# Patient Record
Sex: Female | Born: 1970 | Race: White | Hispanic: No | Marital: Married | Smoking: Former smoker
Health system: Southern US, Community
[De-identification: ages and names within clinical notes are randomized; demographics above are authoritative.]

## PROBLEM LIST (undated history)

## (undated) DIAGNOSIS — I471 Supraventricular tachycardia, unspecified: Secondary | ICD-10-CM

## (undated) DIAGNOSIS — M79641 Pain in right hand: Secondary | ICD-10-CM

## (undated) DIAGNOSIS — F419 Anxiety disorder, unspecified: Secondary | ICD-10-CM

## (undated) DIAGNOSIS — M4800 Spinal stenosis, site unspecified: Secondary | ICD-10-CM

## (undated) DIAGNOSIS — M1990 Unspecified osteoarthritis, unspecified site: Secondary | ICD-10-CM

## (undated) DIAGNOSIS — M25562 Pain in left knee: Secondary | ICD-10-CM

## (undated) DIAGNOSIS — G43119 Migraine with aura, intractable, without status migrainosus: Secondary | ICD-10-CM

## (undated) DIAGNOSIS — R5382 Chronic fatigue, unspecified: Secondary | ICD-10-CM

## (undated) DIAGNOSIS — M25561 Pain in right knee: Secondary | ICD-10-CM

## (undated) DIAGNOSIS — E119 Type 2 diabetes mellitus without complications: Secondary | ICD-10-CM

## (undated) DIAGNOSIS — M4646 Discitis, unspecified, lumbar region: Secondary | ICD-10-CM

## (undated) DIAGNOSIS — G8918 Other acute postprocedural pain: Secondary | ICD-10-CM

## (undated) DIAGNOSIS — R42 Dizziness and giddiness: Secondary | ICD-10-CM

## (undated) DIAGNOSIS — G4730 Sleep apnea, unspecified: Secondary | ICD-10-CM

## (undated) DIAGNOSIS — M797 Fibromyalgia: Secondary | ICD-10-CM

## (undated) DIAGNOSIS — R600 Localized edema: Secondary | ICD-10-CM

## (undated) DIAGNOSIS — E559 Vitamin D deficiency, unspecified: Secondary | ICD-10-CM

## (undated) DIAGNOSIS — M719 Bursopathy, unspecified: Secondary | ICD-10-CM

## (undated) DIAGNOSIS — G43909 Migraine, unspecified, not intractable, without status migrainosus: Secondary | ICD-10-CM

## (undated) DIAGNOSIS — G9332 Myalgic encephalomyelitis/chronic fatigue syndrome: Secondary | ICD-10-CM

## (undated) DIAGNOSIS — K219 Gastro-esophageal reflux disease without esophagitis: Secondary | ICD-10-CM

## (undated) DIAGNOSIS — K589 Irritable bowel syndrome without diarrhea: Secondary | ICD-10-CM

## (undated) HISTORY — DX: Pain in left knee: M25.562

## (undated) HISTORY — DX: Supraventricular tachycardia, unspecified: I47.10

## (undated) HISTORY — DX: Supraventricular tachycardia: I47.1

## (undated) HISTORY — DX: Irritable bowel syndrome, unspecified: K58.9

## (undated) HISTORY — PX: KNEE ARTHROSCOPY: SUR90

## (undated) HISTORY — DX: Anxiety disorder, unspecified: F41.9

## (undated) HISTORY — DX: Pain in right hand: M79.641

## (undated) HISTORY — DX: Bursopathy, unspecified: M71.9

## (undated) HISTORY — DX: Discitis, unspecified, lumbar region: M46.46

## (undated) HISTORY — DX: Fibromyalgia: M79.7

## (undated) HISTORY — DX: Spinal stenosis, site unspecified: M48.00

## (undated) HISTORY — PX: CARDIAC CATHETERIZATION: SHX172

## (undated) HISTORY — DX: Localized edema: R60.0

## (undated) HISTORY — PX: ABLATION: SHX5711

## (undated) HISTORY — DX: Dizziness and giddiness: R42

## (undated) HISTORY — DX: Vitamin D deficiency, unspecified: E55.9

## (undated) HISTORY — DX: Migraine, unspecified, not intractable, without status migrainosus: G43.909

## (undated) HISTORY — DX: Gastro-esophageal reflux disease without esophagitis: K21.9

## (undated) HISTORY — DX: Sleep apnea, unspecified: G47.30

## (undated) HISTORY — DX: Pain in right knee: M25.561

## (undated) HISTORY — DX: Unspecified osteoarthritis, unspecified site: M19.90

## (undated) HISTORY — PX: OTHER SURGICAL HISTORY: SHX169

## (undated) HISTORY — DX: Migraine with aura, intractable, without status migrainosus: G43.119

## (undated) HISTORY — DX: Chronic fatigue, unspecified: R53.82

## (undated) HISTORY — PX: BREAST BIOPSY: SHX20

## (undated) HISTORY — DX: Myalgic encephalomyelitis/chronic fatigue syndrome: G93.32

## (undated) HISTORY — DX: Other acute postprocedural pain: G89.18

---

## 1996-09-30 HISTORY — PX: OTHER SURGICAL HISTORY: SHX169

## 1999-02-06 ENCOUNTER — Emergency Department (HOSPITAL_COMMUNITY): Admission: EM | Admit: 1999-02-06 | Discharge: 1999-02-06 | Payer: Self-pay | Admitting: Emergency Medicine

## 1999-10-01 HISTORY — PX: TUBAL LIGATION: SHX77

## 2002-08-16 ENCOUNTER — Encounter: Payer: Self-pay | Admitting: Emergency Medicine

## 2002-08-16 ENCOUNTER — Emergency Department (HOSPITAL_COMMUNITY): Admission: EM | Admit: 2002-08-16 | Discharge: 2002-08-16 | Payer: Self-pay | Admitting: Emergency Medicine

## 2004-09-04 ENCOUNTER — Emergency Department (HOSPITAL_COMMUNITY): Admission: EM | Admit: 2004-09-04 | Discharge: 2004-09-04 | Payer: Self-pay | Admitting: Family Medicine

## 2004-09-10 ENCOUNTER — Ambulatory Visit (HOSPITAL_COMMUNITY): Admission: RE | Admit: 2004-09-10 | Discharge: 2004-09-10 | Payer: Self-pay

## 2004-11-25 ENCOUNTER — Emergency Department (HOSPITAL_COMMUNITY)
Admission: EM | Admit: 2004-11-25 | Discharge: 2004-11-25 | Payer: No Typology Code available for payment source | Admitting: Family Medicine

## 2005-02-07 ENCOUNTER — Ambulatory Visit: Payer: Self-pay | Admitting: Orthopedic Surgery

## 2005-02-21 ENCOUNTER — Encounter: Admission: RE | Admit: 2005-02-21 | Discharge: 2005-02-21 | Payer: Self-pay | Admitting: Orthopedic Surgery

## 2005-03-13 ENCOUNTER — Encounter: Admission: RE | Admit: 2005-03-13 | Discharge: 2005-03-13 | Payer: Self-pay | Admitting: Orthopedic Surgery

## 2005-03-21 ENCOUNTER — Ambulatory Visit: Payer: Self-pay | Admitting: Orthopedic Surgery

## 2005-03-28 ENCOUNTER — Encounter: Admission: RE | Admit: 2005-03-28 | Discharge: 2005-03-28 | Payer: Self-pay | Admitting: Orthopedic Surgery

## 2005-04-11 ENCOUNTER — Ambulatory Visit: Payer: Self-pay | Admitting: Orthopedic Surgery

## 2006-07-04 ENCOUNTER — Encounter: Admission: RE | Admit: 2006-07-04 | Discharge: 2006-07-04 | Payer: Self-pay | Admitting: Internal Medicine

## 2006-08-07 ENCOUNTER — Encounter: Admission: RE | Admit: 2006-08-07 | Discharge: 2006-08-07 | Payer: Self-pay | Admitting: Internal Medicine

## 2006-09-02 ENCOUNTER — Encounter: Admission: RE | Admit: 2006-09-02 | Discharge: 2006-09-02 | Payer: Self-pay | Admitting: Internal Medicine

## 2007-05-06 ENCOUNTER — Emergency Department (HOSPITAL_COMMUNITY): Admission: EM | Admit: 2007-05-06 | Discharge: 2007-05-06 | Payer: Self-pay | Admitting: Emergency Medicine

## 2007-05-15 DIAGNOSIS — R51 Headache: Secondary | ICD-10-CM

## 2007-05-15 DIAGNOSIS — R519 Headache, unspecified: Secondary | ICD-10-CM | POA: Insufficient documentation

## 2008-06-13 ENCOUNTER — Ambulatory Visit: Payer: Self-pay | Admitting: Nurse Practitioner

## 2008-06-13 DIAGNOSIS — K219 Gastro-esophageal reflux disease without esophagitis: Secondary | ICD-10-CM

## 2008-06-17 DIAGNOSIS — G4733 Obstructive sleep apnea (adult) (pediatric): Secondary | ICD-10-CM | POA: Insufficient documentation

## 2008-06-17 DIAGNOSIS — Z8679 Personal history of other diseases of the circulatory system: Secondary | ICD-10-CM | POA: Insufficient documentation

## 2008-06-17 DIAGNOSIS — R42 Dizziness and giddiness: Secondary | ICD-10-CM | POA: Insufficient documentation

## 2008-06-20 ENCOUNTER — Encounter (INDEPENDENT_AMBULATORY_CARE_PROVIDER_SITE_OTHER): Payer: Self-pay | Admitting: Nurse Practitioner

## 2008-06-23 ENCOUNTER — Encounter (INDEPENDENT_AMBULATORY_CARE_PROVIDER_SITE_OTHER): Payer: Self-pay | Admitting: Nurse Practitioner

## 2008-06-23 DIAGNOSIS — I10 Essential (primary) hypertension: Secondary | ICD-10-CM | POA: Insufficient documentation

## 2008-06-23 DIAGNOSIS — F410 Panic disorder [episodic paroxysmal anxiety] without agoraphobia: Secondary | ICD-10-CM

## 2008-12-21 ENCOUNTER — Ambulatory Visit: Payer: Self-pay | Admitting: Nurse Practitioner

## 2008-12-21 ENCOUNTER — Ambulatory Visit (HOSPITAL_COMMUNITY): Admission: RE | Admit: 2008-12-21 | Discharge: 2008-12-21 | Payer: Self-pay | Admitting: Nurse Practitioner

## 2008-12-21 DIAGNOSIS — M25561 Pain in right knee: Secondary | ICD-10-CM

## 2008-12-21 DIAGNOSIS — M25562 Pain in left knee: Secondary | ICD-10-CM

## 2008-12-21 HISTORY — DX: Pain in right knee: M25.561

## 2008-12-21 HISTORY — DX: Pain in right knee: M25.562

## 2008-12-23 ENCOUNTER — Encounter (INDEPENDENT_AMBULATORY_CARE_PROVIDER_SITE_OTHER): Payer: Self-pay | Admitting: Nurse Practitioner

## 2009-01-12 ENCOUNTER — Ambulatory Visit: Payer: Self-pay | Admitting: Nurse Practitioner

## 2009-01-12 ENCOUNTER — Other Ambulatory Visit: Admission: RE | Admit: 2009-01-12 | Discharge: 2009-01-12 | Payer: Self-pay | Admitting: Internal Medicine

## 2009-01-12 ENCOUNTER — Encounter (INDEPENDENT_AMBULATORY_CARE_PROVIDER_SITE_OTHER): Payer: Self-pay | Admitting: Nurse Practitioner

## 2009-01-12 DIAGNOSIS — R079 Chest pain, unspecified: Secondary | ICD-10-CM

## 2009-01-12 DIAGNOSIS — F3340 Major depressive disorder, recurrent, in remission, unspecified: Secondary | ICD-10-CM

## 2009-01-12 LAB — CONVERTED CEMR LAB

## 2009-01-13 ENCOUNTER — Encounter (INDEPENDENT_AMBULATORY_CARE_PROVIDER_SITE_OTHER): Payer: Self-pay | Admitting: Nurse Practitioner

## 2009-01-20 ENCOUNTER — Encounter: Admission: RE | Admit: 2009-01-20 | Discharge: 2009-01-20 | Payer: Self-pay | Admitting: Internal Medicine

## 2009-01-24 ENCOUNTER — Telehealth (INDEPENDENT_AMBULATORY_CARE_PROVIDER_SITE_OTHER): Payer: Self-pay | Admitting: Nurse Practitioner

## 2009-01-30 ENCOUNTER — Ambulatory Visit: Payer: Self-pay | Admitting: Nurse Practitioner

## 2009-01-30 DIAGNOSIS — N943 Premenstrual tension syndrome: Secondary | ICD-10-CM | POA: Insufficient documentation

## 2009-01-31 ENCOUNTER — Encounter: Admission: RE | Admit: 2009-01-31 | Discharge: 2009-01-31 | Payer: Self-pay | Admitting: Internal Medicine

## 2009-02-02 ENCOUNTER — Encounter (INDEPENDENT_AMBULATORY_CARE_PROVIDER_SITE_OTHER): Payer: Self-pay | Admitting: Nurse Practitioner

## 2009-06-09 ENCOUNTER — Telehealth (INDEPENDENT_AMBULATORY_CARE_PROVIDER_SITE_OTHER): Payer: Self-pay | Admitting: *Deleted

## 2009-06-09 ENCOUNTER — Ambulatory Visit: Payer: Self-pay | Admitting: Internal Medicine

## 2009-06-09 DIAGNOSIS — L259 Unspecified contact dermatitis, unspecified cause: Secondary | ICD-10-CM | POA: Insufficient documentation

## 2009-06-15 ENCOUNTER — Ambulatory Visit: Payer: Self-pay | Admitting: Nurse Practitioner

## 2009-06-15 DIAGNOSIS — N760 Acute vaginitis: Secondary | ICD-10-CM | POA: Insufficient documentation

## 2009-06-15 DIAGNOSIS — R3129 Other microscopic hematuria: Secondary | ICD-10-CM | POA: Insufficient documentation

## 2009-06-15 LAB — CONVERTED CEMR LAB
Bilirubin Urine: NEGATIVE
Blood Glucose, Fingerstick: 104
Glucose, Urine, Semiquant: NEGATIVE
Ketones, urine, test strip: NEGATIVE
Specific Gravity, Urine: 1.01

## 2009-06-16 ENCOUNTER — Encounter (INDEPENDENT_AMBULATORY_CARE_PROVIDER_SITE_OTHER): Payer: Self-pay | Admitting: Nurse Practitioner

## 2009-06-22 ENCOUNTER — Ambulatory Visit: Payer: Self-pay | Admitting: Nurse Practitioner

## 2009-06-30 ENCOUNTER — Encounter (INDEPENDENT_AMBULATORY_CARE_PROVIDER_SITE_OTHER): Payer: Self-pay | Admitting: Nurse Practitioner

## 2009-06-30 ENCOUNTER — Ambulatory Visit: Payer: Self-pay | Admitting: Physician Assistant

## 2009-06-30 DIAGNOSIS — J069 Acute upper respiratory infection, unspecified: Secondary | ICD-10-CM | POA: Insufficient documentation

## 2009-06-30 LAB — CONVERTED CEMR LAB: Rapid Strep: NEGATIVE

## 2009-08-18 ENCOUNTER — Ambulatory Visit: Payer: Self-pay | Admitting: Internal Medicine

## 2009-08-18 ENCOUNTER — Telehealth (INDEPENDENT_AMBULATORY_CARE_PROVIDER_SITE_OTHER): Payer: Self-pay | Admitting: Internal Medicine

## 2009-08-18 DIAGNOSIS — J019 Acute sinusitis, unspecified: Secondary | ICD-10-CM | POA: Insufficient documentation

## 2009-09-01 ENCOUNTER — Encounter: Admission: RE | Admit: 2009-09-01 | Discharge: 2009-09-01 | Payer: Self-pay | Admitting: Internal Medicine

## 2009-12-12 ENCOUNTER — Encounter (INDEPENDENT_AMBULATORY_CARE_PROVIDER_SITE_OTHER): Payer: Self-pay | Admitting: Internal Medicine

## 2010-03-28 ENCOUNTER — Encounter: Admission: RE | Admit: 2010-03-28 | Discharge: 2010-04-13 | Payer: Self-pay | Admitting: Family Medicine

## 2010-04-23 ENCOUNTER — Observation Stay (HOSPITAL_COMMUNITY): Admission: EM | Admit: 2010-04-23 | Discharge: 2010-04-23 | Payer: Self-pay | Admitting: Emergency Medicine

## 2010-06-21 ENCOUNTER — Encounter (INDEPENDENT_AMBULATORY_CARE_PROVIDER_SITE_OTHER): Payer: Self-pay | Admitting: Internal Medicine

## 2010-10-28 LAB — CONVERTED CEMR LAB
Albumin: 4.4 g/dL (ref 3.5–5.2)
Basophils Relative: 0 % (ref 0–1)
CO2: 24 meq/L (ref 19–32)
Chlamydia, DNA Probe: NEGATIVE
Cholesterol: 200 mg/dL (ref 0–200)
Glucose, Bld: 88 mg/dL (ref 70–99)
HCT: 44.9 % (ref 36.0–46.0)
Hemoglobin: 14.9 g/dL (ref 12.0–15.0)
LDL Cholesterol: 101 mg/dL — ABNORMAL HIGH (ref 0–99)
Lymphocytes Relative: 10 % — ABNORMAL LOW (ref 12–46)
MCHC: 33.2 g/dL (ref 30.0–36.0)
MCV: 93 fL (ref 78.0–100.0)
Monocytes Absolute: 1.1 10*3/uL — ABNORMAL HIGH (ref 0.1–1.0)
Monocytes Relative: 8 % (ref 3–12)
Neutro Abs: 10.9 10*3/uL — ABNORMAL HIGH (ref 1.7–7.7)
Neutrophils Relative %: 80 % — ABNORMAL HIGH (ref 43–77)
Nitrite: NEGATIVE
RBC: 4.83 M/uL (ref 3.87–5.11)
Sodium: 141 meq/L (ref 135–145)
Specific Gravity, Urine: 1.015
Total Bilirubin: 0.6 mg/dL (ref 0.3–1.2)
Total Protein: 7.5 g/dL (ref 6.0–8.3)
Triglycerides: 249 mg/dL — ABNORMAL HIGH (ref <150)
VLDL: 50 mg/dL — ABNORMAL HIGH (ref 0–40)
WBC Urine, dipstick: NEGATIVE
WBC: 13.6 10*3/uL — ABNORMAL HIGH (ref 4.0–10.5)

## 2010-10-30 NOTE — Letter (Signed)
Summary: [facility]//DURABLE MEDICAL EQUIPMENT  [facility]//DURABLE MEDICAL EQUIPMENT   Imported By: Arta Bruce 06/21/2010 09:56:34  _____________________________________________________________________  External Attachment:    Type:   Image     Comment:   External Document

## 2010-10-30 NOTE — Letter (Signed)
Summary: DURABLE MEDICAL EQUIPMENT  DURABLE MEDICAL EQUIPMENT   Imported By: Arta Bruce 02/12/2010 15:54:54  _____________________________________________________________________  External Attachment:    Type:   Image     Comment:   External Document

## 2010-12-15 LAB — URINALYSIS, ROUTINE W REFLEX MICROSCOPIC
Bilirubin Urine: NEGATIVE
Ketones, ur: NEGATIVE mg/dL
Nitrite: NEGATIVE
Urobilinogen, UA: 0.2 mg/dL (ref 0.0–1.0)

## 2010-12-15 LAB — PROTIME-INR: Prothrombin Time: 13.2 seconds (ref 11.6–15.2)

## 2010-12-15 LAB — DIFFERENTIAL
Eosinophils Relative: 1 % (ref 0–5)
Lymphocytes Relative: 18 % (ref 12–46)
Lymphs Abs: 1.4 10*3/uL (ref 0.7–4.0)
Neutro Abs: 6 10*3/uL (ref 1.7–7.7)

## 2010-12-15 LAB — POCT I-STAT, CHEM 8
Chloride: 105 mEq/L (ref 96–112)
Glucose, Bld: 105 mg/dL — ABNORMAL HIGH (ref 70–99)
HCT: 45 % (ref 36.0–46.0)
Hemoglobin: 15.3 g/dL — ABNORMAL HIGH (ref 12.0–15.0)
Potassium: 3.5 mEq/L (ref 3.5–5.1)
Sodium: 141 mEq/L (ref 135–145)

## 2010-12-15 LAB — CBC
HCT: 42.3 % (ref 36.0–46.0)
MCV: 92.6 fL (ref 78.0–100.0)
Platelets: 246 10*3/uL (ref 150–400)
RBC: 4.57 MIL/uL (ref 3.87–5.11)
WBC: 8 10*3/uL (ref 4.0–10.5)

## 2010-12-15 LAB — URINE MICROSCOPIC-ADD ON

## 2010-12-15 LAB — POCT PREGNANCY, URINE: Preg Test, Ur: NEGATIVE

## 2011-07-15 LAB — RAPID URINE DRUG SCREEN, HOSP PERFORMED
Amphetamines: NOT DETECTED
Barbiturates: NOT DETECTED
Benzodiazepines: NOT DETECTED
Cocaine: NOT DETECTED
Opiates: NOT DETECTED
Tetrahydrocannabinol: NOT DETECTED

## 2011-07-15 LAB — DIFFERENTIAL
Basophils Absolute: 0
Basophils Relative: 0
Eosinophils Absolute: 0.1
Eosinophils Relative: 1
Lymphocytes Relative: 25
Lymphs Abs: 2.4
Monocytes Absolute: 0.9 — ABNORMAL HIGH
Monocytes Relative: 9
Neutro Abs: 6.2
Neutrophils Relative %: 65

## 2011-07-15 LAB — URINALYSIS, ROUTINE W REFLEX MICROSCOPIC
Bilirubin Urine: NEGATIVE
Glucose, UA: NEGATIVE
Hgb urine dipstick: NEGATIVE
Ketones, ur: NEGATIVE
Leukocytes, UA: NEGATIVE
Nitrite: NEGATIVE
Protein, ur: 30 — AB
Specific Gravity, Urine: 1.018
Urobilinogen, UA: 1
pH: 8.5 — ABNORMAL HIGH

## 2011-07-15 LAB — URINE MICROSCOPIC-ADD ON

## 2011-07-15 LAB — COMPREHENSIVE METABOLIC PANEL
ALT: 24
AST: 22
Albumin: 4.3
Alkaline Phosphatase: 80
BUN: 8
CO2: 25
Calcium: 10
Chloride: 109
Creatinine, Ser: 0.71
GFR calc Af Amer: >60
GFR calc non Af Amer: >60
Glucose, Bld: 94
Potassium: 3.2 — ABNORMAL LOW
Sodium: 141
Total Bilirubin: 0.7
Total Protein: 7.6

## 2011-07-15 LAB — CBC
HCT: 42.3
Hemoglobin: 14.8
MCHC: 35.1
MCV: 87.3
Platelets: 293
RBC: 4.84
RDW: 12.4
WBC: 9.6

## 2011-07-15 LAB — ETHANOL: Alcohol, Ethyl (B): <5

## 2011-07-15 LAB — POCT CARDIAC MARKERS
CKMB, poc: <1 — ABNORMAL LOW
Myoglobin, poc: 48.2
Operator id: 4295
Troponin i, poc: <0.05

## 2012-12-10 ENCOUNTER — Encounter: Payer: Self-pay | Admitting: Family Medicine

## 2012-12-10 ENCOUNTER — Ambulatory Visit (INDEPENDENT_AMBULATORY_CARE_PROVIDER_SITE_OTHER): Payer: Commercial Indemnity | Admitting: Family Medicine

## 2012-12-10 VITALS — BP 149/94 | HR 95 | Resp 16 | Ht 63.0 in | Wt 202.0 lb

## 2012-12-10 DIAGNOSIS — Z124 Encounter for screening for malignant neoplasm of cervix: Secondary | ICD-10-CM

## 2012-12-10 MED ORDER — OMEPRAZOLE 40 MG PO CPDR: 40.0000 mg | DELAYED_RELEASE_CAPSULE | Freq: Every day | ORAL | Status: DC

## 2012-12-10 MED ORDER — METOPROLOL SUCCINATE ER 50 MG PO TB24: 50.0000 mg | ORAL_TABLET | Freq: Every day | ORAL | Status: DC

## 2012-12-10 MED ORDER — TIZANIDINE HCL 2 MG PO TABS: 2.0000 mg | ORAL_TABLET | Freq: Four times a day (QID) | ORAL | Status: DC | PRN

## 2012-12-10 MED ORDER — AMITRIPTYLINE HCL 25 MG PO TABS: 25.0000 mg | ORAL_TABLET | Freq: Every day | ORAL | Status: DC

## 2012-12-10 MED ORDER — HYDROCODONE-ACETAMINOPHEN 5-325 MG PO TABS: 1.0000 | ORAL_TABLET | Freq: Four times a day (QID) | ORAL | Status: DC | PRN

## 2012-12-10 NOTE — Assessment & Plan Note (Signed)
U/S, check CBC, TSH--needs [facility] on return.

## 2012-12-10 NOTE — Patient Instructions (Addendum)
Preventive Care for Adults, Female A healthy lifestyle and preventive care can promote health and wellness. Preventive health guidelines for women include the following key practices.  A routine yearly physical is a good way to check with your caregiver about your health and preventive screening. It is a chance to share any concerns and updates on your health, and to receive a thorough exam.  Visit your dentist for a routine exam and preventive care every 6 months. Brush your teeth twice a day and floss once a day. Good oral hygiene prevents tooth decay and gum disease.  The frequency of eye exams is based on your age, health, family medical history, use of contact lenses, and other factors. Follow your caregiver's recommendations for frequency of eye exams.  Eat a healthy diet. Foods like vegetables, fruits, whole grains, low-fat dairy products, and lean protein foods contain the nutrients you need without too many calories. Decrease your intake of foods high in solid fats, added sugars, and salt. Eat the right amount of calories for you.Get information about a proper diet from your caregiver, if necessary.  Regular physical exercise is one of the most important things you can do for your health. Most adults should get at least 150 minutes of moderate-intensity exercise (any activity that increases your heart rate and causes you to sweat) each week. In addition, most adults need muscle-strengthening exercises on 2 or more days a week.  Maintain a healthy weight. The body mass index (BMI) is a screening tool to identify possible weight problems. It provides an estimate of body fat based on height and weight. Your caregiver can help determine your BMI, and can help you achieve or maintain a healthy weight.For adults 20 years and older:  A BMI below 18.5 is considered underweight.  A BMI of 18.5 to 24.9 is normal.  A BMI of 25 to 29.9 is considered overweight.  A BMI of 30 and above is  considered obese.  Maintain normal blood lipids and cholesterol levels by exercising and minimizing your intake of saturated fat. Eat a balanced diet with plenty of fruit and vegetables. Blood tests for lipids and cholesterol should begin at age 20 and be repeated every 5 years. If your lipid or cholesterol levels are high, you are over 50, or you are at high risk for heart disease, you may need your cholesterol levels checked more frequently.Ongoing high lipid and cholesterol levels should be treated with medicines if diet and exercise are not effective.  If you smoke, find out from your caregiver how to quit. If you do not use tobacco, do not start.  If you are pregnant, do not drink alcohol. If you are breastfeeding, be very cautious about drinking alcohol. If you are not pregnant and choose to drink alcohol, do not exceed 1 drink per day. One drink is considered to be 12 ounces (355 mL) of beer, 5 ounces (148 mL) of wine, or 1.5 ounces (44 mL) of liquor.  Avoid use of street drugs. Do not share needles with anyone. Ask for help if you need support or instructions about stopping the use of drugs.  High blood pressure causes heart disease and increases the risk of stroke. Your blood pressure should be checked at least every 1 to 2 years. Ongoing high blood pressure should be treated with medicines if weight loss and exercise are not effective.  If you are 55 to 42 years old, ask your caregiver if you should take aspirin to prevent strokes.  Diabetes   screening involves taking a blood sample to check your fasting blood sugar level. This should be done once every 3 years, after age 45, if you are within normal weight and without risk factors for diabetes. Testing should be considered at a younger age or be carried out more frequently if you are overweight and have at least 1 risk factor for diabetes.  Breast cancer screening is essential preventive care for women. You should practice "breast  self-awareness." This means understanding the normal appearance and feel of your breasts and may include breast self-examination. Any changes detected, no matter how small, should be reported to a caregiver. Women in their 20s and 30s should have a clinical breast exam (CBE) by a caregiver as part of a regular health exam every 1 to 3 years. After age 40, women should have a CBE every year. Starting at age 40, women should consider having a mammography (breast X-ray test) every year. Women who have a family history of breast cancer should talk to their caregiver about genetic screening. Women at a high risk of breast cancer should talk to their caregivers about having magnetic resonance imaging (MRI) and a mammography every year.  The Pap test is a screening test for cervical cancer. A Pap test can show cell changes on the cervix that might become cervical cancer if left untreated. A Pap test is a procedure in which cells are obtained and examined from the lower end of the uterus (cervix).  Women should have a Pap test starting at age 21.  Between ages 21 and 29, Pap tests should be repeated every 2 years.  Beginning at age 30, you should have a Pap test every 3 years as long as the past 3 Pap tests have been normal.  Some women have medical problems that increase the chance of getting cervical cancer. Talk to your caregiver about these problems. It is especially important to talk to your caregiver if a new problem develops soon after your last Pap test. In these cases, your caregiver may recommend more frequent screening and Pap tests.  The above recommendations are the same for women who have or have not gotten the vaccine for human papillomavirus (HPV).  If you had a hysterectomy for a problem that was not cancer or a condition that could lead to cancer, then you no longer need Pap tests. Even if you no longer need a Pap test, a regular exam is a good idea to make sure no other problems are  starting.  If you are between ages 65 and 70, and you have had normal Pap tests going back 10 years, you no longer need Pap tests. Even if you no longer need a Pap test, a regular exam is a good idea to make sure no other problems are starting.  If you have had past treatment for cervical cancer or a condition that could lead to cancer, you need Pap tests and screening for cancer for at least 20 years after your treatment.  If Pap tests have been discontinued, risk factors (such as a new sexual partner) need to be reassessed to determine if screening should be resumed.  The HPV test is an additional test that may be used for cervical cancer screening. The HPV test looks for the virus that can cause the cell changes on the cervix. The cells collected during the Pap test can be tested for HPV. The HPV test could be used to screen women aged 30 years and older, and should   be used in women of any age who have unclear Pap test results. After the age of 30, women should have HPV testing at the same frequency as a Pap test.  Colorectal cancer can be detected and often prevented. Most routine colorectal cancer screening begins at the age of 50 and continues through age 75. However, your caregiver may recommend screening at an earlier age if you have risk factors for colon cancer. On a yearly basis, your caregiver may provide home test kits to check for hidden blood in the stool. Use of a small camera at the end of a tube, to directly examine the colon (sigmoidoscopy or colonoscopy), can detect the earliest forms of colorectal cancer. Talk to your caregiver about this at age 50, when routine screening begins. Direct examination of the colon should be repeated every 5 to 10 years through age 75, unless early forms of pre-cancerous polyps or small growths are found.  Hepatitis C blood testing is recommended for all people born from 1945 through 1965 and any individual with known risks for hepatitis C.  Practice  safe sex. Use condoms and avoid high-risk sexual practices to reduce the spread of sexually transmitted infections (STIs). STIs include gonorrhea, chlamydia, syphilis, trichomonas, herpes, HPV, and human immunodeficiency virus (HIV). Herpes, HIV, and HPV are viral illnesses that have no cure. They can result in disability, cancer, and death. Sexually active women aged 25 and younger should be checked for chlamydia. Older women with new or multiple partners should also be tested for chlamydia. Testing for other STIs is recommended if you are sexually active and at increased risk.  Osteoporosis is a disease in which the bones lose minerals and strength with aging. This can result in serious bone fractures. The risk of osteoporosis can be identified using a bone density scan. Women ages 65 and over and women at risk for fractures or osteoporosis should discuss screening with their caregivers. Ask your caregiver whether you should take a calcium supplement or vitamin D to reduce the rate of osteoporosis.  Menopause can be associated with physical symptoms and risks. Hormone replacement therapy is available to decrease symptoms and risks. You should talk to your caregiver about whether hormone replacement therapy is right for you.  Use sunscreen with sun protection factor (SPF) of 30 or more. Apply sunscreen liberally and repeatedly throughout the day. You should seek shade when your shadow is shorter than you. Protect yourself by wearing long sleeves, pants, a wide-brimmed hat, and sunglasses year round, whenever you are outdoors.  Once a month, do a whole body skin exam, using a mirror to look at the skin on your back. Notify your caregiver of new moles, moles that have irregular borders, moles that are larger than a pencil eraser, or moles that have changed in shape or color.  Stay current with required immunizations.  Influenza. You need a dose every fall (or winter). The composition of the flu vaccine  changes each year, so being vaccinated once is not enough.  Pneumococcal polysaccharide. You need 1 to 2 doses if you smoke cigarettes or if you have certain chronic medical conditions. You need 1 dose at age 65 (or older) if you have never been vaccinated.  Tetanus, diphtheria, pertussis (Tdap, Td). Get 1 dose of Tdap vaccine if you are younger than age 65, are over 65 and have contact with an infant, are a healthcare worker, are pregnant, or simply want to be protected from whooping cough. After that, you need a Td   booster dose every 10 years. Consult your caregiver if you have not had at least 3 tetanus and diphtheria-containing shots sometime in your life or have a deep or dirty wound.  HPV. You need this vaccine if you are a woman age 26 or younger. The vaccine is given in 3 doses over 6 months.  Measles, mumps, rubella (MMR). You need at least 1 dose of MMR if you were born in 1957 or later. You may also need a second dose.  Meningococcal. If you are age 19 to 21 and a first-year college student living in a residence hall, or have one of several medical conditions, you need to get vaccinated against meningococcal disease. You may also need additional booster doses.  Zoster (shingles). If you are age 60 or older, you should get this vaccine.  Varicella (chickenpox). If you have never had chickenpox or you were vaccinated but received only 1 dose, talk to your caregiver to find out if you need this vaccine.  Hepatitis A. You need this vaccine if you have a specific risk factor for hepatitis A virus infection or you simply wish to be protected from this disease. The vaccine is usually given as 2 doses, 6 to 18 months apart.  Hepatitis B. You need this vaccine if you have a specific risk factor for hepatitis B virus infection or you simply wish to be protected from this disease. The vaccine is given in 3 doses, usually over 6 months. Preventive Services / Frequency Ages 19 to 39  Blood  pressure check.** / Every 1 to 2 years.  Lipid and cholesterol check.** / Every 5 years beginning at age 20.  Clinical breast exam.** / Every 3 years for women in their 20s and 30s.  Pap test.** / Every 2 years from ages 21 through 29. Every 3 years starting at age 30 through age 65 or 70 with a history of 3 consecutive normal Pap tests.  HPV screening.** / Every 3 years from ages 30 through ages 65 to 70 with a history of 3 consecutive normal Pap tests.  Hepatitis C blood test.** / For any individual with known risks for hepatitis C.  Skin self-exam. / Monthly.  Influenza immunization.** / Every year.  Pneumococcal polysaccharide immunization.** / 1 to 2 doses if you smoke cigarettes or if you have certain chronic medical conditions.  Tetanus, diphtheria, pertussis (Tdap, Td) immunization. / A one-time dose of Tdap vaccine. After that, you need a Td booster dose every 10 years.  HPV immunization. / 3 doses over 6 months, if you are 26 and younger.  Measles, mumps, rubella (MMR) immunization. / You need at least 1 dose of MMR if you were born in 1957 or later. You may also need a second dose.  Meningococcal immunization. / 1 dose if you are age 19 to 21 and a first-year college student living in a residence hall, or have one of several medical conditions, you need to get vaccinated against meningococcal disease. You may also need additional booster doses.  Varicella immunization.** / Consult your caregiver.  Hepatitis A immunization.** / Consult your caregiver. 2 doses, 6 to 18 months apart.  Hepatitis B immunization.** / Consult your caregiver. 3 doses usually over 6 months. Ages 40 to 64  Blood pressure check.** / Every 1 to 2 years.  Lipid and cholesterol check.** / Every 5 years beginning at age 20.  Clinical breast exam.** / Every year after age 40.  Mammogram.** / Every year beginning at age 40   and continuing for as long as you are in good health. Consult with your  caregiver.  Pap test.** / Every 3 years starting at age 30 through age 65 or 70 with a history of 3 consecutive normal Pap tests.  HPV screening.** / Every 3 years from ages 30 through ages 65 to 70 with a history of 3 consecutive normal Pap tests.  Fecal occult blood test (FOBT) of stool. / Every year beginning at age 50 and continuing until age 75. You may not need to do this test if you get a colonoscopy every 10 years.  Flexible sigmoidoscopy or colonoscopy.** / Every 5 years for a flexible sigmoidoscopy or every 10 years for a colonoscopy beginning at age 50 and continuing until age 75.  Hepatitis C blood test.** / For all people born from 1945 through 1965 and any individual with known risks for hepatitis C.  Skin self-exam. / Monthly.  Influenza immunization.** / Every year.  Pneumococcal polysaccharide immunization.** / 1 to 2 doses if you smoke cigarettes or if you have certain chronic medical conditions.  Tetanus, diphtheria, pertussis (Tdap, Td) immunization.** / A one-time dose of Tdap vaccine. After that, you need a Td booster dose every 10 years.  Measles, mumps, rubella (MMR) immunization. / You need at least 1 dose of MMR if you were born in 1957 or later. You may also need a second dose.  Varicella immunization.** / Consult your caregiver.  Meningococcal immunization.** / Consult your caregiver.  Hepatitis A immunization.** / Consult your caregiver. 2 doses, 6 to 18 months apart.  Hepatitis B immunization.** / Consult your caregiver. 3 doses, usually over 6 months. Ages 65 and over  Blood pressure check.** / Every 1 to 2 years.  Lipid and cholesterol check.** / Every 5 years beginning at age 20.  Clinical breast exam.** / Every year after age 40.  Mammogram.** / Every year beginning at age 40 and continuing for as long as you are in good health. Consult with your caregiver.  Pap test.** / Every 3 years starting at age 30 through age 65 or 70 with a 3  consecutive normal Pap tests. Testing can be stopped between 65 and 70 with 3 consecutive normal Pap tests and no abnormal Pap or HPV tests in the past 10 years.  HPV screening.** / Every 3 years from ages 30 through ages 65 or 70 with a history of 3 consecutive normal Pap tests. Testing can be stopped between 65 and 70 with 3 consecutive normal Pap tests and no abnormal Pap or HPV tests in the past 10 years.  Fecal occult blood test (FOBT) of stool. / Every year beginning at age 50 and continuing until age 75. You may not need to do this test if you get a colonoscopy every 10 years.  Flexible sigmoidoscopy or colonoscopy.** / Every 5 years for a flexible sigmoidoscopy or every 10 years for a colonoscopy beginning at age 50 and continuing until age 75.  Hepatitis C blood test.** / For all people born from 1945 through 1965 and any individual with known risks for hepatitis C.  Osteoporosis screening.** / A one-time screening for women ages 65 and over and women at risk for fractures or osteoporosis.  Skin self-exam. / Monthly.  Influenza immunization.** / Every year.  Pneumococcal polysaccharide immunization.** / 1 dose at age 65 (or older) if you have never been vaccinated.  Tetanus, diphtheria, pertussis (Tdap, Td) immunization. / A one-time dose of Tdap vaccine if you are over   65 and have contact with an infant, are a Research scientist (physical sciences), or simply want to be protected from whooping cough. After that, you need a Td booster dose every 10 years.  Varicella immunization.** / Consult your caregiver.  Meningococcal immunization.** / Consult your caregiver.  Hepatitis A immunization.** / Consult your caregiver. 2 doses, 6 to 18 months apart.  Hepatitis B immunization.** / Check with your caregiver. 3 doses, usually over 6 months. ** Family history and personal history of risk and conditions may change your caregiver's recommendations. Document Released: 11/12/2001 Document Revised: 12/09/2011  Document Reviewed: 02/11/2011 [facility] Patient Information 2013 [place], [place]. Menorrhagia Dysfunctional uterine bleeding is different from a normal menstrual period. When periods are heavy or there is more bleeding than is usual for you, it is called menorrhagia. It may be caused by hormonal imbalance, or physical, metabolic, or other problems. Examination is necessary in order that your caregiver may treat treatable causes. If this is a continuing problem, a D&C may be needed. That means that the cervix (the opening of the uterus or womb) is dilated (stretched larger) and the lining of the uterus is scraped out. The tissue scraped out is then examined under a microscope by a specialist (pathologist) to make sure there is nothing of concern that needs further or more extensive treatment. HOME CARE INSTRUCTIONS   If medications were prescribed, take exactly as directed. Do not change or switch medications without consulting your caregiver.  Long term heavy bleeding may result in iron deficiency. Your caregiver may have prescribed iron pills. They help replace the iron your body lost from heavy bleeding. Take exactly as directed. Iron may cause constipation. If this becomes a problem, increase the bran, fruits, and roughage in your diet.  Do not take aspirin or medicines that contain aspirin one week before or during your menstrual period. Aspirin may make the bleeding worse.  If you need to change your sanitary pad or tampon more than once every 2 hours, stay in bed and rest as much as possible until the bleeding stops.  Eat well-balanced meals. Eat foods high in iron. Examples are leafy green vegetables, meat, liver, eggs, and whole grain breads and cereals. Do not try to lose weight until the abnormal bleeding has stopped and your blood iron level is back to normal. SEEK MEDICAL CARE IF:   You need to change your sanitary pad or tampon more than once an hour.  You develop nausea (feeling sick  to your stomach) and vomiting, dizziness, or diarrhea while you are taking your medicine.  You have any problems that may be related to the medicine you are taking. SEEK IMMEDIATE MEDICAL CARE IF:   You have a fever.  You develop chills.  You develop severe bleeding or start to pass blood clots.  You feel dizzy or faint. MAKE SURE YOU:   Understand these instructions.  Will watch your condition.  Will get help right away if you are not doing well or get worse. Document Released: 09/16/2005 Document Revised: 12/09/2011 Document Reviewed: 05/06/2008 [facility] Patient Information 2013 [place], [place].

## 2012-12-10 NOTE — Assessment & Plan Note (Signed)
Refilled meds

## 2012-12-10 NOTE — Assessment & Plan Note (Signed)
Med refill

## 2012-12-10 NOTE — Progress Notes (Signed)
  Subjective:     Dana Bradley is a 42 y.o. female and is here for a comprehensive physical exam. The patient reports problems - menorrhagia, despite HTA about one year ago.  Reports cycles are monthly and heavy, lasting 8 days.  Also, has significant pain.  She reports lump in left breast..  History   Social History  . Marital Status: Married    Spouse Name: N/A    Number of Children: N/A  . Years of Education: N/A   Occupational History  . Not on file.   Social History Main Topics  . Smoking status: Former Smoker -- 4.00 packs/day for 3 years    Types: Cigarettes    Quit date: 12/10/1992  . Smokeless tobacco: Not on file  . Alcohol Use: No  . Drug Use: Not on file  . Sexually Active: Not on file   Other Topics Concern  . Not on file   Social History Narrative  . No narrative on file   Health Maintenance  Topic Date Due  . Influenza Vaccine  1971/09/07  . Pap Smear  06/15/1989  . Tetanus/tdap  10/01/2011    The following portions of the patient's history were reviewed and updated as appropriate: allergies, current medications, past family history, past medical history, past social history, past surgical history and problem list.  Review of Systems Pertinent items are noted in HPI.   Objective:    BP 149/94  Pulse 95  Resp 16  Ht 5\' 3"  (1.6 m)  Wt 202 lb (91.627 kg)  BMI 35.79 kg/m2  LMP 11/18/2012 General appearance: alert, cooperative, appears stated age and mildly obese Head: Normocephalic, without obvious abnormality, atraumatic Neck: no adenopathy, supple, symmetrical, trachea midline and thyroid not enlarged, symmetric, no tenderness/mass/nodules Lungs: clear to auscultation bilaterally Breasts: normal appearance, no masses or tenderness, positive findings: fibrocystic changes and prominent spot at < 0.5 cm in left breast at 5 O'clock Heart: regular rate and rhythm, S1, S2 normal, no murmur, click, rub or gallop Abdomen: soft, non-tender; bowel sounds  normal; no masses,  no organomegaly Pelvic: cervix normal in appearance, external genitalia normal, no adnexal masses or tenderness, no cervical motion tenderness, uterus normal size, shape, and consistency and vagina normal without discharge Extremities: extremities normal, atraumatic, no cyanosis or edema Pulses: 2+ and symmetric Skin: Skin color, texture, turgor normal. No rashes or lesions Lymph nodes: Cervical, supraclavicular, and axillary nodes normal. Neurologic: Grossly normal    Assessment:    Healthy female exam. Menorrhagia-failed OC's, depo, IUD, and HTA in the past.  Breast mass-likely fibrocystic change.     Plan:    Mammogram Pap smear CBC TSH Pelvic U/S Med refill.  See After Visit Summary for Counseling Recommendations

## 2012-12-10 NOTE — Assessment & Plan Note (Signed)
Medication refill

## 2012-12-11 LAB — CBC
HCT: 39.2 % (ref 36.0–46.0)
Hemoglobin: 13.6 g/dL (ref 12.0–15.0)
MCH: 30 pg (ref 26.0–34.0)
MCHC: 34.7 g/dL (ref 30.0–36.0)

## 2012-12-18 ENCOUNTER — Ambulatory Visit (HOSPITAL_COMMUNITY)
Admission: RE | Admit: 2012-12-18 | Discharge: 2012-12-18 | Disposition: A | Discharge status: Home / Self Care | Payer: Commercial Indemnity | Source: Ambulatory Visit | Attending: Family Medicine | Admitting: Family Medicine

## 2012-12-18 DIAGNOSIS — Z1239 Encounter for other screening for malignant neoplasm of breast: Secondary | ICD-10-CM

## 2012-12-18 DIAGNOSIS — N831 Corpus luteum cyst of ovary, unspecified side: Secondary | ICD-10-CM | POA: Insufficient documentation

## 2012-12-18 DIAGNOSIS — Z1231 Encounter for screening mammogram for malignant neoplasm of breast: Secondary | ICD-10-CM | POA: Insufficient documentation

## 2012-12-18 DIAGNOSIS — N920 Excessive and frequent menstruation with regular cycle: Secondary | ICD-10-CM | POA: Insufficient documentation

## 2012-12-23 ENCOUNTER — Other Ambulatory Visit: Payer: Self-pay | Admitting: Family Medicine

## 2012-12-23 DIAGNOSIS — R928 Other abnormal and inconclusive findings on diagnostic imaging of breast: Secondary | ICD-10-CM

## 2013-01-04 ENCOUNTER — Ambulatory Visit
Admission: RE | Admit: 2013-01-04 | Discharge: 2013-01-04 | Disposition: A | Discharge status: Home / Self Care | Payer: Commercial Indemnity | Source: Ambulatory Visit | Attending: Family Medicine | Admitting: Family Medicine

## 2013-01-04 DIAGNOSIS — R928 Other abnormal and inconclusive findings on diagnostic imaging of breast: Secondary | ICD-10-CM

## 2013-01-05 ENCOUNTER — Ambulatory Visit (INDEPENDENT_AMBULATORY_CARE_PROVIDER_SITE_OTHER): Payer: Managed Care, Other (non HMO) | Admitting: Family Medicine

## 2013-01-05 ENCOUNTER — Encounter: Payer: Self-pay | Admitting: Family Medicine

## 2013-01-05 VITALS — BP 135/88 | HR 98 | Ht 63.0 in | Wt 201.0 lb

## 2013-01-05 DIAGNOSIS — N920 Excessive and frequent menstruation with regular cycle: Secondary | ICD-10-CM

## 2013-01-05 DIAGNOSIS — IMO0001 Reserved for inherently not codable concepts without codable children: Secondary | ICD-10-CM

## 2013-01-05 NOTE — Progress Notes (Signed)
  Subjective:    Patient ID: Dana Bradley, female    DOB: 09/02/1971, 42 y.o.   MRN: [id number]  HPI  The patient returns for f/u of menorrhagia.  She has a h/o failing IUD, Depo, OC's, and Thermachoice ablation.  D and C prior to Thermachoice revealed benign polyps.  This was in May of last year.  Reporting cycles that are 8 days long.  Last one in march was 3 days only and she would like to see if this remains her norm. Also, interested in primary care MD.  Has sleep apnea, need titration, and fibromyalgia.  Has tried and failed Lyrica and Cymbalta. Reports fatigue and pain are 2 biggest issues.  Previous PCP has moved away.  Review of Systems  Constitutional: Negative for fever and chills.  HENT: Negative for congestion.   Eyes: Negative for visual disturbance.  Respiratory: Negative for shortness of breath and wheezing.   Gastrointestinal: Negative for abdominal pain.       Objective:   Physical Exam  Constitutional: She appears well-developed and well-nourished. No distress.  HENT:  Head: Normocephalic and atraumatic.  Eyes: No scleral icterus.  Neck: Neck supple.  Cardiovascular: Normal rate.   Pulmonary/Chest: Effort normal.  Abdominal: Soft.          Assessment & Plan:

## 2013-01-05 NOTE — Assessment & Plan Note (Signed)
Declines hysterectomy today, but has failed all other options.  She will watch and see how she is over next several months and hope that she can avoid major surgery.

## 2013-01-05 NOTE — Patient Instructions (Addendum)
Hysterectomy Information  A hysterectomy is a procedure where your uterus is surgically removed. It will no longer be possible to have menstrual periods or to become pregnant. The tubes and ovaries can be removed (bilateral salpingo-oopherectomy) during this surgery as well.  REASONS FOR A HYSTERECTOMY  Persistent, abnormal bleeding.  Lasting (chronic) pelvic pain or infection.  The lining of the uterus (endometrium) starts growing outside the uterus (endometriosis).  The endometrium starts growing in the muscle of the uterus (adenomyosis).  The uterus falls down into the vagina (pelvic organ prolapse).  Symptomatic uterine fibroids.  Precancerous cells.  Cervical cancer or uterine cancer. TYPES OF HYSTERECTOMIES  Supracervical hysterectomy. This type removes the top part of the uterus, but not the cervix.  Total hysterectomy. This type removes the uterus and cervix.  Radical hysterectomy. This type removes the uterus, cervix, and the fibrous tissue that holds the uterus in place in the pelvis (parametrium). WAYS A HYSTERECTOMY CAN BE PERFORMED  Abdominal hysterectomy. A large surgical cut (incision) is made in the abdomen. The uterus is removed through this incision.  Vaginal hysterectomy. An incision is made in the vagina. The uterus is removed through this incision. There are no abdominal incisions.  Conventional laparoscopic hysterectomy. A thin, lighted tube with a camera (laparoscope) is inserted into 3 or 4 small incisions in the abdomen. The uterus is cut into small pieces. The small pieces are removed through the incisions, or they are removed through the vagina.  Laparoscopic assisted vaginal hysterectomy (LAVH). Three or four small incisions are made in the abdomen. Part of the surgery is performed laparoscopically and part vaginally. The uterus is removed through the vagina.  Robot-assisted laparoscopic hysterectomy. A laparoscope is inserted into 3 or 4 small  incisions in the abdomen. A computer-controlled device is used to give the surgeon a 3D image. This allows for more precise movements of surgical instruments. The uterus is cut into small pieces and removed through the incisions or removed through the vagina. RISKS OF HYSTERECTOMY   Bleeding and risk of blood transfusion. Tell your caregiver if you do not want to receive any blood products.  Blood clots in the legs or lung.  Infection.  Injury to surrounding organs.  Anesthesia problems or side effects.  Conversion to an abdominal hysterectomy. WHAT TO EXPECT AFTER A HYSTERECTOMY  You will be given pain medicine.  You will need to have someone with you for the first 3 to 5 days after you go home.  You will need to follow up with your surgeon in 2 to 4 weeks after surgery to evaluate your progress.  You may have early menopause symptoms like hot flashes, night sweats, and insomnia.  If you had a hysterectomy for a problem that was not a cancer or a condition that could lead to cancer, then you no longer need Pap tests. However, even if you no longer need a Pap test, a regular exam is a good idea to make sure no other problems are starting. Document Released: 03/12/2001 Document Revised: 12/09/2011 Document Reviewed: 04/27/2011 [facility] Patient Information 2013 [place], [place]. Fibromyalgia Fibromyalgia is a disorder that is often misunderstood. It is associated with muscular pains and tenderness that comes and goes. It is often associated with fatigue and sleep disturbances. Though it tends to be long-lasting, fibromyalgia is not life-threatening. CAUSES  The exact cause of fibromyalgia is unknown. People with certain gene types are predisposed to developing fibromyalgia and other conditions. Certain factors can play a role as triggers, such  as:  Spine disorders.  Arthritis.  Severe injury (trauma) and other physical stressors.  Emotional stressors. SYMPTOMS   The main symptom  is pain and stiffness in the muscles and joints, which can vary over time.  Sleep and fatigue problems. Other related symptoms may include:  Bowel and bladder problems.  Headaches.  Visual problems.  Problems with odors and noises.  Depression or mood changes.  Painful periods (dysmenorrhea).  Dryness of the skin or eyes. DIAGNOSIS  There are no specific tests for diagnosing fibromyalgia. Patients can be diagnosed accurately from the specific symptoms they have. The diagnosis is made by determining that nothing else is causing the problems. TREATMENT  There is no cure. Management includes medicines and an active, healthy lifestyle. The goal is to enhance physical fitness, decrease pain, and improve sleep. HOME CARE INSTRUCTIONS   Only take over-the-counter or prescription medicines as directed by your caregiver. Sleeping pills, tranquilizers, and pain medicines may make your problems worse.  Low-impact aerobic exercise is very important and advised for treatment. At first, it may seem to make pain worse. Gradually increasing your tolerance will overcome this feeling.  Learning relaxation techniques and how to control stress will help you. Biofeedback, visual imagery, hypnosis, muscle relaxation, yoga, and meditation are all options.  Anti-inflammatory medicines and physical therapy may provide short-term help.  Acupuncture or massage treatments may help.  Take muscle relaxant medicines as suggested by your caregiver.  Avoid stressful situations.  Plan a healthy lifestyle. This includes your diet, sleep, rest, exercise, and friends.  Find and practice a hobby you enjoy.  Join a fibromyalgia support group for interaction, ideas, and sharing advice. This may be helpful. SEEK MEDICAL CARE IF:  You are not having good results or improvement from your treatment. FOR MORE INFORMATION  National Fibromyalgia Association: www.fmaware.org Arthritis Foundation:  www.arthritis.org Document Released: 09/16/2005 Document Revised: 12/09/2011 Document Reviewed: 12/27/2009 [facility] Patient Information 2013 [place], [place].

## 2013-01-05 NOTE — Assessment & Plan Note (Signed)
PCP referral--advised homeopathy and exercise!!!!

## 2013-01-05 NOTE — Progress Notes (Signed)
Here today for endometrial biopsy due to heavy bleeding.  This past period for March was lighter.  Had ultrasound in March 2014.  Had biopsy at [facility] May 2013.  Patient was unsure if she still needed biopsy since she had one less than one year ago.

## 2013-01-06 ENCOUNTER — Other Ambulatory Visit: Payer: Self-pay | Admitting: Family Medicine

## 2013-01-06 DIAGNOSIS — N6320 Unspecified lump in the left breast, unspecified quadrant: Secondary | ICD-10-CM

## 2013-01-12 ENCOUNTER — Ambulatory Visit
Admission: RE | Admit: 2013-01-12 | Discharge: 2013-01-12 | Disposition: A | Discharge status: Home / Self Care | Payer: Managed Care, Other (non HMO) | Source: Ambulatory Visit | Attending: Family Medicine | Admitting: Family Medicine

## 2013-01-12 ENCOUNTER — Other Ambulatory Visit: Payer: Self-pay | Admitting: Family Medicine

## 2013-01-12 DIAGNOSIS — N6320 Unspecified lump in the left breast, unspecified quadrant: Secondary | ICD-10-CM

## 2013-10-07 ENCOUNTER — Ambulatory Visit (HOSPITAL_COMMUNITY)
Admission: RE | Admit: 2013-10-07 | Discharge: 2013-10-07 | Disposition: A | Discharge status: Home / Self Care | Payer: Medicaid Other | Source: Ambulatory Visit | Attending: Pediatrics | Admitting: Pediatrics

## 2013-10-07 ENCOUNTER — Other Ambulatory Visit (HOSPITAL_COMMUNITY): Payer: Self-pay | Admitting: Pediatrics

## 2013-10-07 DIAGNOSIS — R0602 Shortness of breath: Secondary | ICD-10-CM

## 2013-12-29 DIAGNOSIS — K589 Irritable bowel syndrome without diarrhea: Secondary | ICD-10-CM | POA: Insufficient documentation

## 2013-12-29 DIAGNOSIS — M797 Fibromyalgia: Secondary | ICD-10-CM | POA: Insufficient documentation

## 2013-12-29 DIAGNOSIS — M5481 Occipital neuralgia: Secondary | ICD-10-CM | POA: Insufficient documentation

## 2013-12-29 DIAGNOSIS — G43909 Migraine, unspecified, not intractable, without status migrainosus: Secondary | ICD-10-CM | POA: Insufficient documentation

## 2014-01-24 DIAGNOSIS — M51379 Other intervertebral disc degeneration, lumbosacral region without mention of lumbar back pain or lower extremity pain: Secondary | ICD-10-CM | POA: Insufficient documentation

## 2014-01-24 DIAGNOSIS — M5137 Other intervertebral disc degeneration, lumbosacral region: Secondary | ICD-10-CM | POA: Insufficient documentation

## 2014-01-24 DIAGNOSIS — M5136 Other intervertebral disc degeneration, lumbar region: Secondary | ICD-10-CM | POA: Insufficient documentation

## 2014-08-01 ENCOUNTER — Encounter: Payer: Self-pay | Admitting: Family Medicine

## 2014-11-30 ENCOUNTER — Ambulatory Visit: Payer: Self-pay | Admitting: Family Medicine

## 2015-01-19 ENCOUNTER — Emergency Department
Admit: 2015-01-19 | Disposition: A | Discharge status: Home / Self Care | Payer: Self-pay | Admitting: Emergency Medicine

## 2015-01-19 LAB — CBC WITH DIFFERENTIAL/PLATELET
BASOS PCT: 0.7 %
Basophil #: 0.1 10*3/uL (ref 0.0–0.1)
Eosinophil #: 0 10*3/uL (ref 0.0–0.7)
Eosinophil %: 0.4 %
HCT: 43.6 % (ref 35.0–47.0)
HGB: 14.9 g/dL (ref 12.0–16.0)
LYMPHS PCT: 11.7 %
Lymphocyte #: 1.2 10*3/uL (ref 1.0–3.6)
MCH: 31.7 pg (ref 26.0–34.0)
MCHC: 34.2 g/dL (ref 32.0–36.0)
MCV: 93 fL (ref 80–100)
MONOS PCT: 3.1 %
Monocyte #: 0.3 x10 3/mm (ref 0.2–0.9)
NEUTROS PCT: 84.1 %
Neutrophil #: 8.3 10*3/uL — ABNORMAL HIGH (ref 1.4–6.5)
Platelet: 260 10*3/uL (ref 150–440)
RBC: 4.7 10*6/uL (ref 3.80–5.20)
RDW: 13.1 % (ref 11.5–14.5)
WBC: 9.9 10*3/uL (ref 3.6–11.0)

## 2015-01-19 LAB — COMPREHENSIVE METABOLIC PANEL
ALK PHOS: 102 U/L
ANION GAP: 10 (ref 7–16)
Albumin: 4.6 g/dL
BUN: 10 mg/dL
Bilirubin,Total: 0.8 mg/dL
CALCIUM: 9.3 mg/dL
Chloride: 103 mmol/L
Co2: 25 mmol/L
Creatinine: 0.71 mg/dL
EGFR (African American): >60
GLUCOSE: 135 mg/dL — AB
POTASSIUM: 3.9 mmol/L
SGOT(AST): 40 U/L
SGPT (ALT): 50 U/L
Sodium: 138 mmol/L
Total Protein: 8.5 g/dL — ABNORMAL HIGH

## 2015-01-27 ENCOUNTER — Other Ambulatory Visit: Payer: Self-pay | Admitting: Neurology

## 2015-01-27 DIAGNOSIS — M5481 Occipital neuralgia: Secondary | ICD-10-CM

## 2015-01-27 DIAGNOSIS — R441 Visual hallucinations: Secondary | ICD-10-CM

## 2015-01-27 DIAGNOSIS — R442 Other hallucinations: Secondary | ICD-10-CM

## 2015-02-02 ENCOUNTER — Other Ambulatory Visit: Payer: Self-pay | Admitting: Neurology

## 2015-02-02 DIAGNOSIS — R441 Visual hallucinations: Secondary | ICD-10-CM

## 2015-02-02 DIAGNOSIS — M5481 Occipital neuralgia: Secondary | ICD-10-CM

## 2015-02-02 DIAGNOSIS — G44209 Tension-type headache, unspecified, not intractable: Secondary | ICD-10-CM

## 2015-02-02 DIAGNOSIS — R442 Other hallucinations: Secondary | ICD-10-CM

## 2015-02-13 ENCOUNTER — Ambulatory Visit
Admission: RE | Admit: 2015-02-13 | Discharge: 2015-02-13 | Disposition: A | Discharge status: Home / Self Care | Payer: Medicaid Other | Source: Ambulatory Visit | Attending: Neurology | Admitting: Neurology

## 2015-02-13 DIAGNOSIS — R202 Paresthesia of skin: Secondary | ICD-10-CM | POA: Insufficient documentation

## 2015-02-13 DIAGNOSIS — R442 Other hallucinations: Secondary | ICD-10-CM | POA: Diagnosis not present

## 2015-02-13 DIAGNOSIS — R441 Visual hallucinations: Secondary | ICD-10-CM | POA: Insufficient documentation

## 2015-02-13 DIAGNOSIS — G44209 Tension-type headache, unspecified, not intractable: Secondary | ICD-10-CM

## 2015-02-13 DIAGNOSIS — H539 Unspecified visual disturbance: Secondary | ICD-10-CM | POA: Diagnosis present

## 2015-02-13 DIAGNOSIS — M5481 Occipital neuralgia: Secondary | ICD-10-CM

## 2015-04-06 DIAGNOSIS — K625 Hemorrhage of anus and rectum: Secondary | ICD-10-CM | POA: Insufficient documentation

## 2015-04-10 ENCOUNTER — Other Ambulatory Visit: Payer: Self-pay | Admitting: Family Medicine

## 2015-04-10 DIAGNOSIS — M79641 Pain in right hand: Secondary | ICD-10-CM | POA: Insufficient documentation

## 2015-04-10 DIAGNOSIS — G5603 Carpal tunnel syndrome, bilateral upper limbs: Secondary | ICD-10-CM

## 2015-04-10 HISTORY — DX: Pain in right hand: M79.641

## 2015-04-17 DIAGNOSIS — G4733 Obstructive sleep apnea (adult) (pediatric): Secondary | ICD-10-CM | POA: Insufficient documentation

## 2015-04-17 DIAGNOSIS — R0602 Shortness of breath: Secondary | ICD-10-CM | POA: Insufficient documentation

## 2015-04-17 DIAGNOSIS — I471 Supraventricular tachycardia: Secondary | ICD-10-CM | POA: Insufficient documentation

## 2015-05-02 DIAGNOSIS — R600 Localized edema: Secondary | ICD-10-CM

## 2015-05-02 HISTORY — DX: Localized edema: R60.0

## 2015-05-15 ENCOUNTER — Encounter: Payer: Self-pay | Admitting: Family Medicine

## 2015-05-15 ENCOUNTER — Ambulatory Visit
Admission: RE | Admit: 2015-05-15 | Discharge: 2015-05-15 | Disposition: A | Discharge status: Home / Self Care | Payer: Medicaid Other | Source: Ambulatory Visit | Attending: Family Medicine | Admitting: Family Medicine

## 2015-05-15 ENCOUNTER — Ambulatory Visit (INDEPENDENT_AMBULATORY_CARE_PROVIDER_SITE_OTHER): Payer: Medicaid Other | Admitting: Family Medicine

## 2015-05-15 VITALS — BP 102/70 | HR 124 | Temp 98.5°F | Resp 16 | Ht 64.0 in | Wt 211.2 lb

## 2015-05-15 DIAGNOSIS — M5481 Occipital neuralgia: Secondary | ICD-10-CM

## 2015-05-15 DIAGNOSIS — M2550 Pain in unspecified joint: Secondary | ICD-10-CM | POA: Diagnosis not present

## 2015-05-15 DIAGNOSIS — G8929 Other chronic pain: Secondary | ICD-10-CM

## 2015-05-15 DIAGNOSIS — M79641 Pain in right hand: Secondary | ICD-10-CM

## 2015-05-15 DIAGNOSIS — M797 Fibromyalgia: Secondary | ICD-10-CM

## 2015-05-15 MED ORDER — PREDNISONE 10 MG (21) PO TBPK
ORAL_TABLET | ORAL | Status: DC
Start: 2015-05-15 — End: 2015-08-14

## 2015-05-15 NOTE — Patient Instructions (Signed)
Osteoarthritis °Osteoarthritis is a disease that causes soreness and inflammation of a joint. It occurs when the cartilage at the affected joint wears down. Cartilage acts as a cushion, covering the ends of bones where they meet to form a joint. Osteoarthritis is the most common form of arthritis. It often occurs in older people. The joints affected most often by this condition include those in the: °· Ends of the fingers. °· Thumbs. °· Neck. °· Lower back. °· Knees. °· Hips. °CAUSES  °Over time, the cartilage that covers the ends of bones begins to wear away. This causes bone to rub on bone, producing pain and stiffness in the affected joints.  °RISK FACTORS °Certain factors can increase your chances of having osteoarthritis, including: °· Older age. °· Excessive body weight. °· Overuse of joints. °· Previous joint injury. °SIGNS AND SYMPTOMS  °· Pain, swelling, and stiffness in the joint. °· Over time, the joint may lose its normal shape. °· Small deposits of bone (osteophytes) may grow on the edges of the joint. °· Bits of bone or cartilage can break off and float inside the joint space. This may cause more pain and damage. °DIAGNOSIS  °Your health care provider will do a physical exam and ask about your symptoms. Various tests may be ordered, such as: °· X-rays of the affected joint. °· An MRI scan. °· Blood tests to rule out other types of arthritis. °· Joint fluid tests. This involves using a needle to draw fluid from the joint and examining the fluid under a microscope. °TREATMENT  °Goals of treatment are to control pain and improve joint function. Treatment plans may include: °· A prescribed exercise program that allows for rest and joint relief. °· A weight control plan. °· Pain relief techniques, such as: °¨ Properly applied heat and cold. °¨ Electric pulses delivered to nerve endings under the skin (transcutaneous electrical nerve stimulation [TENS]). °¨ Massage. °¨ Certain nutritional  supplements. °· Medicines to control pain, such as: °¨ Acetaminophen. °¨ Nonsteroidal anti-inflammatory drugs (NSAIDs), such as naproxen. °¨ Narcotic or central-acting agents, such as tramadol. °¨ Corticosteroids. These can be given orally or as an injection. °· Surgery to reposition the bones and relieve pain (osteotomy) or to remove loose pieces of bone and cartilage. Joint replacement may be needed in advanced states of osteoarthritis. °HOME CARE INSTRUCTIONS  °· Take medicines only as directed by your health care provider. °· Maintain a healthy weight. Follow your health care provider's instructions for weight control. This may include dietary instructions. °· Exercise as directed. Your health care provider can recommend specific types of exercise. These may include: °¨ Strengthening exercises. These are done to strengthen the muscles that support joints affected by arthritis. They can be performed with weights or with exercise bands to add resistance. °¨ Aerobic activities. These are exercises, such as brisk walking or low-impact aerobics, that get your heart pumping. °¨ Range-of-motion activities. These keep your joints limber. °¨ Balance and agility exercises. These help you maintain daily living skills. °· Rest your affected joints as directed by your health care provider. °· Keep all follow-up visits as directed by your health care provider. °SEEK MEDICAL CARE IF:  °· Your skin turns red. °· You develop a rash in addition to your joint pain. °· You have worsening joint pain. °· You have a fever along with joint or muscle aches. °SEEK IMMEDIATE MEDICAL CARE IF: °· You have a significant loss of weight or appetite. °· You have night sweats. °FOR MORE   INFORMATION  °· National Institute of Arthritis and Musculoskeletal and Skin Diseases: www.niams.nih.gov °· National Institute on Aging: www.nia.nih.gov °· American College of Rheumatology: www.rheumatology.org °Document Released: 09/16/2005 Document Revised:  01/31/2014 Document Reviewed: 05/24/2013 °[employer]® Patient Information ©2015 [employer]. This information is not intended to replace advice given to you by your health care provider. Make sure you discuss any questions you have with your health care provider. ° °

## 2015-05-15 NOTE — Progress Notes (Signed)
Name: Dana Bradley   MRN: [id number]    DOB: 12-22-1970   Date:05/15/2015       Progress Note  Subjective  Chief Complaint  Chief Complaint  Patient presents with  . Edema    Patient presents with edema & pain in right digits, especially the 3rd and 4th. Patient states that she has a strong family history of RA.     HPI  [name] is a right hand dominant 44 year old female who is here today to discuss her concerns regarding several months of progressively worsening swelling, pain and weakness in her right hand fingers. The digits involved are mainly the 2nd, 3rd and 4th digits at the DIP and PIP joints but can involve the base of the thumb as well.  Elliot works with her hands a lot, both to maintain her home cooking and cleaning as well as hobbies such as Education officer, environmental. She has been unable to participate in her hobbies recently due to her hand limitations. She reports being unable to open jars, wring clothes, stir for long durations. Related issues in the past have been shoulder bursitis, bilaterally. Medications she has tried include gabapentin, lyrica, topical agents, muscle relaxants, Tramadol, Cymbalta, elavil, tylenol, opioid medications, tizanidine. She has had ineffective response to some of those medications or intolerable side effects especially NSAIDs. She would prefer to avoid narcotic medication. Tramadol has caused palpitations. Recently she has been consulting with Dr. Manuella Ghazi at [facility] regarding occipital headaches as well as numbness, tingling, pain in her hands that wake her up at night. She reports that nerve conduction studies have ruled out carpal tunnel syndrome. Regarding her headaches occipital nerve blocks have alleviated her symptoms in the past but the more recent injections are not helping. Dr. Manuella Ghazi has requested that she be referred to Dr. Primus Bravo for occipital nerve radiofrequency ablation due to her insurance restriction PCP must place the referral.    Patient Active Problem List   Diagnosis Date Noted  . Carpal tunnel syndrome, bilateral 04/10/2015  . DDD (degenerative disc disease), lumbosacral 01/24/2014  . Cervico-occipital neuralgia 12/29/2013  . 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 Menorrhagia 12/10/2012  . MICROSCOPIC HEMATURIA 06/15/2009  . PREMENSTRUAL TENSION SYNDROMES 01/30/2009  . DEPRESSION, CHRONIC 01/12/2009  . BREAST TENDERNESS 01/12/2009  . KNEE PAIN, BILATERAL 12/21/2008  . PANIC ATTACK 06/23/2008  . HYPERTENSION, BENIGN ESSENTIAL 06/23/2008  . SUPRAVENTRICULAR TACHYCARDIA 06/17/2008  . VERTIGO 06/17/2008  . SLEEP APNEA 06/17/2008  . GERD 06/13/2008  . HEADACHE 05/15/2007  . FIBROMYALGIA 09/30/2002    Social History  Substance Use Topics  . Smoking status: Former Smoker -- 4.00 packs/day for 3 years    Types: Cigarettes    Quit date: 12/10/1992  . Smokeless tobacco: Not on file  . Alcohol Use: No     Current outpatient prescriptions:  .  amitriptyline (ELAVIL) 25 MG tablet, Take 1 tablet (25 mg total) by mouth at bedtime., Disp: 30 tablet, Rfl: 3 .  HYDROcodone-acetaminophen (NORCO/VICODIN) 5-325 MG per tablet, Take 1 tablet by mouth every 6 (six) hours as needed for pain., Disp: 30 tablet, Rfl: 1 .  metoprolol succinate (TOPROL-XL) 50 MG 24 hr tablet, Take 1 tablet (50 mg total) by mouth daily. Take with or immediately following a meal., Disp: 30 tablet, Rfl: 3 .  omeprazole (PRILOSEC) 40 MG capsule, Take 1 capsule (40 mg total) by mouth daily., Disp: 30 capsule, Rfl: 3 .  tiZANidine (ZANAFLEX) 2 MG tablet, Take 1 tablet (2 mg total)  by mouth every 6 (six) hours as needed., Disp: 30 tablet, Rfl: 2  Past Surgical History  Procedure Laterality Date  . Ablation      Uterine  . Tubal ligation  10/01/99  . Cardiac catheterization    . Knee arthroscopy      Family History  Problem Relation Age of Onset  . Depression Mother   . Hypertension Mother   . Cancer Mother     Skin  . Hyperlipidemia Mother    . Alcohol abuse Father   . Depression Father   . Stroke Father   . Heart disease Father   . Hypertension Father   . Depression Sister   . Hyperlipidemia Sister   . Diabetes Sister   . Cancer Maternal Grandmother 57    Breast  . Polycystic ovary syndrome Sister   . Alzheimer's disease      Allergies  Allergen Reactions  . Depakote [Divalproex Sodium] Shortness Of Breath    W/ n/v  . Haloperidol Shortness Of Breath    W/ n/v  . Trazodone Shortness Of Breath  . Trazodone And Nefazodone Shortness Of Breath  . Aspirin Swelling  . Demerol [Meperidine] Nausea And Vomiting  . Divalproex Sodium   . Haloperidol Lactate   . Iodinated Diagnostic Agents Other (See Comments)  . Latex Itching  . Meloxicam Other (See Comments)    mout sores, tingling  . Meperidine Hcl   . Nsaids Other (See Comments)  . Penicillins   . Sulfonamide Derivatives   . Bacitracin-Neomycin-Polymyxin Rash  . Cephalosporins Rash    rash  . Ibuprofen Other (See Comments) and Rash    Blisters in mouth. Blisters in mouth.  Doreatha Massed [Cephalexin] Rash  . Neosporin [Neomycin-Bacitracin Zn-Polymyx] Rash  . Sulfa Antibiotics Rash    rash     Review of Systems  CONSTITUTIONAL: No significant weight changes, fever, chills, weakness or fatigue.  HEENT:  - Eyes: No visual changes.  - Ears: No auditory changes. No pain.  - Nose: No sneezing, congestion, runny nose. - Throat: No sore throat. No changes in swallowing. SKIN: No rash or itching.  CARDIOVASCULAR: No chest pain, chest pressure or chest discomfort. No palpitations or edema.  RESPIRATORY: No shortness of breath, cough or sputum.  GASTROINTESTINAL: No anorexia, nausea, vomiting. No changes in bowel habits. No abdominal pain or blood.  GENITOURINARY: No dysuria. No frequency. No discharge.  NEUROLOGICAL: Yes headache on and off. No dizziness, syncope, paralysis, ataxia. Yes numbness or tingling in the extremities. No memory changes. No change in bowel  or bladder control.  MUSCULOSKELETAL: Yes joint pain. Yes muscle pain. HEMATOLOGIC: No anemia, bleeding or bruising.  LYMPHATICS: No enlarged lymph nodes.  PSYCHIATRIC: No change in mood. No change in sleep pattern.  ENDOCRINOLOGIC: No reports of sweating, cold or heat intolerance. No polyuria or polydipsia.     Objective  BP 102/70 mmHg  Pulse 124  Temp(Src) 98.5 F (36.9 C) (Oral)  Resp 16  Ht 5\' 4"  (1.626 m)  Wt 211 lb 3.2 oz (95.8 kg)  BMI 36.23 kg/m2  SpO2 96%  LMP 05/06/2015 (Approximate) Body mass index is 36.23 kg/(m^2).  Physical Exam  Constitutional: Patient obese and well-nourished. In no distress.  HEENT:  - Head: Normocephalic and atraumatic.  - Ears: Bilateral TMs gray, no erythema or effusion - Nose: Nasal mucosa moist - Mouth/Throat: Oropharynx is clear and moist. No tonsillar hypertrophy or erythema. No post nasal drainage.  - Eyes: Conjunctivae clear, EOM movements normal. PERRLA. No  scleral icterus.  Neck: Normal range of motion. Neck supple. No JVD present. No thyromegaly present.  Cardiovascular: Normal rate, regular rhythm and normal heart sounds.  No murmur heard.  Pulmonary/Chest: Effort normal and breath sounds normal. No respiratory distress. Musculoskeletal: Normal range of motion bilateral UE and LE, no joint effusions.  Right and left hand with no erythema or obvious swelling. Tenderness most prominent at right hand 2nd and 3rd digits PIP joint with crepitus. Grip strength intact 5/5. Peripheral vascular: Bilateral LE no edema. Neurological: CN II-XII grossly intact with no focal deficits. Alert and oriented to person, place, and time. Coordination, balance, strength, speech and gait are normal.  Skin: Skin is warm and dry. No rash noted. No erythema.  Psychiatric: Patient has a jovial mood and affect. Behavior is normal in office today. Judgment and thought content normal in office today.   Assessment & Pl 1. Fibromyalgia affecting multiple  sites Clinically stable findings based on clinical exam and on review of any pertinent results. Recommended to patient that they continue their current regimen with regular follow ups.   2. Right hand pain Notable crepitus in affected joints, prednisone taper pack prescribed. Suspect OA but will rule out RA. May benefit from intraarticular steroid injection.  - DG Hand Complete Right; Future - Cyclic citrul peptide antibody, IgG - Rheumatoid factor - Sedimentation rate - predniSONE (STERAPRED UNI-PAK 21 TAB) 10 MG (21) TBPK tablet; Use as directed in a 6 day taper pack.  Dispense: 21 tablet; Refill: 0  3. Cervico-occipital neuralgia Reviewed Dr. Trena Platt notes regarding Earnestene's care for her cervico-occipital neuralgia and the recommended referral has been placed to Dr. Primus Bravo for occipital nerve radio frequency ablation.  - Ambulatory referral to Pain Clinic  4. Chronic pain of multiple joints Clinically stable findings based on clinical exam and on review of any pertinent results. Recommended to patient that they continue their current regimen with regular follow ups. We discussed trial of Celebrex near future.

## 2015-05-16 ENCOUNTER — Encounter: Payer: Self-pay | Admitting: *Deleted

## 2015-05-16 LAB — RHEUMATOID FACTOR: Rhuematoid fact SerPl-aCnc: 10.5 IU/mL (ref 0.0–13.9)

## 2015-05-16 LAB — SEDIMENTATION RATE: SED RATE: 5 mm/h (ref 0–32)

## 2015-05-17 ENCOUNTER — Ambulatory Visit: Payer: Medicaid Other | Admitting: Anesthesiology

## 2015-05-17 ENCOUNTER — Ambulatory Visit
Admission: RE | Admit: 2015-05-17 | Discharge: 2015-05-17 | Disposition: A | Discharge status: Home / Self Care | Payer: Medicaid Other | Source: Ambulatory Visit | Attending: Unknown Physician Specialty | Admitting: Unknown Physician Specialty

## 2015-05-17 ENCOUNTER — Encounter
Admission: RE | Disposition: A | Discharge status: Home / Self Care | Payer: Self-pay | Source: Ambulatory Visit | Attending: Unknown Physician Specialty

## 2015-05-17 ENCOUNTER — Encounter: Payer: Self-pay | Admitting: Anesthesiology

## 2015-05-17 DIAGNOSIS — Z880 Allergy status to penicillin: Secondary | ICD-10-CM | POA: Diagnosis not present

## 2015-05-17 DIAGNOSIS — Z885 Allergy status to narcotic agent status: Secondary | ICD-10-CM | POA: Diagnosis not present

## 2015-05-17 DIAGNOSIS — R197 Diarrhea, unspecified: Secondary | ICD-10-CM | POA: Insufficient documentation

## 2015-05-17 DIAGNOSIS — M1990 Unspecified osteoarthritis, unspecified site: Secondary | ICD-10-CM | POA: Insufficient documentation

## 2015-05-17 DIAGNOSIS — E669 Obesity, unspecified: Secondary | ICD-10-CM | POA: Diagnosis not present

## 2015-05-17 DIAGNOSIS — I471 Supraventricular tachycardia: Secondary | ICD-10-CM | POA: Diagnosis not present

## 2015-05-17 DIAGNOSIS — K640 First degree hemorrhoids: Secondary | ICD-10-CM | POA: Insufficient documentation

## 2015-05-17 DIAGNOSIS — Z808 Family history of malignant neoplasm of other organs or systems: Secondary | ICD-10-CM | POA: Diagnosis not present

## 2015-05-17 DIAGNOSIS — Z886 Allergy status to analgesic agent status: Secondary | ICD-10-CM | POA: Diagnosis not present

## 2015-05-17 DIAGNOSIS — Z87891 Personal history of nicotine dependence: Secondary | ICD-10-CM | POA: Insufficient documentation

## 2015-05-17 DIAGNOSIS — K625 Hemorrhage of anus and rectum: Secondary | ICD-10-CM | POA: Diagnosis not present

## 2015-05-17 DIAGNOSIS — Z803 Family history of malignant neoplasm of breast: Secondary | ICD-10-CM | POA: Insufficient documentation

## 2015-05-17 DIAGNOSIS — Z79899 Other long term (current) drug therapy: Secondary | ICD-10-CM | POA: Insufficient documentation

## 2015-05-17 DIAGNOSIS — M797 Fibromyalgia: Secondary | ICD-10-CM | POA: Insufficient documentation

## 2015-05-17 DIAGNOSIS — Z833 Family history of diabetes mellitus: Secondary | ICD-10-CM | POA: Insufficient documentation

## 2015-05-17 DIAGNOSIS — Z8489 Family history of other specified conditions: Secondary | ICD-10-CM | POA: Diagnosis not present

## 2015-05-17 DIAGNOSIS — K580 Irritable bowel syndrome with diarrhea: Secondary | ICD-10-CM | POA: Diagnosis not present

## 2015-05-17 DIAGNOSIS — K210 Gastro-esophageal reflux disease with esophagitis: Secondary | ICD-10-CM | POA: Insufficient documentation

## 2015-05-17 DIAGNOSIS — G4730 Sleep apnea, unspecified: Secondary | ICD-10-CM | POA: Insufficient documentation

## 2015-05-17 DIAGNOSIS — Z882 Allergy status to sulfonamides status: Secondary | ICD-10-CM | POA: Insufficient documentation

## 2015-05-17 DIAGNOSIS — R42 Dizziness and giddiness: Secondary | ICD-10-CM | POA: Insufficient documentation

## 2015-05-17 DIAGNOSIS — Z823 Family history of stroke: Secondary | ICD-10-CM | POA: Insufficient documentation

## 2015-05-17 DIAGNOSIS — Z811 Family history of alcohol abuse and dependence: Secondary | ICD-10-CM | POA: Insufficient documentation

## 2015-05-17 DIAGNOSIS — M719 Bursopathy, unspecified: Secondary | ICD-10-CM | POA: Diagnosis not present

## 2015-05-17 DIAGNOSIS — K2960 Other gastritis without bleeding: Secondary | ICD-10-CM | POA: Diagnosis present

## 2015-05-17 DIAGNOSIS — K621 Rectal polyp: Secondary | ICD-10-CM | POA: Diagnosis not present

## 2015-05-17 DIAGNOSIS — Z818 Family history of other mental and behavioral disorders: Secondary | ICD-10-CM | POA: Diagnosis not present

## 2015-05-17 DIAGNOSIS — Z8249 Family history of ischemic heart disease and other diseases of the circulatory system: Secondary | ICD-10-CM | POA: Insufficient documentation

## 2015-05-17 HISTORY — PX: ESOPHAGOGASTRODUODENOSCOPY: SHX5428

## 2015-05-17 HISTORY — PX: COLONOSCOPY WITH PROPOFOL: SHX5780

## 2015-05-17 LAB — CYCLIC CITRUL PEPTIDE ANTIBODY, IGG/IGA: Cyclic Citrullin Peptide Ab: 6 units (ref 0–19)

## 2015-05-17 SURGERY — COLONOSCOPY WITH PROPOFOL
Anesthesia: General

## 2015-05-17 MED ORDER — PROPOFOL INFUSION 10 MG/ML OPTIME
INTRAVENOUS | Status: DC | PRN
  Administered 2015-05-17: 140 ug/kg/min via INTRAVENOUS

## 2015-05-17 MED ORDER — PHENYLEPHRINE HCL 10 MG/ML IJ SOLN
INTRAMUSCULAR | Status: DC | PRN
  Administered 2015-05-17: 100 ug via INTRAVENOUS

## 2015-05-17 MED ORDER — SODIUM CHLORIDE 0.9 % IV SOLN
INTRAVENOUS | Status: DC
  Administered 2015-05-17: 14:00:00 via INTRAVENOUS

## 2015-05-17 MED ORDER — PROPOFOL 10 MG/ML IV BOLUS
INTRAVENOUS | Status: DC | PRN
  Administered 2015-05-17: 70 mg via INTRAVENOUS
  Administered 2015-05-17: 30 mg via INTRAVENOUS

## 2015-05-17 MED ORDER — ONDANSETRON HCL 4 MG/2ML IJ SOLN: 4.0000 mg | Freq: Once | INTRAMUSCULAR | Status: DC | PRN

## 2015-05-17 MED ORDER — METOPROLOL TARTRATE 50 MG PO TABS
ORAL_TABLET | ORAL | Status: DC
Start: 2015-05-17 — End: 2015-05-17
  Filled 2015-05-17: qty 1

## 2015-05-17 MED ORDER — MIDAZOLAM HCL 2 MG/2ML IJ SOLN
INTRAMUSCULAR | Status: DC | PRN
  Administered 2015-05-17: 2 mg via INTRAVENOUS

## 2015-05-17 MED ORDER — METOPROLOL TARTRATE 1 MG/ML IV SOLN
INTRAVENOUS | Status: DC | PRN
  Administered 2015-05-17: 3 mg via INTRAVENOUS

## 2015-05-17 MED ORDER — FENTANYL CITRATE (PF) 100 MCG/2ML IJ SOLN: 25.0000 ug | INTRAMUSCULAR | Status: DC | PRN

## 2015-05-17 MED ORDER — LIDOCAINE HCL (CARDIAC) 20 MG/ML IV SOLN
INTRAVENOUS | Status: DC | PRN
  Administered 2015-05-17: 60 mg via INTRAVENOUS

## 2015-05-17 MED ORDER — SODIUM CHLORIDE 0.9 % IV SOLN
INTRAVENOUS | Status: DC
  Administered 2015-05-17: 1000 mL via INTRAVENOUS

## 2015-05-17 NOTE — H&P (Signed)
Primary Care Physician:  Bobetta Lime, MD Primary Gastroenterologist:  Dr. Vira Agar  Pre-Procedure History & Physical: HPI:  Dana Bradley is a 44 y.o. female is here for an endoscopy and colonoscopy.   Past Medical History  Diagnosis Date  . Fibromyalgia   . Sleep apnea   . SVT (supraventricular tachycardia)   . Bursitis   . IBS (irritable bowel syndrome)   . Osteoarthritis   . Lumbar discitis   . Vertigo   . GERD (gastroesophageal reflux disease)     Past Surgical History  Procedure Laterality Date  . Ablation      Uterine  . Tubal ligation  10/01/99  . Cardiac catheterization    . Knee arthroscopy      Prior to Admission medications   Medication Sig Start Date End Date Taking? Authorizing Provider  ALPRAZolam Duanne Moron) 0.25 MG tablet Take by mouth. 11/07/14  Yes Historical Provider, MD  amitriptyline (ELAVIL) 25 MG tablet Take 1 tablet (25 mg total) by mouth at bedtime. 12/10/12  Yes Donnamae Jude, MD  HYDROcodone-acetaminophen (NORCO/VICODIN) 5-325 MG per tablet Take 1 tablet by mouth every 6 (six) hours as needed for pain. 12/10/12  Yes Donnamae Jude, MD  lamoTRIgine (LAMICTAL) 25 MG tablet TAKE 1 TABLET BY MOUTH TWICE DAILY FOR 2 WEEK,THEN TAKE 2 TABLETS BY MOUTH TWICE DAILY 03/08/15  Yes Historical Provider, MD  LYRICA 150 MG capsule TAKE 1 CAPSULE BY MOUTH IN THE MORNING AND TAKE 2 CAPSULES AT BEDTIME 03/01/15  Yes Historical Provider, MD  metoprolol succinate (TOPROL-XL) 50 MG 24 hr tablet Take 1 tablet (50 mg total) by mouth daily. Take with or immediately following a meal. 12/10/12  Yes Donnamae Jude, MD  omeprazole (PRILOSEC) 40 MG capsule Take 1 capsule (40 mg total) by mouth daily. 12/10/12  Yes Donnamae Jude, MD  SAVELLA 100 MG TABS tablet Take 100 mg by mouth 2 (two) times daily. 03/22/15  Yes Historical Provider, MD  SUMAtriptan (IMITREX) 100 MG tablet Take 0.5 pill at headache onset.  May take a second dose after 2 hours if needed. 02/02/15  Yes Historical Provider, MD   tiZANidine (ZANAFLEX) 2 MG tablet Take 1 tablet (2 mg total) by mouth every 6 (six) hours as needed. 12/10/12  Yes Donnamae Jude, MD  diclofenac sodium (VOLTAREN) 1 % GEL 4 g 4 (four) times a day. 12/15/13   Historical Provider, MD  predniSONE (STERAPRED UNI-PAK 21 TAB) 10 MG (21) TBPK tablet Use as directed in a 6 day taper pack. Patient not taking: Reported on 05/17/2015 05/15/15   Bobetta Lime, MD    Allergies as of 04/10/2015 - Review Complete 01/05/2013  Allergen Reaction Noted  . Depakote [divalproex sodium] Shortness Of Breath 12/10/2012  . Trazodone and nefazodone Shortness Of Breath 12/10/2012  . Aspirin  06/13/2008  . Demerol [meperidine] Nausea And Vomiting 12/10/2012  . Divalproex sodium  12/21/2008  . Haloperidol lactate  12/21/2008  . Ibuprofen Other (See Comments) 01/05/2013  . Meperidine hcl  06/13/2008  . Penicillins  06/13/2008  . Sulfonamide derivatives  06/13/2008  . Keflet [cephalexin] Rash 12/10/2012  . Neosporin [neomycin-bacitracin zn-polymyx] Rash 12/10/2012    Family History  Problem Relation Age of Onset  . Depression Mother   . Hypertension Mother   . Cancer Mother     Skin  . Hyperlipidemia Mother   . Alcohol abuse Father   . Depression Father   . Stroke Father   . Heart disease Father   .  Hypertension Father   . Depression Sister   . Hyperlipidemia Sister   . Diabetes Sister   . Cancer Maternal Grandmother 61    Breast  . Polycystic ovary syndrome Sister   . Alzheimer's disease      Social History   Social History  . Marital Status: Single    Spouse Name: N/A  . Number of Children: N/A  . Years of Education: N/A   Occupational History  . Not on file.   Social History Main Topics  . Smoking status: Former Smoker -- 4.00 packs/day for 3 years    Types: Cigarettes    Quit date: 12/10/1992  . Smokeless tobacco: Not on file  . Alcohol Use: No  . Drug Use: Not on file  . Sexual Activity:    Partners: Male   Other Topics Concern   . Not on file   Social History Narrative    Review of Systems: See HPI, otherwise negative ROS  Physical Exam: BP 146/88 mmHg  Pulse 125  Temp(Src) 98 F (36.7 C)  Resp 16  SpO2 100%  LMP 04/30/2015 (Exact Date) General:   Alert,  pleasant and cooperative in NAD Head:  Normocephalic and atraumatic. Neck:  Supple; no masses or thyromegaly. Lungs:  Clear throughout to auscultation.    Heart:  Regular rate and rhythm. Abdomen:  Soft, nontender and nondistended. Normal bowel sounds, without guarding, and without rebound.   Neurologic:  Alert and  oriented x4;  grossly normal neurologically.  Impression/Plan: Dana Bradley is here for an endoscopy and colonoscopy to be performed for Rectal bleeding, abdominal pain left mid abdomen, chronic nausea,   Risks, benefits, limitations, and alternatives regarding  endoscopy and colonoscopy have been reviewed with the patient.  Questions have been answered.  All parties agreeable.   Gaylyn Cheers, MD  05/17/2015, 2:23 PM

## 2015-05-17 NOTE — Transfer of Care (Signed)
Immediate Anesthesia Transfer of Care Note  Patient: Dana Bradley  Procedure(s) Performed: Procedure(s): COLONOSCOPY WITH PROPOFOL (N/A) ESOPHAGOGASTRODUODENOSCOPY (EGD) (N/A)  Patient Location: Endoscopy Unit  Anesthesia Type:General  Level of Consciousness: awake, alert , oriented and patient cooperative  Airway & Oxygen Therapy: Patient Spontanous Breathing and Patient connected to nasal cannula oxygen  Post-op Assessment: Report given to RN, Post -op Vital signs reviewed and stable and Patient moving all extremities X 4  Post vital signs: Reviewed and stable  Last Vitals:  Filed Vitals:   05/17/15 1525  BP: 105/61  Pulse: 95  Temp: 36.1 C  Resp: 21    Complications: No apparent anesthesia complications

## 2015-05-17 NOTE — Anesthesia Preprocedure Evaluation (Addendum)
Anesthesia Evaluation  Patient identified by MRN, date of birth, ID band Patient awake    Reviewed: Allergy & Precautions, NPO status , Patient's Chart, lab work & pertinent test results, reviewed documented beta blocker date and time   Airway Mallampati: III  TM Distance: <3 FB Neck ROM: Full    Dental no notable dental hx.    Pulmonary sleep apnea and Continuous Positive Airway Pressure Ventilation , former smoker,  breath sounds clear to auscultation  Pulmonary exam normal       Cardiovascular hypertension, Pt. on medications Normal cardiovascular exam    Neuro/Psych Depression fibromyalgia    GI/Hepatic Neg liver ROS, GERD-  Controlled and Medicated,  Endo/Other  negative endocrine ROS  Renal/GU negative Renal ROS  negative genitourinary   Musculoskeletal  (+) Arthritis -, Osteoarthritis,  Fibromyalgia -, narcotic dependent  Abdominal (+) + obese,   Peds negative pediatric ROS (+)  Hematology negative hematology ROS (+)   Anesthesia Other Findings   Reproductive/Obstetrics                          Anesthesia Physical Anesthesia Plan  ASA: II  Anesthesia Plan: General   Post-op Pain Management:    Induction: Intravenous  Airway Management Planned: Nasal Cannula  Additional Equipment:   Intra-op Plan:   Post-operative Plan:   Informed Consent: I have reviewed the patients History and Physical, chart, labs and discussed the procedure including the risks, benefits and alternatives for the proposed anesthesia with the patient or authorized representative who has indicated his/her understanding and acceptance.   Dental advisory given  Plan Discussed with: CRNA and Surgeon  Anesthesia Plan Comments:         Anesthesia Quick Evaluation

## 2015-05-17 NOTE — Op Note (Signed)
[facility] Gastroenterology Patient Name: Dana Bradley Procedure Date: 05/17/2015 2:34 PM MRN: [id number] Account #: [id number] Date of Birth: [birth year] Admit Type: Outpatient Age: 44 Room: [facility] ENDO ROOM 3 Gender: Female Note Status: Finalized Procedure:         Upper GI endoscopy Indications:       Abdominal pain in the left upper quadrant Providers:         Manya Silvas, MD Referring MD:      No Local Md, MD (Referring MD) Medicines:         Propofol per Anesthesia Complications:     No immediate complications. Procedure:         Pre-Anesthesia Assessment:                    - After reviewing the risks and benefits, the patient was                     deemed in satisfactory condition to undergo the procedure.                    After obtaining informed consent, the endoscope was passed                     under direct vision. Throughout the procedure, the                     patient's blood pressure, pulse, and oxygen saturations                     were monitored continuously. The Endoscope was introduced                     through the mouth, and advanced to the second part of                     duodenum. The upper GI endoscopy was accomplished without                     difficulty. The patient tolerated the procedure well. Findings:      LA Grade A (one or more mucosal breaks less than 5 mm, not extending       between tops of 2 mucosal folds) esophagitis with no bleeding was found       39 cm from the incisors. Biopsies were taken with a cold forceps for       histology.      Localized mildly erythematous mucosa without bleeding was found on the       greater curvature of the stomach. Biopsies were taken with a cold       forceps for histology. Biopsies were taken with a cold forceps for       Helicobacter pylori testing.      The gastric body and gastric antrum were normal. Biopsies were taken       with a cold forceps for histology. Biopsies were  taken with a cold       forceps for Helicobacter pylori testing.      The duodenal bulb was normal.      The 2nd part of the duodenum was normal. Biopsies were taken with a cold       forceps for histology. Done to look for celiac disease. Impression:        - LA Grade A reflux esophagitis. Biopsied.                    -  Erythematous mucosa in the greater curvature. Biopsied.                    - Normal gastric body and antrum. Biopsied.                    - Normal duodenal bulb.                    - Normal 2nd part of the duodenum. Biopsied. Recommendation:    - Await pathology results. Manya Silvas, MD 05/17/2015 2:55:39 PM This report has been signed electronically. Number of Addenda: 0 Note Initiated On: 05/17/2015 2:34 PM      [facility]

## 2015-05-17 NOTE — Op Note (Signed)
[facility] Gastroenterology Patient Name: Dana Bradley Procedure Date: 05/17/2015 2:33 PM MRN: [id number] Account #: [id number] Date of Birth: 1971-02-19 Admit Type: Outpatient Age: 44 Room: [facility] ENDO ROOM 3 Gender: Female Note Status: Finalized Procedure:         Colonoscopy Indications:       Rectal bleeding Providers:         Manya Silvas, MD Referring MD:      No Local Md, MD (Referring MD) Medicines:         Propofol per Anesthesia Complications:     No immediate complications. Procedure:         Pre-Anesthesia Assessment:                    - After reviewing the risks and benefits, the patient was                     deemed in satisfactory condition to undergo the procedure.                    After obtaining informed consent, the colonoscope was                     passed under direct vision. Throughout the procedure, the                     patient's blood pressure, pulse, and oxygen saturations                     were monitored continuously. The Colonoscope was                     introduced through the anus and advanced to the the cecum,                     identified by appendiceal orifice and ileocecal valve. The                     colonoscopy was performed without difficulty. The patient                     tolerated the procedure well. The quality of the bowel                     preparation was excellent. Findings:      A diminutive polyp was found in the distal rectum. The polyp was       sessile. The polyp was removed with a cold biopsy forceps. Resection and       retrieval were complete.      Internal hemorrhoids were found during endoscopy. The hemorrhoids were       medium-sized and Grade I (internal hemorrhoids that do not prolapse).      The sigmoid colon appeared normal. Biopsies were taken with a cold       forceps for histology. Done due to diarrhea.      The exam was otherwise without abnormality. Impression:        - One  diminutive polyp in the rectum. Resected and                     retrieved.                    - Internal hemorrhoids.                    -  The sigmoid colon is normal. Biopsied.                    - The examination was otherwise normal. Recommendation:    - Await pathology results. Bleeding came from internal                     hemorhoids and patient likely has irritable bowel. Procedure Code(s): --- Professional ---                    [phone], Colonoscopy, flexible; with biopsy, single or                     multiple CPT copyright 2014 American Medical Association. All rights reserved. The codes documented in this report are preliminary and upon coder review may  be revised to meet current compliance requirements. Manya Silvas, MD 05/17/2015 3:23:06 PM This report has been signed electronically. Number of Addenda: 0 Note Initiated On: 05/17/2015 2:33 PM Scope Withdrawal Time: 0 hours 14 minutes 0 seconds  Total Procedure Duration: 0 hours 21 minutes 11 seconds       [facility]

## 2015-05-17 NOTE — Anesthesia Postprocedure Evaluation (Signed)
  Anesthesia Post-op Note  Patient: Dana Bradley  Procedure(s) Performed: Procedure(s): COLONOSCOPY WITH PROPOFOL (N/A) ESOPHAGOGASTRODUODENOSCOPY (EGD) (N/A)  Anesthesia type:General  Patient location: PACU  Post pain: Pain level controlled  Post assessment: Post-op Vital signs reviewed, Patient's Cardiovascular Status Stable, Respiratory Function Stable, Patent Airway and No signs of Nausea or vomiting  Post vital signs: Reviewed and stable  Last Vitals:  Filed Vitals:   05/17/15 1550  BP: 114/70  Pulse: 86  Temp:   Resp: 16    Level of consciousness: awake, alert  and patient cooperative  Complications: No apparent anesthesia complications

## 2015-05-19 ENCOUNTER — Encounter: Payer: Self-pay | Admitting: Unknown Physician Specialty

## 2015-05-19 LAB — SURGICAL PATHOLOGY

## 2015-06-01 ENCOUNTER — Other Ambulatory Visit: Payer: Self-pay | Admitting: Nurse Practitioner

## 2015-06-01 DIAGNOSIS — K2930 Chronic superficial gastritis without bleeding: Secondary | ICD-10-CM

## 2015-06-02 DIAGNOSIS — K2930 Chronic superficial gastritis without bleeding: Secondary | ICD-10-CM | POA: Insufficient documentation

## 2015-06-06 ENCOUNTER — Ambulatory Visit: Payer: Self-pay

## 2015-06-14 ENCOUNTER — Ambulatory Visit: Payer: Medicaid Other | Admitting: Anesthesiology

## 2015-06-21 ENCOUNTER — Telehealth: Payer: Self-pay | Admitting: Family Medicine

## 2015-06-21 NOTE — Telephone Encounter (Signed)
Pt said that 3 family members have lost insurnace and she needs a call from the dr or nurse to discuss some issue. Just wants to explain what is going on and why

## 2015-06-21 NOTE — Telephone Encounter (Signed)
[name] and Dana Bradley has all lost insurance. Patient wants to know if she will be interested in taking the orange card that [facility] offers and that some of the [facility] facility accepts. The Medical Assistance Program will probably be contacting her in regards to their medications.  I informed her that I would relay this message to Dr. Nadine Counts and that she would get with the office manager to see what we could do.  Network Specialist for the [employer] card (for people who does not have insurance and are put on a sliding scale with a reasonable co-pay) [place]ta [place] [phone].

## 2015-06-21 NOTE — Telephone Encounter (Signed)
I spoke with Dana Bradley and she will relay information to [place] regarding [employer] accepted facilities which we unfortunately do not accept at our offices.

## 2015-06-26 NOTE — Telephone Encounter (Signed)
Informed patient that [facility] orange card is for coverage in [facility]. I also informed patient that I have reached out to the [employer] billing group just verify the information.

## 2015-07-06 ENCOUNTER — Other Ambulatory Visit: Payer: Self-pay | Admitting: Family Medicine

## 2015-07-12 ENCOUNTER — Ambulatory Visit (INDEPENDENT_AMBULATORY_CARE_PROVIDER_SITE_OTHER): Payer: Medicaid Other | Admitting: Family Medicine

## 2015-07-12 ENCOUNTER — Encounter: Payer: Self-pay | Admitting: Family Medicine

## 2015-07-12 VITALS — BP 118/82 | HR 109 | Temp 98.6°F | Resp 18 | Wt 210.5 lb

## 2015-07-12 DIAGNOSIS — G4700 Insomnia, unspecified: Secondary | ICD-10-CM | POA: Insufficient documentation

## 2015-07-12 DIAGNOSIS — M797 Fibromyalgia: Secondary | ICD-10-CM | POA: Diagnosis not present

## 2015-07-12 DIAGNOSIS — R110 Nausea: Secondary | ICD-10-CM | POA: Diagnosis not present

## 2015-07-12 DIAGNOSIS — G43001 Migraine without aura, not intractable, with status migrainosus: Secondary | ICD-10-CM | POA: Diagnosis not present

## 2015-07-12 DIAGNOSIS — F3340 Major depressive disorder, recurrent, in remission, unspecified: Secondary | ICD-10-CM

## 2015-07-12 DIAGNOSIS — I10 Essential (primary) hypertension: Secondary | ICD-10-CM | POA: Diagnosis not present

## 2015-07-12 MED ORDER — LAMOTRIGINE 25 MG PO TABS: 25.0000 mg | ORAL_TABLET | Freq: Two times a day (BID) | ORAL | Status: DC

## 2015-07-12 MED ORDER — TOPIRAMATE 50 MG PO TABS: 50.0000 mg | ORAL_TABLET | Freq: Two times a day (BID) | ORAL | Status: DC

## 2015-07-12 MED ORDER — AMITRIPTYLINE HCL 50 MG PO TABS: 100.0000 mg | ORAL_TABLET | Freq: Every day | ORAL | Status: DC

## 2015-07-12 MED ORDER — SAVELLA 100 MG PO TABS: 100.0000 mg | ORAL_TABLET | Freq: Two times a day (BID) | ORAL | Status: DC

## 2015-07-12 MED ORDER — ALPRAZOLAM 1 MG PO TABS: 1.0000 mg | ORAL_TABLET | Freq: Every evening | ORAL | Status: DC | PRN

## 2015-07-12 MED ORDER — METOPROLOL TARTRATE 50 MG PO TABS: 50.0000 mg | ORAL_TABLET | Freq: Two times a day (BID) | ORAL | Status: DC

## 2015-07-12 MED ORDER — PREGABALIN 200 MG PO CAPS: 200.0000 mg | ORAL_CAPSULE | Freq: Three times a day (TID) | ORAL | Status: DC

## 2015-07-12 MED ORDER — ONDANSETRON 4 MG PO TBDP: 4.0000 mg | ORAL_TABLET | Freq: Three times a day (TID) | ORAL | Status: DC | PRN

## 2015-07-12 NOTE — Progress Notes (Signed)
Name: Dana Bradley   MRN: [id number]    DOB: June 21, 1971   Date:07/12/2015       Progress Note  Subjective  Chief Complaint  Chief Complaint  Patient presents with  . Medication Refill  . Back Pain    muscles spasms  . Numbness    patient states she has been having this in her hands and feet. she also states "it feels like something is crawling on her" and that her feet falls asleep.     HPI  Dana Bradley is a right hand dominant 44 year old female who is here today to discuss her concerns regarding several months of progressive waxing and waning numbness tingling bugs crawling sensation in both hands and legs.  She is under a lot of stress, medicaid insurance will be revoked by end of November due to making more income than cut off however all of her family members are on several medications. The digits involved are mainly the 2nd, 3rd and 4th digits at the DIP and PIP joints but can involve the base of the thumb as well. Aevah works with her hands a lot, both to maintain her home cooking and cleaning as well as hobbies such as Education officer, environmental. She has been unable to participate in her hobbies recently due to her hand limitations. She reports being unable to open jars, wring clothes, stir for long durations. Related issues in the past have been shoulder bursitis, bilaterally. Medications she has tried include gabapentin, lyrica, topical agents, muscle relaxants, Tramadol, Cymbalta, elavil, tylenol, opioid medications, tizanidine. She has had ineffective response to some of those medications or intolerable side effects especially NSAIDs. She would prefer to avoid narcotic medication. Tramadol has caused palpitations. Recently she has been consulting with Dr. Manuella Ghazi at [facility] regarding occipital headaches as well as numbness, tingling, pain in her hands that wake her up at night. She reports that nerve conduction studies have ruled out carpal tunnel syndrome. Regarding her headaches  occipital nerve blocks have alleviated her symptoms in the past but the more recent injections are not helping. Dr. Manuella Ghazi has requested that she be referred to Dr. Primus Bravo for occipital nerve radiofrequency ablation due to her insurance restriction PCP must place the referral however this will have to be deferred due to upcoming loss of insurance. She is requesting several refills today and adjustment in doses.   Past Medical History  Diagnosis Date  . Fibromyalgia   . Sleep apnea   . SVT (supraventricular tachycardia) ([place])   . Bursitis   . IBS (irritable bowel syndrome)   . Osteoarthritis   . Lumbar discitis   . Vertigo   . GERD (gastroesophageal reflux disease)     Patient Active Problem List   Diagnosis Date Noted  . Chronic superficial gastritis 06/02/2015  . Abdominal pain, right upper quadrant 06/02/2015  . Chronic pain of multiple joints 05/15/2015  . Edema leg 05/02/2015  . Paroxysmal supraventricular tachycardia ([place]) 04/17/2015  . Right hand pain 04/10/2015  . DDD (degenerative disc disease), lumbosacral 01/24/2014  . Degeneration of intervertebral disc of lumbosacral region 01/24/2014  . Cervico-occipital neuralgia 12/29/2013  . Fibromyalgia 12/29/2013  . Menorrhagia 12/10/2012  . Depression, major, recurrent, in remission ([place]) 01/12/2009  . Knee pain, bilateral 12/21/2008  . Hypertension, benign essential, goal below 140/90 06/23/2008  . History of PSVT (paroxysmal supraventricular tachycardia) 06/17/2008  . Obstructive sleep apnea, adult 06/17/2008  . GERD 06/13/2008  . Fibromyalgia affecting multiple sites 09/30/2002  Social History  Substance Use Topics  . Smoking status: Former Smoker -- 4.00 packs/day for 3 years    Types: Cigarettes    Quit date: 12/10/1992  . Smokeless tobacco: Not on file  . Alcohol Use: No     Current outpatient prescriptions:  .  ranitidine (ZANTAC) 150 MG capsule, Take by mouth., Disp: , Rfl:  .  ALPRAZolam (XANAX) 0.25 MG  tablet, Take by mouth., Disp: , Rfl:  .  amitriptyline (ELAVIL) 50 MG tablet, Take 50 mg by mouth at bedtime., Disp: , Rfl: 5 .  diclofenac sodium (VOLTAREN) 1 % GEL, 4 g 4 (four) times a day., Disp: , Rfl:  .  HYDROcodone-acetaminophen (NORCO/VICODIN) 5-325 MG per tablet, Take 1 tablet by mouth every 6 (six) hours as needed for pain., Disp: 30 tablet, Rfl: 1 .  lamoTRIgine (LAMICTAL) 25 MG tablet, TAKE 1 TABLET BY MOUTH TWICE DAILY FOR 2 WEEK,THEN TAKE 2 TABLETS BY MOUTH TWICE DAILY, Disp: , Rfl: 8 .  LYRICA 150 MG capsule, TAKE 1 CAPSULE BY MOUTH IN THE MORNING AND TAKE 2 CAPSULES AT BEDTIME, Disp: , Rfl: 5 .  metoprolol succinate (TOPROL-XL) 50 MG 24 hr tablet, Take 1 tablet (50 mg total) by mouth daily. Take with or immediately following a meal., Disp: 30 tablet, Rfl: 3 .  omeprazole (PRILOSEC) 40 MG capsule, Take 1 capsule (40 mg total) by mouth daily., Disp: 30 capsule, Rfl: 3 .  predniSONE (STERAPRED UNI-PAK 21 TAB) 10 MG (21) TBPK tablet, Use as directed in a 6 day taper pack. (Patient not taking: Reported on 05/17/2015), Disp: 21 tablet, Rfl: 0 .  SAVELLA 100 MG TABS tablet, Take 100 mg by mouth 2 (two) times daily., Disp: , Rfl: 5 .  SUMAtriptan (IMITREX) 100 MG tablet, Take 0.5 pill at headache onset.  May take a second dose after 2 hours if needed., Disp: , Rfl:  .  tiZANidine (ZANAFLEX) 2 MG tablet, Take 1 tablet (2 mg total) by mouth every 6 (six) hours as needed., Disp: 30 tablet, Rfl: 2 .  topiramate (TOPAMAX) 25 MG tablet, Take by mouth., Disp: , Rfl:   Past Surgical History  Procedure Laterality Date  . Ablation      Uterine  . Tubal ligation  10/01/99  . Cardiac catheterization    . Knee arthroscopy    . Colonoscopy with propofol N/A 05/17/2015    Procedure: COLONOSCOPY WITH PROPOFOL;  Surgeon: Manya Silvas, MD;  Location: [facility] ENDOSCOPY;  Service: Endoscopy;  Laterality: N/A;  . Esophagogastroduodenoscopy N/A 05/17/2015    Procedure: ESOPHAGOGASTRODUODENOSCOPY (EGD);   Surgeon: Manya Silvas, MD;  Location[facility] ENDOSCOPY;  Service: Endoscopy;  Laterality: N/A;    Family History  Problem Relation Age of Onset  . Depression Mother   . Hypertension Mother   . Cancer Mother     Skin  . Hyperlipidemia Mother   . Alcohol abuse Father   . Depression Father   . Stroke Father   . Heart disease Father   . Hypertension Father   . Depression Sister   . Hyperlipidemia Sister   . Diabetes Sister   . Cancer Maternal Grandmother 20    Breast  . Polycystic ovary syndrome Sister   . Alzheimer's disease      Allergies  Allergen Reactions  . Demerol [Meperidine] Nausea And Vomiting    Patient projectile vomits and usually result in ER  . Depakote [Divalproex Sodium] Shortness Of Breath    W/ n/v  . Haloperidol Shortness  Of Breath    W/ n/v  . Trazodone Shortness Of Breath  . Trazodone And Nefazodone Shortness Of Breath  . Aspirin Swelling  . Divalproex Sodium   . Haloperidol Lactate   . Iodinated Diagnostic Agents Other (See Comments)  . Latex Itching  . Meloxicam Other (See Comments)    mout sores, tingling  . Meperidine Hcl   . Nsaids Other (See Comments)  . Penicillins   . Sulfonamide Derivatives   . Bacitracin-Neomycin-Polymyxin Rash  . Cephalosporins Rash    rash  . Ibuprofen Other (See Comments) and Rash    Blisters in mouth. Blisters in mouth.  Doreatha Massed [Cephalexin] Rash  . Neosporin [Neomycin-Bacitracin Zn-Polymyx] Rash  . Sulfa Antibiotics Rash    rash     Review of Systems  CONSTITUTIONAL: No significant weight changes, fever, chills, weakness or fatigue.  HEENT:  - Eyes: No visual changes.  - Ears: No auditory changes. No pain.  - Nose: No sneezing, congestion, runny nose. - Throat: No sore throat. No changes in swallowing. SKIN: No rash or itching.  CARDIOVASCULAR: No chest pain, chest pressure or chest discomfort. No palpitations or edema.  RESPIRATORY: No shortness of breath, cough or sputum.  NEUROLOGICAL: No  headache, dizziness, syncope, paralysis, ataxia. Yes numbness or tingling in the extremities. No memory changes. No change in bowel or bladder control.  MUSCULOSKELETAL: No joint pain. No muscle pain. HEMATOLOGIC: No anemia, bleeding or bruising.  LYMPHATICS: No enlarged lymph nodes.  PSYCHIATRIC: No change in mood. No change in sleep pattern.  ENDOCRINOLOGIC: No reports of sweating, cold or heat intolerance. No polyuria or polydipsia.     Objective  BP 118/82 mmHg  Pulse 109  Temp(Src) 98.6 F (37 C) (Oral)  Resp 18  Wt 210 lb 8 oz (95.482 kg)  SpO2 96% Body mass index is 36.11 kg/(m^2).  Physical Exam  Constitutional: Patient obese and well-nourished. In no distress.  HEENT:  - Head: Normocephalic and atraumatic.  - Ears: Bilateral TMs gray, no erythema or effusion - Nose: Nasal mucosa moist - Mouth/Throat: Oropharynx is clear and moist. No tonsillar hypertrophy or erythema. No post nasal drainage.  - Eyes: Conjunctivae clear, EOM movements normal. PERRLA. No scleral icterus.  Neck: Normal range of motion. Neck supple. No JVD present. No thyromegaly present.  Cardiovascular: Normal rate, regular rhythm and normal heart sounds. No murmur heard.  Pulmonary/Chest: Effort normal and breath sounds normal. No respiratory distress. Musculoskeletal: Normal range of motion bilateral UE and LE, no joint effusions. Right and left hand with no erythema or obvious swelling. Tenderness most prominent at right hand 2nd and 3rd digits PIP joint with crepitus. Grip strength intact 5/5. Peripheral vascular: Bilateral LE no edema. Neurological: CN II-XII grossly intact with no focal deficits. Alert and oriented to person, place, and time. Coordination, balance, strength, speech and gait are normal.  Skin: Skin is warm and dry. No rash noted. No erythema.  Psychiatric: Patient has a jovial mood and affect. Behavior is normal in office today. Judgment and thought content normal in office  today.     Assessment & Plan  1. Hypertension, benign essential, goal below 140/90 Well controled, refill.  - metoprolol (LOPRESSOR) 50 MG tablet; Take 1 tablet (50 mg total) by mouth 2 (two) times daily.  Dispense: 180 tablet; Refill: 3  2. Fibromyalgia affecting multiple sites Likely her symptoms are partly due to fibromyalgia, possibly undiagnosed CRPS.   - amitriptyline (ELAVIL) 50 MG tablet; Take 2 tablets (100 mg total) by  mouth at bedtime.  Dispense: 180 tablet; Refill: 3 - lamoTRIgine (LAMICTAL) 25 MG tablet; Take 1 tablet (25 mg total) by mouth 2 (two) times daily.  Dispense: 180 tablet; Refill: 3 - pregabalin (LYRICA) 200 MG capsule; Take 1 capsule (200 mg total) by mouth 3 (three) times daily.  Dispense: 90 capsule; Refill: 5 - SAVELLA 100 MG TABS tablet; Take 1 tablet (100 mg total) by mouth 2 (two) times daily.  Dispense: 180 tablet; Refill: 3  3. Depression, major, recurrent, in remission ([place]) Stable.   - ALPRAZolam (XANAX) 1 MG tablet; Take 1 tablet (1 mg total) by mouth at bedtime as needed for anxiety.  Dispense: 90 tablet; Refill: 1 - amitriptyline (ELAVIL) 50 MG tablet; Take 2 tablets (100 mg total) by mouth at bedtime.  Dispense: 180 tablet; Refill: 3 - lamoTRIgine (LAMICTAL) 25 MG tablet; Take 1 tablet (25 mg total) by mouth 2 (two) times daily.  Dispense: 180 tablet; Refill: 3 - SAVELLA 100 MG TABS tablet; Take 1 tablet (100 mg total) by mouth 2 (two) times daily.  Dispense: 180 tablet; Refill: 3  4. Insomnia Increased Xanax dose to 1 mg to help with sleep hopefully this will reduce day time sleepiness.   - ALPRAZolam (XANAX) 1 MG tablet; Take 1 tablet (1 mg total) by mouth at bedtime as needed for anxiety.  Dispense: 90 tablet; Refill: 1 - amitriptyline (ELAVIL) 50 MG tablet; Take 2 tablets (100 mg total) by mouth at bedtime.  Dispense: 180 tablet; Refill: 3  5. Migraine without aura and with status migrainosus, not intractable Increased topiramate  dose.  - topiramate (TOPAMAX) 50 MG tablet; Take 1 tablet (50 mg total) by mouth 2 (two) times daily.  Dispense: 180 tablet; Refill: 3  6. Nausea in adult patient Occasional nausea if having bad headache.  - ondansetron (ZOFRAN-ODT) 4 MG disintegrating tablet; Take 1 tablet (4 mg total) by mouth every 8 (eight) hours as needed for nausea or vomiting.  Dispense: 90 tablet; Refill: 1

## 2015-07-25 ENCOUNTER — Ambulatory Visit (INDEPENDENT_AMBULATORY_CARE_PROVIDER_SITE_OTHER): Payer: Medicaid Other

## 2015-07-25 DIAGNOSIS — Z23 Encounter for immunization: Secondary | ICD-10-CM

## 2015-08-06 ENCOUNTER — Other Ambulatory Visit: Payer: Self-pay | Admitting: Family Medicine

## 2015-08-14 ENCOUNTER — Telehealth: Payer: Self-pay | Admitting: Family Medicine

## 2015-08-14 ENCOUNTER — Other Ambulatory Visit: Payer: Self-pay | Admitting: Family Medicine

## 2015-08-14 DIAGNOSIS — F3340 Major depressive disorder, recurrent, in remission, unspecified: Secondary | ICD-10-CM

## 2015-08-14 DIAGNOSIS — M797 Fibromyalgia: Secondary | ICD-10-CM

## 2015-08-14 MED ORDER — DICLOFENAC SODIUM 1 % TD GEL: 4.0000 g | Freq: Four times a day (QID) | TRANSDERMAL | Status: DC

## 2015-08-14 MED ORDER — LAMOTRIGINE 25 MG PO TABS: 50.0000 mg | ORAL_TABLET | Freq: Two times a day (BID) | ORAL | Status: DC

## 2015-08-14 MED ORDER — ACETAMINOPHEN-CODEINE 300-60 MG PO TABS: 1.0000 | ORAL_TABLET | Freq: Four times a day (QID) | ORAL | Status: DC | PRN

## 2015-08-21 NOTE — Telephone Encounter (Signed)
ERRENOUS °

## 2015-08-28 ENCOUNTER — Ambulatory Visit: Payer: Medicaid Other | Admitting: Family Medicine

## 2015-08-30 ENCOUNTER — Encounter: Payer: Self-pay | Admitting: Family Medicine

## 2015-08-30 ENCOUNTER — Ambulatory Visit (INDEPENDENT_AMBULATORY_CARE_PROVIDER_SITE_OTHER): Payer: Medicaid Other | Admitting: Family Medicine

## 2015-08-30 VITALS — BP 110/74 | HR 115 | Temp 98.8°F | Resp 16 | Wt 208.2 lb

## 2015-08-30 DIAGNOSIS — R8299 Other abnormal findings in urine: Secondary | ICD-10-CM | POA: Diagnosis not present

## 2015-08-30 DIAGNOSIS — M6249 Contracture of muscle, multiple sites: Secondary | ICD-10-CM

## 2015-08-30 DIAGNOSIS — M62838 Other muscle spasm: Secondary | ICD-10-CM

## 2015-08-30 DIAGNOSIS — J0100 Acute maxillary sinusitis, unspecified: Secondary | ICD-10-CM | POA: Insufficient documentation

## 2015-08-30 DIAGNOSIS — R8290 Unspecified abnormal findings in urine: Secondary | ICD-10-CM

## 2015-08-30 LAB — POCT URINALYSIS DIPSTICK
Bilirubin, UA: NEGATIVE
Blood, UA: NEGATIVE
Glucose, UA: NEGATIVE
Ketones, UA: NEGATIVE
Leukocytes, UA: NEGATIVE
NITRITE UA: NEGATIVE
PH UA: 7
PROTEIN UA: NEGATIVE
Spec Grav, UA: 1.01
UROBILINOGEN UA: 0.2

## 2015-08-30 MED ORDER — AZITHROMYCIN 500 MG PO TABS: 500.0000 mg | ORAL_TABLET | Freq: Every day | ORAL | Status: DC

## 2015-08-30 MED ORDER — TIZANIDINE HCL 2 MG PO TABS: 2.0000 mg | ORAL_TABLET | Freq: Four times a day (QID) | ORAL | Status: DC | PRN

## 2015-08-30 NOTE — Progress Notes (Signed)
Name: Dana Bradley   MRN: [id number]    DOB: 08-04-1971   Date:08/30/2015       Progress Note  Subjective  Chief Complaint  Chief Complaint  Patient presents with  . Sinusitis    symptoms started before Thanksgiving (around the 17th). Has tried Vick's salve.   . Medication Refill    zanaflex    HPI  Patient is here today with concerns regarding the following symptoms congestion, post nasal drip, ear pressure, sinus pressure and achiness that started 3 weeks ago.  Associated with fatigue and malaise. Has tried the following home remedies: Saline nasal rinse, Vick's vapor rub. Also reports cloudy urine. No dysuria or increased frequency or pelvic pain.    Past Medical History  Diagnosis Date  . Fibromyalgia   . Sleep apnea   . SVT (supraventricular tachycardia) ([place])   . Bursitis   . IBS (irritable bowel syndrome)   . Osteoarthritis   . Lumbar discitis   . Vertigo   . GERD (gastroesophageal reflux disease)     Social History  Substance Use Topics  . Smoking status: Former Smoker -- 4.00 packs/day for 3 years    Types: Cigarettes    Quit date: 12/10/1992  . Smokeless tobacco: Not on file  . Alcohol Use: No     Current outpatient prescriptions:  .  acetaminophen-codeine (TYLENOL/CODEINE #4) 300-60 MG tablet, Take 1 tablet by mouth every 6 (six) hours as needed for pain., Disp: 90 tablet, Rfl: 0 .  ALPRAZolam (XANAX) 1 MG tablet, Take 1 tablet (1 mg total) by mouth at bedtime as needed for anxiety., Disp: 90 tablet, Rfl: 1 .  amitriptyline (ELAVIL) 50 MG tablet, Take 2 tablets (100 mg total) by mouth at bedtime., Disp: 180 tablet, Rfl: 3 .  diclofenac sodium (VOLTAREN) 1 % GEL, Apply 4 g topically 4 (four) times daily., Disp: 300 g, Rfl: 3 .  HYDROcodone-acetaminophen (NORCO/VICODIN) 5-325 MG per tablet, Take 1 tablet by mouth every 6 (six) hours as needed for pain., Disp: 30 tablet, Rfl: 1 .  lamoTRIgine (LAMICTAL) 25 MG tablet, Take 2 tablets (50 mg total) by mouth 2 (two)  times daily., Disp: 360 tablet, Rfl: 3 .  metoprolol (LOPRESSOR) 50 MG tablet, Take 1 tablet (50 mg total) by mouth 2 (two) times daily., Disp: 180 tablet, Rfl: 3 .  omeprazole (PRILOSEC) 40 MG capsule, Take 1 capsule (40 mg total) by mouth daily., Disp: 30 capsule, Rfl: 3 .  ondansetron (ZOFRAN-ODT) 4 MG disintegrating tablet, Take 1 tablet (4 mg total) by mouth every 8 (eight) hours as needed for nausea or vomiting., Disp: 90 tablet, Rfl: 1 .  pregabalin (LYRICA) 200 MG capsule, Take 1 capsule (200 mg total) by mouth 3 (three) times daily., Disp: 90 capsule, Rfl: 5 .  SAVELLA 100 MG TABS tablet, TAKE 1 TABLET BY MOUTH TWICE A DAY, Disp: 60 tablet, Rfl: 5 .  SUMAtriptan (IMITREX) 100 MG tablet, Take 0.5 pill at headache onset.  May take a second dose after 2 hours if needed., Disp: , Rfl:  .  tiZANidine (ZANAFLEX) 2 MG tablet, Take 1 tablet (2 mg total) by mouth every 6 (six) hours as needed., Disp: 30 tablet, Rfl: 2 .  topiramate (TOPAMAX) 50 MG tablet, Take 1 tablet (50 mg total) by mouth 2 (two) times daily., Disp: 180 tablet, Rfl: 3  Allergies  Allergen Reactions  . Demerol [Meperidine] Nausea And Vomiting    Patient projectile vomits and usually result in ER  . Depakote [Divalproex  Sodium] Shortness Of Breath    W/ n/v  . Haloperidol Shortness Of Breath    W/ n/v  . Trazodone Shortness Of Breath  . Trazodone And Nefazodone Shortness Of Breath  . Aspirin Swelling  . Divalproex Sodium   . Haloperidol Lactate   . Iodinated Diagnostic Agents Other (See Comments)  . Latex Itching  . Meloxicam Other (See Comments)    mout sores, tingling  . Meperidine Hcl   . Nsaids Other (See Comments)  . Penicillins   . Sulfonamide Derivatives   . Bacitracin-Neomycin-Polymyxin Rash  . Cephalosporins Rash    rash  . Ibuprofen Other (See Comments) and Rash    Blisters in mouth. Blisters in mouth.  Doreatha Massed [Cephalexin] Rash  . Neosporin [Neomycin-Bacitracin Zn-Polymyx] Rash  . Sulfa Antibiotics  Rash    rash    ROS  Positive for fatigue, nasal congestion, sinus pressure, ear fullness, urinary color change mentioned in HPI, otherwise all systems reviewed and are negative.  Objective  Filed Vitals:   08/30/15 1023  BP: 110/74  Pulse: 115  Temp: 98.8 F (37.1 C)  TempSrc: Oral  Resp: 16  Weight: 208 lb 3.2 oz (94.439 kg)  SpO2: 97%   Body mass index is 35.72 kg/(m^2).   Physical Exam  Constitutional: Patient is obese and well-nourished. In no acute distress but does appear to be fatigued from acute illness. HEENT:  - Head: Normocephalic and atraumatic. Tenderness over bilateral maxillary sinuses.  - Ears: RIGHT TM bulging with minimal clear exudate, LEFT TM bulging with minimal clear exudate.  - Nose: Nasal mucosa boggy and congested.  - Mouth/Throat: Oropharynx is moist with slight erythema of bilateral tonsils without hypertrophy or exudates. Post nasal drainage present.  - Eyes: Conjunctivae clear, EOM movements normal. PERRLA. No scleral icterus.  Neck: Normal range of motion. Neck supple. No JVD present. No thyromegaly present. No local lymphadenopathy. Cardiovascular: Regular rate, regular rhythm with no murmurs heard.  Pulmonary/Chest: Effort normal and breath sounds clear in all lung fields.  Musculoskeletal: Normal range of motion bilateral UE and LE, no joint effusions. Skin: Skin is warm and dry. No rash noted. Psychiatric: Patient has a normal mood and affect. Behavior is normal in office today. Judgment and thought content normal in office today.  Results for orders placed or performed in visit on 08/30/15 (from the past 24 hour(s))  POCT urinalysis dipstick     Status: Normal   Collection Time: 08/30/15 10:40 AM  Result Value Ref Range   Color, UA AMBER    Clarity, UA CLOUDY    Glucose, UA NEGATIVE    Bilirubin, UA NEGATIVE    Ketones, UA NEGATIVE    Spec Grav, UA 1.010    Blood, UA NEGATIVE    pH, UA 7.0    Protein, UA NEGATIVE     Urobilinogen, UA 0.2    Nitrite, UA NEGATIVE    Leukocytes, UA Negative Negative     Assessment & Plan  1. Cloudy urine Udip unremarkable. Will culture. Otherwise instructed patient on increased hydration.  - POCT urinalysis dipstick - Urine Culture  2. Acute maxillary sinusitis, recurrence not specified Etiologies include initial allergic rhinitis or viral infection progressing to superimposed bacterial infection. Instructed patient on increasing hydration, nasal saline spray, steam inhalation, NSAID if tolerated and not contraindicated. If not already doing so start taking daily anti-histamine and use a steroid nasal spray. If symptoms persist/worsen may consider antibiotic therapy.  - azithromycin (ZITHROMAX) 500 MG tablet; Take 1 tablet (  500 mg total) by mouth daily.  Dispense: 7 tablet; Refill: 0  3. Muscle spasticity Refilled.  - tiZANidine (ZANAFLEX) 2 MG tablet; Take 1 tablet (2 mg total) by mouth every 6 (six) hours as needed.  Dispense: 60 tablet; Refill: 5

## 2015-08-31 LAB — URINE CULTURE: Organism ID, Bacteria: NO GROWTH

## 2015-12-13 ENCOUNTER — Telehealth: Payer: Self-pay | Admitting: Family Medicine

## 2015-12-13 DIAGNOSIS — M5481 Occipital neuralgia: Secondary | ICD-10-CM

## 2015-12-13 DIAGNOSIS — G8929 Other chronic pain: Secondary | ICD-10-CM

## 2015-12-13 DIAGNOSIS — M542 Cervicalgia: Secondary | ICD-10-CM

## 2015-12-13 NOTE — Telephone Encounter (Signed)
PT SAID THAT HER AND THE DR TALKED A FEW VISITS AGO ABOUT GETTING HER A REFERRAL FOR DR CHRISP ABOUT THE NERVE IN HER NECK AREA. NO AVAIL WAS HAD SO SHE SAID THAT YOU ALREADY KNOW ABOUT THIS ISSUE AND YOU SAID THAT YOU WOULD DO HER A REFERRAL.

## 2015-12-13 NOTE — Telephone Encounter (Signed)
Referral placed to Dr. Mohammed Kindle at pain management for chronic neck pain.

## 2016-04-29 ENCOUNTER — Encounter: Payer: Self-pay | Admitting: Family Medicine

## 2016-04-29 ENCOUNTER — Ambulatory Visit (INDEPENDENT_AMBULATORY_CARE_PROVIDER_SITE_OTHER): Payer: Medicaid Other | Admitting: Family Medicine

## 2016-04-29 VITALS — BP 126/82 | HR 99 | Temp 98.4°F | Resp 14 | Wt 210.0 lb

## 2016-04-29 DIAGNOSIS — F411 Generalized anxiety disorder: Secondary | ICD-10-CM | POA: Diagnosis not present

## 2016-04-29 DIAGNOSIS — M797 Fibromyalgia: Secondary | ICD-10-CM | POA: Diagnosis not present

## 2016-04-29 DIAGNOSIS — I471 Supraventricular tachycardia: Secondary | ICD-10-CM | POA: Diagnosis not present

## 2016-04-29 DIAGNOSIS — F3340 Major depressive disorder, recurrent, in remission, unspecified: Secondary | ICD-10-CM | POA: Diagnosis not present

## 2016-04-29 DIAGNOSIS — I10 Essential (primary) hypertension: Secondary | ICD-10-CM

## 2016-04-29 DIAGNOSIS — R5383 Other fatigue: Secondary | ICD-10-CM | POA: Diagnosis not present

## 2016-04-29 DIAGNOSIS — M5481 Occipital neuralgia: Secondary | ICD-10-CM

## 2016-04-29 DIAGNOSIS — R252 Cramp and spasm: Secondary | ICD-10-CM | POA: Diagnosis not present

## 2016-04-29 DIAGNOSIS — E669 Obesity, unspecified: Secondary | ICD-10-CM | POA: Insufficient documentation

## 2016-04-29 MED ORDER — PREGABALIN 200 MG PO CAPS: 200.0000 mg | ORAL_CAPSULE | Freq: Two times a day (BID) | ORAL | 2 refills | Status: DC

## 2016-04-29 MED ORDER — METOPROLOL TARTRATE 50 MG PO TABS: 50.0000 mg | ORAL_TABLET | Freq: Two times a day (BID) | ORAL | 3 refills | Status: DC

## 2016-04-29 NOTE — Assessment & Plan Note (Signed)
Not sure if bipolar or unipolar, refer to psychiatrist

## 2016-04-29 NOTE — Progress Notes (Signed)
BP 126/82   Pulse 99   Temp 98.4 F (36.9 C) (Oral)   Resp 14   Wt 210 lb (95.3 kg)   LMP 04/24/2016   SpO2 97%   BMI 36.05 kg/m    Subjective:    Patient ID: Dana Bradley, female    DOB: 05-Jan-1971, 45 y.o.   MRN: [id number]  HPI: Dana Bradley is a 45 y.o. female  Chief Complaint  Patient presents with  . Medication Refill   This is my first time seeing the patient here for a visit, though I have met her with other family members  She has SVT and needs metoprolol; has had ablation; she sees a [employer] cardiologist, Dr. Nehemiah Massed; everything was fine  She has chronic pain; uses Lyrica but has been taking one a day most days, sometimes skipping days; she is currently taking Lyrica 200 mg daily; has tingling in the hands; has had occipital nerve blocks; has been to pain clinic; has been on topamax for headaches, but not taking now; has pain on the left side of the neck worse than right; she has tylenol #4, has 61 pills left from the fall; uses 1/4 of a pill PRN; using elavil for pain and sleep and depression  She is having periods every 6 weeks, BTL and ablation for contraception so she is sure she isn't pregnant  She is out of Savella completely, ran out a few months ago, was stretching it out  She would like magnesium checked; leg cramps; slipped disc, lateral left thigh pain  She is out of Topamax; for headaches  She has been told she has borderline personality d/o, bipolar d/o, addictive behavior, depression, anxiety  She has hallucinations and uses the lamictal; she sees Dr. Manuella Ghazi, neurologist, and he does the medicine; seizure-like discharges, sensory or wrong color  She has Xanax 1 mg on her med list; she knows to not take that with the pain medicine; she cannot remember the last time she took it; she will go into a blind rage and she says everyone is safe right now; she does not want to damage any property; she will seek immediate help if needed   Depression screen [facility]  2/9 04/29/2016 08/30/2015 05/15/2015  Decreased Interest 2 0 0  Down, Depressed, Hopeless 3 0 0  PHQ - 2 Score 5 0 0  Altered sleeping 3 - -  Tired, decreased energy 3 - -  Change in appetite 3 - -  Feeling bad or failure about yourself  3 - -  Trouble concentrating 2 - -  Moving slowly or fidgety/restless 0 - -  Suicidal thoughts 1 - -  PHQ-9 Score 20 - -  Difficult doing work/chores Somewhat difficult - -    Relevant past medical, surgical, family and social history reviewed Past Medical History:  Diagnosis Date  . Bursitis   . Fibromyalgia   . GERD (gastroesophageal reflux disease)   . IBS (irritable bowel syndrome)   . Lumbar discitis   . Osteoarthritis   . Sleep apnea   . SVT (supraventricular tachycardia) ([place])   . Vertigo   . Vitamin D deficiency 05/01/2016   Past Surgical History:  Procedure Laterality Date  . ABLATION     Uterine  . CARDIAC CATHETERIZATION    . COLONOSCOPY WITH PROPOFOL N/A 05/17/2015   Procedure: COLONOSCOPY WITH PROPOFOL;  Surgeon: Manya Silvas, MD;  Location: [facility] ENDOSCOPY;  Service: Endoscopy;  Laterality: N/A;  . ESOPHAGOGASTRODUODENOSCOPY N/A 05/17/2015   Procedure: ESOPHAGOGASTRODUODENOSCOPY (  EGD);  Surgeon: Manya Silvas, MD;  Location: [facility] ENDOSCOPY;  Service: Endoscopy;  Laterality: N/A;  . KNEE ARTHROSCOPY    . TUBAL LIGATION  10/01/99   Family History  Problem Relation Age of Onset  . Depression Mother   . Hypertension Mother   . Cancer Mother     Skin  . Hyperlipidemia Mother   . Alcohol abuse Father   . Depression Father   . Stroke Father   . Heart disease Father   . Hypertension Father   . Depression Sister   . Hyperlipidemia Sister   . Diabetes Sister   . Cancer Maternal Grandmother 27    Breast  . Polycystic ovary syndrome Sister   . Alzheimer's disease     Social History  Substance Use Topics  . Smoking status: Former Smoker    Packs/day: 4.00    Years: 3.00    Types: Cigarettes    Quit date: 12/10/1992    . Smokeless tobacco: Not on file  . Alcohol use No   Interim medical history since last visit reviewed. Allergies and medications reviewed  Review of Systems Per HPI unless specifically indicated above     Objective:    BP 126/82   Pulse 99   Temp 98.4 F (36.9 C) (Oral)   Resp 14   Wt 210 lb (95.3 kg)   LMP 04/24/2016   SpO2 97%   BMI 36.05 kg/m   Wt Readings from Last 3 Encounters:  04/29/16 210 lb (95.3 kg)  08/30/15 208 lb 3.2 oz (94.4 kg)  07/12/15 210 lb 8 oz (95.5 kg)    Physical Exam  Constitutional: She appears well-developed and well-nourished. No distress.  HENT:  Head: Normocephalic and atraumatic.  Eyes: EOM are normal. No scleral icterus.  Neck: No thyromegaly present.  Cardiovascular: Normal rate, regular rhythm and normal heart sounds.   No murmur heard. Pulmonary/Chest: Effort normal and breath sounds normal. She has no wheezes.  Abdominal: Soft. Bowel sounds are normal. She exhibits no distension.  Musculoskeletal: Normal range of motion. She exhibits no edema.  Neurological: She is alert. She exhibits normal muscle tone.  Skin: Skin is warm and dry. She is not diaphoretic. No pallor.  Psychiatric: She has a normal mood and affect. Her behavior is normal. Judgment and thought content normal. Her mood appears not anxious. Her affect is not blunt and not inappropriate. Her speech is not rapid and/or pressured. Cognition and memory are not impaired. She does not express inappropriate judgment. She does not exhibit a depressed mood.  Talkative; good eye contact with examiner; distractible, jumps from topic to topic, but is redirectable; engaging and interactive but not manic; no HI or SI      Assessment & Plan:   Problem List Items Addressed This Visit      Cardiovascular and Mediastinum   Paroxysmal supraventricular tachycardia ([place]) - Primary    Has seen cardiologist; needs refills of beta-blocker      Relevant Medications   metoprolol  (LOPRESSOR) 50 MG tablet   Hypertension, benign essential, goal below 140/90   Relevant Medications   metoprolol (LOPRESSOR) 50 MG tablet     Musculoskeletal and Integument   Fibromyalgia affecting multiple sites   Relevant Medications   pregabalin (LYRICA) 200 MG capsule   Other Relevant Orders   Ambulatory referral to Pain Clinic   Fibromyalgia    Check vit D, start back on savella      Relevant Medications   pregabalin (LYRICA) 200  MG capsule   Other Relevant Orders   Ambulatory referral to Pain Clinic     Other   Obesity    Check lipids and TSH and glucose      Relevant Orders   Lipid panel (Completed)   GAD (generalized anxiety disorder)    Refer to psychiatrist      Relevant Orders   Ambulatory referral to Psychiatry   Fatigue    Check labs      Relevant Orders   Comprehensive metabolic panel (Completed)   VITAMIN D 25 Hydroxy (Vit-D Deficiency, Fractures) (Completed)   Vitamin B12 (Completed)   TSH (Completed)   CBC with Differential/Platelet (Completed)   Depression, major, recurrent, in remission ([place])    Not sure if bipolar or unipolar, refer to psychiatrist      Relevant Orders   Ambulatory referral to Psychiatry   Cervico-occipital neuralgia    Refer to pain clinic; continue the Lyrica, increase from 200 mg daily to 200 mg BID      Relevant Orders   Ambulatory referral to Pain Clinic   Bilateral leg cramps    Check Mg2+ and K+      Relevant Orders   Magnesium (Completed)    Other Visit Diagnoses   None.     Follow up plan: Return 1-2 weeks, for follow-up.  An after-visit summary was printed and given to the patient at [place].  Please see the patient instructions which may contain other information and recommendations beyond what is mentioned above in the assessment and plan.  Meds ordered this encounter  Medications  . metoprolol (LOPRESSOR) 50 MG tablet    Sig: Take 1 tablet (50 mg total) by mouth 2 (two) times daily.     Dispense:  180 tablet    Refill:  3  . pregabalin (LYRICA) 200 MG capsule    Sig: Take 1 capsule (200 mg total) by mouth 2 (two) times daily.    Dispense:  60 capsule    Refill:  2    Orders Placed This Encounter  Procedures  . Comprehensive metabolic panel  . Magnesium  . Lipid panel  . VITAMIN D 25 Hydroxy (Vit-D Deficiency, Fractures)  . Vitamin B12  . TSH  . CBC with Differential/Platelet  . Ambulatory referral to Pain Clinic  . Ambulatory referral to Psychiatry   Sleep, elavil, bleeding at next visit

## 2016-04-29 NOTE — Patient Instructions (Addendum)
I will defer the topamax and lamictal to Dr. Manuella Ghazi, neurologist We'll get labs done today Let's see you back in 1-2 weeks to finish today's discussion and go over other issues We'll have you see the pain clinic and the psychiatrist If you have any thoughts of harming yourself or others, STOP and call 911  Check out the information at familydoctor.org entitled "Nutrition for Weight Loss: What You Need to Know about Fad Diets" Try to lose between 1-2 pounds per week by taking in fewer calories and burning off more calories You can succeed by limiting portions, limiting foods dense in calories and fat, becoming more active, and drinking 8 glasses of water a day (64 ounces) Don't skip meals, especially breakfast, as skipping meals may alter your metabolism Do not use over-the-counter weight loss pills or gimmicks that claim rapid weight loss A healthy BMI (or body mass index) is between 18.5 and 24.9 You can calculate your ideal BMI at the [place] website [employer]fr

## 2016-04-29 NOTE — Assessment & Plan Note (Addendum)
Check Mg2+ and K+ 

## 2016-04-29 NOTE — Assessment & Plan Note (Signed)
Check lipids and TSH and glucose

## 2016-04-29 NOTE — Assessment & Plan Note (Signed)
Check vit D, start back on savella

## 2016-04-29 NOTE — Assessment & Plan Note (Signed)
Check labs 

## 2016-04-29 NOTE — Assessment & Plan Note (Signed)
Refer to psychiatrist 

## 2016-04-29 NOTE — Assessment & Plan Note (Signed)
Refer to pain clinic; continue the Lyrica, increase from 200 mg daily to 200 mg BID

## 2016-04-29 NOTE — Assessment & Plan Note (Signed)
Has seen cardiologist; needs refills of beta-blocker

## 2016-04-30 LAB — COMPREHENSIVE METABOLIC PANEL
ALT: 31 U/L — ABNORMAL HIGH (ref 6–29)
AST: 23 U/L (ref 10–30)
Albumin: 4.1 g/dL (ref 3.6–5.1)
Alkaline Phosphatase: 89 U/L (ref 33–115)
BUN: 8 mg/dL (ref 7–25)
CALCIUM: 9.1 mg/dL (ref 8.6–10.2)
CO2: 24 mmol/L (ref 20–31)
Chloride: 107 mmol/L (ref 98–110)
Creat: 0.85 mg/dL (ref 0.50–1.10)
GLUCOSE: 104 mg/dL — AB (ref 65–99)
POTASSIUM: 4 mmol/L (ref 3.5–5.3)
Sodium: 141 mmol/L (ref 135–146)
Total Bilirubin: 0.4 mg/dL (ref 0.2–1.2)
Total Protein: 6.4 g/dL (ref 6.1–8.1)

## 2016-04-30 LAB — CBC WITH DIFFERENTIAL/PLATELET
Basophils Absolute: 68 cells/uL (ref 0–200)
Basophils Relative: 1 %
Eosinophils Absolute: 136 cells/uL (ref 15–500)
Eosinophils Relative: 2 %
HEMATOCRIT: 40 % (ref 35.0–45.0)
Hemoglobin: 13.8 g/dL (ref 11.7–15.5)
LYMPHS PCT: 30 %
Lymphs Abs: 2040 cells/uL (ref 850–3900)
MCH: 31.7 pg (ref 27.0–33.0)
MCHC: 34.5 g/dL (ref 32.0–36.0)
MCV: 91.7 fL (ref 80.0–100.0)
MONO ABS: 612 {cells}/uL (ref 200–950)
MPV: 9.4 fL (ref 7.5–12.5)
Monocytes Relative: 9 %
NEUTROS PCT: 58 %
Neutro Abs: 3944 cells/uL (ref 1500–7800)
Platelets: 267 10*3/uL (ref 140–400)
RBC: 4.36 MIL/uL (ref 3.80–5.10)
RDW: 13.3 % (ref 11.0–15.0)
WBC: 6.8 10*3/uL (ref 3.8–10.8)

## 2016-04-30 LAB — LIPID PANEL
CHOL/HDL RATIO: 6 ratio — AB (ref <=5.0)
Cholesterol: 192 mg/dL (ref 125–200)
HDL: 32 mg/dL — AB (ref >=46)
LDL CALC: 102 mg/dL (ref <130)
TRIGLYCERIDES: 289 mg/dL — AB (ref <150)
VLDL: 58 mg/dL — AB (ref <30)

## 2016-04-30 LAB — TSH: TSH: 1.95 mIU/L

## 2016-04-30 LAB — MAGNESIUM: Magnesium: 2.1 mg/dL (ref 1.5–2.5)

## 2016-04-30 LAB — VITAMIN B12: Vitamin B-12: 348 pg/mL (ref 200–1100)

## 2016-05-01 ENCOUNTER — Other Ambulatory Visit: Payer: Self-pay | Admitting: Family Medicine

## 2016-05-01 ENCOUNTER — Encounter: Payer: Self-pay | Admitting: Family Medicine

## 2016-05-01 ENCOUNTER — Telehealth: Payer: Self-pay | Admitting: Family Medicine

## 2016-05-01 ENCOUNTER — Other Ambulatory Visit: Payer: Self-pay

## 2016-05-01 DIAGNOSIS — M797 Fibromyalgia: Secondary | ICD-10-CM

## 2016-05-01 DIAGNOSIS — F3340 Major depressive disorder, recurrent, in remission, unspecified: Secondary | ICD-10-CM

## 2016-05-01 DIAGNOSIS — E559 Vitamin D deficiency, unspecified: Secondary | ICD-10-CM

## 2016-05-01 DIAGNOSIS — D72820 Lymphocytosis (symptomatic): Secondary | ICD-10-CM

## 2016-05-01 HISTORY — DX: Vitamin D deficiency, unspecified: E55.9

## 2016-05-01 LAB — VITAMIN D 25 HYDROXY (VIT D DEFICIENCY, FRACTURES): Vit D, 25-Hydroxy: 17 ng/mL — ABNORMAL LOW (ref 30–100)

## 2016-05-01 MED ORDER — LAMOTRIGINE 100 MG PO TABS: 100.0000 mg | ORAL_TABLET | Freq: Every day | ORAL | 1 refills | Status: DC

## 2016-05-01 MED ORDER — VITAMIN D (ERGOCALCIFEROL) 1.25 MG (50000 UNIT) PO CAPS: 50000.0000 [IU] | ORAL_CAPSULE | ORAL | 1 refills | Status: AC

## 2016-05-01 NOTE — Assessment & Plan Note (Signed)
Check cbc and peripheral smear.  °

## 2016-05-01 NOTE — Telephone Encounter (Signed)
States cannot get in with DR shah fora couple of months?

## 2016-05-01 NOTE — Assessment & Plan Note (Signed)
Start vit D Rx weekly x 8 weeks, then OTC

## 2016-05-01 NOTE — Telephone Encounter (Signed)
Taking 100 mg daily; sending new Rx to simplify dosing

## 2016-05-06 ENCOUNTER — Telehealth: Payer: Self-pay | Admitting: Family Medicine

## 2016-05-06 DIAGNOSIS — G43001 Migraine without aura, not intractable, with status migrainosus: Secondary | ICD-10-CM

## 2016-05-06 NOTE — Assessment & Plan Note (Signed)
Refer to neurologist 

## 2016-05-06 NOTE — Telephone Encounter (Signed)
Certainly; referral entered

## 2016-05-07 ENCOUNTER — Ambulatory Visit: Payer: Medicaid Other | Admitting: Family Medicine

## 2016-05-14 ENCOUNTER — Encounter: Payer: Self-pay | Admitting: Family Medicine

## 2016-05-14 NOTE — Telephone Encounter (Signed)
COMPLETED

## 2016-05-22 ENCOUNTER — Ambulatory Visit: Payer: Self-pay | Admitting: Licensed Clinical Social Worker

## 2016-05-29 ENCOUNTER — Ambulatory Visit (INDEPENDENT_AMBULATORY_CARE_PROVIDER_SITE_OTHER): Payer: Medicaid Other | Admitting: Licensed Clinical Social Worker

## 2016-05-29 DIAGNOSIS — F411 Generalized anxiety disorder: Secondary | ICD-10-CM | POA: Diagnosis not present

## 2016-05-29 DIAGNOSIS — F329 Major depressive disorder, single episode, unspecified: Secondary | ICD-10-CM | POA: Diagnosis not present

## 2016-05-29 DIAGNOSIS — F4001 Agoraphobia with panic disorder: Secondary | ICD-10-CM | POA: Diagnosis not present

## 2016-05-29 DIAGNOSIS — F32A Depression, unspecified: Secondary | ICD-10-CM | POA: Insufficient documentation
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
## 2016-06-07 ENCOUNTER — Ambulatory Visit: Payer: Medicaid Other | Admitting: Family Medicine

## 2016-06-24 ENCOUNTER — Ambulatory Visit: Payer: Medicaid Other | Admitting: Psychiatry

## 2016-07-04 ENCOUNTER — Ambulatory Visit (INDEPENDENT_AMBULATORY_CARE_PROVIDER_SITE_OTHER): Payer: Medicaid Other | Admitting: Licensed Clinical Social Worker

## 2016-07-04 DIAGNOSIS — F411 Generalized anxiety disorder: Secondary | ICD-10-CM | POA: Diagnosis not present

## 2016-07-04 DIAGNOSIS — F4001 Agoraphobia with panic disorder: Secondary | ICD-10-CM | POA: Diagnosis not present

## 2016-07-04 DIAGNOSIS — F329 Major depressive disorder, single episode, unspecified: Secondary | ICD-10-CM | POA: Diagnosis not present

## 2016-07-04 DIAGNOSIS — F32A Depression, unspecified: Secondary | ICD-10-CM

## 2016-07-04 NOTE — Progress Notes (Signed)
THERAPIST PROGRESS NOTE  Session Time: 3 PM to 3:55 PM  Participation Level: Active  Behavioral Response: CasualAlertEuthymic  Type of Therapy: Individual Therapy  Treatment Goals addressed:  stabilize with mood so she does not have mood swings, learn coping strategies to manage stressors, help with mood and decrease panic.    Interventions: Solution Focused, Supportive, Anger Management Training, Family Systems and Other: Coping skills for anxiety and panic, healthy relationship skills  Summary: Dana Bradley is a 45 y.o. female who presents with "going out some". She got out for her 5 time since last session. She went [place] with husband for a few hours but she got a severe panic attack when she got back. She was nervous and shaky when out. She tries not to think about it but then catches up to her afterwards. Sometimes she gets sick too when she gets nervous. She related her husband wanted her to go shopping after coming back from farmers market and that is when she had panic. She describes symptoms that include difficulty breathing, she was unable to get out of the chair and felt something bad was going to happen if she did. She couldn't identify specifically what would happen but just thoughts that she "can't" move from the chair. Described another symptom of sleep paralysis, parasomnia, and he describes this as panic attack in sleep but not asleep. She describes the feeling with this of overwhelming dread. There is nothing there but can't breath, and can't move but hasn't had one in a long time. Skills trauma feels she is work through it but reports that it still will impact interactions. Discussed treatment plan and patient describes mood swings, panic attacks with getting out of the house and stressors with family relationships. She has tried to deal with family stressors but suppresses her feelings to a certain extent how she deals with family. She wants to deal with emotional  frustration and anger so that she is okay and they are okay. She explains that everything comes to her, she makes all the decisions and makes sure everything gets done for five people. She has asked family to do family chores but they still are not helping out. She would like more balance in her life, not have mood swings to anger and not give up in dealing with stressors. Discussed another problem in her relationship that husband is on the phone all the time and also related he deals with ADD issues. Related that the problems around conflict between son and husband have improved when she implemented ultimatum and expectation that they manage conflict without escalation.     Suicidal/Homicidal: No  Therapist Response: Provided positive feedback that patient has been making efforts to get out to encourage her to continue make progress. Discussed that stepping out of once comfort zone helps and grow and building skills and even if they are setbacks one can still learn from the experience. Explained an effective strategy for dealing with panic is exposure where one gains more confidence in one's ability to handle situations, builds motivation to want to engage in positive activities and patient's experience helps to build skills in managing situations around her anxiety. Discussed unhealthy functioning and family systems and steps patient could take to start to make changes. Discussed issues in her relationship and the application of healthy relationship skills to help make some positive changes. Explained that one has to nurture and put effort into relationships and that patient may be able to provide education to  the family and her husband to help make improvements.  Plan: Return again in 2 weeks.2. Patient came insight and implement healthy relationship skills and skills to help help in healthier family functioning.3. Patient learn and apply strategies to help with emotional regulation  Diagnosis: Axis I:   panic disorder with agoraphobia, depression, unspecified depressive type, generalized anxiety disorder    Axis II: No diagnosis    Bowman,Mary A, LCSW 07/04/2016

## 2016-07-11 ENCOUNTER — Ambulatory Visit: Payer: Self-pay | Admitting: Psychiatry

## 2016-07-18 ENCOUNTER — Ambulatory Visit: Payer: Medicaid Other | Admitting: Licensed Clinical Social Worker

## 2016-07-22 ENCOUNTER — Ambulatory Visit: Payer: Medicaid Other | Admitting: Psychiatry

## 2016-07-23 ENCOUNTER — Other Ambulatory Visit: Payer: Self-pay | Admitting: Family Medicine

## 2016-07-25 NOTE — Telephone Encounter (Signed)
Pt stated it was the Pharmacy mistake. It was an automatic refill and she couldn't hear who they were asking for and she hit yes. She is aware that she is no longer taking the 50,000 units of Vit  D3. She will start taking OVC Vit D3.

## 2016-07-25 NOTE — Telephone Encounter (Signed)
Request for Rx vitamin D denied Please have patient take 1,000 iu of vitamin D3 over-the-counter once a day Thank you

## 2016-08-07 ENCOUNTER — Encounter: Payer: Self-pay | Admitting: Pain Medicine

## 2016-08-07 ENCOUNTER — Ambulatory Visit: Payer: Self-pay | Admitting: Pain Medicine

## 2016-08-07 ENCOUNTER — Encounter (INDEPENDENT_AMBULATORY_CARE_PROVIDER_SITE_OTHER): Payer: Self-pay

## 2016-08-07 ENCOUNTER — Ambulatory Visit: Payer: Medicaid Other | Attending: Pain Medicine | Admitting: Pain Medicine

## 2016-08-07 VITALS — BP 139/77 | HR 96 | Temp 97.8°F | Resp 18 | Ht 63.0 in | Wt 215.0 lb

## 2016-08-07 DIAGNOSIS — M79603 Pain in arm, unspecified: Secondary | ICD-10-CM

## 2016-08-07 DIAGNOSIS — M25511 Pain in right shoulder: Secondary | ICD-10-CM | POA: Diagnosis not present

## 2016-08-07 DIAGNOSIS — Z79899 Other long term (current) drug therapy: Secondary | ICD-10-CM | POA: Diagnosis not present

## 2016-08-07 DIAGNOSIS — M4712 Other spondylosis with myelopathy, cervical region: Secondary | ICD-10-CM | POA: Diagnosis not present

## 2016-08-07 DIAGNOSIS — M4722 Other spondylosis with radiculopathy, cervical region: Secondary | ICD-10-CM | POA: Insufficient documentation

## 2016-08-07 DIAGNOSIS — F339 Major depressive disorder, recurrent, unspecified: Secondary | ICD-10-CM | POA: Diagnosis not present

## 2016-08-07 DIAGNOSIS — M5442 Lumbago with sciatica, left side: Secondary | ICD-10-CM

## 2016-08-07 DIAGNOSIS — M25571 Pain in right ankle and joints of right foot: Secondary | ICD-10-CM | POA: Diagnosis not present

## 2016-08-07 DIAGNOSIS — R1031 Right lower quadrant pain: Secondary | ICD-10-CM | POA: Insufficient documentation

## 2016-08-07 DIAGNOSIS — M25512 Pain in left shoulder: Secondary | ICD-10-CM | POA: Insufficient documentation

## 2016-08-07 DIAGNOSIS — M542 Cervicalgia: Secondary | ICD-10-CM

## 2016-08-07 DIAGNOSIS — G894 Chronic pain syndrome: Secondary | ICD-10-CM | POA: Diagnosis not present

## 2016-08-07 DIAGNOSIS — E669 Obesity, unspecified: Secondary | ICD-10-CM | POA: Insufficient documentation

## 2016-08-07 DIAGNOSIS — Z79891 Long term (current) use of opiate analgesic: Secondary | ICD-10-CM | POA: Diagnosis not present

## 2016-08-07 DIAGNOSIS — M5481 Occipital neuralgia: Secondary | ICD-10-CM

## 2016-08-07 DIAGNOSIS — G8929 Other chronic pain: Secondary | ICD-10-CM | POA: Insufficient documentation

## 2016-08-07 DIAGNOSIS — M792 Neuralgia and neuritis, unspecified: Secondary | ICD-10-CM

## 2016-08-07 DIAGNOSIS — M5414 Radiculopathy, thoracic region: Secondary | ICD-10-CM

## 2016-08-07 DIAGNOSIS — M25572 Pain in left ankle and joints of left foot: Secondary | ICD-10-CM

## 2016-08-07 DIAGNOSIS — Z6838 Body mass index (BMI) 38.0-38.9, adult: Secondary | ICD-10-CM | POA: Insufficient documentation

## 2016-08-07 DIAGNOSIS — M797 Fibromyalgia: Secondary | ICD-10-CM | POA: Diagnosis present

## 2016-08-07 DIAGNOSIS — Z87891 Personal history of nicotine dependence: Secondary | ICD-10-CM | POA: Diagnosis not present

## 2016-08-07 DIAGNOSIS — M5412 Radiculopathy, cervical region: Secondary | ICD-10-CM

## 2016-08-07 DIAGNOSIS — I10 Essential (primary) hypertension: Secondary | ICD-10-CM | POA: Diagnosis not present

## 2016-08-07 DIAGNOSIS — E559 Vitamin D deficiency, unspecified: Secondary | ICD-10-CM | POA: Insufficient documentation

## 2016-08-07 DIAGNOSIS — M791 Myalgia: Secondary | ICD-10-CM

## 2016-08-07 DIAGNOSIS — M25562 Pain in left knee: Secondary | ICD-10-CM

## 2016-08-07 DIAGNOSIS — M79601 Pain in right arm: Secondary | ICD-10-CM

## 2016-08-07 DIAGNOSIS — M7918 Myalgia, other site: Secondary | ICD-10-CM

## 2016-08-07 DIAGNOSIS — M549 Dorsalgia, unspecified: Secondary | ICD-10-CM

## 2016-08-07 DIAGNOSIS — M5441 Lumbago with sciatica, right side: Secondary | ICD-10-CM

## 2016-08-07 DIAGNOSIS — G4700 Insomnia, unspecified: Secondary | ICD-10-CM | POA: Insufficient documentation

## 2016-08-07 DIAGNOSIS — K589 Irritable bowel syndrome without diarrhea: Secondary | ICD-10-CM | POA: Insufficient documentation

## 2016-08-07 DIAGNOSIS — F411 Generalized anxiety disorder: Secondary | ICD-10-CM | POA: Insufficient documentation

## 2016-08-07 DIAGNOSIS — G4733 Obstructive sleep apnea (adult) (pediatric): Secondary | ICD-10-CM | POA: Diagnosis not present

## 2016-08-07 DIAGNOSIS — M25561 Pain in right knee: Secondary | ICD-10-CM

## 2016-08-07 DIAGNOSIS — F1190 Opioid use, unspecified, uncomplicated: Secondary | ICD-10-CM

## 2016-08-07 DIAGNOSIS — K219 Gastro-esophageal reflux disease without esophagitis: Secondary | ICD-10-CM | POA: Insufficient documentation

## 2016-08-07 DIAGNOSIS — M79602 Pain in left arm: Secondary | ICD-10-CM

## 2016-08-07 NOTE — Progress Notes (Signed)
Safety precautions to be maintained throughout the outpatient stay will include: orient to surroundings, keep bed in low position, maintain call bell within reach at all times, provide assistance with transfer out of bed and ambulation.  

## 2016-08-07 NOTE — Progress Notes (Signed)
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 MD;  Location: [facility] ENDOSCOPY;  Service: Endoscopy;  Laterality: N/A;  . ESOPHAGOGASTRODUODENOSCOPY N/A 05/17/2015   Procedure: ESOPHAGOGASTRODUODENOSCOPY (EGD);  Surgeon: Manya Silvas, MD;  Location: [facility] ENDOSCOPY;  Service: Endoscopy;  Laterality: N/A;  . KNEE ARTHROSCOPY    . TUBAL LIGATION  10/01/99   Active Ambulatory Problems    Diagnosis Date Noted  . Depression, major, recurrent, in remission ([place]) 01/12/2009  . Hypertension, benign essential, goal below 140/90 06/23/2008  . History of PSVT (paroxysmal supraventricular tachycardia) 06/17/2008  . GERD 06/13/2008  . Obstructive sleep apnea, adult 06/17/2008  . Menorrhagia 12/10/2012  . Cervico-occipital neuralgia 12/29/2013  . DDD (degenerative disc disease), lumbosacral 01/24/2014  . Paroxysmal supraventricular tachycardia ([place]) 04/17/2015  . Chronic pain of multiple joints 05/15/2015  . Chronic superficial gastritis 06/02/2015  . Degeneration of intervertebral disc of lumbosacral region 01/24/2014  . Fibromyalgia 12/29/2013  . Insomnia 07/12/2015  . Migraine without aura and with  status migrainosus, not intractable 07/12/2015  . Bilateral leg cramps 04/29/2016  . Obesity 04/29/2016  . GAD (generalized anxiety disorder) 04/29/2016  . Fatigue 04/29/2016  . Atypical lymphocytosis 05/01/2016  . Vitamin D deficiency 05/01/2016  . Panic disorder with agoraphobia 05/29/2016  . Depression, unspecified depression type 05/29/2016  . Chronic pain syndrome 08/07/2016  . Long term current use of opiate analgesic 08/07/2016  . Long term prescription opiate use 08/07/2016  . Opiate use 08/07/2016  . Long term prescription benzodiazepine use 08/07/2016  . Neurogenic pain  08/07/2016  . Musculoskeletal pain 08/07/2016  . Chronic low back pain (Location of Primary Source of Pain) (Bilateral) (R>L) (midline) 08/07/2016  . Chronic upper back pain (Location of Secondary source of pain) (Bilateral) (L>R) 08/07/2016  . Chronic right lower quadrant pain 08/07/2016  . Thoracic radiculitis (Right) (T11 dermatome) 08/07/2016  . Chronic occipital neuralgia (Location of Tertiary source of pain) (Bilateral) (L>R) 08/07/2016  . Chronic neck pain 08/07/2016  . Chronic cervical radicular pain (Bilateral) (L>R) 08/07/2016  . Chronic shoulder blade pain (Bilateral) (L>R) 08/07/2016  . Chronic upper extremity pain (Bilateral) (R>L) 08/07/2016  . Chronic knee pain (Bilateral) (R>L) 08/07/2016  . Chronic ankle pain (Bilateral) (R>L) 08/07/2016  . Cervical spondylosis with myelopathy and radiculopathy 08/07/2016   Resolved Ambulatory Problems    Diagnosis Date Noted  . PANIC ATTACK 06/23/2008  . SINUSITIS, ACUTE 08/18/2009  . UPPER RESPIRATORY INFECTION, ACUTE 06/30/2009  . Microscopic hematuria 06/15/2009  . BREAST TENDERNESS 01/12/2009  . BACTERIAL VAGINITIS 06/15/2009  . Premenstrual tension syndromes 01/30/2009  . CONTACT DERMATITIS 06/09/2009  . Knee pain, bilateral 12/21/2008  . VERTIGO 06/17/2008  . Headache(784.0) 05/15/2007  . 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 Right hand pain  04/10/2015  . Edema leg 05/02/2015  . Bleeding per rectum 04/06/2015  . Obstructive apnea 04/17/2015  . Breathlessness on exertion 04/17/2015  . Cloudy urine 08/30/2015  . Acute maxillary sinusitis 08/30/2015   Past Medical History:  Diagnosis Date  . Bursitis   . Edema leg 05/02/2015  . Fibromyalgia   . GERD (gastroesophageal reflux disease)   . IBS (irritable bowel syndrome)   . Knee pain, bilateral 12/21/2008  . Lumbar discitis   . Osteoarthritis   . Right hand pain 04/10/2015  . Sleep apnea   . SVT (supraventricular tachycardia) ([place])   . Vertigo   . Vitamin D deficiency 05/01/2016   Constitutional Exam  General appearance: Well nourished, well developed, and well hydrated. In no apparent acute distress Vitals:   08/07/16 1134  BP: 139/77  Pulse: 96  Resp: 18  Temp: 97.8 F (36.6 C)  TempSrc: Oral  SpO2: 100%  Weight: 215 lb (97.5 kg)  Height: '5\' 3"'$  (1.6 m)   BMI Assessment: Estimated body mass index is 38.09 kg/m as calculated from the following:   Height as of this encounter: '5\' 3"'$  (1.6 m).   Weight as of this encounter: 215 lb (97.5 kg).  BMI interpretation table: BMI level Category Range association with higher incidence of chronic pain  <18 kg/m2 Underweight   18.5-24.9 kg/m2 Ideal body weight   25-29.9 kg/m2 Overweight Increased incidence by 20%  30-34.9 kg/m2 Obese (Class I) Increased incidence by 68%  35-39.9 kg/m2 Severe obesity (Class II) Increased incidence by 136%  >40 kg/m2 Extreme obesity (Class III) Increased incidence by 254%   BMI Readings from Last 4 Encounters:  08/07/16 38.09 kg/m  04/29/16 36.05 kg/m  08/30/15 35.74 kg/m  07/12/15 36.13 kg/m   Wt Readings from Last 4 Encounters:  08/07/16 215 lb (97.5 kg)  04/29/16 210 lb (95.3 kg)  08/30/15 208 lb 3.2 oz (94.4 kg)  07/12/15 210 lb 8 oz (95.5 kg)  Psych/Mental status: Alert, oriented x 3 (person, place, & time) Eyes: PERLA Respiratory: No evidence of acute respiratory  distress  Cervical Spine Exam  Inspection: No masses, redness, or swelling Alignment: Symmetrical Functional ROM: Unrestricted ROM Stability: No instability detected Muscle strength & Tone: Functionally intact Sensory: Unimpaired Palpation: Non-contributory  Upper Extremity (UE) Exam    Side: Right  upper extremity  Side: Left upper extremity  Inspection: No masses, redness, swelling, or asymmetry  Inspection: No masses, redness, swelling, or asymmetry  Functional ROM: Unrestricted ROM         Functional ROM: Unrestricted ROM          Muscle strength & Tone: Functionally intact  Muscle strength & Tone: Functionally intact  Sensory: Unimpaired  Sensory: Unimpaired  Palpation: Non-contributory  Palpation: Non-contributory   Thoracic Spine Exam  Inspection: No masses, redness, or swelling Alignment: Symmetrical Functional ROM: Unrestricted ROM Stability: No instability detected Sensory: Unimpaired Muscle strength & Tone: Functionally intact Palpation: Non-contributory  Lumbar Spine Exam  Inspection: No masses, redness, or swelling Alignment: Symmetrical Functional ROM: Unrestricted ROM Stability: No instability detected Muscle strength & Tone: Functionally intact Sensory: Unimpaired Palpation: Non-contributory Provocative Tests: Lumbar Hyperextension and rotation test: evaluation deferred today       Patrick's Maneuver: evaluation deferred today              Gait & Posture Assessment  Ambulation: Unassisted Gait: Relatively normal for age and body habitus Posture: WNL   Lower Extremity Exam    Side: Right lower extremity  Side: Left lower extremity  Inspection: No masses, redness, swelling, or asymmetry  Inspection: No masses, redness, swelling, or asymmetry  Functional ROM: Unrestricted ROM          Functional ROM: Unrestricted ROM          Muscle strength & Tone: Functionally intact  Muscle strength & Tone: Functionally intact  Sensory: Unimpaired  Sensory: Unimpaired   Palpation: Non-contributory  Palpation: Non-contributory   Assessment  Primary Diagnosis & Pertinent Problem List: The primary encounter diagnosis was Chronic pain syndrome. Diagnoses of Long term current use of opiate analgesic, Long term prescription opiate use, Opiate use, Long term prescription benzodiazepine use, Neurogenic pain, Musculoskeletal pain, Chronic low back pain (Location of Primary Source of Pain) (Bilateral) (R>L) (midline), Chronic upper back pain (Location of Secondary source of pain) (Bilateral) (L>R), Chronic right lower quadrant pain, Thoracic radiculitis (Right) (T11 dermatome), Chronic occipital neuralgia (Location of Tertiary source of pain) (Bilateral) (L>R), Chronic neck pain, Chronic cervical radicular pain (Bilateral) (L>R), Chronic shoulder blade pain (Bilateral) (L>R), Chronic pain of both upper extremities, Chronic knee pain (Bilateral) (R>L), Chronic ankle pain (Bilateral) (R>L), and Cervical spondylosis with myelopathy and radiculopathy were also pertinent to this visit.  Visit Diagnosis: 1. Chronic pain syndrome   2. Long term current use of opiate analgesic   3. Long term prescription opiate use   4. Opiate use   5. Long term prescription benzodiazepine use   6. Neurogenic pain   7. Musculoskeletal pain   8. Chronic low back pain (Location of Primary Source of Pain) (Bilateral) (R>L) (midline)   9. Chronic upper back pain (Location of Secondary source of pain) (Bilateral) (L>R)   10. Chronic right lower quadrant pain   11. Thoracic radiculitis (Right) (T11 dermatome)   12. Chronic occipital neuralgia (Location of Tertiary source of pain) (Bilateral) (L>R)   13. Chronic neck pain   14. Chronic cervical radicular pain (Bilateral) (L>R)   15. Chronic shoulder blade pain (Bilateral) (L>R)   16. Chronic pain of both upper extremities   17. Chronic knee pain (Bilateral) (R>L)   18. Chronic ankle pain (Bilateral) (R>L)   19. Cervical spondylosis with  myelopathy and radiculopathy    Plan of Care  Initial treatment plan:  Please be advised that as per protocol, today's visit has been an evaluation only.  We have not taken over the patient's controlled substance management.  Problem-specific plan: No problem-specific Assessment & Plan notes found for this encounter.  Ordered Lab-work, Procedure(s), Referral(s), & Consult(s): Orders Placed This Encounter  Procedures  . DG Cervical Spine Complete  . DG Thoracic Spine 2 View  . DG Lumbar Spine Complete W/Bend  . DG Knee 1-2 Views Left  . DG Knee 1-2 Views Right  . DG Shoulder Left  . DG Shoulder Right  . Compliance Drug Analysis, Ur  . Comprehensive metabolic panel  . C-reactive protein  . Magnesium  . Sedimentation rate  . Vitamin B12  . 25-Hydroxyvitamin D Lcms D2+D3  . Ambulatory referral to Psychology   Pharmacotherapy: Medications ordered:  No orders of the defined types were placed in this encounter.  Medications administered during this visit: Ms. Pellegrin had no medications administered during this visit.   Pharmacotherapy under consideration:  Opioid Analgesics: The patient was informed that there is no guarantee that she would be a candidate for opioid analgesics. The decision will be made following CDC guidelines. This decision will be based on the results of diagnostic studies, as well as Ms. Farone's risk profile.  Membrane stabilizer: Continue on Lyrica and consider the possibility of going to 450 mg per day Muscle relaxant: To be determined at a later time NSAID: Not indicated",Contraindicated due to medical causes Non-opioid analgesic: To be determined at a later time   Interventional therapies under consideration: Ms. Gaccione was informed that there is no guarantee that she would be a candidate for interventional therapies. The decision will be based on the results of diagnostic studies, as well as Ms. Needles's risk profile.  Possible procedure(s): Palliative left  L4-5 lumbar epidural steroid injections under fluoroscopic guidance. Diagnostic bilateral lumbar facet block under fluoroscopic guidance and IV sedation.  Possible bilateral lumbar facet radiofrequency ablation. Diagnostic thoracic facet block under fluoroscopic guidance and IV sedation.  Possible bilateral thoracic facet radiofrequency ablation. Left cervical epidural steroid injections under fluoroscopic guidance and IV sedation.  Diagnostic bilateral cervical facet blocks under fluoroscopic guidance and IV sedation.  Possible bilateral cervical facet radiofrequency ablation. Diagnostic bilateral lesser occipital nerve blocks under fluoroscopic guidance and IV sedation.  Diagnostic bilateral C2 and TON nerve block under fluoroscopic guidance and IV sedation. Possible bilateral occipital nerve radiofrequency ablation. Diagnostic right T10-11  thoracic epidural steroid injection under fluoroscopic guidance, with or without sedation.  Diagnostic bilateral suprascapular nerve block under fluoroscopic guidance, with or without sedation.  Diagnostic bilateral intra-articular knee joint injection, without fluoroscopy or IV sedation. Possible bilateral intra-articular Hyalgan knee injections.  Diagnostic bilateral Genicular nerve block under fluoroscopic guidance and IV sedation.  Possible bilateral Genicular nerve radiofrequency ablation.    Provider-requested follow-up: Return for 2nd Visit, after [employer] Eval.  Future Appointments Date Time Provider [place]  08/12/2016 2:20 PM Arnetha Courser, MD [place] None    Primary Care Physician: Enid Derry, MD Location: [facility] Note by: Kathlen Brunswick. Dossie Arbour, M.D, DABA, DABAPM, DABPM, DABIPP, FIPP  Pain Score Disclaimer: We use the NRS-11 scale. This is a self-reported, subjective measurement of pain severity with only modest accuracy. It is used primarily to identify changes within a particular patient.  It must be understood that outpatient pain scales are significantly less accurate that those used for research, where they can be applied under ideal controlled circumstances with minimal exposure to variables. In reality, the score is likely to be a combination of pain intensity and pain affect, where  pain affect describes the degree of emotional arousal or changes in action readiness caused by the sensory experience of pain. Factors such as social and work situation, setting, emotional state, anxiety levels, expectation, and prior pain experience may influence pain perception and show large inter-individual differences that may also be affected by time variables.  Patient instructions provided during this appointment: There are no Patient Instructions on file for this visit.

## 2016-08-08 ENCOUNTER — Ambulatory Visit
Admission: RE | Admit: 2016-08-08 | Discharge: 2016-08-08 | Disposition: A | Discharge status: Home / Self Care | Payer: Medicaid Other | Source: Ambulatory Visit | Attending: Pain Medicine | Admitting: Pain Medicine

## 2016-08-08 ENCOUNTER — Other Ambulatory Visit
Admission: RE | Admit: 2016-08-08 | Discharge: 2016-08-08 | Disposition: A | Discharge status: Home / Self Care | Payer: Medicaid Other | Source: Ambulatory Visit | Attending: Pain Medicine | Admitting: Pain Medicine

## 2016-08-08 DIAGNOSIS — M5414 Radiculopathy, thoracic region: Secondary | ICD-10-CM

## 2016-08-08 DIAGNOSIS — M542 Cervicalgia: Secondary | ICD-10-CM | POA: Insufficient documentation

## 2016-08-08 DIAGNOSIS — M79601 Pain in right arm: Secondary | ICD-10-CM | POA: Insufficient documentation

## 2016-08-08 DIAGNOSIS — R1031 Right lower quadrant pain: Secondary | ICD-10-CM

## 2016-08-08 DIAGNOSIS — M79602 Pain in left arm: Secondary | ICD-10-CM | POA: Diagnosis not present

## 2016-08-08 DIAGNOSIS — M25562 Pain in left knee: Secondary | ICD-10-CM | POA: Diagnosis not present

## 2016-08-08 DIAGNOSIS — M47896 Other spondylosis, lumbar region: Secondary | ICD-10-CM | POA: Insufficient documentation

## 2016-08-08 DIAGNOSIS — M5481 Occipital neuralgia: Secondary | ICD-10-CM | POA: Insufficient documentation

## 2016-08-08 DIAGNOSIS — M25511 Pain in right shoulder: Secondary | ICD-10-CM | POA: Insufficient documentation

## 2016-08-08 DIAGNOSIS — M25512 Pain in left shoulder: Secondary | ICD-10-CM | POA: Insufficient documentation

## 2016-08-08 DIAGNOSIS — M25561 Pain in right knee: Secondary | ICD-10-CM | POA: Insufficient documentation

## 2016-08-08 DIAGNOSIS — M12811 Other specific arthropathies, not elsewhere classified, right shoulder: Secondary | ICD-10-CM | POA: Insufficient documentation

## 2016-08-08 DIAGNOSIS — G894 Chronic pain syndrome: Secondary | ICD-10-CM | POA: Insufficient documentation

## 2016-08-08 DIAGNOSIS — M5442 Lumbago with sciatica, left side: Secondary | ICD-10-CM

## 2016-08-08 DIAGNOSIS — G8929 Other chronic pain: Secondary | ICD-10-CM

## 2016-08-08 DIAGNOSIS — M47894 Other spondylosis, thoracic region: Secondary | ICD-10-CM | POA: Diagnosis not present

## 2016-08-08 DIAGNOSIS — M549 Dorsalgia, unspecified: Secondary | ICD-10-CM

## 2016-08-08 DIAGNOSIS — M5441 Lumbago with sciatica, right side: Secondary | ICD-10-CM

## 2016-08-08 DIAGNOSIS — M5412 Radiculopathy, cervical region: Secondary | ICD-10-CM

## 2016-08-08 LAB — COMPREHENSIVE METABOLIC PANEL
ALK PHOS: 82 U/L (ref 38–126)
ALT: 28 U/L (ref 14–54)
ANION GAP: 7 (ref 5–15)
AST: 25 U/L (ref 15–41)
Albumin: 4.1 g/dL (ref 3.5–5.0)
BUN: 10 mg/dL (ref 6–20)
CALCIUM: 9.4 mg/dL (ref 8.9–10.3)
CHLORIDE: 107 mmol/L (ref 101–111)
CO2: 27 mmol/L (ref 22–32)
CREATININE: 0.77 mg/dL (ref 0.44–1.00)
Glucose, Bld: 100 mg/dL — ABNORMAL HIGH (ref 65–99)
Potassium: 4.1 mmol/L (ref 3.5–5.1)
SODIUM: 141 mmol/L (ref 135–145)
Total Bilirubin: 0.1 mg/dL — ABNORMAL LOW (ref 0.3–1.2)
Total Protein: 6.9 g/dL (ref 6.5–8.1)

## 2016-08-08 LAB — SEDIMENTATION RATE: SED RATE: 14 mm/h (ref 0–20)

## 2016-08-08 LAB — MAGNESIUM: MAGNESIUM: 2.2 mg/dL (ref 1.7–2.4)

## 2016-08-08 LAB — VITAMIN B12: Vitamin B-12: 183 pg/mL (ref 180–914)

## 2016-08-08 LAB — C-REACTIVE PROTEIN: CRP: 1.4 mg/dL — AB (ref <1.0)

## 2016-08-12 ENCOUNTER — Telehealth: Payer: Self-pay

## 2016-08-12 ENCOUNTER — Other Ambulatory Visit: Payer: Self-pay

## 2016-08-12 ENCOUNTER — Other Ambulatory Visit: Payer: Self-pay | Admitting: Pain Medicine

## 2016-08-12 ENCOUNTER — Ambulatory Visit: Payer: Self-pay | Admitting: Family Medicine

## 2016-08-12 DIAGNOSIS — M5442 Lumbago with sciatica, left side: Secondary | ICD-10-CM

## 2016-08-12 DIAGNOSIS — G8929 Other chronic pain: Secondary | ICD-10-CM

## 2016-08-12 DIAGNOSIS — M5441 Lumbago with sciatica, right side: Secondary | ICD-10-CM

## 2016-08-12 DIAGNOSIS — N200 Calculus of kidney: Secondary | ICD-10-CM

## 2016-08-12 LAB — 25-HYDROXY VITAMIN D LCMS D2+D3: 25-Hydroxy, Vitamin D-3: 10 ng/mL

## 2016-08-12 LAB — 25-HYDROXYVITAMIN D LCMS D2+D3
25-HYDROXY, VITAMIN D-2: 15 ng/mL
25-HYDROXY, VITAMIN D: 25 ng/mL — AB

## 2016-08-12 NOTE — Telephone Encounter (Signed)
Patient notified that we would be putting in an Urology consult per Dr Dossie Arbour.  Patient states that she would like it to be at [place].  Could you please take care of this referral.  Thank you.

## 2016-08-14 NOTE — Telephone Encounter (Signed)
Dana Bradley is sending this referral today

## 2016-08-17 LAB — COMPLIANCE DRUG ANALYSIS, UR

## 2016-08-19 ENCOUNTER — Encounter: Payer: Self-pay | Admitting: Urology

## 2016-08-19 ENCOUNTER — Ambulatory Visit (INDEPENDENT_AMBULATORY_CARE_PROVIDER_SITE_OTHER): Payer: Medicaid Other | Admitting: Urology

## 2016-08-19 VITALS — BP 107/71 | HR 71 | Ht 63.0 in | Wt 208.7 lb

## 2016-08-19 DIAGNOSIS — N200 Calculus of kidney: Secondary | ICD-10-CM | POA: Diagnosis not present

## 2016-08-19 DIAGNOSIS — N951 Menopausal and female climacteric states: Secondary | ICD-10-CM

## 2016-08-19 DIAGNOSIS — N3946 Mixed incontinence: Secondary | ICD-10-CM | POA: Diagnosis not present

## 2016-08-19 LAB — URINALYSIS, COMPLETE
BILIRUBIN UA: NEGATIVE
Glucose, UA: NEGATIVE
Ketones, UA: NEGATIVE
LEUKOCYTES UA: NEGATIVE
Nitrite, UA: NEGATIVE
PH UA: 5 (ref 5.0–7.5)
PROTEIN UA: NEGATIVE
Specific Gravity, UA: <1.005 — ABNORMAL LOW (ref 1.005–1.030)
Urobilinogen, Ur: 0.2 mg/dL (ref 0.2–1.0)

## 2016-08-19 LAB — MICROSCOPIC EXAMINATION
BACTERIA UA: NONE SEEN
WBC UA: NONE SEEN /HPF (ref 0–?)
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
## 2016-08-19 NOTE — Progress Notes (Signed)
In and Out Catheterization  Patient is present today for a I & O catheterization due to [facility] wanting to make sure blood in urine is directly coming from the bladder or not. Patient was cleaned and prepped in a sterile fashion with betadine and Lidocaine 2% jelly was instilled into the urethra.  A 14FR cath was inserted no complications were noted , 275ml of urine return was noted, urine was light yellow  in color. A clean urine sample was collected for Urinalysis. Bladder was drained  and catheter was removed with out difficulty.    Preformed by: Lyndee Hensen CMA

## 2016-08-21 LAB — CULTURE, URINE COMPREHENSIVE

## 2016-08-24 ENCOUNTER — Other Ambulatory Visit: Payer: Self-pay | Admitting: Family Medicine

## 2016-08-24 DIAGNOSIS — M797 Fibromyalgia: Secondary | ICD-10-CM

## 2016-08-24 NOTE — Telephone Encounter (Signed)
[place] web site reviewed Please contact patient I saw pt in July She needs to be seen every 3 months for controlled substances She canceled two appts since July and no showed one appt I'll allow 7 days of medicine, but she must schedule and keep an appt every 3 months for this drug, or we'll ask Dr. Dossie Arbour to take this prescription over Back to me after she schedules please

## 2016-08-26 NOTE — Telephone Encounter (Signed)
Pt is scheduled for Wednesday 08/28/2016

## 2016-08-27 ENCOUNTER — Other Ambulatory Visit: Payer: Self-pay | Admitting: Pain Medicine

## 2016-08-27 ENCOUNTER — Other Ambulatory Visit: Payer: Self-pay

## 2016-08-27 DIAGNOSIS — E559 Vitamin D deficiency, unspecified: Secondary | ICD-10-CM

## 2016-08-27 MED ORDER — VITAMIN D3 50 MCG (2000 UT) PO CAPS: ORAL_CAPSULE | ORAL | 99 refills | Status: DC

## 2016-08-27 MED ORDER — VITAMIN D (ERGOCALCIFEROL) 1.25 MG (50000 UNIT) PO CAPS: ORAL_CAPSULE | ORAL | 0 refills | Status: DC

## 2016-08-27 NOTE — Progress Notes (Signed)
Reason for ordering the test: To determine the cause of the diffuse arthralgias & myalgias. Finding(s): Low Vitamin D levels Explanation of findings:  Low Vitamin D Results: Normal levels: between 30 and 100 ng/mL. Vitamin D Insufficiency: Levels between 20-30 ng/ml are defined as a "Vitamin D insufficiency". Vitamin D Deficiency: Levels below 20 ng/ml, is diagnosed as a "Vitamin D Deficiency". Common causes include: dietary insufficiency; inadequate sun exposure; inability to absorb vitamin D from the intestines; or inability to process it due to kidney or liver disease. Low 25-hydroxyvitamin D: A low blood level of 25-hydroxyvitamin D may mean that a person is not getting enough exposure to sunlight or enough dietary vitamin D to meet his or her body's demand or that there is a problem with its absorption from the intestines. Occasionally, drugs used to treat seizures, particularly phenytoin (Dilantin), can interfere with the production of 25-hydroxyvitamin D in the liver. There is some evidence that vitamin D deficiency may increase the risk of some cancers, immune diseases, and cardiovascular disease. Low 1,25-dihydroxyvitamin D: A low level of 1,25-dihydroxyvitamin D can be seen in kidney disease and is one of the earliest changes to occur in persons with early kidney failure. Associated complications may include: hypocalcemia, hypophosphatemia, and reduced bone density. Associated symptoms: Vitamin D deficiencies and insufficiencies may be associated with fatigue, weakness, bone pain, joint pain, and muscle pain. Recommendation(s): Patient may benefit from taking over-the-counter Vitamin D3 supplements. I recommend a vitamin D + Calcium supplements. "Natures Bounty", a brand easily found in most pharmacies, has a formulation containing Calcium 1200 mg plus Vitamin D3 1000 IU, in Softgels capsules that are easy to swallow. This should be taken once a day, preferably in the morning as vitamin D will  increase energy levels and make it difficult to fall asleep, if taken at night. Patients with levels lower than 20 ng/ml should contact their primary care physicians to receive replacement therapy. Vitamin D3 can be obtained over-the-counter, without a prescription. Vitamin D2 requires a prescription and it is used for replacement therapy. 

## 2016-08-27 NOTE — Progress Notes (Signed)
Normal CRP (C-reactive protein) Level(s): Less than <1.0 mg/dL. Elevated level(s): Levels above 1.0 mg/dL. Clinical significance: C-reactive protein (CRP) is produced by the liver. The level of CRP rises when there is inflammation throughout the body. CRP goes up in response to inflammation. High levels suggests the presence of chronic inflammation but do not identify its location or cause. Drops of previously elevated levels suggest that the inflammation or infection is subsiding and/or responding to treatment. Possible causes: High levels have been observed in obese patients, individuals with bacterial infections, chronic inflammation, or flare-ups of inflammatory conditions. Recommendations: - Consider the use of anti-inflammatory diet and medications. - The combined elevation of the ESR & CRP, may be suggestive of an autoimmune disease. Patients with elevated levels of both should consider an evaluation by a Rheumatologist. 

## 2016-08-27 NOTE — Progress Notes (Signed)
-   Normal fasting (NPO x 8 hours) glucose levels are between 65-99 mg/dl, with 2 hour fasting, levels are usually less than 140 mg/dl. Any random blood glucose level greater than 200 mg/dl is considered to be Diabetes. Reason for ordering the test: To assess Liver function. It is used to find out how well your liver is working. It is often given as part of a panel of tests that measure liver function. Finding(s): Low Total Bilirubin Explanation of findings:  Normal levels of Total Bilirubin: between 0.3 - 1.2 mg/dL Normal levels of Direct Bilirubin: between 0.1-0.4 mg/dL This test measures the amount of a substance called bilirubin. A small amount of bilirubin in your blood is normal, but a high level may be a sign of liver disease. The liver makes bile to help you digest food, and bile contains bilirubin. Most bilirubin comes from the body's normal process of breaking down old red blood cells. A normal healthy liver can get rid of bilirubin. When you have liver problems, it can build up in your body to unhealthy levels. Low levels of bilirubin: levels below 0.3 mg/dL are generally not concerning and are not monitored. Patient Recommendation(s): No further action required.

## 2016-08-27 NOTE — Progress Notes (Signed)
Results were reviewed and found to be: abnormal  Surgical consultation may be of benefit  Review would suggest the patient to be a possible candidate for interventional pain management options

## 2016-08-28 ENCOUNTER — Telehealth: Payer: Self-pay | Admitting: Family Medicine

## 2016-08-28 ENCOUNTER — Ambulatory Visit (INDEPENDENT_AMBULATORY_CARE_PROVIDER_SITE_OTHER): Payer: Medicaid Other | Admitting: Family Medicine

## 2016-08-28 ENCOUNTER — Encounter: Payer: Self-pay | Admitting: Family Medicine

## 2016-08-28 VITALS — BP 122/72 | HR 81 | Temp 98.5°F | Resp 14 | Wt 203.0 lb

## 2016-08-28 DIAGNOSIS — Z79899 Other long term (current) drug therapy: Secondary | ICD-10-CM | POA: Diagnosis not present

## 2016-08-28 DIAGNOSIS — J0101 Acute recurrent maxillary sinusitis: Secondary | ICD-10-CM

## 2016-08-28 DIAGNOSIS — M797 Fibromyalgia: Secondary | ICD-10-CM | POA: Diagnosis not present

## 2016-08-28 DIAGNOSIS — E538 Deficiency of other specified B group vitamins: Secondary | ICD-10-CM | POA: Diagnosis not present

## 2016-08-28 DIAGNOSIS — M722 Plantar fascial fibromatosis: Secondary | ICD-10-CM | POA: Diagnosis not present

## 2016-08-28 DIAGNOSIS — E782 Mixed hyperlipidemia: Secondary | ICD-10-CM | POA: Diagnosis not present

## 2016-08-28 DIAGNOSIS — E785 Hyperlipidemia, unspecified: Secondary | ICD-10-CM | POA: Insufficient documentation

## 2016-08-28 DIAGNOSIS — Z23 Encounter for immunization: Secondary | ICD-10-CM

## 2016-08-28 DIAGNOSIS — N200 Calculus of kidney: Secondary | ICD-10-CM

## 2016-08-28 LAB — LIPID PANEL
CHOLESTEROL: 219 mg/dL — AB (ref <200)
HDL: 37 mg/dL — ABNORMAL LOW (ref >50)
LDL Cholesterol: 132 mg/dL — ABNORMAL HIGH (ref <100)
TRIGLYCERIDES: 252 mg/dL — AB (ref <150)
Total CHOL/HDL Ratio: 5.9 Ratio — ABNORMAL HIGH (ref <5.0)
VLDL: 50 mg/dL — ABNORMAL HIGH (ref <30)

## 2016-08-28 MED ORDER — PREGABALIN 200 MG PO CAPS: 200.0000 mg | ORAL_CAPSULE | Freq: Two times a day (BID) | ORAL | 2 refills | Status: DC

## 2016-08-28 MED ORDER — DOXYCYCLINE HYCLATE 100 MG PO TABS: 100.0000 mg | ORAL_TABLET | Freq: Two times a day (BID) | ORAL | 0 refills | Status: DC

## 2016-08-28 NOTE — Assessment & Plan Note (Signed)
Allergic to amoxicillin; will treat with doxycycline; nasal spray rinses

## 2016-08-28 NOTE — Assessment & Plan Note (Signed)
[place] web site reviewed; no other prescribers; no early fills; consistent UDS on the chart

## 2016-08-28 NOTE — Progress Notes (Signed)
BP 122/72   Pulse 81   Temp 98.5 F (36.9 C) (Oral)   Resp 14   Wt 203 lb (92.1 kg)   LMP 07/27/2016   SpO2 93%   BMI 35.96 kg/m    Subjective:    Patient ID: Dana Bradley, female    DOB: 03/12/1971, 45 y.o.   MRN: [id number]  HPI: Dana Bradley is a 45 y.o. female  Chief Complaint  Patient presents with  . Follow-up    nueurologist Manuella Ghazi) memory,vision and balance issues  . URI    sinus infection  . Plantar Fasciitis    referral [facility]  . Labs Only   Lyrica refills are needed; signed contract from July on the chart; last UDS consistent; she takes the Lyrica for fibromyalgia, is in constant pain; deep-seated pain, feels like she is on fire, like in a car accident, fallen down a set of stairs; if she doesn't take the lyrica, she will sleep 22 hours a day and hurts so bad she will vomit from pain; the medicine does not make her feel goofy or loopy or drunk; it decreases the pain and tingling; doesn't cure it, but does make it bearable  Sinus infection for 4 weeks; greenish blood-tinged sputum; pressure on the left side mostly; feels it when blowing nose, feels it coming out of the sinus; left ear a little muffled sometimes; tries to avoid antibiotics; cannot take amoxicillin  She has plantar fasciitis, would like to see Dr. Ammie Ferrier PA, Carlynn Spry, would like injection; doing exercises; just the left  She is having memory issues, wonders if she has a low vitamin level; trouble with memory, finding words; already talked to neurologist; would like labs done; last B12 was only 183  High cholesterol; needs labs  Depression screen [facility] 2/9 08/28/2016 08/07/2016 04/29/2016 08/30/2015 05/15/2015  Decreased Interest 0 0 2 0 0  Down, Depressed, Hopeless 1 0 3 0 0  PHQ - 2 Score 1 0 5 0 0  Altered sleeping - - 3 - -  Tired, decreased energy - - 3 - -  Change in appetite - - 3 - -  Feeling bad or failure about yourself  - - 3 - -  Trouble concentrating - - 2 - -  Moving slowly  or fidgety/restless - - 0 - -  Suicidal thoughts - - 1 - -  PHQ-9 Score - - 20 - -  Difficult doing work/chores - - Somewhat difficult - -   Relevant past medical, surgical, family and social history reviewed Past Medical History:  Diagnosis Date  . Bursitis   . Edema leg 05/02/2015  . Fibromyalgia   . GERD (gastroesophageal reflux disease)   . IBS (irritable bowel syndrome)   . Knee pain, bilateral 12/21/2008   Qualifier: Diagnosis of  By: Hassell Done FNP, Tori Milks    . Lumbar discitis   . Osteoarthritis   . Right hand pain 04/10/2015   [facility] has done nerve conduction studies and ruled out carpal tunnel.   . Sleep apnea   . SVT (supraventricular tachycardia) ([place])   . Vertigo   . Vitamin D deficiency 05/01/2016   Past Surgical History:  Procedure Laterality Date  . ABLATION     Uterine  . CARDIAC CATHETERIZATION    . COLONOSCOPY WITH PROPOFOL N/A 05/17/2015   Procedure: COLONOSCOPY WITH PROPOFOL;  Surgeon: Manya Silvas, MD;  Location: [facility] ENDOSCOPY;  Service: Endoscopy;  Laterality: N/A;  . ESOPHAGOGASTRODUODENOSCOPY N/A 05/17/2015   Procedure:  ESOPHAGOGASTRODUODENOSCOPY (EGD);  Surgeon: Manya Silvas, MD;  Location[facility] ENDOSCOPY;  Service: Endoscopy;  Laterality: N/A;  . KNEE ARTHROSCOPY    . TUBAL LIGATION  10/01/99   Family History  Problem Relation Age of Onset  . Depression Mother   . Hypertension Mother   . Cancer Mother     Skin  . Hyperlipidemia Mother   . Alcohol abuse Father   . Depression Father   . Stroke Father   . Heart disease Father   . Hypertension Father   . Depression Sister   . Hyperlipidemia Sister   . Diabetes Sister   . Cancer Maternal Grandmother 46    Breast  . Polycystic ovary syndrome Sister   . Alzheimer's disease    . Bladder Cancer Neg Hx   . Kidney cancer Neg Hx    Social History  Substance Use Topics  . Smoking status: Former Smoker    Packs/day: 4.00    Years: 3.00    Types: Cigarettes    Quit date: 12/10/1992    . Smokeless tobacco: Never Used  . Alcohol use No    Interim medical history since last visit reviewed. Allergies and medications reviewed  Review of Systems Per HPI unless specifically indicated above     Objective:    BP 122/72   Pulse 81   Temp 98.5 F (36.9 C) (Oral)   Resp 14   Wt 203 lb (92.1 kg)   LMP 07/27/2016   SpO2 93%   BMI 35.96 kg/m   Wt Readings from Last 3 Encounters:  08/28/16 203 lb (92.1 kg)  08/19/16 208 lb 11.2 oz (94.7 kg)  08/07/16 215 lb (97.5 kg)    Physical Exam  Constitutional: She appears well-developed and well-nourished. No distress.  HENT:  Head: Normocephalic and atraumatic.  Right Ear: Hearing, tympanic membrane, external ear and ear canal normal.  Left Ear: Hearing, tympanic membrane, external ear and ear canal normal.  Nose: Mucosal edema and rhinorrhea present. Right sinus exhibits maxillary sinus tenderness. Left sinus exhibits maxillary sinus tenderness.  Mouth/Throat: Oropharynx is clear and moist and mucous membranes are normal.  Eyes: EOM are normal. No scleral icterus.  Neck: No thyromegaly present.  Cardiovascular: Normal rate, regular rhythm and normal heart sounds.   No murmur heard. Pulmonary/Chest: Effort normal and breath sounds normal. No respiratory distress. She has no wheezes.  Abdominal: Soft. Bowel sounds are normal. She exhibits no distension.  Musculoskeletal: Normal range of motion. She exhibits no edema.  Neurological: She is alert. She exhibits normal muscle tone.  Skin: Skin is warm and dry. She is not diaphoretic. No pallor.  Psychiatric: She has a normal mood and affect. Her behavior is normal. Judgment and thought content normal. Her mood appears not anxious. She does not exhibit a depressed mood.      Assessment & Plan:   Problem List Items Addressed This Visit      Respiratory   Acute recurrent maxillary sinusitis    Allergic to amoxicillin; will treat with doxycycline; nasal spray rinses       Relevant Medications   doxycycline (VIBRA-TABS) 100 MG tablet     Musculoskeletal and Integument   Plantar fasciitis of left foot    Refer to PA Carlynn Spry, she would like consideration for injection since going on 4+ weeks      Relevant Orders   Ambulatory referral to Orthopedic Surgery     Other   Vitamin B12 deficiency    Start sublingual vitamin  B12, 500 or 1000 mcg daily      Hyperlipidemia    Check fasting labs      Relevant Orders   Lipid panel (Completed)   Fibromyalgia (Chronic)   Relevant Medications   pregabalin (LYRICA) 200 MG capsule   Controlled substance agreement signed    [place] web site reviewed; no other prescribers; no early fills; consistent UDS on the chart       Other Visit Diagnoses    Fibromyalgia affecting multiple sites    -  Primary   Relevant Medications   pregabalin (LYRICA) 200 MG capsule   Needs flu shot           Follow up plan: Return in about 3 months (around 11/27/2016) for follow-up.  An after-visit summary was printed and given to the patient at [facility].  Please see the patient instructions which may contain other information and recommendations beyond what is mentioned above in the assessment and plan.  Meds ordered this encounter  Medications  . pregabalin (LYRICA) 200 MG capsule    Sig: Take 1 capsule (200 mg total) by mouth 2 (two) times daily.    Dispense:  60 capsule    Refill:  2    [facility]  . doxycycline (VIBRA-TABS) 100 MG tablet    Sig: Take 1 tablet (100 mg total) by mouth 2 (two) times daily.    Dispense:  20 tablet    Refill:  0    Orders Placed This Encounter  Procedures  . Lipid panel  . Ambulatory referral to Orthopedic Surgery

## 2016-08-28 NOTE — Patient Instructions (Addendum)
Please do eat yogurt daily or take a probiotic daily for the next month or two We want to replace the healthy germs in the gut If you notice foul, watery diarrhea in the next two months, schedule an appointment RIGHT AWAY I've put in a referral to Carlynn Spry, PA for your plantar fasciitis Do please get back in with the pain clinic I've refilled the lyrica for 3 months; if you need a refill from me, that will require an in-person visit We'll get labs today

## 2016-08-28 NOTE — Assessment & Plan Note (Signed)
Check fasting labs 

## 2016-08-28 NOTE — Assessment & Plan Note (Signed)
Refer to PA Carlynn Spry, she would like consideration for injection since going on 4+ weeks

## 2016-08-28 NOTE — Telephone Encounter (Signed)
Note received from pain clinic doctor Patient has large kidney stone, noted on xray I've put in referral to urologist Have her hydrate, avoid dark colas, iced tea

## 2016-08-28 NOTE — Assessment & Plan Note (Signed)
Start sublingual vitamin B12, 500 or 1000 mcg daily

## 2016-08-28 NOTE — Assessment & Plan Note (Signed)
Refer to urologist; noted on xrays from pain clinic

## 2016-08-29 ENCOUNTER — Telehealth: Payer: Self-pay

## 2016-08-29 DIAGNOSIS — Z5181 Encounter for therapeutic drug level monitoring: Secondary | ICD-10-CM

## 2016-08-29 DIAGNOSIS — E782 Mixed hyperlipidemia: Secondary | ICD-10-CM

## 2016-08-29 MED ORDER — ATORVASTATIN CALCIUM 10 MG PO TABS: 10.0000 mg | ORAL_TABLET | Freq: Every day | ORAL | 1 refills | Status: DC

## 2016-08-29 NOTE — Assessment & Plan Note (Signed)
Check sgpt in 6-8 weeks 

## 2016-08-29 NOTE — Telephone Encounter (Signed)
Thank you Let's start low dose atorvastatin every OTHER night and have her recheck labs in 8 weeks (around January 25th) I entered orders Call with any issues; thanks

## 2016-08-29 NOTE — Assessment & Plan Note (Signed)
Start low dose statin; recheck lipids and sgpt in 6-8 weeks

## 2016-08-29 NOTE — Telephone Encounter (Signed)
Called patient and she mention that the pain clinic talk to her about a urologist therefore she has already seen an urologist;Dr. Larene Beach. They stated to her since its not causing her any pain or no blockage its not a big concern but they did give her instructions on what to and not to drink.

## 2016-08-29 NOTE — Telephone Encounter (Signed)
Notified patient about lab results. Patient was asking question about her results therefore went over the numbers with the patient and explain what those numbers mean to her and her diet. Patient states she has cut out a lot of the fast food eating, barely to no meat and she has incorporate more leafy veggies in her diet. Ask patient was she truly fasting when her blood was drawn, pt said yes. Also mention Pt about being on medication, Pt stated that she was on a statin before she don't remember which one but it made her feel weird and fatigue. She is willing to get on medication but she does not want to be on  Anything that will make her feel wired.

## 2016-08-30 NOTE — Telephone Encounter (Signed)
Called patient and explain to her the medication you Prescribed her; dosage and your instructions on how to take the med. Mention you would like for her to come in for labs in 8 weeks. Patient agreed.

## 2016-09-09 ENCOUNTER — Ambulatory Visit: Payer: Medicaid Other | Attending: Pain Medicine | Admitting: Pain Medicine

## 2016-09-09 ENCOUNTER — Encounter: Payer: Self-pay | Admitting: Pain Medicine

## 2016-09-09 VITALS — BP 125/67 | HR 94 | Temp 98.2°F | Resp 16 | Ht 63.0 in | Wt 203.0 lb

## 2016-09-09 DIAGNOSIS — Z818 Family history of other mental and behavioral disorders: Secondary | ICD-10-CM | POA: Diagnosis not present

## 2016-09-09 DIAGNOSIS — Z87891 Personal history of nicotine dependence: Secondary | ICD-10-CM | POA: Insufficient documentation

## 2016-09-09 DIAGNOSIS — Z79891 Long term (current) use of opiate analgesic: Secondary | ICD-10-CM | POA: Diagnosis not present

## 2016-09-09 DIAGNOSIS — Z881 Allergy status to other antibiotic agents status: Secondary | ICD-10-CM | POA: Insufficient documentation

## 2016-09-09 DIAGNOSIS — M25561 Pain in right knee: Secondary | ICD-10-CM | POA: Insufficient documentation

## 2016-09-09 DIAGNOSIS — F3340 Major depressive disorder, recurrent, in remission, unspecified: Secondary | ICD-10-CM | POA: Insufficient documentation

## 2016-09-09 DIAGNOSIS — M25562 Pain in left knee: Secondary | ICD-10-CM | POA: Insufficient documentation

## 2016-09-09 DIAGNOSIS — M4722 Other spondylosis with radiculopathy, cervical region: Secondary | ICD-10-CM | POA: Diagnosis not present

## 2016-09-09 DIAGNOSIS — M5137 Other intervertebral disc degeneration, lumbosacral region: Secondary | ICD-10-CM | POA: Insufficient documentation

## 2016-09-09 DIAGNOSIS — M5481 Occipital neuralgia: Secondary | ICD-10-CM | POA: Insufficient documentation

## 2016-09-09 DIAGNOSIS — Z6835 Body mass index (BMI) 35.0-35.9, adult: Secondary | ICD-10-CM | POA: Insufficient documentation

## 2016-09-09 DIAGNOSIS — Z885 Allergy status to narcotic agent status: Secondary | ICD-10-CM | POA: Insufficient documentation

## 2016-09-09 DIAGNOSIS — Z823 Family history of stroke: Secondary | ICD-10-CM | POA: Insufficient documentation

## 2016-09-09 DIAGNOSIS — Z882 Allergy status to sulfonamides status: Secondary | ICD-10-CM | POA: Diagnosis not present

## 2016-09-09 DIAGNOSIS — E559 Vitamin D deficiency, unspecified: Secondary | ICD-10-CM | POA: Diagnosis not present

## 2016-09-09 DIAGNOSIS — Z886 Allergy status to analgesic agent status: Secondary | ICD-10-CM | POA: Diagnosis not present

## 2016-09-09 DIAGNOSIS — M5442 Lumbago with sciatica, left side: Secondary | ICD-10-CM

## 2016-09-09 DIAGNOSIS — R1031 Right lower quadrant pain: Secondary | ICD-10-CM | POA: Insufficient documentation

## 2016-09-09 DIAGNOSIS — K219 Gastro-esophageal reflux disease without esophagitis: Secondary | ICD-10-CM | POA: Insufficient documentation

## 2016-09-09 DIAGNOSIS — K589 Irritable bowel syndrome without diarrhea: Secondary | ICD-10-CM | POA: Insufficient documentation

## 2016-09-09 DIAGNOSIS — G4486 Cervicogenic headache: Secondary | ICD-10-CM | POA: Insufficient documentation

## 2016-09-09 DIAGNOSIS — M5414 Radiculopathy, thoracic region: Secondary | ICD-10-CM | POA: Insufficient documentation

## 2016-09-09 DIAGNOSIS — M25571 Pain in right ankle and joints of right foot: Secondary | ICD-10-CM | POA: Insufficient documentation

## 2016-09-09 DIAGNOSIS — M62838 Other muscle spasm: Secondary | ICD-10-CM | POA: Insufficient documentation

## 2016-09-09 DIAGNOSIS — E669 Obesity, unspecified: Secondary | ICD-10-CM | POA: Insufficient documentation

## 2016-09-09 DIAGNOSIS — M722 Plantar fascial fibromatosis: Secondary | ICD-10-CM | POA: Insufficient documentation

## 2016-09-09 DIAGNOSIS — Z79899 Other long term (current) drug therapy: Secondary | ICD-10-CM | POA: Diagnosis not present

## 2016-09-09 DIAGNOSIS — M4712 Other spondylosis with myelopathy, cervical region: Secondary | ICD-10-CM | POA: Diagnosis not present

## 2016-09-09 DIAGNOSIS — G894 Chronic pain syndrome: Secondary | ICD-10-CM

## 2016-09-09 DIAGNOSIS — Z5181 Encounter for therapeutic drug level monitoring: Secondary | ICD-10-CM | POA: Insufficient documentation

## 2016-09-09 DIAGNOSIS — M797 Fibromyalgia: Secondary | ICD-10-CM | POA: Insufficient documentation

## 2016-09-09 DIAGNOSIS — G8929 Other chronic pain: Secondary | ICD-10-CM | POA: Diagnosis present

## 2016-09-09 DIAGNOSIS — G4733 Obstructive sleep apnea (adult) (pediatric): Secondary | ICD-10-CM | POA: Insufficient documentation

## 2016-09-09 DIAGNOSIS — I10 Essential (primary) hypertension: Secondary | ICD-10-CM | POA: Insufficient documentation

## 2016-09-09 DIAGNOSIS — G44211 Episodic tension-type headache, intractable: Secondary | ICD-10-CM

## 2016-09-09 DIAGNOSIS — M5412 Radiculopathy, cervical region: Secondary | ICD-10-CM

## 2016-09-09 DIAGNOSIS — Z833 Family history of diabetes mellitus: Secondary | ICD-10-CM | POA: Insufficient documentation

## 2016-09-09 DIAGNOSIS — M25572 Pain in left ankle and joints of left foot: Secondary | ICD-10-CM | POA: Insufficient documentation

## 2016-09-09 DIAGNOSIS — Z880 Allergy status to penicillin: Secondary | ICD-10-CM | POA: Insufficient documentation

## 2016-09-09 DIAGNOSIS — Z811 Family history of alcohol abuse and dependence: Secondary | ICD-10-CM | POA: Diagnosis not present

## 2016-09-09 DIAGNOSIS — M542 Cervicalgia: Secondary | ICD-10-CM

## 2016-09-09 DIAGNOSIS — E785 Hyperlipidemia, unspecified: Secondary | ICD-10-CM | POA: Insufficient documentation

## 2016-09-09 DIAGNOSIS — E538 Deficiency of other specified B group vitamins: Secondary | ICD-10-CM | POA: Insufficient documentation

## 2016-09-09 DIAGNOSIS — R51 Headache: Secondary | ICD-10-CM

## 2016-09-09 DIAGNOSIS — Z8249 Family history of ischemic heart disease and other diseases of the circulatory system: Secondary | ICD-10-CM | POA: Diagnosis not present

## 2016-09-09 DIAGNOSIS — M549 Dorsalgia, unspecified: Secondary | ICD-10-CM

## 2016-09-09 DIAGNOSIS — M5441 Lumbago with sciatica, right side: Secondary | ICD-10-CM

## 2016-09-09 DIAGNOSIS — Z888 Allergy status to other drugs, medicaments and biological substances status: Secondary | ICD-10-CM | POA: Diagnosis not present

## 2016-09-09 DIAGNOSIS — F411 Generalized anxiety disorder: Secondary | ICD-10-CM | POA: Insufficient documentation

## 2016-09-09 MED ORDER — NONFORMULARY OR COMPOUNDED ITEM: 1.0000 | 0 refills | Status: DC | PRN

## 2016-09-09 MED ORDER — TIZANIDINE HCL 2 MG PO TABS: 2.0000 mg | ORAL_TABLET | Freq: Four times a day (QID) | ORAL | 2 refills | Status: DC | PRN

## 2016-09-09 MED ORDER — PREGABALIN 150 MG PO CAPS: 150.0000 mg | ORAL_CAPSULE | Freq: Three times a day (TID) | ORAL | 2 refills | Status: DC

## 2016-09-09 MED ORDER — HYDROCODONE-ACETAMINOPHEN 7.5-325 MG PO TABS: 1.0000 | ORAL_TABLET | Freq: Four times a day (QID) | ORAL | 0 refills | Status: DC | PRN

## 2016-09-09 NOTE — Patient Instructions (Signed)
GENERAL RISKS AND COMPLICATIONS  What are the risk, side effects and possible complications? Generally speaking, most procedures are safe.  However, with any procedure there are risks, side effects, and the possibility of complications.  The risks and complications are dependent upon the sites that are lesioned, or the type of nerve block to be performed.  The closer the procedure is to the spine, the more serious the risks are.  Great care is taken when placing the radio frequency needles, block needles or lesioning probes, but sometimes complications can occur. 1. Infection: Any time there is an injection through the skin, there is a risk of infection.  This is why sterile conditions are used for these blocks.  There are four possible types of infection. 1. Localized skin infection. 2. Central Nervous System Infection-This can be in the form of Meningitis, which can be deadly. 3. Epidural Infections-This can be in the form of an epidural abscess, which can cause pressure inside of the spine, causing compression of the spinal cord with subsequent paralysis. This would require an emergency surgery to decompress, and there are no guarantees that the patient would recover from the paralysis. 4. Discitis-This is an infection of the intervertebral discs.  It occurs in about 1% of discography procedures.  It is difficult to treat and it may lead to surgery.        2. Pain: the needles have to go through skin and soft tissues, will cause soreness.       3. Damage to internal structures:  The nerves to be lesioned may be near blood vessels or    other nerves which can be potentially damaged.       4. Bleeding: Bleeding is more common if the patient is taking blood thinners such as  aspirin, Coumadin, Ticiid, Plavix, etc., or if he/she have some genetic predisposition  such as hemophilia. Bleeding into the spinal canal can cause compression of the spinal  cord with subsequent paralysis.  This would require an  emergency surgery to  decompress and there are no guarantees that the patient would recover from the  paralysis.       5. Pneumothorax:  Puncturing of a lung is a possibility, every time a needle is introduced in  the area of the chest or upper back.  Pneumothorax refers to free air around the  collapsed lung(s), inside of the thoracic cavity (chest cavity).  Another two possible  complications related to a similar event would include: Hemothorax and Chylothorax.   These are variations of the Pneumothorax, where instead of air around the collapsed  lung(s), you may have blood or chyle, respectively.       6. Spinal headaches: They may occur with any procedures in the area of the spine.       7. Persistent CSF (Cerebro-Spinal Fluid) leakage: This is a rare problem, but may occur  with prolonged intrathecal or epidural catheters either due to the formation of a fistulous  track or a dural tear.       8. Nerve damage: By working so close to the spinal cord, there is always a possibility of  nerve damage, which could be as serious as a permanent spinal cord injury with  paralysis.       9. Death:  Although rare, severe deadly allergic reactions known as "Anaphylactic  reaction" can occur to any of the medications used.      10. Worsening of the symptoms:  We can always make thing worse.    What are the chances of something like this happening? Chances of any of this occuring are extremely low.  By statistics, you have more of a chance of getting killed in a motor vehicle accident: while driving to the hospital than any of the above occurring .  Nevertheless, you should be aware that they are possibilities.  In general, it is similar to taking a shower.  Everybody knows that you can slip, hit your head and get killed.  Does that mean that you should not shower again?  Nevertheless always keep in mind that statistics do not mean anything if you happen to be on the wrong side of them.  Even if a procedure has a 1  (one) in a 1,000,000 (million) chance of going wrong, it you happen to be that one..Also, keep in mind that by statistics, you have more of a chance of having something go wrong when taking medications.  Who should not have this procedure? If you are on a blood thinning medication (e.g. Coumadin, Plavix, see list of "Blood Thinners"), or if you have an active infection going on, you should not have the procedure.  If you are taking any blood thinners, please inform your physician.  How should I prepare for this procedure?  Do not eat or drink anything at least six hours prior to the procedure.  Bring a driver with you .  It cannot be a taxi.  Come accompanied by an adult that can drive you back, and that is strong enough to help you if your legs get weak or numb from the local anesthetic.  Take all of your medicines the morning of the procedure with just enough water to swallow them.  If you have diabetes, make sure that you are scheduled to have your procedure done first thing in the morning, whenever possible.  If you have diabetes, take only half of your insulin dose and notify our nurse that you have done so as soon as you arrive at the clinic.  If you are diabetic, but only take blood sugar pills (oral hypoglycemic), then do not take them on the morning of your procedure.  You may take them after you have had the procedure.  Do not take aspirin or any aspirin-containing medications, at least eleven (11) days prior to the procedure.  They may prolong bleeding.  Wear loose fitting clothing that may be easy to take off and that you would not mind if it got stained with Betadine or blood.  Do not wear any jewelry or perfume  Remove any nail coloring.  It will interfere with some of our monitoring equipment.  NOTE: Remember that this is not meant to be interpreted as a complete list of all possible complications.  Unforeseen problems may occur.  BLOOD THINNERS The following drugs  contain aspirin or other products, which can cause increased bleeding during surgery and should not be taken for 2 weeks prior to and 1 week after surgery.  If you should need take something for relief of minor pain, you may take acetaminophen which is found in Tylenol,m Datril, Anacin-3 and Panadol. It is not blood thinner. The products listed below are.  Do not take any of the products listed below in addition to any listed on your instruction sheet.  A.P.C or A.P.C with Codeine Codeine Phosphate Capsules #3 Ibuprofen Ridaura  ABC compound Congesprin Imuran rimadil  Advil Cope Indocin Robaxisal  Alka-Seltzer Effervescent Pain Reliever and Antacid Coricidin or Coricidin-D  Indomethacin Rufen    Alka-Seltzer plus Cold Medicine Cosprin Ketoprofen S-A-C Tablets  Anacin Analgesic Tablets or Capsules Coumadin Korlgesic Salflex  Anacin Extra Strength Analgesic tablets or capsules CP-2 Tablets Lanoril Salicylate  Anaprox Cuprimine Capsules Levenox Salocol  Anexsia-D Dalteparin Magan Salsalate  Anodynos Darvon compound Magnesium Salicylate Sine-off  Ansaid Dasin Capsules Magsal Sodium Salicylate  Anturane Depen Capsules Marnal Soma  APF Arthritis pain formula Dewitt's Pills Measurin Stanback  Argesic Dia-Gesic Meclofenamic Sulfinpyrazone  Arthritis Bayer Timed Release Aspirin Diclofenac Meclomen Sulindac  Arthritis pain formula Anacin Dicumarol Medipren Supac  Analgesic (Safety coated) Arthralgen Diffunasal Mefanamic Suprofen  Arthritis Strength Bufferin Dihydrocodeine Mepro Compound Suprol  Arthropan liquid Dopirydamole Methcarbomol with Aspirin Synalgos  ASA tablets/Enseals Disalcid Micrainin Tagament  Ascriptin Doan's Midol Talwin  Ascriptin A/D Dolene Mobidin Tanderil  Ascriptin Extra Strength Dolobid Moblgesic Ticlid  Ascriptin with Codeine Doloprin or Doloprin with Codeine Momentum Tolectin  Asperbuf Duoprin Mono-gesic Trendar  Aspergum Duradyne Motrin or Motrin IB Triminicin  Aspirin  plain, buffered or enteric coated Durasal Myochrisine Trigesic  Aspirin Suppositories Easprin Nalfon Trillsate  Aspirin with Codeine Ecotrin Regular or Extra Strength Naprosyn Uracel  Atromid-S Efficin Naproxen Ursinus  Auranofin Capsules Elmiron Neocylate Vanquish  Axotal Emagrin Norgesic Verin  Azathioprine Empirin or Empirin with Codeine Normiflo Vitamin E  Azolid Emprazil Nuprin Voltaren  Bayer Aspirin plain, buffered or children's or timed BC Tablets or powders Encaprin Orgaran Warfarin Sodium  Buff-a-Comp Enoxaparin Orudis Zorpin  Buff-a-Comp with Codeine Equegesic Os-Cal-Gesic   Buffaprin Excedrin plain, buffered or Extra Strength Oxalid   Bufferin Arthritis Strength Feldene Oxphenbutazone   Bufferin plain or Extra Strength Feldene Capsules Oxycodone with Aspirin   Bufferin with Codeine Fenoprofen Fenoprofen Pabalate or Pabalate-SF   Buffets II Flogesic Panagesic   Buffinol plain or Extra Strength Florinal or Florinal with Codeine Panwarfarin   Buf-Tabs Flurbiprofen Penicillamine   Butalbital Compound Four-way cold tablets Penicillin   Butazolidin Fragmin Pepto-Bismol   Carbenicillin Geminisyn Percodan   Carna Arthritis Reliever Geopen Persantine   Carprofen Gold's salt Persistin   Chloramphenicol Goody's Phenylbutazone   Chloromycetin Haltrain Piroxlcam   Clmetidine heparin Plaquenil   Cllnoril Hyco-pap Ponstel   Clofibrate Hydroxy chloroquine Propoxyphen         Before stopping any of these medications, be sure to consult the physician who ordered them.  Some, such as Coumadin (Warfarin) are ordered to prevent or treat serious conditions such as "deep thrombosis", "pumonary embolisms", and other heart problems.  The amount of time that you may need off of the medication may also vary with the medication and the reason for which you were taking it.  If you are taking any of these medications, please make sure you notify your pain physician before you undergo any  procedures.         Epidural Steroid Injection An epidural steroid injection is a shot of steroid medicine and numbing medicine that is given into the space between the spinal cord and the bones in your back (epidural space). The shot helps relieve pain caused by an irritated or swollen nerve root. The amount of pain relief you get from the injection depends on what is causing the nerve to be swollen and irritated, and how long your pain lasts. You are more likely to benefit from this injection if your pain is strong and comes on suddenly rather than if you have had pain for a long time. Tell a health care provider about:  Any allergies you have.  All medicines you are taking, including vitamins, herbs,   eye drops, creams, and over-the-counter medicines.  Any problems you or family members have had with anesthetic medicines.  Any blood disorders you have.  Any surgeries you have had.  Any medical conditions you have.  Whether you are pregnant or may be pregnant. What are the risks? Generally, this is a safe procedure. However, problems may occur, including:  Headache.  Bleeding.  Infection.  Allergic reaction to medicines.  Damage to your nerves.  What happens before the procedure? Staying hydrated Follow instructions from your health care provider about hydration, which may include:  Up to 2 hours before the procedure - you may continue to drink clear liquids, such as water, clear fruit juice, black coffee, and plain tea.  Eating and drinking restrictions Follow instructions from your health care provider about eating and drinking, which may include:  8 hours before the procedure - stop eating heavy meals or foods such as meat, fried foods, or fatty foods.  6 hours before the procedure - stop eating light meals or foods, such as toast or cereal.  6 hours before the procedure - stop drinking milk or drinks that contain milk.  2 hours before the procedure - stop  drinking clear liquids.  Medicine  You may be given medicines to lower anxiety.  Ask your health care provider about: ? Changing or stopping your regular medicines. This is especially important if you are taking diabetes medicines or blood thinners. ? Taking medicines such as aspirin and ibuprofen. These medicines can thin your blood. Do not take these medicines before your procedure if your health care provider instructs you not to. General instructions  Plan to have someone take you home from the hospital or clinic. What happens during the procedure?  You may receive a medicine to help you relax (sedative).  You will be asked to lie on your abdomen.  The injection site will be cleaned.  A numbing medicine (local anesthetic) will be used to numb the injection site.  A needle will be inserted through your skin into the epidural space. You may feel some discomfort when this happens. An X-ray machine will be used to make sure the needle is put as close as possible to the affected nerve.  A steroid medicine and a local anesthetic will be injected into the epidural space.  The needle will be removed.  A bandage (dressing) will be put over the injection site. What happens after the procedure?  Your blood pressure, heart rate, breathing rate, and blood oxygen level will be monitored until the medicines you were given have worn off.  Your arm or leg may feel weak or numb for a few hours.  The injection site may feel sore.  Do not drive for 24 hours if you received a sedative. This information is not intended to replace advice given to you by your health care provider. Make sure you discuss any questions you have with your health care provider. Document Released: 12/24/2007 Document Revised: 02/28/2016 Document Reviewed: 01/02/2016 [employer] Interactive Patient Education  2017 [employer]  

## 2016-09-09 NOTE — Progress Notes (Signed)
When Doing discharge pt states "I have to go and pick up my husband". I told patient that I needed to review her procedure for next appointment and pt states she could read. Unable to verbally give instructions. I found medication on counter for Acet with codeine #4 and took to patient. "Those need to be wasted". Instructed her and RN to watch me waste in commode and patient states "You cant waste those in water" "Do you understand that medications should not go in the water". You can hurt someone". Informed pt that this was our policy. Patient wanted meds back so she could have them wasted at her pharm. I went to ask Dr Consuela Mimes and at this time the patient yelled" Just wasted in commode, I have to go" and patient left. Medication Acet-cod #4 5 1/2 tabs wasted in  Commode by myself, DWheatley, LPatterson.

## 2016-09-09 NOTE — Progress Notes (Addendum)
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 you should be aware that they are possibilities.  In general, it is similar to taking a shower.  Everybody knows that you can slip, hit your head and get killed.  Does that mean that you should not shower again?  Nevertheless always keep in mind that statistics do not mean anything if you happen to be on the wrong side of them.  Even if a procedure has a 1 (one) in a 1,000,000 (million) chance of going wrong, it you happen to be that one..Also, keep in mind that by statistics, you have more of a chance of having something go wrong when taking medications.  Who should not have this procedure? If you are on a blood thinning medication (e.g. Coumadin, Plavix, see list of "Blood Thinners"), or if you  have an active infection going on, you should not have the procedure.  If you are taking any blood thinners, please inform your physician.  How should I prepare for this procedure?  Do not eat or drink anything at least six hours prior to the procedure.  Bring a driver with you .  It cannot be a taxi.  Come accompanied by an adult that can drive you back, and that is strong enough to help you if your legs get weak or numb from the local anesthetic.  Take all of your medicines the morning of the procedure with just enough water to swallow them.  If you have diabetes, make sure that you are scheduled to have your procedure done first thing in the morning, whenever possible.  If you have diabetes, take only half of your insulin dose and notify our nurse that you have done so as soon as you arrive at the clinic.  If you are diabetic, but only take blood sugar pills (oral hypoglycemic), then do not take them on the morning of your procedure.  You may take them after you have had the procedure.  Do not take aspirin or any aspirin-containing medications, at least  eleven (11) days prior to the procedure.  They may prolong bleeding.  Wear loose fitting clothing that may be easy to take off and that you would not mind if it got stained with Betadine or blood.  Do not wear any jewelry or perfume  Remove any nail coloring.  It will interfere with some of our monitoring equipment.  NOTE: Remember that this is not meant to be interpreted as a complete list of all possible complications.  Unforeseen problems may occur.  BLOOD THINNERS The following drugs contain aspirin or other products, which can cause increased bleeding during surgery and should not be taken for 2 weeks prior to and 1 week after surgery.  If you should need take something for relief of minor pain, you may take acetaminophen which is found in Tylenol,m Datril, Anacin-3 and Panadol. It is not blood thinner. The products listed below are.  Do not take any of the products listed below in addition to any listed on your instruction sheet.  A.P.C or A.P.C with Codeine Codeine Phosphate Capsules #3 Ibuprofen Ridaura  ABC compound Congesprin Imuran rimadil  Advil Cope Indocin Robaxisal  Alka-Seltzer Effervescent Pain Reliever and Antacid Coricidin or Coricidin-D  Indomethacin Rufen  Alka-Seltzer plus Cold Medicine Cosprin Ketoprofen S-A-C Tablets  Anacin Analgesic Tablets or Capsules Coumadin Korlgesic Salflex  Anacin Extra Strength Analgesic tablets or capsules CP-2 Tablets Lanoril Salicylate  Anaprox Cuprimine Capsules Levenox Salocol  Anexsia-D Dalteparin Magan Salsalate  Anodynos Darvon compound Magnesium Salicylate Sine-off  Ansaid Dasin Capsules Magsal Sodium Salicylate  Anturane Depen Capsules Marnal Soma  APF Arthritis pain formula Dewitt's Pills Measurin Stanback  Argesic Dia-Gesic Meclofenamic Sulfinpyrazone  Arthritis Bayer Timed Release Aspirin Diclofenac Meclomen Sulindac  Arthritis pain formula Anacin Dicumarol Medipren Supac  Analgesic (Safety coated) Arthralgen Diffunasal  Mefanamic Suprofen  Arthritis Strength Bufferin Dihydrocodeine Mepro Compound Suprol  Arthropan liquid Dopirydamole Methcarbomol with Aspirin Synalgos  ASA tablets/Enseals Disalcid Micrainin Tagament  Ascriptin Doan's Midol Talwin  Ascriptin A/D Dolene Mobidin Tanderil  Ascriptin Extra Strength Dolobid Moblgesic Ticlid  Ascriptin with Codeine Doloprin or Doloprin with Codeine Momentum Tolectin  Asperbuf Duoprin Mono-gesic Trendar  Aspergum Duradyne Motrin or Motrin IB Triminicin  Aspirin plain, buffered or enteric coated Durasal Myochrisine Trigesic  Aspirin Suppositories Easprin Nalfon Trillsate  Aspirin with Codeine  Ecotrin Regular or Extra Strength Naprosyn Uracel  Atromid-S Efficin Naproxen Ursinus  Auranofin Capsules Elmiron Neocylate Vanquish  Axotal Emagrin Norgesic Verin  Azathioprine Empirin or Empirin with Codeine Normiflo Vitamin E  Azolid Emprazil Nuprin Voltaren  Bayer Aspirin plain, buffered or children's or timed BC Tablets or powders Encaprin Orgaran Warfarin Sodium  Buff-a-Comp Enoxaparin Orudis Zorpin  Buff-a-Comp with Codeine Equegesic Os-Cal-Gesic   Buffaprin Excedrin plain, buffered or Extra Strength Oxalid   Bufferin Arthritis Strength Feldene Oxphenbutazone   Bufferin plain or Extra Strength Feldene Capsules Oxycodone with Aspirin   Bufferin with Codeine Fenoprofen Fenoprofen Pabalate or Pabalate-SF   Buffets II Flogesic Panagesic   Buffinol plain or Extra Strength Florinal or Florinal with Codeine Panwarfarin   Buf-Tabs Flurbiprofen Penicillamine   Butalbital Compound Four-way cold tablets Penicillin   Butazolidin Fragmin Pepto-Bismol   Carbenicillin Geminisyn Percodan   Carna Arthritis Reliever Geopen Persantine   Carprofen Gold's salt Persistin   Chloramphenicol Goody's Phenylbutazone   Chloromycetin Haltrain Piroxlcam   Clmetidine heparin Plaquenil   Cllnoril Hyco-pap Ponstel   Clofibrate Hydroxy chloroquine Propoxyphen         Before stopping any of  these medications, be sure to consult the physician who ordered them.  Some, such as Coumadin (Warfarin) are ordered to prevent or treat serious conditions such as "deep thrombosis", "pumonary embolisms", and other heart problems.  The amount of time that you may need off of the medication may also vary with the medication and the reason for which you were taking it.  If you are taking any of these medications, please make sure you notify your pain physician before you undergo any procedures.         Epidural Steroid Injection An epidural steroid injection is a shot of steroid medicine and numbing medicine that is given into the space between the spinal cord and the bones in your back (epidural space). The shot helps relieve pain caused by an irritated or swollen nerve root. The amount of pain relief you get from the injection depends on what is causing the nerve to be swollen and irritated, and how long your pain lasts. You are more likely to benefit from this injection if your pain is strong and comes on suddenly rather than if you have had pain for a long time. Tell a health care provider about:  Any allergies you have.  All medicines you are taking, including vitamins, herbs, eye drops, creams, and over-the-counter medicines.  Any problems you or family members have had with anesthetic medicines.  Any blood disorders you have.  Any surgeries you have had.  Any medical conditions you have.  Whether you are pregnant or may be pregnant. What are the risks? Generally, this is a safe procedure. However, problems may occur, including:  Headache.  Bleeding.  Infection.  Allergic reaction to medicines.  Damage to your nerves. What happens before the procedure? Staying hydrated  Follow instructions from your health care provider about hydration, which may include:  Up to 2 hours before the procedure - you may continue to drink clear liquids, such as water, clear fruit juice,  black coffee, and plain tea. Eating and drinking restrictions  Follow instructions from your health care provider about eating and drinking, which may include:  8 hours before the procedure - stop eating heavy meals or foods such as meat, fried foods, or fatty foods.  6 hours before the procedure - stop eating light meals or foods, such as toast or cereal.  6  hours before the procedure - stop drinking milk or drinks that contain milk.  2 hours before the procedure - stop drinking clear liquids. Medicine  You may be given medicines to lower anxiety.  Ask your health care provider about:  Changing or stopping your regular medicines. This is especially important if you are taking diabetes medicines or blood thinners.  Taking medicines such as aspirin and ibuprofen. These medicines can thin your blood. Do not take these medicines before your procedure if your health care provider instructs you not to. General instructions  Plan to have someone take you home from the hospital or clinic. What happens during the procedure?  You may receive a medicine to help you relax (sedative).  You will be asked to lie on your abdomen.  The injection site will be cleaned.  A numbing medicine (local anesthetic) will be used to numb the injection site.  A needle will be inserted through your skin into the epidural space. You may feel some discomfort when this happens. An X-ray machine will be used to make sure the needle is put as close as possible to the affected nerve.  A steroid medicine and a local anesthetic will be injected into the epidural space.  The needle will be removed.  A bandage (dressing) will be put over the injection site. What happens after the procedure?  Your blood pressure, heart rate, breathing rate, and blood oxygen level will be monitored until the medicines you were given have worn off.  Your arm or leg may feel weak or numb for a few hours.  The injection site may  feel sore.  Do not drive for 24 hours if you received a sedative. This information is not intended to replace advice given to you by your health care provider. Make sure you discuss any questions you have with your health care provider. Document Released: 12/24/2007 Document Revised: 02/28/2016 Document Reviewed: 01/02/2016 Elsevier Interactive Patient Education  2017 [employer].

## 2016-09-09 NOTE — Progress Notes (Signed)
Safety precautions to be maintained throughout the outpatient stay will include: orient to surroundings, keep bed in low position, maintain call bell within reach at all times, provide assistance with transfer out of bed and ambulation.  

## 2016-09-09 NOTE — Progress Notes (Signed)
Observed 5 1/2 tabs of Tylenol with Codeine #4 in toilet.  Witness # 2 [facility]

## 2016-09-10 ENCOUNTER — Telehealth: Payer: Self-pay

## 2016-09-10 NOTE — Telephone Encounter (Signed)
Patient states she is missing a prescription from yesterday. Please call her

## 2016-09-10 NOTE — Telephone Encounter (Signed)
Attempted to call patient, message left with son.

## 2016-09-11 ENCOUNTER — Encounter: Payer: Self-pay | Admitting: Pain Medicine

## 2016-09-11 ENCOUNTER — Ambulatory Visit: Payer: Medicaid Other | Attending: Pain Medicine | Admitting: Pain Medicine

## 2016-09-11 ENCOUNTER — Ambulatory Visit
Admission: RE | Admit: 2016-09-11 | Discharge: 2016-09-11 | Disposition: A | Discharge status: Home / Self Care | Payer: Medicaid Other | Source: Ambulatory Visit | Attending: Pain Medicine | Admitting: Pain Medicine

## 2016-09-11 VITALS — BP 109/63 | HR 71 | Temp 98.5°F | Resp 14 | Ht 63.0 in | Wt 203.0 lb

## 2016-09-11 DIAGNOSIS — M4722 Other spondylosis with radiculopathy, cervical region: Secondary | ICD-10-CM | POA: Diagnosis not present

## 2016-09-11 DIAGNOSIS — Z880 Allergy status to penicillin: Secondary | ICD-10-CM | POA: Insufficient documentation

## 2016-09-11 DIAGNOSIS — Z885 Allergy status to narcotic agent status: Secondary | ICD-10-CM | POA: Insufficient documentation

## 2016-09-11 DIAGNOSIS — M5481 Occipital neuralgia: Secondary | ICD-10-CM

## 2016-09-11 DIAGNOSIS — G8929 Other chronic pain: Secondary | ICD-10-CM

## 2016-09-11 DIAGNOSIS — M5412 Radiculopathy, cervical region: Secondary | ICD-10-CM

## 2016-09-11 DIAGNOSIS — M542 Cervicalgia: Secondary | ICD-10-CM

## 2016-09-11 DIAGNOSIS — Z882 Allergy status to sulfonamides status: Secondary | ICD-10-CM | POA: Insufficient documentation

## 2016-09-11 DIAGNOSIS — Z881 Allergy status to other antibiotic agents status: Secondary | ICD-10-CM | POA: Diagnosis not present

## 2016-09-11 DIAGNOSIS — Z91040 Latex allergy status: Secondary | ICD-10-CM | POA: Diagnosis not present

## 2016-09-11 DIAGNOSIS — Z886 Allergy status to analgesic agent status: Secondary | ICD-10-CM | POA: Insufficient documentation

## 2016-09-11 DIAGNOSIS — M4712 Other spondylosis with myelopathy, cervical region: Secondary | ICD-10-CM | POA: Diagnosis not present

## 2016-09-11 DIAGNOSIS — Z888 Allergy status to other drugs, medicaments and biological substances status: Secondary | ICD-10-CM | POA: Diagnosis not present

## 2016-09-11 MED ORDER — DEXAMETHASONE SODIUM PHOSPHATE 4 MG/ML IJ SOLN
10.0000 mg | Freq: Once | INTRAMUSCULAR | Status: AC
  Administered 2016-09-11: 10 mg
  Filled 2016-09-11: qty 3

## 2016-09-11 MED ORDER — LIDOCAINE HCL (PF) 1 % IJ SOLN
10.0000 mL | Freq: Once | INTRAMUSCULAR | Status: AC
  Administered 2016-09-11: 10 mL
  Filled 2016-09-11: qty 10

## 2016-09-11 MED ORDER — MIDAZOLAM HCL 5 MG/5ML IJ SOLN
1.0000 mg | INTRAMUSCULAR | Status: DC | PRN
  Administered 2016-09-11: 3 mg via INTRAVENOUS
  Filled 2016-09-11: qty 5

## 2016-09-11 MED ORDER — LACTATED RINGERS IV SOLN: 1000.0000 mL | Freq: Once | INTRAVENOUS | Status: DC

## 2016-09-11 MED ORDER — SODIUM CHLORIDE 0.9 % IJ SOLN
INTRAMUSCULAR | Status: AC
  Filled 2016-09-11: qty 10

## 2016-09-11 MED ORDER — SODIUM CHLORIDE 0.9% FLUSH
1.0000 mL | Freq: Once | INTRAVENOUS | Status: AC
  Administered 2016-09-11: 1 mL

## 2016-09-11 MED ORDER — ROPIVACAINE HCL 2 MG/ML IJ SOLN
1.0000 mL | Freq: Once | INTRAMUSCULAR | Status: AC
  Administered 2016-09-11: 1 mL via EPIDURAL
  Filled 2016-09-11: qty 10
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
## 2016-09-11 NOTE — Patient Instructions (Signed)

## 2016-09-12 ENCOUNTER — Telehealth: Payer: Self-pay | Admitting: *Deleted

## 2016-09-12 NOTE — Telephone Encounter (Signed)
Patient denies problems post procedure . Does have a headache since yesterday afternoon, but that is the symptom we are treating. Instructed to call for any concerns or worsening /new symptoms of spinal HA.

## 2016-09-16 ENCOUNTER — Ambulatory Visit (INDEPENDENT_AMBULATORY_CARE_PROVIDER_SITE_OTHER): Payer: Medicaid Other | Admitting: Family Medicine

## 2016-09-16 ENCOUNTER — Encounter: Payer: Self-pay | Admitting: Family Medicine

## 2016-09-16 DIAGNOSIS — M722 Plantar fascial fibromatosis: Secondary | ICD-10-CM

## 2016-09-16 DIAGNOSIS — M25571 Pain in right ankle and joints of right foot: Secondary | ICD-10-CM | POA: Diagnosis not present

## 2016-09-16 DIAGNOSIS — G8929 Other chronic pain: Secondary | ICD-10-CM

## 2016-09-16 DIAGNOSIS — M25572 Pain in left ankle and joints of left foot: Secondary | ICD-10-CM

## 2016-09-16 MED ORDER — DOXYCYCLINE HYCLATE 100 MG PO TABS: 100.0000 mg | ORAL_TABLET | Freq: Two times a day (BID) | ORAL | 0 refills | Status: AC

## 2016-09-16 NOTE — Assessment & Plan Note (Signed)
Refer to podiatry
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
## 2016-09-16 NOTE — Patient Instructions (Signed)
We'll have you see the podiatrist Take another seven days of antibiotics Please do eat yogurt daily or take a probiotic daily for the next month or two We want to replace the healthy germs in the gut If you notice foul, watery diarrhea in the next two months, schedule an appointment RIGHT AWAY

## 2016-09-16 NOTE — Assessment & Plan Note (Signed)
Currently, left is significant, affecting activities, refer to podiatry

## 2016-09-17 ENCOUNTER — Telehealth: Payer: Self-pay

## 2016-09-17 NOTE — Telephone Encounter (Signed)
Patient called and informed that the prior auth was sent on 09-12-2016.

## 2016-09-17 NOTE — Telephone Encounter (Signed)
Pharmacy would like prior auth request to be completed so that patient can get her meds. I did let them know that you all are probably working on this already.

## 2016-09-19 ENCOUNTER — Telehealth: Payer: Self-pay

## 2016-09-19 NOTE — Telephone Encounter (Signed)
Called about her husband brain Joaquin. Notes are in his chart.

## 2016-09-25 ENCOUNTER — Telehealth: Payer: Self-pay | Admitting: Pain Medicine

## 2016-09-25 NOTE — Telephone Encounter (Signed)
Talked to pharmacy and PA has been approved for Lyrica and Hydrocodone APAP.  Patient called to let her know that PA has been approved.

## 2016-09-25 NOTE — Telephone Encounter (Signed)
Patient called about meds stating pharmacy told her the prior auth was not completed, please let patient know status and she also wants to know if she can take some Tylenol 4 that she found ?

## 2016-10-03 ENCOUNTER — Ambulatory Visit: Payer: Self-pay | Admitting: Family Medicine

## 2016-10-03 ENCOUNTER — Ambulatory Visit (INDEPENDENT_AMBULATORY_CARE_PROVIDER_SITE_OTHER): Payer: BLUE CROSS/BLUE SHIELD

## 2016-10-03 ENCOUNTER — Ambulatory Visit (INDEPENDENT_AMBULATORY_CARE_PROVIDER_SITE_OTHER): Payer: BLUE CROSS/BLUE SHIELD | Admitting: Podiatry

## 2016-10-03 ENCOUNTER — Ambulatory Visit: Payer: BLUE CROSS/BLUE SHIELD

## 2016-10-03 ENCOUNTER — Encounter: Payer: Self-pay | Admitting: Podiatry

## 2016-10-03 VITALS — BP 117/80 | HR 84 | Resp 16 | Ht 63.0 in | Wt 205.0 lb

## 2016-10-03 DIAGNOSIS — M25571 Pain in right ankle and joints of right foot: Secondary | ICD-10-CM

## 2016-10-03 DIAGNOSIS — M25572 Pain in left ankle and joints of left foot: Secondary | ICD-10-CM | POA: Diagnosis not present

## 2016-10-03 DIAGNOSIS — M722 Plantar fascial fibromatosis: Secondary | ICD-10-CM

## 2016-10-03 MED ORDER — TRIAMCINOLONE ACETONIDE 10 MG/ML IJ SUSP
10.0000 mg | Freq: Once | INTRAMUSCULAR | Status: AC
  Administered 2016-10-03: 10 mg

## 2016-10-03 NOTE — Patient Instructions (Signed)

## 2016-10-03 NOTE — Progress Notes (Signed)
Subjective:     Patient ID: Dana Bradley, female   DOB: December 05, 1970, 46 y.o.   MRN: [id number]  HPI patient presents stating that the left heel has really started to bother her and that long-term she'll probably need some kind of inserts due to her foot structure states it's been going on for several months   Review of Systems  All other systems reviewed and are negative.      Objective:   Physical Exam  Constitutional: She is oriented to person, place, and time.  Cardiovascular: Intact distal pulses.   Musculoskeletal: Normal range of motion.  Neurological: She is oriented to person, place, and time.  Skin: Skin is warm.  Nursing note and vitals reviewed.  neurovascular status intact muscle strength adequate range of motion within normal limits with patient found to have discomfort plantar aspect left heel at the insertional point tendon into the calcaneus with inflammation fluid buildup noted. Patient's found have good digital perfusion and is well oriented 3     Assessment:     Fasciitis plantar heel left with inflammation fluid around the medial band    Plan:     H&P condition reviewed and careful injection Mr. left 3 mg, problems also Marcaine mixture and applied fascial brace with instructions on usage. Instructed on reduced activity and discussed long-term orthotics which we will do it next visit  X-ray report indicate spur formation no indication stress fracture arthritis

## 2016-10-09 ENCOUNTER — Ambulatory Visit: Payer: Self-pay | Admitting: Podiatry

## 2016-10-14 ENCOUNTER — Ambulatory Visit: Payer: Medicaid Other | Admitting: Pain Medicine

## 2016-10-17 ENCOUNTER — Ambulatory Visit: Payer: BLUE CROSS/BLUE SHIELD | Admitting: Podiatry

## 2016-10-21 ENCOUNTER — Ambulatory Visit (INDEPENDENT_AMBULATORY_CARE_PROVIDER_SITE_OTHER): Payer: BLUE CROSS/BLUE SHIELD | Admitting: Family Medicine

## 2016-10-21 ENCOUNTER — Encounter: Payer: Self-pay | Admitting: Family Medicine

## 2016-10-21 VITALS — BP 116/82 | HR 100 | Temp 98.8°F | Resp 14 | Wt 210.0 lb

## 2016-10-21 DIAGNOSIS — R2689 Other abnormalities of gait and mobility: Secondary | ICD-10-CM

## 2016-10-21 DIAGNOSIS — M533 Sacrococcygeal disorders, not elsewhere classified: Secondary | ICD-10-CM

## 2016-10-21 DIAGNOSIS — F3340 Major depressive disorder, recurrent, in remission, unspecified: Secondary | ICD-10-CM

## 2016-10-21 DIAGNOSIS — M797 Fibromyalgia: Secondary | ICD-10-CM | POA: Diagnosis not present

## 2016-10-21 DIAGNOSIS — G894 Chronic pain syndrome: Secondary | ICD-10-CM

## 2016-10-21 NOTE — Progress Notes (Signed)
BP 116/82   Pulse 100   Temp 98.8 F (37.1 C) (Oral)   Resp 14   Wt 210 lb (95.3 kg)   LMP 10/03/2016   SpO2 98%   BMI 37.20 kg/m    Subjective:    Patient ID: Dana Bradley, female    DOB: 1971-07-25, 46 y.o.   MRN: [id number]  HPI: Dana Bradley is a 46 y.o. female  Chief Complaint  Patient presents with  . Nutrition Counseling  . Tailbone Pain   Pain in her tailbone area; hurts with sitting for any length of time; going on for 6 weeks, not getting better; she doesn't know what caused it; thought it was muscles at top of clefty; anything that pushes up hurts; two weeks after it started, slid getting into the car, hit the side of the seat where it's sore; made it worse, but not excruciating; fell down set of stairs a long time ago; back is a real problem, but this is separate; having to use cane and sometimes walker because of her back; worse with sitting this pain, then when standing up, that is terribly painful Sitz baths not doing much; donut pillow, neck pillow helps some; trying to sit on cushions No pain with BMs; no pain with urination; no change in periods; had uterine ablation, no heavy bleeding; endometriosis runs in the family; pain does not change through period Numbness going down the legs, 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 Leonides Schanz is her gynecologist; saw Dr. Leonides Schanz on Jan 4th and did pelvic exam; going to see her soon for pessary Dr. Dossie Arbour did full body scan (not really, but xrayed a lot of body parts) She has kidney stones, Zara Council says kidney stones are not the cause of the pain kegels make the pain worse  She says that her pain clinic doctor gave her a website for inflammation diet; foods including apples are bad, she just isn't sure about it; we reviewed it together  IBS worse with meat or cheese; kombucha and  yogurt  Filing for disability; fibro, back pain, balance is off, can't find words; has talked to neurologist about this; sees Dr. Manuella Ghazi for migraines; white matter disease on brain imaging; getting worse last six months; she'll go back to him  Depression screen [facility] 2/9 10/30/2016 10/23/2016 09/16/2016 08/28/2016 08/07/2016  Decreased Interest 0 0 0 0 0  Down, Depressed, Hopeless 0 0 0 1 0  PHQ - 2 Score 0 0 0 1 0  Altered sleeping - - - - -  Tired, decreased energy - - - - -  Change in appetite - - - - -  Feeling bad or failure about yourself  - - - - -  Trouble concentrating - - - - -  Moving slowly or fidgety/restless - - - - -  Suicidal thoughts - - - - -  PHQ-9 Score - - - - -  Difficult doing work/chores - - - - -   Relevant past medical, surgical, family and social history reviewed Past Medical History:  Diagnosis Date  . Bursitis   . Edema leg 05/02/2015  . Fibromyalgia   . GERD (gastroesophageal reflux disease)   . IBS (irritable bowel syndrome)   . Knee pain, bilateral 12/21/2008   Qualifier: Diagnosis of  By: Hassell Done FNP, Tori Milks    .  Lumbar discitis   . Osteoarthritis   . Right hand pain 04/10/2015   [facility] has done nerve conduction studies and ruled out carpal tunnel.   . Sleep apnea   . SVT (supraventricular tachycardia) ([place])   . Vertigo   . Vitamin D deficiency 05/01/2016   Past Surgical History:  Procedure Laterality Date  . ABLATION     Uterine  . CARDIAC CATHETERIZATION    . COLONOSCOPY WITH PROPOFOL N/A 05/17/2015   Procedure: COLONOSCOPY WITH PROPOFOL;  Surgeon: Manya Silvas, MD;  Location: [facility] ENDOSCOPY;  Service: Endoscopy;  Laterality: N/A;  . ESOPHAGOGASTRODUODENOSCOPY N/A 05/17/2015   Procedure: ESOPHAGOGASTRODUODENOSCOPY (EGD);  Surgeon: Manya Silvas, MD;  Location[facility] ENDOSCOPY;  Service: Endoscopy;  Laterality: N/A;  . KNEE ARTHROSCOPY    . TUBAL LIGATION  10/01/99   Family History  Problem Relation Age of Onset  . Depression  Mother   . Hypertension Mother   . Cancer Mother     Skin  . Hyperlipidemia Mother   . Alcohol abuse Father   . Depression Father   . Stroke Father   . Heart disease Father   . Hypertension Father   . Depression Sister   . Hyperlipidemia Sister   . Diabetes Sister   . Cancer Maternal Grandmother 34    Breast  . Polycystic ovary syndrome Sister   . Alzheimer's disease    . Bladder Cancer Neg Hx   . Kidney cancer Neg Hx    Social History  Substance Use Topics  . Smoking status: Former Smoker    Packs/day: 4.00    Years: 3.00    Types: Cigarettes    Quit date: 12/10/1992  . Smokeless tobacco: Never Used  . Alcohol use No   Interim medical history since last visit reviewed. Allergies and medications reviewed  Review of Systems Per HPI unless specifically indicated above     Objective:    BP 116/82   Pulse 100   Temp 98.8 F (37.1 C) (Oral)   Resp 14   Wt 210 lb (95.3 kg)   LMP 10/03/2016   SpO2 98%   BMI 37.20 kg/m   Wt Readings from Last 3 Encounters:  10/30/16 208 lb (94.3 kg)  10/23/16 210 lb (95.3 kg)  10/21/16 210 lb (95.3 kg)    Physical Exam  Constitutional: She appears well-developed and well-nourished.  HENT:  Mouth/Throat: Mucous membranes are normal.  Eyes: EOM are normal. No scleral icterus.  Cardiovascular: Normal rate and regular rhythm.   Pulmonary/Chest: Effort normal and breath sounds normal.  Neurological: Gait normal.  LE strength 5/5  Psychiatric: She has a normal mood and affect. Her behavior is normal. Her mood appears not anxious. She does not exhibit a depressed mood.      Assessment & Plan:   Problem List Items Addressed This Visit      Other   Fibromyalgia (Chronic)    Patient filing for disability; fibro "fog" versus other neurologic issue; white matter disease on brain imaging per patient; she'll follow back up with Dr. Manuella Ghazi about her progressive symptoms to see if other process going on      Depression, major,  recurrent, in remission ([place])    Controlled, PHQ-2 score of zero today      Chronic pain syndrome (Chronic)    We reviewed the anti-inflammatory diet suggested by pain clinic doctor       Other Visit Diagnoses    Coccygeal pain    -  Primary  discussed ddx, work-up thus far; I can order imaging; she'll let me know if anything desired   Balance problem       I did not witness any worrisome cerebellar signs today; she'll follow up with her neurologist      Follow up plan: No Follow-up on file.  An after-visit summary was printed and given to the patient at [place].  Please see the patient instructions which may contain other information and recommendations beyond what is mentioned above in the assessment and plan.  Meds ordered this encounter  Medications  . DISCONTD: pregabalin (LYRICA) 150 MG capsule    Sig: Take 150 mg by mouth 3 (three) times daily.    No orders of the defined types were placed in this encounter.

## 2016-10-21 NOTE — Patient Instructions (Addendum)
Talk with Dr. Dossie Arbour about your pain Let me know if you'd like to see the pelvic floor physical therapist Let me know how I can help

## 2016-10-23 ENCOUNTER — Ambulatory Visit: Payer: BLUE CROSS/BLUE SHIELD | Attending: Pain Medicine | Admitting: Pain Medicine

## 2016-10-23 ENCOUNTER — Encounter: Payer: Self-pay | Admitting: Pain Medicine

## 2016-10-23 VITALS — BP 120/74 | HR 86 | Temp 98.3°F | Resp 16 | Ht 63.0 in | Wt 210.0 lb

## 2016-10-23 DIAGNOSIS — Z823 Family history of stroke: Secondary | ICD-10-CM | POA: Insufficient documentation

## 2016-10-23 DIAGNOSIS — M79601 Pain in right arm: Secondary | ICD-10-CM | POA: Insufficient documentation

## 2016-10-23 DIAGNOSIS — R51 Headache: Secondary | ICD-10-CM | POA: Diagnosis present

## 2016-10-23 DIAGNOSIS — K219 Gastro-esophageal reflux disease without esophagitis: Secondary | ICD-10-CM | POA: Insufficient documentation

## 2016-10-23 DIAGNOSIS — G4700 Insomnia, unspecified: Secondary | ICD-10-CM | POA: Insufficient documentation

## 2016-10-23 DIAGNOSIS — Z811 Family history of alcohol abuse and dependence: Secondary | ICD-10-CM | POA: Insufficient documentation

## 2016-10-23 DIAGNOSIS — M542 Cervicalgia: Secondary | ICD-10-CM

## 2016-10-23 DIAGNOSIS — Z8249 Family history of ischemic heart disease and other diseases of the circulatory system: Secondary | ICD-10-CM | POA: Insufficient documentation

## 2016-10-23 DIAGNOSIS — M4712 Other spondylosis with myelopathy, cervical region: Secondary | ICD-10-CM | POA: Diagnosis not present

## 2016-10-23 DIAGNOSIS — Z6837 Body mass index (BMI) 37.0-37.9, adult: Secondary | ICD-10-CM | POA: Diagnosis not present

## 2016-10-23 DIAGNOSIS — M5481 Occipital neuralgia: Secondary | ICD-10-CM

## 2016-10-23 DIAGNOSIS — Z87442 Personal history of urinary calculi: Secondary | ICD-10-CM | POA: Diagnosis not present

## 2016-10-23 DIAGNOSIS — K589 Irritable bowel syndrome without diarrhea: Secondary | ICD-10-CM | POA: Insufficient documentation

## 2016-10-23 DIAGNOSIS — I10 Essential (primary) hypertension: Secondary | ICD-10-CM | POA: Diagnosis not present

## 2016-10-23 DIAGNOSIS — G8929 Other chronic pain: Secondary | ICD-10-CM

## 2016-10-23 DIAGNOSIS — Z79891 Long term (current) use of opiate analgesic: Secondary | ICD-10-CM | POA: Insufficient documentation

## 2016-10-23 DIAGNOSIS — Z79899 Other long term (current) drug therapy: Secondary | ICD-10-CM | POA: Diagnosis not present

## 2016-10-23 DIAGNOSIS — M5412 Radiculopathy, cervical region: Secondary | ICD-10-CM | POA: Diagnosis not present

## 2016-10-23 DIAGNOSIS — E559 Vitamin D deficiency, unspecified: Secondary | ICD-10-CM | POA: Diagnosis not present

## 2016-10-23 DIAGNOSIS — Z833 Family history of diabetes mellitus: Secondary | ICD-10-CM | POA: Insufficient documentation

## 2016-10-23 DIAGNOSIS — Z886 Allergy status to analgesic agent status: Secondary | ICD-10-CM | POA: Insufficient documentation

## 2016-10-23 DIAGNOSIS — G4733 Obstructive sleep apnea (adult) (pediatric): Secondary | ICD-10-CM | POA: Diagnosis not present

## 2016-10-23 DIAGNOSIS — Z888 Allergy status to other drugs, medicaments and biological substances status: Secondary | ICD-10-CM | POA: Insufficient documentation

## 2016-10-23 DIAGNOSIS — Z87891 Personal history of nicotine dependence: Secondary | ICD-10-CM | POA: Diagnosis not present

## 2016-10-23 DIAGNOSIS — E785 Hyperlipidemia, unspecified: Secondary | ICD-10-CM | POA: Insufficient documentation

## 2016-10-23 DIAGNOSIS — Z885 Allergy status to narcotic agent status: Secondary | ICD-10-CM | POA: Insufficient documentation

## 2016-10-23 DIAGNOSIS — Z818 Family history of other mental and behavioral disorders: Secondary | ICD-10-CM | POA: Insufficient documentation

## 2016-10-23 DIAGNOSIS — E538 Deficiency of other specified B group vitamins: Secondary | ICD-10-CM | POA: Diagnosis not present

## 2016-10-23 DIAGNOSIS — E669 Obesity, unspecified: Secondary | ICD-10-CM | POA: Diagnosis not present

## 2016-10-23 DIAGNOSIS — M549 Dorsalgia, unspecified: Secondary | ICD-10-CM

## 2016-10-23 DIAGNOSIS — M4722 Other spondylosis with radiculopathy, cervical region: Secondary | ICD-10-CM | POA: Insufficient documentation

## 2016-10-23 DIAGNOSIS — R252 Cramp and spasm: Secondary | ICD-10-CM | POA: Insufficient documentation

## 2016-10-23 DIAGNOSIS — M79602 Pain in left arm: Secondary | ICD-10-CM

## 2016-10-23 DIAGNOSIS — Z880 Allergy status to penicillin: Secondary | ICD-10-CM | POA: Insufficient documentation

## 2016-10-23 DIAGNOSIS — G894 Chronic pain syndrome: Secondary | ICD-10-CM

## 2016-10-23 DIAGNOSIS — M797 Fibromyalgia: Secondary | ICD-10-CM | POA: Diagnosis not present

## 2016-10-23 DIAGNOSIS — M5137 Other intervertebral disc degeneration, lumbosacral region: Secondary | ICD-10-CM | POA: Insufficient documentation

## 2016-10-23 DIAGNOSIS — Z5181 Encounter for therapeutic drug level monitoring: Secondary | ICD-10-CM | POA: Diagnosis not present

## 2016-10-23 DIAGNOSIS — F329 Major depressive disorder, single episode, unspecified: Secondary | ICD-10-CM | POA: Insufficient documentation

## 2016-10-23 DIAGNOSIS — K6289 Other specified diseases of anus and rectum: Secondary | ICD-10-CM | POA: Insufficient documentation

## 2016-10-23 DIAGNOSIS — Z882 Allergy status to sulfonamides status: Secondary | ICD-10-CM | POA: Insufficient documentation

## 2016-10-23 DIAGNOSIS — Z809 Family history of malignant neoplasm, unspecified: Secondary | ICD-10-CM | POA: Insufficient documentation

## 2016-10-23 DIAGNOSIS — R262 Difficulty in walking, not elsewhere classified: Secondary | ICD-10-CM | POA: Insufficient documentation

## 2016-10-23 NOTE — Progress Notes (Signed)
Safety precautions to be maintained throughout the outpatient stay will include: orient to surroundings, keep bed in low position, maintain call bell within reach at all times, provide assistance with transfer out of bed and ambulation.  

## 2016-10-23 NOTE — Patient Instructions (Signed)
Epidural Steroid Injection Patient Information  Description: The epidural space surrounds the nerves as they exit the spinal cord.  In some patients, the nerves can be compressed and inflamed by a bulging disc or a tight spinal canal (spinal stenosis).  By injecting steroids into the epidural space, we can bring irritated nerves into direct contact with a potentially helpful medication.  These steroids act directly on the irritated nerves and can reduce swelling and inflammation which often leads to decreased pain.  Epidural steroids may be injected anywhere along the spine and from the neck to the low back depending upon the location of your pain.   After numbing the skin with local anesthetic (like Novocaine), a small needle is passed into the epidural space slowly.  You may experience a sensation of pressure while this is being done.  The entire block usually last less than 10 minutes.  Conditions which may be treated by epidural steroids:   Low back and leg pain  Neck and arm pain  Spinal stenosis  Post-laminectomy syndrome  Herpes zoster (shingles) pain  Pain from compression fractures  Preparation for the injection:  1. Do not eat any solid food or dairy products within 8 hours of your appointment.  2. You may drink clear liquids up to 3 hours before appointment.  Clear liquids include water, black coffee, juice or soda.  No milk or cream please. 3. You may take your regular medication, including pain medications, with a sip of water before your appointment  Diabetics should hold regular insulin (if taken separately) and take 1/2 normal NPH dos the morning of the procedure.  Carry some sugar containing items with you to your appointment. 4. A driver must accompany you and be prepared to drive you home after your procedure.  5. Bring all your current medications with your. 6. An IV may be inserted and sedation may be given at the discretion of the physician.   7. A blood pressure  cuff, EKG and other monitors will often be applied during the procedure.  Some patients may need to have extra oxygen administered for a short period. 8. You will be asked to provide medical information, including your allergies, prior to the procedure.  We must know immediately if you are taking blood thinners (like Coumadin/Warfarin)  Or if you are allergic to IV iodine contrast (dye). We must know if you could possible be pregnant.  Possible side-effects:  Bleeding from needle site  Infection (rare, may require surgery)  Nerve injury (rare)  Numbness & tingling (temporary)  Difficulty urinating (rare, temporary)  Spinal headache ( a headache worse with upright posture)  Light -headedness (temporary)  Pain at injection site (several days)  Decreased blood pressure (temporary)  Weakness in arm/leg (temporary)  Pressure sensation in back/neck (temporary)  Call if you experience:  Fever/chills associated with headache or increased back/neck pain.  Headache worsened by an upright position.  New onset weakness or numbness of an extremity below the injection site  Hives or difficulty breathing (go to the emergency room)  Inflammation or drainage at the infection site  Severe back/neck pain  Any new symptoms which are concerning to you  Please note:  Although the local anesthetic injected can often make your back or neck feel good for several hours after the injection, the pain will likely return.  It takes 3-7 days for steroids to work in the epidural space.  You may not notice any pain relief for at least that one week.    If effective, we will often do a series of three injections spaced 3-6 weeks apart to maximally decrease your pain.  After the initial series, we generally will wait several months before considering a repeat injection of the same type.  If you have any questions, please call [phone] [facility] R[facility] AND  COMPLICATIONS  What are the risk, side effects and possible complications? Generally speaking, most procedures are safe.  However, with any procedure there are risks, side effects, and the possibility of complications.  The risks and complications are dependent upon the sites that are lesioned, or the type of nerve block to be performed.  The closer the procedure is to the spine, the more serious the risks are.  Great care is taken when placing the radio frequency needles, block needles or lesioning probes, but sometimes complications can occur. 1. Infection: Any time there is an injection through the skin, there is a risk of infection.  This is why sterile conditions are used for these blocks.  There are four possible types of infection. 1. Localized skin infection. 2. Central Nervous System Infection-This can be in the form of Meningitis, which can be deadly. 3. Epidural Infections-This can be in the form of an epidural abscess, which can cause pressure inside of the spine, causing compression of the spinal cord with subsequent paralysis. This would require an emergency surgery to decompress, and there are no guarantees that the patient would recover from the paralysis. 4. Discitis-This is an infection of the intervertebral discs.  It occurs in about 1% of discography procedures.  It is difficult to treat and it may lead to surgery.        2. Pain: the needles have to go through skin and soft tissues, will cause soreness.       3. Damage to internal structures:  The nerves to be lesioned may be near blood vessels or    other nerves which can be potentially damaged.       4. Bleeding: Bleeding is more common if the patient is taking blood thinners such as  aspirin, Coumadin, Ticiid, Plavix, etc., or if he/she have some genetic predisposition  such as hemophilia. Bleeding into the spinal canal can cause compression of the spinal  cord with subsequent paralysis.  This would require an emergency surgery  to  decompress and there are no guarantees that the patient would recover from the  paralysis.       5. Pneumothorax:  Puncturing of a lung is a possibility, every time a needle is introduced in  the area of the chest or upper back.  Pneumothorax refers to free air around the  collapsed lung(s), inside of the thoracic cavity (chest cavity).  Another two possible  complications related to a similar event would include: Hemothorax and Chylothorax.   These are variations of the Pneumothorax, where instead of air around the collapsed  lung(s), you may have blood or chyle, respectively.       6. Spinal headaches: They may occur with any procedures in the area of the spine.       7. Persistent CSF (Cerebro-Spinal Fluid) leakage: This is a rare problem, but may occur  with prolonged intrathecal or epidural catheters either due to the formation of a fistulous  track or a dural tear.       8. Nerve damage: By working so close to the spinal cord, there is always a possibility of  nerve damage, which could be   as serious as a permanent spinal cord injury with  paralysis.       9. Death:  Although rare, severe deadly allergic reactions known as "Anaphylactic  reaction" can occur to any of the medications used.      10. Worsening of the symptoms:  We can always make thing worse.  What are the chances of something like this happening? Chances of any of this occuring are extremely low.  By statistics, you have more of a chance of getting killed in a motor vehicle accident: while driving to the hospital than any of the above occurring .  Nevertheless, you should be aware that they are possibilities.  In general, it is similar to taking a shower.  Everybody knows that you can slip, hit your head and get killed.  Does that mean that you should not shower again?  Nevertheless always keep in mind that statistics do not mean anything if you happen to be on the wrong side of them.  Even if a procedure has a 1 (one) in a 1,000,000  (million) chance of going wrong, it you happen to be that one..Also, keep in mind that by statistics, you have more of a chance of having something go wrong when taking medications.  Who should not have this procedure? If you are on a blood thinning medication (e.g. Coumadin, Plavix, see list of "Blood Thinners"), or if you have an active infection going on, you should not have the procedure.  If you are taking any blood thinners, please inform your physician.  How should I prepare for this procedure?  Do not eat or drink anything at least six hours prior to the procedure.  Bring a driver with you .  It cannot be a taxi.  Come accompanied by an adult that can drive you back, and that is strong enough to help you if your legs get weak or numb from the local anesthetic.  Take all of your medicines the morning of the procedure with just enough water to swallow them.  If you have diabetes, make sure that you are scheduled to have your procedure done first thing in the morning, whenever possible.  If you have diabetes, take only half of your insulin dose and notify our nurse that you have done so as soon as you arrive at the clinic.  If you are diabetic, but only take blood sugar pills (oral hypoglycemic), then do not take them on the morning of your procedure.  You may take them after you have had the procedure.  Do not take aspirin or any aspirin-containing medications, at least eleven (11) days prior to the procedure.  They may prolong bleeding.  Wear loose fitting clothing that may be easy to take off and that you would not mind if it got stained with Betadine or blood.  Do not wear any jewelry or perfume  Remove any nail coloring.  It will interfere with some of our monitoring equipment.  NOTE: Remember that this is not meant to be interpreted as a complete list of all possible complications.  Unforeseen problems may occur.  BLOOD THINNERS The following drugs contain aspirin or other  products, which can cause increased bleeding during surgery and should not be taken for 2 weeks prior to and 1 week after surgery.  If you should need take something for relief of minor pain, you may take acetaminophen which is found in Tylenol,m Datril, Anacin-3 and Panadol. It is not blood thinner. The products listed below   are.  Do not take any of the products listed below in addition to any listed on your instruction sheet.  A.P.C or A.P.C with Codeine Codeine Phosphate Capsules #3 Ibuprofen Ridaura  ABC compound Congesprin Imuran rimadil  Advil Cope Indocin Robaxisal  Alka-Seltzer Effervescent Pain Reliever and Antacid Coricidin or Coricidin-D  Indomethacin Rufen  Alka-Seltzer plus Cold Medicine Cosprin Ketoprofen S-A-C Tablets  Anacin Analgesic Tablets or Capsules Coumadin Korlgesic Salflex  Anacin Extra Strength Analgesic tablets or capsules CP-2 Tablets Lanoril Salicylate  Anaprox Cuprimine Capsules Levenox Salocol  Anexsia-D Dalteparin Magan Salsalate  Anodynos Darvon compound Magnesium Salicylate Sine-off  Ansaid Dasin Capsules Magsal Sodium Salicylate  Anturane Depen Capsules Marnal Soma  APF Arthritis pain formula Dewitt's Pills Measurin Stanback  Argesic Dia-Gesic Meclofenamic Sulfinpyrazone  Arthritis Bayer Timed Release Aspirin Diclofenac Meclomen Sulindac  Arthritis pain formula Anacin Dicumarol Medipren Supac  Analgesic (Safety coated) Arthralgen Diffunasal Mefanamic Suprofen  Arthritis Strength Bufferin Dihydrocodeine Mepro Compound Suprol  Arthropan liquid Dopirydamole Methcarbomol with Aspirin Synalgos  ASA tablets/Enseals Disalcid Micrainin Tagament  Ascriptin Doan's Midol Talwin  Ascriptin A/D Dolene Mobidin Tanderil  Ascriptin Extra Strength Dolobid Moblgesic Ticlid  Ascriptin with Codeine Doloprin or Doloprin with Codeine Momentum Tolectin  Asperbuf Duoprin Mono-gesic Trendar  Aspergum Duradyne Motrin or Motrin IB Triminicin  Aspirin plain, buffered or enteric  coated Durasal Myochrisine Trigesic  Aspirin Suppositories Easprin Nalfon Trillsate  Aspirin with Codeine Ecotrin Regular or Extra Strength Naprosyn Uracel  Atromid-S Efficin Naproxen Ursinus  Auranofin Capsules Elmiron Neocylate Vanquish  Axotal Emagrin Norgesic Verin  Azathioprine Empirin or Empirin with Codeine Normiflo Vitamin E  Azolid Emprazil Nuprin Voltaren  Bayer Aspirin plain, buffered or children's or timed BC Tablets or powders Encaprin Orgaran Warfarin Sodium  Buff-a-Comp Enoxaparin Orudis Zorpin  Buff-a-Comp with Codeine Equegesic Os-Cal-Gesic   Buffaprin Excedrin plain, buffered or Extra Strength Oxalid   Bufferin Arthritis Strength Feldene Oxphenbutazone   Bufferin plain or Extra Strength Feldene Capsules Oxycodone with Aspirin   Bufferin with Codeine Fenoprofen Fenoprofen Pabalate or Pabalate-SF   Buffets II Flogesic Panagesic   Buffinol plain or Extra Strength Florinal or Florinal with Codeine Panwarfarin   Buf-Tabs Flurbiprofen Penicillamine   Butalbital Compound Four-way cold tablets Penicillin   Butazolidin Fragmin Pepto-Bismol   Carbenicillin Geminisyn Percodan   Carna Arthritis Reliever Geopen Persantine   Carprofen Gold's salt Persistin   Chloramphenicol Goody's Phenylbutazone   Chloromycetin Haltrain Piroxlcam   Clmetidine heparin Plaquenil   Cllnoril Hyco-pap Ponstel   Clofibrate Hydroxy chloroquine Propoxyphen         Before stopping any of these medications, be sure to consult the physician who ordered them.  Some, such as Coumadin (Warfarin) are ordered to prevent or treat serious conditions such as "deep thrombosis", "pumonary embolisms", and other heart problems.  The amount of time that you may need off of the medication may also vary with the medication and the reason for which you were taking it.  If you are taking any of these medications, please make sure you notify your pain physician before you undergo any procedures.          

## 2016-10-23 NOTE — Progress Notes (Signed)
Patient's Name: Dana Bradley  MRN: [id number]  Referring Provider: Arnetha Courser, MD  DOB: Sep 14, 1971  PCP: Arnetha Courser, MD  DOS: 10/23/2016  Note by: Kathlen Brunswick. Dossie Arbour, MD  Service setting: Ambulatory outpatient  Specialty: Interventional Pain Management  Location: [facility] (AMB) Pain Management Facility    Patient type: Established   Primary Reason(s) for Visit: Encounter for prescription drug management & post-procedure evaluation of chronic illness with mild to moderate exacerbation(Level of risk: moderate) CC: Headache (base of the skull on the left side more intense but also happens on the right); Back Pain (lower back pain and into tailbone); and Rectal Pain (lower outer aspect that causes tailbone to ache and difficult walking. )  HPI  Dana Bradley is a 46 y.o. year old, female patient, who comes today for a post-procedure evaluation and medication management. She has Depression, major, recurrent, in remission ([place]); Hypertension, benign essential, goal below 140/90; History of PSVT (paroxysmal supraventricular tachycardia); GERD; Obstructive sleep apnea, adult; Menorrhagia; Cervico-occipital neuralgia; DDD (degenerative disc disease), lumbosacral; Paroxysmal supraventricular tachycardia ([place]); Chronic pain of multiple joints; Chronic superficial gastritis; Degeneration of intervertebral disc of lumbosacral region; Fibromyalgia; Insomnia; Migraine without aura and with status migrainosus, not intractable; Bilateral leg cramps; Obesity; GAD (generalized anxiety disorder); Fatigue; Atypical lymphocytosis; Vitamin D insufficiency; Panic disorder with agoraphobia; Depression, unspecified depression type; Chronic pain syndrome; Long term current use of opiate analgesic; Long term prescription opiate use; Opiate use; Long term prescription benzodiazepine use; Neurogenic pain; Musculoskeletal pain; Chronic low back pain (Location of Primary Source of Pain) (Bilateral) (R>L) (midline); Chronic upper back pain  (Location of Secondary source of pain) (Bilateral) (L>R); Chronic right lower quadrant pain; Thoracic radiculitis (Right) (T11 dermatome); Chronic occipital neuralgia (Location of Tertiary source of pain) (Bilateral) (L>R); Chronic neck pain; Chronic cervical radicular pain (Bilateral) (L>R); Chronic shoulder blade pain (Bilateral) (L>R); Chronic upper extremity pain (Bilateral) (R>L); Chronic knee pain (Bilateral) (R>L); Chronic ankle pain, bilateral; Cervical spondylosis with myelopathy and radiculopathy; Nephrolithiasis; Controlled substance agreement signed; Acute recurrent maxillary sinusitis; Plantar fasciitis of left foot; Vitamin B12 deficiency; Hyperlipidemia; Medication monitoring encounter; Intractable headache; Bilateral leg edema; and Bright red rectal bleeding on her problem list. Her primarily concern today is the Headache (base of the skull on the left side more intense but also happens on the right); Back Pain (lower back pain and into tailbone); and Rectal Pain (lower outer aspect that causes tailbone to ache and difficult walking. )  Pain Assessment: Self-Reported Pain Score: 3 /10             Reported level is compatible with observation.       Pain Type: Chronic pain Pain Location: Head (lower back and buttocks/tailbone) Pain Orientation: Right, Left (bilateral) Pain Descriptors / Indicators: Radiating, Burning, Stabbing, Penetrating (shoulder pain feels heavy and painful causing increased pain down arms  and palm of hand causing numbness and tingling. ) Pain Frequency: Intermittent  Ms. Zamorano was last seen on 09/25/2016 for a procedure. During today's appointment we reviewed Dana Bradley's post-procedure results, as well as her outpatient medication regimen. She returns today indicating that the frequency of her headaches have decreased significantly. However the intensity remains the same and now she has been experiencing pain in both upper extremities from the elbow down. This pain is  affecting all fingers and it also involves numbness. 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 For some reason the patient seems to be having a flareup  of a carpal tunnel syndrome. She denies typing or playing the piano but does admit to knitting for hours at a time.  Further details on both, my assessment(s), as well as the proposed treatment plan, please see below.  Controlled Substance Pharmacotherapy Assessment REMS (Risk Evaluation and Mitigation Strategy)  Analgesic: Otovent/APAP 7.5/325 one daily MME/day: 7.5 mg/day.  Janett Billow, RN  10/23/2016  1:52 PM  Sign at close encounter Safety precautions to be maintained throughout the outpatient stay will include: orient to surroundings, keep bed in low position, maintain call bell within reach at all times, provide assistance with transfer out of bed and ambulation.    Pharmacokinetics: Liberation and absorption (onset of action): WNL Distribution (time to peak effect): WNL Metabolism and excretion (duration of action): WNL         Pharmacodynamics: Desired effects: Analgesia: Dana Bradley reports >50% benefit. Functional ability: Patient reports that medication allows her to accomplish basic ADLs Clinically meaningful improvement in function (CMIF): Sustained CMIF goals met Perceived effectiveness: Described as relatively effective, allowing for increase in activities of daily living (ADL) Undesirable effects: Side-effects or Adverse reactions: None reported Monitoring:  PMP: Online review of the past 65-monthperiod conducted. Compliant with practice rules and regulations List of all UDS test(s) done:  Lab Results  Component Value Date   SUMMARY FINAL 08/07/2016   Last UDS on record: No results found for: TOXASSSELUR UDS interpretation: Compliant          Medication Assessment Form: Reviewed. Patient indicates being compliant with therapy Treatment  compliance: Compliant Risk Assessment Profile: Aberrant behavior: See prior evaluations. None observed or detected today Comorbid factors increasing risk of overdose: See prior notes. No additional risks detected today Risk of substance use disorder (SUD): Low Opioid Risk Tool (ORT) Total Score: 6  Interpretation Table:  Score <3 = Low Risk for SUD  Score between 4-7 = Moderate Risk for SUD  Score >8 = High Risk for Opioid Abuse   Risk Mitigation Strategies:  Patient Counseling: Covered Patient-Prescriber Agreement (PPA): Present and active  Notification to other healthcare providers: Done  Pharmacologic Plan: No change in therapy, at this time  Post-Procedure Assessment  09/25/2016 Procedure: Diagnostic left cervical epidural steroid injection #1 under fluoroscopic guidance and IV sedation Post-procedure pain score: 2/10 The patient initially came in indicating that her pain was at 1/10 and then left indicating that he was a 2/10 suggesting of the procedure actually worsened her condition. Influential Factors: BMI: 37.20 kg/m Intra-procedural challenges: None observed Assessment challenges: None detected         Post-procedural side-effects, adverse reactions, or complications: None reported Reported issues: None  Sedation: Sedation provided. When no sedatives are used, the analgesic levels obtained are directly associated to the effectiveness of the local anesthetics. However, when sedation is provided, the level of analgesia obtained during the initial 1 hour following the intervention, is believed to be the result of a combination of factors. These factors may include, but are not limited to: 1. The effectiveness of the local anesthetics used. 2. The effects of the analgesic(s) and/or anxiolytic(s) used. 3. The degree of discomfort experienced by the patient at the time of the procedure. 4. The patients ability and reliability in recalling and recording the events. 5. The  presence and influence of possible secondary gains and/or psychosocial factors. Reported result: Relief experienced during the 1st hour after the procedure: 80 % (headaches following procedure that were unrelieved for many days. patient felt good when she left and  once she returned home the pain began to increase or "ramp up".) (Ultra-Short Term Relief) Interpretative annotation: Analgesia during this period is likely to be Local Anesthetic and/or IV Sedative (Analgesic/Anxiolitic) related.          Effects of local anesthetic: The analgesic effects attained during this period are directly associated to the localized infiltration of local anesthetics and therefore cary significant diagnostic value as to the etiological location, or anatomical origin, of the pain. Expected duration of relief is directly dependent on the pharmacodynamics of the local anesthetic used. Long-acting (4-6 hours) anesthetics used.  Reported result: Relief during the next 4 to 6 hour after the procedure: 60 % (Short-Term Relief) Interpretative annotation: Complete relief would suggest area to be the source of the pain.          Long-term benefit: Defined as the period of time past the expected duration of local anesthetics. With the possible exception of prolonged sympathetic blockade from the local anesthetics, benefits during this period are typically attributed to, or associated with, other factors such as analgesic sensory neuropraxia, antiinflammatory effects, or beneficial biochemical changes provided by agents other than the local anesthetics Reported result: Extended relief following procedure: 70 % (headaches are less frequent, intensity is less when they occur.) (Long-Term Relief) Interpretative annotation: Good relief. This could suggest inflammation to be a significant component in the etiology to the pain.          Current benefits: Defined as persistent relief that continues at this point in time.   Reported  results: Treated area: >50 % [name] reports improvement in function Interpretative annotation: Ongoing benefits would suggest effective therapeutic approach  Interpretation: Results would suggest that repeating the procedure may be necessary, for therapeutic reasons  Laboratory Chemistry  Inflammation Markers Lab Results  Component Value Date   ESRSEDRATE 14 08/08/2016   CRP 1.4 (H) 08/08/2016   Renal Function Lab Results  Component Value Date   BUN 10 08/08/2016   CREATININE 0.77 08/08/2016   GFRAA >60 08/08/2016   GFRNONAA >60 08/08/2016   Hepatic Function Lab Results  Component Value Date   AST 25 08/08/2016   ALT 28 08/08/2016   ALBUMIN 4.1 08/08/2016   Electrolytes Lab Results  Component Value Date   NA 141 08/08/2016   K 4.1 08/08/2016   CL 107 08/08/2016   CALCIUM 9.4 08/08/2016   MG 2.2 08/08/2016   Pain Modulating Vitamins Lab Results  Component Value Date   VD25OH 17 (L) 04/29/2016   25OHVITD1 25 (L) 08/08/2016   25OHVITD2 15 08/08/2016   25OHVITD3 10 08/08/2016   VITAMINB12 183 08/08/2016   Coagulation Parameters Lab Results  Component Value Date   INR 1.01 04/23/2010   LABPROT 13.2 04/23/2010   PLT 267 04/29/2016   Cardiovascular Lab Results  Component Value Date   HGB 13.8 04/29/2016   HCT 40.0 04/29/2016   Note: Lab results reviewed.  Recent Diagnostic Imaging Review  Dg C-arm 1-60 Min-no Report  Result Date: 09/11/2016 There is no Radiologist interpretation  for this exam.  Note: Imaging results reviewed.          Meds  The patient has a current medication list which includes the following prescription(s): alprazolam, atorvastatin, calcium-vitamin d-vitamin k, vitamin d3, cyanocobalamin, lamotrigine, magnesium, metoprolol, NONFORMULARY OR COMPOUNDED ITEM, pregabalin, tizanidine, vitamin d (ergocalciferol), and hydrocodone-acetaminophen.  Current Outpatient Prescriptions on File Prior to Visit  Medication Sig  . ALPRAZolam  (XANAX) 1 MG tablet Take 1 tablet (1 mg total) by  mouth at bedtime as needed for anxiety.  Marland Kitchen atorvastatin (LIPITOR) 10 MG tablet Take 1 tablet (10 mg total) by mouth at bedtime. Every other day  . Cholecalciferol (VITAMIN D3) 2000 units capsule Take 1 capsule (2,000 Units total) by mouth daily.  . Cyanocobalamin (VITAMIN B-12 PO) Take 1,500 mcg by mouth daily.  Marland Kitchen lamoTRIgine (LAMICTAL) 100 MG tablet Take 1 tablet (100 mg total) by mouth daily. (note new instructions and strength) (Patient taking differently: Take 100 mg by mouth 2 (two) times daily. (note new instructions and strength))  . magnesium 30 MG tablet Take 30 mg by mouth daily.  . metoprolol (LOPRESSOR) 50 MG tablet Take 1 tablet (50 mg total) by mouth 2 (two) times daily.  . NONFORMULARY OR COMPOUNDED ITEM Place 1 spray into the nose every 5 (five) hours as needed (for severe headache).  . pregabalin (LYRICA) 150 MG capsule Take 1 capsule (150 mg total) by mouth every 8 (eight) hours.  Marland Kitchen tiZANidine (ZANAFLEX) 2 MG tablet Take 1 tablet (2 mg total) by mouth every 6 (six) hours as needed for muscle spasms.  . Vitamin D, Ergocalciferol, (DRISDOL) 50000 units CAPS capsule TAKE ONE CAPSULE BY MOUTH TWICE WEEKLY FOR 6 WEEKS  . HYDROcodone-acetaminophen (NORCO) 7.5-325 MG tablet Take 1 tablet by mouth every 6 (six) hours as needed for moderate pain.   No current facility-administered medications on file prior to visit.    ROS  Constitutional: Denies any fever or chills Gastrointestinal: No reported hemesis, hematochezia, vomiting, or acute GI distress Musculoskeletal: Denies any acute onset joint swelling, redness, loss of ROM, or weakness Neurological: No reported episodes of acute onset apraxia, aphasia, dysarthria, agnosia, amnesia, paralysis, loss of coordination, or loss of consciousness  Allergies  Ms. Geddis is allergic to aspirin; cymbalta [duloxetine hcl]; demerol [meperidine]; depakote [divalproex sodium]; haloperidol; iodinated  diagnostic agents; reglan [metoclopramide]; tramadol hcl; trazodone; compazine [prochlorperazine]; meloxicam; penicillins; tomato; bacitracin-neomycin-polymyxin; cephalosporins; ibuprofen; latex; neosporin [neomycin-bacitracin zn-polymyx]; nsaids; and sulfonamide derivatives.  PFSH  Drug: Ms. Troost  has no drug history on file. Alcohol:  reports that she does not drink alcohol. Tobacco:  reports that she quit smoking about 23 years ago. Her smoking use included Cigarettes. She has a 12.00 pack-year smoking history. She has never used smokeless tobacco. Medical:  has a past medical history of Bursitis; Edema leg (05/02/2015); Fibromyalgia; GERD (gastroesophageal reflux disease); IBS (irritable bowel syndrome); Knee pain, bilateral (12/21/2008); Lumbar discitis; Osteoarthritis; Right hand pain (04/10/2015); Sleep apnea; SVT (supraventricular tachycardia) ([place]); Vertigo; and Vitamin D deficiency (05/01/2016). Family: family history includes Alcohol abuse in her father; Cancer in her mother; Cancer (age of onset: 72) in her maternal grandmother; Depression in her father, mother, and sister; Diabetes in her sister; Heart disease in her father; Hyperlipidemia in her mother and sister; Hypertension in her father and mother; Polycystic ovary syndrome in her sister; Stroke in her father.  Past Surgical History:  Procedure Laterality Date  . ABLATION     Uterine  . CARDIAC CATHETERIZATION    . COLONOSCOPY WITH PROPOFOL N/A 05/17/2015   Procedure: COLONOSCOPY WITH PROPOFOL;  Surgeon: Manya Silvas, MD;  Location: [facility] ENDOSCOPY;  Service: Endoscopy;  Laterality: N/A;  . ESOPHAGOGASTRODUODENOSCOPY N/A 05/17/2015   Procedure: ESOPHAGOGASTRODUODENOSCOPY (EGD);  Surgeon: Manya Silvas, MD;  Location: [facility] ENDOSCOPY;  Service: Endoscopy;  Laterality: N/A;  . KNEE ARTHROSCOPY    . TUBAL LIGATION  10/01/99   Constitutional Exam  General appearance: Well nourished, well developed, and well hydrated. In no apparent  acute distress Vitals:   10/23/16 1334  BP: 120/74  Pulse: 86  Resp: 16  Temp: 98.3 F (36.8 C)  TempSrc: Oral  SpO2: 100%  Weight: 210 lb (95.3 kg)  Height: _0  (1.6 m)   BMI Assessment: Estimated body mass index is 37.2 kg/m as calculated from the following:   Height as of this encounter: _1  (1.6 m).   Weight as of this encounter: 210 lb (95.3 kg).  BMI interpretation table: BMI level Category Range association with higher incidence of chronic pain  <18 kg/m2 Underweight   18.5-24.9 kg/m2 Ideal body weight   25-29.9 kg/m2 Overweight Increased incidence by 20%  30-34.9 kg/m2 Obese (Class I) Increased incidence by 68%  35-39.9 kg/m2 Severe obesity (Class II) Increased incidence by 136%  >40 kg/m2 Extreme obesity (Class III) Increased incidence by 254%   BMI Readings from Last 4 Encounters:  10/23/16 37.20 kg/m  10/21/16 37.20 kg/m  10/03/16 36.31 kg/m  09/16/16 36.85 kg/m   Wt Readings from Last 4 Encounters:  10/23/16 210 lb (95.3 kg)  10/21/16 210 lb (95.3 kg)  10/03/16 205 lb (93 kg)  09/16/16 208 lb (94.3 kg)  Psych/Mental status: Alert, oriented x 3 (person, place, & time)       Eyes: PERLA Respiratory: No evidence of acute respiratory distress  Cervical Spine Exam  Inspection: No masses, redness, or swelling Alignment: Symmetrical Functional ROM: Unrestricted ROM Stability: No instability detected Muscle strength & Tone: Functionally intact Sensory: Unimpaired Palpation: Non-contributory  Upper Extremity (UE) Exam    Side: Right upper extremity  Side: Left upper extremity  Inspection: No masses, redness, swelling, or asymmetry  Inspection: No masses, redness, swelling, or asymmetry  Functional ROM: Unrestricted ROM          Functional ROM: Unrestricted ROM          Muscle strength & Tone: Functionally intact  Muscle strength & Tone: Functionally intact  Sensory: Unimpaired  Sensory: Unimpaired  Palpation: Non-contributory  Palpation:  Non-contributory   Thoracic Spine Exam  Inspection: No masses, redness, or swelling Alignment: Symmetrical Functional ROM: Unrestricted ROM Stability: No instability detected Sensory: Unimpaired Muscle strength & Tone: Functionally intact Palpation: Non-contributory  Lumbar Spine Exam  Inspection: No masses, redness, or swelling Alignment: Symmetrical Functional ROM: Unrestricted ROM Stability: No instability detected Muscle strength & Tone: Functionally intact Sensory: Unimpaired Palpation: Non-contributory Provocative Tests: Lumbar Hyperextension and rotation test: evaluation deferred today       Patrick's Maneuver: evaluation deferred today              Gait & Posture Assessment  Ambulation: Unassisted Gait: Relatively normal for age and body habitus Posture: WNL   Lower Extremity Exam    Side: Right lower extremity  Side: Left lower extremity  Inspection: No masses, redness, swelling, or asymmetry  Inspection: No masses, redness, swelling, or asymmetry  Functional ROM: Unrestricted ROM          Functional ROM: Unrestricted ROM          Muscle strength & Tone: Functionally intact  Muscle strength & Tone: Functionally intact  Sensory: Unimpaired  Sensory: Unimpaired  Palpation: Non-contributory  Palpation: Non-contributory   Assessment  Primary Diagnosis & Pertinent Problem List: The primary encounter diagnosis was Chronic cervical radicular pain (Bilateral) (L>R). Diagnoses of Chronic pain syndrome, Cervical spondylosis with myelopathy and radiculopathy, Chronic neck pain, Chronic occipital neuralgia (Location of Tertiary source of pain) (Bilateral) (L>R), Cervico-occipital neuralgia, Chronic upper back pain (Location of Secondary source of  pain) (Bilateral) (L>R), and Chronic pain of both upper extremities were also pertinent to this visit.  Status Diagnosis  Controlled Controlled Controlled 1. Chronic cervical radicular pain (Bilateral) (L>R)   2. Chronic pain  syndrome   3. Cervical spondylosis with myelopathy and radiculopathy   4. Chronic neck pain   5. Chronic occipital neuralgia (Location of Tertiary source of pain) (Bilateral) (L>R)   6. Cervico-occipital neuralgia   7. Chronic upper back pain (Location of Secondary source of pain) (Bilateral) (L>R)   8. Chronic pain of both upper extremities      Plan of Care  Pharmacotherapy (Medications Ordered): No orders of the defined types were placed in this encounter.  New Prescriptions   No medications on file   Medications administered today: Ms. Reimann had no medications administered during this visit. Lab-work, procedure(s), and/or referral(s): Orders Placed This Encounter  Procedures  . Cervical Epidural Injection   Imaging and/or referral(s): None  Interventional therapies: Planned, scheduled, and/or pending:   Therapeutic left cervical epidural steroid injection #2 under fluoroscopic guidance and IV sedation    Considering:   Palliative left L4-5 lumbar epidural steroid injections under fluoroscopic guidance. Diagnostic bilateral lumbar facet block under fluoroscopic guidance and IV sedation.  Possible bilateral lumbar facet radiofrequency ablation. Diagnostic thoracic facet block under fluoroscopic guidance and IV sedation.  Possible bilateral thoracic facet radiofrequency ablation. Left cervical epidural steroid injections under fluoroscopic guidance and IV sedation.  Diagnostic bilateral cervical facet blocks under fluoroscopic guidance and IV sedation.  Possible bilateral cervical facet radiofrequency ablation. Diagnostic bilateral lesser occipital nerve blocks under fluoroscopic guidance and IV sedation.  Diagnostic bilateral C2 and TON nerve block under fluoroscopic guidance and IV sedation. Possible bilateral occipital nerve radiofrequency ablation. Diagnostic right T10-11 thoracic epidural steroid injection under fluoroscopic guidance, with or without sedation.   Diagnostic bilateral suprascapular nerve block under fluoroscopic guidance, with or without sedation.  Diagnostic bilateral intra-articular knee joint injection, without fluoroscopy or IV sedation. Possible bilateral intra-articular Hyalgan knee injections.  Diagnostic bilateral Genicular nerve block under fluoroscopic guidance and IV sedation.  Possible bilateral Genicular nerve radiofrequency ablation.    Palliative PRN treatment(s):   Palliative left L4-5 lumbar epidural steroid injections under fluoroscopic guidance. Diagnostic bilateral lumbar facet block under fluoroscopic guidance and IV sedation.  Diagnostic thoracic facet block under fluoroscopic guidance and IV sedation.  Left cervical epidural steroid injections under fluoroscopic guidance and IV sedation.  Diagnostic bilateral cervical facet blocks under fluoroscopic guidance and IV sedation.  Diagnostic bilateral lesser occipital nerve blocks under fluoroscopic guidance and IV sedation.  Diagnostic bilateral C2 and TON nerve block under fluoroscopic guidance and IV sedation. Diagnostic right T10-11 thoracic epidural steroid injection under fluoroscopic guidance, with or without sedation.  Diagnostic bilateral suprascapular nerve block under fluoroscopic guidance, with or without sedation.  Diagnostic bilateral intra-articular knee joint injection, without fluoroscopy or IV sedation.  Diagnostic bilateral Genicular nerve block under fluoroscopic guidance and IV sedation.    Provider-requested follow-up: Return for procedure (ASAA).  Future Appointments Date Time Provider [place]  10/24/2016 8:15 AM Trula Slade, DPM [facility]  11/27/2016 9:40 AM Arnetha Courser, MD [place] None   Primary Care Physician: Arnetha Courser, MD Location: [facility] Out[facility] Management [facility] Note by: Kathlen Brunswick Dossie Arbour, M.D, DABA, DABAPM, DABPM, DABIPP, FIPP Date: 10/23/2016; Time: 2:23 PM  Pain Score  Disclaimer: We use the NRS-11 scale. This is a self-reported, subjective measurement of pain severity with only modest accuracy. It is used primarily to  identify changes within a particular patient. It must be understood that outpatient pain scales are significantly less accurate that those used for research, where they can be applied under ideal controlled circumstances with minimal exposure to variables. In reality, the score is likely to be a combination of pain intensity and pain affect, where pain affect describes the degree of emotional arousal or changes in action readiness caused by the sensory experience of pain. Factors such as social and work situation, setting, emotional state, anxiety levels, expectation, and prior pain experience may influence pain perception and show large inter-individual differences that may also be affected by time variables.  Patient instructions provided during this appointment: Patient Instructions   Epidural Steroid Injection Patient Information  Description: The epidural space surrounds the nerves as they exit the spinal cord.  In some patients, the nerves can be compressed and inflamed by a bulging disc or a tight spinal canal (spinal stenosis).  By injecting steroids into the epidural space, we can bring irritated nerves into direct contact with a potentially helpful medication.  These steroids act directly on the irritated nerves and can reduce swelling and inflammation which often leads to decreased pain.  Epidural steroids may be injected anywhere along the spine and from the neck to the low back depending upon the location of your pain.   After numbing the skin with local anesthetic (like Novocaine), a small needle is passed into the epidural space slowly.  You may experience a sensation of pressure while this is being done.  The entire block usually last less than 10 minutes.  Conditions which may be treated by epidural steroids:   Low back and leg  pain  Neck and arm pain  Spinal stenosis  Post-laminectomy syndrome  Herpes zoster (shingles) pain  Pain from compression fractures  Preparation for the injection:  1. Do not eat any solid food or dairy products within 8 hours of your appointment.  2. You may drink clear liquids up to 3 hours before appointment.  Clear liquids include water, black coffee, juice or soda.  No milk or cream please. 3. You may take your regular medication, including pain medications, with a sip of water before your appointment  Diabetics should hold regular insulin (if taken separately) and take 1/2 normal NPH dos the morning of the procedure.  Carry some sugar containing items with you to your appointment. 4. A driver must accompany you and be prepared to drive you home after your procedure.  5. Bring all your current medications with your. 6. An IV may be inserted and sedation may be given at the discretion of the physician.   7. A blood pressure cuff, EKG and other monitors will often be applied during the procedure.  Some patients may need to have extra oxygen administered for a short period. 8. You will be asked to provide medical information, including your allergies, prior to the procedure.  We must know immediately if you are taking blood thinners (like Coumadin/Warfarin)  Or if you are allergic to IV iodine contrast (dye). We must know if you could possible be pregnant.  Possible side-effects:  Bleeding from needle site  Infection (rare, may require surgery)  Nerve injury (rare)  Numbness & tingling (temporary)  Difficulty urinating (rare, temporary)  Spinal headache ( a headache worse with upright posture)  Light -headedness (temporary)  Pain at injection site (several days)  Decreased blood pressure (temporary)  Weakness in arm/leg (temporary)  Pressure sensation in back/neck (temporary)  Call if  you experience:  Fever/chills associated with headache or increased back/neck  pain.  Headache worsened by an upright position.  New onset weakness or numbness of an extremity below the injection site  Hives or difficulty breathing (go to the emergency room)  Inflammation or drainage at the infection site  Severe back/neck pain  Any new symptoms which are concerning to you  Please note:  Although the local anesthetic injected can often make your back or neck feel good for several hours after the injection, the pain will likely return.  It takes 3-7 days for steroids to work in the epidural space.  You may not notice any pain relief for at least that one week.  If effective, we will often do a series of three injections spaced 3-6 weeks apart to maximally decrease your pain.  After the initial series, we generally will wait several months before considering a repeat injection of the same type.  If you have any questions, please call [phone] [place]  What are the risk, side effects and possible complications? Generally speaking, most procedures are safe.  However, with any procedure there are risks, side effects, and the possibility of complications.  The risks and complications are dependent upon the sites that are lesioned, or the type of nerve block to be performed.  The closer the procedure is to the spine, the more serious the risks are.  Great care is taken when placing the radio frequency needles, block needles or lesioning probes, but sometimes complications can occur. 1. Infection: Any time there is an injection through the skin, there is a risk of infection.  This is why sterile conditions are used for these blocks.  There are four possible types of infection. 1. Localized skin infection. 2. Central Nervous System Infection-This can be in the form of Meningitis, which can be deadly. 3. Epidural Infections-This can be in the form of an epidural abscess, which can cause pressure inside  of the spine, causing compression of the spinal cord with subsequent paralysis. This would require an emergency surgery to decompress, and there are no guarantees that the patient would recover from the paralysis. 4. Discitis-This is an infection of the intervertebral discs.  It occurs in about 1% of discography procedures.  It is difficult to treat and it may lead to surgery.        2. Pain: the needles have to go through skin and soft tissues, will cause soreness.       3. Damage to internal structures:  The nerves to be lesioned may be near blood vessels or    other nerves which can be potentially damaged.       4. Bleeding: Bleeding is more common if the patient is taking blood thinners such as  aspirin, Coumadin, Ticiid, Plavix, etc., or if he/she have some genetic predisposition  such as hemophilia. Bleeding into the spinal canal can cause compression of the spinal  cord with subsequent paralysis.  This would require an emergency surgery to  decompress and there are no guarantees that the patient would recover from the  paralysis.       5. Pneumothorax:  Puncturing of a lung is a possibility, every time a needle is introduced in  the area of the chest or upper back.  Pneumothorax refers to free air around the  collapsed lung(s), inside of the thoracic cavity (chest cavity).  Another two possible  complications related to a similar event would  include: Hemothorax and Chylothorax.   These are variations of the Pneumothorax, where instead of air around the collapsed  lung(s), you may have blood or chyle, respectively.       6. Spinal headaches: They may occur with any procedures in the area of the spine.       7. Persistent CSF (Cerebro-Spinal Fluid) leakage: This is a rare problem, but may occur  with prolonged intrathecal or epidural catheters either due to the formation of a fistulous  track or a dural tear.       8. Nerve damage: By working so close to the spinal cord, there is always a possibility  of  nerve damage, which could be as serious as a permanent spinal cord injury with  paralysis.       9. Death:  Although rare, severe deadly allergic reactions known as "Anaphylactic  reaction" can occur to any of the medications used.      10. Worsening of the symptoms:  We can always make thing worse.  What are the chances of something like this happening? Chances of any of this occuring are extremely low.  By statistics, you have more of a chance of getting killed in a motor vehicle accident: while driving to the hospital than any of the above occurring .  Nevertheless, you should be aware that they are possibilities.  In general, it is similar to taking a shower.  Everybody knows that you can slip, hit your head and get killed.  Does that mean that you should not shower again?  Nevertheless always keep in mind that statistics do not mean anything if you happen to be on the wrong side of them.  Even if a procedure has a 1 (one) in a 1,000,000 (million) chance of going wrong, it you happen to be that one..Also, keep in mind that by statistics, you have more of a chance of having something go wrong when taking medications.  Who should not have this procedure? If you are on a blood thinning medication (e.g. Coumadin, Plavix, see list of "Blood Thinners"), or if you have an active infection going on, you should not have the procedure.  If you are taking any blood thinners, please inform your physician.  How should I prepare for this procedure?  Do not eat or drink anything at least six hours prior to the procedure.  Bring a driver with you .  It cannot be a taxi.  Come accompanied by an adult that can drive you back, and that is strong enough to help you if your legs get weak or numb from the local anesthetic.  Take all of your medicines the morning of the procedure with just enough water to swallow them.  If you have diabetes, make sure that you are scheduled to have your procedure done first  thing in the morning, whenever possible.  If you have diabetes, take only half of your insulin dose and notify our nurse that you have done so as soon as you arrive at the clinic.  If you are diabetic, but only take blood sugar pills (oral hypoglycemic), then do not take them on the morning of your procedure.  You may take them after you have had the procedure.  Do not take aspirin or any aspirin-containing medications, at least eleven (11) days prior to the procedure.  They may prolong bleeding.  Wear loose fitting clothing that may be easy to take off and that you would not mind if it got  stained with Betadine or blood.  Do not wear any jewelry or perfume  Remove any nail coloring.  It will interfere with some of our monitoring equipment.  NOTE: Remember that this is not meant to be interpreted as a complete list of all possible complications.  Unforeseen problems may occur.  BLOOD THINNERS The following drugs contain aspirin or other products, which can cause increased bleeding during surgery and should not be taken for 2 weeks prior to and 1 week after surgery.  If you should need take something for relief of minor pain, you may take acetaminophen which is found in Tylenol,m Datril, Anacin-3 and Panadol. It is not blood thinner. The products listed below are.  Do not take any of the products listed below in addition to any listed on your instruction sheet.  A.P.C or A.P.C with Codeine Codeine Phosphate Capsules #3 Ibuprofen Ridaura  ABC compound Congesprin Imuran rimadil  Advil Cope Indocin Robaxisal  Alka-Seltzer Effervescent Pain Reliever and Antacid Coricidin or Coricidin-D  Indomethacin Rufen  Alka-Seltzer plus Cold Medicine Cosprin Ketoprofen S-A-C Tablets  Anacin Analgesic Tablets or Capsules Coumadin Korlgesic Salflex  Anacin Extra Strength Analgesic tablets or capsules CP-2 Tablets Lanoril Salicylate  Anaprox Cuprimine Capsules Levenox Salocol  Anexsia-D Dalteparin Magan  Salsalate  Anodynos Darvon compound Magnesium Salicylate Sine-off  Ansaid Dasin Capsules Magsal Sodium Salicylate  Anturane Depen Capsules Marnal Soma  APF Arthritis pain formula Dewitt's Pills Measurin Stanback  Argesic Dia-Gesic Meclofenamic Sulfinpyrazone  Arthritis Bayer Timed Release Aspirin Diclofenac Meclomen Sulindac  Arthritis pain formula Anacin Dicumarol Medipren Supac  Analgesic (Safety coated) Arthralgen Diffunasal Mefanamic Suprofen  Arthritis Strength Bufferin Dihydrocodeine Mepro Compound Suprol  Arthropan liquid Dopirydamole Methcarbomol with Aspirin Synalgos  ASA tablets/Enseals Disalcid Micrainin Tagament  Ascriptin Doan's Midol Talwin  Ascriptin A/D Dolene Mobidin Tanderil  Ascriptin Extra Strength Dolobid Moblgesic Ticlid  Ascriptin with Codeine Doloprin or Doloprin with Codeine Momentum Tolectin  Asperbuf Duoprin Mono-gesic Trendar  Aspergum Duradyne Motrin or Motrin IB Triminicin  Aspirin plain, buffered or enteric coated Durasal Myochrisine Trigesic  Aspirin Suppositories Easprin Nalfon Trillsate  Aspirin with Codeine Ecotrin Regular or Extra Strength Naprosyn Uracel  Atromid-S Efficin Naproxen Ursinus  Auranofin Capsules Elmiron Neocylate Vanquish  Axotal Emagrin Norgesic Verin  Azathioprine Empirin or Empirin with Codeine Normiflo Vitamin E  Azolid Emprazil Nuprin Voltaren  Bayer Aspirin plain, buffered or children's or timed BC Tablets or powders Encaprin Orgaran Warfarin Sodium  Buff-a-Comp Enoxaparin Orudis Zorpin  Buff-a-Comp with Codeine Equegesic Os-Cal-Gesic   Buffaprin Excedrin plain, buffered or Extra Strength Oxalid   Bufferin Arthritis Strength Feldene Oxphenbutazone   Bufferin plain or Extra Strength Feldene Capsules Oxycodone with Aspirin   Bufferin with Codeine Fenoprofen Fenoprofen Pabalate or Pabalate-SF   Buffets II Flogesic Panagesic   Buffinol plain or Extra Strength Florinal or Florinal with Codeine Panwarfarin   Buf-Tabs Flurbiprofen  Penicillamine   Butalbital Compound Four-way cold tablets Penicillin   Butazolidin Fragmin Pepto-Bismol   Carbenicillin Geminisyn Percodan   Carna Arthritis Reliever Geopen Persantine   Carprofen Gold's salt Persistin   Chloramphenicol Goody's Phenylbutazone   Chloromycetin Haltrain Piroxlcam   Clmetidine heparin Plaquenil   Cllnoril Hyco-pap Ponstel   Clofibrate Hydroxy chloroquine Propoxyphen         Before stopping any of these medications, be sure to consult the physician who ordered them.  Some, such as Coumadin (Warfarin) are ordered to prevent or treat serious conditions such as "deep thrombosis", "pumonary embolisms", and other heart problems.  The amount of time that  you may need off of the medication may also vary with the medication and the reason for which you were taking it.  If you are taking any of these medications, please make sure you notify your pain physician before you undergo any procedures.

## 2016-10-24 ENCOUNTER — Ambulatory Visit (INDEPENDENT_AMBULATORY_CARE_PROVIDER_SITE_OTHER): Payer: BLUE CROSS/BLUE SHIELD | Admitting: Podiatry

## 2016-10-24 DIAGNOSIS — M779 Enthesopathy, unspecified: Secondary | ICD-10-CM

## 2016-10-24 DIAGNOSIS — M722 Plantar fascial fibromatosis: Secondary | ICD-10-CM

## 2016-10-29 NOTE — Progress Notes (Signed)
Subjective: 46 year old female presents the office today for two-week follow-up evaluation of left plantar fasciitis. She states that the pain is doing better although she still gets some pain. She states that she is getting more ankle pain in front of her ankle. This been ongoing for quite some time she has difficulty wearing shoes as it rubs causing swelling to the front of her ankle. She denies any recent injury or trauma. Denies any swelling or redness. Denies any systemic complaints such as fevers, chills, nausea, vomiting. No acute changes since last appointment, and no other complaints at this time.   Objective: AAO x3, NAD DP/PT pulses palpable bilaterally, CRT less than 3 seconds There is improved yet mild continued tenderness to palpation along the plantar medial tubercle of the calcaneus at the insertion of plantar fascia on the left foot. There is no pain along the course of the plantar fascia within the arch of the foot. Plantar fascia appears to be intact. There is no pain with lateral compression of the calcaneus or pain with vibratory sensation. There is no pain along the course or insertion of the achilles tendon. There appears to be mild discomfort along the tibialis anterior and extensor tendons and the anterior aspect of the ankle on the left side. There is a decrease in medial arch height upon weightbearing.No other areas of tenderness to bilateral lower extremities. No open lesions or pre-ulcerative lesions.  No pain with calf compression, swelling, warmth, erythema  Assessment: Resolving left heel pain, plantar fasciitis with tendinitis likely due to biomechanical changes  Plan: -All treatment options discussed with the patient including all alternatives, risks, complications.  -I discussed repeat steroid injection however she states the heels doing better and she was told off on this today. -I would like she is getting the plantar fasciitis as well as tendinitis of the  anterior ankle to to her foot structure. I discussed their custom orthotics as well as shoe gear changes. She was to hold off on inserts today. I did make her a binder for her arch. She states that she cannot wear any kind of bracing is on the anterior aspect of the ankle to the for an arch binder may be more beneficial for her. Also discussed that she's not into custom answered to start an over-the-counter insert. Continue stretching and icing daily as well.  -Patient encouraged to call the office with any questions, concerns, change in symptoms.   Celesta Gentile, DPM

## 2016-10-30 ENCOUNTER — Encounter: Payer: Self-pay | Admitting: Pain Medicine

## 2016-10-30 ENCOUNTER — Ambulatory Visit
Admission: RE | Admit: 2016-10-30 | Discharge: 2016-10-30 | Disposition: A | Discharge status: Home / Self Care | Payer: BLUE CROSS/BLUE SHIELD | Source: Ambulatory Visit | Attending: Pain Medicine | Admitting: Pain Medicine

## 2016-10-30 ENCOUNTER — Ambulatory Visit (HOSPITAL_BASED_OUTPATIENT_CLINIC_OR_DEPARTMENT_OTHER): Payer: BLUE CROSS/BLUE SHIELD | Admitting: Pain Medicine

## 2016-10-30 VITALS — BP 109/68 | HR 66 | Temp 98.8°F | Resp 16 | Ht 63.0 in | Wt 208.0 lb

## 2016-10-30 DIAGNOSIS — Z882 Allergy status to sulfonamides status: Secondary | ICD-10-CM | POA: Insufficient documentation

## 2016-10-30 DIAGNOSIS — M4712 Other spondylosis with myelopathy, cervical region: Secondary | ICD-10-CM | POA: Diagnosis not present

## 2016-10-30 DIAGNOSIS — R55 Syncope and collapse: Secondary | ICD-10-CM | POA: Insufficient documentation

## 2016-10-30 DIAGNOSIS — Z91040 Latex allergy status: Secondary | ICD-10-CM | POA: Insufficient documentation

## 2016-10-30 DIAGNOSIS — Z91041 Radiographic dye allergy status: Secondary | ICD-10-CM | POA: Insufficient documentation

## 2016-10-30 DIAGNOSIS — M4722 Other spondylosis with radiculopathy, cervical region: Secondary | ICD-10-CM | POA: Diagnosis not present

## 2016-10-30 DIAGNOSIS — M5412 Radiculopathy, cervical region: Secondary | ICD-10-CM

## 2016-10-30 DIAGNOSIS — G8929 Other chronic pain: Secondary | ICD-10-CM

## 2016-10-30 DIAGNOSIS — M542 Cervicalgia: Secondary | ICD-10-CM

## 2016-10-30 DIAGNOSIS — Z880 Allergy status to penicillin: Secondary | ICD-10-CM | POA: Diagnosis not present

## 2016-10-30 DIAGNOSIS — Z885 Allergy status to narcotic agent status: Secondary | ICD-10-CM | POA: Diagnosis not present

## 2016-10-30 DIAGNOSIS — Z888 Allergy status to other drugs, medicaments and biological substances status: Secondary | ICD-10-CM | POA: Diagnosis not present

## 2016-10-30 MED ORDER — LIDOCAINE HCL (PF) 1 % IJ SOLN: 10.0000 mL | Freq: Once | INTRAMUSCULAR | Status: DC

## 2016-10-30 MED ORDER — GLYCOPYRROLATE 0.2 MG/ML IJ SOLN
INTRAMUSCULAR | Status: AC
  Administered 2016-10-30: 0.2 mg via INTRAMUSCULAR
  Filled 2016-10-30: qty 1

## 2016-10-30 MED ORDER — DEXAMETHASONE SODIUM PHOSPHATE 10 MG/ML IJ SOLN
10.0000 mg | Freq: Once | INTRAMUSCULAR | Status: AC
  Administered 2016-10-30: 10 mg
  Filled 2016-10-30: qty 1

## 2016-10-30 MED ORDER — SODIUM CHLORIDE 0.9% FLUSH
1.0000 mL | Freq: Once | INTRAVENOUS | Status: AC
  Administered 2016-10-30: 1 mL

## 2016-10-30 MED ORDER — SODIUM CHLORIDE 0.9 % IJ SOLN
INTRAMUSCULAR | Status: AC
  Filled 2016-10-30: qty 10

## 2016-10-30 MED ORDER — DEXAMETHASONE SODIUM PHOSPHATE 4 MG/ML IJ SOLN
INTRAMUSCULAR | Status: AC
  Filled 2016-10-30: qty 3

## 2016-10-30 MED ORDER — ROPIVACAINE HCL 2 MG/ML IJ SOLN
INTRAMUSCULAR | Status: AC
  Filled 2016-10-30: qty 10

## 2016-10-30 MED ORDER — ROPIVACAINE HCL 2 MG/ML IJ SOLN
1.0000 mL | Freq: Once | INTRAMUSCULAR | Status: AC
  Administered 2016-10-30: 1 mL via EPIDURAL

## 2016-10-30 MED ORDER — GLYCOPYRROLATE 0.2 MG/ML IJ SOLN: 0.2000 mg | Freq: Once | INTRAMUSCULAR | Status: DC

## 2016-10-30 MED ORDER — LIDOCAINE HCL (PF) 1 % IJ SOLN
INTRAMUSCULAR | Status: AC
  Administered 2016-10-30: 10:00:00
  Filled 2016-10-30: qty 10

## 2016-10-30 NOTE — Progress Notes (Signed)
Safety precautions to be maintained throughout the outpatient stay will include: orient to surroundings, keep bed in low position, maintain call bell within reach at all times, provide assistance with transfer out of bed and ambulation.  

## 2016-10-30 NOTE — Progress Notes (Signed)
Patient's Name: Dana Bradley  MRN: [id number]  Referring Provider: Arnetha Courser, MD  DOB: 1971/01/27  PCP: Arnetha Courser, MD  DOS: 10/30/2016  Note by: Kathlen Brunswick. Dossie Arbour, MD  Service setting: Ambulatory outpatient  Location: [facility] (AMB) Pain Management Facility  Visit type: Procedure  Specialty: Interventional Pain Management  Patient type: Established   Primary Reason for Visit: Interventional Pain Management Treatment. CC: No chief complaint on file.  Procedure:  Anesthesia, Analgesia, Anxiolysis:  Type: Therapeutic, Inter-Laminar, Epidural Steroid Injection Region: Posterior Cervico-thoracic Region Level: C7-T1 Laterality: Left-Sided Paramedial  Type: Local Anesthesia Local Anesthetic: Lidocaine 1% Route: Infiltration ([place]/IM) IV Access: Declined Sedation: Declined  Indication(s): Analgesia          Indications: 1. Cervical spondylosis with myelopathy and radiculopathy   2. Chronic cervical radicular pain (Bilateral) (L>R)   3. Chronic neck pain   4. Vasovagal episode    Pain Score: Pre-procedure: 2 /10 Post-procedure: 1 /10  Pre-op Assessment:  Previous date of service: 10/23/16 Service provided: Evaluation Dana Bradley is a 46 y.o. (year old), female patient, seen today for interventional treatment. She  has a past surgical history that includes Ablation; Tubal ligation (10/01/99); Cardiac catheterization; Knee arthroscopy; Colonoscopy with propofol (N/A, 05/17/2015); and Esophagogastroduodenoscopy (N/A, 05/17/2015). Her primarily concern today is the No chief complaint on file.  Initial Vital Signs: Blood pressure 115/65, pulse 81, temperature 98.4 F (36.9 C), resp. rate 16, height 5\' 3"  (1.6 m), weight 208 lb (94.3 kg), last menstrual period 10/03/2016, SpO2 98 %. BMI: 36.85 kg/m  Risk Assessment: Allergies: Reviewed. She is allergic to aspirin; cymbalta [duloxetine hcl]; demerol [meperidine]; depakote [divalproex sodium]; haloperidol; iodinated diagnostic agents; reglan  [metoclopramide]; tramadol hcl; trazodone; compazine [prochlorperazine]; meloxicam; penicillins; tomato; bacitracin-neomycin-polymyxin; cephalosporins; ibuprofen; latex; neosporin [neomycin-bacitracin zn-polymyx]; nsaids; and sulfonamide derivatives. Allergy Precautions: No radiological contrast used Coagulopathies: "Reviewed. None identified.  Blood-thinner therapy: None at this time Active Infection(s): Reviewed. None identified. Dana Bradley is afebrile  Site Confirmation: Dana Bradley was asked to confirm the procedure and laterality before marking the site Procedure checklist: Completed Consent: Before the procedure and under the influence of no sedative(s), amnesic(s), or anxiolytics, the patient was informed of the treatment options, risks and possible complications. To fulfill our ethical and legal obligations, as recommended by the American Medical Association's Code of Ethics, I have informed the patient of my clinical impression; the nature and purpose of the treatment or procedure; the risks, benefits, and possible complications of the intervention; the alternatives, including doing nothing; the risk(s) and benefit(s) of the alternative treatment(s) or procedure(s); and the risk(s) and benefit(s) of doing nothing. The patient was provided information about the general risks and possible complications associated with the procedure. These may include, but are not limited to: failure to achieve desired goals, infection, bleeding, organ or nerve damage, allergic reactions, paralysis, and death. In addition, the patient was informed of those risks and complications associated to Spine-related procedures, such as failure to decrease pain; infection (i.e.: Meningitis, epidural or intraspinal abscess); bleeding (i.e.: epidural hematoma, subarachnoid hemorrhage, or any other type of intraspinal or peri-dural bleeding); organ or nerve damage (i.e.: Any type of peripheral nerve, nerve root, or spinal cord  injury) with subsequent damage to sensory, motor, and/or autonomic systems, resulting in permanent pain, numbness, and/or weakness of one or several areas of the body; allergic reactions; (i.e.: anaphylactic reaction); and/or death. Furthermore, the patient was informed of those risks and complications associated with the medications. These include, but are not limited to: allergic  reactions (i.e.: anaphylactic or anaphylactoid reaction(s)); adrenal axis suppression; blood sugar elevation that in diabetics may result in ketoacidosis or comma; water retention that in patients with history of congestive heart failure may result in shortness of breath, pulmonary edema, and decompensation with resultant heart failure; weight gain; swelling or edema; medication-induced neural toxicity; particulate matter embolism and blood vessel occlusion with resultant organ, and/or nervous system infarction; and/or aseptic necrosis of one or more joints. Finally, the patient was informed that Medicine is not an exact science; therefore, there is also the possibility of unforeseen or unpredictable risks and/or possible complications that may result in a catastrophic outcome. The patient indicated having understood very clearly. We have given the patient no guarantees and we have made no promises. Enough time was given to the patient to ask questions, all of which were answered to the patient's satisfaction. Dana Bradley has indicated that she wanted to continue with the procedure. Attestation: I, the ordering provider, attest that I have discussed with the patient the benefits, risks, side-effects, alternatives, likelihood of achieving goals, and potential problems during recovery for the procedure that I have provided informed consent. Date: 10/30/2016; Time: 9:05 AM  Pre-Procedure Preparation:  Monitoring: As per clinic protocol. Respiration, ETCO2, SpO2, BP, heart rate and rhythm monitor placed and checked for adequate  function Safety Precautions: Patient was assessed for positional comfort and pressure points before starting the procedure. Time-out: I initiated and conducted the "Time-out" before starting the procedure, as per protocol. The patient was asked to participate by confirming the accuracy of the "Time Out" information. Verification of the correct person, site, and procedure were performed and confirmed by me, the nursing staff, and the patient. "Time-out" conducted as per Joint Commission's Universal Protocol (UP.01.01.01). "Time-out" Date & Time: 10/30/2016; 0941 hrs.  Description of Procedure Process:   Position: Prone with head of the table was raised to facilitate breathing. Target Area: For Epidural Steroid injections the target is the interlaminar space, initially targeting the lower border of the superior vertebral body lamina. Approach: Paramedial approach. Area Prepped: Entire PosteriorCervical Region Prepping solution: ChloraPrep (2% chlorhexidine gluconate and 70% isopropyl alcohol) Safety Precautions: Aspiration looking for blood return was conducted prior to all injections. At no point did we inject any substances, as a needle was being advanced. No attempts were made at seeking any paresthesias. Safe injection practices and needle disposal techniques used. Medications properly checked for expiration dates. SDV (single dose vial) medications used. Description of the Procedure: Protocol guidelines were followed. The procedure needle was introduced through the skin, ipsilateral to the reported pain, and advanced to the target area. Bone was contacted and the needle walked caudad, until the lamina was cleared. The epidural space was identified using "loss-of-resistance technique" with 2-3 ml of PF-NaCl (0.9% NSS), in a 5cc LOR glass syringe. Start Time: 0941 hrs. End Time: 0959 hrs. Materials:  Needle(s) Type: Epidural needle Gauge: 17G Length: 3.5-in Medication(s): We administered  dexamethasone, sodium chloride flush, ropivacaine (PF) 2 mg/mL (0.2%), lidocaine (PF), and glycopyrrolate. Please see chart orders for dosing details.  Imaging Guidance (Spinal):  Type of Imaging Technique: Fluoroscopy Guidance (Spinal) Indication(s): Assistance in needle guidance and placement for procedures requiring needle placement in or near specific anatomical locations not easily accessible without such assistance. Exposure Time: Please see nurses notes. Contrast: None used. Fluoroscopic Guidance: I was personally present during the use of fluoroscopy. "Tunnel Vision Technique" used to obtain the best possible view of the target area. Parallax error corrected before commencing the  procedure. "Direction-depth-direction" technique used to introduce the needle under continuous pulsed fluoroscopy. Once target was reached, antero-posterior, oblique, and lateral fluoroscopic projection used confirm needle placement in all planes. Images permanently stored in EMR. Interpretation: No contrast injected. I personally interpreted the imaging intraoperatively. Adequate needle placement confirmed in multiple planes. Permanent images saved into the patient's record.  Antibiotic Prophylaxis:  Indication(s): None identified Antibiotic given: None  Post-operative Assessment:  EBL: None Complications: Intraoperatively, the patient did experience a vasovagal episode which was treated with repositioning to attend in the lumbar position and administering 0.2 mg of glycopyrrolate IM. The episode corrected without any problems and the patient was informed about it. I took the time to also explained to the patient what a vasovagal reaction was. Note: The patient tolerated the entire procedure well. A repeat set of vitals were taken after the procedure and the patient was kept under observation following institutional policy, for this type of procedure. Post-procedural neurological assessment was performed, showing  return to baseline, prior to discharge. The patient was provided with post-procedure discharge instructions, including a section on how to identify potential problems. Should any problems arise concerning this procedure, the patient was given instructions to immediately contact us, at any time, without hesitation. In any case, we plan to contact the patient by telephone for a follow-up status report regarding this interventional procedure. Comments:  No additional relevant information.  Plan of Care  Disposition: Discharge home  Discharge Date & Time: 10/30/2016; 1026 hrs.  Physician-requested Follow-up:  Return in about 2 weeks (around 11/13/2016) for Post-Procedure evaluation.  Future Appointments Date Time Provider [place]  11/20/2016 9:30 AM Max T [place], [place] [facility]  11/27/2016 9:40 AM Arnetha Courser, MD [place] None  12/04/2016 1:30 PM Milinda Pointer, MD [facility] None   Medications ordered for procedure: Meds ordered this encounter  Medications  . dexamethasone (DECADRON) injection 10 mg  . lidocaine (PF) (XYLOCAINE) 1 % injection 10 mL  . sodium chloride flush (NS) 0.9 % injection 1 mL  . ropivacaine (PF) 2 mg/mL (0.2%) (NAROPIN) injection 1 mL  . lidocaine (PF) (XYLOCAINE) 1 % injection    Florene Glen, Patti: cabinet override  . sodium chloride 0.9 % injection    Fl[place]ne [place], Patti: cabinet override  . ropivacaine (PF) 2 mg/mL (0.2%) (NAROPIN) 2 MG/ML injection    [place]ne [place], Patti: cabinet override  . dexamethasone (DECADRON) 4 MG/ML injection    [place]ne [place], Patti: cabinet override  . glycopyrrolate (ROBINUL) 0.2 MG/ML injection    Fl[place]ne [place], Patti: cabinet override  . glycopyrrolate (ROBINUL) injection 0.2 mg   Medications administered: We administered dexamethasone, sodium chloride flush, ropivacaine (PF) 2 mg/mL (0.2%), lidocaine (PF), and glycopyrrolate.  See the medical record for exact dosing, route, and time of administration.  Lab-work, Procedure(s), &  Referral(s) Ordered: Orders Placed This Encounter  Procedures  . Cervical Epidural Injection  . DG C-Arm 1-60 Min-No Report  . Discharge instructions  . Follow-up  . Informed Consent Details: Transcribe to consent form and obtain patient signature  . Provider attestation of informed consent for procedure/surgical case  . Verify informed consent   Imaging Ordered: Results for orders placed in visit on 09/11/16  DG C-Arm 1-60 Min-No Report   Narrative There is no Radiologist interpretation  for this exam.   New Prescriptions   No medications on file   Primary Care Physician: Arnetha Courser, MD Location: [facility] Note by: Kathlen Brunswick. Dossie Arbour, M.D, DABA, DABAPM, DABPM, DABIPP, FIPP Date: 10/30/2016; Time:  1:06 PM  Disclaimer:  Medicine is not an Chief Strategy Officer. The only guarantee in medicine is that nothing is guaranteed. It is important to note that the decision to proceed with this intervention was based on the information collected from the patient. The Data and conclusions were drawn from the patient's questionnaire, the interview, and the physical examination. Because the information was provided in large part by the patient, it cannot be guaranteed that it has not been purposely or unconsciously manipulated. Every effort has been made to obtain as much relevant data as possible for this evaluation. It is important to note that the conclusions that lead to this procedure are derived in large part from the available data. Always take into account that the treatment will also be dependent on availability of resources and existing treatment guidelines, considered by other Pain Management Practitioners as being common knowledge and practice, at the time of the intervention. For Medico-Legal purposes, it is also important to point out that variation in procedural techniques and pharmacological choices are the acceptable norm. The indications, contraindications,  technique, and results of the above procedure should only be interpreted and judged by a Board-Certified Interventional Pain Specialist with extensive familiarity and expertise in the same exact procedure and technique. Attempts at providing opinions without similar or greater experience and expertise than that of the treating physician will be considered as inappropriate and unethical, and shall result in a formal complaint to the state medical board and applicable specialty societies.  Instructions provided at this appointment: Patient Instructions  Pain Management Discharge Instructions  General Discharge Instructions :  If you need to reach your doctor call: Monday-Friday 8:00 am - 4:00 pm at [phone] or toll free [phone].  After clinic hours [phone] to have operator reach doctor.  Bring all of your medication bottles to all your appointments in the pain clinic.  To cancel or reschedule your appointment with Pain Management please remember to call 24 hours in advance to avoid a fee.  Refer to the educational materials which you have been given on: General Risks, I had my Procedure. Discharge Instructions, Post Sedation.  Post Procedure Instructions:  The drugs you were given will stay in your system until tomorrow, so for the next 24 hours you should not drive, make any legal decisions or drink any alcoholic beverages.  You may eat anything you prefer, but it is better to start with liquids then soups and crackers, and gradually work up to solid foods.  Please notify your doctor immediately if you have any unusual bleeding, trouble breathing or pain that is not related to your normal pain.  Depending on the type of procedure that was done, some parts of your body may feel week and/or numb.  This usually clears up by tonight or the next day.  Walk with the use of an assistive device or accompanied by an adult for the 24 hours.  You may use ice on the affected area for  the first 24 hours.  Put ice in a Ziploc bag and cover with a towel and place against area 15 minutes on 15 minutes off.  You may switch to heat after 24 hours.Epidural Steroid Injection Patient Information  Description: The epidural space surrounds the nerves as they exit the spinal cord.  In some patients, the nerves can be compressed and inflamed by a bulging disc or a tight spinal canal (spinal stenosis).  By injecting steroids into the epidural space, we can bring irritated nerves into direct contact  with a potentially helpful medication.  These steroids act directly on the irritated nerves and can reduce swelling and inflammation which often leads to decreased pain.  Epidural steroids may be injected anywhere along the spine and from the neck to the low back depending upon the location of your pain.   After numbing the skin with local anesthetic (like Novocaine), a small needle is passed into the epidural space slowly.  You may experience a sensation of pressure while this is being done.  The entire block usually last less than 10 minutes.  Conditions which may be treated by epidural steroids:   Low back and leg pain  Neck and arm pain  Spinal stenosis  Post-laminectomy syndrome  Herpes zoster (shingles) pain  Pain from compression fractures  Preparation for the injection:  1. Do not eat any solid food or dairy products within 8 hours of your appointment.  2. You may drink clear liquids up to 3 hours before appointment.  Clear liquids include water, black coffee, juice or soda.  No milk or cream please. 3. You may take your regular medication, including pain medications, with a sip of water before your appointment  Diabetics should hold regular insulin (if taken separately) and take 1/2 normal NPH dos the morning of the procedure.  Carry some sugar containing items with you to your appointment. 4. A driver must accompany you and be prepared to drive you home after your procedure.   5. Bring all your current medications with your. 6. An IV may be inserted and sedation may be given at the discretion of the physician.   7. A blood pressure cuff, EKG and other monitors will often be applied during the procedure.  Some patients may need to have extra oxygen administered for a short period. 8. You will be asked to provide medical information, including your allergies, prior to the procedure.  We must know immediately if you are taking blood thinners (like Coumadin/Warfarin)  Or if you are allergic to IV iodine contrast (dye). We must know if you could possible be pregnant.  Possible side-effects:  Bleeding from needle site  Infection (rare, may require surgery)  Nerve injury (rare)  Numbness & tingling (temporary)  Difficulty urinating (rare, temporary)  Spinal headache ( a headache worse with upright posture)  Light -headedness (temporary)  Pain at injection site (several days)  Decreased blood pressure (temporary)  Weakness in arm/leg (temporary)  Pressure sensation in back/neck (temporary)  Call if you experience:  Fever/chills associated with headache or increased back/neck pain.  Headache worsened by an upright position.  New onset weakness or numbness of an extremity below the injection site  Hives or difficulty breathing (go to the emergency room)  Inflammation or drainage at the infection site  Severe back/neck pain  Any new symptoms which are concerning to you  Please note:  Although the local anesthetic injected can often make your back or neck feel good for several hours after the injection, the pain will likely return.  It takes 3-7 days for steroids to work in the epidural space.  You may not notice any pain relief for at least that one week.  If effective, we will often do a series of three injections spaced 3-6 weeks apart to maximally decrease your pain.  After the initial series, we generally will wait several months before  considering a repeat injection of the same type.  If you have any questions, please call [phone] [facility]

## 2016-10-30 NOTE — Patient Instructions (Signed)
Pain Management Discharge Instructions  General Discharge Instructions :  If you need to reach your doctor call: Monday-Friday 8:00 am - 4:00 pm at [phone] or toll free [phone].  After clinic hours [phone] to have operator reach doctor.  Bring all of your medication bottles to all your appointments in the pain clinic.  To cancel or reschedule your appointment with Pain Management please remember to call 24 hours in advance to avoid a fee.  Refer to the educational materials which you have been given on: General Risks, I had my Procedure. Discharge Instructions, Post Sedation.  Post Procedure Instructions:  The drugs you were given will stay in your system until tomorrow, so for the next 24 hours you should not drive, make any legal decisions or drink any alcoholic beverages.  You may eat anything you prefer, but it is better to start with liquids then soups and crackers, and gradually work up to solid foods.  Please notify your doctor immediately if you have any unusual bleeding, trouble breathing or pain that is not related to your normal pain.  Depending on the type of procedure that was done, some parts of your body may feel week and/or numb.  This usually clears up by tonight or the next day.  Walk with the use of an assistive device or accompanied by an adult for the 24 hours.  You may use ice on the affected area for the first 24 hours.  Put ice in a Ziploc bag and cover with a towel and place against area 15 minutes on 15 minutes off.  You may switch to heat after 24 hours.Epidural Steroid Injection Patient Information  Description: The epidural space surrounds the nerves as they exit the spinal cord.  In some patients, the nerves can be compressed and inflamed by a bulging disc or a tight spinal canal (spinal stenosis).  By injecting steroids into the epidural space, we can bring irritated nerves into direct contact with a potentially helpful medication.  These  steroids act directly on the irritated nerves and can reduce swelling and inflammation which often leads to decreased pain.  Epidural steroids may be injected anywhere along the spine and from the neck to the low back depending upon the location of your pain.   After numbing the skin with local anesthetic (like Novocaine), a small needle is passed into the epidural space slowly.  You may experience a sensation of pressure while this is being done.  The entire block usually last less than 10 minutes.  Conditions which may be treated by epidural steroids:   Low back and leg pain  Neck and arm pain  Spinal stenosis  Post-laminectomy syndrome  Herpes zoster (shingles) pain  Pain from compression fractures  Preparation for the injection:  1. Do not eat any solid food or dairy products within 8 hours of your appointment.  2. You may drink clear liquids up to 3 hours before appointment.  Clear liquids include water, black coffee, juice or soda.  No milk or cream please. 3. You may take your regular medication, including pain medications, with a sip of water before your appointment  Diabetics should hold regular insulin (if taken separately) and take 1/2 normal NPH dos the morning of the procedure.  Carry some sugar containing items with you to your appointment. 4. A driver must accompany you and be prepared to drive you home after your procedure.  5. Bring all your current medications with your. 6. An IV may be inserted and   sedation may be given at the discretion of the physician.   7. A blood pressure cuff, EKG and other monitors will often be applied during the procedure.  Some patients may need to have extra oxygen administered for a short period. 8. You will be asked to provide medical information, including your allergies, prior to the procedure.  We must know immediately if you are taking blood thinners (like Coumadin/Warfarin)  Or if you are allergic to IV iodine contrast (dye). We must  know if you could possible be pregnant.  Possible side-effects:  Bleeding from needle site  Infection (rare, may require surgery)  Nerve injury (rare)  Numbness & tingling (temporary)  Difficulty urinating (rare, temporary)  Spinal headache ( a headache worse with upright posture)  Light -headedness (temporary)  Pain at injection site (several days)  Decreased blood pressure (temporary)  Weakness in arm/leg (temporary)  Pressure sensation in back/neck (temporary)  Call if you experience:  Fever/chills associated with headache or increased back/neck pain.  Headache worsened by an upright position.  New onset weakness or numbness of an extremity below the injection site  Hives or difficulty breathing (go to the emergency room)  Inflammation or drainage at the infection site  Severe back/neck pain  Any new symptoms which are concerning to you  Please note:  Although the local anesthetic injected can often make your back or neck feel good for several hours after the injection, the pain will likely return.  It takes 3-7 days for steroids to work in the epidural space.  You may not notice any pain relief for at least that one week.  If effective, we will often do a series of three injections spaced 3-6 weeks apart to maximally decrease your pain.  After the initial series, we generally will wait several months before considering a repeat injection of the same type.  If you have any questions, please call [phone] [facility] 

## 2016-10-31 ENCOUNTER — Telehealth: Payer: Self-pay

## 2016-10-31 NOTE — Telephone Encounter (Signed)
Patient states headache is better but states she is still sore- Instructed to call if needed.

## 2016-11-02 NOTE — Assessment & Plan Note (Signed)
Controlled, PHQ-2 score of zero today

## 2016-11-02 NOTE — Assessment & Plan Note (Addendum)
Patient filing for disability; fibro "fog" versus other neurologic issue; white matter disease on brain imaging per patient; she'll follow back up with Dr. Manuella Ghazi about her progressive symptoms to see if other process going on

## 2016-11-02 NOTE — Assessment & Plan Note (Signed)
We reviewed the anti-inflammatory diet suggested by pain clinic doctor

## 2016-11-20 ENCOUNTER — Ambulatory Visit (INDEPENDENT_AMBULATORY_CARE_PROVIDER_SITE_OTHER): Payer: BLUE CROSS/BLUE SHIELD | Admitting: Podiatry

## 2016-11-20 DIAGNOSIS — M722 Plantar fascial fibromatosis: Secondary | ICD-10-CM

## 2016-11-20 NOTE — Progress Notes (Signed)
She presents today for follow-up of her plantar fasciitis to her left heel. She presents ambulating a walker because of her bad back. She states that her foot is approximately 90% resolved and appears to be getting better all the time.  Objective: Vital signs are stable alert and oriented 3. Pulses are palpable. Neurologic sensorium is intact. She has pain on palpation medial calcaneal tubercle of the left heel but much decreased from previous evaluations according to previous notes.  Assessment: Well-healing plan a fascitis left.  Plan: Continue conservative therapies including plantar fascia brace night splint and oral therapy. We did discuss the need for neurosurgery to evaluate her spine and I gave her names of [place] neurosurgery physicians today. Follow up with her as needed

## 2016-11-27 ENCOUNTER — Other Ambulatory Visit: Payer: Self-pay | Admitting: Family Medicine

## 2016-11-27 ENCOUNTER — Ambulatory Visit (INDEPENDENT_AMBULATORY_CARE_PROVIDER_SITE_OTHER): Payer: BLUE CROSS/BLUE SHIELD | Admitting: Family Medicine

## 2016-11-27 ENCOUNTER — Encounter: Payer: Self-pay | Admitting: Family Medicine

## 2016-11-27 DIAGNOSIS — E782 Mixed hyperlipidemia: Secondary | ICD-10-CM | POA: Diagnosis not present

## 2016-11-27 DIAGNOSIS — M797 Fibromyalgia: Secondary | ICD-10-CM | POA: Diagnosis not present

## 2016-11-27 DIAGNOSIS — E781 Pure hyperglyceridemia: Secondary | ICD-10-CM

## 2016-11-27 DIAGNOSIS — Z5181 Encounter for therapeutic drug level monitoring: Secondary | ICD-10-CM | POA: Diagnosis not present

## 2016-11-27 LAB — LIPID PANEL
CHOLESTEROL: 181 mg/dL (ref <200)
HDL: 36 mg/dL — ABNORMAL LOW (ref >50)
Total CHOL/HDL Ratio: 5 Ratio — ABNORMAL HIGH (ref <5.0)
Triglycerides: 425 mg/dL — ABNORMAL HIGH (ref <150)

## 2016-11-27 LAB — ALT: ALT: 23 U/L (ref 6–29)

## 2016-11-27 MED ORDER — ATORVASTATIN CALCIUM 10 MG PO TABS: 10.0000 mg | ORAL_TABLET | Freq: Every day | ORAL | 3 refills | Status: DC

## 2016-11-27 MED ORDER — OMEGA-3-ACID ETHYL ESTERS 1 G PO CAPS: 2.0000 g | ORAL_CAPSULE | Freq: Two times a day (BID) | ORAL | 11 refills | Status: DC

## 2016-11-27 NOTE — Progress Notes (Signed)
BP 108/62   Pulse 86   Temp 98.2 F (36.8 C) (Oral)   Resp 14   Wt 210 lb 6.4 oz (95.4 kg)   LMP 10/31/2016   SpO2 97%   BMI 37.27 kg/m    Subjective:    Patient ID: Dana Bradley, female    DOB: 12/07/1970, 46 y.o.   MRN: [id number]  HPI: Dana Bradley is a 46 y.o. female  Chief Complaint  Patient presents with  . Follow-up   Patient is here for "follow-up"; she has multiple issues, so we opted to discuss fibro and cholesterol today She is fairly okay other than not able to do ADLs She would like to discuss fibromyalgia today; she is hoping Dr. Dossie Arbour will help with her back Difficulty with brushing her hair and washing her hair Dizziness when laying down, not when walking; more often, a few times a day; like something not getting through; just stops, foot won't find the floor; falling; feels like she'll curve off to the right; she'll see neurologist; doubts PT would help; nerves in her back might not be firing right Has been taking lyrica for her fibro; cymbalta was not a good medicine, made her depression worse Taking amitriptyline but gets a hangover if taking too much; trying camomile and valerian root Can't do everything she needs to do She describes her pain as a combination of having deep bruise pain like after falling downstairs, combined with pain like having exercised too much, plus a sunburn Tries to get regular walking daily; [place]; "I have coyotes" Last visit was Oct 21, 2016  High cholesterol; she is due for fasting labs Last total was 219; HDL 37, LDL 132, TG 252 Trying to work on it; not eating fried foods very often; baking more More rice and lentils and quinoa Took statin in the past and it made aching worse Tries to take statin just three days a week and that's okay now High cholesterol runs in the family  Depression screen [facility] 2/9 11/27/2016 10/30/2016 10/23/2016 09/16/2016 08/28/2016  Decreased Interest 0  0 0 0 0  Down, Depressed, Hopeless 0 0 0 0 1  PHQ - 2 Score 0 0 0 0 1  Altered sleeping - - - - -  Tired, decreased energy - - - - -  Change in appetite - - - - -  Feeling bad or failure about yourself  - - - - -  Trouble concentrating - - - - -  Moving slowly or fidgety/restless - - - - -  Suicidal thoughts - - - - -  PHQ-9 Score - - - - -  Difficult doing work/chores - - - - -   Relevant past medical, surgical, family and social history reviewed Past Medical History:  Diagnosis Date  . Bursitis   . Edema leg 05/02/2015  . Fibromyalgia   . GERD (gastroesophageal reflux disease)   . IBS (irritable bowel syndrome)   . Knee pain, bilateral 12/21/2008   Qualifier: Diagnosis of  By: Hassell Done FNP, Tori Milks    . Lumbar discitis   . Osteoarthritis   . Right hand pain 04/10/2015   [facility] Neu[facility] has done nerve conduction studies and ruled out carpal tunnel.   . Sleep apnea   . SVT (supraventricular tachycardia) ([place])   . Vertigo   . Vitamin D deficiency 05/01/2016   Past Surgical History:  Procedure Laterality Date  . ABLATION     Uterine  .  CARDIAC CATHETERIZATION    . COLONOSCOPY WITH PROPOFOL N/A 05/17/2015   Procedure: COLONOSCOPY WITH PROPOFOL;  Surgeon: Manya Silvas, MD;  Location: [facility] ENDOSCOPY;  Service: Endoscopy;  Laterality: N/A;  . ESOPHAGOGASTRODUODENOSCOPY N/A 05/17/2015   Procedure: ESOPHAGOGASTRODUODENOSCOPY (EGD);  Surgeon: Manya Silvas, MD;  Location: [facility] ENDOSCOPY;  Service: Endoscopy;  Laterality: N/A;  . KNEE ARTHROSCOPY    . TUBAL LIGATION  10/01/99   Family History  Problem Relation Age of Onset  . Depression Mother   . Hypertension Mother   . Cancer Mother     Skin  . Hyperlipidemia Mother   . Alcohol abuse Father   . Depression Father   . Stroke Father   . Heart disease Father   . Hypertension Father   . Depression Sister   . Hyperlipidemia Sister   . Diabetes Sister   . Hypertension Sister   . Polycystic ovary syndrome Sister   .  Bipolar disorder Sister   . Cancer Maternal Grandmother 21    Breast  . Thyroid disease Maternal Grandmother   . Arthritis Maternal Grandmother   . Hyperlipidemia Maternal Grandmother   . Depression Sister   . Hypertension Sister   . Alzheimer's disease    . Aneurysm Maternal Grandfather   . Hypertension Maternal Grandfather   . Heart disease Maternal Grandfather   . Alzheimer's disease Paternal Grandmother   . Heart attack Paternal Grandfather   . Hypertension Paternal Grandfather   . COPD Paternal Grandfather   . Heart disease Paternal Grandfather   . Bladder Cancer Neg Hx   . Kidney cancer Neg Hx    Social History  Substance Use Topics  . Smoking status: Former Smoker    Packs/day: 4.00    Years: 3.00    Types: Cigarettes    Quit date: 12/10/1992  . Smokeless tobacco: Never Used  . Alcohol use No   Interim medical history since last visit reviewed. Allergies and medications reviewed  Review of Systems Per HPI unless specifically indicated above     Objective:    BP 108/62   Pulse 86   Temp 98.2 F (36.8 C) (Oral)   Resp 14   Wt 210 lb 6.4 oz (95.4 kg)   LMP 10/31/2016   SpO2 97%   BMI 37.27 kg/m   Wt Readings from Last 3 Encounters:  11/27/16 210 lb 6.4 oz (95.4 kg)  10/30/16 208 lb (94.3 kg)  10/23/16 210 lb (95.3 kg)    Physical Exam  Constitutional: She appears well-developed and well-nourished.  HENT:  Mouth/Throat: Mucous membranes are normal.  Eyes: EOM are normal. No scleral icterus.  Cardiovascular: Normal rate and regular rhythm.   Pulmonary/Chest: Effort normal and breath sounds normal.  Musculoskeletal:  Pain over pressure point left elbow  Psychiatric: She has a normal mood and affect. Her behavior is normal. Her mood appears not anxious. She does not exhibit a depressed mood.    Results for orders placed or performed in visit on 08/28/16  Lipid panel  Result Value Ref Range   Cholesterol 219 (H) <200 mg/dL   Triglycerides 252 (H)  <150 mg/dL   HDL 37 (L) >50 mg/dL   Total CHOL/HDL Ratio 5.9 (H) <5.0 Ratio   VLDL 50 (H) <30 mg/dL   LDL Cholesterol 132 (H) <100 mg/dL      Assessment & Plan:   Problem List Items Addressed This Visit      Other   Medication monitoring encounter    Check sgpt on  statin      Hyperlipidemia (Chronic)    Encouragement given for healthy eating; will check fasting lipids today (on statin three times a week)      Fibromyalgia (Chronic)    Impacting quality of life and her ability to do IADLs and ADLs; on lyrica; continue to try to get regular walking, good sleep; she did not tolerate cymbalta         Follow up plan: Return in about 6 months (around 05/27/2017) for fasting labs and visit.  An after-visit summary was printed and given to the patient at [place].  Please see the patient instructions which may contain other information and recommendations beyond what is mentioned above in the assessment and plan.  Meds ordered this encounter  Medications  . amitriptyline (ELAVIL) 10 MG tablet    Sig: Take 10 mg by mouth as needed.

## 2016-11-27 NOTE — Assessment & Plan Note (Signed)
Check sgpt on statin 

## 2016-11-27 NOTE — Assessment & Plan Note (Signed)
Add Lovaza, recheck labs in 3 months

## 2016-11-27 NOTE — Assessment & Plan Note (Signed)
Encouragement given for healthy eating; will check fasting lipids today (on statin three times a week)

## 2016-11-27 NOTE — Progress Notes (Signed)
Add lovaza; recheck lipids around May 2018

## 2016-11-27 NOTE — Assessment & Plan Note (Addendum)
Impacting quality of life and her ability to do IADLs and ADLs; on lyrica; continue to try to get regular walking, good sleep; she did not tolerate cymbalta

## 2016-11-27 NOTE — Patient Instructions (Signed)
Try to limit saturated fats in your diet (bologna, hot dogs, barbeque, cheeseburgers, hamburgers, steak, bacon, sausage, cheese, etc.) and get more fresh fruits, vegetables, and whole grains Let's get labs today Talk to your neurologist about your symptoms

## 2016-12-03 ENCOUNTER — Telehealth: Payer: Self-pay | Admitting: *Deleted

## 2016-12-04 ENCOUNTER — Ambulatory Visit: Payer: BLUE CROSS/BLUE SHIELD | Admitting: Pain Medicine

## 2016-12-24 ENCOUNTER — Encounter: Payer: Self-pay | Admitting: Pain Medicine

## 2016-12-24 ENCOUNTER — Ambulatory Visit
Admission: RE | Admit: 2016-12-24 | Discharge: 2016-12-24 | Disposition: A | Discharge status: Home / Self Care | Payer: BLUE CROSS/BLUE SHIELD | Source: Ambulatory Visit | Attending: Pain Medicine | Admitting: Pain Medicine

## 2016-12-24 ENCOUNTER — Ambulatory Visit (HOSPITAL_BASED_OUTPATIENT_CLINIC_OR_DEPARTMENT_OTHER): Payer: BLUE CROSS/BLUE SHIELD | Admitting: Pain Medicine

## 2016-12-24 VITALS — BP 108/74 | HR 76 | Temp 98.2°F | Resp 16 | Ht 63.0 in | Wt 216.0 lb

## 2016-12-24 DIAGNOSIS — K589 Irritable bowel syndrome without diarrhea: Secondary | ICD-10-CM | POA: Insufficient documentation

## 2016-12-24 DIAGNOSIS — M5481 Occipital neuralgia: Secondary | ICD-10-CM | POA: Diagnosis not present

## 2016-12-24 DIAGNOSIS — M488X6 Other specified spondylopathies, lumbar region: Secondary | ICD-10-CM | POA: Insufficient documentation

## 2016-12-24 DIAGNOSIS — Z818 Family history of other mental and behavioral disorders: Secondary | ICD-10-CM | POA: Insufficient documentation

## 2016-12-24 DIAGNOSIS — M79641 Pain in right hand: Secondary | ICD-10-CM | POA: Insufficient documentation

## 2016-12-24 DIAGNOSIS — E669 Obesity, unspecified: Secondary | ICD-10-CM | POA: Diagnosis not present

## 2016-12-24 DIAGNOSIS — Z79899 Other long term (current) drug therapy: Secondary | ICD-10-CM | POA: Diagnosis not present

## 2016-12-24 DIAGNOSIS — I10 Essential (primary) hypertension: Secondary | ICD-10-CM | POA: Diagnosis not present

## 2016-12-24 DIAGNOSIS — Z87891 Personal history of nicotine dependence: Secondary | ICD-10-CM | POA: Diagnosis not present

## 2016-12-24 DIAGNOSIS — M4712 Other spondylosis with myelopathy, cervical region: Secondary | ICD-10-CM | POA: Insufficient documentation

## 2016-12-24 DIAGNOSIS — M1288 Other specific arthropathies, not elsewhere classified, other specified site: Secondary | ICD-10-CM | POA: Diagnosis not present

## 2016-12-24 DIAGNOSIS — M549 Dorsalgia, unspecified: Secondary | ICD-10-CM | POA: Diagnosis not present

## 2016-12-24 DIAGNOSIS — M62838 Other muscle spasm: Secondary | ICD-10-CM | POA: Diagnosis not present

## 2016-12-24 DIAGNOSIS — Z886 Allergy status to analgesic agent status: Secondary | ICD-10-CM | POA: Insufficient documentation

## 2016-12-24 DIAGNOSIS — M5137 Other intervertebral disc degeneration, lumbosacral region: Secondary | ICD-10-CM | POA: Insufficient documentation

## 2016-12-24 DIAGNOSIS — G894 Chronic pain syndrome: Secondary | ICD-10-CM | POA: Insufficient documentation

## 2016-12-24 DIAGNOSIS — E559 Vitamin D deficiency, unspecified: Secondary | ICD-10-CM | POA: Diagnosis not present

## 2016-12-24 DIAGNOSIS — E538 Deficiency of other specified B group vitamins: Secondary | ICD-10-CM | POA: Diagnosis not present

## 2016-12-24 DIAGNOSIS — M4722 Other spondylosis with radiculopathy, cervical region: Secondary | ICD-10-CM | POA: Diagnosis not present

## 2016-12-24 DIAGNOSIS — Z79891 Long term (current) use of opiate analgesic: Secondary | ICD-10-CM | POA: Diagnosis not present

## 2016-12-24 DIAGNOSIS — M5442 Lumbago with sciatica, left side: Secondary | ICD-10-CM | POA: Diagnosis not present

## 2016-12-24 DIAGNOSIS — Z8261 Family history of arthritis: Secondary | ICD-10-CM | POA: Insufficient documentation

## 2016-12-24 DIAGNOSIS — M5412 Radiculopathy, cervical region: Secondary | ICD-10-CM

## 2016-12-24 DIAGNOSIS — Z825 Family history of asthma and other chronic lower respiratory diseases: Secondary | ICD-10-CM | POA: Insufficient documentation

## 2016-12-24 DIAGNOSIS — Z833 Family history of diabetes mellitus: Secondary | ICD-10-CM | POA: Insufficient documentation

## 2016-12-24 DIAGNOSIS — M533 Sacrococcygeal disorders, not elsewhere classified: Secondary | ICD-10-CM

## 2016-12-24 DIAGNOSIS — G4733 Obstructive sleep apnea (adult) (pediatric): Secondary | ICD-10-CM | POA: Diagnosis not present

## 2016-12-24 DIAGNOSIS — M797 Fibromyalgia: Secondary | ICD-10-CM | POA: Insufficient documentation

## 2016-12-24 DIAGNOSIS — M5441 Lumbago with sciatica, right side: Secondary | ICD-10-CM

## 2016-12-24 DIAGNOSIS — F4001 Agoraphobia with panic disorder: Secondary | ICD-10-CM | POA: Insufficient documentation

## 2016-12-24 DIAGNOSIS — F1190 Opioid use, unspecified, uncomplicated: Secondary | ICD-10-CM

## 2016-12-24 DIAGNOSIS — F329 Major depressive disorder, single episode, unspecified: Secondary | ICD-10-CM | POA: Insufficient documentation

## 2016-12-24 DIAGNOSIS — Z888 Allergy status to other drugs, medicaments and biological substances status: Secondary | ICD-10-CM | POA: Insufficient documentation

## 2016-12-24 DIAGNOSIS — M47816 Spondylosis without myelopathy or radiculopathy, lumbar region: Secondary | ICD-10-CM

## 2016-12-24 DIAGNOSIS — Z811 Family history of alcohol abuse and dependence: Secondary | ICD-10-CM | POA: Insufficient documentation

## 2016-12-24 DIAGNOSIS — G8929 Other chronic pain: Secondary | ICD-10-CM

## 2016-12-24 DIAGNOSIS — Z882 Allergy status to sulfonamides status: Secondary | ICD-10-CM | POA: Insufficient documentation

## 2016-12-24 DIAGNOSIS — F411 Generalized anxiety disorder: Secondary | ICD-10-CM | POA: Insufficient documentation

## 2016-12-24 DIAGNOSIS — M25552 Pain in left hip: Secondary | ICD-10-CM | POA: Insufficient documentation

## 2016-12-24 DIAGNOSIS — M5416 Radiculopathy, lumbar region: Secondary | ICD-10-CM | POA: Insufficient documentation

## 2016-12-24 DIAGNOSIS — Z8249 Family history of ischemic heart disease and other diseases of the circulatory system: Secondary | ICD-10-CM | POA: Insufficient documentation

## 2016-12-24 DIAGNOSIS — E785 Hyperlipidemia, unspecified: Secondary | ICD-10-CM | POA: Insufficient documentation

## 2016-12-24 DIAGNOSIS — K219 Gastro-esophageal reflux disease without esophagitis: Secondary | ICD-10-CM | POA: Diagnosis not present

## 2016-12-24 DIAGNOSIS — Z91040 Latex allergy status: Secondary | ICD-10-CM | POA: Insufficient documentation

## 2016-12-24 DIAGNOSIS — Z8349 Family history of other endocrine, nutritional and metabolic diseases: Secondary | ICD-10-CM | POA: Insufficient documentation

## 2016-12-24 DIAGNOSIS — Z885 Allergy status to narcotic agent status: Secondary | ICD-10-CM | POA: Insufficient documentation

## 2016-12-24 DIAGNOSIS — Z880 Allergy status to penicillin: Secondary | ICD-10-CM | POA: Insufficient documentation

## 2016-12-24 DIAGNOSIS — Z823 Family history of stroke: Secondary | ICD-10-CM | POA: Insufficient documentation

## 2016-12-24 DIAGNOSIS — M545 Low back pain: Secondary | ICD-10-CM | POA: Diagnosis present

## 2016-12-24 DIAGNOSIS — Z6838 Body mass index (BMI) 38.0-38.9, adult: Secondary | ICD-10-CM | POA: Insufficient documentation

## 2016-12-24 MED ORDER — TIZANIDINE HCL 2 MG PO TABS: 2.0000 mg | ORAL_TABLET | Freq: Four times a day (QID) | ORAL | 2 refills | Status: DC | PRN

## 2016-12-24 MED ORDER — HYDROCODONE-ACETAMINOPHEN 7.5-325 MG PO TABS: 1.0000 | ORAL_TABLET | Freq: Four times a day (QID) | ORAL | 0 refills | Status: DC | PRN

## 2016-12-24 MED ORDER — PREGABALIN 150 MG PO CAPS: 150.0000 mg | ORAL_CAPSULE | Freq: Three times a day (TID) | ORAL | 2 refills | Status: DC

## 2016-12-24 NOTE — Patient Instructions (Addendum)
Preparing for Procedure with Sedation Instructions: . Oral Intake: Do not eat or drink anything for at least 8 hours prior to your procedure. . Transportation: Public transportation is not allowed. Bring an adult driver. The driver must be physically present in our waiting room before any procedure can be started. Marland Kitchen Physical Assistance: Bring an adult physically capable of assisting you, in the event you need help. This adult should keep you company at home for at least 6 hours after the procedure. . Blood Pressure Medicine: Take your blood pressure medicine with a sip of water the morning of the procedure. . Blood thinners:  . Diabetics on insulin: Notify the staff so that you can be scheduled 1st case in the morning. If your diabetes requires high dose insulin, take only  of your normal insulin dose the morning of the procedure and notify the staff that you have done so. . Preventing infections: Shower with an antibacterial soap the morning of your procedure. . Build-up your immune system: Take 1000 mg of Vitamin C with every meal (3 times a day) the day prior to your procedure. Marland Kitchen Antibiotics: Inform the staff if you have a condition or reason that requires you to take antibiotics before dental procedures. . Pregnancy: If you are pregnant, call and cancel the procedure. . Sickness: If you have a cold, fever, or any active infections, call and cancel the procedure. . Arrival: You must be in the facility at least 30 minutes prior to your scheduled procedure. . Children: Do not bring children with you. . Dress appropriately: Bring dark clothing that you would not mind if they get stained. . Valuables: Do not bring any jewelry or valuables. Procedure appointments are reserved for interventional treatments only. Marland Kitchen No Prescription Refills. . No medication changes will be discussed during procedure appointments. . No disability issues will be  discussed.  ____________________________________________________________________________________________  Sacroiliac (SI) Joint Injection Patient Information  Description: The sacroiliac joint connects the scrum (very low back and tailbone) to the ilium (a pelvic bone which also forms half of the hip joint).  Normally this joint experiences very little motion.  When this joint becomes inflamed or unstable low back and or hip and pelvis pain may result.  Injection of this joint with local anesthetics (numbing medicines) and steroids can provide diagnostic information and reduce pain.  This injection is performed with the aid of x-ray guidance into the tailbone area while you are lying on your stomach.   You may experience an electrical sensation down the leg while this is being done.  You may also experience numbness.  We also may ask if we are reproducing your normal pain during the injection.  Conditions which may be treated SI injection:   Low back, buttock, hip or leg pain  Preparation for the Injection:  1. Do not eat any solid food or dairy products within 8 hours of your appointment.  2. You may drink clear liquids up to 3 hours before appointment.  Clear liquids include water, black coffee, juice or soda.  No milk or cream please. 3. You may take your regular medications, including pain medications with a sip of water before your appointment.  Diabetics should hold regular insulin (if take separately) and take 1/2 normal NPH dose the morning of the procedure.  Carry some sugar containing items with you to your appointment. 4. A driver must accompany you and be prepared to drive you home after your procedure. 5. Bring all of your current medications with  you. 6. An IV may be inserted and sedation may be given at the discretion of the physician. 7. A blood pressure cuff, EKG and other monitors will often be applied during the procedure.  Some patients may need to have extra oxygen  administered for a short period.  8. You will be asked to provide medical information, including your allergies, prior to the procedure.  We must know immediately if you are taking blood thinners (like Coumadin/Warfarin) or if you are allergic to IV iodine contrast (dye).  We must know if you could possible be pregnant.  Possible side effects:   Bleeding from needle site  Infection (rare, may require surgery)  Nerve injury (rare)  Numbness & tingling (temporary)  A brief convulsion or seizure  Light-headedness (temporary)  Pain at injection site (several days)  Decreased blood pressure (temporary)  Weakness in the leg (temporary)   Call if you experience:   New onset weakness or numbness of an extremity below the injection site that last more than 8 hours.  Hives or difficulty breathing ( go to the emergency room)  Inflammation or drainage at the injection site  Any new symptoms which are concerning to you  Please note:  Although the local anesthetic injected can often make your back/ hip/ buttock/ leg feel good for several hours after the injections, the pain will likely return.  It takes 3-7 days for steroids to work in the sacroiliac area.  You may not notice any pain relief for at least that one week.  If effective, we will often do a series of three injections spaced 3-6 weeks apart to maximally decrease your pain.  After the initial series, we generally will wait some months before a repeat injection of the same type.  If you have any questions, please call [phone] [place]  What are the risk, side effects and possible complications? Generally speaking, most procedures are safe.  However, with any procedure there are risks, side effects, and the possibility of complications.  The risks and complications are dependent upon the sites that are lesioned, or the type of nerve block to be  performed.  The closer the procedure is to the spine, the more serious the risks are.  Great care is taken when placing the radio frequency needles, block needles or lesioning probes, but sometimes complications can occur. 1. Infection: Any time there is an injection through the skin, there is a risk of infection.  This is why sterile conditions are used for these blocks.  There are four possible types of infection. 1. Localized skin infection. 2. Central Nervous System Infection-This can be in the form of Meningitis, which can be deadly. 3. Epidural Infections-This can be in the form of an epidural abscess, which can cause pressure inside of the spine, causing compression of the spinal cord with subsequent paralysis. This would require an emergency surgery to decompress, and there are no guarantees that the patient would recover from the paralysis. 4. Discitis-This is an infection of the intervertebral discs.  It occurs in about 1% of discography procedures.  It is difficult to treat and it may lead to surgery.        2. Pain: the needles have to go through skin and soft tissues, will cause soreness.       3. Damage to internal structures:  The nerves to be lesioned may be near blood vessels or    other nerves which  can be potentially damaged.       4. Bleeding: Bleeding is more common if the patient is taking blood thinners such as  aspirin, Coumadin, Ticiid, Plavix, etc., or if he/she have some genetic predisposition  such as hemophilia. Bleeding into the spinal canal can cause compression of the spinal  cord with subsequent paralysis.  This would require an emergency surgery to  decompress and there are no guarantees that the patient would recover from the  paralysis.       5. Pneumothorax:  Puncturing of a lung is a possibility, every time a needle is introduced in  the area of the chest or upper back.  Pneumothorax refers to free air around the  collapsed lung(s), inside of the thoracic cavity  (chest cavity).  Another two possible  complications related to a similar event would include: Hemothorax and Chylothorax.   These are variations of the Pneumothorax, where instead of air around the collapsed  lung(s), you may have blood or chyle, respectively.       6. Spinal headaches: They may occur with any procedures in the area of the spine.       7. Persistent CSF (Cerebro-Spinal Fluid) leakage: This is a rare problem, but may occur  with prolonged intrathecal or epidural catheters either due to the formation of a fistulous  track or a dural tear.       8. Nerve damage: By working so close to the spinal cord, there is always a possibility of  nerve damage, which could be as serious as a permanent spinal cord injury with  paralysis.       9. Death:  Although rare, severe deadly allergic reactions known as "Anaphylactic  reaction" can occur to any of the medications used.      10. Worsening of the symptoms:  We can always make thing worse.  What are the chances of something like this happening? Chances of any of this occuring are extremely low.  By statistics, you have more of a chance of getting killed in a motor vehicle accident: while driving to the hospital than any of the above occurring .  Nevertheless, you should be aware that they are possibilities.  In general, it is similar to taking a shower.  Everybody knows that you can slip, hit your head and get killed.  Does that mean that you should not shower again?  Nevertheless always keep in mind that statistics do not mean anything if you happen to be on the wrong side of them.  Even if a procedure has a 1 (one) in a 1,000,000 (million) chance of going wrong, it you happen to be that one..Also, keep in mind that by statistics, you have more of a chance of having something go wrong when taking medications.  Who should not have this procedure? If you are on a blood thinning medication (e.g. Coumadin, Plavix, see list of "Blood Thinners"), or if  you have an active infection going on, you should not have the procedure.  If you are taking any blood thinners, please inform your physician.  How should I prepare for this procedure?  Do not eat or drink anything at least six hours prior to the procedure.  Bring a driver with you .  It cannot be a taxi.  Come accompanied by an adult that can drive you back, and that is strong enough to help you if your legs get weak or numb from the local anesthetic.  Take all of your  medicines the morning of the procedure with just enough water to swallow them.  If you have diabetes, make sure that you are scheduled to have your procedure done first thing in the morning, whenever possible.  If you have diabetes, take only half of your insulin dose and notify our nurse that you have done so as soon as you arrive at the clinic.  If you are diabetic, but only take blood sugar pills (oral hypoglycemic), then do not take them on the morning of your procedure.  You may take them after you have had the procedure.  Do not take aspirin or any aspirin-containing medications, at least eleven (11) days prior to the procedure.  They may prolong bleeding.  Wear loose fitting clothing that may be easy to take off and that you would not mind if it got stained with Betadine or blood.  Do not wear any jewelry or perfume  Remove any nail coloring.  It will interfere with some of our monitoring equipment.  NOTE: Remember that this is not meant to be interpreted as a complete list of all possible complications.  Unforeseen problems may occur.  BLOOD THINNERS The following drugs contain aspirin or other products, which can cause increased bleeding during surgery and should not be taken for 2 weeks prior to and 1 week after surgery.  If you should need take something for relief of minor pain, you may take acetaminophen which is found in Tylenol,m Datril, Anacin-3 and Panadol. It is not blood thinner. The products listed  below are.  Do not take any of the products listed below in addition to any listed on your instruction sheet.  A.P.C or A.P.C with Codeine Codeine Phosphate Capsules #3 Ibuprofen Ridaura  ABC compound Congesprin Imuran rimadil  Advil Cope Indocin Robaxisal  Alka-Seltzer Effervescent Pain Reliever and Antacid Coricidin or Coricidin-D  Indomethacin Rufen  Alka-Seltzer plus Cold Medicine Cosprin Ketoprofen S-A-C Tablets  Anacin Analgesic Tablets or Capsules Coumadin Korlgesic Salflex  Anacin Extra Strength Analgesic tablets or capsules CP-2 Tablets Lanoril Salicylate  Anaprox Cuprimine Capsules Levenox Salocol  Anexsia-D Dalteparin Magan Salsalate  Anodynos Darvon compound Magnesium Salicylate Sine-off  Ansaid Dasin Capsules Magsal Sodium Salicylate  Anturane Depen Capsules Marnal Soma  APF Arthritis pain formula Dewitt's Pills Measurin Stanback  Argesic Dia-Gesic Meclofenamic Sulfinpyrazone  Arthritis Bayer Timed Release Aspirin Diclofenac Meclomen Sulindac  Arthritis pain formula Anacin Dicumarol Medipren Supac  Analgesic (Safety coated) Arthralgen Diffunasal Mefanamic Suprofen  Arthritis Strength Bufferin Dihydrocodeine Mepro Compound Suprol  Arthropan liquid Dopirydamole Methcarbomol with Aspirin Synalgos  ASA tablets/Enseals Disalcid Micrainin Tagament  Ascriptin Doan's Midol Talwin  Ascriptin A/D Dolene Mobidin Tanderil  Ascriptin Extra Strength Dolobid Moblgesic Ticlid  Ascriptin with Codeine Doloprin or Doloprin with Codeine Momentum Tolectin  Asperbuf Duoprin Mono-gesic Trendar  Aspergum Duradyne Motrin or Motrin IB Triminicin  Aspirin plain, buffered or enteric coated Durasal Myochrisine Trigesic  Aspirin Suppositories Easprin Nalfon Trillsate  Aspirin with Codeine Ecotrin Regular or Extra Strength Naprosyn Uracel  Atromid-S Efficin Naproxen Ursinus  Auranofin Capsules Elmiron Neocylate Vanquish  Axotal Emagrin Norgesic Verin  Azathioprine Empirin or Empirin with Codeine  Normiflo Vitamin E  Azolid Emprazil Nuprin Voltaren  Bayer Aspirin plain, buffered or children's or timed BC Tablets or powders Encaprin Orgaran Warfarin Sodium  Buff-a-Comp Enoxaparin Orudis Zorpin  Buff-a-Comp with Codeine Equegesic Os-Cal-Gesic   Buffaprin Excedrin plain, buffered or Extra Strength Oxalid   Bufferin Arthritis Strength Feldene Oxphenbutazone   Bufferin plain or Extra Strength Feldene Capsules Oxycodone with Aspirin  Bufferin with Codeine Fenoprofen Fenoprofen Pabalate or Pabalate-SF   Buffets II Flogesic Panagesic   Buffinol plain or Extra Strength Florinal or Florinal with Codeine Panwarfarin   Buf-Tabs Flurbiprofen Penicillamine   Butalbital Compound Four-way cold tablets Penicillin   Butazolidin Fragmin Pepto-Bismol   Carbenicillin Geminisyn Percodan   Carna Arthritis Reliever Geopen Persantine   Carprofen Gold's salt Persistin   Chloramphenicol Goody's Phenylbutazone   Chloromycetin Haltrain Piroxlcam   Clmetidine heparin Plaquenil   Cllnoril Hyco-pap Ponstel   Clofibrate Hydroxy chloroquine Propoxyphen         Before stopping any of these medications, be sure to consult the physician who ordered them.  Some, such as Coumadin (Warfarin) are ordered to prevent or treat serious conditions such as "deep thrombosis", "pumonary embolisms", and other heart problems.  The amount of time that you may need off of the medication may also vary with the medication and the reason for which you were taking it.  If you are taking any of these medications, please make sure you notify your pain physician before you undergo any procedures.          Facet Joint Block The facet joints connect the bones of the spine (vertebrae). They make it possible for you to bend, twist, and make other movements with your spine. They also keep you from bending too far, twisting too far, and making other excessive movements. A facet joint block is a procedure where a numbing medicine  (anesthetic) is injected into a facet joint. Often, a type of anti-inflammatory medicine called a steroid is also injected. A facet joint block may be done to diagnose neck or back pain. If the pain gets better after a facet joint block, it means the pain is probably coming from the facet joint. If the pain does not get better, it means the pain is probably not coming from the facet joint. A facet joint block may also be done to relieve neck or back pain caused by an inflamed facet joint. A facet joint block is only done to relieve pain if the pain does not improve with other methods, such as medicine, exercise programs, and physical therapy. Tell a health care provider about: Any allergies you have. All medicines you are taking, including vitamins, herbs, eye drops, creams, and over-the-counter medicines. Any problems you or family members have had with anesthetic medicines. Any blood disorders you have. Any surgeries you have had. Any medical conditions you have. Whether you are pregnant or may be pregnant. What are the risks? Generally, this is a safe procedure. However, problems may occur, including: Bleeding. Injury to a nerve near the injection site. Pain at the injection site. Weakness or numbness in areas controlled by nerves near the injection site. Infection. Temporary fluid retention. Allergic reactions to medicines or dyes. Injury to other structures or organs near the injection site. What happens before the procedure? Follow instructions from your health care provider about eating or drinking restrictions. Ask your health care provider about: Changing or stopping your regular medicines. This is especially important if you are taking diabetes medicines or blood thinners. Taking medicines such as aspirin and ibuprofen. These medicines can thin your blood. Do not take these medicines before your procedure if your health care provider instructs you not to. Do not take any new dietary  supplements or medicines without asking your health care provider first. Plan to have someone take you home after the procedure. What happens during the procedure? You may need to remove your  clothing and dress in an open-back gown. The procedure will be done while you are lying on an X-ray table. You will most likely be asked to lie on your stomach, but you may be asked to lie in a different position if an injection will be made in your neck. Machines will be used to monitor your oxygen levels, heart rate, and blood pressure. If an injection will be made in your neck, an IV tube will be inserted into one of your veins. Fluids and medicine will flow directly into your body through the IV tube. The area over the facet joint where the injection will be made will be cleaned with soap. The surrounding skin will be covered with clean drapes. A numbing medicine (local anesthetic) will be applied to your skin. Your skin may sting or burn for a moment. A video X-ray machine (fluoroscopy) will be used to locate the joint. In some cases, a CT scan may be used. A contrast dye may be injected into the facet joint area to help locate the joint. When the joint is located, an anesthetic will be injected into the joint through the needle. Your health care provider will ask you whether you feel pain relief. If you do feel relief, a steroid may be injected to provide pain relief for a longer period of time. If you do not feel relief or feel only partial relief, additional injections of an anesthetic may be made in other facet joints. The needle will be removed. Your skin will be cleaned. A bandage (dressing) will be applied over each injection site. The procedure may vary among health care providers and hospitals. What happens after the procedure? You will be observed for 15-30 minutes before being allowed to go home. This information is not intended to replace advice given to you by your health care provider. Make  sure you discuss any questions you have with your health care provider. Document Released: 02/05/2007 Document Revised: 10/18/2015 Document Reviewed: 06/12/2015 [employer] Interactive Patient Education  2017 [employer].

## 2016-12-24 NOTE — Progress Notes (Signed)
Patient's Name: Dana Bradley  MRN: [id number]  Referring Provider: Arnetha Courser, MD  DOB: March 30, 1971  PCP: Arnetha Courser, MD  DOS: 12/24/2016  Note by: Kathlen Brunswick. Dossie Arbour, MD  Service setting: Ambulatory outpatient  Specialty: Interventional Pain Management  Location: [facility] (AMB) Pain Management Facility    Patient type: Established   Primary Reason(s) for Visit: Encounter for prescription drug management & post-procedure evaluation of chronic illness with mild to moderate exacerbation(Level of risk: moderate) CC: Back Pain (lower bilateral )  HPI  Dana Bradley is a 46 y.o. year old, female patient, who comes today for a post-procedure evaluation and medication management. She has Depression, major, recurrent, in remission ([place]); Hypertension, benign essential, goal below 140/90; History of PSVT (paroxysmal supraventricular tachycardia); GERD; Obstructive sleep apnea, adult; Menorrhagia; Cervico-occipital neuralgia; DDD (degenerative disc disease), lumbosacral; Paroxysmal supraventricular tachycardia ([place]); Chronic pain of multiple joints; Chronic superficial gastritis; Degeneration of intervertebral disc of lumbosacral region; Fibromyalgia; Insomnia; Migraine without aura and with status migrainosus, not intractable; Bilateral leg cramps; Obesity; GAD (generalized anxiety disorder); Fatigue; Atypical lymphocytosis; Vitamin D insufficiency; Panic disorder with agoraphobia; Depression, unspecified depression type; Chronic pain syndrome; Long term prescription opiate use; Opiate use; Long term prescription benzodiazepine use; Neurogenic pain; Chronic low back pain (Location of Primary Source of Pain) (Bilateral) (R>L) (midline); Chronic upper back pain (Location of Secondary source of pain) (Bilateral) (L>R); Chronic right lower quadrant pain; Thoracic radiculitis (Right) (T11 dermatome); Chronic occipital neuralgia (Location of Tertiary source of pain) (Bilateral) (L>R); Chronic neck pain; Chronic cervical  radicular pain (Bilateral) (L>R); Chronic shoulder blade pain (Bilateral) (L>R); Chronic upper extremity pain (Bilateral) (R>L); Chronic knee pain (Bilateral) (R>L); Chronic ankle pain, bilateral; Cervical spondylosis with myelopathy and radiculopathy; Nephrolithiasis; Controlled substance agreement signed; Plantar fasciitis of left foot; Vitamin B12 deficiency; Hyperlipidemia; Medication monitoring encounter; Intractable headache; Bilateral leg edema; Bright red rectal bleeding; Hypertriglyceridemia; Chronic left hip pain; Chronic left sacroiliac joint pain; Lumbar facet joint syndrome (B) (L>R); and Left lumbar radiculitis on her problem list. Her primarily concern today is the Back Pain (lower bilateral )  Pain Assessment: Self-Reported Pain Score: 2 /10             Reported level is compatible with observation.       Pain Type: Chronic pain Pain Location: Back (tailbone) Pain Orientation: Lower, Left, Right Pain Descriptors / Indicators: Radiating, Tightness, Aching, Pressure, Constant Pain Frequency: Constant  Ms. Schwall was last seen on 10/30/2016 for a procedure. During today's appointment we reviewed Dana Bradley's post-procedure results, as well as her outpatient medication regimen.  Further details on both, my assessment(s), as well as the proposed treatment plan, please see below.  Controlled Substance Pharmacotherapy Assessment REMS (Risk Evaluation and Mitigation Strategy)  Analgesic: Otovent/APAP 7.5/325 one daily MME/day: 7.5 mg/day.  Janett Billow, RN  12/24/2016 11:34 AM  Sign at close encounter Nursing Pain Medication Assessment:  Safety precautions to be maintained throughout the outpatient stay will include: orient to surroundings, keep bed in low position, maintain call bell within reach at all times, provide assistance with transfer out of bed and ambulation.  Medication Inspection Compliance: Pill count conducted under aseptic conditions, in front of the patient. Neither  the pills nor the bottle was removed from the patient's sight at any time. Once count was completed pills were immediately returned to the patient in their original bottle.  Medication: See above Pill/Patch Count: 2 of 30 pills remain Bottle Appearance: Standard pharmacy container. Clearly labeled. Filled Date: 67 / 20 /  2017 Last Medication intake:  Today   Pharmacokinetics: Liberation and absorption (onset of action): WNL Distribution (time to peak effect): WNL Metabolism and excretion (duration of action): WNL         Pharmacodynamics: Desired effects: Analgesia: Dana Bradley reports >50% benefit. Functional ability: Patient reports that medication allows her to accomplish basic ADLs Clinically meaningful improvement in function (CMIF): Sustained CMIF goals met Perceived effectiveness: Described as relatively effective, allowing for increase in activities of daily living (ADL) Undesirable effects: Side-effects or Adverse reactions: None reported Monitoring: [place] PMP: Online review of the past 80-monthperiod conducted. Compliant with practice rules and regulations List of all UDS test(s) done:  Lab Results  Component Value Date   SUMMARY FINAL 08/07/2016   Last UDS on record: No results found for: TOXASSSELUR UDS interpretation: Compliant          Medication Assessment Form: Reviewed. Patient indicates being compliant with therapy Treatment compliance: Compliant Risk Assessment Profile: Aberrant behavior: See prior evaluations. None observed or detected today Comorbid factors increasing risk of overdose: See prior notes. No additional risks detected today Risk of substance use disorder (SUD): Low Opioid Risk Tool (ORT) Total Score: 4  Interpretation Table:  Score <3 = Low Risk for SUD  Score between 4-7 = Moderate Risk for SUD  Score >8 = High Risk for Opioid Abuse   Risk Mitigation Strategies:  Patient Counseling: Covered Patient-Prescriber Agreement (PPA): Present and  active  Notification to other healthcare providers: Done  Pharmacologic Plan: No change in therapy, at this time  Post-Procedure Assessment  10/30/2016 Procedure: Left cervical epidural steroid injection #2 under fluoroscopic guidance, no sedation Pre-procedure pain score:  2/10 Post-procedure pain score: 1/10 50% Influential Factors: BMI: 38.26 kg/m Intra-procedural challenges: None observed Assessment challenges: None detected         Post-procedural side-effects, adverse reactions, or complications: None reported Reported issues: None  Sedation: Sedation provided. When no sedatives are used, the analgesic levels obtained are directly associated to the effectiveness of the local anesthetics. However, when sedation is provided, the level of analgesia obtained during the initial 1 hour following the intervention, is believed to be the result of a combination of factors. These factors may include, but are not limited to: 1. The effectiveness of the local anesthetics used. 2. The effects of the analgesic(s) and/or anxiolytic(s) used. 3. The degree of discomfort experienced by the patient at the time of the procedure. 4. The patients ability and reliability in recalling and recording the events. 5. The presence and influence of possible secondary gains and/or psychosocial factors. Reported result: Relief experienced during the 1st hour after the procedure: 80 % (Ultra-Short Term Relief) Interpretative annotation: No Analgesic or Anxiolytic given, therefore benefits are completely due to Local Anesthetics.          Effects of local anesthetic: The analgesic effects attained during this period are directly associated to the localized infiltration of local anesthetics and therefore cary significant diagnostic value as to the etiological location, or anatomical origin, of the pain. Expected duration of relief is directly dependent on the pharmacodynamics of the local anesthetic used. Long-acting  (4-6 hours) anesthetics used.  Reported result: Relief during the next 4 to 6 hour after the procedure: 90 % (Short-Term Relief) Interpretative annotation: Complete relief would suggest area to be the source of the pain.          Long-term benefit: Defined as the period of time past the expected duration of local anesthetics. With the possible exception  of prolonged sympathetic blockade from the local anesthetics, benefits during this period are typically attributed to, or associated with, other factors such as analgesic sensory neuropraxia, antiinflammatory effects, or beneficial biochemical changes provided by agents other than the local anesthetics Reported result: Extended relief following procedure: 80 % (patient states headaches are better and not as frequent as they uised to be.  shorer duration and less intense. ) (Long-Term Relief) Interpretative annotation: Good relief. This could suggest inflammation to be a significant component in the etiology to the pain.          Current benefits: Defined as persistent relief that continues at this point in time.   Reported results: Treated area: >50 % Ms. Yearsley reports improvement in function Interpretative annotation: Ongoing benefits would suggest effective therapeutic approach  Interpretation: Results would suggest a successful diagnostic intervention.          Laboratory Chemistry  Inflammation Markers Lab Results  Component Value Date   CRP 1.4 (H) 08/08/2016   ESRSEDRATE 14 08/08/2016   (CRP: Acute Phase) (ESR: Chronic Phase) Renal Function Markers Lab Results  Component Value Date   BUN 10 08/08/2016   CREATININE 0.77 08/08/2016   GFRAA >60 08/08/2016   GFRNONAA >60 08/08/2016   Hepatic Function Markers Lab Results  Component Value Date   AST 25 08/08/2016   ALT 23 11/27/2016   ALBUMIN 4.1 08/08/2016   ALKPHOS 82 08/08/2016   Electrolytes Lab Results  Component Value Date   NA 141 08/08/2016   K 4.1 08/08/2016   CL  107 08/08/2016   CALCIUM 9.4 08/08/2016   MG 2.2 08/08/2016   Neuropathy Markers Lab Results  Component Value Date   VITAMINB12 183 08/08/2016   Bone Pathology Markers Lab Results  Component Value Date   ALKPHOS 82 08/08/2016   VD25OH 17 (L) 04/29/2016   25OHVITD1 25 (L) 08/08/2016   25OHVITD2 15 08/08/2016   25OHVITD3 10 08/08/2016   CALCIUM 9.4 08/08/2016   Coagulation Parameters Lab Results  Component Value Date   INR 1.01 04/23/2010   LABPROT 13.2 04/23/2010   PLT 267 04/29/2016   Cardiovascular Markers Lab Results  Component Value Date   HGB 13.8 04/29/2016   HCT 40.0 04/29/2016   Note: Lab results reviewed.  Recent Diagnostic Imaging Review  Dg C-arm 1-60 Min-no Report  Result Date: 10/30/2016 Fluoroscopy was utilized by the requesting physician.  No radiographic interpretation.   Note: Imaging results reviewed.          Meds  The patient has a current medication list which includes the following prescription(s): alprazolam, amitriptyline, atorvastatin, calcium-vitamin d-vitamin k, cyanocobalamin, hydrocodone-acetaminophen, lamotrigine, magnesium, metoprolol, omega-3 acid ethyl esters, pregabalin, and tizanidine.  Current Outpatient Prescriptions on File Prior to Visit  Medication Sig  . ALPRAZolam (XANAX) 1 MG tablet Take 1 tablet (1 mg total) by mouth at bedtime as needed for anxiety.  Marland Kitchen amitriptyline (ELAVIL) 10 MG tablet Take 10 mg by mouth as needed.  Marland Kitchen atorvastatin (LIPITOR) 10 MG tablet Take 1 tablet (10 mg total) by mouth at bedtime. Every other day  . Calcium-Vitamin D-Vitamin K 595-638-75 MG-UNT-MCG TABS Take by mouth.  . Cyanocobalamin (VITAMIN B-12 PO) Take 1,500 mcg by mouth daily.  Marland Kitchen lamoTRIgine (LAMICTAL) 100 MG tablet Take 1 tablet (100 mg total) by mouth daily. (note new instructions and strength) (Patient taking differently: Take 100 mg by mouth 2 (two) times daily. (note new instructions and strength))  . magnesium 30 MG tablet Take 30 mg  by mouth  daily.   . metoprolol (LOPRESSOR) 50 MG tablet Take 1 tablet (50 mg total) by mouth 2 (two) times daily.  Marland Kitchen omega-3 acid ethyl esters (LOVAZA) 1 g capsule Take 2 capsules (2 g total) by mouth 2 (two) times daily.   No current facility-administered medications on file prior to visit.    ROS  Constitutional: Denies any fever or chills Gastrointestinal: No reported hemesis, hematochezia, vomiting, or acute GI distress Musculoskeletal: Denies any acute onset joint swelling, redness, loss of ROM, or weakness Neurological: No reported episodes of acute onset apraxia, aphasia, dysarthria, agnosia, amnesia, paralysis, loss of coordination, or loss of consciousness  Allergies  Ms. Muriel is allergic to aspirin; cymbalta [duloxetine hcl]; demerol [meperidine]; depakote [divalproex sodium]; haloperidol; iodinated diagnostic agents; reglan [metoclopramide]; tramadol hcl; trazodone; compazine [prochlorperazine]; meloxicam; penicillins; tomato; bacitracin-neomycin-polymyxin; cephalosporins; ibuprofen; latex; neosporin [neomycin-bacitracin zn-polymyx]; nsaids; and sulfonamide derivatives.  PFSH  Drug: Ms. Bellisario  has no drug history on file. Alcohol:  reports that she does not drink alcohol. Tobacco:  reports that she quit smoking about 24 years ago. Her smoking use included Cigarettes. She has a 12.00 pack-year smoking history. She has never used smokeless tobacco. Medical:  has a past medical history of Bursitis; Edema leg (05/02/2015); Fibromyalgia; GERD (gastroesophageal reflux disease); IBS (irritable bowel syndrome); Knee pain, bilateral (12/21/2008); Lumbar discitis; Osteoarthritis; Right hand pain (04/10/2015); Sleep apnea; SVT (supraventricular tachycardia) ([place]); Vertigo; and Vitamin D deficiency (05/01/2016). Family: family history includes Alcohol abuse in her father; Alzheimer's disease in her paternal grandmother; Aneurysm in her maternal grandfather; Arthritis in her maternal grandmother;  Bipolar disorder in her sister; COPD in her paternal grandfather; Cancer in her mother; Cancer (age of onset: 21) in her maternal grandmother; Depression in her father, mother, sister, and sister; Diabetes in her sister; Heart attack in her paternal grandfather; Heart disease in her father, maternal grandfather, and paternal grandfather; Hyperlipidemia in her maternal grandmother, mother, and sister; Hypertension in her father, maternal grandfather, mother, paternal grandfather, sister, and sister; Polycystic ovary syndrome in her sister; Stroke in her father; Thyroid disease in her maternal grandmother.  Past Surgical History:  Procedure Laterality Date  . ABLATION     Uterine  . CARDIAC CATHETERIZATION    . COLONOSCOPY WITH PROPOFOL N/A 05/17/2015   Procedure: COLONOSCOPY WITH PROPOFOL;  Surgeon: Manya Silvas, MD;  Location: [facility] ENDOSCOPY;  Service: Endoscopy;  Laterality: N/A;  . ESOPHAGOGASTRODUODENOSCOPY N/A 05/17/2015   Procedure: ESOPHAGOGASTRODUODENOSCOPY (EGD);  Surgeon: Manya Silvas, MD;  Location: [facility] ENDOSCOPY;  Service: Endoscopy;  Laterality: N/A;  . KNEE ARTHROSCOPY    . TUBAL LIGATION  10/01/99   Constitutional Exam  General appearance: Well nourished, well developed, and well hydrated. In no apparent acute distress Vitals:   12/24/16 1121  BP: 108/74  Pulse: 76  Resp: 16  Temp: 98.2 F (36.8 C)  TempSrc: Oral  SpO2: 97%  Weight: 216 lb (98 kg)  Height: _0  (1.6 m)   BMI Assessment: Estimated body mass index is 38.26 kg/m as calculated from the following:   Height as of this encounter: _1  (1.6 m).   Weight as of this encounter: 216 lb (98 kg).  BMI interpretation table: BMI level Category Range association with higher incidence of chronic pain  <18 kg/m2 Underweight   18.5-24.9 kg/m2 Ideal body weight   25-29.9 kg/m2 Overweight Increased incidence by 20%  30-34.9 kg/m2 Obese (Class I) Increased incidence by 68%  35-39.9 kg/m2 Severe obesity (Class  II) Increased incidence by 136%  >  40 kg/m2 Extreme obesity (Class III) Increased incidence by 254%   BMI Readings from Last 4 Encounters:  12/24/16 38.26 kg/m  11/27/16 37.27 kg/m  10/30/16 36.85 kg/m  10/23/16 37.20 kg/m   Wt Readings from Last 4 Encounters:  12/24/16 216 lb (98 kg)  11/27/16 210 lb 6.4 oz (95.4 kg)  10/30/16 208 lb (94.3 kg)  10/23/16 210 lb (95.3 kg)  Psych/Mental status: Alert, oriented x 3 (person, place, & time)       Eyes: PERLA Respiratory: No evidence of acute respiratory distress  Cervical Spine Exam  Inspection: No masses, redness, or swelling Alignment: Symmetrical Functional ROM: Unrestricted ROM Stability: No instability detected Muscle strength & Tone: Functionally intact Sensory: Unimpaired Palpation: No palpable anomalies  Upper Extremity (UE) Exam    Side: Right upper extremity  Side: Left upper extremity  Inspection: No masses, redness, swelling, or asymmetry. No contractures  Inspection: No masses, redness, swelling, or asymmetry. No contractures  Functional ROM: Unrestricted ROM          Functional ROM: Unrestricted ROM          Muscle strength & Tone: Functionally intact  Muscle strength & Tone: Functionally intact  Sensory: Unimpaired  Sensory: Unimpaired  Palpation: No palpable anomalies  Palpation: No palpable anomalies  Specialized Test(s): Deferred         Specialized Test(s): Deferred          Thoracic Spine Exam  Inspection: No masses, redness, or swelling Alignment: Symmetrical Functional ROM: Unrestricted ROM Stability: No instability detected Sensory: Unimpaired Muscle strength & Tone: No palpable anomalies  Lumbar Spine Exam  Inspection: No masses, redness, or swelling Alignment: Symmetrical Functional ROM: Unrestricted ROM Stability: No instability detected Muscle strength & Tone: Functionally intact Sensory: Unimpaired Palpation: No palpable anomalies Provocative Tests: Lumbar Hyperextension and rotation  test: evaluation deferred today       Patrick's Maneuver: evaluation deferred today              Gait & Posture Assessment  Ambulation: Unassisted Gait: Relatively normal for age and body habitus Posture: WNL   Lower Extremity Exam    Side: Right lower extremity  Side: Left lower extremity  Inspection: No masses, redness, swelling, or asymmetry. No contractures  Inspection: No masses, redness, swelling, or asymmetry. No contractures  Functional ROM: Unrestricted ROM          Functional ROM: Unrestricted ROM          Muscle strength & Tone: Functionally intact  Muscle strength & Tone: Functionally intact  Sensory: Unimpaired  Sensory: Unimpaired  Palpation: No palpable anomalies  Palpation: No palpable anomalies   Assessment  Primary Diagnosis & Pertinent Problem List: The primary encounter diagnosis was Chronic low back pain (Location of Primary Source of Pain) (Bilateral) (R>L) (midline). Diagnoses of Chronic upper back pain (Location of Secondary source of pain) (Bilateral) (L>R), Chronic occipital neuralgia (Location of Tertiary source of pain) (Bilateral) (L>R), Chronic pain syndrome, Long term prescription opiate use, Opiate use, Fibromyalgia affecting multiple sites, Fibromyalgia, Muscle spasticity, Chronic cervical radicular pain (Bilateral) (L>R), Chronic left hip pain, Chronic left sacroiliac joint pain, Lumbar facet joint syndrome, and Left lumbar radiculitis were also pertinent to this visit.  Status Diagnosis  Controlled Controlled Controlled 1. Chronic low back pain (Location of Primary Source of Pain) (Bilateral) (R>L) (midline)   2. Chronic upper back pain (Location of Secondary source of pain) (Bilateral) (L>R)   3. Chronic occipital neuralgia (Location of Tertiary source of pain) (Bilateral) (L>R)  4. Chronic pain syndrome   5. Long term prescription opiate use   6. Opiate use   7. Fibromyalgia affecting multiple sites   8. Fibromyalgia   9. Muscle spasticity   10.  Chronic cervical radicular pain (Bilateral) (L>R)   11. Chronic left hip pain   12. Chronic left sacroiliac joint pain   13. Lumbar facet joint syndrome   14. Left lumbar radiculitis      Plan of Care  Pharmacotherapy (Medications Ordered): Meds ordered this encounter  Medications  . HYDROcodone-acetaminophen (NORCO) 7.5-325 MG tablet    Sig: Take 1 tablet by mouth every 6 (six) hours as needed for moderate pain.    Dispense:  30 tablet    Refill:  0    Do not place this medication, or any other prescription from our practice, on "Automatic Refill". Patient may have prescription filled one day early if pharmacy is closed on scheduled refill date. Do not fill until: 12/24/16 To last until: 03/24/17  . pregabalin (LYRICA) 150 MG capsule    Sig: Take 1 capsule (150 mg total) by mouth every 8 (eight) hours.    Dispense:  90 capsule    Refill:  2    Do not add this medication to the electronic "Automatic Refill" notification system. Patient may have prescription filled one day early if pharmacy is closed on scheduled refill date.  Marland Kitchen tiZANidine (ZANAFLEX) 2 MG tablet    Sig: Take 1 tablet (2 mg total) by mouth every 6 (six) hours as needed for muscle spasms.    Dispense:  120 tablet    Refill:  2    Do not add this medication to the electronic "Automatic Refill" notification system. Patient may have prescription filled one day early if pharmacy is closed on scheduled refill date.   New Prescriptions   No medications on file   Medications administered today: Ms. Bischoff had no medications administered during this visit. Lab-work, procedure(s), and/or referral(s): Orders Placed This Encounter  Procedures  . LUMBAR FACET(MEDIAL BRANCH NERVE BLOCK) MBNB  . SACROILIAC JOINT INJECTINS  . MR LUMBAR SPINE WO CONTRAST  . DG HIP UNILAT W OR W/O PELVIS 2-3 VIEWS LEFT  . DG Si Joints   Imaging and/or referral(s): MR LUMBAR SPINE WO CONTRAST  Interventional therapies: Planned, scheduled,  and/or pending:   Diagnostic bilateral lumbar facet + left SI joint block under fluoro and IV sedation.   Considering:   Therapeutic left cervical epidural steroid injection #3  Palliative left L4-5 lumbar epidural steroid injections  Diagnostic bilateral lumbar facet block  Possible bilateral lumbar facet radiofrequency ablation. Diagnostic thoracic facet block  Possible bilateral thoracic facet radiofrequency ablation. Left cervical epidural steroid injections  Diagnostic bilateral cervical facet blocks  Possible bilateral cervical facet radiofrequency ablation. Diagnostic bilateral lesser occipital nerve blocks  Diagnostic bilateral C2 and TON nerve block  Possible bilateral occipital nerve radiofrequency ablation. Diagnostic right T10-11 thoracic epidural steroid injection  Diagnostic bilateral suprascapular nerve block  Diagnostic bilateral intra-articular knee joint injection  Possible bilateral intra-articular Hyalgan knee injections.  Diagnostic bilateral Genicular nerve block  Possible bilateral Genicular nerve radiofrequency ablation.    Palliative PRN treatment(s):   Palliative left L4-5 lumbar epidural steroid injections  Diagnostic bilateral lumbar facet block  Diagnostic thoracic facet block  Left cervical epidural steroid injections  Diagnostic bilateral cervical facet blocks  Diagnostic bilateral lesser occipital nerve blocks  Diagnostic bilateral C2 and TON nerve block  Diagnostic right T10-11 thoracic epidural steroid injection  Diagnostic  bilateral suprascapular nerve block  Diagnostic bilateral intra-articular knee joint injection  Diagnostic bilateral Genicular nerve block    Provider-requested follow-up: Return in about 3 months (around 03/26/2017) for (MD) Med-Mgmt, in addition, Procedure in 2 weeks..  Future Appointments Date Time Provider Lupus  01/06/2017 9:00 AM Milinda Pointer, MD [facility] None  03/27/2017 10:00 AM Milinda Pointer,  MD [facility] None   Primary Care Physician: Arnetha Courser, MD Location: [facility] Out[facility] Management Facility Note by: Kathlen Brunswick. Dossie Arbour, M.D, DABA, DABAPM, DABPM, DABIPP, FIPP Date: 12/24/2016; Time: 3:58 PM  Pain Score Disclaimer: We use the NRS-11 scale. This is a self-reported, subjective measurement of pain severity with only modest accuracy. It is used primarily to identify changes within a particular patient. It must be understood that outpatient pain scales are significantly less accurate that those used for research, where they can be applied under ideal controlled circumstances with minimal exposure to variables. In reality, the score is likely to be a combination of pain intensity and pain affect, where pain affect describes the degree of emotional arousal or changes in action readiness caused by the sensory experience of pain. Factors such as social and work situation, setting, emotional state, anxiety levels, expectation, and prior pain experience may influence pain perception and show large inter-individual differences that may also be affected by time variables.  Patient instructions provided during this appointment: Patient Instructions   Preparing for Procedure with Sedation Instructions: . Oral Intake: Do not eat or drink anything for at least 8 hours prior to your procedure. . Transportation: Public transportation is not allowed. Bring an adult driver. The driver must be physically present in our waiting room before any procedure can be started. Marland Kitchen Physical Assistance: Bring an adult physically capable of assisting you, in the event you need help. This adult should keep you company at home for at least 6 hours after the procedure. . Blood Pressure Medicine: Take your blood pressure medicine with a sip of water the morning of the procedure. . Blood thinners:  . Diabetics on insulin: Notify the staff so that you can be scheduled 1st case in the morning. If your diabetes  requires high dose insulin, take only  of your normal insulin dose the morning of the procedure and notify the staff that you have done so. . Preventing infections: Shower with an antibacterial soap the morning of your procedure. . Build-up your immune system: Take 1000 mg of Vitamin C with every meal (3 times a day) the day prior to your procedure. Marland Kitchen Antibiotics: Inform the staff if you have a condition or reason that requires you to take antibiotics before dental procedures. . Pregnancy: If you are pregnant, call and cancel the procedure. . Sickness: If you have a cold, fever, or any active infections, call and cancel the procedure. . Arrival: You must be in the facility at least 30 minutes prior to your scheduled procedure. . Children: Do not bring children with you. . Dress appropriately: Bring dark clothing that you would not mind if they get stained. . Valuables: Do not bring any jewelry or valuables. Procedure appointments are reserved for interventional treatments only. Marland Kitchen No Prescription Refills. . No medication changes will be discussed during procedure appointments. . No disability issues will be discussed.  ____________________________________________________________________________________________  Sacroiliac (SI) Joint Injection Patient Information  Description: The sacroiliac joint connects the scrum (very low back and tailbone) to the ilium (a pelvic bone which also forms half of the hip joint).  Normally this  joint experiences very little motion.  When this joint becomes inflamed or unstable low back and or hip and pelvis pain may result.  Injection of this joint with local anesthetics (numbing medicines) and steroids can provide diagnostic information and reduce pain.  This injection is performed with the aid of x-ray guidance into the tailbone area while you are lying on your stomach.   You may experience an electrical sensation down the leg while this is being done.  You may  also experience numbness.  We also may ask if we are reproducing your normal pain during the injection.  Conditions which may be treated SI injection:   Low back, buttock, hip or leg pain  Preparation for the Injection:  1. Do not eat any solid food or dairy products within 8 hours of your appointment.  2. You may drink clear liquids up to 3 hours before appointment.  Clear liquids include water, black coffee, juice or soda.  No milk or cream please. 3. You may take your regular medications, including pain medications with a sip of water before your appointment.  Diabetics should hold regular insulin (if take separately) and take 1/2 normal NPH dose the morning of the procedure.  Carry some sugar containing items with you to your appointment. 4. A driver must accompany you and be prepared to drive you home after your procedure. 5. Bring all of your current medications with you. 6. An IV may be inserted and sedation may be given at the discretion of the physician. 7. A blood pressure cuff, EKG and other monitors will often be applied during the procedure.  Some patients may need to have extra oxygen administered for a short period.  8. You will be asked to provide medical information, including your allergies, prior to the procedure.  We must know immediately if you are taking blood thinners (like Coumadin/Warfarin) or if you are allergic to IV iodine contrast (dye).  We must know if you could possible be pregnant.  Possible side effects:   Bleeding from needle site  Infection (rare, may require surgery)  Nerve injury (rare)  Numbness & tingling (temporary)  A brief convulsion or seizure  Light-headedness (temporary)  Pain at injection site (several days)  Decreased blood pressure (temporary)  Weakness in the leg (temporary)   Call if you experience:   New onset weakness or numbness of an extremity below the injection site that last more than 8 hours.  Hives or difficulty  breathing ( go to the emergency room)  Inflammation or drainage at the injection site  Any new symptoms which are concerning to you  Please note:  Although the local anesthetic injected can often make your back/ hip/ buttock/ leg feel good for several hours after the injections, the pain will likely return.  It takes 3-7 days for steroids to work in the sacroiliac area.  You may not notice any pain relief for at least that one week.  If effective, we will often do a series of three injections spaced 3-6 weeks apart to maximally decrease your pain.  After the initial series, we generally will wait some months before a repeat injection of the same type.  If you have any questions, please call [phone] [place]  What are the risk, side effects and possible complications? Generally speaking, most procedures are safe.  However, with any procedure there are risks, side effects, and the possibility of complications.  The risks  and complications are dependent upon the sites that are lesioned, or the type of nerve block to be performed.  The closer the procedure is to the spine, the more serious the risks are.  Great care is taken when placing the radio frequency needles, block needles or lesioning probes, but sometimes complications can occur. 1. Infection: Any time there is an injection through the skin, there is a risk of infection.  This is why sterile conditions are used for these blocks.  There are four possible types of infection. 1. Localized skin infection. 2. Central Nervous System Infection-This can be in the form of Meningitis, which can be deadly. 3. Epidural Infections-This can be in the form of an epidural abscess, which can cause pressure inside of the spine, causing compression of the spinal cord with subsequent paralysis. This would require an emergency surgery to decompress, and there are no guarantees that the  patient would recover from the paralysis. 4. Discitis-This is an infection of the intervertebral discs.  It occurs in about 1% of discography procedures.  It is difficult to treat and it may lead to surgery.        2. Pain: the needles have to go through skin and soft tissues, will cause soreness.       3. Damage to internal structures:  The nerves to be lesioned may be near blood vessels or    other nerves which can be potentially damaged.       4. Bleeding: Bleeding is more common if the patient is taking blood thinners such as  aspirin, Coumadin, Ticiid, Plavix, etc., or if he/she have some genetic predisposition  such as hemophilia. Bleeding into the spinal canal can cause compression of the spinal  cord with subsequent paralysis.  This would require an emergency surgery to  decompress and there are no guarantees that the patient would recover from the  paralysis.       5. Pneumothorax:  Puncturing of a lung is a possibility, every time a needle is introduced in  the area of the chest or upper back.  Pneumothorax refers to free air around the  collapsed lung(s), inside of the thoracic cavity (chest cavity).  Another two possible  complications related to a similar event would include: Hemothorax and Chylothorax.   These are variations of the Pneumothorax, where instead of air around the collapsed  lung(s), you may have blood or chyle, respectively.       6. Spinal headaches: They may occur with any procedures in the area of the spine.       7. Persistent CSF (Cerebro-Spinal Fluid) leakage: This is a rare problem, but may occur  with prolonged intrathecal or epidural catheters either due to the formation of a fistulous  track or a dural tear.       8. Nerve damage: By working so close to the spinal cord, there is always a possibility of  nerve damage, which could be as serious as a permanent spinal cord injury with  paralysis.       9. Death:  Although rare, severe deadly allergic reactions known as  "Anaphylactic  reaction" can occur to any of the medications used.      10. Worsening of the symptoms:  We can always make thing worse.  What are the chances of something like this happening? Chances of any of this occuring are extremely low.  By statistics, you have more of a chance of getting killed in a motor vehicle accident: while  driving to the hospital than any of the above occurring .  Nevertheless, you should be aware that they are possibilities.  In general, it is similar to taking a shower.  Everybody knows that you can slip, hit your head and get killed.  Does that mean that you should not shower again?  Nevertheless always keep in mind that statistics do not mean anything if you happen to be on the wrong side of them.  Even if a procedure has a 1 (one) in a 1,000,000 (million) chance of going wrong, it you happen to be that one..Also, keep in mind that by statistics, you have more of a chance of having something go wrong when taking medications.  Who should not have this procedure? If you are on a blood thinning medication (e.g. Coumadin, Plavix, see list of "Blood Thinners"), or if you have an active infection going on, you should not have the procedure.  If you are taking any blood thinners, please inform your physician.  How should I prepare for this procedure?  Do not eat or drink anything at least six hours prior to the procedure.  Bring a driver with you .  It cannot be a taxi.  Come accompanied by an adult that can drive you back, and that is strong enough to help you if your legs get weak or numb from the local anesthetic.  Take all of your medicines the morning of the procedure with just enough water to swallow them.  If you have diabetes, make sure that you are scheduled to have your procedure done first thing in the morning, whenever possible.  If you have diabetes, take only half of your insulin dose and notify our nurse that you have done so as soon as you arrive at the  clinic.  If you are diabetic, but only take blood sugar pills (oral hypoglycemic), then do not take them on the morning of your procedure.  You may take them after you have had the procedure.  Do not take aspirin or any aspirin-containing medications, at least eleven (11) days prior to the procedure.  They may prolong bleeding.  Wear loose fitting clothing that may be easy to take off and that you would not mind if it got stained with Betadine or blood.  Do not wear any jewelry or perfume  Remove any nail coloring.  It will interfere with some of our monitoring equipment.  NOTE: Remember that this is not meant to be interpreted as a complete list of all possible complications.  Unforeseen problems may occur.  BLOOD THINNERS The following drugs contain aspirin or other products, which can cause increased bleeding during surgery and should not be taken for 2 weeks prior to and 1 week after surgery.  If you should need take something for relief of minor pain, you may take acetaminophen which is found in Tylenol,m Datril, Anacin-3 and Panadol. It is not blood thinner. The products listed below are.  Do not take any of the products listed below in addition to any listed on your instruction sheet.  A.P.C or A.P.C with Codeine Codeine Phosphate Capsules #3 Ibuprofen Ridaura  ABC compound Congesprin Imuran rimadil  Advil Cope Indocin Robaxisal  Alka-Seltzer Effervescent Pain Reliever and Antacid Coricidin or Coricidin-D  Indomethacin Rufen  Alka-Seltzer plus Cold Medicine Cosprin Ketoprofen S-A-C Tablets  Anacin Analgesic Tablets or Capsules Coumadin Korlgesic Salflex  Anacin Extra Strength Analgesic tablets or capsules CP-2 Tablets Lanoril Salicylate  Anaprox Cuprimine Capsules Levenox Salocol  Anexsia-D  Dalteparin Magan Salsalate  Anodynos Darvon compound Magnesium Salicylate Sine-off  Ansaid Dasin Capsules Magsal Sodium Salicylate  Anturane Depen Capsules Marnal Soma  APF Arthritis pain  formula Dewitt's Pills Measurin Stanback  Argesic Dia-Gesic Meclofenamic Sulfinpyrazone  Arthritis Bayer Timed Release Aspirin Diclofenac Meclomen Sulindac  Arthritis pain formula Anacin Dicumarol Medipren Supac  Analgesic (Safety coated) Arthralgen Diffunasal Mefanamic Suprofen  Arthritis Strength Bufferin Dihydrocodeine Mepro Compound Suprol  Arthropan liquid Dopirydamole Methcarbomol with Aspirin Synalgos  ASA tablets/Enseals Disalcid Micrainin Tagament  Ascriptin Doan's Midol Talwin  Ascriptin A/D Dolene Mobidin Tanderil  Ascriptin Extra Strength Dolobid Moblgesic Ticlid  Ascriptin with Codeine Doloprin or Doloprin with Codeine Momentum Tolectin  Asperbuf Duoprin Mono-gesic Trendar  Aspergum Duradyne Motrin or Motrin IB Triminicin  Aspirin plain, buffered or enteric coated Durasal Myochrisine Trigesic  Aspirin Suppositories Easprin Nalfon Trillsate  Aspirin with Codeine Ecotrin Regular or Extra Strength Naprosyn Uracel  Atromid-S Efficin Naproxen Ursinus  Auranofin Capsules Elmiron Neocylate Vanquish  Axotal Emagrin Norgesic Verin  Azathioprine Empirin or Empirin with Codeine Normiflo Vitamin E  Azolid Emprazil Nuprin Voltaren  Bayer Aspirin plain, buffered or children's or timed BC Tablets or powders Encaprin Orgaran Warfarin Sodium  Buff-a-Comp Enoxaparin Orudis Zorpin  Buff-a-Comp with Codeine Equegesic Os-Cal-Gesic   Buffaprin Excedrin plain, buffered or Extra Strength Oxalid   Bufferin Arthritis Strength Feldene Oxphenbutazone   Bufferin plain or Extra Strength Feldene Capsules Oxycodone with Aspirin   Bufferin with Codeine Fenoprofen Fenoprofen Pabalate or Pabalate-SF   Buffets II Flogesic Panagesic   Buffinol plain or Extra Strength Florinal or Florinal with Codeine Panwarfarin   Buf-Tabs Flurbiprofen Penicillamine   Butalbital Compound Four-way cold tablets Penicillin   Butazolidin Fragmin Pepto-Bismol   Carbenicillin Geminisyn Percodan   Carna Arthritis Reliever Geopen  Persantine   Carprofen Gold's salt Persistin   Chloramphenicol Goody's Phenylbutazone   Chloromycetin Haltrain Piroxlcam   Clmetidine heparin Plaquenil   Cllnoril Hyco-pap Ponstel   Clofibrate Hydroxy chloroquine Propoxyphen         Before stopping any of these medications, be sure to consult the physician who ordered them.  Some, such as Coumadin (Warfarin) are ordered to prevent or treat serious conditions such as "deep thrombosis", "pumonary embolisms", and other heart problems.  The amount of time that you may need off of the medication may also vary with the medication and the reason for which you were taking it.  If you are taking any of these medications, please make sure you notify your pain physician before you undergo any procedures.          Facet Joint Block The facet joints connect the bones of the spine (vertebrae). They make it possible for you to bend, twist, and make other movements with your spine. They also keep you from bending too far, twisting too far, and making other excessive movements. A facet joint block is a procedure where a numbing medicine (anesthetic) is injected into a facet joint. Often, a type of anti-inflammatory medicine called a steroid is also injected. A facet joint block may be done to diagnose neck or back pain. If the pain gets better after a facet joint block, it means the pain is probably coming from the facet joint. If the pain does not get better, it means the pain is probably not coming from the facet joint. A facet joint block may also be done to relieve neck or back pain caused by an inflamed facet joint. A facet joint block is only done to relieve pain if the  pain does not improve with other methods, such as medicine, exercise programs, and physical therapy. Tell a health care provider about: Any allergies you have. All medicines you are taking, including vitamins, herbs, eye drops, creams, and over-the-counter medicines. Any problems you or  family members have had with anesthetic medicines. Any blood disorders you have. Any surgeries you have had. Any medical conditions you have. Whether you are pregnant or may be pregnant. What are the risks? Generally, this is a safe procedure. However, problems may occur, including: Bleeding. Injury to a nerve near the injection site. Pain at the injection site. Weakness or numbness in areas controlled by nerves near the injection site. Infection. Temporary fluid retention. Allergic reactions to medicines or dyes. Injury to other structures or organs near the injection site. What happens before the procedure? Follow instructions from your health care provider about eating or drinking restrictions. Ask your health care provider about: Changing or stopping your regular medicines. This is especially important if you are taking diabetes medicines or blood thinners. Taking medicines such as aspirin and ibuprofen. These medicines can thin your blood. Do not take these medicines before your procedure if your health care provider instructs you not to. Do not take any new dietary supplements or medicines without asking your health care provider first. Plan to have someone take you home after the procedure. What happens during the procedure? You may need to remove your clothing and dress in an open-back gown. The procedure will be done while you are lying on an X-ray table. You will most likely be asked to lie on your stomach, but you may be asked to lie in a different position if an injection will be made in your neck. Machines will be used to monitor your oxygen levels, heart rate, and blood pressure. If an injection will be made in your neck, an IV tube will be inserted into one of your veins. Fluids and medicine will flow directly into your body through the IV tube. The area over the facet joint where the injection will be made will be cleaned with soap. The surrounding skin will be covered with  clean drapes. A numbing medicine (local anesthetic) will be applied to your skin. Your skin may sting or burn for a moment. A video X-ray machine (fluoroscopy) will be used to locate the joint. In some cases, a CT scan may be used. A contrast dye may be injected into the facet joint area to help locate the joint. When the joint is located, an anesthetic will be injected into the joint through the needle. Your health care provider will ask you whether you feel pain relief. If you do feel relief, a steroid may be injected to provide pain relief for a longer period of time. If you do not feel relief or feel only partial relief, additional injections of an anesthetic may be made in other facet joints. The needle will be removed. Your skin will be cleaned. A bandage (dressing) will be applied over each injection site. The procedure may vary among health care providers and hospitals. What happens after the procedure? You will be observed for 15-30 minutes before being allowed to go home. This information is not intended to replace advice given to you by your health care provider. Make sure you discuss any questions you have with your health care provider. Document Released: 02/05/2007 Document Revised: 10/18/2015 Document Reviewed: 06/12/2015 Elsevier Interactive Patient Education  2017 [employer].

## 2016-12-24 NOTE — Progress Notes (Signed)
Nursing Pain Medication Assessment:  Safety precautions to be maintained throughout the outpatient stay will include: orient to surroundings, keep bed in low position, maintain call bell within reach at all times, provide assistance with transfer out of bed and ambulation.  Medication Inspection Compliance: Pill count conducted under aseptic conditions, in front of the patient. Neither the pills nor the bottle was removed from the patient's sight at any time. Once count was completed pills were immediately returned to the patient in their original bottle.  Medication: See above Pill/Patch Count: 2 of 30 pills remain Bottle Appearance: Standard pharmacy container. Clearly labeled. Filled Date: 76 / 20 / 2017 Last Medication intake:  Today

## 2017-01-06 ENCOUNTER — Ambulatory Visit
Admission: RE | Admit: 2017-01-06 | Discharge: 2017-01-06 | Disposition: A | Discharge status: Home / Self Care | Payer: BLUE CROSS/BLUE SHIELD | Source: Ambulatory Visit | Attending: Pain Medicine | Admitting: Pain Medicine

## 2017-01-06 ENCOUNTER — Encounter: Payer: Self-pay | Admitting: Pain Medicine

## 2017-01-06 ENCOUNTER — Ambulatory Visit (HOSPITAL_BASED_OUTPATIENT_CLINIC_OR_DEPARTMENT_OTHER): Payer: BLUE CROSS/BLUE SHIELD | Admitting: Pain Medicine

## 2017-01-06 VITALS — BP 124/62 | HR 79 | Temp 99.0°F | Resp 19 | Ht 63.0 in | Wt 216.0 lb

## 2017-01-06 DIAGNOSIS — Z885 Allergy status to narcotic agent status: Secondary | ICD-10-CM | POA: Insufficient documentation

## 2017-01-06 DIAGNOSIS — M533 Sacrococcygeal disorders, not elsewhere classified: Secondary | ICD-10-CM | POA: Insufficient documentation

## 2017-01-06 DIAGNOSIS — M545 Low back pain: Secondary | ICD-10-CM | POA: Insufficient documentation

## 2017-01-06 DIAGNOSIS — M47816 Spondylosis without myelopathy or radiculopathy, lumbar region: Secondary | ICD-10-CM | POA: Insufficient documentation

## 2017-01-06 DIAGNOSIS — G8929 Other chronic pain: Secondary | ICD-10-CM | POA: Diagnosis not present

## 2017-01-06 DIAGNOSIS — Z888 Allergy status to other drugs, medicaments and biological substances status: Secondary | ICD-10-CM | POA: Insufficient documentation

## 2017-01-06 DIAGNOSIS — M5442 Lumbago with sciatica, left side: Secondary | ICD-10-CM

## 2017-01-06 DIAGNOSIS — Z882 Allergy status to sulfonamides status: Secondary | ICD-10-CM | POA: Insufficient documentation

## 2017-01-06 DIAGNOSIS — Z880 Allergy status to penicillin: Secondary | ICD-10-CM | POA: Diagnosis not present

## 2017-01-06 DIAGNOSIS — M1288 Other specific arthropathies, not elsewhere classified, other specified site: Secondary | ICD-10-CM | POA: Diagnosis not present

## 2017-01-06 DIAGNOSIS — Z91041 Radiographic dye allergy status: Secondary | ICD-10-CM | POA: Insufficient documentation

## 2017-01-06 DIAGNOSIS — M488X6 Other specified spondylopathies, lumbar region: Secondary | ICD-10-CM | POA: Insufficient documentation

## 2017-01-06 DIAGNOSIS — M5441 Lumbago with sciatica, right side: Secondary | ICD-10-CM

## 2017-01-06 DIAGNOSIS — Z886 Allergy status to analgesic agent status: Secondary | ICD-10-CM | POA: Insufficient documentation

## 2017-01-06 DIAGNOSIS — Z91040 Latex allergy status: Secondary | ICD-10-CM | POA: Diagnosis not present

## 2017-01-06 MED ORDER — LIDOCAINE HCL (PF) 1 % IJ SOLN: 10.0000 mL | Freq: Once | INTRAMUSCULAR | Status: DC

## 2017-01-06 MED ORDER — LACTATED RINGERS IV SOLN
1000.0000 mL | Freq: Once | INTRAVENOUS | Status: AC
  Administered 2017-01-06: 1000 mL via INTRAVENOUS

## 2017-01-06 MED ORDER — ROPIVACAINE HCL 5 MG/ML IJ SOLN
4.0000 mL | Freq: Once | INTRAMUSCULAR | Status: AC
  Administered 2017-01-06: 4 mL via INTRA_ARTICULAR
  Filled 2017-01-06: qty 4

## 2017-01-06 MED ORDER — TRIAMCINOLONE ACETONIDE 40 MG/ML IJ SUSP
40.0000 mg | Freq: Once | INTRAMUSCULAR | Status: AC
  Administered 2017-01-06: 40 mg
  Filled 2017-01-06: qty 1

## 2017-01-06 MED ORDER — ROPIVACAINE HCL 2 MG/ML IJ SOLN
9.0000 mL | Freq: Once | INTRAMUSCULAR | Status: AC
Start: 2017-01-06 — End: 2017-01-06
  Administered 2017-01-06: 9 mL via PERINEURAL
  Filled 2017-01-06: qty 10

## 2017-01-06 MED ORDER — FENTANYL CITRATE (PF) 100 MCG/2ML IJ SOLN
25.0000 ug | INTRAMUSCULAR | Status: DC | PRN
  Administered 2017-01-06: 100 ug via INTRAVENOUS
  Filled 2017-01-06: qty 2

## 2017-01-06 MED ORDER — MIDAZOLAM HCL 5 MG/5ML IJ SOLN
1.0000 mg | INTRAMUSCULAR | Status: DC | PRN
  Administered 2017-01-06: 3 mg via INTRAVENOUS
  Filled 2017-01-06: qty 5

## 2017-01-06 MED ORDER — TRIAMCINOLONE ACETONIDE 40 MG/ML IJ SUSP
40.0000 mg | Freq: Once | INTRAMUSCULAR | Status: AC
  Administered 2017-01-06: 40 mg

## 2017-01-06 MED ORDER — METHYLPREDNISOLONE ACETATE 80 MG/ML IJ SUSP
80.0000 mg | Freq: Once | INTRAMUSCULAR | Status: AC
  Administered 2017-01-06: 80 mg via INTRA_ARTICULAR
  Filled 2017-01-06: qty 1

## 2017-01-06 MED ORDER — ROPIVACAINE HCL 2 MG/ML IJ SOLN
9.0000 mL | Freq: Once | INTRAMUSCULAR | Status: AC
  Administered 2017-01-06: 9 mL via PERINEURAL
  Filled 2017-01-06: qty 10

## 2017-01-06 NOTE — Patient Instructions (Signed)
Post-Procedure instructions Instructions:  Apply ice: Fill a plastic sandwich bag with crushed ice. Cover it with a small towel and apply to injection site. Apply for 15 minutes then remove x 15 minutes. Repeat sequence on day of procedure, until you go to bed. The purpose is to minimize swelling and discomfort after procedure.  Apply heat: Apply heat to procedure site starting the day following the procedure. The purpose is to treat any soreness and discomfort from the procedure.  Food intake: Start with clear liquids (like water) and advance to regular food, as tolerated.   Physical activities: Keep activities to a minimum for the first 8 hours after the procedure.   Driving: If you have received any sedation, you are not allowed to drive for 24 hours after your procedure.  Blood thinner: Restart your blood thinner 6 hours after your procedure. (Only for those taking blood thinners)  Insulin: As soon as you can eat, you may resume your normal dosing schedule. (Only for those taking insulin)  Infection prevention: Keep procedure site clean and dry.  Post-procedure Pain Diary: Extremely important that this be done correctly and accurately. Recorded information will be used to determine the next step in treatment.  Pain evaluated is that of treated area only. Do not include pain from an untreated area.  Complete every hour, on the hour, for the initial 8 hours. Set an alarm to help you do this part accurately.  Do not go to sleep and have it completed later. It will not be accurate.  Follow-up appointment: Keep your follow-up appointment after the procedure. Usually 2 weeks for most procedures. (6 weeks in the case of radiofrequency.) Bring you pain diary.  Expect:  From numbing medicine (AKA: Local Anesthetics): Numbness or decrease in pain.  Onset: Full effect within 15 minutes of injected.  Duration: It will depend on the type of local anesthetic used. On the average, 1 to 8  hours.   From steroids: Decrease in swelling or inflammation. Once inflammation is improved, relief of the pain will follow.  Onset of benefits: Depends on the amount of swelling present. The more swelling, the longer it will take for the benefits to be seen.   Duration: Steroids will stay in the system x 2 weeks. Duration of benefits will depend on multiple posibilities including persistent irritating factors.  From procedure: Some discomfort is to be expected once the numbing medicine wears off. This should be minimal if ice and heat are applied as instructed. Call if:  You experience numbness and weakness that gets worse with time, as opposed to wearing off.  New onset bowel or bladder incontinence. (Spinal procedures only)  Emergency Numbers:  Durning business hours (Monday - Thursday, 8:00 AM - 4:00 PM) (Friday, 9:00 AM - 12:00 Noon): [phone]  After hours: [phone]   __________________________________________________________________________________________    

## 2017-01-06 NOTE — Progress Notes (Signed)
Patient's Name: Neriyah Cercone  MRN: [id number]  Referring Provider: Arnetha Courser, MD  DOB: 04/10/1971  PCP: Arnetha Courser, MD  DOS: 01/06/2017  Note by: Kathlen Brunswick. Dossie Arbour, MD  Service setting: Ambulatory outpatient  Location: [facility] (AMB) Pain Management Facility  Visit type: Procedure  Specialty: Interventional Pain Management  Patient type: Established   Primary Reason for Visit: Interventional Pain Management Treatment. CC: Back Pain (low, bilateral, mostly on left)  Procedure:  Anesthesia, Analgesia, Anxiolysis:  Procedure #1: Type: Diagnostic Medial Branch Facet Block Region: Lumbar Level: L2, L3, L4, L5, & S1 Medial Branch Level(s) Laterality: Bilateral  Procedure #2: Type: Diagnostic Sacroiliac Joint Block Region: Posterior Lumbosacral Level: PSIS (Posterior Superior Iliac Spine) Sacroiliac Joint Laterality: Left  Type: Local Anesthesia with Moderate (Conscious) Sedation Local Anesthetic: Lidocaine 1% Route: Intravenous (IV) IV Access: Secured Sedation: Meaningful verbal contact was maintained at all times during the procedure  Indication(s): Analgesia and Anxiety  Indications: 1. Lumbar facet joint syndrome (B) (L>R)   2. Chronic low back pain (Location of Primary Source of Pain) (Bilateral) (R>L) (midline)   3. Chronic sacroiliac joint pain (Left)   4. Lumbar spondylosis    Pain Score: Pre-procedure: 3 /10 Post-procedure: (P) 0-No pain/10  Pre-op Assessment:  Previous date of service: 12/24/16 Service provided: Med Refill, Evaluation Ms. Szczygiel is a 46 y.o. (year old), female patient, seen today for interventional treatment. She  has a past surgical history that includes Ablation; Tubal ligation (10/01/99); Cardiac catheterization; Knee arthroscopy; Colonoscopy with propofol (N/A, 05/17/2015); and Esophagogastroduodenoscopy (N/A, 05/17/2015). Her primarily concern today is the Back Pain (low, bilateral, mostly on left)  Initial Vital Signs: Blood pressure 124/73, pulse  76, temperature 98 F (36.7 C), resp. rate 18, height 5\' 3"  (1.6 m), weight 216 lb (98 kg), last menstrual period 09/07/2016, SpO2 99 %. BMI: 38.26 kg/m  Risk Assessment: Allergies: Reviewed. She is allergic to aspirin; cymbalta [duloxetine hcl]; demerol [meperidine]; depakote [divalproex sodium]; haloperidol; iodinated diagnostic agents; reglan [metoclopramide]; tramadol hcl; trazodone; compazine [prochlorperazine]; meloxicam; penicillins; tomato; bacitracin-neomycin-polymyxin; cephalosporins; ibuprofen; latex; neosporin [neomycin-bacitracin zn-polymyx]; nsaids; and sulfonamide derivatives.  Allergy Precautions: None required Coagulopathies: "Reviewed. None identified.  Blood-thinner therapy: None at this time Active Infection(s): Reviewed. None identified. Ms. Artiga is afebrile  Site Confirmation: Ms. Nordmeyer was asked to confirm the procedure and laterality before marking the site Procedure checklist: Completed Consent: Before the procedure and under the influence of no sedative(s), amnesic(s), or anxiolytics, the patient was informed of the treatment options, risks and possible complications. To fulfill our ethical and legal obligations, as recommended by the American Medical Association's Code of Ethics, I have informed the patient of my clinical impression; the nature and purpose of the treatment or procedure; the risks, benefits, and possible complications of the intervention; the alternatives, including doing nothing; the risk(s) and benefit(s) of the alternative treatment(s) or procedure(s); and the risk(s) and benefit(s) of doing nothing. The patient was provided information about the general risks and possible complications associated with the procedure. These may include, but are not limited to: failure to achieve desired goals, infection, bleeding, organ or nerve damage, allergic reactions, paralysis, and death. In addition, the patient was informed of those risks and complications  associated to Spine-related procedures, such as failure to decrease pain; infection (i.e.: Meningitis, epidural or intraspinal abscess); bleeding (i.e.: epidural hematoma, subarachnoid hemorrhage, or any other type of intraspinal or peri-dural bleeding); organ or nerve damage (i.e.: Any type of peripheral nerve, nerve root, or spinal cord injury) with subsequent  damage to sensory, motor, and/or autonomic systems, resulting in permanent pain, numbness, and/or weakness of one or several areas of the body; allergic reactions; (i.e.: anaphylactic reaction); and/or death. Furthermore, the patient was informed of those risks and complications associated with the medications. These include, but are not limited to: allergic reactions (i.e.: anaphylactic or anaphylactoid reaction(s)); adrenal axis suppression; blood sugar elevation that in diabetics may result in ketoacidosis or comma; water retention that in patients with history of congestive heart failure may result in shortness of breath, pulmonary edema, and decompensation with resultant heart failure; weight gain; swelling or edema; medication-induced neural toxicity; particulate matter embolism and blood vessel occlusion with resultant organ, and/or nervous system infarction; and/or aseptic necrosis of one or more joints. Finally, the patient was informed that Medicine is not an exact science; therefore, there is also the possibility of unforeseen or unpredictable risks and/or possible complications that may result in a catastrophic outcome. The patient indicated having understood very clearly. We have given the patient no guarantees and we have made no promises. Enough time was given to the patient to ask questions, all of which were answered to the patient's satisfaction. Ms. Fasching has indicated that she wanted to continue with the procedure. Attestation: I, the ordering provider, attest that I have discussed with the patient the benefits, risks, side-effects,  alternatives, likelihood of achieving goals, and potential problems during recovery for the procedure that I have provided informed consent. Date: 01/06/2017; Time: 9:12 AM  Pre-Procedure Preparation:  Monitoring: As per clinic protocol. Respiration, ETCO2, SpO2, BP, heart rate and rhythm monitor placed and checked for adequate function Safety Precautions: Patient was assessed for positional comfort and pressure points before starting the procedure. Time-out: I initiated and conducted the "Time-out" before starting the procedure, as per protocol. The patient was asked to participate by confirming the accuracy of the "Time Out" information. Verification of the correct person, site, and procedure were performed and confirmed by me, the nursing staff, and the patient. "Time-out" conducted as per Joint Commission's Universal Protocol (UP.01.01.01). "Time-out" Date & Time: 01/06/2017; 0950 hrs.  Description of Procedure #1 Process:   Time-out: "Time-out" completed before starting procedure, as per protocol. Position: Prone Target Area: For Lumbar Facet blocks, the target is the groove formed by the junction of the transverse process and superior articular process. For the L5 dorsal ramus, the target is the notch between superior articular process and sacral ala. For the S1 dorsal ramus, the target is the superior and lateral edge of the posterior S1 Sacral foramen. Approach: Paramedial approach. Area Prepped: Entire Posterior Lumbosacral Region Prepping solution: ChloraPrep (2% chlorhexidine gluconate and 70% isopropyl alcohol) Safety Precautions: Aspiration looking for blood return was conducted prior to all injections. At no point did we inject any substances, as a needle was being advanced. No attempts were made at seeking any paresthesias. Safe injection practices and needle disposal techniques used. Medications properly checked for expiration dates. SDV (single dose vial) medications used.  Description  of the Procedure: Protocol guidelines were followed. The patient was placed in position over the fluoroscopy table. The target area was identified and the area prepped in the usual manner. Skin desensitized using vapocoolant spray. Skin & deeper tissues infiltrated with local anesthetic. Appropriate amount of time allowed to pass for local anesthetics to take effect. The procedure needle was introduced through the skin, ipsilateral to the reported pain, and advanced to the target area. Employing the "Medial Branch Technique", the needles were advanced to the angle made  by the superior and medial portion of the transverse process, and the lateral and inferior portion of the superior articulating process of the targeted vertebral bodies. This area is known as "Burton's Eye" or the "Eye of the [place] Dog". A procedure needle was introduced through the skin, and this time advanced to the angle made by the superior and medial border of the sacral ala, and the lateral border of the S1 vertebral body. This last needle was later repositioned at the superior and lateral border of the posterior S1 foramen. Negative aspiration confirmed. Solution injected in intermittent fashion, asking for systemic symptoms every 0.5cc of injectate. The needles were then removed and the area cleansed, making sure to leave some of the prepping solution back to take advantage of its long term bactericidal properties. Start Time: 0950 hrs. Materials:  Needle(s) Type: Regular needle Gauge: 22G Length: 3.5-in Medication(s): We administered fentaNYL, lactated ringers, midazolam, triamcinolone acetonide, triamcinolone acetonide, ropivacaine (PF) 2 mg/mL (0.2%), ropivacaine (PF) 2 mg/mL (0.2%), methylPREDNISolone acetate, and ropivacaine (PF) 5 mg/mL (0.5%). Please see chart orders for dosing details.  Description of Procedure # 2 Process:   Position: Prone Target Area: For upper sacroiliac joint block(s), the target is the superior and  posterior margin of the sacroiliac joint. Approach: Ipsilateral approach. Area Prepped: Entire Posterior Lumbosacral Region Prepping solution: ChloraPrep (2% chlorhexidine gluconate and 70% isopropyl alcohol) Safety Precautions: Aspiration looking for blood return was conducted prior to all injections. At no point did we inject any substances, as a needle was being advanced. No attempts were made at seeking any paresthesias. Safe injection practices and needle disposal techniques used. Medications properly checked for expiration dates. SDV (single dose vial) medications used. Description of the Procedure: Protocol guidelines were followed. The patient was placed in position over the fluoroscopy table. The target area was identified and the area prepped in the usual manner. Skin desensitized using vapocoolant spray. Skin & deeper tissues infiltrated with local anesthetic. Appropriate amount of time allowed to pass for local anesthetics to take effect. The procedure needle was advanced under fluoroscopic guidance into the sacroiliac joint until a firm endpoint was obtained. Proper needle placement secured. Negative aspiration confirmed. Solution injected in intermittent fashion, asking for systemic symptoms every 0.5cc of injectate. The needles were then removed and the area cleansed, making sure to leave some of the prepping solution back to take advantage of its long term bactericidal properties. Vitals:   01/06/17 1009 01/06/17 1016 01/06/17 1026 01/06/17 1036  BP: 114/72 119/61 124/62 (!) (P) 116/56  Pulse: 75 83 79 (P) 77  Resp: 10 15 19  (P) 13  Temp:  99 F (37.2 C)    TempSrc:  Temporal    SpO2: 97% 97% 98% (P) 98%  Weight:      Height:        End Time: 1007 hrs. Materials:  Needle(s) Type: Regular needle Gauge: 22G Length: 3.5-in Medication(s): We administered fentaNYL, lactated ringers, midazolam, triamcinolone acetonide, triamcinolone acetonide, ropivacaine (PF) 2 mg/mL (0.2%),  ropivacaine (PF) 2 mg/mL (0.2%), methylPREDNISolone acetate, and ropivacaine (PF) 5 mg/mL (0.5%). Please see chart orders for dosing details.  Imaging Guidance (Spinal):  Type of Imaging Technique: Fluoroscopy Guidance (Spinal) Indication(s): Assistance in needle guidance and placement for procedures requiring needle placement in or near specific anatomical locations not easily accessible without such assistance. Exposure Time: Please see nurses notes. Contrast: None used. Fluoroscopic Guidance: I was personally present during the use of fluoroscopy. "Tunnel Vision Technique" used to obtain the best possible  view of the target area. Parallax error corrected before commencing the procedure. "Direction-depth-direction" technique used to introduce the needle under continuous pulsed fluoroscopy. Once target was reached, antero-posterior, oblique, and lateral fluoroscopic projection used confirm needle placement in all planes. Images permanently stored in EMR. Interpretation: No contrast injected. I personally interpreted the imaging intraoperatively. Adequate needle placement confirmed in multiple planes. Permanent images saved into the patient's record.  Antibiotic Prophylaxis:  Indication(s): None identified Antibiotic given: None  Post-operative Assessment:  EBL: None Complications: No immediate post-treatment complications observed by team, or reported by patient. Note: The patient tolerated the entire procedure well. A repeat set of vitals were taken after the procedure and the patient was kept under observation following institutional policy, for this type of procedure. Post-procedural neurological assessment was performed, showing return to baseline, prior to discharge. The patient was provided with post-procedure discharge instructions, including a section on how to identify potential problems. Should any problems arise concerning this procedure, the patient was given instructions to immediately  contact us, at any time, without hesitation. In any case, we plan to contact the patient by telephone for a follow-up status report regarding this interventional procedure. Comments:  No additional relevant information.  Plan of Care  Disposition: Discharge home  Discharge Date & Time: 01/06/2017; 1110 hrs.  Physician-requested Follow-up:  Return in about 2 weeks (around 01/20/2017) for Post-Procedure evaluation.  Future Appointments Date Time Provider [place]  01/23/2017 2:00 PM Milinda Pointer, MD [facility] None  03/27/2017 10:00 AM Milinda Pointer, MD [facility] None   Medications ordered for procedure: Meds ordered this encounter  Medications  . fentaNYL (SUBLIMAZE) injection 25-50 mcg    Make sure Narcan is available in the pyxis when using this medication. In the event of respiratory depression (RR< 8/min): Titrate NARCAN (naloxone) in increments of 0.1 to 0.2 mg IV at 2-3 minute intervals, until desired degree of reversal.  . lactated ringers infusion 1,000 mL  . midazolam (VERSED) 5 MG/5ML injection 1-2 mg    Make sure Flumazenil is available in the pyxis when using this medication. If oversedation occurs, administer 0.2 mg IV over 15 sec. If after 45 sec no response, administer 0.2 mg again over 1 min; may repeat at 1 min intervals; not to exceed 4 doses (1 mg)  . triamcinolone acetonide (KENALOG-40) injection 40 mg  . triamcinolone acetonide (KENALOG-40) injection 40 mg  . lidocaine (PF) (XYLOCAINE) 1 % injection 10 mL  . ropivacaine (PF) 2 mg/mL (0.2%) (NAROPIN) injection 9 mL  . ropivacaine (PF) 2 mg/mL (0.2%) (NAROPIN) injection 9 mL  . methylPREDNISolone acetate (DEPO-MEDROL) injection 80 mg  . ropivacaine (PF) 5 mg/mL (0.5%) (NAROPIN) injection 4 mL  . lidocaine (PF) (XYLOCAINE) 1 % injection 10 mL   Medications administered: We administered fentaNYL, lactated ringers, midazolam, triamcinolone acetonide, triamcinolone acetonide, ropivacaine (PF) 2 mg/mL  (0.2%), ropivacaine (PF) 2 mg/mL (0.2%), methylPREDNISolone acetate, and ropivacaine (PF) 5 mg/mL (0.5%).  See the medical record for exact dosing, route, and time of administration.  Lab-work, Procedure(s), & Referral(s) Ordered: Orders Placed This Encounter  Procedures  . LUMBAR FACET(MEDIAL BRANCH NERVE BLOCK) MBNB  . SACROILIAC JOINT INJECTINS  . DG C-Arm 1-60 Min-No Report  . Discharge instructions  . Follow-up  . Informed Consent Details: Transcribe to consent form and obtain patient signature  . Provider attestation of informed consent for procedure/surgical case  . Verify informed consent   Imaging Ordered: Results for orders placed in visit on 10/30/16  DG C-Arm 1-60 Min-No Report  Narrative Fluoroscopy was utilized by the requesting physician.  No radiographic  interpretation.    New Prescriptions   No medications on file   Primary Care Physician: Arnetha Courser, MD Location: [facility] Note by: Kathlen Brunswick. Dossie Arbour, M.D, DABA, DABAPM, DABPM, DABIPP, FIPP Date: 01/06/2017; Time: 3:24 PM  Disclaimer:  Medicine is not an Chief Strategy Officer. The only guarantee in medicine is that nothing is guaranteed. It is important to note that the decision to proceed with this intervention was based on the information collected from the patient. The Data and conclusions were drawn from the patient's questionnaire, the interview, and the physical examination. Because the information was provided in large part by the patient, it cannot be guaranteed that it has not been purposely or unconsciously manipulated. Every effort has been made to obtain as much relevant data as possible for this evaluation. It is important to note that the conclusions that lead to this procedure are derived in large part from the available data. Always take into account that the treatment will also be dependent on availability of resources and existing treatment guidelines, considered by other  Pain Management Practitioners as being common knowledge and practice, at the time of the intervention. For Medico-Legal purposes, it is also important to point out that variation in procedural techniques and pharmacological choices are the acceptable norm. The indications, contraindications, technique, and results of the above procedure should only be interpreted and judged by a Board-Certified Interventional Pain Specialist with extensive familiarity and expertise in the same exact procedure and technique. Attempts at providing opinions without similar or greater experience and expertise than that of the treating physician will be considered as inappropriate and unethical, and shall result in a formal complaint to the state medical board and applicable specialty societies.  Instructions provided at this appointment: Patient Instructions  Post-Procedure instructions Instructions:  Apply ice: Fill a plastic sandwich bag with crushed ice. Cover it with a small towel and apply to injection site. Apply for 15 minutes then remove x 15 minutes. Repeat sequence on day of procedure, until you go to bed. The purpose is to minimize swelling and discomfort after procedure.  Apply heat: Apply heat to procedure site starting the day following the procedure. The purpose is to treat any soreness and discomfort from the procedure.  Food intake: Start with clear liquids (like water) and advance to regular food, as tolerated.   Physical activities: Keep activities to a minimum for the first 8 hours after the procedure.   Driving: If you have received any sedation, you are not allowed to drive for 24 hours after your procedure.  Blood thinner: Restart your blood thinner 6 hours after your procedure. (Only for those taking blood thinners)  Insulin: As soon as you can eat, you may resume your normal dosing schedule. (Only for those taking insulin)  Infection prevention: Keep procedure site clean and  dry.  Post-procedure Pain Diary: Extremely important that this be done correctly and accurately. Recorded information will be used to determine the next step in treatment.  Pain evaluated is that of treated area only. Do not include pain from an untreated area.  Complete every hour, on the hour, for the initial 8 hours. Set an alarm to help you do this part accurately.  Do not go to sleep and have it completed later. It will not be accurate.  Follow-up appointment: Keep your follow-up appointment after the procedure. Usually 2 weeks for most procedures. (6 weeks in the  case of radiofrequency.) Bring you pain diary.  Expect:  From numbing medicine (AKA: Local Anesthetics): Numbness or decrease in pain.  Onset: Full effect within 15 minutes of injected.  Duration: It will depend on the type of local anesthetic used. On the average, 1 to 8 hours.   From steroids: Decrease in swelling or inflammation. Once inflammation is improved, relief of the pain will follow.  Onset of benefits: Depends on the amount of swelling present. The more swelling, the longer it will take for the benefits to be seen.   Duration: Steroids will stay in the system x 2 weeks. Duration of benefits will depend on multiple posibilities including persistent irritating factors.  From procedure: Some discomfort is to be expected once the numbing medicine wears off. This should be minimal if ice and heat are applied as instructed. Call if:  You experience numbness and weakness that gets worse with time, as opposed to wearing off.  New onset bowel or bladder incontinence. (Spinal procedures only)  Emergency Numbers:  [place] business hours (Monday - Thursday, 8:00 AM - 4:00 PM) (Friday, 9:00 AM - 12:00 Noon): [phone]  After hours: [phone]   __________________________________________________________________________________________

## 2017-01-07 ENCOUNTER — Encounter: Payer: Self-pay | Admitting: Pain Medicine

## 2017-01-07 ENCOUNTER — Telehealth: Payer: Self-pay | Admitting: *Deleted

## 2017-01-07 ENCOUNTER — Ambulatory Visit: Payer: BLUE CROSS/BLUE SHIELD

## 2017-01-07 NOTE — Telephone Encounter (Signed)
S/O states patient is better today.

## 2017-01-14 NOTE — Progress Notes (Signed)
Results were reviewed and found to be: abnormal (11 mm kidney stone on the right side)  Surgical consultation may be of benefit. The patient may benefit from a urology consult for possible removal of the stone.  Review would suggest no procedures needed at this time

## 2017-01-23 ENCOUNTER — Ambulatory Visit: Payer: BLUE CROSS/BLUE SHIELD | Admitting: Pain Medicine

## 2017-02-10 ENCOUNTER — Ambulatory Visit: Payer: BLUE CROSS/BLUE SHIELD | Admitting: Pain Medicine

## 2017-02-11 NOTE — Progress Notes (Signed)
Results were reviewed and found to be: mildly abnormal  No acute injury or pathology identified  Review would suggest interventional pain management techniques may be of benefit 

## 2017-03-27 ENCOUNTER — Ambulatory Visit: Payer: BLUE CROSS/BLUE SHIELD | Admitting: Pain Medicine

## 2017-03-31 NOTE — Progress Notes (Signed)
Patient's Name: Dana Bradley  MRN: [id number]  Referring Provider: Arnetha Courser, MD  DOB: 1971/07/29  PCP: Arnetha Courser, MD  DOS: 04/01/2017  Note by: Kathlen Brunswick. Dossie Arbour, MD  Service setting: Ambulatory outpatient  Specialty: Interventional Pain Management  Location: [facility] (AMB) Pain Management Facility    Patient type: Established   Primary Reason(s) for Visit: Encounter for prescription drug management & post-procedure evaluation of chronic illness with mild to moderate exacerbation(Level of risk: moderate) CC: Back Pain (lower); Shoulder Pain (bilateral); and Neck Pain (radiates up back of head to forehead)  HPI  Dana Bradley is a 46 y.o. year old, female patient, who comes today for a post-procedure evaluation and medication management. She has Depression, major, recurrent, in remission ([place]); Hypertension, benign essential, goal below 140/90; History of PSVT (paroxysmal supraventricular tachycardia); GERD; Obstructive sleep apnea, adult; Menorrhagia; Cervico-occipital neuralgia; DDD (degenerative disc disease), lumbosacral; Paroxysmal supraventricular tachycardia ([place]); Chronic pain of multiple joints; Chronic superficial gastritis; Degeneration of intervertebral disc of lumbosacral region; Fibromyalgia; Insomnia; Migraine without aura and with status migrainosus, not intractable; Chronic lower extremity cramps (Bilateral); Obesity; GAD (generalized anxiety disorder); Fatigue; Atypical lymphocytosis; Vitamin D insufficiency; Panic disorder with agoraphobia; Depression, unspecified depression type; Chronic pain syndrome; Long term prescription opiate use; Opiate use; Long term prescription benzodiazepine use; Neurogenic pain; Chronic low back pain (Location of Primary Source of Pain) (Bilateral) (R>L) (midline); Chronic upper back pain (Location of Secondary source of pain) (Bilateral) (L>R); Chronic abdominal pain (Right lower quadrant); Thoracic radiculitis (Right) (T11 dermatome); Chronic occipital  neuralgia (Location of Tertiary source of pain) (Bilateral) (L>R); Chronic neck pain; Chronic cervical radicular pain (Bilateral) (L>R); Chronic shoulder blade pain (Bilateral) (L>R); Chronic upper extremity pain (Bilateral) (R>L); Chronic knee pain (Bilateral) (R>L); Chronic ankle pain (Bilateral); Cervical spondylosis with myelopathy and radiculopathy; Nephrolithiasis; Controlled substance agreement signed; Plantar fasciitis of left foot; Vitamin B12 deficiency; Hyperlipidemia; Medication monitoring encounter; Intractable headache; Bilateral leg edema; Bright red rectal bleeding; Hypertriglyceridemia; Chronic left hip pain; Chronic sacroiliac joint pain (Left); Lumbar facet joint syndrome (B) (L>R); Left lumbar radiculitis; Lumbar spondylosis; Migraine headache; Muscle spasticity; and Osteoarthritis of shoulder (B) on her problem list. Her primarily concern today is the Back Pain (lower); Shoulder Pain (bilateral); and Neck Pain (radiates up back of head to forehead)  Pain Assessment: Location: Lower Back Radiating: radiates to hips/tailbone bilaterally: radiates  down outside of left leg to thigh, radiates down side of right leg and then rotates inward to shin Onset: More than a month ago Duration: Chronic pain Quality: Aching, Burning, Grimacing (electric impulses) Severity: 3 /10 (self-reported pain score)  Note: Reported level is compatible with observation.                   Effect on ADL: cant do what she would like to do- hard to take shower cause she can stand---- headches daily  Timing: Constant Modifying factors: firm chair- pounding on thighs- moving around  Dana Bradley was last seen on 01/06/2017 for a procedure. During today's appointment we reviewed Dana Bradley's post-procedure results, as well as her outpatient medication regimen. The patient experienced incomplete relief of the low back pain suggesting the possibility that there may be something else going on. An MRI would suggest problems  with the upper lumbar region. I will schedule her to return for a lumbar epidural steroid injection at the L1-2 level to see if this is the area responsible for her symptoms.  Further details on both, my assessment(s), as well as the  proposed treatment plan, please see below.  Controlled Substance Pharmacotherapy Assessment REMS (Risk Evaluation and Mitigation Strategy)  Analgesic:Hydrocodone/APAP 7.5/325 one daily MME/day:7.'5mg'$ /day.  Dana Specking, RN  04/01/2017  2:01 PM  Sign at close encounter Nursing Pain Medication Assessment:  Safety precautions to be maintained throughout the outpatient stay will include: orient to surroundings, keep bed in low position, maintain call bell within reach at all times, provide assistance with transfer out of bed and ambulation.  Medication Inspection Compliance: Dana Bradley did not comply with our request to bring her pills to be counted. She was reminded that bringing the medication bottles, even when empty, is a requirement.  Medication: None brought in. Pill/Patch Count: None available to be counted. Bottle Appearance: No container available. Did not bring bottle(s) to appointment. Filled Date: N/A Last Medication intake:  Yesterday   Pharmacokinetics: Liberation and absorption (onset of action): WNL Distribution (time to peak effect): WNL Metabolism and excretion (duration of action): WNL         Pharmacodynamics: Desired effects: Analgesia: Dana Bradley reports >50% benefit. Functional ability: Patient reports that medication allows her to accomplish basic ADLs Clinically meaningful improvement in function (CMIF): Sustained CMIF goals met Perceived effectiveness: Described as relatively effective, allowing for increase in activities of daily living (ADL) Undesirable effects: Side-effects or Adverse reactions: None reported Monitoring: [place] PMP: Online review of the past 89-monthperiod conducted. Compliant with practice rules and  regulations List of all UDS test(s) done:  Lab Results  Component Value Date   SUMMARY FINAL 08/07/2016   Last UDS on record: Summary  Date Value Ref Range Status  08/07/2016 FINAL  Final    Comment:    ==================================================================== TOXASSURE COMP DRUG ANALYSIS,UR ==================================================================== Test                             Result       Flag       Units Drug Present and Declared for Prescription Verification   Pregabalin                     PRESENT      EXPECTED   Lamotrigine                    PRESENT      EXPECTED   Amitriptyline                  PRESENT      EXPECTED Drug Absent but Declared for Prescription Verification   Alprazolam                     Not Detected UNEXPECTED ng/mg creat   Codeine                        Not Detected UNEXPECTED ng/mg creat   Tizanidine                     Not Detected UNEXPECTED    Tizanidine, as indicated in the declared medication list, is not    always detected even when used as directed.   Nortriptyline                  Not Detected UNEXPECTED   Acetaminophen                  Not Detected UNEXPECTED    Acetaminophen, as indicated in  the declared medication list, is    not always detected even when used as directed.   Metoprolol                     Not Detected UNEXPECTED ==================================================================== Test                      Result    Flag   Units      Ref Range   Creatinine              86               mg/dL      >=20 ==================================================================== Declared Medications:  The flagging and interpretation on this report are based on the  following declared medications.  Unexpected results may arise from  inaccuracies in the declared medications.  **Note: The testing scope of this panel includes these medications:  Alprazolam  Amitriptyline  Codeine (T4)  Lamotrigine  Metoprolol   Pregabalin  **Note: The testing scope of this panel does not include small to  moderate amounts of these reported medications:  Acetaminophen (T4)  Tizanidine  **Note: The testing scope of this panel does not include following  reported medications:  Calcium Carbonate (Calcium Carb/Vitamin D)  Magnesium (Mag)  Sumatriptan  Vitamin D (Calcium Carb/Vitamin D) ==================================================================== For clinical consultation, please call [phone]. ====================================================================    UDS interpretation: Compliant          Medication Assessment Form: Reviewed. Patient indicates being compliant with therapy Treatment compliance: Compliant Risk Assessment Profile: Aberrant behavior: See prior evaluations. None observed or detected today Comorbid factors increasing risk of overdose: See prior notes. No additional risks detected today Risk of substance use disorder (SUD): Low Opioid Risk Tool (ORT) Total Score:    Interpretation Table:  Score <3 = Low Risk for SUD  Score between 4-7 = Moderate Risk for SUD  Score >8 = High Risk for Opioid Abuse   Risk Mitigation Strategies:  Patient Counseling: Covered Patient-Prescriber Agreement (PPA): Present and active  Notification to other healthcare providers: Done  Pharmacologic Plan: No change in therapy, at this time  Post-Procedure Assessment  01/06/2017 Procedure: Diagnostic bilateral lumbar facet + left sacroiliac joint block under fluoroscopic guidance and IV sedation Pre-procedure pain score:  3/10 Post-procedure pain score: 0/10 (100% relief) Influential Factors: BMI: 38.26 kg/m Intra-procedural challenges: Vasovagal reflex Assessment challenges: None detected         Post-procedural adverse reactions or complications: None reported Reported side-effects: None  Sedation: Sedation provided. When no sedatives are used, the analgesic levels obtained are directly  associated to the effectiveness of the local anesthetics. However, when sedation is provided, the level of analgesia obtained during the initial 1 hour following the intervention, is believed to be the result of a combination of factors. These factors may include, but are not limited to: 1. The effectiveness of the local anesthetics used. 2. The effects of the analgesic(s) and/or anxiolytic(s) used. 3. The degree of discomfort experienced by the patient at the time of the procedure. 4. The patients ability and reliability in recalling and recording the events. 5. The presence and influence of possible secondary gains and/or psychosocial factors. Reported result: Relief experienced during the 1st hour after the procedure: 90 % (Ultra-Short Term Relief) Interpretative annotation: Analgesia during this period is likely to be Local Anesthetic and/or IV Sedative (Analgesic/Anxiolitic) related.          Effects of local anesthetic: The analgesic  effects attained during this period are directly associated to the localized infiltration of local anesthetics and therefore cary significant diagnostic value as to the etiological location, or anatomical origin, of the pain. Expected duration of relief is directly dependent on the pharmacodynamics of the local anesthetic used. Long-acting (4-6 hours) anesthetics used.  Reported result: Relief during the next 4 to 6 hour after the procedure: 90 % (Short-Term Relief) Interpretative annotation: Complete relief would suggest area to be the source of the pain.          Long-term benefit: Defined as the period of time past the expected duration of local anesthetics. With the possible exception of prolonged sympathetic blockade from the local anesthetics, benefits during this period are typically attributed to, or associated with, other factors such as analgesic sensory neuropraxia, antiinflammatory effects, or beneficial biochemical changes provided by agents other than  the local anesthetics Reported result: Extended relief following procedure: 0 % (lasted about 2 months and had gradually coming day-) (Long-Term Relief) Interpretative annotation: No long-term benefit. This could suggest limited inflammatory component to the pain with possible mechanical aggravating factors.          Current benefits: Defined as persistent relief that continues at this point in time.   Reported results: Treated area: 0 %       Interpretative annotation: Recurrance of symptoms. This would suggest persistent aggravating factors  Interpretation: Results would suggest a successful diagnostic intervention.          Laboratory Chemistry  Inflammation Markers (CRP: Acute Phase) (ESR: Chronic Phase) Lab Results  Component Value Date   CRP 1.4 (H) 08/08/2016   ESRSEDRATE 14 08/08/2016                 Renal Function Markers Lab Results  Component Value Date   BUN 10 08/08/2016   CREATININE 0.77 08/08/2016   GFRAA >60 08/08/2016   GFRNONAA >60 08/08/2016                 Hepatic Function Markers Lab Results  Component Value Date   AST 25 08/08/2016   ALT 23 11/27/2016   ALBUMIN 4.1 08/08/2016   ALKPHOS 82 08/08/2016                 Electrolytes Lab Results  Component Value Date   NA 141 08/08/2016   K 4.1 08/08/2016   CL 107 08/08/2016   CALCIUM 9.4 08/08/2016   MG 2.2 08/08/2016                 Neuropathy Markers Lab Results  Component Value Date   VITAMINB12 183 08/08/2016                 Bone Pathology Markers Lab Results  Component Value Date   ALKPHOS 82 08/08/2016   VD25OH 17 (L) 04/29/2016   25OHVITD1 25 (L) 08/08/2016   25OHVITD2 15 08/08/2016   25OHVITD3 10 08/08/2016   CALCIUM 9.4 08/08/2016                 Coagulation Parameters Lab Results  Component Value Date   INR 1.01 04/23/2010   LABPROT 13.2 04/23/2010   PLT 267 04/29/2016                 Cardiovascular Markers Lab Results  Component Value Date   HGB 13.8 04/29/2016    HCT 40.0 04/29/2016                 Note: Lab  results reviewed.  Recent Diagnostic Imaging Review  Dg C-arm 1-60 Min-no Report  Result Date: 01/06/2017 Fluoroscopy was utilized by the requesting physician.  No radiographic interpretation.   Note: Imaging results reviewed.          Meds   Current Meds  Medication Sig  . ALPRAZolam (XANAX) 1 MG tablet Take 1 tablet (1 mg total) by mouth at bedtime as needed for anxiety.  Marland Kitchen amitriptyline (ELAVIL) 10 MG tablet Take 10 mg by mouth as needed.  Marland Kitchen atorvastatin (LIPITOR) 10 MG tablet Take 1 tablet (10 mg total) by mouth at bedtime. Every other day  . Calcium-Vitamin D-Vitamin K 127-517-00 MG-UNT-MCG TABS Take by mouth.  . Cyanocobalamin (VITAMIN B-12 PO) Take 1,500 mcg by mouth daily.  Marland Kitchen lamoTRIgine (LAMICTAL) 100 MG tablet Take 1 tablet (100 mg total) by mouth daily. (note new instructions and strength) (Patient taking differently: Take 100 mg by mouth 2 (two) times daily. (note new instructions and strength))  . magnesium 30 MG tablet Take 30 mg by mouth daily.   . metoprolol (LOPRESSOR) 50 MG tablet Take 1 tablet (50 mg total) by mouth 2 (two) times daily.  Marland Kitchen omega-3 acid ethyl esters (LOVAZA) 1 g capsule Take 2 capsules (2 g total) by mouth 2 (two) times daily.    ROS  Constitutional: Denies any fever or chills Gastrointestinal: No reported hemesis, hematochezia, vomiting, or acute GI distress Musculoskeletal: Denies any acute onset joint swelling, redness, loss of ROM, or weakness Neurological: No reported episodes of acute onset apraxia, aphasia, dysarthria, agnosia, amnesia, paralysis, loss of coordination, or loss of consciousness  Allergies  Ms. Honsinger is allergic to aspirin; cymbalta [duloxetine hcl]; demerol [meperidine]; depakote [divalproex sodium]; haloperidol; iodinated diagnostic agents; reglan [metoclopramide]; tramadol hcl; trazodone; compazine [prochlorperazine]; meloxicam; penicillins; tomato; bacitracin-neomycin-polymyxin;  cephalosporins; ibuprofen; latex; neosporin [neomycin-bacitracin zn-polymyx]; nsaids; and sulfonamide derivatives.  PFSH  Drug: Ms. Ade  has no drug history on file. Alcohol:  reports that she does not drink alcohol. Tobacco:  reports that she quit smoking about 24 years ago. Her smoking use included Cigarettes. She has a 12.00 pack-year smoking history. She has never used smokeless tobacco. Medical:  has a past medical history of Bursitis; Edema leg (05/02/2015); Fibromyalgia; GERD (gastroesophageal reflux disease); IBS (irritable bowel syndrome); Knee pain, bilateral (12/21/2008); Lumbar discitis; Osteoarthritis; Right hand pain (04/10/2015); Sleep apnea; SVT (supraventricular tachycardia) ([place]); Vertigo; and Vitamin D deficiency (05/01/2016). Surgical: Ms. Lindenberger  has a past surgical history that includes Ablation; Tubal ligation (10/01/99); Cardiac catheterization; Knee arthroscopy; Colonoscopy with propofol (N/A, 05/17/2015); Esophagogastroduodenoscopy (N/A, 05/17/2015); and spg. Family: family history includes Alcohol abuse in her father; Alzheimer's disease in her paternal grandmother; Aneurysm in her maternal grandfather; Arthritis in her maternal grandmother; Bipolar disorder in her sister; COPD in her paternal grandfather; Cancer in her mother; Cancer (age of onset: 42) in her maternal grandmother; Depression in her father, mother, sister, and sister; Diabetes in her sister; Heart attack in her paternal grandfather; Heart disease in her father, maternal grandfather, and paternal grandfather; Hyperlipidemia in her maternal grandmother, mother, and sister; Hypertension in her father, maternal grandfather, mother, paternal grandfather, sister, and sister; Polycystic ovary syndrome in her sister; Stroke in her father; Thyroid disease in her maternal grandmother.  Constitutional Exam  General appearance: Well nourished, well developed, and well hydrated. In no apparent acute distress Vitals:   04/01/17  1341  BP: 118/75  Pulse: 90  Resp: 16  Temp: 98.5 F (36.9 C)  SpO2: 98%  Weight: 216 lb (  98 kg)  Height: '5\' 3"'$  (1.6 m)   BMI Assessment: Estimated body mass index is 38.26 kg/m as calculated from the following:   Height as of this encounter: '5\' 3"'$  (1.6 m).   Weight as of this encounter: 216 lb (98 kg).  BMI interpretation table: BMI level Category Range association with higher incidence of chronic pain  <18 kg/m2 Underweight   18.5-24.9 kg/m2 Ideal body weight   25-29.9 kg/m2 Overweight Increased incidence by 20%  30-34.9 kg/m2 Obese (Class I) Increased incidence by 68%  35-39.9 kg/m2 Severe obesity (Class II) Increased incidence by 136%  >40 kg/m2 Extreme obesity (Class III) Increased incidence by 254%   BMI Readings from Last 4 Encounters:  04/01/17 38.26 kg/m  01/06/17 38.26 kg/m  12/24/16 38.26 kg/m  11/27/16 37.27 kg/m   Wt Readings from Last 4 Encounters:  04/01/17 216 lb (98 kg)  01/06/17 216 lb (98 kg)  12/24/16 216 lb (98 kg)  11/27/16 210 lb 6.4 oz (95.4 kg)  Psych/Mental status: Alert, oriented x 3 (person, place, & time)       Eyes: PERLA Respiratory: No evidence of acute respiratory distress  Cervical Spine Exam  Inspection: No masses, redness, or swelling Alignment: Symmetrical Functional ROM: Unrestricted ROM      Stability: No instability detected Muscle strength & Tone: Functionally intact Sensory: Unimpaired Palpation: No palpable anomalies              Upper Extremity (UE) Exam    Side: Right upper extremity  Side: Left upper extremity  Inspection: No masses, redness, swelling, or asymmetry. No contractures  Inspection: No masses, redness, swelling, or asymmetry. No contractures  Functional ROM: Unrestricted ROM          Functional ROM: Unrestricted ROM          Muscle strength & Tone: Functionally intact  Muscle strength & Tone: Functionally intact  Sensory: Unimpaired  Sensory: Unimpaired  Palpation: No palpable anomalies               Palpation: No palpable anomalies              Specialized Test(s): Deferred         Specialized Test(s): Deferred          Thoracic Spine Exam  Inspection: No masses, redness, or swelling Alignment: Symmetrical Functional ROM: Unrestricted ROM Stability: No instability detected Sensory: Unimpaired Muscle strength & Tone: No palpable anomalies  Lumbar Spine Exam  Inspection: No masses, redness, or swelling Alignment: Symmetrical Functional ROM: Unrestricted ROM      Stability: No instability detected Muscle strength & Tone: Functionally intact Sensory: Unimpaired Palpation: No palpable anomalies       Provocative Tests: Lumbar Hyperextension and rotation test: evaluation deferred today       Patrick's Maneuver: evaluation deferred today                    Gait & Posture Assessment  Ambulation: Unassisted Gait: Relatively normal for age and body habitus Posture: WNL   Lower Extremity Exam    Side: Right lower extremity  Side: Left lower extremity  Inspection: No masses, redness, swelling, or asymmetry. No contractures  Inspection: No masses, redness, swelling, or asymmetry. No contractures  Functional ROM: Unrestricted ROM          Functional ROM: Unrestricted ROM          Muscle strength & Tone: Functionally intact  Muscle strength & Tone: Functionally intact  Sensory: Unimpaired  Sensory: Unimpaired  Palpation: No palpable anomalies  Palpation: No palpable anomalies   Assessment  Primary Diagnosis & Pertinent Problem List: The primary encounter diagnosis was Cervico-occipital neuralgia. Diagnoses of Chronic neck pain, Chronic low back pain (Location of Primary Source of Pain) (Bilateral) (R>L) (midline), Chronic upper back pain (Location of Secondary source of pain) (Bilateral) (L>R), Chronic occipital neuralgia (Location of Tertiary source of pain) (Bilateral) (L>R), Chronic pain syndrome, Fibromyalgia, Long term prescription opiate use, Opiate use, Lumbar facet joint syndrome  (B) (L>R), Lumbar spondylosis, Chronic sacroiliac joint pain (Left), Neurogenic pain, Muscle spasticity, and Osteoarthritis of shoulder (B) were also pertinent to this visit.  Status Diagnosis  Controlled Controlled Controlled 1. Cervico-occipital neuralgia   2. Chronic neck pain   3. Chronic low back pain (Location of Primary Source of Pain) (Bilateral) (R>L) (midline)   4. Chronic upper back pain (Location of Secondary source of pain) (Bilateral) (L>R)   5. Chronic occipital neuralgia (Location of Tertiary source of pain) (Bilateral) (L>R)   6. Chronic pain syndrome   7. Fibromyalgia   8. Long term prescription opiate use   9. Opiate use   10. Lumbar facet joint syndrome (B) (L>R)   11. Lumbar spondylosis   12. Chronic sacroiliac joint pain (Left)   13. Neurogenic pain   14. Muscle spasticity   15. Osteoarthritis of shoulder (B)     Problems updated and reviewed during this visit: Problem  Muscle Spasticity  Osteoarthritis of shoulder (B)   Plan of Care  Pharmacotherapy (Medications Ordered): Meds ordered this encounter  Medications  . HYDROcodone-acetaminophen (NORCO) 7.5-325 MG tablet    Sig: Take 1 tablet by mouth every 6 (six) hours as needed for moderate pain.    Dispense:  30 tablet    Refill:  0    Do not place this medication, or any other prescription from our practice, on "Automatic Refill". Patient may have prescription filled one day early if pharmacy is closed on scheduled refill date. Do not fill until: 04/01/17 To last until: 05/31/17  . pregabalin (LYRICA) 150 MG capsule    Sig: Take 1 capsule (150 mg total) by mouth every 8 (eight) hours.    Dispense:  90 capsule    Refill:  2    Do not add this medication to the electronic "Automatic Refill" notification system. Patient may have prescription filled one day early if pharmacy is closed on scheduled refill date.  Marland Kitchen DISCONTD: tiZANidine (ZANAFLEX) 2 MG tablet    Sig: Take 1 tablet (2 mg total) by mouth every  6 (six) hours as needed for muscle spasms.    Dispense:  120 tablet    Refill:  2    Do not add this medication to the electronic "Automatic Refill" notification system. Patient may have prescription filled one day early if pharmacy is closed on scheduled refill date.  . baclofen (LIORESAL) 10 MG tablet    Sig: Take 1 tablet (10 mg total) by mouth 3 (three) times daily.    Dispense:  90 tablet    Refill:  2    Do not place this medication, or any other prescription from our practice, on "Automatic Refill". Patient may have prescription filled one day early if pharmacy is closed on scheduled refill date.  . diclofenac sodium (VOLTAREN) 1 % GEL    Sig: Apply 2 g topically 4 (four) times daily.    Dispense:  1 Tube    Refill:  2    Do not place medication  on "Automatic Refill". Fill one day early if pharmacy is closed on scheduled refill date.   New Prescriptions   BACLOFEN (LIORESAL) 10 MG TABLET    Take 1 tablet (10 mg total) by mouth 3 (three) times daily.   DICLOFENAC SODIUM (VOLTAREN) 1 % GEL    Apply 2 g topically 4 (four) times daily.   Medications administered today: Ms. Dearman had no medications administered during this visit.  Lab-work, procedure(s), and/or referral(s): Orders Placed This Encounter  Procedures   Left L1-2 LESI #1  . LUMBAR FACET(MEDIAL BRANCH NERVE BLOCK) MBNB  . SACROILIAC JOINT INJECTINS  . GREATER OCCIPITAL NERVE BLOCK  . Comprehensive metabolic panel  . C-reactive protein  . Sedimentation rate  . Magnesium  . 25-Hydroxyvitamin D Lcms D2+D3  . Vitamin B12  . ToxASSURE Select 13 (MW), Urine    Interventional management options: Planned, scheduled, and/or pending:   Diagnostic Left L1-2 LESI #1, under fluoroscopy and IV sedation. (Pre-treat for Vagal reflex)   Considering:   Therapeutic left cervical epidural steroid injection#3  Palliative left L4-5 lumbar epidural steroid injections  Diagnostic bilateral lumbar facet block #2 Possible  bilateral lumbar facet radiofrequency ablation. Diagnostic thoracic facet block  Possible bilateral thoracic facet radiofrequency ablation. Diagnostic bilateral cervical facet blocks  Possible bilateral cervical facet radiofrequency ablation. Diagnostic bilateral lesser occipital nerve blocks  Diagnostic bilateral C2 and TON nerve block  Possible bilateral occipital nerve radiofrequency ablation. Diagnostic right T10-11 thoracic epidural steroid injection  Diagnostic bilateral suprascapular nerve block  Diagnostic bilateral intra-articular knee joint injection  Possible bilateral intra-articular Hyalgan knee injections.  Diagnostic bilateral Genicular nerve block  Possible bilateral Genicular nerve radiofrequency ablation.    Palliative PRN treatment(s):   Palliative left L4-5 lumbar epidural steroid injections  Diagnostic bilateral lumbar facet block  Diagnostic thoracic facet block  Palliative Left cervical epidural steroid injections  Diagnostic bilateral cervical facet blocks  Diagnostic bilateral lesser occipital nerve blocks  Diagnostic bilateral C2 and TON nerve block  Diagnostic right T10-11 thoracic epidural steroid injection  Diagnostic bilateral suprascapular nerve block  Diagnostic bilateral intra-articular knee joint injection  Diagnostic bilateral Genicular nerve block    Provider-requested follow-up: Return for procedure (w/ sedation), by MD, Med-Mgmt, (3 mo), by NP.  Future Appointments Date Time Provider [place]  04/07/2017 8:15 AM Milinda Pointer, MD [facility] None  07/02/2017 8:30 AM Vevelyn Francois, NP [facility] None   Primary Care Physician: Arnetha Courser, MD Location: [facility] Note by: Kathlen Brunswick Dossie Arbour, M.D, DABA, DABAPM, DABPM, DABIPP, FIPP Date: 04/01/2017; Time: 4:23 PM  Patient Instructions   ____________________________________________________________________________________________  Preparing for  Procedure with Sedation Instructions: . Oral Intake: Do not eat or drink anything for at least 8 hours prior to your procedure. . Transportation: Public transportation is not allowed. Bring an adult driver. The driver must be physically present in our waiting room before any procedure can be started. Marland Kitchen Physical Assistance: Bring an adult physically capable of assisting you, in the event you need help. This adult should keep you company at home for at least 6 hours after the procedure. . Blood Pressure Medicine: Take your blood pressure medicine with a sip of water the morning of the procedure. . Blood thinners:  . Diabetics on insulin: Notify the staff so that you can be scheduled 1st case in the morning. If your diabetes requires high dose insulin, take only  of your normal insulin dose the morning of the procedure and notify the staff that  you have done so. . Preventing infections: Shower with an antibacterial soap the morning of your procedure. . Build-up your immune system: Take 1000 mg of Vitamin C with every meal (3 times a day) the day prior to your procedure. Marland Kitchen Antibiotics: Inform the staff if you have a condition or reason that requires you to take antibiotics before dental procedures. . Pregnancy: If you are pregnant, call and cancel the procedure. . Sickness: If you have a cold, fever, or any active infections, call and cancel the procedure. . Arrival: You must be in the facility at least 30 minutes prior to your scheduled procedure. . Children: Do not bring children with you. . Dress appropriately: Bring dark clothing that you would not mind if they get stained. . Valuables: Do not bring any jewelry or valuables. Procedure appointments are reserved for interventional treatments only. Marland Kitchen No Prescription Refills. . No medication changes will be discussed during procedure appointments. . No disability issues will be  discussed. ____________________________________________________________________________________________  GENERAL RISKS AND COMPLICATIONS  What are the risk, side effects and possible complications? Generally speaking, most procedures are safe.  However, with any procedure there are risks, side effects, and the possibility of complications.  The risks and complications are dependent upon the sites that are lesioned, or the type of nerve block to be performed.  The closer the procedure is to the spine, the more serious the risks are.  Great care is taken when placing the radio frequency needles, block needles or lesioning probes, but sometimes complications can occur. 1. Infection: Any time there is an injection through the skin, there is a risk of infection.  This is why sterile conditions are used for these blocks.  There are four possible types of infection. 1. Localized skin infection. 2. Central Nervous System Infection-This can be in the form of Meningitis, which can be deadly. 3. Epidural Infections-This can be in the form of an epidural abscess, which can cause pressure inside of the spine, causing compression of the spinal cord with subsequent paralysis. This would require an emergency surgery to decompress, and there are no guarantees that the patient would recover from the paralysis. 4. Discitis-This is an infection of the intervertebral discs.  It occurs in about 1% of discography procedures.  It is difficult to treat and it may lead to surgery.        2. Pain: the needles have to go through skin and soft tissues, will cause soreness.       3. Damage to internal structures:  The nerves to be lesioned may be near blood vessels or    other nerves which can be potentially damaged.       4. Bleeding: Bleeding is more common if the patient is taking blood thinners such as  aspirin, Coumadin, Ticiid, Plavix, etc., or if he/she have some genetic predisposition  such as hemophilia. Bleeding into  the spinal canal can cause compression of the spinal  cord with subsequent paralysis.  This would require an emergency surgery to  decompress and there are no guarantees that the patient would recover from the  paralysis.       5. Pneumothorax:  Puncturing of a lung is a possibility, every time a needle is introduced in  the area of the chest or upper back.  Pneumothorax refers to free air around the  collapsed lung(s), inside of the thoracic cavity (chest cavity).  Another two possible  complications related to a similar event would include: Hemothorax and Chylothorax.  These are variations of the Pneumothorax, where instead of air around the collapsed  lung(s), you may have blood or chyle, respectively.       6. Spinal headaches: They may occur with any procedures in the area of the spine.       7. Persistent CSF (Cerebro-Spinal Fluid) leakage: This is a rare problem, but may occur  with prolonged intrathecal or epidural catheters either due to the formation of a fistulous  track or a dural tear.       8. Nerve damage: By working so close to the spinal cord, there is always a possibility of  nerve damage, which could be as serious as a permanent spinal cord injury with  paralysis.       9. Death:  Although rare, severe deadly allergic reactions known as "Anaphylactic  reaction" can occur to any of the medications used.      10. Worsening of the symptoms:  We can always make thing worse.  What are the chances of something like this happening? Chances of any of this occuring are extremely low.  By statistics, you have more of a chance of getting killed in a motor vehicle accident: while driving to the hospital than any of the above occurring .  Nevertheless, you should be aware that they are possibilities.  In general, it is similar to taking a shower.  Everybody knows that you can slip, hit your head and get killed.  Does that mean that you should not shower again?  Nevertheless always keep in mind that  statistics do not mean anything if you happen to be on the wrong side of them.  Even if a procedure has a 1 (one) in a 1,000,000 (million) chance of going wrong, it you happen to be that one..Also, keep in mind that by statistics, you have more of a chance of having something go wrong when taking medications.  Who should not have this procedure? If you are on a blood thinning medication (e.g. Coumadin, Plavix, see list of "Blood Thinners"), or if you have an active infection going on, you should not have the procedure.  If you are taking any blood thinners, please inform your physician.  How should I prepare for this procedure?  Do not eat or drink anything at least six hours prior to the procedure.  Bring a driver with you .  It cannot be a taxi.  Come accompanied by an adult that can drive you back, and that is strong enough to help you if your legs get weak or numb from the local anesthetic.  Take all of your medicines the morning of the procedure with just enough water to swallow them.  If you have diabetes, make sure that you are scheduled to have your procedure done first thing in the morning, whenever possible.  If you have diabetes, take only half of your insulin dose and notify our nurse that you have done so as soon as you arrive at the clinic.  If you are diabetic, but only take blood sugar pills (oral hypoglycemic), then do not take them on the morning of your procedure.  You may take them after you have had the procedure.  Do not take aspirin or any aspirin-containing medications, at least eleven (11) days prior to the procedure.  They may prolong bleeding.  Wear loose fitting clothing that may be easy to take off and that you would not mind if it got stained with Betadine or blood.  Do  not wear any jewelry or perfume  Remove any nail coloring.  It will interfere with some of our monitoring equipment.  NOTE: Remember that this is not meant to be interpreted as a complete  list of all possible complications.  Unforeseen problems may occur.  BLOOD THINNERS The following drugs contain aspirin or other products, which can cause increased bleeding during surgery and should not be taken for 2 weeks prior to and 1 week after surgery.  If you should need take something for relief of minor pain, you may take acetaminophen which is found in Tylenol,m Datril, Anacin-3 and Panadol. It is not blood thinner. The products listed below are.  Do not take any of the products listed below in addition to any listed on your instruction sheet.  A.P.C or A.P.C with Codeine Codeine Phosphate Capsules #3 Ibuprofen Ridaura  ABC compound Congesprin Imuran rimadil  Advil Cope Indocin Robaxisal  Alka-Seltzer Effervescent Pain Reliever and Antacid Coricidin or Coricidin-D  Indomethacin Rufen  Alka-Seltzer plus Cold Medicine Cosprin Ketoprofen S-A-C Tablets  Anacin Analgesic Tablets or Capsules Coumadin Korlgesic Salflex  Anacin Extra Strength Analgesic tablets or capsules CP-2 Tablets Lanoril Salicylate  Anaprox Cuprimine Capsules Levenox Salocol  Anexsia-D Dalteparin Magan Salsalate  Anodynos Darvon compound Magnesium Salicylate Sine-off  Ansaid Dasin Capsules Magsal Sodium Salicylate  Anturane Depen Capsules Marnal Soma  APF Arthritis pain formula Dewitt's Pills Measurin Stanback  Argesic Dia-Gesic Meclofenamic Sulfinpyrazone  Arthritis Bayer Timed Release Aspirin Diclofenac Meclomen Sulindac  Arthritis pain formula Anacin Dicumarol Medipren Supac  Analgesic (Safety coated) Arthralgen Diffunasal Mefanamic Suprofen  Arthritis Strength Bufferin Dihydrocodeine Mepro Compound Suprol  Arthropan liquid Dopirydamole Methcarbomol with Aspirin Synalgos  ASA tablets/Enseals Disalcid Micrainin Tagament  Ascriptin Doan's Midol Talwin  Ascriptin A/D Dolene Mobidin Tanderil  Ascriptin Extra Strength Dolobid Moblgesic Ticlid  Ascriptin with Codeine Doloprin or Doloprin with Codeine Momentum  Tolectin  Asperbuf Duoprin Mono-gesic Trendar  Aspergum Duradyne Motrin or Motrin IB Triminicin  Aspirin plain, buffered or enteric coated Durasal Myochrisine Trigesic  Aspirin Suppositories Easprin Nalfon Trillsate  Aspirin with Codeine Ecotrin Regular or Extra Strength Naprosyn Uracel  Atromid-S Efficin Naproxen Ursinus  Auranofin Capsules Elmiron Neocylate Vanquish  Axotal Emagrin Norgesic Verin  Azathioprine Empirin or Empirin with Codeine Normiflo Vitamin E  Azolid Emprazil Nuprin Voltaren  Bayer Aspirin plain, buffered or children's or timed BC Tablets or powders Encaprin Orgaran Warfarin Sodium  Buff-a-Comp Enoxaparin Orudis Zorpin  Buff-a-Comp with Codeine Equegesic Os-Cal-Gesic   Buffaprin Excedrin plain, buffered or Extra Strength Oxalid   Bufferin Arthritis Strength Feldene Oxphenbutazone   Bufferin plain or Extra Strength Feldene Capsules Oxycodone with Aspirin   Bufferin with Codeine Fenoprofen Fenoprofen Pabalate or Pabalate-SF   Buffets II Flogesic Panagesic   Buffinol plain or Extra Strength Florinal or Florinal with Codeine Panwarfarin   Buf-Tabs Flurbiprofen Penicillamine   Butalbital Compound Four-way cold tablets Penicillin   Butazolidin Fragmin Pepto-Bismol   Carbenicillin Geminisyn Percodan   Carna Arthritis Reliever Geopen Persantine   Carprofen Gold's salt Persistin   Chloramphenicol Goody's Phenylbutazone   Chloromycetin Haltrain Piroxlcam   Clmetidine heparin Plaquenil   Cllnoril Hyco-pap Ponstel   Clofibrate Hydroxy chloroquine Propoxyphen         Before stopping any of these medications, be sure to consult the physician who ordered them.  Some, such as Coumadin (Warfarin) are ordered to prevent or treat serious conditions such as "deep thrombosis", "pumonary embolisms", and other heart problems.  The amount of time that you may need off of the medication  may also vary with the medication and the reason for which you were taking it.  If you are taking any  of these medications, please make sure you notify your pain physician before you undergo any procedures.         Epidural Steroid Injection Patient Information  Description: The epidural space surrounds the nerves as they exit the spinal cord.  In some patients, the nerves can be compressed and inflamed by a bulging disc or a tight spinal canal (spinal stenosis).  By injecting steroids into the epidural space, we can bring irritated nerves into direct contact with a potentially helpful medication.  These steroids act directly on the irritated nerves and can reduce swelling and inflammation which often leads to decreased pain.  Epidural steroids may be injected anywhere along the spine and from the neck to the low back depending upon the location of your pain.   After numbing the skin with local anesthetic (like Novocaine), a small needle is passed into the epidural space slowly.  You may experience a sensation of pressure while this is being done.  The entire block usually last less than 10 minutes.  Conditions which may be treated by epidural steroids:   Low back and leg pain  Neck and arm pain  Spinal stenosis  Post-laminectomy syndrome  Herpes zoster (shingles) pain  Pain from compression fractures  Preparation for the injection:  1. Do not eat any solid food or dairy products within 8 hours of your appointment.  2. You may drink clear liquids up to 3 hours before appointment.  Clear liquids include water, black coffee, juice or soda.  No milk or cream please. 3. You may take your regular medication, including pain medications, with a sip of water before your appointment  Diabetics should hold regular insulin (if taken separately) and take 1/2 normal NPH dos the morning of the procedure.  Carry some sugar containing items with you to your appointment. 4. A driver must accompany you and be prepared to drive you home after your procedure.  5. Bring all your current medications  with your. 6. An IV may be inserted and sedation may be given at the discretion of the physician.   7. A blood pressure cuff, EKG and other monitors will often be applied during the procedure.  Some patients may need to have extra oxygen administered for a short period. 8. You will be asked to provide medical information, including your allergies, prior to the procedure.  We must know immediately if you are taking blood thinners (like Coumadin/Warfarin)  Or if you are allergic to IV iodine contrast (dye). We must know if you could possible be pregnant.  Possible side-effects:  Bleeding from needle site  Infection (rare, may require surgery)  Nerve injury (rare)  Numbness & tingling (temporary)  Difficulty urinating (rare, temporary)  Spinal headache ( a headache worse with upright posture)  Light -headedness (temporary)  Pain at injection site (several days)  Decreased blood pressure (temporary)  Weakness in arm/leg (temporary)  Pressure sensation in back/neck (temporary)  Call if you experience:  Fever/chills associated with headache or increased back/neck pain.  Headache worsened by an upright position.  New onset weakness or numbness of an extremity below the injection site  Hives or difficulty breathing (go to the emergency room)  Inflammation or drainage at the infection site  Severe back/neck pain  Any new symptoms which are concerning to you  Please note:  Although the local anesthetic injected can often  make your back or neck feel good for several hours after the injection, the pain will likely return.  It takes 3-7 days for steroids to work in the epidural space.  You may not notice any pain relief for at least that one week.  If effective, we will often do a series of three injections spaced 3-6 weeks apart to maximally decrease your pain.  After the initial series, we generally will wait several months before considering a repeat injection of the same  type.  If you have any questions, please call [phone] [facility]

## 2017-04-01 ENCOUNTER — Encounter: Payer: Self-pay | Admitting: Pain Medicine

## 2017-04-01 ENCOUNTER — Other Ambulatory Visit: Payer: Self-pay | Admitting: Nurse Practitioner

## 2017-04-01 ENCOUNTER — Ambulatory Visit: Payer: Medicaid Other | Attending: Pain Medicine | Admitting: Pain Medicine

## 2017-04-01 VITALS — BP 118/75 | HR 90 | Temp 98.5°F | Resp 16 | Ht 63.0 in | Wt 216.0 lb

## 2017-04-01 DIAGNOSIS — K625 Hemorrhage of anus and rectum: Secondary | ICD-10-CM | POA: Diagnosis not present

## 2017-04-01 DIAGNOSIS — Z79891 Long term (current) use of opiate analgesic: Secondary | ICD-10-CM

## 2017-04-01 DIAGNOSIS — I471 Supraventricular tachycardia: Secondary | ICD-10-CM | POA: Insufficient documentation

## 2017-04-01 DIAGNOSIS — M5481 Occipital neuralgia: Secondary | ICD-10-CM

## 2017-04-01 DIAGNOSIS — M797 Fibromyalgia: Secondary | ICD-10-CM

## 2017-04-01 DIAGNOSIS — M4696 Unspecified inflammatory spondylopathy, lumbar region: Secondary | ICD-10-CM

## 2017-04-01 DIAGNOSIS — N920 Excessive and frequent menstruation with regular cycle: Secondary | ICD-10-CM | POA: Insufficient documentation

## 2017-04-01 DIAGNOSIS — M47816 Spondylosis without myelopathy or radiculopathy, lumbar region: Secondary | ICD-10-CM | POA: Diagnosis not present

## 2017-04-01 DIAGNOSIS — M25512 Pain in left shoulder: Secondary | ICD-10-CM | POA: Insufficient documentation

## 2017-04-01 DIAGNOSIS — F411 Generalized anxiety disorder: Secondary | ICD-10-CM | POA: Insufficient documentation

## 2017-04-01 DIAGNOSIS — M549 Dorsalgia, unspecified: Secondary | ICD-10-CM

## 2017-04-01 DIAGNOSIS — K2930 Chronic superficial gastritis without bleeding: Secondary | ICD-10-CM | POA: Insufficient documentation

## 2017-04-01 DIAGNOSIS — M5416 Radiculopathy, lumbar region: Secondary | ICD-10-CM | POA: Diagnosis not present

## 2017-04-01 DIAGNOSIS — E669 Obesity, unspecified: Secondary | ICD-10-CM | POA: Diagnosis not present

## 2017-04-01 DIAGNOSIS — M5412 Radiculopathy, cervical region: Secondary | ICD-10-CM | POA: Insufficient documentation

## 2017-04-01 DIAGNOSIS — M62838 Other muscle spasm: Secondary | ICD-10-CM | POA: Insufficient documentation

## 2017-04-01 DIAGNOSIS — M25571 Pain in right ankle and joints of right foot: Secondary | ICD-10-CM | POA: Insufficient documentation

## 2017-04-01 DIAGNOSIS — E781 Pure hyperglyceridemia: Secondary | ICD-10-CM | POA: Insufficient documentation

## 2017-04-01 DIAGNOSIS — F1190 Opioid use, unspecified, uncomplicated: Secondary | ICD-10-CM

## 2017-04-01 DIAGNOSIS — M5442 Lumbago with sciatica, left side: Secondary | ICD-10-CM | POA: Diagnosis not present

## 2017-04-01 DIAGNOSIS — R51 Headache: Secondary | ICD-10-CM | POA: Insufficient documentation

## 2017-04-01 DIAGNOSIS — Z888 Allergy status to other drugs, medicaments and biological substances status: Secondary | ICD-10-CM | POA: Insufficient documentation

## 2017-04-01 DIAGNOSIS — R1031 Right lower quadrant pain: Secondary | ICD-10-CM | POA: Diagnosis not present

## 2017-04-01 DIAGNOSIS — M25511 Pain in right shoulder: Secondary | ICD-10-CM | POA: Insufficient documentation

## 2017-04-01 DIAGNOSIS — G894 Chronic pain syndrome: Secondary | ICD-10-CM | POA: Diagnosis not present

## 2017-04-01 DIAGNOSIS — M533 Sacrococcygeal disorders, not elsewhere classified: Secondary | ICD-10-CM

## 2017-04-01 DIAGNOSIS — M542 Cervicalgia: Secondary | ICD-10-CM

## 2017-04-01 DIAGNOSIS — G8929 Other chronic pain: Secondary | ICD-10-CM

## 2017-04-01 DIAGNOSIS — G4700 Insomnia, unspecified: Secondary | ICD-10-CM | POA: Insufficient documentation

## 2017-04-01 DIAGNOSIS — E559 Vitamin D deficiency, unspecified: Secondary | ICD-10-CM | POA: Insufficient documentation

## 2017-04-01 DIAGNOSIS — Z880 Allergy status to penicillin: Secondary | ICD-10-CM | POA: Insufficient documentation

## 2017-04-01 DIAGNOSIS — G4733 Obstructive sleep apnea (adult) (pediatric): Secondary | ICD-10-CM | POA: Insufficient documentation

## 2017-04-01 DIAGNOSIS — M19011 Primary osteoarthritis, right shoulder: Secondary | ICD-10-CM

## 2017-04-01 DIAGNOSIS — M25572 Pain in left ankle and joints of left foot: Secondary | ICD-10-CM | POA: Diagnosis not present

## 2017-04-01 DIAGNOSIS — Z79899 Other long term (current) drug therapy: Secondary | ICD-10-CM | POA: Insufficient documentation

## 2017-04-01 DIAGNOSIS — K219 Gastro-esophageal reflux disease without esophagitis: Secondary | ICD-10-CM | POA: Diagnosis not present

## 2017-04-01 DIAGNOSIS — M5441 Lumbago with sciatica, right side: Secondary | ICD-10-CM

## 2017-04-01 DIAGNOSIS — I10 Essential (primary) hypertension: Secondary | ICD-10-CM | POA: Diagnosis not present

## 2017-04-01 DIAGNOSIS — Z87891 Personal history of nicotine dependence: Secondary | ICD-10-CM | POA: Insufficient documentation

## 2017-04-01 DIAGNOSIS — M792 Neuralgia and neuritis, unspecified: Secondary | ICD-10-CM | POA: Diagnosis not present

## 2017-04-01 DIAGNOSIS — Z886 Allergy status to analgesic agent status: Secondary | ICD-10-CM | POA: Insufficient documentation

## 2017-04-01 DIAGNOSIS — M25552 Pain in left hip: Secondary | ICD-10-CM | POA: Insufficient documentation

## 2017-04-01 DIAGNOSIS — M19012 Primary osteoarthritis, left shoulder: Secondary | ICD-10-CM

## 2017-04-01 MED ORDER — TIZANIDINE HCL 2 MG PO TABS: 2.0000 mg | ORAL_TABLET | Freq: Four times a day (QID) | ORAL | 2 refills | Status: DC | PRN

## 2017-04-01 MED ORDER — PREGABALIN 150 MG PO CAPS: 150.0000 mg | ORAL_CAPSULE | Freq: Three times a day (TID) | ORAL | 2 refills | Status: DC

## 2017-04-01 MED ORDER — DICLOFENAC SODIUM 1 % TD GEL
2.0000 g | Freq: Four times a day (QID) | TRANSDERMAL | 2 refills | Status: DC
Start: 2017-04-01 — End: 2017-08-11

## 2017-04-01 MED ORDER — BACLOFEN 10 MG PO TABS: 10.0000 mg | ORAL_TABLET | Freq: Three times a day (TID) | ORAL | 2 refills | Status: DC

## 2017-04-01 MED ORDER — HYDROCODONE-ACETAMINOPHEN 7.5-325 MG PO TABS: 1.0000 | ORAL_TABLET | Freq: Four times a day (QID) | ORAL | 0 refills | Status: DC | PRN

## 2017-04-01 NOTE — Patient Instructions (Addendum)
____________________________________________________________________________________________  Preparing for Procedure with Sedation Instructions: . Oral Intake: Do not eat or drink anything for at least 8 hours prior to your procedure. . Transportation: Public transportation is not allowed. Bring an adult driver. The driver must be physically present in our waiting room before any procedure can be started. . Physical Assistance: Bring an adult physically capable of assisting you, in the event you need help. This adult should keep you company at home for at least 6 hours after the procedure. . Blood Pressure Medicine: Take your blood pressure medicine with a sip of water the morning of the procedure. . Blood thinners:  . Diabetics on insulin: Notify the staff so that you can be scheduled 1st case in the morning. If your diabetes requires high dose insulin, take only  of your normal insulin dose the morning of the procedure and notify the staff that you have done so. . Preventing infections: Shower with an antibacterial soap the morning of your procedure. . Build-up your immune system: Take 1000 mg of Vitamin C with every meal (3 times a day) the day prior to your procedure. . Antibiotics: Inform the staff if you have a condition or reason that requires you to take antibiotics before dental procedures. . Pregnancy: If you are pregnant, call and cancel the procedure. . Sickness: If you have a cold, fever, or any active infections, call and cancel the procedure. . Arrival: You must be in the facility at least 30 minutes prior to your scheduled procedure. . Children: Do not bring children with you. . Dress appropriately: Bring dark clothing that you would not mind if they get stained. . Valuables: Do not bring any jewelry or valuables. Procedure appointments are reserved for interventional treatments only. . No Prescription Refills. . No medication changes will be discussed during procedure  appointments. . No disability issues will be discussed. ____________________________________________________________________________________________  GENERAL RISKS AND COMPLICATIONS  What are the risk, side effects and possible complications? Generally speaking, most procedures are safe.  However, with any procedure there are risks, side effects, and the possibility of complications.  The risks and complications are dependent upon the sites that are lesioned, or the type of nerve block to be performed.  The closer the procedure is to the spine, the more serious the risks are.  Great care is taken when placing the radio frequency needles, block needles or lesioning probes, but sometimes complications can occur. 1. Infection: Any time there is an injection through the skin, there is a risk of infection.  This is why sterile conditions are used for these blocks.  There are four possible types of infection. 1. Localized skin infection. 2. Central Nervous System Infection-This can be in the form of Meningitis, which can be deadly. 3. Epidural Infections-This can be in the form of an epidural abscess, which can cause pressure inside of the spine, causing compression of the spinal cord with subsequent paralysis. This would require an emergency surgery to decompress, and there are no guarantees that the patient would recover from the paralysis. 4. Discitis-This is an infection of the intervertebral discs.  It occurs in about 1% of discography procedures.  It is difficult to treat and it may lead to surgery.        2. Pain: the needles have to go through skin and soft tissues, will cause soreness.       3. Damage to internal structures:  The nerves to be lesioned may be near blood vessels or      other nerves which can be potentially damaged.       4. Bleeding: Bleeding is more common if the patient is taking blood thinners such as  aspirin, Coumadin, Ticiid, Plavix, etc., or if he/she have some genetic  predisposition  such as hemophilia. Bleeding into the spinal canal can cause compression of the spinal  cord with subsequent paralysis.  This would require an emergency surgery to  decompress and there are no guarantees that the patient would recover from the  paralysis.       5. Pneumothorax:  Puncturing of a lung is a possibility, every time a needle is introduced in  the area of the chest or upper back.  Pneumothorax refers to free air around the  collapsed lung(s), inside of the thoracic cavity (chest cavity).  Another two possible  complications related to a similar event would include: Hemothorax and Chylothorax.   These are variations of the Pneumothorax, where instead of air around the collapsed  lung(s), you may have blood or chyle, respectively.       6. Spinal headaches: They may occur with any procedures in the area of the spine.       7. Persistent CSF (Cerebro-Spinal Fluid) leakage: This is a rare problem, but may occur  with prolonged intrathecal or epidural catheters either due to the formation of a fistulous  track or a dural tear.       8. Nerve damage: By working so close to the spinal cord, there is always a possibility of  nerve damage, which could be as serious as a permanent spinal cord injury with  paralysis.       9. Death:  Although rare, severe deadly allergic reactions known as "Anaphylactic  reaction" can occur to any of the medications used.      10. Worsening of the symptoms:  We can always make thing worse.  What are the chances of something like this happening? Chances of any of this occuring are extremely low.  By statistics, you have more of a chance of getting killed in a motor vehicle accident: while driving to the hospital than any of the above occurring .  Nevertheless, you should be aware that they are possibilities.  In general, it is similar to taking a shower.  Everybody knows that you can slip, hit your head and get killed.  Does that mean that you should not  shower again?  Nevertheless always keep in mind that statistics do not mean anything if you happen to be on the wrong side of them.  Even if a procedure has a 1 (one) in a 1,000,000 (million) chance of going wrong, it you happen to be that one..Also, keep in mind that by statistics, you have more of a chance of having something go wrong when taking medications.  Who should not have this procedure? If you are on a blood thinning medication (e.g. Coumadin, Plavix, see list of "Blood Thinners"), or if you have an active infection going on, you should not have the procedure.  If you are taking any blood thinners, please inform your physician.  How should I prepare for this procedure?  Do not eat or drink anything at least six hours prior to the procedure.  Bring a driver with you .  It cannot be a taxi.  Come accompanied by an adult that can drive you back, and that is strong enough to help you if your legs get weak or numb from the local anesthetic.  Take   all of your medicines the morning of the procedure with just enough water to swallow them.  If you have diabetes, make sure that you are scheduled to have your procedure done first thing in the morning, whenever possible.  If you have diabetes, take only half of your insulin dose and notify our nurse that you have done so as soon as you arrive at the clinic.  If you are diabetic, but only take blood sugar pills (oral hypoglycemic), then do not take them on the morning of your procedure.  You may take them after you have had the procedure.  Do not take aspirin or any aspirin-containing medications, at least eleven (11) days prior to the procedure.  They may prolong bleeding.  Wear loose fitting clothing that may be easy to take off and that you would not mind if it got stained with Betadine or blood.  Do not wear any jewelry or perfume  Remove any nail coloring.  It will interfere with some of our monitoring equipment.  NOTE: Remember that  this is not meant to be interpreted as a complete list of all possible complications.  Unforeseen problems may occur.  BLOOD THINNERS The following drugs contain aspirin or other products, which can cause increased bleeding during surgery and should not be taken for 2 weeks prior to and 1 week after surgery.  If you should need take something for relief of minor pain, you may take acetaminophen which is found in Tylenol,m Datril, Anacin-3 and Panadol. It is not blood thinner. The products listed below are.  Do not take any of the products listed below in addition to any listed on your instruction sheet.  A.P.C or A.P.C with Codeine Codeine Phosphate Capsules #3 Ibuprofen Ridaura  ABC compound Congesprin Imuran rimadil  Advil Cope Indocin Robaxisal  Alka-Seltzer Effervescent Pain Reliever and Antacid Coricidin or Coricidin-D  Indomethacin Rufen  Alka-Seltzer plus Cold Medicine Cosprin Ketoprofen S-A-C Tablets  Anacin Analgesic Tablets or Capsules Coumadin Korlgesic Salflex  Anacin Extra Strength Analgesic tablets or capsules CP-2 Tablets Lanoril Salicylate  Anaprox Cuprimine Capsules Levenox Salocol  Anexsia-D Dalteparin Magan Salsalate  Anodynos Darvon compound Magnesium Salicylate Sine-off  Ansaid Dasin Capsules Magsal Sodium Salicylate  Anturane Depen Capsules Marnal Soma  APF Arthritis pain formula Dewitt's Pills Measurin Stanback  Argesic Dia-Gesic Meclofenamic Sulfinpyrazone  Arthritis Bayer Timed Release Aspirin Diclofenac Meclomen Sulindac  Arthritis pain formula Anacin Dicumarol Medipren Supac  Analgesic (Safety coated) Arthralgen Diffunasal Mefanamic Suprofen  Arthritis Strength Bufferin Dihydrocodeine Mepro Compound Suprol  Arthropan liquid Dopirydamole Methcarbomol with Aspirin Synalgos  ASA tablets/Enseals Disalcid Micrainin Tagament  Ascriptin Doan's Midol Talwin  Ascriptin A/D Dolene Mobidin Tanderil  Ascriptin Extra Strength Dolobid Moblgesic Ticlid  Ascriptin with Codeine  Doloprin or Doloprin with Codeine Momentum Tolectin  Asperbuf Duoprin Mono-gesic Trendar  Aspergum Duradyne Motrin or Motrin IB Triminicin  Aspirin plain, buffered or enteric coated Durasal Myochrisine Trigesic  Aspirin Suppositories Easprin Nalfon Trillsate  Aspirin with Codeine Ecotrin Regular or Extra Strength Naprosyn Uracel  Atromid-S Efficin Naproxen Ursinus  Auranofin Capsules Elmiron Neocylate Vanquish  Axotal Emagrin Norgesic Verin  Azathioprine Empirin or Empirin with Codeine Normiflo Vitamin E  Azolid Emprazil Nuprin Voltaren  Bayer Aspirin plain, buffered or children's or timed BC Tablets or powders Encaprin Orgaran Warfarin Sodium  Buff-a-Comp Enoxaparin Orudis Zorpin  Buff-a-Comp with Codeine Equegesic Os-Cal-Gesic   Buffaprin Excedrin plain, buffered or Extra Strength Oxalid   Bufferin Arthritis Strength Feldene Oxphenbutazone   Bufferin plain or Extra Strength Feldene Capsules   Oxycodone with Aspirin   Bufferin with Codeine Fenoprofen Fenoprofen Pabalate or Pabalate-SF   Buffets II Flogesic Panagesic   Buffinol plain or Extra Strength Florinal or Florinal with Codeine Panwarfarin   Buf-Tabs Flurbiprofen Penicillamine   Butalbital Compound Four-way cold tablets Penicillin   Butazolidin Fragmin Pepto-Bismol   Carbenicillin Geminisyn Percodan   Carna Arthritis Reliever Geopen Persantine   Carprofen Gold's salt Persistin   Chloramphenicol Goody's Phenylbutazone   Chloromycetin Haltrain Piroxlcam   Clmetidine heparin Plaquenil   Cllnoril Hyco-pap Ponstel   Clofibrate Hydroxy chloroquine Propoxyphen         Before stopping any of these medications, be sure to consult the physician who ordered them.  Some, such as Coumadin (Warfarin) are ordered to prevent or treat serious conditions such as "deep thrombosis", "pumonary embolisms", and other heart problems.  The amount of time that you may need off of the medication may also vary with the medication and the reason for which  you were taking it.  If you are taking any of these medications, please make sure you notify your pain physician before you undergo any procedures.         Epidural Steroid Injection Patient Information  Description: The epidural space surrounds the nerves as they exit the spinal cord.  In some patients, the nerves can be compressed and inflamed by a bulging disc or a tight spinal canal (spinal stenosis).  By injecting steroids into the epidural space, we can bring irritated nerves into direct contact with a potentially helpful medication.  These steroids act directly on the irritated nerves and can reduce swelling and inflammation which often leads to decreased pain.  Epidural steroids may be injected anywhere along the spine and from the neck to the low back depending upon the location of your pain.   After numbing the skin with local anesthetic (like Novocaine), a small needle is passed into the epidural space slowly.  You may experience a sensation of pressure while this is being done.  The entire block usually last less than 10 minutes.  Conditions which may be treated by epidural steroids:   Low back and leg pain  Neck and arm pain  Spinal stenosis  Post-laminectomy syndrome  Herpes zoster (shingles) pain  Pain from compression fractures  Preparation for the injection:  1. Do not eat any solid food or dairy products within 8 hours of your appointment.  2. You may drink clear liquids up to 3 hours before appointment.  Clear liquids include water, black coffee, juice or soda.  No milk or cream please. 3. You may take your regular medication, including pain medications, with a sip of water before your appointment  Diabetics should hold regular insulin (if taken separately) and take 1/2 normal NPH dos the morning of the procedure.  Carry some sugar containing items with you to your appointment. 4. A driver must accompany you and be prepared to drive you home after your procedure.   5. Bring all your current medications with your. 6. An IV may be inserted and sedation may be given at the discretion of the physician.   7. A blood pressure cuff, EKG and other monitors will often be applied during the procedure.  Some patients may need to have extra oxygen administered for a short period. 8. You will be asked to provide medical information, including your allergies, prior to the procedure.  We must know immediately if you are taking blood thinners (like Coumadin/Warfarin)  Or if you are allergic to   IV iodine contrast (dye). We must know if you could possible be pregnant.  Possible side-effects:  Bleeding from needle site  Infection (rare, may require surgery)  Nerve injury (rare)  Numbness & tingling (temporary)  Difficulty urinating (rare, temporary)  Spinal headache ( a headache worse with upright posture)  Light -headedness (temporary)  Pain at injection site (several days)  Decreased blood pressure (temporary)  Weakness in arm/leg (temporary)  Pressure sensation in back/neck (temporary)  Call if you experience:  Fever/chills associated with headache or increased back/neck pain.  Headache worsened by an upright position.  New onset weakness or numbness of an extremity below the injection site  Hives or difficulty breathing (go to the emergency room)  Inflammation or drainage at the infection site  Severe back/neck pain  Any new symptoms which are concerning to you  Please note:  Although the local anesthetic injected can often make your back or neck feel good for several hours after the injection, the pain will likely return.  It takes 3-7 days for steroids to work in the epidural space.  You may not notice any pain relief for at least that one week.  If effective, we will often do a series of three injections spaced 3-6 weeks apart to maximally decrease your pain.  After the initial series, we generally will wait several months before  considering a repeat injection of the same type.  If you have any questions, please call [phone] [facility]   

## 2017-04-01 NOTE — Progress Notes (Deleted)
Nursing Pain Medication Assessment:  On Dana Bradley regular appointment, she did not comply with our medication refill policy of bringing her pills and bottle(s) to be examined and counted. As a consequence, her prescriptions were written and held, until she could bring back the medications to be examined. She comes in now to comply with this requirement. Prescriptions: Written prescriptions to last for *** given to patient once pill count and bottle inspection was completed.  Date: 04/01/17; Time: 1:58 PM

## 2017-04-01 NOTE — Progress Notes (Signed)
Nursing Pain Medication Assessment:  Safety precautions to be maintained throughout the outpatient stay will include: orient to surroundings, keep bed in low position, maintain call bell within reach at all times, provide assistance with transfer out of bed and ambulation.  Medication Inspection Compliance: Ms. Wolters did not comply with our request to bring her pills to be counted. She was reminded that bringing the medication bottles, even when empty, is a requirement.  Medication: None brought in. Pill/Patch Count: None available to be counted. Bottle Appearance: No container available. Did not bring bottle(s) to appointment. Filled Date: N/A Last Medication intake:  Yesterday

## 2017-04-01 NOTE — Progress Notes (Deleted)
Nursing Pain Medication Assessment:  Safety precautions to be maintained throughout the outpatient stay will include: orient to surroundings, keep bed in low position, maintain call bell within reach at all times, provide assistance with transfer out of bed and ambulation.  Medication Inspection Compliance: Dana Bradley did not comply with our request to bring her pills to be counted. She was reminded that bringing the medication bottles, even when empty, is a requirement.  Medication: None brought in. Pill/Patch Count: None available to be counted. Bottle Appearance: No container available. Did not bring bottle(s) to appointment. Filled Date: N/A Last Medication intake:  {Blank single:19197::"Ran out of medicine more than 48 hours ago","Day before yesterday","Yesterday","Today"}

## 2017-04-05 LAB — COMPREHENSIVE METABOLIC PANEL
ALT: 37 IU/L — AB (ref 0–32)
AST: 25 IU/L (ref 0–40)
Albumin/Globulin Ratio: 1.6 (ref 1.2–2.2)
Albumin: 4.1 g/dL (ref 3.5–5.5)
Alkaline Phosphatase: 84 IU/L (ref 39–117)
BILIRUBIN TOTAL: 0.5 mg/dL (ref 0.0–1.2)
BUN/Creatinine Ratio: 18 (ref 9–23)
BUN: 12 mg/dL (ref 6–24)
CALCIUM: 9.7 mg/dL (ref 8.7–10.2)
CHLORIDE: 101 mmol/L (ref 96–106)
CO2: 24 mmol/L (ref 20–29)
Creatinine, Ser: 0.67 mg/dL (ref 0.57–1.00)
GFR, EST AFRICAN AMERICAN: 123 mL/min/{1.73_m2} (ref >59)
GFR, EST NON AFRICAN AMERICAN: 106 mL/min/{1.73_m2} (ref >59)
GLUCOSE: 92 mg/dL (ref 65–99)
Globulin, Total: 2.5 g/dL (ref 1.5–4.5)
Potassium: 3.9 mmol/L (ref 3.5–5.2)
Sodium: 141 mmol/L (ref 134–144)
TOTAL PROTEIN: 6.6 g/dL (ref 6.0–8.5)

## 2017-04-05 LAB — VITAMIN B12: Vitamin B-12: 606 pg/mL (ref 232–1245)

## 2017-04-05 LAB — MAGNESIUM: Magnesium: 2.1 mg/dL (ref 1.6–2.3)

## 2017-04-05 LAB — SEDIMENTATION RATE: SED RATE: 11 mm/h (ref 0–32)

## 2017-04-05 LAB — 25-HYDROXYVITAMIN D LCMS D2+D3
25-HYDROXY, VITAMIN D-2: 5.4 ng/mL
25-HYDROXY, VITAMIN D-3: 15 ng/mL

## 2017-04-05 LAB — C-REACTIVE PROTEIN: CRP: 10.5 mg/L — AB (ref 0.0–4.9)

## 2017-04-05 LAB — 25-HYDROXY VITAMIN D LCMS D2+D3: 25-Hydroxy, Vitamin D: 20 ng/mL — ABNORMAL LOW

## 2017-04-07 ENCOUNTER — Ambulatory Visit: Payer: Medicaid Other | Admitting: Pain Medicine

## 2017-04-07 ENCOUNTER — Encounter: Payer: Self-pay | Admitting: Pain Medicine

## 2017-04-07 ENCOUNTER — Ambulatory Visit
Admission: RE | Admit: 2017-04-07 | Discharge: 2017-04-07 | Disposition: A | Discharge status: Home / Self Care | Payer: Medicaid Other | Source: Ambulatory Visit | Attending: Pain Medicine | Admitting: Pain Medicine

## 2017-04-07 VITALS — BP 108/73 | HR 70 | Temp 98.5°F | Resp 16 | Ht 63.0 in | Wt 215.0 lb

## 2017-04-07 DIAGNOSIS — M5126 Other intervertebral disc displacement, lumbar region: Secondary | ICD-10-CM

## 2017-04-07 DIAGNOSIS — M25551 Pain in right hip: Secondary | ICD-10-CM | POA: Diagnosis not present

## 2017-04-07 DIAGNOSIS — Z880 Allergy status to penicillin: Secondary | ICD-10-CM | POA: Diagnosis not present

## 2017-04-07 DIAGNOSIS — Z886 Allergy status to analgesic agent status: Secondary | ICD-10-CM | POA: Diagnosis not present

## 2017-04-07 DIAGNOSIS — G8918 Other acute postprocedural pain: Secondary | ICD-10-CM

## 2017-04-07 DIAGNOSIS — M5441 Lumbago with sciatica, right side: Secondary | ICD-10-CM | POA: Diagnosis present

## 2017-04-07 DIAGNOSIS — M47816 Spondylosis without myelopathy or radiculopathy, lumbar region: Secondary | ICD-10-CM | POA: Diagnosis not present

## 2017-04-07 DIAGNOSIS — M5116 Intervertebral disc disorders with radiculopathy, lumbar region: Secondary | ICD-10-CM | POA: Diagnosis not present

## 2017-04-07 DIAGNOSIS — M25552 Pain in left hip: Secondary | ICD-10-CM | POA: Diagnosis not present

## 2017-04-07 DIAGNOSIS — G8929 Other chronic pain: Secondary | ICD-10-CM | POA: Diagnosis not present

## 2017-04-07 DIAGNOSIS — M5442 Lumbago with sciatica, left side: Principal | ICD-10-CM

## 2017-04-07 DIAGNOSIS — R200 Anesthesia of skin: Secondary | ICD-10-CM | POA: Insufficient documentation

## 2017-04-07 DIAGNOSIS — M5416 Radiculopathy, lumbar region: Secondary | ICD-10-CM

## 2017-04-07 HISTORY — DX: Other acute postprocedural pain: G89.18

## 2017-04-07 LAB — TOXASSURE SELECT 13 (MW), URINE

## 2017-04-07 MED ORDER — TRIAMCINOLONE ACETONIDE 40 MG/ML IJ SUSP
40.0000 mg | Freq: Once | INTRAMUSCULAR | Status: AC
Start: 2017-04-07 — End: 2017-04-07
  Administered 2017-04-07: 40 mg

## 2017-04-07 MED ORDER — LIDOCAINE HCL (PF) 1 % IJ SOLN
5.0000 mL | Freq: Once | INTRAMUSCULAR | Status: AC
  Administered 2017-04-07: 5 mL

## 2017-04-07 MED ORDER — ROPIVACAINE HCL 2 MG/ML IJ SOLN
INTRAMUSCULAR | Status: AC
  Filled 2017-04-07: qty 10

## 2017-04-07 MED ORDER — TRIAMCINOLONE ACETONIDE 40 MG/ML IJ SUSP
INTRAMUSCULAR | Status: AC
  Filled 2017-04-07: qty 1

## 2017-04-07 MED ORDER — ROPIVACAINE HCL 2 MG/ML IJ SOLN
2.0000 mL | Freq: Once | INTRAMUSCULAR | Status: AC
  Administered 2017-04-07: 10 mL via EPIDURAL

## 2017-04-07 MED ORDER — ORPHENADRINE CITRATE 30 MG/ML IJ SOLN
INTRAMUSCULAR | Status: AC
  Filled 2017-04-07: qty 2

## 2017-04-07 MED ORDER — FENTANYL CITRATE (PF) 100 MCG/2ML IJ SOLN
25.0000 ug | INTRAMUSCULAR | Status: DC | PRN
Start: 2017-04-07 — End: 2017-04-07

## 2017-04-07 MED ORDER — MIDAZOLAM HCL 5 MG/5ML IJ SOLN
1.0000 mg | INTRAMUSCULAR | Status: DC | PRN
  Administered 2017-04-07: 3 mg via INTRAVENOUS

## 2017-04-07 MED ORDER — GLYCOPYRROLATE 0.2 MG/ML IJ SOLN
0.2000 mg | Freq: Once | INTRAMUSCULAR | Status: AC
  Administered 2017-04-07: 0.2 mg via INTRAVENOUS

## 2017-04-07 MED ORDER — ORPHENADRINE CITRATE 30 MG/ML IJ SOLN: 60.0000 mg | Freq: Once | INTRAMUSCULAR | Status: DC

## 2017-04-07 MED ORDER — MIDAZOLAM HCL 5 MG/5ML IJ SOLN
INTRAMUSCULAR | Status: AC
  Filled 2017-04-07: qty 5

## 2017-04-07 MED ORDER — FENTANYL CITRATE (PF) 100 MCG/2ML IJ SOLN
INTRAMUSCULAR | Status: AC
  Filled 2017-04-07: qty 2

## 2017-04-07 MED ORDER — LIDOCAINE HCL (PF) 1 % IJ SOLN
INTRAMUSCULAR | Status: AC
Start: 2017-04-07 — End: ?
  Filled 2017-04-07: qty 5

## 2017-04-07 MED ORDER — LACTATED RINGERS IV SOLN
1000.0000 mL | Freq: Once | INTRAVENOUS | Status: AC
  Administered 2017-04-07: 1000 mL via INTRAVENOUS

## 2017-04-07 MED ORDER — SODIUM CHLORIDE 0.9% FLUSH
2.0000 mL | Freq: Once | INTRAVENOUS | Status: AC
  Administered 2017-04-07: 10 mL

## 2017-04-07 MED ORDER — GLYCOPYRROLATE 0.2 MG/ML IJ SOLN
INTRAMUSCULAR | Status: AC
  Filled 2017-04-07: qty 1

## 2017-04-07 NOTE — Progress Notes (Signed)
Patient's Name: Dana Bradley  MRN: [id number]  Referring Provider: Arnetha Courser, MD  DOB: 1971/07/28  PCP: Arnetha Courser, MD  DOS: 04/07/2017  Note by: Kathlen Brunswick. Dossie Arbour, MD  Service setting: Ambulatory outpatient  Specialty: Interventional Pain Management  Patient type: Established  Location: [facility] (AMB) Pain Management Facility  Visit type: Interventional Procedure   Primary Reason for Visit: Interventional Pain Management Treatment. CC: Back Pain (lower and mid); Hip Pain (both hips; more on left); and Knee Pain (both)  Procedure:  Anesthesia, Analgesia, Anxiolysis:  Type: Diagnostic Inter-Laminar Epidural Steroid Injection Region: Lumbar Level: L1-2 Level. Laterality: Left-Sided Paramedial  Type: Local Anesthesia with Moderate (Conscious) Sedation Local Anesthetic: Lidocaine 1% Route: Intravenous (IV) IV Access: Secured Sedation: Meaningful verbal contact was maintained at all times during the procedure  Indication(s): Analgesia and Anxiety  Indications: 1. Chronic low back pain (Location of Primary Source of Pain) (Bilateral) (R>L) (midline)   2. Left lumbar radiculitis   3. Lumbar spondylosis   4. Lumbar L1-2 disc protrusion (Right)   5. Acute postoperative pain    Pain Score: Pre-procedure: 1 /10 Post-procedure: 2 /10, however she indicates that the pain in the lower back remains and she does have some numbness around the area were reported the L1-2 lumbar epidural steroid injection.  Pre-op Assessment:  Previous date of service: 04/01/17 Service provided: Med Refill, Evaluation (post procedure eval) Ms. Stoney is a 46 y.o. (year old), female patient, seen today for interventional treatment. She  has a past surgical history that includes Ablation; Tubal ligation (10/01/99); Cardiac catheterization; Knee arthroscopy; Colonoscopy with propofol (N/A, 05/17/2015); Esophagogastroduodenoscopy (N/A, 05/17/2015); and spg. Ms. Huq has a current medication list which includes the  following prescription(s): alprazolam, amitriptyline, atorvastatin, baclofen, calcium-vitamin d-vitamin k, cyanocobalamin, diclofenac sodium, hydrocodone-acetaminophen, lamotrigine, magnesium, metoprolol tartrate, omega-3 acid ethyl esters, and pregabalin, and the following Facility-Administered Medications: fentanyl, midazolam, and orphenadrine. Her primarily concern today is the Back Pain (lower and mid); Hip Pain (both hips; more on left); and Knee Pain (both)  Initial Vital Signs: There were no vitals taken for this visit. BMI: 38.09 kg/m  Risk Assessment: Allergies: Reviewed. She is allergic to aspirin; cymbalta [duloxetine hcl]; demerol [meperidine]; depakote [divalproex sodium]; haloperidol; iodinated diagnostic agents; reglan [metoclopramide]; tramadol hcl; trazodone; compazine [prochlorperazine]; meloxicam; penicillins; tomato; bacitracin-neomycin-polymyxin; cephalosporins; ibuprofen; latex; neosporin [neomycin-bacitracin zn-polymyx]; nsaids; and sulfonamide derivatives.  Allergy Precautions: No radiological contrast used Coagulopathies: Reviewed. None identified.  Blood-thinner therapy: None at this time Active Infection(s): Reviewed. None identified. Ms. Marlar is afebrile  Site Confirmation: Ms. Gracey was asked to confirm the procedure and laterality before marking the site Procedure checklist: Completed Consent: Before the procedure and under the influence of no sedative(s), amnesic(s), or anxiolytics, the patient was informed of the treatment options, risks and possible complications. To fulfill our ethical and legal obligations, as recommended by the American Medical Association's Code of Ethics, I have informed the patient of my clinical impression; the nature and purpose of the treatment or procedure; the risks, benefits, and possible complications of the intervention; the alternatives, including doing nothing; the risk(s) and benefit(s) of the alternative treatment(s) or procedure(s);  and the risk(s) and benefit(s) of doing nothing. The patient was provided information about the general risks and possible complications associated with the procedure. These may include, but are not limited to: failure to achieve desired goals, infection, bleeding, organ or nerve damage, allergic reactions, paralysis, and death. In addition, the patient was informed of those risks and complications associated to Spine-related procedures,  such as failure to decrease pain; infection (i.e.: Meningitis, epidural or intraspinal abscess); bleeding (i.e.: epidural hematoma, subarachnoid hemorrhage, or any other type of intraspinal or peri-dural bleeding); organ or nerve damage (i.e.: Any type of peripheral nerve, nerve root, or spinal cord injury) with subsequent damage to sensory, motor, and/or autonomic systems, resulting in permanent pain, numbness, and/or weakness of one or several areas of the body; allergic reactions; (i.e.: anaphylactic reaction); and/or death. Furthermore, the patient was informed of those risks and complications associated with the medications. These include, but are not limited to: allergic reactions (i.e.: anaphylactic or anaphylactoid reaction(s)); adrenal axis suppression; blood sugar elevation that in diabetics may result in ketoacidosis or comma; water retention that in patients with history of congestive heart failure may result in shortness of breath, pulmonary edema, and decompensation with resultant heart failure; weight gain; swelling or edema; medication-induced neural toxicity; particulate matter embolism and blood vessel occlusion with resultant organ, and/or nervous system infarction; and/or aseptic necrosis of one or more joints. Finally, the patient was informed that Medicine is not an exact science; therefore, there is also the possibility of unforeseen or unpredictable risks and/or possible complications that may result in a catastrophic outcome. The patient indicated having  understood very clearly. We have given the patient no guarantees and we have made no promises. Enough time was given to the patient to ask questions, all of which were answered to the patient's satisfaction. Ms. Molnar has indicated that she wanted to continue with the procedure. Attestation: I, the ordering provider, attest that I have discussed with the patient the benefits, risks, side-effects, alternatives, likelihood of achieving goals, and potential problems during recovery for the procedure that I have provided informed consent. Date: 04/07/2017; Time: 8:16 AM  Pre-Procedure Preparation:  Monitoring: As per clinic protocol. Respiration, ETCO2, SpO2, BP, heart rate and rhythm monitor placed and checked for adequate function Safety Precautions: Patient was assessed for positional comfort and pressure points before starting the procedure. Time-out: I initiated and conducted the "Time-out" before starting the procedure, as per protocol. The patient was asked to participate by confirming the accuracy of the "Time Out" information. Verification of the correct person, site, and procedure were performed and confirmed by me, the nursing staff, and the patient. "Time-out" conducted as per Joint Commission's Universal Protocol (UP.01.01.01). "Time-out" Date & Time: 04/07/2017; 0928 hrs.  Description of Procedure Process:   Position: Prone with head of the table was raised to facilitate breathing. Target Area: The interlaminar space, initially targeting the lower laminar border of the superior vertebral body. Approach: Paramedial approach. Area Prepped: Entire Posterior Lumbar Region Prepping solution: ChloraPrep (2% chlorhexidine gluconate and 70% isopropyl alcohol) Safety Precautions: Aspiration looking for blood return was conducted prior to all injections. At no point did we inject any substances, as a needle was being advanced. No attempts were made at seeking any paresthesias. Safe injection practices  and needle disposal techniques used. Medications properly checked for expiration dates. SDV (single dose vial) medications used. Description of the Procedure: Protocol guidelines were followed. The procedure needle was introduced through the skin, ipsilateral to the reported pain, and advanced to the target area. Bone was contacted and the needle walked caudad, until the lamina was cleared. The epidural space was identified using "loss-of-resistance technique" with 2-3 ml of PF-NaCl (0.9% NSS), in a 5cc LOR glass syringe. Vitals:   04/07/17 0938 04/07/17 0947 04/07/17 0957 04/07/17 1007  BP: 120/79 107/66 103/63 108/73  Pulse:  75 72 70  Resp: 13  16 16 16   Temp:      SpO2: 100% 99% 99% 99%  Weight:      Height:        Start Time: 0928 hrs. End Time: 0938 hrs. Materials:  Needle(s) Type: Epidural needle Gauge: 17G Length: 3.5-in Medication(s): We administered lactated ringers, midazolam, triamcinolone acetonide, ropivacaine (PF) 2 mg/mL (0.2%), sodium chloride flush, lidocaine (PF), and glycopyrrolate. Please see chart orders for dosing details.  Imaging Guidance (Spinal):  Type of Imaging Technique: Fluoroscopy Guidance (Spinal) Indication(s): Assistance in needle guidance and placement for procedures requiring needle placement in or near specific anatomical locations not easily accessible without such assistance. Exposure Time: Please see nurses notes. Contrast: Before injecting any contrast, we confirmed that the patient did not have an allergy to iodine, shellfish, or radiological contrast. Once satisfactory needle placement was completed at the desired level, radiological contrast was injected. Contrast injected under live fluoroscopy. No contrast complications. See chart for type and volume of contrast used. Fluoroscopic Guidance: I was personally present during the use of fluoroscopy. "Tunnel Vision Technique" used to obtain the best possible view of the target area. Parallax error  corrected before commencing the procedure. "Direction-depth-direction" technique used to introduce the needle under continuous pulsed fluoroscopy. Once target was reached, antero-posterior, oblique, and lateral fluoroscopic projection used confirm needle placement in all planes. Images permanently stored in EMR. Interpretation: I personally interpreted the imaging intraoperatively. Adequate needle placement confirmed in multiple planes. Appropriate spread of contrast into desired area was observed. No evidence of afferent or efferent intravascular uptake. No intrathecal or subarachnoid spread observed. Permanent images saved into the patient's record.  Antibiotic Prophylaxis:  Indication(s): None identified Antibiotic given: None  Post-operative Assessment:  EBL: None Complications: No immediate post-treatment complications observed by team, or reported by patient. Note: The patient tolerated the entire procedure well. A repeat set of vitals were taken after the procedure and the patient was kept under observation following institutional policy, for this type of procedure. Post-procedural neurological assessment was performed, showing return to baseline, prior to discharge. The patient was provided with post-procedure discharge instructions, including a section on how to identify potential problems. Should any problems arise concerning this procedure, the patient was given instructions to immediately contact us, at any time, without hesitation. In any case, we plan to contact the patient by telephone for a follow-up status report regarding this interventional procedure. Comments:  No additional relevant information.  Plan of Care  Disposition: Discharge home  Discharge Date & Time: 04/07/2017; 1026 hrs.  Physician-requested Follow-up:  No Follow-up on file.  Future Appointments Date Time Provider [place]  04/24/2017 8:00 AM Milinda Pointer, MD [facility] None  07/02/2017 8:30 AM Vevelyn Francois, NP [facility] None   Medications ordered for procedure: Meds ordered this encounter  Medications  . lactated ringers infusion 1,000 mL  . midazolam (VERSED) 5 MG/5ML injection 1-2 mg    Make sure Flumazenil is available in the pyxis when using this medication. If oversedation occurs, administer 0.2 mg IV over 15 sec. If after 45 sec no response, administer 0.2 mg again over 1 min; may repeat at 1 min intervals; not to exceed 4 doses (1 mg)  . fentaNYL (SUBLIMAZE) injection 25-50 mcg    Make sure Narcan is available in the pyxis when using this medication. In the event of respiratory depression (RR< 8/min): Titrate NARCAN (naloxone) in increments of 0.1 to 0.2 mg IV at 2-3 minute intervals, until desired degree of reversal.  . triamcinolone  acetonide (KENALOG-40) injection 40 mg  . ropivacaine (PF) 2 mg/mL (0.2%) (NAROPIN) injection 2 mL  . sodium chloride flush (NS) 0.9 % injection 2 mL  . lidocaine (PF) (XYLOCAINE) 1 % injection 5 mL  . glycopyrrolate (ROBINUL) injection 0.2 mg  . orphenadrine (NORFLEX) injection 60 mg   Medications administered: We administered lactated ringers, midazolam, triamcinolone acetonide, ropivacaine (PF) 2 mg/mL (0.2%), sodium chloride flush, lidocaine (PF), and glycopyrrolate.  See the medical record for exact dosing, route, and time of administration.  Lab-work, Procedure(s), & Referral(s) Ordered: Orders Placed This Encounter  Procedures  . Lumbar Epidural Injection  . DG C-Arm 1-60 Min-No Report  . Informed Consent Details: Transcribe to consent form and obtain patient signature  . Provider attestation of informed consent for procedure/surgical case  . Verify informed consent  . Discharge instructions  . Follow-up   Imaging Ordered: Results for orders placed in visit on 01/06/17  DG C-Arm 1-60 Min-No Report   Narrative Fluoroscopy was utilized by the requesting physician.  No radiographic  interpretation.    New Prescriptions   No  medications on file   Primary Care Physician: Arnetha Courser, MD Location: [facility] Note by: Kathlen Brunswick. Dossie Arbour, M.D, DABA, DABAPM, DABPM, DABIPP, FIPP Date: 04/07/2017; Time: 1:58 PM  Disclaimer:  Medicine is not an Chief Strategy Officer. The only guarantee in medicine is that nothing is guaranteed. It is important to note that the decision to proceed with this intervention was based on the information collected from the patient. The Data and conclusions were drawn from the patient's questionnaire, the interview, and the physical examination. Because the information was provided in large part by the patient, it cannot be guaranteed that it has not been purposely or unconsciously manipulated. Every effort has been made to obtain as much relevant data as possible for this evaluation. It is important to note that the conclusions that lead to this procedure are derived in large part from the available data. Always take into account that the treatment will also be dependent on availability of resources and existing treatment guidelines, considered by other Pain Management Practitioners as being common knowledge and practice, at the time of the intervention. For Medico-Legal purposes, it is also important to point out that variation in procedural techniques and pharmacological choices are the acceptable norm. The indications, contraindications, technique, and results of the above procedure should only be interpreted and judged by a Board-Certified Interventional Pain Specialist with extensive familiarity and expertise in the same exact procedure and technique.  Instructions provided at this appointment: There are no Patient Instructions on file for this visit.

## 2017-04-08 ENCOUNTER — Telehealth: Payer: Self-pay

## 2017-04-08 NOTE — Telephone Encounter (Signed)
Post procedure phone call..  States she is doing better today.

## 2017-04-14 ENCOUNTER — Ambulatory Visit: Payer: Self-pay | Admitting: Family Medicine

## 2017-04-15 ENCOUNTER — Emergency Department: Payer: Medicaid Other

## 2017-04-15 ENCOUNTER — Encounter: Payer: Self-pay | Admitting: Emergency Medicine

## 2017-04-15 ENCOUNTER — Encounter: Payer: Self-pay | Admitting: Family Medicine

## 2017-04-15 ENCOUNTER — Emergency Department
Admission: EM | Admit: 2017-04-15 | Discharge: 2017-04-15 | Disposition: A | Discharge status: Home / Self Care | Payer: Medicaid Other | Attending: Emergency Medicine | Admitting: Emergency Medicine

## 2017-04-15 ENCOUNTER — Ambulatory Visit (INDEPENDENT_AMBULATORY_CARE_PROVIDER_SITE_OTHER): Payer: Medicaid Other | Admitting: Family Medicine

## 2017-04-15 VITALS — BP 116/78 | HR 98 | Temp 98.3°F | Resp 14 | Wt 219.8 lb

## 2017-04-15 DIAGNOSIS — Z87891 Personal history of nicotine dependence: Secondary | ICD-10-CM | POA: Insufficient documentation

## 2017-04-15 DIAGNOSIS — R5383 Other fatigue: Secondary | ICD-10-CM

## 2017-04-15 DIAGNOSIS — R002 Palpitations: Secondary | ICD-10-CM

## 2017-04-15 DIAGNOSIS — R0789 Other chest pain: Secondary | ICD-10-CM | POA: Insufficient documentation

## 2017-04-15 DIAGNOSIS — E559 Vitamin D deficiency, unspecified: Secondary | ICD-10-CM

## 2017-04-15 DIAGNOSIS — R079 Chest pain, unspecified: Secondary | ICD-10-CM | POA: Diagnosis present

## 2017-04-15 DIAGNOSIS — Z8679 Personal history of other diseases of the circulatory system: Secondary | ICD-10-CM | POA: Diagnosis not present

## 2017-04-15 DIAGNOSIS — G8929 Other chronic pain: Secondary | ICD-10-CM | POA: Diagnosis not present

## 2017-04-15 DIAGNOSIS — Z7982 Long term (current) use of aspirin: Secondary | ICD-10-CM | POA: Diagnosis not present

## 2017-04-15 DIAGNOSIS — R7982 Elevated C-reactive protein (CRP): Secondary | ICD-10-CM | POA: Diagnosis not present

## 2017-04-15 DIAGNOSIS — R0602 Shortness of breath: Secondary | ICD-10-CM

## 2017-04-15 DIAGNOSIS — R0600 Dyspnea, unspecified: Secondary | ICD-10-CM | POA: Diagnosis not present

## 2017-04-15 DIAGNOSIS — Z91040 Latex allergy status: Secondary | ICD-10-CM | POA: Diagnosis not present

## 2017-04-15 DIAGNOSIS — G4733 Obstructive sleep apnea (adult) (pediatric): Secondary | ICD-10-CM

## 2017-04-15 DIAGNOSIS — Z79899 Other long term (current) drug therapy: Secondary | ICD-10-CM | POA: Diagnosis not present

## 2017-04-15 DIAGNOSIS — I10 Essential (primary) hypertension: Secondary | ICD-10-CM | POA: Diagnosis not present

## 2017-04-15 LAB — BASIC METABOLIC PANEL
Anion gap: 9 (ref 5–15)
BUN: 18 mg/dL (ref 6–20)
CHLORIDE: 100 mmol/L — AB (ref 101–111)
CO2: 29 mmol/L (ref 22–32)
Calcium: 9.9 mg/dL (ref 8.9–10.3)
Creatinine, Ser: 1.13 mg/dL — ABNORMAL HIGH (ref 0.44–1.00)
GFR calc Af Amer: >60 mL/min (ref >60)
GFR calc non Af Amer: 58 mL/min — ABNORMAL LOW (ref >60)
GLUCOSE: 112 mg/dL — AB (ref 65–99)
POTASSIUM: 4.5 mmol/L (ref 3.5–5.1)
Sodium: 138 mmol/L (ref 135–145)

## 2017-04-15 LAB — TROPONIN I
Troponin I: <0.03 ng/mL (ref <0.03)
Troponin I: <0.03 ng/mL (ref <0.03)

## 2017-04-15 LAB — CBC
HEMATOCRIT: 42.8 % (ref 35.0–47.0)
Hemoglobin: 14.5 g/dL (ref 12.0–16.0)
MCH: 32.3 pg (ref 26.0–34.0)
MCHC: 33.8 g/dL (ref 32.0–36.0)
MCV: 95.6 fL (ref 80.0–100.0)
Platelets: 233 10*3/uL (ref 150–440)
RBC: 4.48 MIL/uL (ref 3.80–5.20)
RDW: 14.2 % (ref 11.5–14.5)
WBC: 11.3 10*3/uL — ABNORMAL HIGH (ref 3.6–11.0)

## 2017-04-15 NOTE — Progress Notes (Signed)
BP 116/78   Pulse 98   Temp 98.3 F (36.8 C) (Oral)   Resp 14   Wt 219 lb 12.8 oz (99.7 kg)   LMP 03/23/2017 (Approximate)   SpO2 94%   BMI 38.94 kg/m    Subjective:    Patient ID: Dana Bradley, female    DOB: 1971/05/31, 46 y.o.   MRN: [id number]  HPI: Dana Bradley is a 46 y.o. female  Chief Complaint  Patient presents with  . Leg Swelling    bilateral  . Shortness of Breath    with walking,racing/pouding heart, palpitations andfatigue with headaches  . Referral    cardiologist  . Medication Refill    HPI  Patient is here with several concerns EKG reviewed on entry, no need for 911 Had EKG last year with Dr. Nehemiah Massed, [place] else in 2016; also had echo Sleeping propped up [facility], worse with walking Squeezing and hurting, heart beats slamming and beating hard; can't breathe, like there is not enough air Going on for a year, now daily, sometimes a few times a day; comes and goes Really hard and not like SVT; feels different from that Those episodes cause chest pain, last 5 mintues to an hour  Swelling in the legs; started one month ago Ankles will leave a dent Had procedure last week, glycopyrrolate given  Oxygen was 73% per her check out papers but reviewed procedure notes, and ox note 99 to 100% Tremble or quiver feeling No major changes to environment One child has left the nest; mother having some anxiety over that  Father had strokes; daughter has PFO (small)  Depression screen [facility] 2/9 04/15/2017 04/01/2017 01/06/2017 12/24/2016 11/27/2016  Decreased Interest 0 0 0 0 0  Down, Depressed, Hopeless 1 0 0 0 0  PHQ - 2 Score 1 0 0 0 0  Altered sleeping - - - - -  Tired, decreased energy - - - - -  Change in appetite - - - - -  Feeling bad or failure about yourself  - - - - -  Trouble concentrating - - - - -  Moving slowly or fidgety/restless - - - - -  Suicidal thoughts - - - - -  PHQ-9 Score - - - - -  Difficult doing work/chores - - - - -    Relevant past  medical, surgical, family and social history reviewed Past Medical History:  Diagnosis Date  . Bursitis   . Edema leg 05/02/2015  . Fibromyalgia   . GERD (gastroesophageal reflux disease)   . IBS (irritable bowel syndrome)   . Knee pain, bilateral 12/21/2008   Qualifier: Diagnosis of  By: Hassell Done FNP, Tori Milks    . Lumbar discitis   . Osteoarthritis   . Right hand pain 04/10/2015   [facility] has done nerve conduction studies and ruled out carpal tunnel.   . Sleep apnea   . SVT (supraventricular tachycardia) ([place])   . Vertigo   . Vitamin D deficiency 05/01/2016   Past Surgical History:  Procedure Laterality Date  . ABLATION     Uterine  . CARDIAC CATHETERIZATION    . COLONOSCOPY WITH PROPOFOL N/A 05/17/2015   Procedure: COLONOSCOPY WITH PROPOFOL;  Surgeon: Manya Silvas, MD;  [facility]COPY;  Service: Endoscopy;  Laterality: N/A;  . ESOPHAGOGASTRODUODENOSCOPY N/A 05/17/2015   Procedure: ESOPHAGOGASTRODUODENOSCOPY (EGD);  Surgeon: Manya Silvas, MD;  Location: [facility] ENDOSCOPY;  Service: Endoscopy;  Laterality: N/A;  . KNEE ARTHROSCOPY    . spg  6/18  . TUBAL LIGATION  10/01/99   Family History  Problem Relation Age of Onset  . Depression Mother   . Hypertension Mother   . Cancer Mother        Skin  . Hyperlipidemia Mother   . Alcohol abuse Father   . Depression Father   . Stroke Father   . Heart disease Father   . Hypertension Father   . Depression Sister   . Hyperlipidemia Sister   . Diabetes Sister   . Hypertension Sister   . Polycystic ovary syndrome Sister   . Bipolar disorder Sister   . Cancer Maternal Grandmother 77       Breast  . Thyroid disease Maternal Grandmother   . Arthritis Maternal Grandmother   . Hyperlipidemia Maternal Grandmother   . Depression Sister   . Hypertension Sister   . Alzheimer's disease Unknown   . Aneurysm Maternal Grandfather   . Hypertension Maternal Grandfather   . Heart disease Maternal Grandfather   .  Alzheimer's disease Paternal Grandmother   . Heart attack Paternal Grandfather   . Hypertension Paternal Grandfather   . COPD Paternal Grandfather   . Heart disease Paternal Grandfather   . Bladder Cancer Neg Hx   . Kidney cancer Neg Hx    Social History   Social History  . Marital status: Married    Spouse name: N/A  . Number of children: N/A  . Years of education: N/A   Occupational History  . Not on file.   Social History Main Topics  . Smoking status: Former Smoker    Packs/day: 4.00    Years: 3.00    Types: Cigarettes    Quit date: 12/10/1992  . Smokeless tobacco: Never Used  . Alcohol use No  . Drug use: No  . Sexual activity: Yes    Partners: Male   Other Topics Concern  . Not on file   Social History Narrative  . No narrative on file    Interim medical history since last visit reviewed. Allergies and medications reviewed  Review of Systems Per HPI unless specifically indicated above     Objective:    BP 116/78   Pulse 98   Temp 98.3 F (36.8 C) (Oral)   Resp 14   Wt 219 lb 12.8 oz (99.7 kg)   LMP 03/23/2017 (Approximate)   SpO2 94%   BMI 38.94 kg/m   Wt Readings from Last 3 Encounters:  04/15/17 219 lb 12.8 oz (99.7 kg)  04/07/17 215 lb (97.5 kg)  04/01/17 216 lb (98 kg)   weight was 210 pounds July 2017  Physical Exam  Constitutional: She appears well-developed and well-nourished.  obese  HENT:  Head: Atraumatic.  Mouth/Throat: Mucous membranes are normal.  Eyes: EOM are normal. No scleral icterus.  Neck: No JVD present.  Cardiovascular: Normal rate, regular rhythm and normal heart sounds.  Exam reveals no friction rub.   Pulmonary/Chest: Effort normal and breath sounds normal. She has no rales.  Abdominal: She exhibits no distension.  Musculoskeletal: She exhibits edema.  Skin: No pallor.  Psychiatric: She has a normal mood and affect. Her behavior is normal. Her mood appears not anxious. She does not exhibit a depressed mood.     Results for orders placed or performed in visit on 04/01/17  Comprehensive metabolic panel  Result Value Ref Range   Glucose 92 65 - 99 mg/dL   BUN 12 6 - 24 mg/dL   Creatinine, Ser 0.67 0.57 - 1.00  mg/dL   GFR calc non Af Amer 106 >59 mL/min/1.73   GFR calc Af Amer 123 >59 mL/min/1.73   BUN/Creatinine Ratio 18 9 - 23   Sodium 141 134 - 144 mmol/L   Potassium 3.9 3.5 - 5.2 mmol/L   Chloride 101 96 - 106 mmol/L   CO2 24 20 - 29 mmol/L   Calcium 9.7 8.7 - 10.2 mg/dL   Total Protein 6.6 6.0 - 8.5 g/dL   Albumin 4.1 3.5 - 5.5 g/dL   Globulin, Total 2.5 1.5 - 4.5 g/dL   Albumin/Globulin Ratio 1.6 1.2 - 2.2   Bilirubin Total 0.5 0.0 - 1.2 mg/dL   Alkaline Phosphatase 84 39 - 117 IU/L   AST 25 0 - 40 IU/L   ALT 37 (H) 0 - 32 IU/L  25-Hydroxyvitamin D Lcms D2+D3  Result Value Ref Range   25-Hydroxy, Vitamin D 20 (L) ng/mL   25-Hydroxy, Vitamin D-2 5.4 ng/mL   25-Hydroxy, Vitamin D-3 15 ng/mL  Sedimentation rate  Result Value Ref Range   Sed Rate 11 0 - 32 mm/hr  Vitamin B12  Result Value Ref Range   Vitamin B-12 606 232 - 1,245 pg/mL  Magnesium  Result Value Ref Range   Magnesium 2.1 1.6 - 2.3 mg/dL  C-reactive protein  Result Value Ref Range   CRP 10.5 (H) 0.0 - 4.9 mg/L      Assessment & Plan:   Problem List Items Addressed This Visit      Respiratory   Obstructive sleep apnea, adult    Wears CPAP every night        Other   Fatigue   Relevant Orders   TSH   Vitamin D insufficiency    Check with pain clinic doctor about how much he wants her on      History of PSVT (paroxysmal supraventricular tachycardia)    This feels different from previous episodes; will refer back to cardiologist; will likely need holter or event monitor ([place]) and echo, perhaps stress test, will defer to cardiologist      Relevant Orders   Ambulatory referral to Cardiology    Other Visit Diagnoses    Dyspnea, unspecified type    -  Primary   Relevant Orders   EKG  12-Lead (Completed)   D-Dimer, Quantitative   B Nat Peptide   CBC with Differential/Platelet   Ambulatory referral to Cardiology   Palpitations       Relevant Orders   EKG 12-Lead (Completed)   Ambulatory referral to Cardiology   CRP elevated       Relevant Orders   C-reactive protein   Ambulatory referral to Cardiology   Shortness of breath       Relevant Orders   Ambulatory referral to Cardiology   Chest pain, unspecified type       patient developed chest pain while in the office; declined 911/ambulance transport; husband will take her now to ER   Relevant Orders   Ambulatory referral to Cardiology      Follow up plan: No Follow-up on file.  An after-visit summary was printed and given to the patient at [place].  Please see the patient instructions which may contain other information and recommendations beyond what is mentioned above in the assessment and plan.  Meds ordered this encounter  Medications  . acetaminophen (TYLENOL) 325 MG tablet    Sig: Take 500 mg by mouth daily.  Marland Kitchen lamoTRIgine (LAMICTAL) 100 MG tablet    Sig: Take  100 mg by mouth 2 (two) times daily.  . Valerian 100 MG CAPS    Sig: Take 100 mg by mouth as needed.    Orders Placed This Encounter  Procedures  . TSH  . D-Dimer, Quantitative  . B Nat Peptide  . CBC with Differential/Platelet  . C-reactive protein  . Ambulatory referral to Cardiology  . EKG 12-Lead   Labs ordered, but then patient developed chest pain during visit and was sent straight to ER

## 2017-04-15 NOTE — Assessment & Plan Note (Signed)
This feels different from previous episodes; will refer back to cardiologist; will likely need holter or event monitor ([place]) and echo, perhaps stress test, will defer to cardiologist

## 2017-04-15 NOTE — ED Triage Notes (Signed)
Pt with left  Sided chest pain radiating down into left shoulder and left jaw, started earlier today.

## 2017-04-15 NOTE — ED Provider Notes (Signed)
[facility] Emergency Department Provider Note  ____________________________________________   First MD Initiated Contact with Patient 04/15/17 1932     (approximate)  I have reviewed the triage vital signs and the nursing notes.   HISTORY  Chief Complaint Chest Pain    HPI Dana Bradley is a 46 y.o. female who sent to the emergency department via her primary care physician for chest pain. She has had several days of intermittent episodes of sharp aching discomfort in her left upper chest radiating to her left shoulder. It is nonexertional. It is worse when ranging her left shoulder. It is not associated with shortness of breath. She has no history of cardiac disease. She last saw a cardiologist 2 years ago and had a normal stress test in 2016. She's never had a deep vein thrombus or pulmonary embolus in. She has not had recent surgery or immobilization although she did have an epidural injection done recently. She's had no hemoptysis.   Past Medical History:  Diagnosis Date  . Bursitis   . Edema leg 05/02/2015  . Fibromyalgia   . GERD (gastroesophageal reflux disease)   . IBS (irritable bowel syndrome)   . Knee pain, bilateral 12/21/2008   Qualifier: Diagnosis of  By: Hassell Done FNP, Tori Milks    . Lumbar discitis   . Osteoarthritis   . Right hand pain 04/10/2015   [facility] has done nerve conduction studies and ruled out carpal tunnel.   . Sleep apnea   . SVT (supraventricular tachycardia) ([place])   . Vertigo   . Vitamin D deficiency 05/01/2016    Patient Active Problem List   Diagnosis Date Noted  . Lumbar L1-2 disc protrusion (Right) 04/07/2017  . Acute postoperative pain 04/07/2017  . Muscle spasticity 04/01/2017  . 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 Lumbar spondylosis 01/06/2017  . Chronic left hip pain 12/24/2016  . Chronic sacroiliac joint pain (Left) 12/24/2016  . Lumbar facet joint syndrome (B) (L>R) 12/24/2016  . Left lumbar  radiculitis 12/24/2016  . Hypertriglyceridemia 11/27/2016  . Intractable headache 09/09/2016  . Medication monitoring encounter 08/29/2016  . Controlled substance agreement signed 08/28/2016  . Plantar fasciitis of left foot 08/28/2016  . Vitamin B12 deficiency 08/28/2016  . Hyperlipidemia 08/28/2016  . Nephrolithiasis 08/12/2016  . Chronic pain syndrome 08/07/2016  . Long term prescription opiate use 08/07/2016  . Opiate use 08/07/2016  . Long term prescription benzodiazepine use 08/07/2016  . Neurogenic pain 08/07/2016  . Chronic low back pain (Location of Primary Source of Pain) (Bilateral) (R>L) (midline) 08/07/2016  . Chronic upper back pain (Location of Secondary source of pain) (Bilateral) (L>R) 08/07/2016  . Chronic abdominal pain (Right lower quadrant) 08/07/2016  . Thoracic radiculitis (Right) (T11 dermatome) 08/07/2016  . Chronic occipital neuralgia (Location of Tertiary source of pain) (Bilateral) (L>R) 08/07/2016  . Chronic neck pain 08/07/2016  . Chronic cervical radicular pain (Bilateral) (L>R) 08/07/2016  . Chronic shoulder blade pain (Bilateral) (L>R) 08/07/2016  . Chronic upper extremity pain (Bilateral) (R>L) 08/07/2016  . Chronic knee pain (Bilateral) (R>L) 08/07/2016  . Chronic ankle pain (Bilateral) 08/07/2016  . Cervical spondylosis with myelopathy and radiculopathy 08/07/2016  . Panic disorder with agoraphobia 05/29/2016  . Depression, unspecified depression type 05/29/2016  . Atypical lymphocytosis 05/01/2016  . Vitamin D insufficiency 05/01/2016  . Chronic lower extremity cramps (Bilateral) 04/29/2016  . Obesity 04/29/2016  . GAD (generalized anxiety disorder) 04/29/2016  . Fatigue 04/29/2016  . Insomnia 07/12/2015  . Migraine without aura and with  status migrainosus, not intractable 07/12/2015  . Chronic superficial gastritis 06/02/2015  . Chronic pain of multiple joints 05/15/2015  . Bilateral leg edema 05/02/2015  . Paroxysmal supraventricular  tachycardia ([place]) 04/17/2015  . Bright red rectal bleeding 04/06/2015  . DDD (degenerative disc disease), lumbosacral 01/24/2014  . Degeneration of intervertebral disc of lumbosacral region 01/24/2014  . Cervico-occipital neuralgia 12/29/2013  . Fibromyalgia 12/29/2013  . Migraine headache 12/29/2013  . Menorrhagia 12/10/2012  . Depression, major, recurrent, in remission ([place]) 01/12/2009  . Hypertension, benign essential, goal below 140/90 06/23/2008  . History of PSVT (paroxysmal supraventricular tachycardia) 06/17/2008  . Obstructive sleep apnea, adult 06/17/2008  . GERD 06/13/2008    Past Surgical History:  Procedure Laterality Date  . ABLATION     Uterine  . CARDIAC CATHETERIZATION    . COLONOSCOPY WITH PROPOFOL N/A 05/17/2015   Procedure: COLONOSCOPY WITH PROPOFOL;  Surgeon: Manya Silvas, MD;  Location: [facility] ENDOSCOPY;  Service: Endoscopy;  Laterality: N/A;  . ESOPHAGOGASTRODUODENOSCOPY N/A 05/17/2015   Procedure: ESOPHAGOGASTRODUODENOSCOPY (EGD);  Surgeon: Manya Silvas, MD;  Location[facility] ENDOSCOPY;  Service: Endoscopy;  Laterality: N/A;  . KNEE ARTHROSCOPY    . spg     6/18  . TUBAL LIGATION  10/01/99    Prior to Admission medications   Medication Sig Start Date End Date Taking? Authorizing Provider  acetaminophen (TYLENOL) 325 MG tablet Take 500 mg by mouth daily.    [provider]  ALPRAZolam Duanne Moron) 1 MG tablet Take 1 tablet (1 mg total) by mouth at bedtime as needed for anxiety. 07/12/15   Bobetta Lime, MD  amitriptyline (ELAVIL) 10 MG tablet Take 10 mg by mouth as needed. 11/15/14   [provider]  atorvastatin (LIPITOR) 10 MG tablet Take 1 tablet (10 mg total) by mouth at bedtime. Every other day Patient not taking: Reported on 04/15/2017 11/27/16   Arnetha Courser, MD  baclofen (LIORESAL) 10 MG tablet Take 1 tablet (10 mg total) by mouth 3 (three) times daily. Patient taking differently: Take 10 mg by mouth as needed.  04/01/17 06/30/17   Milinda Pointer, MD  Calcium-Vitamin D-Vitamin K [id number] MG-UNT-MCG TABS Take by mouth daily.     [provider]  Cyanocobalamin (VITAMIN B-12 PO) Take 1,500 mcg by mouth daily.    [provider]  diclofenac sodium (VOLTAREN) 1 % GEL Apply 2 g topically 4 (four) times daily. 04/01/17 06/30/17  Milinda Pointer, MD  HYDROcodone-acetaminophen (NORCO) 7.5-325 MG tablet Take 1 tablet by mouth every 6 (six) hours as needed for moderate pain. 04/01/17 05/01/17  Milinda Pointer, MD  lamoTRIgine (LAMICTAL) 100 MG tablet Take 100 mg by mouth 2 (two) times daily. 04/15/17   [provider]  magnesium 30 MG tablet Take 30 mg by mouth daily.     [provider]  metoprolol (LOPRESSOR) 50 MG tablet Take 1 tablet (50 mg total) by mouth 2 (two) times daily. 04/29/16   Arnetha Courser, MD  omega-3 acid ethyl esters (LOVAZA) 1 g capsule Take 2 capsules (2 g total) by mouth 2 (two) times daily. 11/27/16   Arnetha Courser, MD  pregabalin (LYRICA) 150 MG capsule Take 1 capsule (150 mg total) by mouth every 8 (eight) hours. 04/01/17 06/30/17  Milinda Pointer, MD  Valerian 100 MG CAPS Take 100 mg by mouth as needed.    [provider]    Allergies Aspirin; Cymbalta [duloxetine hcl]; Demerol [meperidine]; Depakote [divalproex sodium]; Haloperidol; Iodinated diagnostic agents; Reglan [metoclopramide]; Tramadol hcl; Trazodone;  Compazine [prochlorperazine]; Meloxicam; Penicillins; Tomato; Shellfish-derived products; Bacitracin-neomycin-polymyxin; Cephalosporins; Ibuprofen; Latex; Neosporin [neomycin-bacitracin zn-polymyx]; Nsaids; and Sulfonamide derivatives  Family History  Problem Relation Age of Onset  . Depression Mother   . Hypertension Mother   . Cancer Mother        Skin  . Hyperlipidemia Mother   . Alcohol abuse Father   . Depression Father   . Stroke Father   . Heart disease Father   . Hypertension Father   . Depression Sister   . Hyperlipidemia Sister   .  Diabetes Sister   . Hypertension Sister   . Polycystic ovary syndrome Sister   . Bipolar disorder Sister   . Cancer Maternal Grandmother 25       Breast  . Thyroid disease Maternal Grandmother   . Arthritis Maternal Grandmother   . Hyperlipidemia Maternal Grandmother   . Depression Sister   . Hypertension Sister   . Alzheimer's disease Unknown   . Aneurysm Maternal Grandfather   . Hypertension Maternal Grandfather   . Heart disease Maternal Grandfather   . Alzheimer's disease Paternal Grandmother   . Heart attack Paternal Grandfather   . Hypertension Paternal Grandfather   . COPD Paternal Grandfather   . Heart disease Paternal Grandfather   . Bladder Cancer Neg Hx   . Kidney cancer Neg Hx     Social History Social History  Substance Use Topics  . Smoking status: Former Smoker    Packs/day: 4.00    Years: 3.00    Types: Cigarettes    Quit date: 12/10/1992  . Smokeless tobacco: Never Used  . Alcohol use No    Review of Systems Constitutional: No fever/chills Eyes: No visual changes. ENT: No sore throat. Cardiovascular: Positive chest pain. Respiratory: Denies shortness of breath. Gastrointestinal: No abdominal pain.  No nausea, no vomiting.  No diarrhea.  No constipation. Genitourinary: Negative for dysuria. Musculoskeletal: Negative for back pain. Skin: Negative for rash. Neurological: Negative for headaches, focal weakness or numbness.   ____________________________________________   PHYSICAL EXAM:  VITAL SIGNS: ED Triage Vitals  Enc Vitals Group     BP 04/15/17 1652 130/75     Pulse Rate 04/15/17 1652 80     Resp 04/15/17 1652 20     Temp 04/15/17 1652 98.9 F (37.2 C)     Temp Source 04/15/17 1652 Oral     SpO2 04/15/17 1652 96 %     Weight --      Height --      Head Circumference --      Peak Flow --      Pain Score 04/15/17 1651 5     Pain Loc --      Pain Edu? --      Excl. in [place]? --     Constitutional: Alert and oriented 4 joking  laughing very well-appearing nontoxic no diaphoresis speaks full clear sentences Eyes: PERRL EOMI. Head: Atraumatic. Nose: No congestion/rhinnorhea. Mouth/Throat: No trismus Neck: No stridor.   Cardiovascular: Normal rate, regular rhythm. Grossly normal heart sounds.  Good peripheral circulation. Respiratory: Normal respiratory effort.  No retractions. Lungs CTAB and moving good air Gastrointestinal: Obese abdomen soft nontender Musculoskeletal: No lower extremity edema   Neurologic:  Normal speech and language. No gross focal neurologic deficits are appreciated. Skin:  Skin is warm, dry and intact. No rash noted. Psychiatric: Mood and affect are normal. Speech and behavior are normal.    ____________________________________________   DIFFERENTIAL includes but not limited to  Acute coronary syndrome,  pulmonary embolus, aortic dissection, Boerhaave syndrome, pneumothorax ____________________________________________   LABS (all labs ordered are listed, but only abnormal results are displayed)  Labs Reviewed  BASIC METABOLIC PANEL - Abnormal; Notable for the following:       Result Value   Chloride 100 (*)    Glucose, Bld 112 (*)    Creatinine, Ser 1.13 (*)    GFR calc non Af Amer 58 (*)    All other components within normal limits  CBC - Abnormal; Notable for the following:    WBC 11.3 (*)    All other components within normal limits  TROPONIN I  TROPONIN I    No signs of acute ischemia 2 __________________________________________  EKG  ED ECG REPORT I, Darel Hong, the attending physician, personally viewed and interpreted this ECG.  Date: 04/15/2017 Rate: 80 Rhythm: normal sinus rhythm QRS Axis: normal Intervals: normal ST/T Wave abnormalities: normal Narrative Interpretation: unremarkable  ____________________________________________  RADIOLOGY  Chest x-ray with no acute  disease ____________________________________________   PROCEDURES  Procedure(s) performed: no  Procedures  Critical Care performed: no  Observation: no ____________________________________________   INITIAL IMPRESSION / ASSESSMENT AND PLAN / ED COURSE  Pertinent labs & imaging results that were available during my care of the patient were reviewed by me and considered in my medical decision making (see chart for details).  The patient is very well-appearing with an unremarkable chest x-ray EKG and first troponin. She has no known cardiac history and had a normal stress test most recently in 2016. To me her pain is quite atypical and I'm comfortable discharging her home. She requests a second troponin to be sent for her own peace of mind which I think is reasonable however she does not want to wait for the results and would like me to call her back. Her heart score is 2 and she is PERC negative.     ----------------------------------------- 8:33 PM on 04/15/2017 -----------------------------------------  I called the patient and notify her of the normal troponin.Her pain remains resolved and she is stable for follow-up as scheduled. ____________________________________________   FINAL CLINICAL IMPRESSION(S) / ED DIAGNOSES  Final diagnoses:  Atypical chest pain      NEW MEDICATIONS STARTED DURING THIS VISIT:  Discharge Medication List as of 04/15/2017  7:47 PM       Note:  This document was prepared using Dragon voice recognition software and may include unintentional dictation errors.     Darel Hong, MD 04/16/17 [phone]

## 2017-04-15 NOTE — Assessment & Plan Note (Signed)
Wears CPAP every night

## 2017-04-15 NOTE — Assessment & Plan Note (Signed)
Check with pain clinic doctor about how much he wants her on

## 2017-04-15 NOTE — ED Notes (Signed)
Pt signed out but signature pad not working

## 2017-04-15 NOTE — Discharge Instructions (Signed)
Fortunately today you blood work is reassuring as was your EKG and chest x-ray. Please keep your follow-up with cardiology as scheduled and return to the emergency department for any concerns.  It was a pleasure to take care of you today, and thank you for coming to our emergency department.  If you have any questions or concerns before leaving please ask the nurse to grab me and I'm more than happy to go through your aftercare instructions again.  If you were prescribed any opioid pain medication today such as Norco, Vicodin, Percocet, morphine, hydrocodone, or oxycodone please make sure you do not drive when you are taking this medication as it can alter your ability to drive safely.  If you have any concerns once you are home that you are not improving or are in fact getting worse before you can make it to your follow-up appointment, please do not hesitate to call 911 and come back for further evaluation.  Darel Hong MD  Results for orders placed or performed during the hospital encounter of 01/74/94  Basic metabolic panel  Result Value Ref Range   Sodium 138 135 - 145 mmol/L   Potassium 4.5 3.5 - 5.1 mmol/L   Chloride 100 (L) 101 - 111 mmol/L   CO2 29 22 - 32 mmol/L   Glucose, Bld 112 (H) 65 - 99 mg/dL   BUN 18 6 - 20 mg/dL   Creatinine, Ser 1.13 (H) 0.44 - 1.00 mg/dL   Calcium 9.9 8.9 - 10.3 mg/dL   GFR calc non Af Amer 58 (L) >60 mL/min   GFR calc Af Amer >60 >60 mL/min   Anion gap 9 5 - 15  CBC  Result Value Ref Range   WBC 11.3 (H) 3.6 - 11.0 K/uL   RBC 4.48 3.80 - 5.20 MIL/uL   Hemoglobin 14.5 12.0 - 16.0 g/dL   HCT 42.8 35.0 - 47.0 %   MCV 95.6 80.0 - 100.0 fL   MCH 32.3 26.0 - 34.0 pg   MCHC 33.8 32.0 - 36.0 g/dL   RDW 14.2 11.5 - 14.5 %   Platelets 233 150 - 440 K/uL  Troponin I  Result Value Ref Range   Troponin I <0.03 <0.03 ng/mL   Dg Chest 2 View  Result Date: 04/15/2017 CLINICAL DATA:  46 y/o  F; chest pain. EXAM: CHEST  2 VIEW COMPARISON:  05/06/2007  chest radiograph. FINDINGS: Stable heart size and mediastinal contours are within normal limits. Both lungs are clear. The visualized skeletal structures are unremarkable. IMPRESSION: No active cardiopulmonary disease. Electronically Signed   By: Kristine Garbe M.D.   On: 04/15/2017 18:00   Dg C-arm 1-60 Min-no Report  Result Date: 04/07/2017 Fluoroscopy was utilized by the requesting physician.  No radiographic interpretation.

## 2017-04-15 NOTE — Patient Instructions (Addendum)
Please contact your pain doctor about your vitamin D and your C-reactive protein I will refer you to Dr. Nehemiah Massed Go now to the ER

## 2017-04-24 ENCOUNTER — Ambulatory Visit: Payer: Medicaid Other | Admitting: Pain Medicine

## 2017-04-29 ENCOUNTER — Other Ambulatory Visit: Payer: Self-pay

## 2017-04-29 DIAGNOSIS — E781 Pure hyperglyceridemia: Secondary | ICD-10-CM

## 2017-05-02 ENCOUNTER — Emergency Department: Payer: Medicaid Other

## 2017-05-02 ENCOUNTER — Encounter: Payer: Self-pay | Admitting: *Deleted

## 2017-05-02 ENCOUNTER — Telehealth: Payer: Self-pay | Admitting: Family Medicine

## 2017-05-02 ENCOUNTER — Other Ambulatory Visit: Payer: Self-pay | Admitting: Family Medicine

## 2017-05-02 ENCOUNTER — Emergency Department
Admission: EM | Admit: 2017-05-02 | Discharge: 2017-05-03 | Disposition: A | Discharge status: Home / Self Care | Payer: Medicaid Other | Attending: Emergency Medicine | Admitting: Emergency Medicine

## 2017-05-02 DIAGNOSIS — Z79899 Other long term (current) drug therapy: Secondary | ICD-10-CM | POA: Insufficient documentation

## 2017-05-02 DIAGNOSIS — R0602 Shortness of breath: Secondary | ICD-10-CM | POA: Diagnosis present

## 2017-05-02 DIAGNOSIS — Z87891 Personal history of nicotine dependence: Secondary | ICD-10-CM | POA: Insufficient documentation

## 2017-05-02 DIAGNOSIS — I471 Supraventricular tachycardia: Secondary | ICD-10-CM | POA: Diagnosis not present

## 2017-05-02 DIAGNOSIS — Z91040 Latex allergy status: Secondary | ICD-10-CM | POA: Diagnosis not present

## 2017-05-02 DIAGNOSIS — R079 Chest pain, unspecified: Secondary | ICD-10-CM | POA: Insufficient documentation

## 2017-05-02 DIAGNOSIS — R7989 Other specified abnormal findings of blood chemistry: Secondary | ICD-10-CM | POA: Insufficient documentation

## 2017-05-02 LAB — CBC WITH DIFFERENTIAL/PLATELET
BASOS PCT: 0 %
Basophils Absolute: 0 cells/uL (ref 0–200)
Eosinophils Absolute: 124 cells/uL (ref 15–500)
Eosinophils Relative: 2 %
HEMATOCRIT: 41.3 % (ref 35.0–45.0)
HEMOGLOBIN: 14.2 g/dL (ref 11.7–15.5)
LYMPHS ABS: 1674 {cells}/uL (ref 850–3900)
Lymphocytes Relative: 27 %
MCH: 33.2 pg — ABNORMAL HIGH (ref 27.0–33.0)
MCHC: 34.4 g/dL (ref 32.0–36.0)
MCV: 96.5 fL (ref 80.0–100.0)
MONO ABS: 372 {cells}/uL (ref 200–950)
MPV: 9.3 fL (ref 7.5–12.5)
Monocytes Relative: 6 %
Neutro Abs: 4030 cells/uL (ref 1500–7800)
Neutrophils Relative %: 65 %
Platelets: 206 10*3/uL (ref 140–400)
RBC: 4.28 MIL/uL (ref 3.80–5.10)
RDW: 13.4 % (ref 11.0–15.0)
WBC: 6.2 10*3/uL (ref 3.8–10.8)

## 2017-05-02 LAB — BASIC METABOLIC PANEL
Anion gap: 9 (ref 5–15)
BUN: 13 mg/dL (ref 6–20)
CO2: 27 mmol/L (ref 22–32)
CREATININE: 0.69 mg/dL (ref 0.44–1.00)
Calcium: 9.6 mg/dL (ref 8.9–10.3)
Chloride: 105 mmol/L (ref 101–111)
GFR calc Af Amer: >60 mL/min (ref >60)
Glucose, Bld: 106 mg/dL — ABNORMAL HIGH (ref 65–99)
Potassium: 3.6 mmol/L (ref 3.5–5.1)
SODIUM: 141 mmol/L (ref 135–145)

## 2017-05-02 LAB — CBC
HCT: 42 % (ref 35.0–47.0)
Hemoglobin: 14.4 g/dL (ref 12.0–16.0)
MCH: 33.2 pg (ref 26.0–34.0)
MCHC: 34.2 g/dL (ref 32.0–36.0)
MCV: 97 fL (ref 80.0–100.0)
Platelets: 214 10*3/uL (ref 150–440)
RBC: 4.33 MIL/uL (ref 3.80–5.20)
RDW: 13.4 % (ref 11.5–14.5)
WBC: 7.3 10*3/uL (ref 3.6–11.0)

## 2017-05-02 LAB — TROPONIN I: Troponin I: <0.03 ng/mL (ref <0.03)

## 2017-05-02 LAB — BRAIN NATRIURETIC PEPTIDE: Brain Natriuretic Peptide: 14.2 pg/mL (ref <100)

## 2017-05-02 LAB — TSH: TSH: 2.63 m[IU]/L

## 2017-05-02 LAB — D-DIMER, QUANTITATIVE: D-Dimer, Quant: 0.57 mcg/mL FEU — ABNORMAL HIGH (ref <0.50)

## 2017-05-02 MED ORDER — TECHNETIUM TO 99M ALBUMIN AGGREGATED
4.0900 | Freq: Once | INTRAVENOUS | Status: AC | PRN
  Administered 2017-05-02: 4.09 via INTRAVENOUS

## 2017-05-02 MED ORDER — METOPROLOL TARTRATE 50 MG PO TABS
50.0000 mg | ORAL_TABLET | Freq: Once | ORAL | Status: AC
Start: 2017-05-02 — End: 2017-05-02
  Administered 2017-05-02: 50 mg via ORAL

## 2017-05-02 MED ORDER — TECHNETIUM TC 99M DIETHYLENETRIAME-PENTAACETIC ACID
37.4800 | Freq: Once | INTRAVENOUS | Status: AC | PRN
  Administered 2017-05-02: 37.48 via INTRAVENOUS

## 2017-05-02 MED ORDER — METOPROLOL TARTRATE 50 MG PO TABS
ORAL_TABLET | ORAL | Status: AC
  Administered 2017-05-02: 50 mg via ORAL
  Filled 2017-05-02: qty 1

## 2017-05-02 NOTE — ED Notes (Signed)
[place]

## 2017-05-02 NOTE — ED Notes (Signed)
Pt. Taken for V/Q scan.

## 2017-05-02 NOTE — ED Triage Notes (Signed)
Pt arrives with complaints of sob, pt is wearing a holter monitor, states chest pain and sob for weeks, states she had a positive d-dimer at her PCP this AM, awake and alert in no acute distress

## 2017-05-02 NOTE — Telephone Encounter (Signed)
Positive D-dimer; patient had been having episodes of chest pain and SHOB; none at the moment; referred to straightaway to the ER; most likely a false positive, but can't make that call with a CT scan; she'll go to [facility]

## 2017-05-02 NOTE — Assessment & Plan Note (Signed)
Go to ER now

## 2017-05-02 NOTE — ED Provider Notes (Signed)
[facility] Emergency Department Provider Note  Time seen: 8:48 PM  I have reviewed the triage vital signs and the nursing notes.   HISTORY  Chief Complaint Shortness of Breath    HPI Dana Bradley is a 46 y.o. female with a past medical history of fibromyalgia, gastric reflux, SVT, presents to the emergency department for intermittent chest pain and shortness breath. According to the patient over the past one month she has been having intermittent central left-sided chest discomfort which she describes as intermittent sharp pains, moderate in severity but brief. Also states intermittent shortness of breath especially with exertion. Patient has been seen by her primary care doctor, they ordered labs today including a d-dimer which resulted as positive and they were told to come to the emergency department for further evaluation. Currently the patient denies any chest pain or shortness breath at this time. Denies any pleuritic chest pain. Patient does have mild bilateral lower extremity edema which she states has been occurring over the past 6 weeks but denies any unilateral swelling or pain.  Past Medical History:  Diagnosis Date  . Bursitis   . Edema leg 05/02/2015  . Fibromyalgia   . GERD (gastroesophageal reflux disease)   . IBS (irritable bowel syndrome)   . Knee pain, bilateral 12/21/2008   Qualifier: Diagnosis of  By: Hassell Done FNP, Tori Milks    . Lumbar discitis   . Osteoarthritis   . Right hand pain 04/10/2015   [facility] has done nerve conduction studies and ruled out carpal tunnel.   . Sleep apnea   . SVT (supraventricular tachycardia) ([place])   . Vertigo   . Vitamin D deficiency 05/01/2016    Patient Active Problem List   Diagnosis Date Noted  . Positive D-dimer 05/02/2017  . Lumbar L1-2 disc protrusion (Right) 04/07/2017  . Acute postoperative pain 04/07/2017  . Muscle spasticity 04/01/2017  . 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 Lumbar  spondylosis 01/06/2017  . Chronic left hip pain 12/24/2016  . Chronic sacroiliac joint pain (Left) 12/24/2016  . Lumbar facet joint syndrome (B) (L>R) 12/24/2016  . Left lumbar radiculitis 12/24/2016  . Hypertriglyceridemia 11/27/2016  . Intractable headache 09/09/2016  . Medication monitoring encounter 08/29/2016  . Controlled substance agreement signed 08/28/2016  . Plantar fasciitis of left foot 08/28/2016  . Vitamin B12 deficiency 08/28/2016  . Hyperlipidemia 08/28/2016  . Nephrolithiasis 08/12/2016  . Chronic pain syndrome 08/07/2016  . Long term prescription opiate use 08/07/2016  . Opiate use 08/07/2016  . Long term prescription benzodiazepine use 08/07/2016  . Neurogenic pain 08/07/2016  . Chronic low back pain (Location of Primary Source of Pain) (Bilateral) (R>L) (midline) 08/07/2016  . Chronic upper back pain (Location of Secondary source of pain) (Bilateral) (L>R) 08/07/2016  . Chronic abdominal pain (Right lower quadrant) 08/07/2016  . Thoracic radiculitis (Right) (T11 dermatome) 08/07/2016  . Chronic occipital neuralgia (Location of Tertiary source of pain) (Bilateral) (L>R) 08/07/2016  . Chronic neck pain 08/07/2016  . Chronic cervical radicular pain (Bilateral) (L>R) 08/07/2016  . Chronic shoulder blade pain (Bilateral) (L>R) 08/07/2016  . Chronic upper extremity pain (Bilateral) (R>L) 08/07/2016  . Chronic knee pain (Bilateral) (R>L) 08/07/2016  . Chronic ankle pain (Bilateral) 08/07/2016  . Cervical spondylosis with myelopathy and radiculopathy 08/07/2016  . Panic disorder with agoraphobia 05/29/2016  . Depression, unspecified depression type 05/29/2016  . Atypical lymphocytosis 05/01/2016  . Vitamin D insufficiency 05/01/2016  . Chronic lower extremity cramps (Bilateral) 04/29/2016  . Obesity 04/29/2016  .  GAD (generalized anxiety disorder) 04/29/2016  . Fatigue 04/29/2016  . Insomnia 07/12/2015  . Migraine without aura and with status migrainosus, not  intractable 07/12/2015  . Chronic superficial gastritis 06/02/2015  . Chronic pain of multiple joints 05/15/2015  . Bilateral leg edema 05/02/2015  . Paroxysmal supraventricular tachycardia ([place]) 04/17/2015  . Bright red rectal bleeding 04/06/2015  . DDD (degenerative disc disease), lumbosacral 01/24/2014  . Degeneration of intervertebral disc of lumbosacral region 01/24/2014  . Cervico-occipital neuralgia 12/29/2013  . Fibromyalgia 12/29/2013  . Migraine headache 12/29/2013  . Menorrhagia 12/10/2012  . Depression, major, recurrent, in remission ([place]) 01/12/2009  . Hypertension, benign essential, goal below 140/90 06/23/2008  . History of PSVT (paroxysmal supraventricular tachycardia) 06/17/2008  . Obstructive sleep apnea, adult 06/17/2008  . GERD 06/13/2008    Past Surgical History:  Procedure Laterality Date  . ABLATION     Uterine  . CARDIAC CATHETERIZATION    . COLONOSCOPY WITH PROPOFOL N/A 05/17/2015   Procedure: COLONOSCOPY WITH PROPOFOL;  Surgeon: Manya Silvas, MD;  Location: [facility] ENDOSCOPY;  Service: Endoscopy;  Laterality: N/A;  . ESOPHAGOGASTRODUODENOSCOPY N/A 05/17/2015   Procedure: ESOPHAGOGASTRODUODENOSCOPY (EGD);  Surgeon: Manya Silvas, MD;  Location: [facility] ENDOSCOPY;  Service: Endoscopy;  Laterality: N/A;  . KNEE ARTHROSCOPY    . spg     6/18  . TUBAL LIGATION  10/01/99    Prior to Admission medications   Medication Sig Start Date End Date Taking? Authorizing Provider  acetaminophen (TYLENOL) 325 MG tablet Take 650 mg by mouth daily.    Yes [provider]  ALPRAZolam Duanne Moron) 1 MG tablet Take 1 tablet (1 mg total) by mouth at bedtime as needed for anxiety. 07/12/15  Yes Bobetta Lime, MD  baclofen (LIORESAL) 10 MG tablet Take 1 tablet (10 mg total) by mouth 3 (three) times daily. Patient taking differently: Take 10 mg by mouth as needed.  04/01/17 06/30/17 Yes Milinda Pointer, MD  Calcium-Vitamin D-Vitamin K 315-176-16 MG-UNT-MCG TABS Take 1  tablet by mouth daily.    Yes [provider]  Cyanocobalamin (VITAMIN B-12 PO) Take 1,500 mcg by mouth daily.   Yes [provider]  diclofenac sodium (VOLTAREN) 1 % GEL Apply 2 g topically 4 (four) times daily. 04/01/17 06/30/17 Yes Milinda Pointer, MD  HYDROcodone-acetaminophen (NORCO) 7.5-325 MG tablet Take 1 tablet by mouth every 6 (six) hours as needed for moderate pain. 04/01/17 05/02/17 Yes Milinda Pointer, MD  lamoTRIgine (LAMICTAL) 100 MG tablet Take 100 mg by mouth 2 (two) times daily. 04/15/17  Yes [provider]  magnesium 30 MG tablet Take 30 mg by mouth daily.    Yes [provider]  metoprolol (LOPRESSOR) 50 MG tablet Take 1 tablet (50 mg total) by mouth 2 (two) times daily. 04/29/16  Yes Lada, Satira Anis, MD  omega-3 acid ethyl esters (LOVAZA) 1 g capsule Take 2 capsules (2 g total) by mouth 2 (two) times daily. 11/27/16  Yes Lada, Satira Anis, MD  pregabalin (LYRICA) 150 MG capsule Take 1 capsule (150 mg total) by mouth every 8 (eight) hours. 04/01/17 06/30/17 Yes Milinda Pointer, MD  Valerian 100 MG CAPS Take 100 mg by mouth as needed.   Yes [provider]  atorvastatin (LIPITOR) 10 MG tablet Take 1 tablet (10 mg total) by mouth at bedtime. Every other day Patient not taking: Reported on 04/15/2017 11/27/16   Arnetha Courser, MD    Allergies  Allergen Reactions  . Aspirin Swelling  . Cymbalta [Duloxetine Hcl] Other (See  Comments)    Suicidal ideations and has homicidal thoughts per patient  . Demerol [Meperidine] Nausea And Vomiting    Patient projectile vomits and usually result in ER  . Depakote [Divalproex Sodium] Shortness Of Breath    W/ n/v  . Haloperidol Shortness Of Breath    W/ n/v  . Iodinated Diagnostic Agents Other (See Comments)  . Reglan [Metoclopramide] Shortness Of Breath    Can't breath, wheezes  . Tramadol Hcl Palpitations    Severely and adversely affects her SVT giving her tachycardias of 150-160 bpm.  .  Trazodone Shortness Of Breath  . Compazine [Prochlorperazine] Other (See Comments)    Panic attack  . Meloxicam Other (See Comments)    mouth sores, tingling, blisters in mouth  . Penicillins Rash  . Tomato Hives    Tongue will blister  . Shellfish-Derived Products Other (See Comments)  . Bacitracin-Neomycin-Polymyxin Rash  . Cephalosporins Rash    rash  . Ibuprofen Other (See Comments) and Rash    Blisters in mouth. Blisters in mouth.  . Latex Itching  . Neosporin [Neomycin-Bacitracin Zn-Polymyx] Rash  . Nsaids Other (See Comments)    Blisters in mouth; can take 1 ibuprofen 2x a month  . Sulfonamide Derivatives Rash    Family History  Problem Relation Age of Onset  . Depression Mother   . Hypertension Mother   . Cancer Mother        Skin  . Hyperlipidemia Mother   . Alcohol abuse Father   . Depression Father   . Stroke Father   . Heart disease Father   . Hypertension Father   . Depression Sister   . Hyperlipidemia Sister   . Diabetes Sister   . Hypertension Sister   . Polycystic ovary syndrome Sister   . Bipolar disorder Sister   . Cancer Maternal Grandmother 56       Breast  . Thyroid disease Maternal Grandmother   . Arthritis Maternal Grandmother   . Hyperlipidemia Maternal Grandmother   . Depression Sister   . Hypertension Sister   . Alzheimer's disease Unknown   . Aneurysm Maternal Grandfather   . Hypertension Maternal Grandfather   . Heart disease Maternal Grandfather   . Alzheimer's disease Paternal Grandmother   . Heart attack Paternal Grandfather   . Hypertension Paternal Grandfather   . COPD Paternal Grandfather   . Heart disease Paternal Grandfather   . Bladder Cancer Neg Hx   . Kidney cancer Neg Hx     Social History Social History  Substance Use Topics  . Smoking status: Former Smoker    Packs/day: 4.00    Years: 3.00    Types: Cigarettes    Quit date: 12/10/1992  . Smokeless tobacco: Never Used  . Alcohol use No    Review of  Systems Constitutional: Negative for fever. ENT: Negative for congestion Cardiovascular: Intermittent central to left-sided sharp chest pains. Denies any currently. Respiratory: Intermittent shortness breath, worse with exertion, denies any currently. Gastrointestinal: Negative for abdominal pain, vomiting  Musculoskeletal: Mild lower extremity edema bilaterally. Neurological: Negative for headache All other ROS negative  ____________________________________________   PHYSICAL EXAM:  VITAL SIGNS: ED Triage Vitals  Enc Vitals Group     BP 05/02/17 1741 126/72     Pulse Rate 05/02/17 1741 78     Resp 05/02/17 1741 18     Temp 05/02/17 1741 98.9 F (37.2 C)     Temp Source 05/02/17 1741 Oral     SpO2 05/02/17 1741 100 %  Weight 05/02/17 1741 219 lb (99.3 kg)     Height 05/02/17 1741 5\' 3"  (1.6 m)     Head Circumference --      Peak Flow --      Pain Score 05/02/17 1740 2     Pain Loc --      Pain Edu? --      Excl. in [place]? --     Constitutional: Alert and oriented. Well appearing and in no distress. Eyes: Normal exam ENT   Head: Normocephalic and atraumatic   Mouth/Throat: Mucous membranes are moist. Cardiovascular: Normal rate, regular rhythm. No murmur Respiratory: Normal respiratory effort without tachypnea nor retractions. Breath sounds are clear. Chest is nontender. Gastrointestinal: Soft and nontender. No distention.   Musculoskeletal: Nontender with normal range of motion in all extremities. Mild lower show edema, equal bilaterally. No calf tenderness. Neurologic:  Normal speech and language. No gross focal neurologic deficits  Skin:  Skin is warm, dry and intact.  Psychiatric: Mood and affect are normal.   ____________________________________________    EKG  EKG reviewed and interpreted by myself shows sinus rhythm at 80 bpm, narrow QRS, normal axis, normal intervals, nonspecific ST changes.  ____________________________________________     RADIOLOGY  Chest x-ray negative  ____________________________________________   INITIAL IMPRESSION / ASSESSMENT AND PLAN / ED COURSE  Pertinent labs & imaging results that were available during my care of the patient were reviewed by me and considered in my medical decision making (see chart for details).  Patient presented to the emergency department for intermittent left-sided chest discomfort, short of breath over the past one month. Patient was called by her doctor today for a positive d-dimer and told to go to the ER. He'll the patient appears well denies any chest pain or shortness breath. She has a negative workup including labs and cardiac enzymes. Negative chest x-ray. Reassuring EKG. However given the patient's outpatient d-dimer which was positive for believe the patient does need imaging to rule out pulmonary embolus. Unfortunately the patient has a anaphylactic IV dye allergy, so she will require a ventilation/perfusion scan. I discussed the patient with nuclear medicine they're attempting to perform the scan was set it'll likely be 11 PM or later. I discussed with the patient who is agreeable to wait and have the test performed. Currently the patient appears well in no distress and no complaints.  Patient currently in VQ scan. Patient care signed out to Dr. Mable Paris  ____________________________________________   FINAL CLINICAL IMPRESSION(S) / ED DIAGNOSES  Chest pain Short of breath    Harvest Dark, MD 05/02/17 2322

## 2017-05-02 NOTE — ED Notes (Signed)
Pt. Husband left phone # [phone]

## 2017-05-03 LAB — LIPID PANEL
Cholesterol: 264 mg/dL — ABNORMAL HIGH (ref <200)
HDL: 47 mg/dL — AB (ref >50)
LDL CALC: 164 mg/dL — AB (ref <100)
Total CHOL/HDL Ratio: 5.6 Ratio — ABNORMAL HIGH (ref <5.0)
Triglycerides: 263 mg/dL — ABNORMAL HIGH (ref <150)
VLDL: 53 mg/dL — ABNORMAL HIGH (ref <30)

## 2017-05-03 NOTE — Discharge Instructions (Signed)
Fortunately today you're VQ scan was normal and he does not suggest that you have a pulmonary embolism. Please follow-up with your primary care physician on Monday for reevaluation and return to the emergency department for any concerns.  It was a pleasure to take care of you today, and thank you for coming to our emergency department.  If you have any questions or concerns before leaving please ask the nurse to grab me and I'm more than happy to go through your aftercare instructions again.  If you were prescribed any opioid pain medication today such as Norco, Vicodin, Percocet, morphine, hydrocodone, or oxycodone please make sure you do not drive when you are taking this medication as it can alter your ability to drive safely.  If you have any concerns once you are home that you are not improving or are in fact getting worse before you can make it to your follow-up appointment, please do not hesitate to call 911 and come back for further evaluation.  Darel Hong, MD  Results for orders placed or performed during the hospital encounter of 95/63/87  Basic metabolic panel  Result Value Ref Range   Sodium 141 135 - 145 mmol/L   Potassium 3.6 3.5 - 5.1 mmol/L   Chloride 105 101 - 111 mmol/L   CO2 27 22 - 32 mmol/L   Glucose, Bld 106 (H) 65 - 99 mg/dL   BUN 13 6 - 20 mg/dL   Creatinine, Ser 0.69 0.44 - 1.00 mg/dL   Calcium 9.6 8.9 - 10.3 mg/dL   GFR calc non Af Amer >60 >60 mL/min   GFR calc Af Amer >60 >60 mL/min   Anion gap 9 5 - 15  CBC  Result Value Ref Range   WBC 7.3 3.6 - 11.0 K/uL   RBC 4.33 3.80 - 5.20 MIL/uL   Hemoglobin 14.4 12.0 - 16.0 g/dL   HCT 42.0 35.0 - 47.0 %   MCV 97.0 80.0 - 100.0 fL   MCH 33.2 26.0 - 34.0 pg   MCHC 34.2 32.0 - 36.0 g/dL   RDW 13.4 11.5 - 14.5 %   Platelets 214 150 - 440 K/uL  Troponin I  Result Value Ref Range   Troponin I <0.03 <0.03 ng/mL   Dg Chest 2 View  Result Date: 05/02/2017 CLINICAL DATA:  46 y/o  F; shortness of breath. EXAM:  CHEST  2 VIEW COMPARISON:  04/15/2017 chest radiograph FINDINGS: Stable heart size and mediastinal contours are within normal limits. Both lungs are clear. The visualized skeletal structures are unremarkable. IMPRESSION: No active cardiopulmonary disease. Electronically Signed   By: Kristine Garbe M.D.   On: 05/02/2017 18:20   Dg Chest 2 View  Result Date: 04/15/2017 CLINICAL DATA:  46 y/o  F; chest pain. EXAM: CHEST  2 VIEW COMPARISON:  05/06/2007 chest radiograph. FINDINGS: Stable heart size and mediastinal contours are within normal limits. Both lungs are clear. The visualized skeletal structures are unremarkable. IMPRESSION: No active cardiopulmonary disease. Electronically Signed   By: Kristine Garbe M.D.   On: 04/15/2017 18:00   Nm Pulmonary Vent And Perf (v/q Scan)  Result Date: 05/03/2017 CLINICAL DATA:  46 year old female with positive D-dimer. EXAM: NUCLEAR MEDICINE VENTILATION - PERFUSION LUNG SCAN TECHNIQUE: Ventilation images were obtained in multiple projections using inhaled aerosol Tc-74m DTPA. Perfusion images were obtained in multiple projections after intravenous injection of Tc-22m MAA. RADIOPHARMACEUTICALS:  37.48 mCi Technetium-39m DTPA aerosol inhalation and 4.09 mCi Technetium-73m MAA IV COMPARISON:  Chest radiograph dated 05/02/2017  FINDINGS: Ventilation: No focal ventilation defect. Perfusion: No wedge shaped peripheral perfusion defects to suggest acute pulmonary embolism. IMPRESSION: Normal study. Electronically Signed   By: Anner Crete M.D.   On: 05/03/2017 00:14   Dg C-arm 1-60 Min-no Report  Result Date: 04/07/2017 Fluoroscopy was utilized by the requesting physician.  No radiographic interpretation.

## 2017-05-03 NOTE — ED Provider Notes (Signed)
VQ scan is normal. The patient feels well and would like to go home. Strict return precautions given. She is discharged home in improved condition.   Darel Hong, MD 05/03/17 Laureen Abrahams

## 2017-05-03 NOTE — ED Notes (Signed)
Pt. Going home with husband. 

## 2017-05-05 LAB — C-REACTIVE PROTEIN: CRP: 4.5 mg/L (ref <8.0)

## 2017-05-06 ENCOUNTER — Ambulatory Visit: Payer: Medicaid Other | Admitting: Family Medicine

## 2017-05-07 ENCOUNTER — Other Ambulatory Visit: Payer: Self-pay | Admitting: Family Medicine

## 2017-05-07 DIAGNOSIS — E782 Mixed hyperlipidemia: Secondary | ICD-10-CM

## 2017-05-07 MED ORDER — PITAVASTATIN CALCIUM 2 MG PO TABS: ORAL_TABLET | ORAL | 1 refills | Status: DC

## 2017-05-07 NOTE — Progress Notes (Signed)
Start livalo; recheck lipids in 6 weeks

## 2017-05-08 ENCOUNTER — Other Ambulatory Visit: Payer: Self-pay | Admitting: Family Medicine

## 2017-05-08 MED ORDER — ATORVASTATIN CALCIUM 10 MG PO TABS: 10.0000 mg | ORAL_TABLET | Freq: Every day | ORAL | 1 refills | Status: DC

## 2017-05-08 NOTE — Progress Notes (Signed)
Pt wishes to use 10 mg lipitor daily, recheck labs in Sept

## 2017-05-09 ENCOUNTER — Ambulatory Visit: Payer: Self-pay | Admitting: Family Medicine

## 2017-05-27 ENCOUNTER — Encounter: Payer: Self-pay | Admitting: Family Medicine

## 2017-05-27 ENCOUNTER — Ambulatory Visit (INDEPENDENT_AMBULATORY_CARE_PROVIDER_SITE_OTHER): Payer: Medicaid Other | Admitting: Family Medicine

## 2017-05-27 ENCOUNTER — Other Ambulatory Visit: Payer: Self-pay | Admitting: Family Medicine

## 2017-05-27 VITALS — BP 126/80 | HR 96 | Temp 98.5°F | Resp 16 | Ht 63.0 in | Wt 222.2 lb

## 2017-05-27 DIAGNOSIS — L2489 Irritant contact dermatitis due to other agents: Secondary | ICD-10-CM

## 2017-05-27 DIAGNOSIS — L03116 Cellulitis of left lower limb: Secondary | ICD-10-CM | POA: Diagnosis not present

## 2017-05-27 MED ORDER — ATORVASTATIN CALCIUM 10 MG PO TABS: 10.0000 mg | ORAL_TABLET | Freq: Every day | ORAL | 1 refills | Status: DC

## 2017-05-27 MED ORDER — PREDNISONE 20 MG PO TABS: 30.0000 mg | ORAL_TABLET | Freq: Every day | ORAL | 0 refills | Status: DC

## 2017-05-27 MED ORDER — DOXYCYCLINE HYCLATE 100 MG PO TABS: 100.0000 mg | ORAL_TABLET | Freq: Two times a day (BID) | ORAL | 0 refills | Status: AC

## 2017-05-27 NOTE — Progress Notes (Signed)
12  

## 2017-05-27 NOTE — Telephone Encounter (Signed)
Pt was prescribed atorvastatin. She is requesting a refill. States that you had changed the directions from every other day to once a day. Please send to [facility]. Also please change her pharmacy in the system. Thank you

## 2017-05-27 NOTE — Patient Instructions (Addendum)
Cellulitis, Adult Cellulitis is a skin infection. The infected area is usually red and sore. This condition occurs most often in the arms and lower legs. It is very important to get treated for this condition. Follow these instructions at home:  Take over-the-counter and prescription medicines only as told by your doctor.  If you were prescribed an antibiotic medicine, take it as told by your doctor. Do not stop taking the antibiotic even if you start to feel better.  Drink enough fluid to keep your pee (urine) clear or pale yellow.  Do not touch or rub the infected area.  Raise (elevate) the infected area above the level of your heart while you are sitting or lying down.  Place warm or cold wet cloths (warm or cold compresses) on the infected area. Do this as told by your doctor.  Keep all follow-up visits as told by your doctor. This is important. These visits let your doctor make sure your infection is not getting worse. Contact a doctor if:  You have a fever.  Your symptoms do not get better after 1-2 days of treatment.  Your bone or joint under the infected area starts to hurt after the skin has healed.  Your infection comes back. This can happen in the same area or another area.  You have a swollen bump in the infected area.  You have new symptoms.  You feel ill and also have muscle aches and pains. Get help right away if:  Your symptoms get worse.  You feel very sleepy.  You throw up (vomit) or have watery poop (diarrhea) for a long time.  There are red streaks coming from the infected area.  Your red area gets larger.  Your red area turns darker. This information is not intended to replace advice given to you by your health care provider. Make sure you discuss any questions you have with your health care provider. Document Released: 03/04/2008 Document Revised: 02/22/2016 Document Reviewed: 07/26/2015 Elsevier Interactive Patient Education  2018 [place] Dermatitis Dermatitis is redness, soreness, and swelling (inflammation) of the skin. Contact dermatitis is a reaction to certain substances that touch the skin. You either touched something that irritated your skin, or you have allergies to something you touched. Follow these instructions at home: [place] your skin as needed.  Apply cool compresses to the affected areas.  Try taking a bath with: ? Epsom salts. Follow the instructions on the package. You can get these at a pharmacy or grocery store. ? Baking soda. Pour a small amount into the bath as told by your doctor. ? Colloidal oatmeal. Follow the instructions on the package. You can get this at a pharmacy or grocery store.  Try applying baking soda paste to your skin. Stir water into baking soda until it looks like paste.  Do not scratch your skin.  Bathe less often.  Bathe in lukewarm water. Avoid using hot water. Medicines  Take or apply over-the-counter and prescription medicines only as told by your doctor.  If you were prescribed an antibiotic medicine, take or apply your antibiotic as told by your doctor. Do not stop taking the antibiotic even if your condition starts to get better. General instructions  Keep all follow-up visits as told by your doctor. This is important.  Avoid the substance that caused your reaction. If you do not know what caused it, keep a journal to try to track what caused it. Write down: ? What you eat. ?  What cosmetic products you use. ? What you drink. ? What you wear in the affected area. This includes jewelry.  If you were given a bandage (dressing), take care of it as told by your doctor. This includes when to change and remove it. Contact a doctor if:  You do not get better with treatment.  Your condition gets worse.  You have signs of infection such as: ? Swelling. ? Tenderness. ? Redness. ? Soreness. ? Warmth.  You have a fever.  You have new  symptoms. Get help right away if:  You have a very bad headache.  You have neck pain.  Your neck is stiff.  You throw up (vomit).  You feel very sleepy.  You see red streaks coming from the affected area.  Your bone or joint underneath the affected area becomes painful after the skin has healed.  The affected area turns darker.  You have trouble breathing. This information is not intended to replace advice given to you by your health care provider. Make sure you discuss any questions you have with your health care provider. Document Released: 07/14/2009 Document Revised: 02/22/2016 Document Reviewed: 02/01/2015 Elsevier Interactive Patient Education  2018 [employer].

## 2017-05-27 NOTE — Progress Notes (Addendum)
Name: Dana Bradley   MRN: [id number]    DOB: [birth year]   Date:05/27/2017       Progress Note  Subjective  Chief Complaint  No chief complaint on file.   HPI  PT presents with LEFT lower leg/ankle erythema, pain, and yellow drainage that began Saturday (4 days ago) after being outside gardening. She thought she got into poison while gardening, but it has continued to spread quickly.  She has never had a blood clot, is not on hormonal birth control, is a former smoker, is obese.  On 05/02/2017 had Nuc Med Pulmonary Scan and was negative for clot - she denies calf tenderness, shortness of breath, or chest pain.  Patient Active Problem List   Diagnosis Date Noted  . Positive D-dimer 05/02/2017  . Lumbar L1-2 disc protrusion (Right) 04/07/2017  . Acute postoperative pain 04/07/2017  . Muscle spasticity 04/01/2017  . 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 Lumbar spondylosis 01/06/2017  . Chronic left hip pain 12/24/2016  . Chronic sacroiliac joint pain (Left) 12/24/2016  . Lumbar facet joint syndrome (B) (L>R) 12/24/2016  . Left lumbar radiculitis 12/24/2016  . Hypertriglyceridemia 11/27/2016  . Intractable headache 09/09/2016  . Medication monitoring encounter 08/29/2016  . Controlled substance agreement signed 08/28/2016  . Plantar fasciitis of left foot 08/28/2016  . Vitamin B12 deficiency 08/28/2016  . Hyperlipidemia 08/28/2016  . Nephrolithiasis 08/12/2016  . Chronic pain syndrome 08/07/2016  . Long term prescription opiate use 08/07/2016  . Opiate use 08/07/2016  . Long term prescription benzodiazepine use 08/07/2016  . Neurogenic pain 08/07/2016  . Chronic low back pain (Location of Primary Source of Pain) (Bilateral) (R>L) (midline) 08/07/2016  . Chronic upper back pain (Location of Secondary source of pain) (Bilateral) (L>R) 08/07/2016  . Chronic abdominal pain (Right lower quadrant) 08/07/2016  . Thoracic radiculitis (Right) (T11 dermatome) 08/07/2016  . Chronic  occipital neuralgia (Location of Tertiary source of pain) (Bilateral) (L>R) 08/07/2016  . Chronic neck pain 08/07/2016  . Chronic cervical radicular pain (Bilateral) (L>R) 08/07/2016  . Chronic shoulder blade pain (Bilateral) (L>R) 08/07/2016  . Chronic upper extremity pain (Bilateral) (R>L) 08/07/2016  . Chronic knee pain (Bilateral) (R>L) 08/07/2016  . Chronic ankle pain (Bilateral) 08/07/2016  . Cervical spondylosis with myelopathy and radiculopathy 08/07/2016  . Panic disorder with agoraphobia 05/29/2016  . Depression, unspecified depression type 05/29/2016  . Atypical lymphocytosis 05/01/2016  . Vitamin D insufficiency 05/01/2016  . Chronic lower extremity cramps (Bilateral) 04/29/2016  . Obesity 04/29/2016  . GAD (generalized anxiety disorder) 04/29/2016  . Fatigue 04/29/2016  . Insomnia 07/12/2015  . Migraine without aura and with status migrainosus, not intractable 07/12/2015  . Chronic superficial gastritis 06/02/2015  . Chronic pain of multiple joints 05/15/2015  . Bilateral leg edema 05/02/2015  . Paroxysmal supraventricular tachycardia ([place]) 04/17/2015  . Bright red rectal bleeding 04/06/2015  . DDD (degenerative disc disease), lumbosacral 01/24/2014  . Degeneration of intervertebral disc of lumbosacral region 01/24/2014  . Cervico-occipital neuralgia 12/29/2013  . Fibromyalgia 12/29/2013  . Migraine headache 12/29/2013  . Menorrhagia 12/10/2012  . Depression, major, recurrent, in remission ([place]) 01/12/2009  . Hypertension, benign essential, goal below 140/90 06/23/2008  . History of PSVT (paroxysmal supraventricular tachycardia) 06/17/2008  . Obstructive sleep apnea, adult 06/17/2008  . GERD 06/13/2008    Social History  Substance Use Topics  . Smoking status: Former Smoker    Packs/day: 4.00    Years: 3.00    Types: Cigarettes    Quit date: 12/10/1992  .  Smokeless tobacco: Never Used  . Alcohol use No     Current Outpatient Prescriptions:  .   acetaminophen (TYLENOL) 325 MG tablet, Take 650 mg by mouth daily. , Disp: , Rfl:  .  ALPRAZolam (XANAX) 1 MG tablet, Take 1 tablet (1 mg total) by mouth at bedtime as needed for anxiety., Disp: 90 tablet, Rfl: 1 .  atorvastatin (LIPITOR) 10 MG tablet, Take 1 tablet (10 mg total) by mouth at bedtime., Disp: 30 tablet, Rfl: 1 .  baclofen (LIORESAL) 10 MG tablet, Take 1 tablet (10 mg total) by mouth 3 (three) times daily. (Patient taking differently: Take 10 mg by mouth as needed. ), Disp: 90 tablet, Rfl: 2 .  Calcium-Vitamin D-Vitamin K 161-096-04 MG-UNT-MCG TABS, Take 1 tablet by mouth daily. , Disp: , Rfl:  .  Cyanocobalamin (VITAMIN B-12 PO), Take 1,500 mcg by mouth daily., Disp: , Rfl:  .  diclofenac sodium (VOLTAREN) 1 % GEL, Apply 2 g topically 4 (four) times daily., Disp: 1 Tube, Rfl: 2 .  HYDROcodone-acetaminophen (NORCO) 7.5-325 MG tablet, Take 1 tablet by mouth every 6 (six) hours as needed for moderate pain., Disp: 30 tablet, Rfl: 0 .  lamoTRIgine (LAMICTAL) 100 MG tablet, Take 100 mg by mouth 2 (two) times daily., Disp: , Rfl:  .  magnesium 30 MG tablet, Take 30 mg by mouth daily. , Disp: , Rfl:  .  metoprolol (LOPRESSOR) 50 MG tablet, Take 1 tablet (50 mg total) by mouth 2 (two) times daily., Disp: 180 tablet, Rfl: 3 .  omega-3 acid ethyl esters (LOVAZA) 1 g capsule, Take 2 capsules (2 g total) by mouth 2 (two) times daily., Disp: 120 capsule, Rfl: 11 .  pregabalin (LYRICA) 150 MG capsule, Take 1 capsule (150 mg total) by mouth every 8 (eight) hours., Disp: 90 capsule, Rfl: 2 .  Valerian 100 MG CAPS, Take 100 mg by mouth as needed., Disp: , Rfl:   Allergies  Allergen Reactions  . Aspirin Swelling  . Cymbalta [Duloxetine Hcl] Other (See Comments)    Suicidal ideations and has homicidal thoughts per patient  . Demerol [Meperidine] Nausea And Vomiting    Patient projectile vomits and usually result in ER  . Depakote [Divalproex Sodium] Shortness Of Breath    W/ n/v  . Haloperidol  Shortness Of Breath    W/ n/v  . Iodinated Diagnostic Agents Other (See Comments)  . Reglan [Metoclopramide] Shortness Of Breath    Can't breath, wheezes  . Tramadol Hcl Palpitations    Severely and adversely affects her SVT giving her tachycardias of 150-160 bpm.  . Trazodone Shortness Of Breath  . Compazine [Prochlorperazine] Other (See Comments)    Panic attack  . Meloxicam Other (See Comments)    mouth sores, tingling, blisters in mouth  . Penicillins Rash  . Tomato Hives    Tongue will blister  . Shellfish-Derived Products Other (See Comments)  . Bacitracin-Neomycin-Polymyxin Rash  . Cephalosporins Rash    rash  . Ibuprofen Other (See Comments) and Rash    Blisters in mouth. Blisters in mouth.  . Latex Itching  . Neosporin [Neomycin-Bacitracin Zn-Polymyx] Rash  . Nsaids Other (See Comments)    Blisters in mouth; can take 1 ibuprofen 2x a month  . Sulfonamide Derivatives Rash    ROS  Constitutional: Negative for fever or weight change. Positive for mild fevers/chills Respiratory: Negative for cough and shortness of breath.   Cardiovascular: Negative for chest pain or palpitations.  Gastrointestinal: Negative for abdominal pain, no bowel  changes.  Musculoskeletal: Negative for gait problem or joint swelling.  Skin: See HPI Neurological: Negative for dizziness or headache.  No other specific complaints in a complete review of systems (except as listed in HPI above).  Objective  Vitals:   05/27/17 0847  BP: 126/80  Pulse: 96  Resp: 16  Temp: 98.5 F (36.9 C)  TempSrc: Oral  SpO2: 97%  Weight: 222 lb 3.2 oz (100.8 kg)  Height: 5\' 3"  (1.6 m)    Body mass index is 39.36 kg/m.   Nursing Note and Vital Signs reviewed.  Physical Exam  Constitutional: Patient appears well-developed and well-nourished. Obese No distress.  HEENT: head atraumatic, normocephalic Cardiovascular: Normal rate, regular rhythm, S1/S2 present.  No murmur or rub heard. Non-pitting edema  to LEFT LE. Pulmonary/Chest: Effort normal and breath sounds clear. No respiratory distress or retractions. Psychiatric: Patient has a normal mood and affect. behavior is normal. Judgment and thought content normal. MSK: No gait issues, no joint swelling, strength is equal bilaterally, no bony tenderness. Skin: Circumferential erythema and edema with yellow weeping to LEFT shin and upper ankle.  Positive for tenderness where erythema is present. Negative holman's.   Recent Results (from the past 2160 hour(s))  ToxASSURE Select 13 (MW), Urine     Status: None   Collection Time: 04/01/17  3:36 PM  Result Value Ref Range   Summary FINAL     Comment: ==================================================================== TOXASSURE SELECT 13 (MW) ==================================================================== Test                             Result       Flag       Units Drug Present and Declared for Prescription Verification   Hydrocodone                    366          EXPECTED   ng/mg creat   Hydromorphone                  121          EXPECTED   ng/mg creat   Dihydrocodeine                 34           EXPECTED   ng/mg creat   Norhydrocodone                 295          EXPECTED   ng/mg creat    Sources of hydrocodone include scheduled prescription    medications. Hydromorphone, dihydrocodeine and norhydrocodone are    expected metabolites of hydrocodone. Hydromorphone and    dihydrocodeine are also available as scheduled prescription    medications. Drug Absent but Declared for Prescription Verification   Alprazolam                     Not Detected UNEXPECTED ng/mg creat ======================================= ============================= Test                      Result    Flag   Units      Ref Range   Creatinine              174              mg/dL      06/02/17 ==================================================================== Declared Medications:  The flagging and  interpretation  on this report are based on the  following declared medications.  Unexpected results may arise from  inaccuracies in the declared medications.  **Note: The testing scope of this panel includes these medications:  Alprazolam (Xanax)  Hydrocodone (Norco)  **Note: The testing scope of this panel does not include following  reported medications:  Acetaminophen (Norco)  Amitriptyline  Atorvastatin  Baclofen (Lioresal)  Calcium  Cyanocobalamin  Diclofenac  Lamotrigine  Magnesium  Metoprolol (Lopressor)  Omega-3 Fatty Acids (Lovaza)  Pregabalin (Lyrica)  Vitamin D  Vitamin K ==================================================================== For clinical consultation, please call [phone]. ====================================================================   Comprehensive metabolic panel     Status: Abnormal   Collection Time: 04/01/17  3:38 PM  Result Value Ref Range   Glucose 92 65 - 99 mg/dL   BUN 12 6 - 24 mg/dL   Creatinine, Ser 0.67 0.57 - 1.00 mg/dL   GFR calc non Af Amer 106 >59 mL/min/1.73   GFR calc Af Amer 123 >59 mL/min/1.73   BUN/Creatinine Ratio 18 9 - 23   Sodium 141 134 - 144 mmol/L   Potassium 3.9 3.5 - 5.2 mmol/L   Chloride 101 96 - 106 mmol/L   CO2 24 20 - 29 mmol/L    Comment:               **Please note reference interval change**   Calcium 9.7 8.7 - 10.2 mg/dL   Total Protein 6.6 6.0 - 8.5 g/dL   Albumin 4.1 3.5 - 5.5 g/dL   Globulin, Total 2.5 1.5 - 4.5 g/dL   Albumin/Globulin Ratio 1.6 1.2 - 2.2   Bilirubin Total 0.5 0.0 - 1.2 mg/dL   Alkaline Phosphatase 84 39 - 117 IU/L   AST 25 0 - 40 IU/L   ALT 37 (H) 0 - 32 IU/L  25-Hydroxyvitamin D Lcms D2+D3     Status: Abnormal   Collection Time: 04/01/17  3:38 PM  Result Value Ref Range   25-Hydroxy, Vitamin D 20 (L) ng/mL    Comment: Reference Range: All Ages: Target levels 30 - 100    25-Hydroxy, Vitamin D-2 5.4 ng/mL   25-Hydroxy, Vitamin D-3 15 ng/mL  Sedimentation rate     Status: None    Collection Time: 04/01/17  3:38 PM  Result Value Ref Range   Sed Rate 11 0 - 32 mm/hr  Vitamin B12     Status: None   Collection Time: 04/01/17  3:38 PM  Result Value Ref Range   Vitamin B-12 606 232 - 1,245 pg/mL  Magnesium     Status: None   Collection Time: 04/01/17  3:38 PM  Result Value Ref Range   Magnesium 2.1 1.6 - 2.3 mg/dL  C-reactive protein     Status: Abnormal   Collection Time: 04/01/17  3:38 PM  Result Value Ref Range   CRP 10.5 (H) 0.0 - 4.9 mg/L  Basic metabolic panel     Status: Abnormal   Collection Time: 04/15/17  4:50 PM  Result Value Ref Range   Sodium 138 135 - 145 mmol/L   Potassium 4.5 3.5 - 5.1 mmol/L   Chloride 100 (L) 101 - 111 mmol/L   CO2 29 22 - 32 mmol/L   Glucose, Bld 112 (H) 65 - 99 mg/dL   BUN 18 6 - 20 mg/dL   Creatinine, Ser 1.13 (H) 0.44 - 1.00 mg/dL   Calcium 9.9 8.9 - 10.3 mg/dL   GFR calc non Af Amer 58 (L) >60 mL/min  GFR calc Af Amer >60 >60 mL/min    Comment: (NOTE) The eGFR has been calculated using the CKD EPI equation. This calculation has not been validated in all clinical situations. eGFR's persistently <60 mL/min signify possible Chronic Kidney Disease.    Anion gap 9 5 - 15  CBC     Status: Abnormal   Collection Time: 04/15/17  4:50 PM  Result Value Ref Range   WBC 11.3 (H) 3.6 - 11.0 K/uL   RBC 4.48 3.80 - 5.20 MIL/uL   Hemoglobin 14.5 12.0 - 16.0 g/dL   HCT 42.8 35.0 - 47.0 %   MCV 95.6 80.0 - 100.0 fL   MCH 32.3 26.0 - 34.0 pg   MCHC 33.8 32.0 - 36.0 g/dL   RDW 14.2 11.5 - 14.5 %   Platelets 233 150 - 440 K/uL  Troponin I     Status: None   Collection Time: 04/15/17  4:50 PM  Result Value Ref Range   Troponin I <0.03 <0.03 ng/mL  Troponin I     Status: None   Collection Time: 04/15/17  7:48 PM  Result Value Ref Range   Troponin I <0.03 <0.03 ng/mL  Lipid panel     Status: Abnormal   Collection Time: 05/02/17  8:29 AM  Result Value Ref Range   Cholesterol 264 (H) <200 mg/dL   Triglycerides 263 (H) <150  mg/dL   HDL 47 (L) >50 mg/dL   Total CHOL/HDL Ratio 5.6 (H) <5.0 Ratio   VLDL 53 (H) <30 mg/dL   LDL Cholesterol 164 (H) <100 mg/dL  CBC with Differential/Platelet     Status: Abnormal   Collection Time: 05/02/17  8:30 AM  Result Value Ref Range   WBC 6.2 3.8 - 10.8 K/uL   RBC 4.28 3.80 - 5.10 MIL/uL   Hemoglobin 14.2 11.7 - 15.5 g/dL   HCT 41.3 35.0 - 45.0 %   MCV 96.5 80.0 - 100.0 fL   MCH 33.2 (H) 27.0 - 33.0 pg   MCHC 34.4 32.0 - 36.0 g/dL   RDW 13.4 11.0 - 15.0 %   Platelets 206 140 - 400 K/uL   MPV 9.3 7.5 - 12.5 fL   Neutro Abs 4,030 1,500 - 7,800 cells/uL   Lymphs Abs 1,674 850 - 3,900 cells/uL   Monocytes Absolute 372 200 - 950 cells/uL   Eosinophils Absolute 124 15 - 500 cells/uL   Basophils Absolute 0 0 - 200 cells/uL   Neutrophils Relative % 65 %   Lymphocytes Relative 27 %   Monocytes Relative 6 %   Eosinophils Relative 2 %   Basophils Relative 0 %   Smear Review Criteria for review not met   D-dimer, quantitative (not at [facility])     Status: Abnormal   Collection Time: 05/02/17  8:30 AM  Result Value Ref Range   D-Dimer, Quant 0.57 (H) <0.50 mcg/mL FEU    Comment:   The D-Dimer test is used frequently to exclude an acute PE or DVT.  In patients with a low to moderate clinical risk assessment and a D-Dimer result <0.50 mcg/mL FEU, the likelihood of a PE or DVT is very low.  However, a thromboembolic event should not be excluded solely on the basis of the D-Dimer level.  Increased levels of D-Dimer are associated with a PE, DVT, DIC, malignancies, inflammation, sepsis, surgery, trauma, pregnancy, and advancing patient age. [[place] 2006 11:295(2): 301-601]   For additional information, please refer to: http://education.quest[employer].com/faq/FAQ149 (This link is being provided for information/  educational purposes only)     TSH     Status: None   Collection Time: 05/02/17  8:30 AM  Result Value Ref Range   TSH 2.63 mIU/L    Comment:   Reference Range    > or = 20 Years  0.40-4.50   Pregnancy Range First trimester  0.26-2.66 Second trimester 0.55-2.73 Third trimester  0.43-2.91     C-reactive protein     Status: None   Collection Time: 05/02/17  8:30 AM  Result Value Ref Range   CRP 4.5 <8.0 mg/L  Brain natriuretic peptide     Status: None   Collection Time: 05/02/17  8:30 AM  Result Value Ref Range   Brain Natriuretic Peptide 14.2 <100 pg/mL    Comment:   BNP levels increase with age in the general population with the highest values seen in individuals greater than 26 years of age. Reference: Joellyn Rued Cardiol 2002; 44:010-27.     Basic metabolic panel     Status: Abnormal   Collection Time: 05/02/17  5:45 PM  Result Value Ref Range   Sodium 141 135 - 145 mmol/L   Potassium 3.6 3.5 - 5.1 mmol/L   Chloride 105 101 - 111 mmol/L   CO2 27 22 - 32 mmol/L   Glucose, Bld 106 (H) 65 - 99 mg/dL   BUN 13 6 - 20 mg/dL   Creatinine, Ser 0.69 0.44 - 1.00 mg/dL   Calcium 9.6 8.9 - 10.3 mg/dL   GFR calc non Af Amer >60 >60 mL/min   GFR calc Af Amer >60 >60 mL/min    Comment: (NOTE) The eGFR has been calculated using the CKD EPI equation. This calculation has not been validated in all clinical situations. eGFR's persistently <60 mL/min signify possible Chronic Kidney Disease.    Anion gap 9 5 - 15  CBC     Status: None   Collection Time: 05/02/17  5:45 PM  Result Value Ref Range   WBC 7.3 3.6 - 11.0 K/uL   RBC 4.33 3.80 - 5.20 MIL/uL   Hemoglobin 14.4 12.0 - 16.0 g/dL   HCT 42.0 35.0 - 47.0 %   MCV 97.0 80.0 - 100.0 fL   MCH 33.2 26.0 - 34.0 pg   MCHC 34.2 32.0 - 36.0 g/dL   RDW 13.4 11.5 - 14.5 %   Platelets 214 150 - 440 K/uL  Troponin I     Status: None   Collection Time: 05/02/17  5:45 PM  Result Value Ref Range   Troponin I <0.03 <0.03 ng/mL     Assessment & Plan  1. Cellulitis of left lower extremity - doxycycline (VIBRA-TABS) 100 MG tablet; Take 1 tablet (100 mg total) by mouth 2 (two) times daily.  Dispense:  14 tablet; Refill: 0 - Patient has several allergies to antibiotics, and these are reviewed prior to prescribing doxycycline - pt has done well on this medication in the past. 2. Irritant contact dermatitis due to other agents - predniSONE (DELTASONE) 20 MG tablet; Take 1.5 tablets (30 mg total) by mouth daily with breakfast.  Dispense: 7.5 tablet; Refill: 0  - Discussed plan of care with Dr. Sanda Klein, PCP. Advised this is likely cellulitis secondary to a contact dermatitis from gardening. We will treat for both today and have close follow up in 4 days (sooner if worsening/unimproving).   -Red flags and when to present for emergency care or RTC including fever >101.37F, chest pain, shortness of breath, new/worsening/un-resolving symptoms, increase in redness/swelling/pain/drainage,  any bleeding,  reviewed with patient at time of visit. Follow up and care instructions discussed and provided in AVS.  I have reviewed this encounter including the documentation in this note and/or discussed this patient with the Johney Maine, FNP, NP-C. I am certifying that I agree with the content of this note as supervising physician.  Steele Sizer, MD [facility] 05/27/2017, 12:08 PM

## 2017-05-30 ENCOUNTER — Ambulatory Visit (INDEPENDENT_AMBULATORY_CARE_PROVIDER_SITE_OTHER): Payer: Medicaid Other | Admitting: Family Medicine

## 2017-05-30 ENCOUNTER — Encounter: Payer: Self-pay | Admitting: Family Medicine

## 2017-05-30 VITALS — BP 128/68 | HR 92 | Temp 98.0°F | Resp 16 | Ht 63.0 in | Wt 219.6 lb

## 2017-05-30 DIAGNOSIS — L2489 Irritant contact dermatitis due to other agents: Secondary | ICD-10-CM | POA: Diagnosis not present

## 2017-05-30 DIAGNOSIS — L03116 Cellulitis of left lower limb: Secondary | ICD-10-CM | POA: Diagnosis not present

## 2017-05-30 MED ORDER — HYDROXYZINE HCL 50 MG PO TABS: 50.0000 mg | ORAL_TABLET | Freq: Three times a day (TID) | ORAL | 0 refills | Status: DC | PRN

## 2017-05-30 MED ORDER — PREDNISONE 20 MG PO TABS: 20.0000 mg | ORAL_TABLET | Freq: Every day | ORAL | 0 refills | Status: AC

## 2017-05-30 NOTE — Patient Instructions (Addendum)
Cellulitis, Adult Cellulitis is a skin infection. The infected area is usually red and sore. This condition occurs most often in the arms and lower legs. It is very important to get treated for this condition. Follow these instructions at home:  Take over-the-counter and prescription medicines only as told by your doctor.  If you were prescribed an antibiotic medicine, take it as told by your doctor. Do not stop taking the antibiotic even if you start to feel better.  Drink enough fluid to keep your pee (urine) clear or pale yellow.  Do not touch or rub the infected area.  Raise (elevate) the infected area above the level of your heart while you are sitting or lying down.  Place warm or cold wet cloths (warm or cold compresses) on the infected area. Do this as told by your doctor.  Keep all follow-up visits as told by your doctor. This is important. These visits let your doctor make sure your infection is not getting worse. Contact a doctor if:  You have a fever.  Your symptoms do not get better after 1-2 days of treatment.  Your bone or joint under the infected area starts to hurt after the skin has healed.  Your infection comes back. This can happen in the same area or another area.  You have a swollen bump in the infected area.  You have new symptoms.  You feel ill and also have muscle aches and pains. Get help right away if:  Your symptoms get worse.  You feel very sleepy.  You throw up (vomit) or have watery poop (diarrhea) for a long time.  There are red streaks coming from the infected area.  Your red area gets larger.  Your red area turns darker. This information is not intended to replace advice given to you by your health care provider. Make sure you discuss any questions you have with your health care provider. Document Released: 03/04/2008 Document Revised: 02/22/2016 Document Reviewed: 07/26/2015 Elsevier Interactive Patient Education  2018 [place] Dermatitis Dermatitis is redness, soreness, and swelling (inflammation) of the skin. Contact dermatitis is a reaction to certain substances that touch the skin. You either touched something that irritated your skin, or you have allergies to something you touched. Follow these instructions at home: [place] your skin as needed.  Apply cool compresses to the affected areas.  Try taking a bath with: ? Epsom salts. Follow the instructions on the package. You can get these at a pharmacy or grocery store. ? Baking soda. Pour a small amount into the bath as told by your doctor. ? Colloidal oatmeal. Follow the instructions on the package. You can get this at a pharmacy or grocery store.  Try applying baking soda paste to your skin. Stir water into baking soda until it looks like paste.  Do not scratch your skin.  Bathe less often.  Bathe in lukewarm water. Avoid using hot water. Medicines  Take or apply over-the-counter and prescription medicines only as told by your doctor.  If you were prescribed an antibiotic medicine, take or apply your antibiotic as told by your doctor. Do not stop taking the antibiotic even if your condition starts to get better. General instructions  Keep all follow-up visits as told by your doctor. This is important.  Avoid the substance that caused your reaction. If you do not know what caused it, keep a journal to try to track what caused it. Write down: ? What you eat. ?  What cosmetic products you use. ? What you drink. ? What you wear in the affected area. This includes jewelry.  If you were given a bandage (dressing), take care of it as told by your doctor. This includes when to change and remove it. Contact a doctor if:  You do not get better with treatment.  Your condition gets worse.  You have signs of infection such as: ? Swelling. ? Tenderness. ? Redness. ? Soreness. ? Warmth.  You have a fever.  You have new  symptoms. Get help right away if:  You have a very bad headache.  You have neck pain.  Your neck is stiff.  You throw up (vomit).  You feel very sleepy.  You see red streaks coming from the affected area.  Your bone or joint underneath the affected area becomes painful after the skin has healed.  The affected area turns darker.  You have trouble breathing. This information is not intended to replace advice given to you by your health care provider. Make sure you discuss any questions you have with your health care provider. Document Released: 07/14/2009 Document Revised: 02/22/2016 Document Reviewed: 02/01/2015 Elsevier Interactive Patient Education  2018 [employer].

## 2017-05-30 NOTE — Progress Notes (Addendum)
Name: Dana Bradley   MRN: [id number]    DOB: [birth year]   Date:05/30/2017       Progress Note  Subjective  Chief Complaint  Chief Complaint  Patient presents with  . Cellulitis    3 day day follow up    HPI  Pt presents for follow up on contact dermatitis with cellulitis. She has been slowly improving but continues to have significant redness and small amount of serous drainage - no longer circumferential - mostly anterior.  She has been taking benadryl, claritin, and chlortabs; has been taking prednisone and doxycyline as prescribed too - still having significant itching and pain is about a 2/10.  Patient Active Problem List   Diagnosis Date Noted  . Positive D-dimer 05/02/2017  . Lumbar L1-2 disc protrusion (Right) 04/07/2017  . Acute postoperative pain 04/07/2017  . Muscle spasticity 04/01/2017  . 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 Lumbar spondylosis 01/06/2017  . Chronic left hip pain 12/24/2016  . Chronic sacroiliac joint pain (Left) 12/24/2016  . Lumbar facet joint syndrome (B) (L>R) 12/24/2016  . Left lumbar radiculitis 12/24/2016  . Hypertriglyceridemia 11/27/2016  . Intractable headache 09/09/2016  . Medication monitoring encounter 08/29/2016  . Controlled substance agreement signed 08/28/2016  . Plantar fasciitis of left foot 08/28/2016  . Vitamin B12 deficiency 08/28/2016  . Hyperlipidemia 08/28/2016  . Nephrolithiasis 08/12/2016  . Chronic pain syndrome 08/07/2016  . Long term prescription opiate use 08/07/2016  . Opiate use 08/07/2016  . Long term prescription benzodiazepine use 08/07/2016  . Neurogenic pain 08/07/2016  . Chronic low back pain (Location of Primary Source of Pain) (Bilateral) (R>L) (midline) 08/07/2016  . Chronic upper back pain (Location of Secondary source of pain) (Bilateral) (L>R) 08/07/2016  . Chronic abdominal pain (Right lower quadrant) 08/07/2016  . Thoracic radiculitis (Right) (T11 dermatome) 08/07/2016  . Chronic occipital  neuralgia (Location of Tertiary source of pain) (Bilateral) (L>R) 08/07/2016  . Chronic neck pain 08/07/2016  . Chronic cervical radicular pain (Bilateral) (L>R) 08/07/2016  . Chronic shoulder blade pain (Bilateral) (L>R) 08/07/2016  . Chronic upper extremity pain (Bilateral) (R>L) 08/07/2016  . Chronic knee pain (Bilateral) (R>L) 08/07/2016  . Chronic ankle pain (Bilateral) 08/07/2016  . Cervical spondylosis with myelopathy and radiculopathy 08/07/2016  . Panic disorder with agoraphobia 05/29/2016  . Depression, unspecified depression type 05/29/2016  . Atypical lymphocytosis 05/01/2016  . Vitamin D insufficiency 05/01/2016  . Chronic lower extremity cramps (Bilateral) 04/29/2016  . Obesity 04/29/2016  . GAD (generalized anxiety disorder) 04/29/2016  . Fatigue 04/29/2016  . Insomnia 07/12/2015  . Migraine without aura and with status migrainosus, not intractable 07/12/2015  . Chronic superficial gastritis 06/02/2015  . Chronic pain of multiple joints 05/15/2015  . Bilateral leg edema 05/02/2015  . Paroxysmal supraventricular tachycardia ([place]) 04/17/2015  . Bright red rectal bleeding 04/06/2015  . DDD (degenerative disc disease), lumbosacral 01/24/2014  . Degeneration of intervertebral disc of lumbosacral region 01/24/2014  . Cervico-occipital neuralgia 12/29/2013  . Fibromyalgia 12/29/2013  . Migraine headache 12/29/2013  . Menorrhagia 12/10/2012  . Depression, major, recurrent, in remission ([place]) 01/12/2009  . Hypertension, benign essential, goal below 140/90 06/23/2008  . History of PSVT (paroxysmal supraventricular tachycardia) 06/17/2008  . Obstructive sleep apnea, adult 06/17/2008  . GERD 06/13/2008    Social History  Substance Use Topics  . Smoking status: Former Smoker    Packs/day: 4.00    Years: 3.00    Types: Cigarettes    Quit date: 12/10/1992  . Smokeless tobacco: Never Used  .  Alcohol use No     Current Outpatient Prescriptions:  .  acetaminophen  (TYLENOL) 325 MG tablet, Take 650 mg by mouth daily. , Disp: , Rfl:  .  ALPRAZolam (XANAX) 1 MG tablet, Take 1 tablet (1 mg total) by mouth at bedtime as needed for anxiety., Disp: 90 tablet, Rfl: 1 .  atorvastatin (LIPITOR) 10 MG tablet, Take 1 tablet (10 mg total) by mouth at bedtime., Disp: 30 tablet, Rfl: 1 .  baclofen (LIORESAL) 10 MG tablet, Take 1 tablet (10 mg total) by mouth 3 (three) times daily. (Patient taking differently: Take 10 mg by mouth as needed. ), Disp: 90 tablet, Rfl: 2 .  Calcium-Vitamin D-Vitamin K 268-341-96 MG-UNT-MCG TABS, Take 1 tablet by mouth daily. , Disp: , Rfl:  .  Cyanocobalamin (VITAMIN B-12 PO), Take 1,500 mcg by mouth daily., Disp: , Rfl:  .  diclofenac sodium (VOLTAREN) 1 % GEL, Apply 2 g topically 4 (four) times daily., Disp: 1 Tube, Rfl: 2 .  doxycycline (VIBRA-TABS) 100 MG tablet, Take 1 tablet (100 mg total) by mouth 2 (two) times daily., Disp: 14 tablet, Rfl: 0 .  HYDROcodone-acetaminophen (NORCO) 7.5-325 MG tablet, Take 1 tablet by mouth every 6 (six) hours as needed for moderate pain., Disp: 30 tablet, Rfl: 0 .  lamoTRIgine (LAMICTAL) 100 MG tablet, Take 100 mg by mouth 2 (two) times daily., Disp: , Rfl:  .  magnesium 30 MG tablet, Take 30 mg by mouth daily. , Disp: , Rfl:  .  metoprolol (LOPRESSOR) 50 MG tablet, Take 1 tablet (50 mg total) by mouth 2 (two) times daily., Disp: 180 tablet, Rfl: 3 .  omega-3 acid ethyl esters (LOVAZA) 1 g capsule, Take 2 capsules (2 g total) by mouth 2 (two) times daily., Disp: 120 capsule, Rfl: 11 .  predniSONE (DELTASONE) 20 MG tablet, Take 1.5 tablets (30 mg total) by mouth daily with breakfast., Disp: 7.5 tablet, Rfl: 0 .  pregabalin (LYRICA) 150 MG capsule, Take 1 capsule (150 mg total) by mouth every 8 (eight) hours., Disp: 90 capsule, Rfl: 2 .  Valerian 100 MG CAPS, Take 100 mg by mouth as needed., Disp: , Rfl:   Allergies  Allergen Reactions  . Aspirin Swelling  . Cymbalta [Duloxetine Hcl] Other (See Comments)     Suicidal ideations and has homicidal thoughts per patient  . Demerol [Meperidine] Nausea And Vomiting    Patient projectile vomits and usually result in ER  . Depakote [Divalproex Sodium] Shortness Of Breath    W/ n/v  . Haloperidol Shortness Of Breath    W/ n/v  . Iodinated Diagnostic Agents Other (See Comments)  . Reglan [Metoclopramide] Shortness Of Breath    Can't breath, wheezes  . Tramadol Hcl Palpitations    Severely and adversely affects her SVT giving her tachycardias of 150-160 bpm.  . Trazodone Shortness Of Breath  . Compazine [Prochlorperazine] Other (See Comments)    Panic attack  . Meloxicam Other (See Comments)    mouth sores, tingling, blisters in mouth  . Penicillins Rash  . Tomato Hives    Tongue will blister  . Shellfish-Derived Products Other (See Comments)  . Bacitracin-Neomycin-Polymyxin Rash  . Cephalosporins Rash    rash  . Ibuprofen Other (See Comments) and Rash    Blisters in mouth. Blisters in mouth.  . Latex Itching  . Neosporin [Neomycin-Bacitracin Zn-Polymyx] Rash  . Nsaids Other (See Comments)    Blisters in mouth; can take 1 ibuprofen 2x a month  . Sulfonamide Derivatives Rash  ROS Constitutional: Negative for fever or weight change.  Respiratory: Negative for cough and shortness of breath.   Cardiovascular: Negative for chest pain or palpitations.  Gastrointestinal: Negative for abdominal pain, no bowel changes.  Musculoskeletal: Negative for gait problem or joint swelling.  Skin: See HPI Neurological: Negative for dizziness or headache.  No other specific complaints in a complete review of systems (except as listed in HPI above).  Objective  Vitals:   05/30/17 0844  BP: 128/68  Pulse: 92  Resp: 16  Temp: 98 F (36.7 C)  SpO2: 97%  Weight: 219 lb 9 oz (99.6 kg)  Height: _0  (1.6 m)   Body mass index is 38.89 kg/m.  Nursing Note and Vital Signs reviewed.  Physical Exam  Constitutional: Patient appears well-developed  and well-nourished. Obese No distress.  HEENT: head atraumatic, normocephalic Cardiovascular: Normal rate, regular rhythm, S1/S2 present.  No murmur or rub heard.Pulmonary/Chest: Effort normal and breath sounds clear. No respiratory distress or retractions. Psychiatric: Patient has a normal mood and affect. behavior is normal. Judgment and thought content normal. Skin: Erythema, mild edema, and small amount of serous drainage to anterior/lateral/medial LEFT shin and upper ankle.  Positive for tenderness where erythema is present. Appears to have improved since last visit- no longer circumferential, significantly less edema, erythema is moderately improved.  Recent Results (from the past 2160 hour(s))  ToxASSURE Select 13 (MW), Urine     Status: None   Collection Time: 04/01/17  3:36 PM  Result Value Ref Range   Summary FINAL     Comment: ==================================================================== TOXASSURE SELECT 13 (MW) ==================================================================== Test                             Result       Flag       Units Drug Present and Declared for Prescription Verification   Hydrocodone                    366          EXPECTED   ng/mg creat   Hydromorphone                  121          EXPECTED   ng/mg creat   Dihydrocodeine                 34           EXPECTED   ng/mg creat   Norhydrocodone                 295          EXPECTED   ng/mg creat    Sources of hydrocodone include scheduled prescription    medications. Hydromorphone, dihydrocodeine and norhydrocodone are    expected metabolites of hydrocodone. Hydromorphone and    dihydrocodeine are also available as scheduled prescription    medications. Drug Absent but Declared for Prescription Verification   Alprazolam                     Not Detected UNEXPECTED ng/mg creat ======================================= ============================= Test                      Result    Flag   Units       Ref Range   Creatinine              174  mg/dL      >=20 ==================================================================== Declared Medications:  The flagging and interpretation on this report are based on the  following declared medications.  Unexpected results may arise from  inaccuracies in the declared medications.  **Note: The testing scope of this panel includes these medications:  Alprazolam (Xanax)  Hydrocodone (Norco)  **Note: The testing scope of this panel does not include following  reported medications:  Acetaminophen (Norco)  Amitriptyline  Atorvastatin  Baclofen (Lioresal)  Calcium  Cyanocobalamin  Diclofenac  Lamotrigine  Magnesium  Metoprolol (Lopressor)  Omega-3 Fatty Acids (Lovaza)  Pregabalin (Lyrica)  Vitamin D  Vitamin K ==================================================================== For clinical consultation, please call [phone]. ====================================================================   Comprehensive metabolic panel     Status: Abnormal   Collection Time: 04/01/17  3:38 PM  Result Value Ref Range   Glucose 92 65 - 99 mg/dL   BUN 12 6 - 24 mg/dL   Creatinine, Ser 0.67 0.57 - 1.00 mg/dL   GFR calc non Af Amer 106 >59 mL/min/1.73   GFR calc Af Amer 123 >59 mL/min/1.73   BUN/Creatinine Ratio 18 9 - 23   Sodium 141 134 - 144 mmol/L   Potassium 3.9 3.5 - 5.2 mmol/L   Chloride 101 96 - 106 mmol/L   CO2 24 20 - 29 mmol/L    Comment:               **Please note reference interval change**   Calcium 9.7 8.7 - 10.2 mg/dL   Total Protein 6.6 6.0 - 8.5 g/dL   Albumin 4.1 3.5 - 5.5 g/dL   Globulin, Total 2.5 1.5 - 4.5 g/dL   Albumin/Globulin Ratio 1.6 1.2 - 2.2   Bilirubin Total 0.5 0.0 - 1.2 mg/dL   Alkaline Phosphatase 84 39 - 117 IU/L   AST 25 0 - 40 IU/L   ALT 37 (H) 0 - 32 IU/L  25-Hydroxyvitamin D Lcms D2+D3     Status: Abnormal   Collection Time: 04/01/17  3:38 PM  Result Value Ref Range   25-Hydroxy,  Vitamin D 20 (L) ng/mL    Comment: Reference Range: All Ages: Target levels 30 - 100    25-Hydroxy, Vitamin D-2 5.4 ng/mL   25-Hydroxy, Vitamin D-3 15 ng/mL  Sedimentation rate     Status: None   Collection Time: 04/01/17  3:38 PM  Result Value Ref Range   Sed Rate 11 0 - 32 mm/hr  Vitamin B12     Status: None   Collection Time: 04/01/17  3:38 PM  Result Value Ref Range   Vitamin B-12 606 232 - 1,245 pg/mL  Magnesium     Status: None   Collection Time: 04/01/17  3:38 PM  Result Value Ref Range   Magnesium 2.1 1.6 - 2.3 mg/dL  C-reactive protein     Status: Abnormal   Collection Time: 04/01/17  3:38 PM  Result Value Ref Range   CRP 10.5 (H) 0.0 - 4.9 mg/L  Basic metabolic panel     Status: Abnormal   Collection Time: 04/15/17  4:50 PM  Result Value Ref Range   Sodium 138 135 - 145 mmol/L   Potassium 4.5 3.5 - 5.1 mmol/L   Chloride 100 (L) 101 - 111 mmol/L   CO2 29 22 - 32 mmol/L   Glucose, Bld 112 (H) 65 - 99 mg/dL   BUN 18 6 - 20 mg/dL   Creatinine, Ser 1.13 (H) 0.44 - 1.00 mg/dL   Calcium 9.9 8.9 - 10.3 mg/dL  GFR calc non Af Amer 58 (L) >60 mL/min   GFR calc Af Amer >60 >60 mL/min    Comment: (NOTE) The eGFR has been calculated using the CKD EPI equation. This calculation has not been validated in all clinical situations. eGFR's persistently <60 mL/min signify possible Chronic Kidney Disease.    Anion gap 9 5 - 15  CBC     Status: Abnormal   Collection Time: 04/15/17  4:50 PM  Result Value Ref Range   WBC 11.3 (H) 3.6 - 11.0 K/uL   RBC 4.48 3.80 - 5.20 MIL/uL   Hemoglobin 14.5 12.0 - 16.0 g/dL   HCT 42.8 35.0 - 47.0 %   MCV 95.6 80.0 - 100.0 fL   MCH 32.3 26.0 - 34.0 pg   MCHC 33.8 32.0 - 36.0 g/dL   RDW 14.2 11.5 - 14.5 %   Platelets 233 150 - 440 K/uL  Troponin I     Status: None   Collection Time: 04/15/17  4:50 PM  Result Value Ref Range   Troponin I <0.03 <0.03 ng/mL  Troponin I     Status: None   Collection Time: 04/15/17  7:48 PM  Result Value Ref  Range   Troponin I <0.03 <0.03 ng/mL  Lipid panel     Status: Abnormal   Collection Time: 05/02/17  8:29 AM  Result Value Ref Range   Cholesterol 264 (H) <200 mg/dL   Triglycerides 263 (H) <150 mg/dL   HDL 47 (L) >50 mg/dL   Total CHOL/HDL Ratio 5.6 (H) <5.0 Ratio   VLDL 53 (H) <30 mg/dL   LDL Cholesterol 164 (H) <100 mg/dL  CBC with Differential/Platelet     Status: Abnormal   Collection Time: 05/02/17  8:30 AM  Result Value Ref Range   WBC 6.2 3.8 - 10.8 K/uL   RBC 4.28 3.80 - 5.10 MIL/uL   Hemoglobin 14.2 11.7 - 15.5 g/dL   HCT 41.3 35.0 - 45.0 %   MCV 96.5 80.0 - 100.0 fL   MCH 33.2 (H) 27.0 - 33.0 pg   MCHC 34.4 32.0 - 36.0 g/dL   RDW 13.4 11.0 - 15.0 %   Platelets 206 140 - 400 K/uL   MPV 9.3 7.5 - 12.5 fL   Neutro Abs 4,030 1,500 - 7,800 cells/uL   Lymphs Abs 1,674 850 - 3,900 cells/uL   Monocytes Absolute 372 200 - 950 cells/uL   Eosinophils Absolute 124 15 - 500 cells/uL   Basophils Absolute 0 0 - 200 cells/uL   Neutrophils Relative % 65 %   Lymphocytes Relative 27 %   Monocytes Relative 6 %   Eosinophils Relative 2 %   Basophils Relative 0 %   Smear Review Criteria for review not met   D-dimer, quantitative (not at [facility])     Status: Abnormal   Collection Time: 05/02/17  8:30 AM  Result Value Ref Range   D-Dimer, Quant 0.57 (H) <0.50 mcg/mL FEU    Comment:   The D-Dimer test is used frequently to exclude an acute PE or DVT.  In patients with a low to moderate clinical risk assessment and a D-Dimer result <0.50 mcg/mL FEU, the likelihood of a PE or DVT is very low.  However, a thromboembolic event should not be excluded solely on the basis of the D-Dimer level.  Increased levels of D-Dimer are associated with a PE, DVT, DIC, malignancies, inflammation, sepsis, surgery, trauma, pregnancy, and advancing patient age. [[place] 2006 11:295(2): 832-919]   For additional information,  please refer to: http://education.questdiagn[employer].com/faq/FAQ149 (This link is  being provided for information/ educational purposes only)     TSH     Status: None   Collection Time: 05/02/17  8:30 AM  Result Value Ref Range   TSH 2.63 mIU/L    Comment:   Reference Range   > or = 20 Years  0.40-4.50   Pregnancy Range First trimester  0.26-2.66 Second trimester 0.55-2.73 Third trimester  0.43-2.91     C-reactive protein     Status: None   Collection Time: 05/02/17  8:30 AM  Result Value Ref Range   CRP 4.5 <8.0 mg/L  Brain natriuretic peptide     Status: None   Collection Time: 05/02/17  8:30 AM  Result Value Ref Range   Brain Natriuretic Peptide 14.2 <100 pg/mL    Comment:   BNP levels increase with age in the general population with the highest values seen in individuals greater than 88 years of age. Reference: Joellyn Rued Cardiol 2002; 01:751-02.     Basic metabolic panel     Status: Abnormal   Collection Time: 05/02/17  5:45 PM  Result Value Ref Range   Sodium 141 135 - 145 mmol/L   Potassium 3.6 3.5 - 5.1 mmol/L   Chloride 105 101 - 111 mmol/L   CO2 27 22 - 32 mmol/L   Glucose, Bld 106 (H) 65 - 99 mg/dL   BUN 13 6 - 20 mg/dL   Creatinine, Ser 0.69 0.44 - 1.00 mg/dL   Calcium 9.6 8.9 - 10.3 mg/dL   GFR calc non Af Amer >60 >60 mL/min   GFR calc Af Amer >60 >60 mL/min    Comment: (NOTE) The eGFR has been calculated using the CKD EPI equation. This calculation has not been validated in all clinical situations. eGFR's persistently <60 mL/min signify possible Chronic Kidney Disease.    Anion gap 9 5 - 15  CBC     Status: None   Collection Time: 05/02/17  5:45 PM  Result Value Ref Range   WBC 7.3 3.6 - 11.0 K/uL   RBC 4.33 3.80 - 5.20 MIL/uL   Hemoglobin 14.4 12.0 - 16.0 g/dL   HCT 42.0 35.0 - 47.0 %   MCV 97.0 80.0 - 100.0 fL   MCH 33.2 26.0 - 34.0 pg   MCHC 34.2 32.0 - 36.0 g/dL   RDW 13.4 11.5 - 14.5 %   Platelets 214 150 - 440 K/uL  Troponin I     Status: None   Collection Time: 05/02/17  5:45 PM  Result Value Ref Range    Troponin I <0.03 <0.03 ng/mL     Assessment & Plan  1. Irritant contact dermatitis due to other agents - predniSONE (DELTASONE) 20 MG tablet; Take 1 tablet (20 mg total) by mouth daily with breakfast. Take 19m (1 tablet) on Sunday 05/01/2017 and 05/02/2017, then take 132m(1/2 tab) on Tuesday 05/03/2017.  Dispense: 3 tablet; Refill: 0 - Discussed ongoing erythema and significant itching along with risk of rebound from 5-day course of prednisone, it is agreed that we will prolong treatment by 3 additional days to provide a taper. - hydrOXYzine (ATARAX/VISTARIL) 50 MG tablet; Take 1 tablet (50 mg total) by mouth every 8 (eight) hours as needed.  Dispense: 30 tablet; Refill: 0 - Takes Xanax - not daily, only takes if she absolutely needs to; discussed sedating effect that hydroxyzine can have, and patient agrees to avoid Xanax if taking this medication.  2. Cellulitis of  left lower extremity Complete Doxycycline, keep area clean and dry.  -Red flags and when to present for emergency care or RTC including fever >101.61F, increased erythema or edema, red streaking up the leg, chest pain, shortness of breath, new/worsening/un-resolving symptoms, reviewed with patient at time of visit. Follow up and care instructions discussed and provided in AVS.  ------------------------------------------ I have reviewed this encounter including the documentation in this note and/or discussed this patient with the Johney Maine, FNP, NP-C. I am certifying that I agree with the content of this note as supervising physician.  Enid Derry, [facility] 05/30/2017, 3:51 PM

## 2017-06-03 ENCOUNTER — Ambulatory Visit (INDEPENDENT_AMBULATORY_CARE_PROVIDER_SITE_OTHER): Payer: Medicaid Other | Admitting: Family Medicine

## 2017-06-03 ENCOUNTER — Encounter: Payer: Self-pay | Admitting: Family Medicine

## 2017-06-03 VITALS — BP 120/82 | HR 91 | Temp 98.5°F | Resp 16 | Ht 63.0 in | Wt 219.7 lb

## 2017-06-03 DIAGNOSIS — B372 Candidiasis of skin and nail: Secondary | ICD-10-CM | POA: Diagnosis not present

## 2017-06-03 DIAGNOSIS — L2489 Irritant contact dermatitis due to other agents: Secondary | ICD-10-CM

## 2017-06-03 DIAGNOSIS — L03116 Cellulitis of left lower limb: Secondary | ICD-10-CM | POA: Diagnosis not present

## 2017-06-03 MED ORDER — NYSTATIN 100000 UNIT/GM EX POWD: Freq: Three times a day (TID) | CUTANEOUS | 0 refills | Status: DC

## 2017-06-03 MED ORDER — TRIAMCINOLONE ACETONIDE 0.1 % EX CREA: 1.0000 "application " | TOPICAL_CREAM | Freq: Two times a day (BID) | CUTANEOUS | 0 refills | Status: DC

## 2017-06-03 NOTE — Progress Notes (Addendum)
Name: Dana Bradley   MRN: [id number]    DOB: 1970/12/25   Date:06/03/2017       Progress Note  Subjective  Chief Complaint  Chief Complaint  Patient presents with  . Wound Check  . Rash    on abdomen for 6 days    HPI  Patient presents for wound check and with new rash under LEFT breasts and waistband.  LLE Wound: She completed prednisone today and finished doxycyline yesterday.  She has been doing well over all but is still very itchy. Has been applying calamine lotion with minimal relief. Taking hydroxyzine and this helps a bit.  Still having occasional weeping from this area - all clear drainage.  Rash: Under Left breast and along anterior waist x5 days.  Redness present; describes as very itchy.  Has had yeast infections in these areas in the past and says this feels the same.  Has been applying calamine lotion with little to no relief.  Patient Active Problem List   Diagnosis Date Noted  . Positive D-dimer 05/02/2017  . Lumbar L1-2 disc protrusion (Right) 04/07/2017  . Acute postoperative pain 04/07/2017  . Muscle spasticity 04/01/2017  . 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 Lumbar spondylosis 01/06/2017  . Chronic left hip pain 12/24/2016  . Chronic sacroiliac joint pain (Left) 12/24/2016  . Lumbar facet joint syndrome (B) (L>R) 12/24/2016  . Left lumbar radiculitis 12/24/2016  . Hypertriglyceridemia 11/27/2016  . Intractable headache 09/09/2016  . Medication monitoring encounter 08/29/2016  . Controlled substance agreement signed 08/28/2016  . Plantar fasciitis of left foot 08/28/2016  . Vitamin B12 deficiency 08/28/2016  . Hyperlipidemia 08/28/2016  . Nephrolithiasis 08/12/2016  . Chronic pain syndrome 08/07/2016  . Long term prescription opiate use 08/07/2016  . Opiate use 08/07/2016  . Long term prescription benzodiazepine use 08/07/2016  . Neurogenic pain 08/07/2016  . Chronic low back pain (Location of Primary Source of Pain) (Bilateral) (R>L) (midline)  08/07/2016  . Chronic upper back pain (Location of Secondary source of pain) (Bilateral) (L>R) 08/07/2016  . Chronic abdominal pain (Right lower quadrant) 08/07/2016  . Thoracic radiculitis (Right) (T11 dermatome) 08/07/2016  . Chronic occipital neuralgia (Location of Tertiary source of pain) (Bilateral) (L>R) 08/07/2016  . Chronic neck pain 08/07/2016  . Chronic cervical radicular pain (Bilateral) (L>R) 08/07/2016  . Chronic shoulder blade pain (Bilateral) (L>R) 08/07/2016  . Chronic upper extremity pain (Bilateral) (R>L) 08/07/2016  . Chronic knee pain (Bilateral) (R>L) 08/07/2016  . Chronic ankle pain (Bilateral) 08/07/2016  . Cervical spondylosis with myelopathy and radiculopathy 08/07/2016  . Panic disorder with agoraphobia 05/29/2016  . Depression, unspecified depression type 05/29/2016  . Atypical lymphocytosis 05/01/2016  . Vitamin D insufficiency 05/01/2016  . Chronic lower extremity cramps (Bilateral) 04/29/2016  . Obesity 04/29/2016  . GAD (generalized anxiety disorder) 04/29/2016  . Fatigue 04/29/2016  . Insomnia 07/12/2015  . Migraine without aura and with status migrainosus, not intractable 07/12/2015  . Chronic superficial gastritis 06/02/2015  . Chronic pain of multiple joints 05/15/2015  . Bilateral leg edema 05/02/2015  . Paroxysmal supraventricular tachycardia ([place]) 04/17/2015  . Bright red rectal bleeding 04/06/2015  . DDD (degenerative disc disease), lumbosacral 01/24/2014  . Degeneration of intervertebral disc of lumbosacral region 01/24/2014  . Cervico-occipital neuralgia 12/29/2013  . Fibromyalgia 12/29/2013  . Migraine headache 12/29/2013  . Menorrhagia 12/10/2012  . Depression, major, recurrent, in remission ([place]) 01/12/2009  . Hypertension, benign essential, goal below 140/90 06/23/2008  . History of PSVT (paroxysmal supraventricular tachycardia) 06/17/2008  .  Obstructive sleep apnea, adult 06/17/2008  . GERD 06/13/2008    Social History  Substance  Use Topics  . Smoking status: Former Smoker    Packs/day: 4.00    Years: 3.00    Types: Cigarettes    Quit date: 12/10/1992  . Smokeless tobacco: Never Used  . Alcohol use No     Current Outpatient Prescriptions:  .  acetaminophen (TYLENOL) 325 MG tablet, Take 650 mg by mouth daily. , Disp: , Rfl:  .  ALPRAZolam (XANAX) 1 MG tablet, Take 1 tablet (1 mg total) by mouth at bedtime as needed for anxiety., Disp: 90 tablet, Rfl: 1 .  atorvastatin (LIPITOR) 10 MG tablet, Take 1 tablet (10 mg total) by mouth at bedtime., Disp: 30 tablet, Rfl: 1 .  baclofen (LIORESAL) 10 MG tablet, Take 1 tablet (10 mg total) by mouth 3 (three) times daily. (Patient taking differently: Take 10 mg by mouth as needed. ), Disp: 90 tablet, Rfl: 2 .  Calcium-Vitamin D-Vitamin K 376-283-15 MG-UNT-MCG TABS, Take 1 tablet by mouth daily. , Disp: , Rfl:  .  Cyanocobalamin (VITAMIN B-12 PO), Take 1,500 mcg by mouth daily., Disp: , Rfl:  .  diclofenac sodium (VOLTAREN) 1 % GEL, Apply 2 g topically 4 (four) times daily., Disp: 1 Tube, Rfl: 2 .  hydrOXYzine (ATARAX/VISTARIL) 50 MG tablet, Take 1 tablet (50 mg total) by mouth every 8 (eight) hours as needed., Disp: 30 tablet, Rfl: 0 .  lamoTRIgine (LAMICTAL) 100 MG tablet, Take 100 mg by mouth 2 (two) times daily., Disp: , Rfl:  .  magnesium 30 MG tablet, Take 30 mg by mouth daily. , Disp: , Rfl:  .  metoprolol (LOPRESSOR) 50 MG tablet, Take 1 tablet (50 mg total) by mouth 2 (two) times daily., Disp: 180 tablet, Rfl: 3 .  omega-3 acid ethyl esters (LOVAZA) 1 g capsule, Take 2 capsules (2 g total) by mouth 2 (two) times daily., Disp: 120 capsule, Rfl: 11 .  pregabalin (LYRICA) 150 MG capsule, Take 1 capsule (150 mg total) by mouth every 8 (eight) hours., Disp: 90 capsule, Rfl: 2 .  Valerian 100 MG CAPS, Take 100 mg by mouth as needed., Disp: , Rfl:  .  doxycycline (VIBRA-TABS) 100 MG tablet, Take 1 tablet (100 mg total) by mouth 2 (two) times daily. (Patient not taking: Reported  on 06/03/2017), Disp: 14 tablet, Rfl: 0 .  HYDROcodone-acetaminophen (NORCO) 7.5-325 MG tablet, Take 1 tablet by mouth every 6 (six) hours as needed for moderate pain., Disp: 30 tablet, Rfl: 0  Allergies  Allergen Reactions  . Aspirin Swelling  . Cymbalta [Duloxetine Hcl] Other (See Comments)    Suicidal ideations and has homicidal thoughts per patient  . Demerol [Meperidine] Nausea And Vomiting    Patient projectile vomits and usually result in ER  . Depakote [Divalproex Sodium] Shortness Of Breath    W/ n/v  . Haloperidol Shortness Of Breath    W/ n/v  . Iodinated Diagnostic Agents Other (See Comments)  . Reglan [Metoclopramide] Shortness Of Breath    Can't breath, wheezes  . Tramadol Hcl Palpitations    Severely and adversely affects her SVT giving her tachycardias of 150-160 bpm.  . Trazodone Shortness Of Breath  . Compazine [Prochlorperazine] Other (See Comments)    Panic attack  . Meloxicam Other (See Comments)    mouth sores, tingling, blisters in mouth  . Penicillins Rash  . Tomato Hives    Tongue will blister  . Shellfish-Derived Products Other (See Comments)  .  Bacitracin-Neomycin-Polymyxin Rash  . Cephalosporins Rash    rash  . Ibuprofen Other (See Comments) and Rash    Blisters in mouth. Blisters in mouth.  . Latex Itching  . Neosporin [Neomycin-Bacitracin Zn-Polymyx] Rash  . Nsaids Other (See Comments)    Blisters in mouth; can take 1 ibuprofen 2x a month  . Sulfonamide Derivatives Rash    ROS Constitutional: Negative for fever or weight change.  Respiratory: Negative for cough and shortness of breath.   Cardiovascular: Negative for chest pain or palpitations.  Gastrointestinal: Negative for abdominal pain, no bowel changes.  Musculoskeletal: Negative for gait problem or joint swelling.  Skin: See HPI Neurological: Negative for dizziness or headache.  No other specific complaints in a complete review of systems (except as listed in HPI  above).  Objective  Vitals:   06/03/17 0835  BP: 120/82  Pulse: 91  Resp: 16  Temp: 98.5 F (36.9 C)  TempSrc: Oral  SpO2: 97%  Weight: 219 lb 11.2 oz (99.7 kg)  Height: '5\' 3"'$  (1.6 m)   Body mass index is 38.92 kg/m.  Nursing Note and Vital Signs reviewed.  Physical Exam  Constitutional: Patient appears well-developed and well-nourished. Obese No distress.  HEENT: head atraumatic, normocephalic Cardiovascular: Normal rate, regular rhythm, S1/S2 present.  No murmur or rub heard. No BLE edema. Pulmonary/Chest: Effort normal and breath sounds clear. No respiratory distress or retractions. Psychiatric: Patient has a normal mood and affect. behavior is normal. Judgment and thought content normal. Skin: Much improved erythema, minimal edema, and small amount of serous drainage to anterior LEFT shin and upper ankle. Negative for tenderness where erythema is present. Appears to have improved since last visit- no longer circumferential.  Area under left breast and along anterior waist are erythematous with small amount of white flaking; excoriation present - no bleeding or discharge.   Recent Results (from the past 2160 hour(s))  ToxASSURE Select 13 (MW), Urine     Status: None   Collection Time: 04/01/17  3:36 PM  Result Value Ref Range   Summary FINAL     Comment: ==================================================================== TOXASSURE SELECT 13 (MW) ==================================================================== Test                             Result       Flag       Units Drug Present and Declared for Prescription Verification   Hydrocodone                    366          EXPECTED   ng/mg creat   Hydromorphone                  121          EXPECTED   ng/mg creat   Dihydrocodeine                 34           EXPECTED   ng/mg creat   Norhydrocodone                 295          EXPECTED   ng/mg creat    Sources of hydrocodone include scheduled prescription     medications. Hydromorphone, dihydrocodeine and norhydrocodone are    expected metabolites of hydrocodone. Hydromorphone and    dihydrocodeine are also available as scheduled prescription    medications. Drug  Absent but Declared for Prescription Verification   Alprazolam                     Not Detected UNEXPECTED ng/mg creat ======================================= ============================= Test                      Result    Flag   Units      Ref Range   Creatinine              174              mg/dL      >=20 ==================================================================== Declared Medications:  The flagging and interpretation on this report are based on the  following declared medications.  Unexpected results may arise from  inaccuracies in the declared medications.  **Note: The testing scope of this panel includes these medications:  Alprazolam (Xanax)  Hydrocodone (Norco)  **Note: The testing scope of this panel does not include following  reported medications:  Acetaminophen (Norco)  Amitriptyline  Atorvastatin  Baclofen (Lioresal)  Calcium  Cyanocobalamin  Diclofenac  Lamotrigine  Magnesium  Metoprolol (Lopressor)  Omega-3 Fatty Acids (Lovaza)  Pregabalin (Lyrica)  Vitamin D  Vitamin K ==================================================================== For clinical consultation, please call [phone]. ====================================================================   Comprehensive metabolic panel     Status: Abnormal   Collection Time: 04/01/17  3:38 PM  Result Value Ref Range   Glucose 92 65 - 99 mg/dL   BUN 12 6 - 24 mg/dL   Creatinine, Ser 0.67 0.57 - 1.00 mg/dL   GFR calc non Af Amer 106 >59 mL/min/1.73   GFR calc Af Amer 123 >59 mL/min/1.73   BUN/Creatinine Ratio 18 9 - 23   Sodium 141 134 - 144 mmol/L   Potassium 3.9 3.5 - 5.2 mmol/L   Chloride 101 96 - 106 mmol/L   CO2 24 20 - 29 mmol/L    Comment:               **Please note  reference interval change**   Calcium 9.7 8.7 - 10.2 mg/dL   Total Protein 6.6 6.0 - 8.5 g/dL   Albumin 4.1 3.5 - 5.5 g/dL   Globulin, Total 2.5 1.5 - 4.5 g/dL   Albumin/Globulin Ratio 1.6 1.2 - 2.2   Bilirubin Total 0.5 0.0 - 1.2 mg/dL   Alkaline Phosphatase 84 39 - 117 IU/L   AST 25 0 - 40 IU/L   ALT 37 (H) 0 - 32 IU/L  25-Hydroxyvitamin D Lcms D2+D3     Status: Abnormal   Collection Time: 04/01/17  3:38 PM  Result Value Ref Range   25-Hydroxy, Vitamin D 20 (L) ng/mL    Comment: Reference Range: All Ages: Target levels 30 - 100    25-Hydroxy, Vitamin D-2 5.4 ng/mL   25-Hydroxy, Vitamin D-3 15 ng/mL  Sedimentation rate     Status: None   Collection Time: 04/01/17  3:38 PM  Result Value Ref Range   Sed Rate 11 0 - 32 mm/hr  Vitamin B12     Status: None   Collection Time: 04/01/17  3:38 PM  Result Value Ref Range   Vitamin B-12 606 232 - 1,245 pg/mL  Magnesium     Status: None   Collection Time: 04/01/17  3:38 PM  Result Value Ref Range   Magnesium 2.1 1.6 - 2.3 mg/dL  C-reactive protein     Status: Abnormal   Collection Time: 04/01/17  3:38 PM  Result Value  Ref Range   CRP 10.5 (H) 0.0 - 4.9 mg/L  Basic metabolic panel     Status: Abnormal   Collection Time: 04/15/17  4:50 PM  Result Value Ref Range   Sodium 138 135 - 145 mmol/L   Potassium 4.5 3.5 - 5.1 mmol/L   Chloride 100 (L) 101 - 111 mmol/L   CO2 29 22 - 32 mmol/L   Glucose, Bld 112 (H) 65 - 99 mg/dL   BUN 18 6 - 20 mg/dL   Creatinine, Ser 1.13 (H) 0.44 - 1.00 mg/dL   Calcium 9.9 8.9 - 10.3 mg/dL   GFR calc non Af Amer 58 (L) >60 mL/min   GFR calc Af Amer >60 >60 mL/min    Comment: (NOTE) The eGFR has been calculated using the CKD EPI equation. This calculation has not been validated in all clinical situations. eGFR's persistently <60 mL/min signify possible Chronic Kidney Disease.    Anion gap 9 5 - 15  CBC     Status: Abnormal   Collection Time: 04/15/17  4:50 PM  Result Value Ref Range   WBC 11.3 (H)  3.6 - 11.0 K/uL   RBC 4.48 3.80 - 5.20 MIL/uL   Hemoglobin 14.5 12.0 - 16.0 g/dL   HCT 42.8 35.0 - 47.0 %   MCV 95.6 80.0 - 100.0 fL   MCH 32.3 26.0 - 34.0 pg   MCHC 33.8 32.0 - 36.0 g/dL   RDW 14.2 11.5 - 14.5 %   Platelets 233 150 - 440 K/uL  Troponin I     Status: None   Collection Time: 04/15/17  4:50 PM  Result Value Ref Range   Troponin I <0.03 <0.03 ng/mL  Troponin I     Status: None   Collection Time: 04/15/17  7:48 PM  Result Value Ref Range   Troponin I <0.03 <0.03 ng/mL  Lipid panel     Status: Abnormal   Collection Time: 05/02/17  8:29 AM  Result Value Ref Range   Cholesterol 264 (H) <200 mg/dL   Triglycerides 263 (H) <150 mg/dL   HDL 47 (L) >50 mg/dL   Total CHOL/HDL Ratio 5.6 (H) <5.0 Ratio   VLDL 53 (H) <30 mg/dL   LDL Cholesterol 164 (H) <100 mg/dL  CBC with Differential/Platelet     Status: Abnormal   Collection Time: 05/02/17  8:30 AM  Result Value Ref Range   WBC 6.2 3.8 - 10.8 K/uL   RBC 4.28 3.80 - 5.10 MIL/uL   Hemoglobin 14.2 11.7 - 15.5 g/dL   HCT 41.3 35.0 - 45.0 %   MCV 96.5 80.0 - 100.0 fL   MCH 33.2 (H) 27.0 - 33.0 pg   MCHC 34.4 32.0 - 36.0 g/dL   RDW 13.4 11.0 - 15.0 %   Platelets 206 140 - 400 K/uL   MPV 9.3 7.5 - 12.5 fL   Neutro Abs 4,030 1,500 - 7,800 cells/uL   Lymphs Abs 1,674 850 - 3,900 cells/uL   Monocytes Absolute 372 200 - 950 cells/uL   Eosinophils Absolute 124 15 - 500 cells/uL   Basophils Absolute 0 0 - 200 cells/uL   Neutrophils Relative % 65 %   Lymphocytes Relative 27 %   Monocytes Relative 6 %   Eosinophils Relative 2 %   Basophils Relative 0 %   Smear Review Criteria for review not met   D-dimer, quantitative (not at [facility])     Status: Abnormal   Collection Time: 05/02/17  8:30 AM  Result Value Ref  Range   D-Dimer, Quant 0.57 (H) <0.50 mcg/mL FEU    Comment:   The D-Dimer test is used frequently to exclude an acute PE or DVT.  In patients with a low to moderate clinical risk assessment and a D-Dimer result <0.50  mcg/mL FEU, the likelihood of a PE or DVT is very low.  However, a thromboembolic event should not be excluded solely on the basis of the D-Dimer level.  Increased levels of D-Dimer are associated with a PE, DVT, DIC, malignancies, inflammation, sepsis, surgery, trauma, pregnancy, and advancing patient age. [[place]ama 2006 11:295(2): 233-007]   For additional information, please refer to: http://education.[facility].com/faq/FAQ149 (This link is being provided for information/ educational purposes only)     TSH     Status: None   Collection Time: 05/02/17  8:30 AM  Result Value Ref Range   TSH 2.63 mIU/L    Comment:   Reference Range   > or = 20 Years  0.40-4.50   Pregnancy Range First trimester  0.26-2.66 Second trimester 0.55-2.73 Third trimester  0.43-2.91     C-reactive protein     Status: None   Collection Time: 05/02/17  8:30 AM  Result Value Ref Range   CRP 4.5 <8.0 mg/L  Brain natriuretic peptide     Status: None   Collection Time: 05/02/17  8:30 AM  Result Value Ref Range   Brain Natriuretic Peptide 14.2 <100 pg/mL    Comment:   BNP levels increase with age in the general population with the highest values seen in individuals greater than 29 years of age. Reference: Joellyn Rued Cardiol 2002; 62:263-33.     Basic metabolic panel     Status: Abnormal   Collection Time: 05/02/17  5:45 PM  Result Value Ref Range   Sodium 141 135 - 145 mmol/L   Potassium 3.6 3.5 - 5.1 mmol/L   Chloride 105 101 - 111 mmol/L   CO2 27 22 - 32 mmol/L   Glucose, Bld 106 (H) 65 - 99 mg/dL   BUN 13 6 - 20 mg/dL   Creatinine, Ser 0.69 0.44 - 1.00 mg/dL   Calcium 9.6 8.9 - 10.3 mg/dL   GFR calc non Af Amer >60 >60 mL/min   GFR calc Af Amer >60 >60 mL/min    Comment: (NOTE) The eGFR has been calculated using the CKD EPI equation. This calculation has not been validated in all clinical situations. eGFR's persistently <60 mL/min signify possible Chronic Kidney Disease.    Anion  gap 9 5 - 15  CBC     Status: None   Collection Time: 05/02/17  5:45 PM  Result Value Ref Range   WBC 7.3 3.6 - 11.0 K/uL   RBC 4.33 3.80 - 5.20 MIL/uL   Hemoglobin 14.4 12.0 - 16.0 g/dL   HCT 42.0 35.0 - 47.0 %   MCV 97.0 80.0 - 100.0 fL   MCH 33.2 26.0 - 34.0 pg   MCHC 34.2 32.0 - 36.0 g/dL   RDW 13.4 11.5 - 14.5 %   Platelets 214 150 - 440 K/uL  Troponin I     Status: None   Collection Time: 05/02/17  5:45 PM  Result Value Ref Range   Troponin I <0.03 <0.03 ng/mL     Assessment & Plan  1. Irritant contact dermatitis due to other agents - triamcinolone cream (KENALOG) 0.1 %; Apply 1 application topically 2 (two) times daily. Do not apply to open skin.  Dispense: 30 g; Refill: 0  2.  Cellulitis of left lower extremity Completed Doxycycline - Discussed red flags of infection including red streaking, increased redness or swelling.  3. Candidal intertrigo - nystatin (MYCOSTATIN/NYSTOP) powder; Apply topically 3 (three) times daily. Apply to affected areas  Dispense: 15 g; Refill: 0  -Red flags and when to present for emergency care or RTC including fever >101.80F, chest pain, shortness of breath, calf pain, red streaking, edema, new/worsening/un-resolving symptoms, reviewed with patient at time of visit. Follow up and care instructions discussed and provided in AVS.  I have reviewed this encounter including the documentation in this note and/or discussed this patient with the Johney Maine, FNP, NP-C. I am certifying that I agree with the content of this note as supervising physician.  Steele Sizer, MD [facility] [employer] [facility] 06/03/2017, 1:14 PM

## 2017-06-09 ENCOUNTER — Encounter: Payer: Self-pay | Admitting: Family Medicine

## 2017-06-09 ENCOUNTER — Ambulatory Visit (INDEPENDENT_AMBULATORY_CARE_PROVIDER_SITE_OTHER): Payer: Medicaid Other | Admitting: Family Medicine

## 2017-06-09 VITALS — BP 128/76 | HR 85 | Temp 98.1°F | Resp 16 | Ht 63.0 in | Wt 218.9 lb

## 2017-06-09 DIAGNOSIS — L2489 Irritant contact dermatitis due to other agents: Secondary | ICD-10-CM

## 2017-06-09 DIAGNOSIS — L03116 Cellulitis of left lower limb: Secondary | ICD-10-CM

## 2017-06-09 DIAGNOSIS — S81802D Unspecified open wound, left lower leg, subsequent encounter: Secondary | ICD-10-CM

## 2017-06-09 DIAGNOSIS — B354 Tinea corporis: Secondary | ICD-10-CM

## 2017-06-09 MED ORDER — KETOCONAZOLE 2 % EX CREA: 1.0000 "application " | TOPICAL_CREAM | Freq: Every day | CUTANEOUS | 0 refills | Status: DC

## 2017-06-09 MED ORDER — FLUCONAZOLE 150 MG PO TABS: 150.0000 mg | ORAL_TABLET | Freq: Once | ORAL | 0 refills | Status: AC

## 2017-06-09 NOTE — Progress Notes (Addendum)
Name: Dana Bradley   MRN: [id number]    DOB: Jun 25, 1971   Date:06/09/2017       Progress Note  Subjective  Chief Complaint  Chief Complaint  Patient presents with  . Rash    more on left leg than right  . Follow-up    leg wound left, smells, no better    HPI  Rash: Has been applying coconut oil and nystatin powder to areas under breast and on lower abdomen; this has not been helping, and has now spread to the lateral LEFT breast, along anterior lower abdomen and LEFT thigh reaching to about mid-thigh, also present on RIGHT thigh in a small patch and RIGHT medial knee. Wound: LLE had contact dermatitis that has now turned into a wound on the anterior shin.  Over the weekend wound had a foul odor, odor now is very mild and pt only having scant amount of drainage. Area has started to scab, but has continued erythema and now a maculopapular rash on the anterior shin.  Patient Active Problem List   Diagnosis Date Noted  . Positive D-dimer 05/02/2017  . Lumbar L1-2 disc protrusion (Right) 04/07/2017  . Acute postoperative pain 04/07/2017  . Muscle spasticity 04/01/2017  . 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 Lumbar spondylosis 01/06/2017  . Chronic left hip pain 12/24/2016  . Chronic sacroiliac joint pain (Left) 12/24/2016  . Lumbar facet joint syndrome (B) (L>R) 12/24/2016  . Left lumbar radiculitis 12/24/2016  . Hypertriglyceridemia 11/27/2016  . Intractable headache 09/09/2016  . Medication monitoring encounter 08/29/2016  . Controlled substance agreement signed 08/28/2016  . Plantar fasciitis of left foot 08/28/2016  . Vitamin B12 deficiency 08/28/2016  . Hyperlipidemia 08/28/2016  . Nephrolithiasis 08/12/2016  . Chronic pain syndrome 08/07/2016  . Long term prescription opiate use 08/07/2016  . Opiate use 08/07/2016  . Long term prescription benzodiazepine use 08/07/2016  . Neurogenic pain 08/07/2016  . Chronic low back pain (Location of Primary Source of Pain)  (Bilateral) (R>L) (midline) 08/07/2016  . Chronic upper back pain (Location of Secondary source of pain) (Bilateral) (L>R) 08/07/2016  . Chronic abdominal pain (Right lower quadrant) 08/07/2016  . Thoracic radiculitis (Right) (T11 dermatome) 08/07/2016  . Chronic occipital neuralgia (Location of Tertiary source of pain) (Bilateral) (L>R) 08/07/2016  . Chronic neck pain 08/07/2016  . Chronic cervical radicular pain (Bilateral) (L>R) 08/07/2016  . Chronic shoulder blade pain (Bilateral) (L>R) 08/07/2016  . Chronic upper extremity pain (Bilateral) (R>L) 08/07/2016  . Chronic knee pain (Bilateral) (R>L) 08/07/2016  . Chronic ankle pain (Bilateral) 08/07/2016  . Cervical spondylosis with myelopathy and radiculopathy 08/07/2016  . Panic disorder with agoraphobia 05/29/2016  . Depression, unspecified depression type 05/29/2016  . Atypical lymphocytosis 05/01/2016  . Vitamin D insufficiency 05/01/2016  . Chronic lower extremity cramps (Bilateral) 04/29/2016  . Obesity 04/29/2016  . GAD (generalized anxiety disorder) 04/29/2016  . Fatigue 04/29/2016  . Insomnia 07/12/2015  . Migraine without aura and with status migrainosus, not intractable 07/12/2015  . Chronic superficial gastritis 06/02/2015  . Chronic pain of multiple joints 05/15/2015  . Bilateral leg edema 05/02/2015  . Paroxysmal supraventricular tachycardia ([place]) 04/17/2015  . Bright red rectal bleeding 04/06/2015  . DDD (degenerative disc disease), lumbosacral 01/24/2014  . Degeneration of intervertebral disc of lumbosacral region 01/24/2014  . Cervico-occipital neuralgia 12/29/2013  . Fibromyalgia 12/29/2013  . Migraine headache 12/29/2013  . Menorrhagia 12/10/2012  . Depression, major, recurrent, in remission ([place]) 01/12/2009  . Hypertension, benign essential, goal below 140/90 06/23/2008  .  History of PSVT (paroxysmal supraventricular tachycardia) 06/17/2008  . Obstructive sleep apnea, adult 06/17/2008  . GERD 06/13/2008     Social History  Substance Use Topics  . Smoking status: Former Smoker    Packs/day: 4.00    Years: 3.00    Types: Cigarettes    Quit date: 12/10/1992  . Smokeless tobacco: Never Used  . Alcohol use No     Current Outpatient Prescriptions:  .  acetaminophen (TYLENOL) 325 MG tablet, Take 650 mg by mouth daily. , Disp: , Rfl:  .  ALPRAZolam (XANAX) 1 MG tablet, Take 1 tablet (1 mg total) by mouth at bedtime as needed for anxiety., Disp: 90 tablet, Rfl: 1 .  atorvastatin (LIPITOR) 10 MG tablet, Take 1 tablet (10 mg total) by mouth at bedtime., Disp: 30 tablet, Rfl: 1 .  baclofen (LIORESAL) 10 MG tablet, Take 1 tablet (10 mg total) by mouth 3 (three) times daily. (Patient taking differently: Take 10 mg by mouth as needed. ), Disp: 90 tablet, Rfl: 2 .  Calcium-Vitamin D-Vitamin K 750-500-40 MG-UNT-MCG TABS, Take 1 tablet by mouth daily. , Disp: , Rfl:  .  Cyanocobalamin (VITAMIN B-12 PO), Take 1,500 mcg by mouth daily., Disp: , Rfl:  .  diclofenac sodium (VOLTAREN) 1 % GEL, Apply 2 g topically 4 (four) times daily., Disp: 1 Tube, Rfl: 2 .  hydrOXYzine (ATARAX/VISTARIL) 50 MG tablet, Take 1 tablet (50 mg total) by mouth every 8 (eight) hours as needed., Disp: 30 tablet, Rfl: 0 .  lamoTRIgine (LAMICTAL) 100 MG tablet, Take 100 mg by mouth 2 (two) times daily., Disp: , Rfl:  .  magnesium 30 MG tablet, Take 30 mg by mouth daily. , Disp: , Rfl:  .  metoprolol (LOPRESSOR) 50 MG tablet, Take 1 tablet (50 mg total) by mouth 2 (two) times daily., Disp: 180 tablet, Rfl: 3 .  nystatin (MYCOSTATIN/NYSTOP) powder, Apply topically 3 (three) times daily. Apply to affected areas, Disp: 15 g, Rfl: 0 .  omega-3 acid ethyl esters (LOVAZA) 1 g capsule, Take 2 capsules (2 g total) by mouth 2 (two) times daily., Disp: 120 capsule, Rfl: 11 .  pregabalin (LYRICA) 150 MG capsule, Take 1 capsule (150 mg total) by mouth every 8 (eight) hours., Disp: 90 capsule, Rfl: 2 .  triamcinolone cream (KENALOG) 0.1 %, Apply 1  application topically 2 (two) times daily. Do not apply to open skin., Disp: 30 g, Rfl: 0 .  Valerian 100 MG CAPS, Take 100 mg by mouth as needed., Disp: , Rfl:  .  HYDROcodone-acetaminophen (NORCO) 7.5-325 MG tablet, Take 1 tablet by mouth every 6 (six) hours as needed for moderate pain., Disp: 30 tablet, Rfl: 0  Allergies  Allergen Reactions  . Aspirin Swelling  . Cymbalta [Duloxetine Hcl] Other (See Comments)    Suicidal ideations and has homicidal thoughts per patient  . Demerol [Meperidine] Nausea And Vomiting    Patient projectile vomits and usually result in ER  . Depakote [Divalproex Sodium] Shortness Of Breath    W/ n/v  . Haloperidol Shortness Of Breath    W/ n/v  . Iodinated Diagnostic Agents Other (See Comments)  . Reglan [Metoclopramide] Shortness Of Breath    Can't breath, wheezes  . Tramadol Hcl Palpitations    Severely and adversely affects her SVT giving her tachycardias of 150-160 bpm.  . Trazodone Shortness Of Breath  . Compazine [Prochlorperazine] Other (See Comments)    Panic attack  . Meloxicam Other (See Comments)    mouth sores, tingling,  blisters in mouth  . Penicillins Rash  . Tomato Hives    Tongue will blister  . Shellfish-Derived Products Other (See Comments)  . Bacitracin-Neomycin-Polymyxin Rash  . Cephalosporins Rash    rash  . Ibuprofen Other (See Comments) and Rash    Blisters in mouth. Blisters in mouth.  . Latex Itching  . Neosporin [Neomycin-Bacitracin Zn-Polymyx] Rash  . Nsaids Other (See Comments)    Blisters in mouth; can take 1 ibuprofen 2x a month  . Sulfonamide Derivatives Rash    ROS  Constitutional: Negative for fever or weight change.  Respiratory: Negative for cough and shortness of breath.   Cardiovascular: Negative for chest pain or palpitations.  Gastrointestinal: Negative for abdominal pain, no bowel changes.  Musculoskeletal: Negative for gait problem or joint swelling.  Skin: Negative for rash.  Neurological:  Negative for dizziness or headache.  No other specific complaints in a complete review of systems (except as listed in HPI above).  Objective  Vitals:   06/09/17 1314  BP: 128/76  Pulse: 85  Resp: 16  Temp: 98.1 F (36.7 C)  TempSrc: Oral  SpO2: 98%  Weight: 218 lb 14.4 oz (99.3 kg)  Height: 5' 3" (1.6 m)   Body mass index is 38.78 kg/m.  Nursing Note and Vital Signs reviewed.  Physical Exam  Constitutional: Patient appears well-developed and well-nourished. Obese.  No distress.  HEENT: head atraumatic, normocephalic Cardiovascular: Normal rate, regular rhythm, S1/S2 present.  No murmur or rub heard. No BLE edema. Pulmonary/Chest: Effort normal and breath sounds clear. No respiratory distress or retractions. Psychiatric: Patient has a normal mood and affect. behavior is normal. Judgment and thought content normal. Skin: Stable appearance of erythema, minimal edema, and small amount of serous drainage with scabbing and crustingto anteriorLEFT shin and upper ankle. Negative for tenderness where erythema is present. No posterior calf pain.   Erythematous macular rash - no longer presenting with white flaking, and has spread - currently present under LEFT breast and on lower abdomen up to umbilicus, to the lateral LEFT breast, along anterior lower abdomen and LEFT thigh reaching to about mid-thigh, also present on RIGHT thigh in a small patch and RIGHT medial knee.  Recent Results (from the past 2160 hour(s))  ToxASSURE Select 13 (MW), Urine     Status: None   Collection Time: 04/01/17  3:36 PM  Result Value Ref Range   Summary FINAL     Comment: ==================================================================== TOXASSURE SELECT 13 (MW) ==================================================================== Test                             Result       Flag       Units Drug Present and Declared for Prescription Verification   Hydrocodone                    366          EXPECTED    ng/mg creat   Hydromorphone                  121          EXPECTED   ng/mg creat   Dihydrocodeine                 34           EXPECTED   ng/mg creat   Norhydrocodone  295          EXPECTED   ng/mg creat    Sources of hydrocodone include scheduled prescription    medications. Hydromorphone, dihydrocodeine and norhydrocodone are    expected metabolites of hydrocodone. Hydromorphone and    dihydrocodeine are also available as scheduled prescription    medications. Drug Absent but Declared for Prescription Verification   Alprazolam                     Not Detected UNEXPECTED ng/mg creat ======================================= ============================= Test                      Result    Flag   Units      Ref Range   Creatinine              174              mg/dL      >=20 ==================================================================== Declared Medications:  The flagging and interpretation on this report are based on the  following declared medications.  Unexpected results may arise from  inaccuracies in the declared medications.  **Note: The testing scope of this panel includes these medications:  Alprazolam (Xanax)  Hydrocodone (Norco)  **Note: The testing scope of this panel does not include following  reported medications:  Acetaminophen (Norco)  Amitriptyline  Atorvastatin  Baclofen (Lioresal)  Calcium  Cyanocobalamin  Diclofenac  Lamotrigine  Magnesium  Metoprolol (Lopressor)  Omega-3 Fatty Acids (Lovaza)  Pregabalin (Lyrica)  Vitamin D  Vitamin K ==================================================================== For clinical consultation, please call [phone]. ====================================================================   Comprehensive metabolic panel     Status: Abnormal   Collection Time: 04/01/17  3:38 PM  Result Value Ref Range   Glucose 92 65 - 99 mg/dL   BUN 12 6 - 24 mg/dL   Creatinine, Ser 0.67 0.57 - 1.00 mg/dL    GFR calc non Af Amer 106 >59 mL/min/1.73   GFR calc Af Amer 123 >59 mL/min/1.73   BUN/Creatinine Ratio 18 9 - 23   Sodium 141 134 - 144 mmol/L   Potassium 3.9 3.5 - 5.2 mmol/L   Chloride 101 96 - 106 mmol/L   CO2 24 20 - 29 mmol/L    Comment:               **Please note reference interval change**   Calcium 9.7 8.7 - 10.2 mg/dL   Total Protein 6.6 6.0 - 8.5 g/dL   Albumin 4.1 3.5 - 5.5 g/dL   Globulin, Total 2.5 1.5 - 4.5 g/dL   Albumin/Globulin Ratio 1.6 1.2 - 2.2   Bilirubin Total 0.5 0.0 - 1.2 mg/dL   Alkaline Phosphatase 84 39 - 117 IU/L   AST 25 0 - 40 IU/L   ALT 37 (H) 0 - 32 IU/L  25-Hydroxyvitamin D Lcms D2+D3     Status: Abnormal   Collection Time: 04/01/17  3:38 PM  Result Value Ref Range   25-Hydroxy, Vitamin D 20 (L) ng/mL    Comment: Reference Range: All Ages: Target levels 30 - 100    25-Hydroxy, Vitamin D-2 5.4 ng/mL   25-Hydroxy, Vitamin D-3 15 ng/mL  Sedimentation rate     Status: None   Collection Time: 04/01/17  3:38 PM  Result Value Ref Range   Sed Rate 11 0 - 32 mm/hr  Vitamin B12     Status: None   Collection Time: 04/01/17  3:38 PM  Result Value Ref Range  Vitamin B-12 606 232 - 1,245 pg/mL  Magnesium     Status: None   Collection Time: 04/01/17  3:38 PM  Result Value Ref Range   Magnesium 2.1 1.6 - 2.3 mg/dL  C-reactive protein     Status: Abnormal   Collection Time: 04/01/17  3:38 PM  Result Value Ref Range   CRP 10.5 (H) 0.0 - 4.9 mg/L  Basic metabolic panel     Status: Abnormal   Collection Time: 04/15/17  4:50 PM  Result Value Ref Range   Sodium 138 135 - 145 mmol/L   Potassium 4.5 3.5 - 5.1 mmol/L   Chloride 100 (L) 101 - 111 mmol/L   CO2 29 22 - 32 mmol/L   Glucose, Bld 112 (H) 65 - 99 mg/dL   BUN 18 6 - 20 mg/dL   Creatinine, Ser 1.13 (H) 0.44 - 1.00 mg/dL   Calcium 9.9 8.9 - 10.3 mg/dL   GFR calc non Af Amer 58 (L) >60 mL/min   GFR calc Af Amer >60 >60 mL/min    Comment: (NOTE) The eGFR has been calculated using the CKD EPI  equation. This calculation has not been validated in all clinical situations. eGFR's persistently <60 mL/min signify possible Chronic Kidney Disease.    Anion gap 9 5 - 15  CBC     Status: Abnormal   Collection Time: 04/15/17  4:50 PM  Result Value Ref Range   WBC 11.3 (H) 3.6 - 11.0 K/uL   RBC 4.48 3.80 - 5.20 MIL/uL   Hemoglobin 14.5 12.0 - 16.0 g/dL   HCT 42.8 35.0 - 47.0 %   MCV 95.6 80.0 - 100.0 fL   MCH 32.3 26.0 - 34.0 pg   MCHC 33.8 32.0 - 36.0 g/dL   RDW 14.2 11.5 - 14.5 %   Platelets 233 150 - 440 K/uL  Troponin I     Status: None   Collection Time: 04/15/17  4:50 PM  Result Value Ref Range   Troponin I <0.03 <0.03 ng/mL  Troponin I     Status: None   Collection Time: 04/15/17  7:48 PM  Result Value Ref Range   Troponin I <0.03 <0.03 ng/mL  Lipid panel     Status: Abnormal   Collection Time: 05/02/17  8:29 AM  Result Value Ref Range   Cholesterol 264 (H) <200 mg/dL   Triglycerides 263 (H) <150 mg/dL   HDL 47 (L) >50 mg/dL   Total CHOL/HDL Ratio 5.6 (H) <5.0 Ratio   VLDL 53 (H) <30 mg/dL   LDL Cholesterol 164 (H) <100 mg/dL  CBC with Differential/Platelet     Status: Abnormal   Collection Time: 05/02/17  8:30 AM  Result Value Ref Range   WBC 6.2 3.8 - 10.8 K/uL   RBC 4.28 3.80 - 5.10 MIL/uL   Hemoglobin 14.2 11.7 - 15.5 g/dL   HCT 41.3 35.0 - 45.0 %   MCV 96.5 80.0 - 100.0 fL   MCH 33.2 (H) 27.0 - 33.0 pg   MCHC 34.4 32.0 - 36.0 g/dL   RDW 13.4 11.0 - 15.0 %   Platelets 206 140 - 400 K/uL   MPV 9.3 7.5 - 12.5 fL   Neutro Abs 4,030 1,500 - 7,800 cells/uL   Lymphs Abs 1,674 850 - 3,900 cells/uL   Monocytes Absolute 372 200 - 950 cells/uL   Eosinophils Absolute 124 15 - 500 cells/uL   Basophils Absolute 0 0 - 200 cells/uL   Neutrophils Relative % 65 %  Lymphocytes Relative 27 %   Monocytes Relative 6 %   Eosinophils Relative 2 %   Basophils Relative 0 %   Smear Review Criteria for review not met   D-dimer, quantitative (not at [facility])     Status:  Abnormal   Collection Time: 05/02/17  8:30 AM  Result Value Ref Range   D-Dimer, Quant 0.57 (H) <0.50 mcg/mL FEU    Comment:   The D-Dimer test is used frequently to exclude an acute PE or DVT.  In patients with a low to moderate clinical risk assessment and a D-Dimer result <0.50 mcg/mL FEU, the likelihood of a PE or DVT is very low.  However, a thromboembolic event should not be excluded solely on the basis of the D-Dimer level.  Increased levels of D-Dimer are associated with a PE, DVT, DIC, malignancies, inflammation, sepsis, surgery, trauma, pregnancy, and advancing patient age. [[place]ama 2006 11:295(2): 726-203]   For additional information, please refer to: http://education.questdiagnostics.com/faq/FAQ149 (This link is being provided for information/ educational purposes only)     TSH     Status: None   Collection Time: 05/02/17  8:30 AM  Result Value Ref Range   TSH 2.63 mIU/L    Comment:   Reference Range   > or = 20 Years  0.40-4.50   Pregnancy Range First trimester  0.26-2.66 Second trimester 0.55-2.73 Third trimester  0.43-2.91     C-reactive protein     Status: None   Collection Time: 05/02/17  8:30 AM  Result Value Ref Range   CRP 4.5 <8.0 mg/L  Brain natriuretic peptide     Status: None   Collection Time: 05/02/17  8:30 AM  Result Value Ref Range   Brain Natriuretic Peptide 14.2 <100 pg/mL    Comment:   BNP levels increase with age in the general population with the highest values seen in individuals greater than 69 years of age. Reference: Joellyn Rued Cardiol 2002; 55:974-16.     Basic metabolic panel     Status: Abnormal   Collection Time: 05/02/17  5:45 PM  Result Value Ref Range   Sodium 141 135 - 145 mmol/L   Potassium 3.6 3.5 - 5.1 mmol/L   Chloride 105 101 - 111 mmol/L   CO2 27 22 - 32 mmol/L   Glucose, Bld 106 (H) 65 - 99 mg/dL   BUN 13 6 - 20 mg/dL   Creatinine, Ser 0.69 0.44 - 1.00 mg/dL   Calcium 9.6 8.9 - 10.3 mg/dL   GFR calc non Af  Amer >60 >60 mL/min   GFR calc Af Amer >60 >60 mL/min    Comment: (NOTE) The eGFR has been calculated using the CKD EPI equation. This calculation has not been validated in all clinical situations. eGFR's persistently <60 mL/min signify possible Chronic Kidney Disease.    Anion gap 9 5 - 15  CBC     Status: None   Collection Time: 05/02/17  5:45 PM  Result Value Ref Range   WBC 7.3 3.6 - 11.0 K/uL   RBC 4.33 3.80 - 5.20 MIL/uL   Hemoglobin 14.4 12.0 - 16.0 g/dL   HCT 42.0 35.0 - 47.0 %   MCV 97.0 80.0 - 100.0 fL   MCH 33.2 26.0 - 34.0 pg   MCHC 34.2 32.0 - 36.0 g/dL   RDW 13.4 11.5 - 14.5 %   Platelets 214 150 - 440 K/uL  Troponin I     Status: None   Collection Time: 05/02/17  5:45 PM  Result Value Ref Range   Troponin I <0.03 <0.03 ng/mL     Assessment & Plan  1. Cellulitis of left lower extremity - Ambulatory referral to Wound Clinic - Wound culture  2. Tinea corporis - ketoconazole (NIZORAL) 2 % cream; Apply 1 application topically daily.  Dispense: 15 g; Refill: 0 - Advised to mix ketoconazole with OTC hydrocortisone for the first few days of treatment to decrease risk of burning/pain on application of cream. - fluconazole (DIFLUCAN) 150 MG tablet; Take 1 tablet (150 mg total) by mouth once.  Dispense: 1 tablet; Refill: 0  3. Wound of left lower extremity, subsequent encounter - Ambulatory referral to Wound Clinic - Wound culture  4. Irritant contact dermatitis due to other agents - Ambulatory referral to Wound Clinic - Wound culture  -Red flags and when to present for emergency care or RTC including fever >101.82F, chest pain, shortness of breath, posterior calf pain new/worsening/un-resolving symptoms, reviewed with patient at time of visit. Follow up and care instructions discussed and provided in AVS.  I have reviewed this encounter including the documentation in this note and/or discussed this patient with the Johney Maine, FNP, NP-C. I am certifying  that I agree with the content of this note as supervising physician.  Steele Sizer, MD [facility] 06/13/2017, 5:16 PM

## 2017-06-10 ENCOUNTER — Telehealth: Payer: Self-pay | Admitting: Family Medicine

## 2017-06-10 DIAGNOSIS — B354 Tinea corporis: Secondary | ICD-10-CM

## 2017-06-10 MED ORDER — KETOCONAZOLE 2 % EX CREA: 1.0000 "application " | TOPICAL_CREAM | Freq: Every day | CUTANEOUS | 0 refills | Status: DC

## 2017-06-10 MED ORDER — FLUCONAZOLE 150 MG PO TABS: 150.0000 mg | ORAL_TABLET | Freq: Once | ORAL | 0 refills | Status: AC

## 2017-06-10 NOTE — Telephone Encounter (Signed)
Spoke with patient today - took Diflucan last night, has not had improvement in rash as of yet. Rash is in a large surface area, so she is also requesting additional refill of Ketoconazole prior to impending severe weather.  I advised that I will refill Ketoconazole in a larger quantity as well as provide 1 additional Diflucan - she is to wait 72 hours after her first dose to take this second dose if not improving. Pt agreeable.

## 2017-06-13 ENCOUNTER — Other Ambulatory Visit: Payer: Self-pay | Admitting: Family Medicine

## 2017-06-13 LAB — WOUND CULTURE
MICRO NUMBER: 80993650
SPECIMEN QUALITY: ADEQUATE

## 2017-06-13 MED ORDER — CIPROFLOXACIN HCL 250 MG PO TABS: 250.0000 mg | ORAL_TABLET | Freq: Two times a day (BID) | ORAL | 0 refills | Status: DC

## 2017-06-23 ENCOUNTER — Ambulatory Visit: Payer: Self-pay | Admitting: Surgery

## 2017-06-26 ENCOUNTER — Other Ambulatory Visit: Payer: Self-pay | Admitting: Family Medicine

## 2017-06-26 DIAGNOSIS — I10 Essential (primary) hypertension: Secondary | ICD-10-CM

## 2017-07-02 ENCOUNTER — Ambulatory Visit: Payer: BLUE CROSS/BLUE SHIELD | Admitting: Nurse Practitioner

## 2017-07-02 ENCOUNTER — Other Ambulatory Visit: Payer: Self-pay | Admitting: Pain Medicine

## 2017-07-02 DIAGNOSIS — M797 Fibromyalgia: Secondary | ICD-10-CM

## 2017-07-10 ENCOUNTER — Telehealth: Payer: Self-pay | Admitting: *Deleted

## 2017-07-15 ENCOUNTER — Ambulatory Visit: Payer: Medicaid Other | Admitting: Nurse Practitioner

## 2017-07-16 ENCOUNTER — Ambulatory Visit: Payer: Medicaid Other | Admitting: Nurse Practitioner

## 2017-07-17 ENCOUNTER — Ambulatory Visit: Payer: Medicaid Other | Attending: Nurse Practitioner | Admitting: Nurse Practitioner

## 2017-07-17 ENCOUNTER — Encounter: Payer: Self-pay | Admitting: Nurse Practitioner

## 2017-07-17 ENCOUNTER — Ambulatory Visit: Payer: Medicaid Other | Attending: Neurology

## 2017-07-17 VITALS — BP 115/76 | HR 73 | Temp 98.3°F | Resp 18 | Ht 63.0 in | Wt 218.0 lb

## 2017-07-17 DIAGNOSIS — Z5181 Encounter for therapeutic drug level monitoring: Secondary | ICD-10-CM | POA: Diagnosis not present

## 2017-07-17 DIAGNOSIS — M19011 Primary osteoarthritis, right shoulder: Secondary | ICD-10-CM | POA: Insufficient documentation

## 2017-07-17 DIAGNOSIS — I471 Supraventricular tachycardia: Secondary | ICD-10-CM | POA: Diagnosis not present

## 2017-07-17 DIAGNOSIS — G8929 Other chronic pain: Secondary | ICD-10-CM | POA: Diagnosis not present

## 2017-07-17 DIAGNOSIS — E538 Deficiency of other specified B group vitamins: Secondary | ICD-10-CM | POA: Insufficient documentation

## 2017-07-17 DIAGNOSIS — K219 Gastro-esophageal reflux disease without esophagitis: Secondary | ICD-10-CM | POA: Insufficient documentation

## 2017-07-17 DIAGNOSIS — M4712 Other spondylosis with myelopathy, cervical region: Secondary | ICD-10-CM | POA: Insufficient documentation

## 2017-07-17 DIAGNOSIS — M5116 Intervertebral disc disorders with radiculopathy, lumbar region: Secondary | ICD-10-CM | POA: Insufficient documentation

## 2017-07-17 DIAGNOSIS — Z6838 Body mass index (BMI) 38.0-38.9, adult: Secondary | ICD-10-CM | POA: Diagnosis not present

## 2017-07-17 DIAGNOSIS — M25552 Pain in left hip: Secondary | ICD-10-CM | POA: Insufficient documentation

## 2017-07-17 DIAGNOSIS — M5412 Radiculopathy, cervical region: Secondary | ICD-10-CM | POA: Diagnosis not present

## 2017-07-17 DIAGNOSIS — G4733 Obstructive sleep apnea (adult) (pediatric): Secondary | ICD-10-CM | POA: Diagnosis not present

## 2017-07-17 DIAGNOSIS — F411 Generalized anxiety disorder: Secondary | ICD-10-CM | POA: Diagnosis not present

## 2017-07-17 DIAGNOSIS — Z87891 Personal history of nicotine dependence: Secondary | ICD-10-CM | POA: Diagnosis not present

## 2017-07-17 DIAGNOSIS — Z880 Allergy status to penicillin: Secondary | ICD-10-CM | POA: Insufficient documentation

## 2017-07-17 DIAGNOSIS — N200 Calculus of kidney: Secondary | ICD-10-CM | POA: Insufficient documentation

## 2017-07-17 DIAGNOSIS — M5442 Lumbago with sciatica, left side: Secondary | ICD-10-CM

## 2017-07-17 DIAGNOSIS — M797 Fibromyalgia: Secondary | ICD-10-CM

## 2017-07-17 DIAGNOSIS — I10 Essential (primary) hypertension: Secondary | ICD-10-CM | POA: Diagnosis not present

## 2017-07-17 DIAGNOSIS — R0683 Snoring: Secondary | ICD-10-CM | POA: Diagnosis not present

## 2017-07-17 DIAGNOSIS — Z886 Allergy status to analgesic agent status: Secondary | ICD-10-CM | POA: Insufficient documentation

## 2017-07-17 DIAGNOSIS — E669 Obesity, unspecified: Secondary | ICD-10-CM | POA: Insufficient documentation

## 2017-07-17 DIAGNOSIS — G894 Chronic pain syndrome: Secondary | ICD-10-CM | POA: Insufficient documentation

## 2017-07-17 DIAGNOSIS — K589 Irritable bowel syndrome without diarrhea: Secondary | ICD-10-CM | POA: Insufficient documentation

## 2017-07-17 DIAGNOSIS — E559 Vitamin D deficiency, unspecified: Secondary | ICD-10-CM | POA: Diagnosis not present

## 2017-07-17 DIAGNOSIS — R51 Headache: Secondary | ICD-10-CM | POA: Insufficient documentation

## 2017-07-17 DIAGNOSIS — M5137 Other intervertebral disc degeneration, lumbosacral region: Secondary | ICD-10-CM | POA: Diagnosis not present

## 2017-07-17 DIAGNOSIS — M5441 Lumbago with sciatica, right side: Secondary | ICD-10-CM

## 2017-07-17 DIAGNOSIS — E781 Pure hyperglyceridemia: Secondary | ICD-10-CM | POA: Insufficient documentation

## 2017-07-17 DIAGNOSIS — K2930 Chronic superficial gastritis without bleeding: Secondary | ICD-10-CM | POA: Diagnosis not present

## 2017-07-17 DIAGNOSIS — M5481 Occipital neuralgia: Secondary | ICD-10-CM | POA: Diagnosis not present

## 2017-07-17 DIAGNOSIS — M25571 Pain in right ankle and joints of right foot: Secondary | ICD-10-CM | POA: Insufficient documentation

## 2017-07-17 DIAGNOSIS — E785 Hyperlipidemia, unspecified: Secondary | ICD-10-CM | POA: Insufficient documentation

## 2017-07-17 DIAGNOSIS — M25562 Pain in left knee: Secondary | ICD-10-CM | POA: Insufficient documentation

## 2017-07-17 DIAGNOSIS — M25572 Pain in left ankle and joints of left foot: Secondary | ICD-10-CM | POA: Insufficient documentation

## 2017-07-17 DIAGNOSIS — R1031 Right lower quadrant pain: Secondary | ICD-10-CM | POA: Insufficient documentation

## 2017-07-17 DIAGNOSIS — F3340 Major depressive disorder, recurrent, in remission, unspecified: Secondary | ICD-10-CM | POA: Diagnosis not present

## 2017-07-17 DIAGNOSIS — Z79899 Other long term (current) drug therapy: Secondary | ICD-10-CM | POA: Insufficient documentation

## 2017-07-17 DIAGNOSIS — F4001 Agoraphobia with panic disorder: Secondary | ICD-10-CM | POA: Insufficient documentation

## 2017-07-17 DIAGNOSIS — M19012 Primary osteoarthritis, left shoulder: Secondary | ICD-10-CM | POA: Insufficient documentation

## 2017-07-17 MED ORDER — PREGABALIN 150 MG PO CAPS: 150.0000 mg | ORAL_CAPSULE | Freq: Three times a day (TID) | ORAL | 2 refills | Status: DC

## 2017-07-17 NOTE — Progress Notes (Signed)
Nursing Pain Medication Assessment:  Safety precautions to be maintained throughout the outpatient stay will include: orient to surroundings, keep bed in low position, maintain call bell within reach at all times, provide assistance with transfer out of bed and ambulation.  Medication Inspection Compliance: Dana Bradley did not comply with our request to bring her pills to be counted. She was reminded that bringing the medication bottles, even when empty, is a requirement.  Medication: None brought in. Pill/Patch Count: None available to be counted. Bottle Appearance: No container available. Did not bring bottle(s) to appointment. Filled Date: N/A Last Medication intake:  September

## 2017-07-17 NOTE — Progress Notes (Signed)
Patient's Name: Dana Bradley  MRN: [id number]  Referring Provider: Arnetha Courser, MD  DOB: 1971/05/17  PCP: Arnetha Courser, MD  DOS: 07/17/2017  Note by: Vevelyn Francois NP  Service setting: Ambulatory outpatient  Specialty: Interventional Pain Management  Location: [facility] (AMB) Pain Management Facility    Patient type: Established    Primary Reason(s) for Visit: Encounter for prescription drug management & post-procedure evaluation of chronic illness with mild to moderate exacerbation(Level of risk: moderate) CC: Back Pain (low and tailbone pain) and Groin Pain (left)  HPI  Dana Bradley is a 46 y.o. year old, female patient, who comes today for a post-procedure evaluation and medication management. She has Depression, major, recurrent, in remission ([place]); Hypertension, benign essential, goal below 140/90; History of PSVT (paroxysmal supraventricular tachycardia); GERD; Obstructive sleep apnea, adult; Menorrhagia; Cervico-occipital neuralgia; DDD (degenerative disc disease), lumbosacral; Paroxysmal supraventricular tachycardia ([place]); Chronic pain of multiple joints; Chronic superficial gastritis; Degeneration of intervertebral disc of lumbosacral region; Fibromyalgia; Insomnia; Migraine without aura and with status migrainosus, not intractable; Chronic lower extremity cramps (Bilateral); Obesity; GAD (generalized anxiety disorder); Fatigue; Atypical lymphocytosis; Vitamin D insufficiency; Panic disorder with agoraphobia; Depression, unspecified depression type; Chronic pain syndrome; Long term prescription opiate use; Opiate use; Long term prescription benzodiazepine use; Neurogenic pain; Chronic low back pain (Location of Primary Source of Pain) (Bilateral) (R>L) (midline); Chronic upper back pain (Location of Secondary source of pain) (Bilateral) (L>R); Chronic abdominal pain (Right lower quadrant); Thoracic radiculitis (Right) (T11 dermatome); Chronic occipital neuralgia (Location of Tertiary source of  pain) (Bilateral) (L>R); Chronic neck pain; Chronic cervical radicular pain (Bilateral) (L>R); Chronic shoulder blade pain (Bilateral) (L>R); Chronic upper extremity pain (Bilateral) (R>L); Chronic knee pain (Bilateral) (R>L); Chronic ankle pain (Bilateral); Cervical spondylosis with myelopathy and radiculopathy; Nephrolithiasis; Controlled substance agreement signed; Plantar fasciitis of left foot; Vitamin B12 deficiency; Hyperlipidemia; Medication monitoring encounter; Intractable headache; Bilateral leg edema; Bright red rectal bleeding; Hypertriglyceridemia; Chronic left hip pain; Chronic sacroiliac joint pain (Left); Lumbar facet joint syndrome (B) (L>R); Left lumbar radiculitis; Lumbar spondylosis; Migraine headache; Muscle spasticity; Osteoarthritis of shoulder (B); Lumbar L1-2 disc protrusion (Right); Acute postoperative pain; and Positive D-dimer on her problem list. Her primarily concern today is the Back Pain (low and tailbone pain) and Groin Pain (left)  Pain Assessment: Location:     Radiating:   Onset:   Duration:   Quality:   Severity: 2 /10 (self-reported pain score)  Note: Reported level is compatible with observation.                    Effect on ADL:   Timing:   Modifying factors:    Dana Bradley was last seen on Visit date not found for a procedure. During today's appointment we reviewed Ms. Leisure's post-procedure results, as well as her outpatient medication regimen. She has constant pain. She state that the neck pain is the worse. She states that this causes headaches pain. She has daily migraines. She is having numbness and tingling in arms. She is haivng shoulder and  tailbone pain with lying in bed. She admits that she is so tired. She has untreated depression. She has multiple allergies. She admits that she has had occipital nerve block which are not longer effective. She denies any past Botox injections.   Further details on both, my assessment(s), as well as the proposed  treatment plan, please see below.  Controlled Substance Pharmacotherapy Assessment REMS (Risk Evaluation and Mitigation Strategy)  Analgesic:Hydrocodone/APAP 7.5/325 one daily MME/day:7.68m/day.  Hart Rochester, RN  07/17/2017 11:03 AM  Sign at close encounter Nursing Pain Medication Assessment:  Safety precautions to be maintained throughout the outpatient stay will include: orient to surroundings, keep bed in low position, maintain call bell within reach at all times, provide assistance with transfer out of bed and ambulation.  Medication Inspection Compliance: [name] did not comply with our request to bring her pills to be counted. She was reminded that bringing the medication bottles, even when empty, is a requirement.  Medication: None brought in. Pill/Patch Count: None available to be counted. Bottle Appearance: No container available. Did not bring bottle(s) to appointment. Filled Date: N/A Last Medication intake:  September   Pharmacokinetics: Liberation and absorption (onset of action): WNL Distribution (time to peak effect): WNL Metabolism and excretion (duration of action): WNL         Pharmacodynamics: Desired effects: Analgesia: [name] reports >50% benefit. Functional ability: Patient reports that medication allows her to accomplish basic ADLs Clinically meaningful improvement in function (CMIF): Sustained CMIF goals met Perceived effectiveness: Described as relatively effective, allowing for increase in activities of daily living (ADL) Undesirable effects: Side-effects or Adverse reactions: None reported Monitoring: [place] PMP: Online review of the past 38-monthperiod conducted. Compliant with practice rules and regulations Last UDS on record: Summary  Date Value Ref Range Status  04/01/2017 FINAL  Final    Comment:    ==================================================================== TOXASSURE SELECT 13  (MW) ==================================================================== Test                             Result       Flag       Units Drug Present and Declared for Prescription Verification   Hydrocodone                    366          EXPECTED   ng/mg creat   Hydromorphone                  121          EXPECTED   ng/mg creat   Dihydrocodeine                 34           EXPECTED   ng/mg creat   Norhydrocodone                 295          EXPECTED   ng/mg creat    Sources of hydrocodone include scheduled prescription    medications. Hydromorphone, dihydrocodeine and norhydrocodone are    expected metabolites of hydrocodone. Hydromorphone and    dihydrocodeine are also available as scheduled prescription    medications. Drug Absent but Declared for Prescription Verification   Alprazolam                     Not Detected UNEXPECTED ng/mg creat ==================================================================== Test                      Result    Flag   Units      Ref Range   Creatinine              174              mg/dL      >=20 ==================================================================== Declared Medications:  The flagging and  interpretation on this report are based on the  following declared medications.  Unexpected results may arise from  inaccuracies in the declared medications.  **Note: The testing scope of this panel includes these medications:  Alprazolam (Xanax)  Hydrocodone (Norco)  **Note: The testing scope of this panel does not include following  reported medications:  Acetaminophen (Norco)  Amitriptyline  Atorvastatin  Baclofen (Lioresal)  Calcium  Cyanocobalamin  Diclofenac  Lamotrigine  Magnesium  Metoprolol (Lopressor)  Omega-3 Fatty Acids (Lovaza)  Pregabalin (Lyrica)  Vitamin D  Vitamin K ==================================================================== For clinical consultation, please call [phone]. ====================================================================    UDS interpretation: Compliant          Medication Assessment Form: Reviewed. Patient indicates being compliant with therapy Treatment compliance: Compliant Risk Assessment Profile: Aberrant behavior: See prior evaluations. None observed or detected today Comorbid factors increasing risk of overdose: See prior notes. No additional risks detected today Risk of substance use disorder (SUD): Low     Opioid Risk Tool - 07/17/17 1109      Family History of Substance Abuse   Alcohol Positive Female   Illegal Drugs Negative   Rx Drugs Negative     Personal History of Substance Abuse   Alcohol Negative   Illegal Drugs Negative   Rx Drugs Negative     Age   Age between 15-45 years  No     History of Preadolescent Sexual Abuse   History of Preadolescent Sexual Abuse Negative or Female     Psychological Disease   Psychological Disease Positive   Depression Positive  anxiety     Total Score   Opioid Risk Tool Scoring 4   Opioid Risk Interpretation Moderate Risk     ORT Scoring interpretation table:  Score <3 = Low Risk for SUD  Score between 4-7 = Moderate Risk for SUD  Score >8 = High Risk for Opioid Abuse   Risk Mitigation Strategies:  Patient Counseling: Covered Patient-Prescriber Agreement (PPA): Present and active  Notification to other healthcare providers: Done  Pharmacologic Plan: No change in therapy, at this time  Post-Procedure Assessment  Visit date not found Procedure: 04/07/17 Left LESI Pre-procedure pain score:  1/10 Post-procedure pain score: 2/10         Influential Factors: BMI: 38.62 kg/m Intra-procedural challenges: None observed.         Assessment challenges: None detected.              Reported side-effects: None.        Post-procedural adverse reactions or complications: None reported         Sedation: Please see nurses note. When no sedatives are used, the  analgesic levels obtained are directly associated to the effectiveness of the local anesthetics. However, when sedation is provided, the level of analgesia obtained during the initial 1 hour following the intervention, is believed to be the result of a combination of factors. These factors may include, but are not limited to: 1. The effectiveness of the local anesthetics used. 2. The effects of the analgesic(s) and/or anxiolytic(s) used. 3. The degree of discomfort experienced by the patient at the time of the procedure. 4. The patients ability and reliability in recalling and recording the events. 5. The presence and influence of possible secondary gains and/or psychosocial factors. Reported result: Relief experienced during the 1st hour after the procedure: 0 % (Ultra-Short Term Relief)            Interpretative annotation: Clinically appropriate result. Analgesia during this  period is likely to be Local Anesthetic and/or IV Sedative (Analgesic/Anxiolytic) related.          Effects of local anesthetic: The analgesic effects attained during this period are directly associated to the localized infiltration of local anesthetics and therefore cary significant diagnostic value as to the etiological location, or anatomical origin, of the pain. Expected duration of relief is directly dependent on the pharmacodynamics of the local anesthetic used. Long-acting (4-6 hours) anesthetics used.  Reported result: Relief during the next 4 to 6 hour after the procedure: 0 % (Short-Term Relief)            Interpretative annotation: Clinically appropriate result. Analgesia during this period is likely to be Local Anesthetic-related.          Long-term benefit: Defined as the period of time past the expected duration of local anesthetics (1 hour for short-acting and 4-6 hours for long-acting). With the possible exception of prolonged sympathetic blockade from the local anesthetics, benefits during this period are  typically attributed to, or associated with, other factors such as analgesic sensory neuropraxia, antiinflammatory effects, or beneficial biochemical changes provided by agents other than the local anesthetics.  Reported result: Extended relief following procedure: 50 % (Long-Term Relief)            Interpretative annotation: Clinically appropriate result. Good relief. No permanent benefit expected. Inflammation plays a part in the etiology to the pain.          Current benefits: Defined as reported results that persistent at this point in time.   Analgesia: <50 %            Function: No benefit ROM: No benefit Interpretative annotation: Recurrence of symptoms. No permanent benefit expected. Effective diagnostic intervention.          Interpretation: Results would suggest a successful diagnostic intervention.                  Plan:  Please see "Plan of Care" for details.       Laboratory Chemistry  Inflammation Markers (CRP: Acute Phase) (ESR: Chronic Phase) Lab Results  Component Value Date   CRP 4.5 05/02/2017   ESRSEDRATE 11 04/01/2017                 Renal Function Markers Lab Results  Component Value Date   BUN 13 05/02/2017   CREATININE 0.69 05/02/2017   GFRAA >60 05/02/2017   GFRNONAA >60 05/02/2017                 Hepatic Function Markers Lab Results  Component Value Date   AST 25 04/01/2017   ALT 37 (H) 04/01/2017   ALBUMIN 4.1 04/01/2017   ALKPHOS 84 04/01/2017                 Electrolytes Lab Results  Component Value Date   NA 141 05/02/2017   K 3.6 05/02/2017   CL 105 05/02/2017   CALCIUM 9.6 05/02/2017   MG 2.1 04/01/2017                 Neuropathy Markers Lab Results  Component Value Date   VITAMINB12 606 04/01/2017                 Bone Pathology Markers Lab Results  Component Value Date   ALKPHOS 84 04/01/2017   VD25OH 17 (L) 04/29/2016   25OHVITD1 20 (L) 04/01/2017   25OHVITD2 5.4 04/01/2017   25OHVITD3 15 04/01/2017   CALCIUM 9.6  05/02/2017                 Coagulation Parameters Lab Results  Component Value Date   INR 1.01 04/23/2010   LABPROT 13.2 04/23/2010   PLT 214 05/02/2017                 Cardiovascular Markers Lab Results  Component Value Date   BNP 14.2 05/02/2017   HGB 14.4 05/02/2017   HCT 42.0 05/02/2017                 Note: Lab results reviewed.  Recent Diagnostic Imaging Results  NM PULMONARY VENT AND PERF (V/Q Scan) CLINICAL DATA:  46 year old female with positive D-dimer.  EXAM: NUCLEAR MEDICINE VENTILATION - PERFUSION LUNG SCAN  TECHNIQUE: Ventilation images were obtained in multiple projections using inhaled aerosol Tc-75mDTPA. Perfusion images were obtained in multiple projections after intravenous injection of Tc-922mAA.  RADIOPHARMACEUTICALS:  37.48 mCi Technetium-9962mPA aerosol inhalation and 4.09 mCi Technetium-73m24m IV  COMPARISON:  Chest radiograph dated 05/02/2017  FINDINGS: Ventilation: No focal ventilation defect.  Perfusion: No wedge shaped peripheral perfusion defects to suggest acute pulmonary embolism.  IMPRESSION: Normal study.  Electronically Signed   By: ArasAnner Crete.   On: 05/03/2017 00:14  Complexity Note: Imaging results reviewed. Results shared with Ms. PaynScobeying Layman's terms.                         Meds   Current Outpatient Prescriptions:  .  acetaminophen (TYLENOL) 325 MG tablet, Take 650 mg by mouth daily. , Disp: , Rfl:  .  ALPRAZolam (XANAX) 1 MG tablet, Take 1 tablet (1 mg total) by mouth at bedtime as needed for anxiety., Disp: 90 tablet, Rfl: 1 .  atorvastatin (LIPITOR) 10 MG tablet, Take 1 tablet (10 mg total) by mouth at bedtime., Disp: 30 tablet, Rfl: 1 .  baclofen (LIORESAL) 10 MG tablet, Take 1 tablet (10 mg total) by mouth 3 (three) times daily. (Patient taking differently: Take 10 mg by mouth as needed. ), Disp: 90 tablet, Rfl: 2 .  Calcium-Vitamin D-Vitamin K 750-194-174-08UNT-MCG TABS, Take 1 tablet by  mouth daily. , Disp: , Rfl:  .  Cyanocobalamin (VITAMIN B-12 PO), Take 1,500 mcg by mouth daily., Disp: , Rfl:  .  diclofenac sodium (VOLTAREN) 1 % GEL, Apply 2 g topically 4 (four) times daily., Disp: 1 Tube, Rfl: 2 .  HYDROcodone-acetaminophen (NORCO) 7.5-325 MG tablet, Take 1 tablet by mouth every 6 (six) hours as needed for moderate pain., Disp: 30 tablet, Rfl: 0 .  hydrOXYzine (ATARAX/VISTARIL) 50 MG tablet, Take 1 tablet (50 mg total) by mouth every 8 (eight) hours as needed., Disp: 30 tablet, Rfl: 0 .  magnesium 30 MG tablet, Take 300 mg by mouth daily. , Disp: , Rfl:  .  metoprolol tartrate (LOPRESSOR) 50 MG tablet, TAKE 1 TABLET BY MOUTH TWICE A DAY, Disp: 180 tablet, Rfl: 3 .  omega-3 acid ethyl esters (LOVAZA) 1 g capsule, Take 2 capsules (2 g total) by mouth 2 (two) times daily., Disp: 120 capsule, Rfl: 11 .  pregabalin (LYRICA) 150 MG capsule, Take 1 capsule (150 mg total) by mouth every 8 (eight) hours., Disp: 90 capsule, Rfl: 2 .  Valerian 100 MG CAPS, Take 100 mg by mouth as needed., Disp: , Rfl:   ROS  Constitutional: Denies any fever or chills Gastrointestinal: No reported hemesis, hematochezia, vomiting, or acute GI distress Musculoskeletal: Denies any  acute onset joint swelling, redness, loss of ROM, or weakness Neurological: No reported episodes of acute onset apraxia, aphasia, dysarthria, agnosia, amnesia, paralysis, loss of coordination, or loss of consciousness  Allergies  Ms. Samson is allergic to aspirin; cymbalta [duloxetine hcl]; demerol [meperidine]; depakote [divalproex sodium]; haloperidol; iodinated diagnostic agents; reglan [metoclopramide]; tramadol hcl; trazodone; compazine [prochlorperazine]; meloxicam; penicillins; tomato; shellfish-derived products; bacitracin-neomycin-polymyxin; cephalosporins; ibuprofen; latex; neosporin [neomycin-bacitracin zn-polymyx]; nsaids; and sulfonamide derivatives.  PFSH  Drug: Ms. Vary  reports that she does not use  drugs. Alcohol:  reports that she does not drink alcohol. Tobacco:  reports that she quit smoking about 24 years ago. Her smoking use included Cigarettes. She has a 12.00 pack-year smoking history. She has never used smokeless tobacco. Medical:  has a past medical history of Bursitis; Edema leg (05/02/2015); Fibromyalgia; GERD (gastroesophageal reflux disease); IBS (irritable bowel syndrome); Knee pain, bilateral (12/21/2008); Lumbar discitis; Osteoarthritis; Right hand pain (04/10/2015); Sleep apnea; SVT (supraventricular tachycardia) ([place]); Vertigo; and Vitamin D deficiency (05/01/2016). Surgical: Ms. Mulcahey  has a past surgical history that includes Ablation; Tubal ligation (10/01/99); Cardiac catheterization; Knee arthroscopy; Colonoscopy with propofol (N/A, 05/17/2015); Esophagogastroduodenoscopy (N/A, 05/17/2015); and spg. Family: family history includes Alcohol abuse in her father; Alzheimer's disease in her paternal grandmother and unknown relative; Aneurysm in her maternal grandfather; Arthritis in her maternal grandmother; Bipolar disorder in her sister; COPD in her paternal grandfather; Cancer in her mother; Cancer (age of onset: 67) in her maternal grandmother; Depression in her father, mother, sister, and sister; Diabetes in her sister; Heart attack in her paternal grandfather; Heart disease in her father, maternal grandfather, and paternal grandfather; Hyperlipidemia in her maternal grandmother, mother, and sister; Hypertension in her father, maternal grandfather, mother, paternal grandfather, sister, and sister; Polycystic ovary syndrome in her sister; Stroke in her father; Thyroid disease in her maternal grandmother.  Constitutional Exam  General appearance: alert, cooperative and moderately obese Vitals:   07/17/17 1111  BP: 115/76  Pulse: 73  Resp: 18  Temp: 98.3 F (36.8 C)  TempSrc: Oral  SpO2: 100%  Weight: 218 lb (98.9 kg)  Height: 5' 3" (1.6 m)   BMI Assessment: Estimated body mass  index is 38.62 kg/m as calculated from the following:   Height as of this encounter: 5' 3" (1.6 m).   Weight as of this encounter: 218 lb (98.9 kg). Psych/Mental status: Alert, oriented x 3 (person, place, & time)       Eyes: PERLA Respiratory: No evidence of acute respiratory distress  Cervical Spine Area Exam  Skin & Axial Inspection: No masses, redness, edema, swelling, or associated skin lesions Alignment: Symmetrical Functional ROM: Unrestricted ROM      Stability: No instability detected Muscle Tone/Strength: Functionally intact. No obvious neuro-muscular anomalies detected. Sensory (Neurological): Unimpaired Palpation: Complains of area being tender to palpation              Upper Extremity (UE) Exam    Side: Right upper extremity  Side: Left upper extremity  Skin & Extremity Inspection: Skin color, temperature, and hair growth are WNL. No peripheral edema or cyanosis. No masses, redness, swelling, asymmetry, or associated skin lesions. No contractures.  Skin & Extremity Inspection: Skin color, temperature, and hair growth are WNL. No peripheral edema or cyanosis. No masses, redness, swelling, asymmetry, or associated skin lesions. No contractures.  Functional ROM: Unrestricted ROM          Functional ROM: Unrestricted ROM          Muscle Tone/Strength: Functionally intact.  No obvious neuro-muscular anomalies detected.  Muscle Tone/Strength: Functionally intact. No obvious neuro-muscular anomalies detected.  Sensory (Neurological): Unimpaired          Sensory (Neurological): Unimpaired          Palpation: No palpable anomalies              Palpation: No palpable anomalies              Specialized Test(s): Deferred         Specialized Test(s): Deferred          Thoracic Spine Area Exam  Skin & Axial Inspection: No masses, redness, or swelling Alignment: Symmetrical Functional ROM: Unrestricted ROM Stability: No instability detected Muscle Tone/Strength: Functionally intact. No  obvious neuro-muscular anomalies detected. Sensory (Neurological): Unimpaired Muscle strength & Tone: No palpable anomalies  Lumbar Spine Area Exam  Skin & Axial Inspection: No masses, redness, or swelling Alignment: Symmetrical Functional ROM: Unrestricted ROM      Stability: No instability detected Muscle Tone/Strength: Functionally intact. No obvious neuro-muscular anomalies detected. Sensory (Neurological): Unimpaired Palpation: No palpable anomalies       Provocative Tests: Lumbar Hyperextension and rotation test: evaluation deferred today       Lumbar Lateral bending test: evaluation deferred today       Patrick's Maneuver: evaluation deferred today                    Gait & Posture Assessment  Ambulation: Unassisted Gait: Relatively normal for age and body habitus Posture: WNL   Lower Extremity Exam    Side: Right lower extremity  Side: Left lower extremity  Skin & Extremity Inspection: Skin color, temperature, and hair growth are WNL. No peripheral edema or cyanosis. No masses, redness, swelling, asymmetry, or associated skin lesions. No contractures.  Skin & Extremity Inspection: Skin color, temperature, and hair growth are WNL. No peripheral edema or cyanosis. No masses, redness, swelling, asymmetry, or associated skin lesions. No contractures.  Functional ROM: Unrestricted ROM          Functional ROM: Unrestricted ROM          Muscle Tone/Strength: Functionally intact. No obvious neuro-muscular anomalies detected.  Muscle Tone/Strength: Functionally intact. No obvious neuro-muscular anomalies detected.  Sensory (Neurological): Unimpaired  Sensory (Neurological): Unimpaired  Palpation: No palpable anomalies  Palpation: No palpable anomalies   Assessment  Primary Diagnosis & Pertinent Problem List: The primary encounter diagnosis was Chronic cervical radicular pain (Bilateral) (L>R). Diagnoses of Chronic low back pain (Location of Primary Source of Pain) (Bilateral) (R>L)  (midline), Chronic occipital neuralgia (Location of Tertiary source of pain) (Bilateral) (L>R), Cervico-occipital neuralgia, Fibromyalgia, and Chronic pain syndrome were also pertinent to this visit.  Status Diagnosis  Worsening Persistent Worsening 1. Chronic cervical radicular pain (Bilateral) (L>R)   2. Chronic low back pain (Location of Primary Source of Pain) (Bilateral) (R>L) (midline)   3. Chronic occipital neuralgia (Location of Tertiary source of pain) (Bilateral) (L>R)   4. Cervico-occipital neuralgia   5. Fibromyalgia   6. Chronic pain syndrome     Problems updated and reviewed during this visit: No problems updated. Plan of Care  Pharmacotherapy (Medications Ordered): Meds ordered this encounter  Medications  . pregabalin (LYRICA) 150 MG capsule    Sig: Take 1 capsule (150 mg total) by mouth every 8 (eight) hours.    Dispense:  90 capsule    Refill:  2    Do not add this medication to the electronic "Automatic Refill" notification system.  Patient may have prescription filled one day early if pharmacy is closed on scheduled refill date. 30 days    Order Specific Question:   Supervising Provider    Answer:   Milinda Pointer [[id number]]   New Prescriptions   No medications on file   Medications administered today: Ms. Besse had no medications administered during this visit. Lab-work, procedure(s), and/or referral(s): Orders Placed This Encounter  Procedures  . TRIGGER POINT INJECTION   Imaging and/or referral(s): None  Interventional management options: Planned, scheduled, and/or pending:  Left Trigger Point of injections   Considering:  Therapeutic left cervical epidural steroid injection#3  Palliative left L4-5 lumbar epidural steroid injections  Diagnostic bilateral lumbar facet block #2 Possible bilateral lumbar facet radiofrequency ablation. Diagnostic thoracic facet block  Possible bilateral thoracic facet radiofrequency ablation. Diagnostic  bilateral cervical facet blocks  Possible bilateral cervical facet radiofrequency ablation. Diagnostic bilateral lesser occipital nerve blocks  Diagnostic bilateral C2 and TON nerve block  Possible bilateral occipital nerve radiofrequency ablation. Diagnostic right T10-11 thoracic epidural steroid injection  Diagnostic bilateral suprascapular nerve block  Diagnostic bilateral intra-articular knee joint injection  Possible bilateral intra-articular Hyalgan knee injections.  Diagnostic bilateral Genicular nerve block  Possible bilateral Genicular nerve radiofrequency ablation.  Botox injections   Palliative PRN treatment(s):  Palliative left L4-5 lumbar epidural steroid injections  Diagnostic bilateral lumbar facet block  Diagnostic thoracic facet block  Palliative Left cervical epidural steroid injections  Diagnostic bilateral cervical facet blocks  Diagnostic bilateral lesser occipital nerve blocks  Diagnostic bilateral C2 and TON nerve block  Diagnostic right T10-11 thoracic epidural steroid injection  Diagnostic bilateral suprascapular nerve block  Diagnostic bilateral intra-articular knee joint injection  Diagnostic bilateral Genicular nerve block    Provider-requested follow-up: Return for Procedure(NS), w/ Dr. Dossie Arbour.  Future Appointments Date Time Provider [place]  07/22/2017 1:15 PM Milinda Pointer, MD [facility] None  08/12/2017 9:00 AM Arnetha Courser, MD [place] [facility]  10/14/2017 8:30 AM Vevelyn Francois, NP [facility] None   Primary Care Physician: Arnetha Courser, MD Location: [facility] Note by: Vevelyn Francois NP Date: 07/17/2017; Time: 3:36 PM  Pain Score Disclaimer: We use the NRS-11 scale. This is a self-reported, subjective measurement of pain severity with only modest accuracy. It is used primarily to identify changes within a particular patient. It must be understood that outpatient pain scales are significantly  less accurate that those used for research, where they can be applied under ideal controlled circumstances with minimal exposure to variables. In reality, the score is likely to be a combination of pain intensity and pain affect, where pain affect describes the degree of emotional arousal or changes in action readiness caused by the sensory experience of pain. Factors such as social and work situation, setting, emotional state, anxiety levels, expectation, and prior pain experience may influence pain perception and show large inter-individual differences that may also be affected by time variables.  Patient instructions provided during this appointment: Patient Instructions    ____________________________________________________________________________________________  Medication Rules  Applies to: All patients receiving prescriptions (written or electronic).  Pharmacy of record: Pharmacy where electronic prescriptions will be sent. If written prescriptions are taken to a different pharmacy, please inform the nursing staff. The pharmacy listed in the electronic medical record should be the one where you would like electronic prescriptions to be sent.  Prescription refills: Only during scheduled appointments. Applies to both, written and electronic prescriptions.  NOTE: The following applies primarily to controlled substances (Opioid*  Pain Medications).   Patient's responsibilities: 1. Pain Pills: Bring all pain pills to every appointment (except for procedure appointments). 2. Pill Bottles: Bring pills in original pharmacy bottle. Always bring newest bottle. Bring bottle, even if empty. 3. Medication refills: You are responsible for knowing and keeping track of what medications you need refilled. The day before your appointment, write a list of all prescriptions that need to be refilled. Bring that list to your appointment and give it to the admitting nurse. Prescriptions will be written only  during appointments. If you forget a medication, it will not be "Called in", "Faxed", or "electronically sent". You will need to get another appointment to get these prescribed. 4. Prescription Accuracy: You are responsible for carefully inspecting your prescriptions before leaving our office. Have the discharge nurse carefully go over each prescription with you, before taking them home. Make sure that your name is accurately spelled, that your address is correct. Check the name and dose of your medication to make sure it is accurate. Check the number of pills, and the written instructions to make sure they are clear and accurate. Make sure that you are given enough medication to last until your next medication refill appointment. 5. Taking Medication: Take medication as prescribed. Never take more pills than instructed. Never take medication more frequently than prescribed. Taking less pills or less frequently is permitted and encouraged, when it comes to controlled substances (written prescriptions).  6. Inform other Doctors: Always inform, all of your healthcare providers, of all the medications you take. 7. Pain Medication from other Providers: You are not allowed to accept any additional pain medication from any other Doctor or Healthcare provider. There are two exceptions to this rule. (see below) In the event that you require additional pain medication, you are responsible for notifying us, as stated below. 8. Medication Agreement: You are responsible for carefully reading and following our Medication Agreement. This must be signed before receiving any prescriptions from our practice. Safely store a copy of your signed Agreement. Violations to the Agreement will result in no further prescriptions. (Additional copies of our Medication Agreement are available upon request.) 9. Laws, Rules, & Regulations: All patients are expected to follow all Federal and [employer], Rules, [employer].  Ignorance of the Laws does not constitute a valid excuse. The use of any illegal substances is prohibited. 10. Adopted CDC guidelines & recommendations: Target dosing levels will be at or below 60 MME/day. Use of benzodiazepines** is not recommended.  Exceptions: There are only two exceptions to the rule of not receiving pain medications from other Healthcare Providers. 1. Exception #1 (Emergencies): In the event of an emergency (i.e.: accident requiring emergency care), you are allowed to receive additional pain medication. However, you are responsible for: As soon as you are able, call our office [phone], at any time of the day or night, and leave a message stating your name, the date and nature of the emergency, and the name and dose of the medication prescribed. In the event that your call is answered by a member of our staff, make sure to document and save the date, time, and the name of the person that took your information.  2. Exception #2 (Planned Surgery): In the event that you are scheduled by another doctor or dentist to have any type of surgery or procedure, you are allowed (for a period no longer than 30 days), to receive additional pain medication, for the acute post-op pain. However, in this  case, you are responsible for picking up a copy of our "Post-op Pain Management for Surgeons" handout, and giving it to your surgeon or dentist. This document is available at our office, and does not require an appointment to obtain it. Simply go to our office during business hours (Monday-Thursday from 8:00 AM to 4:00 PM) (Friday 8:00 AM to 12:00 Noon) or if you have a scheduled appointment with [place], prior to your surgery, and ask for it by name. In addition, you will need to provide [place] with your name, name of your surgeon, type of surgery, and date of procedure or surgery.  *Opioid medications include: morphine, codeine, oxycodone, oxymorphone, hydrocodone, hydromorphone, meperidine, tramadol,  tapentadol, buprenorphine, fentanyl, methadone. **Benzodiazepine medications include: diazepam (Valium), alprazolam (Xanax), clonazepam (Klonopine), lorazepam (Ativan), clorazepate (Tranxene), chlordiazepoxide (Librium), estazolam (Prosom), oxazepam (Serax), temazepam (Restoril), triazolam (Halcion)  ____________________________________________________________________________________________  BMI Assessment: Estimated body mass index is 38.62 kg/m as calculated from the following:   Height as of this encounter: 5' 3" (1.6 m).   Weight as of this encounter: 218 lb (98.9 kg).  BMI interpretation table: BMI level Category Range association with higher incidence of chronic pain  <18 kg/m2 Underweight   18.5-24.9 kg/m2 Ideal body weight   25-29.9 kg/m2 Overweight Increased incidence by 20%  30-34.9 kg/m2 Obese (Class I) Increased incidence by 68%  35-39.9 kg/m2 Severe obesity (Class II) Increased incidence by 136%  >40 kg/m2 Extreme obesity (Class III) Increased incidence by 254%   BMI Readings from Last 4 Encounters:  07/17/17 38.62 kg/m  06/09/17 38.78 kg/m  06/03/17 38.92 kg/m  05/30/17 38.89 kg/m   Wt Readings from Last 4 Encounters:  07/17/17 218 lb (98.9 kg)  06/09/17 218 lb 14.4 oz (99.3 kg)  06/03/17 219 lb 11.2 oz (99.7 kg)  05/30/17 219 lb 9 oz (99.6 kg)  GENERAL RISKS AND COMPLICATIONS  What are the risk, side effects and possible complications? Generally speaking, most procedures are safe.  However, with any procedure there are risks, side effects, and the possibility of complications.  The risks and complications are dependent upon the sites that are lesioned, or the type of nerve block to be performed.  The closer the procedure is to the spine, the more serious the risks are.  Great care is taken when placing the radio frequency needles, block needles or lesioning probes, but sometimes complications can occur. 1. Infection: Any time there is an injection through the  skin, there is a risk of infection.  This is why sterile conditions are used for these blocks.  There are four possible types of infection. 1. Localized skin infection. 2. Central Nervous System Infection-This can be in the form of Meningitis, which can be deadly. 3. Epidural Infections-This can be in the form of an epidural abscess, which can cause pressure inside of the spine, causing compression of the spinal cord with subsequent paralysis. This would require an emergency surgery to decompress, and there are no guarantees that the patient would recover from the paralysis. 4. Discitis-This is an infection of the intervertebral discs.  It occurs in about 1% of discography procedures.  It is difficult to treat and it may lead to surgery.        2. Pain: the needles have to go through skin and soft tissues, will cause soreness.       3. Damage to internal structures:  The nerves to be lesioned may be near blood vessels or    other nerves which can be potentially damaged.  4. Bleeding: Bleeding is more common if the patient is taking blood thinners such as  aspirin, Coumadin, Ticiid, Plavix, etc., or if he/she have some genetic predisposition  such as hemophilia. Bleeding into the spinal canal can cause compression of the spinal  cord with subsequent paralysis.  This would require an emergency surgery to  decompress and there are no guarantees that the patient would recover from the  paralysis.       5. Pneumothorax:  Puncturing of a lung is a possibility, every time a needle is introduced in  the area of the chest or upper back.  Pneumothorax refers to free air around the  collapsed lung(s), inside of the thoracic cavity (chest cavity).  Another two possible  complications related to a similar event would include: Hemothorax and Chylothorax.   These are variations of the Pneumothorax, where instead of air around the collapsed  lung(s), you may have blood or chyle, respectively.       6. Spinal  headaches: They may occur with any procedures in the area of the spine.       7. Persistent CSF (Cerebro-Spinal Fluid) leakage: This is a rare problem, but may occur  with prolonged intrathecal or epidural catheters either due to the formation of a fistulous  track or a dural tear.       8. Nerve damage: By working so close to the spinal cord, there is always a possibility of  nerve damage, which could be as serious as a permanent spinal cord injury with  paralysis.       9. Death:  Although rare, severe deadly allergic reactions known as "Anaphylactic  reaction" can occur to any of the medications used.      10. Worsening of the symptoms:  We can always make thing worse.  What are the chances of something like this happening? Chances of any of this occuring are extremely low.  By statistics, you have more of a chance of getting killed in a motor vehicle accident: while driving to the hospital than any of the above occurring .  Nevertheless, you should be aware that they are possibilities.  In general, it is similar to taking a shower.  Everybody knows that you can slip, hit your head and get killed.  Does that mean that you should not shower again?  Nevertheless always keep in mind that statistics do not mean anything if you happen to be on the wrong side of them.  Even if a procedure has a 1 (one) in a 1,000,000 (million) chance of going wrong, it you happen to be that one..Also, keep in mind that by statistics, you have more of a chance of having something go wrong when taking medications.  Who should not have this procedure? If you are on a blood thinning medication (e.g. Coumadin, Plavix, see list of "Blood Thinners"), or if you have an active infection going on, you should not have the procedure.  If you are taking any blood thinners, please inform your physician.  How should I prepare for this procedure?  Do not eat or drink anything at least six hours prior to the procedure.  Bring a driver  with you .  It cannot be a taxi.  Come accompanied by an adult that can drive you back, and that is strong enough to help you if your legs get weak or numb from the local anesthetic.  Take all of your medicines the morning of the procedure with just enough water  to swallow them.  If you have diabetes, make sure that you are scheduled to have your procedure done first thing in the morning, whenever possible.  If you have diabetes, take only half of your insulin dose and notify our nurse that you have done so as soon as you arrive at the clinic.  If you are diabetic, but only take blood sugar pills (oral hypoglycemic), then do not take them on the morning of your procedure.  You may take them after you have had the procedure.  Do not take aspirin or any aspirin-containing medications, at least eleven (11) days prior to the procedure.  They may prolong bleeding.  Wear loose fitting clothing that may be easy to take off and that you would not mind if it got stained with Betadine or blood.  Do not wear any jewelry or perfume  Remove any nail coloring.  It will interfere with some of our monitoring equipment.  NOTE: Remember that this is not meant to be interpreted as a complete list of all possible complications.  Unforeseen problems may occur.  BLOOD THINNERS The following drugs contain aspirin or other products, which can cause increased bleeding during surgery and should not be taken for 2 weeks prior to and 1 week after surgery.  If you should need take something for relief of minor pain, you may take acetaminophen which is found in Tylenol,m Datril, Anacin-3 and Panadol. It is not blood thinner. The products listed below are.  Do not take any of the products listed below in addition to any listed on your instruction sheet.  A.P.C or A.P.C with Codeine Codeine Phosphate Capsules #3 Ibuprofen Ridaura  ABC compound Congesprin Imuran rimadil  Advil Cope Indocin Robaxisal  Alka-Seltzer  Effervescent Pain Reliever and Antacid Coricidin or Coricidin-D  Indomethacin Rufen  Alka-Seltzer plus Cold Medicine Cosprin Ketoprofen S-A-C Tablets  Anacin Analgesic Tablets or Capsules Coumadin Korlgesic Salflex  Anacin Extra Strength Analgesic tablets or capsules CP-2 Tablets Lanoril Salicylate  Anaprox Cuprimine Capsules Levenox Salocol  Anexsia-D Dalteparin Magan Salsalate  Anodynos Darvon compound Magnesium Salicylate Sine-off  Ansaid Dasin Capsules Magsal Sodium Salicylate  Anturane Depen Capsules Marnal Soma  APF Arthritis pain formula Dewitt's Pills Measurin Stanback  Argesic Dia-Gesic Meclofenamic Sulfinpyrazone  Arthritis Bayer Timed Release Aspirin Diclofenac Meclomen Sulindac  Arthritis pain formula Anacin Dicumarol Medipren Supac  Analgesic (Safety coated) Arthralgen Diffunasal Mefanamic Suprofen  Arthritis Strength Bufferin Dihydrocodeine Mepro Compound Suprol  Arthropan liquid Dopirydamole Methcarbomol with Aspirin Synalgos  ASA tablets/Enseals Disalcid Micrainin Tagament  Ascriptin Doan's Midol Talwin  Ascriptin A/D Dolene Mobidin Tanderil  Ascriptin Extra Strength Dolobid Moblgesic Ticlid  Ascriptin with Codeine Doloprin or Doloprin with Codeine Momentum Tolectin  Asperbuf Duoprin Mono-gesic Trendar  Aspergum Duradyne Motrin or Motrin IB Triminicin  Aspirin plain, buffered or enteric coated Durasal Myochrisine Trigesic  Aspirin Suppositories Easprin Nalfon Trillsate  Aspirin with Codeine Ecotrin Regular or Extra Strength Naprosyn Uracel  Atromid-S Efficin Naproxen Ursinus  Auranofin Capsules Elmiron Neocylate Vanquish  Axotal Emagrin Norgesic Verin  Azathioprine Empirin or Empirin with Codeine Normiflo Vitamin E  Azolid Emprazil Nuprin Voltaren  Bayer Aspirin plain, buffered or children's or timed BC Tablets or powders Encaprin Orgaran Warfarin Sodium  Buff-a-Comp Enoxaparin Orudis Zorpin  Buff-a-Comp with Codeine Equegesic Os-Cal-Gesic   Buffaprin Excedrin  plain, buffered or Extra Strength Oxalid   Bufferin Arthritis Strength Feldene Oxphenbutazone   Bufferin plain or Extra Strength Feldene Capsules Oxycodone with Aspirin   Bufferin with Codeine Fenoprofen Fenoprofen Pabalate or Pabalate-SF  Buffets II Flogesic Panagesic   Buffinol plain or Extra Strength Florinal or Florinal with Codeine Panwarfarin   Buf-Tabs Flurbiprofen Penicillamine   Butalbital Compound Four-way cold tablets Penicillin   Butazolidin Fragmin Pepto-Bismol   Carbenicillin Geminisyn Percodan   Carna Arthritis Reliever Geopen Persantine   Carprofen Gold's salt Persistin   Chloramphenicol Goody's Phenylbutazone   Chloromycetin Haltrain Piroxlcam   Clmetidine heparin Plaquenil   Cllnoril Hyco-pap Ponstel   Clofibrate Hydroxy chloroquine Propoxyphen         Before stopping any of these medications, be sure to consult the physician who ordered them.  Some, such as Coumadin (Warfarin) are ordered to prevent or treat serious conditions such as "deep thrombosis", "pumonary embolisms", and other heart problems.  The amount of time that you may need off of the medication may also vary with the medication and the reason for which you were taking it.  If you are taking any of these medications, please make sure you notify your pain physician before you undergo any procedures.          Trigger Point Injection Trigger points are areas where you have pain. A trigger point injection is a shot given in the trigger point to help relieve pain for a few days to a few months. Common places for trigger points include:  The neck.  The shoulders.  The upper back.  The lower back.  A trigger point injection will not cure long-lasting (chronic) pain permanently. These injections do not always work for every person, but for some people they can help to relieve pain for a few days to a few months. Tell a health care provider about:  Any allergies you have.  All medicines you are  taking, including vitamins, herbs, eye drops, creams, and over-the-counter medicines.  Any problems you or family members have had with anesthetic medicines.  Any blood disorders you have.  Any surgeries you have had.  Any medical conditions you have. What are the risks? Generally, this is a safe procedure. However, problems may occur, including:  Infection.  Bleeding.  Allergic reaction to the injected medicine.  Irritation of the skin around the injection site.  What happens before the procedure?  Ask your health care provider about changing or stopping your regular medicines. This is especially important if you are taking diabetes medicines or blood thinners. What happens during the procedure?  Your health care provider will feel for trigger points. A marker may be used to circle the area for the injection.  The skin over the trigger point will be washed with a germ-killing (antiseptic) solution.  A thin needle is used for the shot. You may feel pain or a twitching feeling when the needle enters the trigger point.  A numbing solution may be injected into the trigger point. Sometimes a medicine to keep down swelling, redness, and warmth (inflammation) is also injected.  Your health care provider may move the needle around the area where the trigger point is located until the tightness and twitching goes away.  After the injection, your health care provider may put gentle pressure over the injection site.  The injection site will be covered with a bandage (dressing). The procedure may vary among health care providers and hospitals. What happens after the procedure?  The dressing can be taken off in a few hours or as told by your health care provider.  You may feel sore and stiff for 1-2 days. This information is not intended to replace advice given to  you by your health care provider. Make sure you discuss any questions you have with your health care provider. Document  Released: 09/05/2011 Document Revised: 05/19/2016 Document Reviewed: 03/06/2015 Elsevier Interactive Patient Education  2018 [employer].

## 2017-07-17 NOTE — Patient Instructions (Addendum)
____________________________________________________________________________________________  Medication Rules  Applies to: All patients receiving prescriptions (written or electronic).  Pharmacy of record: Pharmacy where electronic prescriptions will be sent. If written prescriptions are taken to a different pharmacy, please inform the nursing staff. The pharmacy listed in the electronic medical record should be the one where you would like electronic prescriptions to be sent.  Prescription refills: Only during scheduled appointments. Applies to both, written and electronic prescriptions.  NOTE: The following applies primarily to controlled substances (Opioid* Pain Medications).   Patient's responsibilities: 1. Pain Pills: Bring all pain pills to every appointment (except for procedure appointments). 2. Pill Bottles: Bring pills in original pharmacy bottle. Always bring newest bottle. Bring bottle, even if empty. 3. Medication refills: You are responsible for knowing and keeping track of what medications you need refilled. The day before your appointment, write a list of all prescriptions that need to be refilled. Bring that list to your appointment and give it to the admitting nurse. Prescriptions will be written only during appointments. If you forget a medication, it will not be "Called in", "Faxed", or "electronically sent". You will need to get another appointment to get these prescribed. 4. Prescription Accuracy: You are responsible for carefully inspecting your prescriptions before leaving our office. Have the discharge nurse carefully go over each prescription with you, before taking them home. Make sure that your name is accurately spelled, that your address is correct. Check the name and dose of your medication to make sure it is accurate. Check the number of pills, and the written instructions to make sure they are clear and accurate. Make sure that you are given enough medication to  last until your next medication refill appointment. 5. Taking Medication: Take medication as prescribed. Never take more pills than instructed. Never take medication more frequently than prescribed. Taking less pills or less frequently is permitted and encouraged, when it comes to controlled substances (written prescriptions).  6. Inform other Doctors: Always inform, all of your healthcare providers, of all the medications you take. 7. Pain Medication from other Providers: You are not allowed to accept any additional pain medication from any other Doctor or Healthcare provider. There are two exceptions to this rule. (see below) In the event that you require additional pain medication, you are responsible for notifying us, as stated below. 8. Medication Agreement: You are responsible for carefully reading and following our Medication Agreement. This must be signed before receiving any prescriptions from our practice. Safely store a copy of your signed Agreement. Violations to the Agreement will result in no further prescriptions. (Additional copies of our Medication Agreement are available upon request.) 9. Laws, Rules, & Regulations: All patients are expected to follow all Federal and State Laws, Statutes, Rules, & Regulations. Ignorance of the Laws does not constitute a valid excuse. The use of any illegal substances is prohibited. 10. Adopted CDC guidelines & recommendations: Target dosing levels will be at or below 60 MME/day. Use of benzodiazepines** is not recommended.  Exceptions: There are only two exceptions to the rule of not receiving pain medications from other Healthcare Providers. 1. Exception #1 (Emergencies): In the event of an emergency (i.e.: accident requiring emergency care), you are allowed to receive additional pain medication. However, you are responsible for: As soon as you are able, call our office [phone], at any time of the day or night, and leave a message stating your  name, the date and nature of the emergency, and the name and dose of the medication   prescribed. In the event that your call is answered by a member of our staff, make sure to document and save the date, time, and the name of the person that took your information.  2. Exception #2 (Planned Surgery): In the event that you are scheduled by another doctor or dentist to have any type of surgery or procedure, you are allowed (for a period no longer than 30 days), to receive additional pain medication, for the acute post-op pain. However, in this case, you are responsible for picking up a copy of our "Post-op Pain Management for Surgeons" handout, and giving it to your surgeon or dentist. This document is available at our office, and does not require an appointment to obtain it. Simply go to our office during business hours (Monday-Thursday from 8:00 AM to 4:00 PM) (Friday 8:00 AM to 12:00 Noon) or if you have a scheduled appointment with [place], prior to your surgery, and ask for it by name. In addition, you will need to provide [place] with your name, name of your surgeon, type of surgery, and date of procedure or surgery.  *Opioid medications include: morphine, codeine, oxycodone, oxymorphone, hydrocodone, hydromorphone, meperidine, tramadol, tapentadol, buprenorphine, fentanyl, methadone. **Benzodiazepine medications include: diazepam (Valium), alprazolam (Xanax), clonazepam (Klonopine), lorazepam (Ativan), clorazepate (Tranxene), chlordiazepoxide (Librium), estazolam (Prosom), oxazepam (Serax), temazepam (Restoril), triazolam (Halcion)  ____________________________________________________________________________________________  BMI Assessment: Estimated body mass index is 38.62 kg/m as calculated from the following:   Height as of this encounter: 5\' 3"  (1.6 m).   Weight as of this encounter: 218 lb (98.9 kg).  BMI interpretation table: BMI level Category Range association with higher incidence of chronic pain   <18 kg/m2 Underweight   18.5-24.9 kg/m2 Ideal body weight   25-29.9 kg/m2 Overweight Increased incidence by 20%  30-34.9 kg/m2 Obese (Class I) Increased incidence by 68%  35-39.9 kg/m2 Severe obesity (Class II) Increased incidence by 136%  >40 kg/m2 Extreme obesity (Class III) Increased incidence by 254%   BMI Readings from Last 4 Encounters:  07/17/17 38.62 kg/m  06/09/17 38.78 kg/m  06/03/17 38.92 kg/m  05/30/17 38.89 kg/m   Wt Readings from Last 4 Encounters:  07/17/17 218 lb (98.9 kg)  06/09/17 218 lb 14.4 oz (99.3 kg)  06/03/17 219 lb 11.2 oz (99.7 kg)  05/30/17 219 lb 9 oz (99.6 kg)  GENERAL RISKS AND COMPLICATIONS  What are the risk, side effects and possible complications? Generally speaking, most procedures are safe.  However, with any procedure there are risks, side effects, and the possibility of complications.  The risks and complications are dependent upon the sites that are lesioned, or the type of nerve block to be performed.  The closer the procedure is to the spine, the more serious the risks are.  Great care is taken when placing the radio frequency needles, block needles or lesioning probes, but sometimes complications can occur. 1. Infection: Any time there is an injection through the skin, there is a risk of infection.  This is why sterile conditions are used for these blocks.  There are four possible types of infection. 1. Localized skin infection. 2. Central Nervous System Infection-This can be in the form of Meningitis, which can be deadly. 3. Epidural Infections-This can be in the form of an epidural abscess, which can cause pressure inside of the spine, causing compression of the spinal cord with subsequent paralysis. This would require an emergency surgery to decompress, and there are no guarantees that the patient would recover from the paralysis. 4. Discitis-This is  an infection of the intervertebral discs.  It occurs in about 1% of discography procedures.   It is difficult to treat and it may lead to surgery.        2. Pain: the needles have to go through skin and soft tissues, will cause soreness.       3. Damage to internal structures:  The nerves to be lesioned may be near blood vessels or    other nerves which can be potentially damaged.       4. Bleeding: Bleeding is more common if the patient is taking blood thinners such as  aspirin, Coumadin, Ticiid, Plavix, etc., or if he/she have some genetic predisposition  such as hemophilia. Bleeding into the spinal canal can cause compression of the spinal  cord with subsequent paralysis.  This would require an emergency surgery to  decompress and there are no guarantees that the patient would recover from the  paralysis.       5. Pneumothorax:  Puncturing of a lung is a possibility, every time a needle is introduced in  the area of the chest or upper back.  Pneumothorax refers to free air around the  collapsed lung(s), inside of the thoracic cavity (chest cavity).  Another two possible  complications related to a similar event would include: Hemothorax and Chylothorax.   These are variations of the Pneumothorax, where instead of air around the collapsed  lung(s), you may have blood or chyle, respectively.       6. Spinal headaches: They may occur with any procedures in the area of the spine.       7. Persistent CSF (Cerebro-Spinal Fluid) leakage: This is a rare problem, but may occur  with prolonged intrathecal or epidural catheters either due to the formation of a fistulous  track or a dural tear.       8. Nerve damage: By working so close to the spinal cord, there is always a possibility of  nerve damage, which could be as serious as a permanent spinal cord injury with  paralysis.       9. Death:  Although rare, severe deadly allergic reactions known as "Anaphylactic  reaction" can occur to any of the medications used.      10. Worsening of the symptoms:  We can always make thing worse.  What are the  chances of something like this happening? Chances of any of this occuring are extremely low.  By statistics, you have more of a chance of getting killed in a motor vehicle accident: while driving to the hospital than any of the above occurring .  Nevertheless, you should be aware that they are possibilities.  In general, it is similar to taking a shower.  Everybody knows that you can slip, hit your head and get killed.  Does that mean that you should not shower again?  Nevertheless always keep in mind that statistics do not mean anything if you happen to be on the wrong side of them.  Even if a procedure has a 1 (one) in a 1,000,000 (million) chance of going wrong, it you happen to be that one..Also, keep in mind that by statistics, you have more of a chance of having something go wrong when taking medications.  Who should not have this procedure? If you are on a blood thinning medication (e.g. Coumadin, Plavix, see list of "Blood Thinners"), or if you have an active infection going on, you should not have the procedure.  If you are taking  any blood thinners, please inform your physician.  How should I prepare for this procedure?  Do not eat or drink anything at least six hours prior to the procedure.  Bring a driver with you .  It cannot be a taxi.  Come accompanied by an adult that can drive you back, and that is strong enough to help you if your legs get weak or numb from the local anesthetic.  Take all of your medicines the morning of the procedure with just enough water to swallow them.  If you have diabetes, make sure that you are scheduled to have your procedure done first thing in the morning, whenever possible.  If you have diabetes, take only half of your insulin dose and notify our nurse that you have done so as soon as you arrive at the clinic.  If you are diabetic, but only take blood sugar pills (oral hypoglycemic), then do not take them on the morning of your procedure.  You may  take them after you have had the procedure.  Do not take aspirin or any aspirin-containing medications, at least eleven (11) days prior to the procedure.  They may prolong bleeding.  Wear loose fitting clothing that may be easy to take off and that you would not mind if it got stained with Betadine or blood.  Do not wear any jewelry or perfume  Remove any nail coloring.  It will interfere with some of our monitoring equipment.  NOTE: Remember that this is not meant to be interpreted as a complete list of all possible complications.  Unforeseen problems may occur.  BLOOD THINNERS The following drugs contain aspirin or other products, which can cause increased bleeding during surgery and should not be taken for 2 weeks prior to and 1 week after surgery.  If you should need take something for relief of minor pain, you may take acetaminophen which is found in Tylenol,m Datril, Anacin-3 and Panadol. It is not blood thinner. The products listed below are.  Do not take any of the products listed below in addition to any listed on your instruction sheet.  A.P.C or A.P.C with Codeine Codeine Phosphate Capsules #3 Ibuprofen Ridaura  ABC compound Congesprin Imuran rimadil  Advil Cope Indocin Robaxisal  Alka-Seltzer Effervescent Pain Reliever and Antacid Coricidin or Coricidin-D  Indomethacin Rufen  Alka-Seltzer plus Cold Medicine Cosprin Ketoprofen S-A-C Tablets  Anacin Analgesic Tablets or Capsules Coumadin Korlgesic Salflex  Anacin Extra Strength Analgesic tablets or capsules CP-2 Tablets Lanoril Salicylate  Anaprox Cuprimine Capsules Levenox Salocol  Anexsia-D Dalteparin Magan Salsalate  Anodynos Darvon compound Magnesium Salicylate Sine-off  Ansaid Dasin Capsules Magsal Sodium Salicylate  Anturane Depen Capsules Marnal Soma  APF Arthritis pain formula Dewitt's Pills Measurin Stanback  Argesic Dia-Gesic Meclofenamic Sulfinpyrazone  Arthritis Bayer Timed Release Aspirin Diclofenac Meclomen  Sulindac  Arthritis pain formula Anacin Dicumarol Medipren Supac  Analgesic (Safety coated) Arthralgen Diffunasal Mefanamic Suprofen  Arthritis Strength Bufferin Dihydrocodeine Mepro Compound Suprol  Arthropan liquid Dopirydamole Methcarbomol with Aspirin Synalgos  ASA tablets/Enseals Disalcid Micrainin Tagament  Ascriptin Doan's Midol Talwin  Ascriptin A/D Dolene Mobidin Tanderil  Ascriptin Extra Strength Dolobid Moblgesic Ticlid  Ascriptin with Codeine Doloprin or Doloprin with Codeine Momentum Tolectin  Asperbuf Duoprin Mono-gesic Trendar  Aspergum Duradyne Motrin or Motrin IB Triminicin  Aspirin plain, buffered or enteric coated Durasal Myochrisine Trigesic  Aspirin Suppositories Easprin Nalfon Trillsate  Aspirin with Codeine Ecotrin Regular or Extra Strength Naprosyn Uracel  Atromid-S Efficin Naproxen Ursinus  Auranofin Capsules Elmiron Neocylate  Vanquish  Axotal Emagrin Norgesic Verin  Azathioprine Empirin or Empirin with Codeine Normiflo Vitamin E  Azolid Emprazil Nuprin Voltaren  Bayer Aspirin plain, buffered or children's or timed BC Tablets or powders Encaprin Orgaran Warfarin Sodium  Buff-a-Comp Enoxaparin Orudis Zorpin  Buff-a-Comp with Codeine Equegesic Os-Cal-Gesic   Buffaprin Excedrin plain, buffered or Extra Strength Oxalid   Bufferin Arthritis Strength Feldene Oxphenbutazone   Bufferin plain or Extra Strength Feldene Capsules Oxycodone with Aspirin   Bufferin with Codeine Fenoprofen Fenoprofen Pabalate or Pabalate-SF   Buffets II Flogesic Panagesic   Buffinol plain or Extra Strength Florinal or Florinal with Codeine Panwarfarin   Buf-Tabs Flurbiprofen Penicillamine   Butalbital Compound Four-way cold tablets Penicillin   Butazolidin Fragmin Pepto-Bismol   Carbenicillin Geminisyn Percodan   Carna Arthritis Reliever Geopen Persantine   Carprofen Gold's salt Persistin   Chloramphenicol Goody's Phenylbutazone   Chloromycetin Haltrain Piroxlcam   Clmetidine heparin  Plaquenil   Cllnoril Hyco-pap Ponstel   Clofibrate Hydroxy chloroquine Propoxyphen         Before stopping any of these medications, be sure to consult the physician who ordered them.  Some, such as Coumadin (Warfarin) are ordered to prevent or treat serious conditions such as "deep thrombosis", "pumonary embolisms", and other heart problems.  The amount of time that you may need off of the medication may also vary with the medication and the reason for which you were taking it.  If you are taking any of these medications, please make sure you notify your pain physician before you undergo any procedures.          Trigger Point Injection Trigger points are areas where you have pain. A trigger point injection is a shot given in the trigger point to help relieve pain for a few days to a few months. Common places for trigger points include:  The neck.  The shoulders.  The upper back.  The lower back.  A trigger point injection will not cure long-lasting (chronic) pain permanently. These injections do not always work for every person, but for some people they can help to relieve pain for a few days to a few months. Tell a health care provider about:  Any allergies you have.  All medicines you are taking, including vitamins, herbs, eye drops, creams, and over-the-counter medicines.  Any problems you or family members have had with anesthetic medicines.  Any blood disorders you have.  Any surgeries you have had.  Any medical conditions you have. What are the risks? Generally, this is a safe procedure. However, problems may occur, including:  Infection.  Bleeding.  Allergic reaction to the injected medicine.  Irritation of the skin around the injection site.  What happens before the procedure?  Ask your health care provider about changing or stopping your regular medicines. This is especially important if you are taking diabetes medicines or blood thinners. What happens  during the procedure?  Your health care provider will feel for trigger points. A marker may be used to circle the area for the injection.  The skin over the trigger point will be washed with a germ-killing (antiseptic) solution.  A thin needle is used for the shot. You may feel pain or a twitching feeling when the needle enters the trigger point.  A numbing solution may be injected into the trigger point. Sometimes a medicine to keep down swelling, redness, and warmth (inflammation) is also injected.  Your health care provider may move the needle around the area where the trigger  point is located until the tightness and twitching goes away.  After the injection, your health care provider may put gentle pressure over the injection site.  The injection site will be covered with a bandage (dressing). The procedure may vary among health care providers and hospitals. What happens after the procedure?  The dressing can be taken off in a few hours or as told by your health care provider.  You may feel sore and stiff for 1-2 days. This information is not intended to replace advice given to you by your health care provider. Make sure you discuss any questions you have with your health care provider. Document Released: 09/05/2011 Document Revised: 05/19/2016 Document Reviewed: 03/06/2015 Elsevier Interactive Patient Education  2018 [employer].

## 2017-07-22 ENCOUNTER — Ambulatory Visit: Payer: Medicaid Other | Attending: Nurse Practitioner | Admitting: Pain Medicine

## 2017-07-22 ENCOUNTER — Encounter: Payer: Self-pay | Admitting: Pain Medicine

## 2017-07-22 VITALS — BP 118/80 | HR 74 | Temp 98.4°F | Resp 18 | Ht 63.0 in | Wt 218.0 lb

## 2017-07-22 DIAGNOSIS — M7918 Myalgia, other site: Secondary | ICD-10-CM | POA: Diagnosis not present

## 2017-07-22 DIAGNOSIS — G8929 Other chronic pain: Secondary | ICD-10-CM | POA: Diagnosis not present

## 2017-07-22 DIAGNOSIS — M5481 Occipital neuralgia: Secondary | ICD-10-CM

## 2017-07-22 DIAGNOSIS — M797 Fibromyalgia: Secondary | ICD-10-CM | POA: Diagnosis not present

## 2017-07-22 DIAGNOSIS — M542 Cervicalgia: Secondary | ICD-10-CM | POA: Insufficient documentation

## 2017-07-22 MED ORDER — LIDOCAINE HCL 2 % IJ SOLN
10.0000 mL | Freq: Once | INTRAMUSCULAR | Status: AC
  Administered 2017-07-22: 400 mg
  Filled 2017-07-22: qty 20

## 2017-07-22 MED ORDER — TRIAMCINOLONE ACETONIDE 40 MG/ML IJ SUSP
40.0000 mg | Freq: Once | INTRAMUSCULAR | Status: AC
  Administered 2017-07-22: 40 mg
  Filled 2017-07-22: qty 1

## 2017-07-22 MED ORDER — ROPIVACAINE HCL 2 MG/ML IJ SOLN
9.0000 mL | Freq: Once | INTRAMUSCULAR | Status: AC
  Administered 2017-07-22: 10 mL via PERINEURAL
  Filled 2017-07-22: qty 10

## 2017-07-22 NOTE — Progress Notes (Signed)
Safety precautions to be maintained throughout the outpatient stay will include: orient to surroundings, keep bed in low position, maintain call bell within reach at all times, provide assistance with transfer out of bed and ambulation.  

## 2017-07-22 NOTE — Patient Instructions (Signed)
____________________________________________________________________________________________  Post-Procedure instructions Instructions:  Apply ice: Fill a plastic sandwich bag with crushed ice. Cover it with a small towel and apply to injection site. Apply for 15 minutes then remove x 15 minutes. Repeat sequence on day of procedure, until you go to bed. The purpose is to minimize swelling and discomfort after procedure.  Apply heat: Apply heat to procedure site starting the day following the procedure. The purpose is to treat any soreness and discomfort from the procedure.  Food intake: Start with clear liquids (like water) and advance to regular food, as tolerated.   Physical activities: Keep activities to a minimum for the first 8 hours after the procedure.   Driving: If you have received any sedation, you are not allowed to drive for 24 hours after your procedure.  Blood thinner: Restart your blood thinner 6 hours after your procedure. (Only for those taking blood thinners)  Insulin: As soon as you can eat, you may resume your normal dosing schedule. (Only for those taking insulin)  Infection prevention: Keep procedure site clean and dry.  Post-procedure Pain Diary: Extremely important that this be done correctly and accurately. Recorded information will be used to determine the next step in treatment.  Pain evaluated is that of treated area only. Do not include pain from an untreated area.  Complete every hour, on the hour, for the initial 8 hours. Set an alarm to help you do this part accurately.  Do not go to sleep and have it completed later. It will not be accurate.  Follow-up appointment: Keep your follow-up appointment after the procedure. Usually 2 weeks for most procedures. (6 weeks in the case of radiofrequency.) Bring you pain diary.  Expect:  From numbing medicine (AKA: Local Anesthetics): Numbness or decrease in pain.  Onset: Full effect within 15 minutes of  injected.  Duration: It will depend on the type of local anesthetic used. On the average, 1 to 8 hours.   From steroids: Decrease in swelling or inflammation. Once inflammation is improved, relief of the pain will follow.  Onset of benefits: Depends on the amount of swelling present. The more swelling, the longer it will take for the benefits to be seen. In some cases, up to 10 days.  Duration: Steroids will stay in the system x 2 weeks. Duration of benefits will depend on multiple posibilities including persistent irritating factors.  From procedure: Some discomfort is to be expected once the numbing medicine wears off. This should be minimal if ice and heat are applied as instructed. Call if:  You experience numbness and weakness that gets worse with time, as opposed to wearing off.  New onset bowel or bladder incontinence. (Spinal procedures only)  Emergency Numbers:  Durning business hours (Monday - Thursday, 8:00 AM - 4:00 PM) (Friday, 9:00 AM - 12:00 Noon): [phone]  After hours: [phone] ____________________________________________________________________________________________  ____________________________________________________________________________________________  Preparing for Procedure with Sedation Instructions: . Oral Intake: Do not eat or drink anything for at least 8 hours prior to your procedure. . Transportation: Public transportation is not allowed. Bring an adult driver. The driver must be physically present in our waiting room before any procedure can be started. . Physical Assistance: Bring an adult physically capable of assisting you, in the event you need help. This adult should keep you company at home for at least 6 hours after the procedure. . Blood Pressure Medicine: Take your blood pressure medicine with a sip of water the morning of the procedure. . Blood   thinners:  . Diabetics on insulin: Notify the staff so that you can be scheduled  1st case in the morning. If your diabetes requires high dose insulin, take only  of your normal insulin dose the morning of the procedure and notify the staff that you have done so. . Preventing infections: Shower with an antibacterial soap the morning of your procedure. . Build-up your immune system: Take 1000 mg of Vitamin C with every meal (3 times a day) the day prior to your procedure. . Antibiotics: Inform the staff if you have a condition or reason that requires you to take antibiotics before dental procedures. . Pregnancy: If you are pregnant, call and cancel the procedure. . Sickness: If you have a cold, fever, or any active infections, call and cancel the procedure. . Arrival: You must be in the facility at least 30 minutes prior to your scheduled procedure. . Children: Do not bring children with you. . Dress appropriately: Bring dark clothing that you would not mind if they get stained. . Valuables: Do not bring any jewelry or valuables. Procedure appointments are reserved for interventional treatments only. . No Prescription Refills. . No medication changes will be discussed during procedure appointments. . No disability issues will be discussed. ____________________________________________________________________________________________   

## 2017-07-22 NOTE — Progress Notes (Signed)
Patient's Name: Dana Bradley  MRN: [id number]  Referring Provider: Vevelyn Francois, NP  DOB: [birth year]  PCP: Arnetha Courser, MD  DOS: 07/22/2017  Note by: Gaspar Cola, MD  Service setting: Ambulatory outpatient  Specialty: Interventional Pain Management  Patient type: Established  Location: [facility] (AMB) Pain Management Facility  Visit type: Interventional Procedure   Primary Reason for Visit: Interventional Pain Management Treatment. CC: Shoulder Pain (left)  Procedure:  Anesthesia, Analgesia, Anxiolysis:  Type: Trigger Point Injection (1-2 muscle groups) CPT: 20552 Region:Posterolateral Cervicothoracic Level: Cervico-thoracic Laterality: Left-Sided    Type: Local Anesthesia Local Anesthetic: Lidocaine 1% Route: Infiltration (/IM) IV Access: Declined Sedation: Declined  Indication(s): Analgesia           Indications: 1. Myofascial pain syndrome (Left) (trapezius muscle)   2. Chronic neck pain   3. Fibromyalgia   4. Chronic occipital neuralgia (Location of Tertiary source of pain) (Bilateral) (L>R)    Pain Score: Pre-procedure: 3 /10 Post-procedure: 3 /10  Pre-op Assessment:  Dana Bradley is a 46 y.o. (year old), female patient, seen today for interventional treatment. She  has a past surgical history that includes Ablation; Tubal ligation (10/01/99); Cardiac catheterization; Knee arthroscopy; Colonoscopy with propofol (N/A, 05/17/2015); Esophagogastroduodenoscopy (N/A, 05/17/2015); and spg. Dana Bradley has a current medication list which includes the following prescription(s): acetaminophen, alprazolam, atorvastatin, calcium-vitamin d-vitamin k, cyanocobalamin, hydroxyzine, magnesium, metoprolol tartrate, omega-3 acid ethyl esters, pregabalin, valerian, baclofen, diclofenac sodium, and hydrocodone-acetaminophen. Her primarily concern today is the Shoulder Pain (left)  She is currently having pain over her left trapezius border with referred pain towards her left jaw, ear, and  distribution of the greater and lesser occipital nerves. She also has some pain in the shoulder. She comes in today with photophobia using dark classes. The plan today is to 2 trigger points of the trapezius muscle and observed to see if this helps her neck pain and headaches. If it does, it would suggest that the pain is referred and the possible mechanism for the occipital neuralgia secondary to possible nerve entrapment as it comes out of the area of the muscle. Alternatively, the greater occipital neuralgia could be secondary to upper cervical spondylosis. In the past she used to get occipital nerve blocks with good results, but at some point in the past, they stopped helping her.  Initial Vital Signs: Blood pressure 122/78, pulse 97, temperature 98.4 F (36.9 C), temperature source Oral, resp. rate 18, height 5\' 3"  (1.6 m), weight 218 lb (98.9 kg), last menstrual period 06/24/2017, SpO2 97 %. BMI: Estimated body mass index is 38.62 kg/m as calculated from the following:   Height as of this encounter: 5\' 3"  (1.6 m).   Weight as of this encounter: 218 lb (98.9 kg).  Risk Assessment: Allergies: Reviewed. She is allergic to aspirin; cymbalta [duloxetine hcl]; demerol [meperidine]; depakote [divalproex sodium]; haloperidol; iodinated diagnostic agents; reglan [metoclopramide]; tramadol hcl; trazodone; compazine [prochlorperazine]; meloxicam; penicillins; tomato; shellfish-derived products; bacitracin-neomycin-polymyxin; cephalosporins; ibuprofen; latex; neosporin [neomycin-bacitracin zn-polymyx]; nsaids; and sulfonamide derivatives.  Allergy Precautions: None required Coagulopathies: Reviewed. None identified.  Blood-thinner therapy: None at this time Active Infection(s): Reviewed. None identified. Dana Bradley is afebrile  Site Confirmation: Dana Bradley was asked to confirm the procedure and laterality before marking the site Procedure checklist: Completed Consent: Before the procedure and under the  influence of no sedative(s), amnesic(s), or anxiolytics, the patient was informed of the treatment options, risks and possible complications. To fulfill our ethical and legal obligations, as recommended by the [employer]'s Code of Ethics, I have informed the patient of my clinical impression; the nature and purpose of the treatment or procedure; the risks, benefits, and possible complications of the intervention; the alternatives, including doing nothing; the risk(s) and benefit(s) of the alternative treatment(s) or procedure(s); and the risk(s) and benefit(s) of doing nothing. The patient was provided information about the general risks and possible complications associated with the procedure. These may include, but are not limited to: failure to achieve desired goals, infection, bleeding, organ or nerve damage, allergic reactions, paralysis, and death. In addition, the patient was informed of those risks and complications associated to the procedure, such as failure to decrease pain; infection; bleeding; organ or nerve damage with subsequent damage to sensory, motor, and/or autonomic systems, resulting in permanent pain, numbness, and/or weakness of one or several areas of the body; allergic reactions; (i.e.: anaphylactic reaction); and/or death. Furthermore, the patient was informed of those risks and complications associated with the medications. These include, but are not limited to: allergic reactions (i.e.: anaphylactic or anaphylactoid reaction(s)); adrenal axis suppression; blood sugar elevation that in diabetics may result in ketoacidosis or comma; water retention that in patients with history of congestive heart failure may result in shortness of breath, pulmonary edema, and decompensation with resultant heart failure; weight gain; swelling or edema; medication-induced neural toxicity; particulate matter embolism and blood vessel occlusion with resultant organ, and/or nervous system  infarction; and/or aseptic necrosis of one or more joints. Finally, the patient was informed that Medicine is not an exact science; therefore, there is also the possibility of unforeseen or unpredictable risks and/or possible complications that may result in a catastrophic outcome. The patient indicated having understood very clearly. We have given the patient no guarantees and we have made no promises. Enough time was given to the patient to ask questions, all of which were answered to the patient's satisfaction. Ms. Cavenaugh has indicated that she wanted to continue with the procedure. Attestation: I, the ordering provider, attest that I have discussed with the patient the benefits, risks, side-effects, alternatives, likelihood of achieving goals, and potential problems during recovery for the procedure that I have provided informed consent. Date: 07/22/2017; Time: 1:41 PM  Pre-Procedure Preparation:  Monitoring: As per clinic protocol. Respiration, ETCO2, SpO2, BP, heart rate and rhythm monitor placed and checked for adequate function Safety Precautions: Patient was assessed for positional comfort and pressure points before starting the procedure. Time-out: I initiated and conducted the "Time-out" before starting the procedure, as per protocol. The patient was asked to participate by confirming the accuracy of the "Time Out" information. Verification of the correct person, site, and procedure were performed and confirmed by me, the nursing staff, and the patient. "Time-out" conducted as per Joint Commission's Universal Protocol (UP.01.01.01). "Time-out" Date & Time: 07/22/2017; 1430 hrs.  Description of Procedure Process:   Position: Sitting Target Area: Trigger Point on the left lateral aspect of the trapezius muscle Approach: Ipsilateral approach. Area Prepped: [place] Region Prepping solution: ChloraPrep (2% chlorhexidine gluconate and 70% isopropyl alcohol) Safety  Precautions: Aspiration looking for blood return was conducted prior to all injections. At no point did we inject any substances, as a needle was being advanced. No attempts were made at seeking any paresthesias. Safe injection practices and needle disposal techniques used. Medications properly checked for expiration dates. SDV (single dose vial) medications used. Description of the Procedure: Protocol guidelines were followed. The patient was placed in position over the fluoroscopy table. The target area was identified  and the area prepped in the usual manner. Skin desensitized using vapocoolant spray. Skin & deeper tissues infiltrated with local anesthetic. Appropriate amount of time allowed to pass for local anesthetics to take effect. The procedure needles were then advanced to the target area. Proper needle placement secured. Negative aspiration confirmed. Solution injected in intermittent fashion, asking for systemic symptoms every 0.5cc of injectate. The needles were then removed and the area cleansed, making sure to leave some of the prepping solution back to take advantage of its long term bactericidal properties. Vitals:   07/22/17 1318 07/22/17 1438  BP: 122/78 118/80  Pulse: 97 74  Resp: 18   Temp: 98.4 F (36.9 C)   TempSrc: Oral   SpO2: 97% 98%  Weight: 218 lb (98.9 kg)   Height: 5\' 3"  (1.6 m)     Start Time: 1432 hrs. End Time: 1434 hrs. Materials:  Needle(s) Type: Epidural needle Gauge: 25G Length: 1.5-in Medication(s): We administered lidocaine, triamcinolone acetonide, and ropivacaine (PF) 2 mg/mL (0.2%). Please see chart orders for dosing details.  Imaging Guidance:  Type of Imaging Technique: None used Indication(s): N/A Exposure Time: No patient exposure Contrast: None used. Fluoroscopic Guidance: N/A Ultrasound Guidance: N/A Interpretation: N/A  Antibiotic Prophylaxis:  Indication(s): None identified Antibiotic given: None  Post-operative Assessment:  EBL:  None Complications: No immediate post-treatment complications observed by team, or reported by patient. Note: The patient tolerated the entire procedure well. A repeat set of vitals were taken after the procedure and the patient was kept under observation following institutional policy, for this type of procedure. Post-procedural neurological assessment was performed, showing return to baseline, prior to discharge. The patient was provided with post-procedure discharge instructions, including a section on how to identify potential problems. Should any problems arise concerning this procedure, the patient was given instructions to immediately contact us, at any time, without hesitation. In any case, we plan to contact the patient by telephone for a follow-up status report regarding this interventional procedure. Comments:  No additional relevant information.  Plan of Care   Imaging Orders  No imaging studies ordered today    Procedure Orders     TRIGGER POINT INJECTION     GREATER OCCIPITAL NERVE BLOCK  Medications ordered for procedure: Meds ordered this encounter  Medications  . lidocaine (XYLOCAINE) 2 % (with pres) injection 200 mg  . triamcinolone acetonide (KENALOG-40) injection 40 mg  . ropivacaine (PF) 2 mg/mL (0.2%) (NAROPIN) injection 9 mL   Medications administered: We administered lidocaine, triamcinolone acetonide, and ropivacaine (PF) 2 mg/mL (0.2%).  See the medical record for exact dosing, route, and time of administration.  New Prescriptions   No medications on file   Disposition: Discharge home  Discharge Date & Time: 07/22/2017; 1445 hrs.   Physician-requested Follow-up: Return for post-procedure eval by Dr. Dossie Arbour in 2 wks. Future Appointments Date Time Provider [place]  08/11/2017 8:15 AM Milinda Pointer, MD [facility] None  08/12/2017 9:00 AM Arnetha Courser, MD [place] [facility]  10/14/2017 8:30 AM Vevelyn Francois, NP [facility] None   Primary Care  Physician: Arnetha Courser, MD Location: [facility] Note by: Gaspar Cola, MD Date: 07/22/2017; Time: 2:50 PM  Disclaimer:  Medicine is not an Chief Strategy Officer. The only guarantee in medicine is that nothing is guaranteed. It is important to note that the decision to proceed with this intervention was based on the information collected from the patient. The Data and conclusions were drawn from the patient's questionnaire, the  interview, and the physical examination. Because the information was provided in large part by the patient, it cannot be guaranteed that it has not been purposely or unconsciously manipulated. Every effort has been made to obtain as much relevant data as possible for this evaluation. It is important to note that the conclusions that lead to this procedure are derived in large part from the available data. Always take into account that the treatment will also be dependent on availability of resources and existing treatment guidelines, considered by other Pain Management Practitioners as being common knowledge and practice, at the time of the intervention. For Medico-Legal purposes, it is also important to point out that variation in procedural techniques and pharmacological choices are the acceptable norm. The indications, contraindications, technique, and results of the above procedure should only be interpreted and judged by a Board-Certified Interventional Pain Specialist with extensive familiarity and expertise in the same exact procedure and technique.

## 2017-07-23 ENCOUNTER — Telehealth: Payer: Self-pay | Admitting: *Deleted

## 2017-07-23 NOTE — Telephone Encounter (Signed)
No problems post procedure. 

## 2017-08-07 NOTE — Progress Notes (Signed)
Patient's Name: Dana Bradley  MRN: [id number]  Referring Provider: Arnetha Courser, MD  DOB: 23-Aug-1971  PCP: Arnetha Courser, MD  DOS: 08/11/2017  Note by: Gaspar Cola, MD  Service setting: Ambulatory outpatient  Specialty: Interventional Pain Management  Location: [facility] (AMB) Pain Management Facility    Patient type: Established   Primary Reason(s) for Visit: Encounter for prescription drug management & post-procedure evaluation of chronic illness with mild to moderate exacerbation(Level of risk: moderate) CC: Neck Pain  HPI  Dana Bradley is a 46 y.o. year old, female patient, who comes today for a post-procedure evaluation and medication management. She has Depression, major, recurrent, in remission ([place]); Hypertension, benign essential, goal below 140/90; History of PSVT (paroxysmal supraventricular tachycardia); GERD; Obstructive sleep apnea, adult; Menorrhagia; Cervico-occipital neuralgia; DDD (degenerative disc disease), lumbosacral; Paroxysmal supraventricular tachycardia ([place]); Chronic pain of multiple joints; Chronic superficial gastritis; Degeneration of intervertebral disc of lumbosacral region; Fibromyalgia; Insomnia; Migraine without aura and with status migrainosus, not intractable; Chronic lower extremity cramps (Bilateral); Obesity; GAD (generalized anxiety disorder); Fatigue; Atypical lymphocytosis; Vitamin D insufficiency; Panic disorder with agoraphobia; Depression, unspecified depression type; Chronic pain syndrome; Long term prescription opiate use; Opiate use; Long term prescription benzodiazepine use; Neurogenic pain; Chronic low back pain (Primary Source of Pain) (Bilateral) (R>L) (midline); Chronic upper back pain (Secondary source of pain) (Bilateral) (L>R); Chronic abdominal pain (Right lower quadrant); Thoracic radiculitis (Right) (T11 dermatome); Chronic occipital neuralgia [facility] source of pain) (Bilateral) (L>R); Chronic neck pain; Chronic cervical radicular pain  (Bilateral) (L>R); Chronic shoulder blade pain (Bilateral) (L>R); Chronic upper extremity pain (Bilateral) (R>L); Chronic knee pain (Bilateral) (R>L); Chronic ankle pain (Bilateral); Cervical spondylosis with myelopathy and radiculopathy; Nephrolithiasis; Controlled substance agreement signed; Plantar fasciitis of left foot; Vitamin B12 deficiency; Hyperlipidemia; Medication monitoring encounter; Intractable headache; Bilateral leg edema; Bright red rectal bleeding; Hypertriglyceridemia; Chronic left hip pain; Chronic sacroiliac joint pain (Left); Lumbar facet joint syndrome (B) (L>R); Left lumbar radiculitis; Lumbar spondylosis; Migraine headache; Muscle spasticity; Osteoarthritis of shoulder (B); Lumbar L1-2 disc protrusion (Right); Positive D-dimer; and Myofascial pain syndrome (Left) (trapezius muscle) on their problem list. Her primarily concern today is the Neck Pain  Pain Assessment: Location: Left, Right(worse on left) Neck Radiating: head and left shoulder Onset: More than a month ago Duration: Chronic pain Quality: Tightness, Pressure(crushed) Severity: 3 /10 (self-reported pain score)  Note: Reported level is compatible with observation.                               Timing: Intermittent Modifying factors: heat, Voltaren  Dana Bradley was last seen on 07/22/2017 for a procedure. During today's appointment we reviewed Dana Bradley's post-procedure results, as well as her outpatient medication regimen.  Further details on both, my assessment(s), as well as the proposed treatment plan, please see below.  Controlled Substance Pharmacotherapy Assessment REMS (Risk Evaluation and Mitigation Strategy)  Analgesic: Hydrocodone/APAP 7.5/325 one daily MME/Bradley:7.23m/Bradley.   WLandis Martins RN  08/11/2017  8:15 AM  Sign at close encounter Nursing Pain Medication Assessment:  Safety precautions to be maintained throughout the outpatient stay will include: orient to surroundings, keep bed in low  position, maintain call bell within reach at all times, provide assistance with transfer out of bed and ambulation.  Medication Inspection Compliance: Pill count conducted under aseptic conditions, in front of the patient. Neither the pills nor the bottle was removed from the patient's sight at any time. Once count was completed  pills were immediately returned to the patient in their original bottle.  Medication: Hydrocodone/APAP Pill/Patch Count: 13 of 30 pills remain Pill/Patch Appearance: Markings consistent with prescribed medication Bottle Appearance: Standard pharmacy container. Clearly labeled. Filled Date:03/28/ 2018 Last Medication intake:  Today   Pharmacokinetics: Liberation and absorption (onset of action): WNL Distribution (time to peak effect): WNL Metabolism and excretion (duration of action): WNL         Pharmacodynamics: Desired effects: Analgesia: Dana Bradley reports >50% benefit. Functional ability: Patient reports that medication allows her to accomplish basic ADLs Clinically meaningful improvement in function (CMIF): Sustained CMIF goals met Perceived effectiveness: Described as relatively effective, allowing for increase in activities of daily living (ADL) Undesirable effects: Side-effects or Adverse reactions: None reported Monitoring: [place] PMP: Online review of the past 60-monthperiod conducted. Compliant with practice rules and regulations Last UDS on record: Summary  Date Value Ref Range Status  04/01/2017 FINAL  Final    Comment:    ==================================================================== TOXASSURE SELECT 13 (MW) ==================================================================== Test                             Result       Flag       Units Drug Present and Declared for Prescription Verification   Hydrocodone                    366          EXPECTED   ng/mg creat   Hydromorphone                  121          EXPECTED   ng/mg creat    Dihydrocodeine                 34           EXPECTED   ng/mg creat   Norhydrocodone                 295          EXPECTED   ng/mg creat    Sources of hydrocodone include scheduled prescription    medications. Hydromorphone, dihydrocodeine and norhydrocodone are    expected metabolites of hydrocodone. Hydromorphone and    dihydrocodeine are also available as scheduled prescription    medications. Drug Absent but Declared for Prescription Verification   Alprazolam                     Not Detected UNEXPECTED ng/mg creat ==================================================================== Test                      Result    Flag   Units      Ref Range   Creatinine              174              mg/dL      >=20 ==================================================================== Declared Medications:  The flagging and interpretation on this report are based on the  following declared medications.  Unexpected results may arise from  inaccuracies in the declared medications.  **Note: The testing scope of this panel includes these medications:  Alprazolam (Xanax)  Hydrocodone (Norco)  **Note: The testing scope of this panel does not include following  reported medications:  Acetaminophen (Norco)  Amitriptyline  Atorvastatin  Baclofen (Lioresal)  Calcium  Cyanocobalamin  Diclofenac  Lamotrigine  Magnesium  Metoprolol (Lopressor)  Omega-3 Fatty Acids (Lovaza)  Pregabalin (Lyrica)  Vitamin D  Vitamin K ==================================================================== For clinical consultation, please call [phone]. ====================================================================    UDS interpretation: Compliant          Medication Assessment Form: Reviewed. Patient indicates being compliant with therapy Treatment compliance: Compliant Risk Assessment Profile: Aberrant behavior: See prior evaluations. None observed or detected today Comorbid factors increasing risk of  overdose: See prior notes. No additional risks detected today Risk of substance use disorder (SUD): Low Opioid Risk Tool - 07/17/17 1109      Family History of Substance Abuse   Alcohol  Positive Female    Illegal Drugs  Negative    Rx Drugs  Negative      Personal History of Substance Abuse   Alcohol  Negative    Illegal Drugs  Negative    Rx Drugs  Negative      Age   Age between 80-45 years   No      History of Preadolescent Sexual Abuse   History of Preadolescent Sexual Abuse  Negative or Female      Psychological Disease   Psychological Disease  Positive    Depression  Positive anxiety   anxiety     Total Score   Opioid Risk Tool Scoring  4    Opioid Risk Interpretation  Moderate Risk      ORT Scoring interpretation table:  Score <3 = Low Risk for SUD  Score between 4-7 = Moderate Risk for SUD  Score >8 = High Risk for Opioid Abuse   Risk Mitigation Strategies:  Patient Counseling: Covered Patient-Prescriber Agreement (PPA): Present and active  Notification to other healthcare providers: Done  Pharmacologic Plan: No change in therapy, at this time  Post-Procedure Assessment  07/22/2017 Procedure: Palliative left trapezius trigger point injection, no fluoroscopy or sedation. Pre-procedure pain score:  3/10 Post-procedure pain score: 3/10 No initial benefit, possible due to rapid discharge after no sedation procedure, without enough time to allow full onset of block. Influential Factors: BMI: 38.97 kg/m Intra-procedural challenges: None observed.         Assessment challenges: None detected.              Reported side-effects: None.        Post-procedural adverse reactions or complications: None reported         Sedation: No sedation used. When no sedatives are used, the analgesic levels obtained are directly associated to the effectiveness of the local anesthetics. However, when sedation is provided, the level of analgesia obtained during the initial 1 hour  following the intervention, is believed to be the result of a combination of factors. These factors may include, but are not limited to: 1. The effectiveness of the local anesthetics used. 2. The effects of the analgesic(s) and/or anxiolytic(s) used. 3. The degree of discomfort experienced by the patient at the time of the procedure. 4. The patients ability and reliability in recalling and recording the events. 5. The presence and influence of possible secondary gains and/or psychosocial factors. Reported result: Relief experienced during the 1st hour after the procedure: 90 % (Ultra-Short Term Relief)            Interpretative annotation: Clinically appropriate result. Analgesia during this period is likely to be Local Anesthetic and/or IV Sedative (Analgesic/Anxiolytic) related.          Effects of local anesthetic: The analgesic effects attained during this period are directly associated to the localized  infiltration of local anesthetics and therefore cary significant diagnostic value as to the etiological location, or anatomical origin, of the pain. Expected duration of relief is directly dependent on the pharmacodynamics of the local anesthetic used. Long-acting (4-6 hours) anesthetics used.  Reported result: Relief during the next 4 to 6 hour after the procedure: 100 % (Short-Term Relief)            Interpretative annotation: Clinically appropriate result. Analgesia during this period is likely to be Local Anesthetic-related.          Long-term benefit: Defined as the period of time past the expected duration of local anesthetics (1 hour for short-acting and 4-6 hours for long-acting). With the possible exception of prolonged sympathetic blockade from the local anesthetics, benefits during this period are typically attributed to, or associated with, other factors such as analgesic sensory neuropraxia, antiinflammatory effects, or beneficial biochemical changes provided by agents other than the  local anesthetics.  Reported result: Extended relief following procedure: 70 % (Long-Term Relief)            Interpretative annotation: Clinically appropriate result. Good relief. No permanent benefit expected. Inflammation plays a part in the etiology to the pain.          Current benefits: Defined as reported results that persistent at this point in time.   Analgesia: <75 % Dana Bradley reports improvement of axial symptoms. Function: Dana Bradley reports improvement in function ROM: Dana Bradley reports improvement in ROM Interpretative annotation: Recurrence of symptoms. No permanent benefit expected. Effective diagnostic intervention.          Interpretation: Results would suggest a successful diagnostic intervention.                  Plan:  Set up procedure as a PRN palliative treatment option for this patient.        Laboratory Chemistry  Inflammation Markers (CRP: Acute Phase) (ESR: Chronic Phase) Lab Results  Component Value Date   CRP 4.5 05/02/2017   ESRSEDRATE 11 04/01/2017                 Renal Function Markers Lab Results  Component Value Date   BUN 13 05/02/2017   CREATININE 0.69 05/02/2017   GFRAA >60 05/02/2017   GFRNONAA >60 05/02/2017                 Hepatic Function Markers Lab Results  Component Value Date   AST 25 04/01/2017   ALT 37 (H) 04/01/2017   ALBUMIN 4.1 04/01/2017   ALKPHOS 84 04/01/2017                 Electrolytes Lab Results  Component Value Date   NA 141 05/02/2017   K 3.6 05/02/2017   CL 105 05/02/2017   CALCIUM 9.6 05/02/2017   MG 2.1 04/01/2017                 Neuropathy Markers Lab Results  Component Value Date   VITAMINB12 606 04/01/2017                 Bone Pathology Markers Lab Results  Component Value Date   ALKPHOS 84 04/01/2017   VD25OH 17 (L) 04/29/2016   25OHVITD1 20 (L) 04/01/2017   25OHVITD2 5.4 04/01/2017   25OHVITD3 15 04/01/2017   CALCIUM 9.6 05/02/2017                 Rheumatology Markers No results found  for: LABURIC  Coagulation Parameters Lab Results  Component Value Date   INR 1.01 04/23/2010   LABPROT 13.2 04/23/2010   PLT 214 05/02/2017                 Cardiovascular Markers Lab Results  Component Value Date   BNP 14.2 05/02/2017   TROPONINI <0.03 05/02/2017   HGB 14.4 05/02/2017   HCT 42.0 05/02/2017                 CA Markers No results found for: CEA, CA125               Note: Lab results reviewed.  Recent Diagnostic Imaging Results  NM PULMONARY VENT AND PERF (V/Q Scan) CLINICAL DATA:  46 year old female with positive D-dimer.  EXAM: NUCLEAR MEDICINE VENTILATION - PERFUSION LUNG SCAN  TECHNIQUE: Ventilation images were obtained in multiple projections using inhaled aerosol Tc-75mDTPA. Perfusion images were obtained in multiple projections after intravenous injection of Tc-971mAA.  RADIOPHARMACEUTICALS:  37.48 mCi Technetium-9984mPA aerosol inhalation and 4.09 mCi Technetium-33m75m IV  COMPARISON:  Chest radiograph dated 05/02/2017  FINDINGS: Ventilation: No focal ventilation defect.  Perfusion: No wedge shaped peripheral perfusion defects to suggest acute pulmonary embolism.  IMPRESSION: Normal study.  Electronically Signed   By: ArasAnner Crete.   On: 05/03/2017 00:14  Complexity Note: Imaging results reviewed. Results shared with Dana Bradley Layman's terms.                         Meds   Current Outpatient Medications:  .  acetaminophen (TYLENOL) 325 MG tablet, Take 650 mg by mouth daily. , Disp: , Rfl:  .  ALPRAZolam (XANAX) 1 MG tablet, Take 1 tablet (1 mg total) by mouth at bedtime as needed for anxiety., Disp: 90 tablet, Rfl: 1 .  atorvastatin (LIPITOR) 10 MG tablet, Take 1 tablet (10 mg total) by mouth at bedtime., Disp: 30 tablet, Rfl: 1 .  Calcium-Vitamin D-Vitamin K 750-161-096-04UNT-MCG TABS, Take 1 tablet by mouth daily. , Disp: , Rfl:  .  Cyanocobalamin (VITAMIN B-12 PO), Take 1,500 mcg by mouth daily.,  Disp: , Rfl:  .  hydrOXYzine (ATARAX/VISTARIL) 50 MG tablet, Take 1 tablet (50 mg total) by mouth every 8 (eight) hours as needed., Disp: 30 tablet, Rfl: 0 .  magnesium 30 MG tablet, Take 300 mg by mouth daily. , Disp: , Rfl:  .  metoprolol tartrate (LOPRESSOR) 50 MG tablet, TAKE 1 TABLET BY MOUTH TWICE A Bradley, Disp: 180 tablet, Rfl: 3 .  omega-3 acid ethyl esters (LOVAZA) 1 g capsule, Take 2 capsules (2 g total) by mouth 2 (two) times daily., Disp: 120 capsule, Rfl: 11 .  pregabalin (LYRICA) 150 MG capsule, Take 1 capsule (150 mg total) by mouth every 8 (eight) hours., Disp: 90 capsule, Rfl: 2 .  Valerian 100 MG CAPS, Take 100 mg by mouth as needed., Disp: , Rfl:  .  baclofen (LIORESAL) 10 MG tablet, Take 1 tablet (10 mg total) 3 (three) times daily by mouth., Disp: 90 tablet, Rfl: 2 .  diclofenac sodium (VOLTAREN) 1 % GEL, Apply 2 g 4 (four) times daily topically., Disp: 1 Tube, Rfl: 2 .  HYDROcodone-acetaminophen (NORCO) 7.5-325 MG tablet, Take 1 tablet every 6 (six) hours as needed by mouth for moderate pain., Disp: 30 tablet, Rfl: 0  ROS  Constitutional: Denies any fever or chills Gastrointestinal: No reported hemesis, hematochezia, vomiting, or acute GI distress Musculoskeletal: Denies any acute  onset joint swelling, redness, loss of ROM, or weakness Neurological: No reported episodes of acute onset apraxia, aphasia, dysarthria, agnosia, amnesia, paralysis, loss of coordination, or loss of consciousness  Allergies  Dana Bradley is allergic to aspirin; cymbalta [duloxetine hcl]; demerol [meperidine]; depakote [divalproex sodium]; haloperidol; iodinated diagnostic agents; reglan [metoclopramide]; tramadol hcl; trazodone; compazine [prochlorperazine]; meloxicam; penicillins; tomato; shellfish-derived products; bacitracin-neomycin-polymyxin; cephalosporins; ibuprofen; latex; neosporin [neomycin-bacitracin zn-polymyx]; nsaids; and sulfonamide derivatives.  PFSH  Drug: Dana Bradley  reports that she  does not use drugs. Alcohol:  reports that she does not drink alcohol. Tobacco:  reports that she quit smoking about 24 years ago. Her smoking use included cigarettes. She has a 12.00 pack-year smoking history. she has never used smokeless tobacco. Medical:  has a past medical history of Acute postoperative pain (04/07/2017), Bursitis, Edema leg (05/02/2015), Fibromyalgia, GERD (gastroesophageal reflux disease), IBS (irritable bowel syndrome), Knee pain, bilateral (12/21/2008), Lumbar discitis, Osteoarthritis, Right hand pain (04/10/2015), Sleep apnea, SVT (supraventricular tachycardia) ([place]), Vertigo, and Vitamin D deficiency (05/01/2016). Surgical: Dana Bradley  has a past surgical history that includes Ablation; Tubal ligation (10/01/99); Cardiac catheterization; Knee arthroscopy; spg; COLONOSCOPY WITH PROPOFOL (N/A, 05/17/2015); and ESOPHAGOGASTRODUODENOSCOPY (EGD) (N/A, 05/17/2015). Family: family history includes Alcohol abuse in her father; Alzheimer's disease in her paternal grandmother and unknown relative; Aneurysm in her maternal grandfather; Arthritis in her maternal grandmother; Bipolar disorder in her sister; COPD in her paternal grandfather; Cancer in her mother; Cancer (age of onset: 51) in her maternal grandmother; Depression in her father, mother, sister, and sister; Diabetes in her sister; Heart attack in her paternal grandfather; Heart disease in her father, maternal grandfather, and paternal grandfather; Hyperlipidemia in her maternal grandmother, mother, and sister; Hypertension in her father, maternal grandfather, mother, paternal grandfather, sister, and sister; Polycystic ovary syndrome in her sister; Stroke in her father; Thyroid disease in her maternal grandmother.  Constitutional Exam  General appearance: Well nourished, well developed, and well hydrated. In no apparent acute distress Vitals:   08/11/17 0809  BP: 123/66  Pulse: 84  Resp: 16  Temp: 98.4 F (36.9 C)  TempSrc: Oral   SpO2: 100%  Weight: 220 lb (99.8 kg)  Height: _0  (1.6 m)   BMI Assessment: Estimated body mass index is 38.97 kg/m as calculated from the following:   Height as of this encounter: _1  (1.6 m).   Weight as of this encounter: 220 lb (99.8 kg).  BMI interpretation table: BMI level Category Range association with higher incidence of chronic pain  <18 kg/m2 Underweight   18.5-24.9 kg/m2 Ideal body weight   25-29.9 kg/m2 Overweight Increased incidence by 20%  30-34.9 kg/m2 Obese (Class I) Increased incidence by 68%  35-39.9 kg/m2 Severe obesity (Class II) Increased incidence by 136%  >40 kg/m2 Extreme obesity (Class III) Increased incidence by 254%   BMI Readings from Last 4 Encounters:  08/11/17 38.97 kg/m  07/22/17 38.62 kg/m  07/17/17 38.62 kg/m  06/09/17 38.78 kg/m   Wt Readings from Last 4 Encounters:  08/11/17 220 lb (99.8 kg)  07/22/17 218 lb (98.9 kg)  07/17/17 218 lb (98.9 kg)  06/09/17 218 lb 14.4 oz (99.3 kg)  Psych/Mental status: Alert, oriented x 3 (person, place, & time)       Eyes: PERLA Respiratory: No evidence of acute respiratory distress  Cervical Spine Area Exam  Skin & Axial Inspection: No masses, redness, edema, swelling, or associated skin lesions Alignment: Symmetrical Functional ROM: Improved after treatment      Stability: No instability detected  Muscle Tone/Strength: Functionally intact. No obvious neuro-muscular anomalies detected. Sensory (Neurological): Unimpaired Palpation: No palpable anomalies              Upper Extremity (UE) Exam    Side: Right upper extremity  Side: Left upper extremity  Skin & Extremity Inspection: Skin color, temperature, and hair growth are WNL. No peripheral edema or cyanosis. No masses, redness, swelling, asymmetry, or associated skin lesions. No contractures.  Skin & Extremity Inspection: Skin color, temperature, and hair growth are WNL. No peripheral edema or cyanosis. No masses, redness, swelling, asymmetry,  or associated skin lesions. No contractures.  Functional ROM: Unrestricted ROM          Functional ROM: Unrestricted ROM          Muscle Tone/Strength: Functionally intact. No obvious neuro-muscular anomalies detected.  Muscle Tone/Strength: Functionally intact. No obvious neuro-muscular anomalies detected.  Sensory (Neurological): Unimpaired          Sensory (Neurological): Unimpaired          Palpation: No palpable anomalies              Palpation: No palpable anomalies              Specialized Test(s): Deferred         Specialized Test(s): Deferred          Thoracic Spine Area Exam  Skin & Axial Inspection: No masses, redness, or swelling Alignment: Symmetrical Functional ROM: Unrestricted ROM Stability: No instability detected Muscle Tone/Strength: Functionally intact. No obvious neuro-muscular anomalies detected. Sensory (Neurological): Unimpaired Muscle strength & Tone: No palpable anomalies  Lumbar Spine Area Exam  Skin & Axial Inspection: No masses, redness, or swelling Alignment: Symmetrical Functional ROM: Unrestricted ROM      Stability: No instability detected Muscle Tone/Strength: Functionally intact. No obvious neuro-muscular anomalies detected. Sensory (Neurological): Unimpaired Palpation: No palpable anomalies       Provocative Tests: Lumbar Hyperextension and rotation test: evaluation deferred today       Lumbar Lateral bending test: evaluation deferred today       Patrick's Maneuver: evaluation deferred today                    Gait & Posture Assessment  Ambulation: Unassisted Gait: Relatively normal for age and body habitus Posture: WNL   Lower Extremity Exam    Side: Right lower extremity  Side: Left lower extremity  Skin & Extremity Inspection: Skin color, temperature, and hair growth are WNL. No peripheral edema or cyanosis. No masses, redness, swelling, asymmetry, or associated skin lesions. No contractures.  Skin & Extremity Inspection: Skin color,  temperature, and hair growth are WNL. No peripheral edema or cyanosis. No masses, redness, swelling, asymmetry, or associated skin lesions. No contractures.  Functional ROM: Unrestricted ROM          Functional ROM: Unrestricted ROM          Muscle Tone/Strength: Functionally intact. No obvious neuro-muscular anomalies detected.  Muscle Tone/Strength: Functionally intact. No obvious neuro-muscular anomalies detected.  Sensory (Neurological): Unimpaired  Sensory (Neurological): Unimpaired  Palpation: No palpable anomalies  Palpation: No palpable anomalies   Assessment  Primary Diagnosis & Pertinent Problem List: The primary encounter diagnosis was Myofascial pain syndrome (Left) (trapezius muscle). Diagnoses of Chronic low back pain (Primary Source of Pain) (Bilateral) (R>L) (midline), Chronic upper back pain (Secondary source of pain) (Bilateral) (L>R), Chronic occipital neuralgia (Tertiary source of pain) (Bilateral) (L>R), Chronic pain syndrome, Muscle spasticity, and Osteoarthritis of shoulder (B)  were also pertinent to this visit.  Status Diagnosis  Controlled Controlled Controlled 1. Myofascial pain syndrome (Left) (trapezius muscle)   2. Chronic low back pain (Primary Source of Pain) (Bilateral) (R>L) (midline)   3. Chronic upper back pain (Secondary source of pain) (Bilateral) (L>R)   4. Chronic occipital neuralgia [facility] source of pain) (Bilateral) (L>R)   5. Chronic pain syndrome   6. Muscle spasticity   7. Osteoarthritis of shoulder (B)     Problems updated and reviewed during this visit: Problem  Acute Postoperative Pain (Resolved)   Plan of Care  Pharmacotherapy (Medications Ordered): Meds ordered this encounter  Medications  . HYDROcodone-acetaminophen (NORCO) 7.5-325 MG tablet    Sig: Take 1 tablet every 6 (six) hours as needed by mouth for moderate pain.    Dispense:  30 tablet    Refill:  0    Do not place this medication, or any other prescription from our  practice, on "Automatic Refill". Patient may have prescription filled one Bradley early if pharmacy is closed on scheduled refill date. Do not fill until: 08/11/17 To last until: 09/10/17  . baclofen (LIORESAL) 10 MG tablet    Sig: Take 1 tablet (10 mg total) 3 (three) times daily by mouth.    Dispense:  90 tablet    Refill:  2    Do not place this medication, or any other prescription from our practice, on "Automatic Refill". Patient may have prescription filled one Bradley early if pharmacy is closed on scheduled refill date.  . diclofenac sodium (VOLTAREN) 1 % GEL    Sig: Apply 2 g 4 (four) times daily topically.    Dispense:  1 Tube    Refill:  2    Do not place medication on "Automatic Refill". Fill one Bradley early if pharmacy is closed on scheduled refill date.  This SmartLink is deprecated. Use AVSMEDLIST instead to display the medication list for a patient. Medications administered today: Dana Bradley had no medications administered during this visit.   Procedure Orders     TRIGGER POINT INJECTION Lab Orders  No laboratory test(s) ordered today   Imaging Orders  No imaging studies ordered today   Referral Orders  No referral(s) requested today    Interventional management options: Planned, scheduled, and/or pending:   None at this time.   Considering:   Therapeutic left cervical epidural steroid injection  Palliative left L4-5 lumbar epidural steroid injections  Diagnostic bilateral lumbar facet block  Possible bilateral lumbar facet radiofrequency ablation. Diagnostic thoracic facet block  Possible bilateral thoracic facet radiofrequency ablation. Diagnostic bilateral cervical facet blocks  Possible bilateral cervical facet radiofrequency ablation. Diagnostic bilateral lesser occipital nerve blocks  Diagnostic bilateral C2 and TON nerve block  Possible bilateral occipital nerve radiofrequency ablation. Diagnostic right T10-11 thoracic epidural steroid injection   Diagnostic bilateral suprascapular nerve block  Diagnostic bilateral intra-articular knee joint injection  Possible bilateral intra-articular Hyalgan knee injections.  Diagnostic bilateral Genicular nerve block  Possible bilateral Genicular nerve radiofrequency ablation.    Palliative PRN treatment(s):   Palliative left trapezius muscle trigger point injection  Palliative left L4-5 lumbar epidural steroid injections  Diagnostic bilateral lumbar facet block  Diagnostic thoracic facet block  Palliative Left cervical epidural steroid injections  Diagnostic bilateral cervical facet blocks  Diagnostic bilateral lesser occipital nerve blocks  Diagnostic bilateral C2 and TON nerve block  Diagnostic right T10-11 thoracic epidural steroid injection  Diagnostic bilateral suprascapular nerve block  Diagnostic bilateral intra-articular knee joint injection  Diagnostic  bilateral Genicular nerve block    Provider-requested follow-up: Return for Med-Mgmt (w/ Dionisio David, NP).  Future Appointments  Date Time Provider [place]  08/12/2017  9:00 AM Arnetha Courser, MD [facility]  10/14/2017  8:30 AM Vevelyn Francois, NP [facility] None   Primary Care Physician: Arnetha Courser, MD Location: [facility] Note by: Gaspar Cola, MD Date: 08/11/2017; Time: 9:33 AM

## 2017-08-11 ENCOUNTER — Encounter: Payer: Self-pay | Admitting: Pain Medicine

## 2017-08-11 ENCOUNTER — Other Ambulatory Visit: Payer: Self-pay

## 2017-08-11 ENCOUNTER — Ambulatory Visit: Payer: Medicaid Other | Attending: Pain Medicine | Admitting: Pain Medicine

## 2017-08-11 VITALS — BP 123/66 | HR 84 | Temp 98.4°F | Resp 16 | Ht 63.0 in | Wt 220.0 lb

## 2017-08-11 DIAGNOSIS — F411 Generalized anxiety disorder: Secondary | ICD-10-CM | POA: Diagnosis not present

## 2017-08-11 DIAGNOSIS — I10 Essential (primary) hypertension: Secondary | ICD-10-CM | POA: Insufficient documentation

## 2017-08-11 DIAGNOSIS — M7918 Myalgia, other site: Secondary | ICD-10-CM | POA: Diagnosis not present

## 2017-08-11 DIAGNOSIS — M5412 Radiculopathy, cervical region: Secondary | ICD-10-CM | POA: Insufficient documentation

## 2017-08-11 DIAGNOSIS — M25571 Pain in right ankle and joints of right foot: Secondary | ICD-10-CM | POA: Insufficient documentation

## 2017-08-11 DIAGNOSIS — Z9851 Tubal ligation status: Secondary | ICD-10-CM | POA: Insufficient documentation

## 2017-08-11 DIAGNOSIS — G8929 Other chronic pain: Secondary | ICD-10-CM | POA: Diagnosis not present

## 2017-08-11 DIAGNOSIS — F339 Major depressive disorder, recurrent, unspecified: Secondary | ICD-10-CM | POA: Insufficient documentation

## 2017-08-11 DIAGNOSIS — G894 Chronic pain syndrome: Secondary | ICD-10-CM | POA: Diagnosis not present

## 2017-08-11 DIAGNOSIS — Z79899 Other long term (current) drug therapy: Secondary | ICD-10-CM | POA: Diagnosis not present

## 2017-08-11 DIAGNOSIS — Z880 Allergy status to penicillin: Secondary | ICD-10-CM | POA: Insufficient documentation

## 2017-08-11 DIAGNOSIS — M549 Dorsalgia, unspecified: Secondary | ICD-10-CM | POA: Diagnosis not present

## 2017-08-11 DIAGNOSIS — M5442 Lumbago with sciatica, left side: Secondary | ICD-10-CM

## 2017-08-11 DIAGNOSIS — Z5181 Encounter for therapeutic drug level monitoring: Secondary | ICD-10-CM | POA: Diagnosis present

## 2017-08-11 DIAGNOSIS — M5441 Lumbago with sciatica, right side: Secondary | ICD-10-CM

## 2017-08-11 DIAGNOSIS — M19011 Primary osteoarthritis, right shoulder: Secondary | ICD-10-CM | POA: Diagnosis not present

## 2017-08-11 DIAGNOSIS — M25572 Pain in left ankle and joints of left foot: Secondary | ICD-10-CM | POA: Diagnosis not present

## 2017-08-11 DIAGNOSIS — M62838 Other muscle spasm: Secondary | ICD-10-CM | POA: Diagnosis not present

## 2017-08-11 DIAGNOSIS — E781 Pure hyperglyceridemia: Secondary | ICD-10-CM | POA: Diagnosis not present

## 2017-08-11 DIAGNOSIS — M5414 Radiculopathy, thoracic region: Secondary | ICD-10-CM | POA: Insufficient documentation

## 2017-08-11 DIAGNOSIS — M5481 Occipital neuralgia: Secondary | ICD-10-CM | POA: Insufficient documentation

## 2017-08-11 DIAGNOSIS — Z87891 Personal history of nicotine dependence: Secondary | ICD-10-CM | POA: Insufficient documentation

## 2017-08-11 DIAGNOSIS — Z886 Allergy status to analgesic agent status: Secondary | ICD-10-CM | POA: Diagnosis not present

## 2017-08-11 DIAGNOSIS — R1031 Right lower quadrant pain: Secondary | ICD-10-CM | POA: Diagnosis not present

## 2017-08-11 DIAGNOSIS — M5137 Other intervertebral disc degeneration, lumbosacral region: Secondary | ICD-10-CM | POA: Diagnosis not present

## 2017-08-11 DIAGNOSIS — M25552 Pain in left hip: Secondary | ICD-10-CM | POA: Insufficient documentation

## 2017-08-11 DIAGNOSIS — M19012 Primary osteoarthritis, left shoulder: Secondary | ICD-10-CM

## 2017-08-11 DIAGNOSIS — G4733 Obstructive sleep apnea (adult) (pediatric): Secondary | ICD-10-CM | POA: Insufficient documentation

## 2017-08-11 DIAGNOSIS — Z9889 Other specified postprocedural states: Secondary | ICD-10-CM | POA: Insufficient documentation

## 2017-08-11 DIAGNOSIS — R252 Cramp and spasm: Secondary | ICD-10-CM | POA: Diagnosis not present

## 2017-08-11 MED ORDER — DICLOFENAC SODIUM 1 % TD GEL
2.0000 g | Freq: Four times a day (QID) | TRANSDERMAL | 2 refills | Status: DC
Start: 2017-08-11 — End: 2017-10-14

## 2017-08-11 MED ORDER — HYDROCODONE-ACETAMINOPHEN 7.5-325 MG PO TABS: 1.0000 | ORAL_TABLET | Freq: Four times a day (QID) | ORAL | 0 refills | Status: DC | PRN

## 2017-08-11 MED ORDER — BACLOFEN 10 MG PO TABS: 10.0000 mg | ORAL_TABLET | Freq: Three times a day (TID) | ORAL | 2 refills | Status: DC

## 2017-08-11 NOTE — Progress Notes (Signed)
Nursing Pain Medication Assessment:  Safety precautions to be maintained throughout the outpatient stay will include: orient to surroundings, keep bed in low position, maintain call bell within reach at all times, provide assistance with transfer out of bed and ambulation.  Medication Inspection Compliance: Pill count conducted under aseptic conditions, in front of the patient. Neither the pills nor the bottle was removed from the patient's sight at any time. Once count was completed pills were immediately returned to the patient in their original bottle.  Medication: Hydrocodone/APAP Pill/Patch Count: 13 of 30 pills remain Pill/Patch Appearance: Markings consistent with prescribed medication Bottle Appearance: Standard pharmacy container. Clearly labeled. Filled Date:03/28/ 2018 Last Medication intake:  Today

## 2017-08-12 ENCOUNTER — Encounter: Payer: Self-pay | Admitting: Family Medicine

## 2017-08-12 ENCOUNTER — Ambulatory Visit: Payer: Medicaid Other | Admitting: Family Medicine

## 2017-08-12 DIAGNOSIS — G43001 Migraine without aura, not intractable, with status migrainosus: Secondary | ICD-10-CM | POA: Diagnosis not present

## 2017-08-12 DIAGNOSIS — E781 Pure hyperglyceridemia: Secondary | ICD-10-CM

## 2017-08-12 DIAGNOSIS — F4001 Agoraphobia with panic disorder: Secondary | ICD-10-CM | POA: Diagnosis not present

## 2017-08-12 DIAGNOSIS — M797 Fibromyalgia: Secondary | ICD-10-CM

## 2017-08-12 DIAGNOSIS — Z79891 Long term (current) use of opiate analgesic: Secondary | ICD-10-CM | POA: Diagnosis not present

## 2017-08-12 MED ORDER — ICOSAPENT ETHYL 1 G PO CAPS: 2.0000 | ORAL_CAPSULE | Freq: Two times a day (BID) | ORAL | 11 refills | Status: DC

## 2017-08-12 MED ORDER — ONDANSETRON HCL 4 MG PO TABS: 4.0000 mg | ORAL_TABLET | Freq: Three times a day (TID) | ORAL | 2 refills | Status: DC | PRN

## 2017-08-12 NOTE — Patient Instructions (Addendum)
Ask about injectable migraine medicine for the headaches Return fasting for labs  12 Ways to Curb Anxiety  ?Anxiety is normal human sensation. It is what helped our ancestors survive the pitfalls of the wilderness. Anxiety is defined as experiencing worry or nervousness about an imminent event or something with an uncertain outcome. It is a feeling experienced by most people at some point in their lives. Anxiety can be triggered by a very personal issue, such as the illness of a loved one, or an event of global proportions, such as a refugee crisis. Some of the symptoms of anxiety are:  Feeling restless.  Having a feeling of impending danger.  Increased heart rate.  Rapid breathing. Sweating.  Shaking.  Weakness or feeling tired.  Difficulty concentrating on anything except the current worry.  Insomnia.  Stomach or bowel problems. What can we do about anxiety we may be feeling? There are many techniques to help manage stress and relax. Here are 12 ways you can reduce your anxiety almost immediately: 1. Turn off the constant feed of information. Take a social media sabbatical. Studies have shown that social media directly contributes to social anxiety.  2. Monitor your television viewing habits. Are you watching shows that are also contributing to your anxiety, such as 24-hour news stations? Try watching something else, or better yet, nothing at all. Instead, listen to music, read an inspirational book or practice a hobby. 3. Eat nutritious meals. Also, don't skip meals and keep healthful snacks on hand. Hunger and poor diet contributes to feeling anxious. 4. Sleep. Sleeping on a regular schedule for at least seven to eight hours a night will do wonders for your outlook when you are awake. 5. Exercise. Regular exercise will help rid your body of that anxious energy and help you get more restful sleep. 6. Try deep (diaphragmatic) breathing. Inhale slowly through your nose for five seconds and  exhale through your mouth. 7. Practice acceptance and gratitude. When anxiety hits, accept that there are things out of your control that shouldn't be of immediate concern.  8. Seek out humor. When anxiety strikes, watch a funny video, read jokes or call a friend who makes you laugh. Laughter is healing for our bodies and releases endorphins that are calming. 9. Stay positive. Take the effort to replace negative thoughts with positive ones. Try to see a stressful situation in a positive light. Try to come up with solutions rather than dwelling on the problem. 10. Figure out what triggers your anxiety. Keep a journal and make note of anxious moments and the events surrounding them. This will help you identify triggers you can avoid or even eliminate. 11. Talk to someone. Let a trusted friend, family member or even trained professional know that you are feeling overwhelmed and anxious. Verbalize what you are feeling and why.  12. Volunteer. If your anxiety is triggered by a crisis on a large scale, become an advocate and work to resolve the problem that is causing you unease. Anxiety is often unwelcome and can become overwhelming. If not kept in check, it can become a disorder that could require medical treatment. However, if you take the time to care for yourself and avoid the triggers that make you anxious, you will be able to find moments of relaxation and clarity that make your life much more enjoyable.   Panic Attacks Panic attacks are sudden, short feelings of great fear or discomfort. You may have them for no reason when you are relaxed, when  you are uneasy (anxious), or when you are sleeping. Follow these instructions at home:  Take all your medicines as told.  Check with your doctor before starting new medicines.  Keep all doctor visits. Contact a doctor if:  You are not able to take your medicines as told.  Your symptoms do not get better.  Your symptoms get worse. Get help right  away if:  Your attacks seem different than your normal attacks.  You have thoughts about hurting yourself or others.  You take panic attack medicine and you have a side effect. This information is not intended to replace advice given to you by your health care provider. Make sure you discuss any questions you have with your health care provider. Document Released: 10/19/2010 Document Revised: 02/22/2016 Document Reviewed: 04/30/2013 Elsevier Interactive Patient Education  2017 [employer].

## 2017-08-12 NOTE — Assessment & Plan Note (Addendum)
Refer to psychiatrist; I do not plan to prescribe benzodiazepine for this and will defer that decision and prescription to a psychiatrist

## 2017-08-12 NOTE — Progress Notes (Signed)
BP 116/70 (BP Location: Left Arm, Patient Position: Sitting, Cuff Size: Large)   Pulse 97   Temp 98.2 F (36.8 C) (Oral)   Ht 5\' 3"  (1.6 m)   Wt 223 lb 4.8 oz (101.3 kg)   LMP 07/22/2017   SpO2 98%   BMI 39.56 kg/m    Subjective:    Patient ID: Dana Bradley, female    DOB: 1971-07-12, 46 y.o.   MRN: [id number]  HPI: Dana Bradley is a 46 y.o. female  Chief Complaint  Patient presents with  . Panic Attack  . Depression  . Fatigue    Falls asleep while driving, can't do daily activites     HPI She is here for f/u; she has migraines and needs refill of the zofran She has panic attacks; she asked for a refill of xanax; she does not drive very often because she falls asleep so often; she is having so many panic attacks, can't breathe and has tears; was seeing a counselor; not seeing psychiatrist right now Fish oil causes fish burps; she is not taking Lovaza two pills twice a day, taking the full dose makes her sick; foul taste; has not tried vascepa; taking atorvastatin every day She is still bothered by so much fatigue and pain; if she is in a warm room, she will fall asleep; she could be having a conversation and fall asleep; years ago, tested for narcolepsy (did not have); she just has massive fatigue; seeing Dr. Manuella Ghazi next week; husband has to wash her hair sometimes; hurts to wash her hair; may take a week in between showering because she is that tired sometimes, maybe once a month at that extreme; she cares about her hygiene; gets winded brushing her teeth; she tried to seek disability for fibromyalgia; uses a cane or walker; uses walker 4-5 days a month; she can't go places where she would have to walk, like going to the zoo with her children; she misses social activities; she will sometimes even pass on going to the movies with her family because of fatigue and ease of falling asleep or getting a headache Migraine headache, lights dark today (she is wearing sunglasses) High chol;  oatmeal with pecans and teaspoon of brown sugar; will come back another day  Depression screen [facility] 2/9 08/12/2017 08/11/2017 07/22/2017 07/17/2017 04/15/2017  Decreased Interest 1 0 0 0 0  Down, Depressed, Hopeless 1 0 0 0 1  PHQ - 2 Score 2 0 0 0 1  Altered sleeping 3 - - - -  Tired, decreased energy 3 - - - -  Change in appetite 1 - - - -  Feeling bad or failure about yourself  0 - - - -  Trouble concentrating 0 - - - -  Moving slowly or fidgety/restless 0 - - - -  Suicidal thoughts 0 - - - -  PHQ-9 Score 9 - - - -  Difficult doing work/chores Somewhat difficult - - - -    Relevant past medical, surgical, family and social history reviewed Past Medical History:  Diagnosis Date  . Acute postoperative pain 04/07/2017  . Bursitis   . Edema leg 05/02/2015  . Fibromyalgia   . GERD (gastroesophageal reflux disease)   . IBS (irritable bowel syndrome)   . Knee pain, bilateral 12/21/2008   Qualifier: Diagnosis of  By: Hassell Done FNP, Tori Milks    . Lumbar discitis   . Osteoarthritis   . Right hand pain 04/10/2015   [facility] has done nerve  conduction studies and ruled out carpal tunnel.   . Sleep apnea   . SVT (supraventricular tachycardia) ([place])   . Vertigo   . Vitamin D deficiency 05/01/2016   Past Surgical History:  Procedure Laterality Date  . ABLATION     Uterine  . CARDIAC CATHETERIZATION    . COLONOSCOPY WITH PROPOFOL N/A 05/17/2015   Procedure: COLONOSCOPY WITH PROPOFOL;  Surgeon: Manya Silvas, MD;  Location: [facility] ENDOSCOPY;  Service: Endoscopy;  Laterality: N/A;  . ESOPHAGOGASTRODUODENOSCOPY N/A 05/17/2015   Procedure: ESOPHAGOGASTRODUODENOSCOPY (EGD);  Surgeon: Manya Silvas, MD;  Location[facility] ENDOSCOPY;  Service: Endoscopy;  Laterality: N/A;  . KNEE ARTHROSCOPY    . spg     6/18  . TUBAL LIGATION  10/01/99   Family History  Problem Relation Age of Onset  . Depression Mother   . Hypertension Mother   . Cancer Mother        Skin  . Hyperlipidemia Mother    . Alcohol abuse Father   . Depression Father   . Stroke Father   . Heart disease Father   . Hypertension Father   . Depression Sister   . Hyperlipidemia Sister   . Diabetes Sister   . Hypertension Sister   . Polycystic ovary syndrome Sister   . Bipolar disorder Sister   . Cancer Maternal Grandmother 105       Breast  . Thyroid disease Maternal Grandmother   . Arthritis Maternal Grandmother   . Hyperlipidemia Maternal Grandmother   . Depression Sister   . Hypertension Sister   . Alzheimer's disease Unknown   . Aneurysm Maternal Grandfather   . Hypertension Maternal Grandfather   . Heart disease Maternal Grandfather   . Alzheimer's disease Paternal Grandmother   . Heart attack Paternal Grandfather   . Hypertension Paternal Grandfather   . COPD Paternal Grandfather   . Heart disease Paternal Grandfather   . Bladder Cancer Neg Hx   . Kidney cancer Neg Hx    Social History   Socioeconomic History  . Marital status: Married    Spouse name: Not on file  . Number of children: Not on file  . Years of education: Not on file  . Highest education level: Not on file  Social Needs  . Financial resource strain: Not on file  . Food insecurity - worry: Not on file  . Food insecurity - inability: Not on file  . Transportation needs - medical: Not on file  . Transportation needs - non-medical: Not on file  Occupational History  . Not on file  Tobacco Use  . Smoking status: Former Smoker    Packs/day: 4.00    Years: 3.00    Pack years: 12.00    Types: Cigarettes    Last attempt to quit: 12/10/1992    Years since quitting: 24.7  . Smokeless tobacco: Never Used  Substance and Sexual Activity  . Alcohol use: No    Alcohol/week: 0.0 oz  . Drug use: No  . Sexual activity: Yes    Partners: Male  Other Topics Concern  . Not on file  Social History Narrative  . Not on file    Interim medical history since last visit reviewed. Allergies and medications reviewed  Review of  Systems Per HPI unless specifically indicated above     Objective:    BP 116/70 (BP Location: Left Arm, Patient Position: Sitting, Cuff Size: Large)   Pulse 97   Temp 98.2 F (36.8 C) (Oral)   Ht  5\' 3"  (1.6 m)   Wt 223 lb 4.8 oz (101.3 kg)   LMP 07/22/2017   SpO2 98%   BMI 39.56 kg/m   Wt Readings from Last 3 Encounters:  08/12/17 223 lb 4.8 oz (101.3 kg)  08/11/17 220 lb (99.8 kg)  07/22/17 218 lb (98.9 kg)    Physical Exam  Constitutional: She appears well-developed and well-nourished.  obese  HENT:  Head: Atraumatic.  Mouth/Throat: Mucous membranes are normal.  Eyes: EOM are normal. No scleral icterus.  Neck: No JVD present.  Cardiovascular: Normal rate, regular rhythm and normal heart sounds. Exam reveals no friction rub.  Pulmonary/Chest: Effort normal and breath sounds normal. She has no rales.  Abdominal: She exhibits no distension.  Musculoskeletal: She exhibits edema (trace NON-pitting).  Tender over pressure point left elbow  Skin: No pallor.  Psychiatric: She has a normal mood and affect. Her behavior is normal. Her mood appears not anxious. She does not exhibit a depressed mood.  Good eye contact with examiner      Assessment & Plan:   Problem List Items Addressed This Visit      Cardiovascular and Mediastinum   Migraine without aura and with status migrainosus, not intractable    Seeing neurologist      Relevant Medications   amitriptyline (ELAVIL) 10 MG tablet     Other   Panic disorder with agoraphobia    Refer to psychiatrist; I do not plan to prescribe benzodiazepine for this and will defer that decision and prescription to a psychiatrist      Relevant Medications   amitriptyline (ELAVIL) 10 MG tablet   Other Relevant Orders   Ambulatory referral to Psychiatry   Long term prescription opiate use (Chronic)    I will not prescribe benzodiazepine for this patient already on an opioid; she is seeing pain clinic for pain medicine       Hypertriglyceridemia    Encouraged healthy eating; check lipids      Fibromyalgia (Chronic)    Patient is bothered by her symptoms; seeing neurologist          Follow up plan: No Follow-up on file.  An after-visit summary was printed and given to the patient at [place].  Please see the patient instructions which may contain other information and recommendations beyond what is mentioned above in the assessment and plan.  Meds ordered this encounter  Medications  . amitriptyline (ELAVIL) 10 MG tablet    Sig: Take 10 mg at bedtime by mouth.   . magnesium oxide (MAG-OX) 400 MG tablet    Sig: Take by mouth.  . DISCONTD: ondansetron (ZOFRAN) 4 MG tablet    Sig: Take 4 mg as needed by mouth.   . Magnesium 300 MG CAPS    Sig: Take 300 mg daily by mouth.  . ondansetron (ZOFRAN) 4 MG tablet    Sig: Take 1 tablet (4 mg total) every 8 (eight) hours as needed by mouth.    Dispense:  20 tablet    Refill:  2  . DISCONTD: Icosapent Ethyl (VASCEPA) 1 g CAPS    Sig: Take 2 capsules 2 (two) times daily by mouth.    Dispense:  120 capsule    Refill:  11    Not tolerating lovaza; switch; we will be glad to seek prior auth if needed    Orders Placed This Encounter  Procedures  . Ambulatory referral to Psychiatry  Face-to-face time with patient was more than 25 minutes, >50% time spent  counseling and coordination of care

## 2017-08-13 ENCOUNTER — Other Ambulatory Visit: Payer: Self-pay

## 2017-08-13 DIAGNOSIS — E782 Mixed hyperlipidemia: Secondary | ICD-10-CM

## 2017-08-13 LAB — LIPID PANEL
CHOL/HDL RATIO: 5.4 (calc) — AB (ref <5.0)
Cholesterol: 189 mg/dL (ref <200)
HDL: 35 mg/dL — AB (ref >50)
NON-HDL CHOLESTEROL (CALC): 154 mg/dL — AB (ref <130)
TRIGLYCERIDES: 490 mg/dL — AB (ref <150)

## 2017-08-14 ENCOUNTER — Other Ambulatory Visit: Payer: Self-pay

## 2017-08-14 ENCOUNTER — Other Ambulatory Visit: Payer: Self-pay | Admitting: Family Medicine

## 2017-08-14 DIAGNOSIS — E781 Pure hyperglyceridemia: Secondary | ICD-10-CM

## 2017-08-14 MED ORDER — GEMFIBROZIL 600 MG PO TABS: 600.0000 mg | ORAL_TABLET | Freq: Two times a day (BID) | ORAL | 3 refills | Status: DC

## 2017-08-14 NOTE — Progress Notes (Signed)
Stop statin Start gemfibrozil

## 2017-08-19 NOTE — Assessment & Plan Note (Signed)
Encouraged healthy eating; check lipids

## 2017-08-19 NOTE — Assessment & Plan Note (Signed)
I will not prescribe benzodiazepine for this patient already on an opioid; she is seeing pain clinic for pain medicine

## 2017-08-19 NOTE — Assessment & Plan Note (Signed)
Seeing neurologist.

## 2017-08-19 NOTE — Assessment & Plan Note (Signed)
Patient is bothered by her symptoms; seeing neurologist

## 2017-08-26 ENCOUNTER — Telehealth: Payer: Self-pay | Admitting: Family Medicine

## 2017-08-26 NOTE — Telephone Encounter (Signed)
okie notified pt

## 2017-08-26 NOTE — Telephone Encounter (Signed)
Copied from [place]. Topic: Quick Communication - See Telephone Encounter >> Aug 26, 2017  1:40 PM Burnis Medin, NT wrote: CRM for notification. See Telephone encounter for: Pt is calling to see if she has been diagnosis of CFS. Pt. Also wants the results from her sleep study. Pt. Would like a call back   08/26/17.

## 2017-08-26 NOTE — Telephone Encounter (Signed)
Please ask her to contact her neurologist for the sleep study; it looks like he ordered that I do not see chronic fatigue syndrome as a diagnosis in our record

## 2017-08-29 ENCOUNTER — Ambulatory Visit: Payer: Medicaid Other | Admitting: Psychiatry

## 2017-08-29 ENCOUNTER — Encounter: Payer: Self-pay | Admitting: Psychiatry

## 2017-08-29 ENCOUNTER — Other Ambulatory Visit: Payer: Self-pay

## 2017-08-29 VITALS — BP 124/80 | HR 76 | Temp 97.9°F | Wt 216.8 lb

## 2017-08-29 DIAGNOSIS — F410 Panic disorder [episodic paroxysmal anxiety] without agoraphobia: Secondary | ICD-10-CM | POA: Diagnosis not present

## 2017-08-29 DIAGNOSIS — F4000 Agoraphobia, unspecified: Secondary | ICD-10-CM

## 2017-08-29 DIAGNOSIS — F5105 Insomnia due to other mental disorder: Secondary | ICD-10-CM | POA: Diagnosis not present

## 2017-08-29 DIAGNOSIS — F411 Generalized anxiety disorder: Secondary | ICD-10-CM | POA: Diagnosis not present

## 2017-08-29 DIAGNOSIS — F331 Major depressive disorder, recurrent, moderate: Secondary | ICD-10-CM | POA: Diagnosis not present

## 2017-08-29 MED ORDER — OXCARBAZEPINE 150 MG PO TABS: 150.0000 mg | ORAL_TABLET | Freq: Two times a day (BID) | ORAL | 1 refills | Status: DC

## 2017-08-29 MED ORDER — CLONAZEPAM 0.5 MG PO TABS: 0.5000 mg | ORAL_TABLET | Freq: Two times a day (BID) | ORAL | 0 refills | Status: DC | PRN

## 2017-08-29 MED ORDER — VENLAFAXINE HCL ER 37.5 MG PO CP24: 37.5000 mg | ORAL_CAPSULE | Freq: Every day | ORAL | 1 refills | Status: DC

## 2017-08-29 MED ORDER — ESZOPICLONE 2 MG PO TABS: 2.0000 mg | ORAL_TABLET | Freq: Every evening | ORAL | 0 refills | Status: DC | PRN
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
## 2017-08-29 NOTE — Patient Instructions (Signed)
Clonazepam tablets What is this medicine? CLONAZEPAM (kloe NA ze pam) is a benzodiazepine. It is used to treat certain types of seizures. It is also used to treat panic disorder. This medicine may be used for other purposes; ask your health care provider or pharmacist if you have questions. COMMON BRAND NAME(S): Ceberclon, Klonopin What should I tell my health care provider before I take this medicine? They need to know if you have any of these conditions: -an alcohol or drug abuse problem -bipolar disorder, depression, psychosis or other mental health condition -glaucoma -kidney or liver disease -lung or breathing disease -myasthenia gravis -Parkinson's disease -porphyria -seizures or a history of seizures -suicidal thoughts -an unusual or allergic reaction to clonazepam, other benzodiazepines, foods, dyes, or preservatives -pregnant or trying to get pregnant -breast-feeding How should I use this medicine? Take this medicine by mouth with a glass of water. Follow the directions on the prescription label. If it upsets your stomach, take it with food or milk. Take your medicine at regular intervals. Do not take it more often than directed. Do not stop taking or change the dose except on the advice of your doctor or health care professional. A special MedGuide will be given to you by the pharmacist with each prescription and refill. Be sure to read this information carefully each time. Talk to your pediatrician regarding the use of this medicine in children. Special care may be needed. Overdosage: If you think you have taken too much of this medicine contact a poison control center or emergency room at once. NOTE: This medicine is only for you. Do not share this medicine with others. What if I miss a dose? If you miss a dose, take it as soon as you can. If it is almost time for your next dose, take only that dose. Do not take double or extra doses. What may interact with this medicine? Do  not take this medication with any of the following medicines: -narcotic medicines for cough -sodium oxybate This medicine may also interact with the following medications: -alcohol -antihistamines for allergy, cough and cold -antiviral medicines for HIV or AIDS -certain medicines for anxiety or sleep -certain medicines for depression, like amitriptyline, fluoxetine, sertraline -certain medicines for fungal infections like ketoconazole and itraconazole -certain medicines for seizures like carbamazepine, phenobarbital, phenytoin, primidone -general anesthetics like halothane, isoflurane, methoxyflurane, propofol -local anesthetics like lidocaine, pramoxine, tetracaine -medicines that relax muscles for surgery -narcotic medicines for pain -phenothiazines like chlorpromazine, mesoridazine, prochlorperazine, thioridazine This list may not describe all possible interactions. Give your health care provider a list of all the medicines, herbs, non-prescription drugs, or dietary supplements you use. Also tell them if you smoke, drink alcohol, or use illegal drugs. Some items may interact with your medicine. What should I watch for while using this medicine? Tell your doctor or health care professional if your symptoms do not start to get better or if they get worse. Do not stop taking except on your doctor's advice. You may develop a severe reaction. Your doctor will tell you how much medicine to take. You may get drowsy or dizzy. Do not drive, use machinery, or do anything that needs mental alertness until you know how this medicine affects you. To reduce the risk of dizzy and fainting spells, do not stand or sit up quickly, especially if you are an older patient. Alcohol may increase dizziness and drowsiness. Avoid alcoholic drinks. If you are taking another medicine that also causes drowsiness, you may have more side   effects. Give your health care provider a list of all medicines you use. Your doctor  will tell you how much medicine to take. Do not take more medicine than directed. Call emergency for help if you have problems breathing or unusual sleepiness. The use of this medicine may increase the chance of suicidal thoughts or actions. Pay special attention to how you are responding while on this medicine. Any worsening of mood, or thoughts of suicide or dying should be reported to your health care professional right away. What side effects may I notice from receiving this medicine? Side effects that you should report to your doctor or health care professional as soon as possible: -allergic reactions like skin rash, itching or hives, swelling of the face, lips, or tongue -breathing problems -confusion -loss of balance or coordination -signs and symptoms of low blood pressure like dizziness; feeling faint or lightheaded, falls; unusually weak or tired -suicidal thoughts or mood changes Side effects that usually do not require medical attention (report to your doctor or health care professional if they continue or are bothersome): -dizziness -headache -tiredness -upset stomach This list may not describe all possible side effects. Call your doctor for medical advice about side effects. You may report side effects to FDA at 1-800-FDA-1088. Where should I keep my medicine? Keep out of the reach of children. This medicine can be abused. Keep your medicine in a safe place to protect it from theft. Do not share this medicine with anyone. Selling or giving away this medicine is dangerous and against the law. This medicine may cause accidental overdose and death if taken by other adults, children, or pets. Mix any unused medicine with a substance like cat litter or coffee grounds. Then throw the medicine away in a sealed container like a sealed bag or a coffee can with a lid. Do not use the medicine after the expiration date. Store at room temperature between 15 and 30 degrees C (59 and 86 degrees F).  Protect from light. Keep container tightly closed. NOTE: This sheet is a summary. It may not cover all possible information. If you have questions about this medicine, talk to your doctor, pharmacist, or health care provider.  2018 Else[employer] Standard (2016-02-23 18:46:32) Oxcarbazepine tablets What is this medicine? OXCARBAZEPINE (ox car BAZ e peen) is used to treat people with epilepsy. It helps prevent partial seizures. This medicine may be used for other purposes; ask your health care provider or pharmacist if you have questions. COMMON BRAND NAME(S): Trileptal What should I tell my health care provider before I take this medicine? They need to know if you have any of these conditions: -Asian ancestry -kidney disease -liver disease -suicidal thoughts, plans, or attempt; a previous suicide attempt by you or a family member -any unusual or allergic reaction to oxcarbazepine, carbamazepine, other medicines, foods, dyes, or preservatives -pregnant or trying to get pregnant -breast-feeding How should I use this medicine? Take this medicine by mouth with a glass of water. Follow the directions on the prescription label. This medicine may be taken with or without food. Take your doses at regular intervals. Do not take your medicine more often than directed. Do not stop taking except on the advice of your doctor or health care professional. A special MedGuide will be given to you by the pharmacist with each prescription and refill. Be sure to read this information carefully each time. Talk to your pediatrician regarding the use of this medicine in children. While this medicine may be prescribed  for children as young as 2 years for selected conditions, precautions do apply. Overdosage: If you think you have taken too much of this medicine contact a poison control center or emergency room at once. NOTE: This medicine is only for you. Do not share this medicine with others. What if I miss a  dose? If you miss a dose, take it as soon as you can. If it is almost time for your next dose, take only that dose. Do not take double or extra doses. What may interact with this medicine? Do not take this medicine with any of the following medications: -carbamazepine This medicine may also interact with the following medications: -birth control pills -certain medicines for seizures like phenobarbital, phenytoin, valproic acid -certain medicines for high blood pressure like felodipine, diltiazem, verapamil -cyclosporine This list may not describe all possible interactions. Give your health care provider a list of all the medicines, herbs, non-prescription drugs, or dietary supplements you use. Also tell them if you smoke, drink alcohol, or use illegal drugs. Some items may interact with your medicine. What should I watch for while using this medicine? Visit your doctor or health care professional for regular checks on your progress. Do not stop taking this medicine suddenly. This increases the risk of seizures. Wear a Prob[employer] officer or necklace. Carry an identification card with information about your condition, medications, and doctor or health care professional. Rarely, serious skin allergic reactions may occur with this medicine. If you develop a skin rash, redness, itching, peeling skin inside your mouth, swollen glands, or a fever while taking this medicine, contact your health care provider immediately. You may get drowsy or dizzy. Do not drive, use machinery, or do anything that needs mental alertness until you know how this drug affects you. Do not stand or sit up quickly, especially if you are an older patient. This reduces the risk of dizzy or fainting spells. Alcohol can make you more drowsy and dizzy. Avoid alcoholic drinks. Birth control pills may not work properly while you are taking this medicine. Talk to your doctor about using an extra method of birth control. The use of this  medicine may increase the chance of suicidal thoughts or actions. Pay special attention to how you are responding while on this medicine. Any worsening of mood, or thoughts of suicide or dying should be reported to your health care professional right away. Women who become pregnant while using this medicine may enroll in the [place] Pregnancy Registry by calling [phone]. This registry collects information about the safety of antiepileptic drug use during pregnancy. What side effects may I notice from receiving this medicine? Side effects that you should report to your doctor or health care professional as soon as possible: -allergic reactions such as skin rash or itching, hives, swelling of the lips, mouth, tongue, or throat -changes in vision -confusion -difficulty passing urine or change in the amount of urine -fever -infection -nausea, vomiting -problems with balance, speaking, walking -redness, blistering, peeling or loosening of the skin, including inside the mouth -swelling of feet, hands -unusual bleeding, bruising -unusually weak or tired -worsening of mood, thoughts or actions of suicide or dying -yellowing of eyes, skin Side effects that usually do not require medical attention (report to your doctor or health care professional if they continue or are bothersome): -constipation or diarrhea -headache -loss of appetite -nervous -stomach upset -tremors -trouble sleeping This list may not describe all possible side effects. Call your doctor for medical  advice about side effects. You may report side effects to FDA at 1-800-FDA-1088. Where should I keep my medicine? Keep out of reach of children. Store at room temperature between 15 and 30 degrees C (59 and 86 degrees F). Keep container tightly closed. Throw away any unused medicine after the expiration date. NOTE: This sheet is a summary. It may not cover all possible information. If you have  questions about this medicine, talk to your doctor, pharmacist, or health care provider.  2018 Elsevier/Gold Standard (2013-03-17 15:09:57) Venlafaxine extended-release capsules What is this medicine? VENLAFAXINE(VEN la fax een) is used to treat depression, anxiety and panic disorder. This medicine may be used for other purposes; ask your health care provider or pharmacist if you have questions. COMMON BRAND NAME(S): Effexor XR What should I tell my health care provider before I take this medicine? They need to know if you have any of these conditions: -bleeding disorders -glaucoma -heart disease -high blood pressure -high cholesterol -kidney disease -liver disease -low levels of sodium in the blood -mania or bipolar disorder -seizures -suicidal thoughts, plans, or attempt; a previous suicide attempt by you or a family -take medicines that treat or prevent blood clots -thyroid disease -an unusual or allergic reaction to venlafaxine, desvenlafaxine, other medicines, foods, dyes, or preservatives -pregnant or trying to get pregnant -breast-feeding How should I use this medicine? Take this medicine by mouth with a full glass of water. Follow the directions on the prescription label. Do not cut, crush, or chew this medicine. Take it with food. If needed, the capsule may be carefully opened and the entire contents sprinkled on a spoonful of cool applesauce. Swallow the applesauce/pellet mixture right away without chewing and follow with a glass of water to ensure complete swallowing of the pellets. Try to take your medicine at about the same time each day. Do not take your medicine more often than directed. Do not stop taking this medicine suddenly except upon the advice of your doctor. Stopping this medicine too quickly may cause serious side effects or your condition may worsen. A special MedGuide will be given to you by the pharmacist with each prescription and refill. Be sure to read this  information carefully each time. Talk to your pediatrician regarding the use of this medicine in children. Special care may be needed. Overdosage: If you think you have taken too much of this medicine contact a poison control center or emergency room at once. NOTE: This medicine is only for you. Do not share this medicine with others. What if I miss a dose? If you miss a dose, take it as soon as you can. If it is almost time for your next dose, take only that dose. Do not take double or extra doses. What may interact with this medicine? Do not take this medicine with any of the following medications: -certain medicines for fungal infections like fluconazole, itraconazole, ketoconazole, posaconazole, voriconazole -cisapride -desvenlafaxine -dofetilide -dronedarone -duloxetine -levomilnacipran -linezolid -MAOIs like Carbex, Eldepryl, Marplan, Nardil, and Parnate -methylene blue (injected into a vein) -milnacipran -pimozide -thioridazine -ziprasidone This medicine may also interact with the following medications: -amphetamines -aspirin and aspirin-like medicines -certain medicines for depression, anxiety, or psychotic disturbances -certain medicines for migraine headaches like almotriptan, eletriptan, frovatriptan, naratriptan, rizatriptan, sumatriptan, zolmitriptan -certain medicines for sleep -certain medicines that treat or prevent blood clots like dalteparin, enoxaparin, warfarin -cimetidine -clozapine -diuretics -fentanyl -furazolidone -indinavir -isoniazid -lithium -metoprolol -NSAIDS, medicines for pain and inflammation, like ibuprofen or naproxen -other medicines that prolong the  QT interval (cause an abnormal heart rhythm) -procarbazine -rasagiline -supplements like St. John's wort, kava kava, valerian -tramadol -tryptophan This list may not describe all possible interactions. Give your health care provider a list of all the medicines, herbs, non-prescription drugs,  or dietary supplements you use. Also tell them if you smoke, drink alcohol, or use illegal drugs. Some items may interact with your medicine. What should I watch for while using this medicine? Tell your doctor if your symptoms do not get better or if they get worse. Visit your doctor or health care professional for regular checks on your progress. Because it may take several weeks to see the full effects of this medicine, it is important to continue your treatment as prescribed by your doctor. Patients and their families should watch out for new or worsening thoughts of suicide or depression. Also watch out for sudden changes in feelings such as feeling anxious, agitated, panicky, irritable, hostile, aggressive, impulsive, severely restless, overly excited and hyperactive, or not being able to sleep. If this happens, especially at the beginning of treatment or after a change in dose, call your health care professional. This medicine can cause an increase in blood pressure. Check with your doctor for instructions on monitoring your blood pressure while taking this medicine. You may get drowsy or dizzy. Do not drive, use machinery, or do anything that needs mental alertness until you know how this medicine affects you. Do not stand or sit up quickly, especially if you are an older patient. This reduces the risk of dizzy or fainting spells. Alcohol may interfere with the effect of this medicine. Avoid alcoholic drinks. Your mouth may get dry. Chewing sugarless gum, sucking hard candy and drinking plenty of water will help. Contact your doctor if the problem does not go away or is severe. What side effects may I notice from receiving this medicine? Side effects that you should report to your doctor or health care professional as soon as possible: -allergic reactions like skin rash, itching or hives, swelling of the face, lips, or tongue -anxious -breathing problems -confusion -changes in vision -chest  pain -confusion -elevated mood, decreased need for sleep, racing thoughts, impulsive behavior -eye pain -fast, irregular heartbeat -feeling faint or lightheaded, falls -feeling agitated, angry, or irritable -hallucination, loss of contact with reality -high blood pressure -loss of balance or coordination -palpitations -redness, blistering, peeling or loosening of the skin, including inside the mouth -restlessness, pacing, inability to keep still -seizures -stiff muscles -suicidal thoughts or other mood changes -trouble passing urine or change in the amount of urine -trouble sleeping -unusual bleeding or bruising -unusually weak or tired -vomiting Side effects that usually do not require medical attention (report to your doctor or health care professional if they continue or are bothersome): -change in sex drive or performance -change in appetite or weight -constipation -dizziness -dry mouth -headache -increased sweating -nausea -tired This list may not describe all possible side effects. Call your doctor for medical advice about side effects. You may report side effects to FDA at 1-800-FDA-1088. Where should I keep my medicine? Keep out of the reach of children. Store at a controlled temperature between 20 and 25 degrees C (68 degrees and 77 degrees F), in a dry place. Throw away any unused medicine after the expiration date. NOTE: This sheet is a summary. It may not cover all possible information. If you have questions about this medicine, talk to your doctor, pharmacist, or health care provider.  2018 Elsevier/Gold Standard (2016-02-15 18:38:02)  Eszopiclone tablets What is this medicine? ESZOPICLONE (es ZOE pi clone) is used to treat insomnia. This medicine helps you to fall asleep and sleep through the night. This medicine may be used for other purposes; ask your health care provider or pharmacist if you have questions. COMMON BRAND NAME(S): Lunesta What should I tell my  health care provider before I take this medicine? They need to know if you have any of these conditions: -depression -history of a drug or alcohol abuse problem -liver disease -lung or breathing disease -suicidal thoughts -an unusual or allergic reaction to eszopiclone, other medicines, foods, dyes, or preservatives -pregnant or trying to get pregnant -breast-feeding How should I use this medicine? Take this medicine by mouth with a glass of water. Follow the directions on the prescription label. It is better to take this medicine on an empty stomach and only when you are ready for bed. Do not take your medicine more often than directed. If you have been taking this medicine for several weeks and suddenly stop taking it, you may get unpleasant withdrawal symptoms. Your doctor or health care professional may want to gradually reduce the dose. Do not stop taking this medicine on your own. Always follow your doctor or health care professional's advice. Talk to your pediatrician regarding the use of this medicine in children. Special care may be needed. Overdosage: If you think you have taken too much of this medicine contact a poison control center or emergency room at once. NOTE: This medicine is only for you. Do not share this medicine with others. What if I miss a dose? This does not apply. This medicine should only be taken immediately before going to sleep. Do not take double or extra doses. What may interact with this medicine? -herbal medicines like kava kava, melatonin, St. John's wort and valerian -lorazepam -medicines for fungal infections like ketoconazole, fluconazole, or itraconazole -olanzapine This list may not describe all possible interactions. Give your health care provider a list of all the medicines, herbs, non-prescription drugs, or dietary supplements you use. Also tell them if you smoke, drink alcohol, or use illegal drugs. Some items may interact with your medicine. What  should I watch for while using this medicine? Visit your doctor or health care professional for regular checks on your progress. Keep a regular sleep schedule by going to bed at about the same time nightly. Avoid caffeine-containing drinks in the evening hours, as caffeine can cause trouble with falling asleep. Talk to your doctor if you still have trouble sleeping. After taking this medicine for sleep, you may get up out of bed while not being fully awake and do an activity that you do not know you are doing. The next morning, you may have no memory of the event. Activities such as driving a car ("sleep-driving"), making and eating food, talking on the phone, sexual activity, and sleep-walking have been reported. Call your doctor right away if you find out you have done any of these activities. Do not take this medicine if you have used alcohol that evening or before bed or taken another medicine for sleep, since your risk of doing these sleep-related activities will be increased. Do not take this medicine unless you are able to stay in bed for a full night (7 to 8 hours) before you must be active again. You may have a decrease in mental alertness the day after use, even if you feel that you are fully awake. Tell your doctor if you will need  to perform activities requiring full alertness, such as driving, the next day. Do not stand or sit up quickly after taking this medicine, especially if you are an older patient. This reduces the risk of dizzy or fainting spells. If you or your family notice any changes in your behavior, such as new or worsening depression, thoughts of harming yourself, anxiety, other unusual or disturbing thoughts, or memory loss, call your doctor right away. After you stop taking this medicine, you may have trouble falling asleep. This is called rebound insomnia. This problem usually goes away on its own after 1 or 2 nights. What side effects may I notice from receiving this  medicine? Side effects that you should report to your doctor or health care professional as soon as possible: -allergic reactions like skin rash, itching or hives, swelling of the face, lips, or tongue -changes in vision -confusion -depressed mood -feeling faint or lightheaded, falls -hallucinations -problems with balance, speaking, walking -restlessness, excitability, or feelings of agitation -unusual activities while asleep like driving, eating, making phone calls Side effects that usually do not require medical attention (report to your doctor or health care professional if they continue or are bothersome): -dizziness, or daytime drowsiness, sometimes called a hangover effect -headache This list may not describe all possible side effects. Call your doctor for medical advice about side effects. You may report side effects to FDA at 1-800-FDA-1088. Where should I keep my medicine? Keep out of the reach of children. This medicine can be abused. Keep your medicine in a safe place to protect it from theft. Do not share this medicine with anyone. Selling or giving away this medicine is dangerous and against the law. This medicine may cause accidental overdose and death if taken by other adults, children, or pets. Mix any unused medicine with a substance like cat litter or coffee grounds. Then throw the medicine away in a sealed container like a sealed bag or a coffee can with a lid. Do not use the medicine after the expiration date. Store at room temperature between 15 and 30 degrees C (59 and 86 degrees F). NOTE: This sheet is a summary. It may not cover all possible information. If you have questions about this medicine, talk to your doctor, pharmacist, or health care provider.  2018 [employer] Standard (2014-06-07 15:22:01)

## 2017-09-08 ENCOUNTER — Ambulatory Visit: Payer: Self-pay | Admitting: Pain Medicine

## 2017-09-09 ENCOUNTER — Ambulatory Visit: Payer: Self-pay | Admitting: Pain Medicine

## 2017-09-19 ENCOUNTER — Telehealth: Payer: Self-pay

## 2017-09-19 NOTE — Telephone Encounter (Signed)
faxed and confirmed notice that pa was approved until 03-02-18 for medication lunesta

## 2017-09-19 NOTE — Telephone Encounter (Signed)
pr was approved on until  03-02-18

## 2017-09-22 ENCOUNTER — Telehealth: Payer: Self-pay

## 2017-09-22 ENCOUNTER — Other Ambulatory Visit: Payer: Self-pay | Admitting: Psychiatry

## 2017-09-22 NOTE — Telephone Encounter (Signed)
received a request for additional refill on the medication eszopiclone 2mg     eszopiclone (LUNESTA) 2 MG TABS tablet 30 tablet 0 08/29/2017    Sig - Route: Take 1 tablet (2 mg total) by mouth at bedtime as needed for sleep. Take immediately before bedtime - Oral   Class: Print    Pt was last seen on  08-29-17 next appt 10-02-17

## 2017-09-22 NOTE — Telephone Encounter (Signed)
Janett Billow CMA verified that pharmacy has a script waiting for her for her lunesta, will not refill today.

## 2017-10-02 ENCOUNTER — Ambulatory Visit: Payer: Medicaid Other | Admitting: Psychiatry

## 2017-10-07 ENCOUNTER — Other Ambulatory Visit: Payer: Self-pay | Admitting: Pain Medicine

## 2017-10-07 DIAGNOSIS — M797 Fibromyalgia: Secondary | ICD-10-CM

## 2017-10-14 ENCOUNTER — Other Ambulatory Visit: Payer: Self-pay

## 2017-10-14 ENCOUNTER — Encounter: Payer: Self-pay | Admitting: Nurse Practitioner

## 2017-10-14 ENCOUNTER — Ambulatory Visit: Payer: Medicaid Other | Attending: Nurse Practitioner | Admitting: Nurse Practitioner

## 2017-10-14 VITALS — BP 126/73 | HR 88 | Temp 98.4°F | Resp 16 | Ht 63.0 in | Wt 215.0 lb

## 2017-10-14 DIAGNOSIS — G4733 Obstructive sleep apnea (adult) (pediatric): Secondary | ICD-10-CM | POA: Diagnosis not present

## 2017-10-14 DIAGNOSIS — M797 Fibromyalgia: Secondary | ICD-10-CM | POA: Diagnosis not present

## 2017-10-14 DIAGNOSIS — G4700 Insomnia, unspecified: Secondary | ICD-10-CM | POA: Diagnosis not present

## 2017-10-14 DIAGNOSIS — I10 Essential (primary) hypertension: Secondary | ICD-10-CM | POA: Insufficient documentation

## 2017-10-14 DIAGNOSIS — Z880 Allergy status to penicillin: Secondary | ICD-10-CM | POA: Diagnosis not present

## 2017-10-14 DIAGNOSIS — M47816 Spondylosis without myelopathy or radiculopathy, lumbar region: Secondary | ICD-10-CM | POA: Diagnosis not present

## 2017-10-14 DIAGNOSIS — M4726 Other spondylosis with radiculopathy, lumbar region: Secondary | ICD-10-CM | POA: Insufficient documentation

## 2017-10-14 DIAGNOSIS — M19012 Primary osteoarthritis, left shoulder: Secondary | ICD-10-CM

## 2017-10-14 DIAGNOSIS — E781 Pure hyperglyceridemia: Secondary | ICD-10-CM | POA: Diagnosis not present

## 2017-10-14 DIAGNOSIS — G894 Chronic pain syndrome: Secondary | ICD-10-CM

## 2017-10-14 DIAGNOSIS — G43909 Migraine, unspecified, not intractable, without status migrainosus: Secondary | ICD-10-CM | POA: Insufficient documentation

## 2017-10-14 DIAGNOSIS — Z79899 Other long term (current) drug therapy: Secondary | ICD-10-CM | POA: Insufficient documentation

## 2017-10-14 DIAGNOSIS — K219 Gastro-esophageal reflux disease without esophagitis: Secondary | ICD-10-CM | POA: Insufficient documentation

## 2017-10-14 DIAGNOSIS — M19011 Primary osteoarthritis, right shoulder: Secondary | ICD-10-CM | POA: Diagnosis not present

## 2017-10-14 DIAGNOSIS — F411 Generalized anxiety disorder: Secondary | ICD-10-CM | POA: Insufficient documentation

## 2017-10-14 DIAGNOSIS — F329 Major depressive disorder, single episode, unspecified: Secondary | ICD-10-CM | POA: Insufficient documentation

## 2017-10-14 DIAGNOSIS — Z5181 Encounter for therapeutic drug level monitoring: Secondary | ICD-10-CM | POA: Insufficient documentation

## 2017-10-14 DIAGNOSIS — M5441 Lumbago with sciatica, right side: Secondary | ICD-10-CM

## 2017-10-14 DIAGNOSIS — G8929 Other chronic pain: Secondary | ICD-10-CM | POA: Diagnosis not present

## 2017-10-14 DIAGNOSIS — M5442 Lumbago with sciatica, left side: Secondary | ICD-10-CM

## 2017-10-14 DIAGNOSIS — Z79891 Long term (current) use of opiate analgesic: Secondary | ICD-10-CM | POA: Diagnosis not present

## 2017-10-14 DIAGNOSIS — Z87891 Personal history of nicotine dependence: Secondary | ICD-10-CM | POA: Insufficient documentation

## 2017-10-14 DIAGNOSIS — Z886 Allergy status to analgesic agent status: Secondary | ICD-10-CM | POA: Insufficient documentation

## 2017-10-14 DIAGNOSIS — M5116 Intervertebral disc disorders with radiculopathy, lumbar region: Secondary | ICD-10-CM | POA: Diagnosis not present

## 2017-10-14 DIAGNOSIS — M62838 Other muscle spasm: Secondary | ICD-10-CM

## 2017-10-14 DIAGNOSIS — E669 Obesity, unspecified: Secondary | ICD-10-CM | POA: Insufficient documentation

## 2017-10-14 MED ORDER — METHYLPREDNISOLONE 4 MG PO TBPK: ORAL_TABLET | ORAL | 0 refills | Status: AC

## 2017-10-14 MED ORDER — PREGABALIN 150 MG PO CAPS
150.0000 mg | ORAL_CAPSULE | Freq: Three times a day (TID) | ORAL | 2 refills | Status: DC
Start: 2017-10-14 — End: 2018-01-05

## 2017-10-14 MED ORDER — DICLOFENAC SODIUM 1 % TD GEL: 2.0000 g | Freq: Four times a day (QID) | TRANSDERMAL | 2 refills | Status: DC

## 2017-10-14 NOTE — Progress Notes (Signed)
Nursing Pain Medication Assessment:  Safety precautions to be maintained throughout the outpatient stay will include: orient to surroundings, keep bed in low position, maintain call bell within reach at all times, provide assistance with transfer out of bed and ambulation.  Medication Inspection Compliance: Pill count conducted under aseptic conditions, in front of the patient. Neither the pills nor the bottle was removed from the patient's sight at any time. Once count was completed pills were immediately returned to the patient in their original bottle.  Medication: Hydrocodone/APAP Pill/Patch Count: 9 of 30 pills remain Pill/Patch Appearance: Markings consistent with prescribed medication Bottle Appearance: Standard pharmacy container. Clearly labeled. Filled Date: 41 / 12 / 2018 Last Medication intake:  Yesterday

## 2017-10-14 NOTE — Progress Notes (Addendum)
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 [facility] Note by: Vevelyn Francois NP Date: 10/14/2017;  Time: 10:43 AM  Pain Score Disclaimer: We use the NRS-11 scale. This is a self-reported, subjective measurement of pain severity with only modest accuracy. It is used primarily to identify changes within a particular patient. It must be understood that outpatient pain scales are significantly less accurate that those used for research, where they can be applied under ideal controlled circumstances with minimal exposure to variables. In reality, the score is likely to be a combination of pain intensity and pain affect, where pain affect describes the degree of emotional arousal or changes in action readiness caused by the sensory experience of pain. Factors such as social and work situation, setting, emotional state, anxiety levels, expectation, and prior pain experience may influence pain perception and show large inter-individual  differences that may also be affected by time variables.  Patient instructions provided during this appointment: Patient Instructions    ____________________________________________________________________________________________  Medication Rules  Applies to: All patients receiving prescriptions (written or electronic).  Pharmacy of record: Pharmacy where electronic prescriptions will be sent. If written prescriptions are taken to a different pharmacy, please inform the nursing staff. The pharmacy listed in the electronic medical record should be the one where you would like electronic prescriptions to be sent.  Prescription refills: Only during scheduled appointments. Applies to both, written and electronic prescriptions.  NOTE: The following applies primarily to controlled substances (Opioid* Pain Medications).   Patient's responsibilities: 1. Pain Pills: Bring all pain pills to every appointment (except for procedure appointments). 2. Pill Bottles: Bring pills in original pharmacy bottle. Always bring newest bottle. Bring bottle, even if empty. 3. Medication refills: You are responsible for knowing and keeping track of what medications you need refilled. The day before your appointment, write a list of all prescriptions that need to be refilled. Bring that list to your appointment and give it to the admitting nurse. Prescriptions will be written only during appointments. If you forget a medication, it will not be "Called in", "Faxed", or "electronically sent". You will need to get another appointment to get these prescribed. 4. Prescription Accuracy: You are responsible for carefully inspecting your prescriptions before leaving our office. Have the discharge nurse carefully go over each prescription with you, before taking them home. Make sure that your name is accurately spelled, that your address is correct. Check the name and dose of your medication to make sure it is accurate. Check the  number of pills, and the written instructions to make sure they are clear and accurate. Make sure that you are given enough medication to last until your next medication refill appointment. 5. Taking Medication: Take medication as prescribed. Never take more pills than instructed. Never take medication more frequently than prescribed. Taking less pills or less frequently is permitted and encouraged, when it comes to controlled substances (written prescriptions).  6. Inform other Doctors: Always inform, all of your healthcare providers, of all the medications you take. 7. Pain Medication from other Providers: You are not allowed to accept any additional pain medication from any other Doctor or Healthcare provider. There are two exceptions to this rule. (see below) In the event that you require additional pain medication, you are responsible for notifying us, as stated below. 8. Medication Agreement: You are responsible for carefully reading and following our Medication Agreement. This must be signed before receiving any prescriptions from our practice. Safely store a copy of your signed Agreement. Violations to the Agreement will result in no further prescriptions. (Additional copies of our Medication Agreement are available  upon request.) 9. Laws, Rules, & Regulations: All patients are expected to follow all Federal and [employer], [employer], Rules, [employer]. Ignorance of the Laws does not constitute a valid excuse. The use of any illegal substances is prohibited. 10. Adopted CDC guidelines & recommendations: Target dosing levels will be at or below 60 MME/day. Use of benzodiazepines** is not recommended.  Exceptions: There are only two exceptions to the rule of not receiving pain medications from other Healthcare Providers. 1. Exception #1 (Emergencies): In the event of an emergency (i.e.: accident requiring emergency care), you are allowed to receive additional pain medication. However, you are  responsible for: As soon as you are able, call our office [phone], at any time of the day or night, and leave a message stating your name, the date and nature of the emergency, and the name and dose of the medication prescribed. In the event that your call is answered by a member of our staff, make sure to document and save the date, time, and the name of the person that took your information.  2. Exception #2 (Planned Surgery): In the event that you are scheduled by another doctor or dentist to have any type of surgery or procedure, you are allowed (for a period no longer than 30 days), to receive additional pain medication, for the acute post-op pain. However, in this case, you are responsible for picking up a copy of our "Post-op Pain Management for Surgeons" handout, and giving it to your surgeon or dentist. This document is available at our office, and does not require an appointment to obtain it. Simply go to our office during business hours (Monday-Thursday from 8:00 AM to 4:00 PM) (Friday 8:00 AM to 12:00 Noon) or if you have a scheduled appointment with [place], prior to your surgery, and ask for it by name. In addition, you will need to provide [place] with your name, name of your surgeon, type of surgery, and date of procedure or surgery.  *Opioid medications include: morphine, codeine, oxycodone, oxymorphone, hydrocodone, hydromorphone, meperidine, tramadol, tapentadol, buprenorphine, fentanyl, methadone. **Benzodiazepine medications include: diazepam (Valium), alprazolam (Xanax), clonazepam (Klonopine), lorazepam (Ativan), clorazepate (Tranxene), chlordiazepoxide (Librium), estazolam (Prosom), oxazepam (Serax), temazepam (Restoril), triazolam (Halcion)  ____________________________________________________________________________________________   BMI interpretation table: BMI level Category Range association with higher incidence of chronic pain  <18 kg/m2 Underweight   18.5-24.9 kg/m2 Ideal  body weight   25-29.9 kg/m2 Overweight Increased incidence by 20%  30-34.9 kg/m2 Obese (Class I) Increased incidence by 68%  35-39.9 kg/m2 Severe obesity (Class II) Increased incidence by 136%  >40 kg/m2 Extreme obesity (Class III) Increased incidence by 254%   BMI Readings from Last 4 Encounters:  10/14/17 38.09 kg/m  08/12/17 39.56 kg/m  08/11/17 38.97 kg/m  07/22/17 38.62 kg/m   Wt Readings from Last 4 Encounters:  10/14/17 215 lb (97.5 kg)  08/12/17 223 lb 4.8 oz (101.3 kg)  08/11/17 220 lb (99.8 kg)  07/22/17 218 lb (98.9 kg)  GENERAL RISKS AND COMPLICATIONS  What are the risk, side effects and possible complications? Generally speaking, most procedures are safe.  However, with any procedure there are risks, side effects, and the possibility of complications.  The risks and complications are dependent upon the sites that are lesioned, or the type of nerve block to be performed.  The closer the procedure is to the spine, the more serious the risks are.  Great care is taken when placing the radio frequency needles, block needles or lesioning probes, but sometimes complications  can occur. 1. Infection: Any time there is an injection through the skin, there is a risk of infection.  This is why sterile conditions are used for these blocks.  There are four possible types of infection. 1. Localized skin infection. 2. Central Nervous System Infection-This can be in the form of Meningitis, which can be deadly. 3. Epidural Infections-This can be in the form of an epidural abscess, which can cause pressure inside of the spine, causing compression of the spinal cord with subsequent paralysis. This would require an emergency surgery to decompress, and there are no guarantees that the patient would recover from the paralysis. 4. Discitis-This is an infection of the intervertebral discs.  It occurs in about 1% of discography procedures.  It is difficult to treat and it may lead to surgery.         2. Pain: the needles have to go through skin and soft tissues, will cause soreness.       3. Damage to internal structures:  The nerves to be lesioned may be near blood vessels or    other nerves which can be potentially damaged.       4. Bleeding: Bleeding is more common if the patient is taking blood thinners such as  aspirin, Coumadin, Ticiid, Plavix, etc., or if he/she have some genetic predisposition  such as hemophilia. Bleeding into the spinal canal can cause compression of the spinal  cord with subsequent paralysis.  This would require an emergency surgery to  decompress and there are no guarantees that the patient would recover from the  paralysis.       5. Pneumothorax:  Puncturing of a lung is a possibility, every time a needle is introduced in  the area of the chest or upper back.  Pneumothorax refers to free air around the  collapsed lung(s), inside of the thoracic cavity (chest cavity).  Another two possible  complications related to a similar event would include: Hemothorax and Chylothorax.   These are variations of the Pneumothorax, where instead of air around the collapsed  lung(s), you may have blood or chyle, respectively.       6. Spinal headaches: They may occur with any procedures in the area of the spine.       7. Persistent CSF (Cerebro-Spinal Fluid) leakage: This is a rare problem, but may occur  with prolonged intrathecal or epidural catheters either due to the formation of a fistulous  track or a dural tear.       8. Nerve damage: By working so close to the spinal cord, there is always a possibility of  nerve damage, which could be as serious as a permanent spinal cord injury with  paralysis.       9. Death:  Although rare, severe deadly allergic reactions known as "Anaphylactic  reaction" can occur to any of the medications used.      10. Worsening of the symptoms:  We can always make thing worse.  What are the chances of something like this happening? Chances of any of this  occuring are extremely low.  By statistics, you have more of a chance of getting killed in a motor vehicle accident: while driving to the hospital than any of the above occurring .  Nevertheless, you should be aware that they are possibilities.  In general, it is similar to taking a shower.  Everybody knows that you can slip, hit your head and get killed.  Does that mean that you should not shower  again?  Nevertheless always keep in mind that statistics do not mean anything if you happen to be on the wrong side of them.  Even if a procedure has a 1 (one) in a 1,000,000 (million) chance of going wrong, it you happen to be that one..Also, keep in mind that by statistics, you have more of a chance of having something go wrong when taking medications.  Who should not have this procedure? If you are on a blood thinning medication (e.g. Coumadin, Plavix, see list of "Blood Thinners"), or if you have an active infection going on, you should not have the procedure.  If you are taking any blood thinners, please inform your physician.  How should I prepare for this procedure?  Do not eat or drink anything at least six hours prior to the procedure.  Bring a driver with you .  It cannot be a taxi.  Come accompanied by an adult that can drive you back, and that is strong enough to help you if your legs get weak or numb from the local anesthetic.  Take all of your medicines the morning of the procedure with just enough water to swallow them.  If you have diabetes, make sure that you are scheduled to have your procedure done first thing in the morning, whenever possible.  If you have diabetes, take only half of your insulin dose and notify our nurse that you have done so as soon as you arrive at the clinic.  If you are diabetic, but only take blood sugar pills (oral hypoglycemic), then do not take them on the morning of your procedure.  You may take them after you have had the procedure.  Do not take aspirin  or any aspirin-containing medications, at least eleven (11) days prior to the procedure.  They may prolong bleeding.  Wear loose fitting clothing that may be easy to take off and that you would not mind if it got stained with Betadine or blood.  Do not wear any jewelry or perfume  Remove any nail coloring.  It will interfere with some of our monitoring equipment.  NOTE: Remember that this is not meant to be interpreted as a complete list of all possible complications.  Unforeseen problems may occur.  BLOOD THINNERS The following drugs contain aspirin or other products, which can cause increased bleeding during surgery and should not be taken for 2 weeks prior to and 1 week after surgery.  If you should need take something for relief of minor pain, you may take acetaminophen which is found in Tylenol,m Datril, Anacin-3 and Panadol. It is not blood thinner. The products listed below are.  Do not take any of the products listed below in addition to any listed on your instruction sheet.  A.P.C or A.P.C with Codeine Codeine Phosphate Capsules #3 Ibuprofen Ridaura  ABC compound Congesprin Imuran rimadil  Advil Cope Indocin Robaxisal  Alka-Seltzer Effervescent Pain Reliever and Antacid Coricidin or Coricidin-D  Indomethacin Rufen  Alka-Seltzer plus Cold Medicine Cosprin Ketoprofen S-A-C Tablets  Anacin Analgesic Tablets or Capsules Coumadin Korlgesic Salflex  Anacin Extra Strength Analgesic tablets or capsules CP-2 Tablets Lanoril Salicylate  Anaprox Cuprimine Capsules Levenox Salocol  Anexsia-D Dalteparin Magan Salsalate  Anodynos Darvon compound Magnesium Salicylate Sine-off  Ansaid Dasin Capsules Magsal Sodium Salicylate  Anturane Depen Capsules Marnal Soma  APF Arthritis pain formula Dewitt's Pills Measurin Stanback  Argesic Dia-Gesic Meclofenamic Sulfinpyrazone  Arthritis Bayer Timed Release Aspirin Diclofenac Meclomen Sulindac  Arthritis pain formula Anacin Dicumarol Medipren  Supac   Analgesic (Safety coated) Arthralgen Diffunasal Mefanamic Suprofen  Arthritis Strength Bufferin Dihydrocodeine Mepro Compound Suprol  Arthropan liquid Dopirydamole Methcarbomol with Aspirin Synalgos  ASA tablets/Enseals Disalcid Micrainin Tagament  Ascriptin Doan's Midol Talwin  Ascriptin A/D Dolene Mobidin Tanderil  Ascriptin Extra Strength Dolobid Moblgesic Ticlid  Ascriptin with Codeine Doloprin or Doloprin with Codeine Momentum Tolectin  Asperbuf Duoprin Mono-gesic Trendar  Aspergum Duradyne Motrin or Motrin IB Triminicin  Aspirin plain, buffered or enteric coated Durasal Myochrisine Trigesic  Aspirin Suppositories Easprin Nalfon Trillsate  Aspirin with Codeine Ecotrin Regular or Extra Strength Naprosyn Uracel  Atromid-S Efficin Naproxen Ursinus  Auranofin Capsules Elmiron Neocylate Vanquish  Axotal Emagrin Norgesic Verin  Azathioprine Empirin or Empirin with Codeine Normiflo Vitamin E  Azolid Emprazil Nuprin Voltaren  Bayer Aspirin plain, buffered or children's or timed BC Tablets or powders Encaprin Orgaran Warfarin Sodium  Buff-a-Comp Enoxaparin Orudis Zorpin  Buff-a-Comp with Codeine Equegesic Os-Cal-Gesic   Buffaprin Excedrin plain, buffered or Extra Strength Oxalid   Bufferin Arthritis Strength Feldene Oxphenbutazone   Bufferin plain or Extra Strength Feldene Capsules Oxycodone with Aspirin   Bufferin with Codeine Fenoprofen Fenoprofen Pabalate or Pabalate-SF   Buffets II Flogesic Panagesic   Buffinol plain or Extra Strength Florinal or Florinal with Codeine Panwarfarin   Buf-Tabs Flurbiprofen Penicillamine   Butalbital Compound Four-way cold tablets Penicillin   Butazolidin Fragmin Pepto-Bismol   Carbenicillin Geminisyn Percodan   Carna Arthritis Reliever Geopen Persantine   Carprofen Gold's salt Persistin   Chloramphenicol Goody's Phenylbutazone   Chloromycetin Haltrain Piroxlcam   Clmetidine heparin Plaquenil   Cllnoril Hyco-pap Ponstel   Clofibrate Hydroxy  chloroquine Propoxyphen         Before stopping any of these medications, be sure to consult the physician who ordered them.  Some, such as Coumadin (Warfarin) are ordered to prevent or treat serious conditions such as "deep thrombosis", "pumonary embolisms", and other heart problems.  The amount of time that you may need off of the medication may also vary with the medication and the reason for which you were taking it.  If you are taking any of these medications, please make sure you notify your pain physician before you undergo any procedures.         Sacroiliac (SI) Joint Injection Patient Information  Description: The sacroiliac joint connects the scrum (very low back and tailbone) to the ilium (a pelvic bone which also forms half of the hip joint).  Normally this joint experiences very little motion.  When this joint becomes inflamed or unstable low back and or hip and pelvis pain may result.  Injection of this joint with local anesthetics (numbing medicines) and steroids can provide diagnostic information and reduce pain.  This injection is performed with the aid of x-ray guidance into the tailbone area while you are lying on your stomach.   You may experience an electrical sensation down the leg while this is being done.  You may also experience numbness.  We also may ask if we are reproducing your normal pain during the injection.  Conditions which may be treated SI injection:   Low back, buttock, hip or leg pain  Preparation for the Injection:  3. Do not eat any solid food or dairy products within 8 hours of your appointment.  4. You may drink clear liquids up to 3 hours before appointment.  Clear liquids include water, black coffee, juice or soda.  No milk or cream please. 5. You may take your regular medications, including pain  medications with a sip of water before your appointment.  Diabetics should hold regular insulin (if take separately) and take 1/2 normal NPH dose the  morning of the procedure.  Carry some sugar containing items with you to your appointment. 6. A driver must accompany you and be prepared to drive you home after your procedure. 7. Bring all of your current medications with you. 8. An IV may be inserted and sedation may be given at the discretion of the physician. 9. A blood pressure cuff, EKG and other monitors will often be applied during the procedure.  Some patients may need to have extra oxygen administered for a short period.  10. You will be asked to provide medical information, including your allergies, prior to the procedure.  We must know immediately if you are taking blood thinners (like Coumadin/Warfarin) or if you are allergic to IV iodine contrast (dye).  We must know if you could possible be pregnant.  Possible side effects:   Bleeding from needle site  Infection (rare, may require surgery)  Nerve injury (rare)  Numbness & tingling (temporary)  A brief convulsion or seizure  Light-headedness (temporary)  Pain at injection site (several days)  Decreased blood pressure (temporary)  Weakness in the leg (temporary)   Call if you experience:   New onset weakness or numbness of an extremity below the injection site that last more than 8 hours.  Hives or difficulty breathing ( go to the emergency room)  Inflammation or drainage at the injection site  Any new symptoms which are concerning to you  Please note:  Although the local anesthetic injected can often make your back/ hip/ buttock/ leg feel good for several hours after the injections, the pain will likely return.  It takes 3-7 days for steroids to work in the sacroiliac area.  You may not notice any pain relief for at least that one week.  If effective, we will often do a series of three injections spaced 3-6 weeks apart to maximally decrease your pain.  After the initial series, we generally will wait some months before a repeat injection of the same  type.  If you have any questions, please call [phone] [facility]   Facet Joint Block The facet joints connect the bones of the spine (vertebrae). They make it possible for you to bend, twist, and make other movements with your spine. They also keep you from bending too far, twisting too far, and making other excessive movements. A facet joint block is a procedure where a numbing medicine (anesthetic) is injected into a facet joint. Often, a type of anti-inflammatory medicine called a steroid is also injected. A facet joint block may be done to diagnose neck or back pain. If the pain gets better after a facet joint block, it means the pain is probably coming from the facet joint. If the pain does not get better, it means the pain is probably not coming from the facet joint. A facet joint block may also be done to relieve neck or back pain caused by an inflamed facet joint. A facet joint block is only done to relieve pain if the pain does not improve with other methods, such as medicine, exercise programs, and physical therapy. Tell a health care provider about:  Any allergies you have.  All medicines you are taking, including vitamins, herbs, eye drops, creams, and over-the-counter medicines.  Any problems you or family members have had with anesthetic medicines.  Any  blood disorders you have.  Any surgeries you have had.  Any medical conditions you have.  Whether you are pregnant or may be pregnant. What are the risks? Generally, this is a safe procedure. However, problems may occur, including:  Bleeding.  Injury to a nerve near the injection site.  Pain at the injection site.  Weakness or numbness in areas controlled by nerves near the injection site.  Infection.  Temporary fluid retention.  Allergic reactions to medicines or dyes.  Injury to other structures or organs near the injection site.  What happens before the  procedure?  Follow instructions from your health care provider about eating or drinking restrictions.  Ask your health care provider about: ? Changing or stopping your regular medicines. This is especially important if you are taking diabetes medicines or blood thinners. ? Taking medicines such as aspirin and ibuprofen. These medicines can thin your blood. Do not take these medicines before your procedure if your health care provider instructs you not to.  Do not take any new dietary supplements or medicines without asking your health care provider first.  Plan to have someone take you home after the procedure. What happens during the procedure?  You may need to remove your clothing and dress in an open-back gown.  The procedure will be done while you are lying on an X-ray table. You will most likely be asked to lie on your stomach, but you may be asked to lie in a different position if an injection will be made in your neck.  Machines will be used to monitor your oxygen levels, heart rate, and blood pressure.  If an injection will be made in your neck, an IV tube will be inserted into one of your veins. Fluids and medicine will flow directly into your body through the IV tube.  The area over the facet joint where the injection will be made will be cleaned with soap. The surrounding skin will be covered with clean drapes.  A numbing medicine (local anesthetic) will be applied to your skin. Your skin may sting or burn for a moment.  A video X-ray machine (fluoroscopy) will be used to locate the joint. In some cases, a CT scan may be used.  A contrast dye may be injected into the facet joint area to help locate the joint.  When the joint is located, an anesthetic will be injected into the joint through the needle.  Your health care provider will ask you whether you feel pain relief. If you do feel relief, a steroid may be injected to provide pain relief for a longer period of time. If  you do not feel relief or feel only partial relief, additional injections of an anesthetic may be made in other facet joints.  The needle will be removed.  Your skin will be cleaned.  A bandage (dressing) will be applied over each injection site. The procedure may vary among health care providers and hospitals. What happens after the procedure?  You will be observed for 15-30 minutes before being allowed to go home. This information is not intended to replace advice given to you by your health care provider. Make sure you discuss any questions you have with your health care provider. Document Released: 02/05/2007 Document Revised: 10/18/2015 Document Reviewed: 06/12/2015 Elsevier Interactive Patient Education  2018 [employer].  Epidural Steroid Injection An epidural steroid injection is a shot of steroid medicine and numbing medicine that is given into the space between the spinal cord and  the bones in your back (epidural space). The shot helps relieve pain caused by an irritated or swollen nerve root. The amount of pain relief you get from the injection depends on what is causing the nerve to be swollen and irritated, and how long your pain lasts. You are more likely to benefit from this injection if your pain is strong and comes on suddenly rather than if you have had pain for a long time. Tell a health care provider about:  Any allergies you have.  All medicines you are taking, including vitamins, herbs, eye drops, creams, and over-the-counter medicines.  Any problems you or family members have had with anesthetic medicines.  Any blood disorders you have.  Any surgeries you have had.  Any medical conditions you have.  Whether you are pregnant or may be pregnant. What are the risks? Generally, this is a safe procedure. However, problems may occur, including:  Headache.  Bleeding.  Infection.  Allergic reaction to medicines.  Damage to your nerves.  What happens  before the procedure? Staying hydrated Follow instructions from your health care provider about hydration, which may include:  Up to 2 hours before the procedure - you may continue to drink clear liquids, such as water, clear fruit juice, black coffee, and plain tea.  Eating and drinking restrictions Follow instructions from your health care provider about eating and drinking, which may include:  8 hours before the procedure - stop eating heavy meals or foods such as meat, fried foods, or fatty foods.  6 hours before the procedure - stop eating light meals or foods, such as toast or cereal.  6 hours before the procedure - stop drinking milk or drinks that contain milk.  2 hours before the procedure - stop drinking clear liquids.  Medicine  You may be given medicines to lower anxiety.  Ask your health care provider about: ? Changing or stopping your regular medicines. This is especially important if you are taking diabetes medicines or blood thinners. ? Taking medicines such as aspirin and ibuprofen. These medicines can thin your blood. Do not take these medicines before your procedure if your health care provider instructs you not to. General instructions  Plan to have someone take you home from the hospital or clinic. What happens during the procedure?  You may receive a medicine to help you relax (sedative).  You will be asked to lie on your abdomen.  The injection site will be cleaned.  A numbing medicine (local anesthetic) will be used to numb the injection site.  A needle will be inserted through your skin into the epidural space. You may feel some discomfort when this happens. An X-ray machine will be used to make sure the needle is put as close as possible to the affected nerve.  A steroid medicine and a local anesthetic will be injected into the epidural space.  The needle will be removed.  A bandage (dressing) will be put over the injection site. What happens  after the procedure?  Your blood pressure, heart rate, breathing rate, and blood oxygen level will be monitored until the medicines you were given have worn off.  Your arm or leg may feel weak or numb for a few hours.  The injection site may feel sore.  Do not drive for 24 hours if you received a sedative. This information is not intended to replace advice given to you by your health care provider. Make sure you discuss any questions you have with your health  care provider. Document Released: 12/24/2007 Document Revised: 02/28/2016 Document Reviewed: 01/02/2016 Elsevier Interactive Patient Education  [employer].

## 2017-10-14 NOTE — Patient Instructions (Addendum)
____________________________________________________________________________________________  Medication Rules  Applies to: All patients receiving prescriptions (written or electronic).  Pharmacy of record: Pharmacy where electronic prescriptions will be sent. If written prescriptions are taken to a different pharmacy, please inform the nursing staff. The pharmacy listed in the electronic medical record should be the one where you would like electronic prescriptions to be sent.  Prescription refills: Only during scheduled appointments. Applies to both, written and electronic prescriptions.  NOTE: The following applies primarily to controlled substances (Opioid* Pain Medications).   Patient's responsibilities: 1. Pain Pills: Bring all pain pills to every appointment (except for procedure appointments). 2. Pill Bottles: Bring pills in original pharmacy bottle. Always bring newest bottle. Bring bottle, even if empty. 3. Medication refills: You are responsible for knowing and keeping track of what medications you need refilled. The day before your appointment, write a list of all prescriptions that need to be refilled. Bring that list to your appointment and give it to the admitting nurse. Prescriptions will be written only during appointments. If you forget a medication, it will not be "Called in", "Faxed", or "electronically sent". You will need to get another appointment to get these prescribed. 4. Prescription Accuracy: You are responsible for carefully inspecting your prescriptions before leaving our office. Have the discharge nurse carefully go over each prescription with you, before taking them home. Make sure that your name is accurately spelled, that your address is correct. Check the name and dose of your medication to make sure it is accurate. Check the number of pills, and the written instructions to make sure they are clear and accurate. Make sure that you are given enough medication to  last until your next medication refill appointment. 5. Taking Medication: Take medication as prescribed. Never take more pills than instructed. Never take medication more frequently than prescribed. Taking less pills or less frequently is permitted and encouraged, when it comes to controlled substances (written prescriptions).  6. Inform other Doctors: Always inform, all of your healthcare providers, of all the medications you take. 7. Pain Medication from other Providers: You are not allowed to accept any additional pain medication from any other Doctor or Healthcare provider. There are two exceptions to this rule. (see below) In the event that you require additional pain medication, you are responsible for notifying us, as stated below. 8. Medication Agreement: You are responsible for carefully reading and following our Medication Agreement. This must be signed before receiving any prescriptions from our practice. Safely store a copy of your signed Agreement. Violations to the Agreement will result in no further prescriptions. (Additional copies of our Medication Agreement are available upon request.) 9. Laws, Rules, & Regulations: All patients are expected to follow all Federal and State Laws, Statutes, Rules, & Regulations. Ignorance of the Laws does not constitute a valid excuse. The use of any illegal substances is prohibited. 10. Adopted CDC guidelines & recommendations: Target dosing levels will be at or below 60 MME/day. Use of benzodiazepines** is not recommended.  Exceptions: There are only two exceptions to the rule of not receiving pain medications from other Healthcare Providers. 1. Exception #1 (Emergencies): In the event of an emergency (i.e.: accident requiring emergency care), you are allowed to receive additional pain medication. However, you are responsible for: As soon as you are able, call our office [phone], at any time of the day or night, and leave a message stating your  name, the date and nature of the emergency, and the name and dose of the medication   prescribed. In the event that your call is answered by a member of our staff, make sure to document and save the date, time, and the name of the person that took your information.  2. Exception #2 (Planned Surgery): In the event that you are scheduled by another doctor or dentist to have any type of surgery or procedure, you are allowed (for a period no longer than 30 days), to receive additional pain medication, for the acute post-op pain. However, in this case, you are responsible for picking up a copy of our "Post-op Pain Management for Surgeons" handout, and giving it to your surgeon or dentist. This document is available at our office, and does not require an appointment to obtain it. Simply go to our office during business hours (Monday-Thursday from 8:00 AM to 4:00 PM) (Friday 8:00 AM to 12:00 Noon) or if you have a scheduled appointment with [place], prior to your surgery, and ask for it by name. In addition, you will need to provide [place] with your name, name of your surgeon, type of surgery, and date of procedure or surgery.  *Opioid medications include: morphine, codeine, oxycodone, oxymorphone, hydrocodone, hydromorphone, meperidine, tramadol, tapentadol, buprenorphine, fentanyl, methadone. **Benzodiazepine medications include: diazepam (Valium), alprazolam (Xanax), clonazepam (Klonopine), lorazepam (Ativan), clorazepate (Tranxene), chlordiazepoxide (Librium), estazolam (Prosom), oxazepam (Serax), temazepam (Restoril), triazolam (Halcion)  ____________________________________________________________________________________________   BMI interpretation table: BMI level Category Range association with higher incidence of chronic pain  <18 kg/m2 Underweight   18.5-24.9 kg/m2 Ideal body weight   25-29.9 kg/m2 Overweight Increased incidence by 20%  30-34.9 kg/m2 Obese (Class I) Increased incidence by 68%  35-39.9 kg/m2  Severe obesity (Class II) Increased incidence by 136%  >40 kg/m2 Extreme obesity (Class III) Increased incidence by 254%   BMI Readings from Last 4 Encounters:  10/14/17 38.09 kg/m  08/12/17 39.56 kg/m  08/11/17 38.97 kg/m  07/22/17 38.62 kg/m   Wt Readings from Last 4 Encounters:  10/14/17 215 lb (97.5 kg)  08/12/17 223 lb 4.8 oz (101.3 kg)  08/11/17 220 lb (99.8 kg)  07/22/17 218 lb (98.9 kg)  GENERAL RISKS AND COMPLICATIONS  What are the risk, side effects and possible complications? Generally speaking, most procedures are safe.  However, with any procedure there are risks, side effects, and the possibility of complications.  The risks and complications are dependent upon the sites that are lesioned, or the type of nerve block to be performed.  The closer the procedure is to the spine, the more serious the risks are.  Great care is taken when placing the radio frequency needles, block needles or lesioning probes, but sometimes complications can occur. 1. Infection: Any time there is an injection through the skin, there is a risk of infection.  This is why sterile conditions are used for these blocks.  There are four possible types of infection. 1. Localized skin infection. 2. Central Nervous System Infection-This can be in the form of Meningitis, which can be deadly. 3. Epidural Infections-This can be in the form of an epidural abscess, which can cause pressure inside of the spine, causing compression of the spinal cord with subsequent paralysis. This would require an emergency surgery to decompress, and there are no guarantees that the patient would recover from the paralysis. 4. Discitis-This is an infection of the intervertebral discs.  It occurs in about 1% of discography procedures.  It is difficult to treat and it may lead to surgery.        2. Pain: the needles have to  go through skin and soft tissues, will cause soreness.       3. Damage to internal structures:  The nerves to  be lesioned may be near blood vessels or    other nerves which can be potentially damaged.       4. Bleeding: Bleeding is more common if the patient is taking blood thinners such as  aspirin, Coumadin, Ticiid, Plavix, etc., or if he/she have some genetic predisposition  such as hemophilia. Bleeding into the spinal canal can cause compression of the spinal  cord with subsequent paralysis.  This would require an emergency surgery to  decompress and there are no guarantees that the patient would recover from the  paralysis.       5. Pneumothorax:  Puncturing of a lung is a possibility, every time a needle is introduced in  the area of the chest or upper back.  Pneumothorax refers to free air around the  collapsed lung(s), inside of the thoracic cavity (chest cavity).  Another two possible  complications related to a similar event would include: Hemothorax and Chylothorax.   These are variations of the Pneumothorax, where instead of air around the collapsed  lung(s), you may have blood or chyle, respectively.       6. Spinal headaches: They may occur with any procedures in the area of the spine.       7. Persistent CSF (Cerebro-Spinal Fluid) leakage: This is a rare problem, but may occur  with prolonged intrathecal or epidural catheters either due to the formation of a fistulous  track or a dural tear.       8. Nerve damage: By working so close to the spinal cord, there is always a possibility of  nerve damage, which could be as serious as a permanent spinal cord injury with  paralysis.       9. Death:  Although rare, severe deadly allergic reactions known as "Anaphylactic  reaction" can occur to any of the medications used.      10. Worsening of the symptoms:  We can always make thing worse.  What are the chances of something like this happening? Chances of any of this occuring are extremely low.  By statistics, you have more of a chance of getting killed in a motor vehicle accident: while driving to the  hospital than any of the above occurring .  Nevertheless, you should be aware that they are possibilities.  In general, it is similar to taking a shower.  Everybody knows that you can slip, hit your head and get killed.  Does that mean that you should not shower again?  Nevertheless always keep in mind that statistics do not mean anything if you happen to be on the wrong side of them.  Even if a procedure has a 1 (one) in a 1,000,000 (million) chance of going wrong, it you happen to be that one..Also, keep in mind that by statistics, you have more of a chance of having something go wrong when taking medications.  Who should not have this procedure? If you are on a blood thinning medication (e.g. Coumadin, Plavix, see list of "Blood Thinners"), or if you have an active infection going on, you should not have the procedure.  If you are taking any blood thinners, please inform your physician.  How should I prepare for this procedure?  Do not eat or drink anything at least six hours prior to the procedure.  Bring a driver with you .  It cannot  be a taxi.  Come accompanied by an adult that can drive you back, and that is strong enough to help you if your legs get weak or numb from the local anesthetic.  Take all of your medicines the morning of the procedure with just enough water to swallow them.  If you have diabetes, make sure that you are scheduled to have your procedure done first thing in the morning, whenever possible.  If you have diabetes, take only half of your insulin dose and notify our nurse that you have done so as soon as you arrive at the clinic.  If you are diabetic, but only take blood sugar pills (oral hypoglycemic), then do not take them on the morning of your procedure.  You may take them after you have had the procedure.  Do not take aspirin or any aspirin-containing medications, at least eleven (11) days prior to the procedure.  They may prolong bleeding.  Wear loose fitting  clothing that may be easy to take off and that you would not mind if it got stained with Betadine or blood.  Do not wear any jewelry or perfume  Remove any nail coloring.  It will interfere with some of our monitoring equipment.  NOTE: Remember that this is not meant to be interpreted as a complete list of all possible complications.  Unforeseen problems may occur.  BLOOD THINNERS The following drugs contain aspirin or other products, which can cause increased bleeding during surgery and should not be taken for 2 weeks prior to and 1 week after surgery.  If you should need take something for relief of minor pain, you may take acetaminophen which is found in Tylenol,m Datril, Anacin-3 and Panadol. It is not blood thinner. The products listed below are.  Do not take any of the products listed below in addition to any listed on your instruction sheet.  A.P.C or A.P.C with Codeine Codeine Phosphate Capsules #3 Ibuprofen Ridaura  ABC compound Congesprin Imuran rimadil  Advil Cope Indocin Robaxisal  Alka-Seltzer Effervescent Pain Reliever and Antacid Coricidin or Coricidin-D  Indomethacin Rufen  Alka-Seltzer plus Cold Medicine Cosprin Ketoprofen S-A-C Tablets  Anacin Analgesic Tablets or Capsules Coumadin Korlgesic Salflex  Anacin Extra Strength Analgesic tablets or capsules CP-2 Tablets Lanoril Salicylate  Anaprox Cuprimine Capsules Levenox Salocol  Anexsia-D Dalteparin Magan Salsalate  Anodynos Darvon compound Magnesium Salicylate Sine-off  Ansaid Dasin Capsules Magsal Sodium Salicylate  Anturane Depen Capsules Marnal Soma  APF Arthritis pain formula Dewitt's Pills Measurin Stanback  Argesic Dia-Gesic Meclofenamic Sulfinpyrazone  Arthritis Bayer Timed Release Aspirin Diclofenac Meclomen Sulindac  Arthritis pain formula Anacin Dicumarol Medipren Supac  Analgesic (Safety coated) Arthralgen Diffunasal Mefanamic Suprofen  Arthritis Strength Bufferin Dihydrocodeine Mepro Compound Suprol   Arthropan liquid Dopirydamole Methcarbomol with Aspirin Synalgos  ASA tablets/Enseals Disalcid Micrainin Tagament  Ascriptin Doan's Midol Talwin  Ascriptin A/D Dolene Mobidin Tanderil  Ascriptin Extra Strength Dolobid Moblgesic Ticlid  Ascriptin with Codeine Doloprin or Doloprin with Codeine Momentum Tolectin  Asperbuf Duoprin Mono-gesic Trendar  Aspergum Duradyne Motrin or Motrin IB Triminicin  Aspirin plain, buffered or enteric coated Durasal Myochrisine Trigesic  Aspirin Suppositories Easprin Nalfon Trillsate  Aspirin with Codeine Ecotrin Regular or Extra Strength Naprosyn Uracel  Atromid-S Efficin Naproxen Ursinus  Auranofin Capsules Elmiron Neocylate Vanquish  Axotal Emagrin Norgesic Verin  Azathioprine Empirin or Empirin with Codeine Normiflo Vitamin E  Azolid Emprazil Nuprin Voltaren  Bayer Aspirin plain, buffered or children's or timed BC Tablets or powders Encaprin Orgaran Warfarin Sodium  Buff-a-Comp  Enoxaparin Orudis Zorpin  Buff-a-Comp with Codeine Equegesic Os-Cal-Gesic   Buffaprin Excedrin plain, buffered or Extra Strength Oxalid   Bufferin Arthritis Strength Feldene Oxphenbutazone   Bufferin plain or Extra Strength Feldene Capsules Oxycodone with Aspirin   Bufferin with Codeine Fenoprofen Fenoprofen Pabalate or Pabalate-SF   Buffets II Flogesic Panagesic   Buffinol plain or Extra Strength Florinal or Florinal with Codeine Panwarfarin   Buf-Tabs Flurbiprofen Penicillamine   Butalbital Compound Four-way cold tablets Penicillin   Butazolidin Fragmin Pepto-Bismol   Carbenicillin Geminisyn Percodan   Carna Arthritis Reliever Geopen Persantine   Carprofen Gold's salt Persistin   Chloramphenicol Goody's Phenylbutazone   Chloromycetin Haltrain Piroxlcam   Clmetidine heparin Plaquenil   Cllnoril Hyco-pap Ponstel   Clofibrate Hydroxy chloroquine Propoxyphen         Before stopping any of these medications, be sure to consult the physician who ordered them.  Some, such as  Coumadin (Warfarin) are ordered to prevent or treat serious conditions such as "deep thrombosis", "pumonary embolisms", and other heart problems.  The amount of time that you may need off of the medication may also vary with the medication and the reason for which you were taking it.  If you are taking any of these medications, please make sure you notify your pain physician before you undergo any procedures.         Sacroiliac (SI) Joint Injection Patient Information  Description: The sacroiliac joint connects the scrum (very low back and tailbone) to the ilium (a pelvic bone which also forms half of the hip joint).  Normally this joint experiences very little motion.  When this joint becomes inflamed or unstable low back and or hip and pelvis pain may result.  Injection of this joint with local anesthetics (numbing medicines) and steroids can provide diagnostic information and reduce pain.  This injection is performed with the aid of x-ray guidance into the tailbone area while you are lying on your stomach.   You may experience an electrical sensation down the leg while this is being done.  You may also experience numbness.  We also may ask if we are reproducing your normal pain during the injection.  Conditions which may be treated SI injection:   Low back, buttock, hip or leg pain  Preparation for the Injection:  3. Do not eat any solid food or dairy products within 8 hours of your appointment.  4. You may drink clear liquids up to 3 hours before appointment.  Clear liquids include water, black coffee, juice or soda.  No milk or cream please. 5. You may take your regular medications, including pain medications with a sip of water before your appointment.  Diabetics should hold regular insulin (if take separately) and take 1/2 normal NPH dose the morning of the procedure.  Carry some sugar containing items with you to your appointment. 6. A driver must accompany you and be prepared to  drive you home after your procedure. 7. Bring all of your current medications with you. 8. An IV may be inserted and sedation may be given at the discretion of the physician. 9. A blood pressure cuff, EKG and other monitors will often be applied during the procedure.  Some patients may need to have extra oxygen administered for a short period.  10. You will be asked to provide medical information, including your allergies, prior to the procedure.  We must know immediately if you are taking blood thinners (like Coumadin/Warfarin) or if you are allergic to IV iodine  contrast (dye).  We must know if you could possible be pregnant.  Possible side effects:   Bleeding from needle site  Infection (rare, may require surgery)  Nerve injury (rare)  Numbness & tingling (temporary)  A brief convulsion or seizure  Light-headedness (temporary)  Pain at injection site (several days)  Decreased blood pressure (temporary)  Weakness in the leg (temporary)   Call if you experience:   New onset weakness or numbness of an extremity below the injection site that last more than 8 hours.  Hives or difficulty breathing ( go to the emergency room)  Inflammation or drainage at the injection site  Any new symptoms which are concerning to you  Please note:  Although the local anesthetic injected can often make your back/ hip/ buttock/ leg feel good for several hours after the injections, the pain will likely return.  It takes 3-7 days for steroids to work in the sacroiliac area.  You may not notice any pain relief for at least that one week.  If effective, we will often do a series of three injections spaced 3-6 weeks apart to maximally decrease your pain.  After the initial series, we generally will wait some months before a repeat injection of the same type.  If you have any questions, please call [phone] [facility]   Facet Joint Block The facet joints  connect the bones of the spine (vertebrae). They make it possible for you to bend, twist, and make other movements with your spine. They also keep you from bending too far, twisting too far, and making other excessive movements. A facet joint block is a procedure where a numbing medicine (anesthetic) is injected into a facet joint. Often, a type of anti-inflammatory medicine called a steroid is also injected. A facet joint block may be done to diagnose neck or back pain. If the pain gets better after a facet joint block, it means the pain is probably coming from the facet joint. If the pain does not get better, it means the pain is probably not coming from the facet joint. A facet joint block may also be done to relieve neck or back pain caused by an inflamed facet joint. A facet joint block is only done to relieve pain if the pain does not improve with other methods, such as medicine, exercise programs, and physical therapy. Tell a health care provider about:  Any allergies you have.  All medicines you are taking, including vitamins, herbs, eye drops, creams, and over-the-counter medicines.  Any problems you or family members have had with anesthetic medicines.  Any blood disorders you have.  Any surgeries you have had.  Any medical conditions you have.  Whether you are pregnant or may be pregnant. What are the risks? Generally, this is a safe procedure. However, problems may occur, including:  Bleeding.  Injury to a nerve near the injection site.  Pain at the injection site.  Weakness or numbness in areas controlled by nerves near the injection site.  Infection.  Temporary fluid retention.  Allergic reactions to medicines or dyes.  Injury to other structures or organs near the injection site.  What happens before the procedure?  Follow instructions from your health care provider about eating or drinking restrictions.  Ask your health care provider about: ? Changing or  stopping your regular medicines. This is especially important if you are taking diabetes medicines or blood thinners. ? Taking medicines such as aspirin and ibuprofen. These medicines  can thin your blood. Do not take these medicines before your procedure if your health care provider instructs you not to.  Do not take any new dietary supplements or medicines without asking your health care provider first.  Plan to have someone take you home after the procedure. What happens during the procedure?  You may need to remove your clothing and dress in an open-back gown.  The procedure will be done while you are lying on an X-ray table. You will most likely be asked to lie on your stomach, but you may be asked to lie in a different position if an injection will be made in your neck.  Machines will be used to monitor your oxygen levels, heart rate, and blood pressure.  If an injection will be made in your neck, an IV tube will be inserted into one of your veins. Fluids and medicine will flow directly into your body through the IV tube.  The area over the facet joint where the injection will be made will be cleaned with soap. The surrounding skin will be covered with clean drapes.  A numbing medicine (local anesthetic) will be applied to your skin. Your skin may sting or burn for a moment.  A video X-ray machine (fluoroscopy) will be used to locate the joint. In some cases, a CT scan may be used.  A contrast dye may be injected into the facet joint area to help locate the joint.  When the joint is located, an anesthetic will be injected into the joint through the needle.  Your health care provider will ask you whether you feel pain relief. If you do feel relief, a steroid may be injected to provide pain relief for a longer period of time. If you do not feel relief or feel only partial relief, additional injections of an anesthetic may be made in other facet joints.  The needle will be  removed.  Your skin will be cleaned.  A bandage (dressing) will be applied over each injection site. The procedure may vary among health care providers and hospitals. What happens after the procedure?  You will be observed for 15-30 minutes before being allowed to go home. This information is not intended to replace advice given to you by your health care provider. Make sure you discuss any questions you have with your health care provider. Document Released: 02/05/2007 Document Revised: 10/18/2015 Document Reviewed: 06/12/2015 Elsevier Interactive Patient Education  2018 [place].  Epidural Steroid Injection An epidural steroid injection is a shot of steroid medicine and numbing medicine that is given into the space between the spinal cord and the bones in your back (epidural space). The shot helps relieve pain caused by an irritated or swollen nerve root. The amount of pain relief you get from the injection depends on what is causing the nerve to be swollen and irritated, and how long your pain lasts. You are more likely to benefit from this injection if your pain is strong and comes on suddenly rather than if you have had pain for a long time. Tell a health care provider about:  Any allergies you have.  All medicines you are taking, including vitamins, herbs, eye drops, creams, and over-the-counter medicines.  Any problems you or family members have had with anesthetic medicines.  Any blood disorders you have.  Any surgeries you have had.  Any medical conditions you have.  Whether you are pregnant or may be pregnant. What are the risks? Generally, this is a safe  procedure. However, problems may occur, including:  Headache.  Bleeding.  Infection.  Allergic reaction to medicines.  Damage to your nerves.  What happens before the procedure? Staying hydrated Follow instructions from your health care provider about hydration, which may include:  Up to 2 hours before  the procedure - you may continue to drink clear liquids, such as water, clear fruit juice, black coffee, and plain tea.  Eating and drinking restrictions Follow instructions from your health care provider about eating and drinking, which may include:  8 hours before the procedure - stop eating heavy meals or foods such as meat, fried foods, or fatty foods.  6 hours before the procedure - stop eating light meals or foods, such as toast or cereal.  6 hours before the procedure - stop drinking milk or drinks that contain milk.  2 hours before the procedure - stop drinking clear liquids.  Medicine  You may be given medicines to lower anxiety.  Ask your health care provider about: ? Changing or stopping your regular medicines. This is especially important if you are taking diabetes medicines or blood thinners. ? Taking medicines such as aspirin and ibuprofen. These medicines can thin your blood. Do not take these medicines before your procedure if your health care provider instructs you not to. General instructions  Plan to have someone take you home from the hospital or clinic. What happens during the procedure?  You may receive a medicine to help you relax (sedative).  You will be asked to lie on your abdomen.  The injection site will be cleaned.  A numbing medicine (local anesthetic) will be used to numb the injection site.  A needle will be inserted through your skin into the epidural space. You may feel some discomfort when this happens. An X-ray machine will be used to make sure the needle is put as close as possible to the affected nerve.  A steroid medicine and a local anesthetic will be injected into the epidural space.  The needle will be removed.  A bandage (dressing) will be put over the injection site. What happens after the procedure?  Your blood pressure, heart rate, breathing rate, and blood oxygen level will be monitored until the medicines you were given have  worn off.  Your arm or leg may feel weak or numb for a few hours.  The injection site may feel sore.  Do not drive for 24 hours if you received a sedative. This information is not intended to replace advice given to you by your health care provider. Make sure you discuss any questions you have with your health care provider. Document Released: 12/24/2007 Document Revised: 02/28/2016 Document Reviewed: 01/02/2016 Elsevier Interactive Patient Education  [employer].

## 2017-10-23 ENCOUNTER — Telehealth: Payer: Self-pay | Admitting: Nurse Practitioner

## 2017-10-23 NOTE — Telephone Encounter (Signed)
Patient called stating her meds need prior auth and was supposed to have already been done.  Please check and let patient know.  Also states concern over her AVS and accuracy, says she discussed some things with C Edison Pace that were not discussed and that she had a procedure which she did not have that day on 10-14-17. Please discuss with patient

## 2017-10-24 NOTE — Telephone Encounter (Signed)
Patient advised that PA request for Lyrica was sent on 10-23-17. Also instructed to disregard incorrect information in AVS.

## 2017-11-16 NOTE — Progress Notes (Signed)
Closing out lab/order note open since:  2018 

## 2017-11-16 NOTE — Progress Notes (Signed)
Closing out lab/order note open since:  July 2018 

## 2017-11-17 DIAGNOSIS — M25512 Pain in left shoulder: Secondary | ICD-10-CM | POA: Insufficient documentation

## 2017-11-17 NOTE — Progress Notes (Signed)
Patient's Name: Dana Bradley  MRN: [id number]  Referring Provider: Milinda Pointer, MD  DOB: 12/02/1970  PCP: Arnetha Courser, MD  DOS: 11/18/2017  Note by: Gaspar Cola, MD  Service setting: Ambulatory outpatient  Specialty: Interventional Pain Management  Patient type: Established  Location: [facility] (AMB) Pain Management Facility  Visit type: Interventional Procedure   Primary Reason for Visit: Interventional Pain Management Treatment. CC: Headache  Procedure:       Anesthesia, Analgesia, Anxiolysis:  Type: Trigger Point Injection (1-2 muscle groups) CPT: 20552 Primary Purpose: Therapeutic Region: Posterior Cervicothoracic Level: Cervico-thoracic Target Area: Trigger Point Approach: Ipsilateral approach. Laterality: Bilateral   Position: Sitting  Type: Local Anesthesia Local Anesthetic: Lidocaine 1% Route: Infiltration ([place]/IM) IV Access: Declined Sedation: Declined  Indication(s): Analgesia           Indications: 1. Myofascial pain syndrome (Left) (trapezius muscle)   2. Trigger point of shoulder region (Left)   3. Chronic upper back pain (Secondary source of pain) (Bilateral) (L>R)    Pain Score: Pre-procedure: 1 /10 Post-procedure: 1 /10  Pre-op Assessment:  Dana Bradley is a 47 y.o. (year old), female patient, seen today for interventional treatment. She  has a past surgical history that includes Ablation; Tubal ligation (10/01/99); Cardiac catheterization; Knee arthroscopy; Colonoscopy with propofol (N/A, 05/17/2015); Esophagogastroduodenoscopy (N/A, 05/17/2015); and spg. Dana Bradley has a current medication list which includes the following prescription(s): acetaminophen, baclofen, calcium-vitamin d-vitamin k, clonazepam, cyanocobalamin, diclofenac sodium, fremanezumab-vfrm, gemfibrozil, hydrocodone-acetaminophen, metoprolol tartrate, ondansetron, pregabalin, venlafaxine xr, and oxcarbazepine, and the following Facility-Administered Medications: lidocaine, ropivacaine (pf) 2  mg/ml (0.2%), and triamcinolone acetonide. Her primarily concern today is the Headache  Initial Vital Signs:  Pulse Rate: 75 Temp: 98.3 F (36.8 C) Resp: 16 BP: 119/73 SpO2: 100 %  BMI: Estimated body mass index is 37.55 kg/m as calculated from the following:   Height as of this encounter: 5\' 3"  (1.6 m).   Weight as of this encounter: 212 lb (96.2 kg).  Risk Assessment: Allergies: Reviewed. She is allergic to aspirin; cymbalta [duloxetine hcl]; demerol [meperidine]; depakote [divalproex sodium]; duloxetine; haloperidol; iodinated diagnostic agents; iodine; reglan [metoclopramide]; tramadol hcl; trazodone; compazine [prochlorperazine]; meloxicam; penicillins; tomato; shellfish allergy; shellfish-derived products; bacitracin-neomycin-polymyxin; cephalosporins; ibuprofen; latex; neosporin [neomycin-bacitracin zn-polymyx]; nsaids; sulfasalazine; and sulfonamide derivatives.  Allergy Precautions: No iodine containing solutions or radiological contrast used. Coagulopathies: Reviewed. None identified.  Blood-thinner therapy: None at this time Active Infection(s): Reviewed. None identified. Dana Bradley is afebrile  Site Confirmation: Dana Bradley was asked to confirm the procedure and laterality before marking the site Procedure checklist: Completed Consent: Before the procedure and under the influence of no sedative(s), amnesic(s), or anxiolytics, the patient was informed of the treatment options, risks and possible complications. To fulfill our ethical and legal obligations, as recommended by the American Medical Association's Code of Ethics, I have informed the patient of my clinical impression; the nature and purpose of the treatment or procedure; the risks, benefits, and possible complications of the intervention; the alternatives, including doing nothing; the risk(s) and benefit(s) of the alternative treatment(s) or procedure(s); and the risk(s) and benefit(s) of doing nothing. The patient was  provided information about the general risks and possible complications associated with the procedure. These may include, but are not limited to: failure to achieve desired goals, infection, bleeding, organ or nerve damage, allergic reactions, paralysis, and death. In addition, the patient was informed of those risks and complications associated to the procedure, such as failure to decrease pain; infection; bleeding; organ or nerve damage  with subsequent damage to sensory, motor, and/or autonomic systems, resulting in permanent pain, numbness, and/or weakness of one or several areas of the body; allergic reactions; (i.e.: anaphylactic reaction); and/or death. Furthermore, the patient was informed of those risks and complications associated with the medications. These include, but are not limited to: allergic reactions (i.e.: anaphylactic or anaphylactoid reaction(s)); adrenal axis suppression; blood sugar elevation that in diabetics may result in ketoacidosis or comma; water retention that in patients with history of congestive heart failure may result in shortness of breath, pulmonary edema, and decompensation with resultant heart failure; weight gain; swelling or edema; medication-induced neural toxicity; particulate matter embolism and blood vessel occlusion with resultant organ, and/or nervous system infarction; and/or aseptic necrosis of one or more joints. Finally, the patient was informed that Medicine is not an exact science; therefore, there is also the possibility of unforeseen or unpredictable risks and/or possible complications that may result in a catastrophic outcome. The patient indicated having understood very clearly. We have given the patient no guarantees and we have made no promises. Enough time was given to the patient to ask questions, all of which were answered to the patient's satisfaction. Dana Bradley has indicated that she wanted to continue with the procedure. Attestation: I, the  ordering provider, attest that I have discussed with the patient the benefits, risks, side-effects, alternatives, likelihood of achieving goals, and potential problems during recovery for the procedure that I have provided informed consent. Date  Time: 11/18/2017 12:34 PM  Pre-Procedure Preparation:  Monitoring: As per clinic protocol. Respiration, ETCO2, SpO2, BP, heart rate and rhythm monitor placed and checked for adequate function Safety Precautions: Patient was assessed for positional comfort and pressure points before starting the procedure. Time-out: I initiated and conducted the "Time-out" before starting the procedure, as per protocol. The patient was asked to participate by confirming the accuracy of the "Time Out" information. Verification of the correct person, site, and procedure were performed and confirmed by me, the nursing staff, and the patient. "Time-out" conducted as per Joint Commission's Universal Protocol (UP.01.01.01). Time: 1310  Description of Procedure Process:   Area Prepped: Entire Posterior Cervicothoracic Region Prepping solution: ChloraPrep (2% chlorhexidine gluconate and 70% isopropyl alcohol) Safety Precautions: Aspiration looking for blood return was conducted prior to all injections. At no point did we inject any substances, as a needle was being advanced. No attempts were made at seeking any paresthesias. Safe injection practices and needle disposal techniques used. Medications properly checked for expiration dates. SDV (single dose vial) medications used. Description of the Procedure: Protocol guidelines were followed. The patient was placed in position over the fluoroscopy table. The target area was identified and the area prepped in the usual manner. Skin desensitized using vapocoolant spray. Skin & deeper tissues infiltrated with local anesthetic. Appropriate amount of time allowed to pass for local anesthetics to take effect. The procedure needles were then  advanced to the target area. Proper needle placement secured. Negative aspiration confirmed. Solution injected in intermittent fashion, asking for systemic symptoms every 0.5cc of injectate. The needles were then removed and the area cleansed, making sure to leave some of the prepping solution back to take advantage of its long term bactericidal properties. Vitals:   11/18/17 1231 11/18/17 1315  BP: 119/73 111/78  Pulse: 75 68  Resp: 16 18  Temp: 98.3 F (36.8 C)   SpO2: 100% 99%  Weight: 212 lb (96.2 kg)   Height: 5\' 3"  (1.6 m)     Start Time: 1310 hrs.  End Time: 1312 hrs. Materials:  Needle(s) Type: Epidural needle Gauge: 25G Length: 1.5-in Medication(s): Please see orders for medications and dosing details.  Imaging Guidance:  Type of Imaging Technique: None used Indication(s): N/A Exposure Time: No patient exposure Contrast: None used. Fluoroscopic Guidance: N/A Ultrasound Guidance: N/A Interpretation: N/A  Antibiotic Prophylaxis:   Anti-infectives (From admission, onward)   None     Indication(s): None identified  Post-operative Assessment:  Post-procedure Vital Signs:  Pulse Rate: 68 Temp: 98.3 F (36.8 C) Resp: 18 BP: 111/78 SpO2: 99 %  EBL: None  Complications: No immediate post-treatment complications observed by team, or reported by patient.  Note: The patient tolerated the entire procedure well. A repeat set of vitals were taken after the procedure and the patient was kept under observation following institutional policy, for this type of procedure. Post-procedural neurological assessment was performed, showing return to baseline, prior to discharge. The patient was provided with post-procedure discharge instructions, including a section on how to identify potential problems. Should any problems arise concerning this procedure, the patient was given instructions to immediately contact us, at any time, without hesitation. In any case, we plan to contact the  patient by telephone for a follow-up status report regarding this interventional procedure.  Comments:  No additional relevant information.  Plan of Care   Imaging Orders  No imaging studies ordered today    Procedure Orders     TRIGGER POINT INJECTION     Lumbar Epidural Injection     Occipital Nerve Radiofrequency  Medications ordered for procedure: Meds ordered this encounter  Medications  . lidocaine (XYLOCAINE) 2 % (with pres) injection 400 mg  . ropivacaine (PF) 2 mg/mL (0.2%) (NAROPIN) injection 9 mL  . triamcinolone acetonide (KENALOG-40) injection 40 mg   Medications administered: [name] had no medications administered during this visit.  See the medical record for exact dosing, route, and time of administration.  New Prescriptions   No medications on file   Disposition: Discharge home  Discharge Date & Time: 11/18/2017; 1320 hrs.   Physician-requested Follow-up: Return for Procedure (w/ sedation): (R) L4-5 LESI + post-procedure eval (2 wks), w/ Dr. Dossie Arbour.  Future Appointments  Date Time Provider [place]  11/20/2017  4:00 PM Arnetha Courser, MD [facility]  01/05/2018  8:45 AM Vevelyn Francois, NP [facility] None   Primary Care Physician: Arnetha Courser, MD Location: [facility] Out[facility] Management Facility Note by: Gaspar Cola, MD Date: 11/18/2017; Time: 1:19 PM  Disclaimer:  Medicine is not an Chief Strategy Officer. The only guarantee in medicine is that nothing is guaranteed. It is important to note that the decision to proceed with this intervention was based on the information collected from the patient. The Data and conclusions were drawn from the patient's questionnaire, the interview, and the physical examination. Because the information was provided in large part by the patient, it cannot be guaranteed that it has not been purposely or unconsciously manipulated. Every effort has been made to obtain as much relevant data as possible for this  evaluation. It is important to note that the conclusions that lead to this procedure are derived in large part from the available data. Always take into account that the treatment will also be dependent on availability of resources and existing treatment guidelines, considered by other Pain Management Practitioners as being common knowledge and practice, at the time of the intervention. For Medico-Legal purposes, it is also important to point out that variation in procedural techniques and  pharmacological choices are the acceptable norm. The indications, contraindications, technique, and results of the above procedure should only be interpreted and judged by a Board-Certified Interventional Pain Specialist with extensive familiarity and expertise in the same exact procedure and technique.

## 2017-11-18 ENCOUNTER — Encounter (INDEPENDENT_AMBULATORY_CARE_PROVIDER_SITE_OTHER): Payer: Self-pay

## 2017-11-18 ENCOUNTER — Ambulatory Visit: Payer: Medicaid Other | Attending: Pain Medicine | Admitting: Pain Medicine

## 2017-11-18 ENCOUNTER — Other Ambulatory Visit: Payer: Self-pay

## 2017-11-18 ENCOUNTER — Encounter: Payer: Self-pay | Admitting: Pain Medicine

## 2017-11-18 VITALS — BP 111/78 | HR 68 | Temp 98.3°F | Resp 18 | Ht 63.0 in | Wt 212.0 lb

## 2017-11-18 DIAGNOSIS — Z888 Allergy status to other drugs, medicaments and biological substances status: Secondary | ICD-10-CM | POA: Insufficient documentation

## 2017-11-18 DIAGNOSIS — M5136 Other intervertebral disc degeneration, lumbar region: Secondary | ICD-10-CM

## 2017-11-18 DIAGNOSIS — M5481 Occipital neuralgia: Secondary | ICD-10-CM

## 2017-11-18 DIAGNOSIS — M7918 Myalgia, other site: Secondary | ICD-10-CM | POA: Insufficient documentation

## 2017-11-18 DIAGNOSIS — Z882 Allergy status to sulfonamides status: Secondary | ICD-10-CM | POA: Diagnosis not present

## 2017-11-18 DIAGNOSIS — Z881 Allergy status to other antibiotic agents status: Secondary | ICD-10-CM | POA: Diagnosis not present

## 2017-11-18 DIAGNOSIS — Z885 Allergy status to narcotic agent status: Secondary | ICD-10-CM | POA: Diagnosis not present

## 2017-11-18 DIAGNOSIS — Z91013 Allergy to seafood: Secondary | ICD-10-CM | POA: Insufficient documentation

## 2017-11-18 DIAGNOSIS — M5137 Other intervertebral disc degeneration, lumbosacral region: Secondary | ICD-10-CM

## 2017-11-18 DIAGNOSIS — Z886 Allergy status to analgesic agent status: Secondary | ICD-10-CM | POA: Insufficient documentation

## 2017-11-18 DIAGNOSIS — R252 Cramp and spasm: Secondary | ICD-10-CM

## 2017-11-18 DIAGNOSIS — M25512 Pain in left shoulder: Secondary | ICD-10-CM | POA: Insufficient documentation

## 2017-11-18 DIAGNOSIS — M5489 Other dorsalgia: Secondary | ICD-10-CM | POA: Diagnosis not present

## 2017-11-18 DIAGNOSIS — Z880 Allergy status to penicillin: Secondary | ICD-10-CM | POA: Diagnosis not present

## 2017-11-18 DIAGNOSIS — G8929 Other chronic pain: Secondary | ICD-10-CM | POA: Insufficient documentation

## 2017-11-18 DIAGNOSIS — M549 Dorsalgia, unspecified: Secondary | ICD-10-CM

## 2017-11-18 DIAGNOSIS — R51 Headache: Secondary | ICD-10-CM | POA: Diagnosis present

## 2017-11-18 MED ORDER — TRIAMCINOLONE ACETONIDE 40 MG/ML IJ SUSP
40.0000 mg | Freq: Once | INTRAMUSCULAR | Status: AC
  Administered 2017-11-18: 40 mg
  Filled 2017-11-18: qty 1

## 2017-11-18 MED ORDER — ROPIVACAINE HCL 2 MG/ML IJ SOLN
9.0000 mL | Freq: Once | INTRAMUSCULAR | Status: DC
  Filled 2017-11-18: qty 10

## 2017-11-18 MED ORDER — LIDOCAINE HCL 2 % IJ SOLN
20.0000 mL | Freq: Once | INTRAMUSCULAR | Status: AC
  Administered 2017-11-18: 400 mg
  Filled 2017-11-18: qty 20

## 2017-11-18 NOTE — Progress Notes (Signed)
Nursing Pain Medication Assessment:  Safety precautions to be maintained throughout the outpatient stay will include: orient to surroundings, keep bed in low position, maintain call bell within reach at all times, provide assistance with transfer out of bed and ambulation.  Medication Inspection Compliance: Pill count conducted under aseptic conditions, in front of the patient. Neither the pills nor the bottle was removed from the patient's sight at any time. Once count was completed pills were immediately returned to the patient in their original bottle.  Medication: Hydrocodone/APAP Pill/Patch Count: 2 of 30 pills remain Pill/Patch Appearance: Markings consistent with prescribed medication Bottle Appearance: Standard pharmacy container. Clearly labeled. Filled Date: 11 /12 / 2018 Last Medication intake:  Ran out of medicine more than 48 hours ago

## 2017-11-18 NOTE — Progress Notes (Signed)
Safety precautions to be maintained throughout the outpatient stay will include: orient to surroundings, keep bed in low position, maintain call bell within reach at all times, provide assistance with transfer out of bed and ambulation.  

## 2017-11-18 NOTE — Patient Instructions (Addendum)
____________________________________________________________________________________________  Post-Procedure instructions Instructions:  Apply ice: Fill a plastic sandwich bag with crushed ice. Cover it with a small towel and apply to injection site. Apply for 15 minutes then remove x 15 minutes. Repeat sequence on day of procedure, until you go to bed. The purpose is to minimize swelling and discomfort after procedure.  Apply heat: Apply heat to procedure site starting the day following the procedure. The purpose is to treat any soreness and discomfort from the procedure.  Food intake: Start with clear liquids (like water) and advance to regular food, as tolerated.   Physical activities: Keep activities to a minimum for the first 8 hours after the procedure.   Driving: If you have received any sedation, you are not allowed to drive for 24 hours after your procedure.  Blood thinner: Restart your blood thinner 6 hours after your procedure. (Only for those taking blood thinners)  Insulin: As soon as you can eat, you may resume your normal dosing schedule. (Only for those taking insulin)  Infection prevention: Keep procedure site clean and dry.  Post-procedure Pain Diary: Extremely important that this be done correctly and accurately. Recorded information will be used to determine the next step in treatment.  Pain evaluated is that of treated area only. Do not include pain from an untreated area.  Complete every hour, on the hour, for the initial 8 hours. Set an alarm to help you do this part accurately.  Do not go to sleep and have it completed later. It will not be accurate.  Follow-up appointment: Keep your follow-up appointment after the procedure. Usually 2 weeks for most procedures. (6 weeks in the case of radiofrequency.) Bring you pain diary.  Expect:  From numbing medicine (AKA: Local Anesthetics): Numbness or decrease in pain.  Onset: Full effect within 15 minutes of  injected.  Duration: It will depend on the type of local anesthetic used. On the average, 1 to 8 hours.   From steroids: Decrease in swelling or inflammation. Once inflammation is improved, relief of the pain will follow.  Onset of benefits: Depends on the amount of swelling present. The more swelling, the longer it will take for the benefits to be seen. In some cases, up to 10 days.  Duration: Steroids will stay in the system x 2 weeks. Duration of benefits will depend on multiple posibilities including persistent irritating factors.  From procedure: Some discomfort is to be expected once the numbing medicine wears off. This should be minimal if ice and heat are applied as instructed. Call if:  You experience numbness and weakness that gets worse with time, as opposed to wearing off.  New onset bowel or bladder incontinence. (Spinal procedures only)  Emergency Numbers:  Durning business hours (Monday - Thursday, 8:00 AM - 4:00 PM) (Friday, 9:00 AM - 12:00 Noon): [phone]  After hours: [phone] ____________________________________________________________________________________________   ____________________________________________________________________________________________  Preparing for Procedure with Sedation Instructions: . Oral Intake: Do not eat or drink anything for at least 8 hours prior to your procedure. . Transportation: Public transportation is not allowed. Bring an adult driver. The driver must be physically present in our waiting room before any procedure can be started. Marland Kitchen Physical Assistance: Bring an adult physically capable of assisting you, in the event you need help. This adult should keep you company at home for at least 6 hours after the procedure. . Blood Pressure Medicine: Take your blood pressure medicine with a sip of water the morning of the procedure. Marland Kitchen  Blood thinners:  . Diabetics on insulin: Notify the staff so that you can be scheduled  1st case in the morning. If your diabetes requires high dose insulin, take only  of your normal insulin dose the morning of the procedure and notify the staff that you have done so. . Preventing infections: Shower with an antibacterial soap the morning of your procedure. . Build-up your immune system: Take 1000 mg of Vitamin C with every meal (3 times a day) the day prior to your procedure. Marland Kitchen Antibiotics: Inform the staff if you have a condition or reason that requires you to take antibiotics before dental procedures. . Pregnancy: If you are pregnant, call and cancel the procedure. . Sickness: If you have a cold, fever, or any active infections, call and cancel the procedure. . Arrival: You must be in the facility at least 30 minutes prior to your scheduled procedure. . Children: Do not bring children with you. . Dress appropriately: Bring dark clothing that you would not mind if they get stained. . Valuables: Do not bring any jewelry or valuables. Procedure appointments are reserved for interventional treatments only. Marland Kitchen No Prescription Refills. . No medication changes will be discussed during procedure appointments. . No disability issues will be discussed. ____________________________________________________________________________________________   Radiofrequency Lesioning Radiofrequency lesioning is a procedure that is performed to relieve pain. The procedure is often used for back, neck, or arm pain. Radiofrequency lesioning involves the use of a machine that creates radio waves to make heat. During the procedure, the heat is applied to the nerve that carries the pain signal. The heat damages the nerve and interferes with the pain signal. Pain relief usually starts about 2 weeks after the procedure and lasts for 6 months to 1 year. Tell a health care provider about:  Any allergies you have.  All medicines you are taking, including vitamins, herbs, eye drops, creams, and over-the-counter  medicines.  Any problems you or family members have had with anesthetic medicines.  Any blood disorders you have.  Any surgeries you have had.  Any medical conditions you have.  Whether you are pregnant or may be pregnant. What are the risks? Generally, this is a safe procedure. However, problems may occur, including:  Pain or soreness at the injection site.  Infection at the injection site.  Damage to nerves or blood vessels.  What happens before the procedure?  Ask your health care provider about: ? Changing or stopping your regular medicines. This is especially important if you are taking diabetes medicines or blood thinners. ? Taking medicines such as aspirin and ibuprofen. These medicines can thin your blood. Do not take these medicines before your procedure if your health care provider instructs you not to.  Follow instructions from your health care provider about eating or drinking restrictions.  Plan to have someone take you home after the procedure.  If you go home right after the procedure, plan to have someone with you for 24 hours. What happens during the procedure?  You will be given one or more of the following: ? A medicine to help you relax (sedative). ? A medicine to numb the area (local anesthetic).  You will be awake during the procedure. You will need to be able to talk with the health care provider during the procedure.  With the help of a type of X-ray (fluoroscopy), the health care provider will insert a radiofrequency needle into the area to be treated.  Next, a wire that carries the radio waves (  electrode) will be put through the radiofrequency needle. An electrical pulse will be sent through the electrode to verify the correct nerve. You will feel a tingling sensation, and you may have muscle twitching.  Then, the tissue that is around the needle tip will be heated by an electric current that is passed using the radiofrequency machine. This will  numb the nerves.  A bandage (dressing) will be put on the insertion area after the procedure is done. The procedure may vary among health care providers and hospitals. What happens after the procedure?  Your blood pressure, heart rate, breathing rate, and blood oxygen level will be monitored often until the medicines you were given have worn off.  Return to your normal activities as directed by your health care provider. This information is not intended to replace advice given to you by your health care provider. Make sure you discuss any questions you have with your health care provider. Document Released: 05/15/2011 Document Revised: 02/22/2016 Document Reviewed: 10/24/2014 Elsevier Interactive Patient Education  [employer].

## 2017-11-19 ENCOUNTER — Telehealth: Payer: Self-pay

## 2017-11-19 NOTE — Telephone Encounter (Signed)
Patient states that she is having some discomfort from the injection.  States it runs up her shoulder and into her right jaw.  States it has gotten better since yesterday, but has some discomfort when she chews.  Denies fever.  Encouraged to put heat on it today and to call us back if pain persists or increases.  Patinet states understanding.

## 2017-11-20 ENCOUNTER — Encounter: Payer: Self-pay | Admitting: Family Medicine

## 2017-11-20 ENCOUNTER — Ambulatory Visit: Payer: Medicaid Other | Admitting: Family Medicine

## 2017-11-20 ENCOUNTER — Telehealth: Payer: Self-pay | Admitting: *Deleted

## 2017-11-20 VITALS — BP 118/82 | HR 82 | Temp 98.1°F | Resp 16 | Wt 216.5 lb

## 2017-11-20 DIAGNOSIS — I471 Supraventricular tachycardia: Secondary | ICD-10-CM

## 2017-11-20 DIAGNOSIS — E538 Deficiency of other specified B group vitamins: Secondary | ICD-10-CM

## 2017-11-20 DIAGNOSIS — E559 Vitamin D deficiency, unspecified: Secondary | ICD-10-CM

## 2017-11-20 DIAGNOSIS — R079 Chest pain, unspecified: Secondary | ICD-10-CM | POA: Diagnosis not present

## 2017-11-20 DIAGNOSIS — R5383 Other fatigue: Secondary | ICD-10-CM

## 2017-11-20 DIAGNOSIS — R0602 Shortness of breath: Secondary | ICD-10-CM

## 2017-11-20 DIAGNOSIS — E782 Mixed hyperlipidemia: Secondary | ICD-10-CM

## 2017-11-20 NOTE — Progress Notes (Signed)
BP 118/82   Pulse 82   Temp 98.1 F (36.7 C) (Oral)   Resp 16   Wt 216 lb 8 oz (98.2 kg)   LMP 10/20/2017 (Exact Date)   SpO2 97%   BMI 38.35 kg/m    Subjective:    Patient ID: Dana Bradley, female    DOB: 08/29/1971, 47 y.o.   MRN: [id number]  HPI: Dana Bradley is a 47 y.o. female  Chief Complaint  Patient presents with  . Fatigue    complications with ADL, excersie made difficult  . Headache  . Chest Pain    SOB  . Medication Refill    acid reflux meds  . Pruritis    in the groves along the groin area.    HPI  Patient is here for multiple issues  Chest pain; feels like it is pounding and hard; like something is trying to knock through her chest; fast and hard and out of rhythm; saw cardiologist, Dr. Nehemiah Massed; wore holter monitor; did not show anything, was not walking around; about 3-4 x week; exercise actually makes this worse; hurts, feels like it a cramp, over the chest; not missing doses she says; taking 25 mg or 50 mg, taking 12 hours apart  Shortness of breath; with cardiac episodes for the most part; does not get enough air No hx of asthma or other lung disease  Fatigue; using CPAP for years, had the recent sleep study, not any better  Vit D and B12 deficiency; taking those, with calcium, everything that she's supposed to; good medicine taker, she tries; may miss some days if feeling bad  Headache; migraines with aura; 17 per months; lasting 29 hours, 26 attacks, 54 attack days in the last 90 days  Itching; every since cellulitis from the summer; she had itching patches all over her body; wonders if the fibro; can't see anything; tried coconut oil, nothing to see; no redness or scale  Husband has to help with IADLs, husband washes her hair, children have to get her things They are considering disability  Depression screen [facility] 2/9 11/20/2017 11/18/2017 10/14/2017 10/14/2017 08/12/2017  Decreased Interest 1 0 0 - 1  Down, Depressed, Hopeless 1 0 - 0 1  PHQ - 2  Score 2 0 0 0 2  Altered sleeping 3 - - - 3  Tired, decreased energy 3 - - - 3  Change in appetite 2 - - - 1  Feeling bad or failure about yourself  1 - - - 0  Trouble concentrating 0 - - - 0  Moving slowly or fidgety/restless 0 - - - 0  Suicidal thoughts 0 - - - 0  PHQ-9 Score 11 - - - 9  Difficult doing work/chores Not difficult at all - - - Somewhat difficult    Relevant past medical, surgical, family and social history reviewed Past Medical History:  Diagnosis Date  . Acute postoperative pain 04/07/2017  . Anxiety   . Bursitis   . Edema leg 05/02/2015  . Fibromyalgia   . GERD (gastroesophageal reflux disease)   . IBS (irritable bowel syndrome)   . Knee pain, bilateral 12/21/2008   Qualifier: Diagnosis of  By: Hassell Done FNP, Tori Milks    . Lumbar discitis   . Osteoarthritis   . Right hand pain 04/10/2015   [facility] has done nerve conduction studies and ruled out carpal tunnel.   . Sleep apnea   . SVT (supraventricular tachycardia) ([place])   . Vertigo   . Vitamin D  deficiency 05/01/2016   Past Surgical History:  Procedure Laterality Date  . ABLATION     Uterine  . CARDIAC CATHETERIZATION     with ablation  . COLONOSCOPY WITH PROPOFOL N/A 05/17/2015   Procedure: COLONOSCOPY WITH PROPOFOL;  Surgeon: Manya Silvas, MD;  Location: [facility] ENDOSCOPY;  Service: Endoscopy;  Laterality: N/A;  . ESOPHAGOGASTRODUODENOSCOPY N/A 05/17/2015   Procedure: ESOPHAGOGASTRODUODENOSCOPY (EGD);  Surgeon: Manya Silvas, MD;  Location: [facility] ENDOSCOPY;  Service: Endoscopy;  Laterality: N/A;  . KNEE ARTHROSCOPY    . spg     6/18  . TUBAL LIGATION  10/01/99   Family History  Problem Relation Age of Onset  . Depression Mother   . Hypertension Mother   . Cancer Mother        Skin  . Hyperlipidemia Mother   . Anxiety disorder Mother   . Alcohol abuse Father   . Depression Father   . Stroke Father   . Heart disease Father   . Hypertension Father   . Anxiety disorder Father   .  Depression Sister   . Hyperlipidemia Sister   . Diabetes Sister   . Hypertension Sister   . Polycystic ovary syndrome Sister   . Bipolar disorder Sister   . Anxiety disorder Sister   . Cancer Maternal Grandmother 31       Breast  . Thyroid disease Maternal Grandmother   . Arthritis Maternal Grandmother   . Hyperlipidemia Maternal Grandmother   . Depression Sister   . Hypertension Sister   . Anxiety disorder Sister   . Alzheimer's disease Unknown   . Aneurysm Maternal Grandfather   . Hypertension Maternal Grandfather   . Heart disease Maternal Grandfather   . Alzheimer's disease Paternal Grandmother   . Heart attack Paternal Grandfather   . Hypertension Paternal Grandfather   . COPD Paternal Grandfather   . Heart disease Paternal Grandfather   . Bladder Cancer Neg Hx   . Kidney cancer Neg Hx    Social History   Tobacco Use  . Smoking status: Former Smoker    Packs/day: 4.00    Years: 3.00    Pack years: 12.00    Types: Cigarettes    Last attempt to quit: 12/10/1992    Years since quitting: 24.9  . Smokeless tobacco: Never Used  Substance Use Topics  . Alcohol use: No    Alcohol/week: 0.0 oz  . Drug use: No    Interim medical history since last visit reviewed. Allergies and medications reviewed  Review of Systems Per HPI unless specifically indicated above     Objective:    BP 118/82   Pulse 82   Temp 98.1 F (36.7 C) (Oral)   Resp 16   Wt 216 lb 8 oz (98.2 kg)   LMP 10/20/2017 (Exact Date)   SpO2 97%   BMI 38.35 kg/m   Wt Readings from Last 3 Encounters:  11/20/17 216 lb 8 oz (98.2 kg)  11/18/17 212 lb (96.2 kg)  10/14/17 215 lb (97.5 kg)    Physical Exam  Constitutional: She appears well-developed and well-nourished.  obese  HENT:  Head: Atraumatic.  Mouth/Throat: Mucous membranes are normal.  Eyes: EOM are normal. No scleral icterus.  Neck: No JVD present.  Cardiovascular: Normal rate, regular rhythm and normal heart sounds. Exam reveals no  friction rub.  Pulmonary/Chest: Effort normal and breath sounds normal. She has no rales.  Abdominal: She exhibits no distension.  Musculoskeletal: She exhibits no edema.  Skin: No pallor.  Psychiatric: She has a normal mood and affect. Her behavior is normal. Her mood appears not anxious. She does not exhibit a depressed mood.  Good eye contact with examiner       Assessment & Plan:   Problem List Items Addressed This Visit      Cardiovascular and Mediastinum   Paroxysmal supraventricular tachycardia ([place])    Check [facility] of monitor, refer back to cardiology; check K+, Mg2+, Ca2+, TSH, free T4      Relevant Orders   Cardiac event monitor   ECHOCARDIOGRAM COMPLETE   Ambulatory referral to Cardiology     Other   Fatigue   Relevant Orders   Cortisol (Completed)   TSH + free T4 (Completed)   Hyperlipidemia (Chronic)   Relevant Orders   Lipid panel (Completed)   Vitamin D insufficiency    Check vit D      Relevant Orders   VITAMIN D 25 Hydroxy (Vit-D Deficiency, Fractures) (Completed)   Vitamin B12 deficiency    Check B12      Relevant Orders   B12 (Completed)    Other Visit Diagnoses    Chest pain, unspecified type    -  Primary   Relevant Orders   EKG 12-Lead (Completed)   ECHOCARDIOGRAM COMPLETE   Ambulatory referral to Cardiology   SOB (shortness of breath)       Relevant Orders   EKG 12-Lead (Completed)   ECHOCARDIOGRAM COMPLETE   COMPLETE METABOLIC PANEL WITH GFR (Completed)   CBC with Differential/Platelet (Completed)   Ambulatory referral to Cardiology       Follow up plan: Return in about 2 weeks (around 12/04/2017) for follow-up visit with Dr. Sanda Klein.  An after-visit summary was printed and given to the patient at [facility].  Please see the patient instructions which may contain other information and recommendations beyond what is mentioned above in the assessment and plan.  No orders of the defined types were placed in this  encounter.   Orders Placed This Encounter  Procedures  . Cortisol  . TSH + free T4  . COMPLETE METABOLIC PANEL WITH GFR  . CBC with Differential/Platelet  . VITAMIN D 25 Hydroxy (Vit-D Deficiency, Fractures)  . B12  . Lipid panel  . Ambulatory referral to Cardiology  . Cardiac event monitor  . EKG 12-Lead  . ECHOCARDIOGRAM COMPLETE    Cortisol, TSH free t4, mg, cmp, cbc, vit D, B12

## 2017-11-20 NOTE — Assessment & Plan Note (Signed)
Check B12 

## 2017-11-20 NOTE — Assessment & Plan Note (Signed)
Check [facility] of monitor, refer back to cardiology; check K+, Mg2+, Ca2+, TSH, free T4

## 2017-11-20 NOTE — Assessment & Plan Note (Signed)
Check vit D 

## 2017-11-25 LAB — COMPLETE METABOLIC PANEL WITH GFR
AG RATIO: 1.8 (calc) (ref 1.0–2.5)
ALT: 21 U/L (ref 6–29)
AST: 14 U/L (ref 10–35)
Albumin: 4.2 g/dL (ref 3.6–5.1)
Alkaline phosphatase (APISO): 69 U/L (ref 33–115)
BILIRUBIN TOTAL: 0.5 mg/dL (ref 0.2–1.2)
BUN: 13 mg/dL (ref 7–25)
CALCIUM: 9.8 mg/dL (ref 8.6–10.2)
CHLORIDE: 105 mmol/L (ref 98–110)
CO2: 27 mmol/L (ref 20–32)
Creat: 0.8 mg/dL (ref 0.50–1.10)
GFR, EST AFRICAN AMERICAN: 102 mL/min/{1.73_m2} (ref >=60)
GFR, EST NON AFRICAN AMERICAN: 88 mL/min/{1.73_m2} (ref >=60)
GLOBULIN: 2.4 g/dL (ref 1.9–3.7)
Glucose, Bld: 92 mg/dL (ref 65–139)
POTASSIUM: 4.2 mmol/L (ref 3.5–5.3)
Sodium: 141 mmol/L (ref 135–146)
Total Protein: 6.6 g/dL (ref 6.1–8.1)

## 2017-11-25 LAB — CBC WITH DIFFERENTIAL/PLATELET
BASOS ABS: 88 {cells}/uL (ref 0–200)
Basophils Relative: 0.9 %
EOS ABS: 88 {cells}/uL (ref 15–500)
EOS PCT: 0.9 %
HCT: 39.2 % (ref 35.0–45.0)
Hemoglobin: 13.9 g/dL (ref 11.7–15.5)
Lymphs Abs: 2352 cells/uL (ref 850–3900)
MCH: 32.6 pg (ref 27.0–33.0)
MCHC: 35.5 g/dL (ref 32.0–36.0)
MCV: 91.8 fL (ref 80.0–100.0)
MONOS PCT: 10 %
MPV: 10.2 fL (ref 7.5–12.5)
NEUTROS ABS: 6292 {cells}/uL (ref 1500–7800)
Neutrophils Relative %: 64.2 %
PLATELETS: 299 10*3/uL (ref 140–400)
RBC: 4.27 10*6/uL (ref 3.80–5.10)
RDW: 12.7 % (ref 11.0–15.0)
Total Lymphocyte: 24 %
WBC mixed population: 980 cells/uL — ABNORMAL HIGH (ref 200–950)
WBC: 9.8 10*3/uL (ref 3.8–10.8)

## 2017-11-25 LAB — LIPID PANEL
CHOLESTEROL: 263 mg/dL — AB (ref <200)
HDL: 44 mg/dL — ABNORMAL LOW (ref >50)
LDL Cholesterol (Calc): 182 mg/dL (calc) — ABNORMAL HIGH
Non-HDL Cholesterol (Calc): 219 mg/dL (calc) — ABNORMAL HIGH (ref <130)
Total CHOL/HDL Ratio: 6 (calc) — ABNORMAL HIGH (ref <5.0)
Triglycerides: 209 mg/dL — ABNORMAL HIGH (ref <150)

## 2017-11-25 LAB — TSH+FREE T4: TSH W/REFLEX TO FT4: 3 m[IU]/L

## 2017-11-25 LAB — CORTISOL: CORTISOL PLASMA: 3.6 ug/dL

## 2017-11-25 LAB — VITAMIN D 25 HYDROXY (VIT D DEFICIENCY, FRACTURES): VIT D 25 HYDROXY: 17 ng/mL — AB (ref 30–100)

## 2017-11-25 LAB — VITAMIN B12: VITAMIN B 12: 638 pg/mL (ref 200–1100)

## 2017-11-27 ENCOUNTER — Ambulatory Visit: Payer: Self-pay | Admitting: Pain Medicine

## 2017-11-27 NOTE — Progress Notes (Deleted)
Patient's Name: Dana Bradley  MRN: [id number]  Referring Provider: Arnetha Courser, MD  DOB: 09-19-1971  PCP: Arnetha Courser, MD  DOS: 11/27/2017  Note by: Gaspar Cola, MD  Service setting: Ambulatory outpatient  Specialty: Interventional Pain Management  Patient type: Established  Location: [facility] (AMB) Pain Management Facility  Visit type: Interventional Procedure   Primary Reason for Visit: Interventional Pain Management Treatment. CC: No chief complaint on file.  Procedure:       Anesthesia, Analgesia, Anxiolysis:  Type: Diagnostic Inter-Laminar Epidural Steroid Injection #1  Region: Lumbar Level: L4-5 Level. Laterality: Right Paramedial  Type: Local Anesthesia with Moderate (Conscious) Sedation Local Anesthetic: Lidocaine 1% Route: Intravenous (IV) IV Access: Secured Sedation: Meaningful verbal contact was maintained at all times during the procedure  Indication(s): Analgesia and Anxiety   Indications: 1. DDD (degenerative disc disease), lumbar   2. Chronic low back pain (Primary Source of Pain) (Bilateral) (R>L) (midline)   3. Chronic lower extremity cramps (Bilateral) (R>L)   4. 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 (year old), female patient, seen today for interventional treatment. She  has a past surgical history that includes Ablation; Tubal ligation (10/01/99); Cardiac catheterization; Knee arthroscopy; Colonoscopy with propofol (N/A, 05/17/2015); Esophagogastroduodenoscopy (N/A, 05/17/2015); and spg. Dana Bradley has a current medication list which includes the following prescription(s): acetaminophen, baclofen, calcium-vitamin d-vitamin k, clonazepam, vitamin b-12, diclofenac sodium, fremanezumab-vfrm, gemfibrozil, hydrocodone-acetaminophen, metoprolol tartrate, ondansetron, oxcarbazepine, pregabalin, and venlafaxine xr. Her primarily concern today is the No chief complaint on  file.  Initial Vital Signs:  Pulse Rate:   Temp:   Resp:   BP:   SpO2:    BMI: Estimated body mass index is 38.35 kg/m as calculated from the following:   Height as of 11/18/17: 5\' 3"  (1.6 m).   Weight as of 11/20/17: 216 lb 8 oz (98.2 kg).  Risk Assessment: Allergies: Reviewed. She is allergic to aspirin; cymbalta [duloxetine hcl]; demerol [meperidine]; depakote [divalproex sodium]; duloxetine; haloperidol; iodinated diagnostic agents; iodine; reglan [metoclopramide]; tramadol hcl; trazodone; compazine [prochlorperazine]; meloxicam; penicillins; tomato; shellfish allergy; shellfish-derived products; bacitracin-neomycin-polymyxin; cephalosporins; ibuprofen; latex; neosporin [neomycin-bacitracin zn-polymyx]; nsaids; sulfasalazine; and sulfonamide derivatives.  Allergy Precautions: No iodine containing solutions or radiological contrast used. Coagulopathies: Reviewed. None identified.  Blood-thinner therapy: None at this time Active Infection(s): Reviewed. None identified. Dana Bradley is afebrile  Site Confirmation: Dana Bradley was asked to confirm the procedure and laterality before marking the site Procedure checklist: Completed Consent: Before the procedure and under the influence of no sedative(s), amnesic(s), or anxiolytics, the patient was informed of the treatment options, risks and possible complications. To fulfill our ethical and legal obligations, as recommended by the American Medical Association's Code of Ethics, I have informed the patient of my clinical impression; the nature and purpose of the treatment or procedure; the risks, benefits, and possible complications of the intervention; the alternatives, including doing nothing; the risk(s) and benefit(s) of the alternative treatment(s) or procedure(s); and the risk(s) and benefit(s) of doing nothing. The patient was provided information about the general risks and possible complications associated with the procedure. These may include,  but are not limited to: failure to achieve desired goals, infection, bleeding, organ or nerve damage, allergic reactions, paralysis, and death. In addition, the patient was informed of those risks and complications associated to Spine-related procedures, such as failure to decrease pain; infection (i.e.: Meningitis, epidural or intraspinal abscess);  bleeding (i.e.: epidural hematoma, subarachnoid hemorrhage, or any other type of intraspinal or peri-dural bleeding); organ or nerve damage (i.e.: Any type of peripheral nerve, nerve root, or spinal cord injury) with subsequent damage to sensory, motor, and/or autonomic systems, resulting in permanent pain, numbness, and/or weakness of one or several areas of the body; allergic reactions; (i.e.: anaphylactic reaction); and/or death. Furthermore, the patient was informed of those risks and complications associated with the medications. These include, but are not limited to: allergic reactions (i.e.: anaphylactic or anaphylactoid reaction(s)); adrenal axis suppression; blood sugar elevation that in diabetics may result in ketoacidosis or comma; water retention that in patients with history of congestive heart failure may result in shortness of breath, pulmonary edema, and decompensation with resultant heart failure; weight gain; swelling or edema; medication-induced neural toxicity; particulate matter embolism and blood vessel occlusion with resultant organ, and/or nervous system infarction; and/or aseptic necrosis of one or more joints. Finally, the patient was informed that Medicine is not an exact science; therefore, there is also the possibility of unforeseen or unpredictable risks and/or possible complications that may result in a catastrophic outcome. The patient indicated having understood very clearly. We have given the patient no guarantees and we have made no promises. Enough time was given to the patient to ask questions, all of which were answered to the  patient's satisfaction. Dana Bradley has indicated that she wanted to continue with the procedure. Attestation: I, the ordering provider, attest that I have discussed with the patient the benefits, risks, side-effects, alternatives, likelihood of achieving goals, and potential problems during recovery for the procedure that I have provided informed consent. Date  Time: {CHL [facility]-PAIN TIME CHOICES:21018001}  Pre-Procedure Preparation:  Monitoring: As per clinic protocol. Respiration, ETCO2, SpO2, BP, heart rate and rhythm monitor placed and checked for adequate function Safety Precautions: Patient was assessed for positional comfort and pressure points before starting the procedure. Time-out: I initiated and conducted the "Time-out" before starting the procedure, as per protocol. The patient was asked to participate by confirming the accuracy of the "Time Out" information. Verification of the correct person, site, and procedure were performed and confirmed by me, the nursing staff, and the patient. "Time-out" conducted as per Joint Commission's Universal Protocol (UP.01.01.01). Time:    Description of Procedure:       Position: Prone with head of the table was raised to facilitate breathing. Target Area: The interlaminar space, initially targeting the lower laminar border of the superior vertebral body. Approach: Paramedial approach. Area Prepped: Entire Posterior Lumbar Region Prepping solution: ChloraPrep (2% chlorhexidine gluconate and 70% isopropyl alcohol) Safety Precautions: Aspiration looking for blood return was conducted prior to all injections. At no point did we inject any substances, as a needle was being advanced. No attempts were made at seeking any paresthesias. Safe injection practices and needle disposal techniques used. Medications properly checked for expiration dates. SDV (single dose vial) medications used. Description of the Procedure: Protocol guidelines were followed. The  procedure needle was introduced through the skin, ipsilateral to the reported pain, and advanced to the target area. Bone was contacted and the needle walked caudad, until the lamina was cleared. The epidural space was identified using "loss-of-resistance technique" with 2-3 ml of PF-NaCl (0.9% NSS), in a 5cc LOR glass syringe. There were no vitals filed for this visit.  Start Time:   hrs. End Time:   hrs. Materials:  Needle(s) Type: Epidural needle Gauge: 17G Length: 3.5-in Medication(s): Please see orders for medications and dosing details.  Imaging Guidance (Spinal):  Type of Imaging Technique: Fluoroscopy Guidance (Spinal) Indication(s): Assistance in needle guidance and placement for procedures requiring needle placement in or near specific anatomical locations not easily accessible without such assistance. Exposure Time: Please see nurses notes. Contrast: Before injecting any contrast, we confirmed that the patient did not have an allergy to iodine, shellfish, or radiological contrast. Once satisfactory needle placement was completed at the desired level, radiological contrast was injected. Contrast injected under live fluoroscopy. No contrast complications. See chart for type and volume of contrast used. Fluoroscopic Guidance: I was personally present during the use of fluoroscopy. "Tunnel Vision Technique" used to obtain the best possible view of the target area. Parallax error corrected before commencing the procedure. "Direction-depth-direction" technique used to introduce the needle under continuous pulsed fluoroscopy. Once target was reached, antero-posterior, oblique, and lateral fluoroscopic projection used confirm needle placement in all planes. Images permanently stored in EMR. Interpretation: I personally interpreted the imaging intraoperatively. Adequate needle placement confirmed in multiple planes. Appropriate spread of contrast into desired area was observed. No evidence of  afferent or efferent intravascular uptake. No intrathecal or subarachnoid spread observed. Permanent images saved into the patient's record.  Antibiotic Prophylaxis:   Anti-infectives (From admission, onward)   None     Indication(s): None identified  Post-operative Assessment:  Post-procedure Vital Signs:  Pulse Rate:   Temp:   Resp:   BP:   SpO2:    EBL: None  Complications: No immediate post-treatment complications observed by team, or reported by patient.  Note: The patient tolerated the entire procedure well. A repeat set of vitals were taken after the procedure and the patient was kept under observation following institutional policy, for this type of procedure. Post-procedural neurological assessment was performed, showing return to baseline, prior to discharge. The patient was provided with post-procedure discharge instructions, including a section on how to identify potential problems. Should any problems arise concerning this procedure, the patient was given instructions to immediately contact us, at any time, without hesitation. In any case, we plan to contact the patient by telephone for a follow-up status report regarding this interventional procedure.  Comments:  No additional relevant information.  Plan of Care   Imaging Orders  No imaging studies ordered today   Procedure Orders    No procedure(s) ordered today    Medications ordered for procedure: No orders of the defined types were placed in this encounter.  Medications administered: Cambrey Lupi had no medications administered during this visit.  See the medical record for exact dosing, route, and time of administration.  New Prescriptions   No medications on file   Disposition: Discharge home  Discharge Date & Time: 11/27/2017;   hrs.   Physician-requested Follow-up: No Follow-up on file.  Future Appointments  Date Time Provider [place]  11/27/2017  8:30 AM Milinda Pointer, MD [facility]  None  12/12/2017  4:00 PM Arnetha Courser, MD [facility]  12/25/2017 10:30 AM Milinda Pointer, MD [facility] None  01/05/2018  8:45 AM Vevelyn Francois, NP [facility] None   Primary Care Physician: Arnetha Courser, MD Location: [facility] Facility Note by: Gaspar Cola, MD Date: 11/27/2017; Time: 6:00 AM  Disclaimer:  Medicine is not an Chief Strategy Officer. The only guarantee in medicine is that nothing is guaranteed. It is important to note that the decision to proceed with this intervention was based on the information collected from the patient. The Data and conclusions were drawn from the patient's questionnaire,  the interview, and the physical examination. Because the information was provided in large part by the patient, it cannot be guaranteed that it has not been purposely or unconsciously manipulated. Every effort has been made to obtain as much relevant data as possible for this evaluation. It is important to note that the conclusions that lead to this procedure are derived in large part from the available data. Always take into account that the treatment will also be dependent on availability of resources and existing treatment guidelines, considered by other Pain Management Practitioners as being common knowledge and practice, at the time of the intervention. For Medico-Legal purposes, it is also important to point out that variation in procedural techniques and pharmacological choices are the acceptable norm. The indications, contraindications, technique, and results of the above procedure should only be interpreted and judged by a Board-Certified Interventional Pain Specialist with extensive familiarity and expertise in the same exact procedure and technique.

## 2017-12-02 ENCOUNTER — Ambulatory Visit: Payer: Self-pay | Admitting: Pain Medicine

## 2017-12-02 ENCOUNTER — Other Ambulatory Visit: Payer: Self-pay

## 2017-12-02 ENCOUNTER — Other Ambulatory Visit: Payer: Self-pay | Admitting: Family Medicine

## 2017-12-02 ENCOUNTER — Encounter: Payer: Self-pay | Admitting: Psychiatry

## 2017-12-02 ENCOUNTER — Ambulatory Visit (INDEPENDENT_AMBULATORY_CARE_PROVIDER_SITE_OTHER): Payer: Medicaid Other | Admitting: Psychiatry

## 2017-12-02 VITALS — BP 110/75 | HR 79 | Temp 97.8°F | Wt 214.6 lb

## 2017-12-02 DIAGNOSIS — F410 Panic disorder [episodic paroxysmal anxiety] without agoraphobia: Secondary | ICD-10-CM | POA: Diagnosis not present

## 2017-12-02 DIAGNOSIS — G4733 Obstructive sleep apnea (adult) (pediatric): Secondary | ICD-10-CM

## 2017-12-02 DIAGNOSIS — Z9989 Dependence on other enabling machines and devices: Secondary | ICD-10-CM | POA: Diagnosis not present

## 2017-12-02 DIAGNOSIS — F411 Generalized anxiety disorder: Secondary | ICD-10-CM | POA: Diagnosis not present

## 2017-12-02 DIAGNOSIS — F4000 Agoraphobia, unspecified: Secondary | ICD-10-CM | POA: Diagnosis not present

## 2017-12-02 DIAGNOSIS — F331 Major depressive disorder, recurrent, moderate: Secondary | ICD-10-CM | POA: Diagnosis not present

## 2017-12-02 MED ORDER — VENLAFAXINE HCL ER 75 MG PO CP24: 75.0000 mg | ORAL_CAPSULE | Freq: Every day | ORAL | 2 refills | Status: DC

## 2017-12-02 MED ORDER — VITAMIN D (ERGOCALCIFEROL) 1.25 MG (50000 UNIT) PO CAPS: 50000.0000 [IU] | ORAL_CAPSULE | ORAL | 1 refills | Status: AC

## 2017-12-02 MED ORDER — OXCARBAZEPINE 150 MG PO TABS: 150.0000 mg | ORAL_TABLET | Freq: Two times a day (BID) | ORAL | 2 refills | Status: DC

## 2017-12-02 NOTE — Progress Notes (Signed)
Vit D Rx weekly

## 2017-12-02 NOTE — Progress Notes (Signed)
[facility] OP Progress Note  12/02/2017 4:19 PM Dana Bradley  MRN:  [id number]  Chief Complaint: ' I am anxious ." Chief Complaint    Follow-up; Medication Refill     HPI: Dana Bradley is a 47 y old female who is unemployed, married, lives in [place], has a history of depression, mood lability, panic attacks, agoraphobia, presented to the clinic today for a follow-up visit.  Patient today reports that she ran out of all of her medications including the Effexor as well as the Trileptal mid February.  Patient reports she could not come in for an appointment at that time because there was a lot going on at home.  She reports she did not know that she could call in for a refill or not and hence has been noncompliant with her medications.  She reports while she took the medication she was doing better ,however since the past couple of weeks she has been feeling extremely anxious, agitated, frustrated and angry .  She reports she has not taken a lot of Klonopin after it was prescribed last visit.  She was changed from Xanax to Klonopin in an attempt to taper her off of it.  She reports she may have taken 4 tablets since the past few months.  She reports sleep is good.  She reports she did not have to use Lunesta and she was asked by another provider to stop taking it.  She reports she is okay without it.  She reports she continues to have several psychosocial stressors at home.  Has a son who has autism and is disabled.  Her daughter who is 31 years old currently is dealing with severe headaches .  She has a 47 year old who also has medical problems of seizures, fatigue, cannot hold a job, dropped out of school and so on.  Her husband is also having mental health problems.  She reports her own health problems and having to deal with her family's health issues as well as financial stressors is a lot to deal with.  Discussed psychotherapy with patient.  Patient however reports she may not be able to afford it.  She  however reports she will call her insurance and check with them and let writer know.  She continues to deal with medical problems of chronic fatigue, chronic pain, SVT, sleep apnea, fibromyalgia and so on.  Visit Diagnosis:    ICD-10-CM   1. MDD (major depressive disorder), recurrent episode, moderate (HCC) F33.1 venlafaxine XR (EFFEXOR XR) 75 MG 24 hr capsule    OXcarbazepine (TRILEPTAL) 150 MG tablet  2. GAD (generalized anxiety disorder) F41.1 venlafaxine XR (EFFEXOR XR) 75 MG 24 hr capsule  3. Panic disorder F41.0 venlafaxine XR (EFFEXOR XR) 75 MG 24 hr capsule  4. OSA on CPAP G47.33    Z99.89   5. 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  History of suicide attempt at the age of 22 when she overdosed on pills.  She reports she was admitted to [facility] for mental health problems in the past.  She reports past trials of Prozac, Zoloft, Depakote, Cymbalta, Xanax, Vistaril, gabapentin, lamotrigine, Lexapro, trazodone, Ambien.  Past Medical History:  Past Medical History:  Diagnosis Date  . Acute postoperative pain 04/07/2017  . Anxiety   . Bursitis   . Edema leg 05/02/2015  . Fibromyalgia   . GERD (gastroesophageal reflux disease)   . IBS (irritable bowel syndrome)   . Knee pain, bilateral 12/21/2008  Qualifier: Diagnosis of  By: Hassell Done FNP, Tori Milks    . Lumbar discitis   . Osteoarthritis   . Right hand pain 04/10/2015   [facility] has done nerve conduction studies and ruled out carpal tunnel.   . Sleep apnea   . SVT (supraventricular tachycardia) ([place])   . Vertigo   . Vitamin D deficiency 05/01/2016    Past Surgical History:  Procedure Laterality Date  . ABLATION     Uterine  . CARDIAC CATHETERIZATION     with ablation  . COLONOSCOPY WITH PROPOFOL N/A 05/17/2015   Procedure: COLONOSCOPY WITH PROPOFOL;  Surgeon: Manya Silvas, MD;  Location: [facility] ENDOSCOPY;  Service: Endoscopy;  Laterality: N/A;  .  ESOPHAGOGASTRODUODENOSCOPY N/A 05/17/2015   Procedure: ESOPHAGOGASTRODUODENOSCOPY (EGD);  Surgeon: Manya Silvas, MD;  Location: [facility] ENDOSCOPY;  Service: Endoscopy;  Laterality: N/A;  . KNEE ARTHROSCOPY    . spg     6/18  . TUBAL LIGATION  10/01/99    Family Psychiatric History:Mother -Depression, Father-mood problems,alcoholism, son-autism.  Family History:  Family History  Problem Relation Age of Onset  . Depression Mother   . Hypertension Mother   . Cancer Mother        Skin  . Hyperlipidemia Mother   . Anxiety disorder Mother   . Alcohol abuse Father   . Depression Father   . Stroke Father   . Heart disease Father   . Hypertension Father   . Anxiety disorder Father   . Depression Sister   . Hyperlipidemia Sister   . Diabetes Sister   . Hypertension Sister   . Polycystic ovary syndrome Sister   . Bipolar disorder Sister   . Anxiety disorder Sister   . Cancer Maternal Grandmother 25       Breast  . Thyroid disease Maternal Grandmother   . Arthritis Maternal Grandmother   . Hyperlipidemia Maternal Grandmother   . Depression Sister   . Hypertension Sister   . Anxiety disorder Sister   . Alzheimer's disease Unknown   . Aneurysm Maternal Grandfather   . Hypertension Maternal Grandfather   . Heart disease Maternal Grandfather   . Alzheimer's disease Paternal Grandmother   . Heart attack Paternal Grandfather   . Hypertension Paternal Grandfather   . COPD Paternal Grandfather   . Heart disease Paternal Grandfather   . Bladder Cancer Neg Hx   . Kidney cancer Neg Hx    Substance abuse history: Denies  Social History: Married, unemployed.  She lives in [place].  She has 3 children Social History   Socioeconomic History  . Marital status: Married    Spouse name: brian  . Number of children: 3  . Years of education: None  . Highest education level: Associate degree: academic program  Social Needs  . Financial resource strain: Very hard  . Food insecurity  - worry: Often true  . Food insecurity - inability: Often true  . Transportation needs - medical: Yes  . Transportation needs - non-medical: Yes  Occupational History    Comment: not able  Tobacco Use  . Smoking status: Former Smoker    Packs/day: 4.00    Years: 3.00    Pack years: 12.00    Types: Cigarettes    Last attempt to quit: 12/10/1992    Years since quitting: 24.9  . Smokeless tobacco: Never Used  Substance and Sexual Activity  . Alcohol use: No    Alcohol/week: 0.0 oz  . Drug use: No  . Sexual activity: Not Currently  Partners: Male  Other Topics Concern  . None  Social History Narrative  . None    Allergies:  Allergies  Allergen Reactions  . Aspirin Swelling  . Cymbalta [Duloxetine Hcl] Other (See Comments)    Suicidal ideations and has homicidal thoughts per patient  . Demerol [Meperidine] Nausea And Vomiting    Patient projectile vomits and usually result in ER  . Depakote [Divalproex Sodium] Shortness Of Breath    W/ n/v  . Duloxetine Other (See Comments)    Suicidal idealation  . Haloperidol Shortness Of Breath    W/ n/v  . Iodinated Diagnostic Agents Other (See Comments)  . Iodine Shortness Of Breath  . Reglan [Metoclopramide] Shortness Of Breath    Can't breath, wheezes  . Tramadol Hcl Palpitations    Severely and adversely affects her SVT giving her tachycardias of 150-160 bpm.  . Trazodone Shortness Of Breath  . Compazine [Prochlorperazine] Other (See Comments)    Panic attack  . Meloxicam Other (See Comments)    mouth sores, tingling, blisters in mouth  . Penicillins Rash  . Tomato Hives    Tongue will blister  . Shellfish Allergy     Other reaction(s): Unknown  . Shellfish-Derived Products Other (See Comments)  . Bacitracin-Neomycin-Polymyxin Rash  . Cephalosporins Rash    rash  . Ibuprofen Other (See Comments) and Rash    Blisters in mouth. Blisters in mouth.  . Latex Itching  . Neosporin [Neomycin-Bacitracin Zn-Polymyx] Rash  .  Nsaids Other (See Comments)    Blisters in mouth; can take 1 ibuprofen 2x a month  . Sulfasalazine Rash  . Sulfonamide Derivatives Rash    Metabolic Disorder Labs: No results found for: HGBA1C, MPG No results found for: PROLACTIN Lab Results  Component Value Date   CHOL 263 (H) 11/24/2017   TRIG 209 (H) 11/24/2017   HDL 44 (L) 11/24/2017   CHOLHDL 6.0 (H) 11/24/2017   VLDL 53 (H) 05/02/2017   LDLCALC 164 (H) 05/02/2017   LDLCALC NOT CALC 11/27/2016   Lab Results  Component Value Date   TSH 2.63 05/02/2017   TSH 1.95 04/29/2016    Therapeutic Level Labs: No results found for: LITHIUM No results found for: VALPROATE No components found for:  CBMZ  Current Medications: Current Outpatient Medications  Medication Sig Dispense Refill  . acetaminophen (TYLENOL) 325 MG tablet Take 650 mg by mouth as needed.     . Calcium-Vitamin D-Vitamin K 387-564-33 MG-UNT-MCG TABS Take 1 tablet by mouth daily.     . clonazePAM (KLONOPIN) 0.5 MG tablet Take 1 tablet (0.5 mg total) by mouth 2 (two) times daily as needed for anxiety (only for severe panic sx). 60 tablet 0  . Cyanocobalamin (VITAMIN B-12) 500 MCG SUBL Place 3 tablets (1,500 mcg total) under the tongue daily. 150 tablet   . diclofenac sodium (VOLTAREN) 1 % GEL Apply 2 g topically 4 (four) times daily. (Patient taking differently: Apply 2 g topically as needed. ) 1 Tube 2  . Fremanezumab-vfrm (AJOVY) 225 MG/1.5ML SOSY Inject into the skin.    Marland Kitchen gemfibrozil (LOPID) 600 MG tablet Take 1 tablet (600 mg total) 2 (two) times daily before a meal by mouth. STOP atorvastatin and omega-3 products) 60 tablet 3  . metoprolol tartrate (LOPRESSOR) 50 MG tablet TAKE 1 TABLET BY MOUTH TWICE A DAY 180 tablet 3  . ondansetron (ZOFRAN) 4 MG tablet Take 1 tablet (4 mg total) every 8 (eight) hours as needed by mouth. 20 tablet 2  .  OXcarbazepine (TRILEPTAL) 150 MG tablet Take 1 tablet (150 mg total) by mouth 2 (two) times daily. 60 tablet 2  . pregabalin  (LYRICA) 150 MG capsule Take 1 capsule (150 mg total) by mouth every 8 (eight) hours. 90 capsule 2  . Vitamin D, Ergocalciferol, (DRISDOL) 50000 units CAPS capsule Take 1 capsule (50,000 Units total) by mouth every 7 (seven) days. 4 capsule 1  . baclofen (LIORESAL) 10 MG tablet Take 1 tablet (10 mg total) 3 (three) times daily by mouth. 90 tablet 2  . HYDROcodone-acetaminophen (NORCO) 7.5-325 MG tablet Take 1 tablet every 6 (six) hours as needed by mouth for moderate pain. 30 tablet 0  . venlafaxine XR (EFFEXOR XR) 75 MG 24 hr capsule Take 1 capsule (75 mg total) by mouth daily with breakfast. 30 capsule 2   No current facility-administered medications for this visit.      Musculoskeletal: Strength & Muscle Tone: within normal limits Gait & Station: normal Patient leans: N/A  Psychiatric Specialty Exam: Review of Systems  Psychiatric/Behavioral: Positive for depression. The patient is nervous/anxious.   All other systems reviewed and are negative.   Blood pressure 110/75, pulse 79, temperature 97.8 F (36.6 C), temperature source Oral, weight 214 lb 9.6 oz (97.3 kg).Body mass index is 38.01 kg/m.  General Appearance: Casual  Eye Contact:  Fair  Speech:  Normal Rate  Volume:  Normal  Mood:  Anxious, Dysphoric and Irritable  Affect:  Congruent  Thought Process:  Goal Directed and Descriptions of Associations: Intact  Orientation:  Full (Time, Place, and Person)  Thought Content: Logical   Suicidal Thoughts:  No  Homicidal Thoughts:  No  Memory:  Immediate;   Fair Recent;   Fair Remote;   Fair  Judgement:  Fair  Insight:  Fair  Psychomotor Activity:  Normal  Concentration:  Concentration: Fair and Attention Span: Fair  Recall:  [employer] of Knowledge: Fair  Language: Fair  Akathisia:  No  Handed:  Right  AIMS (if indicated): NA  Assets:  Communication Skills Desire for Improvement Housing Social Support  ADL's:  Intact  Cognition: WNL  Sleep:  Fair    Screenings: PHQ2-9     Office Visit from 11/20/2017 in [facility] Procedure visit from 11/18/2017 in [place] Clinical Support from 10/14/2017 in [facility] Office Visit from 08/12/2017 in [facility] Office Visit from 08/11/2017 in [place]  PHQ-2 Total Score  2  0  0  2  0  PHQ-9 Total Score  11  No data  No data  9  No data       Assessment and Plan: Dana Bradley is a 47 year old Caucasian female who has multiple mental health issues as well as medical problems who presented to the clinic today for a follow-up visit.  She also has multiple psychosocial stressors including her own medical issues as well as medical problems in her family-her 3 children has their own health issues.  She also has financial problems.  She has a history of extreme panic sx/agoraphobia which limits her ability to function including her ability to leave her house on a regular basis.  She was prescribed Klonopin, changed from Xanax last visit to help her cope with her agoraphobia so that she can take her children for their appointments.  She currently denies any suicidality or perceptual disturbances.  She also denies abusing drugs or alcohol.  She however was noncompliant with her medication because she could not come in for an appointment and did not call the clinic for a refill.  Discussed plan with patient.  Plan as noted below.  Plan Depression Increase Effexor XR to 75 mg p.o. daily Continue Trileptal 150 mg p.o. twice daily Discussed referral for CBT however she is not sure if she can afford.  For anxiety symptoms Effexor XR 75 mg p.o. daily Continue Klonopin 0.5 mg p.o. twice daily as needed for panic symptoms.  Patient was on Xanax when she came to establish care at the clinic here.  She was changed to Klonopin at that time for severe panic  attacks as well as agoraphobia since she was having difficulty leaving her house to take her kids to their appointments.  She reports she has taken only 4 pills in the past few months.  Problem continue to provide medication education as well as discuss benzodiazepine risk.  Insomnia OSA, on CPAP. Discontinue Lunesta, she is not compliant. Patient reports sleep is improved.  For agoraphobia Discussed referral for CBT.  Provided medication education, discussed the importance of staying compliant with her medications as prescribed.  Follow-up in clinic in 6-8 weeks or sooner if needed.  More than 50 % of the time was spent for psychoeducation and supportive psychotherapy and care coordination.  This note was generated in part or whole with voice recognition software. Voice recognition is usually quite accurate but there are transcription errors that can and very often do occur. I apologize for any typographical errors that were not detected and corrected.        Ursula Alert, MD 12/03/2017, 12:05 PM

## 2017-12-03 ENCOUNTER — Telehealth: Payer: Self-pay

## 2017-12-03 ENCOUNTER — Encounter: Payer: Self-pay | Admitting: Psychiatry

## 2017-12-03 NOTE — Telephone Encounter (Signed)
-----   Message from Arnetha Courser, MD sent at 12/02/2017  1:30 PM EST ----- Please let pt know that her cortisol is normal; her thyroid tests are normal; her vitamin D is low, so let's have her take prescription strength vit D every week for a while; Rx sent; cholesterol is much too high; is she taking the gemfibrozil? Can she take a statin?

## 2017-12-03 NOTE — Telephone Encounter (Signed)
Called pt. No answer. LM for pt asking her to call back and state whether or not she has been taking her cholesterol medication. CRM created. Labs routed to [facility].

## 2017-12-04 ENCOUNTER — Telehealth: Payer: Self-pay

## 2017-12-04 ENCOUNTER — Other Ambulatory Visit: Payer: Self-pay | Admitting: Family Medicine

## 2017-12-04 DIAGNOSIS — E782 Mixed hyperlipidemia: Secondary | ICD-10-CM

## 2017-12-04 DIAGNOSIS — K219 Gastro-esophageal reflux disease without esophagitis: Secondary | ICD-10-CM

## 2017-12-04 MED ORDER — ROSUVASTATIN CALCIUM 10 MG PO TABS: 10.0000 mg | ORAL_TABLET | Freq: Every day | ORAL | 3 refills | Status: DC

## 2017-12-04 MED ORDER — OMEPRAZOLE 20 MG PO CPDR: 20.0000 mg | DELAYED_RELEASE_CAPSULE | Freq: Every day | ORAL | 0 refills | Status: DC

## 2017-12-04 NOTE — Telephone Encounter (Signed)
Pt requesting rx for omeprazole due to GERD sx. Pt dx w/ GERD in 2014 by Dr.Pratt. I do not see where pt has been prescribed this medication in the past by our office. Please advise.

## 2017-12-04 NOTE — Telephone Encounter (Signed)
I returned call to patient I asked about cholesterol in the family; father was a raging alcoholic, ate anything Her father had series of strokes so maybe cholesterol issues She is eating more fried foods; not using butter Talked to patient about obesity, weight loss journey She has been on topamax that didn't help with weight loss (used for migraines) Encouraged her to move more; even just five minutes of walking at a time and that adding up over the day will add up

## 2017-12-04 NOTE — Telephone Encounter (Signed)
Approved one month

## 2017-12-04 NOTE — Telephone Encounter (Signed)
-----   Message from Arnetha Courser, MD sent at 12/04/2017  9:49 AM EST ----- STOP the gemfibrozil and start new statin and vascepa; recheck lipids in 6-8 weeks

## 2017-12-04 NOTE — Telephone Encounter (Signed)
Patient is calling to request a 1 month Rx of Omeprazole for reflux. She forgot to ask at her appointment and she is having symptoms. She states she will be glad to discuss at her next appointment.

## 2017-12-04 NOTE — Telephone Encounter (Signed)
Called pt informed her of this message as well as following message regarding Vascepa. Pt states that she is confused. She states that she was on Atorvastatin ad Vascepa in the past and her Cholesterol was getting better, then she states that she was switched to gemfibrozil pt states that is not understanding why her medication is continuing to be switched. Pt states that she just wants to understand what is going on. Pt also notes that she is working on diet, eating very little from pigs or cows, no butter or salt. Pt has appt scheduled on 12/12/17. Please advise.

## 2017-12-04 NOTE — Progress Notes (Signed)
Start crestor; she cannot take vascepa; orders entered for 6 weeks I put in lab results to start vascepa -- she cannot take that Really encourage her to eat right and lose weight

## 2017-12-05 ENCOUNTER — Other Ambulatory Visit: Payer: Self-pay

## 2017-12-05 DIAGNOSIS — R0602 Shortness of breath: Secondary | ICD-10-CM

## 2017-12-05 DIAGNOSIS — R079 Chest pain, unspecified: Secondary | ICD-10-CM

## 2017-12-05 DIAGNOSIS — I471 Supraventricular tachycardia: Secondary | ICD-10-CM

## 2017-12-12 ENCOUNTER — Encounter: Payer: Self-pay | Admitting: Family Medicine

## 2017-12-12 ENCOUNTER — Ambulatory Visit (INDEPENDENT_AMBULATORY_CARE_PROVIDER_SITE_OTHER): Payer: Medicaid Other | Admitting: Family Medicine

## 2017-12-12 ENCOUNTER — Ambulatory Visit
Admission: RE | Admit: 2017-12-12 | Discharge: 2017-12-12 | Disposition: A | Discharge status: Home / Self Care | Payer: Medicaid Other | Source: Ambulatory Visit | Attending: Family Medicine | Admitting: Family Medicine

## 2017-12-12 VITALS — BP 128/64 | HR 92 | Temp 98.3°F | Wt 218.7 lb

## 2017-12-12 DIAGNOSIS — I471 Supraventricular tachycardia: Secondary | ICD-10-CM

## 2017-12-12 DIAGNOSIS — R5382 Chronic fatigue, unspecified: Secondary | ICD-10-CM | POA: Diagnosis not present

## 2017-12-12 DIAGNOSIS — R232 Flushing: Secondary | ICD-10-CM

## 2017-12-12 DIAGNOSIS — M791 Myalgia, unspecified site: Secondary | ICD-10-CM

## 2017-12-12 DIAGNOSIS — R0602 Shortness of breath: Secondary | ICD-10-CM | POA: Diagnosis not present

## 2017-12-12 DIAGNOSIS — R079 Chest pain, unspecified: Secondary | ICD-10-CM

## 2017-12-12 DIAGNOSIS — R7989 Other specified abnormal findings of blood chemistry: Secondary | ICD-10-CM | POA: Insufficient documentation

## 2017-12-12 DIAGNOSIS — G43001 Migraine without aura, not intractable, with status migrainosus: Secondary | ICD-10-CM

## 2017-12-12 DIAGNOSIS — M797 Fibromyalgia: Secondary | ICD-10-CM

## 2017-12-12 DIAGNOSIS — E2740 Unspecified adrenocortical insufficiency: Secondary | ICD-10-CM | POA: Diagnosis not present

## 2017-12-12 DIAGNOSIS — R21 Rash and other nonspecific skin eruption: Secondary | ICD-10-CM

## 2017-12-12 DIAGNOSIS — G9332 Myalgic encephalomyelitis/chronic fatigue syndrome: Secondary | ICD-10-CM | POA: Insufficient documentation

## 2017-12-12 DIAGNOSIS — I272 Pulmonary hypertension, unspecified: Secondary | ICD-10-CM | POA: Insufficient documentation

## 2017-12-12 DIAGNOSIS — I071 Rheumatic tricuspid insufficiency: Secondary | ICD-10-CM | POA: Insufficient documentation

## 2017-12-12 HISTORY — DX: Chronic fatigue, unspecified: R53.82

## 2017-12-12 NOTE — Patient Instructions (Signed)
Return on Monday or Tuesday for early morning labs We'll have you see Dr. Rockey Situ Call me with any new symptoms or changes

## 2017-12-12 NOTE — Assessment & Plan Note (Signed)
Managed by Dr. Manuella Ghazi; significantly affecting her quality of life

## 2017-12-12 NOTE — Assessment & Plan Note (Signed)
Recheck slightly low serum cortisol; if remaining low, refer to endo

## 2017-12-12 NOTE — Assessment & Plan Note (Signed)
Will refer to cardiologist

## 2017-12-12 NOTE — Assessment & Plan Note (Addendum)
Refer to cardiologist; reviewed the echo report with her and her husband

## 2017-12-12 NOTE — Progress Notes (Signed)
*  PRELIMINARY RESULTS* Echocardiogram 2D Echocardiogram has been performed.  Dana Bradley 12/12/2017, 11:52 AM
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
## 2017-12-15 ENCOUNTER — Other Ambulatory Visit: Payer: Self-pay

## 2017-12-15 DIAGNOSIS — M797 Fibromyalgia: Secondary | ICD-10-CM

## 2017-12-15 DIAGNOSIS — E782 Mixed hyperlipidemia: Secondary | ICD-10-CM

## 2017-12-15 DIAGNOSIS — M791 Myalgia, unspecified site: Secondary | ICD-10-CM

## 2017-12-15 DIAGNOSIS — R21 Rash and other nonspecific skin eruption: Secondary | ICD-10-CM

## 2017-12-15 DIAGNOSIS — G9332 Myalgic encephalomyelitis/chronic fatigue syndrome: Secondary | ICD-10-CM

## 2017-12-15 DIAGNOSIS — R232 Flushing: Secondary | ICD-10-CM

## 2017-12-15 DIAGNOSIS — R7989 Other specified abnormal findings of blood chemistry: Secondary | ICD-10-CM

## 2017-12-15 DIAGNOSIS — R5382 Chronic fatigue, unspecified: Secondary | ICD-10-CM

## 2017-12-15 DIAGNOSIS — E2740 Unspecified adrenocortical insufficiency: Secondary | ICD-10-CM

## 2017-12-16 ENCOUNTER — Ambulatory Visit
Admission: RE | Admit: 2017-12-16 | Discharge: 2017-12-16 | Disposition: A | Discharge status: Home / Self Care | Payer: Medicaid Other | Source: Ambulatory Visit | Attending: Pain Medicine | Admitting: Pain Medicine

## 2017-12-16 ENCOUNTER — Other Ambulatory Visit: Payer: Self-pay

## 2017-12-16 ENCOUNTER — Encounter: Payer: Self-pay | Admitting: Pain Medicine

## 2017-12-16 ENCOUNTER — Ambulatory Visit (HOSPITAL_BASED_OUTPATIENT_CLINIC_OR_DEPARTMENT_OTHER): Payer: Medicaid Other | Admitting: Pain Medicine

## 2017-12-16 VITALS — BP 107/58 | HR 68 | Temp 98.2°F | Resp 18 | Ht 63.0 in | Wt 218.0 lb

## 2017-12-16 DIAGNOSIS — M47816 Spondylosis without myelopathy or radiculopathy, lumbar region: Secondary | ICD-10-CM

## 2017-12-16 DIAGNOSIS — M47896 Other spondylosis, lumbar region: Secondary | ICD-10-CM | POA: Insufficient documentation

## 2017-12-16 DIAGNOSIS — G8929 Other chronic pain: Secondary | ICD-10-CM | POA: Diagnosis not present

## 2017-12-16 DIAGNOSIS — M5126 Other intervertebral disc displacement, lumbar region: Secondary | ICD-10-CM | POA: Diagnosis not present

## 2017-12-16 DIAGNOSIS — R252 Cramp and spasm: Secondary | ICD-10-CM

## 2017-12-16 DIAGNOSIS — Z886 Allergy status to analgesic agent status: Secondary | ICD-10-CM | POA: Insufficient documentation

## 2017-12-16 DIAGNOSIS — Z91013 Allergy to seafood: Secondary | ICD-10-CM | POA: Diagnosis not present

## 2017-12-16 DIAGNOSIS — Z888 Allergy status to other drugs, medicaments and biological substances status: Secondary | ICD-10-CM | POA: Diagnosis not present

## 2017-12-16 DIAGNOSIS — Z880 Allergy status to penicillin: Secondary | ICD-10-CM | POA: Diagnosis not present

## 2017-12-16 DIAGNOSIS — Z882 Allergy status to sulfonamides status: Secondary | ICD-10-CM | POA: Diagnosis not present

## 2017-12-16 DIAGNOSIS — M5442 Lumbago with sciatica, left side: Secondary | ICD-10-CM

## 2017-12-16 DIAGNOSIS — M5136 Other intervertebral disc degeneration, lumbar region: Secondary | ICD-10-CM | POA: Insufficient documentation

## 2017-12-16 DIAGNOSIS — Z79899 Other long term (current) drug therapy: Secondary | ICD-10-CM | POA: Insufficient documentation

## 2017-12-16 DIAGNOSIS — M5137 Other intervertebral disc degeneration, lumbosacral region: Secondary | ICD-10-CM

## 2017-12-16 DIAGNOSIS — Z885 Allergy status to narcotic agent status: Secondary | ICD-10-CM | POA: Insufficient documentation

## 2017-12-16 DIAGNOSIS — Z881 Allergy status to other antibiotic agents status: Secondary | ICD-10-CM | POA: Insufficient documentation

## 2017-12-16 DIAGNOSIS — R55 Syncope and collapse: Secondary | ICD-10-CM

## 2017-12-16 DIAGNOSIS — G894 Chronic pain syndrome: Secondary | ICD-10-CM

## 2017-12-16 DIAGNOSIS — M5441 Lumbago with sciatica, right side: Secondary | ICD-10-CM | POA: Diagnosis present

## 2017-12-16 MED ORDER — MIDAZOLAM HCL 5 MG/5ML IJ SOLN
1.0000 mg | INTRAMUSCULAR | Status: DC | PRN
  Administered 2017-12-16: 2 mg via INTRAVENOUS
  Filled 2017-12-16: qty 5

## 2017-12-16 MED ORDER — ROPIVACAINE HCL 2 MG/ML IJ SOLN
2.0000 mL | Freq: Once | INTRAMUSCULAR | Status: DC
  Administered 2017-12-16: 2 mL via EPIDURAL
  Filled 2017-12-16: qty 10

## 2017-12-16 MED ORDER — LACTATED RINGERS IV SOLN
1000.0000 mL | Freq: Once | INTRAVENOUS | Status: DC
  Administered 2017-12-16: 1000 mL via INTRAVENOUS

## 2017-12-16 MED ORDER — GLYCOPYRROLATE 0.2 MG/ML IJ SOLN
INTRAMUSCULAR | Status: AC
  Filled 2017-12-16: qty 1

## 2017-12-16 MED ORDER — IOPAMIDOL (ISOVUE-M 200) INJECTION 41%
10.0000 mL | Freq: Once | INTRAMUSCULAR | Status: DC
  Administered 2017-12-16: 10 mL via EPIDURAL
  Filled 2017-12-16: qty 10

## 2017-12-16 MED ORDER — LIDOCAINE HCL 2 % IJ SOLN
20.0000 mL | Freq: Once | INTRAMUSCULAR | Status: DC
  Administered 2017-12-16: 400 mg
  Filled 2017-12-16: qty 20

## 2017-12-16 MED ORDER — SODIUM CHLORIDE 0.9% FLUSH
2.0000 mL | Freq: Once | INTRAVENOUS | Status: DC
  Administered 2017-12-16: 2 mL

## 2017-12-16 MED ORDER — TRIAMCINOLONE ACETONIDE 40 MG/ML IJ SUSP
40.0000 mg | Freq: Once | INTRAMUSCULAR | Status: DC
  Administered 2017-12-16: 40 mg
  Filled 2017-12-16: qty 1

## 2017-12-16 MED ORDER — FENTANYL CITRATE (PF) 100 MCG/2ML IJ SOLN
25.0000 ug | INTRAMUSCULAR | Status: DC | PRN
  Administered 2017-12-16: 50 ug via INTRAVENOUS
  Filled 2017-12-16: qty 2

## 2017-12-16 MED ORDER — GLYCOPYRROLATE 0.2 MG/ML IJ SOLN
0.2000 mg | Freq: Once | INTRAMUSCULAR | Status: DC
  Administered 2017-12-16: 0.2 mg via INTRAVENOUS

## 2017-12-16 MED ORDER — SODIUM CHLORIDE 0.9 % IJ SOLN
INTRAMUSCULAR | Status: AC
  Filled 2017-12-16: qty 10

## 2017-12-16 MED ORDER — HYDROCODONE-ACETAMINOPHEN 7.5-325 MG PO TABS: 1.0000 | ORAL_TABLET | Freq: Four times a day (QID) | ORAL | 0 refills | Status: DC | PRN

## 2017-12-16 NOTE — Progress Notes (Signed)
Nursing Pain Medication Assessment:  Safety precautions to be maintained throughout the outpatient stay will include: orient to surroundings, keep bed in low position, maintain call bell within reach at all times, provide assistance with transfer out of bed and ambulation.  Medication Inspection Compliance: Pill count conducted under aseptic conditions, in front of the patient. Neither the pills nor the bottle was removed from the patient's sight at any time. Once count was completed pills were immediately returned to the patient in their original bottle.  Medication: Hydrocodone/APAP Pill/Patch Count: 0 of 30 pills remain Pill/Patch Appearance: Markings consistent with prescribed medication Bottle Appearance: Standard pharmacy container. Clearly labeled. Filled Date: 11/12 / 2019 Last Medication intake:  Today

## 2017-12-16 NOTE — Progress Notes (Signed)
Patient's Name: Dana Bradley  MRN: [id number]  Referring Provider: Milinda Pointer, MD  DOB: Oct 09, 1970  PCP: Arnetha Courser, MD  DOS: 12/16/2017  Note by: Gaspar Cola, MD  Service setting: Ambulatory outpatient  Specialty: Interventional Pain Management  Patient type: Established  Location: [facility] (AMB) Pain Management Facility  Visit type: Interventional Procedure   Primary Reason for Visit: Interventional Pain Management Treatment. CC: Back Pain (low back pain down both legs, worse on the right.)  Procedure:       Anesthesia, Analgesia, Anxiolysis:  Type: Therapeutic Inter-Laminar Epidural Steroid Injection #2  Region: Lumbar Level: L1-2 Level. Laterality: Right-Sided Paramedial.  The patient indicates that the pain seems to move from right to left and today it seems to be worse on the right side.  Type: Moderate (Conscious) Sedation combined with Local Anesthesia Indication(s): Analgesia and Anxiety Route: Intravenous (IV) IV Access: Secured Sedation: Meaningful verbal contact was maintained at all times during the procedure  Local Anesthetic: Lidocaine 1-2%   Indications: 1. DDD (degenerative disc disease), lumbar   2. Chronic low back pain (Primary Source of Pain) (Bilateral) (R>L) (midline)   3. 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 Lumbar spondylosis    Pain Score: Pre-procedure: 2 /10 Post-procedure: 1 /10  Pre-op Assessment:  Dana Bradley is a 47 y.o. (year old), female patient, seen today for interventional treatment. She  has a past surgical history that includes Ablation; Tubal ligation (10/01/99); Cardiac catheterization; Knee arthroscopy; Colonoscopy with propofol (N/A, 05/17/2015); Esophagogastroduodenoscopy (N/A, 05/17/2015); and spg. Dana Bradley has a current medication list which includes the following prescription(s): acetaminophen, calcium-vitamin d-vitamin k, clonazepam, vitamin b-12, diclofenac sodium, fremanezumab-vfrm, metoprolol tartrate, omeprazole,  ondansetron, oxcarbazepine, pregabalin, rosuvastatin, venlafaxine xr, vitamin d (ergocalciferol), baclofen, and hydrocodone-acetaminophen, and the following Facility-Administered Medications: fentanyl, glycopyrrolate, iopamidol, lactated ringers, lidocaine, midazolam, ropivacaine (pf) 2 mg/ml (0.2%), sodium chloride flush, and triamcinolone acetonide. Her primarily concern today is the Back Pain (low back pain down both legs, worse on the right.)  Initial Vital Signs:  Pulse Rate: 68 Temp: 98.3 F (36.8 C) Resp: 16 BP: 112/62 SpO2: 97 %  BMI: Estimated body mass index is 38.62 kg/m as calculated from the following:   Height as of this encounter: 5\' 3"  (1.6 m).   Weight as of this encounter: 218 lb (98.9 kg).  Risk Assessment: Allergies: Reviewed. She is allergic to aspirin; cymbalta [duloxetine hcl]; demerol [meperidine]; depakote [divalproex sodium]; duloxetine; haloperidol; iodinated diagnostic agents; iodine; reglan [metoclopramide]; tramadol hcl; trazodone; compazine [prochlorperazine]; meloxicam; penicillins; tomato; shellfish allergy; shellfish-derived products; bacitracin-neomycin-polymyxin; cephalosporins; ibuprofen; latex; neosporin [neomycin-bacitracin zn-polymyx]; nsaids; sulfa antibiotics; sulfasalazine; and sulfonamide derivatives.  Allergy Precautions: No iodine containing solutions or radiological contrast used. Coagulopathies: Reviewed. None identified.  Blood-thinner therapy: None at this time Active Infection(s): Reviewed. None identified. Dana Bradley is afebrile  Site Confirmation: Dana Bradley was asked to confirm the procedure and laterality before marking the site Procedure checklist: Completed Consent: Before the procedure and under the influence of no sedative(s), amnesic(s), or anxiolytics, the patient was informed of the treatment options, risks and possible complications. To fulfill our ethical and legal obligations, as recommended by the American Medical Association's  Code of Ethics, I have informed the patient of my clinical impression; the nature and purpose of the treatment or procedure; the risks, benefits, and possible complications of the intervention; the alternatives, including doing nothing; the risk(s) and benefit(s) of the alternative treatment(s) or procedure(s); and the risk(s) and benefit(s) of doing nothing. The patient was provided information  about the general risks and possible complications associated with the procedure. These may include, but are not limited to: failure to achieve desired goals, infection, bleeding, organ or nerve damage, allergic reactions, paralysis, and death. In addition, the patient was informed of those risks and complications associated to Spine-related procedures, such as failure to decrease pain; infection (i.e.: Meningitis, epidural or intraspinal abscess); bleeding (i.e.: epidural hematoma, subarachnoid hemorrhage, or any other type of intraspinal or peri-dural bleeding); organ or nerve damage (i.e.: Any type of peripheral nerve, nerve root, or spinal cord injury) with subsequent damage to sensory, motor, and/or autonomic systems, resulting in permanent pain, numbness, and/or weakness of one or several areas of the body; allergic reactions; (i.e.: anaphylactic reaction); and/or death. Furthermore, the patient was informed of those risks and complications associated with the medications. These include, but are not limited to: allergic reactions (i.e.: anaphylactic or anaphylactoid reaction(s)); adrenal axis suppression; blood sugar elevation that in diabetics may result in ketoacidosis or comma; water retention that in patients with history of congestive heart failure may result in shortness of breath, pulmonary edema, and decompensation with resultant heart failure; weight gain; swelling or edema; medication-induced neural toxicity; particulate matter embolism and blood vessel occlusion with resultant organ, and/or nervous system  infarction; and/or aseptic necrosis of one or more joints. Finally, the patient was informed that Medicine is not an exact science; therefore, there is also the possibility of unforeseen or unpredictable risks and/or possible complications that may result in a catastrophic outcome. The patient indicated having understood very clearly. We have given the patient no guarantees and we have made no promises. Enough time was given to the patient to ask questions, all of which were answered to the patient's satisfaction. Ms. Quiros has indicated that she wanted to continue with the procedure. Attestation: I, the ordering provider, attest that I have discussed with the patient the benefits, risks, side-effects, alternatives, likelihood of achieving goals, and potential problems during recovery for the procedure that I have provided informed consent. Date  Time: 12/16/2017  8:27 AM  Pre-Procedure Preparation:  Monitoring: As per clinic protocol. Respiration, ETCO2, SpO2, BP, heart rate and rhythm monitor placed and checked for adequate function Safety Precautions: Patient was assessed for positional comfort and pressure points before starting the procedure. Time-out: I initiated and conducted the "Time-out" before starting the procedure, as per protocol. The patient was asked to participate by confirming the accuracy of the "Time Out" information. Verification of the correct person, site, and procedure were performed and confirmed by me, the nursing staff, and the patient. "Time-out" conducted as per Joint Commission's Universal Protocol (UP.01.01.01). Time: 0917  Description of Procedure:       Position: Prone with head of the table was raised to facilitate breathing. Target Area: The interlaminar space, initially targeting the lower laminar border of the superior vertebral body. Approach: Paramedial approach. Area Prepped: Entire Posterior Lumbar Region Prepping solution: ChloraPrep (2% chlorhexidine  gluconate and 70% isopropyl alcohol) Safety Precautions: Aspiration looking for blood return was conducted prior to all injections. At no point did we inject any substances, as a needle was being advanced. No attempts were made at seeking any paresthesias. Safe injection practices and needle disposal techniques used. Medications properly checked for expiration dates. SDV (single dose vial) medications used. Description of the Procedure: Protocol guidelines were followed. The procedure needle was introduced through the skin, ipsilateral to the reported pain, and advanced to the target area. Bone was contacted and the needle walked caudad, until the  lamina was cleared. The epidural space was identified using "loss-of-resistance technique" with 2-3 ml of PF-NaCl (0.9% NSS), in a 5cc LOR glass syringe. Vitals:   12/16/17 0925 12/16/17 0935 12/16/17 0945 12/16/17 0955  BP: 110/80 (!) 110/59 108/62 (!) 107/58  Pulse:      Resp: 11 15 16 18   Temp:  98.2 F (36.8 C)    TempSrc:  Oral    SpO2: 94% 97% 98% 99%  Weight:      Height:        Start Time: 0917 hrs. End Time: 0923 hrs. Materials:  Needle(s) Type: Epidural needle Gauge: 17G Length: 3.5-in Medication(s): Please see orders for medications and dosing details.  Imaging Guidance (Spinal):  Type of Imaging Technique: Fluoroscopy Guidance (Spinal) Indication(s): Assistance in needle guidance and placement for procedures requiring needle placement in or near specific anatomical locations not easily accessible without such assistance. Exposure Time: Please see nurses notes. Contrast: None used. Fluoroscopic Guidance: I was personally present during the use of fluoroscopy. "Tunnel Vision Technique" used to obtain the best possible view of the target area. Parallax error corrected before commencing the procedure. "Direction-depth-direction" technique used to introduce the needle under continuous pulsed fluoroscopy. Once target was reached,  antero-posterior, oblique, and lateral fluoroscopic projection used confirm needle placement in all planes. Images permanently stored in EMR. Interpretation: No contrast injected. I personally interpreted the imaging intraoperatively. Adequate needle placement confirmed in multiple planes. Permanent images saved into the patient's record.  Antibiotic Prophylaxis:   Anti-infectives (From admission, onward)   None     Indication(s): None identified  Post-operative Assessment:  Post-procedure Vital Signs:  Pulse Rate: 68 Temp: 98.2 F (36.8 C) Resp: 18 BP: (!) 107/58 SpO2: 99 %  EBL: None  Complications: No immediate post-treatment complications observed by team, or reported by patient.  Note: The patient tolerated the entire procedure well. A repeat set of vitals were taken after the procedure and the patient was kept under observation following institutional policy, for this type of procedure. Post-procedural neurological assessment was performed, showing return to baseline, prior to discharge. The patient was provided with post-procedure discharge instructions, including a section on how to identify potential problems. Should any problems arise concerning this procedure, the patient was given instructions to immediately contact us, at any time, without hesitation. In any case, we plan to contact the patient by telephone for a follow-up status report regarding this interventional procedure.  Comments:  No additional relevant information.  Plan of Care    Imaging Orders     DG C-Arm 1-60 Min-No Report Procedure Orders    No procedure(s) ordered today    Medications ordered for procedure: Meds ordered this encounter  Medications  . iopamidol (ISOVUE-M) 41 % intrathecal injection 10 mL    Must be Myelogram-compatible. If not available, you may substitute with a water-soluble, non-ionic, hypoallergenic, myelogram-compatible radiological contrast medium.  Marland Kitchen lidocaine (XYLOCAINE) 2  % (with pres) injection 400 mg  . midazolam (VERSED) 5 MG/5ML injection 1-2 mg    Make sure Flumazenil is available in the pyxis when using this medication. If oversedation occurs, administer 0.2 mg IV over 15 sec. If after 45 sec no response, administer 0.2 mg again over 1 min; may repeat at 1 min intervals; not to exceed 4 doses (1 mg)  . fentaNYL (SUBLIMAZE) injection 25-50 mcg    Make sure Narcan is available in the pyxis when using this medication. In the event of respiratory depression (RR< 8/min): Titrate NARCAN (naloxone) in  increments of 0.1 to 0.2 mg IV at 2-3 minute intervals, until desired degree of reversal.  . lactated ringers infusion 1,000 mL  . sodium chloride flush (NS) 0.9 % injection 2 mL  . ropivacaine (PF) 2 mg/mL (0.2%) (NAROPIN) injection 2 mL  . triamcinolone acetonide (KENALOG-40) injection 40 mg  . glycopyrrolate (ROBINUL) injection 0.2 mg  . HYDROcodone-acetaminophen (NORCO) 7.5-325 MG tablet    Sig: Take 1 tablet by mouth every 6 (six) hours as needed for severe pain.    Dispense:  30 tablet    Refill:  0    Do not place this medication, or any other prescription from our practice, on "Automatic Refill". Patient may have prescription filled one day early if pharmacy is closed on scheduled refill date. Do not fill until: 12/16/17 To last until: 03/16/18   Medications administered: We administered midazolam and fentaNYL.  See the medical record for exact dosing, route, and time of administration.  New Prescriptions   No medications on file   Disposition: Discharge home  Discharge Date & Time: 12/16/2017; 1000 hrs.   Physician-requested Follow-up: Return for post-procedure eval (2 wks), w/ Dr. Dossie Arbour.  Future Appointments  Date Time Provider [place]  12/19/2017 10:20 AM Minna Merritts, MD [id number]  12/25/2017 10:30 AM Milinda Pointer, MD [facility] None  01/05/2018  8:45 AM Vevelyn Francois, NP [facility] None  01/22/2018  9:30 AM  Ursula Alert, MD [facility] None   Primary Care Physician: Arnetha Courser, MD Location: [facility] Note by: Gaspar Cola, MD Date: 12/16/2017; Time: 9:32 AM  Disclaimer:  Medicine is not an exact science. The only guarantee in medicine is that nothing is guaranteed. It is important to note that the decision to proceed with this intervention was based on the information collected from the patient. The Data and conclusions were drawn from the patient's questionnaire, the interview, and the physical examination. Because the information was provided in large part by the patient, it cannot be guaranteed that it has not been purposely or unconsciously manipulated. Every effort has been made to obtain as much relevant data as possible for this evaluation. It is important to note that the conclusions that lead to this procedure are derived in large part from the available data. Always take into account that the treatment will also be dependent on availability of resources and existing treatment guidelines, considered by other Pain Management Practitioners as being common knowledge and practice, at the time of the intervention. For Medico-Legal purposes, it is also important to point out that variation in procedural techniques and pharmacological choices are the acceptable norm. The indications, contraindications, technique, and results of the above procedure should only be interpreted and judged by a Board-Certified Interventional Pain Specialist with extensive familiarity and expertise in the same exact procedure and technique.

## 2017-12-16 NOTE — Patient Instructions (Addendum)
____________________________________________________________________________________________  Post-Procedure Discharge Instructions  Instructions:  Apply ice: Fill a plastic sandwich bag with crushed ice. Cover it with a small towel and apply to injection site. Apply for 15 minutes then remove x 15 minutes. Repeat sequence on day of procedure, until you go to bed. The purpose is to minimize swelling and discomfort after procedure.  Apply heat: Apply heat to procedure site starting the day following the procedure. The purpose is to treat any soreness and discomfort from the procedure.  Food intake: Start with clear liquids (like water) and advance to regular food, as tolerated.   Physical activities: Keep activities to a minimum for the first 8 hours after the procedure.   Driving: If you have received any sedation, you are not allowed to drive for 24 hours after your procedure.  Blood thinner: Restart your blood thinner 6 hours after your procedure. (Only for those taking blood thinners)  Insulin: As soon as you can eat, you may resume your normal dosing schedule. (Only for those taking insulin)  Infection prevention: Keep procedure site clean and dry.  Post-procedure Pain Diary: Extremely important that this be done correctly and accurately. Recorded information will be used to determine the next step in treatment.  Pain evaluated is that of treated area only. Do not include pain from an untreated area.  Complete every hour, on the hour, for the initial 8 hours. Set an alarm to help you do this part accurately.  Do not go to sleep and have it completed later. It will not be accurate.  Follow-up appointment: Keep your follow-up appointment after the procedure. Usually 2 weeks for most procedures. (6 weeks in the case of radiofrequency.) Bring you pain diary.   Expect:  From numbing medicine (AKA: Local Anesthetics): Numbness or decrease in pain.  Onset: Full effect within 15  minutes of injected.  Duration: It will depend on the type of local anesthetic used. On the average, 1 to 8 hours.   From steroids: Decrease in swelling or inflammation. Once inflammation is improved, relief of the pain will follow.  Onset of benefits: Depends on the amount of swelling present. The more swelling, the longer it will take for the benefits to be seen. In some cases, up to 10 days.  Duration: Steroids will stay in the system x 2 weeks. Duration of benefits will depend on multiple posibilities including persistent irritating factors.  From procedure: Some discomfort is to be expected once the numbing medicine wears off. This should be minimal if ice and heat are applied as instructed.  Call if:  You experience numbness and weakness that gets worse with time, as opposed to wearing off.  New onset bowel or bladder incontinence. (This applies to Spinal procedures only)  Emergency Numbers:  [place] business hours (Monday - Thursday, 8:00 AM - 4:00 PM) (Friday, 9:00 AM - 12:00 Noon): [phone]  After hours: [phone] ____________________________________________________________________________________________   ____________________________________________________________________________________________  Medication Rules  Applies to: All patients receiving prescriptions (written or electronic).  Pharmacy of record: Pharmacy where electronic prescriptions will be sent. If written prescriptions are taken to a different pharmacy, please inform the nursing staff. The pharmacy listed in the electronic medical record should be the one where you would like electronic prescriptions to be sent.  Prescription refills: Only during scheduled appointments. Applies to both, written and electronic prescriptions.  NOTE: The following applies primarily to controlled substances (Opioid* Pain Medications).   Patient's responsibilities: 1. Pain Pills: Bring all pain pills to every  appointment (except for procedure appointments). 2. Pill Bottles: Bring pills in original pharmacy bottle. Always bring newest bottle. Bring bottle, even if empty. 3. Medication refills: You are responsible for knowing and keeping track of what medications you need refilled. The day before your appointment, write a list of all prescriptions that need to be refilled. Bring that list to your appointment and give it to the admitting nurse. Prescriptions will be written only during appointments. If you forget a medication, it will not be "Called in", "Faxed", or "electronically sent". You will need to get another appointment to get these prescribed. 4. Prescription Accuracy: You are responsible for carefully inspecting your prescriptions before leaving our office. Have the discharge nurse carefully go over each prescription with you, before taking them home. Make sure that your name is accurately spelled, that your address is correct. Check the name and dose of your medication to make sure it is accurate. Check the number of pills, and the written instructions to make sure they are clear and accurate. Make sure that you are given enough medication to last until your next medication refill appointment. 5. Taking Medication: Take medication as prescribed. Never take more pills than instructed. Never take medication more frequently than prescribed. Taking less pills or less frequently is permitted and encouraged, when it comes to controlled substances (written prescriptions).  6. Inform other Doctors: Always inform, all of your healthcare providers, of all the medications you take. 7. Pain Medication from other Providers: You are not allowed to accept any additional pain medication from any other Doctor or Healthcare provider. There are two exceptions to this rule. (see below) In the event that you require additional pain medication, you are responsible for notifying us, as stated below. 8. Medication Agreement: You  are responsible for carefully reading and following our Medication Agreement. This must be signed before receiving any prescriptions from our practice. Safely store a copy of your signed Agreement. Violations to the Agreement will result in no further prescriptions. (Additional copies of our Medication Agreement are available upon request.) 9. Laws, Rules, & Regulations: All patients are expected to follow all Federal and [employer], [employer], Rules, [employer]. Ignorance of the Laws does not constitute a valid excuse. The use of any illegal substances is prohibited. 10. Adopted CDC guidelines & recommendations: Target dosing levels will be at or below 60 MME/day. Use of benzodiazepines** is not recommended.  Exceptions: There are only two exceptions to the rule of not receiving pain medications from other Healthcare Providers. 1. Exception #1 (Emergencies): In the event of an emergency (i.e.: accident requiring emergency care), you are allowed to receive additional pain medication. However, you are responsible for: As soon as you are able, call our office [phone], at any time of the day or night, and leave a message stating your name, the date and nature of the emergency, and the name and dose of the medication prescribed. In the event that your call is answered by a member of our staff, make sure to document and save the date, time, and the name of the person that took your information.  2. Exception #2 (Planned Surgery): In the event that you are scheduled by another doctor or dentist to have any type of surgery or procedure, you are allowed (for a period no longer than 30 days), to receive additional pain medication, for the acute post-op pain. However, in this case, you are responsible for picking up a copy of our "Post-op Pain Management for Surgeons"  handout, and giving it to your surgeon or dentist. This document is available at our office, and does not require an appointment to obtain it.  Simply go to our office during business hours (Monday-Thursday from 8:00 AM to 4:00 PM) (Friday 8:00 AM to 12:00 Noon) or if you have a scheduled appointment with [place], prior to your surgery, and ask for it by name. In addition, you will need to provide [place] with your name, name of your surgeon, type of surgery, and date of procedure or surgery.  *Opioid medications include: morphine, codeine, oxycodone, oxymorphone, hydrocodone, hydromorphone, meperidine, tramadol, tapentadol, buprenorphine, fentanyl, methadone. **Benzodiazepine medications include: diazepam (Valium), alprazolam (Xanax), clonazepam (Klonopine), lorazepam (Ativan), clorazepate (Tranxene), chlordiazepoxide (Librium), estazolam (Prosom), oxazepam (Serax), temazepam (Restoril), triazolam (Halcion) (Last updated: 11/27/2017) ____________________________________________________________________________________________  ____________________________________________________________________________________________  Medication Recommendations and Reminders  Applies to: All patients receiving prescriptions (written and/or electronic).  Medication Rules & Regulations: These rules and regulations exist for your safety and that of others. They are not flexible and neither are we. Dismissing or ignoring them will be considered "non-compliance" with medication therapy, resulting in complete and irreversible termination of such therapy. (See document titled "Medication Rules" for more details.) In all conscience, because of safety reasons, we cannot continue providing a therapy where the patient does not follow instructions.  Pharmacy of record:   Definition: This is the pharmacy where your electronic prescriptions will be sent.   We do not endorse any particular pharmacy.  You are not restricted in your choice of pharmacy.  The pharmacy listed in the electronic medical record should be the one where you want electronic prescriptions to be  sent.  If you choose to change pharmacy, simply notify our nursing staff of your choice of new pharmacy.  Recommendations:  Keep all of your pain medications in a safe place, under lock and key, even if you live alone.   After you fill your prescription, take 1 week's worth of pills and put them away in a safe place. You should keep a separate, properly labeled bottle for this purpose. The remainder should be kept in the original bottle. Use this as your primary supply, until it runs out. Once it's gone, then you know that you have 1 week's worth of medicine, and it is time to come in for a prescription refill. If you do this correctly, it is unlikely that you will ever run out of medicine.  To make sure that the above recommendation works, it is very important that you make sure your medication refill appointments are scheduled at least 1 week before you run out of medicine. To do this in an effective manner, make sure that you do not leave the office without scheduling your next medication management appointment. Always ask the nursing staff to show you in your prescription , when your medication will be running out. Then arrange for the receptionist to get you a return appointment, at least 7 days before you run out of medicine. Do not wait until you have 1 or 2 pills left, to come in. This is very poor planning and does not take into consideration that we may need to cancel appointments due to bad weather, sickness, or emergencies affecting our staff.  Prescription refills and/or changes in medication(s):   Prescription refills, and/or changes in dose or medication, will be conducted only during scheduled medication management appointments. (Applies to both, written and electronic prescriptions.)  No refills on procedure days. No medication will be changed or started on procedure  days. No changes, adjustments, and/or refills will be conducted on a procedure day. Doing so will interfere with the  diagnostic portion of the procedure.  No phone refills. No medications will be "called into the pharmacy".  No Fax refills.  No weekend refills.  No [place] refills.  No after hours refills.  Remember:  Business hours are:  Monday to Thursday 8:00 AM to 4:00 PM Provider's Schedule: Dionisio David, NP - Appointments are:  Medication management: Monday to Thursday 8:00 AM to 4:00 PM Milinda Pointer, MD - Appointments are:  Medication management: Monday and Wednesday 8:00 AM to 4:00 PM Procedure day: Tuesday and Thursday 7:30 AM to 4:00 PM Gillis Santa, MD - Appointments are:  Medication management: Tuesday and Thursday 8:00 AM to 4:00 PM Procedure day: Monday and Wednesday 7:30 AM to 4:00 PM (Last update: 11/27/2017) ____________________________________________________________________________________________  Pain Management Discharge Instructions  General Discharge Instructions :  If you need to reach your doctor call: Monday-Friday 8:00 am - 4:00 pm at [phone] or toll free [phone].  After clinic hours [phone] to have operator reach doctor.  Bring all of your medication bottles to all your appointments in the pain clinic.  To cancel or reschedule your appointment with Pain Management please remember to call 24 hours in advance to avoid a fee.  Refer to the educational materials which you have been given on: General Risks, I had my Procedure. Discharge Instructions, Post Sedation.  Post Procedure Instructions:  The drugs you were given will stay in your system until tomorrow, so for the next 24 hours you should not drive, make any legal decisions or drink any alcoholic beverages.  You may eat anything you prefer, but it is better to start with liquids then soups and crackers, and gradually work up to solid foods.  Please notify your doctor immediately if you have any unusual bleeding, trouble breathing or pain that is not related to your normal  pain.  Depending on the type of procedure that was done, some parts of your body may feel week and/or numb.  This usually clears up by tonight or the next day.  Walk with the use of an assistive device or accompanied by an adult for the 24 hours.  You may use ice on the affected area for the first 24 hours.  Put ice in a Ziploc bag and cover with a towel and place against area 15 minutes on 15 minutes off.  You may switch to heat after 24 hours.

## 2017-12-17 ENCOUNTER — Telehealth: Payer: Self-pay

## 2017-12-17 NOTE — Telephone Encounter (Signed)
-----   Message from Arnetha Courser, MD sent at 12/16/2017  5:15 PM EDT ----- Guerry Minors, please let pt know that her cortisol level came back completely normal; I'm glad we rechecked it; her CK level is normal; I do believe she has chronic fatigue syndrome, with these last labs coming back normal She has brought her LDl cholesterol down from 182 to 74 -- that's fabulous! Her TG are still high, but she can bring that down with diet and weight loss and some fish oil or krill oil BID

## 2017-12-17 NOTE — Progress Notes (Signed)
Cardiology Office Note  Date:  12/19/2017   ID:  Dana Bradley, DOB 07-18-1971, MRN [id number]  PCP:  Dana Courser, MD   Chief Complaint  Patient presents with  . Other    Per Dr. Sanda Bradley for PSVT, Pulm HTN, and Chest Pain. Patient c/o SOB, swelling, and chest pain. Patient states she does not think it is her lungs, it is her heart. Meds reviewed verbally with patient.     HPI:  [name] s a 73 short woman with past medical history of Morbid obesity chronic fatigue syndrome SVT with ablation osa on CPAP Migraines Chronic back pain Smoker SOB Leg edema Referred by Dr. Sanda Bradley for consultation of her SVT, pulm HTN, chest pain  She reports having tachycardia and SVT for many years Better with metoprolol artery twice a day "Ablation did not work" Atenolol caused swelling  Biggest complaint is episodes of chest pain associated with shortness of breath Symptoms typically come on with exertion but can present at rest She describes it as someone twisting her heart "heart hurts", gets dizzy in the setting of having these symptoms Gasping,unable to catch her breath,  lasts for 1 min to 10 min Happens 2-3 x per week, sometimes will not have any for several weeks More in the day In the evening Worse when  Exercising  Reports having significant pressure in her head when she bends over Feels like lots of blood is rushing to her head Cuts of her air supply, get short of breath  Previously seen by [facility] cardiology, last in 05/2017 Previous echocardiogram was normal, previous stress test August 2016 was normal Had holter as part of her workup Results reviewed  No chest CT  Echo 12/12/2017 Left ventricle: The cavity size was normal. Systolic function was normal. The estimated ejection fraction was in the range of 60% to 65%. Wall motion was normal; there were no regional wall  motion abnormalities. Left ventricular diastolic function parameters were normal. - Left atrium: The atrium  was mildly dilated. - Right ventricle: Systolic function was normal. - Pulmonary arteries: Systolic pressure was mildly elevated. PA peak pressure: 35 mm Hg (S).  EKG personally reviewed by myself on todays visit  shows normal sinus rhythm with no significant ST or T-wave changes EKGs dated February 21st 2019  Previous stress test reviewed Bruce protocol 6 minutes 17 seconds achieved 7.8 METS Achieve target heart rate 173 bpm Overall normal treadmill study with no EKG changes concerning for ischemia  PMH:   has a past medical history of Acute postoperative pain (04/07/2017), Anxiety, Bursitis, Chronic fatigue (12/12/2017), Edema leg (05/02/2015), Fibromyalgia, GERD (gastroesophageal reflux disease), IBS (irritable bowel syndrome), Knee pain, bilateral (12/21/2008), Lumbar discitis, Osteoarthritis, Right hand pain (04/10/2015), Sleep apnea, SVT (supraventricular tachycardia) ([place]), Vertigo, and Vitamin D deficiency (05/01/2016).  PSH:    Past Surgical History:  Procedure Laterality Date  . ABLATION     Uterine  . CARDIAC CATHETERIZATION     with ablation  . COLONOSCOPY WITH PROPOFOL N/A 05/17/2015   Procedure: COLONOSCOPY WITH PROPOFOL;  Surgeon: Dana Silvas, MD;  Location[facility] ENDOSCOPY;  Service: Endoscopy;  Laterality: N/A;  . ESOPHAGOGASTRODUODENOSCOPY N/A 05/17/2015   Procedure: ESOPHAGOGASTRODUODENOSCOPY (EGD);  Surgeon: Dana Silvas, MD;  Location: [facility] ENDOSCOPY;  Service: Endoscopy;  Laterality: N/A;  . KNEE ARTHROSCOPY    . spg     6/18  . TUBAL LIGATION  10/01/99    Current Outpatient Medications  Medication Sig Dispense Refill  . acetaminophen (TYLENOL)  325 MG tablet Take 650 mg by mouth as needed.     . Calcium-Vitamin D-Vitamin K 299-371-69 MG-UNT-MCG TABS Take 1 tablet by mouth daily.     . clonazePAM (KLONOPIN) 0.5 MG tablet Take 1 tablet (0.5 mg total) by mouth 2 (two) times daily as needed for anxiety (only for severe panic sx). 60 tablet 0  . Cyanocobalamin  (VITAMIN B-12) 500 MCG SUBL Place 3 tablets (1,500 mcg total) under the tongue daily. 150 tablet   . diclofenac sodium (VOLTAREN) 1 % GEL Apply 2 g topically 4 (four) times daily. (Patient taking differently: Apply 2 g topically as needed. ) 1 Tube 2  . Fremanezumab-vfrm (AJOVY) 225 MG/1.5ML SOSY Inject into the skin.    Marland Kitchen HYDROcodone-acetaminophen (NORCO) 7.5-325 MG tablet Take 1 tablet by mouth every 6 (six) hours as needed for severe pain. 30 tablet 0  . metoprolol tartrate (LOPRESSOR) 50 MG tablet TAKE 1 TABLET BY MOUTH TWICE A DAY 180 tablet 3  . omeprazole (PRILOSEC) 20 MG capsule Take 1 capsule (20 mg total) by mouth daily. 30 capsule 0  . ondansetron (ZOFRAN) 4 MG tablet Take 1 tablet (4 mg total) every 8 (eight) hours as needed by mouth. 20 tablet 2  . OXcarbazepine (TRILEPTAL) 150 MG tablet Take 1 tablet (150 mg total) by mouth 2 (two) times daily. 60 tablet 2  . pregabalin (LYRICA) 150 MG capsule Take 1 capsule (150 mg total) by mouth every 8 (eight) hours. 90 capsule 2  . rosuvastatin (CRESTOR) 10 MG tablet Take 1 tablet (10 mg total) by mouth daily. 90 tablet 3  . venlafaxine XR (EFFEXOR XR) 75 MG 24 hr capsule Take 1 capsule (75 mg total) by mouth daily with breakfast. 30 capsule 2  . Vitamin D, Ergocalciferol, (DRISDOL) 50000 units CAPS capsule Take 1 capsule (50,000 Units total) by mouth every 7 (seven) days. 4 capsule 1  . baclofen (LIORESAL) 10 MG tablet Take 1 tablet (10 mg total) 3 (three) times daily by mouth. 90 tablet 2   No current facility-administered medications for this visit.      Allergies:   Aspirin; Cymbalta [duloxetine hcl]; Demerol [meperidine]; Depakote [divalproex sodium]; Duloxetine; Haloperidol; Iodinated diagnostic agents; Iodine; Reglan [metoclopramide]; Tramadol hcl; Trazodone; Compazine [prochlorperazine]; Meloxicam; Penicillins; Tomato; Shellfish allergy; Shellfish-derived products; Bacitracin-neomycin-polymyxin; Cephalosporins; Ibuprofen; Latex; Neosporin  [neomycin-bacitracin zn-polymyx]; Nsaids; Sulfa antibiotics; Sulfasalazine; and Sulfonamide derivatives   Social History:  The patient  reports that she quit smoking about 25 years ago. Her smoking use included cigarettes. She has a 12.00 pack-year smoking history. She has never used smokeless tobacco. She reports that she does not drink alcohol or use drugs.   Family History:   family history includes Alcohol abuse in her father; Alzheimer's disease in her paternal grandmother and unknown relative; Aneurysm in her maternal grandfather; Anxiety disorder in her father, mother, sister, and sister; Arthritis in her maternal grandmother; Bipolar disorder in her sister; COPD in her paternal grandfather; Cancer in her mother; Cancer (age of onset: 52) in her maternal grandmother; Depression in her father, mother, sister, and sister; Diabetes in her sister; Heart attack in her paternal grandfather; Heart disease in her father, maternal grandfather, and paternal grandfather; Hyperlipidemia in her maternal grandmother, mother, and sister; Hypertension in her father, maternal grandfather, mother, paternal grandfather, sister, and sister; Polycystic ovary syndrome in her sister; Stroke in her father; Thyroid disease in her maternal grandmother.    Review of Systems: Review of Systems  Constitutional: Negative.   Respiratory: Positive for shortness  of breath.   Cardiovascular: Positive for chest pain.  Gastrointestinal: Negative.   Musculoskeletal: Negative.   Neurological: Negative.   Psychiatric/Behavioral: Negative.   All other systems reviewed and are negative.    PHYSICAL EXAM: VS:  BP 116/74 (BP Location: Right Arm, Patient Position: Sitting, Cuff Size: Normal)   Pulse 63   Ht 5\' 3"  (1.6 m)   Wt 214 lb (97.1 kg)   LMP 11/24/2017   SpO2 98%   BMI 37.91 kg/m  , BMI Body mass index is 37.91 kg/m. GEN: Well nourished, well developed, in no acute distress  HEENT: normal  Neck: no JVD, carotid  bruits, or masses Cardiac: RRR; no murmurs, rubs, or gallops,no edema  Respiratory:  clear to auscultation bilaterally, normal work of breathing GI: soft, nontender, nondistended, + BS MS: no deformity or atrophy  Skin: warm and dry, no rash Neuro:  Strength and sensation are intact Psych: euthymic mood, full affect    Recent Labs: 04/01/2017: Magnesium 2.1 05/02/2017: Brain Natriuretic Peptide 14.2; TSH 2.63 11/24/2017: ALT 21; BUN 13; Creat 0.80; Hemoglobin 13.9; Platelets 299; Potassium 4.2; Sodium 141    Lipid Panel Lab Results  Component Value Date   CHOL 155 12/15/2017   HDL 39 (L) 12/15/2017   LDLCALC 74 12/15/2017   TRIG 349 (H) 12/15/2017      Wt Readings from Last 3 Encounters:  12/19/17 214 lb (97.1 kg)  12/16/17 218 lb (98.9 kg)  12/12/17 218 lb 11.2 oz (99.2 kg)       ASSESSMENT AND PLAN:  Paroxysmal supraventricular tachycardia (HCC) Symptoms seem to be relatively well-controlled on metoprolol tartrate twice a day She reports previous attempt at ablation was unsuccessful  Bilateral leg edema No significant pitting edema, Leg swelling likely dependent edema, recommended weight loss compression hose if needed  GAD (generalized anxiety disorder) Managed by primary care  Chronic pain syndrome Managed by pain clinic Notes indicating history of fibromyalgia  Mixed hyperlipidemia Cholesterol is at goal on the current lipid regimen. No changes to the medications were made.  Chest pain, unspecified type - Plan: CT Chest Wo Contrast Atypical chest pain, etiology unclear CT chests for coronary calcification measurement has been ordered also to rule out other pathology Unable to exclude spasm/esophageal, coronary She could try nitroglycerin or long-acting nitroglycerin such as isosorbide for symptom relief Also suggested she could have treadmill stress echo to try to reproduce chest pain symptoms If CT looks benign  Hypertension, benign essential, goal below  140/90 Blood pressure is well controlled on today's visit. No changes made to the medications.  Exertional shortness of breath Episodes of breathing with associated chest pain etiology unclear as detailed above No significant shortness of breath at baseline Unable to exclude some kind of spasm We could consider respiratory/pulmonary rehabilitation if she continues to have symptoms  Disposition:   F/U  As needed  Extensive records reviewed Significant time spent discussing she details above with the patient and husband in the room today CT scan ordered as above  Total encounter time more than 80 minutes  Greater than 50% was spent in counseling and coordination of care with the patient  Patient seen in consultation by Dr. Sanda Bradley he'll be referred back to her office for ongoing care of the issues detailed above   Orders Placed This Encounter  Procedures  . CT Chest Wo Contrast     Signed, Esmond Plants, M.D., Ph.D. 12/19/2017  [place], [place]

## 2017-12-17 NOTE — Assessment & Plan Note (Signed)
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

## 2017-12-17 NOTE — Telephone Encounter (Signed)
Post procedure phone call.  Patient states she is doing OK 

## 2017-12-17 NOTE — Telephone Encounter (Signed)
Called pt no answer. Unable to leave message as no voicemail is set up. Will Call again, CRM created. Labs routed to [facility].

## 2017-12-18 LAB — CK: Total CK: 40 U/L (ref 29–143)

## 2017-12-18 LAB — LIPID PANEL
CHOL/HDL RATIO: 4 (calc) (ref <5.0)
CHOLESTEROL: 155 mg/dL (ref <200)
HDL: 39 mg/dL — AB (ref >50)
LDL CHOLESTEROL (CALC): 74 mg/dL
NON-HDL CHOLESTEROL (CALC): 116 mg/dL (ref <130)
Triglycerides: 349 mg/dL — ABNORMAL HIGH (ref <150)

## 2017-12-18 LAB — ANA,IFA RA DIAG PNL W/RFLX TIT/PATN
Anti Nuclear Antibody(ANA): NEGATIVE
Cyclic Citrullin Peptide Ab: <16 UNITS

## 2017-12-18 LAB — CORTISOL: CORTISOL PLASMA: 15.9 ug/dL

## 2017-12-18 NOTE — Telephone Encounter (Signed)
Called pt informed her of the lab results below. Pt gave verbal understanding.

## 2017-12-19 ENCOUNTER — Ambulatory Visit: Payer: Medicaid Other | Admitting: Cardiovascular Disease

## 2017-12-19 ENCOUNTER — Telehealth: Payer: Self-pay

## 2017-12-19 ENCOUNTER — Encounter: Payer: Self-pay | Admitting: Cardiovascular Disease

## 2017-12-19 VITALS — BP 116/74 | HR 63 | Ht 63.0 in | Wt 214.0 lb

## 2017-12-19 DIAGNOSIS — G894 Chronic pain syndrome: Secondary | ICD-10-CM

## 2017-12-19 DIAGNOSIS — E782 Mixed hyperlipidemia: Secondary | ICD-10-CM | POA: Diagnosis not present

## 2017-12-19 DIAGNOSIS — I10 Essential (primary) hypertension: Secondary | ICD-10-CM | POA: Diagnosis not present

## 2017-12-19 DIAGNOSIS — R079 Chest pain, unspecified: Secondary | ICD-10-CM | POA: Diagnosis not present

## 2017-12-19 DIAGNOSIS — F411 Generalized anxiety disorder: Secondary | ICD-10-CM

## 2017-12-19 DIAGNOSIS — R600 Localized edema: Secondary | ICD-10-CM | POA: Diagnosis not present

## 2017-12-19 DIAGNOSIS — R5382 Chronic fatigue, unspecified: Secondary | ICD-10-CM

## 2017-12-19 DIAGNOSIS — M797 Fibromyalgia: Secondary | ICD-10-CM

## 2017-12-19 DIAGNOSIS — I471 Supraventricular tachycardia: Secondary | ICD-10-CM

## 2017-12-19 DIAGNOSIS — R0602 Shortness of breath: Secondary | ICD-10-CM

## 2017-12-19 NOTE — Telephone Encounter (Signed)
-----   Message from Arnetha Courser, MD sent at 12/18/2017  4:10 PM EDT ----- Guerry Minors, please let pt know that her ana test is negative; good news

## 2017-12-19 NOTE — Patient Instructions (Addendum)
Call if you would like to try nitro for chest pain  If CT scan is normal We could try a treadmill echo   Call if you would like a diuretic pill (lasix)   Medication Instructions:   No medication changes made  Labwork:  No new labs needed  Testing/Procedures:  We will order a CT chest with no contrast For chest pain, shortness of breath   Monday March 25, arrive at 3:45pm [facility] [place], [place] ([address], [place]) [phone]   Follow-Up: It was a pleasure seeing you in the office today. Please call us if you have new issues that need to be addressed before your next appt.  [phone]  Your physician wants you to follow-up in:  As needed  If you need a refill on your cardiac medications before your next appointment, please call your pharmacy.  For educational health videos Log in to : [email or web address] Or : [email or web address], password : [email or web address]

## 2017-12-19 NOTE — Telephone Encounter (Signed)
Called pt no answer. Unable to leave message as no voicemail answers. Will call again. CRM created. Labs routed to [facility].

## 2017-12-22 ENCOUNTER — Telehealth: Payer: Self-pay | Admitting: Cardiovascular Disease

## 2017-12-22 ENCOUNTER — Ambulatory Visit: Admission: RE | Admit: 2017-12-22 | Payer: Medicaid Other | Source: Ambulatory Visit

## 2017-12-22 DIAGNOSIS — R079 Chest pain, unspecified: Secondary | ICD-10-CM

## 2017-12-22 NOTE — Telephone Encounter (Signed)
Called pt, no answer. LM for pt informing her of negative results.

## 2017-12-22 NOTE — Telephone Encounter (Signed)
Spoke with patient and reviewed that her test was not going to be covered by her insurance and that Dr. Rockey Situ recommended either a stress test or CT Calcium scoring. Patient verbalized understanding and chose stress testing. Reviewed all instructions for her testing and confirmed date, time, and location with her. She verbalized understanding with no further questions at this time.   [place]  Your caregiver has ordered a Stress Test with nuclear imaging. The purpose of this test is to evaluate the blood supply to your heart muscle. This procedure is referred to as a "Non-Invasive Stress Test." This is because other than having an IV started in your vein, nothing is inserted or "invades" your body. Cardiac stress tests are done to find areas of poor blood flow to the heart by determining the extent of coronary artery disease (CAD).   Please note: these test may take anywhere between 2-4 hours to complete  PLEASE REPORT TO [facility] AT THE FIRST DESK WILL DIRECT YOU WHERE TO GO  Date of Procedure:_Thursday 4/4/19__  Arrival Time for Procedure:___08:45AM__  Instructions regarding medication:   __X__:  Hold metoprolol the night before procedure and morning of procedure   PLEASE NOTIFY THE OFFICE AT LEAST 24 HOURS IN ADVANCE IF YOU ARE UNABLE TO KEEP YOUR APPOINTMENT.  [phone] AND  PLEASE NOTIFY NUCLEAR MEDICINE AT [facility] AT LEAST 24 HOURS IN ADVANCE IF YOU ARE UNABLE TO KEEP YOUR APPOINTMENT. [phone]  How to prepare for your Myoview test:  1. Do not eat or drink after midnight 2. No caffeine for 24 hours prior to test 3. No smoking 24 hours prior to test. 4. Your medication may be taken with water.  If your doctor stopped a medication because of this test, do not take that medication. 5. Ladies, please do not wear dresses.  Skirts or pants are appropriate. Please wear a short sleeve shirt. 6. No perfume, cologne or lotion. 7. Wear comfortable  walking shoes. No heels.

## 2017-12-25 ENCOUNTER — Encounter: Payer: Self-pay | Admitting: Pain Medicine

## 2017-12-25 ENCOUNTER — Ambulatory Visit
Admission: RE | Admit: 2017-12-25 | Discharge: 2017-12-25 | Disposition: A | Discharge status: Home / Self Care | Payer: Medicaid Other | Source: Ambulatory Visit | Attending: Pain Medicine | Admitting: Pain Medicine

## 2017-12-25 ENCOUNTER — Other Ambulatory Visit: Payer: Self-pay

## 2017-12-25 ENCOUNTER — Ambulatory Visit (HOSPITAL_BASED_OUTPATIENT_CLINIC_OR_DEPARTMENT_OTHER): Payer: Medicaid Other | Admitting: Pain Medicine

## 2017-12-25 VITALS — BP 114/71 | HR 72 | Temp 98.3°F | Resp 15 | Ht 63.0 in | Wt 213.0 lb

## 2017-12-25 DIAGNOSIS — Z880 Allergy status to penicillin: Secondary | ICD-10-CM | POA: Diagnosis not present

## 2017-12-25 DIAGNOSIS — M5481 Occipital neuralgia: Secondary | ICD-10-CM

## 2017-12-25 DIAGNOSIS — Z91013 Allergy to seafood: Secondary | ICD-10-CM | POA: Insufficient documentation

## 2017-12-25 DIAGNOSIS — Z886 Allergy status to analgesic agent status: Secondary | ICD-10-CM | POA: Diagnosis not present

## 2017-12-25 DIAGNOSIS — R51 Headache: Secondary | ICD-10-CM

## 2017-12-25 DIAGNOSIS — Z91041 Radiographic dye allergy status: Secondary | ICD-10-CM | POA: Diagnosis not present

## 2017-12-25 DIAGNOSIS — G4486 Cervicogenic headache: Secondary | ICD-10-CM

## 2017-12-25 DIAGNOSIS — Z91040 Latex allergy status: Secondary | ICD-10-CM | POA: Diagnosis not present

## 2017-12-25 DIAGNOSIS — Z79891 Long term (current) use of opiate analgesic: Secondary | ICD-10-CM | POA: Diagnosis not present

## 2017-12-25 DIAGNOSIS — Z885 Allergy status to narcotic agent status: Secondary | ICD-10-CM | POA: Diagnosis not present

## 2017-12-25 DIAGNOSIS — Z882 Allergy status to sulfonamides status: Secondary | ICD-10-CM | POA: Diagnosis not present

## 2017-12-25 DIAGNOSIS — F419 Anxiety disorder, unspecified: Secondary | ICD-10-CM | POA: Insufficient documentation

## 2017-12-25 DIAGNOSIS — R519 Headache, unspecified: Secondary | ICD-10-CM | POA: Insufficient documentation

## 2017-12-25 DIAGNOSIS — G8929 Other chronic pain: Secondary | ICD-10-CM | POA: Insufficient documentation

## 2017-12-25 DIAGNOSIS — Z888 Allergy status to other drugs, medicaments and biological substances status: Secondary | ICD-10-CM | POA: Insufficient documentation

## 2017-12-25 DIAGNOSIS — Z79899 Other long term (current) drug therapy: Secondary | ICD-10-CM | POA: Diagnosis not present

## 2017-12-25 DIAGNOSIS — G8918 Other acute postprocedural pain: Secondary | ICD-10-CM

## 2017-12-25 DIAGNOSIS — Z881 Allergy status to other antibiotic agents status: Secondary | ICD-10-CM | POA: Diagnosis not present

## 2017-12-25 DIAGNOSIS — R55 Syncope and collapse: Secondary | ICD-10-CM

## 2017-12-25 MED ORDER — GLYCOPYRROLATE 0.2 MG/ML IJ SOLN
0.2000 mg | Freq: Once | INTRAMUSCULAR | Status: DC
  Filled 2017-12-25: qty 1

## 2017-12-25 MED ORDER — ROPIVACAINE HCL 2 MG/ML IJ SOLN
9.0000 mL | Freq: Once | INTRAMUSCULAR | Status: AC
  Administered 2017-12-25: 10 mL
  Filled 2017-12-25: qty 10

## 2017-12-25 MED ORDER — FENTANYL CITRATE (PF) 100 MCG/2ML IJ SOLN
25.0000 ug | INTRAMUSCULAR | Status: DC | PRN
  Administered 2017-12-25: 100 ug via INTRAVENOUS
  Filled 2017-12-25: qty 2

## 2017-12-25 MED ORDER — LIDOCAINE HCL 2 % IJ SOLN
20.0000 mL | Freq: Once | INTRAMUSCULAR | Status: AC
  Administered 2017-12-25: 400 mg
  Filled 2017-12-25: qty 20

## 2017-12-25 MED ORDER — OXYCODONE-ACETAMINOPHEN 5-325 MG PO TABS: 1.0000 | ORAL_TABLET | Freq: Three times a day (TID) | ORAL | 0 refills | Status: AC | PRN

## 2017-12-25 MED ORDER — MIDAZOLAM HCL 5 MG/5ML IJ SOLN
1.0000 mg | INTRAMUSCULAR | Status: DC | PRN
  Administered 2017-12-25: 3 mg via INTRAVENOUS
  Filled 2017-12-25: qty 5

## 2017-12-25 MED ORDER — DEXAMETHASONE SODIUM PHOSPHATE 10 MG/ML IJ SOLN
10.0000 mg | Freq: Once | INTRAMUSCULAR | Status: AC
  Administered 2017-12-25: 10 mg
  Filled 2017-12-25: qty 1

## 2017-12-25 MED ORDER — LACTATED RINGERS IV SOLN
1000.0000 mL | Freq: Once | INTRAVENOUS | Status: AC
  Administered 2017-12-25: 1000 mL via INTRAVENOUS

## 2017-12-25 NOTE — Progress Notes (Signed)
Safety precautions to be maintained throughout the outpatient stay will include: orient to surroundings, keep bed in low position, maintain call bell within reach at all times, provide assistance with transfer out of bed and ambulation.  

## 2017-12-25 NOTE — Progress Notes (Addendum)
Patient's Name: Dana Bradley  MRN: [id number]  Referring Provider: Arnetha Courser, MD  DOB: 1971-04-30  PCP: Arnetha Courser, MD  DOS: 12/25/2017  Note by: Gaspar Cola, MD  Service setting: Ambulatory outpatient  Specialty: Interventional Pain Management  Patient type: Established  Location: [facility] (AMB) Pain Management Facility  Visit type: Interventional Procedure   Primary Reason for Visit: Interventional Pain Management Treatment. CC: Headache (front and back)  Procedure #1:  Anesthesia, Analgesia, Anxiolysis:  Type: Therapeutic Occipital Nerve Radiofrequency Ablation Region: Posterolateral Cervical Level: Occipital Ridge   Laterality: Left-Sided  Type: Moderate (Conscious) Sedation combined with Local Anesthesia Indication(s): Analgesia and Anxiety Route: Intravenous (IV) IV Access: Secured Sedation: Meaningful verbal contact was maintained at all times during the procedure  Local Anesthetic: Lidocaine 1-2%  Position: Prone    Procedure #2:  Type: Cervical C2 Medial Branch + Third Occipital Nerve (TON) Radiofrequency Ablation Primary Purpose: Diagnostic Region: Posterolateral cervical spine region Level: C2 & C3 Medial Branch Level(s). Lesioning of these levels should completely denervate the occipital nerve. Laterality: Left Paraspinal   Indications: 1. Chronic occipital neuralgia [facility] source of pain) (Bilateral) (L>R)   2. Cervico-occipital neuralgia   3. Cervicogenic headache   4. Occipital headache    Dana Bradley has been dealing with the above chronic pain for longer than three months and has either failed to respond, was unable to tolerate, or simply did not get enough benefit from other more conservative therapies including, but not limited to: 1. Over-the-counter medications 2. Anti-inflammatory medications 3. Muscle relaxants 4. Membrane stabilizers 5. Opioids 6. Physical therapy 7. Modalities (Heat, ice, etc.) 8. Invasive techniques such as nerve  blocks. Dana Bradley has attained more than 50% relief of the pain from a series of diagnostic injections conducted in separate occasions.  Pain Score: Pre-procedure: 3 /10 Post-procedure: 2 /10  Pre-op Assessment:  Dana Bradley is a 47 y.o. (year old), female patient, seen today for interventional treatment. She  has a past surgical history that includes Ablation; Tubal ligation (10/01/99); Cardiac catheterization; Knee arthroscopy; Colonoscopy with propofol (N/A, 05/17/2015); Esophagogastroduodenoscopy (N/A, 05/17/2015); and spg. Dana Bradley has a current medication list which includes the following prescription(s): acetaminophen, baclofen, calcium-vitamin d-vitamin k, clonazepam, vitamin b-12, diclofenac sodium, fremanezumab-vfrm, hydrocodone-acetaminophen, metoprolol tartrate, omeprazole, ondansetron, oxcarbazepine, pregabalin, rosuvastatin, venlafaxine xr, vitamin d (ergocalciferol), and oxycodone-acetaminophen, and the following Facility-Administered Medications: fentanyl, glycopyrrolate, and midazolam. Her primarily concern today is the Headache (front and back)  Initial Vital Signs:  Pulse Rate: 72 Temp: 98.6 F (37 C) Resp: 16 BP: 114/66 SpO2: 99 %  BMI: Estimated body mass index is 37.73 kg/m as calculated from the following:   Height as of this encounter: 5\' 3"  (1.6 m).   Weight as of this encounter: 213 lb (96.6 kg).  Risk Assessment: Allergies: Reviewed. She is allergic to aspirin; cymbalta [duloxetine hcl]; demerol [meperidine]; depakote [divalproex sodium]; duloxetine; haloperidol; iodinated diagnostic agents; iodine; reglan [metoclopramide]; tramadol hcl; trazodone; compazine [prochlorperazine]; meloxicam; penicillins; tomato; shellfish allergy; shellfish-derived products; bacitracin-neomycin-polymyxin; cephalosporins; ibuprofen; latex; neosporin [neomycin-bacitracin zn-polymyx]; nsaids; sulfa antibiotics; sulfasalazine; and sulfonamide derivatives.  Allergy Precautions: No iodine  containing solutions or radiological contrast used.  In addition, latex free protocol activated. Coagulopathies: Reviewed. None identified.  Blood-thinner therapy: None at this time Active Infection(s): Reviewed. None identified. Dana Bradley is afebrile  Site Confirmation: Dana Bradley was asked to confirm the procedure and laterality before marking the site Procedure checklist: Completed Consent: Before the procedure and under the influence of no sedative(s), amnesic(s), or anxiolytics,  the patient was informed of the treatment options, risks and possible complications. To fulfill our ethical and legal obligations, as recommended by the American Medical Association's Code of Ethics, I have informed the patient of my clinical impression; the nature and purpose of the treatment or procedure; the risks, benefits, and possible complications of the intervention; the alternatives, including doing nothing; the risk(s) and benefit(s) of the alternative treatment(s) or procedure(s); and the risk(s) and benefit(s) of doing nothing. The patient was provided information about the general risks and possible complications associated with the procedure. These may include, but are not limited to: failure to achieve desired goals, infection, bleeding, organ or nerve damage, allergic reactions, paralysis, and death. In addition, the patient was informed of those risks and complications associated to the procedure, such as failure to decrease pain; infection; bleeding; organ or nerve damage with subsequent damage to sensory, motor, and/or autonomic systems, resulting in permanent pain, numbness, and/or weakness of one or several areas of the body; allergic reactions; (i.e.: anaphylactic reaction); and/or death. Furthermore, the patient was informed of those risks and complications associated with the medications. These include, but are not limited to: allergic reactions (i.e.: anaphylactic or anaphylactoid reaction(s)); adrenal  axis suppression; blood sugar elevation that in diabetics may result in ketoacidosis or comma; water retention that in patients with history of congestive heart failure may result in shortness of breath, pulmonary edema, and decompensation with resultant heart failure; weight gain; swelling or edema; medication-induced neural toxicity; particulate matter embolism and blood vessel occlusion with resultant organ, and/or nervous system infarction; and/or aseptic necrosis of one or more joints. Finally, the patient was informed that Medicine is not an exact science; therefore, there is also the possibility of unforeseen or unpredictable risks and/or possible complications that may result in a catastrophic outcome. The patient indicated having understood very clearly. We have given the patient no guarantees and we have made no promises. Enough time was given to the patient to ask questions, all of which were answered to the patient's satisfaction. Ms. Clopper has indicated that she wanted to continue with the procedure. Attestation: I, the ordering provider, attest that I have discussed with the patient the benefits, risks, side-effects, alternatives, likelihood of achieving goals, and potential problems during recovery for the procedure that I have provided informed consent. Date  Time: 12/25/2017 10:30 AM  Pre-Procedure Preparation:  Monitoring: As per clinic protocol. Respiration, ETCO2, SpO2, BP, heart rate and rhythm monitor placed and checked for adequate function Safety Precautions: Patient was assessed for positional comfort and pressure points before starting the procedure. Time-out: I initiated and conducted the "Time-out" before starting the procedure, as per protocol. The patient was asked to participate by confirming the accuracy of the "Time Out" information. Verification of the correct person, site, and procedure were performed and confirmed by me, the nursing staff, and the patient. "Time-out"  conducted as per Joint Commission's Universal Protocol (UP.01.01.01). Time: 1155  Description of Procedure #1:   Position: Prone Target Area: Area medial to the occipital artery at the level of the superior nuchal ridge Approach: Posterior approach Area Prepped: Entire Posterior Occipital Region Prepping solution: Hibiclens (4.0% Chlorhexidine gluconate solution) Safety Precautions: Aspiration looking for blood return was conducted prior to all injections. At no point did we inject any substances, as a needle was being advanced. No attempts were made at seeking any paresthesias. Safe injection practices and needle disposal techniques used. Medications properly checked for expiration dates. SDV (single dose vial) medications used. Description of  the Procedure: Protocol guidelines were followed. The patient was placed in position over the procedure table. The target area was identified and the area prepped in the usual manner. The skin and muscle were infiltrated with local anesthetic. Appropriate amount of time allowed to pass for local anesthetics to take effect. Radiofrequency needles were introduced to the target area using fluoroscopic guidance. Using the NeuroTherm NT1100 Radiofrequency Generator, sensory stimulation using 50 Hz was used to locate & identify the nerve, making sure that the needle was positioned such that there was no sensory stimulation below 0.3 V or above 0.7 V. Stimulation using 2 Hz was used to evaluate the motor component. Care was taken not to lesion any nerves that demonstrated motor stimulation of the lower extremities at an output of less than 2.5 times that of the sensory threshold, or a maximum of 2.0 V. Once satisfactory placement of the needles was achieved, the numbing solution was slowly injected after negative aspiration. After waiting for at least 2 minutes, the ablation was performed at 80 degrees C for 60 seconds, using regular Radiofrequency settings. Once the  procedure was completed, the needles were then removed and the area cleansed, making sure to leave some of the prepping solution back to take advantage of its long term bactericidal properties. Intra-operative Compliance: Compliant   Start Time: 1155 hrs.  Materials & Medications:  Needle(s) Type: Teflon-coated, curved tip, Radiofrequency needle(s) Gauge: 22G Length: 10cm Medication(s): Please see orders for medications and dosing details.  Description of Procedure #2:  Laterality: Left Level: C3, C4, C5, C6, & C7 Medial Branch Level(s). Area Prepped: Entire Posterior Cervico-thoracic Region Prepping solution: ChloraPrep (2% chlorhexidine gluconate and 70% isopropyl alcohol) Safety Precautions: Aspiration looking for blood return was conducted prior to all injections. At no point did we inject any substances, as a needle was being advanced. Before injecting, the patient was told to immediately notify me if she was experiencing any new onset of "ringing in the ears, or metallic taste in the mouth". No attempts were made at seeking any paresthesias. Safe injection practices and needle disposal techniques used. Medications properly checked for expiration dates. SDV (single dose vial) medications used. After the completion of the procedure, all disposable equipment used was discarded in the proper designated medical waste containers. Local Anesthesia: Protocol guidelines were followed. The patient was positioned over the fluoroscopy table. The area was prepped in the usual manner. The time-out was completed. The target area was identified using fluoroscopy. A 12-in long, straight, sterile hemostat was used with fluoroscopic guidance to locate the targets for each level blocked. Once located, the skin was marked with an approved surgical skin marker. Once all sites were marked, the skin (epidermis, dermis, and hypodermis), as well as deeper tissues (fat, connective tissue and muscle) were infiltrated  with a small amount of a short-acting local anesthetic, loaded on a 10cc syringe with a 25G, 1.5-in  Needle. An appropriate amount of time was allowed for local anesthetics to take effect before proceeding to the next step. Local Anesthetic: Lidocaine 2.0% The unused portion of the local anesthetic was discarded in the proper designated containers. Technical explanation of process:  Radiofrequency Ablation (RFA) C2 Medial Branch Nerve RFA: The target area for the C2 dorsal medial articular branch is the lateral concave waist of the articular pillar of C2. Under fluoroscopic guidance, a Radiofrequency needle was inserted until contact was made with os over the postero-lateral aspect of the articular pillar of C2 (target area). Sensory and motor testing  was conducted to properly adjust the position of the needle. Once satisfactory placement of the needle was achieved, the numbing solution was slowly injected after negative aspiration for blood. 2.0 mL of the nerve block solution was injected without difficulty or complication. After waiting for at least 3 minutes, the ablation was performed. Once completed, the needle was removed intact. C3 Medial Branch Nerve RFA: The target area for the C3 dorsal medial articular branch is the lateral concave waist of the articular pillar of C3. Under fluoroscopic guidance, a Radiofrequency needle was inserted until contact was made with os over the postero-lateral aspect of the articular pillar of C3 (target area). Sensory and motor testing was conducted to properly adjust the position of the needle. Once satisfactory placement of the needle was achieved, the numbing solution was slowly injected after negative aspiration for blood. 2.0 mL of the nerve block solution was injected without difficulty or complication. After waiting for at least 3 minutes, the ablation was performed. Once completed, the needle was removed intact. Radiofrequency lesioning (ablation):   Radiofrequency Generator: NeuroTherm [id number] Sensory Stimulation Parameters: 50 Hz was used to locate & identify the nerve, making sure that the needle was positioned such that there was no sensory stimulation below 0.3 V or above 0.7 V. Motor Stimulation Parameters: 2 Hz was used to evaluate the motor component. Care was taken not to lesion any nerves that demonstrated motor stimulation of the lower extremities at an output of less than 2.5 times that of the sensory threshold, or a maximum of 2.0 V. Lesioning Technique Parameters: Standard Radiofrequency settings. (Not bipolar or pulsed.) Temperature Settings: 80 degrees C Lesioning time: 60 seconds Intra-operative Compliance: Compliant Materials & Medications: Needle(s) (Electrode/Cannula) Type: Teflon-coated, curved tip, Radiofrequency needle(s) Gauge: 22G Length: 10cm Numbing solution: 0.2% PF-Ropivacaine + Triamcinolone (40 mg/mL) diluted to a final concentration of 4 mg of Triamcinolone/mL of Ropivacaine The unused portion of the solution was discarded in the proper designated containers.  Once the entire procedure was completed, the treated area was cleaned, making sure to leave some of the prepping solution back to take advantage of its long term bactericidal properties.    Intra-operative Compliance: Compliant  Vitals:   12/25/17 1225 12/25/17 1233 12/25/17 1243 12/25/17 1253  BP: 120/84 118/76 123/71 114/71  Pulse:      Resp: 13 14 12 15   Temp:  98.3 F (36.8 C)    TempSrc:  Temporal    SpO2: 96% 98% 98% 96%  Weight:      Height:       End Time: 1225 hrs.  Imaging Guidance (Spinal):  Type of Imaging Technique: Fluoroscopy Guidance (Spinal) Indication(s): Assistance in needle guidance and placement for procedures requiring needle placement in or near specific anatomical locations not easily accessible without such assistance. Exposure Time: Please see nurses notes. Contrast: None used. Fluoroscopic Guidance: I was  personally present during the use of fluoroscopy. "Tunnel Vision Technique" used to obtain the best possible view of the target area. Parallax error corrected before commencing the procedure. "Direction-depth-direction" technique used to introduce the needle under continuous pulsed fluoroscopy. Once target was reached, antero-posterior, oblique, and lateral fluoroscopic projection used confirm needle placement in all planes. Images permanently stored in EMR. Interpretation: No contrast injected. I personally interpreted the imaging intraoperatively. Adequate needle placement confirmed in multiple planes. Permanent images saved into the patient's record.  Antibiotic Prophylaxis:   Anti-infectives (From admission, onward)   None     Indication(s): None identified  Post-operative Assessment:  Post-procedure Vital Signs:  Pulse Rate: 72 Temp: 98.3 F (36.8 C) Resp: 15 BP: 114/71 SpO2: 96 %  EBL: None  Complications: No immediate post-treatment complications observed by team, or reported by patient.  Note: The patient tolerated the entire procedure well. A repeat set of vitals were taken after the procedure and the patient was kept under observation following institutional policy, for this type of procedure. Post-procedural neurological assessment was performed, showing return to baseline, prior to discharge. The patient was provided with post-procedure discharge instructions, including a section on how to identify potential problems. Should any problems arise concerning this procedure, the patient was given instructions to immediately contact us, at any time, without hesitation. In any case, we plan to contact the patient by telephone for a follow-up status report regarding this interventional procedure.  Comments:  No additional relevant information.  Plan of Care   Imaging Orders     DG C-Arm 1-60 Min-No Report  Procedure Orders     Occipital Nerve Radiofrequency  Medications  ordered for procedure: Meds ordered this encounter  Medications  . ropivacaine (PF) 2 mg/mL (0.2%) (NAROPIN) injection 9 mL  . dexamethasone (DECADRON) injection 10 mg  . glycopyrrolate (ROBINUL) injection 0.2 mg  . lidocaine (XYLOCAINE) 2 % (with pres) injection 400 mg  . midazolam (VERSED) 5 MG/5ML injection 1-2 mg    Make sure Flumazenil is available in the pyxis when using this medication. If oversedation occurs, administer 0.2 mg IV over 15 sec. If after 45 sec no response, administer 0.2 mg again over 1 min; may repeat at 1 min intervals; not to exceed 4 doses (1 mg)  . fentaNYL (SUBLIMAZE) injection 25-50 mcg    Make sure Narcan is available in the pyxis when using this medication. In the event of respiratory depression (RR< 8/min): Titrate NARCAN (naloxone) in increments of 0.1 to 0.2 mg IV at 2-3 minute intervals, until desired degree of reversal.  . lactated ringers infusion 1,000 mL  . oxyCODONE-acetaminophen (PERCOCET) 5-325 MG tablet    Sig: Take 1 tablet by mouth every 8 (eight) hours as needed for up to 7 days for severe pain.    Dispense:  21 tablet    Refill:  0    For acute post-operative pain. Not to be refilled. To last 7 days.   Medications administered: We administered ropivacaine (PF) 2 mg/mL (0.2%), dexamethasone, lidocaine, midazolam, fentaNYL, and lactated ringers.  See the medical record for exact dosing, route, and time of administration.  New Prescriptions   OXYCODONE-ACETAMINOPHEN (PERCOCET) 5-325 MG TABLET    Take 1 tablet by mouth every 8 (eight) hours as needed for up to 7 days for severe pain.   Disposition: Discharge home  Discharge Date & Time: 12/25/2017; 1257 hrs.   Physician-requested Follow-up: Return for Post-RFA eval in 6 wks, w/ Dionisio David, NP.  Future Appointments  Date Time Provider [place]  01/01/2018  9:00 AM [facility]  01/05/2018  8:45 AM Vevelyn Francois, NP [facility] None  01/22/2018  9:30 AM Ursula Alert, MD  [facility] None   Primary Care Physician: Arnetha Courser, MD Location: [facility] Note by: Gaspar Cola, MD Date: 12/25/2017; Time: 2:47 PM  Disclaimer:  Medicine is not an Chief Strategy Officer. The only guarantee in medicine is that nothing is guaranteed. It is important to note that the decision to proceed with this intervention was based on the information collected from the patient. The Data and conclusions were drawn  from the patient's questionnaire, the interview, and the physical examination. Because the information was provided in large part by the patient, it cannot be guaranteed that it has not been purposely or unconsciously manipulated. Every effort has been made to obtain as much relevant data as possible for this evaluation. It is important to note that the conclusions that lead to this procedure are derived in large part from the available data. Always take into account that the treatment will also be dependent on availability of resources and existing treatment guidelines, considered by other Pain Management Practitioners as being common knowledge and practice, at the time of the intervention. For Medico-Legal purposes, it is also important to point out that variation in procedural techniques and pharmacological choices are the acceptable norm. The indications, contraindications, technique, and results of the above procedure should only be interpreted and judged by a Board-Certified Interventional Pain Specialist with extensive familiarity and expertise in the same exact procedure and technique.

## 2017-12-25 NOTE — Patient Instructions (Signed)

## 2017-12-26 ENCOUNTER — Telehealth: Payer: Self-pay

## 2017-12-26 NOTE — Telephone Encounter (Signed)
Post procedure phone call.  Left messge 

## 2018-01-01 ENCOUNTER — Encounter
Admission: RE | Admit: 2018-01-01 | Discharge: 2018-01-01 | Disposition: A | Discharge status: Home / Self Care | Payer: Medicaid Other | Source: Ambulatory Visit | Attending: Cardiovascular Disease | Admitting: Cardiovascular Disease

## 2018-01-01 DIAGNOSIS — R079 Chest pain, unspecified: Secondary | ICD-10-CM | POA: Insufficient documentation

## 2018-01-01 MED ORDER — REGADENOSON 0.4 MG/5ML IV SOLN: 0.4000 mg | Freq: Once | INTRAVENOUS | Status: DC

## 2018-01-01 MED ORDER — TECHNETIUM TC 99M TETROFOSMIN IV KIT
30.6700 | PACK | Freq: Once | INTRAVENOUS | Status: AC | PRN
  Administered 2018-01-01: 30.67 via INTRAVENOUS

## 2018-01-01 MED ORDER — TECHNETIUM TC 99M TETROFOSMIN IV KIT
10.0000 | PACK | Freq: Once | INTRAVENOUS | Status: AC | PRN
  Administered 2018-01-01: 12.65 via INTRAVENOUS

## 2018-01-02 LAB — NM MYOCAR MULTI W/SPECT W/WALL MOTION / EF
CHL CUP MPHR: 174 {beats}/min
CHL CUP RESTING HR STRESS: 74 {beats}/min
CSEPEDS: 11 s
CSEPEW: 4.6 METS
CSEPPHR: 166 {beats}/min
Exercise duration (min): 3 min
LV dias vol: 60 mL (ref 46–106)
LVSYSVOL: 22 mL
NUC STRESS TID: 0.8
Percent HR: 95 %
SDS: 0
SRS: 4
SSS: 0

## 2018-01-03 ENCOUNTER — Other Ambulatory Visit: Payer: Self-pay | Admitting: Family Medicine

## 2018-01-05 ENCOUNTER — Other Ambulatory Visit: Payer: Self-pay

## 2018-01-05 ENCOUNTER — Ambulatory Visit: Payer: Medicaid Other | Attending: Nurse Practitioner | Admitting: Nurse Practitioner

## 2018-01-05 ENCOUNTER — Encounter: Payer: Self-pay | Admitting: Nurse Practitioner

## 2018-01-05 VITALS — BP 112/72 | HR 66 | Temp 98.4°F | Resp 16 | Ht 63.0 in | Wt 211.0 lb

## 2018-01-05 DIAGNOSIS — G894 Chronic pain syndrome: Secondary | ICD-10-CM | POA: Insufficient documentation

## 2018-01-05 DIAGNOSIS — Z885 Allergy status to narcotic agent status: Secondary | ICD-10-CM | POA: Insufficient documentation

## 2018-01-05 DIAGNOSIS — Z5181 Encounter for therapeutic drug level monitoring: Secondary | ICD-10-CM | POA: Insufficient documentation

## 2018-01-05 DIAGNOSIS — E559 Vitamin D deficiency, unspecified: Secondary | ICD-10-CM | POA: Insufficient documentation

## 2018-01-05 DIAGNOSIS — Z888 Allergy status to other drugs, medicaments and biological substances status: Secondary | ICD-10-CM | POA: Insufficient documentation

## 2018-01-05 DIAGNOSIS — E781 Pure hyperglyceridemia: Secondary | ICD-10-CM | POA: Diagnosis not present

## 2018-01-05 DIAGNOSIS — M25552 Pain in left hip: Secondary | ICD-10-CM | POA: Diagnosis not present

## 2018-01-05 DIAGNOSIS — M5481 Occipital neuralgia: Secondary | ICD-10-CM | POA: Insufficient documentation

## 2018-01-05 DIAGNOSIS — M25572 Pain in left ankle and joints of left foot: Secondary | ICD-10-CM | POA: Insufficient documentation

## 2018-01-05 DIAGNOSIS — M4712 Other spondylosis with myelopathy, cervical region: Secondary | ICD-10-CM | POA: Insufficient documentation

## 2018-01-05 DIAGNOSIS — M5414 Radiculopathy, thoracic region: Secondary | ICD-10-CM | POA: Diagnosis not present

## 2018-01-05 DIAGNOSIS — R51 Headache: Secondary | ICD-10-CM | POA: Diagnosis not present

## 2018-01-05 DIAGNOSIS — M797 Fibromyalgia: Secondary | ICD-10-CM | POA: Diagnosis not present

## 2018-01-05 DIAGNOSIS — Z79899 Other long term (current) drug therapy: Secondary | ICD-10-CM | POA: Insufficient documentation

## 2018-01-05 DIAGNOSIS — Z880 Allergy status to penicillin: Secondary | ICD-10-CM | POA: Insufficient documentation

## 2018-01-05 DIAGNOSIS — Z91013 Allergy to seafood: Secondary | ICD-10-CM | POA: Insufficient documentation

## 2018-01-05 DIAGNOSIS — R1031 Right lower quadrant pain: Secondary | ICD-10-CM | POA: Diagnosis not present

## 2018-01-05 DIAGNOSIS — F411 Generalized anxiety disorder: Secondary | ICD-10-CM | POA: Insufficient documentation

## 2018-01-05 DIAGNOSIS — K219 Gastro-esophageal reflux disease without esophagitis: Secondary | ICD-10-CM | POA: Diagnosis not present

## 2018-01-05 DIAGNOSIS — Z79891 Long term (current) use of opiate analgesic: Secondary | ICD-10-CM | POA: Diagnosis not present

## 2018-01-05 DIAGNOSIS — Z881 Allergy status to other antibiotic agents status: Secondary | ICD-10-CM | POA: Insufficient documentation

## 2018-01-05 DIAGNOSIS — M5412 Radiculopathy, cervical region: Secondary | ICD-10-CM | POA: Diagnosis not present

## 2018-01-05 DIAGNOSIS — K589 Irritable bowel syndrome without diarrhea: Secondary | ICD-10-CM | POA: Diagnosis not present

## 2018-01-05 DIAGNOSIS — I471 Supraventricular tachycardia: Secondary | ICD-10-CM | POA: Diagnosis not present

## 2018-01-05 DIAGNOSIS — M5137 Other intervertebral disc degeneration, lumbosacral region: Secondary | ICD-10-CM | POA: Diagnosis not present

## 2018-01-05 DIAGNOSIS — G4700 Insomnia, unspecified: Secondary | ICD-10-CM | POA: Insufficient documentation

## 2018-01-05 DIAGNOSIS — Z87891 Personal history of nicotine dependence: Secondary | ICD-10-CM | POA: Insufficient documentation

## 2018-01-05 DIAGNOSIS — M25562 Pain in left knee: Secondary | ICD-10-CM | POA: Insufficient documentation

## 2018-01-05 DIAGNOSIS — Z6837 Body mass index (BMI) 37.0-37.9, adult: Secondary | ICD-10-CM | POA: Insufficient documentation

## 2018-01-05 DIAGNOSIS — E669 Obesity, unspecified: Secondary | ICD-10-CM | POA: Insufficient documentation

## 2018-01-05 DIAGNOSIS — Z882 Allergy status to sulfonamides status: Secondary | ICD-10-CM | POA: Insufficient documentation

## 2018-01-05 DIAGNOSIS — M542 Cervicalgia: Secondary | ICD-10-CM

## 2018-01-05 DIAGNOSIS — M25571 Pain in right ankle and joints of right foot: Secondary | ICD-10-CM | POA: Diagnosis not present

## 2018-01-05 DIAGNOSIS — M25561 Pain in right knee: Secondary | ICD-10-CM | POA: Insufficient documentation

## 2018-01-05 DIAGNOSIS — Z886 Allergy status to analgesic agent status: Secondary | ICD-10-CM | POA: Insufficient documentation

## 2018-01-05 DIAGNOSIS — G4733 Obstructive sleep apnea (adult) (pediatric): Secondary | ICD-10-CM | POA: Diagnosis not present

## 2018-01-05 DIAGNOSIS — G8929 Other chronic pain: Secondary | ICD-10-CM

## 2018-01-05 DIAGNOSIS — G4486 Cervicogenic headache: Secondary | ICD-10-CM

## 2018-01-05 DIAGNOSIS — I10 Essential (primary) hypertension: Secondary | ICD-10-CM | POA: Insufficient documentation

## 2018-01-05 MED ORDER — PREGABALIN 150 MG PO CAPS: 150.0000 mg | ORAL_CAPSULE | Freq: Three times a day (TID) | ORAL | 2 refills | Status: DC

## 2018-01-05 NOTE — Progress Notes (Signed)
Nursing Pain Medication Assessment:  Safety precautions to be maintained throughout the outpatient stay will include: orient to surroundings, keep bed in low position, maintain call bell within reach at all times, provide assistance with transfer out of bed and ambulation.  Medication Inspection Compliance: Pill count conducted under aseptic conditions, in front of the patient. Neither the pills nor the bottle was removed from the patient's sight at any time. Once count was completed pills were immediately returned to the patient in their original bottle.  Medication: Hydrocodone/APAP Pill/Patch Count: 24 of 30 pills remain Pill/Patch Appearance: Markings consistent with prescribed medication Bottle Appearance: Standard pharmacy container. Clearly labeled. Filled Date:03/ 19/ 2019 Last Medication intake:  a week ago

## 2018-01-05 NOTE — Progress Notes (Signed)
Patient's Name: Dana Bradley  MRN: [id number]  Referring Provider: Arnetha Courser, MD  DOB: 04/17/1971  PCP: Arnetha Courser, MD  DOS: 01/05/2018  Note by: Vevelyn Francois NP  Service setting: Ambulatory outpatient  Specialty: Interventional Pain Management  Location: [facility] (AMB) Pain Management Facility    Patient type: Established    Primary Reason(s) for Visit: Encounter for prescription drug management & post-procedure evaluation of chronic illness with mild to moderate exacerbation(Level of risk: moderate) CC: Headache  HPI  Dana Bradley is a 47 y.o. year old, female patient, who comes today for a post-procedure evaluation and medication management. She has Depression, major, recurrent, in remission ([place]); Hypertension, benign essential, goal below 140/90; History of PSVT (paroxysmal supraventricular tachycardia); GERD; Chest pain; Obstructive sleep apnea, adult; Menorrhagia; Cervico-occipital neuralgia; DDD (degenerative disc disease), lumbosacral; Paroxysmal supraventricular tachycardia ([place]); Exertional shortness of breath; Chronic pain of multiple joints; Chronic superficial gastritis; DDD (degenerative disc disease), lumbar; Fibromyalgia; Insomnia; Migraine without aura and with status migrainosus, not intractable; Chronic lower extremity cramps (Bilateral) (R>L); Obesity; GAD (generalized anxiety disorder); Fatigue; Atypical lymphocytosis; Vitamin D insufficiency; Panic disorder with agoraphobia; Depression, unspecified depression type; Chronic pain syndrome; Long term prescription opiate use; Opiate use; Long term prescription benzodiazepine use; Neurogenic pain; Chronic low back pain (Primary Source of Pain) (Bilateral) (R>L) (midline); Chronic upper back pain (Secondary source of pain) (Bilateral) (L>R); Chronic abdominal pain (Right lower quadrant); Thoracic radiculitis (Right) (T11 dermatome); Chronic occipital neuralgia [facility] source of pain) (Bilateral) (L>R); Chronic neck pain; Chronic  cervical radicular pain (Bilateral) (L>R); Chronic shoulder blade pain (Bilateral) (L>R); Chronic upper extremity pain (Bilateral) (R>L); Chronic knee pain (Bilateral) (R>L); Chronic ankle pain (Bilateral); Cervical spondylosis with myelopathy and radiculopathy; Nephrolithiasis; Controlled substance agreement signed; Plantar fasciitis of left foot; Vitamin B12 deficiency; Hyperlipidemia; Medication monitoring encounter; Cervicogenic headache; Bilateral leg edema; Bright red rectal bleeding; History of vasovagal episode; Hypertriglyceridemia; Chronic left hip pain; Chronic sacroiliac joint pain (Left); Lumbar facet joint syndrome (B) (L>R); Left lumbar radiculitis; Lumbar spondylosis; Migraine headache; Muscle spasticity; Osteoarthritis of shoulder (B); Lumbar L1-2 disc protrusion (Right); Acute postoperative pain; Myofascial pain syndrome (Left) (trapezius muscle); Trigger point of shoulder region (Left); Low serum cortisol level ([place]); Chronic fatigue syndrome with fibromyalgia; and Occipital headache on their problem list. Her primarily concern today is the Headache  Pain Assessment: Location: Anterior, Posterior Head Radiating: radiates down neck and left shoulder Onset: More than a month ago Duration: Chronic pain Quality: Crushing Severity: 1 /10 (self-reported pain score)  Note: Reported level is compatible with observation.                          Effect on ADL: has trouble with self care, ADL Timing: Constant Modifying factors: denies  Dana Bradley was last seen on 10/23/2017 for a procedure. During today's appointment we reviewed Dana Bradley's post-procedure results, as well as her outpatient medication regimen. She admits that she feels like a headache maybe coming on . She has only had one since the RFA, She admits that last month she had 22 days of headaches.  Further details on both, my assessment(s), as well as the proposed treatment plan, please see below.  Controlled Substance  Pharmacotherapy Assessment REMS (Risk Evaluation and Mitigation Strategy)  Analgesic:Hydrocodone/APAP 7.5/325 one daily MME/day:7.'5mg'$ /day.     Dewayne Shorter, RN  01/05/2018  8:53 AM  Signed Nursing Pain Medication Assessment:  Safety precautions to be maintained throughout the outpatient stay will include: orient  to surroundings, keep bed in low position, maintain call bell within reach at all times, provide assistance with transfer out of bed and ambulation.  Medication Inspection Compliance: Pill count conducted under aseptic conditions, in front of the patient. Neither the pills nor the bottle was removed from the patient's sight at any time. Once count was completed pills were immediately returned to the patient in their original bottle.  Medication: Hydrocodone/APAP Pill/Patch Count: 24 of 30 pills remain Pill/Patch Appearance: Markings consistent with prescribed medication Bottle Appearance: Standard pharmacy container. Clearly labeled. Filled Date:03/ 19/ 2019 Last Medication intake:  a week ago   Pharmacokinetics: Liberation and absorption (onset of action): WNL Distribution (time to peak effect): WNL Metabolism and excretion (duration of action): WNL         Pharmacodynamics: Desired effects: Analgesia: Dana Bradley reports >50% benefit. Functional ability: Patient reports that medication allows her to accomplish basic ADLs Clinically meaningful improvement in function (CMIF): Sustained CMIF goals met Perceived effectiveness: Described as relatively effective, allowing for increase in activities of daily living (ADL) Undesirable effects: Side-effects or Adverse reactions: None reported Monitoring: [place] PMP: Online review of the past 36-monthperiod conducted. Compliant with practice rules and regulations Last UDS on record: Summary  Date Value Ref Range Status  04/01/2017 FINAL  Final    Comment:     ==================================================================== TOXASSURE SELECT 13 (MW) ==================================================================== Test                             Result       Flag       Units Drug Present and Declared for Prescription Verification   Hydrocodone                    366          EXPECTED   ng/mg creat   Hydromorphone                  121          EXPECTED   ng/mg creat   Dihydrocodeine                 34           EXPECTED   ng/mg creat   Norhydrocodone                 295          EXPECTED   ng/mg creat    Sources of hydrocodone include scheduled prescription    medications. Hydromorphone, dihydrocodeine and norhydrocodone are    expected metabolites of hydrocodone. Hydromorphone and    dihydrocodeine are also available as scheduled prescription    medications. Drug Absent but Declared for Prescription Verification   Alprazolam                     Not Detected UNEXPECTED ng/mg creat ==================================================================== Test                      Result    Flag   Units      Ref Range   Creatinine              174              mg/dL      >=20 ==================================================================== Declared Medications:  The flagging and interpretation on this report are based on the  following declared medications.  Unexpected  results may arise from  inaccuracies in the declared medications.  **Note: The testing scope of this panel includes these medications:  Alprazolam (Xanax)  Hydrocodone (Norco)  **Note: The testing scope of this panel does not include following  reported medications:  Acetaminophen (Norco)  Amitriptyline  Atorvastatin  Baclofen (Lioresal)  Calcium  Cyanocobalamin  Diclofenac  Lamotrigine  Magnesium  Metoprolol (Lopressor)  Omega-3 Fatty Acids (Lovaza)  Pregabalin (Lyrica)  Vitamin D  Vitamin  K ==================================================================== For clinical consultation, please call [phone]. ====================================================================    UDS interpretation: Compliant          Medication Assessment Form: Reviewed. Patient indicates being compliant with therapy Treatment compliance: Compliant Risk Assessment Profile: Aberrant behavior: See prior evaluations. None observed or detected today Comorbid factors increasing risk of overdose: See prior notes. No additional risks detected today Risk of substance use disorder (SUD): Low Opioid Risk Tool - 01/05/18 0850      Family History of Substance Abuse   Alcohol  Positive Female    Illegal Drugs  Negative    Rx Drugs  Negative      Personal History of Substance Abuse   Alcohol  Negative    Illegal Drugs  Negative    Rx Drugs  Negative      Age   Age between 59-45 years   No      Psychological Disease   Psychological Disease  Negative    Depression  Positive      Total Score   Opioid Risk Tool Scoring  2    Opioid Risk Interpretation  Low Risk      ORT Scoring interpretation table:  Score <3 = Low Risk for SUD  Score between 4-7 = Moderate Risk for SUD  Score >8 = High Risk for Opioid Abuse   Risk Mitigation Strategies:  Patient Counseling: Covered Patient-Prescriber Agreement (PPA): Present and active  Notification to other healthcare providers: Done  Pharmacologic Plan: No change in therapy, at this time.             Post-Procedure Assessment  12/25/2017 Procedure: diagnostic Occipital Nerve Radiofrequency Ablation  and Left Paraspinal Cervical C2 Medial Branch + Third Occipital Nerve Pre-procedure pain score:  3/10 Post-procedure pain score: 2/10         Influential Factors: BMI: 37.38 kg/m Intra-procedural challenges: None observed.         Assessment challenges: None detected.              Reported side-effects: None.        Post-procedural adverse  reactions or complications: None reported         Sedation: Please see nurses note. When no sedatives are used, the analgesic levels obtained are directly associated to the effectiveness of the local anesthetics. However, when sedation is provided, the level of analgesia obtained during the initial 1 hour following the intervention, is believed to be the result of a combination of factors. These factors may include, but are not limited to: 1. The effectiveness of the local anesthetics used. 2. The effects of the analgesic(s) and/or anxiolytic(s) used. 3. The degree of discomfort experienced by the patient at the time of the procedure. 4. The patients ability and reliability in recalling and recording the events. 5. The presence and influence of possible secondary gains and/or psychosocial factors. Reported result: Relief experienced during the 1st hour after the procedure: 100 % (Ultra-Short Term Relief)            Interpretative annotation: Clinically  appropriate result. Analgesia during this period is likely to be Local Anesthetic and/or IV Sedative (Analgesic/Anxiolytic) related.          Effects of local anesthetic: The analgesic effects attained during this period are directly associated to the localized infiltration of local anesthetics and therefore cary significant diagnostic value as to the etiological location, or anatomical origin, of the pain. Expected duration of relief is directly dependent on the pharmacodynamics of the local anesthetic used. Long-acting (4-6 hours) anesthetics used.  Reported result: Relief during the next 4 to 6 hour after the procedure: 100 % (Short-Term Relief)            Interpretative annotation: Clinically appropriate result. Analgesia during this period is likely to be Local Anesthetic-related.          Long term relief to be evaluated at later date  Laboratory Chemistry  Inflammation Markers (CRP: Acute Phase) (ESR: Chronic Phase) Lab Results  Component  Value Date   CRP 4.5 05/02/2017   ESRSEDRATE 11 04/01/2017                         Rheumatology Markers Lab Results  Component Value Date   RF <14 12/15/2017   ANA NEGATIVE 12/15/2017                        Renal Function Markers Lab Results  Component Value Date   BUN 13 11/24/2017   CREATININE 0.80 11/24/2017   GFRAA 102 11/24/2017   GFRNONAA 88 11/24/2017                              Hepatic Function Markers Lab Results  Component Value Date   AST 14 11/24/2017   ALT 21 11/24/2017   ALBUMIN 4.1 04/01/2017   ALKPHOS 84 04/01/2017                        Electrolytes Lab Results  Component Value Date   NA 141 11/24/2017   K 4.2 11/24/2017   CL 105 11/24/2017   CALCIUM 9.8 11/24/2017   MG 2.1 04/01/2017                        Neuropathy Markers Lab Results  Component Value Date   VITAMINB12 638 11/24/2017   HIV NON REAC 01/12/2009                        Bone Pathology Markers Lab Results  Component Value Date   VD25OH 17 (L) 11/24/2017   25OHVITD1 20 (L) 04/01/2017   25OHVITD2 5.4 04/01/2017   25OHVITD3 15 04/01/2017                         Coagulation Parameters Lab Results  Component Value Date   INR 1.01 04/23/2010   LABPROT 13.2 04/23/2010   PLT 299 11/24/2017   DDIMER 0.57 (H) 05/02/2017                        Cardiovascular Markers Lab Results  Component Value Date   BNP 14.2 05/02/2017   CKTOTAL 40 12/15/2017   TROPONINI <0.03 05/02/2017   HGB 13.9 11/24/2017   HCT 39.2 11/24/2017  CA Markers No results found for: CEA, CA125, LABCA2                      Note: Lab results reviewed.  Recent Diagnostic Imaging Results  NM Myocar Multi W/Spect W/Wall Motion / EF  Blood pressure demonstrated a normal response to exercise.  There was no ST segment deviation noted during stress.  No T wave inversion was noted during stress.  The study is normal.  This is a low risk study.  The left ventricular ejection  fraction is normal (55-65%).    Complexity Note: Imaging results reviewed. Results shared with Dana Bradley, using Layman's terms.                         Meds   Current Outpatient Medications:  .  acetaminophen (TYLENOL) 325 MG tablet, Take 650 mg by mouth as needed. , Disp: , Rfl:  .  baclofen (LIORESAL) 10 MG tablet, Take 1 tablet (10 mg total) 3 (three) times daily by mouth., Disp: 90 tablet, Rfl: 2 .  Calcium-Vitamin D-Vitamin K 284-132-44 MG-UNT-MCG TABS, Take 1 tablet by mouth daily. , Disp: , Rfl:  .  clonazePAM (KLONOPIN) 0.5 MG tablet, Take 1 tablet (0.5 mg total) by mouth 2 (two) times daily as needed for anxiety (only for severe panic sx)., Disp: 60 tablet, Rfl: 0 .  Cyanocobalamin (VITAMIN B-12) 500 MCG SUBL, Place 3 tablets (1,500 mcg total) under the tongue daily., Disp: 150 tablet, Rfl:  .  diclofenac sodium (VOLTAREN) 1 % GEL, Apply 2 g topically 4 (four) times daily. (Patient taking differently: Apply 2 g topically as needed. ), Disp: 1 Tube, Rfl: 2 .  HYDROcodone-acetaminophen (NORCO) 7.5-325 MG tablet, Take 1 tablet by mouth every 6 (six) hours as needed for severe pain., Disp: 30 tablet, Rfl: 0 .  metoprolol tartrate (LOPRESSOR) 50 MG tablet, TAKE 1 TABLET BY MOUTH TWICE A DAY, Disp: 180 tablet, Rfl: 3 .  omeprazole (PRILOSEC) 20 MG capsule, Take 1 capsule (20 mg total) by mouth daily., Disp: 30 capsule, Rfl: 0 .  ondansetron (ZOFRAN) 4 MG tablet, Take 1 tablet (4 mg total) every 8 (eight) hours as needed by mouth., Disp: 20 tablet, Rfl: 2 .  OXcarbazepine (TRILEPTAL) 150 MG tablet, Take 1 tablet (150 mg total) by mouth 2 (two) times daily., Disp: 60 tablet, Rfl: 2 .  pregabalin (LYRICA) 150 MG capsule, Take 1 capsule (150 mg total) by mouth every 8 (eight) hours., Disp: 90 capsule, Rfl: 2 .  rosuvastatin (CRESTOR) 10 MG tablet, Take 1 tablet (10 mg total) by mouth daily., Disp: 90 tablet, Rfl: 3 .  venlafaxine XR (EFFEXOR XR) 75 MG 24 hr capsule, Take 1 capsule (75 mg total)  by mouth daily with breakfast., Disp: 30 capsule, Rfl: 2 .  Vitamin D, Ergocalciferol, (DRISDOL) 50000 units CAPS capsule, Take 1 capsule (50,000 Units total) by mouth every 7 (seven) days., Disp: 4 capsule, Rfl: 1  ROS  Constitutional: Denies any fever or chills Gastrointestinal: No reported hemesis, hematochezia, vomiting, or acute GI distress Musculoskeletal: Denies any acute onset joint swelling, redness, loss of ROM, or weakness Neurological: No reported episodes of acute onset apraxia, aphasia, dysarthria, agnosia, amnesia, paralysis, loss of coordination, or loss of consciousness  Allergies  Dana Bradley is allergic to aspirin; cymbalta [duloxetine hcl]; depakote [divalproex sodium]; duloxetine; haloperidol; iodinated diagnostic agents; iodine; meperidine; reglan [metoclopramide]; tramadol hcl; trazodone; compazine [prochlorperazine]; meloxicam; penicillins; tomato; other; shellfish allergy; shellfish-derived  products; bacitracin-neomycin-polymyxin; cephalosporins; ibuprofen; latex; neosporin [neomycin-bacitracin zn-polymyx]; nsaids; sulfa antibiotics; sulfasalazine; and sulfonamide derivatives.  PFSH  Drug: Dana Bradley  reports that she does not use drugs. Alcohol:  reports that she does not drink alcohol. Tobacco:  reports that she quit smoking about 25 years ago. Her smoking use included cigarettes. She has a 12.00 pack-year smoking history. She has never used smokeless tobacco. Medical:  has a past medical history of Acute postoperative pain (04/07/2017), Anxiety, Bursitis, Chronic fatigue (12/12/2017), Edema leg (05/02/2015), Fibromyalgia, GERD (gastroesophageal reflux disease), IBS (irritable bowel syndrome), Knee pain, bilateral (12/21/2008), Lumbar discitis, Osteoarthritis, Right hand pain (04/10/2015), Sleep apnea, SVT (supraventricular tachycardia) ([place]), Vertigo, and Vitamin D deficiency (05/01/2016). Surgical: Dana Bradley  has a past surgical history that includes Ablation; Tubal ligation  (10/01/99); Cardiac catheterization; Knee arthroscopy; Colonoscopy with propofol (N/A, 05/17/2015); Esophagogastroduodenoscopy (N/A, 05/17/2015); and spg. Family: family history includes Alcohol abuse in her father; Alzheimer's disease in her paternal grandmother and unknown relative; Aneurysm in her maternal grandfather; Anxiety disorder in her father, mother, sister, and sister; Arthritis in her maternal grandmother; Bipolar disorder in her sister; COPD in her paternal grandfather; Cancer in her mother; Cancer (age of onset: 72) in her maternal grandmother; Depression in her father, mother, sister, and sister; Diabetes in her sister; Heart attack in her paternal grandfather; Heart disease in her father, maternal grandfather, and paternal grandfather; Hyperlipidemia in her maternal grandmother, mother, and sister; Hypertension in her father, maternal grandfather, mother, paternal grandfather, sister, and sister; Polycystic ovary syndrome in her sister; Stroke in her father; Thyroid disease in her maternal grandmother.  Constitutional Exam  General appearance: Well nourished, well developed, and well hydrated. In no apparent acute distress Vitals:   01/05/18 0841  BP: 112/72  Pulse: 66  Resp: 16  Temp: 98.4 F (36.9 C)  SpO2: 98%  Weight: 211 lb (95.7 kg)  Height: '5\' 3"'$  (1.6 m)  Psych/Mental status: Alert, oriented x 3 (person, place, & time)       Eyes: PERLA Respiratory: No evidence of acute respiratory distress  Cervical Spine Area Exam  Skin & Axial Inspection: No masses, redness, edema, swelling, or associated skin lesions Alignment: Symmetrical Functional ROM: Unrestricted ROM      Stability: No instability detected Muscle Tone/Strength: Functionally intact. No obvious neuro-muscular anomalies detected. Sensory (Neurological): Unimpaired Palpation: Uncomfortable              Upper Extremity (UE) Exam    Side: Right upper extremity  Side: Left upper extremity  Skin & Extremity  Inspection: Skin color, temperature, and hair growth are WNL. No peripheral edema or cyanosis. No masses, redness, swelling, asymmetry, or associated skin lesions. No contractures.  Skin & Extremity Inspection: Skin color, temperature, and hair growth are WNL. No peripheral edema or cyanosis. No masses, redness, swelling, asymmetry, or associated skin lesions. No contractures.  Functional ROM: Unrestricted ROM          Functional ROM: Unrestricted ROM          Muscle Tone/Strength: Functionally intact. No obvious neuro-muscular anomalies detected.  Muscle Tone/Strength: Functionally intact. No obvious neuro-muscular anomalies detected.  Sensory (Neurological): Unimpaired          Sensory (Neurological): Unimpaired          Palpation: No palpable anomalies              Palpation: No palpable anomalies              Specialized Test(s): Deferred  Specialized Test(s): Deferred          Assessment  Primary Diagnosis & Pertinent Problem List: The primary encounter diagnosis was Chronic neck pain. Diagnoses of Cervicogenic headache, Long term prescription opiate use, Cervico-occipital neuralgia, and Fibromyalgia were also pertinent to this visit.  Status Diagnosis  Controlled Controlled Controlled 1. Chronic neck pain   2. Cervicogenic headache   3. Long term prescription opiate use   4. Cervico-occipital neuralgia   5. Fibromyalgia     Problems updated and reviewed during this visit: No problems updated. Plan of Care  Pharmacotherapy (Medications Ordered): Meds ordered this encounter  Medications  . pregabalin (LYRICA) 150 MG capsule    Sig: Take 1 capsule (150 mg total) by mouth every 8 (eight) hours.    Dispense:  90 capsule    Refill:  2    Do not add this medication to the electronic "Automatic Refill" notification system. Patient may have prescription filled one day early if pharmacy is closed on scheduled refill date. 30 days    Order Specific Question:   Supervising Provider     Answer:   Milinda Pointer [[id number]]   New Prescriptions   No medications on file   Medications administered today: Emerson Barretto had no medications administered during this visit. Lab-work, procedure(s), and/or referral(s): No orders of the defined types were placed in this encounter.  Imaging and/or referral(s): None   Interventional management options: Planned, scheduled, and/or pending: Not at this time   Considering: Therapeutic left cervical epidural steroid injection Palliative left L4-5 lumbar epidural steroid injections Diagnostic bilateral lumbar facet block Possible bilateral lumbar facet radiofrequency ablation. Diagnostic thoracic facet block Possible bilateral thoracic facet radiofrequency ablation. Diagnostic bilateral cervical facet blocks Possible bilateral cervical facet radiofrequency ablation. Diagnostic bilateral lesser occipital nerve blocks Diagnostic bilateral C2 and TON nerve block Possible bilateral occipital nerve radiofrequency ablation. Diagnostic right T10-11 thoracic epidural steroid injection Diagnostic bilateral suprascapular nerve block Diagnostic bilateral intra-articular knee joint injection Possible bilateral intra-articular Hyalgan knee injections.  Diagnostic bilateral Genicular nerve block Possible bilateral Genicular nerve radiofrequency ablation.   Palliative PRN treatment(s): Palliative left trapezius muscle trigger point injection Palliative left L4-5 lumbar epidural steroid injections Diagnostic bilateral lumbar facet block Diagnostic thoracic facet block PalliativeLeft cervical epidural steroid injections Diagnostic bilateral cervical facet blocks Diagnostic bilateral lesser occipital nerve blocks Diagnostic bilateral C2 and TON nerve block Diagnostic right T10-11 thoracic epidural steroid injection Diagnostic bilateral suprascapular nerve block Diagnostic bilateral intra-articular knee joint  injection Diagnostic bilateral Genicular nerve block      Provider-requested follow-up: Return in about 3 months (around 04/06/2018) for MedMgmt with Me Dionisio David).  Future Appointments  Date Time Provider [place]  01/22/2018  9:30 AM Ursula Alert, MD [facility] None  04/06/2018  9:45 AM Vevelyn Francois, NP [facility] None   Primary Care Physician: Arnetha Courser, MD Location: [facility] Note by: Vevelyn Francois NP Date: 01/05/2018; Time: 2:35 PM  Pain Score Disclaimer: We use the NRS-11 scale. This is a self-reported, subjective measurement of pain severity with only modest accuracy. It is used primarily to identify changes within a particular patient. It must be understood that outpatient pain scales are significantly less accurate that those used for research, where they can be applied under ideal controlled circumstances with minimal exposure to variables. In reality, the score is likely to be a combination of pain intensity and pain affect, where pain affect describes the degree of emotional arousal or changes in action  readiness caused by the sensory experience of pain. Factors such as social and work situation, setting, emotional state, anxiety levels, expectation, and prior pain experience may influence pain perception and show large inter-individual differences that may also be affected by time variables.  Patient instructions provided during this appointment: Patient Instructions  ____________________________________________________________________________________________  Medication Rules  Applies to: All patients receiving prescriptions (written or electronic).  Pharmacy of record: Pharmacy where electronic prescriptions will be sent. If written prescriptions are taken to a different pharmacy, please inform the nursing staff. The pharmacy listed in the electronic medical record should be the one where you would like electronic  prescriptions to be sent.  Prescription refills: Only during scheduled appointments. Applies to both, written and electronic prescriptions.  NOTE: The following applies primarily to controlled substances (Opioid* Pain Medications).   Patient's responsibilities: 1. Pain Pills: Bring all pain pills to every appointment (except for procedure appointments). 2. Pill Bottles: Bring pills in original pharmacy bottle. Always bring newest bottle. Bring bottle, even if empty. 3. Medication refills: You are responsible for knowing and keeping track of what medications you need refilled. The day before your appointment, write a list of all prescriptions that need to be refilled. Bring that list to your appointment and give it to the admitting nurse. Prescriptions will be written only during appointments. If you forget a medication, it will not be "Called in", "Faxed", or "electronically sent". You will need to get another appointment to get these prescribed. 4. Prescription Accuracy: You are responsible for carefully inspecting your prescriptions before leaving our office. Have the discharge nurse carefully go over each prescription with you, before taking them home. Make sure that your name is accurately spelled, that your address is correct. Check the name and dose of your medication to make sure it is accurate. Check the number of pills, and the written instructions to make sure they are clear and accurate. Make sure that you are given enough medication to last until your next medication refill appointment. 5. Taking Medication: Take medication as prescribed. Never take more pills than instructed. Never take medication more frequently than prescribed. Taking less pills or less frequently is permitted and encouraged, when it comes to controlled substances (written prescriptions).  6. Inform other Doctors: Always inform, all of your healthcare providers, of all the medications you take. 7. Pain Medication from  other Providers: You are not allowed to accept any additional pain medication from any other Doctor or Healthcare provider. There are two exceptions to this rule. (see below) In the event that you require additional pain medication, you are responsible for notifying us, as stated below. 8. Medication Agreement: You are responsible for carefully reading and following our Medication Agreement. This must be signed before receiving any prescriptions from our practice. Safely store a copy of your signed Agreement. Violations to the Agreement will result in no further prescriptions. (Additional copies of our Medication Agreement are available upon request.) 9. Laws, Rules, & Regulations: All patients are expected to follow all Federal and [employer], [employer], Rules, [employer]. Ignorance of the Laws does not constitute a valid excuse. The use of any illegal substances is prohibited. 10. Adopted CDC guidelines & recommendations: Target dosing levels will be at or below 60 MME/day. Use of benzodiazepines** is not recommended.  Exceptions: There are only two exceptions to the rule of not receiving pain medications from other Healthcare Providers. 1. Exception #1 (Emergencies): In the event of an emergency (i.e.: accident requiring emergency care), you are  allowed to receive additional pain medication. However, you are responsible for: As soon as you are able, call our office [phone], at any time of the day or night, and leave a message stating your name, the date and nature of the emergency, and the name and dose of the medication prescribed. In the event that your call is answered by a member of our staff, make sure to document and save the date, time, and the name of the person that took your information.  2. Exception #2 (Planned Surgery): In the event that you are scheduled by another doctor or dentist to have any type of surgery or procedure, you are allowed (for a period no longer than 30 days), to  receive additional pain medication, for the acute post-op pain. However, in this case, you are responsible for picking up a copy of our "Post-op Pain Management for Surgeons" handout, and giving it to your surgeon or dentist. This document is available at our office, and does not require an appointment to obtain it. Simply go to our office during business hours (Monday-Thursday from 8:00 AM to 4:00 PM) (Friday 8:00 AM to 12:00 Noon) or if you have a scheduled appointment with [place], prior to your surgery, and ask for it by name. In addition, you will need to provide [place] with your name, name of your surgeon, type of surgery, and date of procedure or surgery.  *Opioid medications include: morphine, codeine, oxycodone, oxymorphone, hydrocodone, hydromorphone, meperidine, tramadol, tapentadol, buprenorphine, fentanyl, methadone. **Benzodiazepine medications include: diazepam (Valium), alprazolam (Xanax), clonazepam (Klonopine), lorazepam (Ativan), clorazepate (Tranxene), chlordiazepoxide (Librium), estazolam (Prosom), oxazepam (Serax), temazepam (Restoril), triazolam (Halcion) (Last updated: 11/27/2017) ____________________________________________________________________________________________

## 2018-01-05 NOTE — Patient Instructions (Signed)
____________________________________________________________________________________________  Medication Rules  Applies to: All patients receiving prescriptions (written or electronic).  Pharmacy of record: Pharmacy where electronic prescriptions will be sent. If written prescriptions are taken to a different pharmacy, please inform the nursing staff. The pharmacy listed in the electronic medical record should be the one where you would like electronic prescriptions to be sent.  Prescription refills: Only during scheduled appointments. Applies to both, written and electronic prescriptions.  NOTE: The following applies primarily to controlled substances (Opioid* Pain Medications).   Patient's responsibilities: 1. Pain Pills: Bring all pain pills to every appointment (except for procedure appointments). 2. Pill Bottles: Bring pills in original pharmacy bottle. Always bring newest bottle. Bring bottle, even if empty. 3. Medication refills: You are responsible for knowing and keeping track of what medications you need refilled. The day before your appointment, write a list of all prescriptions that need to be refilled. Bring that list to your appointment and give it to the admitting nurse. Prescriptions will be written only during appointments. If you forget a medication, it will not be "Called in", "Faxed", or "electronically sent". You will need to get another appointment to get these prescribed. 4. Prescription Accuracy: You are responsible for carefully inspecting your prescriptions before leaving our office. Have the discharge nurse carefully go over each prescription with you, before taking them home. Make sure that your name is accurately spelled, that your address is correct. Check the name and dose of your medication to make sure it is accurate. Check the number of pills, and the written instructions to make sure they are clear and accurate. Make sure that you are given enough medication to last  until your next medication refill appointment. 5. Taking Medication: Take medication as prescribed. Never take more pills than instructed. Never take medication more frequently than prescribed. Taking less pills or less frequently is permitted and encouraged, when it comes to controlled substances (written prescriptions).  6. Inform other Doctors: Always inform, all of your healthcare providers, of all the medications you take. 7. Pain Medication from other Providers: You are not allowed to accept any additional pain medication from any other Doctor or Healthcare provider. There are two exceptions to this rule. (see below) In the event that you require additional pain medication, you are responsible for notifying us, as stated below. 8. Medication Agreement: You are responsible for carefully reading and following our Medication Agreement. This must be signed before receiving any prescriptions from our practice. Safely store a copy of your signed Agreement. Violations to the Agreement will result in no further prescriptions. (Additional copies of our Medication Agreement are available upon request.) 9. Laws, Rules, & Regulations: All patients are expected to follow all Federal and State Laws, Statutes, Rules, & Regulations. Ignorance of the Laws does not constitute a valid excuse. The use of any illegal substances is prohibited. 10. Adopted CDC guidelines & recommendations: Target dosing levels will be at or below 60 MME/day. Use of benzodiazepines** is not recommended.  Exceptions: There are only two exceptions to the rule of not receiving pain medications from other Healthcare Providers. 1. Exception #1 (Emergencies): In the event of an emergency (i.e.: accident requiring emergency care), you are allowed to receive additional pain medication. However, you are responsible for: As soon as you are able, call our office [phone], at any time of the day or night, and leave a message stating your name, the  date and nature of the emergency, and the name and dose of the medication   prescribed. In the event that your call is answered by a member of our staff, make sure to document and save the date, time, and the name of the person that took your information.  2. Exception #2 (Planned Surgery): In the event that you are scheduled by another doctor or dentist to have any type of surgery or procedure, you are allowed (for a period no longer than 30 days), to receive additional pain medication, for the acute post-op pain. However, in this case, you are responsible for picking up a copy of our "Post-op Pain Management for Surgeons" handout, and giving it to your surgeon or dentist. This document is available at our office, and does not require an appointment to obtain it. Simply go to our office during business hours (Monday-Thursday from 8:00 AM to 4:00 PM) (Friday 8:00 AM to 12:00 Noon) or if you have a scheduled appointment with us, prior to your surgery, and ask for it by name. In addition, you will need to provide us with your name, name of your surgeon, type of surgery, and date of procedure or surgery.  *Opioid medications include: morphine, codeine, oxycodone, oxymorphone, hydrocodone, hydromorphone, meperidine, tramadol, tapentadol, buprenorphine, fentanyl, methadone. **Benzodiazepine medications include: diazepam (Valium), alprazolam (Xanax), clonazepam (Klonopine), lorazepam (Ativan), clorazepate (Tranxene), chlordiazepoxide (Librium), estazolam (Prosom), oxazepam (Serax), temazepam (Restoril), triazolam (Halcion) (Last updated: 11/27/2017) ____________________________________________________________________________________________    

## 2018-01-22 ENCOUNTER — Ambulatory Visit: Payer: Medicaid Other | Admitting: Psychiatry

## 2018-01-27 ENCOUNTER — Other Ambulatory Visit: Payer: Self-pay

## 2018-01-27 ENCOUNTER — Ambulatory Visit (INDEPENDENT_AMBULATORY_CARE_PROVIDER_SITE_OTHER): Payer: Medicaid Other | Admitting: Psychiatry

## 2018-01-27 ENCOUNTER — Encounter: Payer: Self-pay | Admitting: Psychiatry

## 2018-01-27 VITALS — BP 102/69 | HR 90 | Temp 98.3°F | Wt 216.8 lb

## 2018-01-27 DIAGNOSIS — F411 Generalized anxiety disorder: Secondary | ICD-10-CM | POA: Diagnosis not present

## 2018-01-27 DIAGNOSIS — F4000 Agoraphobia, unspecified: Secondary | ICD-10-CM

## 2018-01-27 DIAGNOSIS — F5105 Insomnia due to other mental disorder: Secondary | ICD-10-CM

## 2018-01-27 DIAGNOSIS — F331 Major depressive disorder, recurrent, moderate: Secondary | ICD-10-CM

## 2018-01-27 DIAGNOSIS — F410 Panic disorder [episodic paroxysmal anxiety] without agoraphobia: Secondary | ICD-10-CM

## 2018-01-27 NOTE — Progress Notes (Signed)
[place] MD OP Progress Note  01/27/2018 4:33 PM Dana Bradley  MRN:  [id number]  Chief Complaint: ' I am here for follow up.' Chief Complaint    Fatigue; Follow-up; Medication Refill     HPI: Dana Bradley is a 47 year old female who is unemployed, married, lives in [place], has a history of depression, mood lability, panic attacks, agoraphobia, presented to the clinic today for a follow-up visit.  She today reports she continues to be compliant on her medications as prescribed.  She is tolerating the Effexor and Trileptal well.  She denies any significant mood symptoms.  She continues to have psychosocial stressors.  She however reports overall she has been managing it well.  She has been able to cope better.  She continues to take Klonopin as needed.  She reports she takes it once every few months.  She reports she has plenty of medications left from her last refill.  Reports sleep is okay.  She reports she has been using meditation as well as soft music for sleep.  She does not want to be on medications for sleep yet.  She reports she will let writer know if she changes her mind.  She continues to have psychosocial stressors of her son who has autism, her daughter who has severe headaches, a 73 year old daughter who has seizures as well as her husband's mental health issues.  She however reports she has been coping and managing her symptoms better than before.    Visit Diagnosis:    ICD-10-CM   1. MDD (major depressive disorder), recurrent episode, moderate (HCC) F33.1   2. Panic disorder F41.0   3. 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 GAD (generalized anxiety disorder) F41.1   5. Insomnia due to mental disorder F51.05     Past Psychiatric History: I have reviewed past psychiatric history from my progress note on 12/02/2017.  Trials of medications like Prozac, Zoloft, Depakote, Cymbalta, Xanax, hydroxyzine, gabapentin, Lamictal, Lexapro, trazodone, Ambien.  Past Medical History:  Past Medical History:   Diagnosis Date  . Acute postoperative pain 04/07/2017  . Anxiety   . Bursitis   . Chronic fatigue 12/12/2017  . Edema leg 05/02/2015  . Fibromyalgia   . GERD (gastroesophageal reflux disease)   . IBS (irritable bowel syndrome)   . Knee pain, bilateral 12/21/2008   Qualifier: Diagnosis of  By: Hassell Done FNP, Tori Milks    . Lumbar discitis   . Osteoarthritis   . Right hand pain 04/10/2015   [facility] has done nerve conduction studies and ruled out carpal tunnel.   . Sleep apnea   . SVT (supraventricular tachycardia) ([place])   . Vertigo   . Vitamin D deficiency 05/01/2016    Past Surgical History:  Procedure Laterality Date  . ABLATION     Uterine  . CARDIAC CATHETERIZATION     with ablation  . COLONOSCOPY WITH PROPOFOL N/A 05/17/2015   Procedure: COLONOSCOPY WITH PROPOFOL;  Surgeon: Manya Silvas, MD;  Location: [facility] ENDOSCOPY;  Service: Endoscopy;  Laterality: N/A;  . ESOPHAGOGASTRODUODENOSCOPY N/A 05/17/2015   Procedure: ESOPHAGOGASTRODUODENOSCOPY (EGD);  Surgeon: Manya Silvas, MD;  Location: [facility] ENDOSCOPY;  Service: Endoscopy;  Laterality: N/A;  . KNEE ARTHROSCOPY    . spg     6/18  . TUBAL LIGATION  10/01/99    Family Psychiatric History: Reviewed family psychiatric history from my progress note on 12/02/2017  Family History:  Family History  Problem Relation Age of Onset  . Depression Mother   . Hypertension Mother   .  Cancer Mother        Skin  . Hyperlipidemia Mother   . Anxiety disorder Mother   . Alcohol abuse Father   . Depression Father   . Stroke Father   . Heart disease Father   . Hypertension Father   . Anxiety disorder Father   . Depression Sister   . Hyperlipidemia Sister   . Diabetes Sister   . Hypertension Sister   . Polycystic ovary syndrome Sister   . Bipolar disorder Sister   . Anxiety disorder Sister   . Cancer Maternal Grandmother 75       Breast  . Thyroid disease Maternal Grandmother   . Arthritis Maternal Grandmother   .  Hyperlipidemia Maternal Grandmother   . Depression Sister   . Hypertension Sister   . Anxiety disorder Sister   . Alzheimer's disease Unknown   . Aneurysm Maternal Grandfather   . Hypertension Maternal Grandfather   . Heart disease Maternal Grandfather   . Alzheimer's disease Paternal Grandmother   . Heart attack Paternal Grandfather   . Hypertension Paternal Grandfather   . COPD Paternal Grandfather   . Heart disease Paternal Grandfather   . Bladder Cancer Neg Hx   . Kidney cancer Neg Hx    Substance abuse history: Denies  Social History: Reviewed social history from my progress note on 12/02/2017. Social History   Socioeconomic History  . Marital status: Married    Spouse name: brian  . Number of children: 3  . Years of education: Not on file  . Highest education level: Associate degree: academic program  Occupational History    Comment: not able  Social Needs  . Financial resource strain: Very hard  . Food insecurity:    Worry: Often true    Inability: Often true  . Transportation needs:    Medical: Yes    Non-medical: Yes  Tobacco Use  . Smoking status: Former Smoker    Packs/day: 4.00    Years: 3.00    Pack years: 12.00    Types: Cigarettes    Last attempt to quit: 12/10/1992    Years since quitting: 25.1  . Smokeless tobacco: Never Used  . Tobacco comment: quit 25 years ago  Substance and Sexual Activity  . Alcohol use: No    Alcohol/week: 0.0 oz  . Drug use: No  . Sexual activity: Not Currently    Partners: Male  Lifestyle  . Physical activity:    Days per week: 0 days    Minutes per session: 0 min  . Stress: Rather much  Relationships  . Social connections:    Talks on phone: Never    Gets together: Never    Attends religious service: More than 4 times per year    Active member of club or organization: No    Attends meetings of clubs or organizations: Never    Relationship status: Married  Other Topics Concern  . Not on file  Social History  Narrative  . Not on file    Allergies:  Allergies  Allergen Reactions  . Aspirin Swelling  . Cymbalta [Duloxetine Hcl] Other (See Comments)    Suicidal ideations and has homicidal thoughts per patient  . Depakote [Divalproex Sodium] Shortness Of Breath    W/ n/v  . Duloxetine Other (See Comments)    Suicidal idealation  . Haloperidol Shortness Of Breath    W/ n/v  . Iodinated Diagnostic Agents Other (See Comments)  . Iodine Shortness Of Breath  . Meperidine Nausea  And Vomiting    Patient projectile vomits and usually result in ER  . Reglan [Metoclopramide] Shortness Of Breath    Can't breath, wheezes  . Tramadol Hcl Palpitations    Severely and adversely affects her SVT giving her tachycardias of 150-160 bpm.  . Trazodone Shortness Of Breath  . Compazine [Prochlorperazine] Other (See Comments)    Panic attack  . Meloxicam Other (See Comments)    mouth sores, tingling, blisters in mouth  . Penicillins Rash  . Tomato Hives    Tongue will blister  . Other   . Shellfish Allergy     Other reaction(s): Unknown  . Shellfish-Derived Products Other (See Comments)  . Bacitracin-Neomycin-Polymyxin Rash  . Cephalosporins Rash    rash  . Ibuprofen Other (See Comments) and Rash    Blisters in mouth. Blisters in mouth.  . Latex Itching  . Neosporin [Neomycin-Bacitracin Zn-Polymyx] Rash  . Nsaids Other (See Comments)    Blisters in mouth; can take 1 ibuprofen 2x a month  . Sulfa Antibiotics Rash    rash Other reaction(s): Unknown rash   . Sulfasalazine Rash  . Sulfonamide Derivatives Rash    Metabolic Disorder Labs: No results found for: HGBA1C, MPG No results found for: PROLACTIN Lab Results  Component Value Date   CHOL 155 12/15/2017   TRIG 349 (H) 12/15/2017   HDL 39 (L) 12/15/2017   CHOLHDL 4.0 12/15/2017   VLDL 53 (H) 05/02/2017   LDLCALC 74 12/15/2017   LDLCALC 182 (H) 11/24/2017   Lab Results  Component Value Date   TSH 2.63 05/02/2017   TSH 1.95  04/29/2016    Therapeutic Level Labs: No results found for: LITHIUM No results found for: VALPROATE No components found for:  CBMZ  Current Medications: Current Outpatient Medications  Medication Sig Dispense Refill  . acetaminophen (TYLENOL) 325 MG tablet Take 650 mg by mouth as needed.     . Calcium-Vitamin D-Vitamin K 347-425-95 MG-UNT-MCG TABS Take 1 tablet by mouth daily.     . clonazePAM (KLONOPIN) 0.5 MG tablet Take 1 tablet (0.5 mg total) by mouth 2 (two) times daily as needed for anxiety (only for severe panic sx). 60 tablet 0  . Cyanocobalamin (VITAMIN B-12) 500 MCG SUBL Place 3 tablets (1,500 mcg total) under the tongue daily. 150 tablet   . HYDROcodone-acetaminophen (NORCO) 7.5-325 MG tablet Take 1 tablet by mouth every 6 (six) hours as needed for severe pain. 30 tablet 0  . metoprolol tartrate (LOPRESSOR) 50 MG tablet TAKE 1 TABLET BY MOUTH TWICE A DAY 180 tablet 3  . omeprazole (PRILOSEC) 20 MG capsule Take 1 capsule (20 mg total) by mouth daily. 30 capsule 0  . ondansetron (ZOFRAN) 4 MG tablet Take 1 tablet (4 mg total) every 8 (eight) hours as needed by mouth. 20 tablet 2  . OXcarbazepine (TRILEPTAL) 150 MG tablet Take 1 tablet (150 mg total) by mouth 2 (two) times daily. 60 tablet 2  . pregabalin (LYRICA) 150 MG capsule Take 1 capsule (150 mg total) by mouth every 8 (eight) hours. 90 capsule 2  . rosuvastatin (CRESTOR) 10 MG tablet Take 1 tablet (10 mg total) by mouth daily. 90 tablet 3  . venlafaxine XR (EFFEXOR XR) 75 MG 24 hr capsule Take 1 capsule (75 mg total) by mouth daily with breakfast. 30 capsule 2  . Vitamin D, Ergocalciferol, (DRISDOL) 50000 units CAPS capsule Take 1 capsule (50,000 Units total) by mouth every 7 (seven) days. 4 capsule 1  . baclofen (LIORESAL) 10  MG tablet Take 1 tablet (10 mg total) 3 (three) times daily by mouth. 90 tablet 2  . diclofenac sodium (VOLTAREN) 1 % GEL Apply 2 g topically 4 (four) times daily. (Patient taking differently: Apply 2 g  topically as needed. ) 1 Tube 2   No current facility-administered medications for this visit.      Musculoskeletal: Strength & Muscle Tone: within normal limits Gait & Station: normal Patient leans: N/A  Psychiatric Specialty Exam: Review of Systems  Psychiatric/Behavioral: Positive for depression. The patient is nervous/anxious and has insomnia.   All other systems reviewed and are negative.   Blood pressure 102/69, pulse 90, temperature 98.3 F (36.8 C), temperature source Oral, weight 216 lb 12.8 oz (98.3 kg).Body mass index is 38.4 kg/m.  General Appearance: Casual  Eye Contact:  Fair  Speech:  Clear and Coherent  Volume:  Normal  Mood:  Anxious  Affect:  Congruent  Thought Process:  Goal Directed and Descriptions of Associations: Intact  Orientation:  Full (Time, Place, and Person)  Thought Content: Logical   Suicidal Thoughts:  No  Homicidal Thoughts:  No  Memory:  Immediate;   Fair Recent;   Fair Remote;   Fair  Judgement:  Fair  Insight:  Fair  Psychomotor Activity:  Normal  Concentration:  Concentration: Fair and Attention Span: Fair  Recall:  [employer] of Knowledge: Fair  Language: Fair  Akathisia:  No  Handed:  Right  AIMS (if indicated): na  Assets:  Communication Skills Desire for Improvement Housing Social Support  ADL's:  Intact  Cognition: WNL  Sleep:  varies   Screenings: PHQ2-9     Clinical Support from 01/05/2018 in [place] Procedure visit from 12/25/2017 in [place] Procedure visit from 12/16/2017 in [place] Office Visit from 12/12/2017 in [facility] Office Visit from 11/20/2017 in [facility]  PHQ-2 Total Score  0  0  0  1  2  PHQ-9 Total Score  -  -  -  -  11       Assessment and Plan: Dana Bradley is a 47 year old Caucasian female who has multiple mental health  problems as well as medical problems, presented to the clinic today for a follow-up visit.  She continues to have psychosocial stressors of medical issues as well as medical problems in her family, financial issues.  She however reports she has been managing her symptoms better than before.  She discussed plan as noted below.  Plan Depression Continue Effexor XR 75 mg p.o. daily Continue Trileptal 150 mg p.o. twice daily Discussed referral for CBT, patient cannot afford it.  Anxiety symptoms Effexor XR 75 mg p.o. daily Continue Klonopin 0.5 mg p.o. twice daily as needed for panic symptoms. She reports she takes Klonopin rarely once every few months.  She is aware about the risk of being on benzodiazepines for long-term.  Insomnia OSA, on CPAP She reports she is managing her sleep without medications.  For agoraphobia Discussed referral for CBT, patient declines due to financial issues.  Follow up in clinic in 3 months or sooner if needed.  More than 50 % of the time was spent for psychoeducation and supportive psychotherapy and care coordination.  This note was generated in part or whole with voice recognition software. Voice recognition is usually quite accurate but there are transcription errors that can and very often do occur. I  apologize for any typographical errors that were not detected and corrected.       Ursula Alert, MD 01/27/2018, 4:33 PM

## 2018-02-25 ENCOUNTER — Other Ambulatory Visit: Payer: Self-pay | Admitting: Family Medicine

## 2018-02-26 ENCOUNTER — Telehealth: Payer: Self-pay | Admitting: Cardiovascular Disease

## 2018-02-26 NOTE — Telephone Encounter (Signed)
Received records request from [employer] , forwarded to [employer] for processing. ° °

## 2018-03-02 ENCOUNTER — Telehealth: Payer: Self-pay

## 2018-03-02 DIAGNOSIS — E559 Vitamin D deficiency, unspecified: Secondary | ICD-10-CM

## 2018-03-02 DIAGNOSIS — E781 Pure hyperglyceridemia: Secondary | ICD-10-CM

## 2018-03-02 NOTE — Telephone Encounter (Signed)
Copied from [place] [id number]. Topic: Appointment Scheduling - Scheduling Inquiry for Clinic >> Mar 02, 2018 11:49 AM Synthia Innocent wrote: Reason for CRM: Would like to schedule routine lab work, insurance is about to run out. Please advise

## 2018-03-02 NOTE — Telephone Encounter (Signed)
I reviewed prior labs; only thing I see she might need is vit D and lipids (fasting); I've ordered these

## 2018-03-03 NOTE — Telephone Encounter (Signed)
Left voice mail

## 2018-03-05 ENCOUNTER — Ambulatory Visit: Payer: Medicaid Other | Attending: Nurse Practitioner | Admitting: Nurse Practitioner

## 2018-03-05 ENCOUNTER — Encounter: Payer: Self-pay | Admitting: Nurse Practitioner

## 2018-03-05 ENCOUNTER — Other Ambulatory Visit: Payer: Self-pay

## 2018-03-05 VITALS — BP 107/65 | HR 69 | Temp 98.3°F | Resp 16 | Ht 60.0 in | Wt 220.0 lb

## 2018-03-05 DIAGNOSIS — M797 Fibromyalgia: Secondary | ICD-10-CM | POA: Diagnosis not present

## 2018-03-05 DIAGNOSIS — M533 Sacrococcygeal disorders, not elsewhere classified: Secondary | ICD-10-CM | POA: Insufficient documentation

## 2018-03-05 DIAGNOSIS — M4712 Other spondylosis with myelopathy, cervical region: Secondary | ICD-10-CM | POA: Diagnosis not present

## 2018-03-05 DIAGNOSIS — M5481 Occipital neuralgia: Secondary | ICD-10-CM | POA: Diagnosis not present

## 2018-03-05 DIAGNOSIS — E669 Obesity, unspecified: Secondary | ICD-10-CM | POA: Insufficient documentation

## 2018-03-05 DIAGNOSIS — M47816 Spondylosis without myelopathy or radiculopathy, lumbar region: Secondary | ICD-10-CM

## 2018-03-05 DIAGNOSIS — F329 Major depressive disorder, single episode, unspecified: Secondary | ICD-10-CM | POA: Insufficient documentation

## 2018-03-05 DIAGNOSIS — Z79891 Long term (current) use of opiate analgesic: Secondary | ICD-10-CM

## 2018-03-05 DIAGNOSIS — M4722 Other spondylosis with radiculopathy, cervical region: Secondary | ICD-10-CM | POA: Diagnosis not present

## 2018-03-05 DIAGNOSIS — G4733 Obstructive sleep apnea (adult) (pediatric): Secondary | ICD-10-CM | POA: Diagnosis not present

## 2018-03-05 DIAGNOSIS — G894 Chronic pain syndrome: Secondary | ICD-10-CM | POA: Diagnosis not present

## 2018-03-05 DIAGNOSIS — E785 Hyperlipidemia, unspecified: Secondary | ICD-10-CM | POA: Diagnosis not present

## 2018-03-05 DIAGNOSIS — I10 Essential (primary) hypertension: Secondary | ICD-10-CM | POA: Insufficient documentation

## 2018-03-05 DIAGNOSIS — K219 Gastro-esophageal reflux disease without esophagitis: Secondary | ICD-10-CM | POA: Insufficient documentation

## 2018-03-05 DIAGNOSIS — Z6841 Body Mass Index (BMI) 40.0 and over, adult: Secondary | ICD-10-CM | POA: Insufficient documentation

## 2018-03-05 DIAGNOSIS — Z5181 Encounter for therapeutic drug level monitoring: Secondary | ICD-10-CM | POA: Diagnosis present

## 2018-03-05 DIAGNOSIS — Z79899 Other long term (current) drug therapy: Secondary | ICD-10-CM | POA: Insufficient documentation

## 2018-03-05 MED ORDER — HYDROCODONE-ACETAMINOPHEN 7.5-325 MG PO TABS: 1.0000 | ORAL_TABLET | Freq: Four times a day (QID) | ORAL | 0 refills | Status: DC | PRN

## 2018-03-05 MED ORDER — DOCUSATE SODIUM 100 MG PO CAPS: 100.0000 mg | ORAL_CAPSULE | Freq: Two times a day (BID) | ORAL | 2 refills | Status: AC

## 2018-03-05 MED ORDER — PREGABALIN 150 MG PO CAPS: 150.0000 mg | ORAL_CAPSULE | Freq: Three times a day (TID) | ORAL | 2 refills | Status: DC

## 2018-03-05 NOTE — Progress Notes (Signed)
Nursing Pain Medication Assessment:  Safety precautions to be maintained throughout the outpatient stay will include: orient to surroundings, keep bed in low position, maintain call bell within reach at all times, provide assistance with transfer out of bed and ambulation.  Medication Inspection Compliance: Pill count conducted under aseptic conditions, in front of the patient. Neither the pills nor the bottle was removed from the patient's sight at any time. Once count was completed pills were immediately returned to the patient in their original bottle.  Medication #1: Hydrocodone/APAP Pill/Patch Count: 0 of 30 pills remain Pill/Patch Appearance: Markings consistent with prescribed medication Bottle Appearance: Standard pharmacy container. Clearly labeled. Filled Date:12/16/17/ 2018 Last Medication intake:  Yesterday  Medication #2: Oxycodone/APAP Pill/Patch Count: 0 of 21 pills remain Pill/Patch Appearance: Markings consistent with prescribed medication Bottle Appearance: Standard pharmacy container. Clearly labeled. Filled Date: 03/28 / 2019 Last Medication intake:  Ran out of medicine more than 48 hours ago  Percocet was for post RF pain

## 2018-03-05 NOTE — Patient Instructions (Addendum)
____________________________________________________________________________________________  Medication Rules  Applies to: All patients receiving prescriptions (written or electronic).  Pharmacy of record: Pharmacy where electronic prescriptions will be sent. If written prescriptions are taken to a different pharmacy, please inform the nursing staff. The pharmacy listed in the electronic medical record should be the one where you would like electronic prescriptions to be sent.  Prescription refills: Only during scheduled appointments. Applies to both, written and electronic prescriptions.  NOTE: The following applies primarily to controlled substances (Opioid* Pain Medications).   Patient's responsibilities: 1. Pain Pills: Bring all pain pills to every appointment (except for procedure appointments). 2. Pill Bottles: Bring pills in original pharmacy bottle. Always bring newest bottle. Bring bottle, even if empty. 3. Medication refills: You are responsible for knowing and keeping track of what medications you need refilled. The day before your appointment, write a list of all prescriptions that need to be refilled. Bring that list to your appointment and give it to the admitting nurse. Prescriptions will be written only during appointments. If you forget a medication, it will not be "Called in", "Faxed", or "electronically sent". You will need to get another appointment to get these prescribed. 4. Prescription Accuracy: You are responsible for carefully inspecting your prescriptions before leaving our office. Have the discharge nurse carefully go over each prescription with you, before taking them home. Make sure that your name is accurately spelled, that your address is correct. Check the name and dose of your medication to make sure it is accurate. Check the number of pills, and the written instructions to make sure they are clear and accurate. Make sure that you are given enough medication to last  until your next medication refill appointment. 5. Taking Medication: Take medication as prescribed. Never take more pills than instructed. Never take medication more frequently than prescribed. Taking less pills or less frequently is permitted and encouraged, when it comes to controlled substances (written prescriptions).  6. Inform other Doctors: Always inform, all of your healthcare providers, of all the medications you take. 7. Pain Medication from other Providers: You are not allowed to accept any additional pain medication from any other Doctor or Healthcare provider. There are two exceptions to this rule. (see below) In the event that you require additional pain medication, you are responsible for notifying us, as stated below. 8. Medication Agreement: You are responsible for carefully reading and following our Medication Agreement. This must be signed before receiving any prescriptions from our practice. Safely store a copy of your signed Agreement. Violations to the Agreement will result in no further prescriptions. (Additional copies of our Medication Agreement are available upon request.) 9. Laws, Rules, & Regulations: All patients are expected to follow all Federal and State Laws, Statutes, Rules, & Regulations. Ignorance of the Laws does not constitute a valid excuse. The use of any illegal substances is prohibited. 10. Adopted CDC guidelines & recommendations: Target dosing levels will be at or below 60 MME/day. Use of benzodiazepines** is not recommended.  Exceptions: There are only two exceptions to the rule of not receiving pain medications from other Healthcare Providers. 1. Exception #1 (Emergencies): In the event of an emergency (i.e.: accident requiring emergency care), you are allowed to receive additional pain medication. However, you are responsible for: As soon as you are able, call our office [phone], at any time of the day or night, and leave a message stating your name, the  date and nature of the emergency, and the name and dose of the medication   prescribed. In the event that your call is answered by a member of our staff, make sure to document and save the date, time, and the name of the person that took your information.  2. Exception #2 (Planned Surgery): In the event that you are scheduled by another doctor or dentist to have any type of surgery or procedure, you are allowed (for a period no longer than 30 days), to receive additional pain medication, for the acute post-op pain. However, in this case, you are responsible for picking up a copy of our "Post-op Pain Management for Surgeons" handout, and giving it to your surgeon or dentist. This document is available at our office, and does not require an appointment to obtain it. Simply go to our office during business hours (Monday-Thursday from 8:00 AM to 4:00 PM) (Friday 8:00 AM to 12:00 Noon) or if you have a scheduled appointment with [place], prior to your surgery, and ask for it by name. In addition, you will need to provide [place] with your name, name of your surgeon, type of surgery, and date of procedure or surgery.  *Opioid medications include: morphine, codeine, oxycodone, oxymorphone, hydrocodone, hydromorphone, meperidine, tramadol, tapentadol, buprenorphine, fentanyl, methadone. **Benzodiazepine medications include: diazepam (Valium), alprazolam (Xanax), clonazepam (Klonopine), lorazepam (Ativan), clorazepate (Tranxene), chlordiazepoxide (Librium), estazolam (Prosom), oxazepam (Serax), temazepam (Restoril), triazolam (Halcion) (Last updated: 11/27/2017) ____________________________________________________________________________________________    BMI Assessment: Estimated body mass index is 42.97 kg/m as calculated from the following:   Height as of this encounter: 5' (1.524 m).   Weight as of this encounter: 220 lb (99.8 kg).  BMI interpretation table: BMI level Category Range association with higher incidence  of chronic pain  <18 kg/m2 Underweight   18.5-24.9 kg/m2 Ideal body weight   25-29.9 kg/m2 Overweight Increased incidence by 20%  30-34.9 kg/m2 Obese (Class I) Increased incidence by 68%  35-39.9 kg/m2 Severe obesity (Class II) Increased incidence by 136%  >40 kg/m2 Extreme obesity (Class III) Increased incidence by 254%   Patient's current BMI Ideal Body weight  Body mass index is 42.97 kg/m. Ideal body weight: 45.5 kg (100 lb 4.9 oz) Adjusted ideal body weight: 67.2 kg (148 lb 3 oz)   BMI Readings from Last 4 Encounters:  03/05/18 42.97 kg/m  01/05/18 37.38 kg/m  12/25/17 37.73 kg/m  12/19/17 37.91 kg/m   Wt Readings from Last 4 Encounters:  03/05/18 220 lb (99.8 kg)  01/05/18 211 lb (95.7 kg)  12/25/17 213 lb (96.6 kg)  12/19/17 214 lb (97.1 kg)   You were given two prescriptions for Hydrocodone, one each for Ducosate and Lyrica today.

## 2018-03-05 NOTE — Progress Notes (Signed)
Patient's Name: Dana Bradley  MRN: [id number]  Referring Provider: Arnetha Courser, MD  DOB: [birth year]  PCP: Arnetha Courser, MD  DOS: 03/05/2018  Note by: Vevelyn Francois NP  Service setting: Ambulatory outpatient  Specialty: Interventional Pain Management  Location: [facility] (AMB) Pain Management Facility    Patient type: Established    Primary Reason(s) for Visit: Encounter for prescription drug management. (Level of risk: moderate)  CC: Muscle Pain (fibromyalgia); Back Pain (lower); and Hip Pain (left, radiating down left leg)  HPI  Dana Bradley is a 47 y.o. year old, female patient, who comes today for a medication management evaluation. She has Depression, major, recurrent, in remission ([place]); Hypertension, benign essential, goal below 140/90; History of PSVT (paroxysmal supraventricular tachycardia); GERD; Chest pain; Obstructive sleep apnea, adult; Menorrhagia; Cervico-occipital neuralgia; DDD (degenerative disc disease), lumbosacral; Paroxysmal supraventricular tachycardia ([place]); Exertional shortness of breath; Chronic pain of multiple joints; Chronic superficial gastritis; DDD (degenerative disc disease), lumbar; Fibromyalgia; Insomnia; Migraine without aura and with status migrainosus, not intractable; Chronic lower extremity cramps (Bilateral) (R>L); Obesity; GAD (generalized anxiety disorder); Fatigue; Atypical lymphocytosis; Vitamin D insufficiency; Panic disorder with agoraphobia; Depression, unspecified depression type; Chronic pain syndrome; Long term prescription opiate use; Opiate use; Long term prescription benzodiazepine use; Neurogenic pain; Chronic low back pain (Primary Source of Pain) (Bilateral) (R>L) (midline); Chronic upper back pain (Secondary source of pain) (Bilateral) (L>R); Chronic abdominal pain (Right lower quadrant); Thoracic radiculitis (Right) (T11 dermatome); Chronic occipital neuralgia [facility] source of pain) (Bilateral) (L>R); Chronic neck pain; Chronic cervical radicular  pain (Bilateral) (L>R); Chronic shoulder blade pain (Bilateral) (L>R); Chronic upper extremity pain (Bilateral) (R>L); Chronic knee pain (Bilateral) (R>L); Chronic ankle pain (Bilateral); Cervical spondylosis with myelopathy and radiculopathy; Nephrolithiasis; Controlled substance agreement signed; Plantar fasciitis of left foot; Vitamin B12 deficiency; Hyperlipidemia; Medication monitoring encounter; Cervicogenic headache; Bilateral leg edema; Bright red rectal bleeding; History of vasovagal episode; Hypertriglyceridemia; Chronic left hip pain; Chronic sacroiliac joint pain (Left); Lumbar facet joint syndrome (B) (L>R); Left lumbar radiculitis; Lumbar spondylosis; Migraine headache; Muscle spasticity; Osteoarthritis of shoulder (B); Lumbar L1-2 disc protrusion (Right); Acute postoperative pain; Myofascial pain syndrome (Left) (trapezius muscle); Trigger point of shoulder region (Left); Low serum cortisol level ([place]); Chronic fatigue syndrome with fibromyalgia; and Occipital headache on their problem list. Her primarily concern today is the Muscle Pain (fibromyalgia); Back Pain (lower); and Hip Pain (left, radiating down left leg)  Pain Assessment: Location:   Other (Comment)(Pain "all over") Radiating: denies Onset: More than a month ago Duration: Chronic pain Quality: Aching, Cramping Severity: 4 /10 (subjective, self-reported pain score)  Note: Reported level is compatible with observation. Clinically the patient looks like a 0/10              Timing: Constant Modifying factors: medications BP: 107/65  HR: 69  Dana Bradley was last scheduled for an appointment on 01/05/2018 for medication management. During today's appointment we reviewed Dana Bradley's chronic pain status, as well as her outpatient medication regimen. She admits that her medicaid is ending. She admits that she was not able to refill her Lyrica and was in increased pain. She admits that she had to use the Norco which she does not like to  use. She admits that she is not able to do any ADL without assistance even using the bathroom. She admits that she has failed all interventional therapy.  The patient  reports that she does not use drugs. Her body mass index is 42.97 kg/m.  Further details on  both, my assessment(s), as well as the proposed treatment plan, please see below.  Controlled Substance Pharmacotherapy Assessment REMS (Risk Evaluation and Mitigation Strategy)  Analgesic:Hydrocodone/APAP 7.5/325 one daily MME/day:7.63m/day.   WLandis Martins RN  03/05/2018  9:18 AM  Sign at close encounter Nursing Pain Medication Assessment:  Safety precautions to be maintained throughout the outpatient stay will include: orient to surroundings, keep bed in low position, maintain call bell within reach at all times, provide assistance with transfer out of bed and ambulation.  Medication Inspection Compliance: Pill count conducted under aseptic conditions, in front of the patient. Neither the pills nor the bottle was removed from the patient's sight at any time. Once count was completed pills were immediately returned to the patient in their original bottle.  Medication #1: Hydrocodone/APAP Pill/Patch Count: 0 of 30 pills remain Pill/Patch Appearance: Markings consistent with prescribed medication Bottle Appearance: Standard pharmacy container. Clearly labeled. Filled Date:12/16/17/ 2018 Last Medication intake:  Yesterday  Medication #2: Oxycodone/APAP Pill/Patch Count: 0 of 21 pills remain Pill/Patch Appearance: Markings consistent with prescribed medication Bottle Appearance: Standard pharmacy container. Clearly labeled. Filled Date: 03/28 / 2019 Last Medication intake:  Ran out of medicine more than 48 hours ago  Percocet was for post RF pain   Pharmacokinetics: Liberation and absorption (onset of action): WNL Distribution (time to peak effect): WNL Metabolism and excretion (duration of action): WNL          Pharmacodynamics: Desired effects: Analgesia: Ms. PCarignanreports >50% benefit. Functional ability: Patient reports that medication allows her to accomplish basic ADLs Clinically meaningful improvement in function (CMIF): Sustained CMIF goals met Perceived effectiveness: Described as relatively effective, allowing for increase in activities of daily living (ADL) Undesirable effects: Side-effects or Adverse reactions: None reported Monitoring:  PMP: Online review of the past 161-montheriod conducted. Compliant with practice rules and regulations Last UDS on record: Summary  Date Value Ref Range Status  04/01/2017 FINAL  Final    Comment:    ==================================================================== TOXASSURE SELECT 13 (MW) ==================================================================== Test                             Result       Flag       Units Drug Present and Declared for Prescription Verification   Hydrocodone                    366          EXPECTED   ng/mg creat   Hydromorphone                  121          EXPECTED   ng/mg creat   Dihydrocodeine                 34           EXPECTED   ng/mg creat   Norhydrocodone                 295          EXPECTED   ng/mg creat    Sources of hydrocodone include scheduled prescription    medications. Hydromorphone, dihydrocodeine and norhydrocodone are    expected metabolites of hydrocodone. Hydromorphone and    dihydrocodeine are also available as scheduled prescription    medications. Drug Absent but Declared for Prescription Verification   Alprazolam  Not Detected UNEXPECTED ng/mg creat ==================================================================== Test                      Result    Flag   Units      Ref Range   Creatinine              174              mg/dL      >=20 ==================================================================== Declared Medications:  The flagging and interpretation  on this report are based on the  following declared medications.  Unexpected results may arise from  inaccuracies in the declared medications.  **Note: The testing scope of this panel includes these medications:  Alprazolam (Xanax)  Hydrocodone (Norco)  **Note: The testing scope of this panel does not include following  reported medications:  Acetaminophen (Norco)  Amitriptyline  Atorvastatin  Baclofen (Lioresal)  Calcium  Cyanocobalamin  Diclofenac  Lamotrigine  Magnesium  Metoprolol (Lopressor)  Omega-3 Fatty Acids (Lovaza)  Pregabalin (Lyrica)  Vitamin D  Vitamin K ==================================================================== For clinical consultation, please call [phone]. ====================================================================    UDS interpretation: Compliant          Medication Assessment Form: Reviewed. Patient indicates being compliant with therapy Treatment compliance: Compliant Risk Assessment Profile: Aberrant behavior: See prior evaluations. None observed or detected today Comorbid factors increasing risk of overdose: See prior notes. No additional risks detected today Risk of substance use disorder (SUD): Low Opioid Risk Tool - 01/05/18 0850      Family History of Substance Abuse   Alcohol  Positive Female    Illegal Drugs  Negative    Rx Drugs  Negative      Personal History of Substance Abuse   Alcohol  Negative    Illegal Drugs  Negative    Rx Drugs  Negative      Age   Age between 37-45 years   No      Psychological Disease   Psychological Disease  Negative    Depression  Positive      Total Score   Opioid Risk Tool Scoring  2    Opioid Risk Interpretation  Low Risk      ORT Scoring interpretation table:  Score <3 = Low Risk for SUD  Score between 4-7 = Moderate Risk for SUD  Score >8 = High Risk for Opioid Abuse   Risk Mitigation Strategies:  Patient Counseling: Covered Patient-Prescriber Agreement (PPA):  Present and active  Notification to other healthcare providers: Done  Pharmacologic Plan: No change in therapy, at this time.             Laboratory Chemistry  Inflammation Markers (CRP: Acute Phase) (ESR: Chronic Phase) Lab Results  Component Value Date   CRP 4.5 05/02/2017   ESRSEDRATE 11 04/01/2017                         Rheumatology Markers Lab Results  Component Value Date   RF <14 12/15/2017   ANA NEGATIVE 12/15/2017                        Renal Function Markers Lab Results  Component Value Date   BUN 13 11/24/2017   CREATININE 0.80 35/57/3220   BCR NOT APPLICABLE 25/42/7062   GFRAA 102 11/24/2017   GFRNONAA 88 11/24/2017  Hepatic Function Markers Lab Results  Component Value Date   AST 14 11/24/2017   ALT 21 11/24/2017   ALBUMIN 4.1 04/01/2017   ALKPHOS 84 04/01/2017                        Electrolytes Lab Results  Component Value Date   NA 141 11/24/2017   K 4.2 11/24/2017   CL 105 11/24/2017   CALCIUM 9.8 11/24/2017   MG 2.1 04/01/2017                        Neuropathy Markers Lab Results  Component Value Date   OYDXAJOI78 676 11/24/2017   HIV NON REAC 01/12/2009                        Bone Pathology Markers Lab Results  Component Value Date   VD25OH 17 (L) 11/24/2017   25OHVITD1 20 (L) 04/01/2017   25OHVITD2 5.4 04/01/2017   25OHVITD3 15 04/01/2017                         Coagulation Parameters Lab Results  Component Value Date   INR 1.01 04/23/2010   LABPROT 13.2 04/23/2010   PLT 299 11/24/2017   DDIMER 0.57 (H) 05/02/2017                        Cardiovascular Markers Lab Results  Component Value Date   BNP 14.2 05/02/2017   CKTOTAL 40 12/15/2017   TROPONINI <0.03 05/02/2017   HGB 13.9 11/24/2017   HCT 39.2 11/24/2017                         CA Markers No results found for: CEA, CA125, LABCA2                      Note: Lab results reviewed.  Recent Diagnostic Imaging Results  NM Myocar  Multi W/Spect W/Wall Motion / EF  Blood pressure demonstrated a normal response to exercise.  There was no ST segment deviation noted during stress.  No T wave inversion was noted during stress.  The study is normal.  This is a low risk study.  The left ventricular ejection fraction is normal (55-65%).    Complexity Note: Imaging results reviewed. Results shared with Ms. Luckenbaugh, using Layman's terms.                         Meds   Current Outpatient Medications:  .  acetaminophen (TYLENOL) 325 MG tablet, Take 650 mg by mouth as needed. , Disp: , Rfl:  .  Calcium-Vitamin D-Vitamin K 720-947-09 MG-UNT-MCG TABS, Take 1 tablet by mouth daily. , Disp: , Rfl:  .  clonazePAM (KLONOPIN) 0.5 MG tablet, Take 1 tablet (0.5 mg total) by mouth 2 (two) times daily as needed for anxiety (only for severe panic sx)., Disp: 60 tablet, Rfl: 0 .  Cyanocobalamin (VITAMIN B-12) 500 MCG SUBL, Place 3 tablets (1,500 mcg total) under the tongue daily., Disp: 150 tablet, Rfl:  .  HYDROcodone-acetaminophen (NORCO) 7.5-325 MG tablet, Take 1 tablet by mouth every 6 (six) hours as needed for severe pain., Disp: 30 tablet, Rfl: 0 .  metoprolol tartrate (LOPRESSOR) 50 MG tablet, TAKE 1 TABLET BY MOUTH TWICE A DAY, Disp: 180 tablet, Rfl: 3 .  omeprazole (PRILOSEC) 20 MG capsule, Take 1 capsule (20 mg total) by mouth daily., Disp: 30 capsule, Rfl: 0 .  ondansetron (ZOFRAN) 4 MG tablet, Take 1 tablet (4 mg total) every 8 (eight) hours as needed by mouth., Disp: 20 tablet, Rfl: 2 .  pregabalin (LYRICA) 150 MG capsule, Take 1 capsule (150 mg total) by mouth every 8 (eight) hours., Disp: 90 capsule, Rfl: 2 .  rosuvastatin (CRESTOR) 10 MG tablet, Take 1 tablet (10 mg total) by mouth daily., Disp: 90 tablet, Rfl: 3 .  baclofen (LIORESAL) 10 MG tablet, Take 1 tablet (10 mg total) 3 (three) times daily by mouth., Disp: 90 tablet, Rfl: 2 .  diclofenac sodium (VOLTAREN) 1 % GEL, Apply 2 g topically 4 (four) times daily. (Patient  taking differently: Apply 2 g topically as needed. ), Disp: 1 Tube, Rfl: 2 .  docusate sodium (COLACE) 100 MG capsule, Take 1 capsule (100 mg total) by mouth 2 (two) times daily., Disp: 60 capsule, Rfl: 2 .  HYDROcodone-acetaminophen (NORCO) 7.5-325 MG tablet, Take 1 tablet by mouth every 6 (six) hours as needed for moderate pain., Disp: 30 tablet, Rfl: 0  ROS  Constitutional: Denies any fever or chills Gastrointestinal: No reported hemesis, hematochezia, vomiting, or acute GI distress Musculoskeletal: Denies any acute onset joint swelling, redness, loss of ROM, or weakness Neurological: No reported episodes of acute onset apraxia, aphasia, dysarthria, agnosia, amnesia, paralysis, loss of coordination, or loss of consciousness  Allergies  Ms. Halvorsen is allergic to aspirin; cymbalta [duloxetine hcl]; depakote [divalproex sodium]; duloxetine; haloperidol; iodinated diagnostic agents; iodine; meperidine; reglan [metoclopramide]; tramadol hcl; trazodone; compazine [prochlorperazine]; meloxicam; penicillins; tomato; other; shellfish allergy; shellfish-derived products; bacitracin-neomycin-polymyxin; cephalosporins; ibuprofen; latex; neosporin [neomycin-bacitracin zn-polymyx]; nsaids; sulfa antibiotics; sulfasalazine; and sulfonamide derivatives.  PFSH  Drug: Ms. Meares  reports that she does not use drugs. Alcohol:  reports that she does not drink alcohol. Tobacco:  reports that she quit smoking about 25 years ago. Her smoking use included cigarettes. She has a 12.00 pack-year smoking history. She has never used smokeless tobacco. Medical:  has a past medical history of Acute postoperative pain (04/07/2017), Anxiety, Bursitis, Chronic fatigue (12/12/2017), Edema leg (05/02/2015), Fibromyalgia, GERD (gastroesophageal reflux disease), IBS (irritable bowel syndrome), Knee pain, bilateral (12/21/2008), Lumbar discitis, Osteoarthritis, Right hand pain (04/10/2015), Sleep apnea, SVT (supraventricular tachycardia)  ([place]), Vertigo, and Vitamin D deficiency (05/01/2016). Surgical: Ms. Hazelett  has a past surgical history that includes Ablation; Tubal ligation (10/01/99); Cardiac catheterization; Knee arthroscopy; Colonoscopy with propofol (N/A, 05/17/2015); Esophagogastroduodenoscopy (N/A, 05/17/2015); and spg. Family: family history includes Alcohol abuse in her father; Alzheimer's disease in her paternal grandmother and unknown relative; Aneurysm in her maternal grandfather; Anxiety disorder in her father, mother, sister, and sister; Arthritis in her maternal grandmother; Bipolar disorder in her sister; COPD in her paternal grandfather; Cancer in her mother; Cancer (age of onset: 62) in her maternal grandmother; Depression in her father, mother, sister, and sister; Diabetes in her sister; Heart attack in her paternal grandfather; Heart disease in her father, maternal grandfather, and paternal grandfather; Hyperlipidemia in her maternal grandmother, mother, and sister; Hypertension in her father, maternal grandfather, mother, paternal grandfather, sister, and sister; Polycystic ovary syndrome in her sister; Stroke in her father; Thyroid disease in her maternal grandmother.  Constitutional Exam  General appearance: Well nourished, well developed, and well hydrated. In no apparent acute distress Vitals:   03/05/18 0909  BP: 107/65  Pulse: 69  Resp: 16  Temp: 98.3 F (36.8 C)  TempSrc: Oral  SpO2: 100%  Weight: 220 lb (99.8 kg)  Height: 5' (1.524 m)  HC: 3" (7.6 cm)  Psych/Mental status: Alert, oriented x 3 (person, place, & time)       Eyes: PERLA Respiratory: No evidence of acute respiratory distress  Cervical Spine Area Exam  Skin & Axial Inspection: No masses, redness, edema, swelling, or associated skin lesions Alignment: Symmetrical Functional ROM: Unrestricted ROM      Stability: No instability detected Muscle Tone/Strength: Functionally intact. No obvious neuro-muscular anomalies detected. Sensory  (Neurological): Unimpaired Palpation: Tender              Upper Extremity (UE) Exam    Side: Right upper extremity  Side: Left upper extremity  Skin & Extremity Inspection: Skin color, temperature, and hair growth are WNL. No peripheral edema or cyanosis. No masses, redness, swelling, asymmetry, or associated skin lesions. No contractures.  Skin & Extremity Inspection: Skin color, temperature, and hair growth are WNL. No peripheral edema or cyanosis. No masses, redness, swelling, asymmetry, or associated skin lesions. No contractures.  Functional ROM: Unrestricted ROM          Functional ROM: Unrestricted ROM          Muscle Tone/Strength: Functionally intact. No obvious neuro-muscular anomalies detected.  Muscle Tone/Strength: Functionally intact. No obvious neuro-muscular anomalies detected.  Sensory (Neurological): Unimpaired          Sensory (Neurological): Unimpaired          Palpation: No palpable anomalies              Palpation: No palpable anomalies              Provocative Test(s):  Phalen's test: deferred Tinel's test: deferred Apley's scratch test (touch opposite shoulder):  Action 1 (Across chest): deferred Action 2 (Overhead): deferred Action 3 (LB reach): deferred   Provocative Test(s):  Phalen's test: deferred Tinel's test: deferred Apley's scratch test (touch opposite shoulder):  Action 1 (Across chest): deferred Action 2 (Overhead): deferred Action 3 (LB reach): deferred    Thoracic Spine Area Exam  Skin & Axial Inspection: No masses, redness, or swelling Alignment: Symmetrical Functional ROM: Unrestricted ROM Stability: No instability detected Muscle Tone/Strength: Functionally intact. No obvious neuro-muscular anomalies detected. Sensory (Neurological): Unimpaired Muscle strength & Tone: No palpable anomalies  Lumbar Spine Area Exam  Skin & Axial Inspection: No masses, redness, or swelling Alignment: Symmetrical Functional ROM: Unrestricted ROM        Stability: No instability detected Muscle Tone/Strength: Functionally intact. No obvious neuro-muscular anomalies detected. Sensory (Neurological): Unimpaired Palpation: No palpable anomalies       Provocative Tests: Lumbar Hyperextension/rotation test: deferred today       Lumbar quadrant test (Kemp's test): deferred today       Lumbar Lateral bending test: deferred today       Patrick's Maneuver: deferred today                   FABER test: deferred today       Thigh-thrust test: deferred today       S-I compression test: deferred today       S-I distraction test: deferred today        Gait & Posture Assessment  Ambulation: Unassisted Gait: Relatively normal for age and body habitus Posture: WNL   Lower Extremity Exam    Side: Right lower extremity  Side: Left lower extremity  Stability: No instability observed          Stability: No  instability observed          Skin & Extremity Inspection: Skin color, temperature, and hair growth are WNL. No peripheral edema or cyanosis. No masses, redness, swelling, asymmetry, or associated skin lesions. No contractures.  Skin & Extremity Inspection: Skin color, temperature, and hair growth are WNL. No peripheral edema or cyanosis. No masses, redness, swelling, asymmetry, or associated skin lesions. No contractures.  Functional ROM: Unrestricted ROM                  Functional ROM: Unrestricted ROM                  Muscle Tone/Strength: Functionally intact. No obvious neuro-muscular anomalies detected.  Muscle Tone/Strength: Functionally intact. No obvious neuro-muscular anomalies detected.  Sensory (Neurological): Unimpaired  Sensory (Neurological): Unimpaired  Palpation: No palpable anomalies  Palpation: No palpable anomalies   Assessment  Primary Diagnosis & Pertinent Problem List: The primary encounter diagnosis was Fibromyalgia. Diagnoses of Lumbar spondylosis, Cervical spondylosis with myelopathy and radiculopathy, Cervico-occipital  neuralgia, Chronic pain syndrome, and Long term prescription opiate use were also pertinent to this visit.  Status Diagnosis  Persistent Controlled Persistent 1. Fibromyalgia   2. Lumbar spondylosis   3. Cervical spondylosis with myelopathy and radiculopathy   4. Cervico-occipital neuralgia   5. Chronic pain syndrome   6. Long term prescription opiate use     Problems updated and reviewed during this visit: No problems updated. Plan of Care  Pharmacotherapy (Medications Ordered): Meds ordered this encounter  Medications  . pregabalin (LYRICA) 150 MG capsule    Sig: Take 1 capsule (150 mg total) by mouth every 8 (eight) hours.    Dispense:  90 capsule    Refill:  2    Do not add this medication to the electronic "Automatic Refill" notification system. Patient may have prescription filled one day early if pharmacy is closed on scheduled refill date. 30 days    Order Specific Question:   Supervising Provider    Answer:   Milinda Pointer [phone]  . HYDROcodone-acetaminophen (NORCO) 7.5-325 MG tablet    Sig: Take 1 tablet by mouth every 6 (six) hours as needed for severe pain.    Dispense:  30 tablet    Refill:  0    Do not place this medication, or any other prescription from our practice, on "Automatic Refill". Patient may have prescription filled one day early if pharmacy is closed on scheduled refill date. Do not fill until: 03/05/2018    Order Specific Question:   Supervising Provider    Answer:   Milinda Pointer [phone]  . docusate sodium (COLACE) 100 MG capsule    Sig: Take 1 capsule (100 mg total) by mouth 2 (two) times daily.    Dispense:  60 capsule    Refill:  2    Order Specific Question:   Supervising Provider    Answer:   Milinda Pointer [phone]  . HYDROcodone-acetaminophen (NORCO) 7.5-325 MG tablet    Sig: Take 1 tablet by mouth every 6 (six) hours as needed for moderate pain.    Dispense:  30 tablet    Refill:  0    Do not place this medication, or any  other prescription from our practice, on "Automatic Refill". Patient may have prescription filled one day early if pharmacy is closed on scheduled refill date. Do not fill until: 04/04/2018    Order Specific Question:   Supervising Provider    Answer:   Milinda Pointer [phone]  New Prescriptions   DOCUSATE SODIUM (COLACE) 100 MG CAPSULE    Take 1 capsule (100 mg total) by mouth 2 (two) times daily.   HYDROCODONE-ACETAMINOPHEN (NORCO) 7.5-325 MG TABLET    Take 1 tablet by mouth every 6 (six) hours as needed for moderate pain.   Medications administered today: Laveda Demedeiros had no medications administered during this visit. Lab-work, procedure(s), and/or referral(s): Orders Placed This Encounter  Procedures  . TRIGGER POINT INJECTION  . ToxASSURE Select 13 (MW), Urine   Imaging and/or referral(s): None  Interventional management options: Planned, scheduled, and/or pending: Trigger point injections   Considering: Therapeutic left cervical epidural steroid injection Palliative left L4-5 lumbar epidural steroid injections Diagnostic bilateral lumbar facet block Possible bilateral lumbar facet radiofrequency ablation. Diagnostic thoracic facet block Possible bilateral thoracic facet radiofrequency ablation. Diagnostic bilateral cervical facet blocks Possible bilateral cervical facet radiofrequency ablation. Diagnostic bilateral lesser occipital nerve blocks Diagnostic bilateral C2 and TON nerve block Possible bilateral occipital nerve radiofrequency ablation. Diagnostic right T10-11 thoracic epidural steroid injection Diagnostic bilateral suprascapular nerve block Diagnostic bilateral intra-articular knee joint injection Possible bilateral intra-articular Hyalgan knee injections.  Diagnostic bilateral Genicular nerve block Possible bilateral Genicular nerve radiofrequency ablation.   Palliative PRN treatment(s): Palliative left trapezius muscle trigger point  injection Palliative left L4-5 lumbar epidural steroid injections Diagnostic bilateral lumbar facet block Diagnostic thoracic facet block PalliativeLeft cervical epidural steroid injections Diagnostic bilateral cervical facet blocks Diagnostic bilateral lesser occipital nerve blocks Diagnostic bilateral C2 and TON nerve block Diagnostic right T10-11 thoracic epidural steroid injection Diagnostic bilateral suprascapular nerve block Diagnostic bilateral intra-articular knee joint injection Diagnostic bilateral Genicular nerve block   Provider-requested follow-up: Return in about 3 months (around 06/05/2018) for MedMgmt with Me Dionisio David), in addition, w/ Dr. Dossie Arbour, Procedure(NS).  Future Appointments  Date Time Provider [place]  03/19/2018 10:15 AM Milinda Pointer, MD [facility] None  03/31/2018  8:45 AM Ursula Alert, MD [facility] None   Primary Care Physician: Arnetha Courser, MD Location: [facility] Note by: Vevelyn Francois NP Date: 03/05/2018; Time: 10:12 AM  Pain Score Disclaimer: We use the NRS-11 scale. This is a self-reported, subjective measurement of pain severity with only modest accuracy. It is used primarily to identify changes within a particular patient. It must be understood that outpatient pain scales are significantly less accurate that those used for research, where they can be applied under ideal controlled circumstances with minimal exposure to variables. In reality, the score is likely to be a combination of pain intensity and pain affect, where pain affect describes the degree of emotional arousal or changes in action readiness caused by the sensory experience of pain. Factors such as social and work situation, setting, emotional state, anxiety levels, expectation, and prior pain experience may influence pain perception and show large inter-individual differences that may also be affected by time  variables.  Patient instructions provided during this appointment: Patient Instructions   ____________________________________________________________________________________________  Medication Rules  Applies to: All patients receiving prescriptions (written or electronic).  Pharmacy of record: Pharmacy where electronic prescriptions will be sent. If written prescriptions are taken to a different pharmacy, please inform the nursing staff. The pharmacy listed in the electronic medical record should be the one where you would like electronic prescriptions to be sent.  Prescription refills: Only during scheduled appointments. Applies to both, written and electronic prescriptions.  NOTE: The following applies primarily to controlled substances (Opioid* Pain Medications).   Patient's responsibilities: 1. Pain Pills: Bring all pain  pills to every appointment (except for procedure appointments). 2. Pill Bottles: Bring pills in original pharmacy bottle. Always bring newest bottle. Bring bottle, even if empty. 3. Medication refills: You are responsible for knowing and keeping track of what medications you need refilled. The day before your appointment, write a list of all prescriptions that need to be refilled. Bring that list to your appointment and give it to the admitting nurse. Prescriptions will be written only during appointments. If you forget a medication, it will not be "Called in", "Faxed", or "electronically sent". You will need to get another appointment to get these prescribed. 4. Prescription Accuracy: You are responsible for carefully inspecting your prescriptions before leaving our office. Have the discharge nurse carefully go over each prescription with you, before taking them home. Make sure that your name is accurately spelled, that your address is correct. Check the name and dose of your medication to make sure it is accurate. Check the number of pills, and the written instructions  to make sure they are clear and accurate. Make sure that you are given enough medication to last until your next medication refill appointment. 5. Taking Medication: Take medication as prescribed. Never take more pills than instructed. Never take medication more frequently than prescribed. Taking less pills or less frequently is permitted and encouraged, when it comes to controlled substances (written prescriptions).  6. Inform other Doctors: Always inform, all of your healthcare providers, of all the medications you take. 7. Pain Medication from other Providers: You are not allowed to accept any additional pain medication from any other Doctor or Healthcare provider. There are two exceptions to this rule. (see below) In the event that you require additional pain medication, you are responsible for notifying us, as stated below. 8. Medication Agreement: You are responsible for carefully reading and following our Medication Agreement. This must be signed before receiving any prescriptions from our practice. Safely store a copy of your signed Agreement. Violations to the Agreement will result in no further prescriptions. (Additional copies of our Medication Agreement are available upon request.) 9. Laws, Rules, & Regulations: All patients are expected to follow all Federal and [employer], [employer], Rules, [employer]. Ignorance of the Laws does not constitute a valid excuse. The use of any illegal substances is prohibited. 10. Adopted CDC guidelines & recommendations: Target dosing levels will be at or below 60 MME/day. Use of benzodiazepines** is not recommended.  Exceptions: There are only two exceptions to the rule of not receiving pain medications from other Healthcare Providers. 1. Exception #1 (Emergencies): In the event of an emergency (i.e.: accident requiring emergency care), you are allowed to receive additional pain medication. However, you are responsible for: As soon as you are able, call our  office [phone], at any time of the day or night, and leave a message stating your name, the date and nature of the emergency, and the name and dose of the medication prescribed. In the event that your call is answered by a member of our staff, make sure to document and save the date, time, and the name of the person that took your information.  2. Exception #2 (Planned Surgery): In the event that you are scheduled by another doctor or dentist to have any type of surgery or procedure, you are allowed (for a period no longer than 30 days), to receive additional pain medication, for the acute post-op pain. However, in this case, you are responsible for picking up a copy of our "Post-op  Pain Management for Surgeons" handout, and giving it to your surgeon or dentist. This document is available at our office, and does not require an appointment to obtain it. Simply go to our office during business hours (Monday-Thursday from 8:00 AM to 4:00 PM) (Friday 8:00 AM to 12:00 Noon) or if you have a scheduled appointment with [place], prior to your surgery, and ask for it by name. In addition, you will need to provide [place] with your name, name of your surgeon, type of surgery, and date of procedure or surgery.  *Opioid medications include: morphine, codeine, oxycodone, oxymorphone, hydrocodone, hydromorphone, meperidine, tramadol, tapentadol, buprenorphine, fentanyl, methadone. **Benzodiazepine medications include: diazepam (Valium), alprazolam (Xanax), clonazepam (Klonopine), lorazepam (Ativan), clorazepate (Tranxene), chlordiazepoxide (Librium), estazolam (Prosom), oxazepam (Serax), temazepam (Restoril), triazolam (Halcion) (Last updated: 11/27/2017) ____________________________________________________________________________________________    BMI Assessment: Estimated body mass index is 42.97 kg/m as calculated from the following:   Height as of this encounter: 5' (1.524 m).   Weight as of this encounter: 220 lb  (99.8 kg).  BMI interpretation table: BMI level Category Range association with higher incidence of chronic pain  <18 kg/m2 Underweight   18.5-24.9 kg/m2 Ideal body weight   25-29.9 kg/m2 Overweight Increased incidence by 20%  30-34.9 kg/m2 Obese (Class I) Increased incidence by 68%  35-39.9 kg/m2 Severe obesity (Class II) Increased incidence by 136%  >40 kg/m2 Extreme obesity (Class III) Increased incidence by 254%   Patient's current BMI Ideal Body weight  Body mass index is 42.97 kg/m. Ideal body weight: 45.5 kg (100 lb 4.9 oz) Adjusted ideal body weight: 67.2 kg (148 lb 3 oz)   BMI Readings from Last 4 Encounters:  03/05/18 42.97 kg/m  01/05/18 37.38 kg/m  12/25/17 37.73 kg/m  12/19/17 37.91 kg/m   Wt Readings from Last 4 Encounters:  03/05/18 220 lb (99.8 kg)  01/05/18 211 lb (95.7 kg)  12/25/17 213 lb (96.6 kg)  12/19/17 214 lb (97.1 kg)   You were given two prescriptions for Hydrocodone, one each for Ducosate and Lyrica today.

## 2018-03-06 ENCOUNTER — Other Ambulatory Visit: Payer: Self-pay | Admitting: Family Medicine

## 2018-03-06 ENCOUNTER — Ambulatory Visit (INDEPENDENT_AMBULATORY_CARE_PROVIDER_SITE_OTHER): Payer: Self-pay | Admitting: Nurse Practitioner

## 2018-03-06 ENCOUNTER — Encounter: Payer: Self-pay | Admitting: Nurse Practitioner

## 2018-03-06 VITALS — BP 110/70 | HR 90 | Resp 16 | Ht 63.0 in | Wt 219.5 lb

## 2018-03-06 DIAGNOSIS — Z6838 Body mass index (BMI) 38.0-38.9, adult: Secondary | ICD-10-CM

## 2018-03-06 DIAGNOSIS — E782 Mixed hyperlipidemia: Secondary | ICD-10-CM

## 2018-03-06 DIAGNOSIS — M25561 Pain in right knee: Secondary | ICD-10-CM

## 2018-03-06 LAB — VITAMIN D 25 HYDROXY (VIT D DEFICIENCY, FRACTURES): Vit D, 25-Hydroxy: 15 ng/mL — ABNORMAL LOW (ref 30–100)

## 2018-03-06 LAB — LIPID PANEL
Cholesterol: 206 mg/dL — ABNORMAL HIGH (ref <200)
HDL: 38 mg/dL — ABNORMAL LOW (ref >50)
NON-HDL CHOLESTEROL (CALC): 168 mg/dL — AB (ref <130)
Total CHOL/HDL Ratio: 5.4 (calc) — ABNORMAL HIGH (ref <5.0)
Triglycerides: 441 mg/dL — ABNORMAL HIGH (ref <150)

## 2018-03-06 MED ORDER — VITAMIN D (ERGOCALCIFEROL) 1.25 MG (50000 UNIT) PO CAPS: 50000.0000 [IU] | ORAL_CAPSULE | ORAL | 1 refills | Status: DC

## 2018-03-06 MED ORDER — ROSUVASTATIN CALCIUM 20 MG PO TABS: 20.0000 mg | ORAL_TABLET | Freq: Every day | ORAL | 0 refills | Status: DC

## 2018-03-06 NOTE — Progress Notes (Addendum)
Name: Dana Bradley   MRN: [id number]    DOB: December 27, 1970   Date:03/06/2018       Progress Note  Subjective  Chief Complaint  Chief Complaint  Patient presents with  . Referral    would like a referral to [facility]    HPI  Patient endorses right knee pain ongoing x 6 weeks. States sometimes patella slides out of place and comes back into the right area. Patient knee surgery in 1998- had arthroscopic tendon resection and tibial tubercle bypass- done at [facility]. Patient did not have complications after surgery. Patient denies known injuries- falls, trauma. states does stumble occasionally. States pain worse when pivoting. Pain is intermittent cannot tell if it is worse with weight bearing. Cannot identify aggravating factors. States takes hydrocodone rx for chronic back pain brings some relief to knee pain but doesn't take it for knee pain takes it for other pain but helps when she does. Would like referral to orthopedic.   Patient Active Problem List   Diagnosis Date Noted  . Occipital headache 12/25/2017  . Low serum cortisol level ([place]) 12/12/2017  . Chronic fatigue syndrome with fibromyalgia 12/12/2017  . Trigger point of shoulder region (Left) 11/17/2017  . Myofascial pain syndrome (Left) (trapezius muscle) 07/22/2017  . Lumbar L1-2 disc protrusion (Right) 04/07/2017  . Acute postoperative pain 04/07/2017  . Muscle spasticity 04/01/2017  . 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 Lumbar spondylosis 01/06/2017  . Chronic left hip pain 12/24/2016  . Chronic sacroiliac joint pain (Left) 12/24/2016  . Lumbar facet joint syndrome (B) (L>R) 12/24/2016  . Left lumbar radiculitis 12/24/2016  . Hypertriglyceridemia 11/27/2016  . History of vasovagal episode 10/30/2016  . Cervicogenic headache 09/09/2016  . Medication monitoring encounter 08/29/2016  . Controlled substance agreement signed 08/28/2016  . Plantar fasciitis of left foot 08/28/2016  . Vitamin B12 deficiency 08/28/2016   . Hyperlipidemia 08/28/2016  . Nephrolithiasis 08/12/2016  . Chronic pain syndrome 08/07/2016  . Long term prescription opiate use 08/07/2016  . Opiate use 08/07/2016  . Long term prescription benzodiazepine use 08/07/2016  . Neurogenic pain 08/07/2016  . Chronic low back pain (Primary Source of Pain) (Bilateral) (R>L) (midline) 08/07/2016  . Chronic upper back pain (Secondary source of pain) (Bilateral) (L>R) 08/07/2016  . Chronic abdominal pain (Right lower quadrant) 08/07/2016  . Thoracic radiculitis (Right) (T11 dermatome) 08/07/2016  . Chronic occipital neuralgia [facility] source of pain) (Bilateral) (L>R) 08/07/2016  . Chronic neck pain 08/07/2016  . Chronic cervical radicular pain (Bilateral) (L>R) 08/07/2016  . Chronic shoulder blade pain (Bilateral) (L>R) 08/07/2016  . Chronic upper extremity pain (Bilateral) (R>L) 08/07/2016  . Chronic knee pain (Bilateral) (R>L) 08/07/2016  . Chronic ankle pain (Bilateral) 08/07/2016  . Cervical spondylosis with myelopathy and radiculopathy 08/07/2016  . Panic disorder with agoraphobia 05/29/2016  . Depression, unspecified depression type 05/29/2016  . Atypical lymphocytosis 05/01/2016  . Vitamin D insufficiency 05/01/2016  . Chronic lower extremity cramps (Bilateral) (R>L) 04/29/2016  . Obesity 04/29/2016  . GAD (generalized anxiety disorder) 04/29/2016  . Fatigue 04/29/2016  . Insomnia 07/12/2015  . Migraine without aura and with status migrainosus, not intractable 07/12/2015  . Chronic superficial gastritis 06/02/2015  . Chronic pain of multiple joints 05/15/2015  . Bilateral leg edema 05/02/2015  . Paroxysmal supraventricular tachycardia ([place]) 04/17/2015  . Exertional shortness of breath 04/17/2015  . Bright red rectal bleeding 04/06/2015  . DDD (degenerative disc disease), lumbosacral 01/24/2014  . DDD (degenerative disc disease), lumbar 01/24/2014  . Cervico-occipital  neuralgia 12/29/2013  . Fibromyalgia 12/29/2013  .  Migraine headache 12/29/2013  . Menorrhagia 12/10/2012  . Depression, major, recurrent, in remission ([place]) 01/12/2009  . Chest pain 01/12/2009  . Hypertension, benign essential, goal below 140/90 06/23/2008  . History of PSVT (paroxysmal supraventricular tachycardia) 06/17/2008  . Obstructive sleep apnea, adult 06/17/2008  . GERD 06/13/2008    Past Medical History:  Diagnosis Date  . Acute postoperative pain 04/07/2017  . Anxiety   . Bursitis   . Chronic fatigue 12/12/2017  . Edema leg 05/02/2015  . Fibromyalgia   . GERD (gastroesophageal reflux disease)   . IBS (irritable bowel syndrome)   . Knee pain, bilateral 12/21/2008   Qualifier: Diagnosis of  By: Hassell Done FNP, Tori Milks    . Lumbar discitis   . Osteoarthritis   . Right hand pain 04/10/2015   [facility] has done nerve conduction studies and ruled out carpal tunnel.   . Sleep apnea   . SVT (supraventricular tachycardia) ([place])   . Vertigo   . Vitamin D deficiency 05/01/2016    Past Surgical History:  Procedure Laterality Date  . ABLATION     Uterine  . CARDIAC CATHETERIZATION     with ablation  . COLONOSCOPY WITH PROPOFOL N/A 05/17/2015   Procedure: COLONOSCOPY WITH PROPOFOL;  Surgeon: Manya Silvas, MD;  Location: [facility] ENDOSCOPY;  Service: Endoscopy;  Laterality: N/A;  . ESOPHAGOGASTRODUODENOSCOPY N/A 05/17/2015   Procedure: ESOPHAGOGASTRODUODENOSCOPY (EGD);  Surgeon: Manya Silvas, MD;  Location: [facility] ENDOSCOPY;  Service: Endoscopy;  Laterality: N/A;  . KNEE ARTHROSCOPY    . spg     6/18  . TUBAL LIGATION  10/01/99    Social History   Tobacco Use  . Smoking status: Former Smoker    Packs/day: 4.00    Years: 3.00    Pack years: 12.00    Types: Cigarettes    Last attempt to quit: 12/10/1992    Years since quitting: 25.2  . Smokeless tobacco: Never Used  . Tobacco comment: quit 25 years ago  Substance Use Topics  . Alcohol use: No    Alcohol/week: 0.0 oz     Current Outpatient Medications:  .   acetaminophen (TYLENOL) 325 MG tablet, Take 650 mg by mouth as needed. , Disp: , Rfl:  .  Calcium-Vitamin D-Vitamin K 962-229-79 MG-UNT-MCG TABS, Take 1 tablet by mouth daily. , Disp: , Rfl:  .  clonazePAM (KLONOPIN) 0.5 MG tablet, Take 1 tablet (0.5 mg total) by mouth 2 (two) times daily as needed for anxiety (only for severe panic sx)., Disp: 60 tablet, Rfl: 0 .  Cyanocobalamin (VITAMIN B-12) 500 MCG SUBL, Place 3 tablets (1,500 mcg total) under the tongue daily., Disp: 150 tablet, Rfl:  .  docusate sodium (COLACE) 100 MG capsule, Take 1 capsule (100 mg total) by mouth 2 (two) times daily., Disp: 60 capsule, Rfl: 2 .  HYDROcodone-acetaminophen (NORCO) 7.5-325 MG tablet, Take 1 tablet by mouth every 6 (six) hours as needed for severe pain., Disp: 30 tablet, Rfl: 0 .  HYDROcodone-acetaminophen (NORCO) 7.5-325 MG tablet, Take 1 tablet by mouth every 6 (six) hours as needed for moderate pain., Disp: 30 tablet, Rfl: 0 .  metoprolol tartrate (LOPRESSOR) 50 MG tablet, TAKE 1 TABLET BY MOUTH TWICE A DAY, Disp: 180 tablet, Rfl: 3 .  omeprazole (PRILOSEC) 20 MG capsule, Take 1 capsule (20 mg total) by mouth daily., Disp: 30 capsule, Rfl: 0 .  ondansetron (ZOFRAN) 4 MG tablet, Take 1 tablet (4 mg total) every 8 (  eight) hours as needed by mouth., Disp: 20 tablet, Rfl: 2 .  pregabalin (LYRICA) 150 MG capsule, Take 1 capsule (150 mg total) by mouth every 8 (eight) hours., Disp: 90 capsule, Rfl: 2 .  rosuvastatin (CRESTOR) 20 MG tablet, Take 1 tablet (20 mg total) by mouth at bedtime. For cholesterol, Disp: 90 tablet, Rfl: 0 .  Vitamin D, Ergocalciferol, (DRISDOL) 50000 units CAPS capsule, Take 1 capsule (50,000 Units total) by mouth every 7 (seven) days., Disp: 4 capsule, Rfl: 1 .  baclofen (LIORESAL) 10 MG tablet, Take 1 tablet (10 mg total) 3 (three) times daily by mouth., Disp: 90 tablet, Rfl: 2 .  diclofenac sodium (VOLTAREN) 1 % GEL, Apply 2 g topically 4 (four) times daily. (Patient taking differently: Apply  2 g topically as needed. ), Disp: 1 Tube, Rfl: 2  Allergies  Allergen Reactions  . Aspirin Swelling  . Cymbalta [Duloxetine Hcl] Other (See Comments)    Suicidal ideations and has homicidal thoughts per patient  . Depakote [Divalproex Sodium] Shortness Of Breath    W/ n/v  . Duloxetine Other (See Comments)    Suicidal idealation  . Haloperidol Shortness Of Breath    W/ n/v  . Iodinated Diagnostic Agents Other (See Comments)  . Iodine Shortness Of Breath  . Meperidine Nausea And Vomiting    Patient projectile vomits and usually result in ER  . Reglan [Metoclopramide] Shortness Of Breath    Can't breath, wheezes  . Tramadol Hcl Palpitations    Severely and adversely affects her SVT giving her tachycardias of 150-160 bpm.  . Trazodone Shortness Of Breath  . Compazine [Prochlorperazine] Other (See Comments)    Panic attack  . Meloxicam Other (See Comments)    mouth sores, tingling, blisters in mouth  . Penicillins Rash  . Tomato Hives    Tongue will blister  . Other   . Shellfish Allergy     Other reaction(s): Unknown  . Shellfish-Derived Products Other (See Comments)  . Bacitracin-Neomycin-Polymyxin Rash  . Cephalosporins Rash    rash  . Ibuprofen Other (See Comments) and Rash    Blisters in mouth. Blisters in mouth.  . Latex Itching  . Neosporin [Neomycin-Bacitracin Zn-Polymyx] Rash  . Nsaids Other (See Comments)    Blisters in mouth; can take 1 ibuprofen 2x a month  . Sulfa Antibiotics Rash    rash Other reaction(s): Unknown rash   . Sulfasalazine Rash  . Sulfonamide Derivatives Rash    ROS    No other specific complaints in a complete review of systems (except as listed in HPI above).  Objective  Vitals:   03/06/18 1507 03/06/18 1508  BP:  110/70  Pulse:  90  Resp:  16  SpO2:  98%  Weight:  219 lb 8 oz (99.6 kg)  Height: 5\' 3"  (1.6 m) 5\' 3"  (1.6 m)     Body mass index is 38.88 kg/m.  Nursing Note and Vital Signs reviewed.  Physical Exam   Musculoskeletal:       Right knee: She exhibits normal range of motion, no swelling, no effusion, no deformity, no laceration, no erythema and no bony tenderness. Tenderness found. Medial joint line tenderness noted.    Constitutional: Patient appears well-developed and well-nourished. Obese No distress.  Cardiovascular: Normal rate, regular rhythm, S1/S2 present.   Pulmonary/Chest: Effort normal and breath sounds clear. No respiratory distress or retractions. Psychiatric: Patient has a normal mood and affect. behavior is normal. Judgment and thought content normal.  Assessment & Plan  1. Acute pain of right knee Knee sleeve, cont pain management regimen, ice/heat  - Ambulatory referral to Orthopedic Surgery  2. Morbid obesity ([place]) -discussed diet, limited exercise due to pain -BMI greater than 35 + HTN, GERD, sleep apneas, exertional shob - Amb ref to Medical Nutrition Therapy-MNT  3. BMI 38.0-38.9,adult See above  - Amb ref to Medical Nutrition Therapy-MNT   - Follow up and care instructions discussed and provided in AVS.   ------------------------------------- I have reviewed this encounter including the documentation in this note and/or discussed this patient with the provider, Suezanne Cheshire DNP AGNP-C. I am certifying that I agree with the content of this note as supervising physician. Enid Derry, [facility] 03/06/2018, 5:50 PM

## 2018-03-06 NOTE — Patient Instructions (Addendum)
Knee Pain, Adult Many things can cause knee pain. The pain often goes away on its own with time and rest. If the pain does not go away, tests may be done to find out what is causing the pain. Follow these instructions at home: Activity  Rest your knee.  Do not do things that cause pain.  Avoid activities where both feet leave the ground at the same time (high-impact activities). Examples are running, jumping rope, and doing jumping jacks. General instructions  Take medicines only as told by your doctor.  Raise (elevate) your knee when you are resting. Make sure your knee is higher than your heart.  Sleep with a pillow under your knee.  If told, put ice on the knee: ? Put ice in a plastic bag. ? Place a towel between your skin and the bag. ? Leave the ice on for 20 minutes, 2-3 times a day.  Ask your doctor if you should wear an elastic knee support.  Lose weight if you are overweight. Being overweight can make your knee hurt more.  Do not use any tobacco products. These include cigarettes, chewing tobacco, or electronic cigarettes. If you need help quitting, ask your doctor. Smoking may slow down healing. Contact a doctor if:  The pain does not stop.  The pain changes or gets worse.  You have a fever along with knee pain.  Your knee gives out or locks up.  Your knee swells, and becomes worse. Get help right away if:  Your knee feels warm.  You cannot move your knee.  You have very bad knee pain.  You have chest pain.  You have trouble breathing. Summary  Many things can cause knee pain. The pain often goes away on its own with time and rest.  Avoid activities that put stress on your knee. These include running and jumping rope.  Get help right away if you cannot move your knee, or if your knee feels warm, or if you have trouble breathing. This information is not intended to replace advice given to you by your health care provider. Make sure you discuss any  questions you have with your health care provider. Document Released: 12/13/2008 Document Revised: 09/10/2016 Document Reviewed: 09/10/2016 Elsevier Interactive Patient Education  2017 [employer] Calories are units of energy. Your body needs a certain amount of calories from food to keep you going throughout the day. When you eat more calories than your body needs, your body stores the extra calories as fat. When you eat fewer calories than your body needs, your body burns fat to get the energy it needs. Calorie counting means keeping track of how many calories you eat and drink each day. Calorie counting can be helpful if you need to lose weight. If you make sure to eat fewer calories than your body needs, you should lose weight. Ask your health care provider what a healthy weight is for you. For calorie counting to work, you will need to eat the right number of calories in a day in order to lose a healthy amount of weight per week. A dietitian can help you determine how many calories you need in a day and will give you suggestions on how to reach your calorie goal.  A healthy amount of weight to lose per week is usually 1-2 lb (0.5-0.9 kg). This usually means that your daily calorie intake should be reduced by 500-750 calories.  Eating 1,200 - 1,500 calories per  day can help most women lose weight.  Eating 1,500 - 1,800 calories per day can help most men lose weight.  What is my plan? My goal is to have __________ calories per day. If I have this many calories per day, I should lose around __________ pounds per week. What do I need to know about calorie counting? In order to meet your daily calorie goal, you will need to:  Find out how many calories are in each food you would like to eat. Try to do this before you eat.  Decide how much of the food you plan to eat.  Write down what you ate and how many calories it had. Doing this is called keeping a  food log.  To successfully lose weight, it is important to balance calorie counting with a healthy lifestyle that includes regular activity. Aim for 150 minutes of moderate exercise (such as walking) or 75 minutes of vigorous exercise (such as running) each week. Where do I find calorie information?  The number of calories in a food can be found on a Nutrition Facts label. If a food does not have a Nutrition Facts label, try to look up the calories online or ask your dietitian for help. Remember that calories are listed per serving. If you choose to have more than one serving of a food, you will have to multiply the calories per serving by the amount of servings you plan to eat. For example, the label on a package of bread might say that a serving size is 1 slice and that there are 90 calories in a serving. If you eat 1 slice, you will have eaten 90 calories. If you eat 2 slices, you will have eaten 180 calories. How do I keep a food log? Immediately after each meal, record the following information in your food log:  What you ate. Don't forget to include toppings, sauces, and other extras on the food.  How much you ate. This can be measured in cups, ounces, or number of items.  How many calories each food and drink had.  The total number of calories in the meal.  Keep your food log near you, such as in a small notebook in your pocket, or use a mobile app or website. Some programs will calculate calories for you and show you how many calories you have left for the day to meet your goal. What are some calorie counting tips?  Use your calories on foods and drinks that will fill you up and not leave you hungry: ? Some examples of foods that fill you up are nuts and nut butters, vegetables, lean proteins, and high-fiber foods like whole grains. High-fiber foods are foods with more than 5 g fiber per serving. ? Drinks such as sodas, specialty coffee drinks, alcohol, and juices have a lot of  calories, yet do not fill you up.  Eat nutritious foods and avoid empty calories. Empty calories are calories you get from foods or beverages that do not have many vitamins or protein, such as candy, sweets, and soda. It is better to have a nutritious high-calorie food (such as an avocado) than a food with few nutrients (such as a bag of chips).  Know how many calories are in the foods you eat most often. This will help you calculate calorie counts faster.  Pay attention to calories in drinks. Low-calorie drinks include water and unsweetened drinks.  Pay attention to nutrition labels for "low fat" or "fat free" foods.  These foods sometimes have the same amount of calories or more calories than the full fat versions. They also often have added sugar, starch, or salt, to make up for flavor that was removed with the fat.  Find a way of tracking calories that works for you. Get creative. Try different apps or programs if writing down calories does not work for you. What are some portion control tips?  Know how many calories are in a serving. This will help you know how many servings of a certain food you can have.  Use a measuring cup to measure serving sizes. You could also try weighing out portions on a kitchen scale. With time, you will be able to estimate serving sizes for some foods.  Take some time to put servings of different foods on your favorite plates, bowls, and cups so you know what a serving looks like.  Try not to eat straight from a bag or box. Doing this can lead to overeating. Put the amount you would like to eat in a cup or on a plate to make sure you are eating the right portion.  Use smaller plates, glasses, and bowls to prevent overeating.  Try not to multitask (for example, watch TV or use your computer) while eating. If it is time to eat, sit down at a table and enjoy your food. This will help you to know when you are full. It will also help you to be aware of what you are  eating and how much you are eating. What are tips for following this plan? Reading food labels  Check the calorie count compared to the serving size. The serving size may be smaller than what you are used to eating.  Check the source of the calories. Make sure the food you are eating is high in vitamins and protein and low in saturated and trans fats. Shopping  Read nutrition labels while you shop. This will help you make healthy decisions before you decide to purchase your food.  Make a grocery list and stick to it. Cooking  Try to cook your favorite foods in a healthier way. For example, try baking instead of frying.  Use low-fat dairy products. Meal planning  Use more fruits and vegetables. Half of your plate should be fruits and vegetables.  Include lean proteins like poultry and fish. How do I count calories when eating out?  Ask for smaller portion sizes.  Consider sharing an entree and sides instead of getting your own entree.  If you get your own entree, eat only half. Ask for a box at the beginning of your meal and put the rest of your entree in it so you are not tempted to eat it.  If calories are listed on the menu, choose the lower calorie options.  Choose dishes that include vegetables, fruits, whole grains, low-fat dairy products, and lean protein.  Choose items that are boiled, broiled, grilled, or steamed. Stay away from items that are buttered, battered, fried, or served with cream sauce. Items labeled "crispy" are usually fried, unless stated otherwise.  Choose water, low-fat milk, unsweetened iced tea, or other drinks without added sugar. If you want an alcoholic beverage, choose a lower calorie option such as a glass of wine or light beer.  Ask for dressings, sauces, and syrups on the side. These are usually high in calories, so you should limit the amount you eat.  If you want a salad, choose a garden salad and ask for grilled  meats. Avoid extra toppings  like bacon, cheese, or fried items. Ask for the dressing on the side, or ask for olive oil and vinegar or lemon to use as dressing.  Estimate how many servings of a food you are given. For example, a serving of cooked rice is  cup or about the size of half a baseball. Knowing serving sizes will help you be aware of how much food you are eating at restaurants. The list below tells you how big or small some common portion sizes are based on everyday objects: ? 1 oz-4 stacked dice. ? 3 oz-1 deck of cards. ? 1 tsp-1 die. ? 1 Tbsp- a ping-pong ball. ? 2 Tbsp-1 ping-pong ball. ?  cup- baseball. ? 1 cup-1 baseball. Summary  Calorie counting means keeping track of how many calories you eat and drink each day. If you eat fewer calories than your body needs, you should lose weight.  A healthy amount of weight to lose per week is usually 1-2 lb (0.5-0.9 kg). This usually means reducing your daily calorie intake by 500-750 calories.  The number of calories in a food can be found on a Nutrition Facts label. If a food does not have a Nutrition Facts label, try to look up the calories online or ask your dietitian for help.  Use your calories on foods and drinks that will fill you up, and not on foods and drinks that will leave you hungry.  Use smaller plates, glasses, and bowls to prevent overeating. This information is not intended to replace advice given to you by your health care provider. Make sure you discuss any questions you have with your health care provider. Document Released: 09/16/2005 Document Revised: 08/16/2016 Document Reviewed: 08/16/2016 Elsevier Interactive Patient Education  [employer].

## 2018-03-06 NOTE — Progress Notes (Signed)
Increase statin; add krill oil BID; recheck lipids in 6-8 weeks

## 2018-03-10 LAB — TOXASSURE SELECT 13 (MW), URINE

## 2018-03-19 ENCOUNTER — Encounter: Payer: Self-pay | Admitting: Pain Medicine

## 2018-03-19 ENCOUNTER — Ambulatory Visit: Payer: Medicaid Other | Attending: Pain Medicine | Admitting: Pain Medicine

## 2018-03-19 ENCOUNTER — Other Ambulatory Visit: Payer: Self-pay

## 2018-03-19 VITALS — BP 118/75 | HR 67 | Temp 98.4°F | Resp 16 | Ht 64.0 in | Wt 220.0 lb

## 2018-03-19 DIAGNOSIS — M7918 Myalgia, other site: Secondary | ICD-10-CM

## 2018-03-19 DIAGNOSIS — M4802 Spinal stenosis, cervical region: Secondary | ICD-10-CM | POA: Insufficient documentation

## 2018-03-19 DIAGNOSIS — M5126 Other intervertebral disc displacement, lumbar region: Secondary | ICD-10-CM | POA: Diagnosis not present

## 2018-03-19 DIAGNOSIS — M25512 Pain in left shoulder: Secondary | ICD-10-CM | POA: Diagnosis not present

## 2018-03-19 DIAGNOSIS — M48061 Spinal stenosis, lumbar region without neurogenic claudication: Secondary | ICD-10-CM | POA: Diagnosis not present

## 2018-03-19 DIAGNOSIS — M542 Cervicalgia: Secondary | ICD-10-CM | POA: Diagnosis present

## 2018-03-19 DIAGNOSIS — M50222 Other cervical disc displacement at C5-C6 level: Secondary | ICD-10-CM | POA: Diagnosis not present

## 2018-03-19 MED ORDER — TRIAMCINOLONE ACETONIDE 40 MG/ML IJ SUSP
INTRAMUSCULAR | Status: AC
  Filled 2018-03-19: qty 1

## 2018-03-19 MED ORDER — TRIAMCINOLONE ACETONIDE 40 MG/ML IJ SUSP
40.0000 mg | Freq: Once | INTRAMUSCULAR | Status: AC
  Administered 2018-03-19: 40 mg

## 2018-03-19 MED ORDER — ROPIVACAINE HCL 2 MG/ML IJ SOLN
9.0000 mL | Freq: Once | INTRAMUSCULAR | Status: AC
  Administered 2018-03-19: 10 mL

## 2018-03-19 MED ORDER — LIDOCAINE HCL 2 % IJ SOLN
INTRAMUSCULAR | Status: AC
  Filled 2018-03-19: qty 20

## 2018-03-19 MED ORDER — ROPIVACAINE HCL 2 MG/ML IJ SOLN
INTRAMUSCULAR | Status: AC
  Filled 2018-03-19: qty 10

## 2018-03-19 MED ORDER — LIDOCAINE HCL 2 % IJ SOLN
20.0000 mL | Freq: Once | INTRAMUSCULAR | Status: AC
  Administered 2018-03-19: 400 mg

## 2018-03-19 NOTE — Patient Instructions (Signed)

## 2018-03-19 NOTE — Progress Notes (Signed)
Patient's Name: Dana Bradley  MRN: [id number]  Referring Provider: Arnetha Courser, MD  DOB: 1971/02/01  PCP: Arnetha Courser, MD  DOS: 03/19/2018  Note by: Gaspar Cola, MD  Service setting: Ambulatory outpatient  Specialty: Interventional Pain Management  Patient type: Established  Location: [facility] (AMB) Pain Management Facility  Visit type: Interventional Procedure   Primary Reason for Visit: Interventional Pain Management Treatment. CC: Neck Pain (lower)  Procedure:          Anesthesia, Analgesia, Anxiolysis:  Type: Trigger Point Injection (1-2 muscle groups) CPT: 20552 Primary Purpose: Therapeutic Region: Posterolateral Cervicothoracic Level: Cervico-thoracic Target Area: Trapezius muscle Trigger Point Approach: Percutaneous, ipsilateral approach. Laterality: Left-Sided       Position: Sitting  Type: Local Anesthesia Indication(s): Analgesia         Local Anesthetic: Lidocaine 1-2% Route: Infiltration (/IM) IV Access: Declined Sedation: Declined    Indications: 1. Trigger point of shoulder region (Left)   2. Trigger point of neck (Left)   3. Myofascial pain syndrome (Left) (trapezius muscle)    Pain Score: Pre-procedure: 3 /10 Post-procedure: 0-No pain/10  Today the patient comes in for a palliative left-sided trapezius muscle trigger point injection. She has had these in the past with good success. The last time she had done was on 11/18/2017. On 12/16/2017 she had a right sided L1-2 LESI and this was followed on 12/25/2017 by a left sided TON + C2 RFA, and a left sided greater occipital nerve RFA. On follow-up on 01/05/2018, the patient was still having 100% relief of the left-sided occipital pain.  Unfortunately, she has multiple problems that tend to cause her pain to recur. In the case of the low back pain, she has a lumbar MRI with evidence of a right sided L1 to disc protrusion and mild central spinal stenosis of 9 mm. She also has evidence of a small central disc  protrusion at the L4-5 level with an annular fissure/tear and a central canal ensuring 11 mm in AP diameter. At the L5-S1 level she has mild DDD but no other findings. For the cervical spine, the x-rays done in 2017 were negative however, she does have an MRI that was done in 2016 demonstrating a C5-6 right-sided disc protrusion but with left-sided facet disease. At the C6-7 level she has a left sided cervical disc protrusion, but no other pathology.  Pre-op Assessment:  Dana Bradley is a 48 y.o. (year old), female patient, seen today for interventional treatment. She  has a past surgical history that includes Ablation; Tubal ligation (10/01/99); Cardiac catheterization; Knee arthroscopy; Colonoscopy with propofol (N/A, 05/17/2015); Esophagogastroduodenoscopy (N/A, 05/17/2015); and spg. Dana Bradley has a current medication list which includes the following prescription(s): acetaminophen, calcium-vitamin d-vitamin k, clonazepam, vitamin b-12, docusate sodium, hydrocodone-acetaminophen, hydrocodone-acetaminophen, metoprolol tartrate, omeprazole, ondansetron, pregabalin, rosuvastatin, vitamin d (ergocalciferol), baclofen, and diclofenac sodium. Her primarily concern today is the Neck Pain (lower)  Initial Vital Signs:  Pulse/HCG Rate: 70  Temp: 98.4 F (36.9 C) Resp: 16 BP: 114/78 SpO2: 100 %  BMI: Estimated body mass index is 37.76 kg/m as calculated from the following:   Height as of this encounter: 5\' 4"  (1.626 m).   Weight as of this encounter: 220 lb (99.8 kg).  Risk Assessment: Allergies: Reviewed. She is allergic to aspirin; cymbalta [duloxetine hcl]; depakote [divalproex sodium]; duloxetine; haloperidol; iodinated diagnostic agents; iodine; meperidine; reglan [metoclopramide]; tramadol hcl; trazodone; compazine [prochlorperazine]; meloxicam; penicillins; tomato; other; shellfish allergy; shellfish-derived products; bacitracin-neomycin-polymyxin; cephalosporins; ibuprofen; latex; neosporin  [neomycin-bacitracin zn-polymyx];  nsaids; sulfa antibiotics; sulfasalazine; and sulfonamide derivatives.  Allergy Precautions: Latex-free protocol activated. No iodine containing solutions or radiological contrast used. Coagulopathies: Reviewed. None identified.  Blood-thinner therapy: None at this time Active Infection(s): Reviewed. None identified. Dana Bradley is afebrile  Site Confirmation: Dana Bradley was asked to confirm the procedure and laterality before marking the site Procedure checklist: Completed Consent: Before the procedure and under the influence of no sedative(s), amnesic(s), or anxiolytics, the patient was informed of the treatment options, risks and possible complications. To fulfill our ethical and legal obligations, as recommended by the American Medical Association's Code of Ethics, I have informed the patient of my clinical impression; the nature and purpose of the treatment or procedure; the risks, benefits, and possible complications of the intervention; the alternatives, including doing nothing; the risk(s) and benefit(s) of the alternative treatment(s) or procedure(s); and the risk(s) and benefit(s) of doing nothing. The patient was provided information about the general risks and possible complications associated with the procedure. These may include, but are not limited to: failure to achieve desired goals, infection, bleeding, organ or nerve damage, allergic reactions, paralysis, and death. In addition, the patient was informed of those risks and complications associated to the procedure, such as failure to decrease pain; infection; bleeding; organ or nerve damage with subsequent damage to sensory, motor, and/or autonomic systems, resulting in permanent pain, numbness, and/or weakness of one or several areas of the body; allergic reactions; (i.e.: anaphylactic reaction); and/or death. Furthermore, the patient was informed of those risks and complications associated with the  medications. These include, but are not limited to: allergic reactions (i.e.: anaphylactic or anaphylactoid reaction(s)); adrenal axis suppression; blood sugar elevation that in diabetics may result in ketoacidosis or comma; water retention that in patients with history of congestive heart failure may result in shortness of breath, pulmonary edema, and decompensation with resultant heart failure; weight gain; swelling or edema; medication-induced neural toxicity; particulate matter embolism and blood vessel occlusion with resultant organ, and/or nervous system infarction; and/or aseptic necrosis of one or more joints. Finally, the patient was informed that Medicine is not an exact science; therefore, there is also the possibility of unforeseen or unpredictable risks and/or possible complications that may result in a catastrophic outcome. The patient indicated having understood very clearly. We have given the patient no guarantees and we have made no promises. Enough time was given to the patient to ask questions, all of which were answered to the patient's satisfaction. Ms. Lasota has indicated that she wanted to continue with the procedure. Attestation: I, the ordering provider, attest that I have discussed with the patient the benefits, risks, side-effects, alternatives, likelihood of achieving goals, and potential problems during recovery for the procedure that I have provided informed consent. Date  Time: 03/19/2018 10:26 AM  Pre-Procedure Preparation:  Monitoring: As per clinic protocol. Respiration, ETCO2, SpO2, BP, heart rate and rhythm monitor placed and checked for adequate function Safety Precautions: Patient was assessed for positional comfort and pressure points before starting the procedure. Time-out: I initiated and conducted the "Time-out" before starting the procedure, as per protocol. The patient was asked to participate by confirming the accuracy of the "Time Out" information. Verification  of the correct person, site, and procedure were performed and confirmed by me, the nursing staff, and the patient. "Time-out" conducted as per Joint Commission's Universal Protocol (UP.01.01.01). Time: 1102  Description of Procedure:          Area Prepped: Entire  Region Prepping solution: ChloraPrep (2% chlorhexidine gluconate and 70% isopropyl alcohol) Safety Precautions: Aspiration looking for blood return was conducted prior to all injections. At no point did we inject any substances, as a needle was being advanced. No attempts were made at seeking any paresthesias. Safe injection practices and needle disposal techniques used. Medications properly checked for expiration dates. SDV (single dose vial) medications used. Description of the Procedure: Protocol guidelines were followed. The patient was placed in position over the fluoroscopy table. The target area was identified and the area prepped in the usual manner. Skin & deeper tissues infiltrated with local anesthetic. Appropriate amount of time allowed to pass for local anesthetics to take effect. The procedure needles were then advanced to the target area. Proper needle placement secured. Negative aspiration confirmed. Solution injected in intermittent fashion, asking for systemic symptoms every 0.5cc of injectate. The needles were then removed and the area cleansed, making sure to leave some of the prepping solution back to take advantage of its long term bactericidal properties.  Vitals:   03/19/18 1022 03/19/18 1113  BP: 114/78 118/75  Pulse: 70 67  Resp:  16  Temp: 98.4 F (36.9 C)   SpO2: 100% 99%  Weight: 220 lb (99.8 kg)   Height: 5\' 4"  (1.626 m)     Start Time: 1102 hrs. End Time:   hrs. Materials:  Needle(s) Type: Epidural needle Gauge: 20G Length: 3.5-in Medication(s): Please see orders for medications and dosing details.  Imaging Guidance:          Type of Imaging Technique: None used Indication(s):  N/A Exposure Time: No patient exposure Contrast: None used. Fluoroscopic Guidance: N/A Ultrasound Guidance: N/A Interpretation: N/A  Antibiotic Prophylaxis:   Anti-infectives (From admission, onward)   None     Indication(s): None identified  Post-operative Assessment:  Post-procedure Vital Signs:  Pulse/HCG Rate: 67  Temp: 98.4 F (36.9 C) Resp: 16 BP: 118/75 SpO2: 99 %  EBL: None  Complications: No immediate post-treatment complications observed by team, or reported by patient.  Note: The patient tolerated the entire procedure well. A repeat set of vitals were taken after the procedure and the patient was kept under observation following institutional policy, for this type of procedure. Post-procedural neurological assessment was performed, showing return to baseline, prior to discharge. The patient was provided with post-procedure discharge instructions, including a section on how to identify potential problems. Should any problems arise concerning this procedure, the patient was given instructions to immediately contact us, at any time, without hesitation. In any case, we plan to contact the patient by telephone for a follow-up status report regarding this interventional procedure.  Comments:  No additional relevant information.  Plan of Care   Imaging Orders  No imaging studies ordered today    Procedure Orders     TRIGGER POINT INJECTION  Medications ordered for procedure: Meds ordered this encounter  Medications  . lidocaine (XYLOCAINE) 2 % (with pres) injection 400 mg  . triamcinolone acetonide (KENALOG-40) injection 40 mg  . ropivacaine (PF) 2 mg/mL (0.2%) (NAROPIN) injection 9 mL   Medications administered: We administered lidocaine, triamcinolone acetonide, and ropivacaine (PF) 2 mg/mL (0.2%).  See the medical record for exact dosing, route, and time of administration.  New Prescriptions   No medications on file   Disposition: Discharge home   Discharge Date & Time: 03/19/2018; 1113 hrs.   Physician-requested Follow-up: Return for post-procedure eval (2 wks), w/ Dionisio David, NP.  Future Appointments  Date Time Provider [place]  03/31/2018  8:45 AM Ursula Alert, MD [facility] None  04/07/2018  8:30 AM Vevelyn Francois, NP [facility] None   Primary Care Physician: Arnetha Courser, MD Location: [facility] Note by: Gaspar Cola, MD Date: 03/19/2018; Time: 12:34 PM  Disclaimer:  Medicine is not an exact science. The only guarantee in medicine is that nothing is guaranteed. It is important to note that the decision to proceed with this intervention was based on the information collected from the patient. The Data and conclusions were drawn from the patient's questionnaire, the interview, and the physical examination. Because the information was provided in large part by the patient, it cannot be guaranteed that it has not been purposely or unconsciously manipulated. Every effort has been made to obtain as much relevant data as possible for this evaluation. It is important to note that the conclusions that lead to this procedure are derived in large part from the available data. Always take into account that the treatment will also be dependent on availability of resources and existing treatment guidelines, considered by other Pain Management Practitioners as being common knowledge and practice, at the time of the intervention. For Medico-Legal purposes, it is also important to point out that variation in procedural techniques and pharmacological choices are the acceptable norm. The indications, contraindications, technique, and results of the above procedure should only be interpreted and judged by a Board-Certified Interventional Pain Specialist with extensive familiarity and expertise in the same exact procedure and technique.

## 2018-03-20 ENCOUNTER — Telehealth: Payer: Self-pay | Admitting: *Deleted

## 2018-03-20 NOTE — Telephone Encounter (Signed)
Attempted to call for post procedure follow-up. Message left. 

## 2018-03-24 DIAGNOSIS — M25561 Pain in right knee: Secondary | ICD-10-CM | POA: Diagnosis not present

## 2018-03-24 DIAGNOSIS — M179 Osteoarthritis of knee, unspecified: Secondary | ICD-10-CM | POA: Diagnosis not present

## 2018-03-26 ENCOUNTER — Other Ambulatory Visit: Payer: Self-pay | Admitting: Psychiatry

## 2018-03-26 DIAGNOSIS — F410 Panic disorder [episodic paroxysmal anxiety] without agoraphobia: Secondary | ICD-10-CM

## 2018-03-26 DIAGNOSIS — F411 Generalized anxiety disorder: Secondary | ICD-10-CM

## 2018-03-26 DIAGNOSIS — F331 Major depressive disorder, recurrent, moderate: Secondary | ICD-10-CM

## 2018-03-31 ENCOUNTER — Ambulatory Visit: Payer: Medicaid Other | Admitting: Psychiatry

## 2018-03-31 ENCOUNTER — Telehealth: Payer: Self-pay | Admitting: Psychiatry

## 2018-03-31 MED ORDER — OXCARBAZEPINE 150 MG PO TABS: 150.0000 mg | ORAL_TABLET | Freq: Two times a day (BID) | ORAL | 1 refills | Status: DC

## 2018-03-31 MED ORDER — VENLAFAXINE HCL ER 75 MG PO CP24: 75.0000 mg | ORAL_CAPSULE | Freq: Every day | ORAL | 1 refills | Status: DC

## 2018-03-31 NOTE — Telephone Encounter (Signed)
Pt missed her appointment today. Sent refills for effexor and trileptal to her pharmacy since [place] will not be in clinic for few weeks in July.

## 2018-04-03 ENCOUNTER — Telehealth: Payer: Self-pay | Admitting: Psychiatry

## 2018-04-03 NOTE — Telephone Encounter (Signed)
Called patient to discuss her refill . Pt states she has not been taking any medications since last few months due to not having a health insurance plan. Provided her [facility] information. Also discussed to talk to Janett Billow or Lea about  assistance.

## 2018-04-06 ENCOUNTER — Encounter: Payer: Self-pay | Admitting: Nurse Practitioner

## 2018-04-07 ENCOUNTER — Other Ambulatory Visit: Payer: Self-pay

## 2018-04-07 ENCOUNTER — Encounter: Payer: Self-pay | Admitting: Nurse Practitioner

## 2018-04-07 ENCOUNTER — Ambulatory Visit: Payer: Self-pay | Attending: Nurse Practitioner | Admitting: Nurse Practitioner

## 2018-04-07 VITALS — BP 121/67 | HR 67 | Temp 97.7°F | Ht 63.0 in | Wt 220.0 lb

## 2018-04-07 DIAGNOSIS — M5116 Intervertebral disc disorders with radiculopathy, lumbar region: Secondary | ICD-10-CM | POA: Insufficient documentation

## 2018-04-07 DIAGNOSIS — M47816 Spondylosis without myelopathy or radiculopathy, lumbar region: Secondary | ICD-10-CM

## 2018-04-07 DIAGNOSIS — M79602 Pain in left arm: Secondary | ICD-10-CM | POA: Insufficient documentation

## 2018-04-07 DIAGNOSIS — E559 Vitamin D deficiency, unspecified: Secondary | ICD-10-CM | POA: Insufficient documentation

## 2018-04-07 DIAGNOSIS — F411 Generalized anxiety disorder: Secondary | ICD-10-CM | POA: Insufficient documentation

## 2018-04-07 DIAGNOSIS — K219 Gastro-esophageal reflux disease without esophagitis: Secondary | ICD-10-CM | POA: Insufficient documentation

## 2018-04-07 DIAGNOSIS — E538 Deficiency of other specified B group vitamins: Secondary | ICD-10-CM | POA: Insufficient documentation

## 2018-04-07 DIAGNOSIS — R5382 Chronic fatigue, unspecified: Secondary | ICD-10-CM | POA: Insufficient documentation

## 2018-04-07 DIAGNOSIS — M25552 Pain in left hip: Secondary | ICD-10-CM | POA: Insufficient documentation

## 2018-04-07 DIAGNOSIS — F4001 Agoraphobia with panic disorder: Secondary | ICD-10-CM | POA: Insufficient documentation

## 2018-04-07 DIAGNOSIS — M5481 Occipital neuralgia: Secondary | ICD-10-CM | POA: Insufficient documentation

## 2018-04-07 DIAGNOSIS — M25571 Pain in right ankle and joints of right foot: Secondary | ICD-10-CM | POA: Insufficient documentation

## 2018-04-07 DIAGNOSIS — I471 Supraventricular tachycardia: Secondary | ICD-10-CM | POA: Insufficient documentation

## 2018-04-07 DIAGNOSIS — Z881 Allergy status to other antibiotic agents status: Secondary | ICD-10-CM | POA: Insufficient documentation

## 2018-04-07 DIAGNOSIS — Z882 Allergy status to sulfonamides status: Secondary | ICD-10-CM | POA: Insufficient documentation

## 2018-04-07 DIAGNOSIS — G894 Chronic pain syndrome: Secondary | ICD-10-CM | POA: Insufficient documentation

## 2018-04-07 DIAGNOSIS — Z87891 Personal history of nicotine dependence: Secondary | ICD-10-CM | POA: Insufficient documentation

## 2018-04-07 DIAGNOSIS — Z880 Allergy status to penicillin: Secondary | ICD-10-CM | POA: Insufficient documentation

## 2018-04-07 DIAGNOSIS — G4700 Insomnia, unspecified: Secondary | ICD-10-CM | POA: Insufficient documentation

## 2018-04-07 DIAGNOSIS — M5137 Other intervertebral disc degeneration, lumbosacral region: Secondary | ICD-10-CM | POA: Insufficient documentation

## 2018-04-07 DIAGNOSIS — Z888 Allergy status to other drugs, medicaments and biological substances status: Secondary | ICD-10-CM | POA: Insufficient documentation

## 2018-04-07 DIAGNOSIS — F329 Major depressive disorder, single episode, unspecified: Secondary | ICD-10-CM | POA: Insufficient documentation

## 2018-04-07 DIAGNOSIS — M79601 Pain in right arm: Secondary | ICD-10-CM | POA: Insufficient documentation

## 2018-04-07 DIAGNOSIS — M19012 Primary osteoarthritis, left shoulder: Secondary | ICD-10-CM | POA: Insufficient documentation

## 2018-04-07 DIAGNOSIS — M25572 Pain in left ankle and joints of left foot: Secondary | ICD-10-CM | POA: Insufficient documentation

## 2018-04-07 DIAGNOSIS — E781 Pure hyperglyceridemia: Secondary | ICD-10-CM | POA: Insufficient documentation

## 2018-04-07 DIAGNOSIS — Z886 Allergy status to analgesic agent status: Secondary | ICD-10-CM | POA: Insufficient documentation

## 2018-04-07 DIAGNOSIS — G4733 Obstructive sleep apnea (adult) (pediatric): Secondary | ICD-10-CM | POA: Insufficient documentation

## 2018-04-07 DIAGNOSIS — I10 Essential (primary) hypertension: Secondary | ICD-10-CM | POA: Insufficient documentation

## 2018-04-07 DIAGNOSIS — G8929 Other chronic pain: Secondary | ICD-10-CM

## 2018-04-07 DIAGNOSIS — M25562 Pain in left knee: Secondary | ICD-10-CM | POA: Insufficient documentation

## 2018-04-07 DIAGNOSIS — R519 Headache, unspecified: Secondary | ICD-10-CM

## 2018-04-07 DIAGNOSIS — G4486 Cervicogenic headache: Secondary | ICD-10-CM

## 2018-04-07 DIAGNOSIS — R51 Headache: Secondary | ICD-10-CM | POA: Insufficient documentation

## 2018-04-07 DIAGNOSIS — M19011 Primary osteoarthritis, right shoulder: Secondary | ICD-10-CM | POA: Insufficient documentation

## 2018-04-07 DIAGNOSIS — Z79899 Other long term (current) drug therapy: Secondary | ICD-10-CM | POA: Insufficient documentation

## 2018-04-07 DIAGNOSIS — M4712 Other spondylosis with myelopathy, cervical region: Secondary | ICD-10-CM | POA: Insufficient documentation

## 2018-04-07 DIAGNOSIS — M533 Sacrococcygeal disorders, not elsewhere classified: Secondary | ICD-10-CM | POA: Insufficient documentation

## 2018-04-07 DIAGNOSIS — Z885 Allergy status to narcotic agent status: Secondary | ICD-10-CM | POA: Insufficient documentation

## 2018-04-07 DIAGNOSIS — M542 Cervicalgia: Secondary | ICD-10-CM

## 2018-04-07 DIAGNOSIS — M25561 Pain in right knee: Secondary | ICD-10-CM | POA: Insufficient documentation

## 2018-04-07 DIAGNOSIS — K589 Irritable bowel syndrome without diarrhea: Secondary | ICD-10-CM | POA: Insufficient documentation

## 2018-04-07 DIAGNOSIS — M4726 Other spondylosis with radiculopathy, lumbar region: Secondary | ICD-10-CM | POA: Insufficient documentation

## 2018-04-07 DIAGNOSIS — M797 Fibromyalgia: Secondary | ICD-10-CM | POA: Insufficient documentation

## 2018-04-07 NOTE — Patient Instructions (Addendum)
  ____________________________________________________________________________________________  Appointment Policy Summary  It is our goal and responsibility to provide the medical community with assistance in the evaluation and management of patients with chronic pain. Unfortunately our resources are limited. Because we do not have an unlimited amount of time, or available appointments, we are required to closely monitor and manage their use. The following rules exist to maximize their use:  Patient's responsibilities: 1. Punctuality:  At what time should I arrive? You should be physically present in our office 30 minutes before your scheduled appointment. Your scheduled appointment is with your assigned healthcare provider. However, it takes 5-10 minutes to be "checked-in", and another 15 minutes for the nurses to do the admission. If you arrive to our office at the time you were given for your appointment, you will end up being at least 20-25 minutes late to your appointment with the provider. 2. Tardiness:  What happens if I arrive only a few minutes after my scheduled appointment time? You will need to reschedule your appointment. The cutoff is your appointment time. This is why it is so important that you arrive at least 30 minutes before that appointment. If you have an appointment scheduled for 10:00 AM and you arrive at 10:01, you will be required to reschedule your appointment.  3. Plan ahead:  Always assume that you will encounter traffic on your way in. Plan for it. If you are dependent on a driver, make sure they understand these rules and the need to arrive early. 4. Other appointments and responsibilities:  Avoid scheduling any other appointments before or after your pain clinic appointments.  5. Be prepared:  Write down everything that you need to discuss with your healthcare provider and give this information to the admitting nurse. Write down the medications that you will need  refilled. Bring your pills and bottles (even the empty ones), to all of your appointments, except for those where a procedure is scheduled. 6. No children or pets:  Find someone to take care of them. It is not appropriate to bring them in. 7. Scheduling changes:  We request "advanced notification" of any changes or cancellations. 8. Advanced notification:  Defined as a time period of more than 24 hours prior to the originally scheduled appointment. This allows for the appointment to be offered to other patients. 9. Rescheduling:  When a visit is rescheduled, it will require the cancellation of the original appointment. For this reason they both fall within the category of "Cancellations".  10. Cancellations:  They require advanced notification. Any cancellation less than 24 hours before the  appointment will be recorded as a "No Show". 11. No Show:  Defined as an unkept appointment where the patient failed to notify or declare to the practice their intention or inability to keep the appointment.  Corrective process for repeat offenders:  1. Tardiness: Three (3) episodes of rescheduling due to late arrivals will be recorded as one (1) "No Show". 2. Cancellation or reschedule: Three (3) cancellations or rescheduling will be recorded as one (1) "No Show". 3. "No Shows": Three (3) "No Shows" within a 12 month period will result in discharge from the practice.  ____________________________________________________________________________________________   

## 2018-04-07 NOTE — Progress Notes (Signed)
Patient's Name: Dana Bradley  MRN: [id number]  Referring Provider: Arnetha Courser, MD  DOB: 1971/01/29  PCP: Arnetha Courser, MD  DOS: 04/07/2018  Note by: Vevelyn Francois NP  Service setting: Ambulatory outpatient  Specialty: Interventional Pain Management  Location: [facility] (AMB) Pain Management Facility    Patient type: Established    Primary Reason(s) for Visit: Encounter for prescription drug management & post-procedure evaluation of chronic illness with mild to moderate exacerbation(Level of risk: moderate) CC: Back Pain (lower)  HPI  Dana Bradley is a 47 y.o. year old, female patient, who comes today for a post-procedure evaluation and medication management. She has Depression, major, recurrent, in remission ([place]); Hypertension, benign essential, goal below 140/90; History of PSVT (paroxysmal supraventricular tachycardia); GERD; Chest pain; Obstructive sleep apnea, adult; Menorrhagia; Cervico-occipital neuralgia; DDD (degenerative disc disease), lumbosacral; Paroxysmal supraventricular tachycardia ([place]); Exertional shortness of breath; Chronic pain of multiple joints; Chronic superficial gastritis; DDD (degenerative disc disease), lumbar; Fibromyalgia; Insomnia; Migraine without aura and with status migrainosus, not intractable; Chronic lower extremity cramps (Bilateral) (R>L); Obesity; GAD (generalized anxiety disorder); Fatigue; Atypical lymphocytosis; Vitamin D insufficiency; Panic disorder with agoraphobia; Depression, unspecified depression type; Chronic pain syndrome; Long term prescription opiate use; Opiate use; Long term prescription benzodiazepine use; Neurogenic pain; Chronic low back pain (Primary Source of Pain) (Bilateral) (R>L) (midline); Chronic upper back pain (Secondary source of pain) (Bilateral) (L>R); Chronic abdominal pain (Right lower quadrant); Thoracic radiculitis (Right) (T11 dermatome); Chronic occipital neuralgia [facility] source of pain) (Bilateral) (L>R); Chronic neck pain;  Chronic cervical radicular pain (Bilateral) (L>R); Chronic shoulder blade pain (Bilateral) (L>R); Chronic upper extremity pain (Bilateral) (R>L); Chronic knee pain (Bilateral) (R>L); Chronic ankle pain (Bilateral); Cervical spondylosis with myelopathy and radiculopathy; Nephrolithiasis; Controlled substance agreement signed; Plantar fasciitis of left foot; Vitamin B12 deficiency; Hyperlipidemia; Medication monitoring encounter; Cervicogenic headache; Bilateral leg edema; Bright red rectal bleeding; History of vasovagal episode; Hypertriglyceridemia; Chronic left hip pain; Chronic sacroiliac joint pain (Left); Lumbar facet joint syndrome (B) (L>R); Left lumbar radiculitis; Lumbar spondylosis; Migraine headache; Muscle spasticity; Osteoarthritis of shoulder (B); Lumbar L1-2 disc protrusion (Right); Acute postoperative pain; Myofascial pain syndrome (Left) (trapezius muscle); Trigger point of shoulder region (Left); Low serum cortisol level ([place]); Chronic fatigue syndrome with fibromyalgia; Occipital headache; and Trigger point of neck (Left) on their problem list. Her primarily concern today is the Back Pain (lower)  Pain Assessment: Location: Lower Back Radiating: radiates down both hips and legs Onset: More than a month ago Duration: Chronic pain Quality: Discomfort, Pressure, Burning, Sharp, Constant Severity: 1 /10 (subjective, self-reported pain score)  Note: Reported level is compatible with observation.                         When using our objective Pain Scale, levels between 6 and 10/10 are said to belong in an emergency room, as it progressively worsens from a 6/10, described as severely limiting, requiring emergency care not usually available at an outpatient pain management facility. At a 6/10 level, communication becomes difficult and requires great effort. Assistance to reach the emergency department may be required. Facial flushing and profuse sweating along with potentially dangerous  increases in heart rate and blood pressure will be evident. Effect on ADL: limits activities Timing: Constant Modifying factors: lay down, change position, heat, back support, medication last choice BP: 121/67  HR: 67  Dana Bradley was last seen on 03/20/2018 for a procedure. During today's appointment we reviewed Dana Bradley's post-procedure results, as  well as her outpatient medication regimen. She feels like the trigger points were effective for her treatment. She feels like the cervical RFA has really started to work. She has gone without 17 days with out a migraine.  She would like this same help with her back. She is going to hold off on any further treatment until her insurance or assistance is approved.   Further details on both, my assessment(s), as well as the proposed treatment plan, please see below.   Post-Procedure Assessment  03/19/2018 Procedure: Left shoulder Trigger point Pre-procedure pain score:  3/10 Post-procedure pain score: 0/10         Influential Factors: BMI: 38.97 kg/m Intra-procedural challenges: None observed.         Assessment challenges: None detected.              Reported side-effects: None.        Post-procedural adverse reactions or complications: None reported         Sedation: Please see nurses note. When no sedatives are used, the analgesic levels obtained are directly associated to the effectiveness of the local anesthetics. However, when sedation is provided, the level of analgesia obtained during the initial 1 hour following the intervention, is believed to be the result of a combination of factors. These factors may include, but are not limited to: 1. The effectiveness of the local anesthetics used. 2. The effects of the analgesic(s) and/or anxiolytic(s) used. 3. The degree of discomfort experienced by the patient at the time of the procedure. 4. The patients ability and reliability in recalling and recording the events. 5. The presence and influence  of possible secondary gains and/or psychosocial factors. Reported result: Relief experienced during the 1st hour after the procedure: 100 % (Ultra-Short Term Relief)            Interpretative annotation: Clinically appropriate result. Analgesia during this period is likely to be Local Anesthetic and/or IV Sedative (Analgesic/Anxiolytic) related.          Effects of local anesthetic: The analgesic effects attained during this period are directly associated to the localized infiltration of local anesthetics and therefore cary significant diagnostic value as to the etiological location, or anatomical origin, of the pain. Expected duration of relief is directly dependent on the pharmacodynamics of the local anesthetic used. Long-acting (4-6 hours) anesthetics used.  Reported result: Relief during the next 4 to 6 hour after the procedure: 80 % (Short-Term Relief)            Interpretative annotation: Clinically appropriate result. Analgesia during this period is likely to be Local Anesthetic-related.          Long-term benefit: Defined as the period of time past the expected duration of local anesthetics (1 hour for short-acting and 4-6 hours for long-acting). With the possible exception of prolonged sympathetic blockade from the local anesthetics, benefits during this period are typically attributed to, or associated with, other factors such as analgesic sensory neuropraxia, antiinflammatory effects, or beneficial biochemical changes provided by agents other than the local anesthetics.  Reported result: Extended relief following procedure: 90 % (Long-Term Relief)            Interpretative annotation: Clinically appropriate result. Good relief. No permanent benefit expected. Inflammation plays a part in the etiology to the pain.          Current benefits: Defined as reported results that persistent at this point in time.   Analgesia: >75 %  Function: Dana Bradley reports improvement in function ROM:  Dana Bradley reports improvement in ROM Interpretative annotation: Good relief.    Effective diagnostic intervention.          Interpretation: Results would suggest a successful diagnostic intervention.                  Plan:  Please see "Plan of Care" for details.                Laboratory Chemistry  Inflammation Markers (CRP: Acute Phase) (ESR: Chronic Phase) Lab Results  Component Value Date   CRP 4.5 05/02/2017   ESRSEDRATE 11 04/01/2017                         Rheumatology Markers Lab Results  Component Value Date   RF <14 12/15/2017   ANA NEGATIVE 12/15/2017                        Renal Function Markers Lab Results  Component Value Date   BUN 13 11/24/2017   CREATININE 0.80 40/34/7425   BCR NOT APPLICABLE 95/63/8756   GFRAA 102 11/24/2017   GFRNONAA 88 11/24/2017                             Hepatic Function Markers Lab Results  Component Value Date   AST 14 11/24/2017   ALT 21 11/24/2017   ALBUMIN 4.1 04/01/2017   ALKPHOS 84 04/01/2017                        Electrolytes Lab Results  Component Value Date   NA 141 11/24/2017   K 4.2 11/24/2017   CL 105 11/24/2017   CALCIUM 9.8 11/24/2017   MG 2.1 04/01/2017                        Neuropathy Markers Lab Results  Component Value Date   VITAMINB12 638 11/24/2017   HIV NON REAC 01/12/2009                        Bone Pathology Markers Lab Results  Component Value Date   VD25OH 15 (L) 03/05/2018   25OHVITD1 20 (L) 04/01/2017   25OHVITD2 5.4 04/01/2017   25OHVITD3 15 04/01/2017                         Coagulation Parameters Lab Results  Component Value Date   INR 1.01 04/23/2010   LABPROT 13.2 04/23/2010   PLT 299 11/24/2017   DDIMER 0.57 (H) 05/02/2017                        Cardiovascular Markers Lab Results  Component Value Date   BNP 14.2 05/02/2017   CKTOTAL 40 12/15/2017   TROPONINI <0.03 05/02/2017   HGB 13.9 11/24/2017   HCT 39.2 11/24/2017                         CA  Markers No results found for: CEA, CA125, LABCA2                      Note: Lab results reviewed.  Recent Diagnostic Imaging Results  NM Myocar Multi W/Spect W/Wall Motion / EF  Blood pressure demonstrated a normal response to exercise.  There was no ST segment deviation noted during stress.  No T wave inversion was noted during stress.  The study is normal.  This is a low risk study.  The left ventricular ejection fraction is normal (55-65%).    Complexity Note: Imaging results reviewed. Results shared with Dana Bradley, using Layman's terms.                         Meds   Current Outpatient Medications:  .  acetaminophen (TYLENOL) 325 MG tablet, Take 650 mg by mouth as needed. , Disp: , Rfl:  .  Calcium-Vitamin D-Vitamin K 563-875-64 MG-UNT-MCG TABS, Take 1 tablet by mouth daily. , Disp: , Rfl:  .  clonazePAM (KLONOPIN) 0.5 MG tablet, Take 1 tablet (0.5 mg total) by mouth 2 (two) times daily as needed for anxiety (only for severe panic sx)., Disp: 60 tablet, Rfl: 0 .  Cyanocobalamin (VITAMIN B-12) 500 MCG SUBL, Place 3 tablets (1,500 mcg total) under the tongue daily., Disp: 150 tablet, Rfl:  .  docusate sodium (COLACE) 100 MG capsule, Take 1 capsule (100 mg total) by mouth 2 (two) times daily., Disp: 60 capsule, Rfl: 2 .  HYDROcodone-acetaminophen (NORCO) 7.5-325 MG tablet, Take 1 tablet by mouth every 6 (six) hours as needed for severe pain., Disp: 30 tablet, Rfl: 0 .  magnesium oxide (MAG-OX) 400 MG tablet, Take 400 mg by mouth daily., Disp: , Rfl:  .  metoprolol tartrate (LOPRESSOR) 50 MG tablet, TAKE 1 TABLET BY MOUTH TWICE A DAY, Disp: 180 tablet, Rfl: 3 .  omeprazole (PRILOSEC) 20 MG capsule, Take 1 capsule (20 mg total) by mouth daily., Disp: 30 capsule, Rfl: 0 .  ondansetron (ZOFRAN) 4 MG tablet, Take 1 tablet (4 mg total) every 8 (eight) hours as needed by mouth., Disp: 20 tablet, Rfl: 2 .  pregabalin (LYRICA) 150 MG capsule, Take 1 capsule (150 mg total) by mouth every  8 (eight) hours., Disp: 90 capsule, Rfl: 2 .  rosuvastatin (CRESTOR) 20 MG tablet, Take 1 tablet (20 mg total) by mouth at bedtime. For cholesterol, Disp: 90 tablet, Rfl: 0 .  Vitamin D, Ergocalciferol, (DRISDOL) 50000 units CAPS capsule, Take 1 capsule (50,000 Units total) by mouth every 7 (seven) days., Disp: 4 capsule, Rfl: 1 .  baclofen (LIORESAL) 10 MG tablet, Take 1 tablet (10 mg total) 3 (three) times daily by mouth., Disp: 90 tablet, Rfl: 2 .  diclofenac sodium (VOLTAREN) 1 % GEL, Apply 2 g topically 4 (four) times daily. (Patient taking differently: Apply 2 g topically as needed. ), Disp: 1 Tube, Rfl: 2 .  HYDROcodone-acetaminophen (NORCO) 7.5-325 MG tablet, Take 1 tablet by mouth every 6 (six) hours as needed for moderate pain., Disp: 30 tablet, Rfl: 0  ROS  Constitutional: Denies any fever or chills Gastrointestinal: No reported hemesis, hematochezia, vomiting, or acute GI distress Musculoskeletal: Denies any acute onset joint swelling, redness, loss of ROM, or weakness Neurological: No reported episodes of acute onset apraxia, aphasia, dysarthria, agnosia, amnesia, paralysis, loss of coordination, or loss of consciousness  Allergies  Dana Bradley is allergic to aspirin; cymbalta [duloxetine hcl]; depakote [divalproex sodium]; duloxetine; haloperidol; iodinated diagnostic agents; iodine; meperidine; reglan [metoclopramide]; tramadol hcl; trazodone; compazine [prochlorperazine]; meloxicam; penicillins; tomato; other; shellfish allergy; shellfish-derived products; bacitracin-neomycin-polymyxin; cephalosporins; ibuprofen; latex; neosporin [neomycin-bacitracin zn-polymyx]; nsaids; sulfa antibiotics; sulfasalazine; and sulfonamide derivatives.  PFSH  Drug: Dana Bradley  reports that she does not use drugs.  Alcohol:  reports that she does not drink alcohol. Tobacco:  reports that she quit smoking about 25 years ago. Her smoking use included cigarettes. She has a 12.00 pack-year smoking history. She  has never used smokeless tobacco. Medical:  has a past medical history of Acute postoperative pain (04/07/2017), Anxiety, Bursitis, Chronic fatigue (12/12/2017), Edema leg (05/02/2015), Fibromyalgia, GERD (gastroesophageal reflux disease), IBS (irritable bowel syndrome), Knee pain, bilateral (12/21/2008), Lumbar discitis, Osteoarthritis, Right hand pain (04/10/2015), Sleep apnea, SVT (supraventricular tachycardia) ([place]), Vertigo, and Vitamin D deficiency (05/01/2016). Surgical: Dana Bradley  has a past surgical history that includes Ablation; Tubal ligation (10/01/99); Cardiac catheterization; Knee arthroscopy; Colonoscopy with propofol (N/A, 05/17/2015); Esophagogastroduodenoscopy (N/A, 05/17/2015); and spg. Family: family history includes Alcohol abuse in her father; Alzheimer's disease in her paternal grandmother and unknown relative; Aneurysm in her maternal grandfather; Anxiety disorder in her father, mother, sister, and sister; Arthritis in her maternal grandmother; Bipolar disorder in her sister; COPD in her paternal grandfather; Cancer in her mother; Cancer (age of onset: 73) in her maternal grandmother; Depression in her father, mother, sister, and sister; Diabetes in her sister; Heart attack in her paternal grandfather; Heart disease in her father, maternal grandfather, and paternal grandfather; Hyperlipidemia in her maternal grandmother, mother, and sister; Hypertension in her father, maternal grandfather, mother, paternal grandfather, sister, and sister; Polycystic ovary syndrome in her sister; Stroke in her father; Thyroid disease in her maternal grandmother.  Constitutional Exam  General appearance: Well nourished, well developed, and well hydrated. In no apparent acute distress Vitals:   04/07/18 0834  BP: 121/67  Pulse: 67  Temp: 97.7 F (36.5 C)  SpO2: 99%  Weight: 220 lb (99.8 kg)  Height: _0  (1.6 m)   BMI Assessment: Estimated body mass index is 38.97 kg/m as calculated from the  following:   Height as of this encounter: _1  (1.6 m).   Weight as of this encounter: 220 lb (99.8 kg).  BMI interpretation table: BMI level Category Range association with higher incidence of chronic pain  <18 kg/m2 Underweight   18.5-24.9 kg/m2 Ideal body weight   25-29.9 kg/m2 Overweight Increased incidence by 20%  30-34.9 kg/m2 Obese (Class I) Increased incidence by 68%  35-39.9 kg/m2 Severe obesity (Class II) Increased incidence by 136%  >40 kg/m2 Extreme obesity (Class III) Increased incidence by 254%   Patient's current BMI Ideal Body weight  Body mass index is 38.97 kg/m. Ideal body weight: 52.4 kg (115 lb 8.3 oz) Adjusted ideal body weight: 71.4 kg (157 lb 5 oz)   BMI Readings from Last 4 Encounters:  04/07/18 38.97 kg/m  03/19/18 37.76 kg/m  03/06/18 38.88 kg/m  03/05/18 42.97 kg/m   Wt Readings from Last 4 Encounters:  04/07/18 220 lb (99.8 kg)  03/19/18 220 lb (99.8 kg)  03/06/18 219 lb 8 oz (99.6 kg)  03/05/18 220 lb (99.8 kg)  Psych/Mental status: Alert, oriented x 3 (person, place, & time)       Eyes: PERLA Respiratory: No evidence of acute respiratory distress  Cervical Spine Area Exam  Skin & Axial Inspection: No masses, redness, edema, swelling, or associated skin lesions Alignment: Symmetrical Functional ROM: Unrestricted ROM      Stability: No instability detected Muscle Tone/Strength: Functionally intact. No obvious neuro-muscular anomalies detected. Sensory (Neurological): Unimpaired Palpation: No palpable anomalies              Upper Extremity (UE) Exam    Side: Right upper extremity  Side: Left upper extremity  Skin &  Extremity Inspection: Skin color, temperature, and hair growth are WNL. No peripheral edema or cyanosis. No masses, redness, swelling, asymmetry, or associated skin lesions. No contractures.  Skin & Extremity Inspection: Skin color, temperature, and hair growth are WNL. No peripheral edema or cyanosis. No masses, redness,  swelling, asymmetry, or associated skin lesions. No contractures.  Functional ROM: Unrestricted ROM          Functional ROM: Unrestricted ROM          Muscle Tone/Strength: Functionally intact. No obvious neuro-muscular anomalies detected.  Muscle Tone/Strength: Functionally intact. No obvious neuro-muscular anomalies detected.  Sensory (Neurological): Unimpaired          Sensory (Neurological): Unimpaired          Palpation: No palpable anomalies              Palpation: No palpable anomalies              Provocative Test(s):  Phalen's test: deferred Tinel's test: deferred Apley's scratch test (touch opposite shoulder):  Action 1 (Across chest): deferred Action 2 (Overhead): deferred Action 3 (LB reach): deferred   Provocative Test(s):  Phalen's test: deferred Tinel's test: deferred Apley's scratch test (touch opposite shoulder):  Action 1 (Across chest): deferred Action 2 (Overhead): deferred Action 3 (LB reach): deferred    Thoracic Spine Area Exam  Skin & Axial Inspection: No masses, redness, or swelling Alignment: Symmetrical Functional ROM: Unrestricted ROM Stability: No instability detected Muscle Tone/Strength: Functionally intact. No obvious neuro-muscular anomalies detected. Sensory (Neurological): Unimpaired Muscle strength & Tone: No palpable anomalies  Lumbar Spine Area Exam  Skin & Axial Inspection: No masses, redness, or swelling Alignment: Symmetrical Functional ROM: Unrestricted ROM       Stability: No instability detected Muscle Tone/Strength: Functionally intact. No obvious neuro-muscular anomalies detected. Sensory (Neurological): Unimpaired Palpation: No palpable anomalies       Provocative Tests: Lumbar Hyperextension/rotation test: deferred today       Lumbar quadrant test (Kemp's test): deferred today       Lumbar Lateral bending test: deferred today       Patrick's Maneuver: deferred today                   FABER test: deferred today        Thigh-thrust test: deferred today       S-I compression test: deferred today       S-I distraction test: deferred today        Gait & Posture Assessment  Ambulation: Unassisted Gait: Relatively normal for age and body habitus Posture: WNL   Lower Extremity Exam    Side: Right lower extremity  Side: Left lower extremity  Stability: No instability observed          Stability: No instability observed          Skin & Extremity Inspection: Skin color, temperature, and hair growth are WNL. No peripheral edema or cyanosis. No masses, redness, swelling, asymmetry, or associated skin lesions. No contractures.  Skin & Extremity Inspection: Skin color, temperature, and hair growth are WNL. No peripheral edema or cyanosis. No masses, redness, swelling, asymmetry, or associated skin lesions. No contractures.  Functional ROM: Unrestricted ROM                  Functional ROM: Unrestricted ROM                  Muscle Tone/Strength: Functionally intact. No obvious neuro-muscular anomalies detected.  Muscle Tone/Strength: Functionally  intact. No obvious neuro-muscular anomalies detected.  Sensory (Neurological): Unimpaired  Sensory (Neurological): Unimpaired  Palpation: No palpable anomalies  Palpation: No palpable anomalies   Assessment  Primary Diagnosis & Pertinent Problem List: The primary encounter diagnosis was Occipital headache. Diagnoses of Cervicogenic headache, Lumbar spondylosis, Chronic neck pain, and Chronic pain syndrome were also pertinent to this visit.  Status Diagnosis  Controlled Controlled Persistent 1. Occipital headache   2. Cervicogenic headache   3. Lumbar spondylosis   4. Chronic neck pain   5. Chronic pain syndrome     Problems updated and reviewed during this visit: No problems updated. Plan of Care  Pharmacotherapy (Medications Ordered): No orders of the defined types were placed in this encounter.  New Prescriptions   No medications on file   Medications  administered today: Dana Bradley had no medications administered during this visit. Lab-work, procedure(s), and/or referral(s): No orders of the defined types were placed in this encounter.  Imaging and/or referral(s): None  Interventional management options: Planned, scheduled, and/or pending: Not at this time.    Considering: Therapeutic left cervical epidural steroid injection Palliative left L4-5 lumbar epidural steroid injections Diagnostic bilateral lumbar facet block Possible bilateral lumbar facet radiofrequency ablation. Diagnostic thoracic facet block Possible bilateral thoracic facet radiofrequency ablation. Diagnostic bilateral cervical facet blocks Possible bilateral cervical facet radiofrequency ablation. Diagnostic bilateral lesser occipital nerve blocks Diagnostic bilateral C2 and TON nerve block Possible bilateral occipital nerve radiofrequency ablation. Diagnostic right T10-11 thoracic epidural steroid injection Diagnostic bilateral suprascapular nerve block Diagnostic bilateral intra-articular knee joint injection Possible bilateral intra-articular Hyalgan knee injections.  Diagnostic bilateral Genicular nerve block Possible bilateral Genicular nerve radiofrequency ablation.   Palliative PRN treatment(s): Palliative left trapezius muscle trigger point injection Palliative left L4-5 lumbar epidural steroid injections Diagnostic bilateral lumbar facet block Diagnostic thoracic facet block PalliativeLeft cervical epidural steroid injections Diagnostic bilateral cervical facet blocks Diagnostic bilateral lesser occipital nerve blocks Diagnostic bilateral C2 and TON nerve block Diagnostic right T10-11 thoracic epidural steroid injection Diagnostic bilateral suprascapular nerve block Diagnostic bilateral intra-articular knee joint injection Diagnostic bilateral Genicular nerve block      Provider-requested follow-up: No  follow-ups on file.  No future appointments. Primary Care Physician: Arnetha Courser, MD Location: [facility] Note by: Vevelyn Francois NP Date: 04/07/2018; Time: 9:30 AM  Pain Score Disclaimer: We use the NRS-11 scale. This is a self-reported, subjective measurement of pain severity with only modest accuracy. It is used primarily to identify changes within a particular patient. It must be understood that outpatient pain scales are significantly less accurate that those used for research, where they can be applied under ideal controlled circumstances with minimal exposure to variables. In reality, the score is likely to be a combination of pain intensity and pain affect, where pain affect describes the degree of emotional arousal or changes in action readiness caused by the sensory experience of pain. Factors such as social and work situation, setting, emotional state, anxiety levels, expectation, and prior pain experience may influence pain perception and show large inter-individual differences that may also be affected by time variables.  Patient instructions provided during this appointment: Patient Instructions  _____________________________________________________________________________________________  Appointment Policy Summary  It is our goal and responsibility to provide the medical community with assistance in the evaluation and management of patients with chronic pain. Unfortunately our resources are limited. Because we do not have an unlimited amount of time, or available appointments, we are required to closely monitor and manage their use.  The following rules exist to maximize their use:  Patient's responsibilities: 1. Punctuality:  At what time should I arrive? You should be physically present in our office 30 minutes before your scheduled appointment. Your scheduled appointment is with your assigned healthcare provider. However, it takes 5-10 minutes  to be "checked-in", and another 15 minutes for the nurses to do the admission. If you arrive to our office at the time you were given for your appointment, you will end up being at least 20-25 minutes late to your appointment with the provider. 2. Tardiness:  What happens if I arrive only a few minutes after my scheduled appointment time? You will need to reschedule your appointment. The cutoff is your appointment time. This is why it is so important that you arrive at least 30 minutes before that appointment. If you have an appointment scheduled for 10:00 AM and you arrive at 10:01, you will be required to reschedule your appointment.  3. Plan ahead:  Always assume that you will encounter traffic on your way in. Plan for it. If you are dependent on a driver, make sure they understand these rules and the need to arrive early. 4. Other appointments and responsibilities:  Avoid scheduling any other appointments before or after your pain clinic appointments.  5. Be prepared:  Write down everything that you need to discuss with your healthcare provider and give this information to the admitting nurse. Write down the medications that you will need refilled. Bring your pills and bottles (even the empty ones), to all of your appointments, except for those where a procedure is scheduled. 6. No children or pets:  Find someone to take care of them. It is not appropriate to bring them in. 7. Scheduling changes:  We request "advanced notification" of any changes or cancellations. 8. Advanced notification:  Defined as a time period of more than 24 hours prior to the originally scheduled appointment. This allows for the appointment to be offered to other patients. 9. Rescheduling:  When a visit is rescheduled, it will require the cancellation of the original appointment. For this reason they both fall within the category of "Cancellations".  10. Cancellations:  They require advanced notification. Any  cancellation less than 24 hours before the  appointment will be recorded as a "No Show". 11. No Show:  Defined as an unkept appointment where the patient failed to notify or declare to the practice their intention or inability to keep the appointment.  Corrective process for repeat offenders:  1. Tardiness: Three (3) episodes of rescheduling due to late arrivals will be recorded as one (1) "No Show". 2. Cancellation or reschedule: Three (3) cancellations or rescheduling will be recorded as one (1) "No Show". 3. "No Shows": Three (3) "No Shows" within a 12 month period will result in discharge from the practice. ____________________________________________________________________________________________

## 2018-07-30 ENCOUNTER — Encounter: Payer: Self-pay | Admitting: *Deleted

## 2018-07-30 ENCOUNTER — Encounter: Payer: Self-pay | Admitting: Family Medicine

## 2018-07-30 ENCOUNTER — Ambulatory Visit: Payer: Self-pay | Admitting: *Deleted

## 2018-07-30 ENCOUNTER — Ambulatory Visit: Payer: Self-pay | Admitting: Family Medicine

## 2018-07-30 VITALS — BP 124/70 | HR 82 | Temp 98.4°F | Resp 16 | Ht 63.0 in | Wt 213.8 lb

## 2018-07-30 DIAGNOSIS — Z598 Other problems related to housing and economic circumstances: Secondary | ICD-10-CM

## 2018-07-30 DIAGNOSIS — M797 Fibromyalgia: Secondary | ICD-10-CM

## 2018-07-30 DIAGNOSIS — Z599 Problem related to housing and economic circumstances, unspecified: Secondary | ICD-10-CM

## 2018-07-30 DIAGNOSIS — R5383 Other fatigue: Secondary | ICD-10-CM

## 2018-07-30 DIAGNOSIS — R21 Rash and other nonspecific skin eruption: Secondary | ICD-10-CM

## 2018-07-30 NOTE — Patient Instructions (Addendum)
1. Your nurse care manager will contact you regarding available food resources in your area.  2. Continue to reach out to the disability office to check on your disability appeal status.   Dana Bradley was given information about Care Management services today including:  1. Case Management services include personalized support from designated clinical staff supervised by a physician, including individualized plan of care and coordination with other care providers 2. 24/7 contact phone numbers for assistance for urgent and routine care needs. 3. The patient may stop CCM services at any time (effective at the end of the month) by phone call to the office staff.  Patient agreed to services and verbal consent obtained.  The patient verbalized understanding of instructions provided today and declined a print copy of patient instruction materials.   CCM (Chronic Care Management) Team   Trish Fountain RN, BSN Nurse Care Coordinator  [phone]  Ruben Reason PharmD  Clinical Pharmacist  [phone]

## 2018-07-30 NOTE — Progress Notes (Signed)
This encounter was created in error - please disregard.

## 2018-07-30 NOTE — Chronic Care Management (AMB) (Signed)
  Care Management   Initial Visit Note  07/30/2018 Name: Dana Bradley MRN: [id number] DOB: [birth year]  Referred by: Arnetha Courser, MD Reason for referral : Care Coordination (Initial Visit (Food Needs/Insurance REsources))   Subjective: "I need help getting food and I don't know if I'm doing everything I can to get insurance"  Assessment: Dana Bradley is a 47 year old female primary care patient of Dr. Enid Derry. Dana Bradley was referred by Dana Ensign FNP today to care management for assistance with health plan resources and to address food resource needs.   Goals    . "I need to get help finding food for my family" (pt-stated)     Care Coordination Needs around Food Resource Management - patient states that she and her family are experiencing difficulty being able to afford food routinely.   Clinical Goal(s): Over the next 7 days, patient will verbalize understanding of food resources in her geographic area  Interventions: Collaboration with LCSW in patient's geographic area to inquire about food resources    . I need help getting food for my family" (pt-stated)     Care Coordination Needs related to access to and understanding of health plan options - patient and spouse lost Medicaid coverage when youngest child turned 85. Patient has applied for disability, been denied, appealed and is awaiting response to appeal. Patient reports she was told she does not qualify for Medicaid. Patient has applied for charity care through health system and awaits notification re: her eligibility/acceptance.  Clinical Goal(s): Over the next 7 days, patient will verbalize understanding of options for insurance for self and family members  Interventions: Collaboration with [facility] re: options for insurance coverage and process for application   Plan: CM team will follow up with Dana Bradley over the next week.     Ms. Baucom was given information about Care Management services today  including:  1. Case Management services include personalized support from designated clinical staff supervised by a physician, including individualized plan of care and coordination with other care providers 2. 24/7 contact phone numbers for assistance for urgent and routine care needs. 3. The patient may stop CCM services at any time (effective at the end of the month) by phone call to the office staff.  Patient agreed to services and verbal consent obtained.  [facility] / [place] Management  [phone]

## 2018-07-30 NOTE — Progress Notes (Signed)
Name: Dana Bradley   MRN: [id number]    DOB: 1971/08/08   Date:07/30/2018       Progress Note  Subjective  Chief Complaint  Chief Complaint  Patient presents with  . Rash    on face    HPI  PT presents with concern for intermittent recurrent facial rash over the peaks of malar prominences. This occurs for several days in a row, then goes away. Over the last 10-15 years she has had redness, but in the last year this has been accompanied by overlying red bumps.  Rash is not itchy but does have some burning with it.  She has had negative ANA in the past; has had normal Sed rates in the past; has had elevated CRP in the past.   She also brings photos of previous transient raised scaling rashes - no current outbreak of this.   Sees Dr. Manuella Ghazi - for migraines, fatigue, and difficulty with ambulation.  Has not been able to return recently due to loss of her insurance.  She also has fibromyalgia and sees pain management regularly for this.  She is working on obtaining disability and charity care through [employer].  Will refer to Chronic Care Management for assistance.  Patient Active Problem List   Diagnosis Date Noted  . Trigger point of neck (Left) 03/19/2018  . Occipital headache 12/25/2017  . Low serum cortisol level ([place]) 12/12/2017  . Chronic fatigue syndrome with fibromyalgia 12/12/2017  . Trigger point of shoulder region (Left) 11/17/2017  . Myofascial pain syndrome (Left) (trapezius muscle) 07/22/2017  . Lumbar L1-2 disc protrusion (Right) 04/07/2017  . Acute postoperative pain 04/07/2017  . Muscle spasticity 04/01/2017  . 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 Lumbar spondylosis 01/06/2017  . Chronic left hip pain 12/24/2016  . Chronic sacroiliac joint pain (Left) 12/24/2016  . Lumbar facet joint syndrome (B) (L>R) 12/24/2016  . Left lumbar radiculitis 12/24/2016  . Hypertriglyceridemia 11/27/2016  . History of vasovagal episode 10/30/2016  . Cervicogenic headache  09/09/2016  . Medication monitoring encounter 08/29/2016  . Controlled substance agreement signed 08/28/2016  . Plantar fasciitis of left foot 08/28/2016  . Vitamin B12 deficiency 08/28/2016  . Hyperlipidemia 08/28/2016  . Nephrolithiasis 08/12/2016  . Chronic pain syndrome 08/07/2016  . Long term prescription opiate use 08/07/2016  . Opiate use 08/07/2016  . Long term prescription benzodiazepine use 08/07/2016  . Neurogenic pain 08/07/2016  . Chronic low back pain (Primary Source of Pain) (Bilateral) (R>L) (midline) 08/07/2016  . Chronic upper back pain (Secondary source of pain) (Bilateral) (L>R) 08/07/2016  . Chronic abdominal pain (Right lower quadrant) 08/07/2016  . Thoracic radiculitis (Right) (T11 dermatome) 08/07/2016  . Chronic occipital neuralgia [facility] source of pain) (Bilateral) (L>R) 08/07/2016  . Chronic neck pain 08/07/2016  . Chronic cervical radicular pain (Bilateral) (L>R) 08/07/2016  . Chronic shoulder blade pain (Bilateral) (L>R) 08/07/2016  . Chronic upper extremity pain (Bilateral) (R>L) 08/07/2016  . Chronic knee pain (Bilateral) (R>L) 08/07/2016  . Chronic ankle pain (Bilateral) 08/07/2016  . Cervical spondylosis with myelopathy and radiculopathy 08/07/2016  . Panic disorder with agoraphobia 05/29/2016  . Depression, unspecified depression type 05/29/2016  . Atypical lymphocytosis 05/01/2016  . Vitamin D insufficiency 05/01/2016  . Chronic lower extremity cramps (Bilateral) (R>L) 04/29/2016  . Obesity 04/29/2016  . GAD (generalized anxiety disorder) 04/29/2016  . Fatigue 04/29/2016  . Insomnia 07/12/2015  . Migraine without aura and with status migrainosus, not intractable 07/12/2015  . Chronic superficial gastritis 06/02/2015  .  Chronic pain of multiple joints 05/15/2015  . Bilateral leg edema 05/02/2015  . Paroxysmal supraventricular tachycardia ([place]) 04/17/2015  . Exertional shortness of breath 04/17/2015  . Bright red rectal bleeding 04/06/2015   . DDD (degenerative disc disease), lumbosacral 01/24/2014  . DDD (degenerative disc disease), lumbar 01/24/2014  . Cervico-occipital neuralgia 12/29/2013  . Fibromyalgia 12/29/2013  . Migraine headache 12/29/2013  . Menorrhagia 12/10/2012  . Depression, major, recurrent, in remission ([place]) 01/12/2009  . Chest pain 01/12/2009  . Hypertension, benign essential, goal below 140/90 06/23/2008  . History of PSVT (paroxysmal supraventricular tachycardia) 06/17/2008  . Obstructive sleep apnea, adult 06/17/2008  . GERD 06/13/2008    Social History   Tobacco Use  . Smoking status: Former Smoker    Packs/day: 4.00    Years: 3.00    Pack years: 12.00    Types: Cigarettes    Last attempt to quit: 12/10/1992    Years since quitting: 25.6  . Smokeless tobacco: Never Used  . Tobacco comment: quit 25 years ago  Substance Use Topics  . Alcohol use: No    Alcohol/week: 0.0 standard drinks     Current Outpatient Medications:  .  acetaminophen (TYLENOL) 325 MG tablet, Take 650 mg by mouth as needed. , Disp: , Rfl:  .  Calcium-Vitamin D-Vitamin K 315-176-16 MG-UNT-MCG TABS, Take 1 tablet by mouth daily. , Disp: , Rfl:  .  Cyanocobalamin (VITAMIN B-12) 500 MCG SUBL, Place 3 tablets (1,500 mcg total) under the tongue daily., Disp: 150 tablet, Rfl:  .  magnesium oxide (MAG-OX) 400 MG tablet, Take 400 mg by mouth daily., Disp: , Rfl:  .  metoprolol tartrate (LOPRESSOR) 50 MG tablet, TAKE 1 TABLET BY MOUTH TWICE A DAY, Disp: 180 tablet, Rfl: 3 .  ondansetron (ZOFRAN) 4 MG tablet, Take 1 tablet (4 mg total) every 8 (eight) hours as needed by mouth., Disp: 20 tablet, Rfl: 2 .  rosuvastatin (CRESTOR) 20 MG tablet, Take 1 tablet (20 mg total) by mouth at bedtime. For cholesterol, Disp: 90 tablet, Rfl: 0 .  baclofen (LIORESAL) 10 MG tablet, Take 1 tablet (10 mg total) 3 (three) times daily by mouth., Disp: 90 tablet, Rfl: 2 .  clonazePAM (KLONOPIN) 0.5 MG tablet, Take 1 tablet (0.5 mg total) by mouth 2  (two) times daily as needed for anxiety (only for severe panic sx). (Patient not taking: Reported on 07/30/2018), Disp: 60 tablet, Rfl: 0 .  diclofenac sodium (VOLTAREN) 1 % GEL, Apply 2 g topically 4 (four) times daily. (Patient taking differently: Apply 2 g topically as needed. ), Disp: 1 Tube, Rfl: 2 .  HYDROcodone-acetaminophen (NORCO) 7.5-325 MG tablet, Take 1 tablet by mouth every 6 (six) hours as needed for severe pain., Disp: 30 tablet, Rfl: 0 .  HYDROcodone-acetaminophen (NORCO) 7.5-325 MG tablet, Take 1 tablet by mouth every 6 (six) hours as needed for moderate pain., Disp: 30 tablet, Rfl: 0 .  omeprazole (PRILOSEC) 20 MG capsule, Take 1 capsule (20 mg total) by mouth daily. (Patient not taking: Reported on 07/30/2018), Disp: 30 capsule, Rfl: 0 .  pregabalin (LYRICA) 150 MG capsule, Take 1 capsule (150 mg total) by mouth every 8 (eight) hours., Disp: 90 capsule, Rfl: 2 .  Vitamin D, Ergocalciferol, (DRISDOL) 50000 units CAPS capsule, Take 1 capsule (50,000 Units total) by mouth every 7 (seven) days. (Patient not taking: Reported on 07/30/2018), Disp: 4 capsule, Rfl: 1  Allergies  Allergen Reactions  . Aspirin Swelling  . Cymbalta [Duloxetine Hcl] Other (See Comments)  Suicidal ideations and has homicidal thoughts per patient  . Depakote [Divalproex Sodium] Shortness Of Breath    W/ n/v  . Duloxetine Other (See Comments)    Suicidal idealation  . Haloperidol Shortness Of Breath    W/ n/v  . Iodinated Diagnostic Agents Other (See Comments)  . Iodine Shortness Of Breath  . Meperidine Nausea And Vomiting    Patient projectile vomits and usually result in ER  . Reglan [Metoclopramide] Shortness Of Breath    Can't breath, wheezes  . Tramadol Hcl Palpitations    Severely and adversely affects her SVT giving her tachycardias of 150-160 bpm.  . Trazodone Shortness Of Breath  . Compazine [Prochlorperazine] Other (See Comments)    Panic attack  . Meloxicam Other (See Comments)     mouth sores, tingling, blisters in mouth  . Penicillins Rash  . Tomato Hives    Tongue will blister  . Other   . Shellfish Allergy     Other reaction(s): Unknown  . Shellfish-Derived Products Other (See Comments)  . Bacitracin-Neomycin-Polymyxin Rash  . Cephalosporins Rash    rash  . Ibuprofen Other (See Comments) and Rash    Blisters in mouth. Blisters in mouth.  . Latex Itching  . Neosporin [Neomycin-Bacitracin Zn-Polymyx] Rash  . Nsaids Other (See Comments)    Blisters in mouth; can take 1 ibuprofen 2x a month  . Sulfa Antibiotics Rash    rash Other reaction(s): Unknown rash   . Sulfasalazine Rash  . Sulfonamide Derivatives Rash    I personally reviewed active problem list, medication list, allergies, lab results with the patient/caregiver today.  ROS  Ten systems reviewed and is negative except as mentioned in HPI.  Objective  Vitals:   07/30/18 1333  BP: 124/70  Pulse: 82  Resp: 16  Temp: 98.4 F (36.9 C)  TempSrc: Oral  SpO2: 96%  Weight: 213 lb 12.8 oz (97 kg)  Height: '5\' 3"'$  (1.6 m)   Body mass index is 37.87 kg/m.  Nursing Note and Vital Signs reviewed.  Physical Exam Constitutional: Patient appears well-developed and well-nourished. No distress.  HENT: Head: Normocephalic and atraumatic. Eyes: Conjunctivae and EOM are normal. No scleral icterus.  Pupils are equal, round, and reactive to light.  Neck: Normal range of motion. Neck supple. No JVD present. No thyromegaly present.  Cardiovascular: Normal rate, regular rhythm and normal heart sounds.  No murmur heard. No BLE edema. Pulmonary/Chest: Effort normal and breath sounds normal. No respiratory distress. Musculoskeletal: Normal range of motion, no joint effusions. No gross deformities Neurological: Pt is alert and oriented to person, place, and time. No cranial nerve deficit. Coordination, balance, strength, speech and gait are normal.  Skin: Skin is warm and dry. Malar rash is erythematous with  some raised areas - slightly asymmetric and worse on the LEFT cheek.  No other rashes of note today. Psychiatric: Patient has a normal mood and affect. behavior is normal. Judgment and thought content normal.  No results found for this or any previous visit (from the past 72 hour(s)).  Assessment & Plan  1. Malar rash - C-reactive protein - Sed Rate (ESR) - ANA - CBC w/Diff/Platelet - Ambulatory referral to Chronic Care Management Services  2. Fibromyalgia - Ambulatory referral to Chronic Care Management Services  3. Other fatigue - Ambulatory referral to Chronic Care Management Services  -Red flags and when to present for emergency care or RTC including fever >101.33F, chest pain, shortness of breath, new/worsening/un-resolving symptoms, reviewed with patient at time of  visit. Follow up and care instructions discussed and provided in AVS.

## 2018-07-31 ENCOUNTER — Ambulatory Visit: Payer: Self-pay | Admitting: *Deleted

## 2018-07-31 NOTE — Patient Instructions (Signed)
Goals    . "I need help finding insurance options" (pt-stated)     Clinical Goal(s): Over the next 7 days, patient will verbalize understanding of options for insurance for self and family members  Interventions: Collaboration with [facility] re: options for insurance coverage and procedure for application       . "I need to get help finding food for my family" (pt-stated)       Clinical Goal(s): Over the next 7 days, patient will verbalize understanding of food resources in her geographic area  Interventions: Information as outlined below provided to patient by phone    [employer], [place]  [place], [phone], 0.9 mi from [facility], can  come four times per year, bring your photo ID and SS cards for  other residents of household.    [address], [place], [place], [phone], 1.7 mi from [facility], can come once every sixty days, bring referral from GUM, a  church or DSS, bring photo ID and SS card    [employer] [facility], [address], [place], [phone], 7.7 mi from [facility], can come once every thirty days with a referral from [place],  [employer], [place], etc. - each referral good for six  visits, bring photo [place], [address], [place], [phone], 4.2 mi from [facility],  can come once every 6 months, open to [facility] residents,  bring photo ID and copy of a current utility bill in your name, please  call first to verify that food is available    [employer], [address] [address],  [phone], 3.2 mi from [facility], can come once  every 30 days, maximum 6 times per year, bring your photo ID  and SS numbers for other residents of household         The patient verbalized understanding of instructions provided today and declined a print copy of patient  instruction materials.   CCM (Chronic Care Management) Team   Trish Fountain RN, BSN Nurse Care Coordinator  [phone]  Ruben Reason PharmD  Clinical Pharmacist  [phone]

## 2018-07-31 NOTE — Chronic Care Management (AMB) (Signed)
  Care Management   Follow Up Note   07/31/2018 Name: Dana Bradley MRN: [id number] DOB: Dec 15, 1970  Referred by: Dana Courser, MD Reason for referral : Care Coordination (Food Resources/Health Plan Info)  Subjective: "I appreciate any help I can get"  Assessment: [name] is a 47 year old female primary care patient of Dr. Enid Bradley. Dana Bradley was referred by Dana Ensign FNP today to care management for assistance with health plan resources and to address food resource needs.   Goals Addressed    . "I need help finding insurance options" (pt-stated)       Clinical Goal(s): Over the next 7 days, patient will verbalize understanding of options for insurance for self and family members  Interventions: Collaboration with [facility] re: options for insurance coverage and procedure for application     . "I need to get help finding food for my family" (pt-stated)         Clinical Goal(s): Over the next 7 days, patient will verbalize understanding of food resources in her geographic area  Interventions: Information as outlined below provided to patient by phone    [employer], [place]  [place], [phone], 0.9 mi from [facility], can  come four times per year, bring your photo ID and SS cards for  other residents of household.    [address] [place], [place], [phone], 1.7 mi from [facility], can come once every sixty days, bring referral from GUM, a  church or DSS, bring photo ID and SS card    [employer] [facility], [address], [place], [phone], 7.7 mi from [facility], can come once every thirty days with a referral from [place],  [employer], [place], etc. - each referral good for six  visits, bring photo [place], [address], [place], [phone], 4.2 mi from [facility],  can come once  every 6 months, open to [facility] residents,  bring photo ID and copy of a current utility bill in your name, please  call first to verify that food is available    [employer], [address] [address],  [phone], 3.2 mi from [facility], can come once  every 30 days, maximum 6 times per year, bring your photo ID  and SS numbers for other residents of household      Plan: The case management team is available to help Dana Bradley as needed for future care management needs.   [facility] / [place] Management  [phone]

## 2018-08-01 LAB — CBC WITH DIFFERENTIAL/PLATELET
BASOS PCT: 0.8 %
Basophils Absolute: 66 cells/uL (ref 0–200)
Eosinophils Absolute: 100 cells/uL (ref 15–500)
Eosinophils Relative: 1.2 %
HEMATOCRIT: 40.1 % (ref 35.0–45.0)
HEMOGLOBIN: 13.7 g/dL (ref 11.7–15.5)
LYMPHS ABS: 1785 {cells}/uL (ref 850–3900)
MCH: 31.9 pg (ref 27.0–33.0)
MCHC: 34.2 g/dL (ref 32.0–36.0)
MCV: 93.3 fL (ref 80.0–100.0)
MPV: 10.2 fL (ref 7.5–12.5)
Monocytes Relative: 7.5 %
NEUTROS ABS: 5727 {cells}/uL (ref 1500–7800)
Neutrophils Relative %: 69 %
Platelets: 243 10*3/uL (ref 140–400)
RBC: 4.3 10*6/uL (ref 3.80–5.10)
RDW: 12.1 % (ref 11.0–15.0)
Total Lymphocyte: 21.5 %
WBC: 8.3 10*3/uL (ref 3.8–10.8)
WBCMIX: 623 {cells}/uL (ref 200–950)

## 2018-08-01 LAB — SEDIMENTATION RATE: SED RATE: 17 mm/h (ref 0–20)

## 2018-08-01 LAB — ANA: Anti Nuclear Antibody(ANA): NEGATIVE

## 2018-08-01 LAB — C-REACTIVE PROTEIN: CRP: 6 mg/L (ref <8.0)

## 2018-08-07 ENCOUNTER — Encounter: Payer: Self-pay | Admitting: Family Medicine

## 2018-08-18 ENCOUNTER — Other Ambulatory Visit: Payer: Self-pay | Admitting: Family Medicine

## 2018-08-18 DIAGNOSIS — I10 Essential (primary) hypertension: Secondary | ICD-10-CM

## 2018-08-21 ENCOUNTER — Other Ambulatory Visit: Payer: Self-pay | Admitting: Family Medicine

## 2018-08-21 ENCOUNTER — Telehealth: Payer: Self-pay | Admitting: Psychiatry

## 2018-08-21 DIAGNOSIS — K219 Gastro-esophageal reflux disease without esophagitis: Secondary | ICD-10-CM

## 2018-08-21 MED ORDER — OXCARBAZEPINE 150 MG PO TABS: 150.0000 mg | ORAL_TABLET | Freq: Two times a day (BID) | ORAL | 2 refills | Status: DC

## 2018-08-21 MED ORDER — DESVENLAFAXINE SUCCINATE ER 25 MG PO TB24: 25.0000 mg | ORAL_TABLET | Freq: Every day | ORAL | 2 refills | Status: DC

## 2018-08-21 NOTE — Telephone Encounter (Signed)
Copied from [place]. Topic: Quick Communication - Rx Refill/Question >> Aug 21, 2018 11:29 AM Leward Quan A wrote: Medication: rosuvastatin (CRESTOR) 20 MG tablet   (request change for Atorvastatin)  omeprazole (PRILOSEC) 20 MG capsule  (request change for DEXILANT (dexlansoprazole))  Has the patient contacted their pharmacy? Yes.   (Agent: If no, request that the patient contact the pharmacy for the refill.) (Agent: If yes, when and what did the pharmacy advise?)  Preferred Pharmacy (with phone number or street name): [facility] [phone]  Agent: Please be advised that RX refills may take up to 3 business days. We ask that you follow-up with your pharmacy.

## 2018-08-21 NOTE — Telephone Encounter (Signed)
Routing back to [facility] 

## 2018-08-21 NOTE — Telephone Encounter (Signed)
Spoke to patient regarding changing effexor to pristiq since medication assistance program recommends that. Will also restart Trileptal. Will fax the scripts to [facility]. Will give it to [facility] .

## 2018-08-25 MED ORDER — ATORVASTATIN CALCIUM 40 MG PO TABS: 40.0000 mg | ORAL_TABLET | Freq: Every day | ORAL | 2 refills | Status: DC

## 2018-08-25 MED ORDER — DEXLANSOPRAZOLE 30 MG PO CPDR: 30.0000 mg | DELAYED_RELEASE_CAPSULE | Freq: Every day | ORAL | 2 refills | Status: DC

## 2018-08-25 NOTE — Telephone Encounter (Signed)
Crystal is calling back in to follow up on previous message sent.   Please advise.    CB: [phone]

## 2018-08-25 NOTE — Telephone Encounter (Signed)
Per request, will switch PPI Will switch statin We will not touch the lyrica or effexor because I do not prescribe those Also, she is on pristiq now instead of effexor anyway We do not prescribe her hydrocodone or baclofen so those will have to managed by her pain doctor ----------------------------------------------- Roselyn Reef, see note from Arlys John, PharmD

## 2018-08-26 ENCOUNTER — Ambulatory Visit: Payer: Self-pay | Admitting: Pharmacist

## 2018-08-26 DIAGNOSIS — Z599 Problem related to housing and economic circumstances, unspecified: Secondary | ICD-10-CM

## 2018-08-26 DIAGNOSIS — Z598 Other problems related to housing and economic circumstances: Secondary | ICD-10-CM

## 2018-08-26 NOTE — Chronic Care Management (AMB) (Signed)
  Care Management   Note  08/26/2018 Name: Dana Bradley MRN: [id number] DOB: Mar 09, 1971    Received incoming call from  [name]. Navaya had previously been referred to the Care Management team. Lyndi identified HIPAA x 2.  Avarey states that both she and her husband have lost their Medicaid and have appointments scheduled at the free clinic in [place] (they live in [place]). However, both Mr and Mrs Cala are out of medication and the scheduled appointment with the free clinic is not until January 2020. She asks if I could speak to Dr. Sanda Klein and figure out a plan for  medications.   I collaborated with Dr. Sanda Klein. Dr. Sanda Klein received a fax from the [facility] detailing the ways their pharmacist, Arlys John, was assisting Mr. And Mrs Pilger. Together, Dr. Sanda Klein and I reviewed the recommendations of Dr. Kennon Rounds.   I called Dr. Kennon Rounds to update her on the medication list that I reviewed with Dr. Sanda Klein.   Faxes received from Dr. Kennon Rounds and the [facility] Medication Assistance Program will be uploaded into the media tab.   I will not add myself to the care team at this time as Mrs. Junio is expected to be under the care of Dr. Amil Amen.   Ruben Reason, PharmD Clinical Pharmacist [facility] [phone]

## 2018-08-26 NOTE — Patient Outreach (Signed)
[facility]) Care Management  08/26/2018  Dana Bradley Mar 17, 1971 [id number]  BSW mailed patient community resources as requested by [place].  Daneen Schick, BSW, CDP [facility] [phone]

## 2018-09-03 ENCOUNTER — Encounter: Payer: Self-pay | Admitting: Family Medicine

## 2018-09-04 ENCOUNTER — Other Ambulatory Visit: Payer: Self-pay

## 2018-09-04 ENCOUNTER — Ambulatory Visit: Payer: Self-pay | Admitting: Internal Medicine

## 2018-09-04 DIAGNOSIS — R21 Rash and other nonspecific skin eruption: Secondary | ICD-10-CM

## 2018-09-04 DIAGNOSIS — Z87891 Personal history of nicotine dependence: Secondary | ICD-10-CM

## 2018-09-04 NOTE — Progress Notes (Signed)
CC: Establish care, Malar rash  HPI:  [name] is a 47 y.o. F with PMHx listed below presenting to establish care. Please see the A&P for the status of the patient's chronic medical problems.   Past Medical History:  Diagnosis Date  . Acute postoperative pain 04/07/2017  . Anxiety   . Bursitis   . Chronic fatigue 12/12/2017  . Edema leg 05/02/2015  . Fibromyalgia   . GERD (gastroesophageal reflux disease)   . IBS (irritable bowel syndrome)   . Knee pain, bilateral 12/21/2008   Qualifier: Diagnosis of  By: Hassell Done FNP, Tori Milks    . Lumbar discitis   . Osteoarthritis   . Right hand pain 04/10/2015   [facility] has done nerve conduction studies and ruled out carpal tunnel.   . Sleep apnea   . SVT (supraventricular tachycardia) ([place])   . Vertigo   . Vitamin D deficiency 05/01/2016   Past Surgical History:  Procedure Laterality Date  . ABLATION     Uterine  . CARDIAC CATHETERIZATION     with ablation  . COLONOSCOPY WITH PROPOFOL N/A 05/17/2015   Procedure: COLONOSCOPY WITH PROPOFOL;  Surgeon: Manya Silvas, MD;  Location: [facility] ENDOSCOPY;  Service: Endoscopy;  Laterality: N/A;  . ESOPHAGOGASTRODUODENOSCOPY N/A 05/17/2015   Procedure: ESOPHAGOGASTRODUODENOSCOPY (EGD);  Surgeon: Manya Silvas, MD;  Location: [facility] ENDOSCOPY;  Service: Endoscopy;  Laterality: N/A;  . KNEE ARTHROSCOPY    . spg     6/18  . TUBAL LIGATION  10/01/99   Family History  Problem Relation Age of Onset  . Depression Mother   . Hypertension Mother   . Cancer Mother        Skin  . Hyperlipidemia Mother   . Anxiety disorder Mother   . Alcohol abuse Father   . Depression Father   . Stroke Father   . Heart disease Father   . Hypertension Father   . Anxiety disorder Father   . Depression Sister   . Hyperlipidemia Sister   . Diabetes Sister   . Hypertension Sister   . Polycystic ovary syndrome Sister   . Bipolar disorder Sister   . Anxiety disorder Sister   . Cancer Maternal  Grandmother 63       Breast  . Thyroid disease Maternal Grandmother   . Arthritis Maternal Grandmother   . Hyperlipidemia Maternal Grandmother   . Depression Sister   . Hypertension Sister   . Anxiety disorder Sister   . Alzheimer's disease Unknown   . Aneurysm Maternal Grandfather   . Hypertension Maternal Grandfather   . Heart disease Maternal Grandfather   . Alzheimer's disease Paternal Grandmother   . Heart attack Paternal Grandfather   . Hypertension Paternal Grandfather   . COPD Paternal Grandfather   . Heart disease Paternal Grandfather   . Bladder Cancer Neg Hx   . Kidney cancer Neg Hx    Social History   Socioeconomic History  . Marital status: Married    Spouse name: brian  . Number of children: 3  . Years of education: Not on file  . Highest education level: Associate degree: academic program  Occupational History    Comment: not able  Social Needs  . Financial resource strain: Very hard  . Food insecurity:    Worry: Often true    Inability: Often true  . Transportation needs:    Medical: Yes    Non-medical: Yes  Tobacco Use  . Smoking status: Former Smoker    Packs/day: 4.00  Years: 3.00    Pack years: 12.00    Types: Cigarettes    Last attempt to quit: 12/10/1992    Years since quitting: 25.7  . Smokeless tobacco: Never Used  . Tobacco comment: quit 25 years ago  Substance and Sexual Activity  . Alcohol use: No    Alcohol/week: 0.0 standard drinks  . Drug use: No  . Sexual activity: Not Currently    Partners: Male  Lifestyle  . Physical activity:    Days per week: 0 days    Minutes per session: 0 min  . Stress: Rather much  Relationships  . Social connections:    Talks on phone: Never    Gets together: Never    Attends religious service: More than 4 times per year    Active member of club or organization: No    Attends meetings of clubs or organizations: Never    Relationship status: Married  Other Topics Concern  . Not on file   Social History Narrative  . Not on file    Review of Systems:  Performed and all others negative.   Physical Exam:  Vitals:   09/04/18 1404  BP: 124/70  Pulse: 85  Temp: 98.4 F (36.9 C)  TempSrc: Oral  SpO2: 98%  Weight: 220 lb (99.8 kg)  Height: 5\' 3"  (1.6 m)   Physical Exam  Constitutional: She appears well-developed and well-nourished. No distress.  Cardiovascular: Normal rate, regular rhythm, normal heart sounds and intact distal pulses.  Pulmonary/Chest: Effort normal and breath sounds normal. No respiratory distress.  Abdominal: Soft. Bowel sounds are normal. She exhibits no distension. There is no tenderness.  Musculoskeletal: She exhibits no edema or deformity.  Skin: Skin is warm and dry.     Assessment & Plan:   See Encounters Tab for problem based charting.  Patient discussed with Dr. Angelia Mould

## 2018-09-04 NOTE — Patient Instructions (Addendum)
Thank you for allowing [place] to care for you  Welcome to the [facility] Center!  Have a happy Holidays!

## 2018-09-04 NOTE — Assessment & Plan Note (Signed)
Patient has had recent workup for intermitted Malar rash. She has had negative auto-immune work-up with normal ANA, ESR, CRP, and CBC. She denies and new exposures or topical products. Dermatology evaluation and potential biopsy would be a reasonable next step, though patient current is uninsured and is working to obtain assistance through [employer] program. - Continue to monitor

## 2018-09-14 NOTE — Progress Notes (Signed)
Internal Medicine Clinic Attending  Case discussed with Dr. Melvin  at the time of the visit.  We reviewed the resident's history and exam and pertinent patient test results.  I agree with the assessment, diagnosis, and plan of care documented in the resident's note.  

## 2018-09-17 ENCOUNTER — Other Ambulatory Visit: Payer: Self-pay | Admitting: Internal Medicine

## 2018-09-17 DIAGNOSIS — M797 Fibromyalgia: Secondary | ICD-10-CM

## 2018-09-17 MED ORDER — PREGABALIN 150 MG PO CAPS: 150.0000 mg | ORAL_CAPSULE | Freq: Three times a day (TID) | ORAL | 2 refills | Status: DC

## 2018-09-17 NOTE — Progress Notes (Signed)
Printed refill prescription for Lyrica per requirement for manufacturer assistance program.

## 2018-10-06 ENCOUNTER — Ambulatory Visit: Payer: Self-pay | Admitting: Internal Medicine

## 2018-10-09 ENCOUNTER — Ambulatory Visit: Payer: Self-pay

## 2018-11-10 ENCOUNTER — Ambulatory Visit: Payer: Self-pay | Admitting: Family Medicine

## 2018-11-18 ENCOUNTER — Ambulatory Visit: Payer: Self-pay | Attending: Nurse Practitioner | Admitting: Nurse Practitioner

## 2018-11-18 ENCOUNTER — Encounter: Payer: Self-pay | Admitting: Nurse Practitioner

## 2018-11-18 VITALS — BP 114/65 | HR 76 | Temp 98.0°F | Resp 16 | Ht 63.0 in | Wt 215.0 lb

## 2018-11-18 DIAGNOSIS — M62838 Other muscle spasm: Secondary | ICD-10-CM

## 2018-11-18 DIAGNOSIS — R51 Headache: Secondary | ICD-10-CM

## 2018-11-18 DIAGNOSIS — M19011 Primary osteoarthritis, right shoulder: Secondary | ICD-10-CM

## 2018-11-18 DIAGNOSIS — M4712 Other spondylosis with myelopathy, cervical region: Secondary | ICD-10-CM

## 2018-11-18 DIAGNOSIS — M797 Fibromyalgia: Secondary | ICD-10-CM

## 2018-11-18 DIAGNOSIS — M47816 Spondylosis without myelopathy or radiculopathy, lumbar region: Secondary | ICD-10-CM

## 2018-11-18 DIAGNOSIS — G4486 Cervicogenic headache: Secondary | ICD-10-CM

## 2018-11-18 DIAGNOSIS — G894 Chronic pain syndrome: Secondary | ICD-10-CM

## 2018-11-18 DIAGNOSIS — M792 Neuralgia and neuritis, unspecified: Secondary | ICD-10-CM

## 2018-11-18 DIAGNOSIS — M4722 Other spondylosis with radiculopathy, cervical region: Secondary | ICD-10-CM

## 2018-11-18 DIAGNOSIS — M5481 Occipital neuralgia: Secondary | ICD-10-CM

## 2018-11-18 DIAGNOSIS — M19012 Primary osteoarthritis, left shoulder: Secondary | ICD-10-CM

## 2018-11-18 MED ORDER — DICLOFENAC SODIUM 1 % TD GEL: 2.0000 g | Freq: Four times a day (QID) | TRANSDERMAL | 2 refills | Status: DC

## 2018-11-18 MED ORDER — PREGABALIN 150 MG PO CAPS: 150.0000 mg | ORAL_CAPSULE | Freq: Three times a day (TID) | ORAL | 2 refills | Status: DC

## 2018-11-18 MED ORDER — BACLOFEN 10 MG PO TABS: 10.0000 mg | ORAL_TABLET | Freq: Three times a day (TID) | ORAL | 2 refills | Status: DC

## 2018-11-18 NOTE — Progress Notes (Signed)
Patient's Name: Dana Bradley  MRN: [id number]  Referring Provider: Neva Seat, MD  DOB: 01-10-1971  PCP: Neva Seat, MD  DOS: 11/18/2018  Note by: Dionisio David, NP  Service setting: Ambulatory outpatient  Specialty: Interventional Pain Management  Location: [facility] (AMB) Pain Management Facility    Patient type: Established   HPI  Reason for Visit: [name] is a 48 y.o. year old, female patient, who comes today with a chief complaint of Headache (miagraine left occiput ) and Back Pain (lumbar bilateral )   Pain Assessment: Today, [name] describes the severity of the Chronic pain as a 5 /10. She indicates the location/referral of the pain to be Head(back pain ) Posterior, Left(bilateral lumbar)/headache radiates into face and into shoulder and down left arm.  back pain down both legs into feet . Onset was: More than a month ago. The quality of pain is described as Discomfort, Stabbing, Burning, Constant, Numbness(back, pressure, stabbing, sharp, leg pain is burning or electricity or shocks). Temporal description, or timing of pain is: Constant. Possible modifying factors: dark glasses keeps the headache from escalating, quiet room. medications keep it from getting worse . [name]'s  height is '5\' 3"'$  (1.6 m) and weight is 215 lb (97.5 kg). Her oral temperature is 98 F (36.7 C). Her blood pressure is 114/65 and her pulse is 76. Her respiration is 16 and oxygen saturation is 99%.  She is in today with her husband. She admits that non of the interventional therapy has been effective for her pain management. She does not use the Norco unless her pain is severe because she does not want to come dependant on them. Her husband feels like this is best secondary to their financial situation.  However she feels like she is limited in her ADLs and her family has to care for her sometimes completely.   Controlled Substance Pharmacotherapy Assessment REMS (Risk Evaluation and Mitigation Strategy)   Analgesic: Hydrocodone 7.'5mg'$  /day  MME/day: 7.5 mg/day.  Janett Billow, RN  11/18/2018  2:29 PM  Sign when Signing Visit Safety precautions to be maintained throughout the outpatient stay will include: orient to surroundings, keep bed in low position, maintain call bell within reach at all times, provide assistance with transfer out of bed and ambulation.    Pharmacokinetics: Liberation and absorption (onset of action): WNL Distribution (time to peak effect): WNL Metabolism and excretion (duration of action): WNL         Pharmacodynamics: Desired effects: Analgesia: [name] reports >50% benefit. Functional ability: Patient reports that medication allows her to accomplish basic ADLs Clinically meaningful improvement in function (CMIF): Sustained CMIF goals met Perceived effectiveness: Described as relatively effective, allowing for increase in activities of daily living (ADL) Undesirable effects: Side-effects or Adverse reactions: None reported Monitoring: [place] PMP: Online review of the past 39-monthperiod conducted. Compliant with practice rules and regulations Last UDS on record: Summary  Date Value Ref Range Status  03/05/2018 FINAL  Final    Comment:    ==================================================================== TOXASSURE SELECT 13 (MW) ==================================================================== Test                             Result       Flag       Units Drug Present   Hydrocodone                    422  ng/mg creat   Hydromorphone                  105                     ng/mg creat   Norhydrocodone                 312                     ng/mg creat    Sources of hydrocodone include scheduled prescription    medications. Hydromorphone and norhydrocodone are expected    metabolites of hydrocodone. Hydromorphone is also available as a    scheduled prescription  medication. ==================================================================== Test                      Result    Flag   Units      Ref Range   Creatinine              148              mg/dL      >=20 ==================================================================== Declared Medications:  Medication list was not provided. ==================================================================== For clinical consultation, please call [phone]. ====================================================================    UDS interpretation: Compliant          Medication Assessment Form: Reviewed. Patient indicates being compliant with therapy Treatment compliance: Compliant Risk Assessment Profile: Aberrant behavior: See initial evaluations. None observed or detected today Comorbid factors increasing risk of overdose: See initial evaluation. No additional risks detected today Opioid risk tool (ORT):  Opioid Risk  04/07/2018  Alcohol 3  Illegal Drugs 0  Rx Drugs 0  Alcohol 0  Illegal Drugs 0  Rx Drugs 0  Age between 16-45 years  0  History of Preadolescent Sexual Abuse 0  Psychological Disease 0  ADD -  OCD -  Bipolar -  Depression 0  Opioid Risk Tool Scoring 3  Opioid Risk Interpretation Low Risk    ORT Scoring interpretation table:  Score <3 = Low Risk for SUD  Score between 4-7 = Moderate Risk for SUD  Score >8 = High Risk for Opioid Abuse   Risk of substance use disorder (SUD): Low  Risk Mitigation Strategies:  Patient Counseling: Covered Patient-Prescriber Agreement (PPA): Present and active  Notification to other healthcare providers: Done  Pharmacologic Plan: No change in therapy, at this time.            ROS  Constitutional: Denies any fever or chills Gastrointestinal: No reported hemesis, hematochezia, vomiting, or acute GI distress Musculoskeletal: Denies any acute onset joint swelling, redness, loss of ROM, or weakness Neurological: No reported episodes of  acute onset apraxia, aphasia, dysarthria, agnosia, amnesia, paralysis, loss of coordination, or loss of consciousness  Medication Review  Desvenlafaxine Succinate ER, Dexlansoprazole, HYDROcodone-acetaminophen, OXcarbazepine, Vitamin B-12, acetaminophen, atorvastatin, baclofen, diclofenac sodium, magnesium oxide, metoprolol tartrate, ondansetron, oxyCODONE, and pregabalin  History Review  Allergy: Ms. Giarratano is allergic to aspirin; cymbalta [duloxetine hcl]; depakote [divalproex sodium]; duloxetine; haloperidol; iodinated diagnostic agents; iodine; meperidine; reglan [metoclopramide]; tramadol hcl; trazodone; compazine [prochlorperazine]; meloxicam; penicillins; tomato; other; shellfish allergy; shellfish-derived products; bacitracin-neomycin-polymyxin; cephalosporins; ibuprofen; latex; neosporin [neomycin-bacitracin zn-polymyx]; nsaids; sulfa antibiotics; sulfasalazine; and sulfonamide derivatives. Drug: Ms. Boulos  reports no history of drug use. Alcohol:  reports no history of alcohol use. Tobacco:  reports that she quit smoking about 25 years ago. Her smoking use included cigarettes. She has a 12.00 pack-year smoking history.  She has never used smokeless tobacco. Social: Ms. Mallet  reports that she quit smoking about 25 years ago. Her smoking use included cigarettes. She has a 12.00 pack-year smoking history. She has never used smokeless tobacco. She reports that she does not drink alcohol or use drugs. Medical:  has a past medical history of Acute postoperative pain (04/07/2017), Anxiety, Bursitis, Chronic fatigue (12/12/2017), Edema leg (05/02/2015), Fibromyalgia, GERD (gastroesophageal reflux disease), IBS (irritable bowel syndrome), Knee pain, bilateral (12/21/2008), Lumbar discitis, Osteoarthritis, Right hand pain (04/10/2015), Sleep apnea, SVT (supraventricular tachycardia) ([place]), Vertigo, and Vitamin D deficiency (05/01/2016). Surgical: Ms. Hodzic  has a past surgical history that includes Ablation;  Tubal ligation (10/01/99); Cardiac catheterization; Knee arthroscopy; Colonoscopy with propofol (N/A, 05/17/2015); Esophagogastroduodenoscopy (N/A, 05/17/2015); spg; and Tibial Tubercle Bypass (Right, 1998). Family: family history includes Alcohol abuse in her father; Alzheimer's disease in her paternal grandmother and another family member; Aneurysm in her maternal grandfather; Anxiety disorder in her father, mother, sister, and sister; Arthritis in her maternal grandmother; Bipolar disorder in her sister; COPD in her paternal grandfather; Cancer in her mother; Cancer (age of onset: 13) in her maternal grandmother; Depression in her father, mother, sister, and sister; Diabetes in her sister; Heart attack in her paternal grandfather; Heart disease in her father, maternal grandfather, and paternal grandfather; Hyperlipidemia in her maternal grandmother, mother, and sister; Hypertension in her father, maternal grandfather, mother, paternal grandfather, sister, and sister; Polycystic ovary syndrome in her sister; Stroke in her father; Thyroid disease in her maternal grandmother. Problem List: Ms. Stengel does not have any pertinent problems on file.  Lab Review  Kidney Function Lab Results  Component Value Date   BUN 13 11/24/2017   CREATININE 0.80 78/29/5621   BCR NOT APPLICABLE 30/86/5784   GFRAA 102 11/24/2017   GFRNONAA 88 11/24/2017  Liver Function Lab Results  Component Value Date   AST 14 11/24/2017   ALT 21 11/24/2017   ALBUMIN 4.1 04/01/2017  Note: Above Lab results reviewed.  Imaging Review  NM Myocar Multi W/Spect W/Wall Motion / EF  Blood pressure demonstrated a normal response to exercise.  There was no ST segment deviation noted during stress.  No T wave inversion was noted during stress.  The study is normal.  This is a low risk study.  The left ventricular ejection fraction is normal (55-65%).   Note: Above imaging results reviewed.        Physical Exam  General  appearance: Well nourished, well developed, and well hydrated. In no apparent acute distress Mental status: Alert, oriented x 3 (person, place, & time)       Respiratory: No evidence of acute respiratory distress Eyes: PERLA Vitals: BP 114/65 (BP Location: Right Arm, Patient Position: Sitting, Cuff Size: Large)   Pulse 76   Temp 98 F (36.7 C) (Oral)   Resp 16   Ht '5\' 3"'$  (1.6 m)   Wt 215 lb (97.5 kg)   SpO2 99%   BMI 38.09 kg/m  BMI: Estimated body mass index is 38.09 kg/m as calculated from the following:   Height as of this encounter: '5\' 3"'$  (1.6 m).   Weight as of this encounter: 215 lb (97.5 kg). Ideal: Ideal body weight: 52.4 kg (115 lb 8.3 oz) Adjusted ideal body weight: 70.4 kg (155 lb 5 oz)  Cervical Spine Area Exam  Skin & Axial Inspection: No masses, redness, edema, swelling, or associated skin lesions Alignment: Symmetrical Functional ROM: Unrestricted ROM      Stability: No instability detected  Muscle Tone/Strength: Functionally intact. No obvious neuro-muscular anomalies detected. Sensory (Neurological): Unimpaired Palpation: No palpable anomalies              Gait & Posture Assessment  Ambulation: Patient ambulates using a cane Gait: Relatively normal for age and body habitus Posture: WNL   Assessment   Status Diagnosis  Controlled Controlled Controlled 1. Cervicogenic headache   2. Cervico-occipital neuralgia   3. Neurogenic pain   4. Cervical spondylosis with myelopathy and radiculopathy   5. Lumbar spondylosis   6. Fibromyalgia   7. Muscle spasticity   8. Osteoarthritis of shoulder (B)   9. Chronic pain syndrome      Updated Problems: No problems updated.  Plan of Care  Pharmacotherapy (Medications Ordered): Meds ordered this encounter  Medications  . pregabalin (LYRICA) 150 MG capsule    Sig: Take 1 capsule (150 mg total) by mouth every 8 (eight) hours.    Dispense:  90 capsule    Refill:  2    Do not add this medication to the electronic  "Automatic Refill" notification system. Patient may have prescription filled one day early if pharmacy is closed on scheduled refill date.    Order Specific Question:   Supervising Provider    Answer:   Milinda Pointer [phone]  . baclofen (LIORESAL) 10 MG tablet    Sig: Take 1 tablet (10 mg total) by mouth 3 (three) times daily.    Dispense:  90 tablet    Refill:  2    Do not place this medication, or any other prescription from our practice, on "Automatic Refill". Patient may have prescription filled one day early if pharmacy is closed on scheduled refill date.    Order Specific Question:   Supervising Provider    Answer:   Milinda Pointer [phone]  . diclofenac sodium (VOLTAREN) 1 % GEL    Sig: Apply 2 g topically 4 (four) times daily.    Dispense:  1 Tube    Refill:  2    Do not place medication on "Automatic Refill". Fill one day early if pharmacy is closed on scheduled refill date.    Order Specific Question:   Supervising Provider    Answer:   Milinda Pointer [[id number]]   Administered today: [name] had no medications administered during this visit.  Orders:  Orders Placed This Encounter  Procedures  . Ambulatory referral to Physical Therapy    Referral Priority:   Routine    Referral Type:   Physical Medicine    Referral Reason:   Specialty Services Required    Requested Specialty:   Physical Therapy    Number of Visits Requested:   1   Interventional options: Planned follow-up:   Not at this time. Plan: No follow-ups on file.   Considering: Therapeutic left cervical epidural steroid injection Palliative left L4-5 lumbar epidural steroid injections Diagnostic bilateral lumbar facet block Possible bilateral lumbar facet radiofrequency ablation. Diagnostic thoracic facet block Possible bilateral thoracic facet radiofrequency ablation. Diagnostic bilateral cervical facet blocks Possible bilateral cervical facet radiofrequency ablation. Diagnostic  bilateral lesser occipital nerve blocks Diagnostic bilateral C2 and TON nerve block Possible bilateral occipital nerve radiofrequency ablation. Diagnostic right T10-11 thoracic epidural steroid injection Diagnostic bilateral suprascapular nerve block Diagnostic bilateral intra-articular knee joint injection Possible bilateral intra-articular Hyalgan knee injections.  Diagnostic bilateral Genicular nerve block Possible bilateral Genicular nerve radiofrequency ablation.   Palliative PRN treatment(s): Palliative left trapezius muscle trigger point injection Palliative left L4-5 lumbar epidural steroid injections Diagnostic  bilateral lumbar facet block Diagnostic thoracic facet block PalliativeLeft cervical epidural steroid injections Diagnostic bilateral cervical facet blocks Diagnostic bilateral lesser occipital nerve blocks Diagnostic bilateral C2 and TON nerve block Diagnostic right T10-11 thoracic epidural steroid injection Diagnostic bilateral suprascapular nerve block Diagnostic bilateral intra-articular knee joint injection Diagnostic bilateral Genicular nerve block   Note by: Dionisio David, NP Date: 11/18/2018; Time: 11:06 PM

## 2018-11-18 NOTE — Progress Notes (Signed)
Safety precautions to be maintained throughout the outpatient stay will include: orient to surroundings, keep bed in low position, maintain call bell within reach at all times, provide assistance with transfer out of bed and ambulation.  

## 2018-11-19 ENCOUNTER — Ambulatory Visit (INDEPENDENT_AMBULATORY_CARE_PROVIDER_SITE_OTHER): Payer: Self-pay | Admitting: Licensed Clinical Social Worker

## 2018-11-19 DIAGNOSIS — F331 Major depressive disorder, recurrent, moderate: Secondary | ICD-10-CM

## 2018-11-19 DIAGNOSIS — F411 Generalized anxiety disorder: Secondary | ICD-10-CM

## 2018-11-19 DIAGNOSIS — F5105 Insomnia due to other mental disorder: Secondary | ICD-10-CM

## 2018-11-19 DIAGNOSIS — F4000 Agoraphobia, unspecified: Secondary | ICD-10-CM
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
## 2018-11-24 ENCOUNTER — Telehealth: Payer: Self-pay

## 2018-11-24 ENCOUNTER — Ambulatory Visit (INDEPENDENT_AMBULATORY_CARE_PROVIDER_SITE_OTHER): Payer: Self-pay | Admitting: Psychiatry

## 2018-11-24 ENCOUNTER — Other Ambulatory Visit: Payer: Self-pay

## 2018-11-24 ENCOUNTER — Encounter: Payer: Self-pay | Admitting: Psychiatry

## 2018-11-24 VITALS — BP 105/71 | HR 86 | Temp 99.0°F | Wt 217.8 lb

## 2018-11-24 DIAGNOSIS — F331 Major depressive disorder, recurrent, moderate: Secondary | ICD-10-CM

## 2018-11-24 DIAGNOSIS — F410 Panic disorder [episodic paroxysmal anxiety] without agoraphobia: Secondary | ICD-10-CM

## 2018-11-24 DIAGNOSIS — F411 Generalized anxiety disorder: Secondary | ICD-10-CM

## 2018-11-24 DIAGNOSIS — F4000 Agoraphobia, unspecified: Secondary | ICD-10-CM

## 2018-11-24 MED ORDER — OXCARBAZEPINE 150 MG PO TABS: 150.0000 mg | ORAL_TABLET | Freq: Two times a day (BID) | ORAL | 2 refills | Status: DC

## 2018-11-24 MED ORDER — DESVENLAFAXINE SUCCINATE ER 50 MG PO TB24: 50.0000 mg | ORAL_TABLET | Freq: Every day | ORAL | 1 refills | Status: DC

## 2018-11-24 NOTE — Telephone Encounter (Signed)
faxed and confirmed rx for pristiq 50mg  id #[id number] order # [id number] #30 with 1 refill and trileptal 150mg  id # [id number] order # [id number] # 60 refill 2

## 2018-11-24 NOTE — Progress Notes (Signed)
[place] MD OP Progress Note  11/24/2018 4:44 PM Dana Bradley  MRN:  [id number]  Chief Complaint: ' I am here for follow up." Chief Complaint    Follow-up     HPI: Dana Bradley is a 48 yr old who is unemployed, married, lives in [place] ,has a history of depression, mood lability, panic attacks, agoraphobia, migraine headaches, IBS, presented to clinic today for a follow-up visit.  Patient was last seen on 01/27/2018.  Patient had financial problems, health insurance issues and hence could not make sooner appointment.  Patient however was receiving medications from medication assistance program.  Patient reports she is currently on Pristiq which was prescribed by Probation officer in November.  Patient reports she continues to struggle with some depressive symptoms like fatigue, tiredness, excessive sleep.  She also struggles with some anxiety symptoms, feeling nervous and so on on a regular basis.  She reports her panic attacks may have improved a little bit .  Patient continues to have several psychosocial stressors including health problems for daughters who struggle with seizure disorder, intracranial hypertension.  Patient's husband also struggles with depression.  Reports she has restarted psychotherapy sessions with Ms. Peacock.  She is motivated to stay in therapy.  Patient denies any suicidality homicidality or perceptual disturbances.   Visit Diagnosis:    ICD-10-CM   1. MDD (major depressive disorder), recurrent episode, moderate (HCC) F33.1 desvenlafaxine (PRISTIQ) 50 MG 24 hr tablet    OXcarbazepine (TRILEPTAL) 150 MG tablet  2. Panic disorder F41.0   3. 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 GAD (generalized anxiety disorder) F41.1     Past Psychiatric History: I have reviewed past psychiatric history from my progress note on 12/02/2017.  Past trials of medications like Prozac, Zoloft, Depakote, Cymbalta, Xanax, hydroxyzine, Klonopin, gabapentin, Lamictal, Lexapro, trazodone, Ambien, Effexor.  Past Medical  History:  Past Medical History:  Diagnosis Date  . Acute postoperative pain 04/07/2017  . Anxiety   . Bursitis   . Chronic fatigue 12/12/2017  . Edema leg 05/02/2015  . Fibromyalgia   . GERD (gastroesophageal reflux disease)   . IBS (irritable bowel syndrome)   . Knee pain, bilateral 12/21/2008   Qualifier: Diagnosis of  By: Hassell Done FNP, Tori Milks    . Lumbar discitis   . Osteoarthritis   . Right hand pain 04/10/2015   [facility] has done nerve conduction studies and ruled out carpal tunnel.   . Sleep apnea   . SVT (supraventricular tachycardia) ([place])   . Vertigo   . Vitamin D deficiency 05/01/2016    Past Surgical History:  Procedure Laterality Date  . ABLATION     Uterine  . CARDIAC CATHETERIZATION     with ablation  . COLONOSCOPY WITH PROPOFOL N/A 05/17/2015   Procedure: COLONOSCOPY WITH PROPOFOL;  Surgeon: Manya Silvas, MD;  Location: [facility] ENDOSCOPY;  Service: Endoscopy;  Laterality: N/A;  . ESOPHAGOGASTRODUODENOSCOPY N/A 05/17/2015   Procedure: ESOPHAGOGASTRODUODENOSCOPY (EGD);  Surgeon: Manya Silvas, MD;  Location: [facility] ENDOSCOPY;  Service: Endoscopy;  Laterality: N/A;  . KNEE ARTHROSCOPY    . spg     6/18  . Tibial Tubercle Bypass Right 1998  . TUBAL LIGATION  10/01/99    Family Psychiatric History: I have reviewed family psychiatric history from my progress note on 12/02/2017  Family History:  Family History  Problem Relation Age of Onset  . Depression Mother   . Hypertension Mother   . Cancer Mother        Skin  . Hyperlipidemia Mother   .  Anxiety disorder Mother   . Alcohol abuse Father   . Depression Father   . Stroke Father   . Heart disease Father   . Hypertension Father   . Anxiety disorder Father   . Depression Sister   . Hyperlipidemia Sister   . Diabetes Sister   . Hypertension Sister   . Polycystic ovary syndrome Sister   . Bipolar disorder Sister   . Anxiety disorder Sister   . Cancer Maternal Grandmother 61       Breast  . Thyroid  disease Maternal Grandmother   . Arthritis Maternal Grandmother   . Hyperlipidemia Maternal Grandmother   . Depression Sister   . Hypertension Sister   . Anxiety disorder Sister   . Alzheimer's disease Other   . Aneurysm Maternal Grandfather   . Hypertension Maternal Grandfather   . Heart disease Maternal Grandfather   . Alzheimer's disease Paternal Grandmother   . Heart attack Paternal Grandfather   . Hypertension Paternal Grandfather   . COPD Paternal Grandfather   . Heart disease Paternal Grandfather   . Bladder Cancer Neg Hx   . Kidney cancer Neg Hx     Social History: Reviewed social history from my progress note on 12/02/2017 Social History   Socioeconomic History  . Marital status: Married    Spouse name: brian  . Number of children: 3  . Years of education: Not on file  . Highest education level: Associate degree: academic program  Occupational History    Comment: not able  Social Needs  . Financial resource strain: Very hard  . Food insecurity:    Worry: Often true    Inability: Often true  . Transportation needs:    Medical: Yes    Non-medical: Yes  Tobacco Use  . Smoking status: Former Smoker    Packs/day: 4.00    Years: 3.00    Pack years: 12.00    Types: Cigarettes    Last attempt to quit: 12/10/1992    Years since quitting: 25.9  . Smokeless tobacco: Never Used  . Tobacco comment: quit 25 years ago  Substance and Sexual Activity  . Alcohol use: No    Alcohol/week: 0.0 standard drinks  . Drug use: No  . Sexual activity: Yes    Partners: Male  Lifestyle  . Physical activity:    Days per week: 0 days    Minutes per session: 0 min  . Stress: Rather much  Relationships  . Social connections:    Talks on phone: Never    Gets together: Never    Attends religious service: More than 4 times per year    Active member of club or organization: No    Attends meetings of clubs or organizations: Never    Relationship status: Married  Other Topics Concern   . Not on file  Social History Narrative  . Not on file    Allergies:  Allergies  Allergen Reactions  . Aspirin Swelling  . Cymbalta [Duloxetine Hcl] Other (See Comments)    Suicidal ideations and has homicidal thoughts per patient  . Depakote [Divalproex Sodium] Shortness Of Breath    W/ n/v  . Duloxetine Other (See Comments)    Suicidal idealation  . Haloperidol Shortness Of Breath    W/ n/v  . Iodinated Diagnostic Agents Other (See Comments)  . Iodine Shortness Of Breath  . Meperidine Nausea And Vomiting    Patient projectile vomits and usually result in ER  . Reglan [Metoclopramide] Shortness Of Breath  Can't breath, wheezes  . Tramadol Hcl Palpitations    Severely and adversely affects her SVT giving her tachycardias of 150-160 bpm.  . Trazodone Shortness Of Breath  . Compazine [Prochlorperazine] Other (See Comments)    Panic attack  . Meloxicam Other (See Comments)    mouth sores, tingling, blisters in mouth  . Penicillins Rash  . Tomato Hives    Tongue will blister  . Other   . Shellfish Allergy     Other reaction(s): Unknown  . Shellfish-Derived Products Other (See Comments)  . Bacitracin-Neomycin-Polymyxin Rash  . Cephalosporins Rash    rash  . Ibuprofen Other (See Comments) and Rash    Blisters in mouth. Blisters in mouth.  . Latex Itching  . Neosporin [Neomycin-Bacitracin Zn-Polymyx] Rash  . Nsaids Other (See Comments)    Blisters in mouth; can take 1 ibuprofen 2x a month  . Sulfa Antibiotics Rash    rash Other reaction(s): Unknown rash   . Sulfasalazine Rash  . Sulfonamide Derivatives Rash    Metabolic Disorder Labs: No results found for: HGBA1C, MPG No results found for: PROLACTIN Lab Results  Component Value Date   CHOL 206 (H) 03/05/2018   TRIG 441 (H) 03/05/2018   HDL 38 (L) 03/05/2018   CHOLHDL 5.4 (H) 03/05/2018   VLDL 53 (H) 05/02/2017   [facility]  03/05/2018     Comment:     . LDL cholesterol not calculated. Triglyceride  levels greater than 400 mg/dL invalidate calculated LDL results. . Reference range: <100 . Desirable range <100 mg/dL for primary prevention;   <70 mg/dL for patients with CHD or diabetic patients  with > or = 2 CHD risk factors. Marland Kitchen LDL-C is now calculated using the Martin-Hopkins  calculation, which is a validated novel method providing  better accuracy than the Friedewald equation in the  estimation of LDL-C.  Cresenciano Genre et al. Annamaria Helling. 4401;027(25): 2061-2068  (http://education.[employer].com/faq/FAQ164)    LDLCALC 74 12/15/2017   Lab Results  Component Value Date   TSH 2.63 05/02/2017   TSH 1.95 04/29/2016    Therapeutic Level Labs: No results found for: LITHIUM No results found for: VALPROATE No components found for:  CBMZ  Current Medications: Current Outpatient Medications  Medication Sig Dispense Refill  . acetaminophen (TYLENOL) 325 MG tablet Take 650 mg by mouth as needed.     Marland Kitchen atorvastatin (LIPITOR) 40 MG tablet Take 1 tablet (40 mg total) by mouth at bedtime. This replaces crestor (rosuvastatin) 30 tablet 2  . baclofen (LIORESAL) 10 MG tablet Take 1 tablet (10 mg total) by mouth 3 (three) times daily. 90 tablet 2  . Cyanocobalamin (VITAMIN B-12) 500 MCG SUBL Place 3 tablets (1,500 mcg total) under the tongue daily. 150 tablet   . Dexlansoprazole (DEXILANT) 30 MG capsule Take 1 capsule (30 mg total) by mouth daily. This replaces omeprazole 30 capsule 2  . diclofenac sodium (VOLTAREN) 1 % GEL Apply 2 g topically 4 (four) times daily. 1 Tube 2  . magnesium oxide (MAG-OX) 400 MG tablet Take 400 mg by mouth daily.    . metoprolol tartrate (LOPRESSOR) 50 MG tablet TAKE 1 TABLET BY MOUTH TWICE A DAY 180 tablet 3  . ondansetron (ZOFRAN) 4 MG tablet Take 1 tablet (4 mg total) every 8 (eight) hours as needed by mouth. 20 tablet 2  . OXcarbazepine (TRILEPTAL) 150 MG tablet Take 1 tablet (150 mg total) by mouth 2 (two) times daily. 60 tablet 2  . oxyCODONE (OXY  IR/ROXICODONE) 5 MG immediate  release tablet Take 5 mg by mouth every 4 (four) hours as needed for severe pain.    . pregabalin (LYRICA) 150 MG capsule Take 1 capsule (150 mg total) by mouth every 8 (eight) hours. 90 capsule 2  . desvenlafaxine (PRISTIQ) 50 MG 24 hr tablet Take 1 tablet (50 mg total) by mouth at bedtime. 30 tablet 1  . HYDROcodone-acetaminophen (NORCO) 7.5-325 MG tablet Take 1 tablet by mouth every 6 (six) hours as needed for severe pain. 30 tablet 0   No current facility-administered medications for this visit.      Musculoskeletal: Strength & Muscle Tone: within normal limits Gait & Station: normal Patient leans: N/A  Psychiatric Specialty Exam: Review of Systems  Psychiatric/Behavioral: Positive for depression. The patient is nervous/anxious.   All other systems reviewed and are negative.   Blood pressure 105/71, pulse 86, temperature 99 F (37.2 C), temperature source Oral, weight 217 lb 12.8 oz (98.8 kg).Body mass index is 38.58 kg/m.  General Appearance: Casual  Eye Contact:  Fair  Speech:  Clear and Coherent  Volume:  Normal  Mood:  Anxious and Depressed  Affect:  Congruent  Thought Process:  Goal Directed and Descriptions of Associations: Intact  Orientation:  Full (Time, Place, and Person)  Thought Content: Logical   Suicidal Thoughts:  No  Homicidal Thoughts:  No  Memory:  Immediate;   Fair Recent;   Fair Remote;   Fair  Judgement:  Fair  Insight:  Fair  Psychomotor Activity:  Normal  Concentration:  Concentration: Fair and Attention Span: Fair  Recall:  [employer] of Knowledge: Fair  Language: Fair  Akathisia:  No  Handed:  Right  AIMS (if indicated): denies tremors, rigidity,stiffness  Assets:  Communication Skills Desire for Improvement Housing  ADL's:  Intact  Cognition: WNL  Sleep:  excessive and restless on and off   Screenings: PHQ2-9     Office Visit from 09/04/2018 in [place] Office Visit from  07/30/2018 in [facility] Office Visit from 04/07/2018 in [place] Procedure visit from 03/19/2018 in [place] from 03/05/2018 in [place]  PHQ-2 Total Score  3  0  0  0  0  PHQ-9 Total Score  16  6  -  -  -       Assessment and Plan: Haydyn is a 48 yr old female who has history of depression, anxiety, IBS, migraine headaches, presented to clinic today for a follow-up visit.  Patient continues to have multiple psychosocial stressors including medical issues, medical problems in her family, financial issues and sewn.  Patient currently has health insurance plan and is receiving medications through medication assistance program.  Patient continues to need medication readjustment as she continues to struggle with mood symptoms.  We will continue plan as noted below.  Plan Depression-unstable Increase Pristiq to 50 mg p.o. nightly Continue Trileptal 150 mg p.o. twice daily Patient has started psychotherapy sessions with Ms. Peacock here in clinic.  For anxiety disorder-unstable Increase Pristiq as discussed above. Discussed adding BuSpar however she is not ready yet.  She would like to wait. Continue psychotherapy sessions.   For insomnia- unstable Patient reports sleep problems on and off as well as excessive sleep other times.  Will monitor closely. She will continue to use CPAP.  For agoraphobia- some improvement Patient will continue psychotherapy sessions.  Follow-up in clinic  in 4 weeks or sooner if needed.  I have spent atleast 15 minutes face to face with patient today. More than 50 % of the time was spent for psychoeducation and supportive psychotherapy and care coordination.  This note was generated in part or whole with voice recognition software. Voice recognition is usually quite accurate but there are  transcription errors that can and very often do occur. I apologize for any typographical errors that were not detected and corrected.        Ursula Alert, MD 11/24/2018, 4:44 PM

## 2018-11-25 ENCOUNTER — Encounter: Payer: Self-pay | Admitting: Family Medicine

## 2018-11-25 ENCOUNTER — Other Ambulatory Visit: Payer: Self-pay

## 2018-11-25 ENCOUNTER — Ambulatory Visit (INDEPENDENT_AMBULATORY_CARE_PROVIDER_SITE_OTHER): Payer: Self-pay | Admitting: Family Medicine

## 2018-11-25 VITALS — BP 118/72 | HR 80 | Temp 98.4°F | Resp 16 | Ht 63.0 in | Wt 213.4 lb

## 2018-11-25 DIAGNOSIS — M797 Fibromyalgia: Secondary | ICD-10-CM

## 2018-11-25 DIAGNOSIS — G43001 Migraine without aura, not intractable, with status migrainosus: Secondary | ICD-10-CM

## 2018-11-25 DIAGNOSIS — H538 Other visual disturbances: Secondary | ICD-10-CM

## 2018-11-25 MED ORDER — ONDANSETRON HCL 4 MG PO TABS: 4.0000 mg | ORAL_TABLET | Freq: Three times a day (TID) | ORAL | 2 refills | Status: DC | PRN

## 2018-11-25 NOTE — Progress Notes (Signed)
Name: Dana Bradley   MRN: [id number]    DOB: 06/13/1971   Date:11/25/2018       Progress Note  Subjective  Chief Complaint  Chief Complaint  Patient presents with  . Establish Care  . Migraine    referral to Neurology and sofran 4 mg    HPI  Pt presents to re-establish care with our clinic.  She switched PCP's due to insurance issues x1 visit, but has returned to our office.  She now has [facility].  She will be an ongoing patient of Dr. Delight Ovens, but needed to be seen as a new patient by myself first.    Migraines: Seeing Dr. Dossie Arbour and still having migraines frequently.  2-3 days between migraines, and the pain lasts 4-5 days.  Endorses nausea, diarrhea, photosensitivity, smell sensitivity, sometimes sound sensitivity, visual floaters/lightning strikes, olfactory hallucinations.  Is taking baclofen and lyrica.  Has tried trigger point injections, occipital nerve blocks, "SPG's", cervical epidurals, "RF treatments", elimination diet/trigger tracking, sleep hygiene, topiramate, sumatriptan, amitriptyline, ajovy.  We will refer to headache specialist today.  Does have blurred vision at times - due for eye examination and we will refer today.  CFS/FMS: She sleeps >12 hours a few days a week.  She really struggles with fatigue.  She is really struggling with chronic pain as well.  She is starting counseling soon.  She has not attended PT due to cost, but we will refer to them today as a part of first line FMS therapy.  She does note that she has trouble cooking, picking up pots and pans, etc, so we will refer to OT as well for evaluation.  Patient Active Problem List   Diagnosis Date Noted  . Malar rash 09/04/2018  . Trigger point of neck (Left) 03/19/2018  . Occipital headache 12/25/2017  . Low serum cortisol level ([employer]) 12/12/2017  . Chronic fatigue syndrome with fibromyalgia 12/12/2017  . Trigger point of shoulder region (Left) 11/17/2017  . Myofascial pain syndrome  (Left) (trapezius muscle) 07/22/2017  . Lumbar L1-2 disc protrusion (Right) 04/07/2017  . Acute postoperative pain 04/07/2017  . Muscle spasticity 04/01/2017  . 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 Lumbar spondylosis 01/06/2017  . Chronic left hip pain 12/24/2016  . Chronic sacroiliac joint pain (Left) 12/24/2016  . Lumbar facet joint syndrome (B) (L>R) 12/24/2016  . Left lumbar radiculitis 12/24/2016  . Hypertriglyceridemia 11/27/2016  . History of vasovagal episode 10/30/2016  . Cervicogenic headache 09/09/2016  . Medication monitoring encounter 08/29/2016  . Controlled substance agreement signed 08/28/2016  . Plantar fasciitis of left foot 08/28/2016  . Vitamin B12 deficiency 08/28/2016  . Hyperlipidemia 08/28/2016  . Nephrolithiasis 08/12/2016  . Chronic pain syndrome 08/07/2016  . Long term prescription opiate use 08/07/2016  . Opiate use 08/07/2016  . Long term prescription benzodiazepine use 08/07/2016  . Neurogenic pain 08/07/2016  . Chronic low back pain (Primary Source of Pain) (Bilateral) (R>L) (midline) 08/07/2016  . Chronic upper back pain (Secondary source of pain) (Bilateral) (L>R) 08/07/2016  . Chronic abdominal pain (Right lower quadrant) 08/07/2016  . Thoracic radiculitis (Right) (T11 dermatome) 08/07/2016  . Chronic occipital neuralgia [facility] source of pain) (Bilateral) (L>R) 08/07/2016  . Chronic neck pain 08/07/2016  . Chronic cervical radicular pain (Bilateral) (L>R) 08/07/2016  . Chronic shoulder blade pain (Bilateral) (L>R) 08/07/2016  . Chronic upper extremity pain (Bilateral) (R>L) 08/07/2016  . Chronic knee pain (Bilateral) (R>L) 08/07/2016  . Chronic ankle pain (Bilateral) 08/07/2016  . Cervical  spondylosis with myelopathy and radiculopathy 08/07/2016  . Panic disorder with agoraphobia 05/29/2016  . Depression, unspecified depression type 05/29/2016  . Atypical lymphocytosis 05/01/2016  . Vitamin D insufficiency 05/01/2016  . Chronic  lower extremity cramps (Bilateral) (R>L) 04/29/2016  . Obesity 04/29/2016  . GAD (generalized anxiety disorder) 04/29/2016  . Fatigue 04/29/2016  . Insomnia 07/12/2015  . Migraine without aura and with status migrainosus, not intractable 07/12/2015  . Chronic superficial gastritis 06/02/2015  . Chronic pain of multiple joints 05/15/2015  . Bilateral leg edema 05/02/2015  . Paroxysmal supraventricular tachycardia ([place]) 04/17/2015  . Exertional shortness of breath 04/17/2015  . Bright red rectal bleeding 04/06/2015  . DDD (degenerative disc disease), lumbosacral 01/24/2014  . DDD (degenerative disc disease), lumbar 01/24/2014  . Cervico-occipital neuralgia 12/29/2013  . Fibromyalgia 12/29/2013  . Migraine headache 12/29/2013  . Menorrhagia 12/10/2012  . Depression, major, recurrent, in remission ([place]) 01/12/2009  . Chest pain 01/12/2009  . Hypertension, benign essential, goal below 140/90 06/23/2008  . History of PSVT (paroxysmal supraventricular tachycardia) 06/17/2008  . Obstructive sleep apnea, adult 06/17/2008  . GERD 06/13/2008    Past Surgical History:  Procedure Laterality Date  . ABLATION     Uterine  . CARDIAC CATHETERIZATION     with ablation  . COLONOSCOPY WITH PROPOFOL N/A 05/17/2015   Procedure: COLONOSCOPY WITH PROPOFOL;  Surgeon: Manya Silvas, MD;  Location: [facility] ENDOSCOPY;  Service: Endoscopy;  Laterality: N/A;  . ESOPHAGOGASTRODUODENOSCOPY N/A 05/17/2015   Procedure: ESOPHAGOGASTRODUODENOSCOPY (EGD);  Surgeon: Manya Silvas, MD;  Location: [facility] ENDOSCOPY;  Service: Endoscopy;  Laterality: N/A;  . KNEE ARTHROSCOPY    . spg     6/18  . Tibial Tubercle Bypass Right 1998  . TUBAL LIGATION  10/01/99    Family History  Problem Relation Age of Onset  . Depression Mother   . Hypertension Mother   . Cancer Mother        Skin  . Hyperlipidemia Mother   . Anxiety disorder Mother   . Alcohol abuse Father   . Depression Father   . Stroke Father   . Heart  disease Father   . Hypertension Father   . Anxiety disorder Father   . Depression Sister   . Hyperlipidemia Sister   . Diabetes Sister   . Hypertension Sister   . Polycystic ovary syndrome Sister   . Bipolar disorder Sister   . Anxiety disorder Sister   . Cancer Maternal Grandmother 13       Breast  . Thyroid disease Maternal Grandmother   . Arthritis Maternal Grandmother   . Hyperlipidemia Maternal Grandmother   . Depression Sister   . Hypertension Sister   . Anxiety disorder Sister   . Alzheimer's disease Other   . Aneurysm Maternal Grandfather   . Hypertension Maternal Grandfather   . Heart disease Maternal Grandfather   . Alzheimer's disease Paternal Grandmother   . Heart attack Paternal Grandfather   . Hypertension Paternal Grandfather   . COPD Paternal Grandfather   . Heart disease Paternal Grandfather   . Bladder Cancer Neg Hx   . Kidney cancer Neg Hx     Social History   Socioeconomic History  . Marital status: Married    Spouse name: brian  . Number of children: 3  . Years of education: Not on file  . Highest education level: Associate degree: academic program  Occupational History  . Occupation: disbled    Comment: not able  Social Needs  . Emergency planning/management officer  strain: Very hard  . Food insecurity:    Worry: Often true    Inability: Often true  . Transportation needs:    Medical: Yes    Non-medical: Yes  Tobacco Use  . Smoking status: Former Smoker    Packs/day: 4.00    Years: 3.00    Pack years: 12.00    Types: Cigarettes    Last attempt to quit: 12/10/1992    Years since quitting: 25.9  . Smokeless tobacco: Never Used  . Tobacco comment: quit 25 years ago  Substance and Sexual Activity  . Alcohol use: No    Alcohol/week: 0.0 standard drinks  . Drug use: No  . Sexual activity: Not Currently    Partners: Male  Lifestyle  . Physical activity:    Days per week: 0 days    Minutes per session: 0 min  . Stress: Rather much  Relationships  .  Social connections:    Talks on phone: Never    Gets together: Never    Attends religious service: More than 4 times per year    Active member of club or organization: No    Attends meetings of clubs or organizations: Never    Relationship status: Married  . Intimate partner violence:    Fear of current or ex partner: No    Emotionally abused: No    Physically abused: No    Forced sexual activity: No  Other Topics Concern  . Not on file  Social History Narrative  . Not on file     Current Outpatient Medications:  .  acetaminophen (TYLENOL) 325 MG tablet, Take 650 mg by mouth as needed. , Disp: , Rfl:  .  atorvastatin (LIPITOR) 40 MG tablet, Take 1 tablet (40 mg total) by mouth at bedtime. This replaces crestor (rosuvastatin), Disp: 30 tablet, Rfl: 2 .  baclofen (LIORESAL) 10 MG tablet, Take 1 tablet (10 mg total) by mouth 3 (three) times daily., Disp: 90 tablet, Rfl: 2 .  Cyanocobalamin (VITAMIN B-12) 500 MCG SUBL, Place 3 tablets (1,500 mcg total) under the tongue daily., Disp: 150 tablet, Rfl:  .  desvenlafaxine (PRISTIQ) 50 MG 24 hr tablet, Take 1 tablet (50 mg total) by mouth at bedtime., Disp: 30 tablet, Rfl: 1 .  Dexlansoprazole (DEXILANT) 30 MG capsule, Take 1 capsule (30 mg total) by mouth daily. This replaces omeprazole, Disp: 30 capsule, Rfl: 2 .  diclofenac sodium (VOLTAREN) 1 % GEL, Apply 2 g topically 4 (four) times daily., Disp: 1 Tube, Rfl: 2 .  HYDROcodone-acetaminophen (NORCO) 7.5-325 MG tablet, Take 1 tablet by mouth every 6 (six) hours as needed for severe pain., Disp: 30 tablet, Rfl: 0 .  magnesium oxide (MAG-OX) 400 MG tablet, Take 400 mg by mouth daily., Disp: , Rfl:  .  metoprolol tartrate (LOPRESSOR) 50 MG tablet, TAKE 1 TABLET BY MOUTH TWICE A DAY, Disp: 180 tablet, Rfl: 3 .  ondansetron (ZOFRAN) 4 MG tablet, Take 1 tablet (4 mg total) every 8 (eight) hours as needed by mouth., Disp: 20 tablet, Rfl: 2 .  OXcarbazepine (TRILEPTAL) 150 MG tablet, Take 1 tablet  (150 mg total) by mouth 2 (two) times daily., Disp: 60 tablet, Rfl: 2 .  oxyCODONE (OXY IR/ROXICODONE) 5 MG immediate release tablet, Take 5 mg by mouth every 4 (four) hours as needed for severe pain., Disp: , Rfl:  .  pregabalin (LYRICA) 150 MG capsule, Take 1 capsule (150 mg total) by mouth every 8 (eight) hours., Disp: 90 capsule, Rfl: 2  Allergies  Allergen Reactions  . Aspirin Swelling  . Cymbalta [Duloxetine Hcl] Other (See Comments)    Suicidal ideations and has homicidal thoughts per patient  . Depakote [Divalproex Sodium] Shortness Of Breath    W/ n/v  . Duloxetine Other (See Comments)    Suicidal idealation  . Haloperidol Shortness Of Breath    W/ n/v  . Iodinated Diagnostic Agents Other (See Comments)  . Iodine Shortness Of Breath  . Meperidine Nausea And Vomiting    Patient projectile vomits and usually result in ER  . Reglan [Metoclopramide] Shortness Of Breath    Can't breath, wheezes  . Tramadol Hcl Palpitations    Severely and adversely affects her SVT giving her tachycardias of 150-160 bpm.  . Trazodone Shortness Of Breath  . Compazine [Prochlorperazine] Other (See Comments)    Panic attack  . Meloxicam Other (See Comments)    mouth sores, tingling, blisters in mouth  . Penicillins Rash  . Tomato Hives    Tongue will blister  . Other   . Shellfish Allergy     Other reaction(s): Unknown  . Shellfish-Derived Products Other (See Comments)  . Bacitracin-Neomycin-Polymyxin Rash  . Cephalosporins Rash    rash  . Ibuprofen Other (See Comments) and Rash    Blisters in mouth. Blisters in mouth.  . Latex Itching  . Neosporin [Neomycin-Bacitracin Zn-Polymyx] Rash  . Nsaids Other (See Comments)    Blisters in mouth; can take 1 ibuprofen 2x a month  . Sulfa Antibiotics Rash    rash Other reaction(s): Unknown rash   . Sulfasalazine Rash  . Sulfonamide Derivatives Rash    I personally reviewed active problem list, medication list, allergies, notes from last  encounter, lab results with the patient/caregiver today.   ROS Ten systems reviewed and is negative except as mentioned in HPI.  Objective  Vitals:   11/25/18 1106  BP: 118/72  Pulse: 80  Resp: 16  Temp: 98.4 F (36.9 C)  TempSrc: Oral  SpO2: 95%  Weight: 213 lb 6.4 oz (96.8 kg)  Height: 5\' 3"  (1.6 m)    Body mass index is 37.8 kg/m.  Physical Exam Constitutional: Patient appears well-developed and well-nourished. No distress.  HENT: Head: Normocephalic and atraumatic. Eyes: Conjunctivae and EOM are normal. Neck: Normal range of motion. Neck supple. No JVD present. No thyromegaly present.  Cardiovascular: Normal rate, regular rhythm and normal heart sounds.  No murmur heard. No BLE edema. Pulmonary/Chest: Effort normal and breath sounds normal. No respiratory distress. Musculoskeletal: Normal range of motion, no joint effusions. No gross deformities. Walks with cane. Neurological: Pt is alert and oriented to person, place, and time. No cranial nerve deficit. Coordination, balance, strength, speech and gait are normal.  Skin: Skin is warm and dry. No rash noted. No erythema.  Psychiatric: Patient has a normal mood and affect. behavior is normal. Judgment and thought content normal.  No results found for this or any previous visit (from the past 72 hour(s)).  PHQ2/9: Depression screen [facility] 2/9 11/25/2018 09/04/2018 07/30/2018 04/07/2018 03/19/2018  Decreased Interest 2 2 0 0 0  Down, Depressed, Hopeless 1 1 0 0 0  PHQ - 2 Score 3 3 0 0 0  Altered sleeping 3 3 2  - -  Tired, decreased energy 3 3 2  - -  Change in appetite 2 3 1  - -  Feeling bad or failure about yourself  1 2 1  - -  Trouble concentrating 3 1 0 - -  Moving slowly or fidgety/restless 0  0 0 - -  Suicidal thoughts 0 1 0 - -  PHQ-9 Score 15 16 6  - -  Difficult doing work/chores Very difficult Somewhat difficult Not difficult at all - -  Some recent data might be hidden    Fall Risk: Fall Risk  11/25/2018  11/18/2018 09/04/2018 07/30/2018 04/07/2018  Falls in the past year? 0 0 1 Yes No  Comment - - - - -  Number falls in past yr: 0 - 1 2 or more -  Injury with Fall? 0 - 0 No -  Risk Factor Category  - - - - -  Comment - - - - -  Risk for fall due to : - - - - -  Risk for fall due to: Comment - - - - -  Follow up Falls evaluation completed - - - -   Assessment & Plan  1. Migraine without aura and with status migrainosus, not intractable - Ambulatory referral to Neurology - ondansetron (ZOFRAN) 4 MG tablet; Take 1 tablet (4 mg total) by mouth every 8 (eight) hours as needed.  Dispense: 20 tablet; Refill: 2 - Ambulatory referral to Ophthalmology  2. Blurred vision - Ambulatory referral to Ophthalmology  3. Fibromyalgia - Ambulatory referral to Physical Therapy - Ambulatory referral to Occupational Therapy  Return in about 1 month (around 12/24/2018) for follow up w/ Dr. Sanda Klein.

## 2018-11-25 NOTE — Addendum Note (Signed)
Addended by: Hubbard Hartshorn on: 11/25/2018 12:06 PM   Modules accepted: Orders

## 2018-11-26 ENCOUNTER — Other Ambulatory Visit: Payer: Self-pay

## 2018-11-26 MED ORDER — DEXLANSOPRAZOLE 30 MG PO CPDR: 30.0000 mg | DELAYED_RELEASE_CAPSULE | Freq: Every day | ORAL | 2 refills | Status: DC

## 2018-11-26 NOTE — Telephone Encounter (Signed)
Rx sent Patient re-established, saw E. Uvaldo Rising, NP

## 2018-12-08 ENCOUNTER — Telehealth: Payer: Self-pay

## 2018-12-08 NOTE — Telephone Encounter (Signed)
pt called left a message that she needs all her medications to go to [facility]   fax # [phone]   Pt states that rx was sent to wrong place.

## 2018-12-08 NOTE — Telephone Encounter (Signed)
Hi Dana Bradley this patient's scripts were printed out and was asked to be faxed when she came in to office that day. You had them. Do you still have the originals with you to fax it to the new place that she wants [place] to ?

## 2018-12-09 ENCOUNTER — Ambulatory Visit: Payer: Medicaid Other | Admitting: Pharmacist

## 2018-12-09 ENCOUNTER — Ambulatory Visit: Payer: Medicaid Other | Admitting: Pharmacy Technician

## 2018-12-09 DIAGNOSIS — Z79899 Other long term (current) drug therapy: Secondary | ICD-10-CM

## 2018-12-09 NOTE — Progress Notes (Addendum)
  Completed [facility] application and contract.  Patient agreed to all terms of the [facility] Clinic contract.    Patient approved to receive medication assistance at [facility] as long as eligibility criteria continues to be met.    Provided patient with community resource material based on her particular needs.    Patient indicated that [facility] has completed PAP applications for Pristiq and Dexilant.  However, they require that patient go to their facility to pick-up the medication.  Patient stated that she lives in [place] and it is a challenge to get to [place] to pick-up her medications. Patient is now seeing Dr. Sanda Klein at [facility] & Dr. Shea Evans at [facility].  Patient has requested that [facility] take over the management of her PAP Applications.  Pristiq & Dexilant Prescription Applications completed with patient.  Dexilant forwarded to Dr. Sanda Klein for signature & Pristiq forwarded to Dr. Shea Evans for signature.  Spoke with Arlys John at [place] with [place].  Made Crystal aware that patient wishes [facility] to take over the administration of PAP applications.  Crystal stated that they would no longer manage those PAP applications since patient our clinic is more convenient for patient and patient is seeing a new provider.  Upon receipt of signed applications from providers, Pristiq Prescription Application will be submitted to [employer] and Dexilant Prescription Application will be submitted to [place].  Patient referred for MTM & referrals in [facility].  [facility]

## 2018-12-09 NOTE — Progress Notes (Signed)
Medication Management Clinic Visit Note  Patient: Dana Bradley MRN: [id number] Date of Birth: 1971-07-24 PCP: Hubbard Hartshorn, FNP   [name] 48 y.o. female presents for an initial visit today.  LMP 11/11/2018   Patient Information   Past Medical History:  Diagnosis Date  . Acute postoperative pain 04/07/2017  . Anxiety   . Bursitis   . Chronic fatigue 12/12/2017  . Edema leg 05/02/2015  . Fibromyalgia   . GERD (gastroesophageal reflux disease)   . IBS (irritable bowel syndrome)   . Knee pain, bilateral 12/21/2008   Qualifier: Diagnosis of  By: Hassell Done FNP, Tori Milks    . Lumbar discitis   . Osteoarthritis   . Right hand pain 04/10/2015   [facility] Neurology has done nerve conduction studies and ruled out carpal tunnel.   . Sleep apnea   . SVT (supraventricular tachycardia) ([place])   . Vertigo   . Vitamin D deficiency 05/01/2016      Past Surgical History:  Procedure Laterality Date  . ABLATION     Uterine  . CARDIAC CATHETERIZATION     with ablation  . COLONOSCOPY WITH PROPOFOL N/A 05/17/2015   Procedure: COLONOSCOPY WITH PROPOFOL;  Surgeon: Manya Silvas, MD;  Location: [facility] ENDOSCOPY;  Service: Endoscopy;  Laterality: N/A;  . ESOPHAGOGASTRODUODENOSCOPY N/A 05/17/2015   Procedure: ESOPHAGOGASTRODUODENOSCOPY (EGD);  Surgeon: Manya Silvas, MD;  Location: [facility] ENDOSCOPY;  Service: Endoscopy;  Laterality: N/A;  . KNEE ARTHROSCOPY    . spg     6/18  . Tibial Tubercle Bypass Right 1998  . TUBAL LIGATION  10/01/99     Family History  Problem Relation Age of Onset  . Depression Mother   . Hypertension Mother   . Cancer Mother        Skin  . Hyperlipidemia Mother   . Anxiety disorder Mother   . Alcohol abuse Father   . Depression Father   . Stroke Father   . Heart disease Father   . Hypertension Father   . Anxiety disorder Father   . Depression Sister   . Hyperlipidemia Sister   . Diabetes Sister   . Hypertension Sister   . Polycystic ovary syndrome Sister   .  Bipolar disorder Sister   . Anxiety disorder Sister   . Cancer Maternal Grandmother 9       Breast  . Thyroid disease Maternal Grandmother   . Arthritis Maternal Grandmother   . Hyperlipidemia Maternal Grandmother   . Depression Sister   . Hypertension Sister   . Anxiety disorder Sister   . Alzheimer's disease Other   . Aneurysm Maternal Grandfather   . Hypertension Maternal Grandfather   . Heart disease Maternal Grandfather   . Alzheimer's disease Paternal Grandmother   . Heart attack Paternal Grandfather   . Hypertension Paternal Grandfather   . COPD Paternal Grandfather   . Heart disease Paternal Grandfather   . Bladder Cancer Neg Hx   . Kidney cancer Neg Hx     New Diagnoses (since last visit): N/A  Family Support: husband and children "mean well"   Lifestyle  Barriers: Cane/walker, cannot see with migraines; starting PT soon hopefully  Diet: Working on portion sizes  Largely vegetarian diet and attempting to move to totally vegetarian.  Outpatient Encounter Medications as of 12/09/2018  Medication Sig  . acetaminophen (TYLENOL) 325 MG tablet Take 650 mg by mouth as needed.   Marland Kitchen atorvastatin (LIPITOR) 40 MG tablet Take 1 tablet (40 mg total) by mouth at bedtime.  This replaces crestor (rosuvastatin)  . baclofen (LIORESAL) 10 MG tablet Take 1 tablet (10 mg total) by mouth 3 (three) times daily.  . Cholecalciferol (D3-1000 PO) Take 1,000 mg by mouth daily.  . Cyanocobalamin (VITAMIN B-12) 500 MCG SUBL Place 3 tablets (1,500 mcg total) under the tongue daily.  Marland Kitchen desvenlafaxine (PRISTIQ) 50 MG 24 hr tablet Take 1 tablet (50 mg total) by mouth at bedtime.  Marland Kitchen Dexlansoprazole (DEXILANT) 30 MG capsule Take 1 capsule (30 mg total) by mouth daily. This replaces omeprazole  . diclofenac sodium (VOLTAREN) 1 % GEL Apply 2 g topically 4 (four) times daily.  . magnesium oxide (MAG-OX) 400 MG tablet Take 300 mg by mouth daily.   . Melatonin 3 MG TABS Take 3 mg by mouth Nightly.  .  metoprolol tartrate (LOPRESSOR) 50 MG tablet TAKE 1 TABLET BY MOUTH TWICE A DAY (Patient taking differently: Take 25-50 mg by mouth 2 (two) times daily. Patient bases it off how she feels)  . ondansetron (ZOFRAN) 4 MG tablet Take 1 tablet (4 mg total) by mouth every 8 (eight) hours as needed.  . OXcarbazepine (TRILEPTAL) 150 MG tablet Take 1 tablet (150 mg total) by mouth 2 (two) times daily.  Marland Kitchen oxyCODONE (OXY IR/ROXICODONE) 5 MG immediate release tablet Take 5 mg by mouth every 4 (four) hours as needed for severe pain.  . pregabalin (LYRICA) 150 MG capsule Take 1 capsule (150 mg total) by mouth every 8 (eight) hours.  Marland Kitchen HYDROcodone-acetaminophen (NORCO) 7.5-325 MG tablet Take 1 tablet by mouth every 6 (six) hours as needed for severe pain.   No facility-administered encounter medications on file as of 12/09/2018.    Upcoming Visits: PT rehab evaluation: 12/24/2018 Neurology: 5/4 Pain clinic: 5/19         Social History   Substance and Sexual Activity  Alcohol Use No  . Alcohol/week: 0.0 standard drinks      Social History   Tobacco Use  Smoking Status Former Smoker  . Packs/day: 4.00  . Years: 3.00  . Pack years: 12.00  . Types: Cigarettes  . Last attempt to quit: 12/10/1992  . Years since quitting: 26.0  Smokeless Tobacco Never Used  Tobacco Comment   quit 25 years ago      Health Maintenance  Topic Date Due  . INFLUENZA VACCINE  04/30/2018  . PAP SMEAR-Modifier  09/22/2019  . TETANUS/TDAP  09/30/2021  . HIV Screening  Completed     Assessment and Plan: Compliance: Does not miss medications regularly. Takes morning meds and uses an alarm for nighttime meds. Sometimes miss the middle of the day Lyrica. She sometimes changes how she takes metoprolol and Dexilant (2 days a week missing).  HLD: The patient currently out of atorvastatin, but per her last labs TG and LDL are elevated. It may be [facility] at [facility].  Anxiety: Currently taking Pristiq, but has  been on venlafaxine. Pristiq 30mg  will be changed to 50mg  soon. She is worried about having to go without that medicine, so she would be willing to change back to venlafaxine since we have that in stock, if her doctor thinks that is a good idea for her. She has a lot of stress due to her health and the health of her husband and 3 children.  GERD:  Concerns with C. Diff and Dexilant because her mother in law had a colostomy due to taking a PPI for so many years, so that is why she sometimes chooses not to take  it, even if she is having bad reflux.   Migraines: She has an upcoming visit with her neurologist, but she worries that there is nothing left for them to do because nothing helps.   Mrs. Rabbani is very on top of her medications and understands why she is taking them all.   RTC: 1 year   Florinda Marker, [place]

## 2018-12-10 ENCOUNTER — Telehealth: Payer: Self-pay | Admitting: Family Medicine

## 2018-12-10 DIAGNOSIS — E782 Mixed hyperlipidemia: Secondary | ICD-10-CM

## 2018-12-10 DIAGNOSIS — I10 Essential (primary) hypertension: Secondary | ICD-10-CM

## 2018-12-10 MED ORDER — METOPROLOL TARTRATE 50 MG PO TABS: 50.0000 mg | ORAL_TABLET | Freq: Two times a day (BID) | ORAL | 1 refills | Status: DC

## 2018-12-10 MED ORDER — ATORVASTATIN CALCIUM 40 MG PO TABS: 40.0000 mg | ORAL_TABLET | Freq: Every day | ORAL | 1 refills | Status: DC

## 2018-12-10 NOTE — Telephone Encounter (Addendum)
Received the below message.  Atorvastatin and Metoprolol are filled. Baclofen and Voltaren gel need to be filed by Dionisio David NP-C who provides pain management services for the patient.  ----- Message from Jacquelynn Cree sent at 12/10/2018 11:01 AM EDT ----- The following patient has reached out to [facility] to provide medication assistance.  We have received prescriptions for some of the medications that she is taking.  However, we need prescriptions for the following medications:  Atorvastatin, Metoprolol, Baclofen & Voltaren gel. Thank you.

## 2018-12-11 ENCOUNTER — Other Ambulatory Visit: Payer: Self-pay

## 2018-12-15 ENCOUNTER — Ambulatory Visit (INDEPENDENT_AMBULATORY_CARE_PROVIDER_SITE_OTHER): Payer: Self-pay | Admitting: Licensed Clinical Social Worker

## 2018-12-15 ENCOUNTER — Telehealth: Payer: Self-pay | Admitting: Psychiatry

## 2018-12-15 ENCOUNTER — Other Ambulatory Visit: Payer: Self-pay

## 2018-12-15 DIAGNOSIS — F331 Major depressive disorder, recurrent, moderate: Secondary | ICD-10-CM

## 2018-12-15 DIAGNOSIS — F411 Generalized anxiety disorder: Secondary | ICD-10-CM

## 2018-12-18 ENCOUNTER — Telehealth: Payer: Self-pay | Admitting: Pharmacist

## 2018-12-18 NOTE — Telephone Encounter (Signed)
12/18/2018 10:19:25 AM - Dexilant pending  0/96/0454 Dexilant application is pending-holding to receive paperwork back from provider-dropped off at [facility] 12/10/2018, went by their office on 12/17/2018 for has not been signed, Dr. Sanda Klein has been out of office, they will call me when ready to pickup.AJ   12/18/2018 10:17:39 AM - Pristiq  12/18/2018 Faxed Pf[employer] application for Pristiq 50mg  take one daily.Delos Haring

## 2018-12-23 ENCOUNTER — Ambulatory Visit: Payer: Medicaid Other | Admitting: Psychiatry

## 2018-12-24 ENCOUNTER — Encounter: Payer: Self-pay | Admitting: Physical Therapy

## 2018-12-24 ENCOUNTER — Encounter: Payer: Self-pay | Admitting: *Deleted

## 2018-12-24 ENCOUNTER — Telehealth: Payer: Self-pay | Admitting: *Deleted

## 2018-12-24 NOTE — Telephone Encounter (Signed)
Called pt's home, asked for call back when able. Will offer video visit with Dr. Jaynee Eagles when she calls back. She is on wait list.

## 2018-12-24 NOTE — Addendum Note (Signed)
Addended by: Gildardo Griffes on: 12/24/2018 04:32 PM   Modules accepted: Orders

## 2018-12-24 NOTE — Telephone Encounter (Addendum)
Patient on wait list with 02/01/2019 appt. I called and offered pt a sooner appt via webex video meeting app d/t current situation with [phone]. Advised that although there may be some limitations with this type of visit, we will take all precautions to reduce any security or privacy concerns. This will be treated like an in-office visit and we will file with her insurance, and there may be a patient responsible charge related to this service. She consented to the video visit & has a [facility] financial assistance charity letter which she will fax to our office. I updated her med/fam/sx/social/med/allergy history sections of his chart. She provided her email address (see below). Pt advised to please call us if she does not receive email from the system to join meeting. She can join meeting 5 minutes prior to visit. I confirmed her email address which is already on file.   Patient gave this info for Dr. Jaynee Eagles: When she turns her head to the L she will black out. She has to be careful not to go too fast when she has a migraine. She gets a migraine about every 2 days. Sometimes she can go three or four days without one. They last anywhere from 16-48 hours. Things tried for migraines: Elavil, Topamax, Sumatriptan, trigger point injections, occipital nerve blocks, SPGs, cervical epidurals, RF treatment 2019.   Scheduled pt for video visit with Dr. Jaynee Eagles on 12/25/2018 and canceled May appt, removed from wait list. Pt verbalized appreciation.

## 2018-12-25 ENCOUNTER — Ambulatory Visit (INDEPENDENT_AMBULATORY_CARE_PROVIDER_SITE_OTHER): Payer: Self-pay | Admitting: Neurology

## 2018-12-25 ENCOUNTER — Other Ambulatory Visit: Payer: Self-pay

## 2018-12-25 DIAGNOSIS — M542 Cervicalgia: Secondary | ICD-10-CM

## 2018-12-25 DIAGNOSIS — M7918 Myalgia, other site: Secondary | ICD-10-CM

## 2018-12-25 DIAGNOSIS — G43711 Chronic migraine without aura, intractable, with status migrainosus: Secondary | ICD-10-CM

## 2018-12-25 NOTE — Progress Notes (Signed)
[facility]    Provider:  Dr Jaynee Eagles Requesting Provider: Hubbard Hartshorn, FNP Primary Care Provider:  Hubbard Hartshorn, FNP  CC:  Chronic migraines    Virtual Visit via Video Note  I connected with patient, Dana Bradley,  on 12/27/18 at 11:30 AM EDT by a video enabled telemedicine application and verified that I am speaking with the correct person using two identifiers.   I discussed the limitations of evaluation and management by telemedicine and the availability of in person appointments. The patient expressed understanding and agreed to proceed.  Melvenia Beam, MD  HPI:  Dana Bradley is a 48 y.o. female here as requested by Hubbard Hartshorn, FNP for migraines and visual disturbances. PMHx migraines and visual disturbances, anxiety, chronic fatigue, Fibromyalgia, IBS, sleep apnea, vertigo, obesity, chronic pain uses a walker.  Had first migraine when pregnant. They were fine until 2008 and headaches/migraines regularly once a week, bad but bearable. Worsening ove rthe years. The last 4 years having daily migraines that last 24-48 hours or sometimes up to 3 days in a row for the same migraine. No aura.  No pattern for when they start, the pain wakes her up and she wakes up in the morning. But can start int he middle of the afternoon. Unknown triggers. Starts in the back of the neck on the left side, runs over the head to her eye and to the left side of the head, it is pulsating/throbbing, waves of pain, nausea, light and sound sensitivity, vomiting, movement makes it worse and a dark room may help a little, radiates to the arm and shoulder blade. She cries. They are severe or moderately severe. She has neck pain.No difference since 2016 since last MRI in frequency or severity or quality, discussed MRI but since no changes we agreed to hold off but keep in mind. She is very compliant with her cpap. Ajovy helped initially but by month 4 it was the same pain. Migraines can last  24-72 hours or longer on average 48 hours. No medication overuse. She doesn;t take anythng because nothing works. Tylenol 3x a month. No aura. No medication overuse.No other focal neurologic deficits, associated symptoms, inciting events or modifiable factors.  Reviewed notes, labs and imaging from outside physicians, which showed:   Migraine meds tried: baclofen, metoprolol, zofran, trileptal, lyrica, sumatriptan, elavil, ajovy, Topamax, amitriptyline, nortriptyline, SPG, RFA on the occipital nerve, trigger point.   CT 12/2014 and MRI brain wo 01/2015  showed no acute intracranial abnormalities including mass lesion or mass effect, hydrocephalus, extra-axial fluid collection, midline shift, hemorrhage, or acute infarction, large ischemic events (personally reviewed images)  ANA, CRP, Sed Rate, cbc normal   Appears symptoms ongoing for years. In 2016 had headaches, visual disturbances, olfactory hallucinations, numbness and tingling of hands and feet and had an evaluation to include MRI brain    Review of Systems: Patient complains of symptoms per HPI as well as the following symptoms: headaches, sleep apnea. Pertinent negatives and positives per HPI. All others negative.   Social History   Socioeconomic History  . Marital status: Married    Spouse name: brian  . Number of children: 3  . Years of education: Not on file  . Highest education level: Associate degree: academic program  Occupational History  . Occupation: disbled    Comment: not able  Social Needs  . Financial resource strain: Very hard  . Food insecurity:    Worry: Often true    Inability:  Often true  . Transportation needs:    Medical: Yes    Non-medical: Yes  Tobacco Use  . Smoking status: Former Smoker    Packs/day: 4.00    Years: 3.00    Pack years: 12.00    Types: Cigarettes    Last attempt to quit: 12/10/1992    Years since quitting: 26.0  . Smokeless tobacco: Never Used  . Tobacco comment: quit 25  years ago  Substance and Sexual Activity  . Alcohol use: No    Alcohol/week: 0.0 standard drinks  . Drug use: No  . Sexual activity: Not Currently    Partners: Male  Lifestyle  . Physical activity:    Days per week: 0 days    Minutes per session: 0 min  . Stress: Rather much  Relationships  . Social connections:    Talks on phone: Never    Gets together: Never    Attends religious service: More than 4 times per year    Active member of club or organization: No    Attends meetings of clubs or organizations: Never    Relationship status: Married  . Intimate partner violence:    Fear of current or ex partner: No    Emotionally abused: No    Physically abused: No    Forced sexual activity: No  Other Topics Concern  . Not on file  Social History Narrative   Lives at home with her husband and 2 of her children   Right handed   Caffeine: 0-1 cups daily    Family History  Problem Relation Age of Onset  . Depression Mother   . Hypertension Mother   . Cancer Mother        Skin  . Hyperlipidemia Mother   . Anxiety disorder Mother   . Migraines Mother   . Alcohol abuse Father   . Depression Father   . Stroke Father   . Heart disease Father   . Hypertension Father   . Anxiety disorder Father   . Depression Sister   . Hyperlipidemia Sister   . Diabetes Sister   . Hypertension Sister   . Polycystic ovary syndrome Sister   . Bipolar disorder Sister   . Anxiety disorder Sister   . Migraines Sister   . Cancer Maternal Grandmother 40       Breast  . Thyroid disease Maternal Grandmother   . Arthritis Maternal Grandmother   . Hyperlipidemia Maternal Grandmother   . Depression Sister   . Hypertension Sister   . Anxiety disorder Sister   . Migraines Sister   . Alzheimer's disease Other   . Aneurysm Maternal Grandfather   . Hypertension Maternal Grandfather   . Heart disease Maternal Grandfather   . Alzheimer's disease Paternal Grandmother   . Heart attack Paternal  Grandfather   . Hypertension Paternal Grandfather   . COPD Paternal Grandfather   . Heart disease Paternal Grandfather   . Migraines Son   . Migraines Daughter   . Migraines Daughter   . Bladder Cancer Neg Hx   . Kidney cancer Neg Hx     Past Medical History:  Diagnosis Date  . Acute postoperative pain 04/07/2017  . Anxiety   . Bursitis   . Chronic fatigue 12/12/2017  . Chronic fatigue syndrome   . Edema leg 05/02/2015  . Fibromyalgia   . GERD (gastroesophageal reflux disease)   . IBS (irritable bowel syndrome)   . Knee pain, bilateral 12/21/2008   Qualifier: Diagnosis of  By:  Hassell Done FNP, Tori Milks    . Lumbar discitis   . Migraines   . Osteoarthritis   . Right hand pain 04/10/2015   [facility] has done nerve conduction studies and ruled out carpal tunnel.   . Sleep apnea   . Spinal stenosis   . SVT (supraventricular tachycardia) ([place])   . Vertigo   . Vitamin D deficiency 05/01/2016    Patient Active Problem List   Diagnosis Date Noted  . Chronic migraine without aura, with intractable migraine, so stated, with status migrainosus 12/27/2018  . Malar rash 09/04/2018  . Trigger point of neck (Left) 03/19/2018  . Occipital headache 12/25/2017  . Low serum cortisol level ([place]) 12/12/2017  . Chronic fatigue syndrome with fibromyalgia 12/12/2017  . Trigger point of shoulder region (Left) 11/17/2017  . Myofascial pain syndrome (Left) (trapezius muscle) 07/22/2017  . Lumbar L1-2 disc protrusion (Right) 04/07/2017  . Acute postoperative pain 04/07/2017  . Muscle spasticity 04/01/2017  . 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 Lumbar spondylosis 01/06/2017  . Chronic left hip pain 12/24/2016  . Chronic sacroiliac joint pain (Left) 12/24/2016  . Lumbar facet joint syndrome (B) (L>R) 12/24/2016  . Left lumbar radiculitis 12/24/2016  . Hypertriglyceridemia 11/27/2016  . History of vasovagal episode 10/30/2016  . Cervicogenic headache 09/09/2016  . Medication  monitoring encounter 08/29/2016  . Controlled substance agreement signed 08/28/2016  . Plantar fasciitis of left foot 08/28/2016  . Vitamin B12 deficiency 08/28/2016  . Hyperlipidemia 08/28/2016  . Nephrolithiasis 08/12/2016  . Chronic pain syndrome 08/07/2016  . Long term prescription opiate use 08/07/2016  . Opiate use 08/07/2016  . Long term prescription benzodiazepine use 08/07/2016  . Neurogenic pain 08/07/2016  . Chronic low back pain (Primary Source of Pain) (Bilateral) (R>L) (midline) 08/07/2016  . Chronic upper back pain (Secondary source of pain) (Bilateral) (L>R) 08/07/2016  . Chronic abdominal pain (Right lower quadrant) 08/07/2016  . Thoracic radiculitis (Right) (T11 dermatome) 08/07/2016  . Chronic occipital neuralgia [facility] source of pain) (Bilateral) (L>R) 08/07/2016  . Chronic neck pain 08/07/2016  . Chronic cervical radicular pain (Bilateral) (L>R) 08/07/2016  . Chronic shoulder blade pain (Bilateral) (L>R) 08/07/2016  . Chronic upper extremity pain (Bilateral) (R>L) 08/07/2016  . Chronic knee pain (Bilateral) (R>L) 08/07/2016  . Chronic ankle pain (Bilateral) 08/07/2016  . Cervical spondylosis with myelopathy and radiculopathy 08/07/2016  . Panic disorder with agoraphobia 05/29/2016  . Depression, unspecified depression type 05/29/2016  . Atypical lymphocytosis 05/01/2016  . Vitamin D insufficiency 05/01/2016  . Chronic lower extremity cramps (Bilateral) (R>L) 04/29/2016  . Obesity 04/29/2016  . GAD (generalized anxiety disorder) 04/29/2016  . Fatigue 04/29/2016  . Insomnia 07/12/2015  . Migraine without aura and with status migrainosus, not intractable 07/12/2015  . Chronic superficial gastritis 06/02/2015  . Chronic pain of multiple joints 05/15/2015  . Bilateral leg edema 05/02/2015  . Paroxysmal supraventricular tachycardia ([place]) 04/17/2015  . Exertional shortness of breath 04/17/2015  . Bright red rectal bleeding 04/06/2015  . DDD (degenerative disc  disease), lumbosacral 01/24/2014  . DDD (degenerative disc disease), lumbar 01/24/2014  . Cervico-occipital neuralgia 12/29/2013  . Fibromyalgia 12/29/2013  . Migraine headache 12/29/2013  . Menorrhagia 12/10/2012  . Depression, major, recurrent, in remission ([place]) 01/12/2009  . Chest pain 01/12/2009  . Hypertension, benign essential, goal below 140/90 06/23/2008  . History of PSVT (paroxysmal supraventricular tachycardia) 06/17/2008  . Obstructive sleep apnea, adult 06/17/2008  . GERD 06/13/2008    Past Surgical History:  Procedure Laterality Date  . ABLATION  Uterine  . CARDIAC CATHETERIZATION     with ablation  . COLONOSCOPY WITH PROPOFOL N/A 05/17/2015   Procedure: COLONOSCOPY WITH PROPOFOL;  Surgeon: Manya Silvas, MD;  [facility] ENDOSCOPY;  Service: Endoscopy;  Laterality: N/A;  . ESOPHAGOGASTRODUODENOSCOPY N/A 05/17/2015   Procedure: ESOPHAGOGASTRODUODENOSCOPY (EGD);  Surgeon: Manya Silvas, MD;  Location: [facility] ENDOSCOPY;  Service: Endoscopy;  Laterality: N/A;  . KNEE ARTHROSCOPY    . spg     6/18  . Tibial Tubercle Bypass Right 1998  . TUBAL LIGATION  10/01/99    Current Outpatient Medications  Medication Sig Dispense Refill  . acetaminophen (TYLENOL) 500 MG tablet Take 500-1,000 mg by mouth as needed.    Marland Kitchen atorvastatin (LIPITOR) 40 MG tablet Take 1 tablet (40 mg total) by mouth at bedtime. This replaces crestor (rosuvastatin) 90 tablet 1  . baclofen (LIORESAL) 10 MG tablet Take 1 tablet (10 mg total) by mouth 3 (three) times daily. 90 tablet 2  . Cholecalciferol (D3-1000 PO) Take 1,000 mg by mouth daily.    . Cyanocobalamin (VITAMIN B-12) 500 MCG SUBL Place 1,500 mcg under the tongue every other day.  150 tablet   . desvenlafaxine (PRISTIQ) 50 MG 24 hr tablet Take 1 tablet (50 mg total) by mouth at bedtime. (Patient not taking: Reported on 12/24/2018) 30 tablet 1  . Dexlansoprazole (DEXILANT) 30 MG capsule Take 1 capsule (30 mg total) by mouth daily. This  replaces omeprazole 30 capsule 2  . diclofenac sodium (VOLTAREN) 1 % GEL Apply 2 g topically 4 (four) times daily. 1 Tube 2  . HYDROcodone-acetaminophen (NORCO) 7.5-325 MG tablet Take 1 tablet by mouth every 6 (six) hours as needed for severe pain. 30 tablet 0  . magnesium oxide (MAG-OX) 400 MG tablet Take 300 mg by mouth daily.     . Melatonin 10 MG TABS Take 10 mg by mouth at bedtime.    . metoprolol tartrate (LOPRESSOR) 50 MG tablet Take 1 tablet (50 mg total) by mouth 2 (two) times daily. 180 tablet 1  . ondansetron (ZOFRAN) 4 MG tablet Take 1 tablet (4 mg total) by mouth every 8 (eight) hours as needed. 20 tablet 2  . OXcarbazepine (TRILEPTAL) 150 MG tablet Take 1 tablet (150 mg total) by mouth 2 (two) times daily. 60 tablet 2  . oxyCODONE (OXY IR/ROXICODONE) 5 MG immediate release tablet Take 5 mg by mouth every 4 (four) hours as needed for severe pain.    . pregabalin (LYRICA) 150 MG capsule Take 1 capsule (150 mg total) by mouth every 8 (eight) hours. 90 capsule 2  . venlafaxine XR (EFFEXOR-XR) 75 MG 24 hr capsule Take 75 mg by mouth at bedtime.     No current facility-administered medications for this visit.     Allergies as of 12/25/2018 - Review Complete 12/24/2018  Allergen Reaction Noted  . Aspirin Swelling 06/13/2008  . Cymbalta [duloxetine hcl] Other (See Comments) 09/09/2016  . Depakote [divalproex sodium] Shortness Of Breath 12/10/2012  . Duloxetine Other (See Comments) 09/17/2016  . Haloperidol Shortness Of Breath 05/15/2015  . Iodinated diagnostic agents Other (See Comments) 05/15/2015  . Iodine Shortness Of Breath 01/23/2015  . Meperidine Nausea And Vomiting 12/10/2012  . Reglan [metoclopramide] Shortness Of Breath 09/16/2016  . Tramadol hcl Palpitations 09/09/2016  . Trazodone Shortness Of Breath 05/15/2015  . Compazine [prochlorperazine] Other (See Comments) 09/16/2016  . Meloxicam Other (See Comments) 03/17/2014  . Penicillins Rash 06/13/2008  . Tomato Hives  04/29/2016  . Other  03/17/2014  .  Shellfish allergy  03/17/2014  . Shellfish-derived products Other (See Comments) 01/23/2015  . Bacitracin-neomycin-polymyxin Rash 05/15/2015  . Cephalosporins Rash 05/15/2015  . Ibuprofen Other (See Comments) and Rash 01/05/2013  . Latex Itching 03/17/2014  . Neosporin [neomycin-bacitracin zn-polymyx] Rash 12/10/2012  . Nsaids Other (See Comments) 03/17/2014  . Sulfa antibiotics Rash 03/17/2014  . Sulfasalazine Rash 06/13/2008  . Sulfonamide derivatives Rash 06/13/2008    Vitals: There were no vitals taken for this visit. Last Weight:  Wt Readings from Last 1 Encounters:  11/25/18 213 lb 6.4 oz (96.8 kg)   Last Height:   Ht Readings from Last 1 Encounters:  11/25/18 5\' 3"  (1.6 m)     Physical exam: Exam:  Physical exam: Exam: Gen: NAD, conversant      CV: attempted, Could not perform over Web Video  Eyes: Conjunctivae clear without exudates or hemorrhage  Neuro: Detailed Neurologic Exam  Speech:    Speech is normal; fluent and spontaneous with normal comprehension.  Cognition:    The patient is oriented to person, place, and time;     Cranial Nerves:    Cannot perform fundoscopic exam. Visual fields are full to finger confrontation. Extraocular movements are intact.  The face is symmetric with normal sensation. The palate elevates in the midline. Hearing intact. Voice is normal. Shoulder shrug is normal. The tongue has normal motion without fasciculations.   Coordination:    Normal finger to nose  Motor Observation:   no involuntary movements noted. Tone:    Appears normal  Posture:    Posture is normal. normal erect    Strength:    Strength is anti-gravity and symmetric in the upper and lower limbs.      Reflex Exam:  DTR's:    Attempted, Could not perform over Web Video   Toes: Attempted Could not perform over Web Video  Clonus:   Attempted, Could not perform over Web Video     Assessment/Plan:  48 y.o. female  here as requested by Hubbard Hartshorn, FNP for migraines and visual disturbances. PMHx migraines and visual disturbances, anxiety, chronic fatigue, Fibromyalgia, IBS, sleep apnea, vertigo, obesity, chronic pain uses a walker  Migraine meds/procedures tried: baclofen, metoprolol, zofran, trileptal, lyrica, sumatriptan, elavil, ajovy, Topamax, amitriptyline, nortriptyline, SPG, RFA on the occipital nerve, trigger point.  Botox - She has failed multiple classes including CGRP. At this point she needs botox for migraines. (May try to layer Emgality over it at a later time if needed but she failed Ajovy. Has constipation will not try Aimovig).  Cervical myofascial pain syndrome: dry Needling and PT   Follow Up Instructions:    I discussed the assessment and treatment plan with the patient. The patient was provided an opportunity to ask questions and all were answered. The patient agreed with the plan and demonstrated an understanding of the instructions.   The patient was advised to call back or seek an in-person evaluation if the symptoms worsen or if the condition fails to improve as anticipated.  I provided 60 minutes of non-face-to-face time during this encounter discussing  1. Chronic migraine without aura, with intractable migraine, so stated, with status migrainosus   2. Cervicalgia   3. Cervical myofascial pain syndrome      Orders Placed This Encounter  Procedures  . Ambulatory referral to Physical Therapy   Discussed: To prevent or relieve headaches, try the following: Cool Compress. Lie down and place a cool compress on your head.  Avoid headache triggers. If certain foods  or odors seem to have triggered your migraines in the past, avoid them. A headache diary might help you identify triggers.  Include physical activity in your daily routine. Try a daily walk or other moderate aerobic exercise.  Manage stress. Find healthy ways to cope with the stressors, such as delegating tasks on  your to-do list.  Practice relaxation techniques. Try deep breathing, yoga, massage and visualization.  Eat regularly. Eating regularly scheduled meals and maintaining a healthy diet might help prevent headaches. Also, drink plenty of fluids.  Follow a regular sleep schedule. Sleep deprivation might contribute to headaches Consider biofeedback. With this mind-body technique, you learn to control certain bodily functions - such as muscle tension, heart rate and blood pressure - to prevent headaches or reduce headache pain.    Proceed to emergency room if you experience new or worsening symptoms or symptoms do not resolve, if you have new neurologic symptoms or if headache is severe, or for any concerning symptom.   Provided education and documentation from American headache Society toolbox including articles on: chronic migraine medication overuse headache, chronic migraines, prevention of migraines, behavioral and other nonpharmacologic treatments for headache.   Cc: Hubbard Hartshorn, FNP,    Sarina Ill, MD  [facility] [address] [place], [place] [address]  Phone [phone] Fax [phone]

## 2018-12-27 ENCOUNTER — Telehealth: Payer: Self-pay | Admitting: Neurology

## 2018-12-27 DIAGNOSIS — G43711 Chronic migraine without aura, intractable, with status migrainosus: Secondary | ICD-10-CM | POA: Insufficient documentation

## 2018-12-27 NOTE — Telephone Encounter (Signed)
Dana Bradley - please start the approval process with patient on Botox and call her about the process. She has [facility] financial assistance and it appears Medicaid. If something happens and it doe snot get approved or it is very expensive  then come talk to me first and I will ask botox for samples for one year for her.   [name] - fill out a botox for migraine request form g43.711 please. I also saved some Emality for her in the fridge just in case something happens with the botox and we have to try something else (However she failed Ajovy).   Thanks

## 2018-12-28 ENCOUNTER — Telehealth: Payer: Self-pay | Admitting: Pharmacist

## 2018-12-28 NOTE — Telephone Encounter (Signed)
Do I have to print it out and give it to you or can I send electronically Pls let me know

## 2018-12-28 NOTE — Telephone Encounter (Signed)
Noted. Botox request form for 200 units with Dx: O29.476 is ready for MD signature.

## 2018-12-28 NOTE — Telephone Encounter (Signed)
12/28/2018 1:46:57 PM - Dexilant  12/28/2018 Faxed [employer] application for Dexilant 30mg  Take one tablet by mouth daily for enrollment.Delos Haring

## 2018-12-28 NOTE — Telephone Encounter (Signed)
pt called states she needs her medication pristiq sent to medication management.

## 2018-12-28 NOTE — Telephone Encounter (Signed)
I called the patient to get her placed on the schedule and go over the process financially. She did not answer so I left her a VM asking her to call me back.

## 2018-12-29 ENCOUNTER — Ambulatory Visit: Payer: Medicaid Other | Admitting: Licensed Clinical Social Worker

## 2018-12-29 ENCOUNTER — Other Ambulatory Visit: Payer: Self-pay

## 2018-12-29 DIAGNOSIS — F331 Major depressive disorder, recurrent, moderate: Secondary | ICD-10-CM

## 2018-12-29 DIAGNOSIS — F411 Generalized anxiety disorder: Secondary | ICD-10-CM

## 2019-01-04 NOTE — Telephone Encounter (Signed)
Printed rx per the patient

## 2019-01-05 MED ORDER — DESVENLAFAXINE SUCCINATE ER 50 MG PO TB24: 50.0000 mg | ORAL_TABLET | Freq: Every day | ORAL | 1 refills | Status: DC

## 2019-01-05 NOTE — Telephone Encounter (Signed)
Printed out Pristiq and will give to CMA Janett Billow to fax it to med management.

## 2019-01-06 NOTE — Telephone Encounter (Signed)
Correction 01-05-19

## 2019-01-06 NOTE — Telephone Encounter (Signed)
faxed and confirm the rx fro pristiq 50mg   id# [id number] order # [id number] written on  11-06-18

## 2019-02-01 ENCOUNTER — Ambulatory Visit: Payer: BLUE CROSS/BLUE SHIELD | Admitting: Neurology

## 2019-02-08 NOTE — Progress Notes (Signed)
   THERAPIST PROGRESS NOTE  Session Time: 21mn  Participation Level: Active  Behavioral Response: CasualAlertEuthymic  Type of Therapy: Individual Therapy  Treatment Goals addressed: Anxiety  Interventions: CBT and Solution Focused  Summary: Dana Bradley a 48y.o. female who presents with continued symptoms of her diagnosis.  Therapist met with Patient in an initial therapy session to assess current mood and to build rapport. Therapist engaged Patient in discussion about her life and what is going well for her. Therapist provided support for Patient as she shared details about her life, her current stressors, mood, coping skills, her past, and her children. Therapist prompted Patient to discuss her support system and ways that she manages her daily stress, anger, and frustrations.    LCSW discussed what psychotherapy is and is not and the importance of the therapeutic relationship to include open and honest communication between client and therapist and building trust.  Reviewed advantages and disadvantages of the therapeutic process and limitations to the therapeutic relationship including LCSW's role in maintaining the safety of the client, others and those in client's care.    Suicidal/Homicidal: No  Plan: Return again in 2 weeks.  Diagnosis: Axis I: Generalized Anxiety Disorder    Axis II: No diagnosis    Dana South LCSW 12/15/2018

## 2019-02-12 NOTE — Progress Notes (Signed)
   THERAPIST PROGRESS NOTE  Session Time: 25min  Participation Level: Active  Behavioral Response: CasualAlertEuthymic  Type of Therapy: Individual Therapy  Treatment Goals addressed: Anxiety  Interventions: CBT and Solution Focused  Summary: Dana Bradley is a 48 y.o. female who presents with continued symptoms of her diagnosis.  Discussed symptoms of diagnosis and reviewed with Patient office policy since CoVid 19.  Actively listened as patient vented her frustration. Educated Patient on the importance of journaling and becoming self aware.    Suicidal/Homicidal: No  Plan: Return again in 2 weeks.  Diagnosis: Axis I: Generalized Anxiety Disorder    Axis II: No diagnosis    Lubertha South, LCSW 12/29/2018

## 2019-02-16 ENCOUNTER — Other Ambulatory Visit: Payer: Self-pay

## 2019-02-16 ENCOUNTER — Telehealth: Payer: Self-pay

## 2019-02-16 ENCOUNTER — Ambulatory Visit: Payer: Medicaid Other | Attending: Nurse Practitioner | Admitting: Nurse Practitioner

## 2019-02-16 DIAGNOSIS — M19011 Primary osteoarthritis, right shoulder: Secondary | ICD-10-CM

## 2019-02-16 DIAGNOSIS — G4486 Cervicogenic headache: Secondary | ICD-10-CM

## 2019-02-16 DIAGNOSIS — M4712 Other spondylosis with myelopathy, cervical region: Secondary | ICD-10-CM

## 2019-02-16 DIAGNOSIS — M19012 Primary osteoarthritis, left shoulder: Secondary | ICD-10-CM

## 2019-02-16 DIAGNOSIS — M4722 Other spondylosis with radiculopathy, cervical region: Secondary | ICD-10-CM

## 2019-02-16 DIAGNOSIS — R51 Headache: Secondary | ICD-10-CM

## 2019-02-16 DIAGNOSIS — M797 Fibromyalgia: Secondary | ICD-10-CM

## 2019-02-16 DIAGNOSIS — M792 Neuralgia and neuritis, unspecified: Secondary | ICD-10-CM

## 2019-02-16 DIAGNOSIS — M62838 Other muscle spasm: Secondary | ICD-10-CM

## 2019-02-16 DIAGNOSIS — M5481 Occipital neuralgia: Secondary | ICD-10-CM

## 2019-02-16 DIAGNOSIS — M47816 Spondylosis without myelopathy or radiculopathy, lumbar region: Secondary | ICD-10-CM

## 2019-02-16 MED ORDER — DICLOFENAC SODIUM 1 % TD GEL: 2.0000 g | Freq: Four times a day (QID) | TRANSDERMAL | 2 refills | Status: DC

## 2019-02-16 MED ORDER — BACLOFEN 10 MG PO TABS: 10.0000 mg | ORAL_TABLET | Freq: Three times a day (TID) | ORAL | 2 refills | Status: DC

## 2019-02-16 MED ORDER — PREGABALIN 150 MG PO CAPS: 150.0000 mg | ORAL_CAPSULE | Freq: Three times a day (TID) | ORAL | 2 refills | Status: DC

## 2019-02-16 NOTE — Progress Notes (Signed)
Pain Management Encounter Note - Virtual Visit via Telephone Telehealth (real-time audio visits between healthcare provider and patient).  Patient's Phone No. & Preferred Pharmacy:  [phone] (home); [phone] (mobile); (Preferred) [phone]  [facility] [address], [address] [place] [address] Phone: [phone] Fax: [phone]   Pre-screening note:  Our staff contacted Dana Bradley and offered her an "in person", "face-to-face" appointment versus a telephone encounter. She indicated preferring the telephone encounter, at this time.  Reason for Virtual Visit: COVID-19*  Social distancing based on CDC and AMA recommendations.   I contacted Dana Bradley on 02/16/2019 at 8:33 AM by telephone and clearly identified myself as Dionisio David, NP. I verified that I was speaking with the correct person using two identifiers (Name and date of birth: 01-Nov-1970).  Advanced Informed Consent I sought verbal advanced consent from Dana Bradley for telemedicine interactions and virtual visit. I informed Dana Bradley of the security and privacy concerns, risks, and limitations associated with performing an evaluation and management service by telephone. I also informed Dana Bradley of the availability of "in person" appointments and I informed her of the possibility of a patient responsible charge related to this service. Dana Bradley expressed understanding and agreed to proceed.   Historic Elements   Dana Bradley is a 48 y.o. year old, female patient evaluated today after her last encounter by our practice on 11/18/2018. Dana Bradley  has a past medical history of Acute postoperative pain (04/07/2017), Anxiety, Bursitis, Chronic fatigue (12/12/2017), Chronic fatigue syndrome, Edema leg (05/02/2015), Fibromyalgia, GERD (gastroesophageal reflux disease), IBS (irritable bowel syndrome), Knee pain, bilateral (12/21/2008), Lumbar discitis, Migraines, Osteoarthritis,  Right hand pain (04/10/2015), Sleep apnea, Spinal stenosis, SVT (supraventricular tachycardia) ([facility]), Vertigo, and Vitamin D deficiency (05/01/2016). She also  has a past surgical history that includes Ablation; Tubal ligation (10/01/99); Cardiac catheterization; Knee arthroscopy; Colonoscopy with propofol (N/A, 05/17/2015); Esophagogastroduodenoscopy (N/A, 05/17/2015); spg; and Tibial Tubercle Bypass (Right, 1998). Dana Bradley has a current medication list which includes the following prescription(s): acetaminophen, atorvastatin, baclofen, cholecalciferol, vitamin b-12, desvenlafaxine, dexlansoprazole, diclofenac sodium, hydrocodone-acetaminophen, magnesium oxide, melatonin, metoprolol tartrate, ondansetron, oxcarbazepine, oxycodone, pregabalin, and venlafaxine xr. She  reports that she quit smoking about 26 years ago. Her smoking use included cigarettes. She has a 12.00 pack-year smoking history. She has never used smokeless tobacco. She reports that she does not drink alcohol or use drugs. Dana Bradley is allergic to aspirin; cymbalta [duloxetine hcl]; depakote [divalproex sodium]; duloxetine; haloperidol; iodinated diagnostic agents; iodine; meperidine; reglan [metoclopramide]; tramadol hcl; trazodone; compazine [prochlorperazine]; meloxicam; penicillins; tomato; other; shellfish allergy; shellfish-derived products; bacitracin-neomycin-polymyxin; cephalosporins; ibuprofen; latex; neosporin [neomycin-bacitracin zn-polymyx]; nsaids; sulfa antibiotics; sulfasalazine; and sulfonamide derivatives.   HPI  I last saw her on 11/18/2018. She is being evaluated for medication management. She has 5/10 in her lower back. She is having problems getting around. She also has a migraine. She is having right leg pain into her foot. She admits it is mostly in the thigh. She has tingling and weakness. She is not able to support her upper body weight. She denies any falls. She is using the cane or walker to assist her. She is not sure what  caused her pain. She is aching all over from he fibromyalgia. She only uses the hydrocodone 1/2 tab weekly over the last few weeks.   Pharmacotherapy Assessment  Analgesic:Hydrocodone/APAP 7.5/325 one daily MME/day:7.5mg /day.   Monitoring: Pharmacotherapy: No side-effects or adverse reactions reported. [place] PMP: PDMP reviewed during  this encounter.       Compliance: No problems identified. Plan: Refer to "POC".  Review of recent tests  NM Myocar Multi W/Spect W/Wall Motion / EF  Blood pressure demonstrated a normal response to exercise.  There was no ST segment deviation noted during stress.  No T wave inversion was noted during stress.  The study is normal.  This is a low risk study.  The left ventricular ejection fraction is normal (55-65%).     Office Visit on 07/30/2018  Component Date Value Ref Range Status  . CRP 07/30/2018 6.0  <8.0 mg/L Final  . Sed Rate 07/30/2018 17  0 - 20 mm/h Final  . WBC 07/30/2018 8.3  3.8 - 10.8 Thousand/uL Final  . RBC 07/30/2018 4.30  3.80 - 5.10 Million/uL Final  . Hemoglobin 07/30/2018 13.7  11.7 - 15.5 g/dL Final  . HCT 07/30/2018 40.1  35.0 - 45.0 % Final  . MCV 07/30/2018 93.3  80.0 - 100.0 fL Final  . MCH 07/30/2018 31.9  27.0 - 33.0 pg Final  . MCHC 07/30/2018 34.2  32.0 - 36.0 g/dL Final  . RDW 07/30/2018 12.1  11.0 - 15.0 % Final  . Platelets 07/30/2018 243  140 - 400 Thousand/uL Final  . MPV 07/30/2018 10.2  7.5 - 12.5 fL Final  . Neutro Abs 07/30/2018 5,727  1,500 - 7,800 cells/uL Final  . Lymphs Abs 07/30/2018 1,785  850 - 3,900 cells/uL Final  . WBC mixed population 07/30/2018 623  200 - 950 cells/uL Final  . Eosinophils Absolute 07/30/2018 100  15 - 500 cells/uL Final  . Basophils Absolute 07/30/2018 66  0 - 200 cells/uL Final  . Neutrophils Relative % 07/30/2018 69  % Final  . Total Lymphocyte 07/30/2018 21.5  % Final  . Monocytes Relative 07/30/2018 7.5  % Final  . Eosinophils Relative 07/30/2018 1.2  % Final  .  Basophils Relative 07/30/2018 0.8  % Final  . Anti Nuclear Antibody(ANA) 07/30/2018 NEGATIVE  NEGATIVE Final   Comment: ANA IFA is a first line screen for detecting the presence of up to approximately 150 autoantibodies in various autoimmune diseases. A negative ANA IFA result suggests an ANA-associated autoimmune disease is not present at this time, but is not definitive. If there is high clinical suspicion for Sjogren's syndrome, testing for anti-SS-A/Ro antibody should be considered. Anti-Jo-1 antibody should be considered for clinically suspected inflammatory myopathies. . AC-0: Negative . International Consensus on ANA Patterns (https://www.hernandez-brewer.com/) . For additional information, please refer to http://education.[facility].com/faq/FAQ177 (This link is being provided for informational/ educational purposes only.) .    Assessment  The primary encounter diagnosis was Cervicogenic headache. Diagnoses of Muscle spasticity, Osteoarthritis of shoulder (B), Fibromyalgia, Cervico-occipital neuralgia, Neurogenic pain, Cervical spondylosis with myelopathy and radiculopathy, and Lumbar spondylosis were also pertinent to this visit.  Plan of Care  I am having Dana Bradley maintain her Vitamin B-12, HYDROcodone-acetaminophen, magnesium oxide, oxyCODONE, OXcarbazepine, ondansetron, Dexlansoprazole, Cholecalciferol (D3-1000 PO), atorvastatin, metoprolol tartrate, acetaminophen, venlafaxine XR, Melatonin, desvenlafaxine, baclofen, diclofenac sodium, and pregabalin.  Pharmacotherapy (Medications Ordered): Meds ordered this encounter  Medications  . baclofen (LIORESAL) 10 MG tablet    Sig: Take 1 tablet (10 mg total) by mouth 3 (three) times daily.    Dispense:  90 tablet    Refill:  2    Do not place this medication, or any other prescription from our practice, on "Automatic Refill". Patient may have prescription filled one day early if pharmacy is closed on scheduled  refill date.  Order Specific Question:   Supervising Provider    Answer:   Milinda Pointer [phone]  . diclofenac sodium (VOLTAREN) 1 % GEL    Sig: Apply 2 g topically 4 (four) times daily.    Dispense:  3 Tube    Refill:  2    Do not place medication on "Automatic Refill". Fill one day early if pharmacy is closed on scheduled refill date.    Order Specific Question:   Supervising Provider    Answer:   Milinda Pointer [phone]  . pregabalin (LYRICA) 150 MG capsule    Sig: Take 1 capsule (150 mg total) by mouth every 8 (eight) hours.    Dispense:  90 capsule    Refill:  2    Do not add this medication to the electronic "Automatic Refill" notification system. Patient may have prescription filled one day early if pharmacy is closed on scheduled refill date.    Order Specific Question:   Supervising Provider    Answer:   Milinda Pointer [phone]   Orders:  No orders of the defined types were placed in this encounter.  Follow-up plan:   Return for MedMgmt.   I discussed the assessment and treatment plan with the patient. The patient was provided an opportunity to ask questions and all were answered. The patient agreed with the plan and demonstrated an understanding of the instructions.  Patient advised to call back or seek an in-person evaluation if the symptoms or condition worsens.  Total duration of non-face-to-face encounter: 18 minutes.  Note by: Dionisio David, NP Date: 02/16/2019; Time: 8:50 AM  Disclaimer:  * Given the special circumstances of the COVID-19 pandemic, the federal government has announced that the Office for Civil Rights (OCR) will exercise its enforcement discretion and will not impose penalties on physicians using telehealth in the event of noncompliance with regulatory requirements under the [place] (HIPAA) in connection with the good faith provision of telehealth during the AQTMA-26 national public health  emergency. ([place])

## 2019-02-16 NOTE — Telephone Encounter (Signed)
Pharmacy doesn't have the 1 % voltaren gel only 3%. They want to know if Crystal will change the dosage.

## 2019-02-16 NOTE — Patient Instructions (Signed)
____________________________________________________________________________________________  Medication Rules  Purpose: To inform patients, and their family members, of our rules and regulations.  Applies to: All patients receiving prescriptions (written or electronic).  Pharmacy of record: Pharmacy where electronic prescriptions will be sent. If written prescriptions are taken to a different pharmacy, please inform the nursing staff. The pharmacy listed in the electronic medical record should be the one where you would like electronic prescriptions to be sent.  Electronic prescriptions: In compliance with the [place] Strengthen Opioid Misuse Prevention (STOP) Act of 2017 (Session Law 2017-74/H243), effective September 30, 2018, all controlled substances must be electronically prescribed. Calling prescriptions to the pharmacy will cease to exist.  Prescription refills: Only during scheduled appointments. Applies to all prescriptions.  NOTE: The following applies primarily to controlled substances (Opioid* Pain Medications).   Patient's responsibilities: 1. Pain Pills: Bring all pain pills to every appointment (except for procedure appointments). 2. Pill Bottles: Bring pills in original pharmacy bottle. Always bring the newest bottle. Bring bottle, even if empty. 3. Medication refills: You are responsible for knowing and keeping track of what medications you take and those you need refilled. The day before your appointment: write a list of all prescriptions that need to be refilled. The day of the appointment: give the list to the admitting nurse. Prescriptions will be written only during appointments. No prescriptions will be written on procedure days. If you forget a medication: it will not be "Called in", "Faxed", or "electronically sent". You will need to get another appointment to get these prescribed. No early refills. Do not call asking to have your prescription filled  early. 4. Prescription Accuracy: You are responsible for carefully inspecting your prescriptions before leaving our office. Have the discharge nurse carefully go over each prescription with you, before taking them home. Make sure that your name is accurately spelled, that your address is correct. Check the name and dose of your medication to make sure it is accurate. Check the number of pills, and the written instructions to make sure they are clear and accurate. Make sure that you are given enough medication to last until your next medication refill appointment. 5. Taking Medication: Take medication as prescribed. When it comes to controlled substances, taking less pills or less frequently than prescribed is permitted and encouraged. Never take more pills than instructed. Never take medication more frequently than prescribed.  6. Inform other Doctors: Always inform, all of your healthcare providers, of all the medications you take. 7. Pain Medication from other Providers: You are not allowed to accept any additional pain medication from any other Doctor or Healthcare provider. There are two exceptions to this rule. (see below) In the event that you require additional pain medication, you are responsible for notifying us, as stated below. 8. Medication Agreement: You are responsible for carefully reading and following our Medication Agreement. This must be signed before receiving any prescriptions from our practice. Safely store a copy of your signed Agreement. Violations to the Agreement will result in no further prescriptions. (Additional copies of our Medication Agreement are available upon request.) 9. Laws, Rules, & Regulations: All patients are expected to follow all Federal and State Laws, Statutes, Rules, & Regulations. Ignorance of the Laws does not constitute a valid excuse. The use of any illegal substances is prohibited. 10. Adopted CDC guidelines & recommendations: Target dosing levels will be  at or below 60 MME/day. Use of benzodiazepines** is not recommended.  Exceptions: There are only two exceptions to the rule of not   receiving pain medications from other Healthcare Providers. 1. Exception #1 (Emergencies): In the event of an emergency (i.e.: accident requiring emergency care), you are allowed to receive additional pain medication. However, you are responsible for: As soon as you are able, call our office [phone], at any time of the day or night, and leave a message stating your name, the date and nature of the emergency, and the name and dose of the medication prescribed. In the event that your call is answered by a member of our staff, make sure to document and save the date, time, and the name of the person that took your information.  2. Exception #2 (Planned Surgery): In the event that you are scheduled by another doctor or dentist to have any type of surgery or procedure, you are allowed (for a period no longer than 30 days), to receive additional pain medication, for the acute post-op pain. However, in this case, you are responsible for picking up a copy of our "Post-op Pain Management for Surgeons" handout, and giving it to your surgeon or dentist. This document is available at our office, and does not require an appointment to obtain it. Simply go to our office during business hours (Monday-Thursday from 8:00 AM to 4:00 PM) (Friday 8:00 AM to 12:00 Noon) or if you have a scheduled appointment with us, prior to your surgery, and ask for it by name. In addition, you will need to provide us with your name, name of your surgeon, type of surgery, and date of procedure or surgery.  *Opioid medications include: morphine, codeine, oxycodone, oxymorphone, hydrocodone, hydromorphone, meperidine, tramadol, tapentadol, buprenorphine, fentanyl, methadone. **Benzodiazepine medications include: diazepam (Valium), alprazolam (Xanax), clonazepam (Klonopine), lorazepam (Ativan), clorazepate  (Tranxene), chlordiazepoxide (Librium), estazolam (Prosom), oxazepam (Serax), temazepam (Restoril), triazolam (Halcion) (Last updated: 11/27/2017) ____________________________________________________________________________________________    

## 2019-02-17 ENCOUNTER — Other Ambulatory Visit: Payer: Self-pay | Admitting: Nurse Practitioner

## 2019-02-17 MED ORDER — DICLOFENAC SODIUM 3 % TD GEL: 1.0000 "application " | Freq: Four times a day (QID) | TRANSDERMAL | 2 refills | Status: AC | PRN

## 2019-02-17 NOTE — Telephone Encounter (Signed)
sent 

## 2019-03-03 ENCOUNTER — Telehealth: Payer: Self-pay

## 2019-03-03 ENCOUNTER — Other Ambulatory Visit: Payer: Self-pay | Admitting: Pain Medicine

## 2019-03-03 DIAGNOSIS — M797 Fibromyalgia: Secondary | ICD-10-CM

## 2019-03-03 MED ORDER — PREGABALIN 150 MG PO CAPS: 150.0000 mg | ORAL_CAPSULE | Freq: Three times a day (TID) | ORAL | 0 refills | Status: DC

## 2019-03-03 NOTE — Telephone Encounter (Signed)
Taked with [facility] and told her that presprictions were done.

## 2019-03-03 NOTE — Telephone Encounter (Addendum)
She dropped off a from for [employer] for [place] to fill out and fax so she can get her lyrica. Do you know if this has been done, she is out. Also she is having severe migraines and wants to get a procedure so if you can put in what procedure I will schedule it.

## 2019-03-03 NOTE — Progress Notes (Signed)
Today I had to reprint this patient's prescription because she indicated that she is getting financial assistance and she needs the printed prescription so that it can be sent to [facility].

## 2019-03-05 ENCOUNTER — Ambulatory Visit: Payer: Medicaid Other | Admitting: Physical Therapy

## 2019-03-08 ENCOUNTER — Encounter: Payer: Self-pay | Admitting: Physical Therapy

## 2019-03-08 ENCOUNTER — Other Ambulatory Visit: Payer: Self-pay

## 2019-03-08 ENCOUNTER — Ambulatory Visit: Payer: Self-pay | Attending: Pain Medicine | Admitting: Physical Therapy

## 2019-03-08 DIAGNOSIS — Z9181 History of falling: Secondary | ICD-10-CM | POA: Insufficient documentation

## 2019-03-08 DIAGNOSIS — R269 Unspecified abnormalities of gait and mobility: Secondary | ICD-10-CM | POA: Insufficient documentation

## 2019-03-08 DIAGNOSIS — M47816 Spondylosis without myelopathy or radiculopathy, lumbar region: Secondary | ICD-10-CM | POA: Insufficient documentation

## 2019-03-08 DIAGNOSIS — M6281 Muscle weakness (generalized): Secondary | ICD-10-CM | POA: Insufficient documentation

## 2019-03-15 ENCOUNTER — Telehealth: Payer: Self-pay

## 2019-03-15 NOTE — Telephone Encounter (Signed)
She called and said [employer] didn't get the forms you faxed for her to get her meds. She wants to know if they can be faxed again. She also wants a nurse to call her.

## 2019-03-15 NOTE — Telephone Encounter (Signed)
Informed patient that we had sent information, but we are resending it now.

## 2019-03-15 NOTE — Therapy (Signed)
[facility] [address]. [place], [place], [address] Phone: [phone]   Fax:  [phone]  Physical Therapy Evaluation  Patient Details  Name: Dana Bradley MRN: [id number] Date of Birth: 31-Oct-1970 Referring Provider (PT): Dr. Dossie Arbour   Encounter Date: 03/08/2019  PT End of Session - 03/15/19 1703    Visit Number  1    Number of Visits  1    Date for PT Re-Evaluation  03/09/19    PT Start Time  3704    PT Stop Time  1636    PT Time Calculation (min)  119 min    Activity Tolerance  Patient limited by pain    Behavior During Therapy  [facility] for tasks assessed/performed       Past Medical History:  Diagnosis Date  . Acute postoperative pain 04/07/2017  . Anxiety   . Bursitis   . Chronic fatigue 12/12/2017  . Chronic fatigue syndrome   . Edema leg 05/02/2015  . Fibromyalgia   . GERD (gastroesophageal reflux disease)   . IBS (irritable bowel syndrome)   . Knee pain, bilateral 12/21/2008   Qualifier: Diagnosis of  By: Hassell Done FNP, Tori Milks    . Lumbar discitis   . Migraines   . Osteoarthritis   . Right hand pain 04/10/2015   [facility] has done nerve conduction studies and ruled out carpal tunnel.   . Sleep apnea   . Spinal stenosis   . SVT (supraventricular tachycardia) ([place])   . Vertigo   . Vitamin D deficiency 05/01/2016    Past Surgical History:  Procedure Laterality Date  . ABLATION     Uterine  . CARDIAC CATHETERIZATION     with ablation  . COLONOSCOPY WITH PROPOFOL N/A 05/17/2015   Procedure: COLONOSCOPY WITH PROPOFOL;  Surgeon: Manya Silvas, MD;  Location: [facility] ENDOSCOPY;  Service: Endoscopy;  Laterality: N/A;  . ESOPHAGOGASTRODUODENOSCOPY N/A 05/17/2015   Procedure: ESOPHAGOGASTRODUODENOSCOPY (EGD);  Surgeon: Manya Silvas, MD;  Location: [facility] ENDOSCOPY;  Service: Endoscopy;  Laterality: N/A;  . KNEE ARTHROSCOPY    . spg     6/18  . Tibial Tubercle Bypass Right 1998  . TUBAL LIGATION  10/01/99     There were no vitals filed for this visit.    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 Pt. didn't work for 10 years prior to 2018. Pt. using SPC currently but has RW when her pain worsens. Pt. reports no surgeries but numerous injections.   See FCE report               Plan - 03/15/19 1704    Clinical Impression Statement  Overall Level of Work: Falls within the Sedentary range.  Exerting up to 10 pounds of force occasionally (Occasionally: activity or condition exist up to 1/3 or the time) and/or a negligible amount of force frequently (Frequently: activity or condition exist from 1/3 to 2/3 or the time) to lift, carry, push, pull, or otherwise move objects, including the human body.  Sedentary work involves sitting most of the time, but may involve walking or standing for brief periods of time.  Jobs are sedentary if walking and standing are required only occasionally and all other sedentary criteria are met.    Please see the Task Performance Table for specific abilities.  Tolerance for the 8-Hour Day: Due to the client's limited ability to sit, she would have to alternate between sitting and other tasks as listed  in the task performance table to tolerate the Sedentary level of work for the 8-hour day/40-hour week.  Due to the limited sitting tolerance, however, the client will need to alternate among other tasks listed in the task performance table to maximize work tolerance to the 8-hour day.    Stability/Clinical Decision Making  Evolving/Moderate complexity    Clinical Decision Making  Moderate    Rehab Potential  Poor    PT Frequency  1x / week    PT Treatment/Interventions  ADLs/Self Care Home Management;Gait training;Stair training;Functional mobility training;Therapeutic activities;Therapeutic exercise;Balance training    PT Next Visit Plan  FCE only       Patient will benefit from skilled therapeutic intervention in order to improve the  following deficits and impairments:  Abnormal gait, Decreased coordination, Decreased range of motion, Difficulty walking, Decreased endurance, Decreased activity tolerance, Pain, Improper body mechanics, Impaired flexibility, Decreased balance, Decreased mobility, Decreased strength  Visit Diagnosis: 1. Spondylosis of lumbar region without myelopathy or radiculopathy   2. Risk for falls   3. Muscle weakness (generalized)   4. Gait difficulty        Problem List Patient Active Problem List   Diagnosis Date Noted  . Chronic migraine without aura, with intractable migraine, so stated, with status migrainosus 12/27/2018  . Malar rash 09/04/2018  . Trigger point of neck (Left) 03/19/2018  . Occipital headache 12/25/2017  . Low serum cortisol level ([place]) 12/12/2017  . Chronic fatigue syndrome with fibromyalgia 12/12/2017  . Trigger point of shoulder region (Left) 11/17/2017  . Myofascial pain syndrome (Left) (trapezius muscle) 07/22/2017  . Lumbar L1-2 disc protrusion (Right) 04/07/2017  . Acute postoperative pain 04/07/2017  . Muscle spasticity 04/01/2017  . 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 Lumbar spondylosis 01/06/2017  . Chronic left hip pain 12/24/2016  . Chronic sacroiliac joint pain (Left) 12/24/2016  . Lumbar facet joint syndrome (B) (L>R) 12/24/2016  . Left lumbar radiculitis 12/24/2016  . Hypertriglyceridemia 11/27/2016  . History of vasovagal episode 10/30/2016  . Cervicogenic headache 09/09/2016  . Medication monitoring encounter 08/29/2016  . Controlled substance agreement signed 08/28/2016  . Plantar fasciitis of left foot 08/28/2016  . Vitamin B12 deficiency 08/28/2016  . Hyperlipidemia 08/28/2016  . Nephrolithiasis 08/12/2016  . Chronic pain syndrome 08/07/2016  . Long term prescription opiate use 08/07/2016  . Opiate use 08/07/2016  . Long term prescription benzodiazepine use 08/07/2016  . Neurogenic pain 08/07/2016  . Chronic low back pain  (Primary Source of Pain) (Bilateral) (R>L) (midline) 08/07/2016  . Chronic upper back pain (Secondary source of pain) (Bilateral) (L>R) 08/07/2016  . Chronic abdominal pain (Right lower quadrant) 08/07/2016  . Thoracic radiculitis (Right) (T11 dermatome) 08/07/2016  . Chronic occipital neuralgia [facility] source of pain) (Bilateral) (L>R) 08/07/2016  . Chronic neck pain 08/07/2016  . Chronic cervical radicular pain (Bilateral) (L>R) 08/07/2016  . Chronic shoulder blade pain (Bilateral) (L>R) 08/07/2016  . Chronic upper extremity pain (Bilateral) (R>L) 08/07/2016  . Chronic knee pain (Bilateral) (R>L) 08/07/2016  . Chronic ankle pain (Bilateral) 08/07/2016  . Cervical spondylosis with myelopathy and radiculopathy 08/07/2016  . Panic disorder with agoraphobia 05/29/2016  . Depression, unspecified depression type 05/29/2016  . Atypical lymphocytosis 05/01/2016  . Vitamin D insufficiency 05/01/2016  . Chronic lower extremity cramps (Bilateral) (R>L) 04/29/2016  . Obesity 04/29/2016  . GAD (generalized anxiety disorder) 04/29/2016  . Fatigue 04/29/2016  . Insomnia 07/12/2015  . Migraine without aura and with status migrainosus, not intractable 07/12/2015  . Chronic superficial  gastritis 06/02/2015  . Chronic pain of multiple joints 05/15/2015  . Bilateral leg edema 05/02/2015  . Paroxysmal supraventricular tachycardia (HCC) 04/17/2015  . Exertional shortness of breath 04/17/2015  . Bright red rectal bleeding 04/06/2015  . DDD (degenerative disc disease), lumbosacral 01/24/2014  . DDD (degenerative disc disease), lumbar 01/24/2014  . Cervico-occipital neuralgia 12/29/2013  . Fibromyalgia 12/29/2013  . Migraine headache 12/29/2013  . Menorrhagia 12/10/2012  . Depression, major, recurrent, in remission (HCC) 01/12/2009  . Chest pain 01/12/2009  . Hypertension, benign essential, goal below 140/90 06/23/2008  . History of PSVT (paroxysmal supraventricular tachycardia) 06/17/2008  .  Obstructive sleep apnea, adult 06/17/2008  . GERD 06/13/2008    C , PT, DPT # [id number] 03/15/2019, 5:09 PM  [facility] [address] [place], [place], [address] Phone: [phone]   Fax:  [phone]  Name: Dana Bradley MRN: [id number] Date of Birth: 05/24/1971  

## 2019-03-16 ENCOUNTER — Other Ambulatory Visit: Payer: Self-pay | Admitting: Pain Medicine

## 2019-03-23 ENCOUNTER — Telehealth: Payer: Self-pay

## 2019-03-23 ENCOUNTER — Telehealth: Payer: Self-pay | Admitting: Pain Medicine

## 2019-03-23 NOTE — Telephone Encounter (Signed)
LM for patient

## 2019-03-23 NOTE — Telephone Encounter (Signed)
Called Dana Bradley and informed her that forms was faxed to the number that she gave [place].

## 2019-03-23 NOTE — Telephone Encounter (Signed)
Pt has called again stating that the pharmacy is saying they aren't getting the paperwork that they need for the pt to be able to get her RX she wants to make sure we are using the right fax number it is [phone]

## 2019-03-30 ENCOUNTER — Telehealth: Payer: Self-pay

## 2019-03-30 NOTE — Telephone Encounter (Signed)
Attempted to return call, message left. 

## 2019-03-30 NOTE — Telephone Encounter (Signed)
Pt called and wants results of PT Eval that was done first of June.

## 2019-04-01 ENCOUNTER — Encounter: Payer: Self-pay | Admitting: Pain Medicine

## 2019-04-04 NOTE — Progress Notes (Signed)
Pain Management Virtual Encounter Note - Virtual Visit via Telephone Telehealth (real-time audio visits between healthcare provider and patient).   Patient's Phone No. & Preferred Pharmacy:  [phone] (home); [phone] (mobile); (Preferred) [phone] [email or web address]  [facility] [address] [place] [address] Phone: [phone] Fax: [phone]    Pre-screening note:  Our staff contacted [name] and offered her an "in person", "face-to-face" appointment versus a telephone encounter. She indicated preferring the telephone encounter, at this time.   Reason for Virtual Visit: COVID-19*  Social distancing based on CDC and AMA recommendations.   I contacted [name] on 04/05/2019 via telephone.      I clearly identified myself as Gaspar Cola, MD. I verified that I was speaking with the correct person using two identifiers (Name: Dana Bradley, and date of birth: 04-26-1971).  Advanced Informed Consent I sought verbal advanced consent from [name] for virtual visit interactions. I informed Ms. Mallinger of possible security and privacy concerns, risks, and limitations associated with providing "not-in-person" medical evaluation and management services. I also informed Ms. Ascher of the availability of "in-person" appointments. Finally, I informed her that there would be a charge for the virtual visit and that she could be  personally, fully or partially, financially responsible for it. Ms. Moman expressed understanding and agreed to proceed.   Historic Elements   Dana Bradley is a 48 y.o. year old, female patient evaluated today after her last encounter by our practice on 03/30/2019. [name]  has a past medical history of Acute postoperative pain (04/07/2017), Anxiety, Bursitis, Chronic fatigue (12/12/2017), Chronic fatigue syndrome, Edema leg (05/02/2015), Fibromyalgia, GERD (gastroesophageal reflux  disease), IBS (irritable bowel syndrome), Knee pain, bilateral (12/21/2008), Lumbar discitis, Migraines, Osteoarthritis, Right hand pain (04/10/2015), Sleep apnea, Spinal stenosis, SVT (supraventricular tachycardia) ([place]), Vertigo, and Vitamin D deficiency (05/01/2016). She also  has a past surgical history that includes Ablation; Tubal ligation (10/01/99); Cardiac catheterization; Knee arthroscopy; Colonoscopy with propofol (N/A, 05/17/2015); Esophagogastroduodenoscopy (N/A, 05/17/2015); spg; and Tibial Tubercle Bypass (Right, 1998). [name] has a current medication list which includes the following prescription(s): acetaminophen, atorvastatin, baclofen, cholecalciferol, vitamin b-12, dexlansoprazole, diclofenac sodium, magnesium oxide, melatonin, metoprolol tartrate, ondansetron, oxycodone, pregabalin, and hydrocodone-acetaminophen. She  reports that she quit smoking about 26 years ago. Her smoking use included cigarettes. She has a 12.00 pack-year smoking history. She has never used smokeless tobacco. She reports that she does not drink alcohol or use drugs. [name] is allergic to aspirin; cymbalta [duloxetine hcl]; depakote [divalproex sodium]; duloxetine; haloperidol; iodinated diagnostic agents; iodine; meperidine; reglan [metoclopramide]; tramadol hcl; trazodone; compazine [prochlorperazine]; meloxicam; penicillins; tomato; other; shellfish allergy; shellfish-derived products; bacitracin-neomycin-polymyxin; cephalosporins; ibuprofen; latex; neosporin [neomycin-bacitracin zn-polymyx]; nsaids; sulfa antibiotics; sulfasalazine; and sulfonamide derivatives.   HPI  Today, she is being contacted for worsening of previously known (established) problem.  The patient indicates having increased lower extremity pain on the left side, when compared to the right.  She describes her lower extremity pain to be worse than the low back pain.  In the case of a low back pain the left side is also worse on the right.  She is  having some difficulty walking.  The pain on the left lower extremity goes from the lower back through her hip and into the medial portion of her foot and what seems to be an L4 dermatomal distribution.  She describes having a lot of weakness in that left lower extremity to  the point where she needs to use a walker.  In the case of the right lower extremity the pain goes all the way to the ball of her foot on the side of the small toe.  This seems to follow an S1 dermatomal distribution.  The patient last had a lumbar MRI on January 07, 2017.  This MRI demonstrated some disc protrusions but primarily on the right side at the L1-2 level.  The MRI shows some spinal stenosis at that L1-2 level with an AP diameter of approximately 9 mm.  The T12 and L1, L2-3, and L3-4 levels seem to be unremarkable.  At the L4-5 level do there is evidence of degenerative disc disease with a small central disc protrusion and annular fissure.  At the L5-S1 level there is again degenerative disc disease but no significant disc herniation, central canal or foraminal compromise, according to the 01/07/2017 MRI.  However, the patient indicates that her symptoms have worsened significantly since that MRI to the point where she is having a lot of difficulty ambulating.  In addition to the above, the patient has been experiencing left elbow pain in the medial portion of the elbow.  She indicates that somebody told her that she had "golfers elbow".  In addition to this, she has been experiencing left-sided migraines with left neck and left upper extremity pain and numbness.  She indicates having had a nerve conduction test by Dr. Manuella Ghazi [facility] neurology), but he told her that it was negative for upper extremity large fiber pathology.  In addition, the patient also had a lower extremity nerve conduction test, also done by Dr. Manuella Ghazi, which was also read to be within normal limits.  I am not exactly sure what is going on with this patient,  but she seems to be describing progressive neurological problems affecting the left upper extremity and left lower extremity more than the right side.  A 2016 cervical MRI demonstrated disc protrusions at the C5-6 and C6-7 levels.  At this point, I think she would benefit from having the lumbar and cervical MRIs repeated.  However, she has some financial difficulties and she is trying to get Medicaid to cover some of her medical expenses.  For the time being, I will go ahead and order the lumbar MRI and I will also schedule her to come in for a left-sided L4 transforaminal epidural steroid injection to see if this can help some of the patient's pain until we can get some of this sorted out.  Today I went over the results of her lumbar and cervical MRI as well as her functional capacity evaluation.  The results of this FCE indicate that the patient falls within the "sedentary level".  Pharmacotherapy Assessment  Analgesic: Hydrocodone/APAP 7.5/325 one daily MME/day:7.5mg /day.   Monitoring: Pharmacotherapy: No side-effects or adverse reactions reported. [place] PMP: PDMP reviewed during this encounter.       Compliance: No problems identified. Effectiveness: Clinically acceptable. Plan: Refer to "POC".  Pertinent Labs   SAFETY SCREENING Profile Lab Results  Component Value Date   HIV NON REAC 01/12/2009   PREGTESTUR  04/23/2010    NEGATIVE        THE SENSITIVITY OF THIS METHODOLOGY IS >24 mIU/mL   Renal Function Lab Results  Component Value Date   BUN 13 11/24/2017   CREATININE 0.80 14/78/2956   BCR NOT APPLICABLE 21/30/8657   GFRAA 102 11/24/2017   GFRNONAA 88 11/24/2017   Hepatic Function Lab Results  Component Value Date  AST 14 11/24/2017   ALT 21 11/24/2017   ALBUMIN 4.1 04/01/2017   UDS Summary  Date Value Ref Range Status  03/05/2018 FINAL  Final    Comment:    ==================================================================== TOXASSURE SELECT 13  (MW) ==================================================================== Test                             Result       Flag       Units Drug Present   Hydrocodone                    422                     ng/mg creat   Hydromorphone                  105                     ng/mg creat   Norhydrocodone                 312                     ng/mg creat    Sources of hydrocodone include scheduled prescription    medications. Hydromorphone and norhydrocodone are expected    metabolites of hydrocodone. Hydromorphone is also available as a    scheduled prescription medication. ==================================================================== Test                      Result    Flag   Units      Ref Range   Creatinine              148              mg/dL      >=20 ==================================================================== Declared Medications:  Medication list was not provided. ==================================================================== For clinical consultation, please call [phone]. ====================================================================    Note: Above Lab results reviewed.  Recent imaging  NM Myocar Multi W/Spect W/Wall Motion / EF  Blood pressure demonstrated a normal response to exercise.  There was no ST segment deviation noted during stress.  No T wave inversion was noted during stress.  The study is normal.  This is a low risk study.  The left ventricular ejection fraction is normal (55-65%).    Assessment  The primary encounter diagnosis was Chronic pain syndrome. Diagnoses of Chronic low back pain (Primary Area of Pain) (Bilateral) (R>L) (midline), Chronic upper back pain (Secondary area of Pain) (Bilateral) (L>R), Chronic occipital neuralgia (Third area of Pain) (Bilateral) (L>R), Weakness of leg (Left), Difficulty walking, DDD (degenerative disc disease), lumbosacral, Lumbar radiculitis (Left), and Other intervertebral disc  degeneration, lumbar region were also pertinent to this visit.  Plan of Care  I have discontinued Breannah Lartigue's OXcarbazepine, venlafaxine XR, and desvenlafaxine. I am also having her maintain her Vitamin B-12, HYDROcodone-acetaminophen, magnesium oxide, oxyCODONE, ondansetron, Dexlansoprazole, Cholecalciferol (D3-1000 PO), atorvastatin, metoprolol tartrate, acetaminophen, Melatonin, baclofen, Diclofenac Sodium, and pregabalin.  Pharmacotherapy (Medications Ordered): No orders of the defined types were placed in this encounter.  Orders:  Orders Placed This Encounter  Procedures  . Lumbar Transforaminal Epidural    Standing Status:   Future    Standing Expiration Date:   05/06/2019    Scheduling Instructions:     Side: Left-sided     Level: L4     Sedation:  With Sedation.     Timeframe: ASAP    Order Specific Question:   Where will this procedure be performed?    Answer:   [facility]  . Walker rolling  . MR LUMBAR SPINE WO CONTRAST    In addition to any acute findings, please report on degenerative changes related to: (Please specify level(s)) (1) ROM & instability (>36mm displacement) (2) Facet joint (Zygoapophyseal Joint) (3) DDD and/or IVDD (4) Pars defects (5) Previous surgical changes (Include description of hardware and hardware status, if present) (6) Presence and degree of spondylolisthesis, spondylosis, and/or spondyloarthropathies)  (7) Old Fractures (8) Demineralization (9) Additional bone pathology (10) Stenosis (Central, Lateral Recess, Foraminal) (11) If at all possible, please provide AP diameter (mm) of foraminal and/or central canal.    Standing Status:   Future    Standing Expiration Date:   07/06/2019    Order Specific Question:   What is the patient's sedation requirement?    Answer:   No Sedation    Order Specific Question:   Does the patient have a pacemaker or implanted devices?    Answer:   No    Order Specific Question:   Preferred imaging  location?    Answer:   [facility] (table limit-350lbs)    Order Specific Question:   Call Results- Best Contact Number?    Answer:   [phone] ([facility])    Order Specific Question:   Radiology Contrast Protocol - do NOT remove file path    Answer:   [email or web address]   Follow-up plan:   Return in 6 weeks (on 05/17/2019) for (VV), E/M (MM), in addition, Procedure (w/ sedation): (L) L4 TFESI .     Considering: Diagnostic bilateral lumbar facet block #2 Possible bilateral lumbar facet RFA. Diagnostic thoracic facet block Possible bilateral thoracic facet RFA. Diagnostic bilateral cervical facet blocks Possible bilateral cervical facet RFA. Diagnostic bilateral lesser occipital nerve blocks Diagnostic bilateral C2 and TON nerve block Possible bilateral occipital nerve RFA. Diagnostic right T10-11 TESI Diagnostic bilateral suprascapular nerve block Diagnostic bilateral intra-articular knee joint injection Possible bilateral intra-articular Hyalgan knee injections.  Diagnostic bilateral Genicular nerve block Possible bilateral Genicular nerve RFA.   Palliative PRN treatment(s): Palliative left trapezius muscle MNB Palliative left L4-5 LESI PalliativeLeft CESI    Recent Visits Date Type Provider Dept  02/16/19 Office Visit Vevelyn Francois, NP [facility]  Showing recent visits within past 90 days and meeting all other requirements   Today's Visits Date Type Provider Dept  04/05/19 Office Visit Milinda Pointer, MD [facility]  Showing today's visits and meeting all other requirements   Future Appointments No visits were found meeting these conditions.  Showing future appointments within next 90 days and meeting all other requirements   I discussed the assessment and treatment plan with the patient. The patient was provided an opportunity to ask questions and all were answered. The  patient agreed with the plan and demonstrated an understanding of the instructions.  Patient advised to call back or seek an in-person evaluation if the symptoms or condition worsens.  Total duration of non-face-to-face encounter: 25 minutes.  Note by: Gaspar Cola, MD Date: 04/05/2019; Time: 2:03 PM  Note: This dictation was prepared with Dragon dictation. Any transcriptional errors that may result from this process are unintentional.  Disclaimer:  * Given the special circumstances of the COVID-19 pandemic, the federal government has announced that the Office for Civil Rights (OCR) will exercise its enforcement discretion and  will not impose penalties on physicians using telehealth in the event of noncompliance with regulatory requirements under the [place] and Accountability Act (HIPAA) in connection with the good faith provision of telehealth during the UZRVU-34 national public health emergency. (AMA)

## 2019-04-05 ENCOUNTER — Ambulatory Visit: Payer: Self-pay | Attending: Pain Medicine | Admitting: Pain Medicine

## 2019-04-05 ENCOUNTER — Other Ambulatory Visit: Payer: Self-pay

## 2019-04-05 DIAGNOSIS — M51379 Other intervertebral disc degeneration, lumbosacral region without mention of lumbar back pain or lower extremity pain: Secondary | ICD-10-CM

## 2019-04-05 DIAGNOSIS — M5481 Occipital neuralgia: Secondary | ICD-10-CM

## 2019-04-05 DIAGNOSIS — M5441 Lumbago with sciatica, right side: Secondary | ICD-10-CM

## 2019-04-05 DIAGNOSIS — G8929 Other chronic pain: Secondary | ICD-10-CM

## 2019-04-05 DIAGNOSIS — M549 Dorsalgia, unspecified: Secondary | ICD-10-CM

## 2019-04-05 DIAGNOSIS — M5416 Radiculopathy, lumbar region: Secondary | ICD-10-CM

## 2019-04-05 DIAGNOSIS — G894 Chronic pain syndrome: Secondary | ICD-10-CM

## 2019-04-05 DIAGNOSIS — R262 Difficulty in walking, not elsewhere classified: Secondary | ICD-10-CM

## 2019-04-05 DIAGNOSIS — M5442 Lumbago with sciatica, left side: Secondary | ICD-10-CM

## 2019-04-05 DIAGNOSIS — M51369 Other intervertebral disc degeneration, lumbar region without mention of lumbar back pain or lower extremity pain: Secondary | ICD-10-CM

## 2019-04-05 DIAGNOSIS — R29898 Other symptoms and signs involving the musculoskeletal system: Secondary | ICD-10-CM | POA: Insufficient documentation

## 2019-04-05 DIAGNOSIS — M5137 Other intervertebral disc degeneration, lumbosacral region: Secondary | ICD-10-CM

## 2019-04-05 DIAGNOSIS — M5136 Other intervertebral disc degeneration, lumbar region: Secondary | ICD-10-CM

## 2019-04-05 NOTE — Patient Instructions (Signed)

## 2019-04-06 ENCOUNTER — Ambulatory Visit (INDEPENDENT_AMBULATORY_CARE_PROVIDER_SITE_OTHER): Payer: Self-pay | Admitting: Neurology

## 2019-04-06 ENCOUNTER — Encounter: Payer: Self-pay | Admitting: Neurology

## 2019-04-06 ENCOUNTER — Telehealth: Payer: Self-pay | Admitting: Neurology

## 2019-04-06 VITALS — BP 109/69 | HR 71 | Temp 98.6°F | Ht 63.0 in | Wt 214.0 lb

## 2019-04-06 DIAGNOSIS — G43711 Chronic migraine without aura, intractable, with status migrainosus: Secondary | ICD-10-CM

## 2019-04-06 DIAGNOSIS — R270 Ataxia, unspecified: Secondary | ICD-10-CM

## 2019-04-06 DIAGNOSIS — H814 Vertigo of central origin: Secondary | ICD-10-CM

## 2019-04-06 DIAGNOSIS — W19XXXD Unspecified fall, subsequent encounter: Secondary | ICD-10-CM

## 2019-04-06 DIAGNOSIS — M5481 Occipital neuralgia: Secondary | ICD-10-CM

## 2019-04-06 DIAGNOSIS — G43101 Migraine with aura, not intractable, with status migrainosus: Secondary | ICD-10-CM

## 2019-04-06 DIAGNOSIS — R29898 Other symptoms and signs involving the musculoskeletal system: Secondary | ICD-10-CM

## 2019-04-06 DIAGNOSIS — R443 Hallucinations, unspecified: Secondary | ICD-10-CM

## 2019-04-06 DIAGNOSIS — M542 Cervicalgia: Secondary | ICD-10-CM

## 2019-04-06 DIAGNOSIS — G441 Vascular headache, not elsewhere classified: Secondary | ICD-10-CM

## 2019-04-06 MED ORDER — EMGALITY 120 MG/ML ~~LOC~~ SOAJ: 120.0000 mg | SUBCUTANEOUS | 11 refills | Status: DC

## 2019-04-06 NOTE — Patient Instructions (Addendum)
Try to get Botox - will call MRI brain and cervical spine Injected Emgality today - you have 2 months of samples start next injection 05/07/2019 If insurance, [facility] would be next step for occipital neuralgia

## 2019-04-06 NOTE — Progress Notes (Addendum)
[facility]    Provider:  Dr Jaynee Eagles Requesting Provider: Hubbard Hartshorn, FNP Primary Care Provider:  Hubbard Hartshorn, FNP  CC:  Chronic migraines, falls, pain  Interval history 04/06/2019: She is not doing well. She is smelling weird things when something is not there which she says has changed and is different and new, having these episodes.She is having severe headaches, 3-4 days in a dark room a week, she is using her cpap religiously. She is having problems walking, falls. She has not been able to work for years. She is using a walker more recently. She has significant fatigue. She has been having headaches for years, the olfactory hallucinations are new and need to evaluate for seizure focus however this is likely migraine aura. She has daily headaches, and daily migraines. 2-3 days in a week or more she is incapacitated and in bed for 24 hours. She may or may not have an aura. She can't function, trying to get disability. Her insurance status will make it difficult to get botox but we will try. No medication overuse, she rarely take the hydrocodone. Nausea, vomiting, light and sound sensitivity. Starts in the left occipital area, follows the occipital like a "3 fingered claw" from the left occipita and neck area and runs down across shoulder into the left arm, decreased ROM, chronic nack pain, ataxia and radiculopathic nerve pain all the way down the left arm.   HPI:  Dana Bradley is a 48 y.o. female here as requested by Hubbard Hartshorn, FNP for migraines and visual disturbances. PMHx migraines and visual disturbances, anxiety, chronic fatigue, Fibromyalgia, IBS, sleep apnea, vertigo, obesity, chronic pain uses a walker.  Had first migraine when pregnant. They were fine until 2008 and headaches/migraines regularly once a week, bad but bearable. Worsening ove rthe years. The last 4 years having daily migraines that last 24-48 hours or sometimes up to 3 days in a row for the same  migraine. No aura.  No pattern for when they start, the pain wakes her up and she wakes up in the morning. But can start int he middle of the afternoon. Unknown triggers. Starts in the back of the neck on the left side, runs over the head to her eye and to the left side of the head, it is pulsating/throbbing, waves of pain, nausea, light and sound sensitivity, vomiting, movement makes it worse and a dark room may help a little, radiates to the arm and shoulder blade. She cries. They are severe or moderately severe. She has neck pain.No difference since 2016 since last MRI in frequency or severity or quality, discussed MRI but since no changes we agreed to hold off but keep in mind. She is very compliant with her cpap. Ajovy helped initially but by month 4 it was the same pain. Migraines can last 24-72 hours or longer on average 48 hours. No medication overuse. She doesn;t take anythng because nothing works. Tylenol 3x a month. No aura. No medication overuse.No other focal neurologic deficits, associated symptoms, inciting events or modifiable factors.  Reviewed notes, labs and imaging from outside physicians, which showed:   Migraine meds tried: baclofen, metoprolol, zofran, trileptal, lyrica, sumatriptan, elavil, ajovy, Topamax, amitriptyline, nortriptyline, SPG, RFA on the occipital nerve, trigger point.   CT 12/2014 and MRI brain wo 01/2015  showed no acute intracranial abnormalities including mass lesion or mass effect, hydrocephalus, extra-axial fluid collection, midline shift, hemorrhage, or acute infarction, large ischemic events (personally reviewed images)  ANA,  CRP, Sed Rate, cbc normal   Appears symptoms ongoing for years. In 2016 had headaches, visual disturbances, olfactory hallucinations, numbness and tingling of hands and feet and had an evaluation to include MRI brain    Review of Systems: Patient complains of symptoms per HPI as well as the following symptoms: headaches, sleep apnea.  Pertinent negatives and positives per HPI. All others negative.   Social History   Socioeconomic History  . Marital status: Married    Spouse name: brian  . Number of children: 3  . Years of education: Not on file  . Highest education level: Associate degree: academic program  Occupational History  . Occupation: disbled    Comment: not able  Social Needs  . Financial resource strain: Very hard  . Food insecurity    Worry: Often true    Inability: Often true  . Transportation needs    Medical: Yes    Non-medical: Yes  Tobacco Use  . Smoking status: Former Smoker    Packs/day: 4.00    Years: 3.00    Pack years: 12.00    Types: Cigarettes    Quit date: 12/10/1992    Years since quitting: 26.3  . Smokeless tobacco: Never Used  . Tobacco comment: quit 25 years ago  Substance and Sexual Activity  . Alcohol use: No    Alcohol/week: 0.0 standard drinks  . Drug use: No  . Sexual activity: Not Currently    Partners: Male    Birth control/protection: Surgical  Lifestyle  . Physical activity    Days per week: 0 days    Minutes per session: 0 min  . Stress: Rather much  Relationships  . Social Herbalist on phone: Never    Gets together: Never    Attends religious service: More than 4 times per year    Active member of club or organization: No    Attends meetings of clubs or organizations: Never    Relationship status: Married  . Intimate partner violence    Fear of current or ex partner: No    Emotionally abused: No    Physically abused: No    Forced sexual activity: No  Other Topics Concern  . Not on file  Social History Narrative   Lives at home with her husband and 2 of her children   Right handed   Caffeine: 0-2 cups daily    Family History  Problem Relation Age of Onset  . Depression Mother   . Hypertension Mother   . Cancer Mother        Skin  . Hyperlipidemia Mother   . Anxiety disorder Mother   . Migraines Mother   . Alcohol abuse Father    . Depression Father   . Stroke Father   . Heart disease Father   . Hypertension Father   . Anxiety disorder Father   . Depression Sister   . Hyperlipidemia Sister   . Diabetes Sister   . Hypertension Sister   . Polycystic ovary syndrome Sister   . Bipolar disorder Sister   . Anxiety disorder Sister   . Migraines Sister   . Cancer Maternal Grandmother 74       Breast  . Thyroid disease Maternal Grandmother   . Arthritis Maternal Grandmother   . Hyperlipidemia Maternal Grandmother   . Depression Sister   . Hypertension Sister   . Anxiety disorder Sister   . Migraines Sister   . Alzheimer's disease Other   . Aneurysm  Maternal Grandfather   . Hypertension Maternal Grandfather   . Heart disease Maternal Grandfather   . Alzheimer's disease Paternal Grandmother   . Heart attack Paternal Grandfather   . Hypertension Paternal Grandfather   . COPD Paternal Grandfather   . Heart disease Paternal Grandfather   . Migraines Son   . Migraines Daughter   . Migraines Daughter   . Bladder Cancer Neg Hx   . Kidney cancer Neg Hx     Past Medical History:  Diagnosis Date  . Acute postoperative pain 04/07/2017  . Anxiety   . Bursitis   . Chronic fatigue 12/12/2017  . Chronic fatigue syndrome   . Edema leg 05/02/2015  . Fibromyalgia   . GERD (gastroesophageal reflux disease)   . IBS (irritable bowel syndrome)   . Knee pain, bilateral 12/21/2008   Qualifier: Diagnosis of  By: Hassell Done FNP, Tori Milks    . Lumbar discitis   . Migraines   . Osteoarthritis   . Right hand pain 04/10/2015   [facility] has done nerve conduction studies and ruled out carpal tunnel.   . Sleep apnea   . Spinal stenosis   . SVT (supraventricular tachycardia) ([place])   . Vertigo   . Vitamin D deficiency 05/01/2016    Patient Active Problem List   Diagnosis Date Noted  . Migraine with aura and with status migrainosus, not intractable 04/06/2019  . Cervico-occipital neuralgia of left side 04/06/2019  .  Weakness of leg (Left) 04/05/2019  . Difficulty walking 04/05/2019  . Chronic migraine without aura, with intractable migraine, so stated, with status migrainosus 12/27/2018  . Malar rash 09/04/2018  . Trigger point of neck (Left) 03/19/2018  . Occipital headache 12/25/2017  . Low serum cortisol level ([place]) 12/12/2017  . Chronic fatigue syndrome with fibromyalgia 12/12/2017  . Trigger point of shoulder region (Left) 11/17/2017  . Myofascial pain syndrome (Left) (trapezius muscle) 07/22/2017  . Lumbar L1-2 disc protrusion (Right) 04/07/2017  . Muscle spasticity 04/01/2017  . 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 Lumbar spondylosis 01/06/2017  . Chronic left hip pain 12/24/2016  . Chronic sacroiliac joint pain (Left) 12/24/2016  . Lumbar facet joint syndrome (B) (L>R) 12/24/2016  . Lumbar radiculitis (Left) 12/24/2016  . Hypertriglyceridemia 11/27/2016  . History of vasovagal episode 10/30/2016  . Cervicogenic headache 09/09/2016  . Medication monitoring encounter 08/29/2016  . Controlled substance agreement signed 08/28/2016  . Plantar fasciitis of left foot 08/28/2016  . Vitamin B12 deficiency 08/28/2016  . Hyperlipidemia 08/28/2016  . Nephrolithiasis 08/12/2016  . Chronic pain syndrome 08/07/2016  . Long term prescription opiate use 08/07/2016  . Opiate use 08/07/2016  . Long term prescription benzodiazepine use 08/07/2016  . Neurogenic pain 08/07/2016  . Chronic low back pain (Primary Area of Pain) (Bilateral) (R>L) (midline) 08/07/2016  . Chronic upper back pain (Secondary area of Pain) (Bilateral) (L>R) 08/07/2016  . Chronic abdominal pain (Right lower quadrant) 08/07/2016  . Thoracic radiculitis (Right) (T11 dermatome) 08/07/2016  . Chronic occipital neuralgia (Third area of Pain) (Bilateral) (L>R) 08/07/2016  . Chronic neck pain 08/07/2016  . Chronic cervical radicular pain (Bilateral) (L>R) 08/07/2016  . Chronic shoulder blade pain (Bilateral) (L>R) 08/07/2016  .  Chronic upper extremity pain (Bilateral) (R>L) 08/07/2016  . Chronic knee pain (Bilateral) (R>L) 08/07/2016  . Chronic ankle pain (Bilateral) 08/07/2016  . Cervical spondylosis with myelopathy and radiculopathy 08/07/2016  . Panic disorder with agoraphobia 05/29/2016  . Depression, unspecified depression type 05/29/2016  . Atypical lymphocytosis 05/01/2016  . Vitamin  D insufficiency 05/01/2016  . Chronic lower extremity cramps (Bilateral) (R>L) 04/29/2016  . Obesity 04/29/2016  . GAD (generalized anxiety disorder) 04/29/2016  . Fatigue 04/29/2016  . Insomnia 07/12/2015  . Migraine without aura and with status migrainosus, not intractable 07/12/2015  . Chronic superficial gastritis 06/02/2015  . Chronic pain of multiple joints 05/15/2015  . Bilateral leg edema 05/02/2015  . Paroxysmal supraventricular tachycardia ([place]) 04/17/2015  . Exertional shortness of breath 04/17/2015  . Bright red rectal bleeding 04/06/2015  . DDD (degenerative disc disease), lumbosacral 01/24/2014  . DDD (degenerative disc disease), lumbar 01/24/2014  . Cervico-occipital neuralgia 12/29/2013  . Fibromyalgia 12/29/2013  . Migraine headache 12/29/2013  . Menorrhagia 12/10/2012  . Depression, major, recurrent, in remission ([place]) 01/12/2009  . Chest pain 01/12/2009  . Hypertension, benign essential, goal below 140/90 06/23/2008  . History of PSVT (paroxysmal supraventricular tachycardia) 06/17/2008  . Obstructive sleep apnea, adult 06/17/2008  . GERD 06/13/2008    Past Surgical History:  Procedure Laterality Date  . ABLATION     Uterine  . CARDIAC CATHETERIZATION     with ablation  . COLONOSCOPY WITH PROPOFOL N/A 05/17/2015   Procedure: COLONOSCOPY WITH PROPOFOL;  Surgeon: Manya Silvas, MD;  Location: [facility] ENDOSCOPY;  Service: Endoscopy;  Laterality: N/A;  . ESOPHAGOGASTRODUODENOSCOPY N/A 05/17/2015   Procedure: ESOPHAGOGASTRODUODENOSCOPY (EGD);  Surgeon: Manya Silvas, MD;  Location: [facility]  ENDOSCOPY;  Service: Endoscopy;  Laterality: N/A;  . KNEE ARTHROSCOPY    . spg     6/18  . Tibial Tubercle Bypass Right 1998  . TUBAL LIGATION  10/01/99    Current Outpatient Medications  Medication Sig Dispense Refill  . acetaminophen (TYLENOL) 500 MG tablet Take 500-1,000 mg by mouth as needed.    Marland Kitchen atorvastatin (LIPITOR) 40 MG tablet Take 1 tablet (40 mg total) by mouth at bedtime. This replaces crestor (rosuvastatin) 90 tablet 1  . baclofen (LIORESAL) 10 MG tablet Take 1 tablet (10 mg total) by mouth 3 (three) times daily. (Patient taking differently: Take 10 mg by mouth 3 (three) times daily. Uses as needed.) 90 tablet 2  . Cholecalciferol (D3-1000 PO) Take 1,000 mg by mouth daily.    . Cyanocobalamin (VITAMIN B-12) 500 MCG SUBL Place 1,500 mcg under the tongue every other day.  150 tablet   . Dexlansoprazole (DEXILANT) 30 MG capsule Take 1 capsule (30 mg total) by mouth daily. This replaces omeprazole 30 capsule 2  . Diclofenac Sodium 3 % GEL Place 1 application onto the skin 4 (four) times daily as needed. 4 g 2  . HYDROcodone-acetaminophen (NORCO) 7.5-325 MG tablet Take 1 tablet by mouth every 6 (six) hours as needed for severe pain. 30 tablet 0  . magnesium oxide (MAG-OX) 400 MG tablet Take 300 mg by mouth daily.     . Melatonin 10 MG TABS Take 10 mg by mouth at bedtime.    . metoprolol tartrate (LOPRESSOR) 50 MG tablet Take 1 tablet (50 mg total) by mouth 2 (two) times daily. 180 tablet 1  . ondansetron (ZOFRAN) 4 MG tablet Take 1 tablet (4 mg total) by mouth every 8 (eight) hours as needed. 20 tablet 2  . oxyCODONE (OXY IR/ROXICODONE) 5 MG immediate release tablet Take 5 mg by mouth every 4 (four) hours as needed for severe pain.    . pregabalin (LYRICA) 150 MG capsule Take 1 capsule (150 mg total) by mouth every 8 (eight) hours. 270 capsule 0  . VALERIAN PO Take by mouth as needed. Makes Valerian  tea about 3-4 times per week.    . Galcanezumab-gnlm (EMGALITY) 120 MG/ML SOAJ  Inject 120 mg into the skin every 30 (thirty) days. 1 pen 11   No current facility-administered medications for this visit.     Allergies as of 04/06/2019 - Review Complete 04/06/2019  Allergen Reaction Noted  . Aspirin Swelling 06/13/2008  . Cymbalta [duloxetine hcl] Other (See Comments) 09/09/2016  . Depakote [divalproex sodium] Shortness Of Breath 12/10/2012  . Duloxetine Other (See Comments) 09/17/2016  . Haloperidol Shortness Of Breath 05/15/2015  . Iodinated diagnostic agents Other (See Comments) 05/15/2015  . Iodine Shortness Of Breath 01/23/2015  . Meperidine Nausea And Vomiting 12/10/2012  . Reglan [metoclopramide] Shortness Of Breath 09/16/2016  . Tramadol hcl Palpitations 09/09/2016  . Trazodone Shortness Of Breath 05/15/2015  . Compazine [prochlorperazine] Other (See Comments) 09/16/2016  . Meloxicam Other (See Comments) 03/17/2014  . Penicillins Rash 06/13/2008  . Tomato Hives 04/29/2016  . Other  03/17/2014  . Shellfish allergy  03/17/2014  . Shellfish-derived products Other (See Comments) 01/23/2015  . Bacitracin-neomycin-polymyxin Rash 05/15/2015  . Cephalosporins Rash 05/15/2015  . Ibuprofen Other (See Comments) and Rash 01/05/2013  . Latex Itching 03/17/2014  . Neosporin [neomycin-bacitracin zn-polymyx] Rash 12/10/2012  . Nsaids Other (See Comments) 03/17/2014  . Sulfa antibiotics Rash 03/17/2014  . Sulfasalazine Rash 06/13/2008  . Sulfonamide derivatives Rash 06/13/2008    Vitals: BP 109/69 (BP Location: Right Arm, Patient Position: Sitting)   Pulse 71   Temp 98.6 F (37 C) Comment: taken by front staff  Ht 5\' 3"  (1.6 m)   Wt 214 lb (97.1 kg)   BMI 37.91 kg/m  Last Weight:  Wt Readings from Last 1 Encounters:  04/06/19 214 lb (97.1 kg)   Last Height:   Ht Readings from Last 1 Encounters:  04/06/19 5\' 3"  (1.6 m)    Physical exam: Exam: Gen: NAD, conversant, well nourised, obese, well groomed                     CV: RRR, no MRG. No Carotid  Bruits. No peripheral edema, warm, nontender Eyes: Conjunctivae clear without exudates or hemorrhage  Neuro: Detailed Neurologic Exam  Speech:    Speech is normal; fluent and spontaneous with normal comprehension.  Cognition:    The patient is oriented to person, place, and time;     recent and remote memory intact;     language fluent;     normal attention, concentration,     fund of knowledge Cranial Nerves:    The pupils are equal, round, and reactive to light. The fundi are normal and spontaneous venous pulsations are present. Visual fields are full to finger confrontation. Extraocular movements are intact. Trigeminal sensation is intact and the muscles of mastication are normal. The face is symmetric. The palate elevates in the midline. Hearing intact. Voice is normal. Shoulder shrug is normal. The tongue has normal motion without fasciculations.   Coordination:    Normal dysmetria  Gait:    Using a cane, antalgic, wide based  Motor Observation:    No asymmetry, no atrophy, and no involuntary movements noted. Tone:    Normal muscle tone.    Posture:    Slightly lordotic possibly body habitus    Strength:left hip flexion 4/5 possibly due to pain unclear if weakness, otherwise strength is V/V in the upper and lower limbs.      Sensation: intact to LT     Reflex Exam:  DTR's:  Deep tendon reflexes in the upper and lower extremities are brisk bilaterally.   Toes:    The toes are downgoing bilaterally.   Clonus:    2 beats AJs bilat  Assessment/Plan:  48 y.o. female here as requested by Hubbard Hartshorn, FNP for migraines and visual disturbances. PMHx migraines and visual disturbances, anxiety, chronic fatigue, Fibromyalgia, IBS, sleep apnea, vertigo, obesity, chronic pain.   Migraine meds/procedures tried: baclofen, metoprolol, zofran, trileptal, lyrica, sumatriptan, elavil, ajovy, Topamax, amitriptyline, nortriptyline, SPG, RFA on the occipital nerve, trigger  point.  - Chronic migraines, intractable - emgality, gave her samples and injected her today. She has 2 more months of samples total 3 months. Unclear if we can continue given her lack of insurance. - MRI of the brain w/wo contrast - MRI brain due to concerning symptoms of chronic intractable headache, olfactory hallucinations  to look for space occupying mass, chiari or intracranial hypertension (pseudotumor), olfactory hallucinations also look for seizure focus  - Chronic neck pain - she has reported difficulty walking, using a walking aid today, She has brisk reflexes and 2 beats AJ which may be normal for her but needs MRI of the cervical spine to eval for cervical radic with myelopathy.  - Left occipital neuralgia - if she can get insurance can send to [facility] for eval of gamma knife but she has failed many medications and procedures she may need an academic headache center  - Try to get botox approval, foundation or maybe Dr. Letta Pate can provide it via her [facility] [employer]  Botox - She has failed multiple classes including CGRP. At this point she needs botox for migraines. (May try to layer Emgality over it at a later time if needed but she failed Ajovy. Has constipation will not try Aimovig). Try to get botox approval, foundation or maybe Dr. Letta Pate' team can provide it via her [facility] [employer]   Cervical myofascial pain syndrome: dry Needling and PT  Discussed: There is increased risk for stroke in women with migraine with aura and a contraindication for the combined contraceptive pill for use by women who have migraine with aura. The risk for women with migraine without aura is lower. However other risk factors like smoking are far more likely to increase stroke risk than migraine. There is a recommendation for no smoking and for the use of OCPs without estrogen such as progestogen only pills particularly for women with migraine with aura.Marland Kitchen People who have migraine  headaches with auras may be 3 times more likely to have a stroke caused by a blood clot, compared to migraine patients who don't see auras. Women who take hormone-replacement therapy may be 30 percent more likely to suffer a clot-based stroke than women not taking medication containing estrogen. Other risk factors like smoking and high blood pressure may be  much more important.  Meds ordered this encounter  Medications  . Galcanezumab-gnlm (EMGALITY) 120 MG/ML SOAJ    Sig: Inject 120 mg into the skin every 30 (thirty) days.    Dispense:  1 pen    Refill:  11   Orders Placed This Encounter  Procedures  . MR BRAIN W WO CONTRAST  . MR CERVICAL SPINE WO CONTRAST  . Ambulatory referral to Physical Medicine Rehab   Discussed: To prevent or relieve headaches, try the following: Cool Compress. Lie down and place a cool compress on your head.  Avoid headache triggers. If certain foods or odors seem to have triggered your migraines in the past,  avoid them. A headache diary might help you identify triggers.  Include physical activity in your daily routine. Try a daily walk or other moderate aerobic exercise.  Manage stress. Find healthy ways to cope with the stressors, such as delegating tasks on your to-do list.  Practice relaxation techniques. Try deep breathing, yoga, massage and visualization.  Eat regularly. Eating regularly scheduled meals and maintaining a healthy diet might help prevent headaches. Also, drink plenty of fluids.  Follow a regular sleep schedule. Sleep deprivation might contribute to headaches Consider biofeedback. With this mind-body technique, you learn to control certain bodily functions - such as muscle tension, heart rate and blood pressure - to prevent headaches or reduce headache pain.    Proceed to emergency room if you experience new or worsening symptoms or symptoms do not resolve, if you have new neurologic symptoms or if headache is severe, or for any concerning  symptom.   Provided education and documentation from American headache Society toolbox including articles on: chronic migraine medication overuse headache, chronic migraines, prevention of migraines, behavioral and other nonpharmacologic treatments for headache.  A total of 40 minutes was spent face-to-face with this patient. Over half this time was spent on counseling patient on the  1. Chronic migraine without aura, with intractable migraine, so stated, with status migrainosus   2. Migraine with aura and with status migrainosus, not intractable   3. Cervicalgia   4. Cervico-occipital neuralgia of left side   5. Hallucinations   6. Left arm weakness   7. Ataxia   8. Fall, subsequent encounter    diagnosis and different diagnostic and therapeutic options, counseling and coordination of care, risks ans benefits of management, compliance, or risk factor reduction and education.    Cc: Hubbard Hartshorn, FNP,    Sarina Ill, MD  [facility] [address] [place], [place] [address]  Phone [phone] Fax [phone]

## 2019-04-06 NOTE — Telephone Encounter (Signed)
self pay order sent to GI. They will reach out to the patient to schedule.  °

## 2019-04-20 ENCOUNTER — Encounter: Payer: Self-pay | Admitting: Nurse Practitioner

## 2019-04-20 ENCOUNTER — Ambulatory Visit (INDEPENDENT_AMBULATORY_CARE_PROVIDER_SITE_OTHER): Payer: Self-pay | Admitting: Nurse Practitioner

## 2019-04-20 ENCOUNTER — Other Ambulatory Visit: Payer: Self-pay

## 2019-04-20 VITALS — BP 104/60 | HR 92 | Temp 97.7°F | Resp 14 | Ht 63.0 in | Wt 214.6 lb

## 2019-04-20 DIAGNOSIS — L2489 Irritant contact dermatitis due to other agents: Secondary | ICD-10-CM

## 2019-04-20 DIAGNOSIS — M25522 Pain in left elbow: Secondary | ICD-10-CM

## 2019-04-20 DIAGNOSIS — I471 Supraventricular tachycardia: Secondary | ICD-10-CM

## 2019-04-20 MED ORDER — PREDNISONE 10 MG (21) PO TBPK: ORAL_TABLET | ORAL | 0 refills | Status: DC

## 2019-04-20 MED ORDER — TRIAMCINOLONE ACETONIDE 0.1 % EX CREA: 1.0000 "application " | TOPICAL_CREAM | Freq: Two times a day (BID) | CUTANEOUS | 0 refills | Status: DC

## 2019-04-20 NOTE — Patient Instructions (Addendum)
- Please get a Counterforce brace or (some patients prefer compression sleeve) to help with area - take prednisone as prescribed if pain is worsening - Follow-up with ortho if not improving.   Golfer's Elbow  Golfer's elbow, also called medial epicondylitis, is a condition that results from inflammation of the strong bands of tissue (tendons) that attach your forearm muscles to the inside of your bone at the elbow. These tendons affect the muscles that bend the palm toward the wrist (flexion). The tendons become less flexible with age. This condition is called golfer's elbow because it is more common among people who constantly bend and twist their wrists, such as golfers. This injury is usually caused by overuse. What are the causes? This condition is caused by:  Repeatedly flexing, turning, or twisting your wrist.  Constantly gripping objects with your hands. What increases the risk? This condition is more likely to develop in people who play golf, baseball, or tennis. This injury is more common among people who have jobs that require the constant use of their hands, such as:  Carpenters.  Butchers.  Musicians.  Typists. What are the signs or symptoms? This condition causes elbow pain that may spread to your forearm and upper arm. Symptoms of this condition include.  Pain at the inner elbow, forearm, or wrist.  Reduced grip strength. The pain may get worse when you bend your wrist downward. How is this diagnosed? This condition is diagnosed based on your symptoms, medical history, and a physical exam. During the exam, your health care provider may:  Test your grip strength.  Move your wrist to check for pain. You may also have an MRI to:  Confirm the diagnosis.  Look for other issues.  Check for tears in the ligaments, muscles, or tendons. How is this treated? Treatment for this condition includes:  Stopping all activities that make you bend or twist your elbow or  wrist and waiting until your pain and other symptoms go away before resuming those activities.  Wearing an elbow brace or wrist splint to restrict the movements that cause symptoms.  Icing your inner elbow, forearm, or wrist to relieve pain.  Taking NSAIDs or getting corticosteroid injections to reduce pain and swelling.  Doing stretching, range-of-motion, and strengthening exercises (physical therapy) as told by your health care provider. In rare cases, surgery may be needed if your condition does not improve. Follow these instructions at home: If you have a brace or splint:  Wear it as told by your health care provider.  Loosen it if your fingers tingle, become numb, or turn cold and blue.  Keep it clean. Managing pain, stiffness, and swelling   If directed, put ice on the injured area. ? Put ice in a plastic bag. ? Place a towel between your skin and the bag. ? Leave the ice on for 20 minutes, 2-3 times a day.  Move your fingers often to avoid stiffness.  Raise (elevate) the injured area above the level of your heart while you are sitting or lying down. Activity  Rest your injured area as told by your health care provider.  Return to your normal activities as told by your health care provider. Ask your health care provider what activities are safe for you.  Do exercises as told by your health care provider. Lifestyle  If your condition is caused by sports, work with a trainer to make sure that you: ? Have the correct technique. ? Are using the proper equipment.  If  your condition is work related, talk with your employer about changes that can be made. General instructions  Take over-the-counter and prescription medicines only as told by your health care provider.  Do not use any products that contain nicotine or tobacco, such as cigarettes, e-cigarettes, and chewing tobacco. If you need help quitting, ask your health care provider.  Keep all follow-up visits as  told by your health care provider. This is important. How is this prevented?  Before and after activity: ? Warm up and stretch before being active. ? Cool down and stretch after being active. ? Give your body time to rest between periods of activity.  During activity: ? Make sure to use equipment that fits you. ? If you play golf, slow your golf swing to reduce shock in the arm when making contact with the ball.  Maintain physical fitness, including: ? Strength. ? Flexibility. ? Cardiovascular fitness. ? Endurance.  Do exercises to strengthen the forearm muscles. Contact a health care provider if:  Your pain does not improve or it gets worse.  You notice numbness in your hand. Get help right away if:  Your pain is severe.  You cannot move your wrist. Summary  Golfer's elbow, also called medial epicondylitis, is a condition that results from inflammation of the strong bands of tissue (tendons) that attach your forearm muscles to the inside of your bone at the elbow.  This injury usually results from overuse.  Symptoms of this condition include decreased grip strength and pain at the inner elbow, forearm, or wrist.  This injury is treated with rest, ice, medicines, physical therapy, and surgery as needed. This information is not intended to replace advice given to you by your health care provider. Make sure you discuss any questions you have with your health care provider. Document Released: 09/16/2005 Document Revised: 01/07/2019 Document Reviewed: 07/23/2018 Elsevier Patient Education  2020 [employer].

## 2019-04-20 NOTE — Progress Notes (Signed)
Name: Dana Bradley   MRN: [id number]    DOB: 01/29/1971   Date:04/20/2019       Progress Note  Subjective  Chief Complaint  Chief Complaint  Patient presents with  . Elbow Problem    Pain, onset February    HPI  Patient endorses left elbow pain that has been going on for 5 months states and pressure, weight or bending causes worsening pain. Patient states sometimes her hands get numb. States has tried Voltaren gel, ice and heat, rest, without relief. Pain has progressively getting worse in intensity and frequency.  Unable to tolerate NSAIDs due to oral swelling.   Paroxysmal SVT Takes metoprolol 50mg  BID, was following up with cardiology- Dr. Rockey Situ and cleared by them  Obesity Eats mostly garden foods and has been cooking, does eat a lot of sweets though. Wt Readings from Last 3 Encounters:  04/20/19 214 lb 9.6 oz (97.3 kg)  04/06/19 214 lb (97.1 kg)  11/25/18 213 lb 6.4 oz (96.8 kg)    PHQ2/9: Depression screen [facility] 2/9 04/20/2019 11/25/2018 09/04/2018 07/30/2018 04/07/2018  Decreased Interest 0 2 2 0 0  Down, Depressed, Hopeless 0 1 1 0 0  PHQ - 2 Score 0 3 3 0 0  Altered sleeping 0 3 3 2  -  Tired, decreased energy 0 3 3 2  -  Change in appetite 0 2 3 1  -  Feeling bad or failure about yourself  0 1 2 1  -  Trouble concentrating 0 3 1 0 -  Moving slowly or fidgety/restless 0 0 0 0 -  Suicidal thoughts 0 0 1 0 -  PHQ-9 Score 0 15 16 6  -  Difficult doing work/chores Not difficult at all Very difficult Somewhat difficult Not difficult at all -  Some recent data might be hidden     PHQ reviewed. Negative  Patient Active Problem List   Diagnosis Date Noted  . Migraine with aura and with status migrainosus, not intractable 04/06/2019  . Cervico-occipital neuralgia of left side 04/06/2019  . Weakness of leg (Left) 04/05/2019  . Difficulty walking 04/05/2019  . Chronic migraine without aura, with intractable migraine, so stated, with status migrainosus 12/27/2018  . Malar rash  09/04/2018  . Trigger point of neck (Left) 03/19/2018  . Occipital headache 12/25/2017  . Low serum cortisol level ([place]) 12/12/2017  . Chronic fatigue syndrome with fibromyalgia 12/12/2017  . Trigger point of shoulder region (Left) 11/17/2017  . Myofascial pain syndrome (Left) (trapezius muscle) 07/22/2017  . Lumbar L1-2 disc protrusion (Right) 04/07/2017  . Muscle spasticity 04/01/2017  . 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 Lumbar spondylosis 01/06/2017  . Chronic left hip pain 12/24/2016  . Chronic sacroiliac joint pain (Left) 12/24/2016  . Lumbar facet joint syndrome (B) (L>R) 12/24/2016  . Lumbar radiculitis (Left) 12/24/2016  . Hypertriglyceridemia 11/27/2016  . History of vasovagal episode 10/30/2016  . Cervicogenic headache 09/09/2016  . Medication monitoring encounter 08/29/2016  . Controlled substance agreement signed 08/28/2016  . Plantar fasciitis of left foot 08/28/2016  . Vitamin B12 deficiency 08/28/2016  . Hyperlipidemia 08/28/2016  . Nephrolithiasis 08/12/2016  . Chronic pain syndrome 08/07/2016  . Long term prescription opiate use 08/07/2016  . Opiate use 08/07/2016  . Long term prescription benzodiazepine use 08/07/2016  . Neurogenic pain 08/07/2016  . Chronic low back pain (Primary Area of Pain) (Bilateral) (R>L) (midline) 08/07/2016  . Chronic upper back pain (Secondary area of Pain) (Bilateral) (L>R) 08/07/2016  . Chronic abdominal pain (Right lower quadrant) 08/07/2016  .  Thoracic radiculitis (Right) (T11 dermatome) 08/07/2016  . Chronic occipital neuralgia (Third area of Pain) (Bilateral) (L>R) 08/07/2016  . Chronic neck pain 08/07/2016  . Chronic cervical radicular pain (Bilateral) (L>R) 08/07/2016  . Chronic shoulder blade pain (Bilateral) (L>R) 08/07/2016  . Chronic upper extremity pain (Bilateral) (R>L) 08/07/2016  . Chronic knee pain (Bilateral) (R>L) 08/07/2016  . Chronic ankle pain (Bilateral) 08/07/2016  . Cervical spondylosis with  myelopathy and radiculopathy 08/07/2016  . Panic disorder with agoraphobia 05/29/2016  . Depression, unspecified depression type 05/29/2016  . Atypical lymphocytosis 05/01/2016  . Vitamin D insufficiency 05/01/2016  . Chronic lower extremity cramps (Bilateral) (R>L) 04/29/2016  . Obesity 04/29/2016  . GAD (generalized anxiety disorder) 04/29/2016  . Fatigue 04/29/2016  . Insomnia 07/12/2015  . Migraine without aura and with status migrainosus, not intractable 07/12/2015  . Chronic superficial gastritis 06/02/2015  . Chronic pain of multiple joints 05/15/2015  . Bilateral leg edema 05/02/2015  . Paroxysmal supraventricular tachycardia ([place]) 04/17/2015  . Exertional shortness of breath 04/17/2015  . Bright red rectal bleeding 04/06/2015  . DDD (degenerative disc disease), lumbosacral 01/24/2014  . DDD (degenerative disc disease), lumbar 01/24/2014  . Cervico-occipital neuralgia 12/29/2013  . Fibromyalgia 12/29/2013  . Migraine headache 12/29/2013  . Menorrhagia 12/10/2012  . Depression, major, recurrent, in remission ([place]) 01/12/2009  . Chest pain 01/12/2009  . Hypertension, benign essential, goal below 140/90 06/23/2008  . History of PSVT (paroxysmal supraventricular tachycardia) 06/17/2008  . Obstructive sleep apnea, adult 06/17/2008  . GERD 06/13/2008    Past Medical History:  Diagnosis Date  . Acute postoperative pain 04/07/2017  . Anxiety   . Bursitis   . Chronic fatigue 12/12/2017  . Chronic fatigue syndrome   . Edema leg 05/02/2015  . Fibromyalgia   . GERD (gastroesophageal reflux disease)   . IBS (irritable bowel syndrome)   . Knee pain, bilateral 12/21/2008   Qualifier: Diagnosis of  By: Hassell Done FNP, Tori Milks    . Lumbar discitis   . Migraines   . Osteoarthritis   . Right hand pain 04/10/2015   [facility] has done nerve conduction studies and ruled out carpal tunnel.   . Sleep apnea   . Spinal stenosis   . SVT (supraventricular tachycardia) ([place])   .  Vertigo   . Vitamin D deficiency 05/01/2016    Past Surgical History:  Procedure Laterality Date  . ABLATION     Uterine  . CARDIAC CATHETERIZATION     with ablation  . COLONOSCOPY WITH PROPOFOL N/A 05/17/2015   Procedure: COLONOSCOPY WITH PROPOFOL;  Surgeon: Manya Silvas, MD;  Location: [facility] ENDOSCOPY;  Service: Endoscopy;  Laterality: N/A;  . ESOPHAGOGASTRODUODENOSCOPY N/A 05/17/2015   Procedure: ESOPHAGOGASTRODUODENOSCOPY (EGD);  Surgeon: Manya Silvas, MD;  Location: [facility] ENDOSCOPY;  Service: Endoscopy;  Laterality: N/A;  . KNEE ARTHROSCOPY    . spg     6/18  . Tibial Tubercle Bypass Right 1998  . TUBAL LIGATION  10/01/99    Social History   Tobacco Use  . Smoking status: Former Smoker    Packs/day: 4.00    Years: 3.00    Pack years: 12.00    Types: Cigarettes    Quit date: 12/10/1992    Years since quitting: 26.3  . Smokeless tobacco: Never Used  . Tobacco comment: quit 25 years ago  Substance Use Topics  . Alcohol use: No    Alcohol/week: 0.0 standard drinks     Current Outpatient Medications:  .  acetaminophen (TYLENOL) 500 MG tablet,  Take 500-1,000 mg by mouth as needed., Disp: , Rfl:  .  atorvastatin (LIPITOR) 40 MG tablet, Take 1 tablet (40 mg total) by mouth at bedtime. This replaces crestor (rosuvastatin), Disp: 90 tablet, Rfl: 1 .  baclofen (LIORESAL) 10 MG tablet, Take 1 tablet (10 mg total) by mouth 3 (three) times daily. (Patient taking differently: Take 10 mg by mouth 3 (three) times daily. Uses as needed.), Disp: 90 tablet, Rfl: 2 .  Cholecalciferol (D3-1000 PO), Take 1,000 mg by mouth daily., Disp: , Rfl:  .  Cyanocobalamin (VITAMIN B-12) 500 MCG SUBL, Place 1,500 mcg under the tongue every other day. , Disp: 150 tablet, Rfl:  .  Dexlansoprazole (DEXILANT) 30 MG capsule, Take 1 capsule (30 mg total) by mouth daily. This replaces omeprazole, Disp: 30 capsule, Rfl: 2 .  Diclofenac Sodium 3 % GEL, Place 1 application onto the skin 4 (four) times daily  as needed., Disp: 4 g, Rfl: 2 .  Galcanezumab-gnlm (EMGALITY) 120 MG/ML SOAJ, Inject 120 mg into the skin every 30 (thirty) days., Disp: 1 pen, Rfl: 11 .  magnesium oxide (MAG-OX) 400 MG tablet, Take 300 mg by mouth daily. , Disp: , Rfl:  .  Melatonin 10 MG TABS, Take 10 mg by mouth at bedtime., Disp: , Rfl:  .  metoprolol tartrate (LOPRESSOR) 50 MG tablet, Take 1 tablet (50 mg total) by mouth 2 (two) times daily., Disp: 180 tablet, Rfl: 1 .  ondansetron (ZOFRAN) 4 MG tablet, Take 1 tablet (4 mg total) by mouth every 8 (eight) hours as needed., Disp: 20 tablet, Rfl: 2 .  oxyCODONE (OXY IR/ROXICODONE) 5 MG immediate release tablet, Take 5 mg by mouth every 4 (four) hours as needed for severe pain., Disp: , Rfl:  .  pregabalin (LYRICA) 150 MG capsule, Take 1 capsule (150 mg total) by mouth every 8 (eight) hours., Disp: 270 capsule, Rfl: 0 .  VALERIAN PO, Take by mouth as needed. Makes Valerian tea about 3-4 times per week., Disp: , Rfl:  .  HYDROcodone-acetaminophen (NORCO) 7.5-325 MG tablet, Take 1 tablet by mouth every 6 (six) hours as needed for severe pain., Disp: 30 tablet, Rfl: 0  Allergies  Allergen Reactions  . Aspirin Swelling  . Cymbalta [Duloxetine Hcl] Other (See Comments)    Suicidal ideations and has homicidal thoughts per patient  . Depakote [Divalproex Sodium] Shortness Of Breath    W/ n/v  . Duloxetine Other (See Comments)    Suicidal idealation  . Haloperidol Shortness Of Breath    W/ n/v  . Iodinated Diagnostic Agents Other (See Comments)  . Iodine Shortness Of Breath  . Meperidine Nausea And Vomiting    Patient projectile vomits and usually result in ER  . Reglan [Metoclopramide] Shortness Of Breath    Can't breath, wheezes  . Tramadol Hcl Palpitations    Severely and adversely affects her SVT giving her tachycardias of 150-160 bpm.  . Trazodone Shortness Of Breath  . Compazine [Prochlorperazine] Other (See Comments)    Panic attack  . Meloxicam Other (See Comments)     mouth sores, tingling, blisters in mouth  . Penicillins Rash  . Tomato Hives    Tongue will blister  . Other   . Shellfish Allergy     Other reaction(s): Unknown  . Shellfish-Derived Products Other (See Comments)  . Bacitracin-Neomycin-Polymyxin Rash  . Cephalosporins Rash    rash  . Ibuprofen Other (See Comments) and Rash    Blisters in mouth. Blisters in mouth.  . Latex  Itching  . Neosporin [Neomycin-Bacitracin Zn-Polymyx] Rash  . Nsaids Other (See Comments)    Blisters in mouth; can take 1 ibuprofen 2x a month  . Sulfa Antibiotics Rash    rash Other reaction(s): Unknown rash   . Sulfasalazine Rash  . Sulfonamide Derivatives Rash    ROS   No other specific complaints in a complete review of systems (except as listed in HPI above).  Objective  Vitals:   04/20/19 1445  BP: 104/60  Pulse: 92  Resp: 14  Temp: 97.7 F (36.5 C)  TempSrc: Temporal  SpO2: 96%  Weight: 214 lb 9.6 oz (97.3 kg)  Height: 5\' 3"  (1.6 m)     Body mass index is 38.01 kg/m.  Nursing Note and Vital Signs reviewed.  Physical Exam Constitutional:      Appearance: She is well-developed. She is not diaphoretic.  HENT:     Head: Normocephalic and atraumatic.  Cardiovascular:     Rate and Rhythm: Normal rate and regular rhythm.     Heart sounds: Normal heart sounds.  Pulmonary:     Effort: Pulmonary effort is normal.     Breath sounds: Normal breath sounds.  Musculoskeletal:     Left elbow: She exhibits decreased range of motion. She exhibits no swelling, no effusion, no deformity and no laceration. Tenderness found. Medial epicondyle tenderness noted.  Skin:    General: Skin is warm and dry.     Findings: No erythema.       Neurological:     Mental Status: She is alert and oriented to person, place, and time.     Coordination: Coordination normal.  Psychiatric:        Behavior: Behavior normal.        Thought Content: Thought content normal.        Judgment: Judgment  normal.       No results found for this or any previous visit (from the past 48 hour(s)).  Assessment & Plan 1. Left elbow pain counterforce brace, unable to tolerate NSAIDs will trial steroids. Discussed if not improving-ortho referral. Do to multiple ortho concerns will refer her to get established care - Ambulatory referral to Orthopedic Surgery - predniSONE (STERAPRED UNI-PAK 21 TAB) 10 MG (21) TBPK tablet; Take as directed  Dispense: 21 tablet; Refill: 0  2. Paroxysmal supraventricular tachycardia (HCC) Continue beta blocker, hydration, avoid exacerbating factors  3. Morbid obesity ([place]) Discussed portion control   4. Irritant contact dermatitis due to other agents  - triamcinolone cream (KENALOG) 0.1 %; Apply 1 application topically 2 (two) times daily.  Dispense: 30 g; Refill: 0

## 2019-04-21 ENCOUNTER — Telehealth: Payer: Self-pay

## 2019-04-21 NOTE — Telephone Encounter (Signed)
Copied from [place] [id number]. Topic: Referral - Question >> Apr 21, 2019 11:14 AM Richardo Priest, NT wrote: Reason for CRM: Patient calling in stating referral has to be to a [place] orthopedic MD due to insurance. Call back [phone].

## 2019-04-21 NOTE — Telephone Encounter (Signed)
Copied from [place] [id number]. Topic: Referral - Question >> Apr 21, 2019 11:14 AM Richardo Priest, NT wrote: Reason for CRM: Patient calling in stating referral has to be to a [place] orthopedic MD due to insurance. Call back [phone].

## 2019-04-22 ENCOUNTER — Telehealth: Payer: Self-pay | Admitting: Neurology

## 2019-04-22 NOTE — Telephone Encounter (Signed)
Signed prescription application for Emgality was mailed today.

## 2019-04-26 NOTE — Telephone Encounter (Signed)
Referral has been sent to a [facility] facility as requested.

## 2019-05-03 ENCOUNTER — Telehealth: Payer: Self-pay | Admitting: Neurology

## 2019-05-03 NOTE — Telephone Encounter (Signed)
Called the pt back and advised to try the [facility] for 3-4 months and advised she has the MRI ordered. She verbalized understanding and stated she will keep trying to get that done d/t insurance. I advised to call back with any future questions or concerns. She verbalized appreciation.

## 2019-05-03 NOTE — Telephone Encounter (Signed)
She has to continue taking the [facility] for 3-4 months thanks

## 2019-05-03 NOTE — Telephone Encounter (Signed)
Spoke with Dr. Jaynee Eagles. Pt also has MRI ordered.

## 2019-05-03 NOTE — Telephone Encounter (Signed)
Pt is asking about how long should it take for the Galcanezumab-gnlm (EMGALITY) 120 MG/ML SOAJ to work.  Pt states its been about a month and she is still having terrible migraines. Please call

## 2019-05-03 NOTE — Telephone Encounter (Signed)
Spoke with pt and advised it can take 5-6 months to get the maximum effect. She stated she is coming off an 11 hr/day 4 day migraine. Friday she noticed intermittent vision changes like "[place]" where things are "growing and shrinking" and coming forward. She also reported intermittent Bil shin and feet pins and needles. She said she is in contact with another doctor about the sensation disturbances. She has had numbness in her L arm with migraines in the past. I advised I would send the message to Dr. Jaynee Eagles. She verbalized appreciation. She understands if she were to develop sudden unilateral numbness to call 911.

## 2019-05-13 ENCOUNTER — Telehealth: Payer: Self-pay | Admitting: *Deleted

## 2019-05-13 NOTE — Telephone Encounter (Signed)
Attempted to call for pre appointment assessment. Message left on both numbers listed.

## 2019-05-16 NOTE — Progress Notes (Signed)
Pain Management Virtual Encounter Note - Virtual Visit via Telephone Telehealth (real-time audio visits between healthcare provider and patient).   Patient's Phone No. & Preferred Pharmacy:  [phone] (home); [phone] (mobile); (Preferred) [phone] [email or web address]  [facility] [place], [address] [place] [address] Phone: [phone] Fax: [phone]  [place], [address] [place] [address] Phone: [phone] Fax: [place], [address] [place] [address] Phone: [phone] Fax: [phone]    Pre-screening note:  Our staff contacted Dana Bradley and offered her an "in person", "face-to-face" appointment versus a telephone encounter. She indicated preferring the telephone encounter, at this time.   Reason for Virtual Visit: COVID-19*  Social distancing based on CDC and AMA recommendations.   I contacted Dana Bradley on 05/17/2019 via telephone.      I clearly identified myself as Gaspar Cola, MD. I verified that I was speaking with the correct person using two identifiers (Name: Dana Bradley, and date of birth: 1971/06/24).  Advanced Informed Consent I sought verbal advanced consent from Dana Bradley for virtual visit interactions. I informed Dana Bradley of possible security and privacy concerns, risks, and limitations associated with providing "not-in-person" medical evaluation and management services. I also informed Dana Bradley of the availability of "in-person" appointments. Finally, I informed her that there would be a charge for the virtual visit and that she could be  personally, fully or partially, financially responsible for it. Dana Bradley expressed understanding and agreed to proceed.   Historic Elements   Dana Bradley is a 48  y.o. year old, female patient evaluated today after her last encounter by our practice on 05/13/2019. Dana Bradley  has a past medical history of Acute postoperative pain (04/07/2017), Anxiety, Bursitis, Chronic fatigue (12/12/2017), Chronic fatigue syndrome, Edema leg (05/02/2015), Fibromyalgia, GERD (gastroesophageal reflux disease), IBS (irritable bowel syndrome), Knee pain, bilateral (12/21/2008), Lumbar discitis, Migraines, Osteoarthritis, Right hand pain (04/10/2015), Sleep apnea, Spinal stenosis, SVT (supraventricular tachycardia) ([place]), Vertigo, and Vitamin D deficiency (05/01/2016). She also  has a past surgical history that includes Ablation; Tubal ligation (10/01/99); Cardiac catheterization; Knee arthroscopy; Colonoscopy with propofol (N/A, 05/17/2015); Esophagogastroduodenoscopy (N/A, 05/17/2015); spg; and Tibial Tubercle Bypass (Right, 1998). Dana Bradley has a current medication list which includes the following prescription(s): acetaminophen, atorvastatin, baclofen, cholecalciferol, vitamin b-12, dexlansoprazole, diclofenac sodium, emgality, magnesium oxide, melatonin, metoprolol tartrate, ondansetron, oxycodone, pregabalin, triamcinolone cream, valerian, and hydrocodone-acetaminophen. She  reports that she quit smoking about 26 years ago. Her smoking use included cigarettes. She has a 12.00 pack-year smoking history. She has never used smokeless tobacco. She reports that she does not drink alcohol or use drugs. Dana Bradley is allergic to aspirin; cymbalta [duloxetine hcl]; depakote [divalproex sodium]; duloxetine; haloperidol; iodinated diagnostic agents; iodine; meperidine; reglan [metoclopramide]; tramadol hcl; trazodone; compazine [prochlorperazine]; meloxicam; penicillins; tomato; other; shellfish allergy; shellfish-derived products; bacitracin-neomycin-polymyxin; cephalosporins; ibuprofen; latex; neosporin [neomycin-bacitracin zn-polymyx]; nsaids; sulfa antibiotics; sulfasalazine; and sulfonamide derivatives.    HPI  Today, she is being contacted for medication management.  Pending MRI.  Pharmacotherapy Assessment  Analgesic: Hydrocodone/APAP 7.5/325, 1 tab PO QD (7.5 mg/day of hydrocodone)  MME/day:7.5mg /day.   Monitoring: Pharmacotherapy: No side-effects or adverse reactions reported. [place] PMP: PDMP reviewed during this encounter.       Compliance: No problems identified. Effectiveness: Clinically acceptable. Plan: Refer to "POC".  UDS:  Summary  Date Value Ref Range Status  03/05/2018 FINAL  Final    Comment:    ==================================================================== TOXASSURE SELECT 13 (MW) ==================================================================== Test                             Result       Flag       Units Drug Present   Hydrocodone                    422                     ng/mg creat   Hydromorphone                  105                     ng/mg creat   Norhydrocodone                 312                     ng/mg creat    Sources of hydrocodone include scheduled prescription    medications. Hydromorphone and norhydrocodone are expected    metabolites of hydrocodone. Hydromorphone is also available as a    scheduled prescription medication. ==================================================================== Test                      Result    Flag   Units      Ref Range   Creatinine              148              mg/dL      >=20 ==================================================================== Declared Medications:  Medication list was not provided. ==================================================================== For clinical consultation, please call [phone]. ====================================================================    Laboratory Chemistry Profile (12 mo)  Renal: No results found for requested labs within last 8760 hours.  Lab Results  Component Value Date   GFRAA 102 11/24/2017   GFRNONAA 88 11/24/2017   Hepatic:  No results found for requested labs within last 8760 hours. Lab Results  Component Value Date   AST 14 11/24/2017   ALT 21 11/24/2017   Other: 07/30/2018: CRP 6.0; Sed Rate 17 Note: Above Lab results reviewed.  Imaging  Last 90 days:  No results found. Last Hospital Admission:  Nm Myocar Multi W/spect W/wall Motion / Ef  Result Date: 01/02/2018  Blood pressure demonstrated a normal response to exercise.  There was no ST segment deviation noted during stress.  No T wave inversion was noted during stress.  The study is normal.  This is a low risk study.  The left ventricular ejection fraction is normal (55-65%).    Assessment  The primary encounter diagnosis was Chronic pain syndrome. Diagnoses of Chronic low back pain (Primary Area of Pain) (Bilateral) (R>L) (midline), Chronic upper back pain (Secondary area of Pain) (Bilateral) (L>R), Chronic occipital neuralgia (Third area of Pain) (Bilateral) (L>R), and Fibromyalgia were also pertinent to this visit.  Plan of Care  I have discontinued Jamal Koury's predniSONE. I have also changed her HYDROcodone-acetaminophen. Additionally, I am having her maintain her Vitamin B-12, magnesium oxide, oxyCODONE, ondansetron, Dexlansoprazole, Cholecalciferol (D3-1000 PO), atorvastatin, metoprolol tartrate, acetaminophen, Melatonin, baclofen, Diclofenac Sodium, VALERIAN PO, Emgality, triamcinolone cream, and pregabalin.  Pharmacotherapy (Medications Ordered): Meds ordered this encounter  Medications  .  HYDROcodone-acetaminophen (NORCO) 7.5-325 MG tablet    Sig: Take 1 tablet by mouth every 6 (six) hours as needed for severe pain. Must last 90 days    Dispense:  30 tablet    Refill:  0    Chronic Pain: STOP Act (Not applicable) Fill 1 day early if closed on refill date. Do not fill until: 05/17/2019. To last until: 08/15/2019. Avoid benzodiazepines within 8 hours of opioids  . pregabalin (LYRICA) 150 MG capsule    Sig: Take 1 capsule (150 mg total) by  mouth every 8 (eight) hours.    Dispense:  270 capsule    Refill:  1    Fill one day early if pharmacy is closed on scheduled refill date. May substitute for generic if available.   Orders:  No orders of the defined types were placed in this encounter.  Follow-up plan:   Return in about 3 months (around 08/17/2019) for (VV), E/M (MM), in addition, review of MRI results as soon as completed.      Considering: Diagnostic bilateral lumbar facet block #2 Possible bilateral lumbar facet RFA. Diagnostic thoracic facet block Possible bilateral thoracic facet RFA. Diagnostic bilateral cervical facet blocks Possible bilateral cervical facet RFA. Diagnostic bilateral lesser occipital nerve blocks Diagnostic bilateral C2 and TON nerve block Possible bilateral occipital nerve RFA. Diagnostic right T10-11 TESI Diagnostic bilateral suprascapular nerve block Diagnostic bilateral intra-articular knee joint injection Possible bilateral intra-articular Hyalgan knee injections.  Diagnostic bilateral Genicular nerve block Possible bilateral Genicular nerve RFA.   Palliative PRN treatment(s): Palliative left trapezius muscle MNB Palliative left L4-5 LESI PalliativeLeft CESI     Recent Visits Date Type Provider Dept  04/05/19 Office Visit Milinda Pointer, MD [facility]  02/16/19 Office Visit Vevelyn Francois, NP [facility]  Showing recent visits within past 90 days and meeting all other requirements   Today's Visits Date Type Provider Dept  05/17/19 Office Visit Milinda Pointer, MD [facility]  Showing today's visits and meeting all other requirements   Future Appointments No visits were found meeting these conditions.  Showing future appointments within next 90 days and meeting all other requirements   I discussed the assessment and treatment plan with the patient. The patient was provided an opportunity to ask questions and all were  answered. The patient agreed with the plan and demonstrated an understanding of the instructions.  Patient advised to call back or seek an in-person evaluation if the symptoms or condition worsens.  Total duration of non-face-to-face encounter: 12 minutes.  Note by: Gaspar Cola, MD Date: 05/17/2019; Time: 10:37 AM  Note: This dictation was prepared with Dragon dictation. Any transcriptional errors that may result from this process are unintentional.  Disclaimer:  * Given the special circumstances of the COVID-19 pandemic, the federal government has announced that the Office for Civil Rights (OCR) will exercise its enforcement discretion and will not impose penalties on physicians using telehealth in the event of noncompliance with regulatory requirements under the [place] (HIPAA) in connection with the good faith provision of telehealth during the OYDXA-12 national public health emergency. ([place])

## 2019-05-17 ENCOUNTER — Other Ambulatory Visit: Payer: Self-pay

## 2019-05-17 ENCOUNTER — Ambulatory Visit: Payer: Medicaid Other | Attending: Pain Medicine | Admitting: Pain Medicine

## 2019-05-17 DIAGNOSIS — M797 Fibromyalgia: Secondary | ICD-10-CM

## 2019-05-17 DIAGNOSIS — G8929 Other chronic pain: Secondary | ICD-10-CM

## 2019-05-17 DIAGNOSIS — M549 Dorsalgia, unspecified: Secondary | ICD-10-CM | POA: Diagnosis not present

## 2019-05-17 DIAGNOSIS — M5442 Lumbago with sciatica, left side: Secondary | ICD-10-CM | POA: Diagnosis not present

## 2019-05-17 DIAGNOSIS — M5481 Occipital neuralgia: Secondary | ICD-10-CM | POA: Diagnosis not present

## 2019-05-17 DIAGNOSIS — M5441 Lumbago with sciatica, right side: Secondary | ICD-10-CM

## 2019-05-17 DIAGNOSIS — G894 Chronic pain syndrome: Secondary | ICD-10-CM

## 2019-05-17 MED ORDER — HYDROCODONE-ACETAMINOPHEN 7.5-325 MG PO TABS: 1.0000 | ORAL_TABLET | Freq: Four times a day (QID) | ORAL | 0 refills | Status: DC | PRN

## 2019-05-17 MED ORDER — PREGABALIN 150 MG PO CAPS: 150.0000 mg | ORAL_CAPSULE | Freq: Three times a day (TID) | ORAL | 1 refills | Status: DC

## 2019-05-27 ENCOUNTER — Telehealth: Payer: Self-pay | Admitting: Pharmacist

## 2019-05-27 NOTE — Telephone Encounter (Signed)
05/27/2019 3:29:34 PM - Dexilant pending  05/27/2019 I have received provider portion of [employer] application for [place], holding for patient to return her portion--mailed to patient 05/19/2019.Dana Bradley

## 2019-06-21 ENCOUNTER — Encounter: Payer: Self-pay | Admitting: Physical Medicine & Rehabilitation

## 2019-06-21 ENCOUNTER — Telehealth: Payer: Self-pay | Admitting: Neurology

## 2019-06-21 NOTE — Telephone Encounter (Signed)
Patient got her cone assistance in and it expires on 11/17/19. She is ready to schedule her MRI's now at [facility] on a Friday.   Patient also informed me that she has had a reaction to contrast back in 2003. Would you be okay with switching the MRI Brain to wo contrast?? Also, could you put new orders in for [place]. Thank you!

## 2019-06-22 NOTE — Addendum Note (Signed)
Addended by: Sarina Ill B on: 06/22/2019 12:43 PM   Modules accepted: Orders

## 2019-06-23 NOTE — Telephone Encounter (Signed)
done

## 2019-06-23 NOTE — Telephone Encounter (Signed)
Can you put a new order in for MRI Brain wo contrast. Thank you!

## 2019-06-23 NOTE — Addendum Note (Signed)
Addended by: Sarina Ill B on: 06/23/2019 12:53 PM   Modules accepted: Orders

## 2019-06-23 NOTE — Telephone Encounter (Signed)
The patient has informed me that she has an reaction to the contrast in 2003. Would you be okay with switching the MRI Brain to wo contrast?

## 2019-06-23 NOTE — Addendum Note (Signed)
Addended by: Sarina Ill B on: 06/23/2019 06:23 PM   Modules accepted: Orders

## 2019-06-23 NOTE — Telephone Encounter (Signed)
Done. thanks

## 2019-06-24 NOTE — Telephone Encounter (Signed)
Patient is scheduled at [facility] for 07/09/19 arrival time 2:30 pm. Patient is aware of time and day. I also gave her their number of [phone] incase she needed to r/s for any reason.

## 2019-07-01 ENCOUNTER — Telehealth: Payer: Self-pay | Admitting: *Deleted

## 2019-07-05 ENCOUNTER — Other Ambulatory Visit: Payer: Self-pay

## 2019-07-05 ENCOUNTER — Ambulatory Visit
Admission: RE | Admit: 2019-07-05 | Discharge: 2019-07-05 | Disposition: A | Discharge status: Home / Self Care | Payer: Self-pay | Source: Ambulatory Visit | Attending: Pain Medicine | Admitting: Pain Medicine

## 2019-07-05 DIAGNOSIS — G8929 Other chronic pain: Secondary | ICD-10-CM | POA: Insufficient documentation

## 2019-07-05 DIAGNOSIS — R29898 Other symptoms and signs involving the musculoskeletal system: Secondary | ICD-10-CM | POA: Insufficient documentation

## 2019-07-05 DIAGNOSIS — M5441 Lumbago with sciatica, right side: Secondary | ICD-10-CM | POA: Insufficient documentation

## 2019-07-05 DIAGNOSIS — M5137 Other intervertebral disc degeneration, lumbosacral region: Secondary | ICD-10-CM | POA: Insufficient documentation

## 2019-07-05 DIAGNOSIS — M5136 Other intervertebral disc degeneration, lumbar region: Secondary | ICD-10-CM | POA: Insufficient documentation

## 2019-07-05 DIAGNOSIS — R262 Difficulty in walking, not elsewhere classified: Secondary | ICD-10-CM | POA: Insufficient documentation

## 2019-07-05 DIAGNOSIS — M5416 Radiculopathy, lumbar region: Secondary | ICD-10-CM | POA: Insufficient documentation

## 2019-07-05 DIAGNOSIS — M5442 Lumbago with sciatica, left side: Secondary | ICD-10-CM | POA: Insufficient documentation

## 2019-07-09 ENCOUNTER — Ambulatory Visit (INDEPENDENT_AMBULATORY_CARE_PROVIDER_SITE_OTHER): Payer: Self-pay

## 2019-07-09 ENCOUNTER — Encounter: Payer: Self-pay | Admitting: Orthopaedic Surgery

## 2019-07-09 ENCOUNTER — Ambulatory Visit
Admission: RE | Admit: 2019-07-09 | Discharge: 2019-07-09 | Disposition: A | Discharge status: Home / Self Care | Payer: Self-pay | Source: Ambulatory Visit | Attending: Neurology | Admitting: Neurology

## 2019-07-09 ENCOUNTER — Other Ambulatory Visit: Payer: Self-pay

## 2019-07-09 ENCOUNTER — Ambulatory Visit
Admission: RE | Admit: 2019-07-09 | Discharge: 2019-07-09 | Disposition: A | Discharge status: Home / Self Care | Payer: Medicaid Other | Source: Ambulatory Visit | Attending: Neurology | Admitting: Neurology

## 2019-07-09 ENCOUNTER — Ambulatory Visit (INDEPENDENT_AMBULATORY_CARE_PROVIDER_SITE_OTHER): Payer: Self-pay | Admitting: Orthopaedic Surgery

## 2019-07-09 DIAGNOSIS — W19XXXD Unspecified fall, subsequent encounter: Secondary | ICD-10-CM | POA: Insufficient documentation

## 2019-07-09 DIAGNOSIS — H814 Vertigo of central origin: Secondary | ICD-10-CM | POA: Insufficient documentation

## 2019-07-09 DIAGNOSIS — G441 Vascular headache, not elsewhere classified: Secondary | ICD-10-CM | POA: Insufficient documentation

## 2019-07-09 DIAGNOSIS — R270 Ataxia, unspecified: Secondary | ICD-10-CM | POA: Insufficient documentation

## 2019-07-09 DIAGNOSIS — M25522 Pain in left elbow: Secondary | ICD-10-CM

## 2019-07-09 DIAGNOSIS — M542 Cervicalgia: Secondary | ICD-10-CM

## 2019-07-09 DIAGNOSIS — M47812 Spondylosis without myelopathy or radiculopathy, cervical region: Secondary | ICD-10-CM | POA: Insufficient documentation

## 2019-07-09 DIAGNOSIS — M5481 Occipital neuralgia: Secondary | ICD-10-CM

## 2019-07-09 DIAGNOSIS — R29898 Other symptoms and signs involving the musculoskeletal system: Secondary | ICD-10-CM | POA: Insufficient documentation

## 2019-07-09 DIAGNOSIS — R443 Hallucinations, unspecified: Secondary | ICD-10-CM | POA: Insufficient documentation

## 2019-07-09 DIAGNOSIS — M50222 Other cervical disc displacement at C5-C6 level: Secondary | ICD-10-CM | POA: Insufficient documentation

## 2019-07-09 MED ORDER — PREDNISONE 10 MG (21) PO TBPK: ORAL_TABLET | ORAL | 0 refills | Status: DC

## 2019-07-09 NOTE — Progress Notes (Signed)
Office Visit Note   Patient: Dana Bradley           Date of Birth: [birth year]           MRN: [id number] Visit Date: 07/09/2019              Requested by: Hubbard Hartshorn, [address] [place],  [place] [address] PCP: Hubbard Hartshorn, FNP   Assessment & Plan: Visit Diagnoses:  1. Neck pain   2. Pain in left elbow     Plan: Impression is cervical spine radiculopathy and medial epicondylitis.  We will start the patient on a Sterapred taper and start her in physical therapy.  We discussed possible cortisone injection into the medial epicondyle should her pain persist.  Also discussed with her that she does not need to wear the tennis elbow strap.  She will follow-up with [place] as needed.  Call with concerns or questions.  Follow-Up Instructions: Return if symptoms worsen or fail to improve.   Orders:  Orders Placed This Encounter  Procedures  . XR Cervical Spine 2 or 3 views  . XR Elbow 2 Views Left  . Ambulatory referral to Physical Therapy   Meds ordered this encounter  Medications  . predniSONE (STERAPRED UNI-PAK 21 TAB) 10 MG (21) TBPK tablet    Sig: Take as directed    Dispense:  21 tablet    Refill:  0      Procedures: No procedures performed   Clinical Data: No additional findings.   Subjective: Chief Complaint  Patient presents with  . Neck - Pain  . Left Elbow - Pain  . Right Hand - Pain, Numbness  . Left Hand - Pain, Numbness    HPI patient is a 48 year old female who presents to our clinic today with left-sided neck and left elbow pain.  She has had pain to both for several months.  The pain she has to the left elbow is to the medial aspect.  No specific injury.  She notes that she has increased pain with any lifting.  She was seen by her PCP at [facility] where she was told she had tennis elbow and was given a tennis elbow strap.  She has not noticed any difference wearing the strap.  She is also complaining of lateral neck pain that also  goes down her entire arm and into her hand.  She has associated numbness and tingling to the left ring and small fingers.  She notes that she is had a C-spine epidural steroid injection in 2018 with relief of symptoms.  She also notes that she had a nerve conduction study in 2018 which showed carpal tunnel syndrome.  No ulnar nerve entrapment at that point.  Review of Systems as detailed in HPI.  All others reviewed and are negative.   Objective: Vital Signs: LMP 06/16/2019   Physical Exam well-developed and well-nourished female no acute distress.  Alert and oriented x3.  Ortho Exam examination of her neck reveals.  Full range of motion with flexion as well as rotation to the left.  No spinous or paraspinous tenderness.  She has a normal left shoulder exam.  Examination of her left elbow reveals marked tenderness over the medial epicondyle.  Negative Tinel.  She has increased pain with elbow pronation and wrist flexion.  She has full sensation distally.  Specialty Comments:  No specialty comments available.  Imaging: Xr Elbow 2 Views Left  Result Date: 07/09/2019 No acute or  structural abnormalities  Xr Cervical Spine 2 Or 3 Views  Result Date: 07/09/2019 No acute or structural abnormalities    PMFS History: Patient Active Problem List   Diagnosis Date Noted  . Migraine with aura and with status migrainosus, not intractable 04/06/2019  . Cervico-occipital neuralgia of left side 04/06/2019  . Weakness of leg (Left) 04/05/2019  . Difficulty walking 04/05/2019  . Chronic migraine without aura, with intractable migraine, so stated, with status migrainosus 12/27/2018  . Malar rash 09/04/2018  . Trigger point of neck (Left) 03/19/2018  . Occipital headache 12/25/2017  . Chronic fatigue syndrome with fibromyalgia 12/12/2017  . Trigger point of shoulder region (Left) 11/17/2017  . Myofascial pain syndrome (Left) (trapezius muscle) 07/22/2017  . Lumbar L1-2 disc protrusion (Right)  04/07/2017  . Muscle spasticity 04/01/2017  . 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 Lumbar spondylosis 01/06/2017  . Chronic left hip pain 12/24/2016  . Chronic sacroiliac joint pain (Left) 12/24/2016  . Lumbar facet joint syndrome (B) (L>R) 12/24/2016  . Lumbar radiculitis (Left) 12/24/2016  . Hypertriglyceridemia 11/27/2016  . History of vasovagal episode 10/30/2016  . Cervicogenic headache 09/09/2016  . Medication monitoring encounter 08/29/2016  . Controlled substance agreement signed 08/28/2016  . Plantar fasciitis of left foot 08/28/2016  . Vitamin B12 deficiency 08/28/2016  . Hyperlipidemia 08/28/2016  . Nephrolithiasis 08/12/2016  . Chronic pain syndrome 08/07/2016  . Long term prescription opiate use 08/07/2016  . Opiate use 08/07/2016  . Long term prescription benzodiazepine use 08/07/2016  . Neurogenic pain 08/07/2016  . Chronic low back pain (Primary Area of Pain) (Bilateral) (R>L) (midline) 08/07/2016  . Chronic upper back pain (Secondary area of Pain) (Bilateral) (L>R) 08/07/2016  . Chronic abdominal pain (Right lower quadrant) 08/07/2016  . Thoracic radiculitis (Right) (T11 dermatome) 08/07/2016  . Chronic occipital neuralgia (Third area of Pain) (Bilateral) (L>R) 08/07/2016  . Chronic neck pain 08/07/2016  . Chronic cervical radicular pain (Bilateral) (L>R) 08/07/2016  . Chronic shoulder blade pain (Bilateral) (L>R) 08/07/2016  . Chronic upper extremity pain (Bilateral) (R>L) 08/07/2016  . Chronic knee pain (Bilateral) (R>L) 08/07/2016  . Chronic ankle pain (Bilateral) 08/07/2016  . Cervical spondylosis with myelopathy and radiculopathy 08/07/2016  . Panic disorder with agoraphobia 05/29/2016  . Depression, unspecified depression type 05/29/2016  . Atypical lymphocytosis 05/01/2016  . Vitamin D insufficiency 05/01/2016  . Chronic lower extremity cramps (Bilateral) (R>L) 04/29/2016  . Obesity 04/29/2016  . GAD (generalized anxiety disorder) 04/29/2016  .  Fatigue 04/29/2016  . Insomnia 07/12/2015  . Migraine without aura and with status migrainosus, not intractable 07/12/2015  . Chronic superficial gastritis 06/02/2015  . Chronic pain of multiple joints 05/15/2015  . Bilateral leg edema 05/02/2015  . Paroxysmal supraventricular tachycardia ([place]) 04/17/2015  . Exertional shortness of breath 04/17/2015  . Bright red rectal bleeding 04/06/2015  . DDD (degenerative disc disease), lumbosacral 01/24/2014  . DDD (degenerative disc disease), lumbar 01/24/2014  . Cervico-occipital neuralgia 12/29/2013  . Fibromyalgia 12/29/2013  . Migraine headache 12/29/2013  . Menorrhagia 12/10/2012  . Depression, major, recurrent, in remission ([place]) 01/12/2009  . Chest pain 01/12/2009  . Hypertension, benign essential, goal below 140/90 06/23/2008  . History of PSVT (paroxysmal supraventricular tachycardia) 06/17/2008  . Obstructive sleep apnea, adult 06/17/2008  . GERD 06/13/2008   Past Medical History:  Diagnosis Date  . Acute postoperative pain 04/07/2017  . Anxiety   . Bursitis   . Chronic fatigue 12/12/2017  . Chronic fatigue syndrome   . Edema leg 05/02/2015  .  Fibromyalgia   . GERD (gastroesophageal reflux disease)   . IBS (irritable bowel syndrome)   . Knee pain, bilateral 12/21/2008   Qualifier: Diagnosis of  By: Hassell Done FNP, Tori Milks    . Lumbar discitis   . Migraines   . Osteoarthritis   . Right hand pain 04/10/2015   [facility] has done nerve conduction studies and ruled out carpal tunnel.   . Sleep apnea   . Spinal stenosis   . SVT (supraventricular tachycardia) ([place])   . Vertigo   . Vitamin D deficiency 05/01/2016    Family History  Problem Relation Age of Onset  . Depression Mother   . Hypertension Mother   . Cancer Mother        Skin  . Hyperlipidemia Mother   . Anxiety disorder Mother   . Migraines Mother   . Alcohol abuse Father   . Depression Father   . Stroke Father   . Heart disease Father   . Hypertension  Father   . Anxiety disorder Father   . Depression Sister   . Hyperlipidemia Sister   . Diabetes Sister   . Hypertension Sister   . Polycystic ovary syndrome Sister   . Bipolar disorder Sister   . Anxiety disorder Sister   . Migraines Sister   . Cancer Maternal Grandmother 74       Breast  . Thyroid disease Maternal Grandmother   . Arthritis Maternal Grandmother   . Hyperlipidemia Maternal Grandmother   . Depression Sister   . Hypertension Sister   . Anxiety disorder Sister   . Migraines Sister   . Alzheimer's disease Other   . Aneurysm Maternal Grandfather   . Hypertension Maternal Grandfather   . Heart disease Maternal Grandfather   . Alzheimer's disease Paternal Grandmother   . Heart attack Paternal Grandfather   . Hypertension Paternal Grandfather   . COPD Paternal Grandfather   . Heart disease Paternal Grandfather   . Migraines Son   . Migraines Daughter   . Migraines Daughter   . Bladder Cancer Neg Hx   . Kidney cancer Neg Hx     Past Surgical History:  Procedure Laterality Date  . ABLATION     Uterine  . CARDIAC CATHETERIZATION     with ablation  . COLONOSCOPY WITH PROPOFOL N/A 05/17/2015   Procedure: COLONOSCOPY WITH PROPOFOL;  Surgeon: Manya Silvas, MD;  Location: [facility] ENDOSCOPY;  Service: Endoscopy;  Laterality: N/A;  . ESOPHAGOGASTRODUODENOSCOPY N/A 05/17/2015   Procedure: ESOPHAGOGASTRODUODENOSCOPY (EGD);  Surgeon: Manya Silvas, MD;  Location: [facility] ENDOSCOPY;  Service: Endoscopy;  Laterality: N/A;  . KNEE ARTHROSCOPY    . spg     6/18  . Tibial Tubercle Bypass Right 1998  . TUBAL LIGATION  10/01/99   Social History   Occupational History  . Occupation: disbled    Comment: not able  Tobacco Use  . Smoking status: Former Smoker    Packs/day: 4.00    Years: 3.00    Pack years: 12.00    Types: Cigarettes    Quit date: 12/10/1992    Years since quitting: 26.5  . Smokeless tobacco: Never Used  . Tobacco comment: quit 25 years ago  Substance  and Sexual Activity  . Alcohol use: No    Alcohol/week: 0.0 standard drinks  . Drug use: No  . Sexual activity: Not Currently    Partners: Male    Birth control/protection: Surgical

## 2019-07-15 ENCOUNTER — Telehealth: Payer: Self-pay | Admitting: Pain Medicine

## 2019-07-15 NOTE — Telephone Encounter (Signed)
Patient is scheduled 07-26-19 to talk w/ Dr. Dossie Arbour about her MRI results and options of treatment. She asks if he can look at the MRI and if he can do a procedure she would rather do that.

## 2019-07-15 NOTE — Telephone Encounter (Signed)
Spoke with patient about her request to combine appts.  She is having a lot of pain and would like to have something ASAP.  I told her that typically going over imaging results would require an appt and then schedule procedures. I told her that we could check to see if there is anything sooner for VV and then possibly we could see her sooner for procedure if indicated.  Patient is agreeable to this.

## 2019-07-16 NOTE — Telephone Encounter (Signed)
Moved appt to Wed 07-21-19 at 10:00 Virtual Visit

## 2019-07-20 ENCOUNTER — Encounter: Payer: Self-pay | Admitting: Pain Medicine

## 2019-07-21 ENCOUNTER — Other Ambulatory Visit: Payer: Self-pay

## 2019-07-21 ENCOUNTER — Ambulatory Visit: Payer: Medicaid Other | Attending: Pain Medicine | Admitting: Pain Medicine

## 2019-07-21 ENCOUNTER — Telehealth: Payer: Self-pay | Admitting: *Deleted

## 2019-07-21 DIAGNOSIS — M1612 Unilateral primary osteoarthritis, left hip: Secondary | ICD-10-CM

## 2019-07-21 DIAGNOSIS — M5137 Other intervertebral disc degeneration, lumbosacral region: Secondary | ICD-10-CM

## 2019-07-21 DIAGNOSIS — M51379 Other intervertebral disc degeneration, lumbosacral region without mention of lumbar back pain or lower extremity pain: Secondary | ICD-10-CM

## 2019-07-21 DIAGNOSIS — G894 Chronic pain syndrome: Secondary | ICD-10-CM

## 2019-07-21 DIAGNOSIS — G4486 Cervicogenic headache: Secondary | ICD-10-CM

## 2019-07-21 DIAGNOSIS — M5030 Other cervical disc degeneration, unspecified cervical region: Secondary | ICD-10-CM | POA: Insufficient documentation

## 2019-07-21 DIAGNOSIS — M25551 Pain in right hip: Secondary | ICD-10-CM

## 2019-07-21 DIAGNOSIS — M5414 Radiculopathy, thoracic region: Secondary | ICD-10-CM

## 2019-07-21 DIAGNOSIS — M5442 Lumbago with sciatica, left side: Secondary | ICD-10-CM

## 2019-07-21 DIAGNOSIS — M549 Dorsalgia, unspecified: Secondary | ICD-10-CM

## 2019-07-21 DIAGNOSIS — M5412 Radiculopathy, cervical region: Secondary | ICD-10-CM

## 2019-07-21 DIAGNOSIS — M5441 Lumbago with sciatica, right side: Secondary | ICD-10-CM

## 2019-07-21 DIAGNOSIS — M47812 Spondylosis without myelopathy or radiculopathy, cervical region: Secondary | ICD-10-CM

## 2019-07-21 DIAGNOSIS — R1031 Right lower quadrant pain: Secondary | ICD-10-CM

## 2019-07-21 DIAGNOSIS — M25552 Pain in left hip: Secondary | ICD-10-CM

## 2019-07-21 DIAGNOSIS — M533 Sacrococcygeal disorders, not elsewhere classified: Secondary | ICD-10-CM

## 2019-07-21 DIAGNOSIS — R519 Headache, unspecified: Secondary | ICD-10-CM

## 2019-07-21 DIAGNOSIS — G8929 Other chronic pain: Secondary | ICD-10-CM

## 2019-07-21 DIAGNOSIS — M9904 Segmental and somatic dysfunction of sacral region: Secondary | ICD-10-CM

## 2019-07-21 DIAGNOSIS — M4722 Other spondylosis with radiculopathy, cervical region: Secondary | ICD-10-CM

## 2019-07-21 DIAGNOSIS — R1032 Left lower quadrant pain: Secondary | ICD-10-CM

## 2019-07-21 DIAGNOSIS — M5134 Other intervertebral disc degeneration, thoracic region: Secondary | ICD-10-CM

## 2019-07-21 DIAGNOSIS — M5481 Occipital neuralgia: Secondary | ICD-10-CM

## 2019-07-21 DIAGNOSIS — M4712 Other spondylosis with myelopathy, cervical region: Secondary | ICD-10-CM

## 2019-07-21 MED ORDER — HYDROCODONE-ACETAMINOPHEN 7.5-325 MG PO TABS: 1.0000 | ORAL_TABLET | Freq: Four times a day (QID) | ORAL | 0 refills | Status: DC | PRN

## 2019-07-21 NOTE — Patient Instructions (Signed)

## 2019-07-21 NOTE — Progress Notes (Signed)
Pain Management Virtual Encounter Note - Virtual Visit via Telephone Telehealth (real-time audio visits between healthcare provider and patient).   Patient's Phone No. & Preferred Pharmacy:  [phone] (home); [phone] (mobile); (Preferred) [phone] [email or web address]  [place] [place] [address] [place] [address] Phone: [phone] Fax: [phone]    Pre-screening note:  Our staff contacted Dana Bradley and offered her an "in person", "face-to-face" appointment versus a telephone encounter. She indicated preferring the telephone encounter, at this time.   Reason for Virtual Visit: COVID-19*  Social distancing based on CDC and AMA recommendations.   I contacted Dana Bradley on 07/21/2019 via telephone.      I clearly identified myself as Gaspar Cola, MD. I verified that I was speaking with the correct person using two identifiers (Name: Dana Bradley, and date of birth: [birth year]).  Advanced Informed Consent I sought verbal advanced consent from Dana Bradley for virtual visit interactions. I informed Dana Bradley of possible security and privacy concerns, risks, and limitations associated with providing "not-in-person" medical evaluation and management services. I also informed Dana Bradley of the availability of "in-person" appointments. Finally, I informed her that there would be a charge for the virtual visit and that she could be  personally, fully or partially, financially responsible for it. Dana Bradley expressed understanding and agreed to proceed.   Historic Elements   Dana Bradley is a 48 y.o. year old, female patient evaluated today after her last encounter by our practice on 07/15/2019. Dana Bradley  has a past medical history of Acute postoperative pain (04/07/2017), Anxiety, Bursitis, Chronic fatigue (12/12/2017), Chronic fatigue syndrome, Edema leg (05/02/2015), Fibromyalgia, GERD (gastroesophageal reflux disease), IBS  (irritable bowel syndrome), Knee pain, bilateral (12/21/2008), Lumbar discitis, Migraines, Osteoarthritis, Right hand pain (04/10/2015), Sleep apnea, Spinal stenosis, SVT (supraventricular tachycardia) ([place]), Vertigo, and Vitamin D deficiency (05/01/2016). She also  has a past surgical history that includes Ablation; Tubal ligation (10/01/99); Cardiac catheterization; Knee arthroscopy; Colonoscopy with propofol (N/A, 05/17/2015); Esophagogastroduodenoscopy (N/A, 05/17/2015); spg; and Tibial Tubercle Bypass (Right, 1998). Dana Bradley has a current medication list which includes the following prescription(s): acetaminophen, atorvastatin, cholecalciferol, vitamin b-12, dexlansoprazole, emgality, hydrocodone-acetaminophen, magnesium oxide, melatonin, metoprolol tartrate, ondansetron, prednisone, pregabalin, triamcinolone cream, valerian, and baclofen. She  reports that she quit smoking about 26 years ago. Her smoking use included cigarettes. She has a 12.00 pack-year smoking history. She has never used smokeless tobacco. She reports that she does not drink alcohol or use drugs. Dana Bradley is allergic to aspirin; cymbalta [duloxetine hcl]; depakote [divalproex sodium]; duloxetine; gadolinium derivatives; haloperidol; iodinated diagnostic agents; iodine; meperidine; reglan [metoclopramide]; tramadol hcl; trazodone; compazine [prochlorperazine]; meloxicam; penicillins; tomato; other; shellfish allergy; shellfish-derived products; bacitracin-neomycin-polymyxin; cephalosporins; ibuprofen; latex; neosporin [neomycin-bacitracin zn-polymyx]; nsaids; sulfa antibiotics; sulfasalazine; and sulfonamide derivatives.   HPI  Today, she is being contacted for follow-up evaluation from the results of the recent lumbar and cervical MRIs.  According to the lumbar MRI, the findings include lumbosacral DDD with disc height loss at L1-2 and disc desiccation at L4-5 and L5-S1.  In addition there is evidence of an L1-2 right paracentral disc  protrusion and minimal broad-based disc bulge at L4-5.  The patient's primary complaint is that of right-sided low back pain and right lower extremity pain suggesting that a possible course of action could include either a right-sided interlaminar or a right transforaminal L1-2 ESI, as well as a right-sided interlaminar versus transforaminal L4-5 LESI.  According to the cervical MRI, the findings include mild  multilevel Cervical DDD, most significant at the C5-6 level with a shallow right central disc protrusion at C4-5 and mild facet hypertrophy; a small right central takes extrusion, which seems to have progressed, causing some mild spinal canal stenosis and relatively mild right neuroforaminal narrowing at C5-6.  At the C6-7 level there is a left central disc protrusion with associated mild left disc osteophyte ridge/uncinate hypertrophy.  Overall, not significantly changed since prior cervical MRI on 11/30/2014.  The patient's primary complaint in this area is that of a left-sided cervicalgia, cervicogenic headaches, and upper back pain, which would suggest that a possible course of action could include either left-sided cervical epidural steroid injections or left-sided cervical facet blocks.  The patient indicates currently having more pain in the left side of the lower back, close to the tailbone area with vaginal pain, pain in the perineum, as well as right lower extremity weakness and left lower extremity pain going all the way down to the bottom of her foot.  She describes the left lower extremity pain as running through the back and outside of the leg all the way down to the outside of the bottom of her foot and what seems to be a left-sided S1 lumbar radiculitis.  She indicates having difficulty today, when asked to "toe walk", and performing that maneuver.  She denies having more weakness on one side than the other.  She describes the pain in her left foot to be going into the area of the fourth and  fifth toe which feels numb.  She describes this pain as a numb, burning pain.  In addition she is describing bilateral groin pain with the left side being worse than the right.  She does have a history of irritable bowel syndrome, but she has been experiencing more problems with bowel and bladder incontinence that seem to be intermittent but that can occur to a certain extent almost 3-4 times per day.  Today I had the patient attempt to do toe walking them heel walking and she indicated some weakness when trying to do the toe walking suggesting an S1 radiculopathy.  In addition, I also had the patient perform a Patrick maneuver (figure 4 maneuver).  She describes experiencing pain in the left hip area and left SI joint area when performing the maneuver on the left side.  She also complained of positive groin pain and vaginal pain when doing the maneuver on the left side and also she noticed having more pain in the hip area as opposed to the low back area, when tested.  The maneuver was repeated on the right side and again she indicated having pain in the right lower back as well as right groin area and right hip area with the hip pain being worse than the low back pain (SI joint pain).  Based on the above results, I have recommended to proceed with a left-sided SI joint and left-sided diagnostic intra-articular hip joint injection under fluoroscopic guidance and IV sedation.  The patient has agreed.  In addition to the above, patient has been experiencing pain in the upper back going towards the abdominal region, bilaterally, where the pain is described to "wraparound her size going towards the front into the navel area with pain being above the navel, approximately the distance of a thumb.  And below the navel the distance of the width of the palm.  This pain seems to be bilateral and according to the distribution it would appear to be a T10-11 radiculitis.  Prior x-rays of the thoracic spine have  demonstrated degenerative disc disease in that area, but we do not have in any MRIs that could help [place] clarify better what is going on.  Since the patient does have MRIs of the cervical and lumbar spine both showing disc protrusions and extrusions, it stands to think that she may have similar findings in the thoracic region.  Because these are soft tissue findings, we will be ordering an MRI of the area to evaluate this possible T10-11 radiculitis/radiculopathy.  This could also help with the diagnosis of this patient's bowel and bladder incontinence.  Based on the above, I have recommended to proceed with a thoracic MRI, which the patient has agreed to do.  In addition, we will be referring the patient to a neurosurgeon to evaluate the cervical findings as well as all of her all other lower problems that may also be associated with thoracic and lumbar radiculopathies.  Pharmacotherapy Assessment  Analgesic: Hydrocodone/APAP 7.5/325, 1 tab PO QD (7.5 mg/day of hydrocodone)  MME/day:7.5mg /day.   Monitoring: Pharmacotherapy: No side-effects or adverse reactions reported. [place] PMP: PDMP reviewed during this encounter.       Compliance: No problems identified. Effectiveness: Clinically acceptable. Plan: Refer to "POC".  UDS:  Summary  Date Value Ref Range Status  03/05/2018 FINAL  Final    Comment:    ==================================================================== TOXASSURE SELECT 13 (MW) ==================================================================== Test                             Result       Flag       Units Drug Present   Hydrocodone                    422                     ng/mg creat   Hydromorphone                  105                     ng/mg creat   Norhydrocodone                 312                     ng/mg creat    Sources of hydrocodone include scheduled prescription    medications. Hydromorphone and norhydrocodone are expected    metabolites of hydrocodone.  Hydromorphone is also available as a    scheduled prescription medication. ==================================================================== Test                      Result    Flag   Units      Ref Range   Creatinine              148              mg/dL      >=20 ==================================================================== Declared Medications:  Medication list was not provided. ==================================================================== For clinical consultation, please call [phone]. ====================================================================    Laboratory Chemistry Profile (12 mo)  Renal: No results found for requested labs within last 8760 hours.  Lab Results  Component Value Date   GFRAA 102 11/24/2017   GFRNONAA 88 11/24/2017   Hepatic: No results found for requested labs within last 8760 hours. Lab Results  Component  Value Date   AST 14 11/24/2017   ALT 21 11/24/2017   Other: 07/30/2018: CRP 6.0; Sed Rate 17 Note: Above Lab results reviewed.  Imaging  Last 90 days:  Mr Brain Wo Contrast  Result Date: 07/09/2019 CLINICAL DATA:  Other vascular headache. Cervico-occipital neuralgia of left-side. Hallucinations. Left arm weakness. Ataxia. Fall, subsequent encounter. Vertigo of central origin. Headache, chronic, neuro deficit. Additional history provided: Constant migraines with hallucinations since 2010, ablation of occipital nerve April 2019. EXAM: MRI HEAD WITHOUT CONTRAST TECHNIQUE: Multiplanar, multiecho pulse sequences of the brain and surrounding structures were obtained without intravenous contrast. COMPARISON:  Brain MRI 02/13/2015 FINDINGS: Brain: There is no evidence of acute infarct. No evidence of intracranial mass. No midline shift or extra-axial fluid collection. No chronic intracranial blood products. A few small nonspecific foci of T2/FLAIR hyperintensity within the left frontal lobe white matter measuring up to 4 mm are stable as  compared to prior MRI 02/13/2015. Cerebral volume is normal for age. Vascular: Flow voids maintained within the proximal large arterial vessels. Skull and upper cervical spine: No focal marrow lesion Sinuses/Orbits: Visualized orbits demonstrate no acute abnormality. Small right maxillary sinus mucous retention cyst. Trace fluid within right mastoid air cells. IMPRESSION: 1. No evidence of acute intracranial abnormality. 2. Stable non-contrast MRI appearance of the brain as compared MRI 02/13/2015. A few small nonspecific signal changes within the left frontal lobe white matter are unchanged. Electronically Signed   By: Kellie Simmering   On: 07/09/2019 22:10   Mr Cervical Spine Wo Contrast  Result Date: 07/09/2019 CLINICAL DATA:  Cervicalgia. Cervico-occipital neuralgia of left side. Left arm weakness. Ataxia. Fall, subsequent encounter. Disease of spinal cord, unspecified. Additional history provided: Bilateral neck pain that radiates down into both shoulders, chronic, migraine, occipital nerve ablation April 2019. EXAM: MRI CERVICAL SPINE WITHOUT CONTRAST TECHNIQUE: Multiplanar, multisequence MR imaging of the cervical spine was performed. No intravenous contrast was administered. COMPARISON:  Radiographs of the cervical spine 07/09/2019, cervical spine MRI 11/30/2014 FINDINGS: Multiple sequences are motion degraded. Alignment: No significant spondylolisthesis. Vertebrae: Vertebral body height is maintained. No suspicious osseous lesions Cord: Apparent T2 hyperintensity within the cervical spinal cord at the C3 level is seen only on the motion degrades sagittal T2 sequence and is likely artifactual. No convincing spinal cord signal abnormality. Posterior Fossa, vertebral arteries, paraspinal tissues: Posterior fossa better appreciated on concurrent noncontrast brain MRI. Flow voids preserved within the imaged cervical vertebral arteries. Imaged paraspinal soft tissues within normal limits. Disc levels: Unless  otherwise stated, the level by level findings below have not significantly changed since prior MRI 11/30/2014. Mild multilevel disc degeneration greatest at C5-C6. C2-C3: No significant canal or foraminal stenosis. C3-C4:  No significant canal or foraminal stenosis. C4-C5: Shallow right center disc protrusion. Mild uncinate hypertrophy. No significant spinal canal or neural foraminal narrowing. C5-C6: A small right center disc extrusion has slightly progressed from prior MRI (series 20, image 7). Associated mild osteophyte ridge and uncinate hypertrophy (greater on the right). The disc extrusion partially effaces the ventral thecal sac and contacts the ventral spinal cord, contributing to overall mild spinal canal stenosis. Spinal canal stenosis has slightly progressed. Mild relative right neural foraminal narrowing. C6-C7: Shallow left center disc protrusion with mild associated left-sided disc osteophyte ridge/uncinate hypertrophy. No significant spinal canal stenosis or neural foraminal narrowing. C7-T1: No significant canal or foraminal stenosis. IMPRESSION: A small C5-C6 right center disc extrusion has slightly progressed since MRI 11/30/2014. Only mild spinal canal and relative right neural foraminal  narrowing at this level. Cervical spondylosis is otherwise unchanged. Additional small disc protrusions as described. No significant spinal canal or neural foraminal narrowing at the remaining levels. Electronically Signed   By: Kellie Simmering   On: 07/09/2019 22:40   Mr Lumbar Spine Wo Contrast  Result Date: 07/06/2019 CLINICAL DATA:  No known injury.  Felt a pop. EXAM: MRI LUMBAR SPINE WITHOUT CONTRAST TECHNIQUE: Multiplanar, multisequence MR imaging of the lumbar spine was performed. No intravenous contrast was administered. COMPARISON:  None. FINDINGS: Segmentation:  Standard. Alignment:  Physiologic. Vertebrae:  No fracture, evidence of discitis, or bone lesion. Conus medullaris and cauda equina: Conus  extends to the L1 level. Conus and cauda equina appear normal. Paraspinal and other soft tissues: No acute paraspinal abnormality. Disc levels: Disc spaces: Degenerative disc disease with disc height loss at L1-2. Disc desiccation at L4-5 and L5-S1. T12-L1: No significant disc bulge. No evidence of neural foraminal stenosis. No central canal stenosis. L1-L2: Right paracentral disc protrusion. No evidence of neural foraminal stenosis. No central canal stenosis. L2-L3: No significant disc bulge. No evidence of neural foraminal stenosis. No central canal stenosis. L3-L4: No significant disc bulge. No evidence of neural foraminal stenosis. No central canal stenosis. L4-L5: Minimal broad-based disc bulge. No evidence of neural foraminal stenosis. No central canal stenosis. L5-S1: No significant disc bulge. No evidence of neural foraminal stenosis. No central canal stenosis. IMPRESSION: 1. At L1-L2 there is a right paracentral disc protrusion. 2. At L4-L5 minimal broad-based disc bulge. Electronically Signed   By: Kathreen Devoid   On: 07/06/2019 10:31   Xr Elbow 2 Views Left  Result Date: 07/09/2019 No acute or structural abnormalities  Xr Cervical Spine 2 Or 3 Views  Result Date: 07/09/2019 No acute or structural abnormalities   Assessment  The primary encounter diagnosis was Chronic pain syndrome. Diagnoses of DDD (degenerative disc disease), lumbosacral, Chronic low back pain (Primary Area of Pain) (Bilateral) (R>L) (midline), Chronic upper back pain (Secondary area of Pain) (Bilateral) (L>R), Chronic occipital neuralgia (Third area of Pain) (Bilateral) (L>R), DDD (degenerative disc disease), cervical, Cervical facet syndrome (Bilateral) (L>R), Cervical spondylosis with myelopathy and radiculopathy, Cervicogenic headache, Cervico-occipital neuralgia of left side, Chronic cervical radicular pain (Bilateral) (L>R), Other intervertebral disc degeneration, thoracic region, DDD (degenerative disc disease),  thoracic, Chronic hip pain (Left), Osteoarthritis of hip (Left), Chronic sacroiliac joint pain (Left), Chronic groin pain (Bilateral) (L>R), Chronic hip pain (Bilateral) (L>R), Somatic dysfunction of sacroiliac joint (Bilateral), and Thoracic radiculitis (Bilateral: T10,T11) were also pertinent to this visit.  Plan of Care  I have discontinued Dana Bradley's oxyCODONE. I am also having her maintain her Vitamin B-12, magnesium oxide, ondansetron, Dexlansoprazole, Cholecalciferol (D3-1000 PO), atorvastatin, metoprolol tartrate, acetaminophen, Melatonin, baclofen, VALERIAN PO, Emgality, triamcinolone cream, pregabalin, predniSONE, and HYDROcodone-acetaminophen.  Pharmacotherapy (Medications Ordered): Meds ordered this encounter  Medications  . HYDROcodone-acetaminophen (NORCO) 7.5-325 MG tablet    Sig: Take 1 tablet by mouth every 6 (six) hours as needed for severe pain. Must last 90 days    Dispense:  30 tablet    Refill:  0    Chronic Pain: STOP Act (Not applicable) Fill 1 day early if closed on refill date. Do not fill until: 08/15/2019. To last until: 11/13/2019. Avoid benzodiazepines within 8 hours of opioids   Orders:  Orders Placed This Encounter  Procedures  . Cervical Epidural Injection    Procedure: Cervical Epidural Steroid Injection/Block Purpose: Diagnostic Indication(s): Radiculitis and/or cervicalgia associater with cervical degenerative disc disease.    Standing  Status:   Standing    Number of Occurrences:   6    Standing Expiration Date:   01/18/2021    Scheduling Instructions:     Level(s): C7-T1     Laterality: Left-sided     Sedation: Patient's choice.     Timeframe: PRN    Order Specific Question:   Where will this procedure be performed?    Answer:   [facility]    Comments:   by Dr. Dossie Arbour  . CERVICAL FACET (MEDIAL BRANCH NERVE BLOCK)     CLINICAL INDICATIONS: Neck pain. Indications: Cervical Facet Syndrome.    Standing Status:   Standing    Number of  Occurrences:   2    Standing Expiration Date:   01/18/2021    Scheduling Instructions:     LATERALITY: Left-sided     Level: C3-4, C4-5, C5-6 Facet joints (C3, C4, C5, C6, & C7 Medial Branch Nerves)     SEDATION: Patient's choice.     TIMEFRAME: PRN procedure. (Dana Bradley will call when needed.)    Order Specific Question:   Where will this procedure be performed?    Answer:   [facility]  . Lumbar Epidural Injection    Standing Status:   Standing    Number of Occurrences:   9    Standing Expiration Date:   01/18/2021    Scheduling Instructions:     Purpose: Therapeutic     Indication: Lower extremity pain/Sciatica right (M54.31).     Side: Right-sided     Level: TBD     Sedation: Patient's choice.     TIMEFRAME: PRN procedure. (Dana Bradley will call when needed.)    Order Specific Question:   Where will this procedure be performed?    Answer:   [facility]  . Lumbar Transforaminal Epidural    Standing Status:   Standing    Number of Occurrences:   1    Standing Expiration Date:   07/20/2020    Scheduling Instructions:     Purpose: Therapeutic     Indication: Lower extremity pain. Radiculopathy/Radiculitis lumbar (M54.16).     Side: Right-sided     Level: TBD     Sedation: Patient's choice.     TIMEFRAME: PRN procedure. (Dana Bradley will call when needed.)    Order Specific Question:   Where will this procedure be performed?    Answer:   [facility]  . SACROILIAC JOINT INJECTION    Standing Status:   Future    Standing Expiration Date:   08/21/2019    Scheduling Instructions:     Side: Left-sided     Sedation: With Sedation.     Timeframe: ASAP    Order Specific Question:   Where will this procedure be performed?    Answer:   [facility]  . HIP INJECTION    Standing Status:   Future    Standing Expiration Date:   08/20/2019    Scheduling Instructions:     Side: Left-sided     Sedation: With Sedation.     Timeframe: As soon as schedule  allows  . MR THORACIC SPINE WO CONTRAST    In addition to any acute findings, please report on degenerative changes related to: (Please specify level(s)) (1) ROM & instability (>53mm displacement) (2) Facet joint (Zygoapophyseal Joint) (3) DDD and/or IVDD (4) Pars defects (5) Previous surgical changes (Include description of hardware and hardware status, if present) (6) Presence and degree of  spondylolisthesis, spondylosis, and/or spondyloarthropathies)  (7) Old Fractures (8) Demineralization (9) Additional bone pathology (10) Stenosis (Central, Lateral Recess, Foraminal) (11) If at all possible, please provide AP diameter (mm) of foraminal and/or central canal.    Standing Status:   Future    Standing Expiration Date:   10/21/2019    Order Specific Question:   What is the patient's sedation requirement?    Answer:   No Sedation    Order Specific Question:   Does the patient have a pacemaker or implanted devices?    Answer:   No    Order Specific Question:   Preferred imaging location?    Answer:   [facility] (table limit-300lbs)    Order Specific Question:   Call Results- Best Contact Number?    Answer:   [phone] ([facility])    Order Specific Question:   Radiology Contrast Protocol - do NOT remove file path    Answer:   [email or web address]  . Ambulatory referral to Neurosurgery    Referral Priority:   Routine    Referral Type:   Surgical    Referral Reason:   Specialty Services Required    Requested Specialty:   Neurosurgery    Number of Visits Requested:   1   Follow-up plan:   Return in about 4 months (around 11/13/2019) for (VV), (MM), in addition, Procedure (w/ sedation):(L) SI BLK + (L) IA Hip inj..      Considering: Diagnostic bilateral lumbar facet block #2 Possible bilateral lumbar facet RFA. Diagnostic thoracic facet block Possible bilateral thoracic facet RFA. Diagnostic bilateral cervical facet blocks Possible  bilateral cervical facet RFA. Diagnostic bilateral lesser occipital nerve blocks Diagnostic bilateral C2 and TON nerve block Possible bilateral occipital nerve RFA. Diagnostic right T10-11 TESI Diagnostic bilateral suprascapular nerve block Diagnostic bilateral intra-articular knee joint injection Possible bilateral intra-articular Hyalgan knee injections.  Diagnostic bilateral Genicular nerve block Possible bilateral Genicular nerve RFA.   Palliative PRN treatment(s): Palliative left trapezius muscle MNB Palliative left L4-5 LESI PalliativeLeft CESI      Recent Visits Date Type Provider Dept  05/17/19 Office Visit Milinda Pointer, MD [facility]  Showing recent visits within past 90 days and meeting all other requirements   Today's Visits Date Type Provider Dept  07/21/19 Office Visit Milinda Pointer, MD [facility]  Showing today's visits and meeting all other requirements   Future Appointments Date Type Provider Dept  08/11/19 Appointment Milinda Pointer, MD [facility]  Showing future appointments within next 90 days and meeting all other requirements   I discussed the assessment and treatment plan with the patient. The patient was provided an opportunity to ask questions and all were answered. The patient agreed with the plan and demonstrated an understanding of the instructions.  Patient advised to call back or seek an in-person evaluation if the symptoms or condition worsens.  Total duration of non-face-to-face encounter: 25 minutes.  Note by: Gaspar Cola, MD Date: 07/21/2019; Time: 11:09 AM  Note: This dictation was prepared with Dragon dictation. Any transcriptional errors that may result from this process are unintentional.  Disclaimer:  * Given the special circumstances of the COVID-19 pandemic, the federal government has announced that the Office for Civil Rights (OCR) will exercise its enforcement  discretion and will not impose penalties on physicians using telehealth in the event of noncompliance with regulatory requirements under the [place] (HIPAA) in connection with the good faith provision of telehealth during the  XX123456 [employer]. ([place])

## 2019-07-26 ENCOUNTER — Other Ambulatory Visit: Payer: Self-pay

## 2019-07-26 ENCOUNTER — Encounter: Payer: Self-pay | Admitting: Family Medicine

## 2019-07-26 ENCOUNTER — Ambulatory Visit: Payer: Medicaid Other | Admitting: Pain Medicine

## 2019-07-26 ENCOUNTER — Ambulatory Visit (INDEPENDENT_AMBULATORY_CARE_PROVIDER_SITE_OTHER): Payer: Medicaid Other | Admitting: Family Medicine

## 2019-07-26 DIAGNOSIS — E538 Deficiency of other specified B group vitamins: Secondary | ICD-10-CM

## 2019-07-26 DIAGNOSIS — R1011 Right upper quadrant pain: Secondary | ICD-10-CM

## 2019-07-26 DIAGNOSIS — R197 Diarrhea, unspecified: Secondary | ICD-10-CM

## 2019-07-26 DIAGNOSIS — R109 Unspecified abdominal pain: Secondary | ICD-10-CM

## 2019-07-26 DIAGNOSIS — E559 Vitamin D deficiency, unspecified: Secondary | ICD-10-CM

## 2019-07-26 DIAGNOSIS — Z13220 Encounter for screening for lipoid disorders: Secondary | ICD-10-CM

## 2019-07-26 MED ORDER — DICYCLOMINE HCL 10 MG PO CAPS: 10.0000 mg | ORAL_CAPSULE | Freq: Three times a day (TID) | ORAL | 1 refills | Status: DC

## 2019-07-26 NOTE — Progress Notes (Signed)
Name: Dana Bradley   MRN: [id number]    DOB: 08/15/1971   Date:07/26/2019       Progress Note  Subjective  Chief Complaint  Chief Complaint  Patient presents with  . Diarrhea    I connected with  [name] on 07/26/19 at  1:20 PM EDT by telephone and verified that I am speaking with the correct person using two identifiers.   I discussed the limitations, risks, security and privacy concerns of performing an evaluation and management service by telephone and the availability of in person appointments. Staff also discussed with the patient that there may be a patient responsible charge related to this service. Patient Location: Home Provider Location: Office Additional Individuals present: None  HPI  Pt presents with concern for IBS ongoing; intermittent RUQ pain for about a year; she has now had diarrhea since August 23 - she is having several BM's a day.  She notes she has mucus or yellow color in her BM's, will sometimes have "gritty/sand" BM's; also will have watery BM's at times.  After BM her pain does improve, then worsens again a few hours later.  She has had a few episodes of very small amount of stool incontinence (typically mucus).  Discussed laboratory testing; she has been staying well hydrated; no fevers or chills.  Patient Active Problem List   Diagnosis Date Noted  . DDD (degenerative disc disease), cervical 07/21/2019  . Cervical facet syndrome (Bilateral) (L>R) 07/21/2019  . DDD (degenerative disc disease), thoracic 07/21/2019  . Osteoarthritis of hip (Left) 07/21/2019  . Chronic groin pain (Bilateral) (L>R) 07/21/2019  . Chronic hip pain (Bilateral) (L>R) 07/21/2019  . Somatic dysfunction of sacroiliac joint (Bilateral) 07/21/2019  . Migraine with aura and with status migrainosus, not intractable 04/06/2019  . Cervico-occipital neuralgia of left side 04/06/2019  . Weakness of leg (Left) 04/05/2019  . Difficulty walking 04/05/2019  . Chronic migraine without  aura, with intractable migraine, so stated, with status migrainosus 12/27/2018  . Malar rash 09/04/2018  . Trigger point of neck (Left) 03/19/2018  . Occipital headache 12/25/2017  . Chronic fatigue syndrome with fibromyalgia 12/12/2017  . Trigger point of shoulder region (Left) 11/17/2017  . Myofascial pain syndrome (Left) (trapezius muscle) 07/22/2017  . Lumbar L1-2 disc protrusion (Right) 04/07/2017  . Muscle spasticity 04/01/2017  . Osteoarthritis of shoulder (Bilateral) 04/01/2017  . Lumbar spondylosis 01/06/2017  . Chronic hip pain (Left) 12/24/2016  . Chronic sacroiliac joint pain (Left) 12/24/2016  . Lumbar facet joint syndrome (B) (L>R) 12/24/2016  . Lumbar radiculitis (Left) 12/24/2016  . Hypertriglyceridemia 11/27/2016  . History of vasovagal episode 10/30/2016  . Cervicogenic headache 09/09/2016  . Medication monitoring encounter 08/29/2016  . Controlled substance agreement signed 08/28/2016  . Plantar fasciitis of left foot 08/28/2016  . Vitamin B12 deficiency 08/28/2016  . Hyperlipidemia 08/28/2016  . Nephrolithiasis 08/12/2016  . Chronic pain syndrome 08/07/2016  . Long term prescription opiate use 08/07/2016  . Opiate use 08/07/2016  . Long term prescription benzodiazepine use 08/07/2016  . Neurogenic pain 08/07/2016  . Chronic low back pain (Primary Area of Pain) (Bilateral) (R>L) (midline) 08/07/2016  . Chronic upper back pain (Secondary area of Pain) (Bilateral) (L>R) 08/07/2016  . Chronic abdominal pain (Right lower quadrant) 08/07/2016  . Thoracic radiculitis (Bilateral: T10, T11) 08/07/2016  . Chronic occipital neuralgia (Third area of Pain) (Bilateral) (L>R) 08/07/2016  . Chronic neck pain 08/07/2016  . Chronic cervical radicular pain (Bilateral) (L>R) 08/07/2016  . Chronic shoulder blade pain (Bilateral) (  L>R) 08/07/2016  . Chronic upper extremity pain (Bilateral) (R>L) 08/07/2016  . Chronic knee pain (Bilateral) (R>L) 08/07/2016  . Chronic ankle pain  (Bilateral) 08/07/2016  . Cervical spondylosis with myelopathy and radiculopathy 08/07/2016  . Panic disorder with agoraphobia 05/29/2016  . Depression, unspecified depression type 05/29/2016  . Atypical lymphocytosis 05/01/2016  . Vitamin D insufficiency 05/01/2016  . Chronic lower extremity cramps (Bilateral) (R>L) 04/29/2016  . Obesity 04/29/2016  . GAD (generalized anxiety disorder) 04/29/2016  . Fatigue 04/29/2016  . Insomnia 07/12/2015  . Migraine without aura and with status migrainosus, not intractable 07/12/2015  . Chronic superficial gastritis 06/02/2015  . Chronic pain of multiple joints 05/15/2015  . Bilateral leg edema 05/02/2015  . Paroxysmal supraventricular tachycardia ([place]) 04/17/2015  . Exertional shortness of breath 04/17/2015  . Bright red rectal bleeding 04/06/2015  . DDD (degenerative disc disease), lumbosacral 01/24/2014  . DDD (degenerative disc disease), lumbar 01/24/2014  . Cervico-occipital neuralgia 12/29/2013  . Fibromyalgia 12/29/2013  . Migraine headache 12/29/2013  . Menorrhagia 12/10/2012  . Depression, major, recurrent, in remission ([place]) 01/12/2009  . Chest pain 01/12/2009  . Hypertension, benign essential, goal below 140/90 06/23/2008  . History of PSVT (paroxysmal supraventricular tachycardia) 06/17/2008  . Obstructive sleep apnea, adult 06/17/2008  . GERD 06/13/2008    Social History   Tobacco Use  . Smoking status: Former Smoker    Packs/day: 4.00    Years: 3.00    Pack years: 12.00    Types: Cigarettes    Quit date: 12/10/1992    Years since quitting: 26.6  . Smokeless tobacco: Never Used  . Tobacco comment: quit 25 years ago  Substance Use Topics  . Alcohol use: No    Alcohol/week: 0.0 standard drinks     Current Outpatient Medications:  .  acetaminophen (TYLENOL) 500 MG tablet, Take 500-1,000 mg by mouth as needed., Disp: , Rfl:  .  atorvastatin (LIPITOR) 40 MG tablet, Take 1 tablet (40 mg total) by mouth at bedtime. This  replaces crestor (rosuvastatin), Disp: 90 tablet, Rfl: 1 .  Cholecalciferol (D3-1000 PO), Take 1,000 mg by mouth daily., Disp: , Rfl:  .  Cyanocobalamin (VITAMIN B-12) 500 MCG SUBL, Place 1,500 mcg under the tongue every other day. , Disp: 150 tablet, Rfl:  .  Dexlansoprazole (DEXILANT) 30 MG capsule, Take 1 capsule (30 mg total) by mouth daily. This replaces omeprazole, Disp: 30 capsule, Rfl: 2 .  Galcanezumab-gnlm (EMGALITY) 120 MG/ML SOAJ, Inject 120 mg into the skin every 30 (thirty) days., Disp: 1 pen, Rfl: 11 .  [START ON 08/15/2019] HYDROcodone-acetaminophen (NORCO) 7.5-325 MG tablet, Take 1 tablet by mouth every 6 (six) hours as needed for severe pain. Must last 90 days, Disp: 30 tablet, Rfl: 0 .  magnesium oxide (MAG-OX) 400 MG tablet, Take 300 mg by mouth daily. , Disp: , Rfl:  .  Melatonin 10 MG TABS, Take 10 mg by mouth at bedtime., Disp: , Rfl:  .  metoprolol tartrate (LOPRESSOR) 50 MG tablet, Take 1 tablet (50 mg total) by mouth 2 (two) times daily., Disp: 180 tablet, Rfl: 1 .  ondansetron (ZOFRAN) 4 MG tablet, Take 1 tablet (4 mg total) by mouth every 8 (eight) hours as needed., Disp: 20 tablet, Rfl: 2 .  pregabalin (LYRICA) 150 MG capsule, Take 1 capsule (150 mg total) by mouth every 8 (eight) hours., Disp: 270 capsule, Rfl: 1 .  triamcinolone cream (KENALOG) 0.1 %, Apply 1 application topically 2 (two) times daily., Disp: 30 g, Rfl: 0 .  VALERIAN PO, Take by mouth as needed. Makes Valerian tea about 3-4 times per week., Disp: , Rfl:  .  baclofen (LIORESAL) 10 MG tablet, Take 1 tablet (10 mg total) by mouth 3 (three) times daily. (Patient taking differently: Take 10 mg by mouth 3 (three) times daily. Uses as needed.), Disp: 90 tablet, Rfl: 2 .  predniSONE (STERAPRED UNI-PAK 21 TAB) 10 MG (21) TBPK tablet, Take as directed (Patient not taking: Reported on 07/26/2019), Disp: 21 tablet, Rfl: 0  Allergies  Allergen Reactions  . Aspirin Swelling  . Cymbalta [Duloxetine Hcl] Other (See  Comments)    Suicidal ideations and has homicidal thoughts per patient  . Depakote [Divalproex Sodium] Shortness Of Breath    W/ n/v  . Duloxetine Other (See Comments)    Suicidal idealation  . Gadolinium Derivatives     Pt was unable to breath  . Haloperidol Shortness Of Breath    W/ n/v  . Iodinated Diagnostic Agents Other (See Comments)  . Iodine Shortness Of Breath  . Meperidine Nausea And Vomiting    Patient projectile vomits and usually result in ER  . Reglan [Metoclopramide] Shortness Of Breath    Can't breath, wheezes  . Tramadol Hcl Palpitations    Severely and adversely affects her SVT giving her tachycardias of 150-160 bpm.  . Trazodone Shortness Of Breath  . Compazine [Prochlorperazine] Other (See Comments)    Panic attack  . Meloxicam Other (See Comments)    mouth sores, tingling, blisters in mouth  . Penicillins Rash  . Tomato Hives    Tongue will blister  . Other   . Shellfish Allergy     Other reaction(s): Unknown  . Shellfish-Derived Products Other (See Comments)  . Bacitracin-Neomycin-Polymyxin Rash  . Cephalosporins Rash    rash  . Ibuprofen Other (See Comments) and Rash    Blisters in mouth. Blisters in mouth.  . Latex Itching  . Neosporin [Neomycin-Bacitracin Zn-Polymyx] Rash  . Nsaids Other (See Comments)    Blisters in mouth; can take 1 ibuprofen 2x a month  . Sulfa Antibiotics Rash    rash Other reaction(s): Unknown rash   . Sulfasalazine Rash  . Sulfonamide Derivatives Rash    I personally reviewed active problem list, medication list, allergies, notes from last encounter, lab results with the patient/caregiver today.  ROS  Ten systems reviewed and is negative except as mentioned in HPI  Objective  Virtual encounter, vitals not obtained.  There is no height or weight on file to calculate BMI.  Nursing Note and Vital Signs reviewed.  Physical Exam   Pulmonary/Chest: Effort normal. No respiratory distress. Speaking in complete  sentences Neurological: Pt is alert and oriented to person, place, and time. Speech is normal Psychiatric: Patient has a normal mood and affect. behavior is normal. Judgment and thought content normal.  No results found for this or any previous visit (from the past 72 hour(s)).  Assessment & Plan  1. RUQ pain - dicyclomine (BENTYL) 10 MG capsule; Take 1 capsule (10 mg total) by mouth 4 (four) times daily -  before meals and at bedtime.  Dispense: 90 capsule; Refill: 1 - US Abdomen Limited RUQ; Future - CBC with Differential/Platelet; Future - Gastrointestinal Panel by PCR , Stool; Future  2. Abdominal cramping - dicyclomine (BENTYL) 10 MG capsule; Take 1 capsule (10 mg total) by mouth 4 (four) times daily -  before meals and at bedtime.  Dispense: 90 capsule; Refill: 1 - US Abdomen Limited RUQ; Future - CBC  with Differential/Platelet; Future - Comprehensive metabolic panel; Future - Gastrointestinal Panel by PCR , Stool; Future  3. Diarrhea, unspecified type - dicyclomine (BENTYL) 10 MG capsule; Take 1 capsule (10 mg total) by mouth 4 (four) times daily -  before meals and at bedtime.  Dispense: 90 capsule; Refill: 1 - US Abdomen Limited RUQ; Future - CBC with Differential/Platelet; Future - Comprehensive metabolic panel; Future - Gastrointestinal Panel by PCR , Stool; Future  4. Vitamin B12 deficiency - B12 and Folate Panel; Future  5. Vitamin D insufficiency - VITAMIN D 25 Hydroxy (Vit-D Deficiency, Fractures); Future  6. Lipid screening - Lipid panel; Future  Will consider GI referral if work up shows unclear etiology or warrants referral; ddx - cholecystitis, IBS, infectious colitis  -Red flags and when to present for emergency care or RTC including fever >101.49F, chest pain, shortness of breath, new/worsening/un-resolving symptoms, reviewed with patient at time of visit. Follow up and care instructions discussed and provided in AVS. - I discussed the assessment and  treatment plan with the patient. The patient was provided an opportunity to ask questions and all were answered. The patient agreed with the plan and demonstrated an understanding of the instructions.  - The patient was advised to call back or seek an in-person evaluation if the symptoms worsen or if the condition fails to improve as anticipated.  I provided 18 minutes of non-face-to-face time during this encounter.  Hubbard Hartshorn, FNP

## 2019-07-30 ENCOUNTER — Encounter: Payer: Self-pay | Attending: Physical Medicine & Rehabilitation | Admitting: Physical Medicine & Rehabilitation

## 2019-07-30 ENCOUNTER — Other Ambulatory Visit: Payer: Self-pay

## 2019-07-30 ENCOUNTER — Encounter: Payer: Self-pay | Admitting: Physical Medicine & Rehabilitation

## 2019-07-30 VITALS — BP 111/76 | HR 76 | Temp 97.5°F | Ht 63.0 in | Wt 217.0 lb

## 2019-07-30 DIAGNOSIS — E66812 Obesity, class 2: Secondary | ICD-10-CM

## 2019-07-30 DIAGNOSIS — M797 Fibromyalgia: Secondary | ICD-10-CM

## 2019-07-30 DIAGNOSIS — G43119 Migraine with aura, intractable, without status migrainosus: Secondary | ICD-10-CM

## 2019-07-30 DIAGNOSIS — M7918 Myalgia, other site: Secondary | ICD-10-CM

## 2019-07-30 DIAGNOSIS — Z6838 Body mass index (BMI) 38.0-38.9, adult: Secondary | ICD-10-CM

## 2019-07-30 NOTE — Progress Notes (Addendum)
Subjective:    Patient ID: Dana Bradley, female    DOB: November 30, 1970, 48 y.o.   MRN: [id number]  HPI She has been having migraines since 2008. They started without any inciting factor. She has associated photophobia, phonophobia. Now she has migraines about three days a week and they last 42 hours. She needs help showering and going to the bathroom. Her daughter and husband have to help her maintain the house. She doesn't leave the house much because she doesn't know when she will get a migraine. She doesn't drive very often because she doesn't want to hurt anybody. She sits and lays a lot. She has tried almost every migraine medication and the Marland injectible reduces the duration to 30 hours. Lying down in a dark room helps to prevent progression of the migraine. She has never tried Botox before. Her daughter also has severe migraines and is planning on receiving Botox as well.   Her major life stressor is poverty and her daughter getting sick. Her greatest source of joy is her family, children, and gardening.   Sleep: Reports sleep as fair. Sometimes her migraines wake her up. Her IBS is usually a larger cause of sleep disturbance and she feels that this could be a contributor to her migraines.   She also has fibromyalgia. She takes Lyrica three times a day but still has a lot of pain. Her pain is worst in the winter. She describes layers of pain from her fibromyalgia.   She feels that if she could work out the tension in her neck muscles that would help with her migraines.   She has been progressively limited in her ability to walk. She is now barely able to walk to her mailbox at this time. She does try to garden every day. She tries to walk the perimeter of her lot once or twice per day.   Date migraines started: 2008 Number of headache days/month:  Baseline: 12 per month  Current 12 month Number of headache hours per day: Baseline 42 hours at a time Current 42 hours at a time Associated:  photophobia, phonophobia Moderate or severe pain intensity: 10/10 Nausea: yes Vomiting: yes Photophobia: yes Phonophobia: yes Unilateral: just the left Pulsating: yes Disability/ER visits: Not in the past year. Has filed for disability but has not been granted.   For stated, patient notes severe debilitating migraines with limited, but most improvement with Emgality.  She had also tried Ajovy without success in the past.  Patient seen with Dr. Adam Phenix.  Pain Inventory Average Pain 7 Pain Right Now 6 My pain is intermittent, sharp, burning, stabbing, tingling, aching and pulsating, ripping sensation  In the last 24 hours, has pain interfered with the following? General activity 6 Relation with others 4 Enjoyment of life 7 What TIME of day is your pain at its worst? evening Sleep (in general) Fair  Pain is worse with: unsure Pain improves with: rest, heat/ice, medication and TENS Relief from Meds: 4  Mobility walk with assistance use a cane ability to climb steps?  yes do you drive?  yes Do you have any goals in this area?  no  Function disabled: date disabled . I need assistance with the following:  dressing, bathing, meal prep, household duties and shopping Do you have any goals in this area?  no  Neuro/Psych bladder control problems bowel control problems weakness numbness tingling trouble walking spasms confusion anxiety  Prior Studies CT/MRI new  Physicians involved in your care Primary care [place]  Uvaldo Rising, NP Neurologist [facility]   Family History  Problem Relation Age of Onset  . Depression Mother   . Hypertension Mother   . Cancer Mother        Skin  . Hyperlipidemia Mother   . Anxiety disorder Mother   . Migraines Mother   . Alcohol abuse Father   . Depression Father   . Stroke Father   . Heart disease Father   . Hypertension Father   . Anxiety disorder Father   . Depression Sister   .  Hyperlipidemia Sister   . Diabetes Sister   . Hypertension Sister   . Polycystic ovary syndrome Sister   . Bipolar disorder Sister   . Anxiety disorder Sister   . Migraines Sister   . Cancer Maternal Grandmother 65       Breast  . Thyroid disease Maternal Grandmother   . Arthritis Maternal Grandmother   . Hyperlipidemia Maternal Grandmother   . Depression Sister   . Hypertension Sister   . Anxiety disorder Sister   . Migraines Sister   . Alzheimer's disease Other   . Aneurysm Maternal Grandfather   . Hypertension Maternal Grandfather   . Heart disease Maternal Grandfather   . Alzheimer's disease Paternal Grandmother   . Heart attack Paternal Grandfather   . Hypertension Paternal Grandfather   . COPD Paternal Grandfather   . Heart disease Paternal Grandfather   . Migraines Son   . Migraines Daughter   . Migraines Daughter   . Bladder Cancer Neg Hx   . Kidney cancer Neg Hx    Social History   Socioeconomic History  . Marital status: Married    Spouse name: brian  . Number of children: 3  . Years of education: Not on file  . Highest education level: Associate degree: academic program  Occupational History  . Occupation: disbled    Comment: not able  Social Needs  . Financial resource strain: Very hard  . Food insecurity    Worry: Often true    Inability: Often true  . Transportation needs    Medical: Yes    Non-medical: Yes  Tobacco Use  . Smoking status: Former Smoker    Packs/day: 4.00    Years: 3.00    Pack years: 12.00    Types: Cigarettes    Quit date: 12/10/1992    Years since quitting: 26.6  . Smokeless tobacco: Never Used  . Tobacco comment: quit 25 years ago  Substance and Sexual Activity  . Alcohol use: No    Alcohol/week: 0.0 standard drinks  . Drug use: No  . Sexual activity: Not Currently    Partners: Male    Birth control/protection: Surgical  Lifestyle  . Physical activity    Days per week: 0 days    Minutes per session: 0 min  .  Stress: Rather much  Relationships  . Social Herbalist on phone: Never    Gets together: Never    Attends religious service: More than 4 times per year    Active member of club or organization: No    Attends meetings of clubs or organizations: Never    Relationship status: Married  Other Topics Concern  . Not on file  Social History Narrative   Lives at home with her husband and 2 of her children   Right handed   Caffeine: 0-2 cups daily   Past Surgical History:  Procedure Laterality Date  . ABLATION  Uterine  . CARDIAC CATHETERIZATION     with ablation  . COLONOSCOPY WITH PROPOFOL N/A 05/17/2015   Procedure: COLONOSCOPY WITH PROPOFOL;  Surgeon: Manya Silvas, MD;  Location[facility] ENDOSCOPY;  Service: Endoscopy;  Laterality: N/A;  . ESOPHAGOGASTRODUODENOSCOPY N/A 05/17/2015   Procedure: ESOPHAGOGASTRODUODENOSCOPY (EGD);  Surgeon: Manya Silvas, MD;  Location: [facility] ENDOSCOPY;  Service: Endoscopy;  Laterality: N/A;  . KNEE ARTHROSCOPY    . spg     6/18  . Tibial Tubercle Bypass Right 1998  . TUBAL LIGATION  10/01/99   Past Medical History:  Diagnosis Date  . Acute postoperative pain 04/07/2017  . Anxiety   . Bursitis   . Chronic fatigue 12/12/2017  . Chronic fatigue syndrome   . Edema leg 05/02/2015  . Fibromyalgia   . GERD (gastroesophageal reflux disease)   . IBS (irritable bowel syndrome)   . Knee pain, bilateral 12/21/2008   Qualifier: Diagnosis of  By: Hassell Done FNP, Tori Milks    . Lumbar discitis   . Migraines   . Osteoarthritis   . Right hand pain 04/10/2015   [facility] has done nerve conduction studies and ruled out carpal tunnel.   . Sleep apnea   . Spinal stenosis   . SVT (supraventricular tachycardia) ([place])   . Vertigo   . Vitamin D deficiency 05/01/2016   BP 111/76   Pulse 76   Temp (!) 97.5 F (36.4 C)   Ht 5\' 3"  (1.6 m)   Wt 217 lb (98.4 kg)   SpO2 96%   BMI 38.44 kg/m   Opioid Risk Score:   Fall Risk Score:   `1  Depression screen PHQ 2/9  Depression screen [facility] 2/9 07/30/2019 07/26/2019 04/20/2019 11/25/2018 09/04/2018 07/30/2018 04/07/2018  Decreased Interest 1 1 0 2 2 0 0  Down, Depressed, Hopeless 0 1 0 1 1 0 0  PHQ - 2 Score 1 2 0 3 3 0 0  Altered sleeping 2 1 0 3 3 2  -  Tired, decreased energy 3 1 0 3 3 2  -  Change in appetite 1 1 0 2 3 1  -  Feeling bad or failure about yourself  1 1 0 1 2 1  -  Trouble concentrating 1 1 0 3 1 0 -  Moving slowly or fidgety/restless 1 1 0 0 0 0 -  Suicidal thoughts 0 0 0 0 1 0 -  PHQ-9 Score 10 8 0 15 16 6  -  Difficult doing work/chores Somewhat difficult Somewhat difficult Not difficult at all Very difficult Somewhat difficult Not difficult at all -  Some recent data might be hidden    Review of Systems  Eyes: Positive for photophobia and visual disturbance.  Respiratory: Positive for apnea.   Cardiovascular: Positive for leg swelling.  Gastrointestinal: Positive for abdominal pain, diarrhea and nausea.       Incontinence  Genitourinary:       Incontinence  Musculoskeletal: Positive for arthralgias, back pain, gait problem, myalgias, neck pain and neck stiffness.  Skin: Negative.   Neurological: Positive for dizziness, weakness, numbness and headaches.       Tingling neuropathy  Psychiatric/Behavioral: Positive for confusion. The patient is nervous/anxious.   All other systems reviewed and are negative.      Objective:   Physical Exam   General: Alert and oriented x 3, NAD.  Morbidly obese. HEENT: Normocephalic.  Atraumatic.  EOMI.  PERRLA, sclera anicteric, oral mucosa pink and moist, dentition intact, ext ear canals clear,  Neck: Supple without JVD  or lymphadenopathy.  No tracheal deviation Heart: Reg rate and rhythm. No murmurs rubs or gallops Chest: CTA bilaterally without wheezes, rales, or rhonchi; no distress Abdomen: Soft, non-tender, nondistended, bowel sounds positive. Extremities: No clubbing, cyanosis, or edema. Pulses are  2+ Skin: Clean and intact without signs of breakdown Neuro: Pt is cognitively appropriate with normal insight, memory, and awareness. Cranial nerves 2-12 are intact. Sensory exam is normal. Reflexes are 2+ in all 4's. Fine motor coordination is intact. No tremors. Motor function is grossly 5/5 except 4/5 in bilateral lower extremities.  Musculoskeletal: Limited ROM in all planes of neck movement except extension, which is full. Poor posture. Walks with cane.   Psych: Pt's affect is appropriate. Pt is cooperative     Assessment & Plan:  Dana Bradley is a 48 year old woman with chronic intractable migraines s/p numerous treatments, severe fibromyalgia, and IBS. She presents today to see if she is a good candidate for Botox for her migraines.  Injections:  Trigger Point injections performed today. After informed consent and preparation of the skin with isopropyl alcohol, injection of the left trapezius in 4 trigger points with 2cc of 1% lidocaine. The patient tolerated well, and no complications were experienced. Post-injection instructions were provided.  Dana Bradley would be a good candidate for Botox injections given her severe chronic migraines that have failed multiple treatments. Discussed risks and benefits of Botox injections-- risk or respiratory failure, bleeding, bruising, drooping of eyelids, bleeding, bruising, nerve damage. Discussed the benefits: 3 month duration of treatment She will discontinue Emgality and we will schedule her for Botox injections with Dr. Posey Pronto.  Physical Therapy: For cervical myofascial pain syndrome: myofascial release, postural correction, stretching and strengthening of the muscles of the neck and upper back, development of HEP.   Exercise: Discussed that exercise is one of the most effective treatments for fibromyalgia. This will also help with her obesity. Made goal with Dana Bradley to walk outside her home at least once per day, and to garden at least once per  day (her favorite activity). Can use elliptical which she has at home on rainy days.   Quality of Life: Discussed that Dana Bradley's greatest source of happiness is her family. This community will be essential in helping her recover from her chronic pain and to increase her daily activity.   Greater than Forty-five minutes of face to face patient care between myself and Dr. Adam Phenix.  Time were spent during this visit. All questions were encouraged and answered.  Follow-up in 2 weeks for Botox injection and then Dr. Adam Phenix in 4 weeks.

## 2019-07-30 NOTE — Patient Instructions (Signed)
The Next Day or Two Some patients feel more pain after being injected.  This is normal, and it will go away soon.  Rest for the first day or two. You don't need to stay in bed.  But avoid tasks that may strain the injected muscles.   Risks and Complications Being injected with lidocaine has certain risks and possible complications.  These include:   Briefly increased pain  You may apply ice or heat to your muscles if soreness persists.

## 2019-08-01 ENCOUNTER — Other Ambulatory Visit: Payer: Self-pay

## 2019-08-01 ENCOUNTER — Ambulatory Visit
Admission: RE | Admit: 2019-08-01 | Discharge: 2019-08-01 | Disposition: A | Discharge status: Home / Self Care | Payer: Self-pay | Source: Ambulatory Visit | Attending: Pain Medicine | Admitting: Pain Medicine

## 2019-08-01 ENCOUNTER — Other Ambulatory Visit
Admission: RE | Admit: 2019-08-01 | Discharge: 2019-08-01 | Disposition: A | Discharge status: Home / Self Care | Payer: Medicaid Other | Attending: Family Medicine | Admitting: Family Medicine

## 2019-08-01 ENCOUNTER — Other Ambulatory Visit: Admission: RE | Admit: 2019-08-01 | Payer: Self-pay | Source: Home / Self Care | Admitting: *Deleted

## 2019-08-01 DIAGNOSIS — R109 Unspecified abdominal pain: Secondary | ICD-10-CM | POA: Insufficient documentation

## 2019-08-01 DIAGNOSIS — Z13220 Encounter for screening for lipoid disorders: Secondary | ICD-10-CM

## 2019-08-01 DIAGNOSIS — R197 Diarrhea, unspecified: Secondary | ICD-10-CM

## 2019-08-01 DIAGNOSIS — E538 Deficiency of other specified B group vitamins: Secondary | ICD-10-CM

## 2019-08-01 DIAGNOSIS — M5134 Other intervertebral disc degeneration, thoracic region: Secondary | ICD-10-CM | POA: Insufficient documentation

## 2019-08-01 DIAGNOSIS — R1011 Right upper quadrant pain: Secondary | ICD-10-CM | POA: Insufficient documentation

## 2019-08-01 DIAGNOSIS — E559 Vitamin D deficiency, unspecified: Secondary | ICD-10-CM

## 2019-08-01 DIAGNOSIS — M5414 Radiculopathy, thoracic region: Secondary | ICD-10-CM | POA: Insufficient documentation

## 2019-08-01 LAB — COMPREHENSIVE METABOLIC PANEL
ALT: 42 U/L (ref 0–44)
AST: 27 U/L (ref 15–41)
Albumin: 3.8 g/dL (ref 3.5–5.0)
Alkaline Phosphatase: 92 U/L (ref 38–126)
Anion gap: 6 (ref 5–15)
BUN: 10 mg/dL (ref 6–20)
CO2: 28 mmol/L (ref 22–32)
Calcium: 9.2 mg/dL (ref 8.9–10.3)
Chloride: 105 mmol/L (ref 98–111)
Creatinine, Ser: 0.68 mg/dL (ref 0.44–1.00)
GFR calc Af Amer: >60 mL/min (ref >60)
GFR calc non Af Amer: >60 mL/min (ref >60)
Glucose, Bld: 137 mg/dL — ABNORMAL HIGH (ref 70–99)
Potassium: 3.5 mmol/L (ref 3.5–5.1)
Sodium: 139 mmol/L (ref 135–145)
Total Bilirubin: 0.8 mg/dL (ref 0.3–1.2)
Total Protein: 7 g/dL (ref 6.5–8.1)

## 2019-08-01 LAB — CBC WITH DIFFERENTIAL/PLATELET
Abs Immature Granulocytes: 0.06 10*3/uL (ref 0.00–0.07)
Basophils Absolute: 0.1 10*3/uL (ref 0.0–0.1)
Basophils Relative: 1 %
Eosinophils Absolute: 0.1 10*3/uL (ref 0.0–0.5)
Eosinophils Relative: 2 %
HCT: 41.5 % (ref 36.0–46.0)
Hemoglobin: 13.7 g/dL (ref 12.0–15.0)
Immature Granulocytes: 1 %
Lymphocytes Relative: 30 %
Lymphs Abs: 2.2 10*3/uL (ref 0.7–4.0)
MCH: 31.6 pg (ref 26.0–34.0)
MCHC: 33 g/dL (ref 30.0–36.0)
MCV: 95.6 fL (ref 80.0–100.0)
Monocytes Absolute: 0.7 10*3/uL (ref 0.1–1.0)
Monocytes Relative: 9 %
Neutro Abs: 4.3 10*3/uL (ref 1.7–7.7)
Neutrophils Relative %: 57 %
Platelets: 250 10*3/uL (ref 150–400)
RBC: 4.34 MIL/uL (ref 3.87–5.11)
RDW: 12.7 % (ref 11.5–15.5)
WBC: 7.3 10*3/uL (ref 4.0–10.5)
nRBC: 0 % (ref 0.0–0.2)

## 2019-08-01 LAB — LIPID PANEL
Cholesterol: 158 mg/dL (ref 0–200)
HDL: 35 mg/dL — ABNORMAL LOW (ref >40)
LDL Cholesterol: 51 mg/dL (ref 0–99)
Total CHOL/HDL Ratio: 4.5 RATIO
Triglycerides: 358 mg/dL — ABNORMAL HIGH (ref <150)
VLDL: 72 mg/dL — ABNORMAL HIGH (ref 0–40)

## 2019-08-02 ENCOUNTER — Ambulatory Visit: Payer: Self-pay | Attending: Orthopaedic Surgery

## 2019-08-02 ENCOUNTER — Other Ambulatory Visit: Payer: Self-pay | Admitting: Family Medicine

## 2019-08-02 DIAGNOSIS — M542 Cervicalgia: Secondary | ICD-10-CM | POA: Insufficient documentation

## 2019-08-02 DIAGNOSIS — M6281 Muscle weakness (generalized): Secondary | ICD-10-CM | POA: Insufficient documentation

## 2019-08-02 DIAGNOSIS — M7702 Medial epicondylitis, left elbow: Secondary | ICD-10-CM | POA: Insufficient documentation

## 2019-08-02 DIAGNOSIS — R7301 Impaired fasting glucose: Secondary | ICD-10-CM

## 2019-08-02 LAB — VITAMIN D 25 HYDROXY (VIT D DEFICIENCY, FRACTURES): Vit D, 25-Hydroxy: 12.48 ng/mL — ABNORMAL LOW (ref 30–100)

## 2019-08-02 MED ORDER — VITAMIN D (ERGOCALCIFEROL) 1.25 MG (50000 UNIT) PO CAPS: 50000.0000 [IU] | ORAL_CAPSULE | ORAL | 0 refills | Status: DC
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
## 2019-08-03 ENCOUNTER — Ambulatory Visit: Payer: Medicaid Other | Admitting: Pain Medicine

## 2019-08-03 ENCOUNTER — Telehealth: Payer: Self-pay | Admitting: Family Medicine

## 2019-08-03 ENCOUNTER — Other Ambulatory Visit: Payer: Self-pay

## 2019-08-03 ENCOUNTER — Ambulatory Visit (HOSPITAL_BASED_OUTPATIENT_CLINIC_OR_DEPARTMENT_OTHER): Payer: Self-pay | Admitting: Pain Medicine

## 2019-08-03 ENCOUNTER — Other Ambulatory Visit: Payer: Self-pay | Admitting: Emergency Medicine

## 2019-08-03 ENCOUNTER — Ambulatory Visit
Admission: RE | Admit: 2019-08-03 | Discharge: 2019-08-03 | Disposition: A | Discharge status: Home / Self Care | Payer: Self-pay | Source: Ambulatory Visit | Attending: Pain Medicine | Admitting: Pain Medicine

## 2019-08-03 ENCOUNTER — Telehealth: Payer: Self-pay

## 2019-08-03 VITALS — BP 103/64 | HR 72 | Temp 98.3°F | Resp 15 | Ht 63.0 in | Wt 218.0 lb

## 2019-08-03 DIAGNOSIS — R55 Syncope and collapse: Secondary | ICD-10-CM

## 2019-08-03 DIAGNOSIS — G8929 Other chronic pain: Secondary | ICD-10-CM

## 2019-08-03 DIAGNOSIS — M62838 Other muscle spasm: Secondary | ICD-10-CM

## 2019-08-03 DIAGNOSIS — M19011 Primary osteoarthritis, right shoulder: Secondary | ICD-10-CM

## 2019-08-03 DIAGNOSIS — Z91041 Radiographic dye allergy status: Secondary | ICD-10-CM

## 2019-08-03 DIAGNOSIS — M9904 Segmental and somatic dysfunction of sacral region: Secondary | ICD-10-CM

## 2019-08-03 DIAGNOSIS — Z91040 Latex allergy status: Secondary | ICD-10-CM

## 2019-08-03 DIAGNOSIS — M533 Sacrococcygeal disorders, not elsewhere classified: Secondary | ICD-10-CM

## 2019-08-03 DIAGNOSIS — M25552 Pain in left hip: Secondary | ICD-10-CM

## 2019-08-03 DIAGNOSIS — M5388 Other specified dorsopathies, sacral and sacrococcygeal region: Secondary | ICD-10-CM | POA: Insufficient documentation

## 2019-08-03 DIAGNOSIS — M1612 Unilateral primary osteoarthritis, left hip: Secondary | ICD-10-CM | POA: Insufficient documentation

## 2019-08-03 DIAGNOSIS — M19012 Primary osteoarthritis, left shoulder: Secondary | ICD-10-CM

## 2019-08-03 MED ORDER — FENTANYL CITRATE (PF) 100 MCG/2ML IJ SOLN
INTRAMUSCULAR | Status: AC
  Filled 2019-08-03: qty 2

## 2019-08-03 MED ORDER — DEXILANT 30 MG PO CPDR: 30.0000 mg | DELAYED_RELEASE_CAPSULE | Freq: Every day | ORAL | 2 refills | Status: DC

## 2019-08-03 MED ORDER — BACLOFEN 10 MG PO TABS: 10.0000 mg | ORAL_TABLET | Freq: Three times a day (TID) | ORAL | 5 refills | Status: DC

## 2019-08-03 MED ORDER — GLYCOPYRROLATE 0.2 MG/ML IJ SOLN
INTRAMUSCULAR | Status: AC
  Filled 2019-08-03: qty 1

## 2019-08-03 MED ORDER — METHYLPREDNISOLONE ACETATE 80 MG/ML IJ SUSP
80.0000 mg | Freq: Once | INTRAMUSCULAR | Status: AC
  Administered 2019-08-03: 80 mg via INTRA_ARTICULAR

## 2019-08-03 MED ORDER — FENTANYL CITRATE (PF) 100 MCG/2ML IJ SOLN
25.0000 ug | INTRAMUSCULAR | Status: DC | PRN
  Administered 2019-08-03: 50 ug via INTRAVENOUS

## 2019-08-03 MED ORDER — LIDOCAINE HCL 2 % IJ SOLN
INTRAMUSCULAR | Status: AC
  Filled 2019-08-03: qty 20

## 2019-08-03 MED ORDER — DICLOFENAC SODIUM 1 % TD GEL: 2.0000 g | Freq: Four times a day (QID) | TRANSDERMAL | 3 refills | Status: DC

## 2019-08-03 MED ORDER — ROPIVACAINE HCL 2 MG/ML IJ SOLN: 9.0000 mL | Freq: Once | INTRAMUSCULAR | Status: DC

## 2019-08-03 MED ORDER — MIDAZOLAM HCL 5 MG/5ML IJ SOLN
INTRAMUSCULAR | Status: AC
  Filled 2019-08-03: qty 5

## 2019-08-03 MED ORDER — ROPIVACAINE HCL 2 MG/ML IJ SOLN
4.0000 mL | Freq: Once | INTRAMUSCULAR | Status: AC
  Administered 2019-08-03: 4 mL via INTRA_ARTICULAR

## 2019-08-03 MED ORDER — LACTATED RINGERS IV SOLN
1000.0000 mL | Freq: Once | INTRAVENOUS | Status: AC
  Administered 2019-08-03: 1000 mL via INTRAVENOUS

## 2019-08-03 MED ORDER — GLYCOPYRROLATE 0.2 MG/ML IJ SOLN
0.2000 mg | Freq: Once | INTRAMUSCULAR | Status: AC
  Administered 2019-08-03: 0.2 mg via INTRAVENOUS

## 2019-08-03 MED ORDER — MIDAZOLAM HCL 5 MG/5ML IJ SOLN
1.0000 mg | INTRAMUSCULAR | Status: DC | PRN
  Administered 2019-08-03: 2 mg via INTRAVENOUS

## 2019-08-03 MED ORDER — LIDOCAINE HCL 2 % IJ SOLN
20.0000 mL | Freq: Once | INTRAMUSCULAR | Status: AC
  Administered 2019-08-03: 400 mg

## 2019-08-03 MED ORDER — ROPIVACAINE HCL 2 MG/ML IJ SOLN
INTRAMUSCULAR | Status: AC
  Filled 2019-08-03: qty 20

## 2019-08-03 MED ORDER — METHYLPREDNISOLONE ACETATE 80 MG/ML IJ SUSP
INTRAMUSCULAR | Status: AC
  Filled 2019-08-03: qty 2

## 2019-08-03 NOTE — Patient Instructions (Signed)

## 2019-08-03 NOTE — Telephone Encounter (Signed)
Order sent to [facility] for refills

## 2019-08-03 NOTE — Telephone Encounter (Signed)
Pt states Rx for Baclofen was sent to [place] and needs to be sent to [facility] here at [facility](we have sent to them before) also Voltran if prescribed.

## 2019-08-03 NOTE — Telephone Encounter (Signed)
I attempted to notify patient that Baclofen and Voltaren have been sent to [facility]. No answer, unable to leave a voicemail.

## 2019-08-03 NOTE — Addendum Note (Signed)
Addended by: Milinda Pointer A on: 08/03/2019 12:51 PM   Modules accepted: Orders

## 2019-08-03 NOTE — Telephone Encounter (Signed)
Pt called and stated that medication Vitamin D, Ergocalciferol, (DRISDOL) 1.25 MG (50000 UT) CAPS capsule [id number] was sent to wrong pharmacy and should be sent to [facility] [place] [address]  [phone] Phone#[phone]   Pt also would like medication Dexlansoprazole (DEXILANT) 30 MG capsule [id number] changed to Omprezole because pt can not get Dexilant.

## 2019-08-03 NOTE — Progress Notes (Addendum)
Patient's Name: Dana Bradley  MRN: [id number]  Referring Provider: Hubbard Hartshorn, FNP  DOB: Jan 09, 1971  PCP: Hubbard Hartshorn, FNP  DOS: 08/03/2019  Note by: Gaspar Cola, MD  Service setting: Ambulatory outpatient  Specialty: Interventional Pain Management  Patient type: Established  Location: [facility] (AMB) [facility] Facility  Visit type: Interventional Procedure   Primary Reason for Visit: Interventional Pain Management Treatment. CC: Back Pain (lower, worse on left)  Procedure #1:  Anesthesia, Analgesia, Anxiolysis:  Type: Diagnostic Sacroiliac Joint Steroid Injection #1  Region: Superior Lumbosacral Region Level: PSIS (Posterior Superior Iliac Spine) Laterality: Left-Side  Type: Moderate (Conscious) Sedation combined with Local Anesthesia Indication(s): Analgesia and Anxiety Route: Intravenous (IV) IV Access: Secured Sedation: Meaningful verbal contact was maintained at all times during the procedure  Local Anesthetic: Lidocaine 1-2%  Position: Prone           Indications: 1. Chronic sacroiliac joint pain (Left)   2. Somatic dysfunction of sacroiliac joint (Bilateral)   3. Other specified dorsopathies, sacral and sacrococcygeal region   4. History of vasovagal episode   5. Latex precautions, history of latex allergy   6. History of allergy to radiographic contrast media    Procedure #2:  Anesthesia, Analgesia, Anxiolysis:  Type: Intra-Articular Hip Injection #1  Primary Purpose: Diagnostic Region: Posterolateral hip joint area. Level: Lower pelvic and hip joint level. Target Area: Superior aspect of the hip joint cavity, going thru the superior portion of the capsular ligament. Approach: Posterolateral approach. Laterality: Left-Sided  Type: Moderate (Conscious) Sedation combined with Local Anesthesia Indication(s): Analgesia and Anxiety Route: Intravenous (IV) IV Access: Secured Sedation: Meaningful verbal contact was maintained at all times during the procedure   Local Anesthetic: Lidocaine 1-2%  Position: Prone Prepped Area: Entire Posterolateral hip area. Prepping solution: DuraPrep (Iodine Povacrylex [0.7% available iodine] and Isopropyl Alcohol, 74% w/w)   Indications: 1. Chronic hip pain (Left)   2. Osteoarthritis of hip (Left)    Pain Score: Pre-procedure: 4 /10 Post-procedure: 2 /10   Please see the 07/21/2019 for plan and reasoning.  Pre-op Assessment:  Dana Bradley is a 48 y.o. (year old), female patient, seen today for interventional treatment. She  has a past surgical history that includes Ablation; Tubal ligation (10/01/99); Cardiac catheterization; Knee arthroscopy; Colonoscopy with propofol (N/A, 05/17/2015); Esophagogastroduodenoscopy (N/A, 05/17/2015); spg; and Tibial Tubercle Bypass (Right, 1998). Dana Bradley has a current medication list which includes the following prescription(s): acetaminophen, atorvastatin, vitamin b-12, dexlansoprazole, dicyclomine, emgality, hydrocodone-acetaminophen, magnesium oxide, melatonin, metoprolol tartrate, ondansetron, pregabalin, triamcinolone cream, valerian, vitamin d (ergocalciferol), baclofen, and diclofenac sodium, and the following Facility-Administered Medications: fentanyl, midazolam, and ropivacaine (pf) 2 mg/ml (0.2%). Her primarily concern today is the Back Pain (lower, worse on left)  Initial Vital Signs:  Pulse/HCG Rate: 86ECG Heart Rate: 86 Temp: 99.5 F (37.5 C) Resp: 16 BP: 125/84 SpO2: 99 %  BMI: Estimated body mass index is 38.62 kg/m as calculated from the following:   Height as of this encounter: 5\' 3"  (1.6 m).   Weight as of this encounter: 218 lb (98.9 kg).  Risk Assessment: Allergies: Reviewed. She is allergic to aspirin; cymbalta [duloxetine hcl]; depakote [divalproex sodium]; gadolinium derivatives; haloperidol; meperidine; reglan [metoclopramide]; tramadol hcl; trazodone; compazine [prochlorperazine]; meloxicam; penicillins; tomato; other; shellfish allergy;  shellfish-derived products; bacitracin-neomycin-polymyxin; cephalosporins; ibuprofen; latex; neosporin [neomycin-bacitracin zn-polymyx]; nsaids; sulfa antibiotics; sulfasalazine; and sulfonamide derivatives.  Allergy Precautions: None required Coagulopathies: Reviewed. None identified.  Blood-thinner therapy: None at this time Active Infection(s): Reviewed. None identified. Dana Bradley is  afebrile  Site Confirmation: Dana Bradley was asked to confirm the procedure and laterality before marking the site Procedure checklist: Completed Consent: Before the procedure and under the influence of no sedative(s), amnesic(s), or anxiolytics, the patient was informed of the treatment options, risks and possible complications. To fulfill our ethical and legal obligations, as recommended by the American Medical Association's Code of Ethics, I have informed the patient of my clinical impression; the nature and purpose of the treatment or procedure; the risks, benefits, and possible complications of the intervention; the alternatives, including doing nothing; the risk(s) and benefit(s) of the alternative treatment(s) or procedure(s); and the risk(s) and benefit(s) of doing nothing. The patient was provided information about the general risks and possible complications associated with the procedure. These may include, but are not limited to: failure to achieve desired goals, infection, bleeding, organ or nerve damage, allergic reactions, paralysis, and death. In addition, the patient was informed of those risks and complications associated to the procedure, such as failure to decrease pain; infection; bleeding; organ or nerve damage with subsequent damage to sensory, motor, and/or autonomic systems, resulting in permanent pain, numbness, and/or weakness of one or several areas of the body; allergic reactions; (i.e.: anaphylactic reaction); and/or death. Furthermore, the patient was informed of those risks and complications  associated with the medications. These include, but are not limited to: allergic reactions (i.e.: anaphylactic or anaphylactoid reaction(s)); adrenal axis suppression; blood sugar elevation that in diabetics may result in ketoacidosis or comma; water retention that in patients with history of congestive heart failure may result in shortness of breath, pulmonary edema, and decompensation with resultant heart failure; weight gain; swelling or edema; medication-induced neural toxicity; particulate matter embolism and blood vessel occlusion with resultant organ, and/or nervous system infarction; and/or aseptic necrosis of one or more joints. Finally, the patient was informed that Medicine is not an exact science; therefore, there is also the possibility of unforeseen or unpredictable risks and/or possible complications that may result in a catastrophic outcome. The patient indicated having understood very clearly. We have given the patient no guarantees and we have made no promises. Enough time was given to the patient to ask questions, all of which were answered to the patient's satisfaction. Ms. Foti has indicated that she wanted to continue with the procedure. Attestation: I, the ordering provider, attest that I have discussed with the patient the benefits, risks, side-effects, alternatives, likelihood of achieving goals, and potential problems during recovery for the procedure that I have provided informed consent. Date  Time: 08/03/2019  8:10 AM  Pre-Procedure Preparation:  Monitoring: As per clinic protocol. Respiration, ETCO2, SpO2, BP, heart rate and rhythm monitor placed and checked for adequate function Safety Precautions: Patient was assessed for positional comfort and pressure points before starting the procedure. Time-out: I initiated and conducted the "Time-out" before starting the procedure, as per protocol. The patient was asked to participate by confirming the accuracy of the "Time Out"  information. Verification of the correct person, site, and procedure were performed and confirmed by me, the nursing staff, and the patient. "Time-out" conducted as per Joint Commission's Universal Protocol (UP.01.01.01). Time: 0850  Description of Procedure #1:  Target Area: Superior, posterior, aspect of the sacroiliac fissure Approach: Posterior, paraspinal, ipsilateral approach. Area Prepped: Entire Lower Lumbosacral Region Prepping solution: DuraPrep (Iodine Povacrylex [0.7% available iodine] and Isopropyl Alcohol, 74% w/w) Safety Precautions: Aspiration looking for blood return was conducted prior to all injections. At no point did we inject any substances, as a  needle was being advanced. No attempts were made at seeking any paresthesias. Safe injection practices and needle disposal techniques used. Medications properly checked for expiration dates. SDV (single dose vial) medications used. Description of the Procedure: Protocol guidelines were followed. The patient was placed in position over the procedure table. The target area was identified and the area prepped in the usual manner. Skin & deeper tissues infiltrated with local anesthetic. Appropriate amount of time allowed to pass for local anesthetics to take effect. The procedure needle was advanced under fluoroscopic guidance into the sacroiliac joint until a firm endpoint was obtained. Proper needle placement secured. Negative aspiration confirmed. Solution injected in intermittent fashion, asking for systemic symptoms every 0.5cc of injectate. The needles were then removed and the area cleansed, making sure to leave some of the prepping solution back to take advantage of its long term bactericidal properties.  Vitals:   08/03/19 0900 08/03/19 0910 08/03/19 0920 08/03/19 0930  BP: (!) 114/96 (!) 99/50 98/65 103/64  Pulse: 72     Resp: 12 15 16 15   Temp:  98.8 F (37.1 C)  98.3 F (36.8 C)  TempSrc:      SpO2: 96% 94% 96% 98%  Weight:       Height:         Start Time: 0850 hrs.  Materials:  Needle(s) Type: Spinal Needle Gauge: 22G Length: 3.5-in Medication(s): Please see orders for medications and dosing details.  Imaging Guidance (Non-Spinal) for procedure #1:  Type of Imaging Technique: Fluoroscopy Guidance (Non-Spinal) Indication(s): Assistance in needle guidance and placement for procedures requiring needle placement in or near specific anatomical locations not easily accessible without such assistance. Exposure Time: Please see nurses notes. Contrast: Before injecting any contrast, we confirmed that the patient did not have an allergy to iodine, shellfish, or radiological contrast. Once satisfactory needle placement was completed at the desired level, radiological contrast was injected. Contrast injected under live fluoroscopy. No contrast complications. See chart for type and volume of contrast used. Fluoroscopic Guidance: I was personally present during the use of fluoroscopy. "Tunnel Vision Technique" used to obtain the best possible view of the target area. Parallax error corrected before commencing the procedure. "Direction-depth-direction" technique used to introduce the needle under continuous pulsed fluoroscopy. Once target was reached, antero-posterior, oblique, and lateral fluoroscopic projection used confirm needle placement in all planes. Images permanently stored in EMR. Interpretation: I personally interpreted the imaging intraoperatively. Adequate needle placement confirmed in multiple planes. Appropriate spread of contrast into desired area was observed. No evidence of afferent or efferent intravascular uptake. Permanent images saved into the patient's record.  Description of Procedure #2:  Safety Precautions: Aspiration looking for blood return was conducted prior to all injections. At no point did we inject any substances, as a needle was being advanced. No attempts were made at seeking any paresthesias.  Safe injection practices and needle disposal techniques used. Medications properly checked for expiration dates. SDV (single dose vial) medications used. Description of the Procedure: Protocol guidelines were followed. The patient was placed in position over the fluoroscopy table. The target area was identified and the area prepped in the usual manner. Skin & deeper tissues infiltrated with local anesthetic. Appropriate amount of time allowed to pass for local anesthetics to take effect. The procedure needles were then advanced to the target area. Proper needle placement secured. Negative aspiration confirmed. Solution injected in intermittent fashion, asking for systemic symptoms every 0.5cc of injectate. The needles were then removed and the area cleansed,  making sure to leave some of the prepping solution back to take advantage of its long term bactericidal properties.  Vitals:   08/03/19 0900 08/03/19 0910 08/03/19 0920 08/03/19 0930  BP: (!) 114/96 (!) 99/50 98/65 103/64  Pulse: 72     Resp: 12 15 16 15   Temp:  98.8 F (37.1 C)  98.3 F (36.8 C)  TempSrc:      SpO2: 96% 94% 96% 98%  Weight:      Height:         End Time: 0900 hrs.  Materials:  Needle(s) Type: Spinal Needle Gauge: 22G Length: 5.0-in Medication(s): Please see orders for medications and dosing details.  Imaging Guidance (Non-Spinal) for procedure #2:  Type of Imaging Technique: Fluoroscopy Guidance (Non-Spinal) Indication(s): Assistance in needle guidance and placement for procedures requiring needle placement in or near specific anatomical locations not easily accessible without such assistance. Exposure Time: Please see nurses notes. Contrast: Before injecting any contrast, we confirmed that the patient did not have an allergy to iodine, shellfish, or radiological contrast. Once satisfactory needle placement was completed at the desired level, radiological contrast was injected. Contrast injected under live  fluoroscopy. No contrast complications. See chart for type and volume of contrast used. Fluoroscopic Guidance: I was personally present during the use of fluoroscopy. "Tunnel Vision Technique" used to obtain the best possible view of the target area. Parallax error corrected before commencing the procedure. "Direction-depth-direction" technique used to introduce the needle under continuous pulsed fluoroscopy. Once target was reached, antero-posterior, oblique, and lateral fluoroscopic projection used confirm needle placement in all planes. Images permanently stored in EMR. Interpretation: I personally interpreted the imaging intraoperatively. Adequate needle placement confirmed in multiple planes. Appropriate spread of contrast into desired area was observed. No evidence of afferent or efferent intravascular uptake. Permanent images saved into the patient's record.  Antibiotic Prophylaxis:   Anti-infectives (From admission, onward)   None     Indication(s): None identified  Post-operative Assessment:  Post-procedure Vital Signs:  Pulse/HCG Rate: 7278 Temp: 98.3 F (36.8 C) Resp: 15 BP: 103/64 SpO2: 98 %  EBL: None  Complications: No immediate post-treatment complications observed by team, or reported by patient.  Note: The patient tolerated the entire procedure well. A repeat set of vitals were taken after the procedure and the patient was kept under observation following institutional policy, for this type of procedure. Post-procedural neurological assessment was performed, showing return to baseline, prior to discharge. The patient was provided with post-procedure discharge instructions, including a section on how to identify potential problems. Should any problems arise concerning this procedure, the patient was given instructions to immediately contact us, at any time, without hesitation. In any case, we plan to contact the patient by telephone for a follow-up status report regarding  this interventional procedure.  Comments:  No additional relevant information.  Plan of Care  Orders:  Orders Placed This Encounter  Procedures  . SACROILIAC JOINT INJECTION    Scheduling Instructions:     Side: Left-sided     Sedation: With Sedation.     Timeframe: Today    Order Specific Question:   Where will this procedure be performed?    Answer:   [facility]  . HIP INJECTION    Scheduling Instructions:     Side: Left-sided     Sedation: With Sedation.     Timeframe: Today  . [facility] C-ARM 1-60 MIN NO REPORT    Intraoperative interpretation by procedural physician at [facility].    Standing Status:   Standing    Number of Occurrences:   1    Order Specific Question:   Reason for exam:    Answer:   Assistance in needle guidance and placement for procedures requiring needle placement in or near specific anatomical locations not easily accessible without such assistance.  . Informed Consent Details: Physician/Practitioner Attestation; Transcribe to consent form and obtain patient signature    Provider Attestation: I, [place] Dossie Arbour, MD, (Pain Management Specialist), the physician/practitioner, attest that I have discussed with the patient the benefits, risks, side effects, alternatives, likelihood of achieving goals and potential problems during recovery for the procedure that I have provided informed consent.    Scheduling Instructions:     Procedure: Sacroiliac Joint Block     Indication/Reason: Chronic Low Back and Hip Pain secondary to Sacroiliac Joint Pain (Arthralgia/Arthropathy)     Nursing Order: Transcribe to consent form and obtain patient signature.     Note: Always confirm laterality of pain with Ms. Domiano, before procedure.  . Provide equipment / supplies at bedside    Equipment required: Single use, disposable, "Block Tray"    Standing Status:   Standing    Number of Occurrences:   1    Order Specific Question:   Specify     Answer:   Block Tray  . Informed Consent Details: Physician/Practitioner Attestation; Transcribe to consent form and obtain patient signature    Nursing Order: Transcribe to consent form and obtain patient signature. Note: Always confirm laterality of pain with Ms. Skotnicki, before procedure. Procedure: Hip injection Indication/Reason: Hip Joint Pain (Arthralgia) Provider Attestation: I, [place] Doss[place] Arbour, MD, (Pain Management Specialist), the physician/practitioner, attest that I have discussed with the patient the benefits, risks, side effects, alternatives, likelihood of achieving goals and potential problems during recovery for the procedure that I have provided informed consent.  . Miscellanous precautions    Standing Status:   Standing    Number of Occurrences:   1  . Latex precautions    Activate Latex-Free Protocol.    Standing Status:   Standing    Number of Occurrences:   1   Chronic Opioid Analgesic:  Hydrocodone/APAP 7.5/325, 1 tab PO QD (7.5 mg/day of hydrocodone)  MME/day:7.5mg /day.   Medications ordered for procedure: Meds ordered this encounter  Medications  . lidocaine (XYLOCAINE) 2 % (with pres) injection 400 mg  . lactated ringers infusion 1,000 mL  . glycopyrrolate (ROBINUL) injection 0.2 mg  . midazolam (VERSED) 5 MG/5ML injection 1-2 mg    Make sure Flumazenil is available in the pyxis when using this medication. If oversedation occurs, administer 0.2 mg IV over 15 sec. If after 45 sec no response, administer 0.2 mg again over 1 min; may repeat at 1 min intervals; not to exceed 4 doses (1 mg)  . fentaNYL (SUBLIMAZE) injection 25-50 mcg    Make sure Narcan is available in the pyxis when using this medication. In the event of respiratory depression (RR< 8/min): Titrate NARCAN (naloxone) in increments of 0.1 to 0.2 mg IV at 2-3 minute intervals, until desired degree of reversal.  . methylPREDNISolone acetate (DEPO-MEDROL) injection 80 mg  . ropivacaine (PF) 2 mg/mL  (0.2%) (NAROPIN) injection 4 mL  . ropivacaine (PF) 2 mg/mL (0.2%) (NAROPIN) injection 9 mL  . methylPREDNISolone acetate (DEPO-MEDROL) injection 80 mg  . DISCONTD: baclofen (LIORESAL) 10 MG tablet    Sig: Take 1 tablet (10 mg total) by mouth 3 (three) times daily.  Dispense:  90 tablet    Refill:  5    Fill one day early if pharmacy is closed on scheduled refill date. May substitute for generic if available.  . baclofen (LIORESAL) 10 MG tablet    Sig: Take 1 tablet (10 mg total) by mouth 3 (three) times daily.    Dispense:  90 tablet    Refill:  5    Fill one day early if pharmacy is closed on scheduled refill date. May substitute for generic if available.  . diclofenac sodium (VOLTAREN) 1 % GEL    Sig: Apply 2 g topically 4 (four) times daily.    Dispense:  350 g    Refill:  3    Fill one day early if pharmacy is closed on scheduled refill date. May substitute for generic if available.   Medications administered: We administered lidocaine, lactated ringers, glycopyrrolate, midazolam, fentaNYL, methylPREDNISolone acetate, ropivacaine (PF) 2 mg/mL (0.2%), and methylPREDNISolone acetate.  See the medical record for exact dosing, route, and time of administration.  Follow-up plan:   Return in about 2 weeks (around 08/17/2019) for (VV), (PP).       Considering: Possible bilateral lumbar facet RFA. Diagnostic thoracic facet block Possible bilateral thoracic facet RFA. Diagnostic bilateral cervical facet blocks Possible bilateral cervical facet RFA. Diagnostic bilateral lesser occipital NB Possible right occipital nerve RFA. Diagnostic right [id number] Diagnostic bilateral suprascapular NB Diagnostic bilateral IA knee joint injection Possible bilateral IA Hyalgan knee injections.  Diagnostic bilateral Genicular NB Possible bilateral Genicular nerve RFA.   Palliative PRN treatment(s): Diagnostic left SI joint block #2  Diagnostic/therapeutic left IA hip joint  injection #2  Diagnostic bilateral lumbar facet block #2  Palliative left trapezius muscle MNB #4 Palliative right trapezius muscle MNB #2 Palliative left L1-2 LESI #2 Palliative right L1-2 LESI #2 Palliativeleft CESI #3 Palliative left greater occipital nerve block #3  Palliative left C2 + TON nerve block #2  Palliative left GON + C2 + TON RFA #2 (last done 12/25/2017)    Recent Visits Date Type Provider Dept  07/21/19 Office Visit Milinda Pointer, MD [facility]  05/17/19 Office Visit Milinda Pointer, MD [facility]  Showing recent visits within past 90 days and meeting all other requirements   Today's Visits Date Type Provider Dept  08/03/19 Procedure visit Milinda Pointer, MD [facility]  Showing today's visits and meeting all other requirements   Future Appointments Date Type Provider Dept  08/23/19 Appointment Milinda Pointer, MD [facility]  Showing future appointments within next 90 days and meeting all other requirements   Disposition: Discharge home  Discharge Date & Time: 08/03/2019; 0931 hrs.   Primary Care Physician: Hubbard Hartshorn, FNP Location: [facility] Note by: Gaspar Cola, MD Date: 08/03/2019; Time: 12:51 PM  Disclaimer:  Medicine is not an Chief Strategy Officer. The only guarantee in medicine is that nothing is guaranteed. It is important to note that the decision to proceed with this intervention was based on the information collected from the patient. The Data and conclusions were drawn from the patient's questionnaire, the interview, and the physical examination. Because the information was provided in large part by the patient, it cannot be guaranteed that it has not been purposely or unconsciously manipulated. Every effort has been made to obtain as much relevant data as possible for this evaluation. It is important to note that the conclusions that lead to this procedure  are derived in large part from the  available data. Always take into account that the treatment will also be dependent on availability of resources and existing treatment guidelines, considered by other Pain Management Practitioners as being common knowledge and practice, at the time of the intervention. For Medico-Legal purposes, it is also important to point out that variation in procedural techniques and pharmacological choices are the acceptable norm. The indications, contraindications, technique, and results of the above procedure should only be interpreted and judged by a Board-Certified Interventional Pain Specialist with extensive familiarity and expertise in the same exact procedure and technique.

## 2019-08-03 NOTE — Telephone Encounter (Signed)
I sent a bubble message to Dr. Dossie Arbour.

## 2019-08-04 ENCOUNTER — Telehealth: Payer: Self-pay

## 2019-08-04 ENCOUNTER — Ambulatory Visit: Payer: Self-pay

## 2019-08-04 ENCOUNTER — Other Ambulatory Visit
Admission: RE | Admit: 2019-08-04 | Discharge: 2019-08-04 | Disposition: A | Discharge status: Home / Self Care | Payer: Medicaid Other | Source: Ambulatory Visit | Attending: Family Medicine | Admitting: Family Medicine

## 2019-08-04 DIAGNOSIS — R7301 Impaired fasting glucose: Secondary | ICD-10-CM | POA: Insufficient documentation

## 2019-08-04 LAB — HEMOGLOBIN A1C
Hgb A1c MFr Bld: 5.8 % — ABNORMAL HIGH (ref 4.8–5.6)
Mean Plasma Glucose: 119.76 mg/dL

## 2019-08-04 NOTE — Telephone Encounter (Signed)
Post procedure phone call.  Patient stataes she is doing good.  States seh is still having some pain but the pain in pelvis is better.  Instructed to call if needed.

## 2019-08-05 ENCOUNTER — Telehealth: Payer: Self-pay

## 2019-08-05 ENCOUNTER — Telehealth: Payer: Self-pay | Admitting: Physical Medicine & Rehabilitation

## 2019-08-05 NOTE — Telephone Encounter (Signed)
Looks like a Scientist, research (physical sciences) pt

## 2019-08-05 NOTE — Telephone Encounter (Signed)
Copied from [place] [id number]. Topic: Referral - Question >> Aug 05, 2019  9:54 AM Rayann Heman wrote: Reason for CRM: pt called and stated that she is still waiting on a call to schedule Imaging for stomach pain. Pt would also like a call back regarding lab results.  Please advise   I contacted this patient to inform her that she has been scheduled to have her u/s on Thursday, November 12th @ 11am at the [facility] off of [address]. I was unable to review labs due to her being on the phone with Medication Management. She stated that she would call back.

## 2019-08-05 NOTE — Telephone Encounter (Signed)
Patient calling wanting to know if sample Botox can be used for her appt 08/10/19. If not, she will need to cancel appointment.

## 2019-08-06 ENCOUNTER — Other Ambulatory Visit
Admission: RE | Admit: 2019-08-06 | Discharge: 2019-08-06 | Disposition: A | Discharge status: Home / Self Care | Payer: Medicaid Other | Source: Ambulatory Visit | Attending: Family Medicine | Admitting: Family Medicine

## 2019-08-06 DIAGNOSIS — R197 Diarrhea, unspecified: Secondary | ICD-10-CM | POA: Insufficient documentation

## 2019-08-06 DIAGNOSIS — R109 Unspecified abdominal pain: Secondary | ICD-10-CM | POA: Insufficient documentation

## 2019-08-06 DIAGNOSIS — R1011 Right upper quadrant pain: Secondary | ICD-10-CM | POA: Insufficient documentation

## 2019-08-06 LAB — GASTROINTESTINAL PANEL BY PCR, STOOL (REPLACES STOOL CULTURE)

## 2019-08-06 NOTE — Telephone Encounter (Signed)
According to his 07/30/19 note this is for Botox. I have left her a message informing her of this information. Her home VM never picked up but her mobile with her name identified VM did.

## 2019-08-09 ENCOUNTER — Other Ambulatory Visit: Payer: Self-pay

## 2019-08-09 ENCOUNTER — Ambulatory Visit: Payer: Self-pay

## 2019-08-09 DIAGNOSIS — M7702 Medial epicondylitis, left elbow: Secondary | ICD-10-CM

## 2019-08-09 DIAGNOSIS — M542 Cervicalgia: Secondary | ICD-10-CM

## 2019-08-09 DIAGNOSIS — M6281 Muscle weakness (generalized): Secondary | ICD-10-CM

## 2019-08-09 NOTE — Telephone Encounter (Signed)
Contacted patient and informed that a Botox sample will be used
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
## 2019-08-09 NOTE — Telephone Encounter (Signed)
Yes, this will be a sample.  Thanks.

## 2019-08-10 ENCOUNTER — Encounter: Payer: Self-pay | Attending: Physical Medicine & Rehabilitation | Admitting: Physical Medicine & Rehabilitation

## 2019-08-10 ENCOUNTER — Other Ambulatory Visit: Payer: Self-pay

## 2019-08-10 ENCOUNTER — Encounter: Payer: Self-pay | Admitting: Physical Medicine & Rehabilitation

## 2019-08-10 VITALS — BP 122/80 | HR 81 | Temp 97.5°F | Ht 63.0 in | Wt 215.0 lb

## 2019-08-10 DIAGNOSIS — G43119 Migraine with aura, intractable, without status migrainosus: Secondary | ICD-10-CM

## 2019-08-10 DIAGNOSIS — M797 Fibromyalgia: Secondary | ICD-10-CM | POA: Insufficient documentation

## 2019-08-10 DIAGNOSIS — M7918 Myalgia, other site: Secondary | ICD-10-CM | POA: Insufficient documentation

## 2019-08-10 DIAGNOSIS — Z6838 Body mass index (BMI) 38.0-38.9, adult: Secondary | ICD-10-CM | POA: Insufficient documentation

## 2019-08-10 NOTE — Patient Instructions (Signed)
The side effects of botulism toxin use for chronic migraine may include:  -Transient, and usually mild, facial weakness with facial injections -Transient, and usually mild, head or neck weakness with head/neck injections -Reduction or loss of forehead facial animation due to forehead muscle weakness -Eyelid drooping -Dry eye -Pain at the site of injection or bruising at the site of injection -Double vision -Potential unknown long term risks

## 2019-08-10 NOTE — Progress Notes (Addendum)
Botox Injection for chronic migraine headaches  Dilution: 100 Units/ 60ml preservative free NS x2 (Samples used) Indication: refractory headaches (At least 15 days per month/headache lasting greater than 4 hours per day) incompletely responsive to other more conservative measures.  Informed consent was obtained after describing risks and benefits of the procedure with the patient. This includes bleeding, bruising, infection, excessive weakness, or medication side effects. A REMS form is on file and signed. Needle: 30g 1/2 inch needle  HPI: Female with chronic intractable migraines presents for Botox injections for migraines.  Please see previous note along with Dr. Lissa Morales notes.   Number of units per muscle:  Right temporalis 20 units, 4 access points Left temporalis 20 units,  4 access points Right frontalis 10 units, 2 access points Left frontalis 10 units, 2 access points Procerus 5 units, 1 access point Right corrugator 5 units, 1 access point Left corrugator 5 units, 1 access point Right occipitalis 15 units, 3 access points Left occipitalis 30 units, 3 access points Right cervical paraspinal 10 units, 2 access points Left cervical paraspinals 20 units, 2 access points  Right trapezius 15 units, 3 access points Left trapezius 15 units, 3 access points   Remaining 20 units were discarded due to unavoidable waste.   All injections were done after  after negative drawback for blood. Patient had some vasovagal symptoms (history of) post procedure, which resolved with laying flat and hydrating for 5 minutes. Post procedure instructions were given. A followup appointment was made.

## 2019-08-11 ENCOUNTER — Encounter: Payer: Medicaid Other | Admitting: Pain Medicine

## 2019-08-12 ENCOUNTER — Ambulatory Visit
Admission: RE | Admit: 2019-08-12 | Discharge: 2019-08-12 | Disposition: A | Discharge status: Home / Self Care | Payer: Medicaid Other | Source: Ambulatory Visit | Attending: Family Medicine | Admitting: Family Medicine

## 2019-08-12 ENCOUNTER — Ambulatory Visit: Payer: Self-pay

## 2019-08-12 ENCOUNTER — Other Ambulatory Visit: Payer: Self-pay

## 2019-08-12 DIAGNOSIS — M542 Cervicalgia: Secondary | ICD-10-CM

## 2019-08-12 DIAGNOSIS — R1011 Right upper quadrant pain: Secondary | ICD-10-CM | POA: Insufficient documentation

## 2019-08-12 DIAGNOSIS — R109 Unspecified abdominal pain: Secondary | ICD-10-CM | POA: Insufficient documentation

## 2019-08-12 DIAGNOSIS — R197 Diarrhea, unspecified: Secondary | ICD-10-CM

## 2019-08-12 DIAGNOSIS — M7702 Medial epicondylitis, left elbow: Secondary | ICD-10-CM

## 2019-08-12 DIAGNOSIS — M6281 Muscle weakness (generalized): Secondary | ICD-10-CM
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
## 2019-08-13 ENCOUNTER — Other Ambulatory Visit: Payer: Self-pay | Admitting: Emergency Medicine

## 2019-08-13 ENCOUNTER — Telehealth: Payer: Self-pay | Admitting: Family Medicine

## 2019-08-13 NOTE — Telephone Encounter (Signed)
Pt needs a refill on vit d sent to [facility] phone number [phone] and fax number [phone]

## 2019-08-13 NOTE — Telephone Encounter (Signed)
Dana Bradley note to send to [facility]

## 2019-08-16 ENCOUNTER — Ambulatory Visit: Payer: Self-pay

## 2019-08-16 ENCOUNTER — Other Ambulatory Visit: Payer: Self-pay

## 2019-08-16 DIAGNOSIS — M6281 Muscle weakness (generalized): Secondary | ICD-10-CM

## 2019-08-16 DIAGNOSIS — M7702 Medial epicondylitis, left elbow: Secondary | ICD-10-CM

## 2019-08-16 DIAGNOSIS — M542 Cervicalgia: Secondary | ICD-10-CM
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
## 2019-08-18 ENCOUNTER — Ambulatory Visit: Payer: Self-pay

## 2019-08-18 ENCOUNTER — Other Ambulatory Visit: Payer: Self-pay

## 2019-08-18 DIAGNOSIS — M7702 Medial epicondylitis, left elbow: Secondary | ICD-10-CM

## 2019-08-18 DIAGNOSIS — M542 Cervicalgia: Secondary | ICD-10-CM

## 2019-08-18 DIAGNOSIS — M6281 Muscle weakness (generalized): Secondary | ICD-10-CM
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
## 2019-08-20 ENCOUNTER — Other Ambulatory Visit: Payer: Self-pay | Admitting: Family Medicine

## 2019-08-20 MED ORDER — VITAMIN D (ERGOCALCIFEROL) 1.25 MG (50000 UNIT) PO CAPS: 50000.0000 [IU] | ORAL_CAPSULE | ORAL | 0 refills | Status: DC

## 2019-08-23 ENCOUNTER — Telehealth: Payer: Medicaid Other | Admitting: Pain Medicine

## 2019-08-25 ENCOUNTER — Ambulatory Visit: Payer: Self-pay

## 2019-08-25 ENCOUNTER — Other Ambulatory Visit: Payer: Self-pay

## 2019-08-25 DIAGNOSIS — M7702 Medial epicondylitis, left elbow: Secondary | ICD-10-CM

## 2019-08-25 DIAGNOSIS — M542 Cervicalgia: Secondary | ICD-10-CM

## 2019-08-25 DIAGNOSIS — M6281 Muscle weakness (generalized): Secondary | ICD-10-CM
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
## 2019-08-31 ENCOUNTER — Ambulatory Visit: Payer: Self-pay | Attending: Orthopaedic Surgery

## 2019-08-31 ENCOUNTER — Other Ambulatory Visit: Payer: Self-pay

## 2019-08-31 DIAGNOSIS — G8929 Other chronic pain: Secondary | ICD-10-CM | POA: Insufficient documentation

## 2019-08-31 DIAGNOSIS — M6281 Muscle weakness (generalized): Secondary | ICD-10-CM | POA: Insufficient documentation

## 2019-08-31 DIAGNOSIS — M7702 Medial epicondylitis, left elbow: Secondary | ICD-10-CM | POA: Insufficient documentation

## 2019-08-31 DIAGNOSIS — M5442 Lumbago with sciatica, left side: Secondary | ICD-10-CM | POA: Insufficient documentation

## 2019-08-31 DIAGNOSIS — M5441 Lumbago with sciatica, right side: Secondary | ICD-10-CM | POA: Insufficient documentation

## 2019-08-31 DIAGNOSIS — M542 Cervicalgia: Secondary | ICD-10-CM | POA: Insufficient documentation
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
## 2019-09-02 ENCOUNTER — Other Ambulatory Visit: Payer: Self-pay

## 2019-09-02 ENCOUNTER — Encounter: Payer: Self-pay | Admitting: Pain Medicine

## 2019-09-02 ENCOUNTER — Ambulatory Visit: Payer: Self-pay

## 2019-09-02 DIAGNOSIS — M7702 Medial epicondylitis, left elbow: Secondary | ICD-10-CM

## 2019-09-02 DIAGNOSIS — M6281 Muscle weakness (generalized): Secondary | ICD-10-CM

## 2019-09-02 DIAGNOSIS — M542 Cervicalgia: Secondary | ICD-10-CM
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
## 2019-09-02 NOTE — Progress Notes (Signed)
Pain relief after procedure (treated area only): (Questions asked to patient) 1. Starting about 15 minutes after the procedure, and "while the area was still numb" (from the local anesthetics), were you having any of your usual pain "in that area" (the treated area)?  (NOTE: NOT including the discomfort from the needle sticks.) First 1 hour:80% better. First 4-6 hours: 30 % better. 2. Assuming that it did get numb. How long was the area numb? 80 % benefit, longer than 6 hours. How long? 4 hours. 3. How much better is your pain now, when compared to before the procedure? Current benefit:20 % better. 4. Can you move better now? Improvement in ROM (Range of Motion): No. 5. Can you do more now? Improvement in function: No. 4. Did you have any problems with the procedure? Side-effects/Complications: No.

## 2019-09-03 ENCOUNTER — Encounter: Payer: Self-pay | Admitting: Physical Medicine and Rehabilitation

## 2019-09-06 ENCOUNTER — Ambulatory Visit: Payer: Self-pay

## 2019-09-06 ENCOUNTER — Ambulatory Visit: Payer: Medicaid Other | Attending: Pain Medicine | Admitting: Pain Medicine

## 2019-09-06 ENCOUNTER — Other Ambulatory Visit: Payer: Self-pay

## 2019-09-06 DIAGNOSIS — G8929 Other chronic pain: Secondary | ICD-10-CM

## 2019-09-06 DIAGNOSIS — M6281 Muscle weakness (generalized): Secondary | ICD-10-CM

## 2019-09-06 DIAGNOSIS — M5481 Occipital neuralgia: Secondary | ICD-10-CM

## 2019-09-06 DIAGNOSIS — M5441 Lumbago with sciatica, right side: Secondary | ICD-10-CM

## 2019-09-06 DIAGNOSIS — M5489 Other dorsalgia: Secondary | ICD-10-CM

## 2019-09-06 DIAGNOSIS — M79605 Pain in left leg: Secondary | ICD-10-CM | POA: Insufficient documentation

## 2019-09-06 DIAGNOSIS — M25552 Pain in left hip: Secondary | ICD-10-CM

## 2019-09-06 DIAGNOSIS — R252 Cramp and spasm: Secondary | ICD-10-CM

## 2019-09-06 DIAGNOSIS — M62838 Other muscle spasm: Secondary | ICD-10-CM

## 2019-09-06 DIAGNOSIS — M79604 Pain in right leg: Secondary | ICD-10-CM

## 2019-09-06 DIAGNOSIS — M5416 Radiculopathy, lumbar region: Secondary | ICD-10-CM

## 2019-09-06 DIAGNOSIS — M533 Sacrococcygeal disorders, not elsewhere classified: Secondary | ICD-10-CM

## 2019-09-06 DIAGNOSIS — M797 Fibromyalgia: Secondary | ICD-10-CM

## 2019-09-06 DIAGNOSIS — G894 Chronic pain syndrome: Secondary | ICD-10-CM

## 2019-09-06 DIAGNOSIS — M7702 Medial epicondylitis, left elbow: Secondary | ICD-10-CM

## 2019-09-06 DIAGNOSIS — M542 Cervicalgia: Secondary | ICD-10-CM

## 2019-09-06 DIAGNOSIS — M545 Low back pain: Secondary | ICD-10-CM

## 2019-09-06 NOTE — Progress Notes (Signed)
Pain Management Virtual Encounter Note - Virtual Visit via Telephone Telehealth (real-time audio visits between healthcare provider and patient).   Patient's Phone No. & Preferred Pharmacy:  [phone] (home); [phone] (mobile); (Preferred) [phone] [email or web address]  [place], [place] [address] Phone: [phone] Fax: [phone]  [facility] [place], [address] [place] [address] Phone: [phone] Fax: [phone]    Pre-screening note:  Our staff contacted Ms. Gilani and offered her an "in person", "face-to-face" appointment versus a telephone encounter. She indicated preferring the telephone encounter, at this time.   Reason for Virtual Visit: COVID-19*  Social distancing based on CDC and AMA recommendations.   I contacted [name] on 09/06/2019 via telephone.      I clearly identified myself as Gaspar Cola, MD. I verified that I was speaking with the correct person using two identifiers (Name: Mishelle Hroncich, and date of birth: 1971-05-05).  Advanced Informed Consent I sought verbal advanced consent from [name] for virtual visit interactions. I informed Ms. Stutzman of possible security and privacy concerns, risks, and limitations associated with providing "not-in-person" medical evaluation and management services. I also informed Ms. Fout of the availability of "in-person" appointments. Finally, I informed her that there would be a charge for the virtual visit and that she could be  personally, fully or partially, financially responsible for it. Ms. Gabrielle expressed understanding and agreed to proceed.   Historic Elements   Ms. Sorayah Geer is a 48 y.o. year old, female patient evaluated today after her last encounter by our practice on 08/04/2019. Ms. Kieper  has a past medical history of Acute postoperative pain  (04/07/2017), Anxiety, Bursitis, Chronic fatigue (12/12/2017), Chronic fatigue syndrome, Edema leg (05/02/2015), Fibromyalgia, GERD (gastroesophageal reflux disease), IBS (irritable bowel syndrome), Knee pain, bilateral (12/21/2008), Lumbar discitis, Migraines, Osteoarthritis, Right hand pain (04/10/2015), Sleep apnea, Spinal stenosis, SVT (supraventricular tachycardia) ([place]), Vertigo, and Vitamin D deficiency (05/01/2016). She also  has a past surgical history that includes Ablation; Tubal ligation (10/01/99); Cardiac catheterization; Knee arthroscopy; Colonoscopy with propofol (N/A, 05/17/2015); Esophagogastroduodenoscopy (N/A, 05/17/2015); spg; and Tibial Tubercle Bypass (Right, 1998). Ms. Dejoie has a current medication list which includes the following prescription(s): acetaminophen, atorvastatin, baclofen, vitamin b-12, dexilant, diclofenac sodium, dicyclomine, hydrocodone-acetaminophen, magnesium oxide, melatonin, metoprolol tartrate, ondansetron, pregabalin, valerian, and vitamin d (ergocalciferol). She  reports that she quit smoking about 26 years ago. Her smoking use included cigarettes. She has a 12.00 pack-year smoking history. She has never used smokeless tobacco. She reports that she does not drink alcohol or use drugs. Ms. Kinkade is allergic to aspirin; cymbalta [duloxetine hcl]; depakote [divalproex sodium]; gadolinium derivatives; haloperidol; meperidine; reglan [metoclopramide]; tramadol hcl; trazodone; compazine [prochlorperazine]; meloxicam; penicillins; tomato; other; shellfish allergy; shellfish-derived products; bacitracin-neomycin-polymyxin; cephalosporins; ibuprofen; latex; neosporin [neomycin-bacitracin zn-polymyx]; nsaids; sulfa antibiotics; and sulfonamide derivatives.   HPI  Today, she is being contacted for a post-procedure assessment.  The patient indicates having attained some benefit from this, but she still having pain that is going all the way down into the lateral aspect of her left foot  and into her smaller toe suggesting an S1 dermatomal distribution.  From the information provided by the patient today, it would seem that she is having problems arising from both the L1-2 and the L4-5 levels.  We have given her an epidural steroid injection at the L1-2 level and this seems to have helped to some degree, but  she still having the problems in the lower portion of the lumbar spine.  She also describes having weakness in the right lower extremity that seems to be in the proximal portion of the leg, which would go with the L1-2 problem.  Today we will order a referral to physical therapy to work with the lower extremity weakness and we will also get a referral with neurosurgery to see if there is anything that they can offer her based on the problems that she is presenting.  She had a recent MRI of the cervical, thoracic, and lumbar spine that demonstrated a small C5-6 right central disc extrusion that has progressed from her November 30, 2014 MRI.  In the thoracic spine MRI she does have a right paracentral disc protrusion at the T7-8 level that seems to be causing some flattening of the ventral spinal cord with resultant mild central spinal stenosis.  Finally, her lumbar MRI reveals a right paracentral disc protrusion at the L1-2 level with minimal broad based disc bulge at the L4-5 level.  It would seem that this last 1 may be irritating the S1 nerve on the left side and therefore I will go ahead and schedule her to come back for a left-sided L4-5 LESI #1 under fluoroscopic guidance, to see if this can help some of her pain and left S1 radiculitis.  Post-Procedure Evaluation  Procedure: Diagnostic left-sided sacroiliac joint block #1 + diagnostic left-sided intra-articular hip joint injection #1 under fluoroscopic guidance and IV sedation Pre-procedure pain level:  4/10 Post-procedure: 2/10 50% relief  Sedation: Sedation provided.  Landis Martins, RN  09/02/2019 10:04 AM  Sign when Signing  Visit Pain relief after procedure (treated area only): (Questions asked to patient) 1. Starting about 15 minutes after the procedure, and "while the area was still numb" (from the local anesthetics), were you having any of your usual pain "in that area" (the treated area)?  (NOTE: NOT including the discomfort from the needle sticks.) First 1 hour:80% better. First 4-6 hours: 30 % better. 2. Assuming that it did get numb. How long was the area numb? 80 % benefit, longer than 6 hours. How long? 4 hours. 3. How much better is your pain now, when compared to before the procedure? Current benefit:20 % better. 4. Can you move better now? Improvement in ROM (Range of Motion): No. 5. Can you do more now? Improvement in function: No. 4. Did you have any problems with the procedure? Side-effects/Complications: No.   Current benefits: Defined as benefit that persist at this time.   Analgesia:  <50% better Function: No improvement ROM: No improvement  Pharmacotherapy Assessment  Analgesic: Hydrocodone/APAP 7.5/325, 1 tab PO QD (7.5 mg/day of hydrocodone)  MME/day:7.5mg /day.   Monitoring: Pharmacotherapy: No side-effects or adverse reactions reported. [place] PMP: PDMP reviewed during this encounter.       Compliance: No problems identified. Effectiveness: Clinically acceptable. Plan: Refer to "POC".  UDS:  Summary  Date Value Ref Range Status  03/05/2018 FINAL  Final    Comment:    ==================================================================== TOXASSURE SELECT 13 (MW) ==================================================================== Test                             Result       Flag       Units Drug Present   Hydrocodone                    422  ng/mg creat   Hydromorphone                  105                     ng/mg creat   Norhydrocodone                 312                     ng/mg creat    Sources of hydrocodone include scheduled prescription     medications. Hydromorphone and norhydrocodone are expected    metabolites of hydrocodone. Hydromorphone is also available as a    scheduled prescription medication. ==================================================================== Test                      Result    Flag   Units      Ref Range   Creatinine              148              mg/dL      >=20 ==================================================================== Declared Medications:  Medication list was not provided. ==================================================================== For clinical consultation, please call [phone]. ====================================================================    Laboratory Chemistry Profile (12 mo)  Renal: 08/01/2019: BUN 10; Creatinine, Ser 0.68  Lab Results  Component Value Date   GFRAA >60 08/01/2019   GFRNONAA >60 08/01/2019   Hepatic: 08/01/2019: Albumin 3.8 Lab Results  Component Value Date   AST 27 08/01/2019   ALT 42 08/01/2019   Other: 08/01/2019: Vit D, 25-Hydroxy 12.48 Note: Above Lab results reviewed.  Imaging  US Abdomen Limited RUQ CLINICAL DATA:  Intermittent right upper quadrant pain, cramping, and diarrhea for several months.  EXAM: ULTRASOUND ABDOMEN LIMITED RIGHT UPPER QUADRANT  COMPARISON:  None.  FINDINGS: Gallbladder:  No gallstones or wall thickening visualized. No sonographic Murphy sign noted by sonographer.  Common bile duct:  Diameter: 4 mm, within normal limits.  Liver:  Diffusely increased echogenicity of the hepatic parenchyma, consistent with hepatic steatosis. No hepatic mass identified. Portal vein is patent on color Doppler imaging with normal direction of blood flow towards the liver.  Other: Probable shadowing calculus seen in lower pole of right kidney. No hydronephrosis demonstrated.  IMPRESSION: Diffuse hepatic steatosis. No other hepatobiliary abnormality identified.  Probable right nephrolithiasis  incidentally noted. No evidence of right hydronephrosis.  Electronically Signed   By: Marlaine Hind M.D.   On: 08/12/2019 14:39   Assessment  The primary encounter diagnosis was Chronic pain syndrome. Diagnoses of Chronic low back pain (Primary Area of Pain) (Bilateral) (R>L) (midline), Chronic upper back pain (Secondary area of Pain) (Bilateral) (L>R), Chronic occipital neuralgia (Third area of Pain) (Bilateral) (L>R), Chronic hip pain (Left), Chronic sacroiliac joint pain (Left), Fibromyalgia, Muscle spasticity, Lumbar radiculitis (Left), Chronic lower extremity cramps (Bilateral) (R>L), and Chronic lower extremity pain (Bilateral) were also pertinent to this visit.  Plan of Care  Problem-specific:  No problem-specific Assessment & Plan notes found for this encounter.  I have discontinued [employer] and triamcinolone cream. I am also having her maintain her Vitamin B-12, magnesium oxide, ondansetron, atorvastatin, metoprolol tartrate, acetaminophen, Melatonin, VALERIAN PO, pregabalin, HYDROcodone-acetaminophen, dicyclomine, Dexilant, baclofen, diclofenac sodium, and Vitamin D (Ergocalciferol).  Pharmacotherapy (Medications Ordered): No orders of the defined types were placed in this encounter.  Orders:  Orders Placed This Encounter  Procedures  . LESI (Schedule)    Standing Status:  Future    Standing Expiration Date:   10/07/2019    Scheduling Instructions:     Procedure: Interlaminar Lumbar Epidural Steroid injection (LESI)  L4-5     Laterality: Left-sided     Sedation: Patient's choice.     Timeframe: ASAA    Order Specific Question:   Where will this procedure be performed?    Answer:   [facility]  . AMB PT Referral (2-3x/wk, x 6wks)    Referral Priority:   Routine    Referral Type:   Physical Medicine    Referral Reason:   Specialty Services Required    Requested Specialty:   Physical Therapy    Number of Visits Requested:   1  . Neurosurgery     Referral Priority:   Routine    Referral Type:   Surgical    Referral Reason:   Specialty Services Required    Requested Specialty:   Neurosurgery    Number of Visits Requested:   1   Follow-up plan:   Return for Procedure (w/ sedation): (L) L4-5 LESI #1.      Interventional treatment options:  Under consideration: Possible bilateral lumbar facet RFA. Diagnostic thoracic facet block Possible bilateral thoracic facet RFA. Diagnostic bilateral cervical facet blocks Possible bilateral cervical facet RFA. Diagnostic bilateral lesser occipital NB Possible right occipital nerve RFA. Diagnostic right [id number] Diagnostic bilateral suprascapular NB Diagnostic bilateral IA knee joint injection Possible bilateral IA Hyalgan knee injections.  Diagnostic bilateral Genicular NB Possible bilateral Genicular nerve RFA.   Therapeutic/palliative (PRN): Diagnostic left SI joint block #2  Diagnostic/therapeutic left IA hip joint injection #2  Diagnostic bilateral lumbar facet block #2  Palliative left trapezius muscle MNB #4 Palliative right trapezius muscle MNB #2 Palliative left L1-2 LESI #2 Palliative right L1-2 LESI #2 Palliativeleft CESI #3 Palliative left greater occipital NB #3  Palliative left C2 + TON NB #2  Palliative left GON + C2 + TON RFA #2 (last done 12/25/2017)    Recent Visits Date Type Provider Dept  08/03/19 Procedure visit Milinda Pointer, MD [facility]  07/21/19 Office Visit Milinda Pointer, MD [facility]  Showing recent visits within past 90 days and meeting all other requirements   Today's Visits Date Type Provider Dept  09/06/19 Telemedicine Milinda Pointer, MD [facility]  Showing today's visits and meeting all other requirements   Future Appointments Date Type Provider Dept  11/08/19 Appointment Milinda Pointer, MD [facility]  Showing future appointments within next 90 days and meeting  all other requirements   I discussed the assessment and treatment plan with the patient. The patient was provided an opportunity to ask questions and all were answered. The patient agreed with the plan and demonstrated an understanding of the instructions.  Patient advised to call back or seek an in-person evaluation if the symptoms or condition worsens.  Total duration of non-face-to-face encounter: 15 minutes.  Note by: Gaspar Cola, MD Date: 09/06/2019; Time: 12:19 PM  Note: This dictation was prepared with Dragon dictation. Any transcriptional errors that may result from this process are unintentional.  Disclaimer:  * Given the special circumstances of the COVID-19 pandemic, the federal government has announced that the Office for Civil Rights (OCR) will exercise its enforcement discretion and will not impose penalties on physicians using telehealth in the event of noncompliance with regulatory requirements under the [place] (HIPAA) in connection with the good faith provision of telehealth during the COVID-19  Retail banker. ([place])
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
## 2019-09-06 NOTE — Patient Instructions (Signed)

## 2019-09-07 ENCOUNTER — Ambulatory Visit (HOSPITAL_BASED_OUTPATIENT_CLINIC_OR_DEPARTMENT_OTHER): Payer: Self-pay | Admitting: Pain Medicine

## 2019-09-07 ENCOUNTER — Encounter: Payer: Self-pay | Admitting: Pain Medicine

## 2019-09-07 ENCOUNTER — Ambulatory Visit
Admission: RE | Admit: 2019-09-07 | Discharge: 2019-09-07 | Disposition: A | Discharge status: Home / Self Care | Payer: Medicaid Other | Source: Ambulatory Visit | Attending: Pain Medicine | Admitting: Pain Medicine

## 2019-09-07 VITALS — BP 125/59 | HR 75 | Temp 98.1°F | Resp 12 | Ht 63.0 in | Wt 212.0 lb

## 2019-09-07 DIAGNOSIS — M797 Fibromyalgia: Secondary | ICD-10-CM | POA: Insufficient documentation

## 2019-09-07 DIAGNOSIS — R29898 Other symptoms and signs involving the musculoskeletal system: Secondary | ICD-10-CM | POA: Insufficient documentation

## 2019-09-07 DIAGNOSIS — G894 Chronic pain syndrome: Secondary | ICD-10-CM | POA: Insufficient documentation

## 2019-09-07 DIAGNOSIS — M79604 Pain in right leg: Secondary | ICD-10-CM | POA: Insufficient documentation

## 2019-09-07 DIAGNOSIS — M79605 Pain in left leg: Secondary | ICD-10-CM

## 2019-09-07 DIAGNOSIS — G8929 Other chronic pain: Secondary | ICD-10-CM

## 2019-09-07 DIAGNOSIS — M5416 Radiculopathy, lumbar region: Secondary | ICD-10-CM

## 2019-09-07 DIAGNOSIS — Z91040 Latex allergy status: Secondary | ICD-10-CM | POA: Insufficient documentation

## 2019-09-07 DIAGNOSIS — M5136 Other intervertebral disc degeneration, lumbar region: Secondary | ICD-10-CM

## 2019-09-07 DIAGNOSIS — M51369 Other intervertebral disc degeneration, lumbar region without mention of lumbar back pain or lower extremity pain: Secondary | ICD-10-CM

## 2019-09-07 DIAGNOSIS — R55 Syncope and collapse: Secondary | ICD-10-CM

## 2019-09-07 DIAGNOSIS — Z91041 Radiographic dye allergy status: Secondary | ICD-10-CM

## 2019-09-07 MED ORDER — HYDROCODONE-ACETAMINOPHEN 7.5-325 MG PO TABS: 1.0000 | ORAL_TABLET | Freq: Four times a day (QID) | ORAL | 0 refills | Status: DC | PRN

## 2019-09-07 MED ORDER — LACTATED RINGERS IV SOLN
1000.0000 mL | Freq: Once | INTRAVENOUS | Status: AC
  Administered 2019-09-07: 10:00:00 1000 mL via INTRAVENOUS

## 2019-09-07 MED ORDER — TRIAMCINOLONE ACETONIDE 40 MG/ML IJ SUSP
40.0000 mg | Freq: Once | INTRAMUSCULAR | Status: AC
  Administered 2019-09-07: 40 mg

## 2019-09-07 MED ORDER — MIDAZOLAM HCL 5 MG/5ML IJ SOLN
1.0000 mg | INTRAMUSCULAR | Status: DC | PRN
  Administered 2019-09-07: 1 mg via INTRAVENOUS
  Filled 2019-09-07: qty 5

## 2019-09-07 MED ORDER — PREGABALIN 150 MG PO CAPS: 150.0000 mg | ORAL_CAPSULE | Freq: Three times a day (TID) | ORAL | 1 refills | Status: DC

## 2019-09-07 MED ORDER — SODIUM CHLORIDE (PF) 0.9 % IJ SOLN
INTRAMUSCULAR | Status: AC
  Filled 2019-09-07: qty 10

## 2019-09-07 MED ORDER — LIDOCAINE HCL 2 % IJ SOLN
20.0000 mL | Freq: Once | INTRAMUSCULAR | Status: AC
  Administered 2019-09-07: 400 mg
  Filled 2019-09-07: qty 40

## 2019-09-07 MED ORDER — SODIUM CHLORIDE 0.9% FLUSH
2.0000 mL | Freq: Once | INTRAVENOUS | Status: AC
  Administered 2019-09-07: 2 mL

## 2019-09-07 MED ORDER — GLYCOPYRROLATE 0.2 MG/ML IJ SOLN
0.2000 mg | Freq: Once | INTRAMUSCULAR | Status: AC
  Administered 2019-09-07: 0.2 mg via INTRAVENOUS
  Filled 2019-09-07: qty 1

## 2019-09-07 MED ORDER — TRIAMCINOLONE ACETONIDE 40 MG/ML IJ SUSP
INTRAMUSCULAR | Status: AC
  Filled 2019-09-07: qty 1

## 2019-09-07 MED ORDER — FENTANYL CITRATE (PF) 100 MCG/2ML IJ SOLN
25.0000 ug | INTRAMUSCULAR | Status: DC | PRN
  Administered 2019-09-07: 50 ug via INTRAVENOUS
  Filled 2019-09-07: qty 2

## 2019-09-07 MED ORDER — ROPIVACAINE HCL 2 MG/ML IJ SOLN
INTRAMUSCULAR | Status: AC
  Filled 2019-09-07: qty 10

## 2019-09-07 MED ORDER — ROPIVACAINE HCL 2 MG/ML IJ SOLN
2.0000 mL | Freq: Once | INTRAMUSCULAR | Status: AC
  Administered 2019-09-07: 2 mL via EPIDURAL

## 2019-09-07 MED ORDER — DIPHENHYDRAMINE HCL 50 MG/ML IJ SOLN
12.5000 mg | Freq: Once | INTRAMUSCULAR | Status: AC
  Administered 2019-09-07: 12.5 mg via INTRAVENOUS
  Filled 2019-09-07: qty 1

## 2019-09-07 NOTE — Progress Notes (Signed)
Safety precautions to be maintained throughout the outpatient stay will include: orient to surroundings, keep bed in low position, maintain call bell within reach at all times, provide assistance with transfer out of bed and ambulation.  

## 2019-09-07 NOTE — Progress Notes (Signed)
Patient's Name: Dana Bradley  MRN: [id number]  Referring Provider: Hubbard Hartshorn, FNP  DOB: 10/23/1970  PCP: Hubbard Hartshorn, FNP  DOS: 09/07/2019  Note by: Gaspar Cola, MD  Service setting: Ambulatory outpatient  Specialty: Interventional Pain Management  Patient type: Established  Location: [facility] (AMB) Pain Management Facility  Visit type: Interventional Procedure   Primary Reason for Visit: Interventional Pain Management Treatment. CC: Back Pain (lower)  Procedure:          Anesthesia, Analgesia, Anxiolysis:  Type: Therapeutic Inter-Laminar Epidural Steroid Injection  #1  Region: Lumbar Level: L4-5 Level. Laterality: Left-Sided Paramedial  Type: Moderate (Conscious) Sedation combined with Local Anesthesia Indication(s): Analgesia and Anxiety Route: Intravenous (IV) IV Access: Secured Sedation: Meaningful verbal contact was maintained at all times during the procedure  Local Anesthetic: Lidocaine 1-2%  Position: Prone with head of the table was raised to facilitate breathing.   Indications: 1. DDD (degenerative disc disease), lumbar   2. Chronic lower extremity pain (Bilateral)   3. Lumbar radiculitis (Left)   4. Weakness of leg (Left)   5. History of allergy to radiographic contrast media   6. Latex precautions, history of latex allergy   7. History of vasovagal episode    Pain Score: Pre-procedure: 4 /10 Post-procedure: 4 /10   Pre-op Assessment:  Dana Bradley is a 48 y.o. (year old), female patient, seen today for interventional treatment. She  has a past surgical history that includes Ablation; Tubal ligation (10/01/99); Cardiac catheterization; Knee arthroscopy; Colonoscopy with propofol (N/A, 05/17/2015); Esophagogastroduodenoscopy (N/A, 05/17/2015); spg; and Tibial Tubercle Bypass (Right, 1998). Dana Bradley has a current medication list which includes the following prescription(s): acetaminophen, atorvastatin, baclofen, vitamin b-12, dexilant, diclofenac sodium, dicyclomine,  hydrocodone-acetaminophen, hydrocodone-acetaminophen, magnesium oxide, melatonin, metoprolol tartrate, ondansetron, pregabalin, pregabalin, valerian, and vitamin d (ergocalciferol), and the following Facility-Administered Medications: fentanyl and midazolam. Her primarily concern today is the Back Pain (lower)  Initial Vital Signs:  Pulse/HCG Rate: 75ECG Heart Rate: 79 Temp: 97.7 F (36.5 C) Resp: 16 BP: 120/76 SpO2: 99 %  BMI: Estimated body mass index is 37.55 kg/m as calculated from the following:   Height as of this encounter: 5\' 3"  (1.6 m).   Weight as of this encounter: 212 lb (96.2 kg).  Risk Assessment: Allergies: Reviewed. She is allergic to aspirin; cymbalta [duloxetine hcl]; depakote [divalproex sodium]; gadolinium derivatives; haloperidol; meperidine; reglan [metoclopramide]; tramadol hcl; trazodone; compazine [prochlorperazine]; meloxicam; penicillins; tomato; other; shellfish allergy; shellfish-derived products; bacitracin-neomycin-polymyxin; cephalosporins; ibuprofen; latex; neosporin [neomycin-bacitracin zn-polymyx]; nsaids; sulfa antibiotics; and sulfonamide derivatives.  Allergy Precautions: None required Coagulopathies: Reviewed. None identified.  Blood-thinner therapy: None at this time Active Infection(s): Reviewed. None identified. Dana Bradley is afebrile  Site Confirmation: Dana Bradley was asked to confirm the procedure and laterality before marking the site Procedure checklist: Completed Consent: Before the procedure and under the influence of no sedative(s), amnesic(s), or anxiolytics, the patient was informed of the treatment options, risks and possible complications. To fulfill our ethical and legal obligations, as recommended by the American Medical Association's Code of Ethics, I have informed the patient of my clinical impression; the nature and purpose of the treatment or procedure; the risks, benefits, and possible complications of the intervention; the  alternatives, including doing nothing; the risk(s) and benefit(s) of the alternative treatment(s) or procedure(s); and the risk(s) and benefit(s) of doing nothing. The patient was provided information about the general risks and possible complications associated with the procedure. These may include, but are not limited to: failure to achieve  desired goals, infection, bleeding, organ or nerve damage, allergic reactions, paralysis, and death. In addition, the patient was informed of those risks and complications associated to Spine-related procedures, such as failure to decrease pain; infection (i.e.: Meningitis, epidural or intraspinal abscess); bleeding (i.e.: epidural hematoma, subarachnoid hemorrhage, or any other type of intraspinal or peri-dural bleeding); organ or nerve damage (i.e.: Any type of peripheral nerve, nerve root, or spinal cord injury) with subsequent damage to sensory, motor, and/or autonomic systems, resulting in permanent pain, numbness, and/or weakness of one or several areas of the body; allergic reactions; (i.e.: anaphylactic reaction); and/or death. Furthermore, the patient was informed of those risks and complications associated with the medications. These include, but are not limited to: allergic reactions (i.e.: anaphylactic or anaphylactoid reaction(s)); adrenal axis suppression; blood sugar elevation that in diabetics may result in ketoacidosis or comma; water retention that in patients with history of congestive heart failure may result in shortness of breath, pulmonary edema, and decompensation with resultant heart failure; weight gain; swelling or edema; medication-induced neural toxicity; particulate matter embolism and blood vessel occlusion with resultant organ, and/or nervous system infarction; and/or aseptic necrosis of one or more joints. Finally, the patient was informed that Medicine is not an exact science; therefore, there is also the possibility of unforeseen or  unpredictable risks and/or possible complications that may result in a catastrophic outcome. The patient indicated having understood very clearly. We have given the patient no guarantees and we have made no promises. Enough time was given to the patient to ask questions, all of which were answered to the patient's satisfaction. Dana Bradley has indicated that she wanted to continue with the procedure. Attestation: I, the ordering provider, attest that I have discussed with the patient the benefits, risks, side-effects, alternatives, likelihood of achieving goals, and potential problems during recovery for the procedure that I have provided informed consent. Date  Time: 09/07/2019  8:40 AM  Pre-Procedure Preparation:  Monitoring: As per clinic protocol. Respiration, ETCO2, SpO2, BP, heart rate and rhythm monitor placed and checked for adequate function Safety Precautions: Patient was assessed for positional comfort and pressure points before starting the procedure. Time-out: I initiated and conducted the "Time-out" before starting the procedure, as per protocol. The patient was asked to participate by confirming the accuracy of the "Time Out" information. Verification of the correct person, site, and procedure were performed and confirmed by me, the nursing staff, and the patient. "Time-out" conducted as per Joint Commission's Universal Protocol (UP.01.01.01). Time: 0939  Description of Procedure:          Target Area: The interlaminar space, initially targeting the lower laminar border of the superior vertebral body. Approach: Paramedial approach. Area Prepped: Entire Posterior Lumbar Region Prepping solution: DuraPrep (Iodine Povacrylex [0.7% available iodine] and Isopropyl Alcohol, 74% w/w) Safety Precautions: Aspiration looking for blood return was conducted prior to all injections. At no point did we inject any substances, as a needle was being advanced. No attempts were made at seeking any  paresthesias. Safe injection practices and needle disposal techniques used. Medications properly checked for expiration dates. SDV (single dose vial) medications used. Description of the Procedure: Protocol guidelines were followed. The procedure needle was introduced through the skin, ipsilateral to the reported pain, and advanced to the target area. Bone was contacted and the needle walked caudad, until the lamina was cleared. The epidural space was identified using "loss-of-resistance technique" with 2-3 ml of PF-NaCl (0.9% NSS), in a 5cc LOR glass syringe.  Vitals:  09/07/19 0946 09/07/19 0955 09/07/19 1005 09/07/19 1015  BP: 127/78 128/61 (!) 125/58 (!) 125/59  Pulse:      Resp: 17 12 10 12   Temp:  98.4 F (36.9 C)  98.1 F (36.7 C)  TempSrc:      SpO2: 100% 98% 98% 97%  Weight:      Height:        Start Time: 0939 hrs. End Time: 0946 hrs.  Materials:  Needle(s) Type: Epidural needle Gauge: 17G Length: 3.5-in Medication(s): Please see orders for medications and dosing details.  Imaging Guidance (Spinal):          Type of Imaging Technique: Fluoroscopy Guidance (Spinal) Indication(s): Assistance in needle guidance and placement for procedures requiring needle placement in or near specific anatomical locations not easily accessible without such assistance. Exposure Time: Please see nurses notes. Contrast: Before injecting any contrast, we confirmed that the patient did not have an allergy to iodine, shellfish, or radiological contrast. Once satisfactory needle placement was completed at the desired level, radiological contrast was injected. Contrast injected under live fluoroscopy. No contrast complications. See chart for type and volume of contrast used. Fluoroscopic Guidance: I was personally present during the use of fluoroscopy. "Tunnel Vision Technique" used to obtain the best possible view of the target area. Parallax error corrected before commencing the procedure.  "Direction-depth-direction" technique used to introduce the needle under continuous pulsed fluoroscopy. Once target was reached, antero-posterior, oblique, and lateral fluoroscopic projection used confirm needle placement in all planes. Images permanently stored in EMR. Interpretation: I personally interpreted the imaging intraoperatively. Adequate needle placement confirmed in multiple planes. Appropriate spread of contrast into desired area was observed. No evidence of afferent or efferent intravascular uptake. No intrathecal or subarachnoid spread observed. Permanent images saved into the patient's record.  Antibiotic Prophylaxis:   Anti-infectives (From admission, onward)   None     Indication(s): None identified  Post-operative Assessment:  Post-procedure Vital Signs:  Pulse/HCG Rate: 7578 Temp: 98.1 F (36.7 C) Resp: 12 BP: (!) 125/59 SpO2: 97 %  EBL: None  Complications: No immediate post-treatment complications observed by team, or reported by patient.  Note: The patient tolerated the entire procedure well. A repeat set of vitals were taken after the procedure and the patient was kept under observation following institutional policy, for this type of procedure. Post-procedural neurological assessment was performed, showing return to baseline, prior to discharge. The patient was provided with post-procedure discharge instructions, including a section on how to identify potential problems. Should any problems arise concerning this procedure, the patient was given instructions to immediately contact us, at any time, without hesitation. In any case, we plan to contact the patient by telephone for a follow-up status report regarding this interventional procedure.  Comments:  No additional relevant information.  Plan of Care  Orders:  Orders Placed This Encounter  Procedures  . LESI (Today)    Scheduling Instructions:     Procedure: Interlaminar LESI L4-5     Laterality:  Left-sided     Sedation: Patient's choice     Timeframe:  Today    Order Specific Question:   Where will this procedure be performed?    Answer:   [facility]  . Fluoro (C-Arm) (<60 min) (No Report)    Intraoperative interpretation by procedural physician at [place].    Standing Status:   Standing    Number of Occurrences:   1    Order Specific Question:   Reason for exam:  Answer:   Assistance in needle guidance and placement for procedures requiring needle placement in or near specific anatomical locations not easily accessible without such assistance.  . Consent: LESI    Provider Attestation: I, Codington Dossie Arbour, MD, (Pain Management Specialist), the physician/practitioner, attest that I have discussed with the patient the benefits, risks, side effects, alternatives, likelihood of achieving goals and potential problems during recovery for the procedure that I have provided informed consent.    Scheduling Instructions:     Procedure: Lumbar epidural steroid injection under fluoroscopic guidance     Indications: Low back and/or lower extremity pain secondary to lumbar radiculitis     Note: Always confirm laterality of pain with Dana Bradley, before procedure.     Transcribe to consent form and obtain patient signature.  Marland Kitchen Epidural Tray    Equipment required: Single use, disposable, "Epidural Tray" Epidural Catheter: NOT required    Standing Status:   Standing    Number of Occurrences:   1    Order Specific Question:   Specify    Answer:   Epidural Tray  . Allergy: CONTRAST    Standing Status:   Standing    Number of Occurrences:   1  . Allergy: LATEX    Activate Latex-Free Protocol.    Standing Status:   Standing    Number of Occurrences:   1   Chronic Opioid Analgesic:  Hydrocodone/APAP 7.5/325, 1 tab PO QD (7.5 mg/day of hydrocodone)  MME/day:7.5mg /day.   Medications ordered for procedure: Meds ordered this encounter  Medications  . lidocaine  (XYLOCAINE) 2 % (with pres) injection 400 mg  . lactated ringers infusion 1,000 mL  . midazolam (VERSED) 5 MG/5ML injection 1-2 mg    Make sure Flumazenil is available in the pyxis when using this medication. If oversedation occurs, administer 0.2 mg IV over 15 sec. If after 45 sec no response, administer 0.2 mg again over 1 min; may repeat at 1 min intervals; not to exceed 4 doses (1 mg)  . fentaNYL (SUBLIMAZE) injection 25-50 mcg    Make sure Narcan is available in the pyxis when using this medication. In the event of respiratory depression (RR< 8/min): Titrate NARCAN (naloxone) in increments of 0.1 to 0.2 mg IV at 2-3 minute intervals, until desired degree of reversal.  . glycopyrrolate (ROBINUL) injection 0.2 mg  . diphenhydrAMINE (BENADRYL) injection 12.5 mg  . ropivacaine (PF) 2 mg/mL (0.2%) (NAROPIN) injection 2 mL  . sodium chloride flush (NS) 0.9 % injection 2 mL  . triamcinolone acetonide (KENALOG-40) injection 40 mg  . HYDROcodone-acetaminophen (NORCO) 7.5-325 MG tablet    Sig: Take 1 tablet by mouth every 6 (six) hours as needed for severe pain. Must last 90 days    Dispense:  30 tablet    Refill:  0    Chronic Pain: STOP Act (Not applicable) Fill 1 day early if closed on refill date. Do not fill until: 10/07/2019. To last until: 11/06/2019. Avoid benzodiazepines within 8 hours of opioids  . pregabalin (LYRICA) 150 MG capsule    Sig: Take 1 capsule (150 mg total) by mouth every 8 (eight) hours.    Dispense:  270 capsule    Refill:  1    Fill one day early if pharmacy is closed on scheduled refill date. May substitute for generic if available.   Medications administered: We administered lidocaine, lactated ringers, midazolam, fentaNYL, glycopyrrolate, diphenhydrAMINE, ropivacaine (PF) 2 mg/mL (0.2%), sodium chloride flush, and triamcinolone acetonide.  See the medical  record for exact dosing, route, and time of administration.  Follow-up plan:   Return in about 2 weeks (around  09/21/2019) for (VV), (PP).       Interventional treatment options:  Under consideration: Possible bilateral lumbar facet RFA. Diagnostic thoracic facet block Possible bilateral thoracic facet RFA. Diagnostic bilateral cervical facet blocks Possible bilateral cervical facet RFA. Diagnostic bilateral lesser occipital NB Possible right occipital nerve RFA. Diagnostic right [id number] Diagnostic bilateral suprascapular NB Diagnostic bilateral IA knee joint injection Possible bilateral IA Hyalgan knee injections.  Diagnostic bilateral Genicular NB Possible bilateral Genicular nerve RFA.   Therapeutic/palliative (PRN): Diagnostic left SI joint block #2  Diagnostic/therapeutic left IA hip joint injection #2  Diagnostic bilateral lumbar facet block #2  Palliative left trapezius muscle MNB #4 Palliative right trapezius muscle MNB #2 Palliative left L1-2 LESI #2 Palliative right L1-2 LESI #2 Palliativeleft CESI #3 Palliative left greater occipital NB #3  Palliative left C2 + TON NB #2  Palliative left GON + C2 + TON RFA #2 (last done 12/25/2017)     Recent Visits Date Type Provider Dept  09/06/19 Telemedicine Milinda Pointer, MD [facility]  08/03/19 Procedure visit Milinda Pointer, MD [facility]  07/21/19 Office Visit Milinda Pointer, MD [facility]  Showing recent visits within past 90 days and meeting all other requirements   Today's Visits Date Type Provider Dept  09/07/19 Procedure visit Milinda Pointer, MD [facility]  Showing today's visits and meeting all other requirements   Future Appointments Date Type Provider Dept  09/21/19 Appointment Milinda Pointer, [facility]  11/08/19 Appointment Milinda Pointer, MD [facility]  Showing future appointments within next 90 days and meeting all other requirements   Disposition: Discharge home  Discharge Date & Time: 09/07/2019;  1021 hrs.   Primary Care Physician: Hubbard Hartshorn, FNP Location: [facility] Note by: Gaspar Cola, MD Date: 09/07/2019; Time: 10:52 AM  Disclaimer:  Medicine is not an Chief Strategy Officer. The only guarantee in medicine is that nothing is guaranteed. It is important to note that the decision to proceed with this intervention was based on the information collected from the patient. The Data and conclusions were drawn from the patient's questionnaire, the interview, and the physical examination. Because the information was provided in large part by the patient, it cannot be guaranteed that it has not been purposely or unconsciously manipulated. Every effort has been made to obtain as much relevant data as possible for this evaluation. It is important to note that the conclusions that lead to this procedure are derived in large part from the available data. Always take into account that the treatment will also be dependent on availability of resources and existing treatment guidelines, considered by other Pain Management Practitioners as being common knowledge and practice, at the time of the intervention. For Medico-Legal purposes, it is also important to point out that variation in procedural techniques and pharmacological choices are the acceptable norm. The indications, contraindications, technique, and results of the above procedure should only be interpreted and judged by a Board-Certified Interventional Pain Specialist with extensive familiarity and expertise in the same exact procedure and technique.

## 2019-09-07 NOTE — Patient Instructions (Signed)

## 2019-09-08 ENCOUNTER — Telehealth: Payer: Self-pay

## 2019-09-08 ENCOUNTER — Ambulatory Visit: Payer: Self-pay

## 2019-09-08 ENCOUNTER — Other Ambulatory Visit: Payer: Self-pay

## 2019-09-08 DIAGNOSIS — M542 Cervicalgia: Secondary | ICD-10-CM

## 2019-09-08 DIAGNOSIS — M7702 Medial epicondylitis, left elbow: Secondary | ICD-10-CM

## 2019-09-08 DIAGNOSIS — M6281 Muscle weakness (generalized): Secondary | ICD-10-CM

## 2019-09-08 NOTE — Telephone Encounter (Signed)
Attempted to call patient, no answer. Left message on AM to call if needed.
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
## 2019-09-09 ENCOUNTER — Telehealth: Payer: Self-pay | Admitting: Pain Medicine

## 2019-09-09 ENCOUNTER — Encounter: Payer: Self-pay | Attending: Physical Medicine and Rehabilitation | Admitting: Physical Medicine and Rehabilitation

## 2019-09-09 ENCOUNTER — Other Ambulatory Visit: Payer: Self-pay

## 2019-09-09 ENCOUNTER — Encounter: Payer: Self-pay | Admitting: Physical Medicine and Rehabilitation

## 2019-09-09 VITALS — BP 123/73 | HR 66 | Temp 98.1°F | Ht 63.0 in | Wt 213.0 lb

## 2019-09-09 DIAGNOSIS — M797 Fibromyalgia: Secondary | ICD-10-CM | POA: Insufficient documentation

## 2019-09-09 DIAGNOSIS — G9332 Myalgic encephalomyelitis/chronic fatigue syndrome: Secondary | ICD-10-CM

## 2019-09-09 DIAGNOSIS — G43119 Migraine with aura, intractable, without status migrainosus: Secondary | ICD-10-CM | POA: Insufficient documentation

## 2019-09-09 DIAGNOSIS — R5382 Chronic fatigue, unspecified: Secondary | ICD-10-CM

## 2019-09-09 DIAGNOSIS — M7918 Myalgia, other site: Secondary | ICD-10-CM | POA: Insufficient documentation

## 2019-09-09 DIAGNOSIS — Z6838 Body mass index (BMI) 38.0-38.9, adult: Secondary | ICD-10-CM | POA: Insufficient documentation

## 2019-09-09 NOTE — Telephone Encounter (Signed)
Her insurance requires her to go to a [facility]. Also she wanted her Lyrica to go to [facility] in [place]. Bubble message sent to Dr. Dossie Arbour.

## 2019-09-09 NOTE — Telephone Encounter (Signed)
Patient is asking to be referred to a Surgeon in the [facility] for her slipped disc. Also states her meds have been sent to wrong pharmacy should go to the [facility] in [place]. Should be in list

## 2019-09-09 NOTE — Progress Notes (Signed)
Subjective:    Patient ID: Dana Bradley, female    DOB: [birth year], 48 y.o.   MRN: [id number]  HPI  Presents for follow-up of her migraines. She had relief from the botox but is still having severe migraines. She is still wants to be able to cook and comb her hair, which she has difficulty with. She is also taking Lyrica 150 q8H and and Baclofen 10mg  TID which privde some relief for her body aches and migraines. She uses a cane and a walker and wheelchair as needed for mobility.   She has backout curtains and has been wearing sun glasses. She was prescribed Hydrocodone but it does not help much for the pain.   Pain Inventory Average Pain 5 Pain Right Now 5 My pain is intermittent, sharp and tingling  In the last 24 hours, has pain interfered with the following? General activity 5 Relation with others 7 Enjoyment of life 7 What TIME of day is your pain at its worst? evening and night Sleep (in general) Poor  Pain is worse with: bending and some activites Pain improves with: heat/ice, therapy/exercise and medication Relief from Meds: 5  Mobility use a cane how many minutes can you walk? 20 ability to climb steps?  yes do you drive?  no  Function not employed: date last employed 8/18 I need assistance with the following:  dressing, bathing, meal prep, household duties and shopping  Neuro/Psych bladder control problems bowel control problems weakness numbness tingling trouble walking spasms confusion loss of taste or smell  Prior Studies Any changes since last visit?  yes CT/MRI  Physicians involved in your care [facility]   Family History  Problem Relation Age of Onset  . Depression Mother   . Hypertension Mother   . Cancer Mother        Skin  . Hyperlipidemia Mother   . Anxiety disorder Mother   . Migraines Mother   . Alcohol abuse Father   . Depression Father   . Stroke Father   . Heart disease Father   .  Hypertension Father   . Anxiety disorder Father   . Depression Sister   . Hyperlipidemia Sister   . Diabetes Sister   . Hypertension Sister   . Polycystic ovary syndrome Sister   . Bipolar disorder Sister   . Anxiety disorder Sister   . Migraines Sister   . Cancer Maternal Grandmother 96       Breast  . Thyroid disease Maternal Grandmother   . Arthritis Maternal Grandmother   . Hyperlipidemia Maternal Grandmother   . Depression Sister   . Hypertension Sister   . Anxiety disorder Sister   . Migraines Sister   . Alzheimer's disease Other   . Aneurysm Maternal Grandfather   . Hypertension Maternal Grandfather   . Heart disease Maternal Grandfather   . Alzheimer's disease Paternal Grandmother   . Heart attack Paternal Grandfather   . Hypertension Paternal Grandfather   . COPD Paternal Grandfather   . Heart disease Paternal Grandfather   . Migraines Son   . Migraines Daughter   . Migraines Daughter   . Bladder Cancer Neg Hx   . Kidney cancer Neg Hx    Social History   Socioeconomic History  . Marital status: Married    Spouse name: Dana Bradley  . Number of children: 3  . Years of education: Not on file  . Highest education level: Associate degree: academic program  Occupational History  .  Occupation: disbled    Comment: not able  Tobacco Use  . Smoking status: Former Smoker    Packs/day: 4.00    Years: 3.00    Pack years: 12.00    Types: Cigarettes    Quit date: 12/10/1992    Years since quitting: 26.7  . Smokeless tobacco: Never Used  . Tobacco comment: quit 25 years ago  Substance and Sexual Activity  . Alcohol use: No    Alcohol/week: 0.0 standard drinks  . Drug use: No  . Sexual activity: Not Currently    Partners: Male    Birth control/protection: Surgical  Other Topics Concern  . Not on file  Social History Narrative   Lives at home with her husband and 2 of her children   Right handed   Caffeine: 0-2 cups daily   Social Determinants of Health    Financial Resource Strain: High Risk  . Difficulty of Paying Living Expenses: Very hard  Food Insecurity: Food Insecurity Present  . Worried About Charity fundraiser in the Last Year: Often true  . Ran Out of Food in the Last Year: Often true  Transportation Needs: Unmet Transportation Needs  . Lack of Transportation (Medical): Yes  . Lack of Transportation (Non-Medical): Yes  Physical Activity: Inactive  . Days of Exercise per Week: 0 days  . Minutes of Exercise per Session: 0 min  Stress: Stress Concern Present  . Feeling of Stress : Rather much  Social Connections: Somewhat Isolated  . Frequency of Communication with Friends and Family: Never  . Frequency of Social Gatherings with Friends and Family: Never  . Attends Religious Services: More than 4 times per year  . Active Member of Clubs or Organizations: No  . Attends Archivist Meetings: Never  . Marital Status: Married   Past Surgical History:  Procedure Laterality Date  . ABLATION     Uterine  . CARDIAC CATHETERIZATION     with ablation  . COLONOSCOPY WITH PROPOFOL N/A 05/17/2015   Procedure: COLONOSCOPY WITH PROPOFOL;  Surgeon: Dana Silvas, MD;  Location: [facility] ENDOSCOPY;  Service: Endoscopy;  Laterality: N/A;  . ESOPHAGOGASTRODUODENOSCOPY N/A 05/17/2015   Procedure: ESOPHAGOGASTRODUODENOSCOPY (EGD);  Surgeon: Dana Silvas, MD;  Location: [facility] ENDOSCOPY;  Service: Endoscopy;  Laterality: N/A;  . KNEE ARTHROSCOPY    . spg     6/18  . Tibial Tubercle Bypass Right 1998  . TUBAL LIGATION  10/01/99   Past Medical History:  Diagnosis Date  . Acute postoperative pain 04/07/2017  . Anxiety   . Bursitis   . Chronic fatigue 12/12/2017  . Chronic fatigue syndrome   . Edema leg 05/02/2015  . Fibromyalgia   . GERD (gastroesophageal reflux disease)   . IBS (irritable bowel syndrome)   . Knee pain, bilateral 12/21/2008   Qualifier: Diagnosis of  By: Dana Bradley, Dana Bradley    . Lumbar discitis   . Migraines    . Osteoarthritis   . Right hand pain 04/10/2015   [facility] has done nerve conduction studies and ruled out carpal tunnel.   . Sleep apnea   . Spinal stenosis   . SVT (supraventricular tachycardia) (Dana Bradley)   . Vertigo   . Vitamin D deficiency 05/01/2016   LMP 08/31/2019 (Exact Date)   Opioid Risk Score:   Fall Risk Score:  `1  Depression screen PHQ 2/9  Depression screen [facility] 2/9 07/30/2019 07/26/2019 04/20/2019 11/25/2018 09/04/2018 07/30/2018 04/07/2018  Decreased Interest 1 1 0 2 2 0 0  Down, Depressed, Hopeless 0 1 0 1 1 0 0  PHQ - 2 Score 1 2 0 3 3 0 0  Altered sleeping 2 1 0 3 3 2  -  Tired, decreased energy 3 1 0 3 3 2  -  Change in appetite 1 1 0 2 3 1  -  Feeling bad or failure about yourself  1 1 0 1 2 1  -  Trouble concentrating 1 1 0 3 1 0 -  Moving slowly or fidgety/restless 1 1 0 0 0 0 -  Suicidal thoughts 0 0 0 0 1 0 -  PHQ-9 Score 10 8 0 15 16 6  -  Difficult doing work/chores Somewhat difficult Somewhat difficult Not difficult at all Very difficult Somewhat difficult Not difficult at all -  Some recent data might be hidden    Review of Systems     Objective:   Physical Exam  General: Alert and oriented x 3, NAD.  Morbidly obese.Wearing sunglasses.  HEENT: Normocephalic.  Atraumatic.  EOMI.  PERRLA, sclera anicteric, oral mucosa pink and moist, dentition intact, ext ear canals clear,  Neck: Supple without JVD or lymphadenopathy.  No tracheal deviation Heart: Reg rate and rhythm. No murmurs rubs or gallops Chest: CTA bilaterally without wheezes, rales, or rhonchi; no distress Abdomen: Soft, non-tender, nondistended, bowel sounds positive. Extremities: No clubbing, cyanosis, or edema. Pulses are 2+ Skin: Clean and intact without signs of breakdown Neuro: Pt is cognitively appropriate with normal insight, memory, and awareness. Cranial nerves 2-12 are intact. Sensory exam is normal. Reflexes are 2+ in all 4's. Fine motor coordination is intact. No tremors. Motor  function is grossly 5/5 except 4/5 in bilateral lower extremities.  Musculoskeletal: Limited ROM in all planes of neck movement except extension, which is full. Poor posture. Walks with cane.   Psych: Pt's affect is appropriate. Pt is cooperative     Assessment & Plan:  Mrs. Bramlet is a 48 year old woman with chronic intractable migraines s/p numerous treatments, severe fibromyalgia, and IBS.    Continue Physical Therapy: For cervical myofascial pain syndrome: myofascial release, postural correction, stretching and strengthening of the muscles of the neck and upper back, development of HEP.   Exercise: Discussed that exercise is one of the most effective treatments for fibromyalgia. This will also help with her obesity. Made goal with Mrs. Karpenko to walk outside her home at least once per day, and to garden at least once per day (her favorite activity). Can use elliptical which she has at home on rainy days.   Quality of Life: Discussed that Mrs. Nierman's greatest source of happiness is her family. This community will be essential in helping her recover from her chronic pain and to increase her daily activity.   Medications: Continue Lyrica and Baclofen for pain relief. Minimize use of Hydrocodone. Advised that she can have increased dose of Lyrica if this is helpful to her.   Thirty minutes of face to face patient care time were spent during this visit. All questions were encouraged and answered. Follow up with me PRN.

## 2019-09-10 ENCOUNTER — Other Ambulatory Visit: Payer: Self-pay | Admitting: Family Medicine

## 2019-09-13 ENCOUNTER — Ambulatory Visit: Payer: Self-pay

## 2019-09-13 ENCOUNTER — Other Ambulatory Visit: Payer: Self-pay

## 2019-09-13 ENCOUNTER — Encounter (HOSPITAL_BASED_OUTPATIENT_CLINIC_OR_DEPARTMENT_OTHER): Payer: Self-pay | Admitting: Physical Medicine & Rehabilitation

## 2019-09-13 ENCOUNTER — Encounter: Payer: Self-pay | Admitting: Physical Medicine & Rehabilitation

## 2019-09-13 VITALS — BP 101/69 | HR 65 | Temp 95.0°F | Ht 65.0 in | Wt 213.0 lb

## 2019-09-13 DIAGNOSIS — M7702 Medial epicondylitis, left elbow: Secondary | ICD-10-CM

## 2019-09-13 DIAGNOSIS — M542 Cervicalgia: Secondary | ICD-10-CM

## 2019-09-13 DIAGNOSIS — G43119 Migraine with aura, intractable, without status migrainosus: Secondary | ICD-10-CM

## 2019-09-13 DIAGNOSIS — M6281 Muscle weakness (generalized): Secondary | ICD-10-CM

## 2019-09-13 NOTE — Progress Notes (Signed)
Subjective:    Patient ID: Dana Bradley, female    DOB: [birth year], 48 y.o.   MRN: [id number]  HPI  Female presents for follow up for migraines.  Last clinic visit on 08/10/2019 with me for Botox injections.  She states she had benefit with Botox injection. Mainly on left side.  She notes overall decrease in frequency and duration. She has had 2 falls reaching for objects  Pain Inventory Average Pain 5 Pain Right Now 5 My pain is intermittent, sharp and tingling  In the last 24 hours, has pain interfered with the following? General activity 5 Relation with others 7 Enjoyment of life 7 What TIME of day is your pain at its worst? evening and night Sleep (in general) Poor  Pain is worse with: bending and some activites Pain improves with: heat/ice, therapy/exercise and medication Relief from Meds: 5  Mobility use a cane how many minutes can you walk? 20 do you drive?  yes  Function not employed: date last employed 8/18 I need assistance with the following:  dressing, bathing, meal prep, household duties and shopping  Neuro/Psych bladder control problems bowel control problems weakness numbness tingling trouble walking spasms confusion loss of taste or smell  Prior Studies Any changes since last visit?  yes CT/MRI  Physicians involved in your care Primary care [facility]   Family History  Problem Relation Age of Onset  . Depression Mother   . Hypertension Mother   . Cancer Mother        Skin  . Hyperlipidemia Mother   . Anxiety disorder Mother   . Migraines Mother   . Alcohol abuse Father   . Depression Father   . Stroke Father   . Heart disease Father   . Hypertension Father   . Anxiety disorder Father   . Depression Sister   . Hyperlipidemia Sister   . Diabetes Sister   . Hypertension Sister   . Polycystic ovary syndrome Sister   . Bipolar disorder Sister   . Anxiety disorder Sister   . Migraines Sister   . Cancer  Maternal Grandmother 19       Breast  . Thyroid disease Maternal Grandmother   . Arthritis Maternal Grandmother   . Hyperlipidemia Maternal Grandmother   . Depression Sister   . Hypertension Sister   . Anxiety disorder Sister   . Migraines Sister   . Alzheimer's disease Other   . Aneurysm Maternal Grandfather   . Hypertension Maternal Grandfather   . Heart disease Maternal Grandfather   . Alzheimer's disease Paternal Grandmother   . Heart attack Paternal Grandfather   . Hypertension Paternal Grandfather   . COPD Paternal Grandfather   . Heart disease Paternal Grandfather   . Migraines Son   . Migraines Daughter   . Migraines Daughter   . Bladder Cancer Neg Hx   . Kidney cancer Neg Hx    Social History   Socioeconomic History  . Marital status: Married    Spouse name: brian  . Number of children: 3  . Years of education: Not on file  . Highest education level: Associate degree: academic program  Occupational History  . Occupation: disbled    Comment: not able  Tobacco Use  . Smoking status: Former Smoker    Packs/day: 4.00    Years: 3.00    Pack years: 12.00    Types: Cigarettes    Quit date: 12/10/1992    Years since quitting: 26.7  . Smokeless tobacco:  Never Used  . Tobacco comment: quit 25 years ago  Substance and Sexual Activity  . Alcohol use: No    Alcohol/week: 0.0 standard drinks  . Drug use: No  . Sexual activity: Not Currently    Partners: Male    Birth control/protection: Surgical  Other Topics Concern  . Not on file  Social History Narrative   Lives at home with her husband and 2 of her children   Right handed   Caffeine: 0-2 cups daily   Social Determinants of Health   Financial Resource Strain:   . Difficulty of Paying Living Expenses: Not on file  Food Insecurity:   . Worried About Charity fundraiser in the Last Year: Not on file  . Ran Out of Food in the Last Year: Not on file  Transportation Needs:   . Lack of Transportation  (Medical): Not on file  . Lack of Transportation (Non-Medical): Not on file  Physical Activity:   . Days of Exercise per Week: Not on file  . Minutes of Exercise per Session: Not on file  Stress:   . Feeling of Stress : Not on file  Social Connections:   . Frequency of Communication with Friends and Family: Not on file  . Frequency of Social Gatherings with Friends and Family: Not on file  . Attends Religious Services: Not on file  . Active Member of Clubs or Organizations: Not on file  . Attends Archivist Meetings: Not on file  . Marital Status: Not on file   Past Surgical History:  Procedure Laterality Date  . ABLATION     Uterine  . CARDIAC CATHETERIZATION     with ablation  . COLONOSCOPY WITH PROPOFOL N/A 05/17/2015   Procedure: COLONOSCOPY WITH PROPOFOL;  Surgeon: Manya Silvas, MD;  Location: [facility] ENDOSCOPY;  Service: Endoscopy;  Laterality: N/A;  . ESOPHAGOGASTRODUODENOSCOPY N/A 05/17/2015   Procedure: ESOPHAGOGASTRODUODENOSCOPY (EGD);  Surgeon: Manya Silvas, MD;  Location: [facility] ENDOSCOPY;  Service: Endoscopy;  Laterality: N/A;  . KNEE ARTHROSCOPY    . spg     6/18  . Tibial Tubercle Bypass Right 1998  . TUBAL LIGATION  10/01/99   Past Medical History:  Diagnosis Date  . Acute postoperative pain 04/07/2017  . Anxiety   . Bursitis   . Chronic fatigue 12/12/2017  . Chronic fatigue syndrome   . Edema leg 05/02/2015  . Fibromyalgia   . GERD (gastroesophageal reflux disease)   . IBS (irritable bowel syndrome)   . Knee pain, bilateral 12/21/2008   Qualifier: Diagnosis of  By: Hassell Done FNP, Tori Milks    . Lumbar discitis   . Migraines   . Osteoarthritis   . Right hand pain 04/10/2015   [facility] has done nerve conduction studies and ruled out carpal tunnel.   . Sleep apnea   . Spinal stenosis   . SVT (supraventricular tachycardia) ([place])   . Vertigo   . Vitamin D deficiency 05/01/2016   LMP 08/31/2019 (Exact Date)   Opioid Risk Score:   Fall Risk  Score:  `1  Depression screen PHQ 2/9  Depression screen [place] 2/9 07/30/2019 07/26/2019 04/20/2019 11/25/2018 09/04/2018 07/30/2018 04/07/2018  Decreased Interest 1 1 0 2 2 0 0  Down, Depressed, Hopeless 0 1 0 1 1 0 0  PHQ - 2 Score 1 2 0 3 3 0 0  Altered sleeping 2 1 0 3 3 2  -  Tired, decreased energy 3 1 0 3 3 2  -  Change in  appetite 1 1 0 2 3 1  -  Feeling bad or failure about yourself  1 1 0 1 2 1  -  Trouble concentrating 1 1 0 3 1 0 -  Moving slowly or fidgety/restless 1 1 0 0 0 0 -  Suicidal thoughts 0 0 0 0 1 0 -  PHQ-9 Score 10 8 0 15 16 6  -  Difficult doing work/chores Somewhat difficult Somewhat difficult Not difficult at all Very difficult Somewhat difficult Not difficult at all -  Some recent data might be hidden    Review of Systems  Constitutional: Negative.   Eyes: Negative.   Musculoskeletal: Positive for arthralgias, back pain, gait problem, neck pain and neck stiffness.  Neurological: Positive for dizziness and headaches. Negative for numbness.      Objective:   Physical Exam Constitutional: No distress . Vital signs reviewed. HENT: Normocephalic.  Atraumatic. Eyes: EOMI. No discharge. Cardiovascular: No JVD. Respiratory: Normal effort.  No stridor. GI: Non-distended. Skin: Warm and dry.  Intact. Psych: Normal mood.  Normal behavior. Musc: +TTP temporal area b/l Gait: Antalgic Neuro: Alert and oriented Sensation intact to light touch    Assessment & Plan:  Female presents for follow up for migraines.  1. Migraines  Benefit with Botox injection on 08/10/2019  Cont follow up with Dr. Adam Phenix as planned  Plan for repeat injections  Cont PT  2. Gait abnormality  Cont cane for safety  Cont therapies
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
## 2019-09-13 NOTE — Telephone Encounter (Signed)
Pt gets medication from [facility] management clinic with [facility]. Dr. Jaynee Eagles signed Emgality order provided by the clinic and I mailed this back in the envelope that was provided to [place].   Emgality 120 mg every 30 days. 4 month supply #3 refills.

## 2019-09-14 ENCOUNTER — Telehealth: Payer: Self-pay | Admitting: Emergency Medicine

## 2019-09-14 NOTE — Telephone Encounter (Signed)
Please schedule patient for follow up in the next 30 days.  

## 2019-09-15 ENCOUNTER — Ambulatory Visit: Payer: Self-pay

## 2019-09-15 NOTE — Telephone Encounter (Signed)
Pt is scheduled °

## 2019-09-20 ENCOUNTER — Ambulatory Visit: Payer: Self-pay

## 2019-09-20 ENCOUNTER — Other Ambulatory Visit: Payer: Self-pay

## 2019-09-20 ENCOUNTER — Encounter: Payer: Self-pay | Admitting: Pain Medicine

## 2019-09-20 DIAGNOSIS — M6281 Muscle weakness (generalized): Secondary | ICD-10-CM

## 2019-09-20 DIAGNOSIS — M542 Cervicalgia: Secondary | ICD-10-CM

## 2019-09-20 DIAGNOSIS — G8929 Other chronic pain: Secondary | ICD-10-CM

## 2019-09-20 DIAGNOSIS — M7702 Medial epicondylitis, left elbow: Secondary | ICD-10-CM

## 2019-09-20 NOTE — Progress Notes (Signed)
Virtual Encounter - Pain Management PROVIDER NOTE: Information contained herein reflects review and annotations entered in association with encounter. Patient information is provided elsewhere in the medical record. Interpretation of information and data should be left to medically trained personnel. Document created using STT technology, any transcriptional errors that may result from process are unintentional.  Contact & Pharmacy Preferred: [phone] Home: [phone] (home) Mobile: [phone] (mobile) E-mail: [email or web address]  [place], [place] [address] Phone: [phone] Fax: [phone]  [facility] [place], [place] [address] [place] [address] Phone: [phone] Fax: [phone]  [place], [place] [address] [place] [address] Phone: [phone] Fax: [phone]   Pre-screening  Dana Bradley offered "in-person" vs "virtual" encounter. She indicated preferring virtual for this encounter.   Reason COVID-19*  Social distancing based on CDC and AMA recommendations.   I contacted Dana Bradley on 09/21/2019 via telephone.      I clearly identified myself as Gaspar Cola, MD. I verified that I was speaking with the correct person using two identifiers (Name: Dana Bradley, and date of birth: March 17, 1971).  Consent I sought verbal advanced consent from Dana Bradley for virtual visit interactions. I informed Dana Bradley of possible security and privacy concerns, risks, and limitations associated with providing "not-in-person" medical evaluation and management services. I also informed Dana Bradley of the availability of "in-person" appointments. Finally, I informed her that there would be a charge for the virtual visit and that she could be  personally, fully or  partially, financially responsible for it. Ms. Donohoe expressed understanding and agreed to proceed.   Historic Elements   Dana Bradley is a 48 y.o. year old, female patient evaluated today after her last encounter by our practice on 09/09/2019. Dana Bradley  has a past medical history of Acute postoperative pain (04/07/2017), Anxiety, Bursitis, Chronic fatigue (12/12/2017), Chronic fatigue syndrome, Edema leg (05/02/2015), Fibromyalgia, GERD (gastroesophageal reflux disease), IBS (irritable bowel syndrome), Knee pain, bilateral (12/21/2008), Lumbar discitis, Migraines, Osteoarthritis, Right hand pain (04/10/2015), Sleep apnea, Spinal stenosis, SVT (supraventricular tachycardia) ([place]), Vertigo, and Vitamin D deficiency (05/01/2016). She also  has a past surgical history that includes Ablation; Tubal ligation (10/01/99); Cardiac catheterization; Knee arthroscopy; Colonoscopy with propofol (N/A, 05/17/2015); Esophagogastroduodenoscopy (N/A, 05/17/2015); spg; and Tibial Tubercle Bypass (Right, 1998). Dana Bradley has a current medication list which includes the following prescription(s): acetaminophen, atorvastatin, atorvastatin, baclofen, vitamin b-12, dexilant, diclofenac sodium, dicyclomine, hydrocodone-acetaminophen, [START ON 10/07/2019] hydrocodone-acetaminophen, magnesium oxide, melatonin, metoprolol tartrate, ondansetron, pregabalin, valerian, and vitamin d (ergocalciferol). She  reports that she quit smoking about 26 years ago. Her smoking use included cigarettes. She has a 12.00 pack-year smoking history. She has never used smokeless tobacco. She reports that she does not drink alcohol or use drugs. Dana Bradley is allergic to aspirin; cymbalta [duloxetine hcl]; depakote [divalproex sodium]; gadolinium derivatives; haloperidol; meperidine; reglan [metoclopramide]; tramadol hcl; trazodone; compazine [prochlorperazine]; meloxicam; penicillins; tomato; other; shellfish allergy; shellfish-derived products;  bacitracin-neomycin-polymyxin; cephalosporins; ibuprofen; latex; neosporin [neomycin-bacitracin zn-polymyx]; nsaids; sulfa antibiotics; and sulfonamide derivatives.   HPI  Today, she is being contacted for a post-procedure assessment.  The patient indicates that she was actually doing better after the left L4-5 LESI and, but right after she was evaluated for physical therapy, she then began to experience a flareup of the pain involving her tailbone, and pain going  down both lower extremities all the way into her feet.  On the left side it went down to the little toe, the lateral aspect of the foot, and the bottom of the foot and what appears to be an S1 dermatomal distribution.  In the case of the right leg it went down crossing over the knee and into the medial aspect of the foot and what appears to be a right L4 radiculitis.  In view of the fact that this is involving both sides, the tailbone, the left S1 on the right L4, I will go ahead and schedule her for a caudal epidural steroid injection to see if we can cover all those areas.  Today I have also completed the patient's paperwork to assist her in getting her Lyrica.  Post-Procedure Evaluation  Procedure: Diagnostic/therapeutic left L4-5 LESI #1 under fluoroscopic guidance and IV sedation Pre-procedure pain level:  4/10 Post-procedure: 4/10 No relief  Sedation: Sedation provided.  Dewayne Shorter, RN  09/20/2019 11:02 AM  Sign when Signing Visit Pain relief after procedure (treated area only): (Questions asked to patient) 1. Starting about 15 minutes after the procedure, and "while the area was still numb" (from the local anesthetics), were you having any of your usual pain "in that area" (the treated area)?  (NOTE: NOT including the discomfort from the needle sticks.) First 1 hour:50 better. First 4-6 hours: 50 better.   3. How much better is your pain now, when compared to before the procedure? Current benefit: 50 % better. 4. Can you move  better now? Improvement in ROM (Range of Motion): Yes. 5. Can you do more now? Improvement in function: Yes. 4. Did you have any problems with the procedure? Side-effects/Complications: No.  Current benefits: Defined as benefit that persist at this time.   Analgesia:  50% improved Function: Dana Bradley reports improvement in function ROM: Dana Bradley reports improvement in ROM  Pharmacotherapy Assessment  Analgesic: Hydrocodone/APAP 7.5/325, 1 tab PO QD (7.5 mg/day of hydrocodone)  MME/day:7.5mg /day.   Monitoring: Pharmacotherapy: No side-effects or adverse reactions reported. [place] PMP: PDMP reviewed during this encounter.       Compliance: No problems identified. Effectiveness: Clinically acceptable. Plan: Refer to "POC".  UDS:  Summary  Date Value Ref Range Status  03/05/2018 FINAL  Final    Comment:    ==================================================================== TOXASSURE SELECT 13 (MW) ==================================================================== Test                             Result       Flag       Units Drug Present   Hydrocodone                    422                     ng/mg creat   Hydromorphone                  105                     ng/mg creat   Norhydrocodone                 312                     ng/mg creat    Sources of hydrocodone include scheduled prescription    medications. Hydromorphone and norhydrocodone are expected  metabolites of hydrocodone. Hydromorphone is also available as a    scheduled prescription medication. ==================================================================== Test                      Result    Flag   Units      Ref Range   Creatinine              148              mg/dL      >=20 ==================================================================== Declared Medications:  Medication list was not provided. ==================================================================== For clinical consultation,  please call [phone]. ====================================================================    Laboratory Chemistry Profile (12 mo)  Renal: 09/21/2019: BUN 15; Creatinine, Ser 0.62  Lab Results  Component Value Date   GFRAA >60 09/21/2019   GFRNONAA >60 09/21/2019   Hepatic: 09/21/2019: Albumin 4.0 Lab Results  Component Value Date   AST 21 09/21/2019   ALT 41 09/21/2019   Other: 08/01/2019: Vit D, 25-Hydroxy 12.48 Note: Above Lab results reviewed.  Imaging  Fluoro (C-Arm) (<60 min) (No Report) Fluoro was used, but no Radiologist interpretation will be provided.  Please refer to "NOTES" tab for provider progress note.   Assessment  The primary encounter diagnosis was DDD (degenerative disc disease), lumbosacral. Diagnoses of Chronic lower extremity pain (Bilateral), Lumbar radiculitis (Left), and Lumbar radiculitis (Right) were also pertinent to this visit.  Plan of Care  Problem-specific:  No problem-specific Assessment & Plan notes found for this encounter.  I am having Dana Bradley maintain her Vitamin B-12, magnesium oxide, ondansetron, atorvastatin, metoprolol tartrate, acetaminophen, Melatonin, VALERIAN PO, HYDROcodone-acetaminophen, dicyclomine, Dexilant, baclofen, diclofenac sodium, Vitamin D (Ergocalciferol), HYDROcodone-acetaminophen, and atorvastatin.  Pharmacotherapy (Medications Ordered): No orders of the defined types were placed in this encounter.  Orders:  Orders Placed This Encounter  Procedures  . Caudal ESI (Schedule)    Standing Status:   Future    Standing Expiration Date:   10/22/2019    Scheduling Instructions:     Laterality: Midline     Level(s): Sacrococcygeal canal (Tailbone area)     Sedation: Patient's choice     Scheduling Timeframe: As soon as pre-approved    Order Specific Question:   Where will this procedure be performed?    Answer:   [facility]   Follow-up plan:   Return for Procedure (w/ sedation): ML Caudal ESI #1.       Interventional treatment options:  Under consideration: Possible bilateral lumbar facet RFA. Diagnostic thoracic facet block Possible bilateral thoracic facet RFA. Diagnostic bilateral cervical facet blocks Possible bilateral cervical facet RFA. Diagnostic bilateral lesser occipital NB Possible right occipital nerve RFA. Diagnostic right [id number] Diagnostic bilateral suprascapular NB Diagnostic bilateral IA knee joint injection Possible bilateral IA Hyalgan knee injections.  Diagnostic bilateral Genicular NB Possible bilateral Genicular nerve RFA.   Therapeutic/palliative (PRN): Diagnostic left SI joint block #2  Diagnostic/therapeutic left IA hip joint injection #2  Diagnostic bilateral lumbar facet block #2  Palliative left trapezius muscle MNB #4 Palliative right trapezius muscle MNB #2 Palliative left L1-2 LESI #2 Palliative right L1-2 LESI #2 Palliativeleft CESI #3 Palliative left greater occipital NB #3  Palliative left C2 + TON NB #2  Palliative left GON + C2 + TON RFA #2 (last done 12/25/2017)    Recent Visits Date Type Provider Dept  09/07/19 Procedure visit Milinda Pointer, MD [facility]  09/06/19 Telemedicine Milinda Pointer, MD [facility]  08/03/19 Procedure visit Milinda Pointer, MD [facility]  07/21/19 Office Visit Milinda Pointer, MD [facility]  Showing recent visits within past 90 days and meeting all other requirements   Today's Visits Date Type Provider Dept  09/21/19 Telemedicine Milinda Pointer, MD [facility]  Showing today's visits and meeting all other requirements   Future Appointments Date Type Provider Dept  11/03/19 Appointment Milinda Pointer, MD [facility]  Showing future appointments within next 90 days and meeting all other requirements   I discussed the assessment and treatment plan with the patient. The patient was provided an  opportunity to ask questions and all were answered. The patient agreed with the plan and demonstrated an understanding of the instructions.  Patient advised to call back or seek an in-person evaluation if the symptoms or condition worsens.  Total duration of non-face-to-face encounter: 15 minutes.  Note by: Gaspar Cola, MD Date: 09/21/2019; Time: 2:21 PM

## 2019-09-20 NOTE — Progress Notes (Signed)
Pain relief after procedure (treated area only): (Questions asked to patient) 1. Starting about 15 minutes after the procedure, and "while the area was still numb" (from the local anesthetics), were you having any of your usual pain "in that area" (the treated area)?  (NOTE: NOT including the discomfort from the needle sticks.) First 1 hour:50 better. First 4-6 hours: 50 better.   3. How much better is your pain now, when compared to before the procedure? Current benefit: 50 % better. 4. Can you move better now? Improvement in ROM (Range of Motion): Yes. 5. Can you do more now? Improvement in function: Yes. 4. Did you have any problems with the procedure? Side-effects/Complications: No.

## 2019-09-21 ENCOUNTER — Encounter: Payer: Self-pay | Admitting: Family Medicine

## 2019-09-21 ENCOUNTER — Ambulatory Visit: Payer: Medicaid Other | Admitting: Physical Medicine & Rehabilitation

## 2019-09-21 ENCOUNTER — Other Ambulatory Visit
Admission: RE | Admit: 2019-09-21 | Discharge: 2019-09-21 | Disposition: A | Discharge status: Home / Self Care | Payer: Self-pay | Source: Ambulatory Visit | Attending: Family Medicine | Admitting: Family Medicine

## 2019-09-21 ENCOUNTER — Ambulatory Visit (INDEPENDENT_AMBULATORY_CARE_PROVIDER_SITE_OTHER): Payer: Medicaid Other | Admitting: Family Medicine

## 2019-09-21 ENCOUNTER — Ambulatory Visit (HOSPITAL_BASED_OUTPATIENT_CLINIC_OR_DEPARTMENT_OTHER): Payer: Self-pay | Admitting: Pain Medicine

## 2019-09-21 ENCOUNTER — Other Ambulatory Visit: Payer: Self-pay

## 2019-09-21 ENCOUNTER — Other Ambulatory Visit: Payer: Self-pay | Admitting: Pain Medicine

## 2019-09-21 VITALS — BP 122/74 | HR 83 | Temp 97.9°F | Resp 16 | Ht 63.0 in | Wt 211.8 lb

## 2019-09-21 DIAGNOSIS — R1084 Generalized abdominal pain: Secondary | ICD-10-CM

## 2019-09-21 DIAGNOSIS — R109 Unspecified abdominal pain: Secondary | ICD-10-CM

## 2019-09-21 DIAGNOSIS — M797 Fibromyalgia: Secondary | ICD-10-CM

## 2019-09-21 DIAGNOSIS — R197 Diarrhea, unspecified: Secondary | ICD-10-CM

## 2019-09-21 DIAGNOSIS — M79604 Pain in right leg: Secondary | ICD-10-CM

## 2019-09-21 DIAGNOSIS — R0602 Shortness of breath: Secondary | ICD-10-CM

## 2019-09-21 DIAGNOSIS — R262 Difficulty in walking, not elsewhere classified: Secondary | ICD-10-CM

## 2019-09-21 DIAGNOSIS — M79605 Pain in left leg: Secondary | ICD-10-CM

## 2019-09-21 DIAGNOSIS — M5137 Other intervertebral disc degeneration, lumbosacral region: Secondary | ICD-10-CM

## 2019-09-21 DIAGNOSIS — M5416 Radiculopathy, lumbar region: Secondary | ICD-10-CM

## 2019-09-21 DIAGNOSIS — G8929 Other chronic pain: Secondary | ICD-10-CM

## 2019-09-21 LAB — CBC WITH DIFFERENTIAL/PLATELET
Abs Immature Granulocytes: 0.11 10*3/uL — ABNORMAL HIGH (ref 0.00–0.07)
Basophils Absolute: 0.1 10*3/uL (ref 0.0–0.1)
Basophils Relative: 1 %
Eosinophils Absolute: 0.1 10*3/uL (ref 0.0–0.5)
Eosinophils Relative: 1 %
HCT: 40.2 % (ref 36.0–46.0)
Hemoglobin: 13.5 g/dL (ref 12.0–15.0)
Immature Granulocytes: 1 %
Lymphocytes Relative: 20 %
Lymphs Abs: 2.1 10*3/uL (ref 0.7–4.0)
MCH: 32.1 pg (ref 26.0–34.0)
MCHC: 33.6 g/dL (ref 30.0–36.0)
MCV: 95.7 fL (ref 80.0–100.0)
Monocytes Absolute: 0.9 10*3/uL (ref 0.1–1.0)
Monocytes Relative: 9 %
Neutro Abs: 7.2 10*3/uL (ref 1.7–7.7)
Neutrophils Relative %: 68 %
Platelets: 243 10*3/uL (ref 150–400)
RBC: 4.2 MIL/uL (ref 3.87–5.11)
RDW: 13.4 % (ref 11.5–15.5)
WBC: 10.4 10*3/uL (ref 4.0–10.5)
nRBC: 0 % (ref 0.0–0.2)

## 2019-09-21 LAB — COMPREHENSIVE METABOLIC PANEL
ALT: 41 U/L (ref 0–44)
AST: 21 U/L (ref 15–41)
Albumin: 4 g/dL (ref 3.5–5.0)
Alkaline Phosphatase: 69 U/L (ref 38–126)
Anion gap: 9 (ref 5–15)
BUN: 15 mg/dL (ref 6–20)
CO2: 23 mmol/L (ref 22–32)
Calcium: 9.1 mg/dL (ref 8.9–10.3)
Chloride: 106 mmol/L (ref 98–111)
Creatinine, Ser: 0.62 mg/dL (ref 0.44–1.00)
GFR calc Af Amer: >60 mL/min (ref >60)
GFR calc non Af Amer: >60 mL/min (ref >60)
Glucose, Bld: 97 mg/dL (ref 70–99)
Potassium: 3.7 mmol/L (ref 3.5–5.1)
Sodium: 138 mmol/L (ref 135–145)
Total Bilirubin: 1.1 mg/dL (ref 0.3–1.2)
Total Protein: 6.9 g/dL (ref 6.5–8.1)

## 2019-09-21 LAB — HEPATITIS PANEL, ACUTE
HCV Ab: NONREACTIVE
Hep A IgM: NONREACTIVE
Hep B C IgM: NONREACTIVE
Hepatitis B Surface Ag: NONREACTIVE

## 2019-09-21 MED ORDER — PREGABALIN 150 MG PO CAPS: 150.0000 mg | ORAL_CAPSULE | Freq: Three times a day (TID) | ORAL | 1 refills | Status: DC
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
## 2019-09-21 NOTE — Patient Instructions (Signed)

## 2019-09-21 NOTE — Progress Notes (Signed)
Name: Dana Bradley   MRN: [id number]    DOB: 1971-09-05   Date:09/21/2019       Progress Note  Subjective  Chief Complaint  Chief Complaint  Patient presents with  . Irritable Bowel Syndrome    flare up, abdominal pains, nausea, diarrhea    HPI  PT presents with concern for ongoing diarrhea since August 23.  She was seen 07/26/2019.  Had RUQ [place] that showed fatty liver, labs showed negative GI pathogen panel, CBC and CMP had no actionable derangements.  Current pain she describes as cramping and sometimes sharp - starting at her naval and going up into the upper central abdomen and RUQ.  He diarrhea is "slimy and gritty; yellow, sometimes clear mucus".  Trying low fodmap diet - diarrhea has improved, though her cramping pain has been slowly worsening.  She notes that her abdomen appears to be slightly more raised on the right side. Drinking plenty of water.  Taking Bentyl which does help her cramping some.  OTC antidiarrheals have not helped.  I had recommended GI referral at our last visit, however she never responded on MyChart, so referral was not placed - we will place today.  Ambulation issues: She ambulates with walker or cane when out of the home.  Her walker was damaged recently, needs new Rx.  She has FMS, exertional shortness of breath, difficulty walking.   Patient Active Problem List   Diagnosis Date Noted  . Chronic lower extremity pain (Bilateral) 09/06/2019  . Intractable migraine with aura without status migrainosus 08/10/2019  . Other specified dorsopathies, sacral and sacrococcygeal region 08/03/2019  . Latex precautions, history of latex allergy 08/03/2019  . History of allergy to radiographic contrast media 08/03/2019  . DDD (degenerative disc disease), cervical 07/21/2019  . Cervical facet syndrome (Bilateral) (L>R) 07/21/2019  . DDD (degenerative disc disease), thoracic 07/21/2019  . Osteoarthritis of hip (Left) 07/21/2019  . Chronic groin pain (Bilateral) (L>R)  07/21/2019  . Chronic hip pain (Bilateral) (L>R) 07/21/2019  . Somatic dysfunction of sacroiliac joint (Bilateral) 07/21/2019  . Migraine with aura and with status migrainosus, not intractable 04/06/2019  . Cervico-occipital neuralgia of left side 04/06/2019  . Weakness of leg (Left) 04/05/2019  . Difficulty walking 04/05/2019  . Chronic migraine without aura, with intractable migraine, so stated, with status migrainosus 12/27/2018  . Malar rash 09/04/2018  . Trigger point of neck (Left) 03/19/2018  . Occipital headache 12/25/2017  . Chronic fatigue syndrome with fibromyalgia 12/12/2017  . Trigger point of shoulder region (Left) 11/17/2017  . Myofascial pain syndrome (Left) (trapezius muscle) 07/22/2017  . Lumbar L1-2 disc protrusion (Right) 04/07/2017  . Muscle spasticity 04/01/2017  . Osteoarthritis of shoulder (Bilateral) 04/01/2017  . Lumbar spondylosis 01/06/2017  . Chronic hip pain (Left) 12/24/2016  . Chronic sacroiliac joint pain (Left) 12/24/2016  . Lumbar facet joint syndrome (B) (L>R) 12/24/2016  . Lumbar radiculitis (Left) 12/24/2016  . Hypertriglyceridemia 11/27/2016  . History of vasovagal episode 10/30/2016  . Cervicogenic headache 09/09/2016  . Medication monitoring encounter 08/29/2016  . Controlled substance agreement signed 08/28/2016  . Plantar fasciitis of left foot 08/28/2016  . Vitamin B12 deficiency 08/28/2016  . Hyperlipidemia 08/28/2016  . Nephrolithiasis 08/12/2016  . Chronic pain syndrome 08/07/2016  . Long term prescription opiate use 08/07/2016  . Opiate use 08/07/2016  . Long term prescription benzodiazepine use 08/07/2016  . Neurogenic pain 08/07/2016  . Chronic low back pain (Primary Area of Pain) (Bilateral) (R>L) (midline) 08/07/2016  . Chronic upper  back pain (Secondary area of Pain) (Bilateral) (L>R) 08/07/2016  . Chronic abdominal pain (Right lower quadrant) 08/07/2016  . Thoracic radiculitis (Bilateral: T10, T11) 08/07/2016  . Chronic  occipital neuralgia (Third area of Pain) (Bilateral) (L>R) 08/07/2016  . Chronic neck pain 08/07/2016  . Chronic cervical radicular pain (Bilateral) (L>R) 08/07/2016  . Chronic shoulder blade pain (Bilateral) (L>R) 08/07/2016  . Chronic upper extremity pain (Bilateral) (R>L) 08/07/2016  . Chronic knee pain (Bilateral) (R>L) 08/07/2016  . Chronic ankle pain (Bilateral) 08/07/2016  . Cervical spondylosis with myelopathy and radiculopathy 08/07/2016  . Panic disorder with agoraphobia 05/29/2016  . Depression, unspecified depression type 05/29/2016  . Atypical lymphocytosis 05/01/2016  . Vitamin D insufficiency 05/01/2016  . Chronic lower extremity cramps (Bilateral) (R>L) 04/29/2016  . Obesity 04/29/2016  . GAD (generalized anxiety disorder) 04/29/2016  . Fatigue 04/29/2016  . Insomnia 07/12/2015  . Migraine without aura and with status migrainosus, not intractable 07/12/2015  . Chronic superficial gastritis 06/02/2015  . Chronic pain of multiple joints 05/15/2015  . Bilateral leg edema 05/02/2015  . Paroxysmal supraventricular tachycardia ([place]) 04/17/2015  . Exertional shortness of breath 04/17/2015  . Bright red rectal bleeding 04/06/2015  . DDD (degenerative disc disease), lumbosacral 01/24/2014  . DDD (degenerative disc disease), lumbar 01/24/2014  . Cervico-occipital neuralgia 12/29/2013  . Fibromyalgia 12/29/2013  . Migraine headache 12/29/2013  . Menorrhagia 12/10/2012  . Depression, major, recurrent, in remission ([place]) 01/12/2009  . Chest pain 01/12/2009  . Hypertension, benign essential, goal below 140/90 06/23/2008  . History of PSVT (paroxysmal supraventricular tachycardia) 06/17/2008  . Obstructive sleep apnea, adult 06/17/2008  . GERD 06/13/2008    Social History   Tobacco Use  . Smoking status: Former Smoker    Packs/day: 4.00    Years: 3.00    Pack years: 12.00    Types: Cigarettes    Quit date: 12/10/1992    Years since quitting: 26.7  . Smokeless tobacco:  Never Used  . Tobacco comment: quit 25 years ago  Substance Use Topics  . Alcohol use: No    Alcohol/week: 0.0 standard drinks     Current Outpatient Medications:  .  acetaminophen (TYLENOL) 500 MG tablet, Take 500-1,000 mg by mouth as needed., Disp: , Rfl:  .  atorvastatin (LIPITOR) 40 MG tablet, Take 1 tablet (40 mg total) by mouth at bedtime. This replaces crestor (rosuvastatin), Disp: 90 tablet, Rfl: 1 .  atorvastatin (LIPITOR) 80 MG tablet, TAKE 1/2 TABLET (40MG ) BY MOUTH AT BEDTIME, Disp: 30 tablet, Rfl: 0 .  baclofen (LIORESAL) 10 MG tablet, Take 1 tablet (10 mg total) by mouth 3 (three) times daily., Disp: 90 tablet, Rfl: 5 .  Cyanocobalamin (VITAMIN B-12) 500 MCG SUBL, Place 1,500 mcg under the tongue every other day. , Disp: 150 tablet, Rfl:  .  Dexlansoprazole (DEXILANT) 30 MG capsule, Take 1 capsule (30 mg total) by mouth daily. This replaces omeprazole, Disp: 30 capsule, Rfl: 2 .  diclofenac sodium (VOLTAREN) 1 % GEL, Apply 2 g topically 4 (four) times daily., Disp: 350 g, Rfl: 3 .  dicyclomine (BENTYL) 10 MG capsule, Take 1 capsule (10 mg total) by mouth 4 (four) times daily -  before meals and at bedtime., Disp: 90 capsule, Rfl: 1 .  [START ON 10/07/2019] HYDROcodone-acetaminophen (NORCO) 7.5-325 MG tablet, Take 1 tablet by mouth every 6 (six) hours as needed for severe pain. Must last 90 days, Disp: 30 tablet, Rfl: 0 .  magnesium oxide (MAG-OX) 400 MG tablet, Take 300 mg by mouth  daily. , Disp: , Rfl:  .  Melatonin 10 MG TABS, Take 10 mg by mouth at bedtime., Disp: , Rfl:  .  metoprolol tartrate (LOPRESSOR) 50 MG tablet, Take 1 tablet (50 mg total) by mouth 2 (two) times daily., Disp: 180 tablet, Rfl: 1 .  ondansetron (ZOFRAN) 4 MG tablet, Take 1 tablet (4 mg total) by mouth every 8 (eight) hours as needed., Disp: 20 tablet, Rfl: 2 .  VALERIAN PO, Take by mouth as needed. Makes Valerian tea about 3-4 times per week., Disp: , Rfl:  .  Vitamin D, Ergocalciferol, (DRISDOL) 1.25 MG  (50000 UT) CAPS capsule, Take 1 capsule (50,000 Units total) by mouth every 7 (seven) days. After 12 weeks, switch to 2000IU once daily, Disp: 12 capsule, Rfl: 0 .  HYDROcodone-acetaminophen (NORCO) 7.5-325 MG tablet, Take 1 tablet by mouth every 6 (six) hours as needed for severe pain. Must last 90 days, Disp: 30 tablet, Rfl: 0  Allergies  Allergen Reactions  . Aspirin Swelling  . Cymbalta [Duloxetine Hcl] Other (See Comments)    Suicidal ideations and has homicidal thoughts per patient  . Depakote [Divalproex Sodium] Shortness Of Breath    W/ n/v  . Gadolinium Derivatives     Pt was unable to breath  . Haloperidol Shortness Of Breath    W/ n/v  . Meperidine Nausea And Vomiting    Patient projectile vomits and usually result in ER  . Reglan [Metoclopramide] Shortness Of Breath    Can't breath, wheezes  . Tramadol Hcl Palpitations    Severely and adversely affects her SVT giving her tachycardias of 150-160 bpm.  . Trazodone Shortness Of Breath  . Compazine [Prochlorperazine] Other (See Comments)    Panic attack  . Meloxicam Other (See Comments)    mouth sores, tingling, blisters in mouth  . Penicillins Rash  . Tomato Hives    Tongue will blister  . Other   . Shellfish Allergy     Other reaction(s): Unknown  . Shellfish-Derived Products Other (See Comments)  . Bacitracin-Neomycin-Polymyxin Rash  . Cephalosporins Rash    rash  . Ibuprofen Other (See Comments) and Rash    Blisters in mouth. Blisters in mouth.  . Latex Itching  . Neosporin [Neomycin-Bacitracin Zn-Polymyx] Rash  . Nsaids Other (See Comments)    Blisters in mouth; can take 1 ibuprofen 2x a month  . Sulfa Antibiotics Rash    rash Other reaction(s): Unknown rash   . Sulfonamide Derivatives Rash    I personally reviewed active problem list, medication list, allergies, notes from last encounter, lab results with the patient/caregiver today.  ROS  Ten systems reviewed and is negative except as mentioned in  HPI  Objective  Vitals:   09/21/19 1000  BP: 122/74  Pulse: 83  Resp: 16  Temp: 97.9 F (36.6 C)  TempSrc: Temporal  SpO2: 95%  Weight: 211 lb 12.8 oz (96.1 kg)  Height: 5\' 3"  (1.6 m)   Body mass index is 37.52 kg/m.  Nursing Note and Vital Signs reviewed.  Physical Exam  Constitutional: Patient appears well-developed and well-nourished. No distress.  HENT: Head: Normocephalic and atraumatic.  Eyes: Conjunctivae and EOM are normal. No scleral icterus.  Neck: Normal range of motion. Neck supple. No JVD present.  Cardiovascular: Normal rate, regular rhythm and normal heart sounds.  No murmur heard. No BLE edema. Pulmonary/Chest: Effort normal and breath sounds normal. No respiratory distress. Abdominal: Soft. Bowel sounds are normal, no distension. There is no tenderness. No masses. No  CVA tenderness. Musculoskeletal: Normal range of motion, no joint effusions. No gross deformities Neurological: Pt is alert and oriented to person, place, and time. No cranial nerve deficit. Coordination, balance, strength, speech and gait are normal.  Skin: Skin is warm and dry. No rash noted. No erythema.  Psychiatric: Patient has a normal mood and affect. behavior is normal. Judgment and thought content normal.  No results found for this or any previous visit (from the past 72 hour(s)).  Assessment & Plan  1. Generalized abdominal pain - Discussed my concern that she failed to follow up with her results - we discussed accessing MyChart and/or calling the office if she has not been able to access MyChart in the next 1-2 days.  We will obtain imaging, repeat labs, and refer to GI for further evaluation. - CT ABDOMEN PELVIS W WO CONTRAST; Future - CBC with Differential/Platelet; Future - Hepatitis panel, acute; Future - Comprehensive metabolic panel; Future - Urine Culture - Ambulatory referral to Gastroenterology  2. Abdominal cramping - CT ABDOMEN PELVIS W WO CONTRAST; Future - CBC  with Differential/Platelet; Future - Hepatitis panel, acute; Future - Comprehensive metabolic panel; Future - Ambulatory referral to Gastroenterology  3. Diarrhea, unspecified type - CT ABDOMEN PELVIS W WO CONTRAST; Future - CBC with Differential/Platelet; Future - Hepatitis panel, acute; Future - Comprehensive metabolic panel; Future - Ambulatory referral to Gastroenterology  4. Difficulty walking 5. Exertional shortness of breath 6. Fibromyalgia - Written Rx for walker is provided today.   -Red flags and when to present for emergency care or RTC including fever >101.51F, chest pain, shortness of breath, new/worsening/un-resolving symptoms, worsening abdominal pain, vomiting, worsening diarrhea, inability to eat/drink, taught or distended abdomen reviewed with patient at time of visit. Follow up and care instructions discussed and provided in AVS.  Face-to-face time with patient was more than 25 minutes, >50% time spent counseling and coordination of care

## 2019-09-22 ENCOUNTER — Telehealth: Payer: Self-pay | Admitting: Family Medicine

## 2019-09-22 ENCOUNTER — Other Ambulatory Visit: Payer: Self-pay

## 2019-09-22 ENCOUNTER — Ambulatory Visit: Payer: Self-pay

## 2019-09-22 DIAGNOSIS — M6281 Muscle weakness (generalized): Secondary | ICD-10-CM

## 2019-09-22 DIAGNOSIS — M542 Cervicalgia: Secondary | ICD-10-CM

## 2019-09-22 DIAGNOSIS — M7702 Medial epicondylitis, left elbow: Secondary | ICD-10-CM

## 2019-09-22 DIAGNOSIS — G8929 Other chronic pain: Secondary | ICD-10-CM

## 2019-09-22 LAB — URINE CULTURE
MICRO NUMBER:: 1223461
SPECIMEN QUALITY:: ADEQUATE
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
## 2019-09-22 NOTE — Telephone Encounter (Signed)
Patient called and was read lab note by [name] NP 09/22/2019.  She verbalized understanding. She has not heard form CT or GI referral. She was advised can take several days. Please advise patient.

## 2019-09-23 ENCOUNTER — Other Ambulatory Visit: Payer: Self-pay | Admitting: Family Medicine

## 2019-09-23 DIAGNOSIS — R1084 Generalized abdominal pain: Secondary | ICD-10-CM

## 2019-09-27 ENCOUNTER — Encounter: Payer: Self-pay | Admitting: *Deleted

## 2019-09-27 ENCOUNTER — Other Ambulatory Visit: Payer: Self-pay

## 2019-09-27 ENCOUNTER — Ambulatory Visit: Payer: Self-pay

## 2019-09-27 DIAGNOSIS — M542 Cervicalgia: Secondary | ICD-10-CM

## 2019-09-27 DIAGNOSIS — M7702 Medial epicondylitis, left elbow: Secondary | ICD-10-CM

## 2019-09-27 DIAGNOSIS — M6281 Muscle weakness (generalized): Secondary | ICD-10-CM
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
## 2019-09-28 ENCOUNTER — Encounter: Payer: Self-pay | Admitting: Pain Medicine

## 2019-09-28 ENCOUNTER — Ambulatory Visit
Admission: RE | Admit: 2019-09-28 | Discharge: 2019-09-28 | Disposition: A | Discharge status: Home / Self Care | Payer: Medicaid Other | Source: Ambulatory Visit | Attending: Pain Medicine | Admitting: Pain Medicine

## 2019-09-28 ENCOUNTER — Ambulatory Visit (HOSPITAL_BASED_OUTPATIENT_CLINIC_OR_DEPARTMENT_OTHER): Payer: Self-pay | Admitting: Pain Medicine

## 2019-09-28 ENCOUNTER — Other Ambulatory Visit: Payer: Self-pay

## 2019-09-28 VITALS — BP 100/65 | HR 67 | Temp 98.2°F | Resp 10 | Ht 63.0 in | Wt 210.0 lb

## 2019-09-28 DIAGNOSIS — M47816 Spondylosis without myelopathy or radiculopathy, lumbar region: Secondary | ICD-10-CM

## 2019-09-28 DIAGNOSIS — R55 Syncope and collapse: Secondary | ICD-10-CM | POA: Insufficient documentation

## 2019-09-28 DIAGNOSIS — M79604 Pain in right leg: Secondary | ICD-10-CM

## 2019-09-28 DIAGNOSIS — G8929 Other chronic pain: Secondary | ICD-10-CM

## 2019-09-28 DIAGNOSIS — M5442 Lumbago with sciatica, left side: Secondary | ICD-10-CM

## 2019-09-28 DIAGNOSIS — M5416 Radiculopathy, lumbar region: Secondary | ICD-10-CM

## 2019-09-28 DIAGNOSIS — M5137 Other intervertebral disc degeneration, lumbosacral region: Secondary | ICD-10-CM

## 2019-09-28 DIAGNOSIS — R262 Difficulty in walking, not elsewhere classified: Secondary | ICD-10-CM | POA: Insufficient documentation

## 2019-09-28 DIAGNOSIS — M5441 Lumbago with sciatica, right side: Secondary | ICD-10-CM | POA: Insufficient documentation

## 2019-09-28 DIAGNOSIS — M79605 Pain in left leg: Secondary | ICD-10-CM

## 2019-09-28 DIAGNOSIS — Z91040 Latex allergy status: Secondary | ICD-10-CM

## 2019-09-28 DIAGNOSIS — Z91041 Radiographic dye allergy status: Secondary | ICD-10-CM | POA: Insufficient documentation

## 2019-09-28 DIAGNOSIS — R29898 Other symptoms and signs involving the musculoskeletal system: Secondary | ICD-10-CM | POA: Insufficient documentation

## 2019-09-28 MED ORDER — LIDOCAINE HCL 2 % IJ SOLN
INTRAMUSCULAR | Status: AC
  Filled 2019-09-28: qty 20

## 2019-09-28 MED ORDER — GLYCOPYRROLATE 0.2 MG/ML IJ SOLN
0.2000 mg | Freq: Once | INTRAMUSCULAR | Status: AC
  Administered 2019-09-28: 0.2 mg via INTRAVENOUS

## 2019-09-28 MED ORDER — ROPIVACAINE HCL 2 MG/ML IJ SOLN
2.0000 mL | Freq: Once | INTRAMUSCULAR | Status: AC
  Administered 2019-09-28: 09:00:00 2 mL via EPIDURAL

## 2019-09-28 MED ORDER — MIDAZOLAM HCL 5 MG/5ML IJ SOLN
INTRAMUSCULAR | Status: AC
  Filled 2019-09-28: qty 5

## 2019-09-28 MED ORDER — LACTATED RINGERS IV SOLN
1000.0000 mL | Freq: Once | INTRAVENOUS | Status: AC
  Administered 2019-09-28: 09:00:00 1000 mL via INTRAVENOUS

## 2019-09-28 MED ORDER — TRIAMCINOLONE ACETONIDE 40 MG/ML IJ SUSP
40.0000 mg | Freq: Once | INTRAMUSCULAR | Status: AC
  Administered 2019-09-28: 09:00:00 40 mg

## 2019-09-28 MED ORDER — SODIUM CHLORIDE (PF) 0.9 % IJ SOLN
INTRAMUSCULAR | Status: AC
  Filled 2019-09-28: qty 10

## 2019-09-28 MED ORDER — MIDAZOLAM HCL 5 MG/5ML IJ SOLN
1.0000 mg | INTRAMUSCULAR | Status: DC | PRN
  Administered 2019-09-28: 09:00:00 2 mg via INTRAVENOUS

## 2019-09-28 MED ORDER — FENTANYL CITRATE (PF) 100 MCG/2ML IJ SOLN
INTRAMUSCULAR | Status: AC
  Filled 2019-09-28: qty 2

## 2019-09-28 MED ORDER — SODIUM CHLORIDE 0.9% FLUSH
2.0000 mL | Freq: Once | INTRAVENOUS | Status: AC
  Administered 2019-09-28: 09:00:00 2 mL

## 2019-09-28 MED ORDER — FENTANYL CITRATE (PF) 100 MCG/2ML IJ SOLN
25.0000 ug | INTRAMUSCULAR | Status: DC | PRN
  Administered 2019-09-28: 50 ug via INTRAVENOUS

## 2019-09-28 MED ORDER — TRIAMCINOLONE ACETONIDE 40 MG/ML IJ SUSP
INTRAMUSCULAR | Status: AC
  Filled 2019-09-28: qty 1

## 2019-09-28 MED ORDER — ROPIVACAINE HCL 2 MG/ML IJ SOLN
INTRAMUSCULAR | Status: AC
  Filled 2019-09-28: qty 10

## 2019-09-28 MED ORDER — GLYCOPYRROLATE 0.2 MG/ML IJ SOLN
INTRAMUSCULAR | Status: AC
  Filled 2019-09-28: qty 1

## 2019-09-28 NOTE — Patient Instructions (Signed)

## 2019-09-28 NOTE — Progress Notes (Signed)
Safety precautions to be maintained throughout the outpatient stay will include: orient to surroundings, keep bed in low position, maintain call bell within reach at all times, provide assistance with transfer out of bed and ambulation.  

## 2019-09-28 NOTE — Progress Notes (Signed)
PROVIDER NOTE: Information contained herein reflects review and annotations entered in association with encounter. Interpretation of such information and data should be left to medically-trained personnel. Information provided to patient can be located elsewhere in the medical record under "Patient Instructions". Document created using STT-dictation technology, any transcriptional errors that may result from process are unintentional.   Patient's Name: Dana Bradley  MRN: [id number]  Referring Provider: Hubbard Hartshorn, FNP  DOB: 08-31-1971  PCP: Hubbard Hartshorn, FNP  DOS: 09/28/2019  Note by: Gaspar Cola, MD  Service setting: Ambulatory outpatient  Specialty: Interventional Pain Management  Patient type: Established  Location: [facility] (AMB) Pain Management Facility  Visit type: Interventional Procedure   Primary Reason for Visit: Interventional Pain Management Treatment. CC: Pain (pelvic), Leg Pain (bilateral), and Back Pain (low)  Procedure:          Anesthesia, Analgesia, Anxiolysis:  Type: Diagnostic Epidural Steroid Injection #1  Region: Caudal Level: Sacrococcygeal   Laterality: Midline aiming at the left  Type: Moderate (Conscious) Sedation combined with Local Anesthesia Indication(s): Analgesia and Anxiety Route: Intravenous (IV) IV Access: Secured Sedation: Meaningful verbal contact was maintained at all times during the procedure  Local Anesthetic: Lidocaine 1-2%  Position: Prone   Indications: 1. Chronic lower extremity pain (Bilateral)   2. DDD (degenerative disc disease), lumbosacral   3. Lumbar spondylosis   4. Weakness of leg (Left)   5. Lumbar radiculitis (Left)   6. Chronic low back pain (Primary Area of Pain) (Bilateral) (R>L) (midline)   7. Difficulty walking    Pain Score: Pre-procedure: 5 /10 Post-procedure: 3 /10   Today the patient has indicated that her neck is beginning to act up as well and she is having difficulty turning her head towards the left side.   When she does, it brings on an occipital headache suggesting problems in the region of the occipital nerve/C2/TON.  The patient indicated that last night she thought about coming to the emergency room, but decided against it secondary to the issues with the COVID-19 pandemic.  Today I reminded her that we need to put at least 2 weeks between any steroid injections.  Pre-op Assessment:  Dana Bradley is a 48 y.o. (year old), female patient, seen today for interventional treatment. She  has a past surgical history that includes Ablation; Tubal ligation (10/01/99); Cardiac catheterization; Knee arthroscopy; Colonoscopy with propofol (N/A, 05/17/2015); Esophagogastroduodenoscopy (N/A, 05/17/2015); spg; and Tibial Tubercle Bypass (Right, 1998). Ms. Placke has a current medication list which includes the following prescription(s): acetaminophen, atorvastatin, atorvastatin, baclofen, vitamin b-12, dexilant, diclofenac sodium, dicyclomine, [START ON 10/07/2019] hydrocodone-acetaminophen, magnesium oxide, melatonin, metoprolol tartrate, ondansetron, pregabalin, valerian, vitamin d (ergocalciferol), and hydrocodone-acetaminophen, and the following Facility-Administered Medications: fentanyl and midazolam. Her primarily concern today is the Pain (pelvic), Leg Pain (bilateral), and Back Pain (low)  Initial Vital Signs:  Pulse/HCG Rate: 61ECG Heart Rate: 76 Temp: 98.2 F (36.8 C) Resp: 16 BP: 122/77 SpO2: 98 %  BMI: Estimated body mass index is 37.2 kg/m as calculated from the following:   Height as of this encounter: 5\' 3"  (1.6 m).   Weight as of this encounter: 210 lb (95.3 kg).  Risk Assessment: Allergies: Reviewed. She is allergic to aspirin; cymbalta [duloxetine hcl]; depakote [divalproex sodium]; gadolinium derivatives; haloperidol; meperidine; reglan [metoclopramide]; tramadol hcl; trazodone; compazine [prochlorperazine]; meloxicam; penicillins; tomato; other; shellfish allergy; shellfish-derived products;  bacitracin-neomycin-polymyxin; cephalosporins; ibuprofen; latex; neosporin [neomycin-bacitracin zn-polymyx]; nsaids; sulfa antibiotics; and sulfonamide derivatives.  Allergy Precautions: None required Coagulopathies: Reviewed. None identified.  Blood-thinner therapy: None at this time Active Infection(s): Reviewed. None identified. [name] is afebrile  Site Confirmation: Ms. Kanak was asked to confirm the procedure and laterality before marking the site Procedure checklist: Completed Consent: Before the procedure and under the influence of no sedative(s), amnesic(s), or anxiolytics, the patient was informed of the treatment options, risks and possible complications. To fulfill our ethical and legal obligations, as recommended by the American Medical Association's Code of Ethics, I have informed the patient of my clinical impression; the nature and purpose of the treatment or procedure; the risks, benefits, and possible complications of the intervention; the alternatives, including doing nothing; the risk(s) and benefit(s) of the alternative treatment(s) or procedure(s); and the risk(s) and benefit(s) of doing nothing. The patient was provided information about the general risks and possible complications associated with the procedure. These may include, but are not limited to: failure to achieve desired goals, infection, bleeding, organ or nerve damage, allergic reactions, paralysis, and death. In addition, the patient was informed of those risks and complications associated to Spine-related procedures, such as failure to decrease pain; infection (i.e.: Meningitis, epidural or intraspinal abscess); bleeding (i.e.: epidural hematoma, subarachnoid hemorrhage, or any other type of intraspinal or peri-dural bleeding); organ or nerve damage (i.e.: Any type of peripheral nerve, nerve root, or spinal cord injury) with subsequent damage to sensory, motor, and/or autonomic systems, resulting in permanent pain,  numbness, and/or weakness of one or several areas of the body; allergic reactions; (i.e.: anaphylactic reaction); and/or death. Furthermore, the patient was informed of those risks and complications associated with the medications. These include, but are not limited to: allergic reactions (i.e.: anaphylactic or anaphylactoid reaction(s)); adrenal axis suppression; blood sugar elevation that in diabetics may result in ketoacidosis or comma; water retention that in patients with history of congestive heart failure may result in shortness of breath, pulmonary edema, and decompensation with resultant heart failure; weight gain; swelling or edema; medication-induced neural toxicity; particulate matter embolism and blood vessel occlusion with resultant organ, and/or nervous system infarction; and/or aseptic necrosis of one or more joints. Finally, the patient was informed that Medicine is not an exact science; therefore, there is also the possibility of unforeseen or unpredictable risks and/or possible complications that may result in a catastrophic outcome. The patient indicated having understood very clearly. We have given the patient no guarantees and we have made no promises. Enough time was given to the patient to ask questions, all of which were answered to the patient's satisfaction. Ms. Pinson has indicated that she wanted to continue with the procedure. Attestation: I, the ordering provider, attest that I have discussed with the patient the benefits, risks, side-effects, alternatives, likelihood of achieving goals, and potential problems during recovery for the procedure that I have provided informed consent. Date  Time: 09/28/2019  8:46 AM  Pre-Procedure Preparation:  Monitoring: As per clinic protocol. Respiration, ETCO2, SpO2, BP, heart rate and rhythm monitor placed and checked for adequate function Safety Precautions: Patient was assessed for positional comfort and pressure points before starting the  procedure. Time-out: I initiated and conducted the "Time-out" before starting the procedure, as per protocol. The patient was asked to participate by confirming the accuracy of the "Time Out" information. Verification of the correct person, site, and procedure were performed and confirmed by me, the nursing staff, and the patient. "Time-out" conducted as per Joint Commission's Universal Protocol (UP.01.01.01). Time: 0922  Description of Procedure:          Target Area: Caudal  Epidural Canal. Approach: Midline approach. Area Prepped: Entire Posterior Sacrococcygeal Region Prepping solution: DuraPrep (Iodine Povacrylex [0.7% available iodine] and Isopropyl Alcohol, 74% w/w) Safety Precautions: Aspiration looking for blood return was conducted prior to all injections. At no point did we inject any substances, as a needle was being advanced. No attempts were made at seeking any paresthesias. Safe injection practices and needle disposal techniques used. Medications properly checked for expiration dates. SDV (single dose vial) medications used. Description of the Procedure: Protocol guidelines were followed. The patient was placed in position over the fluoroscopy table. The target area was identified and the area prepped in the usual manner. Skin & deeper tissues infiltrated with local anesthetic. Appropriate amount of time allowed to pass for local anesthetics to take effect. The procedure needles were then advanced to the target area. Proper needle placement secured. Negative aspiration confirmed. Solution injected in intermittent fashion, asking for systemic symptoms every 0.5cc of injectate. The needles were then removed and the area cleansed, making sure to leave some of the prepping solution back to take advantage of its long term bactericidal properties. Vitals:   09/28/19 0930 09/28/19 0939 09/28/19 0950 09/28/19 1000  BP: (!) 136/95 118/76 105/66 100/65  Pulse: 67     Resp: 16 13 10 10   Temp:       TempSrc:      SpO2: 97% 98% 99% 99%  Weight:      Height:        Start Time: 0922 hrs. End Time: 0929 hrs. Materials:  Needle(s) Type: Epidural needle Gauge: 17G Length: 3.5-in Medication(s): Please see orders for medications and dosing details.  Imaging Guidance (Spinal):          Type of Imaging Technique: Fluoroscopy Guidance (Spinal) Indication(s): Assistance in needle guidance and placement for procedures requiring needle placement in or near specific anatomical locations not easily accessible without such assistance. Exposure Time: Please see nurses notes. Contrast: None used. Fluoroscopic Guidance: I was personally present during the use of fluoroscopy. "Tunnel Vision Technique" used to obtain the best possible view of the target area. Parallax error corrected before commencing the procedure. "Direction-depth-direction" technique used to introduce the needle under continuous pulsed fluoroscopy. Once target was reached, antero-posterior, oblique, and lateral fluoroscopic projection used confirm needle placement in all planes. Images permanently stored in EMR. Interpretation: No contrast injected. I personally interpreted the imaging intraoperatively. Adequate needle placement confirmed in multiple planes. Permanent images saved into the patient's record.  Antibiotic Prophylaxis:   Anti-infectives (From admission, onward)   None     Indication(s): None identified  Post-operative Assessment:  Post-procedure Vital Signs:  Pulse/HCG Rate: 67(!) 59 Temp: 98.2 F (36.8 C) Resp: 10 BP: 100/65 SpO2: 99 %  EBL: None  Complications: No immediate post-treatment complications observed by team, or reported by patient.  Note: The patient tolerated the entire procedure well. A repeat set of vitals were taken after the procedure and the patient was kept under observation following institutional policy, for this type of procedure. Post-procedural neurological assessment was  performed, showing return to baseline, prior to discharge. The patient was provided with post-procedure discharge instructions, including a section on how to identify potential problems. Should any problems arise concerning this procedure, the patient was given instructions to immediately contact us, at any time, without hesitation. In any case, we plan to contact the patient by telephone for a follow-up status report regarding this interventional procedure.  Comments:  No additional relevant information.  Plan of Care  Orders:  Orders Placed This Encounter  Procedures  . Caudal ESI (Today)    Scheduling Instructions:     Laterality: Midline     Level(s): Sacrococcygeal canal (Tailbone area)     Sedation: Patient's choice     Timeframe: Today    Order Specific Question:   Where will this procedure be performed?    Answer:   [facility]  . Fluoro (C-Arm) (<60 min) (No Report)    Intraoperative interpretation by procedural physician at [facility].    Standing Status:   Standing    Number of Occurrences:   1    Order Specific Question:   Reason for exam:    Answer:   Assistance in needle guidance and placement for procedures requiring needle placement in or near specific anatomical locations not easily accessible without such assistance.  . Consent: Caudal ESI    Nursing Order: Transcribe to consent form and obtain patient signature. Note: Always confirm laterality of pain with Ms. Balladares, before procedure. Procedure: Caudal epidural steroid injection Indication/Reason: Low back pain and lower extremity pain secondary to lumbosacral radiculitis Provider Attestation: I, Norton Dossie Arbour, MD, (Pain Management Specialist), the physician/practitioner, attest that I have discussed with the patient the benefits, risks, side effects, alternatives, likelihood of achieving goals and potential problems during recovery for the procedure that I have provided informed consent.  Dwain Sarna Tray    Equipment required: Single use, disposable, "Block Tray"    Standing Status:   Standing    Number of Occurrences:   1    Order Specific Question:   Specify    Answer:   Block Tray  . Allergy: CONTRAST    Standing Status:   Standing    Number of Occurrences:   1  . Allergy: LATEX    Activate Latex-Free Protocol.    Standing Status:   Standing    Number of Occurrences:   1   Chronic Opioid Analgesic:  Hydrocodone/APAP 7.5/325, 1 tab PO QD (7.5 mg/day of hydrocodone)  MME/day:7.5mg /day.   Medications ordered for procedure: Meds ordered this encounter  Medications  . lactated ringers infusion 1,000 mL  . midazolam (VERSED) 5 MG/5ML injection 1-2 mg    Make sure Flumazenil is available in the pyxis when using this medication. If oversedation occurs, administer 0.2 mg IV over 15 sec. If after 45 sec no response, administer 0.2 mg again over 1 min; may repeat at 1 min intervals; not to exceed 4 doses (1 mg)  . fentaNYL (SUBLIMAZE) injection 25-50 mcg    Make sure Narcan is available in the pyxis when using this medication. In the event of respiratory depression (RR< 8/min): Titrate NARCAN (naloxone) in increments of 0.1 to 0.2 mg IV at 2-3 minute intervals, until desired degree of reversal.  . glycopyrrolate (ROBINUL) injection 0.2 mg  . sodium chloride flush (NS) 0.9 % injection 2 mL  . ropivacaine (PF) 2 mg/mL (0.2%) (NAROPIN) injection 2 mL  . triamcinolone acetonide (KENALOG-40) injection 40 mg   Medications administered: We administered lactated ringers, midazolam, fentaNYL, glycopyrrolate, sodium chloride flush, ropivacaine (PF) 2 mg/mL (0.2%), and triamcinolone acetonide.  See the medical record for exact dosing, route, and time of administration.  Follow-up plan:   Return in about 2 weeks (around 10/12/2019) for (VV), (PP).       Interventional treatment options:  Under consideration: Possible bilateral lumbar facet RFA. Diagnostic thoracic facet  block Possible bilateral thoracic facet RFA. Diagnostic bilateral cervical facet blocks Possible bilateral cervical  facet RFA. Diagnostic bilateral lesser occipital NB Possible right occipital nerve RFA. Diagnostic right [id number] Diagnostic bilateral suprascapular NB Diagnostic bilateral IA knee joint injection Possible bilateral IA Hyalgan knee injections.  Diagnostic bilateral Genicular NB Possible bilateral Genicular nerve RFA.   Therapeutic/palliative (PRN): Diagnostic left SI joint block #2  Diagnostic/therapeutic left IA hip joint injection #2  Diagnostic bilateral lumbar facet block #2  Palliative left trapezius muscle MNB #4 Palliative right trapezius muscle MNB #2 Palliative left L1-2 LESI #2 Palliative right L1-2 LESI #2 Palliativeleft CESI #3 Palliative left greater occipital NB #3  Palliative left C2 + TON NB #2  Palliative left GON + C2 + TON RFA #2 (last done 12/25/2017)    Recent Visits Date Type Provider Dept  09/21/19 Telemedicine Milinda Pointer, [facility]  09/07/19 Procedure visit Milinda Pointer, [facility]  09/06/19 Telemedicine Milinda Pointer, MD [facility]  08/03/19 Procedure visit Milinda Pointer, MD [facility]  07/21/19 Office Visit Milinda Pointer, MD [facility]  Showing recent visits within past 90 days and meeting all other requirements   Today's Visits Date Type Provider Dept  09/28/19 Procedure visit Milinda Pointer, MD [facility]  Showing today's visits and meeting all other requirements   Future Appointments Date Type Provider Dept  10/13/19 Appointment Milinda Pointer, [facility]  11/03/19 Appointment Milinda Pointer, MD [facility]  Showing future appointments within next 90 days and meeting all other requirements   Disposition: Discharge home  Discharge Date & Time: 09/28/2019; 1005 hrs.    Primary Care Physician: Hubbard Hartshorn, FNP Location: [facility] Note by: Gaspar Cola, MD Date: 09/28/2019; Time: 10:34 AM  Disclaimer:  Medicine is not an exact science. The only guarantee in medicine is that nothing is guaranteed. It is important to note that the decision to proceed with this intervention was based on the information collected from the patient. The Data and conclusions were drawn from the patient's questionnaire, the interview, and the physical examination. Because the information was provided in large part by the patient, it cannot be guaranteed that it has not been purposely or unconsciously manipulated. Every effort has been made to obtain as much relevant data as possible for this evaluation. It is important to note that the conclusions that lead to this procedure are derived in large part from the available data. Always take into account that the treatment will also be dependent on availability of resources and existing treatment guidelines, considered by other Pain Management Practitioners as being common knowledge and practice, at the time of the intervention. For Medico-Legal purposes, it is also important to point out that variation in procedural techniques and pharmacological choices are the acceptable norm. The indications, contraindications, technique, and results of the above procedure should only be interpreted and judged by a Board-Certified Interventional Pain Specialist with extensive familiarity and expertise in the same exact procedure and technique.

## 2019-09-29 ENCOUNTER — Telehealth: Payer: Self-pay

## 2019-09-29 ENCOUNTER — Ambulatory Visit: Payer: Self-pay

## 2019-09-29 ENCOUNTER — Encounter: Payer: Self-pay | Admitting: Family Medicine

## 2019-09-29 DIAGNOSIS — G8929 Other chronic pain: Secondary | ICD-10-CM

## 2019-09-29 DIAGNOSIS — M7702 Medial epicondylitis, left elbow: Secondary | ICD-10-CM

## 2019-09-29 DIAGNOSIS — M6281 Muscle weakness (generalized): Secondary | ICD-10-CM

## 2019-09-29 DIAGNOSIS — M542 Cervicalgia: Secondary | ICD-10-CM

## 2019-09-29 DIAGNOSIS — M5442 Lumbago with sciatica, left side: Secondary | ICD-10-CM
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
## 2019-09-29 NOTE — Telephone Encounter (Signed)
Patient states that she is having pain from her ribs to her toes.  States that she did not get relief in the first 4-6 hours after procedure.  Denies fever, or progressive weakness.  States she did have some incontinence prior to the procedure but has a little more now.  States having pain at her pelvis area and is awaiting a CT scan for that.  States most of the pain is the same pain that she was having prior to the procedure.  Instructed pateint to give it a few days to see if the steroid starts working.  Instructed to call operator or go to ED for any concerns or increase in symptoms.  Patient states understanding.

## 2019-09-29 NOTE — Telephone Encounter (Signed)
Post procedure phone call.  LM 

## 2019-09-29 NOTE — Telephone Encounter (Signed)
Pt returned phone call and stated that she is in pain from her rib cage to her feet and she "feels like crap".

## 2019-10-01 DIAGNOSIS — K529 Noninfective gastroenteritis and colitis, unspecified: Secondary | ICD-10-CM

## 2019-10-01 HISTORY — DX: Noninfective gastroenteritis and colitis, unspecified: K52.9

## 2019-10-04 ENCOUNTER — Other Ambulatory Visit: Payer: Self-pay

## 2019-10-04 ENCOUNTER — Ambulatory Visit: Payer: Self-pay | Attending: Pain Medicine

## 2019-10-04 DIAGNOSIS — M6281 Muscle weakness (generalized): Secondary | ICD-10-CM | POA: Insufficient documentation

## 2019-10-04 DIAGNOSIS — M542 Cervicalgia: Secondary | ICD-10-CM | POA: Insufficient documentation

## 2019-10-04 DIAGNOSIS — M5440 Lumbago with sciatica, unspecified side: Secondary | ICD-10-CM | POA: Insufficient documentation

## 2019-10-04 DIAGNOSIS — G8929 Other chronic pain: Secondary | ICD-10-CM | POA: Insufficient documentation

## 2019-10-04 DIAGNOSIS — M7702 Medial epicondylitis, left elbow: Secondary | ICD-10-CM | POA: Insufficient documentation
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
## 2019-10-06 ENCOUNTER — Other Ambulatory Visit: Payer: Self-pay

## 2019-10-06 ENCOUNTER — Ambulatory Visit: Payer: Self-pay

## 2019-10-06 DIAGNOSIS — M542 Cervicalgia: Secondary | ICD-10-CM

## 2019-10-06 DIAGNOSIS — M5440 Lumbago with sciatica, unspecified side: Secondary | ICD-10-CM

## 2019-10-06 DIAGNOSIS — M6281 Muscle weakness (generalized): Secondary | ICD-10-CM

## 2019-10-06 DIAGNOSIS — M7702 Medial epicondylitis, left elbow: Secondary | ICD-10-CM

## 2019-10-06 DIAGNOSIS — G8929 Other chronic pain: Secondary | ICD-10-CM
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
## 2019-10-12 ENCOUNTER — Ambulatory Visit: Payer: Self-pay

## 2019-10-12 ENCOUNTER — Encounter: Payer: Self-pay | Admitting: Family Medicine

## 2019-10-12 ENCOUNTER — Telehealth: Payer: Self-pay

## 2019-10-12 ENCOUNTER — Other Ambulatory Visit: Payer: Self-pay

## 2019-10-12 DIAGNOSIS — M542 Cervicalgia: Secondary | ICD-10-CM

## 2019-10-12 DIAGNOSIS — M6281 Muscle weakness (generalized): Secondary | ICD-10-CM

## 2019-10-12 NOTE — Telephone Encounter (Signed)
Left message to call office

## 2019-10-12 NOTE — Progress Notes (Signed)
Patient: Dana Bradley  Service Category: E/M  Provider: Gaspar Cola, MD  DOB: January 28, 1971  DOS: 10/13/2019  Location: Office  MRN: [id number]  Setting: Ambulatory outpatient  Referring Provider: Hubbard Hartshorn, FNP  Type: Established Patient  Specialty: Interventional Pain Management  PCP: Hubbard Hartshorn, FNP  Location: Remote location  Delivery: TeleHealth     Virtual Encounter - Pain Management PROVIDER NOTE: Information contained herein reflects review and annotations entered in association with encounter. Interpretation of such information and data should be left to medically-trained personnel. Information provided to patient can be located elsewhere in the medical record under "Patient Instructions". Document created using STT-dictation technology, any transcriptional errors that may result from process are unintentional.    Contact & Pharmacy Preferred: [phone] Home: [phone] (home) Mobile: [phone] (mobile) E-mail: [email or web address]  [place], [address] [place] [address] Phone: [phone] Fax: [phone]  [facility] [place], [place] [address] [place] [address] Phone: [phone] Fax: [phone]  [place], [place] [address] [place] [address] Phone: [phone] Fax: [phone]   Pre-screening  [name] offered "in-person" vs "virtual" encounter. She indicated preferring virtual for this encounter.   Reason COVID-19*  Social distancing based on CDC and AMA recommendations.   I contacted [name] on 10/13/2019 via telephone.      I clearly identified myself as Gaspar Cola, MD. I verified that I was speaking with the correct person using two identifiers (Name: [name], and date of birth: 1970/10/10).  Consent I sought verbal  advanced consent from [name] for virtual visit interactions. I informed Ms. Tetro of possible security and privacy concerns, risks, and limitations associated with providing "not-in-person" medical evaluation and management services. I also informed Ms. Jonaitis of the availability of "in-person" appointments. Finally, I informed her that there would be a charge for the virtual visit and that she could be  personally, fully or partially, financially responsible for it. Ms. Wollman expressed understanding and agreed to proceed.   Historic Elements   [name] is a 49 y.o. year old, female patient evaluated today after her last encounter by our practice on 10/12/2019. [name]  has a past medical history of Acute postoperative pain (04/07/2017), Anxiety, Bursitis, Chronic fatigue (12/12/2017), Chronic fatigue syndrome, Edema leg (05/02/2015), Fibromyalgia, GERD (gastroesophageal reflux disease), IBS (irritable bowel syndrome), Knee pain, bilateral (12/21/2008), Lumbar discitis, Migraines, Osteoarthritis, Right hand pain (04/10/2015), Sleep apnea, Spinal stenosis, SVT (supraventricular tachycardia) ([place]), Vertigo, and Vitamin D deficiency (05/01/2016). She also  has a past surgical history that includes Ablation; Tubal ligation (10/01/99); Cardiac catheterization; Knee arthroscopy; Colonoscopy with propofol (N/A, 05/17/2015); Esophagogastroduodenoscopy (N/A, 05/17/2015); spg; and Tibial Tubercle Bypass (Right, 1998). [name] has a current medication list which includes the following prescription(s): acetaminophen, atorvastatin, atorvastatin, baclofen, vitamin b-12, dexilant, diclofenac sodium, dicyclomine, [START ON 11/06/2019] hydrocodone-acetaminophen, [START ON 12/06/2019] hydrocodone-acetaminophen, [START ON 01/05/2020] hydrocodone-acetaminophen, magnesium oxide, melatonin, metoprolol tartrate, ondansetron, pregabalin, valerian, and vitamin d (ergocalciferol). She  reports that she quit smoking about 26 years ago. Her  smoking use included cigarettes. She has a 12.00 pack-year smoking history. She has never used smokeless tobacco. She reports that she does not drink alcohol or use drugs. [name] is allergic to aspirin; cymbalta [duloxetine hcl]; depakote [divalproex sodium]; gadolinium derivatives; haloperidol; meperidine; reglan [metoclopramide]; tramadol hcl; trazodone;  compazine [prochlorperazine]; meloxicam; penicillins; tomato; other; shellfish allergy; shellfish-derived products; bacitracin-neomycin-polymyxin; cephalosporins; ibuprofen; latex; neosporin [neomycin-bacitracin zn-polymyx]; nsaids; sulfa antibiotics; and sulfonamide derivatives.   HPI  Today, she is being contacted for a post-procedure assessment.  No significant improvement.  Due to the patient's insurance, she apparently was unable to see Dr. Lysle Morales for her neurosurgical evaluation.  Apparently she needs to be able to see somebody within the [facility] system.  Post-Procedure Evaluation  Procedure: Diagnostic (midline to left) caudal epidural steroid injection #1 under fluoroscopic guidance and IV sedation Pre-procedure pain level:  5/10 Post-procedure: 3/10 (< 50% relief)  Sedation: Sedation provided.  Dewayne Shorter, RN  10/13/2019  8:32 AM  Signed Pain relief after procedure (treated area only): (Questions asked to patient) 1. Starting about 15 minutes after the procedure, and "while the area was still numb" (from the local anesthetics), were you having any of your usual pain "in that area" (the treated area)?  (NOTE: NOT including the discomfort from the needle sticks.) First 1 hour: 0 % better. First 4-6 hours: 0 % better. 2. How long did the numbness from the local anesthetics last? (More than 6 hours?) Duration:0 hours.  3. How much better is your pain now, when compared to before the procedure? Current benefit: 30 % better. 4. Can you move better now? Improvement in ROM (Range of Motion): No. 5. Can you do more now? Improvement  in function: No. 4. Did you have any problems with the procedure? Side-effects/Complications: No.  Current benefits: Defined as benefit that persist at this time.   Analgesia:  Somewhat improved.  She indicates that she is no longer having shooting pains down the leg, but she still has the numbness.  I have reviewed the MRI and I am having difficulty explaining her symptoms based on the pathology that I say.  At this point, I believe that it would be good to have a new set of eyes examined her case and see if there is anything that they can contribute to it.  At this point, I also do not think that we need to do any further injections. Function: Back to baseline ROM: Back to baseline  Pharmacotherapy Assessment  Analgesic: Hydrocodone/APAP 7.5/325, 1 tab PO QD (7.5 mg/day of hydrocodone)  MME/day:7.5mg /day.   Monitoring: Pharmacotherapy: No side-effects or adverse reactions reported. [place] PMP: PDMP reviewed during this encounter.       Compliance: No problems identified. Effectiveness: Clinically acceptable. Plan: Refer to "POC".  UDS:  Summary  Date Value Ref Range Status  03/05/2018 FINAL  Final    Comment:    ==================================================================== TOXASSURE SELECT 13 (MW) ==================================================================== Test                             Result       Flag       Units Drug Present   Hydrocodone                    422                     ng/mg creat   Hydromorphone                  105                     ng/mg creat   Norhydrocodone  312                     ng/mg creat    Sources of hydrocodone include scheduled prescription    medications. Hydromorphone and norhydrocodone are expected    metabolites of hydrocodone. Hydromorphone is also available as a    scheduled prescription medication. ==================================================================== Test                      Result    Flag    Units      Ref Range   Creatinine              148              mg/dL      >=20 ==================================================================== Declared Medications:  Medication list was not provided. ==================================================================== For clinical consultation, please call [phone]. ====================================================================    Laboratory Chemistry Profile (12 mo)  Renal: 09/21/2019: BUN 15; Creatinine, Ser 0.62  Lab Results  Component Value Date   GFRAA >60 09/21/2019   GFRNONAA >60 09/21/2019   Hepatic: 09/21/2019: Albumin 4.0 Lab Results  Component Value Date   AST 21 09/21/2019   ALT 41 09/21/2019   Other: 08/01/2019: Vit D, 25-Hydroxy 12.48 Note: Above Lab results reviewed.  Imaging  Fluoro (C-Arm) (<60 min) (No Report) Fluoro was used, but no Radiologist interpretation will be provided.  Please refer to "NOTES" tab for provider progress note.   Assessment  The primary encounter diagnosis was Chronic lower extremity pain (Bilateral). Diagnoses of Chronic low back pain (Primary Area of Pain) (Bilateral) (R>L) (midline), Lumbar radiculitis (Left), Weakness of leg (Left), Cervical spondylosis with myelopathy and radiculopathy, Chronic migraine without aura, with intractable migraine, so stated, with status migrainosus, Difficulty walking, Chronic pain syndrome, and Fibromyalgia were also pertinent to this visit.  Plan of Care  Problem-specific:  No problem-specific Assessment & Plan notes found for this encounter.  I am having [name] start on HYDROcodone-acetaminophen and HYDROcodone-acetaminophen. I am also having her maintain her Vitamin B-12, magnesium oxide, ondansetron, atorvastatin, metoprolol tartrate, acetaminophen, Melatonin, VALERIAN PO, dicyclomine, Dexilant, baclofen, diclofenac sodium, Vitamin D (Ergocalciferol), atorvastatin, pregabalin, and HYDROcodone-acetaminophen.  Pharmacotherapy  (Medications Ordered): Meds ordered this encounter  Medications  . HYDROcodone-acetaminophen (NORCO) 7.5-325 MG tablet    Sig: Take 1 tablet by mouth every 6 (six) hours as needed for severe pain. Must last 90 days    Dispense:  30 tablet    Refill:  0    Chronic Pain: STOP Act (Not applicable) Fill 1 day early if closed on refill date. Do not fill until: 11/06/2019. To last until: 12/06/2019. Avoid benzodiazepines within 8 hours of opioids  . HYDROcodone-acetaminophen (NORCO) 7.5-325 MG tablet    Sig: Take 1 tablet by mouth every 6 (six) hours as needed for severe pain. Must last 90 days    Dispense:  30 tablet    Refill:  0    Chronic Pain: STOP Act (Not applicable) Fill 1 day early if closed on refill date. Do not fill until: 12/06/2019. To last until: 01/05/2020. Avoid benzodiazepines within 8 hours of opioids  . HYDROcodone-acetaminophen (NORCO) 7.5-325 MG tablet    Sig: Take 1 tablet by mouth every 6 (six) hours as needed for severe pain. Must last 90 days    Dispense:  30 tablet    Refill:  0    Chronic Pain: STOP Act (Not applicable) Fill 1 day early if closed on refill date. Do not fill  until: 01/05/2020. To last until: 02/04/2020. Avoid benzodiazepines within 8 hours of opioids   Orders:  Orders Placed This Encounter  Procedures  . Neurosurgery    Referral Priority:   Routine    Referral Type:   Surgical    Referral Reason:   Specialty Services Required    Requested Specialty:   Neurosurgery    Number of Visits Requested:   1  . Ambulatory referral to Neurology    Referral Priority:   Routine    Referral Type:   Consultation    Referral Reason:   Specialty Services Required    Requested Specialty:   Neurology    Number of Visits Requested:   1   Follow-up plan:   Return in about 4 months (around 01/31/2020) for (VV), (MM).      Interventional treatment options:  Under consideration: No further injections since the patient does not seem to be responding well to this type of  therapy.   Therapeutic/palliative (PRN): Diagnostic left SI joint block #2  Diagnostic/therapeutic left IA hip joint injection #2  Diagnostic bilateral lumbar facet block #2  Palliative left trapezius muscle MNB #4 Palliative right trapezius muscle MNB #2 Palliative left L1-2 LESI #2 Palliative right L1-2 LESI #2 Palliativeleft CESI #3 Palliative left greater occipital NB #3  Palliative left C2 + TON NB #2  Palliative left GON + C2 + TON RFA #2 (last done 12/25/2017)     Recent Visits Date Type Provider Dept  09/28/19 Procedure visit Milinda Pointer, MD [facility]  09/21/19 Telemedicine Milinda Pointer, [facility]  09/07/19 Procedure visit Milinda Pointer, [facility]  09/06/19 Telemedicine Milinda Pointer, MD [facility]  08/03/19 Procedure visit Milinda Pointer, MD [facility]  07/21/19 Office Visit Milinda Pointer, MD [facility]  Showing recent visits within past 90 days and meeting all other requirements   Today's Visits Date Type Provider Dept  10/13/19 Telemedicine Milinda Pointer, MD [facility]  Showing today's visits and meeting all other requirements   Future Appointments Date Type Provider Dept  11/03/19 Appointment Milinda Pointer, MD [facility]  Showing future appointments within next 90 days and meeting all other requirements   I discussed the assessment and treatment plan with the patient. The patient was provided an opportunity to ask questions and all were answered. The patient agreed with the plan and demonstrated an understanding of the instructions.  Patient advised to call back or seek an in-person evaluation if the symptoms or condition worsens.  Total duration of non-face-to-face encounter: 15 minutes.  Note by: Gaspar Cola, MD Date: 10/13/2019; Time: 10:12 AM

## 2019-10-13 ENCOUNTER — Encounter: Payer: Self-pay | Admitting: Pain Medicine

## 2019-10-13 ENCOUNTER — Ambulatory Visit: Payer: Medicaid Other | Attending: Pain Medicine | Admitting: Pain Medicine

## 2019-10-13 DIAGNOSIS — G894 Chronic pain syndrome: Secondary | ICD-10-CM

## 2019-10-13 DIAGNOSIS — M79604 Pain in right leg: Secondary | ICD-10-CM

## 2019-10-13 DIAGNOSIS — R262 Difficulty in walking, not elsewhere classified: Secondary | ICD-10-CM

## 2019-10-13 DIAGNOSIS — R29898 Other symptoms and signs involving the musculoskeletal system: Secondary | ICD-10-CM

## 2019-10-13 DIAGNOSIS — M5416 Radiculopathy, lumbar region: Secondary | ICD-10-CM

## 2019-10-13 DIAGNOSIS — G43711 Chronic migraine without aura, intractable, with status migrainosus: Secondary | ICD-10-CM

## 2019-10-13 DIAGNOSIS — M4712 Other spondylosis with myelopathy, cervical region: Secondary | ICD-10-CM

## 2019-10-13 DIAGNOSIS — M5441 Lumbago with sciatica, right side: Secondary | ICD-10-CM

## 2019-10-13 DIAGNOSIS — M5442 Lumbago with sciatica, left side: Secondary | ICD-10-CM

## 2019-10-13 DIAGNOSIS — M797 Fibromyalgia: Secondary | ICD-10-CM

## 2019-10-13 DIAGNOSIS — M4722 Other spondylosis with radiculopathy, cervical region: Secondary | ICD-10-CM

## 2019-10-13 DIAGNOSIS — G8929 Other chronic pain: Secondary | ICD-10-CM

## 2019-10-13 DIAGNOSIS — M79605 Pain in left leg: Secondary | ICD-10-CM

## 2019-10-13 MED ORDER — HYDROCODONE-ACETAMINOPHEN 7.5-325 MG PO TABS: 1.0000 | ORAL_TABLET | Freq: Four times a day (QID) | ORAL | 0 refills | Status: DC | PRN
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
## 2019-10-13 NOTE — Progress Notes (Signed)
Pain relief after procedure (treated area only): (Questions asked to patient) 1. Starting about 15 minutes after the procedure, and "while the area was still numb" (from the local anesthetics), were you having any of your usual pain "in that area" (the treated area)?  (NOTE: NOT including the discomfort from the needle sticks.) First 1 hour: 0 % better. First 4-6 hours: 0 % better. 2. How long did the numbness from the local anesthetics last? (More than 6 hours?) Duration:0 hours.  3. How much better is your pain now, when compared to before the procedure? Current benefit: 30 % better. 4. Can you move better now? Improvement in ROM (Range of Motion): No. 5. Can you do more now? Improvement in function: No. 4. Did you have any problems with the procedure? Side-effects/Complications: No.

## 2019-10-14 ENCOUNTER — Ambulatory Visit: Payer: Self-pay

## 2019-10-14 ENCOUNTER — Other Ambulatory Visit: Payer: Self-pay

## 2019-10-14 DIAGNOSIS — G8929 Other chronic pain: Secondary | ICD-10-CM

## 2019-10-14 DIAGNOSIS — M7702 Medial epicondylitis, left elbow: Secondary | ICD-10-CM

## 2019-10-14 DIAGNOSIS — M5440 Lumbago with sciatica, unspecified side: Secondary | ICD-10-CM

## 2019-10-14 DIAGNOSIS — M542 Cervicalgia: Secondary | ICD-10-CM

## 2019-10-14 DIAGNOSIS — M6281 Muscle weakness (generalized): Secondary | ICD-10-CM
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
## 2019-10-18 ENCOUNTER — Other Ambulatory Visit: Payer: Self-pay

## 2019-10-18 ENCOUNTER — Ambulatory Visit
Admission: RE | Admit: 2019-10-18 | Discharge: 2019-10-18 | Disposition: A | Discharge status: Home / Self Care | Payer: Medicaid Other | Source: Ambulatory Visit | Attending: Family Medicine | Admitting: Family Medicine

## 2019-10-18 DIAGNOSIS — R1084 Generalized abdominal pain: Secondary | ICD-10-CM | POA: Insufficient documentation

## 2019-10-18 DIAGNOSIS — R197 Diarrhea, unspecified: Secondary | ICD-10-CM | POA: Insufficient documentation

## 2019-10-18 DIAGNOSIS — R109 Unspecified abdominal pain: Secondary | ICD-10-CM

## 2019-10-18 MED ORDER — IOHEXOL 300 MG/ML  SOLN
100.0000 mL | Freq: Once | INTRAMUSCULAR | Status: AC | PRN
  Administered 2019-10-18: 100 mL via INTRAVENOUS

## 2019-10-19 ENCOUNTER — Ambulatory Visit: Payer: Self-pay

## 2019-10-19 ENCOUNTER — Other Ambulatory Visit: Payer: Self-pay | Admitting: Family Medicine

## 2019-10-19 DIAGNOSIS — R109 Unspecified abdominal pain: Secondary | ICD-10-CM

## 2019-10-19 DIAGNOSIS — R197 Diarrhea, unspecified: Secondary | ICD-10-CM

## 2019-10-19 DIAGNOSIS — M6281 Muscle weakness (generalized): Secondary | ICD-10-CM

## 2019-10-19 DIAGNOSIS — R1084 Generalized abdominal pain: Secondary | ICD-10-CM

## 2019-10-19 DIAGNOSIS — M542 Cervicalgia: Secondary | ICD-10-CM

## 2019-10-19 DIAGNOSIS — G8929 Other chronic pain: Secondary | ICD-10-CM

## 2019-10-19 DIAGNOSIS — M7702 Medial epicondylitis, left elbow: Secondary | ICD-10-CM

## 2019-10-20 ENCOUNTER — Telehealth: Payer: Self-pay
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
## 2019-10-20 NOTE — Telephone Encounter (Signed)
Patient called to report that she would like to check on the status of her MRI results and would be okay with getting a message through mychart once results are in.

## 2019-10-21 ENCOUNTER — Other Ambulatory Visit: Payer: Self-pay

## 2019-10-21 ENCOUNTER — Encounter: Payer: Self-pay | Admitting: *Deleted

## 2019-10-21 ENCOUNTER — Ambulatory Visit: Payer: Self-pay

## 2019-10-21 DIAGNOSIS — M5440 Lumbago with sciatica, unspecified side: Secondary | ICD-10-CM

## 2019-10-21 DIAGNOSIS — M6281 Muscle weakness (generalized): Secondary | ICD-10-CM

## 2019-10-21 DIAGNOSIS — M7702 Medial epicondylitis, left elbow: Secondary | ICD-10-CM

## 2019-10-21 DIAGNOSIS — M542 Cervicalgia: Secondary | ICD-10-CM

## 2019-10-21 DIAGNOSIS — G8929 Other chronic pain: Secondary | ICD-10-CM
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
## 2019-10-21 NOTE — Telephone Encounter (Signed)
Called pt & LVM asking for call back. When she calls back, please let her know we haven't received the imaging report yet. Ask her what day she had her MRI. It could take a few days for it to get sent to Dr. Jaynee Eagles. Thanks.

## 2019-10-21 NOTE — Telephone Encounter (Signed)
No report yet.

## 2019-10-21 NOTE — Telephone Encounter (Signed)
I sent the pt a mychart message as well.

## 2019-10-25 ENCOUNTER — Other Ambulatory Visit: Payer: Self-pay | Admitting: Family Medicine

## 2019-10-25 DIAGNOSIS — I10 Essential (primary) hypertension: Secondary | ICD-10-CM

## 2019-10-26 ENCOUNTER — Ambulatory Visit: Payer: Self-pay

## 2019-10-26 ENCOUNTER — Encounter: Payer: Self-pay | Admitting: Physical Medicine and Rehabilitation

## 2019-10-26 ENCOUNTER — Encounter: Payer: Self-pay | Admitting: Family Medicine

## 2019-10-26 ENCOUNTER — Encounter: Payer: Self-pay | Admitting: Pain Medicine

## 2019-10-26 ENCOUNTER — Encounter: Payer: Self-pay | Admitting: *Deleted

## 2019-10-26 ENCOUNTER — Other Ambulatory Visit: Payer: Self-pay

## 2019-10-26 DIAGNOSIS — M6281 Muscle weakness (generalized): Secondary | ICD-10-CM

## 2019-10-26 DIAGNOSIS — M542 Cervicalgia: Secondary | ICD-10-CM
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
## 2019-10-26 NOTE — Telephone Encounter (Signed)
Message from patient

## 2019-10-28 ENCOUNTER — Ambulatory Visit: Payer: Self-pay

## 2019-10-28 ENCOUNTER — Other Ambulatory Visit: Payer: Self-pay

## 2019-10-28 DIAGNOSIS — G8929 Other chronic pain: Secondary | ICD-10-CM

## 2019-10-28 DIAGNOSIS — M5440 Lumbago with sciatica, unspecified side: Secondary | ICD-10-CM

## 2019-10-28 DIAGNOSIS — M7702 Medial epicondylitis, left elbow: Secondary | ICD-10-CM

## 2019-10-28 DIAGNOSIS — M6281 Muscle weakness (generalized): Secondary | ICD-10-CM

## 2019-10-28 DIAGNOSIS — M542 Cervicalgia: Secondary | ICD-10-CM
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
## 2019-11-01 ENCOUNTER — Ambulatory Visit: Payer: Medicaid Other | Admitting: Gastroenterology

## 2019-11-03 ENCOUNTER — Telehealth: Payer: Medicaid Other | Admitting: Pain Medicine

## 2019-11-04 ENCOUNTER — Other Ambulatory Visit: Payer: Self-pay | Admitting: Family Medicine

## 2019-11-04 DIAGNOSIS — I10 Essential (primary) hypertension: Secondary | ICD-10-CM

## 2019-11-08 ENCOUNTER — Other Ambulatory Visit: Payer: Self-pay

## 2019-11-08 ENCOUNTER — Ambulatory Visit: Payer: Medicaid Other | Admitting: Pain Medicine

## 2019-11-08 ENCOUNTER — Ambulatory Visit (INDEPENDENT_AMBULATORY_CARE_PROVIDER_SITE_OTHER): Payer: Self-pay | Admitting: Neurology

## 2019-11-08 ENCOUNTER — Encounter: Payer: Self-pay | Admitting: Neurology

## 2019-11-08 VITALS — BP 108/82 | HR 76 | Temp 97.4°F | Ht 63.0 in | Wt 214.0 lb

## 2019-11-08 DIAGNOSIS — G8929 Other chronic pain: Secondary | ICD-10-CM

## 2019-11-08 DIAGNOSIS — M542 Cervicalgia: Secondary | ICD-10-CM

## 2019-11-08 DIAGNOSIS — M5441 Lumbago with sciatica, right side: Secondary | ICD-10-CM

## 2019-11-08 DIAGNOSIS — M5442 Lumbago with sciatica, left side: Secondary | ICD-10-CM

## 2019-11-08 NOTE — Progress Notes (Signed)
[facility]    Provider:  Dr Jaynee Eagles Requesting Provider: Hubbard Hartshorn, FNP Primary Care Provider:  Hubbard Hartshorn, FNP  CC:  Chronic migraines, chronic neck, thoracic and low back pain  HPI: Patient here at the request of [name] for chronic pain of low back. PMHx spinal stenosis, fibromyalgia. She says she has chronic low back pain and see Dr. Consuela Mimes completed an MRI lumbar spine and also low back procedure, she follows with him with her chronic neck and low back issue, she has a lot of pain running down both legs, weakness. Runs down the side of the left > right leg, runs into the SI joint down the back of the leg and bottom of the hip, also hip joint pain into the pelvic area. Has been going on for years. She "slipped a disk" in [place] and worsening since then. MRI brain, cervical,thoracic and L-spine within the last year. She has stiffness in the lower back, worse in the morning feels better throughout the day. She has pain across her pelvis and down her leg Nothing makes it better of worse, possibly standing makes it worse, nothing makes it better.Heat helps. She has so much pain (multiple sites, low back, joints, muscles, hips, pelvis, head pain, fibromyalgia). Per patient, she has never been to orthopaedics will send for evaluation to Ortho to review all her spine images, particular the disc at Thoracic level.   MRI lumbar spine:  1. At L1-L2 there is a right paracentral disc protrusion. 2. At L4-L5 minimal broad-based disc bulge.  MRI brain 07/09/2019: 1. No evidence of acute intracranial abnormality. 2. Stable non-contrast MRI appearance of the brain as compared MRI 02/13/2015. A few small nonspecific signal changes within the left frontal lobe white matter are unchanged.  MRI Thoracic 08/01/2019: 1. Moderate sized right paracentral disc protrusion at T7-8 with secondary flattening of the ventral spinal cord and resultant mild spinal stenosis. 2.  Right paracentral disc protrusion at L1-2 without stenosis. 3. Otherwise normal MRI of the thoracic spine.  MRI cervical 07/09/2019:  A small C5-C6 right center disc extrusion has slightly progressed since MRI 11/30/2014. Only mild spinal canal and relative right neural foraminal narrowing at this level.  Cervical spondylosis is otherwise unchanged. Additional small disc protrusions as described. No significant spinal canal or neural foraminal narrowing at the remaining levels.  Interval history 04/06/2019: She is not doing well. She is smelling weird things when something is not there which she says has changed and is different and new, having these episodes.She is having severe headaches, 3-4 days in a dark room a week, she is using her cpap religiously. She is having problems walking, falls. She has not been able to work for years. She is using a walker more recently. She has significant fatigue. She has been having headaches for years, the olfactory hallucinations are new and need to evaluate for seizure focus however this is likely migraine aura. She has daily headaches, and daily migraines. 2-3 days in a week or more she is incapacitated and in bed for 24 hours. She may or may not have an aura. She can't function, trying to get disability. Her insurance status will make it difficult to get botox but we will try. No medication overuse, she rarely take the hydrocodone. Nausea, vomiting, light and sound sensitivity. Starts in the left occipital area, follows the occipital like a "3 fingered claw" from the left occipita and neck area and runs down across shoulder  into the left arm, decreased ROM, chronic nack pain, ataxia and radiculopathic nerve pain all the way down the left arm.   HPI:  Dana Bradley is a 49 y.o. female here as requested by Hubbard Hartshorn, FNP for migraines and visual disturbances. PMHx migraines and visual disturbances, anxiety, chronic fatigue, Fibromyalgia, IBS, sleep apnea,  vertigo, obesity, chronic pain uses a walker.  Had first migraine when pregnant. They were fine until 2008 and headaches/migraines regularly once a week, bad but bearable. Worsening ove rthe years. The last 4 years having daily migraines that last 24-48 hours or sometimes up to 3 days in a row for the same migraine. No aura.  No pattern for when they start, the pain wakes her up and she wakes up in the morning. But can start int he middle of the afternoon. Unknown triggers. Starts in the back of the neck on the left side, runs over the head to her eye and to the left side of the head, it is pulsating/throbbing, waves of pain, nausea, light and sound sensitivity, vomiting, movement makes it worse and a dark room may help a little, radiates to the arm and shoulder blade. She cries. They are severe or moderately severe. She has neck pain.No difference since 2016 since last MRI in frequency or severity or quality, discussed MRI but since no changes we agreed to hold off but keep in mind. She is very compliant with her cpap. Ajovy helped initially but by month 4 it was the same pain. Migraines can last 24-72 hours or longer on average 48 hours. No medication overuse. She doesn;t take anythng because nothing works. Tylenol 3x a month. No aura. No medication overuse.No other focal neurologic deficits, associated symptoms, inciting events or modifiable factors.  Reviewed notes, labs and imaging from outside physicians, which showed:   Migraine meds tried: baclofen, metoprolol, zofran, trileptal, lyrica, sumatriptan, elavil, ajovy, Topamax, amitriptyline, nortriptyline, SPG, RFA on the occipital nerve, trigger point.   CT 12/2014 and MRI brain wo 01/2015  showed no acute intracranial abnormalities including mass lesion or mass effect, hydrocephalus, extra-axial fluid collection, midline shift, hemorrhage, or acute infarction, large ischemic events (personally reviewed images)  ANA, CRP, Sed Rate, cbc  normal   Appears symptoms ongoing for years. In 2016 had headaches, visual disturbances, olfactory hallucinations, numbness and tingling of hands and feet and had an evaluation to include MRI brain    Review of Systems: Patient complains of symptoms per HPI as well as the following symptoms: headaches, sleep apnea. Pertinent negatives and positives per HPI. All others negative.   Social History   Socioeconomic History  . Marital status: Married    Spouse name: brian  . Number of children: 3  . Years of education: Not on file  . Highest education level: Associate degree: academic program  Occupational History  . Occupation: disbled    Comment: not able  Tobacco Use  . Smoking status: Former Smoker    Packs/day: 4.00    Years: 3.00    Pack years: 12.00    Types: Cigarettes    Quit date: 12/10/1992    Years since quitting: 26.9  . Smokeless tobacco: Never Used  . Tobacco comment: quit 25 years ago  Substance and Sexual Activity  . Alcohol use: No    Alcohol/week: 0.0 standard drinks  . Drug use: No  . Sexual activity: Not Currently    Partners: Male    Birth control/protection: Surgical  Other Topics Concern  . Not on  file  Social History Narrative   Lives at home with her husband and 2 of her children   Right handed   Caffeine: 0-2 cups daily   Social Determinants of Health   Financial Resource Strain:   . Difficulty of Paying Living Expenses: Not on file  Food Insecurity:   . Worried About Charity fundraiser in the Last Year: Not on file  . Ran Out of Food in the Last Year: Not on file  Transportation Needs:   . Lack of Transportation (Medical): Not on file  . Lack of Transportation (Non-Medical): Not on file  Physical Activity:   . Days of Exercise per Week: Not on file  . Minutes of Exercise per Session: Not on file  Stress:   . Feeling of Stress : Not on file  Social Connections:   . Frequency of Communication with Friends and Family: Not on file  .  Frequency of Social Gatherings with Friends and Family: Not on file  . Attends Religious Services: Not on file  . Active Member of Clubs or Organizations: Not on file  . Attends Archivist Meetings: Not on file  . Marital Status: Not on file  Intimate Partner Violence:   . Fear of Current or Ex-Partner: Not on file  . Emotionally Abused: Not on file  . Physically Abused: Not on file  . Sexually Abused: Not on file    Family History  Problem Relation Age of Onset  . Depression Mother   . Hypertension Mother   . Cancer Mother        Skin  . Hyperlipidemia Mother   . Anxiety disorder Mother   . Migraines Mother   . Alcohol abuse Father   . Depression Father   . Stroke Father   . Heart disease Father   . Hypertension Father   . Anxiety disorder Father   . Depression Sister   . Hyperlipidemia Sister   . Diabetes Sister   . Hypertension Sister   . Polycystic ovary syndrome Sister   . Bipolar disorder Sister   . Anxiety disorder Sister   . Migraines Sister   . Cancer Maternal Grandmother 21       Breast  . Thyroid disease Maternal Grandmother   . Arthritis Maternal Grandmother   . Hyperlipidemia Maternal Grandmother   . Depression Sister   . Hypertension Sister   . Anxiety disorder Sister   . Migraines Sister   . Alzheimer's disease Other   . Aneurysm Maternal Grandfather   . Hypertension Maternal Grandfather   . Heart disease Maternal Grandfather   . Alzheimer's disease Paternal Grandmother   . Heart attack Paternal Grandfather   . Hypertension Paternal Grandfather   . COPD Paternal Grandfather   . Heart disease Paternal Grandfather   . Migraines Son   . Migraines Daughter   . Migraines Daughter   . Bladder Cancer Neg Hx   . Kidney cancer Neg Hx     Past Medical History:  Diagnosis Date  . Acute postoperative pain 04/07/2017  . Anxiety   . Bursitis   . Chronic fatigue 12/12/2017  . Chronic fatigue syndrome   . Edema leg 05/02/2015  . Fibromyalgia    . GERD (gastroesophageal reflux disease)   . IBS (irritable bowel syndrome)   . Knee pain, bilateral 12/21/2008   Qualifier: Diagnosis of  By: Hassell Done FNP, Tori Milks    . Lumbar discitis   . Migraines   . Osteoarthritis   . Right  hand pain 04/10/2015   [facility] has done nerve conduction studies and ruled out carpal tunnel.   . Sleep apnea   . Spinal stenosis   . SVT (supraventricular tachycardia) ([place])   . Vertigo   . Vitamin D deficiency 05/01/2016    Patient Active Problem List   Diagnosis Date Noted  . Lumbar radiculitis (Right) 09/21/2019  . Chronic lower extremity pain (Bilateral) 09/06/2019  . Intractable migraine with aura without status migrainosus 08/10/2019  . Other specified dorsopathies, sacral and sacrococcygeal region 08/03/2019  . Latex precautions, history of latex allergy 08/03/2019  . History of allergy to radiographic contrast media 08/03/2019  . DDD (degenerative disc disease), cervical 07/21/2019  . Cervical facet syndrome (Bilateral) (L>R) 07/21/2019  . DDD (degenerative disc disease), thoracic 07/21/2019  . Osteoarthritis of hip (Left) 07/21/2019  . Chronic groin pain (Bilateral) (L>R) 07/21/2019  . Chronic hip pain (Bilateral) (L>R) 07/21/2019  . Somatic dysfunction of sacroiliac joint (Bilateral) 07/21/2019  . Migraine with aura and with status migrainosus, not intractable 04/06/2019  . Cervico-occipital neuralgia of left side 04/06/2019  . Weakness of leg (Left) 04/05/2019  . Difficulty walking 04/05/2019  . Chronic migraine without aura, with intractable migraine, so stated, with status migrainosus 12/27/2018  . Malar rash 09/04/2018  . Trigger point of neck (Left) 03/19/2018  . Occipital headache 12/25/2017  . Chronic fatigue syndrome with fibromyalgia 12/12/2017  . Trigger point of shoulder region (Left) 11/17/2017  . Myofascial pain syndrome (Left) (trapezius muscle) 07/22/2017  . Lumbar L1-2 disc protrusion (Right) 04/07/2017  . Muscle  spasticity 04/01/2017  . Osteoarthritis of shoulder (Bilateral) 04/01/2017  . Lumbar spondylosis 01/06/2017  . Chronic hip pain (Left) 12/24/2016  . Chronic sacroiliac joint pain (Left) 12/24/2016  . Lumbar facet syndrome (Bilateral) (L>R) 12/24/2016  . Lumbar radiculitis (Left) 12/24/2016  . Hypertriglyceridemia 11/27/2016  . History of vasovagal episode 10/30/2016  . Cervicogenic headache 09/09/2016  . Medication monitoring encounter 08/29/2016  . Controlled substance agreement signed 08/28/2016  . Plantar fasciitis of left foot 08/28/2016  . Vitamin B12 deficiency 08/28/2016  . Hyperlipidemia 08/28/2016  . Nephrolithiasis 08/12/2016  . Chronic pain syndrome 08/07/2016  . Long term prescription opiate use 08/07/2016  . Opiate use 08/07/2016  . Long term prescription benzodiazepine use 08/07/2016  . Neurogenic pain 08/07/2016  . Chronic low back pain (Primary Area of Pain) (Bilateral) (R>L) (midline) 08/07/2016  . Chronic upper back pain (Secondary area of Pain) (Bilateral) (L>R) 08/07/2016  . Chronic abdominal pain (Right lower quadrant) 08/07/2016  . Thoracic radiculitis (Bilateral: T10, T11) 08/07/2016  . Chronic occipital neuralgia (Third area of Pain) (Bilateral) (L>R) 08/07/2016  . Chronic neck pain 08/07/2016  . Chronic cervical radicular pain (Bilateral) (L>R) 08/07/2016  . Chronic shoulder blade pain (Bilateral) (L>R) 08/07/2016  . Chronic upper extremity pain (Bilateral) (R>L) 08/07/2016  . Chronic knee pain (Bilateral) (R>L) 08/07/2016  . Chronic ankle pain (Bilateral) 08/07/2016  . Cervical spondylosis with myelopathy and radiculopathy 08/07/2016  . Panic disorder with agoraphobia 05/29/2016  . Depression, unspecified depression type 05/29/2016  . Atypical lymphocytosis 05/01/2016  . Vitamin D insufficiency 05/01/2016  . Chronic lower extremity cramps (Bilateral) (R>L) 04/29/2016  . Obesity 04/29/2016  . GAD (generalized anxiety disorder) 04/29/2016  . Fatigue  04/29/2016  . Insomnia 07/12/2015  . Migraine without aura and with status migrainosus, not intractable 07/12/2015  . Chronic superficial gastritis 06/02/2015  . Chronic pain of multiple joints 05/15/2015  . Bilateral leg edema 05/02/2015  . Paroxysmal supraventricular tachycardia ([place])  04/17/2015  . Exertional shortness of breath 04/17/2015  . Bright red rectal bleeding 04/06/2015  . DDD (degenerative disc disease), lumbosacral 01/24/2014  . DDD (degenerative disc disease), lumbar 01/24/2014  . Cervico-occipital neuralgia 12/29/2013  . Fibromyalgia 12/29/2013  . Migraine headache 12/29/2013  . Menorrhagia 12/10/2012  . Depression, major, recurrent, in remission ([place]) 01/12/2009  . Chest pain 01/12/2009  . Hypertension, benign essential, goal below 140/90 06/23/2008  . History of PSVT (paroxysmal supraventricular tachycardia) 06/17/2008  . Obstructive sleep apnea, adult 06/17/2008  . GERD 06/13/2008    Past Surgical History:  Procedure Laterality Date  . ABLATION     Uterine  . CARDIAC CATHETERIZATION     with ablation  . COLONOSCOPY WITH PROPOFOL N/A 05/17/2015   Procedure: COLONOSCOPY WITH PROPOFOL;  Surgeon: Manya Silvas, MD;  Location[facility] ENDOSCOPY;  Service: Endoscopy;  Laterality: N/A;  . ESOPHAGOGASTRODUODENOSCOPY N/A 05/17/2015   Procedure: ESOPHAGOGASTRODUODENOSCOPY (EGD);  Surgeon: Manya Silvas, MD;  Location: [facility] ENDOSCOPY;  Service: Endoscopy;  Laterality: N/A;  . KNEE ARTHROSCOPY    . spg     6/18  . Tibial Tubercle Bypass Right 1998  . TUBAL LIGATION  10/01/99    Current Outpatient Medications  Medication Sig Dispense Refill  . acetaminophen (TYLENOL) 500 MG tablet Take 500-1,000 mg by mouth as needed.    Marland Kitchen atorvastatin (LIPITOR) 40 MG tablet Take 1 tablet (40 mg total) by mouth at bedtime. This replaces crestor (rosuvastatin) 90 tablet 1  . baclofen (LIORESAL) 10 MG tablet Take 1 tablet (10 mg total) by mouth 3 (three) times daily. (Patient taking  differently: Take 10 mg by mouth 3 (three) times daily. As needed.) 90 tablet 5  . Cyanocobalamin (VITAMIN B-12) 500 MCG SUBL Place 1,500 mcg under the tongue every other day.  150 tablet   . Dexlansoprazole (DEXILANT) 30 MG capsule Take 1 capsule (30 mg total) by mouth daily. This replaces omeprazole 30 capsule 2  . diclofenac sodium (VOLTAREN) 1 % GEL Apply 2 g topically 4 (four) times daily. (Patient taking differently: Apply 2 g topically 4 (four) times daily. As needed.) 350 g 3  . dicyclomine (BENTYL) 10 MG capsule Take 1 capsule (10 mg total) by mouth 4 (four) times daily -  before meals and at bedtime. (Patient taking differently: Take 10 mg by mouth 4 (four) times daily -  before meals and at bedtime. As needed.) 90 capsule 1  . HYDROcodone-acetaminophen (NORCO) 7.5-325 MG tablet Take 1 tablet by mouth every 6 (six) hours as needed for severe pain. Must last 90 days 30 tablet 0  . [START ON 12/06/2019] HYDROcodone-acetaminophen (NORCO) 7.5-325 MG tablet Take 1 tablet by mouth every 6 (six) hours as needed for severe pain. Must last 90 days 30 tablet 0  . [START ON 01/05/2020] HYDROcodone-acetaminophen (NORCO) 7.5-325 MG tablet Take 1 tablet by mouth every 6 (six) hours as needed for severe pain. Must last 90 days 30 tablet 0  . magnesium oxide (MAG-OX) 400 MG tablet Take 300 mg by mouth daily.     . Melatonin 10 MG TABS Take 10 mg by mouth at bedtime.    . metoprolol tartrate (LOPRESSOR) 50 MG tablet TAKE ONE TABLET BY MOUTH 2 TIMES A DAY 180 tablet 0  . ondansetron (ZOFRAN) 4 MG tablet Take 1 tablet (4 mg total) by mouth every 8 (eight) hours as needed. 20 tablet 2  . pregabalin (LYRICA) 150 MG capsule Take 1 capsule (150 mg total) by mouth every 8 (eight) hours.  270 capsule 1  . VALERIAN PO Take by mouth as needed. Makes Valerian tea about 3-4 times per week.     No current facility-administered medications for this visit.    Allergies as of 11/08/2019 - Review Complete 11/08/2019  Allergen  Reaction Noted  . Aspirin Swelling 06/13/2008  . Cymbalta [duloxetine hcl] Other (See Comments) 09/09/2016  . Depakote [divalproex sodium] Shortness Of Breath 12/10/2012  . Gadolinium derivatives  07/20/2019  . Haloperidol Shortness Of Breath 05/15/2015  . Meperidine Nausea And Vomiting 12/10/2012  . Reglan [metoclopramide] Shortness Of Breath 09/16/2016  . Tramadol hcl Palpitations 09/09/2016  . Trazodone Shortness Of Breath 05/15/2015  . Compazine [prochlorperazine] Other (See Comments) 09/16/2016  . Meloxicam Other (See Comments) 03/17/2014  . Penicillins Rash 06/13/2008  . Tomato Hives 04/29/2016  . Other  03/17/2014  . Shellfish allergy  03/17/2014  . Shellfish-derived products Other (See Comments) 01/23/2015  . Bacitracin-neomycin-polymyxin Rash 05/15/2015  . Cephalosporins Rash 05/15/2015  . Ibuprofen Other (See Comments) and Rash 01/05/2013  . Latex Itching 03/17/2014  . Neosporin [neomycin-bacitracin zn-polymyx] Rash 12/10/2012  . Nsaids Other (See Comments) 03/17/2014  . Sulfa antibiotics Rash 03/17/2014  . Sulfonamide derivatives Rash 06/13/2008    Vitals: BP 108/82 (BP Location: Left Arm, Patient Position: Sitting, Cuff Size: Large)   Pulse 76   Temp (!) 97.4 F (36.3 C)   Ht 5\' 3"  (1.6 m)   Wt 214 lb (97.1 kg)   BMI 37.91 kg/m  Last Weight:  Wt Readings from Last 1 Encounters:  11/08/19 214 lb (97.1 kg)   Last Height:   Ht Readings from Last 1 Encounters:  11/08/19 5\' 3"  (1.6 m)    Physical exam: Exam: Gen: NAD, conversant, well nourised, obese, well groomed                     CV: RRR, no MRG. No Carotid Bruits. No peripheral edema, warm, nontender Eyes: Conjunctivae clear without exudates or hemorrhage  Neuro: Detailed Neurologic Exam  Speech:    Speech is normal; fluent and spontaneous with normal comprehension.  Cognition:    The patient is oriented to person, place, and time;     recent and remote memory intact;     language fluent;      normal attention, concentration,     fund of knowledge Cranial Nerves:    The pupils are equal, round, and reactive to light. The fundi are normal and spontaneous venous pulsations are present. Visual fields are full to finger confrontation. Extraocular movements are intact. Trigeminal sensation is intact and the muscles of mastication are normal. The face is symmetric. The palate elevates in the midline. Hearing intact. Voice is normal. Shoulder shrug is normal. The tongue has normal motion without fasciculations.   Coordination:    Normal dysmetria  Gait:    Using a cane, antalgic, wide based  Motor Observation:    No asymmetry, no atrophy, and no involuntary movements noted. Tone:    Normal muscle tone.    Posture:    Slightly lordotic possibly body habitus    Strength:left hip flexion 4/5 possibly due to pain unclear if weakness, otherwise strength is V/V in the upper and lower limbs.      Sensation: intact to LT     Reflex Exam:  DTR's:    Deep tendon reflexes in the upper and lower extremities are brisk bilaterally.    Assessment/Plan:  49 y.o. female here as requested by Hubbard Hartshorn, FNP for  migraines and visual disturbances. PMHx migraines and visual disturbances, anxiety, chronic fatigue, Fibromyalgia, IBS, sleep apnea, vertigo, obesity, chronic pain.  - Per patient, she has never been to orthopaedics or anyone surgical for her chronic cervical/thoracic/lumbar spine pain will send for evaluation to Ortho to review all her spine images, particular the disc at Thoracic level to see if anything could possibly be surgically addressed.   - She is here for multiple neurologic complaints today,but today she has a new request for low back pain, chronic pain generalized(says Fibromyalgia) weakness in the legs, shooting pains into the legs, hand numbness and tingling, she haas been extensively imaged (MRI brain, cervical, thoracic,lumbar) without etiology. She sees Dr. Dossie Arbour for  her spine issues. She has had multiple emgs/NCS in the past with other neurologists, she says she has CTS but they did not find anything. I'm not sure we have anything more to offer her at this point; we can repeat the EMG bilateral uppers and left leg however low likelihood of finding anything ne since she has had extensive workup and symptoms ongoing for decades.   Prior Assessment and plans for other multiple issues including migraines, chronic neck pain, occipital neuralgia,   Migraine meds/procedures tried: baclofen, metoprolol, zofran, trileptal, lyrica, sumatriptan, elavil, ajovy, Topamax, amitriptyline, nortriptyline, SPG, RFA on the occipital nerve, trigger point.  - Chronic migraines, intractable - emgality, gave her samples and injected her today. She has 2 more months of samples total 3 months. Unclear if we can continue given her lack of insurance. - MRI of the brain w/wo contrast - MRI brain due to concerning symptoms of chronic intractable headache, olfactory hallucinations  to look for space occupying mass, chiari or intracranial hypertension (pseudotumor), olfactory hallucinations also look for seizure focus  - Chronic neck pain - she has reported difficulty walking, using a walking aid today, She has brisk reflexes and 2 beats AJ which may be normal for her but needs MRI of the cervical spine to eval for cervical radic with myelopathy.  - Left occipital neuralgia - if she can get insurance can send to [facility] for eval of gamma knife but she has failed many medications and procedures she may need an academic headache center  - Try to get botox approval, foundation or maybe Dr. Letta Pate can provide it via her [facility] [employer]  Botox - She has failed multiple classes including CGRP. At this point she needs botox for migraines. (May try to layer Emgality over it at a later time if needed but she failed Ajovy. Has constipation will not try Aimovig). Try to get botox approval,  foundation or maybe Dr. Letta Pate' team can provide it via her [facility] [employer]   Cervical myofascial pain syndrome: dry Needling and PT  Discussed: There is increased risk for stroke in women with migraine with aura and a contraindication for the combined contraceptive pill for use by women who have migraine with aura. The risk for women with migraine without aura is lower. However other risk factors like smoking are far more likely to increase stroke risk than migraine. There is a recommendation for no smoking and for the use of OCPs without estrogen such as progestogen only pills particularly for women with migraine with aura.Marland Kitchen People who have migraine headaches with auras may be 3 times more likely to have a stroke caused by a blood clot, compared to migraine patients who don't see auras. Women who take hormone-replacement therapy may be 30 percent more likely to suffer a clot-based  stroke than women not taking medication containing estrogen. Other risk factors like smoking and high blood pressure may be  much more important.  No orders of the defined types were placed in this encounter.  Orders Placed This Encounter  Procedures  . Ambulatory referral to Orthopedic Surgery   Discussed: To prevent or relieve headaches, try the following: Cool Compress. Lie down and place a cool compress on your head.  Avoid headache triggers. If certain foods or odors seem to have triggered your migraines in the past, avoid them. A headache diary might help you identify triggers.  Include physical activity in your daily routine. Try a daily walk or other moderate aerobic exercise.  Manage stress. Find healthy ways to cope with the stressors, such as delegating tasks on your to-do list.  Practice relaxation techniques. Try deep breathing, yoga, massage and visualization.  Eat regularly. Eating regularly scheduled meals and maintaining a healthy diet might help prevent headaches. Also, drink plenty of  fluids.  Follow a regular sleep schedule. Sleep deprivation might contribute to headaches Consider biofeedback. With this mind-body technique, you learn to control certain bodily functions -- such as muscle tension, heart rate and blood pressure -- to prevent headaches or reduce headache pain.    Proceed to emergency room if you experience new or worsening symptoms or symptoms do not resolve, if you have new neurologic symptoms or if headache is severe, or for any concerning symptom.   Provided education and documentation from American headache Society toolbox including articles on: chronic migraine medication overuse headache, chronic migraines, prevention of migraines, behavioral and other nonpharmacologic treatments for headache.  A total of 40 minutes was spent on this patient's care, reviewing imaging, past records, recent hospitalization notes and results. Over half this time was spent on counseling patient on the  1. Chronic bilateral low back pain with bilateral sciatica   2. Chronic neck pain    diagnosis and different diagnostic and therapeutic options, counseling and coordination of care, risks and benefitsof management, compliance, or risk factor reduction and education.       Cc: Hubbard Hartshorn, FNP,    Sarina Ill, MD  [facility] [address] [place], [place] [address]  Phone [phone] Fax [phone]

## 2019-11-08 NOTE — Patient Instructions (Signed)
Send to orthopaedic surgery

## 2019-11-09 ENCOUNTER — Other Ambulatory Visit: Payer: Self-pay

## 2019-11-09 ENCOUNTER — Ambulatory Visit: Payer: Self-pay | Attending: Pain Medicine

## 2019-11-09 DIAGNOSIS — Z9181 History of falling: Secondary | ICD-10-CM | POA: Insufficient documentation

## 2019-11-09 DIAGNOSIS — M6281 Muscle weakness (generalized): Secondary | ICD-10-CM | POA: Insufficient documentation

## 2019-11-09 DIAGNOSIS — R252 Cramp and spasm: Secondary | ICD-10-CM | POA: Insufficient documentation

## 2019-11-10 ENCOUNTER — Ambulatory Visit (INDEPENDENT_AMBULATORY_CARE_PROVIDER_SITE_OTHER): Payer: Self-pay | Admitting: Gastroenterology

## 2019-11-10 ENCOUNTER — Encounter: Payer: Self-pay | Admitting: Gastroenterology

## 2019-11-10 ENCOUNTER — Other Ambulatory Visit: Payer: Self-pay

## 2019-11-10 VITALS — BP 120/80 | HR 72 | Temp 98.5°F | Ht 63.0 in | Wt 213.4 lb

## 2019-11-10 DIAGNOSIS — K580 Irritable bowel syndrome with diarrhea: Secondary | ICD-10-CM

## 2019-11-10 DIAGNOSIS — R197 Diarrhea, unspecified: Secondary | ICD-10-CM

## 2019-11-10 NOTE — Progress Notes (Signed)
Jonathon Bellows MD, MRCP(U.K) [address]  [place]  [place], [place] [address]  Main: [phone]  Fax: [phone]   Gastroenterology Consultation  Referring Provider:     Hubbard Hartshorn, FNP Primary Care Physician:  Hubbard Hartshorn, FNP Primary Gastroenterologist:  Dr. Jonathon Bellows  Reason for Consultation:    Abdominal pain and diarrhea        HPI:   Dana Bradley is a 49 y.o. y/o female referred for consultation & management  by Hubbard Hartshorn, FNP.    She was previously a patient of [facility] gastroenterology.  Had seen Dr. Vira Agar last in 2016.  Known to have internal hemorrhoids per her last colonoscopy in 2016.  Also known to have acid reflux with esophagitis in 2016 per her endoscopy.  No evidence of Barrett's esophagus.  Back in 2016 at her last visit with a gastroenterologist she complained of right upper quadrant pain beneath her right rib.   10/18/2019 CT scan of the abdomen and pelvis with contrast demonstrates mild hepatic steatosis but no acute intra-abdominal pathology.  09/21/2019: CBC: Normal: Acute hepatitis panel negative.  CMP normal.  November 2020 GI PCR negative.  She was seen back in December 2020 by her family nurse practitioner for abdominal pain.  Ongoing since August along with diarrhea.   She says that she has been having the diarrhea since August 2020 got better for a while and then got worse.  She has a longstanding history of irritable bowel syndrome with diarrhea.  She is not on any Metformin.  She consumes some sweet and low in her drinks daily.  No chewing gum.  She is up to 2 or 3 bowel movements which are very runny sticky and smelly each day.  Sometimes appear to be pale-colored.  She takes Dexilant for her acid reflux.  She says she has generalized abdominal pain on and off crampy in nature associated with watery diarrhea and relieved after bowel movement.  Stress test make it worse. Past Medical History:  Diagnosis Date  . Acute postoperative pain  04/07/2017  . Anxiety   . Bursitis   . Chronic fatigue 12/12/2017  . Chronic fatigue syndrome   . Edema leg 05/02/2015  . Fibromyalgia   . GERD (gastroesophageal reflux disease)   . IBS (irritable bowel syndrome)   . Knee pain, bilateral 12/21/2008   Qualifier: Diagnosis of  By: Hassell Done FNP, Tori Milks    . Lumbar discitis   . Migraines   . Osteoarthritis   . Right hand pain 04/10/2015   [facility] has done nerve conduction studies and ruled out carpal tunnel.   . Sleep apnea   . Spinal stenosis   . SVT (supraventricular tachycardia) ([place])   . Vertigo   . Vitamin D deficiency 05/01/2016    Past Surgical History:  Procedure Laterality Date  . ABLATION     Uterine  . CARDIAC CATHETERIZATION     with ablation  . COLONOSCOPY WITH PROPOFOL N/A 05/17/2015   Procedure: COLONOSCOPY WITH PROPOFOL;  Surgeon: Manya Silvas, MD;  Location: [facility] ENDOSCOPY;  Service: Endoscopy;  Laterality: N/A;  . ESOPHAGOGASTRODUODENOSCOPY N/A 05/17/2015   Procedure: ESOPHAGOGASTRODUODENOSCOPY (EGD);  Surgeon: Manya Silvas, MD;  Location: [facility] ENDOSCOPY;  Service: Endoscopy;  Laterality: N/A;  . KNEE ARTHROSCOPY    . spg     6/18  . Tibial Tubercle Bypass Right 1998  . TUBAL LIGATION  10/01/99    Prior to Admission medications   Medication Sig Start  Date End Date Taking? Authorizing Provider  acetaminophen (TYLENOL) 500 MG tablet Take 500-1,000 mg by mouth as needed.    [provider]  atorvastatin (LIPITOR) 40 MG tablet Take 1 tablet (40 mg total) by mouth at bedtime. This replaces crestor (rosuvastatin) 12/10/18   Hubbard Hartshorn, FNP  baclofen (LIORESAL) 10 MG tablet Take 1 tablet (10 mg total) by mouth 3 (three) times daily. Patient taking differently: Take 10 mg by mouth 3 (three) times daily. As needed. 08/03/19 01/30/20  Milinda Pointer, MD  Cyanocobalamin (VITAMIN B-12) 500 MCG SUBL Place 1,500 mcg under the tongue every other day.  11/20/17   Lada, Satira Anis, MD  Dexlansoprazole  (DEXILANT) 30 MG capsule Take 1 capsule (30 mg total) by mouth daily. This replaces omeprazole 08/03/19   Hubbard Hartshorn, FNP  diclofenac sodium (VOLTAREN) 1 % GEL Apply 2 g topically 4 (four) times daily. Patient taking differently: Apply 2 g topically 4 (four) times daily. As needed. 08/03/19 01/30/20  Milinda Pointer, MD  dicyclomine (BENTYL) 10 MG capsule Take 1 capsule (10 mg total) by mouth 4 (four) times daily -  before meals and at bedtime. Patient taking differently: Take 10 mg by mouth 4 (four) times daily -  before meals and at bedtime. As needed. 07/26/19   Hubbard Hartshorn, FNP  HYDROcodone-acetaminophen (NORCO) 7.5-325 MG tablet Take 1 tablet by mouth every 6 (six) hours as needed for severe pain. Must last 90 days 11/06/19 12/06/19  Milinda Pointer, MD  HYDROcodone-acetaminophen ([place]) 7.5-325 MG tablet Take 1 tablet by mouth every 6 (six) hours as needed for severe pain. Must last 90 days 12/06/19 01/05/20  Milinda Pointer, MD  HYDROcodone-acetaminophen ([place]) 7.5-325 MG tablet Take 1 tablet by mouth every 6 (six) hours as needed for severe pain. Must last 90 days 01/05/20 02/04/20  Milinda Pointer, MD  magnesium oxide (MAG-OX) 400 MG tablet Take 300 mg by mouth daily.     [provider]  Melatonin 10 MG TABS Take 10 mg by mouth at bedtime.    [provider]  metoprolol tartrate (LOPRESSOR) 50 MG tablet TAKE ONE TABLET BY MOUTH 2 TIMES A DAY 10/25/19   Hubbard Hartshorn, FNP  ondansetron (ZOFRAN) 4 MG tablet Take 1 tablet (4 mg total) by mouth every 8 (eight) hours as needed. 11/25/18   Hubbard Hartshorn, FNP  pregabalin (LYRICA) 150 MG capsule Take 1 capsule (150 mg total) by mouth every 8 (eight) hours. 09/21/19 03/19/20  Milinda Pointer, MD  VALERIAN PO Take by mouth as needed. Makes Valerian tea about 3-4 times per week.    [provider]    Family History  Problem Relation Age of Onset  . Depression Mother   . Hypertension Mother   . Cancer Mother         Skin  . Hyperlipidemia Mother   . Anxiety disorder Mother   . Migraines Mother   . Alcohol abuse Father   . Depression Father   . Stroke Father   . Heart disease Father   . Hypertension Father   . Anxiety disorder Father   . Depression Sister   . Hyperlipidemia Sister   . Diabetes Sister   . Hypertension Sister   . Polycystic ovary syndrome Sister   . Bipolar disorder Sister   . Anxiety disorder Sister   . Migraines Sister   . Cancer Maternal Grandmother 67       Breast  . Thyroid disease Maternal Grandmother   .  Arthritis Maternal Grandmother   . Hyperlipidemia Maternal Grandmother   . Depression Sister   . Hypertension Sister   . Anxiety disorder Sister   . Migraines Sister   . Alzheimer's disease Other   . Aneurysm Maternal Grandfather   . Hypertension Maternal Grandfather   . Heart disease Maternal Grandfather   . Alzheimer's disease Paternal Grandmother   . Heart attack Paternal Grandfather   . Hypertension Paternal Grandfather   . COPD Paternal Grandfather   . Heart disease Paternal Grandfather   . Migraines Son   . Migraines Daughter   . Migraines Daughter   . Bladder Cancer Neg Hx   . Kidney cancer Neg Hx      Social History   Tobacco Use  . Smoking status: Former Smoker    Packs/day: 4.00    Years: 3.00    Pack years: 12.00    Types: Cigarettes    Quit date: 12/10/1992    Years since quitting: 26.9  . Smokeless tobacco: Never Used  . Tobacco comment: quit 25 years ago  Substance Use Topics  . Alcohol use: No    Alcohol/week: 0.0 standard drinks  . Drug use: No    Allergies as of 11/10/2019 - Review Complete 11/10/2019  Allergen Reaction Noted  . Aspirin Swelling 06/13/2008  . Cymbalta [duloxetine hcl] Other (See Comments) 09/09/2016  . Depakote [divalproex sodium] Shortness Of Breath 12/10/2012  . Gadolinium derivatives  07/20/2019  . Haloperidol Shortness Of Breath 05/15/2015  . Meperidine Nausea And Vomiting 12/10/2012  . Reglan  [metoclopramide] Shortness Of Breath 09/16/2016  . Tramadol hcl Palpitations 09/09/2016  . Trazodone Shortness Of Breath 05/15/2015  . Compazine [prochlorperazine] Other (See Comments) 09/16/2016  . Meloxicam Other (See Comments) 03/17/2014  . Penicillins Rash 06/13/2008  . Tomato Hives 04/29/2016  . Other  03/17/2014  . Shellfish allergy  03/17/2014  . Shellfish-derived products Other (See Comments) 01/23/2015  . Bacitracin-neomycin-polymyxin Rash 05/15/2015  . Cephalosporins Rash 05/15/2015  . Ibuprofen Other (See Comments) and Rash 01/05/2013  . Latex Itching 03/17/2014  . Neosporin [neomycin-bacitracin zn-polymyx] Rash 12/10/2012  . Nsaids Other (See Comments) 03/17/2014  . Sulfa antibiotics Rash 03/17/2014  . Sulfonamide derivatives Rash 06/13/2008    Review of Systems:    All systems reviewed and negative except where noted in HPI.   Physical Exam:  There were no vitals taken for this visit. No LMP recorded. Psych:  Alert and cooperative. Normal mood and affect. General:   Alert,  Well-developed, well-nourished, pleasant and cooperative in NAD Head:  Normocephalic and atraumatic. Eyes:  Sclera clear, no icterus.   Conjunctiva pink. Ears:  Normal auditory acuity. Abdomen:  Normal bowel sounds.  No bruits.  Soft, non-tender and non-distended without masses, hepatosplenomegaly or hernias noted.  No guarding or rebound tenderness.    Neurologic:  Alert and oriented x3;  grossly normal neurologically. Psych:  Alert and cooperative. Normal mood and affect.  Imaging Studies: CT ABDOMEN PELVIS W CONTRAST  Result Date: 10/18/2019 CLINICAL DATA:  Acute generalized abdominal pain with diarrhea and cramping. EXAM: CT ABDOMEN AND PELVIS WITH CONTRAST TECHNIQUE: Multidetector CT imaging of the abdomen and pelvis was performed using the standard protocol following bolus administration of intravenous contrast. CONTRAST:  186mL OMNIPAQUE IOHEXOL 300 MG/ML  SOLN COMPARISON:  None. FINDINGS:  Lower chest: Very minor dependent basilar atelectasis/hypoventilatory changes. Normal heart size. No pericardial or pleural effusion. Esophagus is nondilated. No hiatal hernia. Hepatobiliary: Mild decreased hepatic attenuation compatible with hepatic steatosis. No focal hepatic abnormality  or intrahepatic biliary dilatation. Gallbladder nondilated. Common bile duct nondistended. Pancreas: Unremarkable. No pancreatic ductal dilatation or surrounding inflammatory changes. Spleen: Normal in size without focal abnormality. Adrenals/Urinary Tract: Normal adrenal glands. No renal obstruction or hydronephrosis. Ureters are symmetric and decompressed. No obstructing ureteral or urinary tract calculus. Right kidney demonstrates a lower pole nonobstructing radiopaque calculus measuring 12 mm. No focal renal abnormality. Stomach/Bowel: Nonspecific distention of the stomach. Negative for bowel obstruction, significant dilatation, ileus, or free air. Very small nondilated appendix suspected in the retrocecal area. No acute inflammatory process appreciated. No free fluid, fluid collection, hemorrhage, hematoma, or abscess. Vascular/Lymphatic: No significant atherosclerotic process. Mesenteric and renal vasculature all appear patent. Negative for aneurysm or acute vascular process. No veno-occlusive disease. No bulky adenopathy. Reproductive: Uterus normal in size and anteverted in position. Small ovarian cysts noted bilaterally measuring 2.7 cm on the left and 2.4 cm on the right. No significant pelvic free fluid, fluid collection, hemorrhage, hematoma, or abscess. Other: No abdominal wall hernia or abnormality. No abdominopelvic ascites. Musculoskeletal: No acute osseous finding. IMPRESSION: No acute intra-abdominal or pelvic finding by CT. Mild hepatic steatosis Nonobstructing right nephrolithiasis Bilateral ovarian cysts, measurements above Electronically Signed   By: Jerilynn Mages.  Shick M.D.   On: 10/18/2019 15:37    Assessment and  Plan:   Dana Bradley is a 49 y.o. y/o female has been referred for abdominal pain and diarrhea.  Likely a combination of gas bloating and irritable bowel syndrome with diarrhea.  She is on Bentyl which helps her which she takes up to 4 times a day.  Plan 1.  H. pylori breath test, stool for C. difficile diarrhea, fecal calprotectin.  Celiac serology. 2.  Trial of Creon for extrapancreatic insufficiency.  Trial of IBgard for functional pain.  If above tests are negative then may consider a course of Xifaxan for irritable bowel syndrome with diarrhea and if it does not work may need colonoscopy with biopsies to rule out microscopic colitis.   Follow up in 3 weeks telephone visit  Dr Jonathon Bellows MD,MRCP(U.K)
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
## 2019-11-16 ENCOUNTER — Ambulatory Visit: Payer: Self-pay

## 2019-11-16 ENCOUNTER — Other Ambulatory Visit: Payer: Self-pay

## 2019-11-16 ENCOUNTER — Ambulatory Visit (INDEPENDENT_AMBULATORY_CARE_PROVIDER_SITE_OTHER): Payer: Self-pay | Admitting: Orthopaedic Surgery

## 2019-11-16 ENCOUNTER — Encounter: Payer: Self-pay | Attending: Physical Medicine and Rehabilitation | Admitting: Physical Medicine & Rehabilitation

## 2019-11-16 ENCOUNTER — Encounter: Payer: Self-pay | Admitting: Physical Medicine & Rehabilitation

## 2019-11-16 ENCOUNTER — Encounter: Payer: Self-pay | Admitting: Orthopaedic Surgery

## 2019-11-16 VITALS — BP 107/74 | HR 84 | Ht 63.0 in | Wt 214.0 lb

## 2019-11-16 VITALS — BP 113/76 | HR 82 | Temp 97.7°F | Ht 63.0 in | Wt 215.0 lb

## 2019-11-16 DIAGNOSIS — Z6838 Body mass index (BMI) 38.0-38.9, adult: Secondary | ICD-10-CM | POA: Insufficient documentation

## 2019-11-16 DIAGNOSIS — M5030 Other cervical disc degeneration, unspecified cervical region: Secondary | ICD-10-CM

## 2019-11-16 DIAGNOSIS — G9332 Myalgic encephalomyelitis/chronic fatigue syndrome: Secondary | ICD-10-CM

## 2019-11-16 DIAGNOSIS — M5136 Other intervertebral disc degeneration, lumbar region: Secondary | ICD-10-CM

## 2019-11-16 DIAGNOSIS — M797 Fibromyalgia: Secondary | ICD-10-CM | POA: Insufficient documentation

## 2019-11-16 DIAGNOSIS — M4722 Other spondylosis with radiculopathy, cervical region: Secondary | ICD-10-CM

## 2019-11-16 DIAGNOSIS — R5382 Chronic fatigue, unspecified: Secondary | ICD-10-CM

## 2019-11-16 DIAGNOSIS — M4712 Other spondylosis with myelopathy, cervical region: Secondary | ICD-10-CM

## 2019-11-16 DIAGNOSIS — M7918 Myalgia, other site: Secondary | ICD-10-CM | POA: Insufficient documentation

## 2019-11-16 DIAGNOSIS — G43119 Migraine with aura, intractable, without status migrainosus: Secondary | ICD-10-CM | POA: Insufficient documentation

## 2019-11-16 NOTE — Progress Notes (Signed)
Office Visit Note   Patient: Dana Bradley           Date of Birth: 11/24/1970           MRN: [id number] Visit Date: 11/16/2019              Requested by: Melvenia Beam, [place],  [place] [id number] PCP: Hubbard Hartshorn, FNP   Assessment & Plan: Visit Diagnoses:  1. Cervical spondylosis with myelopathy and radiculopathy   2. Chronic fatigue syndrome with fibromyalgia   3. DDD (degenerative disc disease), lumbar   4. DDD (degenerative disc disease), cervical     Plan: Encouraged her to continue with therapy.  We discussed wellness getting outside working in her garden some that she enjoys.  Walking when the weather is better.  No operative intervention is recommended and she has had pretty much all choices of different medication that likely might be helpful with her symptoms.  Questions were elicited we reviewed her cervical MRI, thoracic MRI and lumbar MRI images as well as report with patient and her husband is present.  They are encouraged that they are doing the best they can and will try to work on a general wellness program.  Follow-Up Instructions: No follow-ups on file.   Orders:  No orders of the defined types were placed in this encounter.  No orders of the defined types were placed in this encounter.     Procedures: No procedures performed   Clinical Data: No additional findings.   Subjective: Chief Complaint  Patient presents with  . Neck - Pain  . Lower Back - Pain    HPI 49 year old female seen with total spine pain that is been present since 2000.  She is amatory with a cane and has more problems with pain around her left hip left thigh radiating down to her left foot.  She has had MRI cervical spine which showed trace disc protrusion at C5-6.  Thoracic MRI November 2020 showed moderate sized disc protrusion at C7 T8 with mild stenosis.  Patient relates some pain that she has it radiates around just below her breast right and left at  times.  MRI scan lumbar spine October 2020 showed L1-2 right paracentral disc protrusion with no foraminal or central stenosis.  L4-5 minimal disc bulge without compression.  Patient had additional problems with migraines, depression, knee pain, neck pain, thoracic pain, occipital neuralgia.  She has been seen at [facility] by neurosurgery service, neurology service.  5 years prior to that neurology service at [facility].  Included with there are several sheets documenting her symptoms after epidural injections.  She has had sacroiliac injections coccyx injections facet injections epidural injections.  Medications tried include muscle relaxants, tricyclic's, antidepressants, Lyrica, Neurontin, Norco 7.5/325 30 tablets usually last third 6 to 8 weeks.  She has been on vitamins baclofen, currently in physical therapy.  She is ambulating with single-point cane.  She is not worked in several years.  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  Job after that she did not last long with standing with increased pain.  Patient has more than 10 allergies.    Review of Systems positive for leg edema, paroxysmal supraventricular tachycardia, anxiety, former smoker.  Brain MRI showed some small vessel changes nonspecific changes.  She notes some decreased memory.  Decreased balance.  Increased swelling.   Objective: Vital Signs: BP 107/74   Pulse 84   Ht 5\' 3"  (1.6 m)  Wt 214 lb (97.1 kg)   BMI 37.91 kg/m   Physical Exam Constitutional:      Appearance: She is well-developed.  HENT:     Head: Normocephalic.     Right Ear: External ear normal.     Left Ear: External ear normal.  Eyes:     Pupils: Pupils are equal, round, and reactive to light.  Neck:     Thyroid: No thyromegaly.     Trachea: No tracheal deviation.  Cardiovascular:     Rate and Rhythm: Normal rate.  Pulmonary:     Effort: Pulmonary effort is normal.  Abdominal:     Palpations: Abdomen is soft.  Skin:    General: Skin is warm and dry.   Neurological:     Mental Status: She is alert and oriented to person, place, and time.  Psychiatric:        Behavior: Behavior normal.     Ortho Exam patient has a 2+ upper extremity reflexes.  Some tenderness mid thoracic region with palpation.  She jumps and withdraws with positive pinch test over the lumbar spine.  Jumps and cries out with static palpation trochanteric tenderness is less impressive.  Negative logroll the hips right and left.  Knees reach full extension negative popliteal compression test.  Knee and ankle jerk are 2+ and symmetrical.  Anterior tib gastrocsoleus is strong.  Specialty Comments:  No specialty comments available.  Imaging: No results found.   PMFS History: Patient Active Problem List   Diagnosis Date Noted  . Lumbar radiculitis (Right) 09/21/2019  . Chronic lower extremity pain (Bilateral) 09/06/2019  . Intractable migraine with aura without status migrainosus 08/10/2019  . Other specified dorsopathies, sacral and sacrococcygeal region 08/03/2019  . Latex precautions, history of latex allergy 08/03/2019  . History of allergy to radiographic contrast media 08/03/2019  . DDD (degenerative disc disease), cervical 07/21/2019  . Cervical facet syndrome (Bilateral) (L>R) 07/21/2019  . DDD (degenerative disc disease), thoracic 07/21/2019  . Osteoarthritis of hip (Left) 07/21/2019  . Chronic groin pain (Bilateral) (L>R) 07/21/2019  . Chronic hip pain (Bilateral) (L>R) 07/21/2019  . Somatic dysfunction of sacroiliac joint (Bilateral) 07/21/2019  . Migraine with aura and with status migrainosus, not intractable 04/06/2019  . Cervico-occipital neuralgia of left side 04/06/2019  . Weakness of leg (Left) 04/05/2019  . Difficulty walking 04/05/2019  . Chronic migraine without aura, with intractable migraine, so stated, with status migrainosus 12/27/2018  . Malar rash 09/04/2018  . Trigger point of neck (Left) 03/19/2018  . Occipital headache 12/25/2017  .  Chronic fatigue syndrome with fibromyalgia 12/12/2017  . Trigger point of shoulder region (Left) 11/17/2017  . Myofascial pain syndrome (Left) (trapezius muscle) 07/22/2017  . Lumbar L1-2 disc protrusion (Right) 04/07/2017  . Muscle spasticity 04/01/2017  . Osteoarthritis of shoulder (Bilateral) 04/01/2017  . Lumbar spondylosis 01/06/2017  . Chronic hip pain (Left) 12/24/2016  . Chronic sacroiliac joint pain (Left) 12/24/2016  . Lumbar facet syndrome (Bilateral) (L>R) 12/24/2016  . Lumbar radiculitis (Left) 12/24/2016  . Hypertriglyceridemia 11/27/2016  . History of vasovagal episode 10/30/2016  . Cervicogenic headache 09/09/2016  . Medication monitoring encounter 08/29/2016  . Controlled substance agreement signed 08/28/2016  . Plantar fasciitis of left foot 08/28/2016  . Vitamin B12 deficiency 08/28/2016  . Hyperlipidemia 08/28/2016  . Nephrolithiasis 08/12/2016  . Chronic pain syndrome 08/07/2016  . Long term prescription opiate use 08/07/2016  . Opiate use 08/07/2016  . Long term prescription benzodiazepine use 08/07/2016  . Neurogenic pain 08/07/2016  .  Chronic low back pain (Primary Area of Pain) (Bilateral) (R>L) (midline) 08/07/2016  . Chronic upper back pain (Secondary area of Pain) (Bilateral) (L>R) 08/07/2016  . Chronic abdominal pain (Right lower quadrant) 08/07/2016  . Thoracic radiculitis (Bilateral: T10, T11) 08/07/2016  . Chronic occipital neuralgia (Third area of Pain) (Bilateral) (L>R) 08/07/2016  . Chronic neck pain 08/07/2016  . Chronic cervical radicular pain (Bilateral) (L>R) 08/07/2016  . Chronic shoulder blade pain (Bilateral) (L>R) 08/07/2016  . Chronic upper extremity pain (Bilateral) (R>L) 08/07/2016  . Chronic knee pain (Bilateral) (R>L) 08/07/2016  . Chronic ankle pain (Bilateral) 08/07/2016  . Cervical spondylosis with myelopathy and radiculopathy 08/07/2016  . Panic disorder with agoraphobia 05/29/2016  . Depression, unspecified depression type  05/29/2016  . Atypical lymphocytosis 05/01/2016  . Vitamin D insufficiency 05/01/2016  . Chronic lower extremity cramps (Bilateral) (R>L) 04/29/2016  . Obesity 04/29/2016  . GAD (generalized anxiety disorder) 04/29/2016  . Fatigue 04/29/2016  . Insomnia 07/12/2015  . Migraine without aura and with status migrainosus, not intractable 07/12/2015  . Chronic superficial gastritis 06/02/2015  . Chronic pain of multiple joints 05/15/2015  . Bilateral leg edema 05/02/2015  . Paroxysmal supraventricular tachycardia ([place]) 04/17/2015  . Exertional shortness of breath 04/17/2015  . Bright red rectal bleeding 04/06/2015  . DDD (degenerative disc disease), lumbosacral 01/24/2014  . DDD (degenerative disc disease), lumbar 01/24/2014  . Cervico-occipital neuralgia 12/29/2013  . Fibromyalgia 12/29/2013  . Migraine headache 12/29/2013  . Menorrhagia 12/10/2012  . Depression, major, recurrent, in remission ([place]) 01/12/2009  . Chest pain 01/12/2009  . Hypertension, benign essential, goal below 140/90 06/23/2008  . History of PSVT (paroxysmal supraventricular tachycardia) 06/17/2008  . Obstructive sleep apnea, adult 06/17/2008  . GERD 06/13/2008   Past Medical History:  Diagnosis Date  . Acute postoperative pain 04/07/2017  . Anxiety   . Bursitis   . Chronic fatigue 12/12/2017  . Chronic fatigue syndrome   . Edema leg 05/02/2015  . Fibromyalgia   . GERD (gastroesophageal reflux disease)   . IBS (irritable bowel syndrome)   . Knee pain, bilateral 12/21/2008   Qualifier: Diagnosis of  By: Hassell Done FNP, Tori Milks    . Lumbar discitis   . Migraines   . Osteoarthritis   . Right hand pain 04/10/2015   [facility] has done nerve conduction studies and ruled out carpal tunnel.   . Sleep apnea   . Spinal stenosis   . SVT (supraventricular tachycardia) ([place])   . Vertigo   . Vitamin D deficiency 05/01/2016    Family History  Problem Relation Age of Onset  . Depression Mother   . Hypertension  Mother   . Cancer Mother        Skin  . Hyperlipidemia Mother   . Anxiety disorder Mother   . Migraines Mother   . Alcohol abuse Father   . Depression Father   . Stroke Father   . Heart disease Father   . Hypertension Father   . Anxiety disorder Father   . Depression Sister   . Hyperlipidemia Sister   . Diabetes Sister   . Hypertension Sister   . Polycystic ovary syndrome Sister   . Bipolar disorder Sister   . Anxiety disorder Sister   . Migraines Sister   . Cancer Maternal Grandmother 2       Breast  . Thyroid disease Maternal Grandmother   . Arthritis Maternal Grandmother   . Hyperlipidemia Maternal Grandmother   . Depression Sister   . Hypertension Sister   .  Anxiety disorder Sister   . Migraines Sister   . Alzheimer's disease Other   . Aneurysm Maternal Grandfather   . Hypertension Maternal Grandfather   . Heart disease Maternal Grandfather   . Alzheimer's disease Paternal Grandmother   . Heart attack Paternal Grandfather   . Hypertension Paternal Grandfather   . COPD Paternal Grandfather   . Heart disease Paternal Grandfather   . Migraines Son   . Migraines Daughter   . Migraines Daughter   . Bladder Cancer Neg Hx   . Kidney cancer Neg Hx     Past Surgical History:  Procedure Laterality Date  . ABLATION     Uterine  . CARDIAC CATHETERIZATION     with ablation  . COLONOSCOPY WITH PROPOFOL N/A 05/17/2015   Procedure: COLONOSCOPY WITH PROPOFOL;  Surgeon: Manya Silvas, MD;  Location[facility] ENDOSCOPY;  Service: Endoscopy;  Laterality: N/A;  . ESOPHAGOGASTRODUODENOSCOPY N/A 05/17/2015   Procedure: ESOPHAGOGASTRODUODENOSCOPY (EGD);  Surgeon: Manya Silvas, MD;  Location: [facility] ENDOSCOPY;  Service: Endoscopy;  Laterality: N/A;  . KNEE ARTHROSCOPY    . spg     6/18  . Tibial Tubercle Bypass Right 1998  . TUBAL LIGATION  10/01/99   Social History   Occupational History  . Occupation: disbled    Comment: not able  Tobacco Use  . Smoking status: Former  Smoker    Packs/day: 4.00    Years: 3.00    Pack years: 12.00    Types: Cigarettes    Quit date: 12/10/1992    Years since quitting: 26.9  . Smokeless tobacco: Never Used  . Tobacco comment: quit 25 years ago  Substance and Sexual Activity  . Alcohol use: No    Alcohol/week: 0.0 standard drinks  . Drug use: No  . Sexual activity: Not Currently    Partners: Male    Birth control/protection: Surgical

## 2019-11-16 NOTE — Progress Notes (Signed)
Botox Injection for chronic migraine headaches  Dilution: 100 Units/ 24ml preservative free NS x2 (Samples used) Indication: refractory headaches (At least 15 days per month/headache lasting greater than 4 hours per day) incompletely responsive to other more conservative measures.  Informed consent was obtained after describing risks and benefits of the procedure with the patient. This includes bleeding, bruising, infection, excessive weakness, or medication side effects. A REMS form is on file and signed. Needle: 30g 1/2 inch needle  HPI: Female with chronic intractable migraines presents for Botox injections for migraines.    Number of units per muscle:  Right temporalis 20 units, 4 access points Left temporalis 20 units,  4 access points Right frontalis 10 units, 2 access points Left frontalis 10 units, 2 access points Procerus 5 units, 1 access point Right corrugator 5 units, 1 access point Left corrugator 5 units, 1 access point Right occipitalis 15 units, 3 access points Left occipitalis 30 units, 3 access points Right cervical paraspinal 10 units, 2 access points Left cervical paraspinals 20 units, 2 access points  Right trapezius 15 units, 3 access points Left trapezius 15 units, 3 access points   Remaining 20 units were discarded due to unavoidable waste.   All injections were done after  after negative drawback for blood. Patient had some vasovagal symptoms (history of) post procedure, which resolved with laying flat and hydrating for 5 minutes. Post procedure instructions were given. A followup appointment was made.

## 2019-11-17 ENCOUNTER — Other Ambulatory Visit: Payer: Self-pay | Admitting: Family Medicine

## 2019-11-17 DIAGNOSIS — I10 Essential (primary) hypertension: Secondary | ICD-10-CM

## 2019-11-18 ENCOUNTER — Ambulatory Visit: Payer: Self-pay

## 2019-11-22 ENCOUNTER — Other Ambulatory Visit: Payer: Self-pay

## 2019-11-22 ENCOUNTER — Ambulatory Visit: Payer: Self-pay

## 2019-11-22 DIAGNOSIS — Z9181 History of falling: Secondary | ICD-10-CM

## 2019-11-22 DIAGNOSIS — M6281 Muscle weakness (generalized): Secondary | ICD-10-CM

## 2019-11-22 DIAGNOSIS — R252 Cramp and spasm: Secondary | ICD-10-CM
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
## 2019-11-22 NOTE — Telephone Encounter (Signed)
Dana Bradley can we get her some help with CREON or Zenpep and in meanwhile give her some samples. F/u visit virtual or in person which ever patient prefer in a few weeks

## 2019-11-22 NOTE — Addendum Note (Signed)
Addended by: Blain Pais on: 11/22/2019 05:09 PM   Modules accepted: Orders

## 2019-11-23 ENCOUNTER — Other Ambulatory Visit: Payer: Self-pay

## 2019-11-23 MED ORDER — PANCRELIPASE (LIP-PROT-AMYL) 36000-114000 UNITS PO CPEP: ORAL_CAPSULE | ORAL | 5 refills | Status: DC

## 2019-11-24 ENCOUNTER — Ambulatory Visit: Payer: Self-pay

## 2019-11-24 ENCOUNTER — Other Ambulatory Visit: Payer: Self-pay

## 2019-11-24 DIAGNOSIS — R252 Cramp and spasm: Secondary | ICD-10-CM

## 2019-11-24 DIAGNOSIS — Z9181 History of falling: Secondary | ICD-10-CM

## 2019-11-24 DIAGNOSIS — M6281 Muscle weakness (generalized): Secondary | ICD-10-CM
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
## 2019-11-29 ENCOUNTER — Ambulatory Visit: Payer: Self-pay

## 2019-11-30 ENCOUNTER — Ambulatory Visit: Payer: Self-pay | Admitting: Gastroenterology

## 2019-12-01 ENCOUNTER — Other Ambulatory Visit: Payer: Self-pay

## 2019-12-01 ENCOUNTER — Encounter: Payer: Self-pay | Admitting: Family Medicine

## 2019-12-01 DIAGNOSIS — I10 Essential (primary) hypertension: Secondary | ICD-10-CM

## 2019-12-01 MED ORDER — METOPROLOL TARTRATE 50 MG PO TABS: ORAL_TABLET | ORAL | 0 refills | Status: DC

## 2019-12-01 NOTE — Telephone Encounter (Signed)
Please resend to [facility]

## 2019-12-02 ENCOUNTER — Ambulatory Visit: Payer: Self-pay

## 2019-12-07 ENCOUNTER — Other Ambulatory Visit
Admission: RE | Admit: 2019-12-07 | Discharge: 2019-12-07 | Disposition: A | Discharge status: Home / Self Care | Payer: Medicaid Other | Source: Ambulatory Visit | Attending: Gastroenterology | Admitting: Gastroenterology

## 2019-12-07 ENCOUNTER — Ambulatory Visit: Payer: Self-pay | Attending: Pain Medicine

## 2019-12-07 ENCOUNTER — Other Ambulatory Visit: Payer: Self-pay

## 2019-12-07 DIAGNOSIS — R252 Cramp and spasm: Secondary | ICD-10-CM | POA: Insufficient documentation

## 2019-12-07 DIAGNOSIS — M25521 Pain in right elbow: Secondary | ICD-10-CM | POA: Insufficient documentation

## 2019-12-07 DIAGNOSIS — G8929 Other chronic pain: Secondary | ICD-10-CM | POA: Insufficient documentation

## 2019-12-07 DIAGNOSIS — M542 Cervicalgia: Secondary | ICD-10-CM | POA: Insufficient documentation

## 2019-12-07 DIAGNOSIS — M5440 Lumbago with sciatica, unspecified side: Secondary | ICD-10-CM | POA: Insufficient documentation

## 2019-12-07 DIAGNOSIS — Z9181 History of falling: Secondary | ICD-10-CM | POA: Insufficient documentation

## 2019-12-07 DIAGNOSIS — M6281 Muscle weakness (generalized): Secondary | ICD-10-CM | POA: Insufficient documentation

## 2019-12-08 LAB — H. PYLORI BREATH TEST: H. pylori UBiT: NEGATIVE
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
## 2019-12-09 ENCOUNTER — Other Ambulatory Visit: Payer: Self-pay

## 2019-12-09 ENCOUNTER — Ambulatory Visit: Payer: Self-pay

## 2019-12-09 DIAGNOSIS — M25521 Pain in right elbow: Secondary | ICD-10-CM

## 2019-12-09 DIAGNOSIS — R252 Cramp and spasm: Secondary | ICD-10-CM

## 2019-12-09 DIAGNOSIS — G8929 Other chronic pain: Secondary | ICD-10-CM

## 2019-12-09 DIAGNOSIS — M6281 Muscle weakness (generalized): Secondary | ICD-10-CM

## 2019-12-09 DIAGNOSIS — Z9181 History of falling: Secondary | ICD-10-CM

## 2019-12-09 DIAGNOSIS — M5440 Lumbago with sciatica, unspecified side: Secondary | ICD-10-CM

## 2019-12-09 LAB — CELIAC DISEASE PANEL
Endomysial Ab, IgA: NEGATIVE
IgA: 156 mg/dL (ref 87–352)
Tissue Transglutaminase Ab, IgA: <2 U/mL (ref 0–3)
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
## 2019-12-12 ENCOUNTER — Encounter: Payer: Self-pay | Admitting: Gastroenterology

## 2019-12-13 ENCOUNTER — Other Ambulatory Visit: Payer: Self-pay | Admitting: Physical Medicine and Rehabilitation

## 2019-12-13 DIAGNOSIS — Z889 Allergy status to unspecified drugs, medicaments and biological substances status: Secondary | ICD-10-CM

## 2019-12-13 DIAGNOSIS — G43119 Migraine with aura, intractable, without status migrainosus: Secondary | ICD-10-CM

## 2019-12-13 MED ORDER — BUTALBITAL-APAP-CAFFEINE 50-325-40 MG PO TABS: 1.0000 | ORAL_TABLET | Freq: Four times a day (QID) | ORAL | 0 refills | Status: DC | PRN

## 2019-12-15 ENCOUNTER — Ambulatory Visit: Payer: Self-pay

## 2019-12-17 ENCOUNTER — Other Ambulatory Visit: Payer: Self-pay | Admitting: Physical Medicine and Rehabilitation

## 2019-12-17 MED ORDER — BUTALBITAL-APAP-CAFFEINE 50-325-40 MG PO TABS: 1.0000 | ORAL_TABLET | Freq: Four times a day (QID) | ORAL | 0 refills | Status: DC | PRN

## 2019-12-22 ENCOUNTER — Other Ambulatory Visit
Admission: RE | Admit: 2019-12-22 | Discharge: 2019-12-22 | Disposition: A | Discharge status: Home / Self Care | Payer: Medicaid Other | Source: Ambulatory Visit | Attending: Gastroenterology | Admitting: Gastroenterology

## 2019-12-22 ENCOUNTER — Telehealth: Payer: Self-pay | Admitting: Family Medicine

## 2019-12-22 ENCOUNTER — Other Ambulatory Visit: Payer: Self-pay | Admitting: Family Medicine

## 2019-12-22 DIAGNOSIS — R109 Unspecified abdominal pain: Secondary | ICD-10-CM

## 2019-12-22 DIAGNOSIS — R1011 Right upper quadrant pain: Secondary | ICD-10-CM

## 2019-12-22 DIAGNOSIS — E782 Mixed hyperlipidemia: Secondary | ICD-10-CM

## 2019-12-22 DIAGNOSIS — I10 Essential (primary) hypertension: Secondary | ICD-10-CM

## 2019-12-22 DIAGNOSIS — R197 Diarrhea, unspecified: Secondary | ICD-10-CM | POA: Insufficient documentation

## 2019-12-22 LAB — C DIFFICILE QUICK SCREEN W PCR REFLEX
C Diff antigen: NEGATIVE
C Diff interpretation: NOT DETECTED
C Diff toxin: NEGATIVE

## 2019-12-22 NOTE — Telephone Encounter (Signed)
Requested medication (s) are due for refill today:yes  Requested medication (s) are on the active medication list:yes  Last refill: 07/26/19  Future visit scheduled:NO  Notes to clinic: Pharmacy requesting new RX.  State patient has no refills remaining?    Requested Prescriptions  Pending Prescriptions Disp Refills   dicyclomine (BENTYL) 10 MG capsule [Pharmacy Med Name: DICYCLOMINE 10 MG CAPSULE] 90 capsule 0    Sig: TAKE ONE CAPSULE BY MOUTH FOUR TIMES A DAY - BEFORE MEALS AND AT BEDTIME      Gastroenterology:  Antispasmodic Agents Passed - 12/22/2019 10:12 AM      Passed - Last Heart Rate in normal range    Pulse Readings from Last 1 Encounters:  11/16/19 82          Passed - Valid encounter within last 12 months    Recent Outpatient Visits           3 months ago Generalized abdominal pain   [place], FNP   4 months ago RUQ pain   [place], Astrid Divine, FNP   8 months ago Left elbow pain   [facility] Fredderick Severance, NP   1 year ago Migraine without aura and with status migrainosus, not intractable   [place], Astrid Divine, FNP   1 year ago Malar rash   [place], Astrid Divine, [place]       Future Appointments             In 1 month Milinda Pointer, MD [place]              atorvastatin (LIPITOR) 40 MG tablet [Pharmacy Med Name: ATORVASTATIN 40 MG TABLET] 60 tablet 0    Sig: TAKE ONE TABLET BY MOUTH AT BEDTIME      Cardiovascular:  Antilipid - Statins Failed - 12/22/2019 10:12 AM      Failed - HDL in normal range and within 360 days    HDL  Date Value Ref Range Status  08/01/2019 35 (L) >40 mg/dL Final          Failed - Triglycerides in normal range and within 360 days    Triglycerides  Date Value Ref Range Status  08/01/2019 358 (H) <150 mg/dL Final         Passed - Total Cholesterol in normal range and within 360 days    Cholesterol  Date Value Ref Range Status  08/01/2019 158 0 - 200 mg/dL Final          Passed - LDL in normal range and within 360 days    LDL Cholesterol (Calc)  Date Value Ref Range Status  03/05/2018  mg/dL (calc) Final    Comment:    . LDL cholesterol not calculated. Triglyceride levels greater than 400 mg/dL invalidate calculated LDL results. . Reference range: <100 . Desirable range <100 mg/dL for primary prevention;   <70 mg/dL for patients with CHD or diabetic patients  with > or = 2 CHD risk factors. Marland Kitchen LDL-C is now calculated using the Martin-Hopkins  calculation, which is a validated novel method providing  better accuracy than the Friedewald equation in the  estimation of LDL-C.  Cresenciano Genre et al. Annamaria Helling. [id number]): 2061-2068  (http://education.[employer].com/faq/FAQ164)    LDL Cholesterol  Date Value Ref Range Status  08/01/2019 51 0 - 99 mg/dL Final    Comment:  Total Cholesterol/HDL:CHD Risk Coronary Heart Disease Risk Table                     Men   Women  1/2 Average Risk   3.4   3.3  Average Risk       5.0   4.4  2 X Average Risk   9.6   7.1  3 X Average Risk  23.4   11.0        Use the calculated Patient Ratio above and the CHD Risk Table to determine the patient's CHD Risk.        ATP III CLASSIFICATION (LDL):  <100     mg/dL   Optimal  100-129  mg/dL   Near or Above                    Optimal  130-159  mg/dL   Borderline  160-189  mg/dL   High  >190     mg/dL   Very High Performed at [facility], [address], [place], [place] [address]           Passed - Patient is not pregnant      Passed - Valid encounter within last 12 months    Recent Outpatient Visits           3 months ago Generalized abdominal pain   [place], FNP   4 months ago RUQ pain   [place], FNP    8 months ago Left elbow pain   [place], NP   1 year ago Migraine without aura and with status migrainosus, not intractable   [place], FNP   1 year ago Malar rash   [place], Astrid Divine, [place]       Future Appointments             In 1 month Milinda Pointer, MD [place]

## 2019-12-23 ENCOUNTER — Ambulatory Visit: Payer: Medicaid Other | Attending: Internal Medicine

## 2019-12-23 ENCOUNTER — Encounter: Payer: Self-pay | Admitting: Gastroenterology

## 2019-12-23 DIAGNOSIS — Z23 Encounter for immunization: Secondary | ICD-10-CM

## 2019-12-23 LAB — CALPROTECTIN, FECAL: Calprotectin, Fecal: 28 ug/g (ref 0–120)

## 2019-12-23 NOTE — Progress Notes (Signed)
   Covid-19 Vaccination Clinic  Name:  Dana Bradley    MRN: [id number] DOB: 15-Aug-1971  12/23/2019  Dana Bradley was observed post Covid-19 immunization for 30 minutes based on pre-vaccination screening without incident. She was provided with Vaccine Information Sheet and instruction to access the V-Safe system.   Dana Bradley was instructed to call 911 with any severe reactions post vaccine: Marland Kitchen Difficulty breathing  . Swelling of face and throat  . A fast heartbeat  . A bad rash all over body  . Dizziness and weakness   Immunizations Administered    Name Date Dose VIS Date Route   Moderna COVID-19 Vaccine 12/23/2019  9:32 AM 0.5 mL 08/31/2019 Intramuscular   Manufacturer: Moderna   Lot: [id number]   [id number]

## 2019-12-24 ENCOUNTER — Telehealth: Payer: Self-pay | Admitting: Pharmacy Technician

## 2019-12-24 NOTE — Telephone Encounter (Signed)
Received updated proof of income.  Patient eligible to receive medication assistance at [facility] until time for re-certification in 9359, and as long as eligibility requirements continue to be met.  [facility]

## 2019-12-27 ENCOUNTER — Other Ambulatory Visit: Payer: Self-pay

## 2019-12-27 ENCOUNTER — Ambulatory Visit: Payer: Self-pay

## 2019-12-27 DIAGNOSIS — Z9181 History of falling: Secondary | ICD-10-CM

## 2019-12-27 DIAGNOSIS — M5440 Lumbago with sciatica, unspecified side: Secondary | ICD-10-CM

## 2019-12-27 DIAGNOSIS — M6281 Muscle weakness (generalized): Secondary | ICD-10-CM

## 2019-12-27 DIAGNOSIS — R252 Cramp and spasm: Secondary | ICD-10-CM

## 2019-12-27 DIAGNOSIS — M25521 Pain in right elbow: Secondary | ICD-10-CM

## 2019-12-27 DIAGNOSIS — G8929 Other chronic pain: Secondary | ICD-10-CM

## 2019-12-28 ENCOUNTER — Other Ambulatory Visit: Payer: Self-pay

## 2019-12-28 ENCOUNTER — Ambulatory Visit: Payer: Self-pay

## 2019-12-28 ENCOUNTER — Encounter: Payer: Self-pay | Admitting: Physical Medicine & Rehabilitation

## 2019-12-28 ENCOUNTER — Encounter: Payer: Self-pay | Attending: Physical Medicine and Rehabilitation | Admitting: Physical Medicine & Rehabilitation

## 2019-12-28 VITALS — BP 109/76 | HR 69 | Temp 97.7°F | Ht 63.0 in | Wt 220.0 lb

## 2019-12-28 DIAGNOSIS — M7918 Myalgia, other site: Secondary | ICD-10-CM | POA: Insufficient documentation

## 2019-12-28 DIAGNOSIS — M797 Fibromyalgia: Secondary | ICD-10-CM

## 2019-12-28 DIAGNOSIS — M542 Cervicalgia: Secondary | ICD-10-CM

## 2019-12-28 DIAGNOSIS — R5382 Chronic fatigue, unspecified: Secondary | ICD-10-CM

## 2019-12-28 DIAGNOSIS — Z6838 Body mass index (BMI) 38.0-38.9, adult: Secondary | ICD-10-CM | POA: Insufficient documentation

## 2019-12-28 DIAGNOSIS — M25521 Pain in right elbow: Secondary | ICD-10-CM

## 2019-12-28 DIAGNOSIS — G43119 Migraine with aura, intractable, without status migrainosus: Secondary | ICD-10-CM

## 2019-12-28 DIAGNOSIS — G8929 Other chronic pain: Secondary | ICD-10-CM

## 2019-12-28 DIAGNOSIS — R252 Cramp and spasm: Secondary | ICD-10-CM

## 2019-12-28 NOTE — Progress Notes (Signed)
Subjective:    Patient ID: Dana Bradley, female    DOB: 1970-12-10, 49 y.o.   MRN: [id number]  HPI  Female presents for follow up for migraines.  Last clinic visit on 11/16/2019.  She had Botox for migraines at that time.  Since that time, she notes 4 days last week migraine free. She was keeping a migraine headache app, but her phone stopped working.  Headaches ~3/week (last week less), lasting 36 hours. Usually on left.  Starting left neck/shoulder radiating to left scalp and shoulder.   Pain Inventory Average Pain 6 Pain Right Now 5 My pain is intermittent, sharp, burning and aching  In the last 24 hours, has pain interfered with the following? General activity 5 Relation with others 8 Enjoyment of life 7 What TIME of day is your pain at its worst? evening and night Sleep (in general) Fair  Pain is worse with: bending and some activites Pain improves with: heat/ice, therapy/exercise and medication Relief from Meds: 5  Mobility use a cane how many minutes can you walk? 20 do you drive?  yes  Function not employed: date last employed 8/18 I need assistance with the following:  dressing, bathing, meal prep, household duties and shopping  Neuro/Psych bladder control problems bowel control problems weakness numbness tingling trouble walking spasms confusion loss of taste or smell  Prior Studies Any changes since last visit?  no  Physicians involved in your care Primary care [facility]   Family History  Problem Relation Age of Onset  . Depression Mother   . Hypertension Mother   . Cancer Mother        Skin  . Hyperlipidemia Mother   . Anxiety disorder Mother   . Migraines Mother   . Alcohol abuse Father   . Depression Father   . Stroke Father   . Heart disease Father   . Hypertension Father   . Anxiety disorder Father   . Depression Sister   . Hyperlipidemia Sister   . Diabetes Sister   . Hypertension Sister   . Polycystic  ovary syndrome Sister   . Bipolar disorder Sister   . Anxiety disorder Sister   . Migraines Sister   . Cancer Maternal Grandmother 73       Breast  . Thyroid disease Maternal Grandmother   . Arthritis Maternal Grandmother   . Hyperlipidemia Maternal Grandmother   . Depression Sister   . Hypertension Sister   . Anxiety disorder Sister   . Migraines Sister   . Alzheimer's disease Other   . Aneurysm Maternal Grandfather   . Hypertension Maternal Grandfather   . Heart disease Maternal Grandfather   . Alzheimer's disease Paternal Grandmother   . Heart attack Paternal Grandfather   . Hypertension Paternal Grandfather   . COPD Paternal Grandfather   . Heart disease Paternal Grandfather   . Migraines Son   . Migraines Daughter   . Migraines Daughter   . Bladder Cancer Neg Hx   . Kidney cancer Neg Hx    Social History   Socioeconomic History  . Marital status: Married    Spouse name: brian  . Number of children: 3  . Years of education: Not on file  . Highest education level: Associate degree: academic program  Occupational History  . Occupation: disbled    Comment: not able  Tobacco Use  . Smoking status: Former Smoker    Packs/day: 4.00    Years: 3.00    Pack years: 12.00  Types: Cigarettes    Quit date: 12/10/1992    Years since quitting: 27.0  . Smokeless tobacco: Never Used  . Tobacco comment: quit 25 years ago  Substance and Sexual Activity  . Alcohol use: No    Alcohol/week: 0.0 standard drinks  . Drug use: No  . Sexual activity: Not Currently    Partners: Male    Birth control/protection: Surgical  Other Topics Concern  . Not on file  Social History Narrative   Lives at home with her husband and 2 of her children   Right handed   Caffeine: 0-2 cups daily   Social Determinants of Health   Financial Resource Strain:   . Difficulty of Paying Living Expenses:   Food Insecurity:   . Worried About Charity fundraiser in the Last Year:   . Arboriculturist  in the Last Year:   Transportation Needs:   . Film/video editor (Medical):   Marland Kitchen Lack of Transportation (Non-Medical):   Physical Activity:   . Days of Exercise per Week:   . Minutes of Exercise per Session:   Stress:   . Feeling of Stress :   Social Connections:   . Frequency of Communication with Friends and Family:   . Frequency of Social Gatherings with Friends and Family:   . Attends Religious Services:   . Active Member of Clubs or Organizations:   . Attends Archivist Meetings:   Marland Kitchen Marital Status:    Past Surgical History:  Procedure Laterality Date  . ABLATION     Uterine  . CARDIAC CATHETERIZATION     with ablation  . COLONOSCOPY WITH PROPOFOL N/A 05/17/2015   Procedure: COLONOSCOPY WITH PROPOFOL;  Surgeon: Manya Silvas, MD;  Location: [facility] ENDOSCOPY;  Service: Endoscopy;  Laterality: N/A;  . ESOPHAGOGASTRODUODENOSCOPY N/A 05/17/2015   Procedure: ESOPHAGOGASTRODUODENOSCOPY (EGD);  Surgeon: Manya Silvas, MD;  Location: [facility] ENDOSCOPY;  Service: Endoscopy;  Laterality: N/A;  . KNEE ARTHROSCOPY    . spg     6/18  . Tibial Tubercle Bypass Right 1998  . TUBAL LIGATION  10/01/99   Past Medical History:  Diagnosis Date  . Acute postoperative pain 04/07/2017  . Anxiety   . Bursitis   . Chronic fatigue 12/12/2017  . Chronic fatigue syndrome   . Edema leg 05/02/2015  . Fibromyalgia   . GERD (gastroesophageal reflux disease)   . IBS (irritable bowel syndrome)   . Knee pain, bilateral 12/21/2008   Qualifier: Diagnosis of  By: Hassell Done FNP, Tori Milks    . Lumbar discitis   . Migraines   . Osteoarthritis   . Right hand pain 04/10/2015   [facility] has done nerve conduction studies and ruled out carpal tunnel.   . Sleep apnea   . Spinal stenosis   . SVT (supraventricular tachycardia) ([place])   . Vertigo   . Vitamin D deficiency 05/01/2016   BP 109/76   Pulse 69   Temp 97.7 F (36.5 C)   Ht 5\' 3"  (1.6 m)   Wt 220 lb (99.8 kg)   SpO2 96%   BMI  38.97 kg/m   Opioid Risk Score:   Fall Risk Score:  `1  Depression screen PHQ 2/9  Depression screen [facility] 2/9 09/28/2019 09/21/2019 07/30/2019 07/26/2019 04/20/2019 11/25/2018 09/04/2018  Decreased Interest 0 1 1 1  0 2 2  Down, Depressed, Hopeless 0 1 0 1 0 1 1  PHQ - 2 Score 0 2 1 2  0 3 3  Altered sleeping - 1 2 1  0 3 3  Tired, decreased energy - 1 3 1  0 3 3  Change in appetite - 1 1 1  0 2 3  Feeling bad or failure about yourself  - 1 1 1  0 1 2  Trouble concentrating - 1 1 1  0 3 1  Moving slowly or fidgety/restless - 1 1 1  0 0 0  Suicidal thoughts - 0 0 0 0 0 1  PHQ-9 Score - 8 10 8  0 15 16  Difficult doing work/chores - - Somewhat difficult Somewhat difficult Not difficult at all Very difficult Somewhat difficult  Some recent data might be hidden    Review of Systems  Constitutional: Negative.   Eyes: Negative.   Musculoskeletal: Positive for arthralgias, back pain, gait problem, neck pain and neck stiffness.  Neurological: Positive for dizziness and headaches. Negative for numbness.      Objective:   Physical Exam Constitutional: No distress .  Cardiovascular: No JVD. Respiratory: Normal effort.   Skin: Warm and dry.  Intact. Psych: Normal mood.  Normal behavior. Musc: +TTP temporal area b/l Gait: Antalgic Neuro: Alert and oriented    Assessment & Plan:  Female presents for follow up for migraines.  1. Migraines  Benefit with Botox injection on 08/10/2019 and ?again after 2/16  Continue follow up with Dr. Adam Phenix as planned  Plan for repeat injections, 3rd set  Cont PT  Patient scheduled to see allergist   2. Gait abnormality  No longer requiring cane  Cont therapies
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
## 2019-12-30 ENCOUNTER — Telehealth: Payer: Self-pay | Admitting: Pharmacist

## 2019-12-30 NOTE — Telephone Encounter (Signed)
12/30/2019 11:31:38 AM - Creon [id number] faxed to [place] - Thursday, December 30, 2019 11:30 AM --Ewell Poe application for Creon [id number]  Take 2 capsules by mouth with each meal & 1 capsule with each snack  #8 bottles with all financial documents and letter explaining spouses income.

## 2020-01-04 ENCOUNTER — Other Ambulatory Visit: Payer: Self-pay

## 2020-01-04 ENCOUNTER — Ambulatory Visit: Payer: Self-pay | Attending: Pain Medicine

## 2020-01-04 DIAGNOSIS — M542 Cervicalgia: Secondary | ICD-10-CM | POA: Insufficient documentation

## 2020-01-04 DIAGNOSIS — R252 Cramp and spasm: Secondary | ICD-10-CM | POA: Insufficient documentation

## 2020-01-04 DIAGNOSIS — G8929 Other chronic pain: Secondary | ICD-10-CM | POA: Insufficient documentation

## 2020-01-04 DIAGNOSIS — M5440 Lumbago with sciatica, unspecified side: Secondary | ICD-10-CM | POA: Insufficient documentation

## 2020-01-04 DIAGNOSIS — M25521 Pain in right elbow: Secondary | ICD-10-CM | POA: Insufficient documentation

## 2020-01-05 NOTE — Telephone Encounter (Signed)
Please schedule patient appointment

## 2020-01-05 NOTE — Telephone Encounter (Signed)
lvm for scheduling °
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
## 2020-01-06 ENCOUNTER — Other Ambulatory Visit: Payer: Self-pay

## 2020-01-06 ENCOUNTER — Ambulatory Visit: Payer: Self-pay

## 2020-01-06 DIAGNOSIS — G8929 Other chronic pain: Secondary | ICD-10-CM

## 2020-01-06 DIAGNOSIS — M25521 Pain in right elbow: Secondary | ICD-10-CM

## 2020-01-06 DIAGNOSIS — R252 Cramp and spasm: Secondary | ICD-10-CM

## 2020-01-06 DIAGNOSIS — M542 Cervicalgia: Secondary | ICD-10-CM
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
## 2020-01-10 ENCOUNTER — Ambulatory Visit: Payer: Medicaid Other

## 2020-01-10 ENCOUNTER — Ambulatory Visit: Payer: Self-pay

## 2020-01-10 ENCOUNTER — Other Ambulatory Visit: Payer: Self-pay

## 2020-01-10 DIAGNOSIS — Z79899 Other long term (current) drug therapy: Secondary | ICD-10-CM

## 2020-01-10 NOTE — Progress Notes (Signed)
Medication Management Clinic Visit Note  Patient: Dana Bradley MRN: [id number] Date of Birth: April 05, 1971 PCP: Hubbard Hartshorn, FNP   [name] 49 y.o. female presents for a telephone medication therapy management visit with the pharmacist today. Patient identity verified using two patient identifiers.  There were no vitals taken for this visit.  Patient Information   Past Medical History:  Diagnosis Date  . Acute postoperative pain 04/07/2017  . Anxiety   . Bursitis   . Chronic fatigue 12/12/2017  . Chronic fatigue syndrome   . Edema leg 05/02/2015  . Fibromyalgia   . GERD (gastroesophageal reflux disease)   . IBS (irritable bowel syndrome)   . Knee pain, bilateral 12/21/2008   Qualifier: Diagnosis of  By: Hassell Done FNP, Tori Milks    . Lumbar discitis   . Migraines   . Osteoarthritis   . Right hand pain 04/10/2015   [facility] has done nerve conduction studies and ruled out carpal tunnel.   . Sleep apnea   . Spinal stenosis   . SVT (supraventricular tachycardia) ([place])   . Vertigo   . Vitamin D deficiency 05/01/2016      Past Surgical History:  Procedure Laterality Date  . ABLATION     Uterine  . CARDIAC CATHETERIZATION     with ablation  . COLONOSCOPY WITH PROPOFOL N/A 05/17/2015   Procedure: COLONOSCOPY WITH PROPOFOL;  Surgeon: Manya Silvas, MD;  Location: [facility] ENDOSCOPY;  Service: Endoscopy;  Laterality: N/A;  . ESOPHAGOGASTRODUODENOSCOPY N/A 05/17/2015   Procedure: ESOPHAGOGASTRODUODENOSCOPY (EGD);  Surgeon: Manya Silvas, MD;  Location: [facility] ENDOSCOPY;  Service: Endoscopy;  Laterality: N/A;  . KNEE ARTHROSCOPY    . spg     6/18  . Tibial Tubercle Bypass Right 1998  . TUBAL LIGATION  10/01/99     Family History  Problem Relation Age of Onset  . Depression Mother   . Hypertension Mother   . Cancer Mother        Skin  . Hyperlipidemia Mother   . Anxiety disorder Mother   . Migraines Mother   . Alcohol abuse Father   . Depression Father   . Stroke  Father   . Heart disease Father   . Hypertension Father   . Anxiety disorder Father   . Depression Sister   . Hyperlipidemia Sister   . Diabetes Sister   . Hypertension Sister   . Polycystic ovary syndrome Sister   . Bipolar disorder Sister   . Anxiety disorder Sister   . Migraines Sister   . Cancer Maternal Grandmother 68       Breast  . Thyroid disease Maternal Grandmother   . Arthritis Maternal Grandmother   . Hyperlipidemia Maternal Grandmother   . Depression Sister   . Hypertension Sister   . Anxiety disorder Sister   . Migraines Sister   . Alzheimer's disease Other   . Aneurysm Maternal Grandfather   . Hypertension Maternal Grandfather   . Heart disease Maternal Grandfather   . Alzheimer's disease Paternal Grandmother   . Heart attack Paternal Grandfather   . Hypertension Paternal Grandfather   . COPD Paternal Grandfather   . Heart disease Paternal Grandfather   . Migraines Son   . Migraines Daughter   . Migraines Daughter   . Bladder Cancer Neg Hx   . Kidney cancer Neg Hx     New Diagnoses (since last visit): Patient reports being worked up for condition causing abdominal pain, cramping, diarrhea. Following with gastroenterology.  Family Support: Good  Lifestyle Diet: Breakfast: Special K cereal with blueberries Lunch: Stir fry (pork, brussel sprouts, mushrooms, soy sauce, noodles) Dinner: Baked chicken, mashed butternut squash, green beans Drinks: Coffee, water, sweet tea (home-made)  Exercise: Walking and gardening. Exercise limited by pain. Patient reports using a cane or walker to help ambulate.     Social History   Substance and Sexual Activity  Alcohol Use No  . Alcohol/week: 0.0 standard drinks   Denies alcohol use   Social History   Tobacco Use  Smoking Status Former Smoker  . Packs/day: 4.00  . Years: 3.00  . Pack years: 12.00  . Types: Cigarettes  . Quit date: 12/10/1992  . Years since quitting: 27.1  Smokeless Tobacco Never Used   Tobacco Comment   quit 25 years ago   Patient reports she is still abstinent from smoking. Denies illicit substances.   Health Maintenance  Topic Date Due  . PAP SMEAR-Modifier  09/22/2019  . INFLUENZA VACCINE  04/30/2020  . TETANUS/TDAP  09/30/2021  . HIV Screening  Completed   Outpatient Encounter Medications as of 01/10/2020  Medication Sig  . acetaminophen (TYLENOL) 500 MG tablet Take 500-1,000 mg by mouth as needed.  . baclofen (LIORESAL) 10 MG tablet Take 1 tablet (10 mg total) by mouth 3 (three) times daily. (Patient taking differently: Take 10 mg by mouth 3 (three) times daily. As needed.)  . butalbital-acetaminophen-caffeine (FIORICET) 50-325-40 MG tablet Take 1-2 tablets by mouth every 6 (six) hours as needed for headache.  . Cyanocobalamin (VITAMIN B-12) 500 MCG SUBL Place 1,500 mcg under the tongue every other day.   Marland Kitchen Dexlansoprazole (DEXILANT) 30 MG capsule Take 1 capsule (30 mg total) by mouth daily. This replaces omeprazole  . diclofenac sodium (VOLTAREN) 1 % GEL Apply 2 g topically 4 (four) times daily. (Patient taking differently: Apply 2 g topically 4 (four) times daily. As needed.)  . dicyclomine (BENTYL) 10 MG capsule Take 1 capsule (10 mg total) by mouth 4 (four) times daily -  before meals and at bedtime. (Patient taking differently: Take 10 mg by mouth 4 (four) times daily -  before meals and at bedtime. As needed.)  . HYDROcodone-acetaminophen (NORCO) 7.5-325 MG tablet Take 1 tablet by mouth every 6 (six) hours as needed for severe pain. Must last 90 days  . lipase/protease/amylase (CREON) 36000 UNITS CPEP capsule Take 2 caps with each meal and 1 cap with each snack  . magnesium oxide (MAG-OX) 400 MG tablet Take 300 mg by mouth daily.   . Melatonin 10 MG TABS Take 10 mg by mouth at bedtime.  . metoprolol tartrate (LOPRESSOR) 50 MG tablet TAKE ONE TABLET BY MOUTH 2 TIMES A DAY  . ondansetron (ZOFRAN) 4 MG tablet Take 1 tablet (4 mg total) by mouth every 8 (eight)  hours as needed.  . pregabalin (LYRICA) 150 MG capsule Take 1 capsule (150 mg total) by mouth every 8 (eight) hours.  Marland Kitchen VALERIAN PO Take by mouth as needed. Makes Valerian tea about 3-4 times per week.  Marland Kitchen atorvastatin (LIPITOR) 40 MG tablet Take 1 tablet (40 mg total) by mouth at bedtime. This replaces crestor (rosuvastatin) (Patient not taking: Reported on 01/10/2020)  . HYDROcodone-acetaminophen (NORCO) 7.5-325 MG tablet Take 1 tablet by mouth every 6 (six) hours as needed for severe pain. Must last 90 days  . HYDROcodone-acetaminophen (NORCO) 7.5-325 MG tablet Take 1 tablet by mouth every 6 (six) hours as needed for severe pain. Must last 90 days   No facility-administered encounter medications  on file as of 01/10/2020.    Health Maintenance/Date Completed  Last ED visit: Patient denies any recent ED visits PCP: [name], FNP Specialist Visit: Patient follows with neurology, gastroenterology, orthopedics, pain, sports medicine Dental Exam: No dental coverage at this time Eye Exam: Wears glasses (bifocals). Last eye exam was in 07/2019 Pelvic/PAP Exam: Reports last was within 3 years Mammogram: Reports last was several years ago. Recommended discussing referral to [facility] with PCP DEXA: < 82 y/o Colonoscopy: 05/17/2015 Flu Vaccine: Due Pneumonia Vaccine: Never COVID-19 Vaccine: Moderna 1/2 complete. 2nd dose due 4/27 Shingrix Vaccine: < 85 y/o  Assessment and Plan:  Chronic pain / Fibromyalgia / osteoarthritis -Hydrocodone/APAP 1 tab q6h PRN, baclofen 10 mg TID PRN, pregabalin 150 mg q8h, diclofenac gel  Leg edema -No apparent history of CHF -Worse if on feet for prolonged periods of time -Patient reports elevating feet  -Does not have compression stockings  Irritable bowel syndrome -Following with gastroenterology -Creon 2 capsules with meals, 1 capsule with snacks, dicyclomine PRN  Migraines -Follows with sports medicine -Patient reports daily  migraines. Has noted some improvement since starting Fioricet -Fioricet 1-2 tabs q6h PRN, magnesium, ondansetron 4 mg q8h PRN -Patient has received 2x injections of Botox but reports migraines have been worse  SVT -History of ablation -Metoprolol tartrate 50 mg BID  Hyperlipidemia -Atorvastatin 40 mg daily -Patient has been out of atorvastatin since 11/2019 -Last lipid panel 08/01/2019 with HDL 35, LDL 51, TG 358  GERD -Dexlansoprazole 30 mg daily -Switched from omeprazole to current medication based on unavailability of omeprazole at last pharmacy. Patient reports she does not have a preference  Insomnia -Melatonin 10 mg at bedtime -Valerian tea if melatonin does not work  Vitamin B12 deficiency -Cyanocobalamin 1500 mcg every other day -Last B12 on 11/24/2017 was within normal limits  Medication adherence: -Patient endorses taking medications daily as prescribed -Taking samples for Creon, waiting on approval thru manufacturer for future supply -Requesting refills on dexlansoprazole, dicyclomine, ondansetron, atorvastatin  RTC 1 year for MTM  [place] Resident 10 January 2020

## 2020-01-12 ENCOUNTER — Other Ambulatory Visit: Payer: Self-pay

## 2020-01-12 ENCOUNTER — Ambulatory Visit: Payer: Self-pay

## 2020-01-12 DIAGNOSIS — M542 Cervicalgia: Secondary | ICD-10-CM

## 2020-01-12 DIAGNOSIS — R252 Cramp and spasm: Secondary | ICD-10-CM

## 2020-01-12 DIAGNOSIS — M25521 Pain in right elbow: Secondary | ICD-10-CM
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
## 2020-01-17 ENCOUNTER — Other Ambulatory Visit: Payer: Self-pay

## 2020-01-17 ENCOUNTER — Ambulatory Visit: Payer: Self-pay

## 2020-01-17 DIAGNOSIS — M25521 Pain in right elbow: Secondary | ICD-10-CM

## 2020-01-17 DIAGNOSIS — M542 Cervicalgia: Secondary | ICD-10-CM

## 2020-01-17 DIAGNOSIS — R252 Cramp and spasm: Secondary | ICD-10-CM
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
## 2020-01-19 ENCOUNTER — Other Ambulatory Visit: Payer: Self-pay

## 2020-01-19 ENCOUNTER — Ambulatory Visit: Payer: Self-pay

## 2020-01-19 DIAGNOSIS — G8929 Other chronic pain: Secondary | ICD-10-CM

## 2020-01-19 DIAGNOSIS — M542 Cervicalgia: Secondary | ICD-10-CM

## 2020-01-19 DIAGNOSIS — M5440 Lumbago with sciatica, unspecified side: Secondary | ICD-10-CM

## 2020-01-19 DIAGNOSIS — M25521 Pain in right elbow: Secondary | ICD-10-CM

## 2020-01-19 DIAGNOSIS — R252 Cramp and spasm: Secondary | ICD-10-CM
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
## 2020-01-20 ENCOUNTER — Encounter: Payer: Self-pay | Admitting: Family Medicine

## 2020-01-20 ENCOUNTER — Other Ambulatory Visit: Payer: Self-pay

## 2020-01-20 DIAGNOSIS — I10 Essential (primary) hypertension: Secondary | ICD-10-CM

## 2020-01-20 MED ORDER — METOPROLOL TARTRATE 50 MG PO TABS: 50.0000 mg | ORAL_TABLET | Freq: Two times a day (BID) | ORAL | 0 refills | Status: DC

## 2020-01-20 NOTE — Telephone Encounter (Signed)
1.  Yes she can get a refill for dicyclomine 90 days with 3 refills.  2.  Overall that she feels better on not if not feeling better the next up would be for colonoscopy and consider treatment with Xifaxan for irritable bowel syndrome with diarrhea.  She can think about it or go ahead with the plan or we can have an office visit to discuss about it or telephone visit

## 2020-01-24 ENCOUNTER — Other Ambulatory Visit: Payer: Self-pay

## 2020-01-24 ENCOUNTER — Other Ambulatory Visit: Payer: Self-pay | Admitting: Family Medicine

## 2020-01-24 ENCOUNTER — Ambulatory Visit: Payer: Self-pay

## 2020-01-24 ENCOUNTER — Ambulatory Visit (INDEPENDENT_AMBULATORY_CARE_PROVIDER_SITE_OTHER): Payer: Self-pay | Admitting: Family Medicine

## 2020-01-24 ENCOUNTER — Encounter: Payer: Self-pay | Admitting: Family Medicine

## 2020-01-24 VITALS — BP 126/70 | HR 93 | Temp 98.5°F | Resp 14 | Ht 63.0 in | Wt 223.0 lb

## 2020-01-24 DIAGNOSIS — F331 Major depressive disorder, recurrent, moderate: Secondary | ICD-10-CM

## 2020-01-24 DIAGNOSIS — Z6838 Body mass index (BMI) 38.0-38.9, adult: Secondary | ICD-10-CM

## 2020-01-24 DIAGNOSIS — G8929 Other chronic pain: Secondary | ICD-10-CM

## 2020-01-24 DIAGNOSIS — E538 Deficiency of other specified B group vitamins: Secondary | ICD-10-CM

## 2020-01-24 DIAGNOSIS — R252 Cramp and spasm: Secondary | ICD-10-CM

## 2020-01-24 DIAGNOSIS — M542 Cervicalgia: Secondary | ICD-10-CM

## 2020-01-24 DIAGNOSIS — I10 Essential (primary) hypertension: Secondary | ICD-10-CM

## 2020-01-24 DIAGNOSIS — K909 Intestinal malabsorption, unspecified: Secondary | ICD-10-CM

## 2020-01-24 DIAGNOSIS — I471 Supraventricular tachycardia: Secondary | ICD-10-CM

## 2020-01-24 DIAGNOSIS — R109 Unspecified abdominal pain: Secondary | ICD-10-CM

## 2020-01-24 DIAGNOSIS — E559 Vitamin D deficiency, unspecified: Secondary | ICD-10-CM

## 2020-01-24 DIAGNOSIS — R7303 Prediabetes: Secondary | ICD-10-CM

## 2020-01-24 DIAGNOSIS — M25521 Pain in right elbow: Secondary | ICD-10-CM

## 2020-01-24 DIAGNOSIS — E782 Mixed hyperlipidemia: Secondary | ICD-10-CM

## 2020-01-24 MED ORDER — MUPIROCIN 2 % EX OINT: 1.0000 "application " | TOPICAL_OINTMENT | Freq: Two times a day (BID) | CUTANEOUS | 0 refills | Status: DC | PRN

## 2020-01-24 MED ORDER — METOPROLOL TARTRATE 50 MG PO TABS: 50.0000 mg | ORAL_TABLET | Freq: Two times a day (BID) | ORAL | 3 refills | Status: DC

## 2020-01-24 MED ORDER — DICYCLOMINE HCL 10 MG PO CAPS: 10.0000 mg | ORAL_CAPSULE | Freq: Three times a day (TID) | ORAL | 2 refills | Status: DC | PRN

## 2020-01-24 MED ORDER — ATORVASTATIN CALCIUM 40 MG PO TABS: 40.0000 mg | ORAL_TABLET | Freq: Every day | ORAL | 3 refills | Status: DC

## 2020-01-24 MED ORDER — VITAMIN D (ERGOCALCIFEROL) 1.25 MG (50000 UNIT) PO CAPS: 50000.0000 [IU] | ORAL_CAPSULE | ORAL | 2 refills | Status: DC

## 2020-01-24 NOTE — Progress Notes (Signed)
Name: Dana Bradley   MRN: [id number]    DOB: 12-13-1970   Date:01/24/2020       Progress Note  Chief Complaint  Patient presents with  . Follow-up  . Medication Refill  . Hypertension  . Hyperlipidemia     Subjective:   Dana Bradley is a 49 y.o. female, presents to clinic for routine follow up on the conditions listed above.  Hyperlipidemia: Triglycerides are high off of her statin for at least 6-7 months, one year ago lipitor was prescribed with note of change from crestor Current Medication Regimen:  lipitor 40 mg  Last Lipids: Lab Results  Component Value Date   CHOL 158 08/01/2019   HDL 35 (L) 08/01/2019   LDLCALC 51 08/01/2019   TRIG 358 (H) 08/01/2019   CHOLHDL 4.5 08/01/2019  - Current Diet:  Veggies, trying to eat lean meats, avoid sugars and fake sugars - Denies: Chest pain, shortness of breath, myalgias. - Risk factors for atherosclerosis: hypercholesterolemia and hypertension  Hypertension:  Currently managed on metoprolol 50 mg BID - Hx of SVT sees Dana Bradley  Pt reports good med compliance and denies any SE.  No lightheadedness, hypotension, syncope. Blood pressure today is well controlled. BP Readings from Last 3 Encounters:  01/24/20 126/70  12/28/19 109/76  11/16/19 113/76  Pt denies CP, SOB, exertional sx, LE edema, palpitation, Ha's, visual disturbances Dietary efforts for BP? Trying to eat healthier   GI sx, ongoing for months and months now, seeing GI, but is waiting to get back in with Dr. Vicente Bradley for diarrhea.  Pt requests bentyl refill and switch to omeprazole from dexilant since she is having trouble getting dexilant   From Dr. Vicente Bradley they wanted to do trial of creon, due to cost/Charity care she has still not yet gotten this.       Patient Active Problem List   Diagnosis Date Noted  . Lumbar radiculitis (Right) 09/21/2019  . Chronic lower extremity pain (Bilateral) 09/06/2019  . Intractable migraine with aura without status migrainosus 08/10/2019   . Other specified dorsopathies, sacral and sacrococcygeal region 08/03/2019  . Latex precautions, history of latex allergy 08/03/2019  . History of allergy to radiographic contrast media 08/03/2019  . DDD (degenerative disc disease), cervical 07/21/2019  . Cervical facet syndrome (Bilateral) (L>R) 07/21/2019  . DDD (degenerative disc disease), thoracic 07/21/2019  . Osteoarthritis of hip (Left) 07/21/2019  . Chronic groin pain (Bilateral) (L>R) 07/21/2019  . Chronic hip pain (Bilateral) (L>R) 07/21/2019  . Somatic dysfunction of sacroiliac joint (Bilateral) 07/21/2019  . Migraine with aura and with status migrainosus, not intractable 04/06/2019  . Cervico-occipital neuralgia of left side 04/06/2019  . Weakness of leg (Left) 04/05/2019  . Difficulty walking 04/05/2019  . Chronic migraine without aura, with intractable migraine, so stated, with status migrainosus 12/27/2018  . Malar rash 09/04/2018  . Trigger point of neck (Left) 03/19/2018  . Occipital headache 12/25/2017  . Chronic fatigue syndrome with fibromyalgia 12/12/2017  . Trigger point of shoulder region (Left) 11/17/2017  . Myofascial pain syndrome (Left) (trapezius muscle) 07/22/2017  . Lumbar L1-2 disc protrusion (Right) 04/07/2017  . Muscle spasticity 04/01/2017  . Osteoarthritis of shoulder (Bilateral) 04/01/2017  . Lumbar spondylosis 01/06/2017  . Chronic hip pain (Left) 12/24/2016  . Chronic sacroiliac joint pain (Left) 12/24/2016  . Lumbar facet syndrome (Bilateral) (L>R) 12/24/2016  . Lumbar radiculitis (Left) 12/24/2016  . Hypertriglyceridemia 11/27/2016  . History of vasovagal episode 10/30/2016  . Cervicogenic headache 09/09/2016  . Medication monitoring encounter  08/29/2016  . Controlled substance agreement signed 08/28/2016  . Plantar fasciitis of left foot 08/28/2016  . Vitamin B12 deficiency 08/28/2016  . Hyperlipidemia 08/28/2016  . Nephrolithiasis 08/12/2016  . Chronic pain syndrome 08/07/2016  .  Long term prescription opiate use 08/07/2016  . Opiate use 08/07/2016  . Long term prescription benzodiazepine use 08/07/2016  . Neurogenic pain 08/07/2016  . Chronic low back pain (Primary Area of Pain) (Bilateral) (R>L) (midline) 08/07/2016  . Chronic upper back pain (Secondary area of Pain) (Bilateral) (L>R) 08/07/2016  . Chronic abdominal pain (Right lower quadrant) 08/07/2016  . Thoracic radiculitis (Bilateral: T10, T11) 08/07/2016  . Chronic occipital neuralgia (Third area of Pain) (Bilateral) (L>R) 08/07/2016  . Chronic neck pain 08/07/2016  . Chronic cervical radicular pain (Bilateral) (L>R) 08/07/2016  . Chronic shoulder blade pain (Bilateral) (L>R) 08/07/2016  . Chronic upper extremity pain (Bilateral) (R>L) 08/07/2016  . Chronic knee pain (Bilateral) (R>L) 08/07/2016  . Chronic ankle pain (Bilateral) 08/07/2016  . Cervical spondylosis with myelopathy and radiculopathy 08/07/2016  . Panic disorder with agoraphobia 05/29/2016  . Depression, unspecified depression type 05/29/2016  . Atypical lymphocytosis 05/01/2016  . Vitamin D insufficiency 05/01/2016  . Chronic lower extremity cramps (Bilateral) (R>L) 04/29/2016  . Obesity 04/29/2016  . GAD (generalized anxiety disorder) 04/29/2016  . Fatigue 04/29/2016  . Insomnia 07/12/2015  . Migraine without aura and with status migrainosus, not intractable 07/12/2015  . Chronic superficial gastritis 06/02/2015  . Chronic pain of multiple joints 05/15/2015  . Bilateral leg edema 05/02/2015  . Paroxysmal supraventricular tachycardia ([place]) 04/17/2015  . Exertional shortness of breath 04/17/2015  . Bright red rectal bleeding 04/06/2015  . DDD (degenerative disc disease), lumbosacral 01/24/2014  . DDD (degenerative disc disease), lumbar 01/24/2014  . Cervico-occipital neuralgia 12/29/2013  . Fibromyalgia 12/29/2013  . Migraine headache 12/29/2013  . Menorrhagia 12/10/2012  . Depression, major, recurrent, in remission ([place]) 01/12/2009   . Chest pain 01/12/2009  . Hypertension, benign essential, goal below 140/90 06/23/2008  . History of PSVT (paroxysmal supraventricular tachycardia) 06/17/2008  . Obstructive sleep apnea, adult 06/17/2008  . GERD 06/13/2008    Past Surgical History:  Procedure Laterality Date  . ABLATION     Uterine  . CARDIAC CATHETERIZATION     with ablation  . COLONOSCOPY WITH PROPOFOL N/A 05/17/2015   Procedure: COLONOSCOPY WITH PROPOFOL;  Surgeon: Manya Silvas, MD;  Location[facility];  Service: Endoscopy;  Laterality: N/A;  . ESOPHAGOGASTRODUODENOSCOPY N/A 05/17/2015   Procedure: ESOPHAGOGASTRODUODENOSCOPY (EGD);  Surgeon: Manya Silvas, MD;  Location: [facility] ENDOSCOPY;  Service: Endoscopy;  Laterality: N/A;  . KNEE ARTHROSCOPY    . spg     6/18  . Tibial Tubercle Bypass Right 1998  . TUBAL LIGATION  10/01/99    Family History  Problem Relation Age of Onset  . Depression Mother   . Hypertension Mother   . Cancer Mother        Skin  . Hyperlipidemia Mother   . Anxiety disorder Mother   . Migraines Mother   . Alcohol abuse Father   . Depression Father   . Stroke Father   . Heart disease Father   . Hypertension Father   . Anxiety disorder Father   . Depression Sister   . Hyperlipidemia Sister   . Diabetes Sister   . Hypertension Sister   . Polycystic ovary syndrome Sister   . Bipolar disorder Sister   . Anxiety disorder Sister   . Migraines Sister   . Cancer Maternal  Grandmother 11       Breast  . Thyroid disease Maternal Grandmother   . Arthritis Maternal Grandmother   . Hyperlipidemia Maternal Grandmother   . Depression Sister   . Hypertension Sister   . Anxiety disorder Sister   . Migraines Sister   . Alzheimer's disease Other   . Aneurysm Maternal Grandfather   . Hypertension Maternal Grandfather   . Heart disease Maternal Grandfather   . Alzheimer's disease Paternal Grandmother   . Heart attack Paternal Grandfather   . Hypertension Paternal Grandfather    . COPD Paternal Grandfather   . Heart disease Paternal Grandfather   . Migraines Son   . Migraines Daughter   . Migraines Daughter   . Bladder Cancer Neg Hx   . Kidney cancer Neg Hx     Social History   Tobacco Use  . Smoking status: Former Smoker    Packs/day: 4.00    Years: 3.00    Pack years: 12.00    Types: Cigarettes    Quit date: 12/10/1992    Years since quitting: 27.1  . Smokeless tobacco: Never Used  . Tobacco comment: quit 25 years ago  Substance Use Topics  . Alcohol use: No    Alcohol/week: 0.0 standard drinks  . Drug use: No      Current Outpatient Medications:  .  acetaminophen (TYLENOL) 500 MG tablet, Take 500-1,000 mg by mouth as needed., Disp: , Rfl:  .  atorvastatin (LIPITOR) 40 MG tablet, Take 1 tablet (40 mg total) by mouth at bedtime. This replaces crestor (rosuvastatin), Disp: 90 tablet, Rfl: 1 .  baclofen (LIORESAL) 10 MG tablet, Take 1 tablet (10 mg total) by mouth 3 (three) times daily. (Patient taking differently: Take 10 mg by mouth 3 (three) times daily. As needed.), Disp: 90 tablet, Rfl: 5 .  butalbital-acetaminophen-caffeine (FIORICET) 50-325-40 MG tablet, Take 1-2 tablets by mouth every 6 (six) hours as needed for headache., Disp: 20 tablet, Rfl: 0 .  Cyanocobalamin (VITAMIN B-12) 500 MCG SUBL, Place 1,500 mcg under the tongue every other day. , Disp: 150 tablet, Rfl:  .  Dexlansoprazole (DEXILANT) 30 MG capsule, Take 1 capsule (30 mg total) by mouth daily. This replaces omeprazole, Disp: 30 capsule, Rfl: 2 .  diclofenac sodium (VOLTAREN) 1 % GEL, Apply 2 g topically 4 (four) times daily. (Patient taking differently: Apply 2 g topically 4 (four) times daily. As needed.), Disp: 350 g, Rfl: 3 .  dicyclomine (BENTYL) 10 MG capsule, Take 1 capsule (10 mg total) by mouth 4 (four) times daily -  before meals and at bedtime. (Patient taking differently: Take 10 mg by mouth 4 (four) times daily -  before meals and at bedtime. As needed.), Disp: 90 capsule,  Rfl: 1 .  HYDROcodone-acetaminophen (NORCO) 7.5-325 MG tablet, Take 1 tablet by mouth every 6 (six) hours as needed for severe pain. Must last 90 days, Disp: 30 tablet, Rfl: 0 .  lipase/protease/amylase (CREON) 36000 UNITS CPEP capsule, Take 2 caps with each meal and 1 cap with each snack, Disp: 240 capsule, Rfl: 5 .  magnesium oxide (MAG-OX) 400 MG tablet, Take 300 mg by mouth daily. , Disp: , Rfl:  .  Melatonin 10 MG TABS, Take 10 mg by mouth at bedtime., Disp: , Rfl:  .  metoprolol tartrate (LOPRESSOR) 50 MG tablet, Take 1 tablet (50 mg total) by mouth 2 (two) times daily., Disp: 60 tablet, Rfl: 0 .  ondansetron (ZOFRAN) 4 MG tablet, Take 1 tablet (4 mg total)  by mouth every 8 (eight) hours as needed., Disp: 20 tablet, Rfl: 2 .  pregabalin (LYRICA) 150 MG capsule, Take 1 capsule (150 mg total) by mouth every 8 (eight) hours., Disp: 270 capsule, Rfl: 1 .  VALERIAN PO, Take by mouth as needed. Makes Valerian tea about 3-4 times per week., Disp: , Rfl:  .  HYDROcodone-acetaminophen (NORCO) 7.5-325 MG tablet, Take 1 tablet by mouth every 6 (six) hours as needed for severe pain. Must last 90 days, Disp: 30 tablet, Rfl: 0 .  HYDROcodone-acetaminophen (NORCO) 7.5-325 MG tablet, Take 1 tablet by mouth every 6 (six) hours as needed for severe pain. Must last 90 days, Disp: 30 tablet, Rfl: 0  Allergies  Allergen Reactions  . Aspirin Swelling  . Cymbalta [Duloxetine Hcl] Other (See Comments)    Suicidal ideations and has homicidal thoughts per patient  . Depakote [Divalproex Sodium] Shortness Of Breath    W/ n/v  . Gadolinium Derivatives     Pt was unable to breath  . Haloperidol Shortness Of Breath    W/ n/v  . Meperidine Nausea And Vomiting    Patient projectile vomits and usually result in ER  . Reglan [Metoclopramide] Shortness Of Breath    Can't breath, wheezes  . Tramadol Hcl Palpitations    Severely and adversely affects her SVT giving her tachycardias of 150-160 bpm.  . Trazodone  Shortness Of Breath  . Compazine [Prochlorperazine] Other (See Comments)    Panic attack  . Meloxicam Other (See Comments)    mouth sores, tingling, blisters in mouth  . Penicillins Rash  . Tomato Hives    Tongue will blister  . Other   . Shellfish Allergy     Other reaction(s): Unknown  . Shellfish-Derived Products Other (See Comments)  . Bacitracin-Neomycin-Polymyxin Rash  . Cephalosporins Rash    rash  . Ibuprofen Other (See Comments) and Rash    Blisters in mouth. Blisters in mouth.  . Latex Itching  . Neosporin [Neomycin-Bacitracin Zn-Polymyx] Rash  . Nsaids Other (See Comments)    Blisters in mouth; can take 1 ibuprofen 2x a month  . Sulfa Antibiotics Rash    rash Other reaction(s): Unknown rash   . Sulfonamide Derivatives Rash    Chart Review Today: I personally reviewed active problem list, medication list, allergies, family history, social history, health maintenance, notes from last encounter, lab results, imaging with the patient/caregiver today.   Review of Systems  10 Systems reviewed and are negative for acute change except as noted in the HPI.  Objective:    Vitals:   01/24/20 1136  BP: 126/70  Pulse: 93  Resp: 14  Temp: 98.5 F (36.9 C)  SpO2: 96%  Weight: 223 lb (101.2 kg)  Height: 5\' 3"  (1.6 m)    Body mass index is 39.5 kg/m.  Physical Exam Vitals and nursing note reviewed.  Constitutional:      General: She is not in acute distress.    Appearance: Normal appearance. She is obese. She is not ill-appearing, toxic-appearing or diaphoretic.  HENT:     Head: Normocephalic and atraumatic.     Right Ear: External ear normal.     Left Ear: External ear normal.  Eyes:     General: No scleral icterus.       Right eye: No discharge.        Left eye: No discharge.     Conjunctiva/sclera: Conjunctivae normal.  Cardiovascular:     Rate and Rhythm: Normal rate and regular rhythm.  Pulses: Normal pulses.     Heart sounds: Normal heart  sounds.  Pulmonary:     Effort: Pulmonary effort is normal.     Breath sounds: Normal breath sounds.  Abdominal:     General: Bowel sounds are normal. There is no distension.     Palpations: Abdomen is soft.     Tenderness: There is no abdominal tenderness.  Neurological:     Mental Status: She is alert.  Psychiatric:        Mood and Affect: Mood normal.        Behavior: Behavior normal.        PHQ2/9: Depression screen [facility] 2/9 01/24/2020 09/28/2019 09/21/2019 07/30/2019 07/26/2019  Decreased Interest 0 0 1 1 1   Down, Depressed, Hopeless 0 0 1 0 1  PHQ - 2 Score 0 0 2 1 2   Altered sleeping 0 - 1 2 1   Tired, decreased energy 0 - 1 3 1   Change in appetite 0 - 1 1 1   Feeling bad or failure about yourself  0 - 1 1 1   Trouble concentrating 0 - 1 1 1   Moving slowly or fidgety/restless 0 - 1 1 1   Suicidal thoughts 0 - 0 0 0  PHQ-9 Score 0 - 8 10 8   Difficult doing work/chores Not difficult at all - - Somewhat difficult Somewhat difficult  Some recent data might be hidden    phq 9 is neg, reviewed  Fall Risk: Fall Risk  01/24/2020 12/28/2019 09/28/2019 09/21/2019 09/07/2019  Falls in the past year? 1 1 1  0 1  Comment - - 3 in the last week, states leg gives out - -  Number falls in past yr: 1 1 1  0 1  Injury with Fall? 0 (No Data) 0 0 0  Comment - bruises - - -  Risk Factor Category  - - - - -  Comment - - - - -  Risk for fall due to : History of fall(s);Impaired balance/gait;Impaired mobility - - - -  Risk for fall due to: Comment - - - - -  Follow up - - - Falls evaluation completed -    Functional Status Survey: Is the patient deaf or have difficulty hearing?: No Does the patient have difficulty seeing, even when wearing glasses/contacts?: Yes Does the patient have difficulty concentrating, remembering, or making decisions?: No Does the patient have difficulty walking or climbing stairs?: Yes Does the patient have difficulty dressing or bathing?: Yes Does the patient  have difficulty doing errands alone such as visiting a doctor's office or shopping?: No   Assessment & Plan:   1. Mixed hyperlipidemia Compliant with statin, no current myalgias, working on diet, due for recheck of labs which she requested be ordered so she can do at the hospital - atorvastatin (LIPITOR) 40 MG tablet; Take 1 tablet (40 mg total) by mouth at bedtime.  Dispense: 90 tablet; Refill: 3 - Lipid panel; Future - Comprehensive metabolic panel; Future  2. Hypertension, benign essential, goal below 140/90 Stable, well-controlled blood pressure at goal today - metoprolol tartrate (LOPRESSOR) 50 MG tablet; Take 1 tablet (50 mg total) by mouth 2 (two) times daily.  Dispense: 180 tablet; Refill: 3 - Comprehensive metabolic panel; Future  3. Abdominal cramping Pt requests refill of bentyl -I explained to her that Bentyl is not usually used for several months usually limited to about 3 months and patient should follow-up with her GI doctor and does need to work on getting his last treatment/meds and  trying it so she can begin feeling better - financial barriers - pt working with [facility] care to get the medications - dicyclomine (BENTYL) 10 MG capsule; Take 1 capsule (10 mg total) by mouth 3 (three) times daily as needed. As needed.  Dispense: 90 capsule; Refill: 2 - Comprehensive metabolic panel; Future  4. Prediabetes - Hemoglobin A1c; Future - Comprehensive metabolic panel; Future  5. Vitamin D deficiency Hx of deficiency, last labs Vit D was 12.48, refill on Rx strength Vit D, will likely continue to require Rx strength due to malabsorption - Vitamin D, Ergocalciferol, (DRISDOL) 1.25 MG (50000 UNIT) CAPS capsule; Take 1 capsule (50,000 Units total) by mouth every 7 (seven) days. x12 weeks.  Dispense: 12 capsule; Refill: 2 - VITAMIN D 25 Hydroxy (Vit-D Deficiency, Fractures); Future  6. Vitamin B12 deficiency Recheck B12, last checked in 2019, diarrhea and GI sx much worse - Vitamin  B12; Future - CBC with Differential/Platelet; Future  7. Intestinal malabsorption, unspecified type Frequent diarrhea, working with GI, hx of deficiencies likely due to malabsorption, pt does want to recheck labs - Vitamin D, Ergocalciferol, (DRISDOL) 1.25 MG (50000 UNIT) CAPS capsule; Take 1 capsule (50,000 Units total) by mouth every 7 (seven) days. x12 weeks.  Dispense: 12 capsule; Refill: 2 - Vitamin B12; Future - CBC with Differential/Platelet; Future - Comprehensive metabolic panel; Future  8. MDD (major depressive disorder), recurrent episode, moderate ([place]) Chronic     well controlled MDD - neg phq screening, pt doing well  9. Paroxysmal supraventricular tachycardia (HCC) Chronic     Symptoms intermittent, well controlled with metoprolol, she does see Dr. Rockey Situ for management  10. Class 2 severe obesity with serious comorbidity and body mass index (BMI) of 38.0 to 38.9 in adult, unspecified obesity type (HCC) Chronic         HTN, HLD, prediabetes comorbidities - healthy diet encouraged     Return for 6 month routine f/up.   Delsa Grana, PA-C 01/24/20 12:00 PM

## 2020-01-24 NOTE — Patient Instructions (Signed)
See handout on glycemic index diet - but sounds like your priority now is to work with GI to get a diet regimine that helps your GI system calm down.  Take the statin and try to avoid trans fat and saturated fat.    We will recheck you labs in 3-6 months to see how the statin is working for you.

## 2020-01-25 ENCOUNTER — Encounter: Payer: Self-pay | Admitting: Allergy & Immunology

## 2020-01-25 ENCOUNTER — Ambulatory Visit (INDEPENDENT_AMBULATORY_CARE_PROVIDER_SITE_OTHER): Payer: Self-pay | Admitting: Allergy & Immunology

## 2020-01-25 ENCOUNTER — Ambulatory Visit: Payer: Medicaid Other | Attending: Internal Medicine

## 2020-01-25 VITALS — BP 118/84 | HR 84 | Temp 98.3°F | Resp 18 | Ht 63.0 in | Wt 223.0 lb

## 2020-01-25 DIAGNOSIS — Z889 Allergy status to unspecified drugs, medicaments and biological substances status: Secondary | ICD-10-CM

## 2020-01-25 DIAGNOSIS — R197 Diarrhea, unspecified: Secondary | ICD-10-CM

## 2020-01-25 DIAGNOSIS — Z23 Encounter for immunization: Secondary | ICD-10-CM

## 2020-01-25 DIAGNOSIS — J310 Chronic rhinitis: Secondary | ICD-10-CM

## 2020-01-25 NOTE — Progress Notes (Signed)
   Covid-19 Vaccination Clinic  Name:  Dana Bradley    MRN: [id number] DOB: [birth year]  01/25/2020  [name] was observed post Covid-19 immunization for 15 minutes without incident. She was provided with Vaccine Information Sheet and instruction to access the V-Safe system.   [name] was instructed to call 911 with any severe reactions post vaccine: Marland Kitchen Difficulty breathing  . Swelling of face and throat  . A fast heartbeat  . A bad rash all over body  . Dizziness and weakness   Immunizations Administered    Name Date Dose VIS Date Route   Moderna COVID-19 Vaccine 01/25/2020 12:24 PM 0.5 mL 08/2019 Intramuscular   Manufacturer: Moderna   Lot: [id number]   [id number]

## 2020-01-25 NOTE — Progress Notes (Signed)
NEW PATIENT  Date of Service/Encounter:  01/25/20  Referring provider: Delsa Grana, PA-C   Assessment:   Chronic rhinitis - with negative environmental testing today  Diarrhea - with negative food testing today  Patient concern for mast cell disease - obtaining serum tryptase today  Multiple drug allergies  Complex medical history, including fibromyalgia, depression, and hypercholesterolemia, SVT, and chronic back pain, amongst other diagnoses  Plan/Recommendations:   1. Migraines with diarrhea and chronic rhinitis - Testing was negative to the entire environmental panel as well as the food panel. - Histamine (positive control) was excellent, which means we can believe the testing today. - We are going to get a serum tryptase today to look for mast cell disease. - This might be normal today (and likely will be). - BUT I want to get another one when you have a particularly severe reaction.   2. Return in about 6 months (around 07/26/2020). This can be an in-person, a virtual Webex or a telephone follow up visit.   Subjective:   Larken Urias is a 49 y.o. female presenting today for evaluation of  Chief Complaint  Patient presents with  . Food Intolerance    Acidic foods causing blisters, shellfish   . Migraine  . Allergic Rhinitis     Molds, cats and dogs     Anani Gu has a history of the following: Patient Active Problem List   Diagnosis Date Noted  . Lumbar radiculitis (Right) 09/21/2019  . Chronic lower extremity pain (Bilateral) 09/06/2019  . Intractable migraine with aura without status migrainosus 08/10/2019  . Other specified dorsopathies, sacral and sacrococcygeal region 08/03/2019  . Latex precautions, history of latex allergy 08/03/2019  . History of allergy to radiographic contrast media 08/03/2019  . DDD (degenerative disc disease), cervical 07/21/2019  . Cervical facet syndrome (Bilateral) (L>R) 07/21/2019  . DDD (degenerative disc disease),  thoracic 07/21/2019  . Osteoarthritis of hip (Left) 07/21/2019  . Chronic groin pain (Bilateral) (L>R) 07/21/2019  . Chronic hip pain (Bilateral) (L>R) 07/21/2019  . Somatic dysfunction of sacroiliac joint (Bilateral) 07/21/2019  . Migraine with aura and with status migrainosus, not intractable 04/06/2019  . Cervico-occipital neuralgia of left side 04/06/2019  . Weakness of leg (Left) 04/05/2019  . Difficulty walking 04/05/2019  . Chronic migraine without aura, with intractable migraine, so stated, with status migrainosus 12/27/2018  . Malar rash 09/04/2018  . Trigger point of neck (Left) 03/19/2018  . Occipital headache 12/25/2017  . Chronic fatigue syndrome with fibromyalgia 12/12/2017  . Trigger point of shoulder region (Left) 11/17/2017  . Myofascial pain syndrome (Left) (trapezius muscle) 07/22/2017  . Lumbar L1-2 disc protrusion (Right) 04/07/2017  . Muscle spasticity 04/01/2017  . Osteoarthritis of shoulder (Bilateral) 04/01/2017  . Lumbar spondylosis 01/06/2017  . Chronic hip pain (Left) 12/24/2016  . Chronic sacroiliac joint pain (Left) 12/24/2016  . Lumbar facet syndrome (Bilateral) (L>R) 12/24/2016  . Lumbar radiculitis (Left) 12/24/2016  . Hypertriglyceridemia 11/27/2016  . History of vasovagal episode 10/30/2016  . Cervicogenic headache 09/09/2016  . Medication monitoring encounter 08/29/2016  . Controlled substance agreement signed 08/28/2016  . Plantar fasciitis of left foot 08/28/2016  . Vitamin B12 deficiency 08/28/2016  . Hyperlipidemia 08/28/2016  . Nephrolithiasis 08/12/2016  . Chronic pain syndrome 08/07/2016  . Long term prescription opiate use 08/07/2016  . Opiate use 08/07/2016  . Long term prescription benzodiazepine use 08/07/2016  . Neurogenic pain 08/07/2016  . Chronic low back pain (Primary Area of Pain) (Bilateral) (R>L) (midline) 08/07/2016  .  Chronic upper back pain (Secondary area of Pain) (Bilateral) (L>R) 08/07/2016  . Chronic abdominal pain  (Right lower quadrant) 08/07/2016  . Thoracic radiculitis (Bilateral: T10, T11) 08/07/2016  . Chronic occipital neuralgia (Third area of Pain) (Bilateral) (L>R) 08/07/2016  . Chronic neck pain 08/07/2016  . Chronic cervical radicular pain (Bilateral) (L>R) 08/07/2016  . Chronic shoulder blade pain (Bilateral) (L>R) 08/07/2016  . Chronic upper extremity pain (Bilateral) (R>L) 08/07/2016  . Chronic knee pain (Bilateral) (R>L) 08/07/2016  . Chronic ankle pain (Bilateral) 08/07/2016  . Cervical spondylosis with myelopathy and radiculopathy 08/07/2016  . Panic disorder with agoraphobia 05/29/2016  . Depression, unspecified depression type 05/29/2016  . Atypical lymphocytosis 05/01/2016  . Vitamin D insufficiency 05/01/2016  . Chronic lower extremity cramps (Bilateral) (R>L) 04/29/2016  . Obesity 04/29/2016  . GAD (generalized anxiety disorder) 04/29/2016  . Fatigue 04/29/2016  . Insomnia 07/12/2015  . Migraine without aura and with status migrainosus, not intractable 07/12/2015  . Chronic superficial gastritis 06/02/2015  . Chronic pain of multiple joints 05/15/2015  . Bilateral leg edema 05/02/2015  . Paroxysmal supraventricular tachycardia ([place]) 04/17/2015  . Exertional shortness of breath 04/17/2015  . Bright red rectal bleeding 04/06/2015  . DDD (degenerative disc disease), lumbosacral 01/24/2014  . DDD (degenerative disc disease), lumbar 01/24/2014  . Cervico-occipital neuralgia 12/29/2013  . Fibromyalgia 12/29/2013  . Migraine headache 12/29/2013  . Menorrhagia 12/10/2012  . Depression, major, recurrent, in remission ([place]) 01/12/2009  . Chest pain 01/12/2009  . Hypertension, benign essential, goal below 140/90 06/23/2008  . History of PSVT (paroxysmal supraventricular tachycardia) 06/17/2008  . Obstructive sleep apnea, adult 06/17/2008  . GERD 06/13/2008    History obtained from: chart review and patient and her husband.   Makaelah Cranfield was referred by Delsa Grana, PA-C.      Muntaha is a 49 y.o. female presenting for an evaluation of a multitude of complaints, but mostly focused on migraines as well as episodes of diarrhea.    She has a history of migraines. She has an app on her phone to keep up with them and shows me that she has essentially had only 4-5 days in a row without a migraine and her migraine days most definitively outweigh her migraine-free days. She has had them since high school. She and her husband, who is here today, have known each other since age 65. Her husband confirms that these migraines have been a problem consistently. Migraines have gotten worse since 2008. Her husband reports that she has been told that she is "distinctly strange" by a few doctors. She has tried a number of migraine medications, including baclofen, metoprolol, zofran, trileptal, lyrica, sumatriptan, elavil, ajovy, Topamax, amitriptyline, and nortriptyline. There was discussion of starting botox injections at her visit with [facility] in February 2021. There was also discussion of sending her to an academic headache clinic for gamma knife treatments.   She does have a diagnosis of fibromyalgia. This has been treated with various antidepressants including Cymbalta and Lyrica. Cymbalta made her depression worse, so this one did not last long. She has had autoimmune labs which have been normal, including an ANA, CRP, ESR, and CBC.   She has an allergy to multiple medications as well as foods. She is thinking that there is a concern for mast cell syndrome. She is unsure of the trigger of her migraines. They wake her up the in night. She has a history of "gut issues" with pain and diarrhea. She did have a scope several years  ago and her workup was normal. She reports that she can eat certain things including fruits and vegetables. She will have some mouth blistering and peeling of her tongue/bleeding. She has these triggers that seem to change over time. She has never  had food testing at all aside from Celiac disease, which was negative.   She does have allergies to cats, trees, dogs, and grass. She has breathing issues from these pollens and whatnot. She has tried antihistamines when she cannot breathe well or when her eyes do not stop watering. She takes antihistamines a few times per week but has never used them on a daily basis. She does not use nose sprays. Migraines are not isolated to a particular time of the year. She [place] in the [place] area and has lived her for her entire life.   She has no history of asthma. She has a lot of drug allergies. These allergies started around age 47 years of age. She got penicillin, which resulted in a widespread rash. The other medications quickly followed.   She has a history of tachycardia and had an ablation in 2004. She has few infections per year. She estimates that she gets antibiotics around 1-2 times per year at the most. Apparently once she had a raging case of cellulitis on her lower extremity (treated outpatient without any IV antibiotics). Otherwise these are sinus infections or ear infections.   Otherwise, there is no history of other atopic diseases, including drug allergies, stinging insect allergies, eczema, urticaria or contact dermatitis. There is no significant infectious history. Vaccinations are up to date.    Past Medical History: Patient Active Problem List   Diagnosis Date Noted  . Lumbar radiculitis (Right) 09/21/2019  . Chronic lower extremity pain (Bilateral) 09/06/2019  . Intractable migraine with aura without status migrainosus 08/10/2019  . Other specified dorsopathies, sacral and sacrococcygeal region 08/03/2019  . Latex precautions, history of latex allergy 08/03/2019  . History of allergy to radiographic contrast media 08/03/2019  . DDD (degenerative disc disease), cervical 07/21/2019  . Cervical facet syndrome (Bilateral) (L>R) 07/21/2019  . DDD (degenerative disc disease),  thoracic 07/21/2019  . Osteoarthritis of hip (Left) 07/21/2019  . Chronic groin pain (Bilateral) (L>R) 07/21/2019  . Chronic hip pain (Bilateral) (L>R) 07/21/2019  . Somatic dysfunction of sacroiliac joint (Bilateral) 07/21/2019  . Migraine with aura and with status migrainosus, not intractable 04/06/2019  . Cervico-occipital neuralgia of left side 04/06/2019  . Weakness of leg (Left) 04/05/2019  . Difficulty walking 04/05/2019  . Chronic migraine without aura, with intractable migraine, so stated, with status migrainosus 12/27/2018  . Malar rash 09/04/2018  . Trigger point of neck (Left) 03/19/2018  . Occipital headache 12/25/2017  . Chronic fatigue syndrome with fibromyalgia 12/12/2017  . Trigger point of shoulder region (Left) 11/17/2017  . Myofascial pain syndrome (Left) (trapezius muscle) 07/22/2017  . Lumbar L1-2 disc protrusion (Right) 04/07/2017  . Muscle spasticity 04/01/2017  . Osteoarthritis of shoulder (Bilateral) 04/01/2017  . Lumbar spondylosis 01/06/2017  . Chronic hip pain (Left) 12/24/2016  . Chronic sacroiliac joint pain (Left) 12/24/2016  . Lumbar facet syndrome (Bilateral) (L>R) 12/24/2016  . Lumbar radiculitis (Left) 12/24/2016  . Hypertriglyceridemia 11/27/2016  . History of vasovagal episode 10/30/2016  . Cervicogenic headache 09/09/2016  . Medication monitoring encounter 08/29/2016  . Controlled substance agreement signed 08/28/2016  . Plantar fasciitis of left foot 08/28/2016  . Vitamin B12 deficiency 08/28/2016  . Hyperlipidemia 08/28/2016  . Nephrolithiasis 08/12/2016  . Chronic pain syndrome  08/07/2016  . Long term prescription opiate use 08/07/2016  . Opiate use 08/07/2016  . Long term prescription benzodiazepine use 08/07/2016  . Neurogenic pain 08/07/2016  . Chronic low back pain (Primary Area of Pain) (Bilateral) (R>L) (midline) 08/07/2016  . Chronic upper back pain (Secondary area of Pain) (Bilateral) (L>R) 08/07/2016  . Chronic abdominal pain  (Right lower quadrant) 08/07/2016  . Thoracic radiculitis (Bilateral: T10, T11) 08/07/2016  . Chronic occipital neuralgia (Third area of Pain) (Bilateral) (L>R) 08/07/2016  . Chronic neck pain 08/07/2016  . Chronic cervical radicular pain (Bilateral) (L>R) 08/07/2016  . Chronic shoulder blade pain (Bilateral) (L>R) 08/07/2016  . Chronic upper extremity pain (Bilateral) (R>L) 08/07/2016  . Chronic knee pain (Bilateral) (R>L) 08/07/2016  . Chronic ankle pain (Bilateral) 08/07/2016  . Cervical spondylosis with myelopathy and radiculopathy 08/07/2016  . Panic disorder with agoraphobia 05/29/2016  . Depression, unspecified depression type 05/29/2016  . Atypical lymphocytosis 05/01/2016  . Vitamin D insufficiency 05/01/2016  . Chronic lower extremity cramps (Bilateral) (R>L) 04/29/2016  . Obesity 04/29/2016  . GAD (generalized anxiety disorder) 04/29/2016  . Fatigue 04/29/2016  . Insomnia 07/12/2015  . Migraine without aura and with status migrainosus, not intractable 07/12/2015  . Chronic superficial gastritis 06/02/2015  . Chronic pain of multiple joints 05/15/2015  . Bilateral leg edema 05/02/2015  . Paroxysmal supraventricular tachycardia ([place]) 04/17/2015  . Exertional shortness of breath 04/17/2015  . Bright red rectal bleeding 04/06/2015  . DDD (degenerative disc disease), lumbosacral 01/24/2014  . DDD (degenerative disc disease), lumbar 01/24/2014  . Cervico-occipital neuralgia 12/29/2013  . Fibromyalgia 12/29/2013  . Migraine headache 12/29/2013  . Menorrhagia 12/10/2012  . Depression, major, recurrent, in remission ([place]) 01/12/2009  . Chest pain 01/12/2009  . Hypertension, benign essential, goal below 140/90 06/23/2008  . History of PSVT (paroxysmal supraventricular tachycardia) 06/17/2008  . Obstructive sleep apnea, adult 06/17/2008  . GERD 06/13/2008    Medication List:  Allergies as of 01/25/2020      Reactions   Aspirin Swelling   Cymbalta [duloxetine Hcl] Other (See  Comments)   Suicidal ideations and has homicidal thoughts per patient   Depakote [divalproex Sodium] Shortness Of Breath   W/ n/v   Gadolinium Derivatives    Pt was unable to breath   Haloperidol Shortness Of Breath   W/ n/v   Meperidine Nausea And Vomiting   Patient projectile vomits and usually result in ER   Reglan [metoclopramide] Shortness Of Breath   Can't breath, wheezes   Tramadol Hcl Palpitations   Severely and adversely affects her SVT giving her tachycardias of 150-160 bpm.   Trazodone Shortness Of Breath   Compazine [prochlorperazine] Other (See Comments)   Panic attack   Meloxicam Other (See Comments)   mouth sores, tingling, blisters in mouth   Penicillins Rash   Tomato Hives   Tongue will blister   Other    Shellfish Allergy    Other reaction(s): Unknown   Shellfish-derived Products Other (See Comments)   Bacitracin-neomycin-polymyxin Rash   Cephalosporins Rash   rash   Ibuprofen Other (See Comments), Rash   Blisters in mouth. Blisters in mouth.   Latex Itching   Neosporin [neomycin-bacitracin Zn-polymyx] Rash   Nsaids Other (See Comments)   Blisters in mouth; can take 1 ibuprofen 2x a month   Sulfa Antibiotics Rash   rash Other reaction(s): Unknown rash   Sulfonamide Derivatives Rash      Medication List       Accurate as of January 25, 2020 11:59  PM. If you have any questions, ask your nurse or doctor.        acetaminophen 500 MG tablet Commonly known as: TYLENOL Take 500-1,000 mg by mouth as needed.   atorvastatin 40 MG tablet Commonly known as: LIPITOR Take 1 tablet (40 mg total) by mouth at bedtime.   baclofen 10 MG tablet Commonly known as: LIORESAL Take 1 tablet (10 mg total) by mouth 3 (three) times daily. What changed: additional instructions   butalbital-acetaminophen-caffeine 50-325-40 MG tablet Commonly known as: FIORICET Take 1-2 tablets by mouth every 6 (six) hours as needed for headache.   Dexilant 30 MG capsule Generic  drug: Dexlansoprazole Take 1 capsule (30 mg total) by mouth daily. This replaces omeprazole   diclofenac sodium 1 % Gel Commonly known as: VOLTAREN Apply 2 g topically 4 (four) times daily. What changed: additional instructions   dicyclomine 10 MG capsule Commonly known as: Bentyl Take 1 capsule (10 mg total) by mouth 3 (three) times daily as needed. As needed.   HYDROcodone-acetaminophen 7.5-325 MG tablet Commonly known as: Norco Take 1 tablet by mouth every 6 (six) hours as needed for severe pain. Must last 90 days   HYDROcodone-acetaminophen 7.5-325 MG tablet Commonly known as: Norco Take 1 tablet by mouth every 6 (six) hours as needed for severe pain. Must last 90 days   HYDROcodone-acetaminophen 7.5-325 MG tablet Commonly known as: Norco Take 1 tablet by mouth every 6 (six) hours as needed for severe pain. Must last 90 days   lipase/protease/amylase 36000 UNITS Cpep capsule Commonly known as: Creon Take 2 caps with each meal and 1 cap with each snack   magnesium oxide 400 MG tablet Commonly known as: MAG-OX Take 300 mg by mouth daily.   Melatonin 10 MG Tabs Take 10 mg by mouth at bedtime.   metoprolol tartrate 50 MG tablet Commonly known as: LOPRESSOR Take 1 tablet (50 mg total) by mouth 2 (two) times daily.   mupirocin ointment 2 % Commonly known as: BACTROBAN Apply 1 application topically 2 (two) times daily as needed.   ondansetron 4 MG tablet Commonly known as: ZOFRAN Take 1 tablet (4 mg total) by mouth every 8 (eight) hours as needed.   pregabalin 150 MG capsule Commonly known as: LYRICA Take 1 capsule (150 mg total) by mouth every 8 (eight) hours.   VALERIAN PO Take by mouth as needed. Makes Valerian tea about 3-4 times per week.   Vitamin B-12 500 MCG Subl Place 1,500 mcg under the tongue every other day.   Vitamin D (Ergocalciferol) 1.25 MG (50000 UNIT) Caps capsule Commonly known as: DRISDOL Take 1 capsule (50,000 Units total) by mouth every 7  (seven) days. x12 weeks.       Birth History: non-contributory  Developmental History: non-contributory  Past Surgical History: Past Surgical History:  Procedure Laterality Date  . ABLATION     Uterine  . CARDIAC CATHETERIZATION     with ablation  . COLONOSCOPY WITH PROPOFOL N/A 05/17/2015   Procedure: COLONOSCOPY WITH PROPOFOL;  Surgeon: Manya Silvas, MD;  Location[facility] ENDOSCOPY;  Service: Endoscopy;  Laterality: N/A;  . ESOPHAGOGASTRODUODENOSCOPY N/A 05/17/2015   Procedure: ESOPHAGOGASTRODUODENOSCOPY (EGD);  Surgeon: Manya Silvas, MD;  Location: [facility] ENDOSCOPY;  Service: Endoscopy;  Laterality: N/A;  . KNEE ARTHROSCOPY    . spg     6/18  . Tibial Tubercle Bypass Right 1998  . TUBAL LIGATION  10/01/99     Family History: Family History  Problem Relation Age of Onset  .  Depression Mother   . Hypertension Mother   . Cancer Mother        Skin  . Hyperlipidemia Mother   . Anxiety disorder Mother   . Migraines Mother   . Alcohol abuse Father   . Depression Father   . Stroke Father   . Heart disease Father   . Hypertension Father   . Anxiety disorder Father   . Depression Sister   . Hyperlipidemia Sister   . Diabetes Sister   . Hypertension Sister   . Polycystic ovary syndrome Sister   . Bipolar disorder Sister   . Anxiety disorder Sister   . Migraines Sister   . Cancer Maternal Grandmother 57       Breast  . Thyroid disease Maternal Grandmother   . Arthritis Maternal Grandmother   . Hyperlipidemia Maternal Grandmother   . Depression Sister   . Hypertension Sister   . Anxiety disorder Sister   . Migraines Sister   . Alzheimer's disease Other   . Aneurysm Maternal Grandfather   . Hypertension Maternal Grandfather   . Heart disease Maternal Grandfather   . Alzheimer's disease Paternal Grandmother   . Heart attack Paternal Grandfather   . Hypertension Paternal Grandfather   . COPD Paternal Grandfather   . Heart disease Paternal Grandfather   .  Migraines Son   . Migraines Daughter   . Migraines Daughter   . Bladder Cancer Neg Hx   . Kidney cancer Neg Hx      Social History: Caycee lives at home with her husband.  They live in a mobile home that is 49 years old.  There is carpeting throughout the home.  They have electric heating and central cooling.  There is 1 dog, 4 cats, and unwanted mice and spiders in the home.  There are no dust mite covers on the bedding.  There is no tobacco exposure.  She currently is a homemaker.   Review of Systems  Constitutional: Positive for malaise/fatigue. Negative for chills, fever and weight loss.  HENT: Positive for congestion, ear pain and sinus pain. Negative for ear discharge and sore throat.   Eyes: Negative for pain, discharge and redness.       Occasionaly visual disturbances with her headaches.  Respiratory: Negative for cough, sputum production, shortness of breath, wheezing and stridor.   Cardiovascular: Negative.  Negative for chest pain and palpitations.  Gastrointestinal: Positive for abdominal pain, diarrhea and nausea. Negative for blood in stool, heartburn, melena and vomiting.  Musculoskeletal: Positive for back pain, joint pain, myalgias and neck pain. Negative for falls.  Skin: Negative.  Negative for itching and rash.  Neurological: Negative for dizziness, tingling, tremors and headaches.  Endo/Heme/Allergies: Negative for environmental allergies. Does not bruise/bleed easily.       Objective:   Blood pressure 118/84, pulse 84, temperature 98.3 F (36.8 C), temperature source Temporal, resp. rate 18, height '5\' 3"'$  (1.6 m), weight 223 lb (101.2 kg), SpO2 94 %. Body mass index is 39.5 kg/m.   Physical Exam:   Physical Exam  Constitutional: She appears well-developed and well-nourished.  Non-toxic appearance. She does not have a sickly appearance.  Dysthymic mood throughout. Somewhat interactive. Some difficulty with moving around during the exam.   HENT:  Head:  Normocephalic and atraumatic.  Right Ear: Tympanic membrane and ear canal normal. There is tenderness. No drainage or swelling. Tympanic membrane is not injected, not scarred, not erythematous, not retracted and not bulging.  Left Ear: Tympanic membrane and ear  canal normal. There is tenderness. No drainage or swelling. Tympanic membrane is not injected, not scarred, not erythematous, not retracted and not bulging.  Nose: Mucosal edema and rhinorrhea present. No nose lacerations, nasal deformity or septal deviation. No epistaxis. Right sinus exhibits no maxillary sinus tenderness and no frontal sinus tenderness. Left sinus exhibits no maxillary sinus tenderness and no frontal sinus tenderness.  Mouth/Throat: Uvula is midline and oropharynx is clear and moist. Mucous membranes are not pale and not dry.  Turbinates appear enlarged, but not overly so. No polyps appreciated. No purulent discharge noted.   Eyes: Pupils are equal, round, and reactive to light. Conjunctivae and EOM are normal. Right eye exhibits no chemosis and no discharge. Left eye exhibits no chemosis and no discharge. Right conjunctiva is not injected. Left conjunctiva is not injected.  Cardiovascular: Normal rate, regular rhythm and normal heart sounds.  Respiratory: Effort normal and breath sounds normal. No accessory muscle usage. No tachypnea. No respiratory distress. She has no wheezes. She has no rhonchi. She has no rales. She exhibits no tenderness.  Moving air well in all lung fields. No increased work of breathing noted.   Lymphadenopathy:       Head (right side): No submandibular, no tonsillar and no occipital adenopathy present.       Head (left side): No submandibular, no tonsillar and no occipital adenopathy present.    She has no cervical adenopathy.  Skin: No abrasion, no petechiae and no rash noted. Rash is not papular, not vesicular and not urticarial. No erythema. No pallor.  No eczematous or urticarial lesions noted.    Psychiatric: She has a normal mood and affect.     Diagnostic studies:     Allergy Studies:   Environmental panel: negative to the 59 environmental allergens with excellent controls  Food panel: negative to the 72 food allergens with excellent controls.         Salvatore Marvel, MD [facility] [place]
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
## 2020-01-25 NOTE — Patient Instructions (Addendum)
1. Distinctly strange  - Testing was negative to the entire environmental panel as well as the food panel. - Histamine (positive control) was excellent, which means we can believe the testing today. - We are going to get a serum tryptase today to look for mast cell disease. - This might be normal today (and likely will be). - BUT I want to get another one when you have a particularly severe reaction.   2. Return in about 6 months (around 07/26/2020). This can be an in-person, a virtual Webex or a telephone follow up visit.   Please inform us of any Emergency Department visits, hospitalizations, or changes in symptoms. Call us before going to the ED for breathing or allergy symptoms since we might be able to fit you in for a sick visit. Feel free to contact us anytime with any questions, problems, or concerns.  It was a pleasure to meet you and your family today!  Websites that have reliable patient information: 1. American Academy of Asthma, Allergy, and Immunology: www.aaaai.org 2. Food Allergy Research and Education (FARE): foodallergy.org 3. Mothers of Asthmatics: http://www.asthmacommunitynetwork.org 4. American College of Allergy, Asthma, and Immunology: www.acaai.org   COVID-19 Vaccine Information can be found at: [email or web address].[email or web address].uk For questions related to vaccine distribution or appointments, please email [email or web address] or call [phone].     "Like" [place] on Facebook and Instagram for our latest updates!       HAPPY SPRING!  Make sure you are registered to vote! If you have moved or changed any of your contact information, you will need to get this updated before voting!  In some cases, you MAY be able to register to vote online: [email or web address]

## 2020-01-26 ENCOUNTER — Ambulatory Visit: Payer: Self-pay

## 2020-01-26 ENCOUNTER — Other Ambulatory Visit: Payer: Self-pay

## 2020-01-29 ENCOUNTER — Encounter: Payer: Self-pay | Admitting: Allergy & Immunology

## 2020-01-31 ENCOUNTER — Telehealth: Payer: Medicaid Other | Admitting: Pain Medicine

## 2020-02-01 ENCOUNTER — Encounter: Payer: Self-pay | Admitting: Family Medicine

## 2020-02-03 ENCOUNTER — Encounter: Payer: Self-pay | Admitting: Pain Medicine

## 2020-02-04 NOTE — Progress Notes (Signed)
Patient: Dana Bradley  Service Category: E/M  Provider: Gaspar Cola, MD  DOB: May 22, 1971  DOS: 02/07/2020  Location: Office  MRN: [id number]  Setting: Ambulatory outpatient  Referring Provider: Hubbard Hartshorn, FNP  Type: Established Patient  Specialty: Interventional Pain Management  PCP: Delsa Grana, PA-C  Location: Remote location  Delivery: TeleHealth     Virtual Encounter - Pain Management PROVIDER NOTE: Information contained herein reflects review and annotations entered in association with encounter. Interpretation of such information and data should be left to medically-trained personnel. Information provided to patient can be located elsewhere in the medical record under "Patient Instructions". Document created using STT-dictation technology, any transcriptional errors that may result from process are unintentional.    Contact & Pharmacy Preferred: [phone] Home: [phone] (home) Mobile: [phone] (mobile) E-mail: [email or web address]  [facility] [place], [address] [place] [address] Phone: [phone] Fax: [phone]   Pre-screening  [name] offered "in-person" vs "virtual" encounter. She indicated preferring virtual for this encounter.   Reason COVID-19*  Social distancing based on CDC and AMA recommendations.   I contacted [name] on 02/07/2020 via telephone.      I clearly identified myself as Gaspar Cola, MD. I verified that I was speaking with the correct person using two identifiers (Name: Dana Bradley, and date of birth: 12/29/1970).  Consent I sought verbal advanced consent from [name] for virtual visit interactions. I informed Ms. Loiselle of possible security and privacy concerns, risks, and limitations associated with providing "not-in-person" medical evaluation and management services. I also informed Ms. Veitch of the availability of "in-person" appointments. Finally, I  informed her that there would be a charge for the virtual visit and that she could be  personally, fully or partially, financially responsible for it. Ms. Marasigan expressed understanding and agreed to proceed.   Historic Elements   [name] is a 49 y.o. year old, female patient evaluated today after her last contact with our practice on 10/12/2019. Ms. Siegenthaler  has a past medical history of Acute postoperative pain (04/07/2017), Anxiety, Bursitis, Chronic fatigue (12/12/2017), Chronic fatigue syndrome, Edema leg (05/02/2015), Fibromyalgia, GERD (gastroesophageal reflux disease), IBS (irritable bowel syndrome), Knee pain, bilateral (12/21/2008), Lumbar discitis, Migraines, Osteoarthritis, Right hand pain (04/10/2015), Sleep apnea, Spinal stenosis, SVT (supraventricular tachycardia) ([place]), Vertigo, and Vitamin D deficiency (05/01/2016). She also  has a past surgical history that includes Ablation; Tubal ligation (10/01/99); Cardiac catheterization; Knee arthroscopy; Colonoscopy with propofol (N/A, 05/17/2015); Esophagogastroduodenoscopy (N/A, 05/17/2015); spg; and Tibial Tubercle Bypass (Right, 1998). Dana Bradley has a current medication list which includes the following prescription(s): acetaminophen, atorvastatin, butalbital-acetaminophen-caffeine, vitamin b-12, dexilant, dicyclomine, hydrocodone-acetaminophen, lipase/protease/amylase, magnesium oxide, melatonin, metoprolol tartrate, mupirocin ointment, ondansetron, [START ON 03/19/2020] pregabalin, valerian, vitamin d (ergocalciferol), baclofen, and diclofenac sodium. She  reports that she quit smoking about 27 years ago. Her smoking use included cigarettes. She has a 12.00 pack-year smoking history. She has never used smokeless tobacco. She reports that she does not drink alcohol or use drugs. Dana Bradley is allergic to aspirin; cymbalta [duloxetine hcl]; depakote [divalproex sodium]; gadolinium derivatives; haloperidol; meperidine; reglan [metoclopramide]; tramadol hcl;  trazodone; compazine [prochlorperazine]; meloxicam; penicillins; tomato; other; shellfish allergy; shellfish-derived products; bacitracin-neomycin-polymyxin; cephalosporins; ibuprofen; latex; neosporin [neomycin-bacitracin zn-polymyx]; nsaids; sulfa antibiotics; and sulfonamide derivatives.   HPI  Today, she is being contacted for medication management.  According to the patient's PMP, she has only had 1 of her 3 prescriptions written on 10/13/2019 filled.  According to the PMP it was filled on 11/06/2019.  She indicates taking the hydrocodone on a as needed basis. The patient indicates doing well with the current medication regimen. No adverse reactions or side effects reported to the medications.  She indicates that on the average 1 bottle (prescription) will last her 2 months.  From now on we will be adjusting her dose to reflect that.  The patient indicates that the physical therapy has helped her leg pain and back pain.  She is experiencing a normal pain that she did not have before and she is interested in having an evaluation for it.  She indicates that this pain runs from her knee down to her ankle on the right leg over the area of her shin bone, lateral to midline.  She also has been experiencing some knee pain on that same leg, but the knee pain seems to be more prevalent in the medial aspect of the knee.  Patient was asked if she wanted for [place] to schedule an appointment for her to come in and she indicated that she will call.  The plan is that when the patient comes in on her next visit we will examine her right knee to see if one of the genicular nerves is responsible for this pain that she has been experiencing them the leg.  In addition her next medication refill for her hydrocodone will reflect that she is only taking 15 tablets/month.  Pharmacotherapy Assessment  Analgesic: Hydrocodone/APAP 7.5/325, 1 tab PO QD (PRN) she is using approximately 15 tablets/month  MME/day:3.'75mg'$ /day.    Monitoring: [place] PMP: PDMP reviewed during this encounter.       Pharmacotherapy: No side-effects or adverse reactions reported. Compliance: No problems identified. Effectiveness: Clinically acceptable. Plan: Refer to "POC".  UDS:  Summary  Date Value Ref Range Status  03/05/2018 FINAL  Final    Comment:    ==================================================================== TOXASSURE SELECT 13 (MW) ==================================================================== Test                             Result       Flag       Units Drug Present   Hydrocodone                    422                     ng/mg creat   Hydromorphone                  105                     ng/mg creat   Norhydrocodone                 312                     ng/mg creat    Sources of hydrocodone include scheduled prescription    medications. Hydromorphone and norhydrocodone are expected    metabolites of hydrocodone. Hydromorphone is also available as a    scheduled prescription medication. ==================================================================== Test                      Result    Flag   Units      Ref Range   Creatinine              148  mg/dL      >=20 ==================================================================== Declared Medications:  Medication list was not provided. ==================================================================== For clinical consultation, please call [phone]. ====================================================================    Laboratory Chemistry Profile   Renal Lab Results  Component Value Date   BUN 15 09/21/2019   CREATININE 0.62 75/44/9201   BCR NOT APPLICABLE 00/71/2197   GFRAA >60 09/21/2019   GFRNONAA >60 09/21/2019     Hepatic Lab Results  Component Value Date   AST 21 09/21/2019   ALT 41 09/21/2019   ALBUMIN 4.0 09/21/2019   ALKPHOS 69 09/21/2019   HCVAB NON REACTIVE 09/21/2019     Electrolytes Lab Results   Component Value Date   NA 138 09/21/2019   K 3.7 09/21/2019   CL 106 09/21/2019   CALCIUM 9.1 09/21/2019   MG 2.1 04/01/2017     Bone Lab Results  Component Value Date   VD25OH 12.48 (L) 08/01/2019   25OHVITD1 20 (L) 04/01/2017   25OHVITD2 5.4 04/01/2017   25OHVITD3 15 04/01/2017     Inflammation (CRP: Acute Phase) (ESR: Chronic Phase) Lab Results  Component Value Date   CRP 6.0 07/30/2018   ESRSEDRATE 17 07/30/2018       Note: Above Lab results reviewed.  Imaging  CT ABDOMEN PELVIS W CONTRAST CLINICAL DATA:  Acute generalized abdominal pain with diarrhea and cramping.  EXAM: CT ABDOMEN AND PELVIS WITH CONTRAST  TECHNIQUE: Multidetector CT imaging of the abdomen and pelvis was performed using the standard protocol following bolus administration of intravenous contrast.  CONTRAST:  160m OMNIPAQUE IOHEXOL 300 MG/ML  SOLN  COMPARISON:  None.  FINDINGS: Lower chest: Very minor dependent basilar atelectasis/hypoventilatory changes. Normal heart size. No pericardial or pleural effusion. Esophagus is nondilated. No hiatal hernia.  Hepatobiliary: Mild decreased hepatic attenuation compatible with hepatic steatosis. No focal hepatic abnormality or intrahepatic biliary dilatation. Gallbladder nondilated. Common bile duct nondistended.  Pancreas: Unremarkable. No pancreatic ductal dilatation or surrounding inflammatory changes.  Spleen: Normal in size without focal abnormality.  Adrenals/Urinary Tract: Normal adrenal glands. No renal obstruction or hydronephrosis. Ureters are symmetric and decompressed. No obstructing ureteral or urinary tract calculus.  Right kidney demonstrates a lower pole nonobstructing radiopaque calculus measuring 12 mm. No focal renal abnormality.  Stomach/Bowel: Nonspecific distention of the stomach. Negative for bowel obstruction, significant dilatation, ileus, or free air. Very small nondilated appendix suspected in the retrocecal  area. No acute inflammatory process appreciated.  No free fluid, fluid collection, hemorrhage, hematoma, or abscess.  Vascular/Lymphatic: No significant atherosclerotic process. Mesenteric and renal vasculature all appear patent. Negative for aneurysm or acute vascular process. No veno-occlusive disease. No bulky adenopathy.  Reproductive: Uterus normal in size and anteverted in position. Small ovarian cysts noted bilaterally measuring 2.7 cm on the left and 2.4 cm on the right. No significant pelvic free fluid, fluid collection, hemorrhage, hematoma, or abscess.  Other: No abdominal wall hernia or abnormality. No abdominopelvic ascites.  Musculoskeletal: No acute osseous finding.  IMPRESSION: No acute intra-abdominal or pelvic finding by CT.  Mild hepatic steatosis  Nonobstructing right nephrolithiasis  Bilateral ovarian cysts, measurements above  Electronically Signed   By: MJerilynn Mages  Shick M.D.   On: 10/18/2019 15:37  Assessment  The primary encounter diagnosis was Chronic pain syndrome. Diagnoses of Chronic lower extremity pain (Bilateral), Chronic low back pain (Primary Area of Pain) (Bilateral) (R>L) (midline), Lumbar radiculitis (Left), Weakness of leg (Left), Cervical spondylosis with myelopathy and radiculopathy, Chronic migraine without aura, with intractable migraine, so stated, with status migrainosus, Difficulty walking, Pharmacologic  therapy, Muscle spasticity, and Fibromyalgia were also pertinent to this visit.  Plan of Care  Problem-specific:  No problem-specific Assessment & Plan notes found for this encounter.  Ms. Nakeia Calvi has a current medication list which includes the following long-term medication(s): atorvastatin, baclofen, dexilant, diclofenac sodium, dicyclomine, hydrocodone-acetaminophen, metoprolol tartrate, and [START ON 03/19/2020] pregabalin.  Pharmacotherapy (Medications Ordered): Meds ordered this encounter  Medications  . baclofen (LIORESAL) 10  MG tablet    Sig: Take 1 tablet (10 mg total) by mouth 3 (three) times daily.    Dispense:  90 tablet    Refill:  5    Fill one day early if pharmacy is closed on scheduled refill date. May substitute for generic if available.  . pregabalin (LYRICA) 150 MG capsule    Sig: Take 1 capsule (150 mg total) by mouth every 8 (eight) hours.    Dispense:  270 capsule    Refill:  1    Fill one day early if pharmacy is closed on scheduled refill date. May substitute for generic if available.   Orders:  Orders Placed This Encounter  Procedures  . ToxASSURE Select 13 (MW), Urine    Volume: 30 ml(s). Minimum 3 ml of urine is needed. Document temperature of fresh sample. Indications: Long term (current) use of opiate analgesic (Y81.859)    Order Specific Question:   Release to patient    Answer:   Immediate   Follow-up plan:   Return if symptoms worsen or fail to improve, for (She will call).      Interventional treatment options:  Under consideration: No further injections since the patient does not seem to be responding well to this type of therapy.   Therapeutic/palliative (PRN): Diagnostic left SI joint block #2  Diagnostic/therapeutic left IA hip joint injection #2  Diagnostic bilateral lumbar facet block #2  Palliative left trapezius muscle MNB #4 Palliative right trapezius muscle MNB #2 Palliative left L1-2 LESI #2 Palliative right L1-2 LESI #2 Palliativeleft CESI #3 Palliative left greater occipital NB #3  Palliative left C2 + TON NB #2  Palliative left GON + C2 + TON RFA #2 (last done 12/25/2017)      Recent Visits No visits were found meeting these conditions.  Showing recent visits within past 90 days and meeting all other requirements   Future Appointments Date Type Provider Dept  03/01/20 Appointment Milinda Pointer, MD [facility]  Showing future appointments within next 90 days and meeting all other requirements   I discussed the assessment and  treatment plan with the patient. The patient was provided an opportunity to ask questions and all were answered. The patient agreed with the plan and demonstrated an understanding of the instructions.  Patient advised to call back or seek an in-person evaluation if the symptoms or condition worsens.  Duration of encounter: 15 minutes.  Note by: Gaspar Cola, MD Date: 02/07/2020; Time: 11:38 AM

## 2020-02-06 DIAGNOSIS — Z79899 Other long term (current) drug therapy: Secondary | ICD-10-CM | POA: Insufficient documentation

## 2020-02-06 DIAGNOSIS — Z1211 Encounter for screening for malignant neoplasm of colon: Secondary | ICD-10-CM | POA: Insufficient documentation

## 2020-02-07 ENCOUNTER — Ambulatory Visit: Payer: Medicaid Other | Attending: Pain Medicine | Admitting: Pain Medicine

## 2020-02-07 ENCOUNTER — Other Ambulatory Visit: Payer: Self-pay | Admitting: Pain Medicine

## 2020-02-07 DIAGNOSIS — Z79899 Other long term (current) drug therapy: Secondary | ICD-10-CM

## 2020-02-07 DIAGNOSIS — G894 Chronic pain syndrome: Secondary | ICD-10-CM

## 2020-02-07 DIAGNOSIS — M4712 Other spondylosis with myelopathy, cervical region: Secondary | ICD-10-CM

## 2020-02-07 DIAGNOSIS — G8929 Other chronic pain: Secondary | ICD-10-CM

## 2020-02-07 DIAGNOSIS — M4722 Other spondylosis with radiculopathy, cervical region: Secondary | ICD-10-CM

## 2020-02-07 DIAGNOSIS — M79605 Pain in left leg: Secondary | ICD-10-CM

## 2020-02-07 DIAGNOSIS — R29898 Other symptoms and signs involving the musculoskeletal system: Secondary | ICD-10-CM

## 2020-02-07 DIAGNOSIS — M5442 Lumbago with sciatica, left side: Secondary | ICD-10-CM

## 2020-02-07 DIAGNOSIS — R262 Difficulty in walking, not elsewhere classified: Secondary | ICD-10-CM

## 2020-02-07 DIAGNOSIS — M62838 Other muscle spasm: Secondary | ICD-10-CM

## 2020-02-07 DIAGNOSIS — M797 Fibromyalgia: Secondary | ICD-10-CM

## 2020-02-07 DIAGNOSIS — G43711 Chronic migraine without aura, intractable, with status migrainosus: Secondary | ICD-10-CM

## 2020-02-07 DIAGNOSIS — M79604 Pain in right leg: Secondary | ICD-10-CM

## 2020-02-07 DIAGNOSIS — M5416 Radiculopathy, lumbar region: Secondary | ICD-10-CM

## 2020-02-07 MED ORDER — PREGABALIN 150 MG PO CAPS: 150.0000 mg | ORAL_CAPSULE | Freq: Three times a day (TID) | ORAL | 1 refills | Status: DC

## 2020-02-07 MED ORDER — BACLOFEN 10 MG PO TABS: 10.0000 mg | ORAL_TABLET | Freq: Three times a day (TID) | ORAL | 5 refills | Status: DC

## 2020-02-15 ENCOUNTER — Ambulatory Visit: Payer: Self-pay | Attending: Pain Medicine

## 2020-02-15 ENCOUNTER — Encounter: Payer: Self-pay | Admitting: Physical Medicine & Rehabilitation

## 2020-02-15 ENCOUNTER — Other Ambulatory Visit: Payer: Self-pay

## 2020-02-15 DIAGNOSIS — M5440 Lumbago with sciatica, unspecified side: Secondary | ICD-10-CM | POA: Insufficient documentation

## 2020-02-15 DIAGNOSIS — G8929 Other chronic pain: Secondary | ICD-10-CM | POA: Insufficient documentation

## 2020-02-15 DIAGNOSIS — M25521 Pain in right elbow: Secondary | ICD-10-CM | POA: Insufficient documentation

## 2020-02-15 DIAGNOSIS — R252 Cramp and spasm: Secondary | ICD-10-CM | POA: Insufficient documentation

## 2020-02-15 DIAGNOSIS — M6281 Muscle weakness (generalized): Secondary | ICD-10-CM | POA: Insufficient documentation

## 2020-02-15 DIAGNOSIS — M542 Cervicalgia: Secondary | ICD-10-CM | POA: Insufficient documentation
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
## 2020-02-17 ENCOUNTER — Ambulatory Visit: Payer: Self-pay

## 2020-02-22 ENCOUNTER — Other Ambulatory Visit
Admission: RE | Admit: 2020-02-22 | Discharge: 2020-02-22 | Disposition: A | Discharge status: Home / Self Care | Payer: Medicaid Other | Source: Ambulatory Visit | Attending: Pain Medicine | Admitting: Pain Medicine

## 2020-02-22 ENCOUNTER — Other Ambulatory Visit
Admission: RE | Admit: 2020-02-22 | Discharge: 2020-02-22 | Disposition: A | Discharge status: Home / Self Care | Payer: Medicaid Other | Source: Ambulatory Visit | Attending: Family Medicine | Admitting: Family Medicine

## 2020-02-22 ENCOUNTER — Ambulatory Visit: Payer: Self-pay

## 2020-02-22 DIAGNOSIS — E538 Deficiency of other specified B group vitamins: Secondary | ICD-10-CM

## 2020-02-22 DIAGNOSIS — R109 Unspecified abdominal pain: Secondary | ICD-10-CM

## 2020-02-22 DIAGNOSIS — K909 Intestinal malabsorption, unspecified: Secondary | ICD-10-CM

## 2020-02-22 DIAGNOSIS — Z79899 Other long term (current) drug therapy: Secondary | ICD-10-CM | POA: Insufficient documentation

## 2020-02-22 DIAGNOSIS — R7303 Prediabetes: Secondary | ICD-10-CM | POA: Insufficient documentation

## 2020-02-22 DIAGNOSIS — G894 Chronic pain syndrome: Secondary | ICD-10-CM | POA: Insufficient documentation

## 2020-02-22 DIAGNOSIS — I10 Essential (primary) hypertension: Secondary | ICD-10-CM | POA: Insufficient documentation

## 2020-02-22 DIAGNOSIS — E782 Mixed hyperlipidemia: Secondary | ICD-10-CM | POA: Insufficient documentation

## 2020-02-22 DIAGNOSIS — E559 Vitamin D deficiency, unspecified: Secondary | ICD-10-CM | POA: Insufficient documentation

## 2020-02-22 LAB — CBC WITH DIFFERENTIAL/PLATELET
Abs Immature Granulocytes: 0.12 10*3/uL — ABNORMAL HIGH (ref 0.00–0.07)
Basophils Absolute: 0.1 10*3/uL (ref 0.0–0.1)
Basophils Relative: 1 %
Eosinophils Absolute: 0.1 10*3/uL (ref 0.0–0.5)
Eosinophils Relative: 1 %
HCT: 41.3 % (ref 36.0–46.0)
Hemoglobin: 13.9 g/dL (ref 12.0–15.0)
Immature Granulocytes: 1 %
Lymphocytes Relative: 23 %
Lymphs Abs: 2.2 10*3/uL (ref 0.7–4.0)
MCH: 32 pg (ref 26.0–34.0)
MCHC: 33.7 g/dL (ref 30.0–36.0)
MCV: 94.9 fL (ref 80.0–100.0)
Monocytes Absolute: 0.7 10*3/uL (ref 0.1–1.0)
Monocytes Relative: 7 %
Neutro Abs: 6.4 10*3/uL (ref 1.7–7.7)
Neutrophils Relative %: 67 %
Platelets: 272 10*3/uL (ref 150–400)
RBC: 4.35 MIL/uL (ref 3.87–5.11)
RDW: 12.7 % (ref 11.5–15.5)
WBC: 9.6 10*3/uL (ref 4.0–10.5)
nRBC: 0 % (ref 0.0–0.2)

## 2020-02-22 LAB — COMPREHENSIVE METABOLIC PANEL
ALT: 37 U/L (ref 0–44)
AST: 28 U/L (ref 15–41)
Albumin: 4.1 g/dL (ref 3.5–5.0)
Alkaline Phosphatase: 83 U/L (ref 38–126)
Anion gap: 9 (ref 5–15)
BUN: 9 mg/dL (ref 6–20)
CO2: 26 mmol/L (ref 22–32)
Calcium: 9.3 mg/dL (ref 8.9–10.3)
Chloride: 105 mmol/L (ref 98–111)
Creatinine, Ser: 0.65 mg/dL (ref 0.44–1.00)
GFR calc Af Amer: >60 mL/min (ref >60)
GFR calc non Af Amer: >60 mL/min (ref >60)
Glucose, Bld: 103 mg/dL — ABNORMAL HIGH (ref 70–99)
Potassium: 3.8 mmol/L (ref 3.5–5.1)
Sodium: 140 mmol/L (ref 135–145)
Total Bilirubin: 0.7 mg/dL (ref 0.3–1.2)
Total Protein: 7.5 g/dL (ref 6.5–8.1)

## 2020-02-22 LAB — LIPID PANEL
Cholesterol: 174 mg/dL (ref 0–200)
HDL: 38 mg/dL — ABNORMAL LOW (ref >40)
LDL Cholesterol: 79 mg/dL (ref 0–99)
Total CHOL/HDL Ratio: 4.6 RATIO
Triglycerides: 286 mg/dL — ABNORMAL HIGH (ref <150)
VLDL: 57 mg/dL — ABNORMAL HIGH (ref 0–40)

## 2020-02-22 LAB — HEMOGLOBIN A1C
Hgb A1c MFr Bld: 5.8 % — ABNORMAL HIGH (ref 4.8–5.6)
Mean Plasma Glucose: 119.76 mg/dL

## 2020-02-22 LAB — VITAMIN B12: Vitamin B-12: 532 pg/mL (ref 180–914)

## 2020-02-22 LAB — VITAMIN D 25 HYDROXY (VIT D DEFICIENCY, FRACTURES): Vit D, 25-Hydroxy: 16.49 ng/mL — ABNORMAL LOW (ref 30–100)

## 2020-02-24 ENCOUNTER — Ambulatory Visit: Payer: Self-pay

## 2020-02-25 LAB — MISC LABCORP TEST (SEND OUT)
LabCorp test name: 738526
Labcorp test code: 738526

## 2020-03-01 ENCOUNTER — Ambulatory Visit: Payer: Self-pay | Attending: Pain Medicine

## 2020-03-01 ENCOUNTER — Other Ambulatory Visit: Payer: Self-pay

## 2020-03-01 ENCOUNTER — Ambulatory Visit: Payer: Self-pay | Attending: Pain Medicine | Admitting: Pain Medicine

## 2020-03-01 ENCOUNTER — Encounter: Payer: Self-pay | Admitting: Pain Medicine

## 2020-03-01 VITALS — BP 124/80 | HR 73 | Temp 97.2°F | Resp 18 | Ht 63.0 in | Wt 225.0 lb

## 2020-03-01 DIAGNOSIS — R262 Difficulty in walking, not elsewhere classified: Secondary | ICD-10-CM | POA: Insufficient documentation

## 2020-03-01 DIAGNOSIS — M4807 Spinal stenosis, lumbosacral region: Secondary | ICD-10-CM | POA: Insufficient documentation

## 2020-03-01 DIAGNOSIS — R269 Unspecified abnormalities of gait and mobility: Secondary | ICD-10-CM | POA: Insufficient documentation

## 2020-03-01 DIAGNOSIS — M19012 Primary osteoarthritis, left shoulder: Secondary | ICD-10-CM | POA: Insufficient documentation

## 2020-03-01 DIAGNOSIS — Z79899 Other long term (current) drug therapy: Secondary | ICD-10-CM | POA: Insufficient documentation

## 2020-03-01 DIAGNOSIS — M5442 Lumbago with sciatica, left side: Secondary | ICD-10-CM | POA: Insufficient documentation

## 2020-03-01 DIAGNOSIS — M79605 Pain in left leg: Secondary | ICD-10-CM | POA: Insufficient documentation

## 2020-03-01 DIAGNOSIS — M5440 Lumbago with sciatica, unspecified side: Secondary | ICD-10-CM | POA: Insufficient documentation

## 2020-03-01 DIAGNOSIS — M542 Cervicalgia: Secondary | ICD-10-CM | POA: Insufficient documentation

## 2020-03-01 DIAGNOSIS — M5136 Other intervertebral disc degeneration, lumbar region: Secondary | ICD-10-CM | POA: Insufficient documentation

## 2020-03-01 DIAGNOSIS — M5137 Other intervertebral disc degeneration, lumbosacral region: Secondary | ICD-10-CM | POA: Insufficient documentation

## 2020-03-01 DIAGNOSIS — M19011 Primary osteoarthritis, right shoulder: Secondary | ICD-10-CM | POA: Insufficient documentation

## 2020-03-01 DIAGNOSIS — R252 Cramp and spasm: Secondary | ICD-10-CM | POA: Insufficient documentation

## 2020-03-01 DIAGNOSIS — M5126 Other intervertebral disc displacement, lumbar region: Secondary | ICD-10-CM | POA: Insufficient documentation

## 2020-03-01 DIAGNOSIS — Z9181 History of falling: Secondary | ICD-10-CM | POA: Insufficient documentation

## 2020-03-01 DIAGNOSIS — M5441 Lumbago with sciatica, right side: Secondary | ICD-10-CM | POA: Insufficient documentation

## 2020-03-01 DIAGNOSIS — G8929 Other chronic pain: Secondary | ICD-10-CM | POA: Insufficient documentation

## 2020-03-01 DIAGNOSIS — M79604 Pain in right leg: Secondary | ICD-10-CM | POA: Insufficient documentation

## 2020-03-01 DIAGNOSIS — M7702 Medial epicondylitis, left elbow: Secondary | ICD-10-CM | POA: Insufficient documentation

## 2020-03-01 DIAGNOSIS — G894 Chronic pain syndrome: Secondary | ICD-10-CM | POA: Insufficient documentation

## 2020-03-01 DIAGNOSIS — M47816 Spondylosis without myelopathy or radiculopathy, lumbar region: Secondary | ICD-10-CM | POA: Insufficient documentation

## 2020-03-01 DIAGNOSIS — M25521 Pain in right elbow: Secondary | ICD-10-CM | POA: Insufficient documentation

## 2020-03-01 DIAGNOSIS — N39498 Other specified urinary incontinence: Secondary | ICD-10-CM | POA: Insufficient documentation

## 2020-03-01 DIAGNOSIS — M6281 Muscle weakness (generalized): Secondary | ICD-10-CM | POA: Insufficient documentation

## 2020-03-01 MED ORDER — HYDROCODONE-ACETAMINOPHEN 7.5-325 MG PO TABS: 1.0000 | ORAL_TABLET | Freq: Four times a day (QID) | ORAL | 0 refills | Status: DC | PRN

## 2020-03-01 MED ORDER — ORPHENADRINE CITRATE 30 MG/ML IJ SOLN
INTRAMUSCULAR | Status: AC
  Filled 2020-03-01: qty 2

## 2020-03-01 MED ORDER — DICLOFENAC SODIUM 1 % EX GEL: 2.0000 g | Freq: Four times a day (QID) | CUTANEOUS | 99 refills | Status: DC | PRN

## 2020-03-01 MED ORDER — PREDNISONE 20 MG PO TABS: ORAL_TABLET | ORAL | 0 refills | Status: AC

## 2020-03-01 MED ORDER — ORPHENADRINE CITRATE 30 MG/ML IJ SOLN
60.0000 mg | Freq: Once | INTRAMUSCULAR | Status: AC
  Administered 2020-03-01: 60 mg via INTRAMUSCULAR

## 2020-03-01 MED ORDER — KETOROLAC TROMETHAMINE 60 MG/2ML IM SOLN
INTRAMUSCULAR | Status: AC
  Filled 2020-03-01: qty 2

## 2020-03-01 NOTE — Progress Notes (Signed)
PROVIDER NOTE: Information contained herein reflects review and annotations entered in association with encounter. Interpretation of such information and data should be left to medically-trained personnel. Information provided to patient can be located elsewhere in the medical record under "Patient Instructions". Document created using STT-dictation technology, any transcriptional errors that may result from process are unintentional.    Patient: Dana Bradley  Service Category: E/M  Provider: Gaspar Cola, MD  DOB: December 27, 1970  DOS: 03/01/2020  Referring Provider: Laurell Roof  MRN: [id number]  Setting: Ambulatory outpatient  PCP: Delsa Grana, PA-C  Type: Established Patient  Specialty: Interventional Pain Management    Location: Office  Delivery: Face-to-face     HPI  Reason for encounter: [name], a 49 y.o. year old female, is here today for evaluation and management of her Bilateral leg cramps [R25.2]. [name] primary complain today is Leg Pain (right), Back Pain (mid), and Hip Pain (bilateral) Last encounter: Practice (10/12/2019). My last encounter with her was on 09/28/2019. Pertinent problems: [name] has Cervico-occipital neuralgia; DDD (degenerative disc disease), lumbosacral; Chronic pain of multiple joints; DDD (degenerative disc disease), lumbar; Fibromyalgia; Migraine without aura and with status migrainosus, not intractable; Chronic lower extremity cramps (Bilateral) (R>L); Chronic pain syndrome; Neurogenic pain; Chronic low back pain (Primary Area of Pain) (Bilateral) (R>L) (midline); Chronic upper back pain (Secondary area of Pain) (Bilateral) (L>R); Chronic abdominal pain (Right lower quadrant); Thoracic radiculitis (Bilateral: T10, T11); Chronic occipital neuralgia (Third area of Pain) (Bilateral) (L>R); Chronic neck pain; Chronic cervical radicular pain (Bilateral) (L>R); Chronic shoulder blade pain (Bilateral) (L>R); Chronic upper extremity pain (Bilateral) (R>L);  Chronic knee pain (Bilateral) (R>L); Chronic ankle pain (Bilateral); Cervical spondylosis with myelopathy and radiculopathy; Nephrolithiasis; Plantar fasciitis of left foot; Cervicogenic headache; Bilateral leg edema; Chronic hip pain (Left); Chronic sacroiliac joint pain (Left); Lumbar facet syndrome (Bilateral) (L>R); Lumbar radiculitis (Left); Lumbar spondylosis; Migraine headache; Muscle spasticity; Osteoarthritis of shoulder (Bilateral); Lumbar L1-2 disc protrusion (Right); Myofascial pain syndrome (Left) (trapezius muscle); Trigger point of shoulder region (Left); Chronic fatigue syndrome with fibromyalgia; Occipital headache; Trigger point of neck (Left); Malar rash; Chronic migraine without aura, with intractable migraine, so stated, with status migrainosus; Weakness of leg (Left); Difficulty walking; Migraine with aura and with status migrainosus, not intractable; Cervico-occipital neuralgia of left side; DDD (degenerative disc disease), cervical; Cervical facet syndrome (Bilateral) (L>R); DDD (degenerative disc disease), thoracic; Osteoarthritis of hip (Left); Chronic groin pain (Bilateral) (L>R); Chronic hip pain (Bilateral) (L>R); Somatic dysfunction of sacroiliac joint (Bilateral); Other specified dorsopathies, sacral and sacrococcygeal region; Chronic lower extremity pain (Bilateral); Lumbar radiculitis (Right); and Neurogenic urinary incontinence on their pertinent problem list. Pain Assessment: Severity of Chronic pain is reported as a 5 /10. Location: Leg Right/lateral leg to knee then it is anterior. Onset: More than a month ago. Quality: Aching, Burning, Shooting. Timing: Intermittent. Modifying factor(s): medications. Vitals:  height is '5\' 3"'$  (1.6 m) and weight is 225 lb (102.1 kg). Her temporal temperature is 97.2 F (36.2 C) (abnormal). Her blood pressure is 124/80 and her pulse is 73. Her respiration is 18 and oxygen saturation is 99%.   Today the patient is a experiencing acute on  chronic low back pain and bilateral lower extremity pain with the bilateral lower extremity pain being the worst component.  She indicates that the pain starts at the level of her mid back, right under her bra area, and it goes down and around her abdominal area following the distribution of the L1 and L2 nerve  roots, bilaterally, with the right side being worse than the left.  Her MRI indicates that the patient has an L1-2 right-sided disc protrusion.  Unfortunately, the patient is experiencing weakness and she has also had episodes of urinary incontinence.  At this point I believe that this patient needs to be evaluated by a neurosurgeon for a decompressive laminectomy and/or discectomy.  The patient indicates that her choices are limited to the colon system since she is indigent.  The patient indicates that the pain has gotten to the point where she is uncomfortable no matter what position she is in.  She also has described having to go up on her pain medicine from taking 1 tablet p.o. daily as needed to now taking it every 6 hours.  Today I will provide her with a prescription for a Medrol Dosepak to see if we can get this pain under control and I will be sending her to have a repeat MRI since her pain has worsened significantly since her last MRI.  In addition, I will be sending her to have a neurosurgical consult since I do believe that at this point she is a surgical candidate.  Pharmacotherapy Assessment  Analgesic: Hydrocodone/APAP 7.5/325, 1 tab PO q 6 hrs MME/day:3.'75mg'$ /day.   Monitoring: [place] PMP: PDMP reviewed during this encounter.       Pharmacotherapy: No side-effects or adverse reactions reported. Compliance: No problems identified. Effectiveness: Clinically acceptable.  UDS:  Summary  Date Value Ref Range Status  03/05/2018 FINAL  Final    Comment:    ==================================================================== TOXASSURE SELECT 13  (MW) ==================================================================== Test                             Result       Flag       Units Drug Present   Hydrocodone                    422                     ng/mg creat   Hydromorphone                  105                     ng/mg creat   Norhydrocodone                 312                     ng/mg creat    Sources of hydrocodone include scheduled prescription    medications. Hydromorphone and norhydrocodone are expected    metabolites of hydrocodone. Hydromorphone is also available as a    scheduled prescription medication. ==================================================================== Test                      Result    Flag   Units      Ref Range   Creatinine              148              mg/dL      >=20 ==================================================================== Declared Medications:  Medication list was not provided. ==================================================================== For clinical consultation, please call [phone]. ====================================================================    ROS  Constitutional: Denies any fever or chills Gastrointestinal: No reported hemesis, hematochezia, vomiting, or acute GI distress Musculoskeletal:  Denies any acute onset joint swelling, redness, loss of ROM, or weakness Neurological: No reported episodes of acute onset apraxia, aphasia, dysarthria, agnosia, amnesia, paralysis, loss of coordination, or loss of consciousness  Medication Review  Dexlansoprazole, HYDROcodone-acetaminophen, Melatonin, Valerian, Vitamin B-12, Vitamin D (Ergocalciferol), atorvastatin, baclofen, butalbital-acetaminophen-caffeine, diclofenac Sodium, diclofenac sodium, dicyclomine, magnesium oxide, metoprolol tartrate, mupirocin ointment, ondansetron, predniSONE, and pregabalin  History Review  Allergy: [name] is allergic to aspirin; cymbalta [duloxetine hcl]; depakote [divalproex  sodium]; gadolinium derivatives; haloperidol; meperidine; reglan [metoclopramide]; tramadol hcl; trazodone; compazine [prochlorperazine]; meloxicam; penicillins; tomato; other; shellfish allergy; shellfish-derived products; bacitracin-neomycin-polymyxin; cephalosporins; ibuprofen; latex; neosporin [neomycin-bacitracin zn-polymyx]; nsaids; sulfa antibiotics; and sulfonamide derivatives. Drug: [name]  reports no history of drug use. Alcohol:  reports no history of alcohol use. Tobacco:  reports that she quit smoking about 27 years ago. Her smoking use included cigarettes. She has a 12.00 pack-year smoking history. She has never used smokeless tobacco. Social: [name]  reports that she quit smoking about 27 years ago. Her smoking use included cigarettes. She has a 12.00 pack-year smoking history. She has never used smokeless tobacco. She reports that she does not drink alcohol or use drugs. Medical:  has a past medical history of Acute postoperative pain (04/07/2017), Anxiety, Bursitis, Chronic fatigue (12/12/2017), Chronic fatigue syndrome, Edema leg (05/02/2015), Fibromyalgia, GERD (gastroesophageal reflux disease), IBS (irritable bowel syndrome), Knee pain, bilateral (12/21/2008), Lumbar discitis, Migraines, Osteoarthritis, Right hand pain (04/10/2015), Sleep apnea, Spinal stenosis, SVT (supraventricular tachycardia) ([place]), Vertigo, and Vitamin D deficiency (05/01/2016). Surgical: Ms. Braaten  has a past surgical history that includes Ablation; Tubal ligation (10/01/99); Cardiac catheterization; Knee arthroscopy; Colonoscopy with propofol (N/A, 05/17/2015); Esophagogastroduodenoscopy (N/A, 05/17/2015); spg; and Tibial Tubercle Bypass (Right, 1998). Family: family history includes Alcohol abuse in her father; Alzheimer's disease in her paternal grandmother and another family member; Aneurysm in her maternal grandfather; Anxiety disorder in her father, mother, sister, and sister; Arthritis in her maternal grandmother;  Bipolar disorder in her sister; COPD in her paternal grandfather; Cancer in her mother; Cancer (age of onset: 46) in her maternal grandmother; Depression in her father, mother, sister, and sister; Diabetes in her sister; Heart attack in her paternal grandfather; Heart disease in her father, maternal grandfather, and paternal grandfather; Hyperlipidemia in her maternal grandmother, mother, and sister; Hypertension in her father, maternal grandfather, mother, paternal grandfather, sister, and sister; Migraines in her daughter, daughter, mother, sister, sister, and son; Polycystic ovary syndrome in her sister; Stroke in her father; Thyroid disease in her maternal grandmother.  Laboratory Chemistry Profile   Renal Lab Results  Component Value Date   BUN 9 02/22/2020   CREATININE 0.65 42/87/6811   BCR NOT APPLICABLE 57/26/2035   GFRAA >60 02/22/2020   GFRNONAA >60 02/22/2020     Hepatic Lab Results  Component Value Date   AST 28 02/22/2020   ALT 37 02/22/2020   ALBUMIN 4.1 02/22/2020   ALKPHOS 83 02/22/2020   HCVAB NON REACTIVE 09/21/2019     Electrolytes Lab Results  Component Value Date   NA 140 02/22/2020   K 3.8 02/22/2020   CL 105 02/22/2020   CALCIUM 9.3 02/22/2020   MG 2.1 04/01/2017     Bone Lab Results  Component Value Date   VD25OH 16.49 (L) 02/22/2020   25OHVITD1 20 (L) 04/01/2017   25OHVITD2 5.4 04/01/2017   25OHVITD3 15 04/01/2017     Inflammation (CRP: Acute Phase) (ESR: Chronic Phase) Lab Results  Component Value Date   CRP 6.0 07/30/2018   ESRSEDRATE  17 07/30/2018       Note: Above Lab results reviewed.  Recent Imaging Review  CT ABDOMEN PELVIS W CONTRAST CLINICAL DATA:  Acute generalized abdominal pain with diarrhea and cramping.  EXAM: CT ABDOMEN AND PELVIS WITH CONTRAST  TECHNIQUE: Multidetector CT imaging of the abdomen and pelvis was performed using the standard protocol following bolus administration of intravenous contrast.  CONTRAST:   169m OMNIPAQUE IOHEXOL 300 MG/ML  SOLN  COMPARISON:  None.  FINDINGS: Lower chest: Very minor dependent basilar atelectasis/hypoventilatory changes. Normal heart size. No pericardial or pleural effusion. Esophagus is nondilated. No hiatal hernia.  Hepatobiliary: Mild decreased hepatic attenuation compatible with hepatic steatosis. No focal hepatic abnormality or intrahepatic biliary dilatation. Gallbladder nondilated. Common bile duct nondistended.  Pancreas: Unremarkable. No pancreatic ductal dilatation or surrounding inflammatory changes.  Spleen: Normal in size without focal abnormality.  Adrenals/Urinary Tract: Normal adrenal glands. No renal obstruction or hydronephrosis. Ureters are symmetric and decompressed. No obstructing ureteral or urinary tract calculus.  Right kidney demonstrates a lower pole nonobstructing radiopaque calculus measuring 12 mm. No focal renal abnormality.  Stomach/Bowel: Nonspecific distention of the stomach. Negative for bowel obstruction, significant dilatation, ileus, or free air. Very small nondilated appendix suspected in the retrocecal area. No acute inflammatory process appreciated.  No free fluid, fluid collection, hemorrhage, hematoma, or abscess.  Vascular/Lymphatic: No significant atherosclerotic process. Mesenteric and renal vasculature all appear patent. Negative for aneurysm or acute vascular process. No veno-occlusive disease. No bulky adenopathy.  Reproductive: Uterus normal in size and anteverted in position. Small ovarian cysts noted bilaterally measuring 2.7 cm on the left and 2.4 cm on the right. No significant pelvic free fluid, fluid collection, hemorrhage, hematoma, or abscess.  Other: No abdominal wall hernia or abnormality. No abdominopelvic ascites.  Musculoskeletal: No acute osseous finding.  IMPRESSION: No acute intra-abdominal or pelvic finding by CT.  Mild hepatic steatosis  Nonobstructing right  nephrolithiasis  Bilateral ovarian cysts, measurements above  Electronically Signed   By: MJerilynn Mages  Shick M.D.   On: 10/18/2019 15:37 Note: Reviewed        Physical Exam  General appearance: Well nourished, well developed, and well hydrated. In no apparent acute distress Mental status: Alert, oriented x 3 (person, place, & time)       Respiratory: No evidence of acute respiratory distress Eyes: PERLA Vitals: BP 124/80   Pulse 73   Temp (!) 97.2 F (36.2 C) (Temporal)   Resp 18   Ht '5\' 3"'$  (1.6 m)   Wt 225 lb (102.1 kg)   LMP 02/12/2020 (Approximate)   SpO2 99%   BMI 39.86 kg/m  BMI: Estimated body mass index is 39.86 kg/m as calculated from the following:   Height as of this encounter: '5\' 3"'$  (1.6 m).   Weight as of this encounter: 225 lb (102.1 kg). Ideal: Ideal body weight: 52.4 kg (115 lb 8.3 oz) Adjusted ideal body weight: 72.3 kg (159 lb 5 oz)  Assessment   Status Diagnosis  Controlled Controlled Controlled 1. Chronic lower extremity cramps (Bilateral) (R>L)   2. Lumbar L1-2 disc protrusion (Right)   3. DDD (degenerative disc disease), lumbosacral   4. Spinal stenosis of lumbosacral region   5. Other intervertebral disc degeneration, lumbar region   6. Difficulty walking   7. Neurogenic urinary incontinence   8. Chronic lower extremity pain (Bilateral)   9. Osteoarthritis of shoulder (Bilateral)   10. Chronic pain syndrome   11. Pharmacologic therapy      Updated Problems: Problem  Neurogenic  Urinary Incontinence  Other Intervertebral Disc Degeneration, Lumbar Region  Spinal Stenosis of Lumbosacral Region    PLAN OF CARE  Chronic Opioid Analgesic:  Hydrocodone/APAP 7.5/325, 1 tab PO QD (PRN) she is using approximately 15 tablets/month  MME/day:3.'75mg'$ /day.  Problem-specific:  No problem-specific Assessment & Plan notes found for this encounter.  [name] has a current medication list which includes the following long-term medication(s): atorvastatin,  baclofen, dexilant, diclofenac sodium, dicyclomine, hydrocodone-acetaminophen, [START ON 03/31/2020] hydrocodone-acetaminophen, [START ON 04/30/2020] hydrocodone-acetaminophen, metoprolol tartrate, [START ON 03/19/2020] pregabalin, and diclofenac sodium. Orders:  Orders Placed This Encounter  Procedures  . Lumbar Transforaminal Epidural    Standing Status:   Future    Standing Expiration Date:   03/31/2020    Scheduling Instructions:     Side: Bilateral     Level: L2     Sedation: With Sedation.     Timeframe: ASAP    Order Specific Question:   Where will this procedure be performed?    Answer:   [facility]  . MR LUMBAR SPINE WO CONTRAST    Patient presents with axial pain with possible radicular component.  In addition to any acute findings, please report on:  1. Facet (Zygapophyseal) joint DJD (Hypertrophy, space narrowing, subchondral sclerosis, and/or osteophyte formation) 2. DDD and/or IVDD (Loss of disc height, desiccation or "Black disc disease") 3. Pars defects 4. Spondylolisthesis, spondylosis, and/or spondyloarthropathies (include Degree/Grade of displacement in mm) 5. Vertebral body Fractures, including age (old, new/acute) 41. Modic Type Changes 7. Demineralization 8. Bone pathology 9. Central, Lateral Recess, and/or Foraminal Stenosis (include AP diameter of stenosis in mm) 10. Surgical changes (hardware type, status, and presence of fibrosis)  NOTE: Please specify level(s) and laterality.    Standing Status:   Future    Standing Expiration Date:   06/01/2020    Order Specific Question:   What is the patient's sedation requirement?    Answer:   No Sedation    Order Specific Question:   Does the patient have a pacemaker or implanted devices?    Answer:   No    Order Specific Question:   Preferred imaging location?    Answer:   [facility] (table limit-350lbs)    Order Specific Question:   Call Results- Best Contact Number?    Answer:   [phone]  ([facility])    Order Specific Question:   Radiology Contrast Protocol - do NOT remove file path    Answer:   [email or web address]  . ToxASSURE Select 13 (MW), Urine    Volume: 30 ml(s). Minimum 3 ml of urine is needed. Document temperature of fresh sample. Indications: Long term (current) use of opiate analgesic (L89.373)    Order Specific Question:   Release to patient    Answer:   Immediate  . Ambulatory referral to Neurosurgery    Referral Priority:   Routine    Referral Type:   Surgical    Referral Reason:   Specialty Services Required    Requested Specialty:   Neurosurgery    Number of Visits Requested:   1    Lab Orders     ToxASSURE Select 13 (MW), Urine  Imaging Orders     MR LUMBAR SPINE WO CONTRAST  Referral Orders     Ambulatory referral to Neurosurgery Pharmacotherapy (Medications Ordered): Meds ordered this encounter  Medications  . HYDROcodone-acetaminophen (NORCO) 7.5-325 MG tablet    Sig: Take 1 tablet by mouth every 6 (six) hours as needed for severe pain.  Must last 90 days    Dispense:  120 tablet    Refill:  0    Chronic Pain: STOP Act (Not applicable) Fill 1 day early if closed on refill date. Do not fill until: 03/01/2020. To last until: 03/31/2020. Avoid benzodiazepines within 8 hours of opioids  . diclofenac Sodium (VOLTAREN) 1 % GEL    Sig: Apply 2 g topically 4 (four) times daily as needed.    Dispense:  350 g    Refill:  PRN  . HYDROcodone-acetaminophen (NORCO) 7.5-325 MG tablet    Sig: Take 1 tablet by mouth every 6 (six) hours as needed for severe pain. Must last 90 days    Dispense:  120 tablet    Refill:  0    Chronic Pain: STOP Act (Not applicable) Fill 1 day early if closed on refill date. Do not fill until: 03/31/2020. To last until: 04/30/2020. Avoid benzodiazepines within 8 hours of opioids  . HYDROcodone-acetaminophen (NORCO) 7.5-325 MG tablet    Sig: Take 1 tablet by mouth every 6 (six) hours as needed for severe  pain. Must last 90 days    Dispense:  120 tablet    Refill:  0    Chronic Pain: STOP Act (Not applicable) Fill 1 day early if closed on refill date. Do not fill until: 04/30/2020. To last until: 05/30/2020. Avoid benzodiazepines within 8 hours of opioids  . predniSONE (DELTASONE) 20 MG tablet    Sig: Take 3 tablets (60 mg total) by mouth daily with breakfast for 3 days, THEN 2 tablets (40 mg total) daily with breakfast for 3 days, THEN 1 tablet (20 mg total) daily with breakfast for 3 days.    Dispense:  18 tablet    Refill:  0  . orphenadrine (NORFLEX) injection 60 mg   Medications administered today: We administered orphenadrine. Problem-specific:  No problem-specific Assessment & Plan notes found for this encounter.  [name] has a current medication list which includes the following long-term medication(s): atorvastatin, baclofen, dexilant, diclofenac sodium, dicyclomine, hydrocodone-acetaminophen, [START ON 03/31/2020] hydrocodone-acetaminophen, [START ON 04/30/2020] hydrocodone-acetaminophen, metoprolol tartrate, [START ON 03/19/2020] pregabalin, and diclofenac sodium. Medications administered: We administered orphenadrine.   Follow-up plan:   Return for Procedure (w/ sedation): (B) L2 TFESI #1, (ASAP).      Interventional treatment options:  Under consideration: Diagnostic/therapeutic bilateral L2 TFESI #1  Neurosurgical referral for decompressive laminectomy and/or discectomy   Therapeutic/palliative (PRN): Diagnostic left SI joint block #2  Diagnostic/therapeutic left IA hip joint injection #2  Diagnostic bilateral lumbar facet block #2  Palliative left trapezius muscle MNB #4 Palliative right trapezius muscle MNB #2 Palliative left L1-2 LESI #2 Palliative right L1-2 LESI #2 Palliativeleft CESI #3 Palliative left greater occipital NB #3  Palliative left C2 + TON NB #2  Palliative left GON + C2 + TON RFA #2 (last done 12/25/2017)    Recent Visits No visits were  found meeting these conditions.  Showing recent visits within past 90 days and meeting all other requirements   Today's Visits Date Type Provider Dept  03/01/20 Office Visit Milinda Pointer, MD [facility]  Showing today's visits and meeting all other requirements   Future Appointments Date Type Provider Dept  03/07/20 Appointment Milinda Pointer, MD [facility]  Showing future appointments within next 90 days and meeting all other requirements    I discussed the assessment and treatment plan with the patient. The patient was provided an opportunity to ask questions and all were answered. The  patient agreed with the plan and demonstrated an understanding of the instructions.  Patient advised to call back or seek an in-person evaluation if the symptoms or condition worsens.  Duration of encounter: 30 minutes.  Note by: Gaspar Cola, MD Date: 03/01/2020; Time: 8:42 PM
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
## 2020-03-01 NOTE — Patient Instructions (Signed)
____________________________________________________________________________________________  Preparing for Procedure with Sedation  Procedure appointments are limited to planned procedures: . No Prescription Refills. . No disability issues will be discussed. . No medication changes will be discussed.  Instructions: . Oral Intake: Do not eat or drink anything for at least 8 hours prior to your procedure. (Exception: Blood Pressure Medication. See below.) . Transportation: Unless otherwise stated by your physician, you may drive yourself after the procedure. . Blood Pressure Medicine: Do not forget to take your blood pressure medicine with a sip of water the morning of the procedure. If your Diastolic (lower reading)is above 100 mmHg, elective cases will be cancelled/rescheduled. . Blood thinners: These will need to be stopped for procedures. Notify our staff if you are taking any blood thinners. Depending on which one you take, there will be specific instructions on how and when to stop it. . Diabetics on insulin: Notify the staff so that you can be scheduled 1st case in the morning. If your diabetes requires high dose insulin, take only  of your normal insulin dose the morning of the procedure and notify the staff that you have done so. . Preventing infections: Shower with an antibacterial soap the morning of your procedure. . Build-up your immune system: Take 1000 mg of Vitamin C with every meal (3 times a day) the day prior to your procedure. . Antibiotics: Inform the staff if you have a condition or reason that requires you to take antibiotics before dental procedures. . Pregnancy: If you are pregnant, call and cancel the procedure. . Sickness: If you have a cold, fever, or any active infections, call and cancel the procedure. . Arrival: You must be in the facility at least 30 minutes prior to your scheduled procedure. . Children: Do not bring children with you. . Dress appropriately:  Bring dark clothing that you would not mind if they get stained. . Valuables: Do not bring any jewelry or valuables.  Reasons to call and reschedule or cancel your procedure: (Following these recommendations will minimize the risk of a serious complication.) . Surgeries: Avoid having procedures within 2 weeks of any surgery. (Avoid for 2 weeks before or after any surgery). . Flu Shots: Avoid having procedures within 2 weeks of a flu shots or . (Avoid for 2 weeks before or after immunizations). . Barium: Avoid having a procedure within 7-10 days after having had a radiological study involving the use of radiological contrast. (Myelograms, Barium swallow or enema study). . Heart attacks: Avoid any elective procedures or surgeries for the initial 6 months after a "Myocardial Infarction" (Heart Attack). . Blood thinners: It is imperative that you stop these medications before procedures. Let us know if you if you take any blood thinner.  . Infection: Avoid procedures during or within two weeks of an infection (including chest colds or gastrointestinal problems). Symptoms associated with infections include: Localized redness, fever, chills, night sweats or profuse sweating, burning sensation when voiding, cough, congestion, stuffiness, runny nose, sore throat, diarrhea, nausea, vomiting, cold or Flu symptoms, recent or current infections. It is specially important if the infection is over the area that we intend to treat. . Heart and lung problems: Symptoms that may suggest an active cardiopulmonary problem include: cough, chest pain, breathing difficulties or shortness of breath, dizziness, ankle swelling, uncontrolled high or unusually low blood pressure, and/or palpitations. If you are experiencing any of these symptoms, cancel your procedure and contact your primary care physician for an evaluation.  Remember:  Regular Business hours are:    Monday to Thursday 8:00 AM to 4:00 PM  Provider's  Schedule: Ethelle Ola, MD:  Procedure days: Tuesday and Thursday 7:30 AM to 4:00 PM  Bilal Lateef, MD:  Procedure days: Monday and Wednesday 7:30 AM to 4:00 PM ____________________________________________________________________________________________   ____________________________________________________________________________________________  General Risks and Possible Complications  Patient Responsibilities: It is important that you read this as it is part of your informed consent. It is our duty to inform you of the risks and possible complications associated with treatments offered to you. It is your responsibility as a patient to read this and to ask questions about anything that is not clear or that you believe was not covered in this document.  Patient's Rights: You have the right to refuse treatment. You also have the right to change your mind, even after initially having agreed to have the treatment done. However, under this last option, if you wait until the last second to change your mind, you may be charged for the materials used up to that point.  Introduction: Medicine is not an exact science. Everything in Medicine, including the lack of treatment(s), carries the potential for danger, harm, or loss (which is by definition: Risk). In Medicine, a complication is a secondary problem, condition, or disease that can aggravate an already existing one. All treatments carry the risk of possible complications. The fact that a side effects or complications occurs, does not imply that the treatment was conducted incorrectly. It must be clearly understood that these can happen even when everything is done following the highest safety standards.  No treatment: You can choose not to proceed with the proposed treatment alternative. The "PRO(s)" would include: avoiding the risk of complications associated with the therapy. The "CON(s)" would include: not getting any of the treatment  benefits. These benefits fall under one of three categories: diagnostic; therapeutic; and/or palliative. Diagnostic benefits include: getting information which can ultimately lead to improvement of the disease or symptom(s). Therapeutic benefits are those associated with the successful treatment of the disease. Finally, palliative benefits are those related to the decrease of the primary symptoms, without necessarily curing the condition (example: decreasing the pain from a flare-up of a chronic condition, such as incurable terminal cancer).  General Risks and Complications: These are associated to most interventional treatments. They can occur alone, or in combination. They fall under one of the following six (6) categories: no benefit or worsening of symptoms; bleeding; infection; nerve damage; allergic reactions; and/or death. 1. No benefits or worsening of symptoms: In Medicine there are no guarantees, only probabilities. No healthcare provider can ever guarantee that a medical treatment will work, they can only state the probability that it may. Furthermore, there is always the possibility that the condition may worsen, either directly, or indirectly, as a consequence of the treatment. 2. Bleeding: This is more common if the patient is taking a blood thinner, either prescription or over the counter (example: Goody Powders, Fish oil, Aspirin, Garlic, etc.), or if suffering a condition associated with impaired coagulation (example: Hemophilia, cirrhosis of the liver, low platelet counts, etc.). However, even if you do not have one on these, it can still happen. If you have any of these conditions, or take one of these drugs, make sure to notify your treating physician. 3. Infection: This is more common in patients with a compromised immune system, either due to disease (example: diabetes, cancer, human immunodeficiency virus [HIV], etc.), or due to medications or treatments (example: therapies used to treat  cancer and   rheumatological diseases). However, even if you do not have one on these, it can still happen. If you have any of these conditions, or take one of these drugs, make sure to notify your treating physician. 4. Nerve Damage: This is more common when the treatment is an invasive one, but it can also happen with the use of medications, such as those used in the treatment of cancer. The damage can occur to small secondary nerves, or to large primary ones, such as those in the spinal cord and brain. This damage may be temporary or permanent and it may lead to impairments that can range from temporary numbness to permanent paralysis and/or brain death. 5. Allergic Reactions: Any time a substance or material comes in contact with our body, there is the possibility of an allergic reaction. These can range from a mild skin rash (contact dermatitis) to a severe systemic reaction (anaphylactic reaction), which can result in death. 6. Death: In general, any medical intervention can result in death, most of the time due to an unforeseen complication. ____________________________________________________________________________________________   

## 2020-03-02 ENCOUNTER — Telehealth: Payer: Self-pay | Admitting: *Deleted

## 2020-03-03 ENCOUNTER — Other Ambulatory Visit: Payer: Self-pay | Admitting: Family Medicine

## 2020-03-03 DIAGNOSIS — E559 Vitamin D deficiency, unspecified: Secondary | ICD-10-CM

## 2020-03-03 DIAGNOSIS — K909 Intestinal malabsorption, unspecified: Secondary | ICD-10-CM

## 2020-03-03 MED ORDER — VITAMIN D (ERGOCALCIFEROL) 1.25 MG (50000 UNIT) PO CAPS: 50000.0000 [IU] | ORAL_CAPSULE | ORAL | 2 refills | Status: DC

## 2020-03-07 ENCOUNTER — Ambulatory Visit: Payer: Self-pay

## 2020-03-07 ENCOUNTER — Encounter: Payer: Self-pay | Admitting: Pain Medicine

## 2020-03-07 ENCOUNTER — Other Ambulatory Visit: Payer: Self-pay

## 2020-03-07 ENCOUNTER — Ambulatory Visit
Admission: RE | Admit: 2020-03-07 | Discharge: 2020-03-07 | Disposition: A | Discharge status: Home / Self Care | Payer: Medicaid Other | Source: Ambulatory Visit | Attending: Pain Medicine | Admitting: Pain Medicine

## 2020-03-07 ENCOUNTER — Ambulatory Visit (HOSPITAL_BASED_OUTPATIENT_CLINIC_OR_DEPARTMENT_OTHER): Payer: Self-pay | Admitting: Pain Medicine

## 2020-03-07 VITALS — BP 109/54 | HR 90 | Temp 98.2°F | Resp 12 | Ht 63.0 in | Wt 225.0 lb

## 2020-03-07 DIAGNOSIS — Z91040 Latex allergy status: Secondary | ICD-10-CM

## 2020-03-07 DIAGNOSIS — M5136 Other intervertebral disc degeneration, lumbar region: Secondary | ICD-10-CM | POA: Insufficient documentation

## 2020-03-07 DIAGNOSIS — M47816 Spondylosis without myelopathy or radiculopathy, lumbar region: Secondary | ICD-10-CM

## 2020-03-07 DIAGNOSIS — M5442 Lumbago with sciatica, left side: Secondary | ICD-10-CM | POA: Insufficient documentation

## 2020-03-07 DIAGNOSIS — R208 Other disturbances of skin sensation: Secondary | ICD-10-CM | POA: Insufficient documentation

## 2020-03-07 DIAGNOSIS — G8929 Other chronic pain: Secondary | ICD-10-CM | POA: Insufficient documentation

## 2020-03-07 DIAGNOSIS — M5416 Radiculopathy, lumbar region: Secondary | ICD-10-CM

## 2020-03-07 DIAGNOSIS — M5126 Other intervertebral disc displacement, lumbar region: Secondary | ICD-10-CM | POA: Insufficient documentation

## 2020-03-07 DIAGNOSIS — Z91041 Radiographic dye allergy status: Secondary | ICD-10-CM

## 2020-03-07 DIAGNOSIS — M5441 Lumbago with sciatica, right side: Secondary | ICD-10-CM | POA: Insufficient documentation

## 2020-03-07 DIAGNOSIS — R55 Syncope and collapse: Secondary | ICD-10-CM | POA: Insufficient documentation

## 2020-03-07 MED ORDER — SODIUM CHLORIDE 0.9% FLUSH
2.0000 mL | Freq: Once | INTRAVENOUS | Status: AC
  Administered 2020-03-07: 2 mL

## 2020-03-07 MED ORDER — MIDAZOLAM HCL 5 MG/5ML IJ SOLN
1.0000 mg | INTRAMUSCULAR | Status: DC | PRN
  Administered 2020-03-07: 2 mg via INTRAVENOUS
  Filled 2020-03-07: qty 5

## 2020-03-07 MED ORDER — FENTANYL CITRATE (PF) 100 MCG/2ML IJ SOLN
25.0000 ug | INTRAMUSCULAR | Status: DC | PRN
  Administered 2020-03-07: 50 ug via INTRAVENOUS
  Filled 2020-03-07: qty 2

## 2020-03-07 MED ORDER — LIDOCAINE HCL 2 % IJ SOLN
20.0000 mL | Freq: Once | INTRAMUSCULAR | Status: AC
  Administered 2020-03-07: 400 mg
  Filled 2020-03-07: qty 40

## 2020-03-07 MED ORDER — GLYCOPYRROLATE 0.2 MG/ML IJ SOLN
0.2000 mg | Freq: Once | INTRAMUSCULAR | Status: AC
  Administered 2020-03-07: 0.2 mg via INTRAVENOUS
  Filled 2020-03-07: qty 1

## 2020-03-07 MED ORDER — DEXAMETHASONE SODIUM PHOSPHATE 10 MG/ML IJ SOLN
20.0000 mg | Freq: Once | INTRAMUSCULAR | Status: AC
  Administered 2020-03-07: 20 mg
  Filled 2020-03-07: qty 2

## 2020-03-07 MED ORDER — ROPIVACAINE HCL 2 MG/ML IJ SOLN
2.0000 mL | Freq: Once | INTRAMUSCULAR | Status: AC
  Administered 2020-03-07: 2 mL via EPIDURAL
  Filled 2020-03-07: qty 10

## 2020-03-07 MED ORDER — LACTATED RINGERS IV SOLN
1000.0000 mL | Freq: Once | INTRAVENOUS | Status: AC
  Administered 2020-03-07: 1000 mL via INTRAVENOUS

## 2020-03-07 NOTE — Progress Notes (Signed)
Safety precautions to be maintained throughout the outpatient stay will include: orient to surroundings, keep bed in low position, maintain call bell within reach at all times, provide assistance with transfer out of bed and ambulation.  

## 2020-03-07 NOTE — Progress Notes (Signed)
PROVIDER NOTE: Information contained herein reflects review and annotations entered in association with encounter. Interpretation of such information and data should be left to medically-trained personnel. Information provided to patient can be located elsewhere in the medical record under "Patient Instructions". Document created using STT-dictation technology, any transcriptional errors that may result from process are unintentional.    Patient: Dana Bradley  Service Category: Procedure  Provider: Gaspar Cola, MD  DOB: 1970/10/19  DOS: 03/07/2020  Location: [facility]  MRN: [id number]  Setting: Ambulatory - outpatient  Referring Provider: Delsa Grana, PA-C  Type: Established Patient  Specialty: Interventional Pain Management  PCP: Delsa Grana, PA-C   Primary Reason for Visit: Interventional Pain Management Treatment. CC: Back Pain (biteral)  Procedure:          Anesthesia, Analgesia, Anxiolysis:  Type: Trans-Foraminal Epidural Steroid Injection #1  Purpose: Diagnostic/Therapeutic Region: Posterolateral Lumbosacral Target Area: The 6 o'clock position under the pedicle, on the affected side. Approach: Posterior Percutaneous Paravertebral approach. Level: L2 Level Laterality: Bilateral Paravertebral  Type: Moderate (Conscious) Sedation combined with Local Anesthesia Indication(s): Analgesia and Anxiety Route: Intravenous (IV) IV Access: Secured Sedation: Meaningful verbal contact was maintained at all times during the procedure  Local Anesthetic: Lidocaine 1-2%  Position: Prone   Indications: 1. DDD (degenerative disc disease), lumbar   2. Lumbar L1-2 disc protrusion (Right)   3. Lumbar spondylosis   4. Chronic low back pain (Primary Area of Pain) (Bilateral) (R>L) (midline)   5. Lumbar radiculitis (Left)   6. Lumbar radiculitis (Right)    History of allergy to radiographic contrast media    Latex precautions, history of latex allergy    History of vasovagal  episode    Pain Score: Pre-procedure: 6 /10 Post-procedure: 6 /10 I am having some concerns about this patient's ability to understand the importance of the pain score and what that means in terms of our ability to make decisions about her treatment.  Today the patient came in and reported her pain to be a 6/10 upon admission.  The patient had an IV started and she received IV fentanyl, IV midazolam, and glycopyrrolate.  The procedure was done using a significant amount of local anesthetic in the skin, muscles, and this was then followed by the bilateral L2 transforaminal ESI.  The discharge nurse indicated that before the patient left, she questioned her at length regarding her postprocedure pain score and the patient kept insisting that her pain was a 6/10.  This meant absolutely no change in her symptoms from admission to discharge.  However further questioning revealed that the patient no longer had any lower extremity pain, but she claimed that her back was still hurting, despite all the local anesthetic infiltrated into the area, as well as the bilateral L2 nerve root block.  Clearly, if her leg pain was gone, then her pain score should have at least changed.  However, this is not what we saw.  In addition, the nurse taking the information indicated that the patient rather than answering the detailed questions about the areas that hurt in those that did not, she simply kept on repeating that her pain score was a 6/10.  Given the patient the benefit of the doubt, it is entirely possible that we are dealing with some type of disc herniation affecting the L1/L2 levels.  Been intraspinal, there is the possibility that it would explain the altered response being described by the patient.  The patient is pending an MRI of the lumbar  spine on 03/15/2020 which would hopefully shed some ligh into the etiology of her current pain.  Note: Today the patient indicated that laying down on her stomach usually increases  her pain.  Therefore, we make sure to place some pillows under her abdominal area so as to optimize the curve of her lumbar spine and also provide her with some sedation as soon as she had all the monitors in place and was in the procedure table.  In addition to this, today I noticed that she has very little tolerance for pain and seems to be having some hyperalgesia.  Infiltration of the local anesthetics into the area of the paravertebral muscles triggered referred pain towards the pelvic area.  The patient also seems to be extremely anxious and despite the fact that I was explaining everything as I was doing it, she kept on asking about different sensations.  After I had injected the local anesthetic and Decadron into the area of the L2 nerve roots, she indicated experiencing a weird burning sensation in the perineal area.  Of course, 10 to 15 seconds later this was gone and I am not entirely sure that he had anything to do with the procedure itself since from the neuroanatomical standpoint was not really working on an area that should have triggered that type of sensation.  However, we still do not have her lumbar MRI which is scheduled for 03/15/2020, which is likely to shed some light into what is going on with her case.  She did get some partial benefit from the steroid pack, but clearly because she did not get complete relief, today we have moved on to the transforaminal epidural steroid injection, as planned.  Pre-op Assessment:  Dana Bradley is a 49 y.o. (year old), female patient, seen today for interventional treatment. She  has a past surgical history that includes Ablation; Tubal ligation (10/01/99); Cardiac catheterization; Knee arthroscopy; Colonoscopy with propofol (N/A, 05/17/2015); Esophagogastroduodenoscopy (N/A, 05/17/2015); spg; and Tibial Tubercle Bypass (Right, 1998). Dana Bradley has a current medication list which includes the following prescription(s): atorvastatin, baclofen,  butalbital-acetaminophen-caffeine, vitamin b-12, dexilant, diclofenac sodium, dicyclomine, hydrocodone-acetaminophen, [START ON 03/31/2020] hydrocodone-acetaminophen, [START ON 04/30/2020] hydrocodone-acetaminophen, magnesium oxide, melatonin, metoprolol tartrate, mupirocin ointment, ondansetron, prednisone, [START ON 03/19/2020] pregabalin, valerian, vitamin d (ergocalciferol), and diclofenac sodium, and the following Facility-Administered Medications: fentanyl and midazolam. Her primarily concern today is the Back Pain (biteral)  Initial Vital Signs:  Pulse/HCG Rate: 90ECG Heart Rate: 85 Temp: 98.2 F (36.8 C) Resp: 16 BP: 121/81 SpO2: 99 %  BMI: Estimated body mass index is 39.86 kg/m as calculated from the following:   Height as of this encounter: 5\' 3"  (1.6 m).   Weight as of this encounter: 225 lb (102.1 kg).  Risk Assessment: Allergies: Reviewed. She is allergic to aspirin; cymbalta [duloxetine hcl]; depakote [divalproex sodium]; gadolinium derivatives; haloperidol; meperidine; reglan [metoclopramide]; tramadol hcl; trazodone; compazine [prochlorperazine]; meloxicam; penicillins; tomato; other; shellfish allergy; shellfish-derived products; bacitracin-neomycin-polymyxin; cephalosporins; ibuprofen; latex; neosporin [neomycin-bacitracin zn-polymyx]; nsaids; sulfa antibiotics; and sulfonamide derivatives.  Allergy Precautions: None required Coagulopathies: Reviewed. None identified.  Blood-thinner therapy: None at this time Active Infection(s): Reviewed. None identified. Ms. Lacks is afebrile  Site Confirmation: Ms. Totten was asked to confirm the procedure and laterality before marking the site Procedure checklist: Completed Consent: Before the procedure and under the influence of no sedative(s), amnesic(s), or anxiolytics, the patient was informed of the treatment options, risks and possible complications. To fulfill our ethical and legal obligations, as recommended by the American  Medical  Association's Code of Ethics, I have informed the patient of my clinical impression; the nature and purpose of the treatment or procedure; the risks, benefits, and possible complications of the intervention; the alternatives, including doing nothing; the risk(s) and benefit(s) of the alternative treatment(s) or procedure(s); and the risk(s) and benefit(s) of doing nothing. The patient was provided information about the general risks and possible complications associated with the procedure. These may include, but are not limited to: failure to achieve desired goals, infection, bleeding, organ or nerve damage, allergic reactions, paralysis, and death. In addition, the patient was informed of those risks and complications associated to Spine-related procedures, such as failure to decrease pain; infection (i.e.: Meningitis, epidural or intraspinal abscess); bleeding (i.e.: epidural hematoma, subarachnoid hemorrhage, or any other type of intraspinal or peri-dural bleeding); organ or nerve damage (i.e.: Any type of peripheral nerve, nerve root, or spinal cord injury) with subsequent damage to sensory, motor, and/or autonomic systems, resulting in permanent pain, numbness, and/or weakness of one or several areas of the body; allergic reactions; (i.e.: anaphylactic reaction); and/or death. Furthermore, the patient was informed of those risks and complications associated with the medications. These include, but are not limited to: allergic reactions (i.e.: anaphylactic or anaphylactoid reaction(s)); adrenal axis suppression; blood sugar elevation that in diabetics may result in ketoacidosis or comma; water retention that in patients with history of congestive heart failure may result in shortness of breath, pulmonary edema, and decompensation with resultant heart failure; weight gain; swelling or edema; medication-induced neural toxicity; particulate matter embolism and blood vessel occlusion with resultant organ, and/or  nervous system infarction; and/or aseptic necrosis of one or more joints. Finally, the patient was informed that Medicine is not an exact science; therefore, there is also the possibility of unforeseen or unpredictable risks and/or possible complications that may result in a catastrophic outcome. The patient indicated having understood very clearly. We have given the patient no guarantees and we have made no promises. Enough time was given to the patient to ask questions, all of which were answered to the patient's satisfaction. Ms. Minch has indicated that she wanted to continue with the procedure. Attestation: I, the ordering provider, attest that I have discussed with the patient the benefits, risks, side-effects, alternatives, likelihood of achieving goals, and potential problems during recovery for the procedure that I have provided informed consent. Date  Time: 03/07/2020  8:14 AM  Pre-Procedure Preparation:  Monitoring: As per clinic protocol. Respiration, ETCO2, SpO2, BP, heart rate and rhythm monitor placed and checked for adequate function Safety Precautions: Patient was assessed for positional comfort and pressure points before starting the procedure. Time-out: I initiated and conducted the "Time-out" before starting the procedure, as per protocol. The patient was asked to participate by confirming the accuracy of the "Time Out" information. Verification of the correct person, site, and procedure were performed and confirmed by me, the nursing staff, and the patient. "Time-out" conducted as per Joint Commission's Universal Protocol (UP.01.01.01). Time: 0846  Description of Procedure:          Area Prepped: Entire Posterior Lumbosacral Area DuraPrep (Iodine Povacrylex [0.7% available iodine] and Isopropyl Alcohol, 74% w/w) Safety Precautions: Aspiration looking for blood return was conducted prior to all injections. At no point did we inject any substances, as a needle was being advanced. No  attempts were made at seeking any paresthesias. Safe injection practices and needle disposal techniques used. Medications properly checked for expiration dates. SDV (single dose vial) medications used. Description of the  Procedure: Protocol guidelines were followed. The patient was placed in position over the procedure table. The target area was identified and the area prepped in the usual manner. Skin & deeper tissues infiltrated with local anesthetic. Appropriate amount of time allowed to pass for local anesthetics to take effect. The procedure needles were then advanced to the target area. Proper needle placement secured. Negative aspiration confirmed. Solution injected in intermittent fashion, asking for systemic symptoms every 0.5cc of injectate. The needles were then removed and the area cleansed, making sure to leave some of the prepping solution back to take advantage of its long term bactericidal properties.  Vitals:   03/07/20 0852 03/07/20 0902 03/07/20 0912 03/07/20 0922  BP: 125/79 106/62 113/61 (!) 109/54  Pulse:      Resp: 15 11 15 12   Temp:  98 F (36.7 C)  98.2 F (36.8 C)  SpO2: 99% 98% 99% 99%  Weight:      Height:        Start Time: 0846 hrs. End Time: 0852 hrs.  Materials:  Needle(s) Type: Spinal Needle Gauge: 22G Length: 5-in Medication(s): Please see orders for medications and dosing details.  Imaging Guidance (Spinal):          Type of Imaging Technique: Fluoroscopy Guidance (Spinal) Indication(s): Assistance in needle guidance and placement for procedures requiring needle placement in or near specific anatomical locations not easily accessible without such assistance. Exposure Time: Please see nurses notes. Contrast: None used. Fluoroscopic Guidance: I was personally present during the use of fluoroscopy. "Tunnel Vision Technique" used to obtain the best possible view of the target area. Parallax error corrected before commencing the procedure.  "Direction-depth-direction" technique used to introduce the needle under continuous pulsed fluoroscopy. Once target was reached, antero-posterior, oblique, and lateral fluoroscopic projection used confirm needle placement in all planes. Images permanently stored in EMR. Interpretation: No contrast injected. I personally interpreted the imaging intraoperatively. Adequate needle placement confirmed in multiple planes. Permanent images saved into the patient's record.  Antibiotic Prophylaxis:   Anti-infectives (From admission, onward)   None     Indication(s): None identified  Post-operative Assessment:  Post-procedure Vital Signs:  Pulse/HCG Rate: 9082 Temp: 98.2 F (36.8 C) Resp: 12 BP: (!) 109/54 SpO2: 99 %  EBL: None  Complications: No immediate post-treatment complications observed by team, or reported by patient.  Note: The patient tolerated the entire procedure well. A repeat set of vitals were taken after the procedure and the patient was kept under observation following institutional policy, for this type of procedure. Post-procedural neurological assessment was performed, showing return to baseline, prior to discharge. The patient was provided with post-procedure discharge instructions, including a section on how to identify potential problems. Should any problems arise concerning this procedure, the patient was given instructions to immediately contact us, at any time, without hesitation. In any case, we plan to contact the patient by telephone for a follow-up status report regarding this interventional procedure.  Comments:  No additional relevant information.  Plan of Care  Orders:  Orders Placed This Encounter  Procedures  . Lumbar Transforaminal Epidural    Scheduling Instructions:     Side: Bilateral     Level: L2     Sedation: With Sedation.     Timeframe: Today    Order Specific Question:   Where will this procedure be performed?    Answer:   [facility]   . [facility] C-ARM 1-60 MIN NO REPORT    Intraoperative interpretation by  procedural physician at [facility].    Standing Status:   Standing    Number of Occurrences:   1    Order Specific Question:   Reason for exam:    Answer:   Assistance in needle guidance and placement for procedures requiring needle placement in or near specific anatomical locations not easily accessible without such assistance.  . Informed Consent Details: Physician/Practitioner Attestation; Transcribe to consent form and obtain patient signature    Provider Attestation: I, [place] Dossie Arbour, MD, (Pain Management Specialist), the physician/practitioner, attest that I have discussed with the patient the benefits, risks, side effects, alternatives, likelihood of achieving goals and potential problems during recovery for the procedure that I have provided informed consent.    Scheduling Instructions:     Procedure: Diagnostic lumbar transforaminal epidural steroid injection under fluoroscopic guidance. (See notes for level and laterality.)     Indication/Reason: Lumbar radiculopathy/radiculitis associated with lumbar stenosis     Note: Always confirm laterality of pain with Ms. Roane, before procedure.     Transcribe to consent form and obtain patient signature.  . Provide equipment / supplies at bedside    Equipment required: Single use, disposable, "Block Tray"    Standing Status:   Standing    Number of Occurrences:   1    Order Specific Question:   Specify    Answer:   Block Tray  . Miscellanous precautions    Standing Status:   Standing    Number of Occurrences:   1  . Latex precautions    Activate Latex-Free Protocol.    Standing Status:   Standing    Number of Occurrences:   1   Chronic Opioid Analgesic:  Hydrocodone/APAP 7.5/325, 1 tab PO q 6 hrs MME/day:3.75mg /day.   Medications ordered for procedure: Meds ordered this encounter  Medications  . lidocaine (XYLOCAINE) 2 % (with pres)  injection 400 mg  . lactated ringers infusion 1,000 mL  . midazolam (VERSED) 5 MG/5ML injection 1-2 mg    Make sure Flumazenil is available in the pyxis when using this medication. If oversedation occurs, administer 0.2 mg IV over 15 sec. If after 45 sec no response, administer 0.2 mg again over 1 min; may repeat at 1 min intervals; not to exceed 4 doses (1 mg)  . fentaNYL (SUBLIMAZE) injection 25-50 mcg    Make sure Narcan is available in the pyxis when using this medication. In the event of respiratory depression (RR< 8/min): Titrate NARCAN (naloxone) in increments of 0.1 to 0.2 mg IV at 2-3 minute intervals, until desired degree of reversal.  . glycopyrrolate (ROBINUL) injection 0.2 mg  . sodium chloride flush (NS) 0.9 % injection 2 mL  . ropivacaine (PF) 2 mg/mL (0.2%) (NAROPIN) injection 2 mL  . dexamethasone (DECADRON) injection 20 mg   Medications administered: We administered lidocaine, lactated ringers, midazolam, fentaNYL, glycopyrrolate, sodium chloride flush, ropivacaine (PF) 2 mg/mL (0.2%), and dexamethasone.  See the medical record for exact dosing, route, and time of administration.  Follow-up plan:   Return in about 2 weeks (around 03/21/2020) for (VV), (PP).       Interventional treatment options:  Under consideration: Diagnostic/therapeutic bilateral L2 TFESI #1  Neurosurgical referral for decompressive laminectomy and/or discectomy   Therapeutic/palliative (PRN): Diagnostic left SI joint block #2  Diagnostic/therapeutic left IA hip joint injection #2  Diagnostic bilateral lumbar facet block #2  Palliative left trapezius muscle MNB #4 Palliative right trapezius muscle MNB #2 Palliative left L1-2 LESI #2 Palliative right  L1-2 LESI #2 Palliativeleft CESI #3 Palliative left greater occipital NB #3  Palliative left C2 + TON NB #2  Palliative left GON + C2 + TON RFA #2 (last done 12/25/2017)     Recent Visits Date Type Provider Dept  03/01/20 Office Visit  Milinda Pointer, MD [facility]  Showing recent visits within past 90 days and meeting all other requirements   Today's Visits Date Type Provider Dept  03/07/20 Procedure visit Milinda Pointer, MD [facility]  Showing today's visits and meeting all other requirements   Future Appointments Date Type Provider Dept  03/22/20 Appointment Milinda Pointer, MD [facility]  Showing future appointments within next 90 days and meeting all other requirements   Disposition: Discharge home  Discharge (Date  Time): 03/07/2020; 0923 hrs.   Primary Care Physician: Delsa Grana, PA-C Location: [facility] Note by: Gaspar Cola, MD Date: 03/07/2020; Time: 9:57 AM  Disclaimer:  Medicine is not an Chief Strategy Officer. The only guarantee in medicine is that nothing is guaranteed. It is important to note that the decision to proceed with this intervention was based on the information collected from the patient. The Data and conclusions were drawn from the patient's questionnaire, the interview, and the physical examination. Because the information was provided in large part by the patient, it cannot be guaranteed that it has not been purposely or unconsciously manipulated. Every effort has been made to obtain as much relevant data as possible for this evaluation. It is important to note that the conclusions that lead to this procedure are derived in large part from the available data. Always take into account that the treatment will also be dependent on availability of resources and existing treatment guidelines, considered by other Pain Management Practitioners as being common knowledge and practice, at the time of the intervention. For Medico-Legal purposes, it is also important to point out that variation in procedural techniques and pharmacological choices are the acceptable norm. The indications, contraindications, technique, and results of the  above procedure should only be interpreted and judged by a Board-Certified Interventional Pain Specialist with extensive familiarity and expertise in the same exact procedure and technique.

## 2020-03-07 NOTE — Patient Instructions (Signed)

## 2020-03-08 ENCOUNTER — Telehealth: Payer: Self-pay | Admitting: *Deleted

## 2020-03-08 ENCOUNTER — Ambulatory Visit (INDEPENDENT_AMBULATORY_CARE_PROVIDER_SITE_OTHER): Payer: Self-pay | Admitting: Gastroenterology

## 2020-03-08 ENCOUNTER — Other Ambulatory Visit: Payer: Self-pay | Admitting: Physical Medicine and Rehabilitation

## 2020-03-08 ENCOUNTER — Other Ambulatory Visit: Payer: Self-pay

## 2020-03-08 VITALS — BP 130/80 | HR 90 | Temp 98.2°F | Ht 63.0 in | Wt 218.0 lb

## 2020-03-08 DIAGNOSIS — R197 Diarrhea, unspecified: Secondary | ICD-10-CM

## 2020-03-08 DIAGNOSIS — G43001 Migraine without aura, not intractable, with status migrainosus: Secondary | ICD-10-CM

## 2020-03-08 DIAGNOSIS — R109 Unspecified abdominal pain: Secondary | ICD-10-CM

## 2020-03-08 MED ORDER — ONDANSETRON HCL 4 MG PO TABS: 4.0000 mg | ORAL_TABLET | Freq: Three times a day (TID) | ORAL | 2 refills | Status: DC | PRN

## 2020-03-08 MED ORDER — OMEPRAZOLE 40 MG PO CPDR
40.0000 mg | DELAYED_RELEASE_CAPSULE | Freq: Every day | ORAL | 0 refills | Status: DC
Start: 2020-03-08 — End: 2020-06-06

## 2020-03-08 MED ORDER — RIFAXIMIN 550 MG PO TABS
550.0000 mg | ORAL_TABLET | Freq: Three times a day (TID) | ORAL | 0 refills | Status: AC
Start: 2020-03-08 — End: 2020-03-22

## 2020-03-08 MED ORDER — DICYCLOMINE HCL 10 MG PO CAPS: 10.0000 mg | ORAL_CAPSULE | Freq: Three times a day (TID) | ORAL | 1 refills | Status: DC

## 2020-03-08 NOTE — Progress Notes (Signed)
Jonathon Bellows MD, MRCP(U.K) [address]  [place]  [place], [place] [address]  Main: [phone]  Fax: [phone]   Primary Care Physician: Delsa Grana, PA-C  Primary Gastroenterologist:  Dr. Jonathon Bellows   Diarrhea follow-up  HPI: Dana Bradley is a 49 y.o. female     Summary of history : Initially referred and seen on 11/10/2019 for abdominal pain and diarrhea. She was previously a patient of [facility] gastroenterology.   Known to have internal hemorrhoids per her last colonoscopy in 2016,  acid reflux with esophagitis in 2016 per her endoscopy.    She was seen back in December 2020 by her family nurse practitioner for abdominal pain.  Ongoing since August along with diarrhea.She says that she has been having the diarrhea since August 2020 got better for a while and then got worse.  She has a longstanding history of irritable bowel syndrome with diarrhea.    She consumes some sweet and low in her drinks daily.  She is up to 2 or 3 bowel movements which are very runny sticky and smelly each day.  Sometimes appear to be pale-colored.  She takes Dexilant for her acid reflux.  She says she has generalized abdominal pain on and off crampy in nature associated with watery diarrhea and relieved after bowel movement.  Stress test make it worse.  10/18/2019 CT scan of the abdomen and pelvis with contrast demonstrates mild hepatic steatosis but no acute intra-abdominal pathology.  09/21/2019: CBC: Normal: Acute hepatitis panel negative.  CMP normal.  November 2020 GI PCR negative.  Interval history 11/10/2019-03/08/2020  12/07/2019: H. pylori breath test, celiac serology negative.  Stool for C. difficile as well as fecal calprotectin negative.  The Creon seem to have helped and was commenced on the same.  Commenced on dicyclomine at the same time.  Although Creon helped she could get a prescription due to the cost.  Continues to spend a long.  Of time daily using the restroom per day.  This is  frustrating her.  Occasional blood per stool. Current Outpatient Medications  Medication Sig Dispense Refill  . atorvastatin (LIPITOR) 40 MG tablet Take 1 tablet (40 mg total) by mouth at bedtime. 90 tablet 3  . baclofen (LIORESAL) 10 MG tablet Take 1 tablet (10 mg total) by mouth 3 (three) times daily. 90 tablet 5  . butalbital-acetaminophen-caffeine (FIORICET) 50-325-40 MG tablet Take 1-2 tablets by mouth every 6 (six) hours as needed for headache. 20 tablet 0  . Cyanocobalamin (VITAMIN B-12) 500 MCG SUBL Place 1,500 mcg under the tongue daily.  150 tablet   . Dexlansoprazole (DEXILANT) 30 MG capsule Take 1 capsule (30 mg total) by mouth daily. This replaces omeprazole 30 capsule 2  . diclofenac sodium (VOLTAREN) 1 % GEL Apply 2 g topically 4 (four) times daily. (Patient taking differently: Apply 2 g topically 4 (four) times daily. As needed.) 350 g 3  . diclofenac Sodium (VOLTAREN) 1 % GEL Apply 2 g topically 4 (four) times daily as needed. 350 g PRN  . dicyclomine (BENTYL) 10 MG capsule Take 1 capsule (10 mg total) by mouth 3 (three) times daily as needed. As needed. 90 capsule 2  . HYDROcodone-acetaminophen (NORCO) 7.5-325 MG tablet Take 1 tablet by mouth every 6 (six) hours as needed for severe pain. Must last 90 days 120 tablet 0  . [START ON 03/31/2020] HYDROcodone-acetaminophen (NORCO) 7.5-325 MG tablet Take 1 tablet by mouth every 6 (six) hours as needed for severe pain. Must last  90 days 120 tablet 0  . [START ON 04/30/2020] HYDROcodone-acetaminophen (NORCO) 7.5-325 MG tablet Take 1 tablet by mouth every 6 (six) hours as needed for severe pain. Must last 90 days 120 tablet 0  . magnesium oxide (MAG-OX) 400 MG tablet Take 300 mg by mouth daily.     . Melatonin 10 MG TABS Take 10 mg by mouth at bedtime.    . metoprolol tartrate (LOPRESSOR) 50 MG tablet Take 1 tablet (50 mg total) by mouth 2 (two) times daily. 180 tablet 3  . mupirocin ointment (BACTROBAN) 2 % Apply 1 application topically 2  (two) times daily as needed. 22 g 0  . ondansetron (ZOFRAN) 4 MG tablet Take 1 tablet (4 mg total) by mouth every 8 (eight) hours as needed. 20 tablet 2  . predniSONE (DELTASONE) 20 MG tablet Take 3 tablets (60 mg total) by mouth daily with breakfast for 3 days, THEN 2 tablets (40 mg total) daily with breakfast for 3 days, THEN 1 tablet (20 mg total) daily with breakfast for 3 days. 18 tablet 0  . [START ON 03/19/2020] pregabalin (LYRICA) 150 MG capsule Take 1 capsule (150 mg total) by mouth every 8 (eight) hours. 270 capsule 1  . VALERIAN PO Take by mouth as needed. Makes Valerian tea about 3-4 times per week.    . Vitamin D, Ergocalciferol, (DRISDOL) 1.25 MG (50000 UNIT) CAPS capsule Take 1 capsule (50,000 Units total) by mouth 2 (two) times a week. x12 weeks. 24 capsule 2   No current facility-administered medications for this visit.    Allergies as of 03/08/2020 - Review Complete 03/07/2020  Allergen Reaction Noted  . Aspirin Swelling 06/13/2008  . Cymbalta [duloxetine hcl] Other (See Comments) 09/09/2016  . Depakote [divalproex sodium] Shortness Of Breath 12/10/2012  . Gadolinium derivatives  07/20/2019  . Haloperidol Shortness Of Breath 05/15/2015  . Meperidine Nausea And Vomiting 12/10/2012  . Reglan [metoclopramide] Shortness Of Breath 09/16/2016  . Tramadol hcl Palpitations 09/09/2016  . Trazodone Shortness Of Breath 05/15/2015  . Compazine [prochlorperazine] Other (See Comments) 09/16/2016  . Meloxicam Other (See Comments) 03/17/2014  . Penicillins Rash 06/13/2008  . Tomato Hives 04/29/2016  . Other  03/17/2014  . Shellfish allergy  03/17/2014  . Shellfish-derived products Other (See Comments) 01/23/2015  . Bacitracin-neomycin-polymyxin Rash 05/15/2015  . Cephalosporins Rash 05/15/2015  . Ibuprofen Other (See Comments) and Rash 01/05/2013  . Latex Itching 03/17/2014  . Neosporin [neomycin-bacitracin zn-polymyx] Rash 12/10/2012  . Nsaids Other (See Comments) 03/17/2014  .  Sulfa antibiotics Rash 03/17/2014  . Sulfonamide derivatives Rash 06/13/2008    ROS:  General: Negative for anorexia, weight loss, fever, chills, fatigue, weakness. ENT: Negative for hoarseness, difficulty swallowing , nasal congestion. CV: Negative for chest pain, angina, palpitations, dyspnea on exertion, peripheral edema.  Respiratory: Negative for dyspnea at rest, dyspnea on exertion, cough, sputum, wheezing.  GI: See history of present illness. GU:  Negative for dysuria, hematuria, urinary incontinence, urinary frequency, nocturnal urination.  Endo: Negative for unusual weight change.    Physical Examination:   LMP 02/12/2020 (Approximate)   General: Well-nourished, well-developed in no acute distress.  Eyes: No icterus. Conjunctivae pink. Mouth: Oropharyngeal mucosa moist and pink , no lesions erythema or exudate. Neuro: Alert and oriented x 3.  Grossly intact. Skin: Warm and dry, no jaundice.   Psych: Alert and cooperative, normal mood and affect.   Imaging Studies: DG PAIN CLINIC C-ARM 1-60 MIN NO REPORT  Result Date: 03/07/2020 Fluoro was used, but no Radiologist  interpretation will be provided. Please refer to "NOTES" tab for provider progress note.   Assessment and Plan:   Maliaka Brasington is a 49 y.o. y/o female here to follow-up for abdominal pain and diarrhea.  Likely a combination of gas bloating and irritable bowel syndrome with diarrhea.  She is on Bentyl which helps her which she takes up to 4 times a day.  In addition commenced on Creon which has helped.  Plan 1.     Samples for Creon as she not able to obtain the same from the company due to cost. 2.  Trial of Xifaxan for IBS-D for 14 days 3.  Colonoscopy to rule out microscopic colitis.  Bowel prep sample provided 4. .  Refill of Bentyl for 90 days  I have discussed alternative options, risks & benefits,  which include, but are not limited to, bleeding, infection, perforation,respiratory complication & drug  reaction.  The patient agrees with this plan & written consent will be obtained.      Dr Jonathon Bellows  MD,MRCP [facility]) Follow up in 6 weeks

## 2020-03-08 NOTE — Addendum Note (Signed)
Addended by: Dorethea Clan on: 03/08/2020 10:53 AM   Modules accepted: Orders

## 2020-03-08 NOTE — Telephone Encounter (Signed)
No problems post procedure. 

## 2020-03-09 ENCOUNTER — Telehealth: Payer: Self-pay | Admitting: Pain Medicine

## 2020-03-09 NOTE — Telephone Encounter (Signed)
Patient wants to know if she should start the prenisone dose pack now or should she give procedure time to work, is it safe to have procedure and take this med?

## 2020-03-09 NOTE — Telephone Encounter (Signed)
Per Dr. Dossie Arbour. Do not start Steroid. Please hold on to the prescription but do not start without calling us. Patient called and she understands instructions.

## 2020-03-13 ENCOUNTER — Other Ambulatory Visit: Payer: Self-pay

## 2020-03-13 ENCOUNTER — Other Ambulatory Visit: Payer: Self-pay | Admitting: Physical Medicine and Rehabilitation

## 2020-03-13 ENCOUNTER — Encounter: Payer: Self-pay | Attending: Physical Medicine and Rehabilitation | Admitting: Physical Medicine and Rehabilitation

## 2020-03-13 ENCOUNTER — Encounter: Payer: Self-pay | Admitting: Physical Medicine and Rehabilitation

## 2020-03-13 VITALS — BP 130/80 | HR 90 | Temp 98.2°F | Ht 63.0 in | Wt 218.0 lb

## 2020-03-13 DIAGNOSIS — M7918 Myalgia, other site: Secondary | ICD-10-CM | POA: Insufficient documentation

## 2020-03-13 DIAGNOSIS — G43119 Migraine with aura, intractable, without status migrainosus: Secondary | ICD-10-CM | POA: Insufficient documentation

## 2020-03-13 DIAGNOSIS — G43001 Migraine without aura, not intractable, with status migrainosus: Secondary | ICD-10-CM

## 2020-03-13 DIAGNOSIS — Z6838 Body mass index (BMI) 38.0-38.9, adult: Secondary | ICD-10-CM | POA: Insufficient documentation

## 2020-03-13 DIAGNOSIS — M797 Fibromyalgia: Secondary | ICD-10-CM | POA: Insufficient documentation

## 2020-03-13 MED ORDER — BUTALBITAL-APAP-CAFFEINE 50-325-40 MG PO TABS: 1.0000 | ORAL_TABLET | Freq: Four times a day (QID) | ORAL | 0 refills | Status: DC | PRN

## 2020-03-13 MED ORDER — DIHYDROERGOTAMINE MESYLATE 4 MG/ML NA SOLN: 1.0000 | NASAL | 12 refills | Status: DC | PRN

## 2020-03-13 NOTE — Progress Notes (Signed)
Subjective:    Patient ID: Dana Bradley, female    DOB: 08-05-1971, 49 y.o.   MRN: [id number]  HPI Due to national recommendations of social distancing because of COVID 62, an audio/video tele-health visit is felt to be the most appropriate encounter for this patient at this time. See MyChart message from today for the patient's consent to a tele-health encounter with [place]. This is a follow up tele-visit via phone. The patient is at home. MD is at office.   [name] reports that Botox is not helping the migraines.  Her last migraine occurred Sunday 03/05/20 and lasted until Thursday 03/09/20. She is starting to get another migraine right now. The Fioricet does help. It reduces the pain but does not make the migraine completely go away. Pain is on average 7/10 and is currently 4/10. She has 9 Fioricet left. She takes the Fioricet every 10 hours when she has a migraine. She has been taking Zofran for nausea. She has tried Elavil, Triptans, trigger point injections, RF treatment, Botox. Aimovig, Ajovy, Emgality, physical therapy. Hydrocodone is not as effective. Her occipital nerve feels like it is on fire. She has had an occipital nerve block. She has tried occipital nerve block which provided temporary relief. She minimizes sunlight and wears sunlight pretty much all the time. She has had Dana Bradley imaging that was all negative. She is allergic to contrast. This is really impacting her quality of life.   Her daughter comes into her room to read to her. She has pseudotumor cerebri and she would like her to be seen here.   Pain Inventory Average Pain 7 Pain Right Now 4 My pain is intermittent and pulsating pain on left side of pain and burns along the nerve into her eye and left arm   In the last 24 hours, has pain interfered with the following? General activity 10 Relation with others 7 Enjoyment of life 5 What TIME of day is your pain at its worst? varies Sleep  (in general) Poor  Pain is worse with: some activites and smells and light make it worse Pain improves with: medication and being in the dark makes it tolerable Relief from Meds: 4  Mobility walk without assistance  Function I need assistance with the following:  dressing, bathing, meal prep, household duties and shopping  Neuro/Psych bladder control problems bowel control problems weakness numbness tingling trouble walking dizziness depression  Prior Studies Any changes since last visit?  yes saw Dr Dossie Arbour for back injection on 03/07/20  Physicians involved in your care Dr Dossie Arbour - pain management  (back pain)   Family History  Problem Relation Age of Onset  . Depression Mother   . Hypertension Mother   . Cancer Mother        Skin  . Hyperlipidemia Mother   . Anxiety disorder Mother   . Migraines Mother   . Alcohol abuse Father   . Depression Father   . Stroke Father   . Heart disease Father   . Hypertension Father   . Anxiety disorder Father   . Depression Sister   . Hyperlipidemia Sister   . Diabetes Sister   . Hypertension Sister   . Polycystic ovary syndrome Sister   . Bipolar disorder Sister   . Anxiety disorder Sister   . Migraines Sister   . Cancer Maternal Grandmother 59       Breast  . Thyroid disease Maternal Grandmother   . Arthritis Maternal Grandmother   .  Hyperlipidemia Maternal Grandmother   . Depression Sister   . Hypertension Sister   . Anxiety disorder Sister   . Migraines Sister   . Alzheimer's disease Other   . Aneurysm Maternal Grandfather   . Hypertension Maternal Grandfather   . Heart disease Maternal Grandfather   . Alzheimer's disease Paternal Grandmother   . Heart attack Paternal Grandfather   . Hypertension Paternal Grandfather   . COPD Paternal Grandfather   . Heart disease Paternal Grandfather   . Migraines Son   . Migraines Daughter   . Migraines Daughter   . Bladder Cancer Neg Hx   . Kidney cancer Neg Hx     Social History   Socioeconomic History  . Marital status: Married    Spouse name: Dana Bradley  . Number of children: 3  . Years of education: Not on file  . Highest education level: Associate degree: academic program  Occupational History  . Occupation: disbled    Comment: not able  Tobacco Use  . Smoking status: Former Smoker    Packs/day: 4.00    Years: 3.00    Pack years: 12.00    Types: Cigarettes    Quit date: 12/10/1992    Years since quitting: 27.2  . Smokeless tobacco: Never Used  . Tobacco comment: quit 25 years ago  Vaping Use  . Vaping Use: Never used  Substance and Sexual Activity  . Alcohol use: No    Alcohol/week: 0.0 standard drinks  . Drug use: No  . Sexual activity: Not Currently    Partners: Male    Birth control/protection: Surgical  Other Topics Concern  . Not on file  Social History Narrative   Lives at home with her husband and 2 of her children   Right handed   Caffeine: 0-2 cups daily   Social Determinants of Health   Financial Resource Strain:   . Difficulty of Paying Living Expenses:   Food Insecurity:   . Worried About Charity fundraiser in the Last Year:   . Arboriculturist in the Last Year:   Transportation Needs:   . Film/video editor (Medical):   Marland Kitchen Lack of Transportation (Non-Medical):   Physical Activity:   . Days of Exercise per Week:   . Minutes of Exercise per Session:   Stress:   . Feeling of Stress :   Social Connections:   . Frequency of Communication with Friends and Family:   . Frequency of Social Gatherings with Friends and Family:   . Attends Religious Services:   . Active Member of Clubs or Organizations:   . Attends Archivist Meetings:   Marland Kitchen Marital Status:    Past Surgical History:  Procedure Laterality Date  . ABLATION     Uterine  . CARDIAC CATHETERIZATION     with ablation  . COLONOSCOPY WITH PROPOFOL N/A 05/17/2015   Procedure: COLONOSCOPY WITH PROPOFOL;  Surgeon: Manya Silvas, MD;   Location[facility] ENDOSCOPY;  Service: Endoscopy;  Laterality: N/A;  . ESOPHAGOGASTRODUODENOSCOPY N/A 05/17/2015   Procedure: ESOPHAGOGASTRODUODENOSCOPY (EGD);  Surgeon: Manya Silvas, MD;  Location: [facility] ENDOSCOPY;  Service: Endoscopy;  Laterality: N/A;  . KNEE ARTHROSCOPY    . spg     6/18  . Tibial Tubercle Bypass Right 1998  . TUBAL LIGATION  10/01/99   Past Medical History:  Diagnosis Date  . Acute postoperative pain 04/07/2017  . Anxiety   . Bursitis   . Chronic fatigue 12/12/2017  . Chronic fatigue syndrome   .  Edema leg 05/02/2015  . Fibromyalgia   . GERD (gastroesophageal reflux disease)   . IBS (irritable bowel syndrome)   . Knee pain, bilateral 12/21/2008   Qualifier: Diagnosis of  By: Hassell Done FNP, Tori Milks    . Lumbar discitis   . Migraines   . Osteoarthritis   . Right hand pain 04/10/2015   [facility] has done nerve conduction studies and ruled out carpal tunnel.   . Sleep apnea   . Spinal stenosis   . SVT (supraventricular tachycardia) ([place])   . Vertigo   . Vitamin D deficiency 05/01/2016   BP 130/80 Comment: last recorded--unable to check at home  Pulse 90 Comment: last recorded--unable to check at home  Temp 98.2 F (36.8 C) Comment: last recorded--unable to check at home  Ht 5\' 3"  (1.6 m) Comment: last recorded--unable to check at home  Wt 218 lb (98.9 kg) Comment: last recorded--unable to check at home  LMP 02/12/2020 (Approximate)   BMI 38.62 kg/m   Opioid Risk Score:   Fall Risk Score:  `1  Depression screen PHQ 2/9  Depression screen [facility] 2/9 03/01/2020 01/24/2020 09/28/2019 09/21/2019 07/30/2019 07/26/2019 04/20/2019  Decreased Interest 0 0 0 1 1 1  0  Down, Depressed, Hopeless 0 0 0 1 0 1 0  PHQ - 2 Score 0 0 0 2 1 2  0  Altered sleeping - 0 - 1 2 1  0  Tired, decreased energy - 0 - 1 3 1  0  Change in appetite - 0 - 1 1 1  0  Feeling bad or failure about yourself  - 0 - 1 1 1  0  Trouble concentrating - 0 - 1 1 1  0  Moving slowly or fidgety/restless  - 0 - 1 1 1  0  Suicidal thoughts - 0 - 0 0 0 0  PHQ-9 Score - 0 - 8 10 8  0  Difficult doing work/chores - Not difficult at all - - Somewhat difficult Somewhat difficult Not difficult at all  Some recent data might be hidden    Review of Systems  Constitutional: Negative.   HENT: Negative.   Eyes: Negative.   Respiratory: Negative.   Cardiovascular: Negative.   Gastrointestinal: Negative.   Endocrine: Negative.   Genitourinary: Negative.   Musculoskeletal: Positive for arthralgias, back pain and myalgias.  Skin: Negative.   Allergic/Immunologic: Negative.   Neurological: Positive for numbness and headaches.       Tingling left side when migraine happens  Hematological: Negative.   Psychiatric/Behavioral: Positive for dysphoric mood.  All other systems reviewed and are negative.      Objective:   Physical Exam Not performed since patient seen via video.     Assessment & Plan:  Mrs. Houseworth is a 49 year old woman with chronic intractable migraines s/p numerous treatments, severe fibromyalgia, and IBS.   Continue Physical Therapy: For cervical myofascial pain syndrome: myofascial release, postural correction, stretching and strengthening of the muscles of the neck and upper back, development of HEP.   Exercise: Discussed that exercise is one of the most effective treatments for fibromyalgia. This will also help with her obesity. Made goal with Mrs. Deangelo to walk outside her home at least once per day, and to garden at least once per day (her favorite activity). Can use elliptical which she has at home on rainy days.   Quality of Life: Discussed that Mrs. Topel's greatest source of happiness is her family. This community will be essential in helping her recover from her chronic pain  and to increase her daily activity.  Medications: Continue Lyrica and Baclofen for pain relief. Minimize use of Hydrocodone. Advised that she can have increased dose of Lyrica if this is helpful to  her.   Migraines: Provided refill of Fioricet, which is one of the medications that helps her. Advised to use upon migraine onset and to not use more frequently than q6H during migraine. Refilled Zofran for nausea last week, and this has been helping her. Continue metoprolol which can be helpful in migraine prophylaxis. Prescribed ergot nasal spray to try upon migraine initiation- advised no more frequently than 4 sprays per hour. Discussed avoiding foods that may trigger migraines.   18 minutes patient care time were spent during this visit. All questions were encouraged and answered. Follow up with me PRN.

## 2020-03-14 ENCOUNTER — Telehealth: Payer: Self-pay | Admitting: Pain Medicine

## 2020-03-14 NOTE — Telephone Encounter (Signed)
Patient called stating the [facility] does not accept [facility]. Could she get a referral to a [facility]

## 2020-03-15 ENCOUNTER — Ambulatory Visit: Payer: Self-pay

## 2020-03-15 ENCOUNTER — Ambulatory Visit
Admission: RE | Admit: 2020-03-15 | Discharge: 2020-03-15 | Disposition: A | Discharge status: Home / Self Care | Payer: Self-pay | Source: Ambulatory Visit | Attending: Pain Medicine | Admitting: Pain Medicine

## 2020-03-15 ENCOUNTER — Other Ambulatory Visit
Admission: RE | Admit: 2020-03-15 | Discharge: 2020-03-15 | Disposition: A | Discharge status: Home / Self Care | Payer: Self-pay | Source: Ambulatory Visit | Attending: Gastroenterology | Admitting: Gastroenterology

## 2020-03-15 ENCOUNTER — Other Ambulatory Visit: Payer: Self-pay

## 2020-03-15 DIAGNOSIS — M5126 Other intervertebral disc displacement, lumbar region: Secondary | ICD-10-CM

## 2020-03-15 DIAGNOSIS — R252 Cramp and spasm: Secondary | ICD-10-CM

## 2020-03-15 DIAGNOSIS — Z9181 History of falling: Secondary | ICD-10-CM

## 2020-03-15 DIAGNOSIS — M79605 Pain in left leg: Secondary | ICD-10-CM | POA: Insufficient documentation

## 2020-03-15 DIAGNOSIS — M5442 Lumbago with sciatica, left side: Secondary | ICD-10-CM

## 2020-03-15 DIAGNOSIS — M5440 Lumbago with sciatica, unspecified side: Secondary | ICD-10-CM

## 2020-03-15 DIAGNOSIS — M47816 Spondylosis without myelopathy or radiculopathy, lumbar region: Secondary | ICD-10-CM

## 2020-03-15 DIAGNOSIS — M79604 Pain in right leg: Secondary | ICD-10-CM | POA: Insufficient documentation

## 2020-03-15 DIAGNOSIS — Z20822 Contact with and (suspected) exposure to covid-19: Secondary | ICD-10-CM | POA: Insufficient documentation

## 2020-03-15 DIAGNOSIS — M7702 Medial epicondylitis, left elbow: Secondary | ICD-10-CM

## 2020-03-15 DIAGNOSIS — M5137 Other intervertebral disc degeneration, lumbosacral region: Secondary | ICD-10-CM | POA: Insufficient documentation

## 2020-03-15 DIAGNOSIS — R262 Difficulty in walking, not elsewhere classified: Secondary | ICD-10-CM

## 2020-03-15 DIAGNOSIS — M51379 Other intervertebral disc degeneration, lumbosacral region without mention of lumbar back pain or lower extremity pain: Secondary | ICD-10-CM

## 2020-03-15 DIAGNOSIS — Z01818 Encounter for other preprocedural examination: Secondary | ICD-10-CM | POA: Insufficient documentation

## 2020-03-15 DIAGNOSIS — M6281 Muscle weakness (generalized): Secondary | ICD-10-CM

## 2020-03-15 DIAGNOSIS — M51369 Other intervertebral disc degeneration, lumbar region without mention of lumbar back pain or lower extremity pain: Secondary | ICD-10-CM

## 2020-03-15 DIAGNOSIS — M4807 Spinal stenosis, lumbosacral region: Secondary | ICD-10-CM

## 2020-03-15 DIAGNOSIS — G8929 Other chronic pain: Secondary | ICD-10-CM

## 2020-03-15 DIAGNOSIS — R269 Unspecified abnormalities of gait and mobility: Secondary | ICD-10-CM

## 2020-03-15 DIAGNOSIS — N39498 Other specified urinary incontinence: Secondary | ICD-10-CM

## 2020-03-15 DIAGNOSIS — M542 Cervicalgia: Secondary | ICD-10-CM

## 2020-03-15 DIAGNOSIS — M5136 Other intervertebral disc degeneration, lumbar region: Secondary | ICD-10-CM | POA: Insufficient documentation

## 2020-03-15 DIAGNOSIS — M25521 Pain in right elbow: Secondary | ICD-10-CM
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
## 2020-03-16 LAB — SARS CORONAVIRUS 2 (TAT 6-24 HRS): SARS Coronavirus 2: NEGATIVE

## 2020-03-17 ENCOUNTER — Encounter: Payer: Self-pay | Admitting: Gastroenterology

## 2020-03-20 ENCOUNTER — Ambulatory Visit: Payer: Self-pay | Admitting: Registered Nurse

## 2020-03-20 ENCOUNTER — Encounter: Payer: Self-pay | Admitting: Gastroenterology

## 2020-03-20 ENCOUNTER — Encounter
Admission: RE | Disposition: A | Discharge status: Home / Self Care | Payer: Self-pay | Source: Home / Self Care | Attending: Gastroenterology

## 2020-03-20 ENCOUNTER — Other Ambulatory Visit: Payer: Self-pay

## 2020-03-20 ENCOUNTER — Ambulatory Visit
Admission: RE | Admit: 2020-03-20 | Discharge: 2020-03-20 | Disposition: A | Discharge status: Home / Self Care | Payer: Self-pay | Attending: Gastroenterology | Admitting: Gastroenterology

## 2020-03-20 DIAGNOSIS — Z91018 Allergy to other foods: Secondary | ICD-10-CM | POA: Insufficient documentation

## 2020-03-20 DIAGNOSIS — T184XXA Foreign body in colon, initial encounter: Secondary | ICD-10-CM | POA: Insufficient documentation

## 2020-03-20 DIAGNOSIS — Z880 Allergy status to penicillin: Secondary | ICD-10-CM | POA: Insufficient documentation

## 2020-03-20 DIAGNOSIS — Z881 Allergy status to other antibiotic agents status: Secondary | ICD-10-CM | POA: Insufficient documentation

## 2020-03-20 DIAGNOSIS — Z8349 Family history of other endocrine, nutritional and metabolic diseases: Secondary | ICD-10-CM | POA: Insufficient documentation

## 2020-03-20 DIAGNOSIS — Z882 Allergy status to sulfonamides status: Secondary | ICD-10-CM | POA: Insufficient documentation

## 2020-03-20 DIAGNOSIS — G43909 Migraine, unspecified, not intractable, without status migrainosus: Secondary | ICD-10-CM | POA: Insufficient documentation

## 2020-03-20 DIAGNOSIS — E559 Vitamin D deficiency, unspecified: Secondary | ICD-10-CM | POA: Insufficient documentation

## 2020-03-20 DIAGNOSIS — R5382 Chronic fatigue, unspecified: Secondary | ICD-10-CM | POA: Insufficient documentation

## 2020-03-20 DIAGNOSIS — Z886 Allergy status to analgesic agent status: Secondary | ICD-10-CM | POA: Insufficient documentation

## 2020-03-20 DIAGNOSIS — K219 Gastro-esophageal reflux disease without esophagitis: Secondary | ICD-10-CM | POA: Insufficient documentation

## 2020-03-20 DIAGNOSIS — Z833 Family history of diabetes mellitus: Secondary | ICD-10-CM | POA: Insufficient documentation

## 2020-03-20 DIAGNOSIS — Z8249 Family history of ischemic heart disease and other diseases of the circulatory system: Secondary | ICD-10-CM | POA: Insufficient documentation

## 2020-03-20 DIAGNOSIS — G4730 Sleep apnea, unspecified: Secondary | ICD-10-CM | POA: Insufficient documentation

## 2020-03-20 DIAGNOSIS — M797 Fibromyalgia: Secondary | ICD-10-CM | POA: Insufficient documentation

## 2020-03-20 DIAGNOSIS — Z91040 Latex allergy status: Secondary | ICD-10-CM | POA: Insufficient documentation

## 2020-03-20 DIAGNOSIS — Z87891 Personal history of nicotine dependence: Secondary | ICD-10-CM | POA: Insufficient documentation

## 2020-03-20 DIAGNOSIS — Z8261 Family history of arthritis: Secondary | ICD-10-CM | POA: Insufficient documentation

## 2020-03-20 DIAGNOSIS — Z79891 Long term (current) use of opiate analgesic: Secondary | ICD-10-CM | POA: Insufficient documentation

## 2020-03-20 DIAGNOSIS — M1990 Unspecified osteoarthritis, unspecified site: Secondary | ICD-10-CM | POA: Insufficient documentation

## 2020-03-20 DIAGNOSIS — X58XXXA Exposure to other specified factors, initial encounter: Secondary | ICD-10-CM | POA: Insufficient documentation

## 2020-03-20 DIAGNOSIS — K580 Irritable bowel syndrome with diarrhea: Secondary | ICD-10-CM | POA: Insufficient documentation

## 2020-03-20 DIAGNOSIS — Z6838 Body mass index (BMI) 38.0-38.9, adult: Secondary | ICD-10-CM | POA: Insufficient documentation

## 2020-03-20 DIAGNOSIS — Z79899 Other long term (current) drug therapy: Secondary | ICD-10-CM | POA: Insufficient documentation

## 2020-03-20 DIAGNOSIS — R197 Diarrhea, unspecified: Secondary | ICD-10-CM

## 2020-03-20 DIAGNOSIS — Z91013 Allergy to seafood: Secondary | ICD-10-CM | POA: Insufficient documentation

## 2020-03-20 HISTORY — PX: COLONOSCOPY WITH PROPOFOL: SHX5780

## 2020-03-20 LAB — POCT PREGNANCY, URINE: Preg Test, Ur: NEGATIVE

## 2020-03-20 SURGERY — COLONOSCOPY WITH PROPOFOL
Anesthesia: General

## 2020-03-20 MED ORDER — SODIUM CHLORIDE 0.9 % IV SOLN: INTRAVENOUS | Status: DC

## 2020-03-20 MED ORDER — PROPOFOL 10 MG/ML IV BOLUS
INTRAVENOUS | Status: DC | PRN
  Administered 2020-03-20: 90 mg via INTRAVENOUS
  Administered 2020-03-20: 10 mg via INTRAVENOUS

## 2020-03-20 MED ORDER — ONDANSETRON HCL 4 MG/2ML IJ SOLN: 4.0000 mg | Freq: Once | INTRAMUSCULAR | Status: AC

## 2020-03-20 MED ORDER — PROPOFOL 500 MG/50ML IV EMUL
INTRAVENOUS | Status: AC
  Filled 2020-03-20: qty 50

## 2020-03-20 MED ORDER — ONDANSETRON HCL 4 MG/2ML IJ SOLN
INTRAMUSCULAR | Status: AC
  Administered 2020-03-20: 4 mg via INTRAVENOUS
  Filled 2020-03-20: qty 2

## 2020-03-20 MED ORDER — PROPOFOL 500 MG/50ML IV EMUL
INTRAVENOUS | Status: DC | PRN
  Administered 2020-03-20: 150 ug/kg/min via INTRAVENOUS

## 2020-03-20 NOTE — Progress Notes (Signed)
Patient: Dana Bradley  Service Category: E/M  Provider: Gaspar Cola, MD  DOB: [birth year]  DOS: 03/22/2020  Location: Office  MRN: [id number]  Setting: Ambulatory outpatient  Referring Provider: Delsa Grana, PA-C  Type: Established Patient  Specialty: Interventional Pain Management  PCP: Delsa Grana, PA-C  Location: Remote location  Delivery: TeleHealth     Virtual Encounter - Pain Management PROVIDER NOTE: Information contained herein reflects review and annotations entered in association with encounter. Interpretation of such information and data should be left to medically-trained personnel. Information provided to patient can be located elsewhere in the medical record under "Patient Instructions". Document created using STT-dictation technology, any transcriptional errors that may result from process are unintentional.    Contact & Pharmacy Preferred: [phone] Home: [phone] (home) Mobile: There is no such number on file (mobile). E-mail: [email or web address]  [facility] [place], [place] [place] [address] Phone: [phone] Fax: [phone]   Pre-screening  [name] offered "in-person" vs "virtual" encounter. She indicated preferring virtual for this encounter.   Reason COVID-19*   Social distancing based on CDC and AMA recommendations.   I contacted Dana Bradley on 03/22/2020 via telephone.      I clearly identified myself as Gaspar Cola, MD. I verified that I was speaking with the correct person using two identifiers (Name: Dana Bradley, and date of birth: [birth year]).  Consent I sought verbal advanced consent from Dana Bradley for virtual visit interactions. I informed Dana Bradley of possible security and privacy concerns, risks, and limitations associated with providing "not-in-person" medical evaluation and management services. I also informed Dana Bradley of the availability of "in-person"  appointments. Finally, I informed her that there would be a charge for the virtual visit and that she could be  personally, fully or partially, financially responsible for it. Dana Bradley expressed understanding and agreed to proceed.   Historic Elements   Dana Bradley is a 49 y.o. year old, female patient evaluated today after her last contact with our practice on 03/14/2020. Dana Bradley  has a past medical history of Acute postoperative pain (04/07/2017), Anxiety, Bursitis, Chronic fatigue (12/12/2017), Chronic fatigue syndrome, Edema leg (05/02/2015), Fibromyalgia, GERD (gastroesophageal reflux disease), IBS (irritable bowel syndrome), Knee pain, bilateral (12/21/2008), Lumbar discitis, Migraines, Osteoarthritis, Right hand pain (04/10/2015), Sleep apnea, Spinal stenosis, SVT (supraventricular tachycardia) ([facility]), Vertigo, and Vitamin D deficiency (05/01/2016). She also  has a past surgical history that includes Ablation; Tubal ligation (10/01/99); Cardiac catheterization; Knee arthroscopy; Colonoscopy with propofol (N/A, 05/17/2015); Esophagogastroduodenoscopy (N/A, 05/17/2015); spg; Tibial Tubercle Bypass (Right, 1998); and Colonoscopy with propofol (N/A, 03/20/2020). Dana Bradley has a current medication list which includes the following prescription(s): atorvastatin, baclofen, butalbital-acetaminophen-caffeine, vitamin b-12, dexilant, diclofenac sodium, dicyclomine, dihydroergotamine, hydrocodone-acetaminophen, [START ON 03/31/2020] hydrocodone-acetaminophen, [START ON 04/30/2020] hydrocodone-acetaminophen, magnesium oxide, melatonin, metoprolol tartrate, mupirocin ointment, omeprazole, ondansetron, pregabalin, rifaximin, valerian, vitamin d (ergocalciferol), and diclofenac sodium. She  reports that she quit smoking about 27 years ago. Her smoking use included cigarettes. She has a 12.00 pack-year smoking history. She has never used smokeless tobacco. She reports that she does not drink alcohol and does not use drugs. Dana Bradley  is allergic to aspirin, cymbalta [duloxetine hcl], depakote [divalproex sodium], gadolinium derivatives, haloperidol, meperidine, reglan [metoclopramide], tramadol hcl, trazodone, compazine [prochlorperazine], meloxicam, penicillins, tomato, other, shellfish allergy, shellfish-derived products, bacitracin-neomycin-polymyxin, cephalosporins, ibuprofen, latex, neosporin [neomycin-bacitracin zn-polymyx], nsaids, sulfa antibiotics, and sulfonamide derivatives.   HPI  Today, she is being contacted for  a post-procedure assessment.  The patient indicates having done great with his last interventional treatment.  At this point she refers that she is at least 50% better, 50% of the time.  She refers that she is having a little bit of discomfort today, but this is because she was doing physical therapy yesterday.  She is currently enrolled in physical therapy for her pain and hopefully this will also contribute in improving things for her.  She indicated that should the pain return, she will be contacting us to repeat this procedure.  Post-Procedure Evaluation  Procedure: Diagnostic/therapeutic bilateral L2 transforaminal ESI #1 under fluoroscopic guidance and IV sedation Pre-procedure pain level: 6/10 Post-procedure: 6/10 No relief  Sedation: Sedation provided.  Effectiveness during initial hour after procedure(Ultra-Short Term Relief): 100 %.  Local anesthetic used: Long-acting (4-6 hours) Effectiveness: Defined as any analgesic benefit obtained secondary to the administration of local anesthetics. This carries significant diagnostic value as to the etiological location, or anatomical origin, of the pain. Duration of benefit is expected to coincide with the duration of the local anesthetic used.  Effectiveness during initial 4-6 hours after procedure(Short-Term Relief): 100 %.  Long-term benefit: Defined as any relief past the pharmacologic duration of the local anesthetics.  Effectiveness past the initial  6 hours after procedure(Long-Term Relief): 100 %.  Current benefits: Defined as benefit that persist at this time.   Analgesia:  >50% relief Function: Dana Bradley reports improvement in function ROM: Ms. Haley reports improvement in ROM  Pharmacotherapy Assessment  Analgesic: Hydrocodone/APAP 7.5/325, 1 tab PO q 6 hrs MME/day:3.'75mg'$ /day.   Monitoring: [place] PMP: PDMP reviewed during this encounter.       Pharmacotherapy: No side-effects or adverse reactions reported. Compliance: No problems identified. Effectiveness: Clinically acceptable. Plan: Refer to "POC".  UDS:  Summary  Date Value Ref Range Status  03/05/2018 FINAL  Final    Comment:    ==================================================================== TOXASSURE SELECT 13 (MW) ==================================================================== Test                             Result       Flag       Units Drug Present   Hydrocodone                    422                     ng/mg creat   Hydromorphone                  105                     ng/mg creat   Norhydrocodone                 312                     ng/mg creat    Sources of hydrocodone include scheduled prescription    medications. Hydromorphone and norhydrocodone are expected    metabolites of hydrocodone. Hydromorphone is also available as a    scheduled prescription medication. ==================================================================== Test                      Result    Flag   Units      Ref Range   Creatinine              148  mg/dL      >=20 ==================================================================== Declared Medications:  Medication list was not provided. ==================================================================== For clinical consultation, please call [phone]. ====================================================================    Laboratory Chemistry Profile   Renal Lab Results  Component Value  Date   BUN 9 02/22/2020   CREATININE 0.65 44/11/4740   BCR NOT APPLICABLE 59/56/3875   GFRAA >60 02/22/2020   GFRNONAA >60 02/22/2020     Hepatic Lab Results  Component Value Date   AST 28 02/22/2020   ALT 37 02/22/2020   ALBUMIN 4.1 02/22/2020   ALKPHOS 83 02/22/2020   HCVAB NON REACTIVE 09/21/2019     Electrolytes Lab Results  Component Value Date   NA 140 02/22/2020   K 3.8 02/22/2020   CL 105 02/22/2020   CALCIUM 9.3 02/22/2020   MG 2.1 04/01/2017     Bone Lab Results  Component Value Date   VD25OH 16.49 (L) 02/22/2020   25OHVITD1 20 (L) 04/01/2017   25OHVITD2 5.4 04/01/2017   25OHVITD3 15 04/01/2017     Inflammation (CRP: Acute Phase) (ESR: Chronic Phase) Lab Results  Component Value Date   CRP 6.0 07/30/2018   ESRSEDRATE 17 07/30/2018       Note: Above Lab results reviewed.   Imaging  MR LUMBAR SPINE WO CONTRAST CLINICAL DATA:  Low back pain with bilateral leg pain.  EXAM: MRI LUMBAR SPINE WITHOUT CONTRAST  TECHNIQUE: Multiplanar, multisequence MR imaging of the lumbar spine was performed. No intravenous contrast was administered.  COMPARISON:  Lumbar MRI 07/05/2019  FINDINGS: Segmentation:  Normal  Alignment:  Normal  Vertebrae:  Negative for fracture or mass.  Normal bone marrow.  Conus medullaris and cauda equina: Conus extends to the L1-2 level. Conus and cauda equina appear normal.  Paraspinal and other soft tissues: Negative for paraspinous mass or adenopathy.  Disc levels:  T12-L1: Negative  L1-2: Chronic right paracentral disc protrusion with associated spurring is unchanged from the prior study. There is flattening of the thecal sac without significant spinal or foraminal stenosis. No interval change.  L2-3: Negative  L3-4: Negative  L4-5: Mild disc space narrowing and disc desiccation. Shallow broad-based disc protrusion appears unchanged. Negative for spinal or foraminal stenosis.  L5-S1:  Negative  IMPRESSION: No change from the prior MRI.  Chronic right paracentral disc protrusion at L1-2 with spurring is unchanged. Shallow broad-based disc protrusion L4-5 unchanged.  Electronically Signed   By: Franchot Gallo M.D.   On: 03/16/2020 09:39  Assessment  The primary encounter diagnosis was Chronic pain syndrome. Diagnoses of Chronic low back pain (Primary Area of Pain) (Bilateral) (R>L) (midline), Chronic upper back pain (Secondary area of Pain) (Bilateral) (L>R), and Chronic occipital neuralgia (Third area of Pain) (Bilateral) (L>R) were also pertinent to this visit.  Plan of Care  Problem-specific:  No problem-specific Assessment & Plan notes found for this encounter.  Ms. Lynzie Cliburn has a current medication list which includes the following long-term medication(s): atorvastatin, baclofen, dexilant, diclofenac sodium, diclofenac sodium, dicyclomine, dihydroergotamine, hydrocodone-acetaminophen, [START ON 03/31/2020] hydrocodone-acetaminophen, [START ON 04/30/2020] hydrocodone-acetaminophen, metoprolol tartrate, omeprazole, and pregabalin.  Pharmacotherapy (Medications Ordered): No orders of the defined types were placed in this encounter.  Orders:  No orders of the defined types were placed in this encounter.  Follow-up plan:   Return in about 2 months (around 05/29/2020) for (F2F), (MM).      Interventional treatment options:  Under consideration:  Neurosurgical referral for decompressive laminectomy and/or discectomy   Therapeutic/palliative (PRN): Diagnostic left SI joint block #2  Diagnostic/therapeutic left IA hip joint injection #2  Diagnostic bilateral lumbar facet block #2  Palliative left trapezius muscle MNB #4 Palliative right trapezius muscle MNB #2 Palliative left L1-2 LESI #2 Palliative right L1-2 LESI #2 Palliativeleft CESI #3 Palliative left greater occipital NB #3  Palliative left C2 + TON NB #2  Palliative left GON + C2 + TON RFA #2  (last done 12/25/2017) Diagnostic/therapeutic bilateral L2 TFESI #2 (done on 03/07/2020) (100/100/100/>50)    Recent Visits Date Type Provider Dept  03/07/20 Procedure visit Milinda Pointer, MD [facility]  03/01/20 Office Visit Milinda Pointer, MD [facility]  Showing recent visits within past 90 days and meeting all other requirements Today's Visits Date Type Provider Dept  03/22/20 Telemedicine Milinda Pointer, MD [facility]  Showing today's visits and meeting all other requirements Future Appointments No visits were found meeting these conditions. Showing future appointments within next 90 days and meeting all other requirements  I discussed the assessment and treatment plan with the patient. The patient was provided an opportunity to ask questions and all were answered. The patient agreed with the plan and demonstrated an understanding of the instructions.  Patient advised to call back or seek an in-person evaluation if the symptoms or condition worsens.  Duration of encounter: 15 minutes.  Note by: Gaspar Cola, MD Date: 03/22/2020; Time: 8:47 AM

## 2020-03-20 NOTE — Anesthesia Postprocedure Evaluation (Signed)
Anesthesia Post Note  Patient: Dana Bradley  Procedure(s) Performed: COLONOSCOPY WITH PROPOFOL (N/A )  Patient location during evaluation: Endoscopy Anesthesia Type: General Level of consciousness: awake and alert Pain management: pain level controlled Vital Signs Assessment: post-procedure vital signs reviewed and stable Respiratory status: spontaneous breathing, nonlabored ventilation, respiratory function stable and patient connected to nasal cannula oxygen Cardiovascular status: blood pressure returned to baseline and stable Postop Assessment: no apparent nausea or vomiting Anesthetic complications: no   No complications documented.   Last Vitals:  Vitals:   03/20/20 1010 03/20/20 1014  BP:  129/77  Pulse: 75 80  Resp: 16 (!) 21  Temp:    SpO2: 100% 100%    Last Pain:  Vitals:   03/20/20 0951  TempSrc: Tympanic  PainSc:                  Martha Clan

## 2020-03-20 NOTE — Op Note (Signed)
[facility] Patient Name: Dana Bradley Procedure Date: 03/20/2020 9:03 AM MRN: [id number] Account #: [id number] Date of Birth: 08/05/1971 Admit Type: Outpatient Age: 49 Room: [facility] ENDO ROOM 4 Gender: Female Note Status: Finalized Procedure:             Colonoscopy Indications:           Clinically significant diarrhea of unexplained origin Providers:             Jonathon Bellows MD, MD Medicines:             Monitored Anesthesia Care Complications:         No immediate complications. Procedure:             Pre-Anesthesia Assessment:                        - Prior to the procedure, a History and Physical was                         performed, and patient medications, allergies and                         sensitivities were reviewed. The patient's tolerance                         of previous anesthesia was reviewed.                        - The risks and benefits of the procedure and the                         sedation options and risks were discussed with the                         patient. All questions were answered and informed                         consent was obtained.                        - ASA Grade Assessment: II - A patient with mild                         systemic disease.                        After obtaining informed consent, the colonoscope was                         passed under direct vision. Throughout the procedure,                         the patient's blood pressure, pulse, and oxygen                         saturations were monitored continuously. The                         Colonoscope was introduced through the anus and  advanced to the the cecum, identified by the                         appendiceal orifice. The colonoscopy was performed                         with ease. The patient tolerated the procedure well.                         The quality of the bowel preparation was good. Findings:       The terminal ileum appeared normal. Biopsies were taken with a cold       forceps for histology.      The colon (entire examined portion) appeared normal. Biopsies were taken       with a cold forceps for histology.      The exam was otherwise without abnormality on direct and retroflexion       views.      A foreign body was found in the transverse colon. piece oif glass Impression:            - The examined portion of the ileum was normal.                         Biopsied.                        - The entire examined colon is normal. Biopsied.                        - The examination was otherwise normal on direct and                         retroflexion views. Recommendation:        - Discharge patient to home (with escort).                        - Resume previous diet.                        - Continue present medications.                        - Await pathology results.                        - Repeat colonoscopy for surveillance based on                         pathology results.                        - Return to GI office as previously scheduled. Procedure Code(s):     --- Professional ---                        [phone], Colonoscopy, flexible; with biopsy, single or                         multiple Diagnosis Code(s):     --- Professional ---  R19.7, Diarrhea, unspecified CPT copyright 2019 American Medical Association. All rights reserved. The codes documented in this report are preliminary and upon coder review may  be revised to meet current compliance requirements. Jonathon Bellows, MD Jonathon Bellows MD, MD 03/20/2020 9:52:36 AM This report has been signed electronically. Number of Addenda: 0 Note Initiated On: 03/20/2020 9:03 AM Scope Withdrawal Time: 0 hours 16 minutes 11 seconds  Total Procedure Duration: 0 hours 23 minutes 33 seconds  Estimated Blood Loss:  Estimated blood loss: none.      [facility]

## 2020-03-20 NOTE — Anesthesia Procedure Notes (Signed)
Date/Time: 03/20/2020 9:25 AM Performed by: Doreen Salvage, CRNA Pre-anesthesia Checklist: Patient identified, Emergency Drugs available, Suction available and Patient being monitored Patient Re-evaluated:Patient Re-evaluated prior to induction Oxygen Delivery Method: Nasal cannula Induction Type: IV induction Dental Injury: Teeth and Oropharynx as per pre-operative assessment  Comments: Nasal cannula with etCO2 monitoring

## 2020-03-20 NOTE — Transfer of Care (Signed)
Immediate Anesthesia Transfer of Care Note  Patient: Dana Bradley  Procedure(s) Performed: Procedure(s): COLONOSCOPY WITH PROPOFOL (N/A)  Patient Location: PACU and Endoscopy Unit  Anesthesia Type:General  Level of Consciousness: sedated  Airway & Oxygen Therapy: Patient Spontanous Breathing and Patient connected to nasal cannula oxygen  Post-op Assessment: Report given to RN and Post -op Vital signs reviewed and stable  Post vital signs: Reviewed and stable  Last Vitals:  Vitals:   03/20/20 0954 03/20/20 0955  BP: 101/63 101/63  Pulse: 81 77  Resp: 15 14  Temp:    SpO2: 83% 77%    Complications: No apparent anesthesia complications

## 2020-03-20 NOTE — Anesthesia Preprocedure Evaluation (Signed)
Anesthesia Evaluation  Patient identified by MRN, date of birth, ID band Patient awake    Reviewed: Allergy & Precautions, NPO status , Patient's Chart, lab work & pertinent test results, reviewed documented beta blocker date and time   History of Anesthesia Complications Negative for: history of anesthetic complications  Airway Mallampati: III  TM Distance: <3 FB Neck ROM: Full    Dental  (+) Dental Advidsory Given, Teeth Intact   Pulmonary neg shortness of breath, sleep apnea and Continuous Positive Airway Pressure Ventilation , neg COPD, neg recent URI, former smoker,    Pulmonary exam normal breath sounds clear to auscultation       Cardiovascular Exercise Tolerance: Good hypertension, Pt. on medications (-) angina(-) Past MI and (-) Cardiac Stents + dysrhythmias Supra Ventricular Tachycardia (-) Valvular Problems/Murmurs     Neuro/Psych  Headaches, neg Seizures PSYCHIATRIC DISORDERS Anxiety Depression fibromyalgia  Neuromuscular disease (fibromyalgia)    GI/Hepatic Neg liver ROS, GERD  Controlled and Medicated,  Endo/Other  neg diabetesMorbid obesity  Renal/GU Renal disease (kidney stones)  negative genitourinary   Musculoskeletal  (+) Arthritis , Osteoarthritis,  Fibromyalgia -  Abdominal (+) + obese,   Peds negative pediatric ROS (+)  Hematology negative hematology ROS (+)   Anesthesia Other Findings Past Medical History: 04/07/2017: Acute postoperative pain No date: Anxiety No date: Bursitis 12/12/2017: Chronic fatigue No date: Chronic fatigue syndrome 05/02/2015: Edema leg No date: Fibromyalgia No date: GERD (gastroesophageal reflux disease) No date: IBS (irritable bowel syndrome) 12/21/2008: Knee pain, bilateral     Comment:  Qualifier: Diagnosis of  By: Hassell Done FNP, Tori Milks   No date: Lumbar discitis No date: Migraines No date: Osteoarthritis 04/10/2015: Right hand pain     Comment:  [facility]  Neurology has done nerve conduction studies and              ruled out carpal tunnel.  No date: Sleep apnea No date: Spinal stenosis No date: SVT (supraventricular tachycardia) (HCC) No date: Vertigo 05/01/2016: Vitamin D deficiency   Reproductive/Obstetrics negative OB ROS                             Anesthesia Physical  Anesthesia Plan  ASA: III  Anesthesia Plan: General   Post-op Pain Management:    Induction: Intravenous  PONV Risk Score and Plan: 3 and Propofol infusion and TIVA  Airway Management Planned: Nasal Cannula and Natural Airway  Additional Equipment:   Intra-op Plan:   Post-operative Plan:   Informed Consent: I have reviewed the patients History and Physical, chart, labs and discussed the procedure including the risks, benefits and alternatives for the proposed anesthesia with the patient or authorized representative who has indicated his/her understanding and acceptance.     Dental advisory given  Plan Discussed with: CRNA and Surgeon  Anesthesia Plan Comments:         Anesthesia Quick Evaluation

## 2020-03-20 NOTE — H&P (Signed)
Jonathon Bellows, MD [address] [place], [place], [place], [address] [address], [place], [place], [place], [address] Phone: [phone]  Fax: [phone]  Primary Care Physician:  Delsa Grana, PA-C   Pre-Procedure History & Physical: HPI:  Dana Bradley is a 49 y.o. female is here for an colonoscopy.   Past Medical History:  Diagnosis Date  . Acute postoperative pain 04/07/2017  . Anxiety   . Bursitis   . Chronic fatigue 12/12/2017  . Chronic fatigue syndrome   . Edema leg 05/02/2015  . Fibromyalgia   . GERD (gastroesophageal reflux disease)   . IBS (irritable bowel syndrome)   . Knee pain, bilateral 12/21/2008   Qualifier: Diagnosis of  By: Hassell Done FNP, Tori Milks    . Lumbar discitis   . Migraines   . Osteoarthritis   . Right hand pain 04/10/2015   [facility] has done nerve conduction studies and ruled out carpal tunnel.   . Sleep apnea   . Spinal stenosis   . SVT (supraventricular tachycardia) ([place])   . Vertigo   . Vitamin D deficiency 05/01/2016    Past Surgical History:  Procedure Laterality Date  . ABLATION     Uterine  . CARDIAC CATHETERIZATION     with ablation  . COLONOSCOPY WITH PROPOFOL N/A 05/17/2015   Procedure: COLONOSCOPY WITH PROPOFOL;  Surgeon: Manya Silvas, MD;  Location[facility] ENDOSCOPY;  Service: Endoscopy;  Laterality: N/A;  . ESOPHAGOGASTRODUODENOSCOPY N/A 05/17/2015   Procedure: ESOPHAGOGASTRODUODENOSCOPY (EGD);  Surgeon: Manya Silvas, MD;  Location[facility] END[facility]COPY;  Service: Endoscopy;  Laterality: N/A;  . KNEE ARTHROSCOPY    . spg     6/18  . Tibial Tubercle Bypass Right 1998  . TUBAL LIGATION  10/01/99    Prior to Admission medications   Medication Sig Start Date End Date Taking? Authorizing Provider  atorvastatin (LIPITOR) 40 MG tablet Take 1 tablet (40 mg total) by mouth at bedtime. 01/24/20  Yes Delsa Grana, PA-C  baclofen (LIORESAL) 10 MG tablet Take 1 tablet (10 mg total) by mouth 3 (three) times daily. 02/07/20  08/05/20 Yes Milinda Pointer, MD  butalbital-acetaminophen-caffeine (FIORICET) [phone] MG tablet Take 1-2 tablets by mouth every 6 (six) hours as needed for headache. 03/13/20 03/13/21 Yes Raulkar, Clide Deutscher, MD  Cyanocobalamin (VITAMIN B-12) 500 MCG SUBL Place 1,500 mcg under the tongue daily.  11/20/17  Yes Lada, Satira Anis, MD  Dexlansoprazole (DEXILANT) 30 MG capsule Take 1 capsule (30 mg total) by mouth daily. This replaces omeprazole 08/03/19  Yes Hubbard Hartshorn, FNP  dicyclomine (BENTYL) 10 MG capsule Take 1 capsule (10 mg total) by mouth 4 (four) times daily -  before meals and at bedtime. As needed. 03/08/20 09/04/20 Yes Jonathon Bellows, MD  magnesium oxide (MAG-OX) 400 MG tablet Take 300 mg by mouth daily.    Yes [provider]  Melatonin 10 MG TABS Take 10 mg by mouth at bedtime.   Yes [provider]  metoprolol tartrate (LOPRESSOR) 50 MG tablet Take 1 tablet (50 mg total) by mouth 2 (two) times daily. 01/24/20  Yes Delsa Grana, PA-C  mupirocin ointment (BACTROBAN) 2 % Apply 1 application topically 2 (two) times daily as needed. 01/24/20  Yes Delsa Grana, PA-C  ondansetron (ZOFRAN) 4 MG tablet Take 1 tablet (4 mg total) by mouth every 8 (eight) hours as needed. 03/08/20  Yes Raulkar, Clide Deutscher, MD  pregabalin (LYRICA) 150 MG capsule Take 1 capsule (150 mg total) by mouth every 8 (  eight) hours. 03/19/20 09/15/20 Yes Milinda Pointer, MD  VALERIAN PO Take by mouth as needed. Makes Valerian tea about 3-4 times per week.   Yes [provider]  Vitamin D, Ergocalciferol, (DRISDOL) 1.25 MG (50000 UNIT) CAPS capsule Take 1 capsule (50,000 Units total) by mouth 2 (two) times a week. x12 weeks. 03/06/20  Yes Delsa Grana, PA-C  diclofenac sodium (VOLTAREN) 1 % GEL Apply 2 g topically 4 (four) times daily. Patient taking differently: Apply 2 g topically 4 (four) times daily. As needed. 08/03/19 03/01/20  Milinda Pointer, MD  diclofenac Sodium (VOLTAREN) 1 % GEL Apply 2 g topically 4  (four) times daily as needed. 03/01/20 08/28/20  Milinda Pointer, MD  dihydroergotamine (MIGRANAL) 4 MG/ML nasal spray Place 1 spray into the nose as needed for migraine. Use in one nostril as directed.  No more than 4 sprays in one hour 03/13/20   Raulkar, Clide Deutscher, MD  HYDROcodone-acetaminophen (NORCO) 7.5-325 MG tablet Take 1 tablet by mouth every 6 (six) hours as needed for severe pain. Must last 90 days 03/01/20 03/31/20  Milinda Pointer, MD  HYDROcodone-acetaminophen ([place]) 7.5-325 MG tablet Take 1 tablet by mouth every 6 (six) hours as needed for severe pain. Must last 90 days 03/31/20 04/30/20  Milinda Pointer, MD  HYDROcodone-acetaminophen ([place]) 7.5-325 MG tablet Take 1 tablet by mouth every 6 (six) hours as needed for severe pain. Must last 90 days 04/30/20 05/30/20  Milinda Pointer, MD  omeprazole (PRILOSEC) 40 MG capsule Take 1 capsule (40 mg total) by mouth daily. 03/08/20   Jonathon Bellows, MD  rifaximin (XIFAXAN) 550 MG TABS tablet Take 1 tablet (550 mg total) by mouth 3 (three) times daily for 14 days. 03/08/20 03/22/20  Jonathon Bellows, MD    Allergies as of 03/08/2020 - Review Complete 03/08/2020  Allergen Reaction Noted  . Aspirin Swelling 06/13/2008  . Cymbalta [duloxetine hcl] Other (See Comments) 09/09/2016  . Depakote [divalproex sodium] Shortness Of Breath 12/10/2012  . Gadolinium derivatives  07/20/2019  . Haloperidol Shortness Of Breath 05/15/2015  . Meperidine Nausea And Vomiting 12/10/2012  . Reglan [metoclopramide] Shortness Of Breath 09/16/2016  . Tramadol hcl Palpitations 09/09/2016  . Trazodone Shortness Of Breath 05/15/2015  . Compazine [prochlorperazine] Other (See Comments) 09/16/2016  . Meloxicam Other (See Comments) 03/17/2014  . Penicillins Rash 06/13/2008  . Tomato Hives 04/29/2016  . Other  03/17/2014  . Shellfish allergy  03/17/2014  . Shellfish-derived products Other (See Comments) 01/23/2015  . Bacitracin-neomycin-polymyxin Rash 05/15/2015  . Cephalosporins  Rash 05/15/2015  . Ibuprofen Other (See Comments) and Rash 01/05/2013  . Latex Itching 03/17/2014  . Neosporin [neomycin-bacitracin zn-polymyx] Rash 12/10/2012  . Nsaids Other (See Comments) 03/17/2014  . Sulfa antibiotics Rash 03/17/2014  . Sulfonamide derivatives Rash 06/13/2008    Family History  Problem Relation Age of Onset  . Depression Mother   . Hypertension Mother   . Cancer Mother        Skin  . Hyperlipidemia Mother   . Anxiety disorder Mother   . Migraines Mother   . Alcohol abuse Father   . Depression Father   . Stroke Father   . Heart disease Father   . Hypertension Father   . Anxiety disorder Father   . Depression Sister   . Hyperlipidemia Sister   . Diabetes Sister   . Hypertension Sister   . Polycystic ovary syndrome Sister   . Bipolar disorder Sister   . Anxiety disorder Sister   . Migraines Sister   .  Cancer Maternal Grandmother 27       Breast  . Thyroid disease Maternal Grandmother   . Arthritis Maternal Grandmother   . Hyperlipidemia Maternal Grandmother   . Depression Sister   . Hypertension Sister   . Anxiety disorder Sister   . Migraines Sister   . Alzheimer's disease Other   . Aneurysm Maternal Grandfather   . Hypertension Maternal Grandfather   . Heart disease Maternal Grandfather   . Alzheimer's disease Paternal Grandmother   . Heart attack Paternal Grandfather   . Hypertension Paternal Grandfather   . COPD Paternal Grandfather   . Heart disease Paternal Grandfather   . Migraines Son   . Migraines Daughter   . Migraines Daughter   . Bladder Cancer Neg Hx   . Kidney cancer Neg Hx     Social History   Socioeconomic History  . Marital status: Married    Spouse name: brian  . Number of children: 3  . Years of education: Not on file  . Highest education level: Associate degree: academic program  Occupational History  . Occupation: disbled    Comment: not able  Tobacco Use  . Smoking status: Former Smoker    Packs/day: 4.00     Years: 3.00    Pack years: 12.00    Types: Cigarettes    Quit date: 12/10/1992    Years since quitting: 27.2  . Smokeless tobacco: Never Used  . Tobacco comment: quit 25 years ago  Vaping Use  . Vaping Use: Never used  Substance and Sexual Activity  . Alcohol use: No    Alcohol/week: 0.0 standard drinks  . Drug use: No  . Sexual activity: Not Currently    Partners: Male    Birth control/protection: Surgical  Other Topics Concern  . Not on file  Social History Narrative   Lives at home with her husband and 2 of her children   Right handed   Caffeine: 0-2 cups daily   Social Determinants of Health   Financial Resource Strain:   . Difficulty of Paying Living Expenses:   Food Insecurity:   . Worried About Charity fundraiser in the Last Year:   . Arboriculturist in the Last Year:   Transportation Needs:   . Film/video editor (Medical):   Marland Kitchen Lack of Transportation (Non-Medical):   Physical Activity:   . Days of Exercise per Week:   . Minutes of Exercise per Session:   Stress:   . Feeling of Stress :   Social Connections:   . Frequency of Communication with Friends and Family:   . Frequency of Social Gatherings with Friends and Family:   . Attends Religious Services:   . Active Member of Clubs or Organizations:   . Attends Archivist Meetings:   Marland Kitchen Marital Status:   Intimate Partner Violence:   . Fear of Current or Ex-Partner:   . Emotionally Abused:   Marland Kitchen Physically Abused:   . Sexually Abused:     Review of Systems: See HPI, otherwise negative ROS  Physical Exam: BP 116/83   Pulse 84   Temp 97.9 F (36.6 C) (Temporal)   Resp 17   Ht 5\' 3"  (1.6 m)   Wt 98 kg   SpO2 98%   BMI 38.26 kg/m  General:   Alert,  pleasant and cooperative in NAD Head:  Normocephalic and atraumatic. Neck:  Supple; no masses or thyromegaly. Lungs:  Clear throughout to auscultation, normal respiratory effort.  Heart:  +S1, +S2, Regular rate and rhythm, No  edema. Abdomen:  Soft, nontender and nondistended. Normal bowel sounds, without guarding, and without rebound.   Neurologic:  Alert and  oriented x4;  grossly normal neurologically.  Impression/Plan: Dana Bradley is here for an colonoscopy to be performed for diarrhea . Risks, benefits, limitations, and alternatives regarding  colonoscopy have been reviewed with the patient.  Questions have been answered.  All parties agreeable.   Jonathon Bellows, MD  03/20/2020, 9:06 AM

## 2020-03-21 ENCOUNTER — Telehealth: Payer: Self-pay | Admitting: *Deleted

## 2020-03-21 ENCOUNTER — Encounter: Payer: Self-pay | Admitting: Gastroenterology

## 2020-03-21 ENCOUNTER — Ambulatory Visit: Payer: Self-pay

## 2020-03-21 DIAGNOSIS — R252 Cramp and spasm: Secondary | ICD-10-CM

## 2020-03-21 DIAGNOSIS — G8929 Other chronic pain: Secondary | ICD-10-CM

## 2020-03-21 DIAGNOSIS — M6281 Muscle weakness (generalized): Secondary | ICD-10-CM

## 2020-03-21 DIAGNOSIS — M25521 Pain in right elbow: Secondary | ICD-10-CM

## 2020-03-21 LAB — SURGICAL PATHOLOGY

## 2020-03-21 NOTE — Telephone Encounter (Signed)
Called for pre visit assessment. No answer. LVM. 
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
## 2020-03-22 ENCOUNTER — Ambulatory Visit: Payer: Medicaid Other | Attending: Pain Medicine | Admitting: Pain Medicine

## 2020-03-22 ENCOUNTER — Encounter: Payer: Self-pay | Admitting: Pain Medicine

## 2020-03-22 ENCOUNTER — Other Ambulatory Visit: Payer: Self-pay

## 2020-03-22 DIAGNOSIS — M5442 Lumbago with sciatica, left side: Secondary | ICD-10-CM

## 2020-03-22 DIAGNOSIS — G894 Chronic pain syndrome: Secondary | ICD-10-CM

## 2020-03-22 DIAGNOSIS — G8929 Other chronic pain: Secondary | ICD-10-CM

## 2020-03-22 DIAGNOSIS — M549 Dorsalgia, unspecified: Secondary | ICD-10-CM

## 2020-03-22 DIAGNOSIS — M5481 Occipital neuralgia: Secondary | ICD-10-CM

## 2020-03-22 DIAGNOSIS — M5441 Lumbago with sciatica, right side: Secondary | ICD-10-CM

## 2020-03-23 ENCOUNTER — Other Ambulatory Visit: Payer: Self-pay

## 2020-03-23 ENCOUNTER — Ambulatory Visit: Payer: Self-pay

## 2020-03-23 DIAGNOSIS — M6281 Muscle weakness (generalized): Secondary | ICD-10-CM

## 2020-03-23 DIAGNOSIS — R252 Cramp and spasm: Secondary | ICD-10-CM

## 2020-03-23 DIAGNOSIS — M5440 Lumbago with sciatica, unspecified side: Secondary | ICD-10-CM

## 2020-03-23 DIAGNOSIS — M542 Cervicalgia: Secondary | ICD-10-CM
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
## 2020-03-27 ENCOUNTER — Ambulatory Visit: Payer: Self-pay

## 2020-03-27 ENCOUNTER — Other Ambulatory Visit: Payer: Self-pay

## 2020-03-27 DIAGNOSIS — R252 Cramp and spasm: Secondary | ICD-10-CM

## 2020-03-27 DIAGNOSIS — M542 Cervicalgia: Secondary | ICD-10-CM

## 2020-03-27 DIAGNOSIS — M6281 Muscle weakness (generalized): Secondary | ICD-10-CM

## 2020-03-27 DIAGNOSIS — G8929 Other chronic pain: Secondary | ICD-10-CM
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
## 2020-03-29 ENCOUNTER — Ambulatory Visit: Payer: Self-pay

## 2020-04-04 ENCOUNTER — Ambulatory Visit: Payer: Self-pay

## 2020-04-06 ENCOUNTER — Other Ambulatory Visit: Payer: Self-pay

## 2020-04-06 ENCOUNTER — Ambulatory Visit: Payer: Self-pay | Attending: Pain Medicine

## 2020-04-06 DIAGNOSIS — M6281 Muscle weakness (generalized): Secondary | ICD-10-CM | POA: Insufficient documentation

## 2020-04-06 DIAGNOSIS — R252 Cramp and spasm: Secondary | ICD-10-CM | POA: Insufficient documentation

## 2020-04-06 DIAGNOSIS — G8929 Other chronic pain: Secondary | ICD-10-CM | POA: Insufficient documentation

## 2020-04-06 DIAGNOSIS — M542 Cervicalgia: Secondary | ICD-10-CM | POA: Insufficient documentation

## 2020-04-06 DIAGNOSIS — M5440 Lumbago with sciatica, unspecified side: Secondary | ICD-10-CM | POA: Insufficient documentation
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
## 2020-04-10 ENCOUNTER — Other Ambulatory Visit: Payer: Self-pay

## 2020-04-10 ENCOUNTER — Ambulatory Visit: Payer: Self-pay

## 2020-04-10 DIAGNOSIS — M6281 Muscle weakness (generalized): Secondary | ICD-10-CM

## 2020-04-10 DIAGNOSIS — G8929 Other chronic pain: Secondary | ICD-10-CM

## 2020-04-10 DIAGNOSIS — M542 Cervicalgia: Secondary | ICD-10-CM

## 2020-04-10 DIAGNOSIS — R252 Cramp and spasm: Secondary | ICD-10-CM
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
## 2020-04-10 NOTE — Addendum Note (Signed)
Addended by: Blain Pais on: 04/10/2020 03:05 PM   Modules accepted: Orders

## 2020-04-12 ENCOUNTER — Encounter: Payer: Self-pay | Attending: Physical Medicine and Rehabilitation | Admitting: Physical Medicine and Rehabilitation

## 2020-04-12 ENCOUNTER — Encounter: Payer: Self-pay | Admitting: Physical Medicine and Rehabilitation

## 2020-04-12 ENCOUNTER — Other Ambulatory Visit: Payer: Self-pay

## 2020-04-12 ENCOUNTER — Ambulatory Visit: Payer: Self-pay

## 2020-04-12 VITALS — BP 113/77 | HR 81 | Temp 99.0°F | Ht 63.0 in | Wt 216.0 lb

## 2020-04-12 DIAGNOSIS — R112 Nausea with vomiting, unspecified: Secondary | ICD-10-CM

## 2020-04-12 DIAGNOSIS — M797 Fibromyalgia: Secondary | ICD-10-CM | POA: Insufficient documentation

## 2020-04-12 DIAGNOSIS — G43001 Migraine without aura, not intractable, with status migrainosus: Secondary | ICD-10-CM | POA: Insufficient documentation

## 2020-04-12 DIAGNOSIS — G43119 Migraine with aura, intractable, without status migrainosus: Secondary | ICD-10-CM | POA: Insufficient documentation

## 2020-04-12 DIAGNOSIS — Z6838 Body mass index (BMI) 38.0-38.9, adult: Secondary | ICD-10-CM | POA: Insufficient documentation

## 2020-04-12 DIAGNOSIS — M7918 Myalgia, other site: Secondary | ICD-10-CM | POA: Insufficient documentation

## 2020-04-12 MED ORDER — ONDANSETRON HCL 4 MG PO TABS: 4.0000 mg | ORAL_TABLET | Freq: Three times a day (TID) | ORAL | 2 refills | Status: DC | PRN

## 2020-04-12 MED ORDER — BUTALBITAL-APAP-CAFFEINE 50-325-40 MG PO TABS: 1.0000 | ORAL_TABLET | Freq: Four times a day (QID) | ORAL | 0 refills | Status: DC | PRN

## 2020-04-12 NOTE — Progress Notes (Addendum)
Subjective:    Patient ID: Dana Bradley, female    DOB: 06/25/1971, 49 y.o.   MRN: [id number]  HPI Fioricet helps her migraines last only 16-24 hours which is better than usual. She has been using the Zofran as needed for the nausea which does help. She gets ripping screaming pain in her neck, more on the left side. She has ripping burning pain above her left eye. She uses TENS the unit to help her through the pain- this interrupts the pain. The migraine pain runs down her left arm and she has difficulties with word finding. Botox did not help the migraines. She was not able to obtain the nasal spray due to prior auth- this is still in process. She keeps a migraine diary and shares this information with me.   Prior history:  Mrs Belknap reports that Botox is not helping the migraines.  Her last migraine occurred Sunday 03/05/20 and lasted until Thursday 03/09/20. She is starting to get another migraine right now. The Fioricet does help. It reduces the pain but does not make the migraine completely go away. Pain is on average 7/10 and is currently 4/10. She has 9 Fioricet left. She takes the Fioricet every 10 hours when she has a migraine. She has been taking Zofran for nausea. She has tried Elavil, Triptans, trigger point injections, RF treatment, Botox. Aimovig, Ajovy, Emgality, physical therapy. Hydrocodone is not as effective. Her occipital nerve feels like it is on fire. She has had an occipital nerve block. She has tried occipital nerve block which provided temporary relief. She minimizes sunlight and wears sunlight pretty much all the time. She has had brian imaging that was all negative. She is allergic to contrast. This is really impacting her quality of life.   Her daughter comes into her room to read to her. She has pseudotumor cerebri and she would like her to be seen here.   Pain Inventory Average Pain 5 Pain Right Now 6 My pain is intermittent, sharp, burning, stabbing, tingling, aching and  ripping  In the last 24 hours, has pain interfered with the following? General activity 8 Relation with others 8 Enjoyment of life 8 What TIME of day is your pain at its worst? varies Sleep (in general) Poor  Pain is worse with: some activites and smells and light make it worse, movement, noise Pain improves with: medication and being in the dark makes it tolerable Relief from Meds: 4  Mobility walk with assistance use a cane ability to climb steps?  no do you drive?  yes  Function not employed: date last employed .  Neuro/Psych weakness numbness tingling trouble walking dizziness anxiety  Prior Studies Any changes since last visit?  no  Physicians involved in your care Dr Dossie Arbour - pain management  (back pain), GI   Family History  Problem Relation Age of Onset  . Depression Mother   . Hypertension Mother   . Cancer Mother        Skin  . Hyperlipidemia Mother   . Anxiety disorder Mother   . Migraines Mother   . Alcohol abuse Father   . Depression Father   . Stroke Father   . Heart disease Father   . Hypertension Father   . Anxiety disorder Father   . Depression Sister   . Hyperlipidemia Sister   . Diabetes Sister   . Hypertension Sister   . Polycystic ovary syndrome Sister   . Bipolar disorder Sister   . Anxiety disorder  Sister   . Migraines Sister   . Cancer Maternal Grandmother 66       Breast  . Thyroid disease Maternal Grandmother   . Arthritis Maternal Grandmother   . Hyperlipidemia Maternal Grandmother   . Depression Sister   . Hypertension Sister   . Anxiety disorder Sister   . Migraines Sister   . Alzheimer's disease Other   . Aneurysm Maternal Grandfather   . Hypertension Maternal Grandfather   . Heart disease Maternal Grandfather   . Alzheimer's disease Paternal Grandmother   . Heart attack Paternal Grandfather   . Hypertension Paternal Grandfather   . COPD Paternal Grandfather   . Heart disease Paternal Grandfather   .  Migraines Son   . Migraines Daughter   . Migraines Daughter   . Bladder Cancer Neg Hx   . Kidney cancer Neg Hx    Social History   Socioeconomic History  . Marital status: Married    Spouse name: brian  . Number of children: 3  . Years of education: Not on file  . Highest education level: Associate degree: academic program  Occupational History  . Occupation: disbled    Comment: not able  Tobacco Use  . Smoking status: Former Smoker    Packs/day: 4.00    Years: 3.00    Pack years: 12.00    Types: Cigarettes    Quit date: 12/10/1992    Years since quitting: 27.3  . Smokeless tobacco: Never Used  . Tobacco comment: quit 25 years ago  Vaping Use  . Vaping Use: Never used  Substance and Sexual Activity  . Alcohol use: No    Alcohol/week: 0.0 standard drinks  . Drug use: No  . Sexual activity: Not Currently    Partners: Male    Birth control/protection: Surgical  Other Topics Concern  . Not on file  Social History Narrative   Lives at home with her husband and 2 of her children   Right handed   Caffeine: 0-2 cups daily   Social Determinants of Health   Financial Resource Strain:   . Difficulty of Paying Living Expenses:   Food Insecurity:   . Worried About Charity fundraiser in the Last Year:   . Arboriculturist in the Last Year:   Transportation Needs:   . Film/video editor (Medical):   Marland Kitchen Lack of Transportation (Non-Medical):   Physical Activity:   . Days of Exercise per Week:   . Minutes of Exercise per Session:   Stress:   . Feeling of Stress :   Social Connections:   . Frequency of Communication with Friends and Family:   . Frequency of Social Gatherings with Friends and Family:   . Attends Religious Services:   . Active Member of Clubs or Organizations:   . Attends Archivist Meetings:   Marland Kitchen Marital Status:    Past Surgical History:  Procedure Laterality Date  . ABLATION     Uterine  . CARDIAC CATHETERIZATION     with ablation  .  COLONOSCOPY WITH PROPOFOL N/A 05/17/2015   Procedure: COLONOSCOPY WITH PROPOFOL;  Surgeon: Manya Silvas, MD;  Location: [facility] ENDOSCOPY;  Service: Endoscopy;  Laterality: N/A;  . COLONOSCOPY WITH PROPOFOL N/A 03/20/2020   Procedure: COLONOSCOPY WITH PROPOFOL;  Surgeon: Jonathon Bellows, MD;  Location: [facility] ENDOSCOPY;  Service: Gastroenterology;  Laterality: N/A;  . ESOPHAGOGASTRODUODENOSCOPY N/A 05/17/2015   Procedure: ESOPHAGOGASTRODUODENOSCOPY (EGD);  Surgeon: Manya Silvas, MD;  Location: [facility] ENDOSCOPY;  Service:  Endoscopy;  Laterality: N/A;  . KNEE ARTHROSCOPY    . spg     6/18  . Tibial Tubercle Bypass Right 1998  . TUBAL LIGATION  10/01/99   Past Medical History:  Diagnosis Date  . Acute postoperative pain 04/07/2017  . Anxiety   . Bursitis   . Chronic fatigue 12/12/2017  . Chronic fatigue syndrome   . Edema leg 05/02/2015  . Fibromyalgia   . GERD (gastroesophageal reflux disease)   . IBS (irritable bowel syndrome)   . Knee pain, bilateral 12/21/2008   Qualifier: Diagnosis of  By: Hassell Done FNP, Tori Milks    . Lumbar discitis   . Migraines   . Osteoarthritis   . Right hand pain 04/10/2015   [facility] has done nerve conduction studies and ruled out carpal tunnel.   . Sleep apnea   . Spinal stenosis   . SVT (supraventricular tachycardia) ([place])   . Vertigo   . Vitamin D deficiency 05/01/2016   BP 113/77   Pulse 81   Temp 99 F (37.2 C)   Ht 5\' 3"  (1.6 m)   Wt 216 lb (98 kg)   SpO2 96%   BMI 38.26 kg/m   Opioid Risk Score:   Fall Risk Score:  `1  Depression screen PHQ 2/9  Depression screen [facility] 2/9 04/12/2020 03/13/2020 03/01/2020 01/24/2020 09/28/2019 09/21/2019 07/30/2019  Decreased Interest 0 1 0 0 0 1 1  Down, Depressed, Hopeless 0 0 0 0 0 1 0  PHQ - 2 Score 0 1 0 0 0 2 1  Altered sleeping - - - 0 - 1 2  Tired, decreased energy - - - 0 - 1 3  Change in appetite - - - 0 - 1 1  Feeling bad or failure about yourself  - - - 0 - 1 1  Trouble concentrating - - - 0 -  1 1  Moving slowly or fidgety/restless - - - 0 - 1 1  Suicidal thoughts - - - 0 - 0 0  PHQ-9 Score - - - 0 - 8 10  Difficult doing work/chores - - - Not difficult at all - - Somewhat difficult  Some recent data might be hidden    Review of Systems  Constitutional: Negative.   HENT: Negative.   Eyes: Negative.   Respiratory: Negative.   Cardiovascular: Negative.   Gastrointestinal: Negative.   Endocrine: Negative.   Genitourinary: Negative.   Musculoskeletal: Positive for arthralgias, back pain and myalgias.  Skin: Negative.   Allergic/Immunologic: Negative.   Neurological: Positive for numbness and headaches.       Tingling left side when migraine happens  Hematological: Negative.   Psychiatric/Behavioral: Positive for dysphoric mood.  All other systems reviewed and are negative.      Objective:   Physical Exam Gen: no distress, normal appearing HEENT: oral mucosa pink and moist, NCAT Cardio: Reg rate Chest: normal effort, normal rate of breathing Abd: soft, non-distended Ext: no edema Skin: intact Neuro: Alert and oriented x3. No word finding difficulties currently. Sitting in dark room. Ambulating with cane. Trigger points throughout bilateral muscles of neck and upper back. Left side lateral mass tenderness.  Psych: pleasant, normal affect    Assessment & Plan:  Mrs. Vitiello is a 49 year old woman with chronic intractable migraines s/p numerous treatments, severe fibromyalgia, and IBS, and nausea.    Continue Physical Therapy: Continue for cervical myofascial pain syndrome: myofascial release, postural correction, stretching and strengthening of the muscles of the neck  and upper back, development of HEP. Heating pads to muscles of upper back and neck. She has been doing HEP at home.    Exercise: Discussed that exercise is one of the most effective treatments for fibromyalgia. This will also help with her obesity. Made goal with Mrs. Hooley to walk outside her home at  least once per day, and to garden at least once per day (her favorite activity). Can use elliptical which she has at home on rainy days. The heat has been oppressive and so she has been trying to do the latter more.    Quality of Life: Discussed that Mrs. Pavlovic's greatest source of happiness is her family. This community will be essential in helping her recover from her chronic pain and to increase her daily activity. Her daughter is also suffering from similar migraines unfortunately.    Medications: Continue Baclofen for pain relief. Minimize use of Hydrocodone. She is only taking Lyrica once per day to make it last.   Migraines: Provided refill of Fioricet, which is one of the medications that helps her. She takes this at least once every time she gets a migraine. Advised to use upon migraine onset and to not use more frequently than q6H during migraine. Refilled Zofran for nausea last week, and this has been helping her. Continue metoprolol which can be helpful in migraine prophylaxis (HR is well controlled). Prescribed ergot nasal spray to try upon migraine initiation- advised no more frequently than 4 sprays per hour (still awaiting on prior auth). Discussed avoiding foods that may trigger migraines.   Cervical facet arthrosis: Will obtain cervical XR to assess if patient is a candidate for a medical branch block. Pain is worse on left side.     All questions were encouraged and answered. Follow up with me in 6 weeks.

## 2020-04-12 NOTE — Addendum Note (Signed)
Addended by: Izora Ribas on: 04/12/2020 11:36 AM   Modules accepted: Orders

## 2020-04-17 ENCOUNTER — Ambulatory Visit
Admission: RE | Admit: 2020-04-17 | Discharge: 2020-04-17 | Disposition: A | Discharge status: Home / Self Care | Payer: Self-pay | Source: Ambulatory Visit | Attending: Physical Medicine and Rehabilitation | Admitting: Physical Medicine and Rehabilitation

## 2020-04-17 ENCOUNTER — Ambulatory Visit
Admission: RE | Admit: 2020-04-17 | Discharge: 2020-04-17 | Disposition: A | Discharge status: Home / Self Care | Payer: Self-pay | Attending: Physical Medicine and Rehabilitation | Admitting: Physical Medicine and Rehabilitation

## 2020-04-17 ENCOUNTER — Other Ambulatory Visit: Payer: Self-pay | Admitting: Pain Medicine

## 2020-04-17 ENCOUNTER — Ambulatory Visit: Payer: Self-pay

## 2020-04-17 ENCOUNTER — Other Ambulatory Visit: Payer: Self-pay

## 2020-04-17 ENCOUNTER — Ambulatory Visit (INDEPENDENT_AMBULATORY_CARE_PROVIDER_SITE_OTHER): Payer: Medicaid Other | Admitting: Gastroenterology

## 2020-04-17 VITALS — BP 118/73 | HR 76 | Temp 98.3°F | Ht 63.0 in | Wt 221.0 lb

## 2020-04-17 DIAGNOSIS — R197 Diarrhea, unspecified: Secondary | ICD-10-CM

## 2020-04-17 DIAGNOSIS — M7918 Myalgia, other site: Secondary | ICD-10-CM

## 2020-04-17 DIAGNOSIS — M5440 Lumbago with sciatica, unspecified side: Secondary | ICD-10-CM

## 2020-04-17 DIAGNOSIS — R252 Cramp and spasm: Secondary | ICD-10-CM

## 2020-04-17 DIAGNOSIS — K580 Irritable bowel syndrome with diarrhea: Secondary | ICD-10-CM

## 2020-04-17 DIAGNOSIS — R109 Unspecified abdominal pain: Secondary | ICD-10-CM

## 2020-04-17 DIAGNOSIS — M6281 Muscle weakness (generalized): Secondary | ICD-10-CM

## 2020-04-17 DIAGNOSIS — M797 Fibromyalgia: Secondary | ICD-10-CM

## 2020-04-17 DIAGNOSIS — M542 Cervicalgia: Secondary | ICD-10-CM

## 2020-04-17 NOTE — Progress Notes (Signed)
Jonathon Bellows MD, MRCP(U.K) [address]  [place]  [place], [place] [address]  Main: [phone]  Fax: [phone]   Primary Care Physician: Delsa Grana, PA-C  Primary Gastroenterologist:  Dr. Jonathon Bellows   Chief Complaint  Patient presents with  . Follow-up    Diarrhea    HPI: Dana Bradley is a 49 y.o. female   Summary of history : Initially referred and seen on 11/10/2019 for abdominal pain and diarrhea. She was previously a patient of [facility] gastroenterology. Known to have internal hemorrhoids per her last colonoscopy in 2016,  acid reflux with esophagitis in 2016 per her endoscopy.   She was seen back in December 2020by her family nurse practitioner for abdominal pain. Ongoing since August along with diarrhea.She has a longstanding history of irritable bowel syndrome with diarrhea.  She consumes some sweet and low in her drinks daily. She is up to 2 or 3 bowel movements which are very runny sticky and smelly each day. Sometimes appear to be pale-colored. She takes Dexilant for her acid reflux. She says she has generalized abdominal pain on and off crampy in nature associated with watery diarrhea and relieved after bowel movement. Stress test make it worse.  10/18/2019 CT scan of the abdomen and pelvis with contrast demonstrates mild hepatic steatosis but no acute intra-abdominal pathology.  09/21/2019: CBC: Normal: Acute hepatitis panel negative. CMP normal. November 2020 GI PCR negative. 12/07/2019: H. pylori breath test, celiac serology negative.  Stool for C. difficile as well as fecal calprotectin negative.   Interval history 03/08/2020 2719 2021 03/20/2020 colonoscopy: No polyps seen no colitis noted endoscopically biopsies of the terminal ileum as well as random colon biopsies were completely normal.   Since her last visit she is doing much better.  She is taking Creon.  He managed to get a 1 year supply of medication.  Still having bloating.  Avoiding  excess consumption of diet soda.  Takes the Bentyl for cramping.  Helps but sometimes causes a bit of constipation.  Not taking any magnesium oxide.  On a PPI.   Current Outpatient Medications  Medication Sig Dispense Refill  . atorvastatin (LIPITOR) 40 MG tablet Take 1 tablet (40 mg total) by mouth at bedtime. 90 tablet 3  . baclofen (LIORESAL) 10 MG tablet Take 1 tablet (10 mg total) by mouth 3 (three) times daily. 90 tablet 5  . butalbital-acetaminophen-caffeine (FIORICET) 50-325-40 MG tablet Take 1-2 tablets by mouth every 6 (six) hours as needed for headache. 20 tablet 0  . Cyanocobalamin (VITAMIN B-12) 500 MCG SUBL Place 1,500 mcg under the tongue daily.  150 tablet   . Dexlansoprazole (DEXILANT) 30 MG capsule Take 1 capsule (30 mg total) by mouth daily. This replaces omeprazole 30 capsule 2  . diclofenac sodium (VOLTAREN) 1 % GEL Apply 2 g topically 4 (four) times daily. (Patient taking differently: Apply 2 g topically 4 (four) times daily. As needed.) 350 g 3  . diclofenac Sodium (VOLTAREN) 1 % GEL Apply 2 g topically 4 (four) times daily as needed. 350 g PRN  . dicyclomine (BENTYL) 10 MG capsule Take 1 capsule (10 mg total) by mouth 4 (four) times daily -  before meals and at bedtime. As needed. 360 capsule 1  . dihydroergotamine (MIGRANAL) 4 MG/ML nasal spray Place 1 spray into the nose as needed for migraine. Use in one nostril as directed.  No more than 4 sprays in one hour 8 mL 12  . HYDROcodone-acetaminophen (NORCO) 7.5-325 MG tablet  Take 1 tablet by mouth every 6 (six) hours as needed for severe pain. Must last 90 days 120 tablet 0  . HYDROcodone-acetaminophen (NORCO) 7.5-325 MG tablet Take 1 tablet by mouth every 6 (six) hours as needed for severe pain. Must last 90 days 120 tablet 0  . [START ON 04/30/2020] HYDROcodone-acetaminophen (NORCO) 7.5-325 MG tablet Take 1 tablet by mouth every 6 (six) hours as needed for severe pain. Must last 90 days 120 tablet 0  . magnesium oxide (MAG-OX)  400 MG tablet Take 300 mg by mouth daily.     . Melatonin 10 MG TABS Take 10 mg by mouth at bedtime.    . metoprolol tartrate (LOPRESSOR) 50 MG tablet Take 1 tablet (50 mg total) by mouth 2 (two) times daily. 180 tablet 3  . mupirocin ointment (BACTROBAN) 2 % Apply 1 application topically 2 (two) times daily as needed. 22 g 0  . omeprazole (PRILOSEC) 40 MG capsule Take 1 capsule (40 mg total) by mouth daily. 90 capsule 0  . ondansetron (ZOFRAN) 4 MG tablet Take 1 tablet (4 mg total) by mouth every 8 (eight) hours as needed. 20 tablet 2  . pregabalin (LYRICA) 150 MG capsule Take 1 capsule (150 mg total) by mouth every 8 (eight) hours. 270 capsule 1  . VALERIAN PO Take by mouth as needed. Makes Valerian tea about 3-4 times per week.    . Vitamin D, Ergocalciferol, (DRISDOL) 1.25 MG (50000 UNIT) CAPS capsule Take 1 capsule (50,000 Units total) by mouth 2 (two) times a week. x12 weeks. 24 capsule 2   No current facility-administered medications for this visit.    Allergies as of 04/17/2020 - Review Complete 04/17/2020  Allergen Reaction Noted  . Aspirin Swelling 06/13/2008  . Cymbalta [duloxetine hcl] Other (See Comments) 09/09/2016  . Depakote [divalproex sodium] Shortness Of Breath 12/10/2012  . Gadolinium derivatives  07/20/2019  . Haloperidol Shortness Of Breath 05/15/2015  . Meperidine Nausea And Vomiting 12/10/2012  . Reglan [metoclopramide] Shortness Of Breath 09/16/2016  . Tramadol hcl Palpitations 09/09/2016  . Trazodone Shortness Of Breath 05/15/2015  . Compazine [prochlorperazine] Other (See Comments) 09/16/2016  . Meloxicam Other (See Comments) 03/17/2014  . Penicillins Rash 06/13/2008  . Tomato Hives 04/29/2016  . Other  03/17/2014  . Shellfish allergy  03/17/2014  . Shellfish-derived products Other (See Comments) 01/23/2015  . Bacitracin-neomycin-polymyxin Rash 05/15/2015  . Cephalosporins Rash 05/15/2015  . Ibuprofen Other (See Comments) and Rash 01/05/2013  . Latex  Itching 03/17/2014  . Neosporin [neomycin-bacitracin zn-polymyx] Rash 12/10/2012  . Nsaids Other (See Comments) 03/17/2014  . Sulfa antibiotics Rash 03/17/2014  . Sulfonamide derivatives Rash 06/13/2008    ROS:  General: Negative for anorexia, weight loss, fever, chills, fatigue, weakness. ENT: Negative for hoarseness, difficulty swallowing , nasal congestion. CV: Negative for chest pain, angina, palpitations, dyspnea on exertion, peripheral edema.  Respiratory: Negative for dyspnea at rest, dyspnea on exertion, cough, sputum, wheezing.  GI: See history of present illness. GU:  Negative for dysuria, hematuria, urinary incontinence, urinary frequency, nocturnal urination.  Endo: Negative for unusual weight change.    Physical Examination:   BP 118/73   Pulse 76   Temp 98.3 F (36.8 C)   Ht 5\' 3"  (1.6 m)   Wt 221 lb (100.2 kg)   BMI 39.15 kg/m   General: Well-nourished, well-developed in no acute distress.  Eyes: No icterus. Conjunctivae pink. Neuro: Alert and oriented x 3.  Grossly intact. Skin: Warm and dry, no jaundice.   Psych:  Alert and cooperative, normal mood and affect.   Imaging Studies: No results found.  Assessment and Plan:   Dana Bradley is a 49 y.o. y/o femalehere to follow-up for abdominal pain and diarrhea.Likely a combination of gas bloating and irritable bowel syndrome with diarrhea. 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  Responding well to Creon.  He will having some residual symptoms but overall much better.  Completed a course of Xifaxan.  Uses Bentyl for cramping.  Occasional constipation.  Also complains of sulfurous breath.  Plan 1.     Trial of activated charcoal. 2.  Stop Bentyl and try IBgard.  Explained rationale is to try and stop prescription medications if possible. 3.  I will start her on Zenpep which is no different from Creon but has a slightly higher dosage of pancreatic lipase.  If she responds better to a higher dosage  then will need to change her dose.  2 weeks samples provided. 4.  Continue low FODMAP diet. 5.  Avoid artificial sugars. 6.  If IBgard works she can substituted with peppermint which she grows herself.  There is a shorter it helps and IBS-like symptoms.  Dr Jonathon Bellows  MD,MRCP [facility]) Follow up in 6 to 8 weeks telephone visit
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
## 2020-04-18 ENCOUNTER — Encounter: Payer: Self-pay | Admitting: Family Medicine

## 2020-04-18 ENCOUNTER — Other Ambulatory Visit: Payer: Self-pay

## 2020-04-18 ENCOUNTER — Other Ambulatory Visit: Payer: Self-pay | Admitting: Family Medicine

## 2020-04-18 DIAGNOSIS — I10 Essential (primary) hypertension: Secondary | ICD-10-CM

## 2020-04-18 MED ORDER — METOPROLOL TARTRATE 50 MG PO TABS: 50.0000 mg | ORAL_TABLET | Freq: Two times a day (BID) | ORAL | 3 refills | Status: DC

## 2020-04-19 ENCOUNTER — Telehealth: Payer: Self-pay

## 2020-04-19 ENCOUNTER — Other Ambulatory Visit: Payer: Self-pay

## 2020-04-19 ENCOUNTER — Ambulatory Visit: Payer: Self-pay

## 2020-04-19 DIAGNOSIS — M6281 Muscle weakness (generalized): Secondary | ICD-10-CM

## 2020-04-19 DIAGNOSIS — R252 Cramp and spasm: Secondary | ICD-10-CM

## 2020-04-19 DIAGNOSIS — M5440 Lumbago with sciatica, unspecified side: Secondary | ICD-10-CM

## 2020-04-19 NOTE — Telephone Encounter (Signed)
Patient called for her xray results. She is aware you are out of the office. But thinks she may have a missed call from you. If so, please call back.   Thank you.
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
## 2020-04-20 ENCOUNTER — Telehealth: Payer: Self-pay | Admitting: Pharmacist

## 2020-04-20 NOTE — Telephone Encounter (Signed)
04/20/2020 11:15:17 AM - Leanora Cover for refill on Creon  -- Elmer Picker - Thursday, April 20, 2020 11:15 AM --Leanora Cover spoke with [facility] for refill on Creon--allow 7-10 business day to ship to our office. Patient enrolled till 03/10/2021, and next refill due 05/14/2021.

## 2020-04-24 ENCOUNTER — Ambulatory Visit: Payer: Self-pay

## 2020-04-24 ENCOUNTER — Other Ambulatory Visit: Payer: Self-pay

## 2020-04-24 DIAGNOSIS — M6281 Muscle weakness (generalized): Secondary | ICD-10-CM

## 2020-04-24 DIAGNOSIS — R252 Cramp and spasm: Secondary | ICD-10-CM

## 2020-04-24 DIAGNOSIS — G8929 Other chronic pain: Secondary | ICD-10-CM
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
## 2020-04-26 ENCOUNTER — Other Ambulatory Visit: Payer: Self-pay

## 2020-04-26 ENCOUNTER — Ambulatory Visit: Payer: Self-pay

## 2020-04-26 DIAGNOSIS — M6281 Muscle weakness (generalized): Secondary | ICD-10-CM

## 2020-04-26 DIAGNOSIS — M542 Cervicalgia: Secondary | ICD-10-CM

## 2020-04-26 DIAGNOSIS — M5440 Lumbago with sciatica, unspecified side: Secondary | ICD-10-CM

## 2020-04-26 DIAGNOSIS — R252 Cramp and spasm: Secondary | ICD-10-CM
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
## 2020-05-06 NOTE — Progress Notes (Signed)
PROVIDER NOTE: Information contained herein reflects review and annotations entered in association with encounter. Interpretation of such information and data should be left to medically-trained personnel. Information provided to patient can be located elsewhere in the medical record under "Patient Instructions". Document created using STT-dictation technology, any transcriptional errors that may result from process are unintentional.    Patient: Dana Bradley  Service Category: E/M  Provider: Gaspar Cola, MD  DOB: 14-Jun-1971  DOS: 05/08/2020  Specialty: Interventional Pain Management  MRN: [id number]  Setting: Ambulatory outpatient  PCP: Delsa Grana, PA-C  Type: Established Patient    Referring Provider: Delsa Grana, PA-C  Location: Office  Delivery: Face-to-face     HPI  Reason for encounter: Ms. Kimisha Eunice, a 49 y.o. year old female, is here today for evaluation and management of her Chronic pain syndrome [G89.4]. Ms. Robison primary complain today is Back Pain Last encounter: Practice (04/17/2020). My last encounter with her was on 04/17/2020. Pertinent problems: Ms. Laatsch has Cervico-occipital neuralgia; DDD (degenerative disc disease), lumbosacral; Chronic pain of multiple joints; DDD (degenerative disc disease), lumbar; Fibromyalgia; Migraine without aura and with status migrainosus, not intractable; Chronic lower extremity cramps (Bilateral) (R>L); Chronic pain syndrome; Neurogenic pain; Chronic low back pain (Primary Area of Pain) (Bilateral) (R>L) (midline); Chronic upper back pain (Secondary area of Pain) (Bilateral) (L>R); Chronic abdominal pain (Right lower quadrant); Thoracic radiculitis (Bilateral: T10, T11); Chronic occipital neuralgia (Third area of Pain) (Bilateral) (L>R); Chronic neck pain; Chronic cervical radicular pain (Bilateral) (L>R); Chronic shoulder blade pain (Bilateral) (L>R); Chronic upper extremity pain (Bilateral) (R>L); Chronic knee pain (Bilateral) (R>L); Chronic ankle  pain (Bilateral); Cervical spondylosis with myelopathy and radiculopathy; Nephrolithiasis; Plantar fasciitis of left foot; Cervicogenic headache; Bilateral leg edema; Chronic hip pain (Left); Chronic sacroiliac joint pain (Left); Lumbar facet syndrome (Bilateral) (L>R); Lumbar radiculitis (Left); Lumbar spondylosis; Migraine headache; Muscle spasticity; Osteoarthritis of shoulder (Bilateral); Lumbar L1-2 disc protrusion (Right); Myofascial pain syndrome (Left) (trapezius muscle); Trigger point of shoulder region (Left); Chronic fatigue syndrome with fibromyalgia; Occipital headache; Trigger point of neck (Left); Malar rash; Chronic migraine without aura, with intractable migraine, so stated, with status migrainosus; Weakness of leg (Left); Difficulty walking; Migraine with aura and with status migrainosus, not intractable; Cervico-occipital neuralgia of left side; DDD (degenerative disc disease), cervical; Cervical facet syndrome (Bilateral) (L>R); DDD (degenerative disc disease), thoracic; Osteoarthritis of hip (Left); Chronic groin pain (Bilateral) (L>R); Chronic hip pain (Bilateral) (L>R); Somatic dysfunction of sacroiliac joint (Bilateral); Other specified dorsopathies, sacral and sacrococcygeal region; Intractable migraine with aura without status migrainosus; Chronic lower extremity pain (Bilateral); Lumbar radiculitis (Right); Neurogenic urinary incontinence; Other intervertebral disc degeneration, lumbar region; Spinal stenosis of lumbosacral region; and Hyperalgesia on their pertinent problem list. Pain Assessment: Severity of Chronic pain is reported as a 5 /10. Location: Generalized Other (Comment) (She hurts everywhere.)/ . Onset: More than a month ago. Quality: Aching, Stabbing, Pressure. Timing: Constant. Modifying factor(s): nothing.. Vitals:  height is '5\' 3"'$  (1.6 m) and weight is 220 lb (99.8 kg). Her temporal temperature is 98.3 F (36.8 C). Her blood pressure is 131/81 and her pulse is 80. Her  respiration is 16 and oxygen saturation is 98%.    The patient indicates doing well with the current medication regimen. No adverse reactions or side effects reported to the medications.  The patient indicates that she is really not using as much pain medication. This appears to be primarily her choice.  In fact, she brings in today a bottle for her hydrocodone which was  failed on March 01, 2020 and still has 108 of the 120 tablets. (108/120) This means that aware period of 68 days, she used a total of 12 pills.  I believe that if my math is correct, this means that she is using 1 pill every 5 days or approximately 6 pills/month.  If this is correct, then when she has left, should last about 18 months.  My impression is that this is not the case.  However, we will be doing no further opioid refills until she completely runs out.  Today the patient indicates having pain in the lower back.  The last time where we did for her was a bilateral L2 transforaminal ESI which provided her with excellent relief of the pain and she has expressed her desire to have this repeated.  We will go ahead and put her on the schedule to that as soon as possible.  Today we also talked to the patient about transferring her nonopioids to her primary care physician so that they can help [place] with this part of her care.  Transfer: Pregabalin (Lyrica) 150 mg capsule, 1 capsule p.o. every 8 hours (#90/month) (has enough to last until 09/15/2020); baclofen (Lioresal) 10 mg tablet, 1 3 times daily (#90/month) (has enough to last until 08/05/2020); diclofenac sodium (Voltaren) 1% gel, 2 g 4 times daily (350 g/month) (has enough to last until 08/28/2020) (the patient gets this medications from the medication management clinic in [place]).  Pharmacotherapy Assessment   Analgesic: Hydrocodone/APAP 7.5/325, 1 tab PO q 6 hrs. she appears to be using less than 10 pills/month. MME/day:3.'75mg'$ /day.   Monitoring:  PMP: PDMP reviewed during  this encounter.       Pharmacotherapy: No side-effects or adverse reactions reported. Compliance: No problems identified. Effectiveness: Clinically acceptable.  No notes on file  UDS:  Summary  Date Value Ref Range Status  03/05/2018 FINAL  Final    Comment:    ==================================================================== TOXASSURE SELECT 13 (MW) ==================================================================== Test                             Result       Flag       Units Drug Present   Hydrocodone                    422                     ng/mg creat   Hydromorphone                  105                     ng/mg creat   Norhydrocodone                 312                     ng/mg creat    Sources of hydrocodone include scheduled prescription    medications. Hydromorphone and norhydrocodone are expected    metabolites of hydrocodone. Hydromorphone is also available as a    scheduled prescription medication. ==================================================================== Test                      Result    Flag   Units      Ref Range   Creatinine  148              mg/dL      >=20 ==================================================================== Declared Medications:  Medication list was not provided. ==================================================================== For clinical consultation, please call [phone]. ====================================================================      ROS  Constitutional: Denies any fever or chills Gastrointestinal: No reported hemesis, hematochezia, vomiting, or acute GI distress Musculoskeletal: Denies any acute onset joint swelling, redness, loss of ROM, or weakness Neurological: No reported episodes of acute onset apraxia, aphasia, dysarthria, agnosia, amnesia, paralysis, loss of coordination, or loss of consciousness  Medication Review  Dexlansoprazole, HYDROcodone-acetaminophen, Melatonin,  Valerian, Vitamin B-12, Vitamin D (Ergocalciferol), atorvastatin, baclofen, butalbital-acetaminophen-caffeine, diclofenac Sodium, dicyclomine, dihydroergotamine, magnesium oxide, metoprolol tartrate, mupirocin ointment, omeprazole, ondansetron, and pregabalin  History Review  Allergy: Ms. Heffernan is allergic to aspirin, cymbalta [duloxetine hcl], depakote [divalproex sodium], gadolinium derivatives, haloperidol, meperidine, reglan [metoclopramide], tramadol hcl, trazodone, compazine [prochlorperazine], meloxicam, penicillins, tomato, other, shellfish allergy, shellfish-derived products, bacitracin-neomycin-polymyxin, cephalosporins, ibuprofen, latex, neosporin [neomycin-bacitracin zn-polymyx], nsaids, sulfa antibiotics, and sulfonamide derivatives. Drug: Ms. Qian  reports no history of drug use. Alcohol:  reports no history of alcohol use. Tobacco:  reports that she quit smoking about 27 years ago. Her smoking use included cigarettes. She has a 12.00 pack-year smoking history. She has never used smokeless tobacco. Social: Ms. Salvador  reports that she quit smoking about 27 years ago. Her smoking use included cigarettes. She has a 12.00 pack-year smoking history. She has never used smokeless tobacco. She reports that she does not drink alcohol and does not use drugs. Medical:  has a past medical history of Acute postoperative pain (04/07/2017), Anxiety, Bursitis, Chronic fatigue (12/12/2017), Chronic fatigue syndrome, Edema leg (05/02/2015), Fibromyalgia, GERD (gastroesophageal reflux disease), IBS (irritable bowel syndrome), Knee pain, bilateral (12/21/2008), Lumbar discitis, Migraines, Osteoarthritis, Right hand pain (04/10/2015), Sleep apnea, Spinal stenosis, SVT (supraventricular tachycardia) ([place]), Vertigo, and Vitamin D deficiency (05/01/2016). Surgical: Ms. Delatorre  has a past surgical history that includes Ablation; Tubal ligation (10/01/99); Cardiac catheterization; Knee arthroscopy; Colonoscopy with propofol  (N/A, 05/17/2015); Esophagogastroduodenoscopy (N/A, 05/17/2015); spg; Tibial Tubercle Bypass (Right, 1998); and Colonoscopy with propofol (N/A, 03/20/2020). Family: family history includes Alcohol abuse in her father; Alzheimer's disease in her paternal grandmother and another family member; Aneurysm in her maternal grandfather; Anxiety disorder in her father, mother, sister, and sister; Arthritis in her maternal grandmother; Bipolar disorder in her sister; COPD in her paternal grandfather; Cancer in her mother; Cancer (age of onset: 42) in her maternal grandmother; Depression in her father, mother, sister, and sister; Diabetes in her sister; Heart attack in her paternal grandfather; Heart disease in her father, maternal grandfather, and paternal grandfather; Hyperlipidemia in her maternal grandmother, mother, and sister; Hypertension in her father, maternal grandfather, mother, paternal grandfather, sister, and sister; Migraines in her daughter, daughter, mother, sister, sister, and son; Polycystic ovary syndrome in her sister; Stroke in her father; Thyroid disease in her maternal grandmother.  Laboratory Chemistry Profile   Renal Lab Results  Component Value Date   BUN 9 02/22/2020   CREATININE 0.65 37/06/6268   BCR NOT APPLICABLE 48/54/6270   GFRAA >60 02/22/2020   GFRNONAA >60 02/22/2020     Hepatic Lab Results  Component Value Date   AST 28 02/22/2020   ALT 37 02/22/2020   ALBUMIN 4.1 02/22/2020   ALKPHOS 83 02/22/2020   HCVAB NON REACTIVE 09/21/2019     Electrolytes Lab Results  Component Value Date   NA 140 02/22/2020   K 3.8 02/22/2020   CL 105  02/22/2020   CALCIUM 9.3 02/22/2020   MG 2.1 04/01/2017     Bone Lab Results  Component Value Date   VD25OH 16.49 (L) 02/22/2020   25OHVITD1 20 (L) 04/01/2017   25OHVITD2 5.4 04/01/2017   25OHVITD3 15 04/01/2017     Inflammation (CRP: Acute Phase) (ESR: Chronic Phase) Lab Results  Component Value Date   CRP 6.0 07/30/2018    ESRSEDRATE 17 07/30/2018       Note: Above Lab results reviewed.  Recent Imaging Review  DG Cervical Spine 2 or 3 views CLINICAL DATA:  Neck pain. Migraine headaches. No known injury.  EXAM: CERVICAL SPINE - 2-3 VIEW  COMPARISON:  07/09/2019  FINDINGS: Minimal anterior spur formation at the C6-7 level. Otherwise, unremarkable bones and soft tissues.  IMPRESSION: Minimal degenerative change at the C6-7 level.  Electronically Signed   By: Claudie Revering M.D.   On: 04/18/2020 16:19 Note: Reviewed        Physical Exam  General appearance: Well nourished, well developed, and well hydrated. In no apparent acute distress Mental status: Alert, oriented x 3 (person, place, & time)       Respiratory: No evidence of acute respiratory distress Eyes: PERLA Vitals: BP 131/81 (BP Location: Right Arm, Patient Position: Sitting, Cuff Size: Normal)    Pulse 80    Temp 98.3 F (36.8 C) (Temporal)    Resp 16    Ht '5\' 3"'$  (1.6 m)    Wt 220 lb (99.8 kg)    SpO2 98%    BMI 38.97 kg/m  BMI: Estimated body mass index is 38.97 kg/m as calculated from the following:   Height as of this encounter: '5\' 3"'$  (1.6 m).   Weight as of this encounter: 220 lb (99.8 kg). Ideal: Ideal body weight: 52.4 kg (115 lb 8.3 oz) Adjusted ideal body weight: 71.4 kg (157 lb 5 oz)  Assessment   Status Diagnosis  Controlled Persistent Persistent 1. Chronic pain syndrome   2. Chronic low back pain (Primary Area of Pain) (Bilateral) (R>L) (midline)   3. Chronic upper back pain (Secondary area of Pain) (Bilateral) (L>R)   4. Chronic occipital neuralgia (Third area of Pain) (Bilateral) (L>R)   5. DDD (degenerative disc disease), lumbar   6. Lumbar L1-2 disc protrusion (Right)   7. Pharmacologic therapy      Updated Problems: No problems updated.  Plan of Care  Problem-specific:  No problem-specific Assessment & Plan notes found for this encounter.  Ms. Jakki Doughty has a current medication list which includes the  following long-term medication(s): atorvastatin, baclofen, dexilant, diclofenac sodium, dicyclomine, dihydroergotamine, hydrocodone-acetaminophen, metoprolol tartrate, omeprazole, and pregabalin.  Pharmacotherapy (Medications Ordered): No orders of the defined types were placed in this encounter.  Orders:  Orders Placed This Encounter  Procedures   Lumbar Transforaminal Epidural    Standing Status:   Future    Standing Expiration Date:   06/08/2020    Scheduling Instructions:     Side: Bilateral     Level: L2     Sedation: With Sedation.     Timeframe: ASAP    Order Specific Question:   Where will this procedure be performed?    Answer:   [facility]   Ambulatory referral to Physical Therapy    Referral Priority:   Routine    Referral Type:   Physical Medicine    Referral Reason:   Specialty Services Required    Requested Specialty:   Physical Therapy    Number of Visits  Requested:   1   Follow-up plan:   Return for Procedure (w/ sedation): (B) L2 TFESI #2.      Interventional treatment options:  Under consideration:  Neurosurgical referral for decompressive laminectomy and/or discectomy   Therapeutic/palliative (PRN): Diagnostic left SI joint block #2  Diagnostic/therapeutic left IA hip joint injection #2  Diagnostic bilateral lumbar facet block #2  Palliative left trapezius muscle MNB #4 Palliative right trapezius muscle MNB #2 Palliative left L1-2 LESI #2 Palliative right L1-2 LESI #2 Palliativeleft CESI #3 Palliative left greater occipital NB #3  Palliative left C2 + TON NB #2  Palliative left GON + C2 + TON RFA #2 (last done 12/25/2017) Diagnostic/therapeutic bilateral L2 TFESI #2 (done on 03/07/2020) (100/100/100/>50)    Recent Visits Date Type Provider Dept  05/08/20 Office Visit Milinda Pointer, MD [facility]  03/22/20 Telemedicine Milinda Pointer, MD [facility]  03/07/20 Procedure visit Milinda Pointer, MD  [facility]  03/01/20 Office Visit Milinda Pointer, MD [facility]  Showing recent visits within past 90 days and meeting all other requirements Future Appointments Date Type Provider Dept  06/01/20 Appointment Milinda Pointer, MD [facility]  Showing future appointments within next 90 days and meeting all other requirements  I discussed the assessment and treatment plan with the patient. The patient was provided an opportunity to ask questions and all were answered. The patient agreed with the plan and demonstrated an understanding of the instructions.  Patient advised to call back or seek an in-person evaluation if the symptoms or condition worsens.  Duration of encounter: 30 minutes.  Note by: Gaspar Cola, MD Date: 05/08/2020; Time: 5:11 PM

## 2020-05-06 NOTE — Patient Instructions (Addendum)
____________________________________________________________________________________________  Drug Holidays (Slow)  What is a "Drug Holiday"? Drug Holiday: is the name given to the period of time during which a patient stops taking a medication(s) for the purpose of eliminating tolerance to the drug.  Benefits . Improved effectiveness of opioids. . Decreased opioid dose needed to achieve benefits. . Improved pain with lesser dose.  What is tolerance? Tolerance: is the progressive decreased in effectiveness of a drug due to its repetitive use. With repetitive use, the body gets use to the medication and as a consequence, it loses its effectiveness. This is a common problem seen with opioid pain medications. As a result, a larger dose of the drug is needed to achieve the same effect that used to be obtained with a smaller dose.  How long should a "Drug Holiday" last? You should stay off of the pain medicine for at least 14 consecutive days. (2 weeks)  Should I stop the medicine "cold turkey"? No. You should always coordinate with your Pain Specialist so that he/she can provide you with the correct medication dose to make the transition as smoothly as possible.  How do I stop the medicine? Slowly. You will be instructed to decrease the daily amount of pills that you take by one (1) pill every seven (7) days. This is called a "slow downward taper" of your dose. For example: if you normally take four (4) pills per day, you will be asked to drop this dose to three (3) pills per day for seven (7) days, then to two (2) pills per day for seven (7) days, then to one (1) per day for seven (7) days, and at the end of those last seven (7) days, this is when the "Drug Holiday" would start.   Will I have withdrawals? By doing a "slow downward taper" like this one, it is unlikely that you will experience any significant withdrawal symptoms. Typically, what triggers withdrawals is the sudden stop of a high  dose opioid therapy. Withdrawals can usually be avoided by slowly decreasing the dose over a prolonged period of time. If you do not follow these instructions and decide to stop your medication abruptly, withdrawals may be possible.  What are withdrawals? Withdrawals: refers to the wide range of symptoms that occur after stopping or dramatically reducing opiate drugs after heavy and prolonged use. Withdrawal symptoms do not occur to patients that use low dose opioids, or those who take the medication sporadically. Contrary to benzodiazepine (example: Valium, Xanax, etc.) or alcohol withdrawals ("Delirium Tremens"), opioid withdrawals are not lethal. Withdrawals are the physical manifestation of the body getting rid of the excess receptors.  Expected Symptoms Early symptoms of withdrawal may include: . Agitation . Anxiety . Muscle aches . Increased tearing . Insomnia . Runny nose . Sweating . Yawning  Late symptoms of withdrawal may include: . Abdominal cramping . Diarrhea . Dilated pupils . Goose bumps . Nausea . Vomiting  Will I experience withdrawals? Due to the slow nature of the taper, it is very unlikely that you will experience any.  What is a slow taper? Taper: refers to the gradual decrease in dose.  (Last update: 04/19/2020) ____________________________________________________________________________________________    ____________________________________________________________________________________________  Medication Rules  Purpose: To inform patients, and their family members, of our rules and regulations.  Applies to: All patients receiving prescriptions (written or electronic).  Pharmacy of record: Pharmacy where electronic prescriptions will be sent. If written prescriptions are taken to a different pharmacy, please inform the nursing staff. The pharmacy   listed in the electronic medical record should be the one where you would like electronic prescriptions  to be sent.  Electronic prescriptions: In compliance with the [place] Strengthen Opioid Misuse Prevention (STOP) Act of 2017 (Session Law 2017-74/H243), effective September 30, 2018, all controlled substances must be electronically prescribed. Calling prescriptions to the pharmacy will cease to exist.  Prescription refills: Only during scheduled appointments. Applies to all prescriptions.  NOTE: The following applies primarily to controlled substances (Opioid* Pain Medications).   Type of encounter (visit): For patients receiving controlled substances, face-to-face visits are required. (Not an option or up to the patient.)  Patient's responsibilities: 1. Pain Pills: Bring all pain pills to every appointment (except for procedure appointments). 2. Pill Bottles: Bring pills in original pharmacy bottle. Always bring the newest bottle. Bring bottle, even if empty. 3. Medication refills: You are responsible for knowing and keeping track of what medications you take and those you need refilled. The day before your appointment: write a list of all prescriptions that need to be refilled. The day of the appointment: give the list to the admitting nurse. Prescriptions will be written only during appointments. No prescriptions will be written on procedure days. If you forget a medication: it will not be "Called in", "Faxed", or "electronically sent". You will need to get another appointment to get these prescribed. No early refills. Do not call asking to have your prescription filled early. 4. Prescription Accuracy: You are responsible for carefully inspecting your prescriptions before leaving our office. Have the discharge nurse carefully go over each prescription with you, before taking them home. Make sure that your name is accurately spelled, that your address is correct. Check the name and dose of your medication to make sure it is accurate. Check the number of pills, and the written instructions to  make sure they are clear and accurate. Make sure that you are given enough medication to last until your next medication refill appointment. 5. Taking Medication: Take medication as prescribed. When it comes to controlled substances, taking less pills or less frequently than prescribed is permitted and encouraged. Never take more pills than instructed. Never take medication more frequently than prescribed.  6. Inform other Doctors: Always inform, all of your healthcare providers, of all the medications you take. 7. Pain Medication from other Providers: You are not allowed to accept any additional pain medication from any other Doctor or Healthcare provider. There are two exceptions to this rule. (see below) In the event that you require additional pain medication, you are responsible for notifying us, as stated below. 8. Medication Agreement: You are responsible for carefully reading and following our Medication Agreement. This must be signed before receiving any prescriptions from our practice. Safely store a copy of your signed Agreement. Violations to the Agreement will result in no further prescriptions. (Additional copies of our Medication Agreement are available upon request.) 9. Laws, Rules, & Regulations: All patients are expected to follow all Federal and State Laws, Statutes, Rules, & Regulations. Ignorance of the Laws does not constitute a valid excuse.  10. Illegal drugs and Controlled Substances: The use of illegal substances (including, but not limited to marijuana and its derivatives) and/or the illegal use of any controlled substances is strictly prohibited. Violation of this rule may result in the immediate and permanent discontinuation of any and all prescriptions being written by our practice. The use of any illegal substances is prohibited. 11. Adopted CDC guidelines & recommendations: Target dosing levels will be at or   below 60 MME/day. Use of benzodiazepines** is not  recommended.  Exceptions: There are only two exceptions to the rule of not receiving pain medications from other Healthcare Providers. 1. Exception #1 (Emergencies): In the event of an emergency (i.e.: accident requiring emergency care), you are allowed to receive additional pain medication. However, you are responsible for: As soon as you are able, call our office [phone], at any time of the day or night, and leave a message stating your name, the date and nature of the emergency, and the name and dose of the medication prescribed. In the event that your call is answered by a member of our staff, make sure to document and save the date, time, and the name of the person that took your information.  2. Exception #2 (Planned Surgery): In the event that you are scheduled by another doctor or dentist to have any type of surgery or procedure, you are allowed (for a period no longer than 30 days), to receive additional pain medication, for the acute post-op pain. However, in this case, you are responsible for picking up a copy of our "Post-op Pain Management for Surgeons" handout, and giving it to your surgeon or dentist. This document is available at our office, and does not require an appointment to obtain it. Simply go to our office during business hours (Monday-Thursday from 8:00 AM to 4:00 PM) (Friday 8:00 AM to 12:00 Noon) or if you have a scheduled appointment with us, prior to your surgery, and ask for it by name. In addition, you will need to provide us with your name, name of your surgeon, type of surgery, and date of procedure or surgery.  *Opioid medications include: morphine, codeine, oxycodone, oxymorphone, hydrocodone, hydromorphone, meperidine, tramadol, tapentadol, buprenorphine, fentanyl, methadone. **Benzodiazepine medications include: diazepam (Valium), alprazolam (Xanax), clonazepam (Klonopine), lorazepam (Ativan), clorazepate (Tranxene), chlordiazepoxide (Librium), estazolam (Prosom),  oxazepam (Serax), temazepam (Restoril), triazolam (Halcion) (Last updated: 11/27/2017) ____________________________________________________________________________________________   ____________________________________________________________________________________________  Medication Recommendations and Reminders  Applies to: All patients receiving prescriptions (written and/or electronic).  Medication Rules & Regulations: These rules and regulations exist for your safety and that of others. They are not flexible and neither are we. Dismissing or ignoring them will be considered "non-compliance" with medication therapy, resulting in complete and irreversible termination of such therapy. (See document titled "Medication Rules" for more details.) In all conscience, because of safety reasons, we cannot continue providing a therapy where the patient does not follow instructions.  Pharmacy of record:   Definition: This is the pharmacy where your electronic prescriptions will be sent.   We do not endorse any particular pharmacy, however, we have experienced problems with [facility] not securing enough medication supply for the community.  We do not restrict you in your choice of pharmacy. However, once we write for your prescriptions, we will NOT be re-sending more prescriptions to fix restricted supply problems created by your pharmacy, or your insurance.   The pharmacy listed in the electronic medical record should be the one where you want electronic prescriptions to be sent.  If you choose to change pharmacy, simply notify our nursing staff.  Recommendations:  Keep all of your pain medications in a safe place, under lock and key, even if you live alone. We will NOT replace lost, stolen, or damaged medication.  After you fill your prescription, take 1 week's worth of pills and put them away in a safe place. You should keep a separate, properly labeled bottle for this purpose. The remainder    should be kept in the original bottle. Use this as your primary supply, until it runs out. Once it's gone, then you know that you have 1 week's worth of medicine, and it is time to come in for a prescription refill. If you do this correctly, it is unlikely that you will ever run out of medicine.  To make sure that the above recommendation works, it is very important that you make sure your medication refill appointments are scheduled at least 1 week before you run out of medicine. To do this in an effective manner, make sure that you do not leave the office without scheduling your next medication management appointment. Always ask the nursing staff to show you in your prescription , when your medication will be running out. Then arrange for the receptionist to get you a return appointment, at least 7 days before you run out of medicine. Do not wait until you have 1 or 2 pills left, to come in. This is very poor planning and does not take into consideration that we may need to cancel appointments due to bad weather, sickness, or emergencies affecting our staff.  DO NOT ACCEPT A "Partial Fill": If for any reason your pharmacy does not have enough pills/tablets to completely fill or refill your prescription, do not allow for a "partial fill". The law allows the pharmacy to complete that prescription within 72 hours, without requiring a new prescription. If they do not fill the rest of your prescription within those 72 hours, you will need a separate prescription to fill the remaining amount, which we will NOT provide. If the reason for the partial fill is your insurance, you will need to talk to the pharmacist about payment alternatives for the remaining tablets, but again, DO NOT ACCEPT A PARTIAL FILL, unless you can trust your pharmacist to obtain the remainder of the pills within 72 hours.  Prescription refills and/or changes in medication(s):   Prescription refills, and/or changes in dose or medication,  will be conducted only during scheduled medication management appointments. (Applies to both, written and electronic prescriptions.)  No refills on procedure days. No medication will be changed or started on procedure days. No changes, adjustments, and/or refills will be conducted on a procedure day. Doing so will interfere with the diagnostic portion of the procedure.  No phone refills. No medications will be "called into the pharmacy".  No Fax refills.  No weekend refills.  No Holliday refills.  No after hours refills.  Remember:  Business hours are:  Monday to Thursday 8:00 AM to 4:00 PM Provider's Schedule: Lyfe Monger, MD - Appointments are:  Medication management: Monday and Wednesday 8:00 AM to 4:00 PM Procedure day: Tuesday and Thursday 7:30 AM to 4:00 PM Bilal Lateef, MD - Appointments are:  Medication management: Tuesday and Thursday 8:00 AM to 4:00 PM Procedure day: Monday and Wednesday 7:30 AM to 4:00 PM (Last update: 04/19/2020) ____________________________________________________________________________________________   ____________________________________________________________________________________________  CANNABIDIOL (AKA: CBD Oil or Pills)  Applies to: All patients receiving prescriptions of controlled substances (written and/or electronic).  General Information: Cannabidiol (CBD), a derivative of Marijuana, was discovered in 1940. It is one of some 113 identified cannabinoids in cannabis (Marijuana) plants, accounting for up to 40% of the plant's extract. As of 2018, preliminary clinical research on cannabidiol included studies of anxiety, cognition, movement disorders, and pain.  Cannabidiol is consummed in multiple ways, including inhalation of cannabis smoke or vapor, as an aerosol spray into the cheek, and by mouth. It   may be supplied as CBD oil containing CBD as the active ingredient (no added tetrahydrocannabinol (THC) or terpenes), a full-plant  CBD-dominant hemp extract oil, capsules, dried cannabis, or as a liquid solution. CBD is thought not have the same psychoactivity as THC, and may affect the actions of THC. Studies suggest that CBD may interact with different biological targets, including cannabinoid receptors and other neurotransmitter receptors. As of 2018 the mechanism of action for its biological effects has not been determined.  In the [place], cannabidiol has a limited approval by the Food and Drug Administration (FDA) for treatment of only two types of epilepsy disorders. The side effects of long-term use of the drug include somnolence, decreased appetite, diarrhea, fatigue, malaise, weakness, sleeping problems, and others.  CBD remains a Schedule I drug prohibited for any use.  Legality: Some manufacturers ship CBD products nationally, an illegal action which the FDA has not enforced in 2018, with CBD remaining the subject of an FDA investigational new drug evaluation, and is not considered legal as a dietary supplement or food ingredient as of December 2018. Federal illegality has made it difficult historically to conduct research on CBD. CBD is openly sold in head shops and health food stores in some states where such sales have not been explicitly legalized.  Warning: Because it is not FDA approved for general use or treatment of pain, it is not required to undergo the same manufacturing controls as prescription drugs.  This means that the available cannabidiol (CBD) may be contaminated with THC.  If this is the case, it will trigger a positive urine drug screen (UDS) test for cannabinoids (Marijuana).  Because a positive UDS for illicit substances is a violation of our medication agreement, your opioid analgesics (pain medicine) may be permanently discontinued. (Last update:  04/19/2020) ____________________________________________________________________________________________   ____________________________________________________________________________________________  Preparing for Procedure with Sedation  Procedure appointments are limited to planned procedures: . No Prescription Refills. . No disability issues will be discussed. . No medication changes will be discussed.  Instructions: . Oral Intake: Do not eat or drink anything for at least 8 hours prior to your procedure. (Exception: Blood Pressure Medication. See below.) . Transportation: Unless otherwise stated by your physician, you may drive yourself after the procedure. . Blood Pressure Medicine: Do not forget to take your blood pressure medicine with a sip of water the morning of the procedure. If your Diastolic (lower reading)is above 100 mmHg, elective cases will be cancelled/rescheduled. . Blood thinners: These will need to be stopped for procedures. Notify our staff if you are taking any blood thinners. Depending on which one you take, there will be specific instructions on how and when to stop it. . Diabetics on insulin: Notify the staff so that you can be scheduled 1st case in the morning. If your diabetes requires high dose insulin, take only  of your normal insulin dose the morning of the procedure and notify the staff that you have done so. . Preventing infections: Shower with an antibacterial soap the morning of your procedure. . Build-up your immune system: Take 1000 mg of Vitamin C with every meal (3 times a day) the day prior to your procedure. Marland Kitchen Antibiotics: Inform the staff if you have a condition or reason that requires you to take antibiotics before dental procedures. . Pregnancy: If you are pregnant, call and cancel the procedure. . Sickness: If you have a cold, fever, or any active infections, call and cancel the procedure. . Arrival: You must be in  the facility at least 30  minutes prior to your scheduled procedure. . Children: Do not bring children with you. . Dress appropriately: Bring dark clothing that you would not mind if they get stained. . Valuables: Do not bring any jewelry or valuables.  Reasons to call and reschedule or cancel your procedure: (Following these recommendations will minimize the risk of a serious complication.) . Surgeries: Avoid having procedures within 2 weeks of any surgery. (Avoid for 2 weeks before or after any surgery). . Flu Shots: Avoid having procedures within 2 weeks of a flu shots or . (Avoid for 2 weeks before or after immunizations). . Barium: Avoid having a procedure within 7-10 days after having had a radiological study involving the use of radiological contrast. (Myelograms, Barium swallow or enema study). . Heart attacks: Avoid any elective procedures or surgeries for the initial 6 months after a "Myocardial Infarction" (Heart Attack). . Blood thinners: It is imperative that you stop these medications before procedures. Let us know if you if you take any blood thinner.  . Infection: Avoid procedures during or within two weeks of an infection (including chest colds or gastrointestinal problems). Symptoms associated with infections include: Localized redness, fever, chills, night sweats or profuse sweating, burning sensation when voiding, cough, congestion, stuffiness, runny nose, sore throat, diarrhea, nausea, vomiting, cold or Flu symptoms, recent or current infections. It is specially important if the infection is over the area that we intend to treat. Marland Kitchen Heart and lung problems: Symptoms that may suggest an active cardiopulmonary problem include: cough, chest pain, breathing difficulties or shortness of breath, dizziness, ankle swelling, uncontrolled high or unusually low blood pressure, and/or palpitations. If you are experiencing any of these symptoms, cancel your procedure and contact your primary care physician for an  evaluation.  Remember:  Regular Business hours are:  Monday to Thursday 8:00 AM to 4:00 PM  Provider's Schedule: Milinda Pointer, MD:  Procedure days: Tuesday and Thursday 7:30 AM to 4:00 PM  Gillis Santa, MD:  Procedure days: Monday and Wednesday 7:30 AM to 4:00 PM ____________________________________________________________________________________________   Preparing for Procedure with Sedation Instructions: . Oral Intake: Do not eat or drink anything for at least 8 hours prior to your procedure. . Transportation: Public transportation is not allowed. Bring an adult driver. The driver must be physically present in our waiting room before any procedure can be started. Marland Kitchen Physical Assistance: Bring an adult capable of physically assisting you, in the event you need help. . Blood Pressure Medicine: Take your blood pressure medicine with a sip of water the morning of the procedure. . Insulin: Take only  of your normal insulin dose. . Preventing infections: Shower with an antibacterial soap the morning of your procedure. . Build-up your immune system: Take 1000 mg of Vitamin C with every meal (3 times a day) the day prior to your procedure. . Pregnancy: If you are pregnant, call and cancel the procedure. . Sickness: If you have a cold, fever, or any active infections, call and cancel the procedure. . Arrival: You must be in the facility at least 30 minutes prior to your scheduled procedure. . Children: Do not bring children with you. . Dress appropriately: Bring dark clothing that you would not mind if they get stained. . Valuables: Do not bring any jewelry or valuables. Procedure appointments are reserved for interventional treatments only. Marland Kitchen No Prescription Refills. . No medication changes will be discussed during procedure appointments. . No disability issues will be discussed. Selective Nerve Root  Block Patient Information  Description: Specific nerve roots exit the spinal  canal and these nerves can be compressed and inflamed by a bulging disc and bone spurs.  By injecting steroids on the nerve root, we can potentially decrease the inflammation surrounding these nerves, which often leads to decreased pain.  Also, by injecting local anesthesia on the nerve root, this can provide [place] helpful information to give to your referring doctor if it decreases your pain.  Selective nerve root blocks can be done along the spine from the neck to the low back depending on the location of your pain.   After numbing the skin with local anesthesia, a small needle is passed to the nerve root and the position of the needle is verified using x-ray pictures.  After the needle is in correct position, we then deposit the medication.  You may experience a pressure sensation while this is being done.  The entire block usually lasts less than 15 minutes.  Conditions that may be treated with selective nerve root blocks: Low back and leg pain Spinal stenosis Diagnostic block prior to potential surgery Neck and arm pain Post laminectomy syndrome  Preparation for the injection:  Do not eat any solid food or dairy products within 8 hours of your appointment. You may drink clear liquids up to 3 hours before an appointment.  Clear liquids include water, black coffee, juice or soda.  No milk or cream please. You may take your regular medications, including pain medications, with a sip of water before your appointment.  Diabetics should hold regular insulin (if taken separately) and take 1/2 normal NPH dose the morning of the procedure.  Carry some sugar containing items with you to your appointment. A driver must accompany you and be prepared to drive you home after your procedure. Bring all your current medications with you. An IV may be inserted and sedation may be given at the discretion of the physician. A blood pressure cuff, EKG, and other monitors will often be applied during the procedure.   Some patients may need to have extra oxygen administered for a short period. You will be asked to provide medical information, including allergies, prior to the procedure.  We must know immediately if you are taking blood  Thinners (like Coumadin) or if you are allergic to IV iodine contrast (dye).  Possible side-effects: All are usually temporary Bleeding from needle site Light headedness Numbness and tingling Decreased blood pressure Weakness in arms/legs Pressure sensation in back/neck Pain at injection site (several days)  Possible complications: All are extremely rare Infection Nerve injury Spinal headache (a headache wore with upright position)  Call if you experience: Fever/chills associated with headache or increased back/neck pain Headache worsened by an upright position New onset weakness or numbness of an extremity below the injection site Hives or difficulty breathing (go to the emergency room) Inflammation or drainage at the injection site(s) Severe back/neck pain greater than usual New symptoms which are concerning to you  Please note:  Although the local anesthetic injected can often make your back or neck feel good for several hours after the injection the pain will likely return.  It takes 3-5 days for steroids to work on the nerve root. You may not notice any pain relief for at least one week.  If effective, we will often do a series of 3 injections spaced 3-6 weeks apart to maximally decrease your pain.    If you have any questions, please call [phone] [facility] 

## 2020-05-08 ENCOUNTER — Encounter: Payer: Self-pay | Admitting: Pain Medicine

## 2020-05-08 ENCOUNTER — Ambulatory Visit: Payer: Self-pay | Attending: Pain Medicine | Admitting: Pain Medicine

## 2020-05-08 ENCOUNTER — Other Ambulatory Visit: Payer: Self-pay

## 2020-05-08 VITALS — BP 131/81 | HR 80 | Temp 98.3°F | Resp 16 | Ht 63.0 in | Wt 220.0 lb

## 2020-05-08 DIAGNOSIS — M5441 Lumbago with sciatica, right side: Secondary | ICD-10-CM | POA: Insufficient documentation

## 2020-05-08 DIAGNOSIS — G894 Chronic pain syndrome: Secondary | ICD-10-CM | POA: Insufficient documentation

## 2020-05-08 DIAGNOSIS — M5126 Other intervertebral disc displacement, lumbar region: Secondary | ICD-10-CM | POA: Insufficient documentation

## 2020-05-08 DIAGNOSIS — M5481 Occipital neuralgia: Secondary | ICD-10-CM | POA: Insufficient documentation

## 2020-05-08 DIAGNOSIS — Z79899 Other long term (current) drug therapy: Secondary | ICD-10-CM | POA: Insufficient documentation

## 2020-05-08 DIAGNOSIS — G8929 Other chronic pain: Secondary | ICD-10-CM | POA: Insufficient documentation

## 2020-05-08 DIAGNOSIS — M5136 Other intervertebral disc degeneration, lumbar region: Secondary | ICD-10-CM | POA: Insufficient documentation

## 2020-05-08 DIAGNOSIS — M549 Dorsalgia, unspecified: Secondary | ICD-10-CM | POA: Insufficient documentation

## 2020-05-08 DIAGNOSIS — M5442 Lumbago with sciatica, left side: Secondary | ICD-10-CM | POA: Insufficient documentation

## 2020-05-24 ENCOUNTER — Encounter: Payer: Self-pay | Admitting: Physical Medicine and Rehabilitation

## 2020-05-24 ENCOUNTER — Encounter: Payer: Self-pay | Attending: Physical Medicine and Rehabilitation | Admitting: Physical Medicine and Rehabilitation

## 2020-05-24 ENCOUNTER — Other Ambulatory Visit: Payer: Self-pay

## 2020-05-24 VITALS — BP 131/81 | HR 80 | Temp 99.0°F | Ht 63.0 in | Wt 223.6 lb

## 2020-05-24 DIAGNOSIS — Z6838 Body mass index (BMI) 38.0-38.9, adult: Secondary | ICD-10-CM | POA: Insufficient documentation

## 2020-05-24 DIAGNOSIS — G43119 Migraine with aura, intractable, without status migrainosus: Secondary | ICD-10-CM | POA: Insufficient documentation

## 2020-05-24 DIAGNOSIS — G43001 Migraine without aura, not intractable, with status migrainosus: Secondary | ICD-10-CM | POA: Insufficient documentation

## 2020-05-24 DIAGNOSIS — G9332 Myalgic encephalomyelitis/chronic fatigue syndrome: Secondary | ICD-10-CM

## 2020-05-24 DIAGNOSIS — M797 Fibromyalgia: Secondary | ICD-10-CM | POA: Insufficient documentation

## 2020-05-24 DIAGNOSIS — R5382 Chronic fatigue, unspecified: Secondary | ICD-10-CM

## 2020-05-24 DIAGNOSIS — M7918 Myalgia, other site: Secondary | ICD-10-CM | POA: Insufficient documentation

## 2020-05-24 NOTE — Progress Notes (Signed)
Subjective:    Patient ID: Dana Bradley, female    DOB: Jul 17, 1971, 49 y.o.   MRN: [id number]  HPI   Due to national recommendations of social distancing because of COVID 56, an audio/video tele-health visit is felt to be the most appropriate encounter for this patient at this time as she has a fever. See MyChart message from today for the patient's consent to a tele-health encounter with [facility]. This is a follow up tele-visit via phone. The patient is at home. MD is at office.   Dana Bradley is a 49 year old woman who presents for follow-up of her chronic migraine. Her average pain is 6/10 and pain right now is 6/10. Her pain is intermittent, sharp, burning, stabbing, tingling, and aching.   She recently had 7 days without migraine. She is not sure what was associated with this. She had a pretty terrible bounceback.  She picked up the Migranal nasal spray. Our CMA explained how it works.   Her pain doctor is going to stop prescribing the Lyrica and she needs to find a new doctor to prescribe this.    Pain Inventory Average Pain 6 Pain Right Now 6 My pain is intermittent, sharp, burning, stabbing, tingling and aching  In the last 24 hours, has pain interfered with the following? General activity 6 Relation with others 8 Enjoyment of life 8 What TIME of day is your pain at its worst? daytime, evening and night Sleep (in general) Poor  Pain is worse with: bending and some activites Pain improves with: rest, heat/ice, medication and TENS  Family History  Problem Relation Age of Onset  . Depression Mother   . Hypertension Mother   . Cancer Mother        Skin  . Hyperlipidemia Mother   . Anxiety disorder Mother   . Migraines Mother   . Alcohol abuse Father   . Depression Father   . Stroke Father   . Heart disease Father   . Hypertension Father   . Anxiety disorder Father   . Depression Sister   . Hyperlipidemia Sister   . Diabetes  Sister   . Hypertension Sister   . Polycystic ovary syndrome Sister   . Bipolar disorder Sister   . Anxiety disorder Sister   . Migraines Sister   . Cancer Maternal Grandmother 60       Breast  . Thyroid disease Maternal Grandmother   . Arthritis Maternal Grandmother   . Hyperlipidemia Maternal Grandmother   . Depression Sister   . Hypertension Sister   . Anxiety disorder Sister   . Migraines Sister   . Alzheimer's disease Other   . Aneurysm Maternal Grandfather   . Hypertension Maternal Grandfather   . Heart disease Maternal Grandfather   . Alzheimer's disease Paternal Grandmother   . Heart attack Paternal Grandfather   . Hypertension Paternal Grandfather   . COPD Paternal Grandfather   . Heart disease Paternal Grandfather   . Migraines Son   . Migraines Daughter   . Migraines Daughter   . Bladder Cancer Neg Hx   . Kidney cancer Neg Hx    Social History   Socioeconomic History  . Marital status: Married    Spouse name: brian  . Number of children: 3  . Years of education: Not on file  . Highest education level: Associate degree: academic program  Occupational History  . Occupation: disbled    Comment: not able  Tobacco Use  .  Smoking status: Former Smoker    Packs/day: 4.00    Years: 3.00    Pack years: 12.00    Types: Cigarettes    Quit date: 12/10/1992    Years since quitting: 27.4  . Smokeless tobacco: Never Used  . Tobacco comment: quit 25 years ago  Vaping Use  . Vaping Use: Never used  Substance and Sexual Activity  . Alcohol use: No    Alcohol/week: 0.0 standard drinks  . Drug use: No  . Sexual activity: Not Currently    Partners: Male    Birth control/protection: Surgical  Other Topics Concern  . Not on file  Social History Narrative   Lives at home with her husband and 2 of her children   Right handed   Caffeine: 0-2 cups daily   Social Determinants of Health   Financial Resource Strain:   . Difficulty of Paying Living Expenses: Not on  file  Food Insecurity:   . Worried About Charity fundraiser in the Last Year: Not on file  . Ran Out of Food in the Last Year: Not on file  Transportation Needs:   . Lack of Transportation (Medical): Not on file  . Lack of Transportation (Non-Medical): Not on file  Physical Activity:   . Days of Exercise per Week: Not on file  . Minutes of Exercise per Session: Not on file  Stress:   . Feeling of Stress : Not on file  Social Connections:   . Frequency of Communication with Friends and Family: Not on file  . Frequency of Social Gatherings with Friends and Family: Not on file  . Attends Religious Services: Not on file  . Active Member of Clubs or Organizations: Not on file  . Attends Archivist Meetings: Not on file  . Marital Status: Not on file   Past Surgical History:  Procedure Laterality Date  . ABLATION     Uterine  . CARDIAC CATHETERIZATION     with ablation  . COLONOSCOPY WITH PROPOFOL N/A 05/17/2015   Procedure: COLONOSCOPY WITH PROPOFOL;  Surgeon: Manya Silvas, MD;  Location: [facility] ENDOSCOPY;  Service: Endoscopy;  Laterality: N/A;  . COLONOSCOPY WITH PROPOFOL N/A 03/20/2020   Procedure: COLONOSCOPY WITH PROPOFOL;  Surgeon: Jonathon Bellows, MD;  Location: [facility] ENDOSCOPY;  Service: Gastroenterology;  Laterality: N/A;  . ESOPHAGOGASTRODUODENOSCOPY N/A 05/17/2015   Procedure: ESOPHAGOGASTRODUODENOSCOPY (EGD);  Surgeon: Manya Silvas, MD;  Location[facility] ENDOSCOPY;  Service: Endoscopy;  Laterality: N/A;  . KNEE ARTHROSCOPY    . spg     6/18  . Tibial Tubercle Bypass Right 1998  . TUBAL LIGATION  10/01/99   Past Surgical History:  Procedure Laterality Date  . ABLATION     Uterine  . CARDIAC CATHETERIZATION     with ablation  . COLONOSCOPY WITH PROPOFOL N/A 05/17/2015   Procedure: COLONOSCOPY WITH PROPOFOL;  Surgeon: Manya Silvas, MD;  Location: [facility] ENDOSCOPY;  Service: Endoscopy;  Laterality: N/A;  . COLONOSCOPY WITH PROPOFOL N/A 03/20/2020   Procedure:  COLONOSCOPY WITH PROPOFOL;  Surgeon: Jonathon Bellows, MD;  Location: [facility] ENDOSCOPY;  Service: Gastroenterology;  Laterality: N/A;  . ESOPHAGOGASTRODUODENOSCOPY N/A 05/17/2015   Procedure: ESOPHAGOGASTRODUODENOSCOPY (EGD);  Surgeon: Manya Silvas, MD;  Location: [facility] ENDOSCOPY;  Service: Endoscopy;  Laterality: N/A;  . KNEE ARTHROSCOPY    . spg     6/18  . Tibial Tubercle Bypass Right 1998  . TUBAL LIGATION  10/01/99   Past Medical History:  Diagnosis Date  . Acute  postoperative pain 04/07/2017  . Anxiety   . Bursitis   . Chronic fatigue 12/12/2017  . Chronic fatigue syndrome   . Edema leg 05/02/2015  . Fibromyalgia   . GERD (gastroesophageal reflux disease)   . IBS (irritable bowel syndrome)   . Knee pain, bilateral 12/21/2008   Qualifier: Diagnosis of  By: Hassell Done FNP, Tori Milks    . Lumbar discitis   . Migraines   . Osteoarthritis   . Right hand pain 04/10/2015   [facility] has done nerve conduction studies and ruled out carpal tunnel.   . Sleep apnea   . Spinal stenosis   . SVT (supraventricular tachycardia) ([place])   . Vertigo   . Vitamin D deficiency 05/01/2016   Temp 99 F (37.2 C)   Ht 5\' 3"  (1.6 m)   Wt 223 lb 9.6 oz (101.4 kg)   BMI 39.61 kg/m   Opioid Risk Score:   Fall Risk Score:  `1  Depression screen PHQ 2/9  Depression screen [facility] 2/9 04/12/2020 03/13/2020 03/01/2020 01/24/2020 09/28/2019 09/21/2019 07/30/2019  Decreased Interest 0 1 0 0 0 1 1  Down, Depressed, Hopeless 0 0 0 0 0 1 0  PHQ - 2 Score 0 1 0 0 0 2 1  Altered sleeping - - - 0 - 1 2  Tired, decreased energy - - - 0 - 1 3  Change in appetite - - - 0 - 1 1  Feeling bad or failure about yourself  - - - 0 - 1 1  Trouble concentrating - - - 0 - 1 1  Moving slowly or fidgety/restless - - - 0 - 1 1  Suicidal thoughts - - - 0 - 0 0  PHQ-9 Score - - - 0 - 8 10  Difficult doing work/chores - - - Not difficult at all - - Somewhat difficult  Some recent data might be hidden   Review of Systems      Objective:   Physical Exam  Not performed as patient was seen by phone.       Assessment & Plan:  Dana Bradley is a 49 year old woman with chronic intractable migraines s/p numerous treatments, severe fibromyalgia, and IBS, and nausea.   ContinuePhysical Therapy: Continue for cervical myofascial pain syndrome: myofascial release, postural correction, stretching and strengthening of the muscles of the neck and upper back, development of HEP. Heating pads to muscles of upper back and neck. She is doing HEP.   Exercise: Discussed that exercise is one of the most effective treatments for fibromyalgia. This will also help with her obesity. Made goal with Dana Bradley to walk outside her home at least once per day, and to garden at least once per day (her favorite activity). Can use elliptical which she has at home on rainy days. The heat has been oppressive and so she has been trying to do the latter more.   Quality of Life: Discussed that Dana Bradley's greatest source of happiness is her family. This community will be essential in helping her recover from her chronic pain and to increase her daily activity.Her daughter is also suffering from similar migraines unfortunately.  -Continue ginger, herbs, turmeric, blueberries, eating real food. Continue cutting down sugar. Using local honey is a great alternative.  Medications: Continue Baclofen for pain relief. Minimize use of Hydrocodone. She is only taking Lyrica once per day to make it last. Start Migranal which she finally received on Saturday. We educated regarding its use today.   Migraines:  Provided refill of Fioricet, which is one of the medications that helps her. She takes this at least once every time she gets a migraine. Advised to use upon migraine onset and to not use more frequently than q6H during migraine. Refilled Zofran for nausea last week, and this has been helping her. Continue metoprolol which can be helpful in migraine  prophylaxis (HR is well controlled). Prescribed ergot nasal spray to try upon migraine initiation- advised no more frequently than 4 sprays per hour (still awaiting on prior auth). Discussed avoiding foods that may trigger migraines.  -Discussed that I can provide refill for her Lyrica when she needs it.  -When she gets the migraines her loss of words is getting worse.   Cervical facet arthrosis: Cervical XR normal- discussed results with patient. Pain is worse on left side.    All questions were encouraged and answered. Follow up with mein 4 weeks.  15 minutes spent in dietary education, discussion of migraines symptoms, discussion of Migranal use, discussion of XR results, discussion of Lyrica benefits.

## 2020-06-01 ENCOUNTER — Ambulatory Visit: Payer: Medicaid Other | Admitting: Pain Medicine

## 2020-06-01 ENCOUNTER — Ambulatory Visit: Payer: Self-pay

## 2020-06-06 ENCOUNTER — Other Ambulatory Visit: Payer: Self-pay | Admitting: Gastroenterology

## 2020-06-06 ENCOUNTER — Ambulatory Visit: Payer: Medicaid Other | Admitting: Pain Medicine

## 2020-06-07 ENCOUNTER — Other Ambulatory Visit: Payer: Self-pay

## 2020-06-07 ENCOUNTER — Ambulatory Visit: Payer: Self-pay | Attending: Pain Medicine

## 2020-06-07 DIAGNOSIS — M5440 Lumbago with sciatica, unspecified side: Secondary | ICD-10-CM | POA: Insufficient documentation

## 2020-06-07 DIAGNOSIS — Z9181 History of falling: Secondary | ICD-10-CM | POA: Insufficient documentation

## 2020-06-07 DIAGNOSIS — M5442 Lumbago with sciatica, left side: Secondary | ICD-10-CM | POA: Insufficient documentation

## 2020-06-07 DIAGNOSIS — M542 Cervicalgia: Secondary | ICD-10-CM | POA: Insufficient documentation

## 2020-06-07 DIAGNOSIS — R269 Unspecified abnormalities of gait and mobility: Secondary | ICD-10-CM | POA: Insufficient documentation

## 2020-06-07 DIAGNOSIS — M5441 Lumbago with sciatica, right side: Secondary | ICD-10-CM | POA: Insufficient documentation

## 2020-06-07 DIAGNOSIS — M6281 Muscle weakness (generalized): Secondary | ICD-10-CM | POA: Insufficient documentation

## 2020-06-07 DIAGNOSIS — R252 Cramp and spasm: Secondary | ICD-10-CM | POA: Insufficient documentation

## 2020-06-07 DIAGNOSIS — G8929 Other chronic pain: Secondary | ICD-10-CM | POA: Insufficient documentation
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
## 2020-06-13 ENCOUNTER — Ambulatory Visit: Payer: Self-pay

## 2020-06-13 ENCOUNTER — Other Ambulatory Visit: Payer: Self-pay

## 2020-06-13 ENCOUNTER — Ambulatory Visit: Payer: Self-pay | Admitting: Physical Therapy

## 2020-06-13 DIAGNOSIS — G8929 Other chronic pain: Secondary | ICD-10-CM

## 2020-06-13 DIAGNOSIS — R252 Cramp and spasm: Secondary | ICD-10-CM

## 2020-06-13 DIAGNOSIS — M6281 Muscle weakness (generalized): Secondary | ICD-10-CM
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
## 2020-06-19 ENCOUNTER — Ambulatory Visit: Payer: Self-pay

## 2020-06-19 ENCOUNTER — Other Ambulatory Visit: Payer: Self-pay

## 2020-06-19 DIAGNOSIS — M6281 Muscle weakness (generalized): Secondary | ICD-10-CM

## 2020-06-19 DIAGNOSIS — M5440 Lumbago with sciatica, unspecified side: Secondary | ICD-10-CM

## 2020-06-19 DIAGNOSIS — R252 Cramp and spasm: Secondary | ICD-10-CM
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
## 2020-06-21 ENCOUNTER — Ambulatory Visit: Payer: Self-pay

## 2020-06-21 ENCOUNTER — Other Ambulatory Visit: Payer: Self-pay

## 2020-06-21 DIAGNOSIS — M6281 Muscle weakness (generalized): Secondary | ICD-10-CM

## 2020-06-21 DIAGNOSIS — R252 Cramp and spasm: Secondary | ICD-10-CM
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
## 2020-06-27 ENCOUNTER — Ambulatory Visit: Payer: Self-pay

## 2020-06-27 DIAGNOSIS — R252 Cramp and spasm: Secondary | ICD-10-CM

## 2020-06-27 DIAGNOSIS — G8929 Other chronic pain: Secondary | ICD-10-CM

## 2020-06-27 DIAGNOSIS — M6281 Muscle weakness (generalized): Secondary | ICD-10-CM

## 2020-06-27 DIAGNOSIS — M542 Cervicalgia: Secondary | ICD-10-CM
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
## 2020-06-28 ENCOUNTER — Other Ambulatory Visit: Payer: Self-pay

## 2020-06-28 ENCOUNTER — Ambulatory Visit: Payer: Self-pay | Admitting: Physical Therapy

## 2020-06-28 DIAGNOSIS — M6281 Muscle weakness (generalized): Secondary | ICD-10-CM

## 2020-06-28 DIAGNOSIS — G8929 Other chronic pain: Secondary | ICD-10-CM

## 2020-06-28 DIAGNOSIS — R269 Unspecified abnormalities of gait and mobility: Secondary | ICD-10-CM

## 2020-06-28 DIAGNOSIS — Z9181 History of falling: Secondary | ICD-10-CM

## 2020-06-29 ENCOUNTER — Encounter: Payer: Self-pay | Admitting: Physical Therapy
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
## 2020-06-30 NOTE — Progress Notes (Signed)
Name: Dana Bradley   MRN: [id number]    DOB: [birth year]   Date:07/03/2020       Progress Note  Chief Complaint  Patient presents with  . Follow-up     Subjective:   Dana Bradley is a 49 y.o. female, presents to clinic for referral to GYN for chronic lower abd/pelvic pain. She has worked with GI and is managing diarrhea, pancreatic insufficiency, GERD, IBS  GI last OV with Dr. Vicente Males 04/17/2020: Summary of history : Initially referred and seen on 11/10/2019 for abdominal pain and diarrhea. She was previously a patient of [facility] gastroenterology. Known to have internal hemorrhoids per her last colonoscopy in 2016,acid reflux with esophagitis in 2016 per her endoscopy.   She was seen back in December 2020by her family nurse practitioner for abdominal pain. Ongoing since August along with diarrhea.She has a longstanding history of irritable bowel syndrome with diarrhea. She consumes some sweet and low in her drinks daily. She is up to 2 or 3 bowel movements which are very runny sticky and smelly each day. Sometimes appear to be pale-colored. She takes Dexilant for her acid reflux. She says she has generalized abdominal pain on and off crampy in nature associated with watery diarrhea and relieved after bowel movement. Stress test make it worse.  10/18/2019 CT scan of the abdomen and pelvis with contrast demonstrates mild hepatic steatosis but no acute intra-abdominal pathology.  09/21/2019: CBC: Normal: Acute hepatitis panel negative. CMP normal. November 2020 GI PCR negative. 12/07/2019: H. pylori breath test, celiac serology negative. Stool for C. difficile as well as fecal calprotectin negative.   Interval history6/05/2020 2719 2021 03/20/2020 colonoscopy: No polyps seen no colitis noted endoscopically biopsies of the terminal ileum as well as random colon biopsies were completely normal.   Since her last visit she is doing much better.  She is taking Creon.  He managed to  get a 1 year supply of medication.  Still having bloating.  Avoiding excess consumption of diet soda.  Takes the Bentyl for cramping.  Helps but sometimes causes a bit of constipation.  Not taking any magnesium oxide.  On a PPI.   Plan 1.  Trial of activated charcoal. 2.  Stop Bentyl and try IBgard.  Explained rationale is to try and stop prescription medications if possible. 3.  I will start her on Zenpep which is no different from Creon but has a slightly higher dosage of pancreatic lipase.  If she responds better to a higher dosage then will need to change her dose.  2 weeks samples provided. 4.  Continue low FODMAP diet. 5.  Avoid artificial sugars. 6.  If IBgard works she can substituted with peppermint which she grows herself.  There is a shorter it helps and IBS-like symptoms.  Pt today reports that when Dr. Vicente Males was doing colonoscopy the bowels were stuck and he could not move them - and Dr. Vicente Males recommended GYN f/up.  I cannot find this in the chart anywhere with past and recent OV or find mentioned in Procedure/colonoscopy note/OP note   Current Outpatient Medications:  .  atorvastatin (LIPITOR) 40 MG tablet, Take 1 tablet (40 mg total) by mouth at bedtime., Disp: 90 tablet, Rfl: 3 .  baclofen (LIORESAL) 10 MG tablet, Take 1 tablet (10 mg total) by mouth 3 (three) times daily., Disp: 90 tablet, Rfl: 5 .  butalbital-acetaminophen-caffeine (FIORICET) 50-325-40 MG tablet, Take 1-2 tablets by mouth every 6 (six) hours as needed for headache., Disp: 20 tablet, Rfl:  0 .  Cyanocobalamin (VITAMIN B-12) 500 MCG SUBL, Place 1,500 mcg under the tongue daily. , Disp: 150 tablet, Rfl:  .  Dexlansoprazole (DEXILANT) 30 MG capsule, Take 1 capsule (30 mg total) by mouth daily. This replaces omeprazole, Disp: 30 capsule, Rfl: 2 .  diclofenac Sodium (VOLTAREN) 1 % GEL, Apply 2 g topically 4 (four) times daily as needed., Disp: 350 g, Rfl: PRN .  dicyclomine (BENTYL) 10 MG capsule, Take 1 capsule (10  mg total) by mouth 4 (four) times daily -  before meals and at bedtime. As needed., Disp: 360 capsule, Rfl: 1 .  dihydroergotamine (MIGRANAL) 4 MG/ML nasal spray, Place 1 spray into the nose as needed for migraine. Use in one nostril as directed.  No more than 4 sprays in one hour, Disp: 8 mL, Rfl: 12 .  lipase/protease/amylase (CREON) 12000-38000 units CPEP capsule, Take by mouth., Disp: , Rfl:  .  magnesium oxide (MAG-OX) 400 MG tablet, Take 300 mg by mouth daily. , Disp: , Rfl:  .  Melatonin 10 MG TABS, Take 10 mg by mouth at bedtime., Disp: , Rfl:  .  metoprolol tartrate (LOPRESSOR) 50 MG tablet, Take 1 tablet (50 mg total) by mouth 2 (two) times daily., Disp: 180 tablet, Rfl: 3 .  mupirocin ointment (BACTROBAN) 2 %, Apply 1 application topically 2 (two) times daily as needed., Disp: 22 g, Rfl: 0 .  omeprazole (PRILOSEC) 40 MG capsule, TAKE ONE CAPSULE BY MOUTH EVERY DAY, Disp: 90 capsule, Rfl: 0 .  ondansetron (ZOFRAN) 4 MG tablet, Take 1 tablet (4 mg total) by mouth every 8 (eight) hours as needed., Disp: 20 tablet, Rfl: 2 .  pregabalin (LYRICA) 150 MG capsule, Take 1 capsule (150 mg total) by mouth every 8 (eight) hours., Disp: 270 capsule, Rfl: 1 .  VALERIAN PO, Take by mouth as needed. Makes Valerian tea about 3-4 times per week., Disp: , Rfl:  .  Vitamin D, Ergocalciferol, (DRISDOL) 1.25 MG (50000 UNIT) CAPS capsule, Take 1 capsule (50,000 Units total) by mouth 2 (two) times a week. x12 weeks., Disp: 24 capsule, Rfl: 2 .  HYDROcodone-acetaminophen (NORCO) 7.5-325 MG tablet, Take 1 tablet by mouth every 6 (six) hours as needed for severe pain. Must last 90 days, Disp: 120 tablet, Rfl: 0  Patient Active Problem List   Diagnosis Date Noted  . Hyperalgesia 03/07/2020  . Neurogenic urinary incontinence 03/01/2020  . Other intervertebral disc degeneration, lumbar region 03/01/2020  . Spinal stenosis of lumbosacral region 03/01/2020  . Pharmacologic therapy 02/06/2020  . Lumbar radiculitis  (Right) 09/21/2019  . Chronic lower extremity pain (Bilateral) 09/06/2019  . Intractable migraine with aura without status migrainosus 08/10/2019  . Other specified dorsopathies, sacral and sacrococcygeal region 08/03/2019  . Latex precautions, history of latex allergy 08/03/2019  . History of allergy to radiographic contrast media 08/03/2019  . DDD (degenerative disc disease), cervical 07/21/2019  . Cervical facet syndrome (Bilateral) (L>R) 07/21/2019  . DDD (degenerative disc disease), thoracic 07/21/2019  . Osteoarthritis of hip (Left) 07/21/2019  . Chronic groin pain (Bilateral) (L>R) 07/21/2019  . Chronic hip pain (Bilateral) (L>R) 07/21/2019  . Somatic dysfunction of sacroiliac joint (Bilateral) 07/21/2019  . Migraine with aura and with status migrainosus, not intractable 04/06/2019  . Cervico-occipital neuralgia of left side 04/06/2019  . Weakness of leg (Left) 04/05/2019  . Difficulty walking 04/05/2019  . Chronic migraine without aura, with intractable migraine, so stated, with status migrainosus 12/27/2018  . Malar rash 09/04/2018  . Trigger point of  neck (Left) 03/19/2018  . Occipital headache 12/25/2017  . Chronic fatigue syndrome with fibromyalgia 12/12/2017  . Trigger point of shoulder region (Left) 11/17/2017  . Myofascial pain syndrome (Left) (trapezius muscle) 07/22/2017  . Lumbar L1-2 disc protrusion (Right) 04/07/2017  . Muscle spasticity 04/01/2017  . Osteoarthritis of shoulder (Bilateral) 04/01/2017  . Lumbar spondylosis 01/06/2017  . Chronic hip pain (Left) 12/24/2016  . Chronic sacroiliac joint pain (Left) 12/24/2016  . Lumbar facet syndrome (Bilateral) (L>R) 12/24/2016  . Lumbar radiculitis (Left) 12/24/2016  . Hypertriglyceridemia 11/27/2016  . History of vasovagal episode 10/30/2016  . Cervicogenic headache 09/09/2016  . Medication monitoring encounter 08/29/2016  . Controlled substance agreement signed 08/28/2016  . Plantar fasciitis of left foot  08/28/2016  . Vitamin B12 deficiency 08/28/2016  . Hyperlipidemia 08/28/2016  . Nephrolithiasis 08/12/2016  . Chronic pain syndrome 08/07/2016  . Long term prescription opiate use 08/07/2016  . Opiate use 08/07/2016  . Long term prescription benzodiazepine use 08/07/2016  . Neurogenic pain 08/07/2016  . Chronic low back pain (Primary Area of Pain) (Bilateral) (R>L) (midline) 08/07/2016  . Chronic upper back pain (Secondary area of Pain) (Bilateral) (L>R) 08/07/2016  . Chronic abdominal pain (Right lower quadrant) 08/07/2016  . Thoracic radiculitis (Bilateral: T10, T11) 08/07/2016  . Chronic occipital neuralgia (Third area of Pain) (Bilateral) (L>R) 08/07/2016  . Chronic neck pain 08/07/2016  . Chronic cervical radicular pain (Bilateral) (L>R) 08/07/2016  . Chronic shoulder blade pain (Bilateral) (L>R) 08/07/2016  . Chronic upper extremity pain (Bilateral) (R>L) 08/07/2016  . Chronic knee pain (Bilateral) (R>L) 08/07/2016  . Chronic ankle pain (Bilateral) 08/07/2016  . Cervical spondylosis with myelopathy and radiculopathy 08/07/2016  . Panic disorder with agoraphobia 05/29/2016  . Depression, unspecified depression type 05/29/2016  . Atypical lymphocytosis 05/01/2016  . Vitamin D insufficiency 05/01/2016  . Chronic lower extremity cramps (Bilateral) (R>L) 04/29/2016  . Obesity 04/29/2016  . GAD (generalized anxiety disorder) 04/29/2016  . Fatigue 04/29/2016  . Insomnia 07/12/2015  . Migraine without aura and with status migrainosus, not intractable 07/12/2015  . Chronic superficial gastritis 06/02/2015  . Chronic pain of multiple joints 05/15/2015  . Bilateral leg edema 05/02/2015  . Paroxysmal supraventricular tachycardia ([place]) 04/17/2015  . Exertional shortness of breath 04/17/2015  . Bright red rectal bleeding 04/06/2015  . DDD (degenerative disc disease), lumbosacral 01/24/2014  . DDD (degenerative disc disease), lumbar 01/24/2014  . Cervico-occipital neuralgia 12/29/2013   . Fibromyalgia 12/29/2013  . Migraine headache 12/29/2013  . Menorrhagia 12/10/2012  . Depression, major, recurrent, in remission ([place]) 01/12/2009  . Chest pain 01/12/2009  . Hypertension, benign essential, goal below 140/90 06/23/2008  . History of PSVT (paroxysmal supraventricular tachycardia) 06/17/2008  . Obstructive sleep apnea 06/17/2008  . GERD 06/13/2008    Past Surgical History:  Procedure Laterality Date  . ABLATION     Uterine  . CARDIAC CATHETERIZATION     with ablation  . COLONOSCOPY WITH PROPOFOL N/A 05/17/2015   Procedure: COLONOSCOPY WITH PROPOFOL;  Surgeon: Manya Silvas, MD;  Location: [facility] ENDOSCOPY;  Service: Endoscopy;  Laterality: N/A;  . COLONOSCOPY WITH PROPOFOL N/A 03/20/2020   Procedure: COLONOSCOPY WITH PROPOFOL;  Surgeon: Jonathon Bellows, MD;  Location: [facility] ENDOSCOPY;  Service: Gastroenterology;  Laterality: N/A;  . ESOPHAGOGASTRODUODENOSCOPY N/A 05/17/2015   Procedure: ESOPHAGOGASTRODUODENOSCOPY (EGD);  Surgeon: Manya Silvas, MD;  Location: [facility] ENDOSCOPY;  Service: Endoscopy;  Laterality: N/A;  . KNEE ARTHROSCOPY    . spg     6/18  . Tibial Tubercle Bypass Right 1998  .  TUBAL LIGATION  10/01/99    Family History  Problem Relation Age of Onset  . Depression Mother   . Hypertension Mother   . Cancer Mother        Skin  . Hyperlipidemia Mother   . Anxiety disorder Mother   . Migraines Mother   . Alcohol abuse Father   . Depression Father   . Stroke Father   . Heart disease Father   . Hypertension Father   . Anxiety disorder Father   . Depression Sister   . Hyperlipidemia Sister   . Diabetes Sister   . Hypertension Sister   . Polycystic ovary syndrome Sister   . Bipolar disorder Sister   . Anxiety disorder Sister   . Migraines Sister   . Cancer Maternal Grandmother 59       Breast  . Thyroid disease Maternal Grandmother   . Arthritis Maternal Grandmother   . Hyperlipidemia Maternal Grandmother   . Depression Sister   .  Hypertension Sister   . Anxiety disorder Sister   . Migraines Sister   . Alzheimer's disease Other   . Aneurysm Maternal Grandfather   . Hypertension Maternal Grandfather   . Heart disease Maternal Grandfather   . Alzheimer's disease Paternal Grandmother   . Heart attack Paternal Grandfather   . Hypertension Paternal Grandfather   . COPD Paternal Grandfather   . Heart disease Paternal Grandfather   . Migraines Son   . Migraines Daughter   . Migraines Daughter   . Bladder Cancer Neg Hx   . Kidney cancer Neg Hx     Social History   Tobacco Use  . Smoking status: Former Smoker    Packs/day: 4.00    Years: 3.00    Pack years: 12.00    Types: Cigarettes    Quit date: 12/10/1992    Years since quitting: 27.5  . Smokeless tobacco: Never Used  . Tobacco comment: quit 25 years ago  Vaping Use  . Vaping Use: Never used  Substance Use Topics  . Alcohol use: No    Alcohol/week: 0.0 standard drinks  . Drug use: No     Allergies  Allergen Reactions  . Aspirin Swelling  . Cymbalta [Duloxetine Hcl] Other (See Comments)    Suicidal ideations and has homicidal thoughts per patient  . Depakote [Divalproex Sodium] Shortness Of Breath    W/ n/v  . Gadolinium Derivatives     Pt was unable to breath  . Haloperidol Shortness Of Breath    W/ n/v  . Meperidine Nausea And Vomiting    Patient projectile vomits and usually result in ER  . Reglan [Metoclopramide] Shortness Of Breath    Can't breath, wheezes  . Tramadol Hcl Palpitations    Severely and adversely affects her SVT giving her tachycardias of 150-160 bpm.  . Trazodone Shortness Of Breath  . Compazine [Prochlorperazine] Other (See Comments)    Panic attack  . Meloxicam Other (See Comments)    mouth sores, tingling, blisters in mouth  . Penicillins Rash  . Tomato Hives    Tongue will blister  . Other   . Shellfish Allergy     Other reaction(s): Unknown  . Shellfish-Derived Products Other (See Comments)  .  Bacitracin-Neomycin-Polymyxin Rash  . Cephalosporins Rash    rash  . Ibuprofen Other (See Comments) and Rash    Blisters in mouth. Blisters in mouth.  . Latex Itching  . Neosporin [Neomycin-Bacitracin Zn-Polymyx] Rash  . Nsaids Other (See Comments)  Blisters in mouth; can take 1 ibuprofen 2x a month  . Sulfa Antibiotics Rash    rash Other reaction(s): Unknown rash   . Sulfonamide Derivatives Rash    Health Maintenance  Topic Date Due  . PAP SMEAR-Modifier  09/22/2019  . TETANUS/TDAP  09/30/2021  . INFLUENZA VACCINE  Completed  . COVID-19 Vaccine  Completed  . Hepatitis C Screening  Completed  . HIV Screening  Completed    Chart Review Today: I personally reviewed active problem list, medication list, allergies, family history, social history, health maintenance, notes from last encounter, lab results, imaging with the patient/caregiver today.   Review of Systems  10 Systems reviewed and are negative for acute change except as noted in the HPI.  Objective:   Vitals:   07/03/20 1107  BP: 118/74  Pulse: 97  Resp: 18  Temp: 98.4 F (36.9 C)  SpO2: 97%  Weight: 226 lb (102.5 kg)  Height: 5\' 4"  (1.626 m)    Body mass index is 38.79 kg/m.  Physical Exam Vitals and nursing note reviewed.  Constitutional:      General: She is not in acute distress.    Appearance: She is obese. She is not ill-appearing, toxic-appearing or diaphoretic.  HENT:     Head: Normocephalic and atraumatic.     Right Ear: External ear normal.     Left Ear: External ear normal.  Eyes:     General: No scleral icterus.       Right eye: No discharge.        Left eye: No discharge.  Cardiovascular:     Rate and Rhythm: Normal rate and regular rhythm.  Pulmonary:     Effort: Pulmonary effort is normal. No respiratory distress.  Abdominal:     General: There is no distension.     Comments: Obese abd  Skin:    General: Skin is warm and dry.     Coloration: Skin is not jaundiced or  pale.  Neurological:     Mental Status: She is alert. Mental status is at baseline.     Gait: Gait normal.  Psychiatric:        Mood and Affect: Mood normal.        Behavior: Behavior normal.         Assessment & Plan:     ICD-10-CM   1. Chronic abdominal pain (Right lower quadrant)  R10.31 Ambulatory referral to Gynecology   G89.29   2. Need for immunization against influenza  Z23 Flu Vaccine QUAD 36+ mos IM   Per pt, GI asked for pt to f/up with GYN regarding chronic lower abd/pelvic pain - would like her evaluated for possible adhesions vs endometriosis?  Cannot find this reflected in last GI OV notes or recent colonoscopy documentation - refer per pt request under charity care plan.      Delsa Grana, PA-C 07/03/20 12:30 PM

## 2020-07-03 ENCOUNTER — Encounter: Payer: Self-pay | Admitting: Family Medicine

## 2020-07-03 ENCOUNTER — Ambulatory Visit (INDEPENDENT_AMBULATORY_CARE_PROVIDER_SITE_OTHER): Payer: Self-pay | Admitting: Family Medicine

## 2020-07-03 ENCOUNTER — Other Ambulatory Visit: Payer: Self-pay

## 2020-07-03 ENCOUNTER — Telehealth: Payer: Medicaid Other | Admitting: Gastroenterology

## 2020-07-03 VITALS — BP 118/74 | HR 97 | Temp 98.4°F | Resp 18 | Ht 64.0 in | Wt 226.0 lb

## 2020-07-03 DIAGNOSIS — R1031 Right lower quadrant pain: Secondary | ICD-10-CM

## 2020-07-03 DIAGNOSIS — Z23 Encounter for immunization: Secondary | ICD-10-CM

## 2020-07-03 DIAGNOSIS — G8929 Other chronic pain: Secondary | ICD-10-CM

## 2020-07-04 ENCOUNTER — Other Ambulatory Visit: Payer: Self-pay | Admitting: Pain Medicine

## 2020-07-04 ENCOUNTER — Telehealth: Payer: Self-pay | Admitting: Obstetrics & Gynecology

## 2020-07-04 NOTE — Telephone Encounter (Signed)
[facility] referring for chronic abdominal pain. Patient is self pay . Called and left voicemail for patient to call back to be scheduled.

## 2020-07-04 NOTE — Progress Notes (Signed)
Functional capacity evaluation (FCE) completed on 06/28/2020.  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  Phone: [phone].  Fax: [phone])] report scanned into system. Minimal overall level of work: Sedentary range. Tolerance: Sedentary level of work for the 8 hours/day; 40 hours/week. Self-limiting behavior: 33%, exceeding normal limits.  Performance on the self-limiting task indicates a minimum rather than a maximum ability.  A maximum on the tasks performed could not be determined due to the self-limiting behavior.  (Self-limiting behavior: Pain; psychosocial issues such as fear of reinjury, anxiety, or depression; and/or attempt to manipulate test results.)

## 2020-07-06 ENCOUNTER — Telehealth: Payer: Self-pay | Admitting: Pharmacist

## 2020-07-06 NOTE — Telephone Encounter (Signed)
07/06/2020 9:21:41 AM - Dexilant renewal to pat & dr  -- Elmer Picker - Thursday, July 06, 2020 9:20 AM --Received notice from Bernita Buffy that patient enrollment ends 08/04/2020, printed renewal and mailing to patient to sign & return, also Interoffice to provider to sign & return.

## 2020-08-01 IMAGING — CR DG CERVICAL SPINE 2 OR 3 VIEWS
3 series · 3 of 3 positions shown · non-contrast
Comparison: 07/09/2019

CLINICAL DATA: Neck pain. Migraine headaches. No known injury.

EXAM:
CERVICAL SPINE - 2-3 VIEW

[c-spine lat]
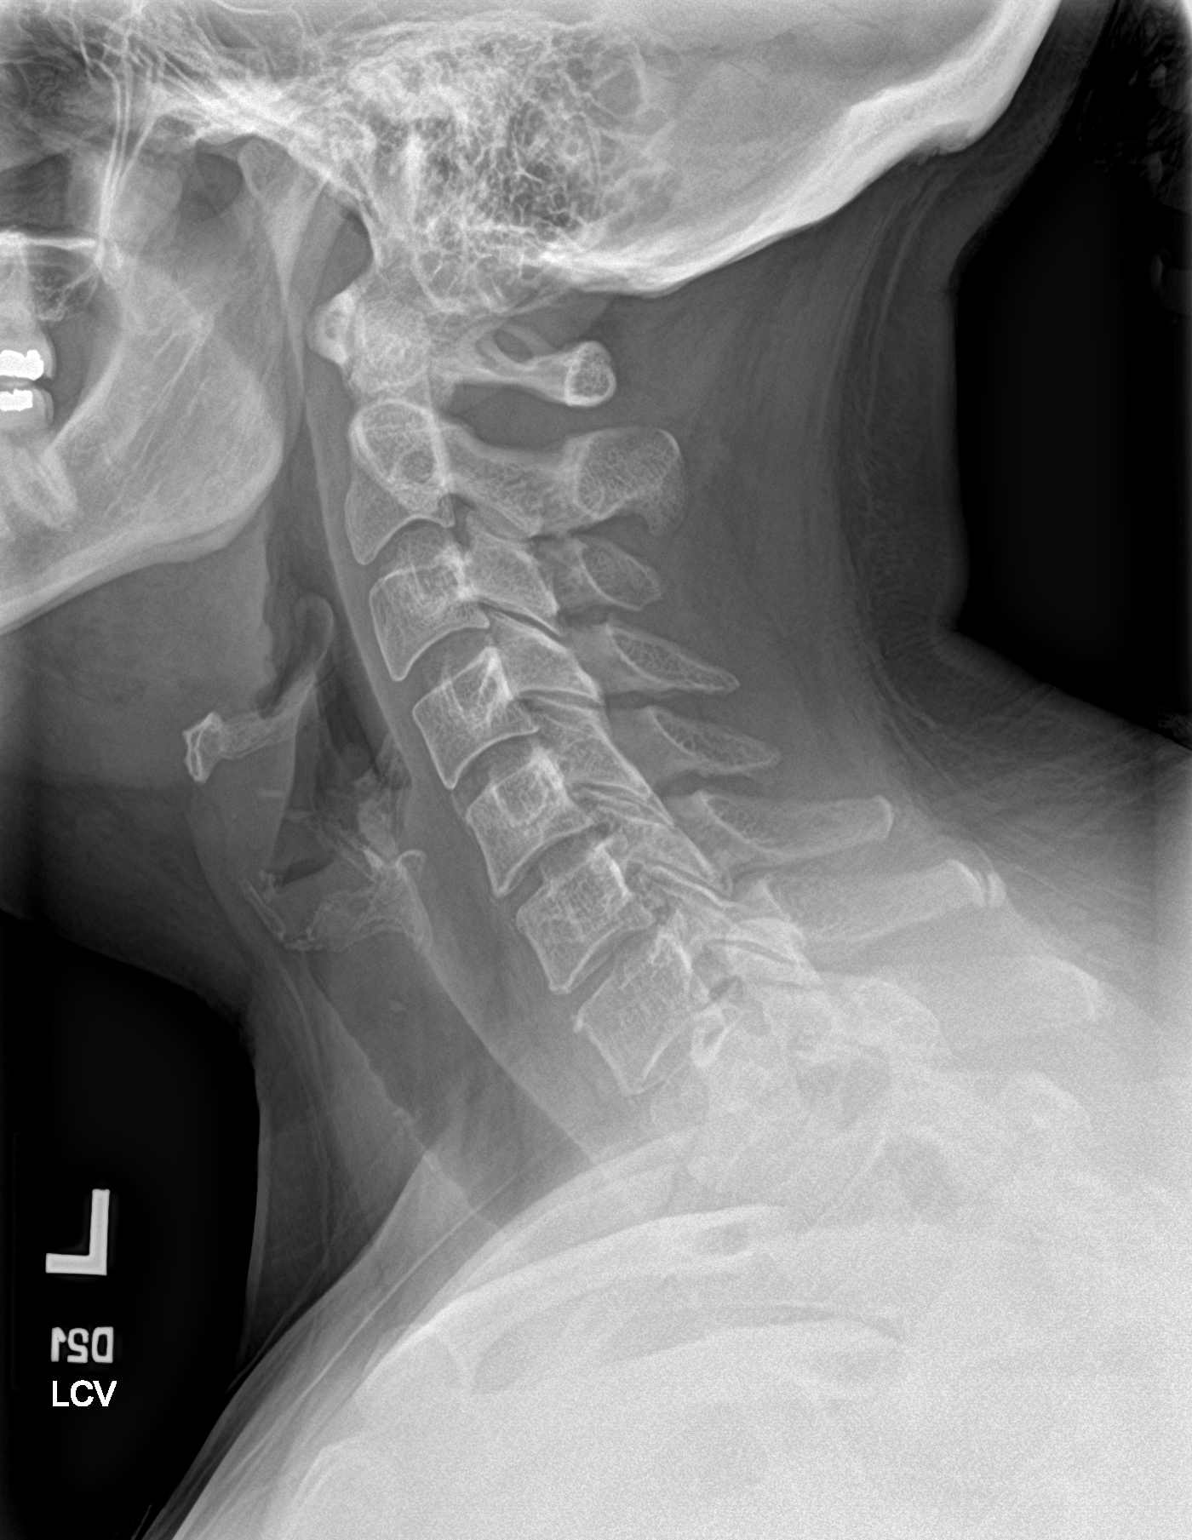

[c-spine ap]
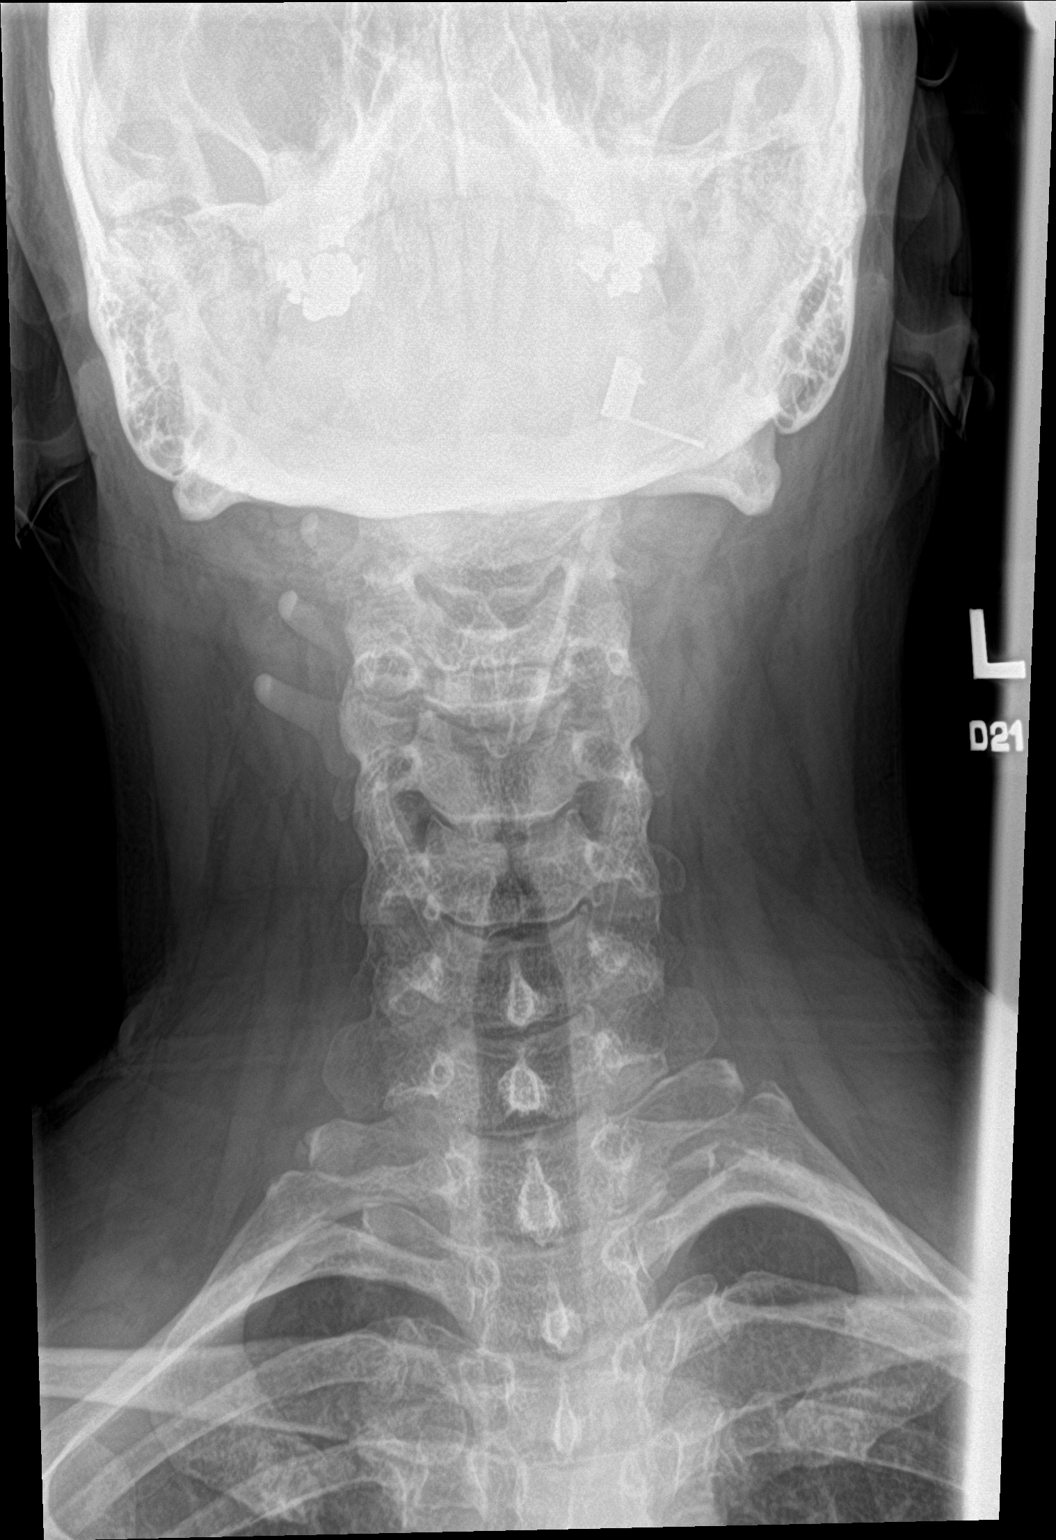

[c-spine open mouth]
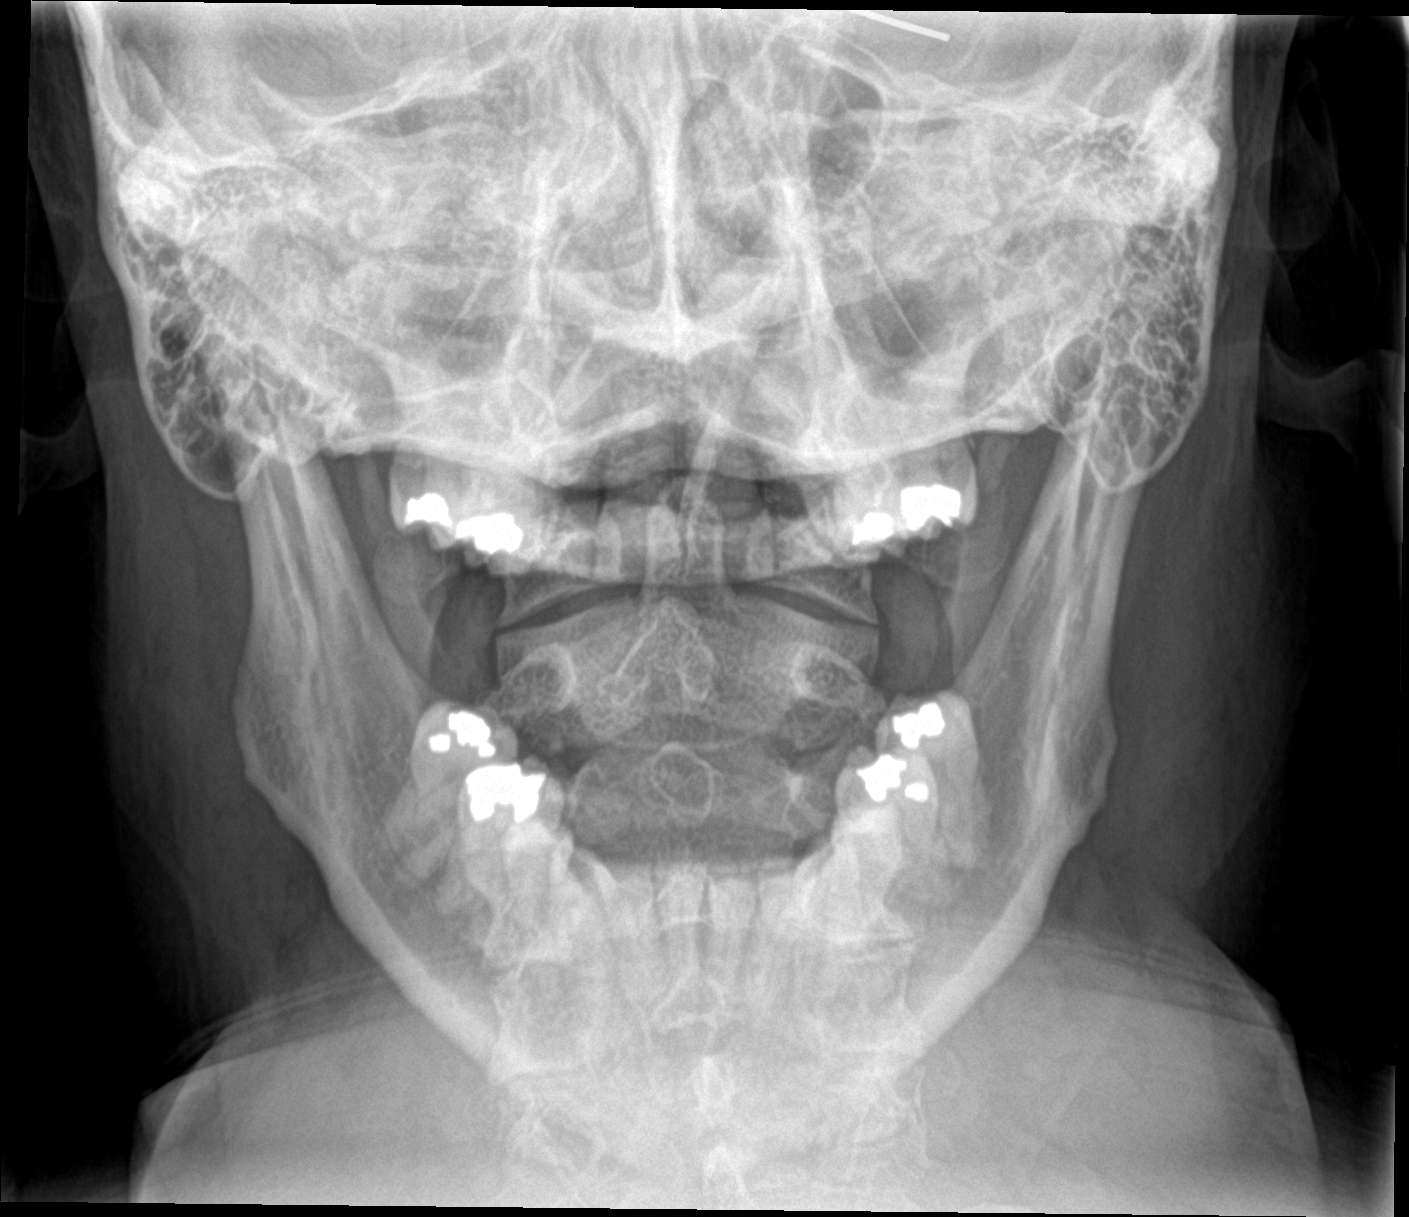

[3 of 3 positions shown; findings below may reference images not displayed]

FINDINGS: Minimal anterior spur formation at the C6-7 level. Otherwise,
unremarkable bones and soft tissues.
IMPRESSION: Minimal degenerative change at the C6-7 level.

## 2020-09-07 NOTE — Telephone Encounter (Signed)
Called and left generic message for patient to call back to be scheduled

## 2020-09-12 NOTE — Telephone Encounter (Signed)
Called and spoke with patient about scheduling appointment. Patient is awaiting on [facility]. Patient asked to defer referral a couple of weeks she will call back to be scheduled.

## 2020-10-30 ENCOUNTER — Ambulatory Visit: Payer: Medicaid Other | Admitting: Pain Medicine

## 2020-10-31 ENCOUNTER — Encounter: Payer: Medicaid Other | Admitting: Obstetrics and Gynecology

## 2020-11-06 NOTE — Progress Notes (Signed)
PROVIDER NOTE: Information contained herein reflects review and annotations entered in association with encounter. Interpretation of such information and data should be left to medically-trained personnel. Information provided to patient can be located elsewhere in the medical record under "Patient Instructions". Document created using STT-dictation technology, any transcriptional errors that may result from process are unintentional.    Patient: Dana Bradley  Service Category: E/M  Provider: Gaspar Cola, MD  DOB: 1971/08/29  DOS: 11/08/2020  Specialty: Interventional Pain Management  MRN: [id number]  Setting: Ambulatory outpatient  PCP: Delsa Grana, PA-C  Type: Established Patient    Referring Provider: Delsa Grana, PA-C  Location: Office  Delivery: Face-to-face     HPI  Ms. Teliah Buffalo, a 50 y.o. year old female, is here today because of her Chronic pain syndrome [G89.4]. Ms. Martin primary complain today is Back Pain Last encounter: My last encounter with her was on Visit date not found. Pertinent problems: Ms. Rickles has Cervico-occipital neuralgia; DDD (degenerative disc disease), lumbosacral; Chronic pain of multiple joints; DDD (degenerative disc disease), lumbar; Fibromyalgia; Migraine without aura and with status migrainosus, not intractable; Chronic lower extremity cramps (Bilateral) (R>L); Chronic pain syndrome; Neurogenic pain; Chronic low back pain (Primary Area of Pain) (Bilateral) (R>L) (midline); Chronic upper back pain (Secondary area of Pain) (Bilateral) (L>R); Chronic abdominal pain (Right lower quadrant); Thoracic radiculitis (Bilateral: T10, T11); Chronic occipital neuralgia (Third area of Pain) (Bilateral) (L>R); Chronic neck pain; Chronic cervical radicular pain (Bilateral) (L>R); Chronic shoulder blade pain (Bilateral) (L>R); Chronic upper extremity pain (Bilateral) (R>L); Chronic knee pain (Bilateral) (R>L); Chronic ankle pain (Bilateral); Cervical spondylosis with myelopathy  and radiculopathy; Nephrolithiasis; Plantar fasciitis of left foot; Cervicogenic headache; Bilateral leg edema; Chronic hip pain (Left); Chronic sacroiliac joint pain (Left); Lumbar facet syndrome (Bilateral) (L>R); Lumbar radiculitis (Left); Lumbar spondylosis; Migraine headache; Muscle spasticity; Osteoarthritis of shoulder (Bilateral); Lumbar L1-2 disc protrusion (Right); Myofascial pain syndrome (Left) (trapezius muscle); Trigger point of shoulder region (Left); Chronic fatigue syndrome with fibromyalgia; Occipital headache; Trigger point of neck (Left); Malar rash; Chronic migraine without aura, with intractable migraine, so stated, with status migrainosus; Weakness of leg (Left); Difficulty walking; Migraine with aura and with status migrainosus, not intractable; Cervico-occipital neuralgia of left side; DDD (degenerative disc disease), cervical; Cervical facet syndrome (Bilateral) (L>R); DDD (degenerative disc disease), thoracic; Osteoarthritis of hip (Left); Chronic groin pain (Bilateral) (L>R); Chronic hip pain (Bilateral) (L>R); Somatic dysfunction of sacroiliac joint (Bilateral); Other specified dorsopathies, sacral and sacrococcygeal region; Intractable migraine with aura without status migrainosus; Chronic lower extremity pain (Bilateral); Lumbar radiculitis (Right); Neurogenic urinary incontinence; Other intervertebral disc degeneration, lumbar region; Spinal stenosis of lumbosacral region; and Hyperalgesia on their pertinent problem list. Pain Assessment: Severity of Chronic pain is reported as a 8 /10. Location: Back Lower/pain radiaties down both leg to her toes, across rib cage, hurts to breathe. Onset: More than a month ago. Quality: Stabbing,Nagging,Numbness,Burning,Aching,Constant,Pressure,Tightness. Timing: Constant. Modifying factor(s): meds, repositioning, heat. Vitals:  height is 5' 3" (1.6 m) and weight is 230 lb (104.3 kg). Her temperature is 97.2 F (36.2 C) (abnormal). Her blood  pressure is 125/71 and her pulse is 86. Her oxygen saturation is 99%.   Reason for encounter: medication management.   The patient indicates doing well with the current medication regimen. No adverse reactions or side effects reported to the medications.   Functional capacity evaluation (FCE) completed on 06/28/2020.  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  Phone: [phone].  Fax: [phone])] report scanned into system. Minimal overall level of work: Sedentary range. Tolerance: Sedentary level of work for the 8 hours/day; 40 hours/week. Self-limiting behavior: 33%, exceeding normal limits.  Performance on the self-limiting task indicates a minimum rather than a maximum ability.  A maximum on the tasks performed could not be determined due to the self-limiting behavior.  (Self-limiting behavior: Pain; psychosocial issues such as fear of reinjury, anxiety, or depression; and/or attempt to manipulate test results.)  RTCB: 02/06/2021 Nonopioid transferred 11/08/2020: Lyrica, baclofen, and Voltaren gel (She gets it over the counter)  Pharmacotherapy Assessment   Analgesic: Hydrocodone/APAP 7.5/325, 1 tab PO q 6 hrs. she appears to be using less than 10 pills/month. MME/day:3.23m/day.   Monitoring: [place] PMP: PDMP reviewed during this encounter.       Pharmacotherapy: No side-effects or adverse reactions reported. Compliance: No problems identified. Effectiveness: Clinically acceptable.  BChauncey Fischer RN  11/08/2020  2:59 PM  Sign when Signing Visit Safety precautions to be maintained throughout the outpatient stay will include: orient to surroundings, keep bed in low position, maintain call bell within reach at all times, provide assistance with transfer out of bed and ambulation.     UDS:  Summary  Date Value Ref Range Status  03/05/2018 FINAL  Final    Comment:     ==================================================================== TOXASSURE SELECT 13 (MW) ==================================================================== Test                             Result       Flag       Units Drug Present   Hydrocodone                    422                     ng/mg creat   Hydromorphone                  105                     ng/mg creat   Norhydrocodone                 312                     ng/mg creat    Sources of hydrocodone include scheduled prescription    medications. Hydromorphone and norhydrocodone are expected    metabolites of hydrocodone. Hydromorphone is also available as a    scheduled prescription medication. ==================================================================== Test                      Result    Flag   Units      Ref Range   Creatinine              148              mg/dL      >=20 ==================================================================== Declared Medications:  Medication list was not provided. ==================================================================== For clinical consultation, please call [phone] ====================================================================      ROS  Constitutional: Denies any fever or chills Gastrointestinal: No reported hemesis, hematochezia, vomiting, or acute GI distress Musculoskeletal: Denies any acute onset joint swelling, redness, loss of ROM, or weakness Neurological: No reported episodes of acute onset apraxia, aphasia, dysarthria, agnosia, amnesia, paralysis, loss of coordination, or loss of consciousness  Medication Review  Dexlansoprazole,  HYDROcodone-acetaminophen, Melatonin, Valerian, Vitamin B-12, Vitamin D (Ergocalciferol), atorvastatin, baclofen, butalbital-acetaminophen-caffeine, diclofenac Sodium, dicyclomine, dihydroergotamine, lipase/protease/amylase, magnesium oxide, metoprolol tartrate, mupirocin ointment, omeprazole, ondansetron, and  pregabalin  History Review  Allergy: Ms. Hendel is allergic to aspirin, cymbalta [duloxetine hcl], depakote [divalproex sodium], gadolinium derivatives, haloperidol, meperidine, reglan [metoclopramide], tramadol hcl, trazodone, compazine [prochlorperazine], meloxicam, penicillins, tomato, other, shellfish allergy, shellfish-derived products, bacitracin-neomycin-polymyxin, cephalosporins, ibuprofen, latex, neosporin [neomycin-bacitracin zn-polymyx], nsaids, sulfa antibiotics, and sulfonamide derivatives. Drug: Ms. Broxton  reports no history of drug use. Alcohol:  reports no history of alcohol use. Tobacco:  reports that she quit smoking about 27 years ago. Her smoking use included cigarettes. She has a 12.00 pack-year smoking history. She has never used smokeless tobacco. Social: Ms. Mcgregory  reports that she quit smoking about 27 years ago. Her smoking use included cigarettes. She has a 12.00 pack-year smoking history. She has never used smokeless tobacco. She reports that she does not drink alcohol and does not use drugs. Medical:  has a past medical history of Acute postoperative pain (04/07/2017), Anxiety, Bursitis, Chronic fatigue (12/12/2017), Chronic fatigue syndrome, Colitis (2021), Edema leg (05/02/2015), Fibromyalgia, GERD (gastroesophageal reflux disease), IBS (irritable bowel syndrome), Knee pain, bilateral (12/21/2008), Lumbar discitis, Migraines, Osteoarthritis, Right hand pain (04/10/2015), Sleep apnea, Spinal stenosis, SVT (supraventricular tachycardia) ([place]), Vertigo, and Vitamin D deficiency (05/01/2016). Surgical: Ms. Araki  has a past surgical history that includes Ablation; Tubal ligation (10/01/99); Cardiac catheterization; Knee arthroscopy; Colonoscopy with propofol (N/A, 05/17/2015); Esophagogastroduodenoscopy (N/A, 05/17/2015); spg; Tibial Tubercle Bypass (Right, 1998); and Colonoscopy with propofol (N/A, 03/20/2020). Family: family history includes Alcohol abuse in her father; Alzheimer's disease  in her paternal grandmother and another family member; Aneurysm in her maternal grandfather; Anxiety disorder in her father, mother, sister, and sister; Arthritis in her maternal grandmother; Bipolar disorder in her sister; COPD in her paternal grandfather; Cancer in her mother; Cancer (age of onset: 60) in her maternal grandmother; Depression in her father, mother, sister, and sister; Diabetes in her sister; Heart attack in her paternal grandfather; Heart disease in her father, maternal grandfather, and paternal grandfather; Hyperlipidemia in her maternal grandmother, mother, and sister; Hypertension in her father, maternal grandfather, mother, paternal grandfather, sister, and sister; Migraines in her daughter, daughter, mother, sister, sister, and son; Polycystic ovary syndrome in her sister; Stroke in her father; Thyroid disease in her maternal grandmother.  Laboratory Chemistry Profile   Renal Lab Results  Component Value Date   BUN 9 02/22/2020   CREATININE 0.65 70/09/7492   BCR NOT APPLICABLE 49/67/5916   GFRAA >60 02/22/2020   GFRNONAA >60 02/22/2020     Hepatic Lab Results  Component Value Date   AST 28 02/22/2020   ALT 37 02/22/2020   ALBUMIN 4.1 02/22/2020   ALKPHOS 83 02/22/2020   HCVAB NON REACTIVE 09/21/2019     Electrolytes Lab Results  Component Value Date   NA 140 02/22/2020   K 3.8 02/22/2020   CL 105 02/22/2020   CALCIUM 9.3 02/22/2020   MG 2.1 04/01/2017     Bone Lab Results  Component Value Date   VD25OH 16.49 (L) 02/22/2020   25OHVITD1 20 (L) 04/01/2017   25OHVITD2 5.4 04/01/2017   25OHVITD3 15 04/01/2017     Inflammation (CRP: Acute Phase) (ESR: Chronic Phase) Lab Results  Component Value Date   CRP 6.0 07/30/2018   ESRSEDRATE 17 07/30/2018       Note: Above Lab results reviewed.  Recent Imaging Review  DG Cervical Spine 2 or 3 views CLINICAL DATA:  Neck pain. Migraine headaches. No known injury.  EXAM: CERVICAL SPINE - 2-3  VIEW  COMPARISON:  07/09/2019  FINDINGS: Minimal anterior spur formation at the C6-7 level. Otherwise, unremarkable bones and soft tissues.  IMPRESSION: Minimal degenerative change at the C6-7 level.  Electronically Signed   By: Claudie Revering M.D.   On: 04/18/2020 16:19 Note: Reviewed        Physical Exam  General appearance: Well nourished, well developed, and well hydrated. In no apparent acute distress Mental status: Alert, oriented x 3 (person, place, & time)       Respiratory: No evidence of acute respiratory distress Eyes: PERLA Vitals: BP 125/71   Pulse 86   Temp (!) 97.2 F (36.2 C)   Ht 5' 3" (1.6 m)   Wt 230 lb (104.3 kg)   SpO2 99%   BMI 40.74 kg/m  BMI: Estimated body mass index is 40.74 kg/m as calculated from the following:   Height as of this encounter: 5' 3" (1.6 m).   Weight as of this encounter: 230 lb (104.3 kg). Ideal: Ideal body weight: 52.4 kg (115 lb 8.3 oz) Adjusted ideal body weight: 73.2 kg (161 lb 5 oz)  Assessment   Status Diagnosis  Worsened Worsening Worsened 1. Chronic pain syndrome   2. Chronic low back pain (Primary Area of Pain) (Bilateral) (R>L) (midline)   3. 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 Lumbar radiculitis (Right)   5. Lumbar radiculitis (Left)   6. DDD (degenerative disc disease), lumbar   7. Chronic upper back pain (Secondary area of Pain) (Bilateral) (L>R)   8. Thoracic radiculitis (Bilateral: T10, T11)   9. DDD (degenerative disc disease), thoracic   10. Pharmacologic therapy   11. Uncomplicated opioid dependence ([place])   12. Fibromyalgia   13. Muscle spasticity      Updated Problems: Problem  Uncomplicated Opioid Dependence ([facility])    Plan of Care  Problem-specific:  No problem-specific Assessment & Plan notes found for this encounter.  Ms. Gao Mitnick has a current medication list which includes the following long-term medication(s): atorvastatin, dexilant, dihydroergotamine, metoprolol tartrate,  omeprazole, baclofen, diclofenac sodium, dicyclomine, hydrocodone-acetaminophen, and pregabalin.  Pharmacotherapy (Medications Ordered): Meds ordered this encounter  Medications  . pregabalin (LYRICA) 150 MG capsule    Sig: Take 1 capsule (150 mg total) by mouth every 8 (eight) hours.    Dispense:  90 capsule    Refill:  2    Fill one day early if pharmacy is closed on scheduled refill date. May substitute for generic if available.  . baclofen (LIORESAL) 10 MG tablet    Sig: Take 1 tablet (10 mg total) by mouth 3 (three) times daily.    Dispense:  90 tablet    Refill:  2    Fill one day early if pharmacy is closed on scheduled refill date. May substitute for generic if available.   Orders:  Orders Placed This Encounter  Procedures  . Thoracic Epidural Injection    Standing Status:   Future    Standing Expiration Date:   12/06/2020    Scheduling Instructions:     Level: T7-8     Laterality: Midline     Sedation: With Sedation.     Timeframe: ASAP    Order Specific Question:   Where will this procedure be performed?    Answer:   [facility]  . Lumbar Transforaminal Epidural    Standing Status:   Future    Standing Expiration Date:  12/06/2020    Scheduling Instructions:     Side: Bilateral     Level: L2     Sedation: Patient's choice.     Timeframe: ASAP    Order Specific Question:   Where will this procedure be performed?    Answer:   [facility]   Follow-up plan:   Return for Procedure (w/ sedation): (ML) T8-9 TESI + (B) L2 TFESI .      Interventional treatment options:  Under consideration:  Neurosurgical referral for decompressive laminectomy and/or discectomy   Therapeutic/palliative (PRN): Diagnostic left SI joint block #2  Diagnostic/therapeutic left IA hip joint injection #2  Diagnostic bilateral lumbar facet block #2  Palliative left trapezius muscle MNB #4 Palliative right trapezius muscle MNB #2 Palliative left L1-2 LESI  #2 Palliative right L1-2 LESI #2 Palliativeleft CESI #3 Palliative left greater occipital NB #3  Palliative left C2 + TON NB #2  Palliative left GON + C2 + TON RFA #2 (last done 12/25/2017) Diagnostic/therapeutic bilateral L2 TFESI #2 (done on 03/07/2020) (100/100/100/>50)    Recent Visits No visits were found meeting these conditions. Showing recent visits within past 90 days and meeting all other requirements Today's Visits Date Type Provider Dept  11/08/20 Office Visit Milinda Pointer, MD [facility]  Showing today's visits and meeting all other requirements Future Appointments Date Type Provider Dept  11/09/20 Appointment Milinda Pointer, MD [facility]  Showing future appointments within next 90 days and meeting all other requirements  I discussed the assessment and treatment plan with the patient. The patient was provided an opportunity to ask questions and all were answered. The patient agreed with the plan and demonstrated an understanding of the instructions.  Patient advised to call back or seek an in-person evaluation if the symptoms or condition worsens.  Duration of encounter: 26 minutes.  Note by: Gaspar Cola, MD Date: 11/08/2020; Time: 3:56 PM

## 2020-11-08 ENCOUNTER — Other Ambulatory Visit: Payer: Self-pay | Admitting: Pain Medicine

## 2020-11-08 ENCOUNTER — Encounter: Payer: Self-pay | Admitting: Pain Medicine

## 2020-11-08 ENCOUNTER — Ambulatory Visit: Payer: Self-pay | Attending: Pain Medicine | Admitting: Pain Medicine

## 2020-11-08 ENCOUNTER — Other Ambulatory Visit: Payer: Self-pay

## 2020-11-08 VITALS — BP 125/71 | HR 86 | Temp 97.2°F | Ht 63.0 in | Wt 230.0 lb

## 2020-11-08 DIAGNOSIS — M5414 Radiculopathy, thoracic region: Secondary | ICD-10-CM

## 2020-11-08 DIAGNOSIS — M797 Fibromyalgia: Secondary | ICD-10-CM

## 2020-11-08 DIAGNOSIS — M5134 Other intervertebral disc degeneration, thoracic region: Secondary | ICD-10-CM

## 2020-11-08 DIAGNOSIS — M549 Dorsalgia, unspecified: Secondary | ICD-10-CM

## 2020-11-08 DIAGNOSIS — G894 Chronic pain syndrome: Secondary | ICD-10-CM

## 2020-11-08 DIAGNOSIS — M62838 Other muscle spasm: Secondary | ICD-10-CM

## 2020-11-08 DIAGNOSIS — M5416 Radiculopathy, lumbar region: Secondary | ICD-10-CM

## 2020-11-08 DIAGNOSIS — M5126 Other intervertebral disc displacement, lumbar region: Secondary | ICD-10-CM

## 2020-11-08 DIAGNOSIS — Z79899 Other long term (current) drug therapy: Secondary | ICD-10-CM

## 2020-11-08 DIAGNOSIS — M5442 Lumbago with sciatica, left side: Secondary | ICD-10-CM

## 2020-11-08 DIAGNOSIS — F1120 Opioid dependence, uncomplicated: Secondary | ICD-10-CM

## 2020-11-08 DIAGNOSIS — M5136 Other intervertebral disc degeneration, lumbar region: Secondary | ICD-10-CM

## 2020-11-08 DIAGNOSIS — G8929 Other chronic pain: Secondary | ICD-10-CM

## 2020-11-08 DIAGNOSIS — M5441 Lumbago with sciatica, right side: Secondary | ICD-10-CM

## 2020-11-08 MED ORDER — PREGABALIN 150 MG PO CAPS: 150.0000 mg | ORAL_CAPSULE | Freq: Three times a day (TID) | ORAL | 2 refills | Status: DC

## 2020-11-08 MED ORDER — BACLOFEN 10 MG PO TABS: 10.0000 mg | ORAL_TABLET | Freq: Three times a day (TID) | ORAL | 2 refills | Status: DC

## 2020-11-08 NOTE — Patient Instructions (Signed)
____________________________________________________________________________________________  Preparing for Procedure with Sedation  Procedure appointments are limited to planned procedures: . No Prescription Refills. . No disability issues will be discussed. . No medication changes will be discussed.  Instructions: . Oral Intake: Do not eat or drink anything for at least 8 hours prior to your procedure. (Exception: Blood Pressure Medication. See below.) . Transportation: Unless otherwise stated by your physician, you may drive yourself after the procedure. . Blood Pressure Medicine: Do not forget to take your blood pressure medicine with a sip of water the morning of the procedure. If your Diastolic (lower reading)is above 100 mmHg, elective cases will be cancelled/rescheduled. . Blood thinners: These will need to be stopped for procedures. Notify our staff if you are taking any blood thinners. Depending on which one you take, there will be specific instructions on how and when to stop it. . Diabetics on insulin: Notify the staff so that you can be scheduled 1st case in the morning. If your diabetes requires high dose insulin, take only  of your normal insulin dose the morning of the procedure and notify the staff that you have done so. . Preventing infections: Shower with an antibacterial soap the morning of your procedure. . Build-up your immune system: Take 1000 mg of Vitamin C with every meal (3 times a day) the day prior to your procedure. . Antibiotics: Inform the staff if you have a condition or reason that requires you to take antibiotics before dental procedures. . Pregnancy: If you are pregnant, call and cancel the procedure. . Sickness: If you have a cold, fever, or any active infections, call and cancel the procedure. . Arrival: You must be in the facility at least 30 minutes prior to your scheduled procedure. . Children: Do not bring children with you. . Dress appropriately:  Bring dark clothing that you would not mind if they get stained. . Valuables: Do not bring any jewelry or valuables.  Reasons to call and reschedule or cancel your procedure: (Following these recommendations will minimize the risk of a serious complication.) . Surgeries: Avoid having procedures within 2 weeks of any surgery. (Avoid for 2 weeks before or after any surgery). . Flu Shots: Avoid having procedures within 2 weeks of a flu shots or . (Avoid for 2 weeks before or after immunizations). . Barium: Avoid having a procedure within 7-10 days after having had a radiological study involving the use of radiological contrast. (Myelograms, Barium swallow or enema study). . Heart attacks: Avoid any elective procedures or surgeries for the initial 6 months after a "Myocardial Infarction" (Heart Attack). . Blood thinners: It is imperative that you stop these medications before procedures. Let us know if you if you take any blood thinner.  . Infection: Avoid procedures during or within two weeks of an infection (including chest colds or gastrointestinal problems). Symptoms associated with infections include: Localized redness, fever, chills, night sweats or profuse sweating, burning sensation when voiding, cough, congestion, stuffiness, runny nose, sore throat, diarrhea, nausea, vomiting, cold or Flu symptoms, recent or current infections. It is specially important if the infection is over the area that we intend to treat. . Heart and lung problems: Symptoms that may suggest an active cardiopulmonary problem include: cough, chest pain, breathing difficulties or shortness of breath, dizziness, ankle swelling, uncontrolled high or unusually low blood pressure, and/or palpitations. If you are experiencing any of these symptoms, cancel your procedure and contact your primary care physician for an evaluation.  Remember:  Regular Business hours are:    Monday to Thursday 8:00 AM to 4:00 PM  Provider's  Schedule: Kyan Yurkovich, MD:  Procedure days: Tuesday and Thursday 7:30 AM to 4:00 PM  Bilal Lateef, MD:  Procedure days: Monday and Wednesday 7:30 AM to 4:00 PM ____________________________________________________________________________________________   ____________________________________________________________________________________________  General Risks and Possible Complications  Patient Responsibilities: It is important that you read this as it is part of your informed consent. It is our duty to inform you of the risks and possible complications associated with treatments offered to you. It is your responsibility as a patient to read this and to ask questions about anything that is not clear or that you believe was not covered in this document.  Patient's Rights: You have the right to refuse treatment. You also have the right to change your mind, even after initially having agreed to have the treatment done. However, under this last option, if you wait until the last second to change your mind, you may be charged for the materials used up to that point.  Introduction: Medicine is not an exact science. Everything in Medicine, including the lack of treatment(s), carries the potential for danger, harm, or loss (which is by definition: Risk). In Medicine, a complication is a secondary problem, condition, or disease that can aggravate an already existing one. All treatments carry the risk of possible complications. The fact that a side effects or complications occurs, does not imply that the treatment was conducted incorrectly. It must be clearly understood that these can happen even when everything is done following the highest safety standards.  No treatment: You can choose not to proceed with the proposed treatment alternative. The "PRO(s)" would include: avoiding the risk of complications associated with the therapy. The "CON(s)" would include: not getting any of the treatment  benefits. These benefits fall under one of three categories: diagnostic; therapeutic; and/or palliative. Diagnostic benefits include: getting information which can ultimately lead to improvement of the disease or symptom(s). Therapeutic benefits are those associated with the successful treatment of the disease. Finally, palliative benefits are those related to the decrease of the primary symptoms, without necessarily curing the condition (example: decreasing the pain from a flare-up of a chronic condition, such as incurable terminal cancer).  General Risks and Complications: These are associated to most interventional treatments. They can occur alone, or in combination. They fall under one of the following six (6) categories: no benefit or worsening of symptoms; bleeding; infection; nerve damage; allergic reactions; and/or death. 1. No benefits or worsening of symptoms: In Medicine there are no guarantees, only probabilities. No healthcare provider can ever guarantee that a medical treatment will work, they can only state the probability that it may. Furthermore, there is always the possibility that the condition may worsen, either directly, or indirectly, as a consequence of the treatment. 2. Bleeding: This is more common if the patient is taking a blood thinner, either prescription or over the counter (example: Goody Powders, Fish oil, Aspirin, Garlic, etc.), or if suffering a condition associated with impaired coagulation (example: Hemophilia, cirrhosis of the liver, low platelet counts, etc.). However, even if you do not have one on these, it can still happen. If you have any of these conditions, or take one of these drugs, make sure to notify your treating physician. 3. Infection: This is more common in patients with a compromised immune system, either due to disease (example: diabetes, cancer, human immunodeficiency virus [HIV], etc.), or due to medications or treatments (example: therapies used to treat  cancer and   rheumatological diseases). However, even if you do not have one on these, it can still happen. If you have any of these conditions, or take one of these drugs, make sure to notify your treating physician. 4. Nerve Damage: This is more common when the treatment is an invasive one, but it can also happen with the use of medications, such as those used in the treatment of cancer. The damage can occur to small secondary nerves, or to large primary ones, such as those in the spinal cord and brain. This damage may be temporary or permanent and it may lead to impairments that can range from temporary numbness to permanent paralysis and/or brain death. 5. Allergic Reactions: Any time a substance or material comes in contact with our body, there is the possibility of an allergic reaction. These can range from a mild skin rash (contact dermatitis) to a severe systemic reaction (anaphylactic reaction), which can result in death. 6. Death: In general, any medical intervention can result in death, most of the time due to an unforeseen complication. ____________________________________________________________________________________________   

## 2020-11-08 NOTE — Progress Notes (Signed)
Safety precautions to be maintained throughout the outpatient stay will include: orient to surroundings, keep bed in low position, maintain call bell within reach at all times, provide assistance with transfer out of bed and ambulation.  

## 2020-11-09 ENCOUNTER — Ambulatory Visit (HOSPITAL_BASED_OUTPATIENT_CLINIC_OR_DEPARTMENT_OTHER): Payer: Self-pay | Admitting: Pain Medicine

## 2020-11-09 ENCOUNTER — Ambulatory Visit
Admission: RE | Admit: 2020-11-09 | Discharge: 2020-11-09 | Disposition: A | Discharge status: Home / Self Care | Payer: Self-pay | Source: Ambulatory Visit | Attending: Pain Medicine | Admitting: Pain Medicine

## 2020-11-09 ENCOUNTER — Encounter: Payer: Self-pay | Admitting: Pain Medicine

## 2020-11-09 ENCOUNTER — Other Ambulatory Visit: Payer: Self-pay

## 2020-11-09 VITALS — BP 118/62 | HR 71 | Temp 97.5°F | Resp 18 | Ht 63.0 in | Wt 230.0 lb

## 2020-11-09 DIAGNOSIS — R55 Syncope and collapse: Secondary | ICD-10-CM

## 2020-11-09 DIAGNOSIS — M62830 Muscle spasm of back: Secondary | ICD-10-CM | POA: Insufficient documentation

## 2020-11-09 DIAGNOSIS — Z91041 Radiographic dye allergy status: Secondary | ICD-10-CM | POA: Insufficient documentation

## 2020-11-09 DIAGNOSIS — M5414 Radiculopathy, thoracic region: Secondary | ICD-10-CM

## 2020-11-09 DIAGNOSIS — R937 Abnormal findings on diagnostic imaging of other parts of musculoskeletal system: Secondary | ICD-10-CM | POA: Insufficient documentation

## 2020-11-09 DIAGNOSIS — M5136 Other intervertebral disc degeneration, lumbar region: Secondary | ICD-10-CM

## 2020-11-09 DIAGNOSIS — G8929 Other chronic pain: Secondary | ICD-10-CM | POA: Insufficient documentation

## 2020-11-09 DIAGNOSIS — M5114 Intervertebral disc disorders with radiculopathy, thoracic region: Secondary | ICD-10-CM

## 2020-11-09 DIAGNOSIS — M5126 Other intervertebral disc displacement, lumbar region: Secondary | ICD-10-CM

## 2020-11-09 DIAGNOSIS — M4804 Spinal stenosis, thoracic region: Secondary | ICD-10-CM | POA: Insufficient documentation

## 2020-11-09 DIAGNOSIS — M5442 Lumbago with sciatica, left side: Secondary | ICD-10-CM | POA: Insufficient documentation

## 2020-11-09 DIAGNOSIS — H8110 Benign paroxysmal vertigo, unspecified ear: Secondary | ICD-10-CM | POA: Insufficient documentation

## 2020-11-09 DIAGNOSIS — M549 Dorsalgia, unspecified: Secondary | ICD-10-CM | POA: Insufficient documentation

## 2020-11-09 DIAGNOSIS — M5416 Radiculopathy, lumbar region: Secondary | ICD-10-CM

## 2020-11-09 DIAGNOSIS — M5441 Lumbago with sciatica, right side: Secondary | ICD-10-CM | POA: Insufficient documentation

## 2020-11-09 DIAGNOSIS — Z91040 Latex allergy status: Secondary | ICD-10-CM | POA: Insufficient documentation

## 2020-11-09 DIAGNOSIS — M546 Pain in thoracic spine: Secondary | ICD-10-CM | POA: Insufficient documentation

## 2020-11-09 DIAGNOSIS — M5134 Other intervertebral disc degeneration, thoracic region: Secondary | ICD-10-CM

## 2020-11-09 MED ORDER — ROPIVACAINE HCL 2 MG/ML IJ SOLN
INTRAMUSCULAR | Status: AC
  Filled 2020-11-09: qty 20

## 2020-11-09 MED ORDER — SODIUM CHLORIDE 0.9% FLUSH
2.0000 mL | Freq: Once | INTRAVENOUS | Status: AC
  Administered 2020-11-09: 2 mL

## 2020-11-09 MED ORDER — DEXAMETHASONE SODIUM PHOSPHATE 10 MG/ML IJ SOLN
20.0000 mg | Freq: Once | INTRAMUSCULAR | Status: AC
  Administered 2020-11-09: 20 mg

## 2020-11-09 MED ORDER — ROPIVACAINE HCL 2 MG/ML IJ SOLN
2.0000 mL | Freq: Once | INTRAMUSCULAR | Status: AC
  Administered 2020-11-09: 2 mL via EPIDURAL

## 2020-11-09 MED ORDER — MIDAZOLAM HCL 5 MG/5ML IJ SOLN
1.0000 mg | INTRAMUSCULAR | Status: DC | PRN
  Administered 2020-11-09 (×2): 2 mg via INTRAVENOUS

## 2020-11-09 MED ORDER — FENTANYL CITRATE (PF) 100 MCG/2ML IJ SOLN
INTRAMUSCULAR | Status: AC
  Filled 2020-11-09: qty 2

## 2020-11-09 MED ORDER — GLYCOPYRROLATE 0.2 MG/ML IJ SOLN
0.2000 mg | Freq: Once | INTRAMUSCULAR | Status: AC
  Administered 2020-11-09: 0.2 mg via INTRAVENOUS

## 2020-11-09 MED ORDER — MIDAZOLAM HCL 5 MG/5ML IJ SOLN
INTRAMUSCULAR | Status: AC
  Filled 2020-11-09: qty 5

## 2020-11-09 MED ORDER — FENTANYL CITRATE (PF) 100 MCG/2ML IJ SOLN
25.0000 ug | INTRAMUSCULAR | Status: DC | PRN
  Administered 2020-11-09: 50 ug via INTRAVENOUS

## 2020-11-09 MED ORDER — SODIUM CHLORIDE (PF) 0.9 % IJ SOLN
INTRAMUSCULAR | Status: AC
  Filled 2020-11-09: qty 20

## 2020-11-09 MED ORDER — TRIAMCINOLONE ACETONIDE 40 MG/ML IJ SUSP
INTRAMUSCULAR | Status: AC
  Filled 2020-11-09: qty 1

## 2020-11-09 MED ORDER — GLYCOPYRROLATE 0.2 MG/ML IJ SOLN
INTRAMUSCULAR | Status: AC
  Filled 2020-11-09: qty 1

## 2020-11-09 MED ORDER — LACTATED RINGERS IV SOLN
1000.0000 mL | Freq: Once | INTRAVENOUS | Status: AC
  Administered 2020-11-09: 1000 mL via INTRAVENOUS

## 2020-11-09 MED ORDER — DEXAMETHASONE SODIUM PHOSPHATE 10 MG/ML IJ SOLN
INTRAMUSCULAR | Status: AC
  Filled 2020-11-09: qty 2

## 2020-11-09 MED ORDER — DEXAMETHASONE SODIUM PHOSPHATE 10 MG/ML IJ SOLN
10.0000 mg | Freq: Once | INTRAMUSCULAR | Status: AC
  Administered 2020-11-09: 10 mg

## 2020-11-09 MED ORDER — TRIAMCINOLONE ACETONIDE 40 MG/ML IJ SUSP
40.0000 mg | Freq: Once | INTRAMUSCULAR | Status: AC
  Administered 2020-11-09: 40 mg

## 2020-11-09 MED ORDER — LIDOCAINE HCL (PF) 2 % IJ SOLN
INTRAMUSCULAR | Status: AC
  Filled 2020-11-09: qty 10

## 2020-11-09 MED ORDER — LIDOCAINE HCL 2 % IJ SOLN
20.0000 mL | Freq: Once | INTRAMUSCULAR | Status: AC
  Administered 2020-11-09: 400 mg
  Filled 2020-11-09: qty 20

## 2020-11-09 NOTE — Progress Notes (Signed)
PROVIDER NOTE: Information contained herein reflects review and annotations entered in association with encounter. Interpretation of such information and data should be left to medically-trained personnel. Information provided to patient can be located elsewhere in the medical record under "Patient Instructions". Document created using STT-dictation technology, any transcriptional errors that may result from process are unintentional.    Patient: Dana Bradley  Service Category: Procedure  Provider: Gaspar Cola, MD  DOB: 05-14-1971  DOS: 11/09/2020  Location: [facility]  MRN: [id number]  Setting: Ambulatory - outpatient  Referring Provider: Delsa Grana, PA-C  Type: Established Patient  Specialty: Interventional Pain Management  PCP: Dana Grana, PA-C   Primary Reason for Visit: Interventional Pain Management Treatment. CC: Back Pain (lower)  Procedure #1:  Anesthesia, Analgesia, Anxiolysis:  Type: Trans-Foraminal Epidural Steroid Injection #2  Purpose: Diagnostic/Therapeutic Region: Posterolateral Lumbosacral Target Area: The 6 o'clock position under the pedicle, on the affected side. Approach: Posterior Percutaneous Paravertebral approach. Level: L2 Level Laterality: Bilateral Paravertebral  Type: Moderate (Conscious) Sedation combined with Local Anesthesia Indication(s): Analgesia and Anxiety Route: Intravenous (IV) IV Access: Secured Sedation: Meaningful verbal contact was maintained at all times during the procedure  Local Anesthetic: Lidocaine 1-2%  Position: Prone   Procedure #2:    Type: Therapeutic Inter-Laminar Thoracic Epidural Block  #2  Region: Posterior Thoracolumbar Level: T8-9 Laterality: Midline aiming to right Paraspinal      Indications: 1. DDD (degenerative disc disease), lumbar   2. Lumbar L1-2 disc protrusion (Right)   3. Lumbar radiculitis (Left)   4. Lumbar radiculitis (Right)   5. Chronic low back pain (1ry area of Pain) (Bilateral)  (R>L) (midline)   6. DDD (degenerative disc disease), thoracic   7. Chronic upper back pain (2ry area of Pain) (Bilateral) (L>R)   8. Chronic midline thoracic back pain   9. Thoracic radiculitis (Bilateral: T10, T11)   10. Thoracic spinal stenosis (T7-8)   11. 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 Spasm of muscle of lower back   13. Abnormal MRI, lumbar spine (03/16/2020)   14. Abnormal MRI, thoracic spine (08/02/2019)    History of allergy to radiographic contrast media    Latex precautions, history of latex allergy    History of vasovagal episode    Pain Score: Pre-procedure: 6 /10 Post-procedure: 2 /10   The patient indicates currently having some vertigo.  She states that this started a couple days ago and that it has happened before.  Pre-op H&P Assessment:  Dana Bradley is a 50 y.o. (year old), female patient, seen today for interventional treatment. She  has a past surgical history that includes Ablation; Tubal ligation (10/01/99); Cardiac catheterization; Knee arthroscopy; Colonoscopy with propofol (N/A, 05/17/2015); Esophagogastroduodenoscopy (N/A, 05/17/2015); spg; Tibial Tubercle Bypass (Right, 1998); and Colonoscopy with propofol (N/A, 03/20/2020). Dana Bradley has a current medication list which includes the following prescription(s): atorvastatin, baclofen, butalbital-acetaminophen-caffeine, vitamin b-12, dexilant, dihydroergotamine, lipase/protease/amylase, magnesium oxide, melatonin, metoprolol tartrate, mupirocin ointment, omeprazole, ondansetron, pregabalin, valerian, vitamin d (ergocalciferol), diclofenac sodium, dicyclomine, and hydrocodone-acetaminophen, and the following Facility-Administered Medications: fentanyl and midazolam. Her primarily concern today is the Back Pain (lower)  Initial Vital Signs:  Pulse/HCG Rate: 78ECG Heart Rate: 88 Temp: (!) 97.3 F (36.3 C) Resp: 14 BP: 110/74 SpO2: 98 %  BMI: Estimated body mass index is 40.74 kg/m as  calculated from the following:   Height as of this encounter: 5\' 3"  (1.6 m).   Weight as of this encounter: 230 lb (104.3 kg).  Risk  Assessment: Allergies: Reviewed. She is allergic to aspirin, cymbalta [duloxetine hcl], depakote [divalproex sodium], gadolinium derivatives, haloperidol, meperidine, reglan [metoclopramide], tramadol hcl, trazodone, compazine [prochlorperazine], meloxicam, penicillins, tomato, other, shellfish allergy, shellfish-derived products, bacitracin-neomycin-polymyxin, cephalosporins, ibuprofen, latex, neosporin [neomycin-bacitracin zn-polymyx], nsaids, sulfa antibiotics, and sulfonamide derivatives.  Allergy Precautions: None required Coagulopathies: Reviewed. None identified.  Blood-thinner therapy: None at this time Active Infection(s): Reviewed. None identified. Dana Bradley is afebrile  Site Confirmation: Dana Bradley was asked to confirm the procedure and laterality before marking the site Procedure checklist: Completed Consent: Before the procedure and under the influence of no sedative(s), amnesic(s), or anxiolytics, the patient was informed of the treatment options, risks and possible complications. To fulfill our ethical and legal obligations, as recommended by the American Medical Association's Code of Ethics, I have informed the patient of my clinical impression; the nature and purpose of the treatment or procedure; the risks, benefits, and possible complications of the intervention; the alternatives, including doing nothing; the risk(s) and benefit(s) of the alternative treatment(s) or procedure(s); and the risk(s) and benefit(s) of doing nothing. The patient was provided information about the general risks and possible complications associated with the procedure. These may include, but are not limited to: failure to achieve desired goals, infection, bleeding, organ or nerve damage, allergic reactions, paralysis, and death. In addition, the patient was informed of those  risks and complications associated to Spine-related procedures, such as failure to decrease pain; infection (i.e.: Meningitis, epidural or intraspinal abscess); bleeding (i.e.: epidural hematoma, subarachnoid hemorrhage, or any other type of intraspinal or peri-dural bleeding); organ or nerve damage (i.e.: Any type of peripheral nerve, nerve root, or spinal cord injury) with subsequent damage to sensory, motor, and/or autonomic systems, resulting in permanent pain, numbness, and/or weakness of one or several areas of the body; allergic reactions; (i.e.: anaphylactic reaction); and/or death. Furthermore, the patient was informed of those risks and complications associated with the medications. These include, but are not limited to: allergic reactions (i.e.: anaphylactic or anaphylactoid reaction(s)); adrenal axis suppression; blood sugar elevation that in diabetics may result in ketoacidosis or comma; water retention that in patients with history of congestive heart failure may result in shortness of breath, pulmonary edema, and decompensation with resultant heart failure; weight gain; swelling or edema; medication-induced neural toxicity; particulate matter embolism and blood vessel occlusion with resultant organ, and/or nervous system infarction; and/or aseptic necrosis of one or more joints. Finally, the patient was informed that Medicine is not an exact science; therefore, there is also the possibility of unforeseen or unpredictable risks and/or possible complications that may result in a catastrophic outcome. The patient indicated having understood very clearly. We have given the patient no guarantees and we have made no promises. Enough time was given to the patient to ask questions, all of which were answered to the patient's satisfaction. Ms. Upshur has indicated that she wanted to continue with the procedure. Attestation: I, the ordering provider, attest that I have discussed with the patient the benefits,  risks, side-effects, alternatives, likelihood of achieving goals, and potential problems during recovery for the procedure that I have provided informed consent. Date  Time: 11/09/2020  8:09 AM  Pre-Procedure Preparation:  Monitoring: As per clinic protocol. Respiration, ETCO2, SpO2, BP, heart rate and rhythm monitor placed and checked for adequate function Safety Precautions: Patient was assessed for positional comfort and pressure points before starting the procedure. Time-out: I initiated and conducted the "Time-out" before starting the procedure, as per protocol. The patient was asked to participate  by confirming the accuracy of the "Time Out" information. Verification of the correct person, site, and procedure were performed and confirmed by me, the nursing staff, and the patient. "Time-out" conducted as per Joint Commission's Universal Protocol (UP.01.01.01). Time: 0851  Description of Procedure #1:  Area Prepped: Entire Posterior Lumbosacral Area DuraPrep (Iodine Povacrylex [0.7% available iodine] and Isopropyl Alcohol, 74% w/w) Safety Precautions: Aspiration looking for blood return was conducted prior to all injections. At no point did we inject any substances, as a needle was being advanced. No attempts were made at seeking any paresthesias. Safe injection practices and needle disposal techniques used. Medications properly checked for expiration dates. SDV (single dose vial) medications used. Description of the Procedure: Protocol guidelines were followed. The patient was placed in position over the procedure table. The target area was identified and the area prepped in the usual manner. Skin & deeper tissues infiltrated with local anesthetic. Appropriate amount of time allowed to pass for local anesthetics to take effect. The procedure needles were then advanced to the target area. Proper needle placement secured. Negative aspiration confirmed. Solution injected in intermittent fashion,  asking for systemic symptoms every 0.5cc of injectate. The needles were then removed and the area cleansed, making sure to leave some of the prepping solution back to take advantage of its long term bactericidal properties.  Vitals:   11/09/20 0915 11/09/20 0925 11/09/20 0935 11/09/20 0946  BP: 103/79 114/60 (!) 116/59 118/62  Pulse: 71     Resp: 12 19 18 18   Temp:  (!) 97.5 F (36.4 C)  (!) 97.5 F (36.4 C)  TempSrc:      SpO2: 99% 100% 99% 99%  Weight:      Height:        Start Time: 0852 hrs. End Time: 0915 hrs.  Materials:  Needle(s) Type: Spinal Needle Gauge: 22G Length: 3.5-in Medication(s): Please see orders for medications and dosing details.  Imaging Guidance (Spinal) for procedure #1:  Type of Imaging Technique: Fluoroscopy Guidance (Spinal) Indication(s): Assistance in needle guidance and placement for procedures requiring needle placement in or near specific anatomical locations not easily accessible without such assistance. Exposure Time: Please see nurses notes. Contrast: Before injecting any contrast, we confirmed that the patient did not have an allergy to iodine, shellfish, or radiological contrast. Once satisfactory needle placement was completed at the desired level, radiological contrast was injected. Contrast injected under live fluoroscopy. No contrast complications. See chart for type and volume of contrast used. Fluoroscopic Guidance: I was personally present during the use of fluoroscopy. "Tunnel Vision Technique" used to obtain the best possible view of the target area. Parallax error corrected before commencing the procedure. "Direction-depth-direction" technique used to introduce the needle under continuous pulsed fluoroscopy. Once target was reached, antero-posterior, oblique, and lateral fluoroscopic projection used confirm needle placement in all planes. Images permanently stored in EMR. Interpretation: I personally interpreted the imaging  intraoperatively. Adequate needle placement confirmed in multiple planes. Appropriate spread of contrast into desired area was observed. No evidence of afferent or efferent intravascular uptake. No intrathecal or subarachnoid spread observed. Permanent images saved into the patient's record.  Description of Procedure #2:  Target Area: For Epidural Steroid injection(s), the target area is the  interlaminar space, initially targeting the lower border of the superior vertebral body lamina. Approach: Interlaminar approach. Area Prepped: Entire Posterior Thoracolumbar Region DuraPrep (Iodine Povacrylex [0.7% available iodine] and Isopropyl Alcohol, 74% w/w) Safety Precautions: Aspiration looking for blood return was conducted prior to all injections.  At no point did we inject any substances, as a needle was being advanced. No attempts were made at seeking any paresthesias. Safe injection practices and needle disposal techniques used. Medications properly checked for expiration dates. SDV (single dose vial) medications used. Description of the Procedure: Protocol guidelines were followed. The patient was placed in position over the fluoroscopy table. The target area was identified and the area prepped in the usual manner. Skin & deeper tissues infiltrated with local anesthetic. Appropriate amount of time allowed to pass for local anesthetics to take effect. The procedure needles were then advanced to the target area. The inferior aspect of the superior lamina was contacted and the needle walked caudad, until the lamina was cleared. The epidural space was identified using "loss-of-resistance technique" with 0.9% PF-NSS (2-94mL), in a low friction 10cc LOR glass syringe. Proper needle placement was secured. Negative aspiration confirmed. Solution injected in intermittent fashion, asking for systemic symptoms every 0.5 cc of injectate. The needles were then removed and the area cleansed, making sure to leave some of  the prepping solution behind to take advantage of its long term bactericidal properties. Vitals:   11/09/20 0915 11/09/20 0925 11/09/20 0935 11/09/20 0946  BP: 103/79 114/60 (!) 116/59 118/62  Pulse: 71     Resp: 12 19 18 18   Temp:  (!) 97.5 F (36.4 C)  (!) 97.5 F (36.4 C)  TempSrc:      SpO2: 99% 100% 99% 99%  Weight:      Height:        Start Time: 0852 hrs. End Time: 0915 hrs. Materials:  Needle(s) Type: Epidural needle Gauge: 17G Length: 3.5-in Medication(s): Please see orders for medications and dosing details.  Imaging Guidance (Spinal) for procedure #2:  Type of Imaging Technique: Fluoroscopy Guidance (Spinal) Indication(s): Assistance in needle guidance and placement for procedures requiring needle placement in or near specific anatomical locations not easily accessible without such assistance. Exposure Time: Please see nurses notes. Contrast: Before injecting any contrast, we confirmed that the patient did not have an allergy to iodine, shellfish, or radiological contrast. Once satisfactory needle placement was completed at the desired level, radiological contrast was injected. Contrast injected under live fluoroscopy. No contrast complications. See chart for type and volume of contrast used. Fluoroscopic Guidance: I was personally present during the use of fluoroscopy. "Tunnel Vision Technique" used to obtain the best possible view of the target area. Parallax error corrected before commencing the procedure. "Direction-depth-direction" technique used to introduce the needle under continuous pulsed fluoroscopy. Once target was reached, antero-posterior, oblique, and lateral fluoroscopic projection used confirm needle placement in all planes. Images permanently stored in EMR. Interpretation: I personally interpreted the imaging intraoperatively. Adequate needle placement confirmed in multiple planes. Appropriate spread of contrast into desired area was observed. No evidence of  afferent or efferent intravascular uptake. No intrathecal or subarachnoid spread observed. Permanent images saved into the patient's record.  Antibiotic Prophylaxis:   Anti-infectives (From admission, onward)   None     Indication(s): None identified  Post-operative Assessment:  Post-procedure Vital Signs:  Pulse/HCG Rate: 71 (Nsr)87 Temp: (!) 97.5 F (36.4 C) Resp: 18 BP: 118/62 SpO2: 99 %  EBL: None  Complications: No immediate post-treatment complications observed by team, or reported by patient.  Note: The patient tolerated the entire procedure well. A repeat set of vitals were taken after the procedure and the patient was kept under observation following institutional policy, for this type of procedure. Post-procedural neurological assessment was performed, showing return  to baseline, prior to discharge. The patient was provided with post-procedure discharge instructions, including a section on how to identify potential problems. Should any problems arise concerning this procedure, the patient was given instructions to immediately contact us, at any time, without hesitation. In any case, we plan to contact the patient by telephone for a follow-up status report regarding this interventional procedure.  Comments:  No additional relevant information.  Plan of Care  Orders:  Orders Placed This Encounter  Procedures  . Lumbar Transforaminal Epidural    Scheduling Instructions:     Side: Bilateral     Level: L2     Sedation: With Sedation.     Timeframe: Today    Order Specific Question:   Where will this procedure be performed?    Answer:   [facility]  . Thoracic Epidural Injection    Scheduling Instructions:     Side: Right-sided     Sedation: With Sedation.     Timeframe: Today    Order Specific Question:   Where will this procedure be performed?    Answer:   [facility]  . [facility] C-ARM 1-60 MIN NO REPORT    Intraoperative interpretation by  procedural physician at [place].    Standing Status:   Standing    Number of Occurrences:   1    Order Specific Question:   Reason for exam:    Answer:   Assistance in needle guidance and placement for procedures requiring needle placement in or near specific anatomical locations not easily accessible without such assistance.  . Informed Consent Details: Physician/Practitioner Attestation; Transcribe to consent form and obtain patient signature    Provider Attestation: I, [place] Dossie Arbour, MD, (Pain Management Specialist), the physician/practitioner, attest that I have discussed with the patient the benefits, risks, side effects, alternatives, likelihood of achieving goals and potential problems during recovery for the procedure that I have provided informed consent.    Scheduling Instructions:     Note: Always confirm laterality of pain with Ms. Broshears, before procedure.     Transcribe to consent form and obtain patient signature.    Order Specific Question:   Physician/Practitioner attestation of informed consent for procedure/surgical case    Answer:   I, the physician/practitioner, attest that I have discussed with the patient the benefits, risks, side effects, alternatives, likelihood of achieving goals and potential problems during recovery for the procedure that I have provided informed consent.    Order Specific Question:   Procedure    Answer:   Diagnostic lumbar transforaminal epidural steroid injection under fluoroscopic guidance. (See notes for level and laterality.)    Order Specific Question:   Physician/Practitioner performing the procedure    Answer:   Imari Reen A. Dossie Arbour, MD    Order Specific Question:   Indication/Reason    Answer:   Lumbar radiculopathy/radiculitis associated with lumbar stenosis  . Provide equipment / supplies at bedside    "Block Tray" (Disposable  single use) Needle type: SpinalSpinal Amount/quantity: 2 Size: Medium (5-inch) Gauge: 22G     Standing Status:   Standing    Number of Occurrences:   1    Order Specific Question:   Specify    Answer:   Block Tray  . Informed Consent Details: Physician/Practitioner Attestation; Transcribe to consent form and obtain patient signature    Provider Attestation: I, [place] Dossie Arbour, MD, (Pain Management Specialist), the physician/practitioner, attest that I have discussed with the patient the benefits, risks,  side effects, alternatives, likelihood of achieving goals and potential problems during recovery for the procedure that I have provided informed consent.    Scheduling Instructions:     Nursing Order: Transcribe to consent form and obtain patient signature.     Note: Always confirm laterality of pain with Ms. Wroe, before procedure.    Order Specific Question:   Physician/Practitioner attestation of informed consent for procedure/surgical case    Answer:   I, the physician/practitioner, attest that I have discussed with the patient the benefits, risks, side effects, alternatives, likelihood of achieving goals and potential problems during recovery for the procedure that I have provided informed consent.    Order Specific Question:   Procedure    Answer:   Thoracic Epidural Steroid Injection/Block under fluoroscopic guidance    Order Specific Question:   Physician/Practitioner performing the procedure    Answer:   Yuniel Blaney A. Dossie Arbour MD    Order Specific Question:   Indication/Reason    Answer:   Upper (Thoracic) Back Pain with or without Thoracic Radicular Pain (Rib/Flank pain) secondary to Thoracic Degenerative Disc Disease and/or Thoracic Intervertebral Disc Displacement, with or without Thoracic Radiculitis/Radiculopathy.  . Provide equipment / supplies at bedside    "Epidural Tray" (Disposable  single use) Catheter: NOT required    Standing Status:   Standing    Number of Occurrences:   1    Order Specific Question:   Specify    Answer:   Epidural Tray  . Miscellanous  precautions    Standing Status:   Standing    Number of Occurrences:   1  . Latex precautions    Activate Latex-Free Protocol.    Standing Status:   Standing    Number of Occurrences:   1   Chronic Opioid Analgesic:  Hydrocodone/APAP 7.5/325, 1 tab PO q 6 hrs. she appears to be using less than 10 pills/month. MME/day:3.75mg /day.   Medications ordered for procedure: Meds ordered this encounter  Medications  . glycopyrrolate (ROBINUL) injection 0.2 mg  . lidocaine (XYLOCAINE) 2 % (with pres) injection 400 mg  . lactated ringers infusion 1,000 mL  . midazolam (VERSED) 5 MG/5ML injection 1-2 mg    Make sure Flumazenil is available in the pyxis when using this medication. If oversedation occurs, administer 0.2 mg IV over 15 sec. If after 45 sec no response, administer 0.2 mg again over 1 min; may repeat at 1 min intervals; not to exceed 4 doses (1 mg)  . fentaNYL (SUBLIMAZE) injection 25-50 mcg    Make sure Narcan is available in the pyxis when using this medication. In the event of respiratory depression (RR< 8/min): Titrate NARCAN (naloxone) in increments of 0.1 to 0.2 mg IV at 2-3 minute intervals, until desired degree of reversal.  . sodium chloride flush (NS) 0.9 % injection 2 mL  . ropivacaine (PF) 2 mg/mL (0.2%) (NAROPIN) injection 2 mL  . dexamethasone (DECADRON) injection 20 mg  . sodium chloride flush (NS) 0.9 % injection 2 mL  . ropivacaine (PF) 2 mg/mL (0.2%) (NAROPIN) injection 2 mL  . dexamethasone (DECADRON) injection 10 mg  . triamcinolone acetonide (KENALOG-40) injection 40 mg   Medications administered: We administered glycopyrrolate, lidocaine, lactated ringers, midazolam, fentaNYL, sodium chloride flush, ropivacaine (PF) 2 mg/mL (0.2%), dexamethasone, sodium chloride flush, ropivacaine (PF) 2 mg/mL (0.2%), dexamethasone, and triamcinolone acetonide.  See the medical record for exact dosing, route, and time of administration.  Follow-up plan:   Return in about 2  weeks (around 11/23/2020) for (F2F), (  PP) Follow-up.       Interventional treatment options:  Under consideration:  Neurosurgical referral for decompressive laminectomy and/or discectomy   Therapeutic/palliative (PRN): Diagnostic left SI joint block #2  Diagnostic/therapeutic left IA hip joint injection #2  Diagnostic bilateral lumbar facet block #2  Palliative left trapezius muscle MNB #4 Palliative right trapezius muscle MNB #2 Palliative left L1-2 LESI #2 Palliative right L1-2 LESI #2 Palliativeleft CESI #3 Palliative left greater occipital NB #3  Palliative left C2 + TON NB #2  Palliative left GON + C2 + TON RFA #2 (last done 12/25/2017) Diagnostic/therapeutic bilateral L2 TFESI #2 (done on 03/07/2020) (100/100/100/>50)     Recent Visits Date Type Provider Dept  11/08/20 Office Visit Milinda Pointer, MD [facility]  Showing recent visits within past 90 days and meeting all other requirements Today's Visits Date Type Provider Dept  11/09/20 Procedure visit Milinda Pointer, MD [facility]  Showing today's visits and meeting all other requirements Future Appointments Date Type Provider Dept  11/23/20 Appointment Milinda Pointer, MD [facility]  Showing future appointments within next 90 days and meeting all other requirements  Disposition: Discharge home  Discharge (Date  Time): 11/09/2020; 0950 hrs.   Primary Care Physician: Dana Grana, PA-C Location: [facility] Note by: Dana Cola, MD Date: 11/09/2020; Time: 11:14 AM  Disclaimer:  Medicine is not an Chief Strategy Officer. The only guarantee in medicine is that nothing is guaranteed. It is important to note that the decision to proceed with this intervention was based on the information collected from the patient. The Data and conclusions were drawn from the patient's questionnaire, the interview, and the physical examination. Because the  information was provided in large part by the patient, it cannot be guaranteed that it has not been purposely or unconsciously manipulated. Every effort has been made to obtain as much relevant data as possible for this evaluation. It is important to note that the conclusions that lead to this procedure are derived in large part from the available data. Always take into account that the treatment will also be dependent on availability of resources and existing treatment guidelines, considered by other Pain Management Practitioners as being common knowledge and practice, at the time of the intervention. For Medico-Legal purposes, it is also important to point out that variation in procedural techniques and pharmacological choices are the acceptable norm. The indications, contraindications, technique, and results of the above procedure should only be interpreted and judged by a Board-Certified Interventional Pain Specialist with extensive familiarity and expertise in the same exact procedure and technique.

## 2020-11-09 NOTE — Progress Notes (Signed)
Safety precautions to be maintained throughout the outpatient stay will include: orient to surroundings, keep bed in low position, maintain call bell within reach at all times, provide assistance with transfer out of bed and ambulation.  

## 2020-11-09 NOTE — Patient Instructions (Signed)

## 2020-11-10 ENCOUNTER — Telehealth: Payer: Self-pay | Admitting: *Deleted

## 2020-11-10 NOTE — Telephone Encounter (Signed)
No problems post procedure. 

## 2020-11-13 ENCOUNTER — Other Ambulatory Visit: Payer: Self-pay

## 2020-11-13 ENCOUNTER — Ambulatory Visit (INDEPENDENT_AMBULATORY_CARE_PROVIDER_SITE_OTHER): Payer: Medicaid Other | Admitting: Obstetrics and Gynecology

## 2020-11-13 ENCOUNTER — Encounter: Payer: Self-pay | Admitting: Obstetrics and Gynecology

## 2020-11-13 ENCOUNTER — Other Ambulatory Visit (HOSPITAL_COMMUNITY)
Admission: RE | Admit: 2020-11-13 | Discharge: 2020-11-13 | Disposition: A | Discharge status: Home / Self Care | Payer: Medicaid Other | Source: Ambulatory Visit | Attending: Obstetrics and Gynecology | Admitting: Obstetrics and Gynecology

## 2020-11-13 VITALS — BP 112/68 | Ht 63.0 in | Wt 233.0 lb

## 2020-11-13 DIAGNOSIS — Z1151 Encounter for screening for human papillomavirus (HPV): Secondary | ICD-10-CM | POA: Diagnosis not present

## 2020-11-13 DIAGNOSIS — Z113 Encounter for screening for infections with a predominantly sexual mode of transmission: Secondary | ICD-10-CM

## 2020-11-13 DIAGNOSIS — R102 Pelvic and perineal pain: Secondary | ICD-10-CM | POA: Diagnosis not present

## 2020-11-13 DIAGNOSIS — Z01419 Encounter for gynecological examination (general) (routine) without abnormal findings: Secondary | ICD-10-CM | POA: Insufficient documentation

## 2020-11-13 DIAGNOSIS — Z124 Encounter for screening for malignant neoplasm of cervix: Secondary | ICD-10-CM

## 2020-11-13 DIAGNOSIS — G8929 Other chronic pain: Secondary | ICD-10-CM | POA: Diagnosis not present

## 2020-11-13 MED ORDER — NORETHINDRONE ACETATE 5 MG PO TABS: 5.0000 mg | ORAL_TABLET | Freq: Every day | ORAL | 11 refills | Status: DC
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
## 2020-11-16 LAB — CYTOLOGY - PAP
Chlamydia: NEGATIVE
Comment: NEGATIVE
Comment: NEGATIVE
Comment: NORMAL
Diagnosis: NEGATIVE
High risk HPV: NEGATIVE
Neisseria Gonorrhea: NEGATIVE

## 2020-11-23 ENCOUNTER — Other Ambulatory Visit: Payer: Self-pay | Admitting: Obstetrics and Gynecology

## 2020-11-23 ENCOUNTER — Ambulatory Visit: Payer: Medicaid Other | Admitting: Pain Medicine

## 2020-11-23 ENCOUNTER — Other Ambulatory Visit: Payer: Self-pay

## 2020-11-23 ENCOUNTER — Telehealth: Payer: Self-pay

## 2020-11-23 MED ORDER — NORETHINDRONE ACETATE 5 MG PO TABS: 5.0000 mg | ORAL_TABLET | Freq: Every day | ORAL | 11 refills | Status: DC

## 2020-11-23 MED ORDER — MEDROXYPROGESTERONE ACETATE 5 MG PO TABS: 5.0000 mg | ORAL_TABLET | Freq: Every day | ORAL | 11 refills | Status: DC

## 2020-11-23 NOTE — Telephone Encounter (Signed)
Pt reported her BC was not at her pharmacy. I called Medication management and they stated they do not provide [facility] so Pt request to send it to [facility]

## 2020-11-27 ENCOUNTER — Other Ambulatory Visit: Payer: Self-pay

## 2020-11-27 ENCOUNTER — Encounter: Payer: Self-pay | Attending: Physical Medicine and Rehabilitation | Admitting: Physical Medicine and Rehabilitation

## 2020-11-27 ENCOUNTER — Encounter: Payer: Self-pay | Admitting: Physical Medicine and Rehabilitation

## 2020-11-27 VITALS — BP 129/80 | HR 91 | Temp 98.6°F | Ht 63.0 in | Wt 225.6 lb

## 2020-11-27 DIAGNOSIS — G43001 Migraine without aura, not intractable, with status migrainosus: Secondary | ICD-10-CM

## 2020-11-27 DIAGNOSIS — M7918 Myalgia, other site: Secondary | ICD-10-CM

## 2020-11-27 DIAGNOSIS — H8110 Benign paroxysmal vertigo, unspecified ear: Secondary | ICD-10-CM

## 2020-11-27 MED ORDER — ZIKS ARTHRITIS PAIN RELIEF 0.025-1-12 % EX CREA: 1.0000 "application " | TOPICAL_CREAM | Freq: Four times a day (QID) | CUTANEOUS | 0 refills | Status: DC | PRN

## 2020-11-27 MED ORDER — SODIUM CHLORIDE 0.9 % IV SOLN: 100.0000 mg | INTRAVENOUS | 3 refills | Status: DC

## 2020-11-27 NOTE — Progress Notes (Signed)
Subjective:    Patient ID: Dana Bradley, female    DOB: [birth year], 50 y.o.   MRN: [id number]  HPI   1) Chronic Migraine:  -Dana Bradley a 50 year old woman who presents for follow-up of her chronic migraine.  Terrible stomach spasms and diarrhea. 12 hours later debilitating migraine. It usually starts in the afternoon. She feels that this may be part of her prodrome. She also has terrible vertigo that is getting worse. The Fiorcet helps a lot, the Migronal helps with the frontal headache. These make the pain bearable.   2) Vertigo -Symptoms are worsening.  -She feels that this may be leading up to the vertigo.  -She is interested in vestibular therapy.   3) Cervical myofasical pain syndrome: -Muscles of neck are very tight, pain is worse on the left side.  -She found trigger point injections   Dana Bradley is a 50 year old woman who presents for follow-up of her chronic migraine. Her average pain is 6/10 and pain right now is 6/10. Her pain is intermittent, sharp, burning, stabbing, tingling, and aching.   She recently had 7 days without migraine. She is not sure what was associated with this. She had a pretty terrible bounceback.  She picked up the Migranal nasal spray. Our CMA explained how it works.   Her pain doctor is going to stop prescribing the Lyrica and she needs to find a new doctor to prescribe this.    Pain Inventory Average Pain 6 Pain Right Now 5 My pain is intermittent, sharp, burning, stabbing, tingling and aching  In the last 24 hours, has pain interfered with the following? General activity 6 Relation with others 8 Enjoyment of life 8 What TIME of day is your pain at its worst? evening and night Sleep (in general) Poor  Pain is worse with: bending and some activites Pain improves with: rest, heat/ice, medication and TENS  Family History  Problem Relation Age of Onset  . Depression Mother   . Hypertension Mother   . Cancer Mother        Skin  .  Hyperlipidemia Mother   . Anxiety disorder Mother   . Migraines Mother   . Alcohol abuse Father   . Depression Father   . Stroke Father   . Heart disease Father   . Hypertension Father   . Anxiety disorder Father   . Depression Sister   . Hyperlipidemia Sister   . Diabetes Sister   . Hypertension Sister   . Polycystic ovary syndrome Sister   . Bipolar disorder Sister   . Anxiety disorder Sister   . Migraines Sister   . Cancer Maternal Grandmother 54       Breast  . Thyroid disease Maternal Grandmother   . Arthritis Maternal Grandmother   . Hyperlipidemia Maternal Grandmother   . Depression Sister   . Hypertension Sister   . Anxiety disorder Sister   . Migraines Sister   . Alzheimer's disease Other   . Aneurysm Maternal Grandfather   . Hypertension Maternal Grandfather   . Heart disease Maternal Grandfather   . Alzheimer's disease Paternal Grandmother   . Heart attack Paternal Grandfather   . Hypertension Paternal Grandfather   . COPD Paternal Grandfather   . Heart disease Paternal Grandfather   . Migraines Son   . Migraines Daughter   . Migraines Daughter   . Bladder Cancer Neg Hx   . Kidney cancer Neg Hx    Social History  Socioeconomic History  . Marital status: Married    Spouse name: brian  . Number of children: 3  . Years of education: Not on file  . Highest education level: Associate degree: academic program  Occupational History  . Occupation: disbled    Comment: not able  Tobacco Use  . Smoking status: Former Smoker    Packs/day: 4.00    Years: 3.00    Pack years: 12.00    Types: Cigarettes    Quit date: 12/10/1992    Years since quitting: 27.9  . Smokeless tobacco: Never Used  . Tobacco comment: quit 25 years ago  Vaping Use  . Vaping Use: Never used  Substance and Sexual Activity  . Alcohol use: No    Alcohol/week: 0.0 standard drinks  . Drug use: No  . Sexual activity: Not Currently    Partners: Male    Birth control/protection:  Surgical  Other Topics Concern  . Not on file  Social History Narrative   Lives at home with her husband and 2 of her children   Right handed   Caffeine: 0-2 cups daily   Social Determinants of Health   Financial Resource Strain: Not on file  Food Insecurity: Not on file  Transportation Needs: Not on file  Physical Activity: Not on file  Stress: Not on file  Social Connections: Not on file   Past Surgical History:  Procedure Laterality Date  . ABLATION     Uterine  . CARDIAC CATHETERIZATION     with ablation  . COLONOSCOPY WITH PROPOFOL N/A 05/17/2015   Procedure: COLONOSCOPY WITH PROPOFOL;  Surgeon: Manya Silvas, MD;  Location: [facility] ENDOSCOPY;  Service: Endoscopy;  Laterality: N/A;  . COLONOSCOPY WITH PROPOFOL N/A 03/20/2020   Procedure: COLONOSCOPY WITH PROPOFOL;  Surgeon: Jonathon Bellows, MD;  Location: [facility] ENDOSCOPY;  Service: Gastroenterology;  Laterality: N/A;  . ESOPHAGOGASTRODUODENOSCOPY N/A 05/17/2015   Procedure: ESOPHAGOGASTRODUODENOSCOPY (EGD);  Surgeon: Manya Silvas, MD;  Location: [facility] ENDOSCOPY;  Service: Endoscopy;  Laterality: N/A;  . KNEE ARTHROSCOPY    . spg     6/18  . Tibial Tubercle Bypass Right 1998  . TUBAL LIGATION  10/01/99   Past Surgical History:  Procedure Laterality Date  . ABLATION     Uterine  . CARDIAC CATHETERIZATION     with ablation  . COLONOSCOPY WITH PROPOFOL N/A 05/17/2015   Procedure: COLONOSCOPY WITH PROPOFOL;  Surgeon: Manya Silvas, MD;  Location: [facility] ENDOSCOPY;  Service: Endoscopy;  Laterality: N/A;  . COLONOSCOPY WITH PROPOFOL N/A 03/20/2020   Procedure: COLONOSCOPY WITH PROPOFOL;  Surgeon: Jonathon Bellows, MD;  Location: [facility] ENDOSCOPY;  Service: Gastroenterology;  Laterality: N/A;  . ESOPHAGOGASTRODUODENOSCOPY N/A 05/17/2015   Procedure: ESOPHAGOGASTRODUODENOSCOPY (EGD);  Surgeon: Manya Silvas, MD;  Location: [facility] ENDOSCOPY;  Service: Endoscopy;  Laterality: N/A;  . KNEE ARTHROSCOPY    . spg     6/18  . Tibial  Tubercle Bypass Right 1998  . TUBAL LIGATION  10/01/99   Past Medical History:  Diagnosis Date  . Acute postoperative pain 04/07/2017  . Anxiety   . Bursitis   . Chronic fatigue 12/12/2017  . Chronic fatigue syndrome   . Colitis 2021  . Edema leg 05/02/2015  . Fibromyalgia   . GERD (gastroesophageal reflux disease)   . IBS (irritable bowel syndrome)   . Knee pain, bilateral 12/21/2008   Qualifier: Diagnosis of  By: Hassell Done FNP, Tori Milks    . Lumbar discitis   . Migraines   . Osteoarthritis   .  Right hand pain 04/10/2015   [facility] has done nerve conduction studies and ruled out carpal tunnel.   . Sleep apnea   . Spinal stenosis   . SVT (supraventricular tachycardia) ([place])   . Vertigo   . Vitamin D deficiency 05/01/2016   BP 129/80   Pulse 91   Temp 98.6 F (37 C)   Ht 5\' 3"  (1.6 m)   Wt 225 lb 9.6 oz (102.3 kg)   LMP 11/09/2020   SpO2 96%   BMI 39.96 kg/m   Opioid Risk Score:   Fall Risk Score:  `1  Depression screen PHQ 2/9  Depression screen [facility] 2/9 07/03/2020 05/24/2020 04/12/2020 03/13/2020 03/01/2020 01/24/2020 09/28/2019  Decreased Interest 1 0 0 1 0 0 0  Down, Depressed, Hopeless 1 0 0 0 0 0 0  PHQ - 2 Score 2 0 0 1 0 0 0  Altered sleeping 3 - - - - 0 -  Tired, decreased energy 3 - - - - 0 -  Change in appetite 2 - - - - 0 -  Feeling bad or failure about yourself  1 - - - - 0 -  Trouble concentrating 2 - - - - 0 -  Moving slowly or fidgety/restless 2 - - - - 0 -  Suicidal thoughts 0 - - - - 0 -  PHQ-9 Score - - - - - 0 -  Difficult doing work/chores Extremely dIfficult - - - - Not difficult at all -  Some recent data might be hidden   Review of Systems     Objective:   Physical Exam  Gen: no distress, normal appearing HEENT: oral mucosa pink and moist, NCAT Cardio: Reg rate Chest: normal effort, normal rate of breathing Abd: soft, non-distended Ext: no edema Psych: pleasant, normal affect Skin: intact Neuro: Alert and oriented  x3 Musculoskeletal: Severe tightness in her left upper back/neck muscles.      Assessment & Plan:  Dana Bradley is a 50 year old woman with chronic intractable migraines s/p numerous treatments, severe fibromyalgia, and IBS, and nausea, and vertigo.    Start Physical Therapy for vertigo: In the future, restart for cervical myofascial pain syndrome: myofascial release, postural correction, stretching and strengthening of the muscles of the neck and upper back, development of HEP. Continue heating pads to muscles of upper back and neck. She is doing HEP.   Exercise: Discussed that exercise is one of the most effective treatments for fibromyalgia. This will also help with her obesity. Made goal with Dana Bradley to walk outside her home at least once per day, and to garden at least once per day (her favorite activity). Can use elliptical which she has at home on rainy days. The heat has been oppressive and so she has been trying to do the latter more.   Quality of Life: Discussed that Dana Bradley's greatest source of happiness is her family. This community will be essential in helping her recover from her chronic pain and to increase her daily activity.Her daughter is also suffering from similar migraines unfortunately.  -Continue ginger, herbs, turmeric, blueberries, eating real food. Continue cutting down sugar. Using local honey is a great alternative.  Medications: Continue Baclofen for pain relief. Minimize use of Hydrocodone. She is only taking Lyrica once per day to make it last. Continue Migranal which is helping.  Migraines: Continue Fioricet, which is one of the medications that helps her. She takes this at least once every time she gets a migraine.  Advised to use upon migraine onset and to not use more frequently than q6H during migraine. Refilled Zofran for nausea last week, and this has been helping her. Continue metoprolol which can be helpful in migraine prophylaxis (HR is well  controlled). Prescribed ergot nasal spray to try upon migraine initiation- advised no more frequently than 4 sprays per hour (still awaiting on prior auth). Discussed avoiding foods that may trigger migraines.  -Discussed that I can provide refill for her Lyrica when she needs it.  -When she gets the migraines her loss of words is getting worse.  -Ordered Vyepti- she will find out cost from her pharmacy.   Cervical facet arthrosis: Cervical XR normal- discussed results with patient. Pain is worse on left side.   Stress: -Her daughter was recently diagnosed with sarcoma and this is a great source of stress and fear to Dana Bradley. It has been a turbulent time for her and this has understandably worsened her symptoms. Discussed benefits of gratitude journaling and she plans to try this.    All questions were encouraged and answered. Follow up with mein 2 months.

## 2020-12-01 ENCOUNTER — Ambulatory Visit: Payer: Medicaid Other | Admitting: Obstetrics and Gynecology

## 2020-12-01 ENCOUNTER — Ambulatory Visit: Payer: Medicaid Other

## 2020-12-01 DIAGNOSIS — G8929 Other chronic pain: Secondary | ICD-10-CM

## 2020-12-04 ENCOUNTER — Ambulatory Visit: Payer: Self-pay | Attending: Physical Medicine and Rehabilitation

## 2020-12-04 ENCOUNTER — Other Ambulatory Visit: Payer: Self-pay

## 2020-12-04 DIAGNOSIS — M542 Cervicalgia: Secondary | ICD-10-CM | POA: Insufficient documentation

## 2020-12-04 DIAGNOSIS — M5442 Lumbago with sciatica, left side: Secondary | ICD-10-CM | POA: Insufficient documentation

## 2020-12-04 DIAGNOSIS — R252 Cramp and spasm: Secondary | ICD-10-CM | POA: Insufficient documentation

## 2020-12-04 DIAGNOSIS — G8929 Other chronic pain: Secondary | ICD-10-CM | POA: Insufficient documentation

## 2020-12-04 DIAGNOSIS — M5441 Lumbago with sciatica, right side: Secondary | ICD-10-CM | POA: Insufficient documentation

## 2020-12-04 DIAGNOSIS — M6281 Muscle weakness (generalized): Secondary | ICD-10-CM | POA: Insufficient documentation

## 2020-12-05 NOTE — Therapy (Signed)
[facility] [address], [place], [address] Phone: [phone]   Fax:  [phone]  Physical Therapy Evaluation  Patient Details  Name: Dana Bradley MRN: [id number] Date of Birth: June 21, 1971 Referring Provider (PT): Delsa Grana PA   Encounter Date: 12/04/2020   PT End of Session - 12/05/20 1259    Visit Number 1    Number of Visits 17    Date for PT Re-Evaluation 02/27/21    PT Start Time 1430    PT Stop Time 1515    PT Time Calculation (min) 45 min    Equipment Utilized During Treatment --    Activity Tolerance Patient tolerated treatment well    Behavior During Therapy [facility] for tasks assessed/performed           Past Medical History:  Diagnosis Date  . Acute postoperative pain 04/07/2017  . Anxiety   . Bursitis   . Chronic fatigue 12/12/2017  . Chronic fatigue syndrome   . Colitis 2021  . Edema leg 05/02/2015  . Fibromyalgia   . GERD (gastroesophageal reflux disease)   . IBS (irritable bowel syndrome)   . Knee pain, bilateral 12/21/2008   Qualifier: Diagnosis of  By: Hassell Done FNP, Tori Milks    . Lumbar discitis   . Migraines   . Osteoarthritis   . Right hand pain 04/10/2015   [facility] has done nerve conduction studies and ruled out carpal tunnel.   . Sleep apnea   . Spinal stenosis   . SVT (supraventricular tachycardia) ([place])   . Vertigo   . Vitamin D deficiency 05/01/2016    Past Surgical History:  Procedure Laterality Date  . ABLATION     Uterine  . CARDIAC CATHETERIZATION     with ablation  . COLONOSCOPY WITH PROPOFOL N/A 05/17/2015   Procedure: COLONOSCOPY WITH PROPOFOL;  Surgeon: Manya Silvas, MD;  Location: [facility] ENDOSCOPY;  Service: Endoscopy;  Laterality: N/A;  . COLONOSCOPY WITH PROPOFOL N/A 03/20/2020   Procedure: COLONOSCOPY WITH PROPOFOL;  Surgeon: Jonathon Bellows, MD;  Location: [facility] ENDOSCOPY;  Service: Gastroenterology;  Laterality: N/A;  . ESOPHAGOGASTRODUODENOSCOPY N/A  05/17/2015   Procedure: ESOPHAGOGASTRODUODENOSCOPY (EGD);  Surgeon: Manya Silvas, MD;  Location: [facility] ENDOSCOPY;  Service: Endoscopy;  Laterality: N/A;  . KNEE ARTHROSCOPY    . spg     6/18  . Tibial Tubercle Bypass Right 1998  . TUBAL LIGATION  10/01/99    There were no vitals filed for this visit.    Subjective Assessment - 12/05/20 1250    Subjective Patient is a 50 yo right hand dominant female presenting with increased cervical pain and occurance of migranes at a rate of 1 major migrane which lasts for 24hours with increased light sensitivity and inability to move without increase in pain. Patient states she also experiences 3 minor episodes of headaches which last on avergae for 12 hours, however, she continues to be able to perform activities around the home. Patient states symptoms have been worse since the cancer dx of her daughter. Patient reports she would like to review exercises performed during her previous sessions in physical therapy in order to continue benefits at home. Patient states her pain is worse thoruhgout the day and can at times head her symptoms by performing several exercises.    Pertinent History Fibromyalgia, chronic LBP, neck pain, hx of migranes    Limitations Walking;Lifting    Diagnostic tests None for cervical recently    Patient Stated Goals  To decrease migranes    Currently in Pain? Yes    Pain Score 6     Pain Location Neck    Pain Orientation Lower    Pain Descriptors / Indicators Aching    Pain Type Chronic pain    Pain Onset More than a month ago    Pain Frequency Constant              [facility] PT Assessment - 12/05/20 1529      Assessment   Medical Diagnosis chronic migranes and neck pain    Referring Provider (PT) Delsa Grana PA    Onset Date/Surgical Date 11/29/10    Hand Dominance Right    Next MD Visit unknown    Prior Therapy yes      Restrictions   Weight Bearing Restrictions No      Balance Screen   Has the patient  fallen in the past 6 months No    Has the patient had a decrease in activity level because of a fear of falling?  Yes    Is the patient reluctant to leave their home because of a fear of falling?  No      Home Ecologist residence      Prior Function   Level of Independence Independent    Vocation Unemployed    Vocation Requirements N/A    Leisure Gardening      Cognition   Overall Cognitive Status Within Functional Limits for tasks assessed      Observation/Other Assessments   Observations Protuberance along the C7-T1 junction      Sensation   Light Touch Appears Intact      Posture/Postural Control   Posture Comments FHP, forward rounded shoulders      ROM / Strength   AROM / PROM / Strength AROM;Strength      AROM   Overall AROM Comments Shoulder AROM: WNL - no increase in pain, cervical retraction: increase pain 50% limited    AROM Assessment Site Shoulder;Cervical    Cervical Flexion WNL    Cervical Extension 20% limited   pain   Cervical - Right Side Bend 10% limited   pain   Cervical - Left Side Bend 20% limited    Cervical - Right Rotation 20% limited    Cervical - Left Rotation 10% limited   pain     Strength   Strength Assessment Site Cervical    Cervical Flexion 4/5    Cervical Extension 3+/5   limited by pain   Cervical - Right Side Bend 4/5    Cervical - Left Side Bend 4/5      Palpation   Palpation comment TTP along UT, cervical extensors, and levator scap B      Special Tests    Special Tests Cervical    Cervical Tests other   Compression:     other    Findings Positive    Comment Increased pain B               Objective measurements completed on examination: See above findings.     TREATMENT Therapeutic Exercise Isometric cervical retraction - x 10 Isometric cervical side bending - x 10 SNAGS cervical rotation - x 10 SNAGS cervical extension- x 10 Scapular retraction in sitting- x 10 Scapular  depression in sitting - x 10  Performed exercises to decrease pain and spasms on the affected side --    PT Education - 12/05/20 1258  Education Details form/technique with exercise; HEP: added and reviewed exercises performed    Person(s) Educated Patient    Methods Explanation;Demonstration;Handout;Verbal cues;Tactile cues    Comprehension Verbalized understanding;Returned demonstration            PT Short Term Goals - 12/05/20 1522      PT SHORT TERM GOAL #1   Title Patient will be independent with HEP as adjunct to clinical therapy and to reduce total number of visits    Baseline HEP given; 12/06/2020    Time 4    Period Weeks    Status New    Target Date 02/27/21             PT Long Term Goals - 12/05/20 1523      PT LONG TERM GOAL #1   Title Patient will improve her FOTO score by a significant amount as indicated by the FOTO program to indicate improvement in migrane symptoms    Baseline FOTO:    Time 8    Period Weeks    Status New      PT LONG TERM GOAL #2   Title Patient will have a total of <2 migranes per week to indicate improvement in symptoms and allow for better performance of iADLs.    Baseline 4 migranes per week    Time 8    Period Weeks    Status New      PT LONG TERM GOAL #3   Title Patient will have a worst pain of 6/10 along the cervical spine and base of the skull to indicate significant improvement in symptoms.    Baseline 9/10    Time 8    Period Weeks    Status New                  Plan - 12/05/20 1304    Clinical Impression Statement Patient is a 50 yo right hand dominant female presenting with increased pain along her cervical spine and an increased occurances of migranes. Patient demonstrates cervical dysfunction as noted by decreased AROM with cervical retraction and TTP along the suboccipital musculature, cervical extensors, and UT specifically. Patient demonstrates poor strength and pain with cervical movements, but most  notably with R sided rotation and L sided side bending. Patient will benefit from further skilled therapy to return to prior level of function.    Personal Factors and Comorbidities Comorbidity 3+    Comorbidities Fibromyalgia, OA, Chronic migranes, neck pain, shoulder pain, ext.    Examination-Activity Limitations Lift    Examination-Participation Restrictions Community Activity;Cleaning;Volunteer    Stability/Clinical Decision Making Unstable/Unpredictable    Clinical Decision Making High    Rehab Potential Fair    PT Frequency 2x / week    PT Duration 8 weeks    PT Treatment/Interventions Electrical Stimulation;Moist Heat;Traction;Cryotherapy;Iontophoresis 4mg /ml Dexamethasone;Therapeutic activities;Therapeutic exercise;Balance training;Neuromuscular re-education;Functional mobility training;Patient/family education;Manual techniques;Dry needling;Passive range of motion;Taping;Joint Manipulations;Spinal Manipulations    PT Next Visit Plan Progress strengthening and AROM Ther ex    PT Home Exercise Plan See education sections    Consulted and Agree with Plan of Care Patient           Patient will benefit from skilled therapeutic intervention in order to improve the following deficits and impairments:  Pain,Decreased coordination,Increased muscle spasms,Postural dysfunction,Decreased endurance,Decreased range of motion,Decreased strength,Hypomobility,Impaired UE functional use,Decreased activity tolerance  Visit Diagnosis: Cervicalgia  Cramp and spasm     Problem List Patient Active Problem List   Diagnosis Date Noted  .  Abnormal MRI, lumbar spine (03/16/2020) 11/09/2020  . Chronic midline thoracic back pain 11/09/2020  . Abnormal MRI, thoracic spine (08/02/2019) 11/09/2020  . Thoracic spinal stenosis (T7-8) 11/09/2020  . Prolapse of thoracic disc with radiculopathy (T7-8) 11/09/2020  . Spasm of muscle of lower back 11/09/2020  . Vertigo, benign paroxysmal, unspecified  laterality 11/09/2020  . Uncomplicated opioid dependence ([place]) 11/08/2020  . Hyperalgesia 03/07/2020  . Neurogenic urinary incontinence 03/01/2020  . Other intervertebral disc degeneration, lumbar region 03/01/2020  . Spinal stenosis of lumbosacral region 03/01/2020  . Pharmacologic therapy 02/06/2020  . Lumbar radiculitis (Right) 09/21/2019  . Chronic lower extremity pain (Bilateral) 09/06/2019  . Intractable migraine with aura without status migrainosus 08/10/2019  . Other specified dorsopathies, sacral and sacrococcygeal region 08/03/2019  . Latex precautions, history of latex allergy 08/03/2019  . History of allergy to radiographic contrast media 08/03/2019  . DDD (degenerative disc disease), cervical 07/21/2019  . Cervical facet syndrome (Bilateral) (L>R) 07/21/2019  . DDD (degenerative disc disease), thoracic 07/21/2019  . Osteoarthritis of hip (Left) 07/21/2019  . Chronic groin pain (Bilateral) (L>R) 07/21/2019  . Chronic hip pain (Bilateral) (L>R) 07/21/2019  . Somatic dysfunction of sacroiliac joint (Bilateral) 07/21/2019  . Migraine with aura and with status migrainosus, not intractable 04/06/2019  . Cervico-occipital neuralgia (Left) 04/06/2019  . Weakness of leg (Left) 04/05/2019  . Difficulty walking 04/05/2019  . Chronic migraine without aura, with intractable migraine, so stated, with status migrainosus 12/27/2018  . Malar rash 09/04/2018  . Trigger point of neck (Left) 03/19/2018  . Occipital headache 12/25/2017  . Chronic fatigue syndrome with fibromyalgia 12/12/2017  . Trigger point of shoulder region (Left) 11/17/2017  . Myofascial pain syndrome (Left) (trapezius muscle) 07/22/2017  . Lumbar L1-2 disc protrusion (Right) 04/07/2017  . Muscle spasticity 04/01/2017  . Osteoarthritis of shoulder (Bilateral) 04/01/2017  . Lumbar spondylosis 01/06/2017  . Chronic hip pain (Left) 12/24/2016  . Chronic sacroiliac joint pain (Left) 12/24/2016  . Lumbar facet syndrome  (Bilateral) (L>R) 12/24/2016  . Lumbar radiculitis (Left) 12/24/2016  . Hypertriglyceridemia 11/27/2016  . History of vasovagal episode 10/30/2016  . Cervicogenic headache 09/09/2016  . Medication monitoring encounter 08/29/2016  . Controlled substance agreement signed 08/28/2016  . Plantar fasciitis of left foot 08/28/2016  . Vitamin B12 deficiency 08/28/2016  . Hyperlipidemia 08/28/2016  . Nephrolithiasis 08/12/2016  . Chronic pain syndrome 08/07/2016  . Long term prescription opiate use 08/07/2016  . Opiate use 08/07/2016  . Long term prescription benzodiazepine use 08/07/2016  . Neurogenic pain 08/07/2016  . Chronic low back pain (1ry area of Pain) (Bilateral) (R>L) (midline) 08/07/2016  . Chronic upper back pain (2ry area of Pain) (Bilateral) (L>R) 08/07/2016  . Chronic abdominal pain (Right lower quadrant) 08/07/2016  . Thoracic radiculitis (Bilateral: T10, T11) 08/07/2016  . Chronic occipital neuralgia (3ry area of Pain) (Bilateral) (L>R) 08/07/2016  . Chronic neck pain 08/07/2016  . Chronic cervical radicular pain (Bilateral) (L>R) 08/07/2016  . Chronic shoulder blade pain (Bilateral) (L>R) 08/07/2016  . Chronic upper extremity pain (Bilateral) (R>L) 08/07/2016  . Chronic knee pain (Bilateral) (R>L) 08/07/2016  . Chronic ankle pain (Bilateral) 08/07/2016  . Cervical spondylosis with myelopathy and radiculopathy 08/07/2016  . Panic disorder with agoraphobia 05/29/2016  . Depression, unspecified depression type 05/29/2016  . Atypical lymphocytosis 05/01/2016  . Vitamin D insufficiency 05/01/2016  . Chronic lower extremity cramps (Bilateral) (R>L) 04/29/2016  . Obesity 04/29/2016  . GAD (generalized anxiety disorder) 04/29/2016  . Fatigue 04/29/2016  . Insomnia 07/12/2015  .  Migraine without aura and with status migrainosus, not intractable 07/12/2015  . Chronic superficial gastritis 06/02/2015  . Chronic pain of multiple joints 05/15/2015  . Bilateral leg edema  05/02/2015  . Paroxysmal supraventricular tachycardia ([place]) 04/17/2015  . Exertional shortness of breath 04/17/2015  . Bright red rectal bleeding 04/06/2015  . DDD (degenerative disc disease), lumbosacral 01/24/2014  . DDD (degenerative disc disease), lumbar 01/24/2014  . Cervico-occipital neuralgia 12/29/2013  . Fibromyalgia 12/29/2013  . Migraine headache 12/29/2013  . Menorrhagia 12/10/2012  . Depression, major, recurrent, in remission ([place]) 01/12/2009  . Chest pain 01/12/2009  . Hypertension, benign essential, goal below 140/90 06/23/2008  . History of PSVT (paroxysmal supraventricular tachycardia) 06/17/2008  . Obstructive sleep apnea 06/17/2008  . GERD 06/13/2008    Blythe Stanford, PT DPT 12/05/2020, 3:44 PM  [facility] [address], [place], [address] Phone: [phone]   Fax:  [phone]  Name: Dana Bradley MRN: [id number] Date of Birth: March 24, 1971

## 2020-12-06 ENCOUNTER — Ambulatory Visit: Payer: Self-pay

## 2020-12-07 ENCOUNTER — Telehealth: Payer: Self-pay | Admitting: Pharmacist

## 2020-12-07 NOTE — Telephone Encounter (Signed)
12/07/2020 8:51:27 AM - Creon pending script to dr  -- Elmer Picker - Thursday, December 07, 2020 8:49 AM -- I have received notice from [employer] that Rx for Creon has expired--expired 12/01/2020--I have printed new script and sending Interoffice to Ginger @ [facility] to have provider to sign and return to [place].  Patient will need to be renewed in June for PAP-Creon.

## 2020-12-12 ENCOUNTER — Ambulatory Visit: Payer: Self-pay

## 2020-12-14 ENCOUNTER — Ambulatory Visit: Payer: Self-pay

## 2020-12-18 ENCOUNTER — Ambulatory Visit: Payer: Medicaid Other | Admitting: Obstetrics and Gynecology

## 2020-12-18 ENCOUNTER — Ambulatory Visit: Payer: Medicaid Other

## 2020-12-20 ENCOUNTER — Ambulatory Visit: Payer: Self-pay

## 2020-12-20 ENCOUNTER — Other Ambulatory Visit: Payer: Self-pay

## 2020-12-20 ENCOUNTER — Telehealth: Payer: Self-pay | Admitting: Pharmacist

## 2020-12-20 DIAGNOSIS — R252 Cramp and spasm: Secondary | ICD-10-CM

## 2020-12-20 DIAGNOSIS — G8929 Other chronic pain: Secondary | ICD-10-CM

## 2020-12-20 DIAGNOSIS — M6281 Muscle weakness (generalized): Secondary | ICD-10-CM

## 2020-12-20 DIAGNOSIS — M5442 Lumbago with sciatica, left side: Secondary | ICD-10-CM

## 2020-12-20 DIAGNOSIS — M542 Cervicalgia: Secondary | ICD-10-CM

## 2020-12-20 NOTE — Telephone Encounter (Signed)
12/20/2020 2:08:34 PM - Vyepti-forms to pat. & provider  -- Dana Bradley - Wednesday, December 20, 2020 2:04 PM --Patient brought in a script for Vyepti 100/1 mg  Inject 100mg  once every 3 months. This medication was listed in [facility] but not an application called the company [employer] requested an application be faxed to me, they emailed to me, and I emailed to [place] @ [facility] to load in [place]. I have now printed and mailing patient her portion to sign & return, also mailing to provider Dr. Rolanda Lundborg @ [place]. [address] [place], [place] [address] to sign & return.

## 2020-12-20 NOTE — Therapy (Signed)
[facility] [address], [place], [address] Phone: [phone]   Fax:  [phone]  Physical Therapy Treatment  Patient Details  Name: Dana Bradley MRN: [id number] Date of Birth: 03/30/1971 Referring Provider (PT): Delsa Grana PA   Encounter Date: 12/20/2020   PT End of Session - 12/20/20 1410    Visit Number 2    Number of Visits 17    Date for PT Re-Evaluation 02/27/21    PT Start Time 1115    PT Stop Time 1200    PT Time Calculation (min) 45 min    Activity Tolerance Patient tolerated treatment well    Behavior During Therapy [facility] for tasks assessed/performed           Past Medical History:  Diagnosis Date  . Acute postoperative pain 04/07/2017  . Anxiety   . Bursitis   . Chronic fatigue 12/12/2017  . Chronic fatigue syndrome   . Colitis 2021  . Edema leg 05/02/2015  . Fibromyalgia   . GERD (gastroesophageal reflux disease)   . IBS (irritable bowel syndrome)   . Knee pain, bilateral 12/21/2008   Qualifier: Diagnosis of  By: Hassell Done FNP, Tori Milks    . Lumbar discitis   . Migraines   . Osteoarthritis   . Right hand pain 04/10/2015   [facility] has done nerve conduction studies and ruled out carpal tunnel.   . Sleep apnea   . Spinal stenosis   . SVT (supraventricular tachycardia) ([place])   . Vertigo   . Vitamin D deficiency 05/01/2016    Past Surgical History:  Procedure Laterality Date  . ABLATION     Uterine  . CARDIAC CATHETERIZATION     with ablation  . COLONOSCOPY WITH PROPOFOL N/A 05/17/2015   Procedure: COLONOSCOPY WITH PROPOFOL;  Surgeon: Manya Silvas, MD;  Location: [facility] ENDOSCOPY;  Service: Endoscopy;  Laterality: N/A;  . COLONOSCOPY WITH PROPOFOL N/A 03/20/2020   Procedure: COLONOSCOPY WITH PROPOFOL;  Surgeon: Jonathon Bellows, MD;  Location: [facility] ENDOSCOPY;  Service: Gastroenterology;  Laterality: N/A;  . ESOPHAGOGASTRODUODENOSCOPY N/A 05/17/2015   Procedure:  ESOPHAGOGASTRODUODENOSCOPY (EGD);  Surgeon: Manya Silvas, MD;  Location: [facility] ENDOSCOPY;  Service: Endoscopy;  Laterality: N/A;  . KNEE ARTHROSCOPY    . spg     6/18  . Tibial Tubercle Bypass Right 1998  . TUBAL LIGATION  10/01/99    There were no vitals filed for this visit.   Subjective Assessment - 12/20/20 1137    Subjective Patient states she has been having increased low back and hip pain over the past few days from having to spend increased time in the car.    Pertinent History Fibromyalgia, chronic LBP, neck pain, hx of migranes    Limitations Walking;Lifting    Diagnostic tests None for cervical recently    Patient Stated Goals To decrease migranes    Currently in Pain? Yes    Pain Score 7     Pain Location Back    Pain Orientation Lower    Pain Descriptors / Indicators Aching    Pain Type Chronic pain    Pain Onset More than a month ago    Pain Frequency Constant           TREATMENT Therapeutic Exercise Hip flexor stretch in supine - leg off the side - 5 x 60sec SLUMP glides in sitting - 2 x 20  Half kneeling Hip flexor stretch - 2 x 30 Standing hip flexion stretch -  2 x 30  Performed to help decrease increased hip and LBP       PT Education - 12/20/20 1409    Education Details form/technique with exercise; Hip flexor stretch in supine    Person(s) Educated Patient    Methods Explanation;Demonstration    Comprehension Verbalized understanding;Returned demonstration            PT Short Term Goals - 12/05/20 1522      PT SHORT TERM GOAL #1   Title Patient will be independent with HEP as adjunct to clinical therapy and to reduce total number of visits    Baseline HEP given; 12/06/2020    Time 4    Period Weeks    Status New    Target Date 02/27/21             PT Long Term Goals - 12/05/20 1523      PT LONG TERM GOAL #1   Title Patient will improve her FOTO score by a significant amount as indicated by the FOTO program to indicate  improvement in migrane symptoms    Baseline FOTO:    Time 8    Period Weeks    Status New      PT LONG TERM GOAL #2   Title Patient will have a total of <2 migranes per week to indicate improvement in symptoms and allow for better performance of iADLs.    Baseline 4 migranes per week    Time 8    Period Weeks    Status New      PT LONG TERM GOAL #3   Title Patient will have a worst pain of 6/10 along the cervical spine and base of the skull to indicate significant improvement in symptoms.    Baseline 9/10    Time 8    Period Weeks    Status New                 Plan - 12/20/20 1412    Clinical Impression Statement Increased pain along the TFL on the R side as indicated by TTP and increased pain with performing hip extension into end range. Improvement in pain after performing stretching exercise. Gave patient to perform exercises at home to better [facility] symptoms. Patient will benefit from further skilled therapy to return to prior level of function.    Personal Factors and Comorbidities Comorbidity 3+    Comorbidities Fibromyalgia, OA, Chronic migranes, neck pain, shoulder pain, ext.    Examination-Activity Limitations Lift    Examination-Participation Restrictions Community Activity;Cleaning;Volunteer    Stability/Clinical Decision Making Unstable/Unpredictable    Rehab Potential Fair    PT Frequency 2x / week    PT Duration 8 weeks    PT Treatment/Interventions Electrical Stimulation;Moist Heat;Traction;Cryotherapy;Iontophoresis 4mg /ml Dexamethasone;Therapeutic activities;Therapeutic exercise;Balance training;Neuromuscular re-education;Functional mobility training;Patient/family education;Manual techniques;Dry needling;Passive range of motion;Taping;Joint Manipulations;Spinal Manipulations    PT Next Visit Plan Progress strengthening and AROM Ther ex    PT Home Exercise Plan See education sections    Consulted and Agree with Plan of Care Patient           Patient  will benefit from skilled therapeutic intervention in order to improve the following deficits and impairments:  Pain,Decreased coordination,Increased muscle spasms,Postural dysfunction,Decreased endurance,Decreased range of motion,Decreased strength,Hypomobility,Impaired UE functional use,Decreased activity tolerance  Visit Diagnosis: Cervicalgia  Cramp and spasm  Chronic bilateral low back pain with bilateral sciatica  Muscle weakness (generalized)     Problem List Patient Active Problem List   Diagnosis Date  Noted  . Abnormal MRI, lumbar spine (03/16/2020) 11/09/2020  . Chronic midline thoracic back pain 11/09/2020  . Abnormal MRI, thoracic spine (08/02/2019) 11/09/2020  . Thoracic spinal stenosis (T7-8) 11/09/2020  . Prolapse of thoracic disc with radiculopathy (T7-8) 11/09/2020  . Spasm of muscle of lower back 11/09/2020  . Vertigo, benign paroxysmal, unspecified laterality 11/09/2020  . Uncomplicated opioid dependence ([place]) 11/08/2020  . Hyperalgesia 03/07/2020  . Neurogenic urinary incontinence 03/01/2020  . Other intervertebral disc degeneration, lumbar region 03/01/2020  . Spinal stenosis of lumbosacral region 03/01/2020  . Pharmacologic therapy 02/06/2020  . Lumbar radiculitis (Right) 09/21/2019  . Chronic lower extremity pain (Bilateral) 09/06/2019  . Intractable migraine with aura without status migrainosus 08/10/2019  . Other specified dorsopathies, sacral and sacrococcygeal region 08/03/2019  . Latex precautions, history of latex allergy 08/03/2019  . History of allergy to radiographic contrast media 08/03/2019  . DDD (degenerative disc disease), cervical 07/21/2019  . Cervical facet syndrome (Bilateral) (L>R) 07/21/2019  . DDD (degenerative disc disease), thoracic 07/21/2019  . Osteoarthritis of hip (Left) 07/21/2019  . Chronic groin pain (Bilateral) (L>R) 07/21/2019  . Chronic hip pain (Bilateral) (L>R) 07/21/2019  . Somatic dysfunction of sacroiliac joint  (Bilateral) 07/21/2019  . Migraine with aura and with status migrainosus, not intractable 04/06/2019  . Cervico-occipital neuralgia (Left) 04/06/2019  . Weakness of leg (Left) 04/05/2019  . Difficulty walking 04/05/2019  . Chronic migraine without aura, with intractable migraine, so stated, with status migrainosus 12/27/2018  . Malar rash 09/04/2018  . Trigger point of neck (Left) 03/19/2018  . Occipital headache 12/25/2017  . Chronic fatigue syndrome with fibromyalgia 12/12/2017  . Trigger point of shoulder region (Left) 11/17/2017  . Myofascial pain syndrome (Left) (trapezius muscle) 07/22/2017  . Lumbar L1-2 disc protrusion (Right) 04/07/2017  . Muscle spasticity 04/01/2017  . Osteoarthritis of shoulder (Bilateral) 04/01/2017  . Lumbar spondylosis 01/06/2017  . Chronic hip pain (Left) 12/24/2016  . Chronic sacroiliac joint pain (Left) 12/24/2016  . Lumbar facet syndrome (Bilateral) (L>R) 12/24/2016  . Lumbar radiculitis (Left) 12/24/2016  . Hypertriglyceridemia 11/27/2016  . History of vasovagal episode 10/30/2016  . Cervicogenic headache 09/09/2016  . Medication monitoring encounter 08/29/2016  . Controlled substance agreement signed 08/28/2016  . Plantar fasciitis of left foot 08/28/2016  . Vitamin B12 deficiency 08/28/2016  . Hyperlipidemia 08/28/2016  . Nephrolithiasis 08/12/2016  . Chronic pain syndrome 08/07/2016  . Long term prescription opiate use 08/07/2016  . Opiate use 08/07/2016  . Long term prescription benzodiazepine use 08/07/2016  . Neurogenic pain 08/07/2016  . Chronic low back pain (1ry area of Pain) (Bilateral) (R>L) (midline) 08/07/2016  . Chronic upper back pain (2ry area of Pain) (Bilateral) (L>R) 08/07/2016  . Chronic abdominal pain (Right lower quadrant) 08/07/2016  . Thoracic radiculitis (Bilateral: T10, T11) 08/07/2016  . Chronic occipital neuralgia (3ry area of Pain) (Bilateral) (L>R) 08/07/2016  . Chronic neck pain 08/07/2016  . Chronic cervical  radicular pain (Bilateral) (L>R) 08/07/2016  . Chronic shoulder blade pain (Bilateral) (L>R) 08/07/2016  . Chronic upper extremity pain (Bilateral) (R>L) 08/07/2016  . Chronic knee pain (Bilateral) (R>L) 08/07/2016  . Chronic ankle pain (Bilateral) 08/07/2016  . Cervical spondylosis with myelopathy and radiculopathy 08/07/2016  . Panic disorder with agoraphobia 05/29/2016  . Depression, unspecified depression type 05/29/2016  . Atypical lymphocytosis 05/01/2016  . Vitamin D insufficiency 05/01/2016  . Chronic lower extremity cramps (Bilateral) (R>L) 04/29/2016  . Obesity 04/29/2016  . GAD (generalized anxiety disorder) 04/29/2016  . Fatigue 04/29/2016  . Insomnia  07/12/2015  . Migraine without aura and with status migrainosus, not intractable 07/12/2015  . Chronic superficial gastritis 06/02/2015  . Chronic pain of multiple joints 05/15/2015  . Bilateral leg edema 05/02/2015  . Paroxysmal supraventricular tachycardia ([place]) 04/17/2015  . Exertional shortness of breath 04/17/2015  . Bright red rectal bleeding 04/06/2015  . DDD (degenerative disc disease), lumbosacral 01/24/2014  . DDD (degenerative disc disease), lumbar 01/24/2014  . Cervico-occipital neuralgia 12/29/2013  . Fibromyalgia 12/29/2013  . Migraine headache 12/29/2013  . Menorrhagia 12/10/2012  . Depression, major, recurrent, in remission ([place]) 01/12/2009  . Chest pain 01/12/2009  . Hypertension, benign essential, goal below 140/90 06/23/2008  . History of PSVT (paroxysmal supraventricular tachycardia) 06/17/2008  . Obstructive sleep apnea 06/17/2008  . GERD 06/13/2008    Blythe Stanford, PT DPT 12/20/2020, 2:24 PM  [facility] [address], [place], [address] Phone: [phone]   Fax:  [phone]  Name: Dana Bradley MRN: [id number] Date of Birth: 20-Apr-1971

## 2020-12-27 ENCOUNTER — Other Ambulatory Visit: Payer: Self-pay

## 2020-12-27 ENCOUNTER — Ambulatory Visit: Payer: Self-pay

## 2020-12-27 DIAGNOSIS — R252 Cramp and spasm: Secondary | ICD-10-CM

## 2020-12-27 DIAGNOSIS — M542 Cervicalgia: Secondary | ICD-10-CM

## 2020-12-27 DIAGNOSIS — G8929 Other chronic pain: Secondary | ICD-10-CM

## 2020-12-27 NOTE — Therapy (Signed)
[facility] [address] [place], [address] Phone: [phone]   Fax:  [phone]  Physical Therapy Treatment  Patient Details  Name: Dana Bradley MRN: [id number] Date of Birth: 01-20-1971 Referring Provider (PT): Delsa Grana PA   Encounter Date: 12/27/2020   PT End of Session - 12/27/20 0910    Visit Number 3    Number of Visits 17    Date for PT Re-Evaluation 02/27/21    PT Start Time 0900    PT Stop Time 0945    PT Time Calculation (min) 45 min    Activity Tolerance Patient tolerated treatment well    Behavior During Therapy [facility] for tasks assessed/performed           Past Medical History:  Diagnosis Date  . Acute postoperative pain 04/07/2017  . Anxiety   . Bursitis   . Chronic fatigue 12/12/2017  . Chronic fatigue syndrome   . Colitis 2021  . Edema leg 05/02/2015  . Fibromyalgia   . GERD (gastroesophageal reflux disease)   . IBS (irritable bowel syndrome)   . Knee pain, bilateral 12/21/2008   Qualifier: Diagnosis of  By: Hassell Done FNP, Tori Milks    . Lumbar discitis   . Migraines   . Osteoarthritis   . Right hand pain 04/10/2015   [facility] has done nerve conduction studies and ruled out carpal tunnel.   . Sleep apnea   . Spinal stenosis   . SVT (supraventricular tachycardia) ([place])   . Vertigo   . Vitamin D deficiency 05/01/2016    Past Surgical History:  Procedure Laterality Date  . ABLATION     Uterine  . CARDIAC CATHETERIZATION     with ablation  . COLONOSCOPY WITH PROPOFOL N/A 05/17/2015   Procedure: COLONOSCOPY WITH PROPOFOL;  Surgeon: Manya Silvas, MD;  Location: [facility] ENDOSCOPY;  Service: Endoscopy;  Laterality: N/A;  . COLONOSCOPY WITH PROPOFOL N/A 03/20/2020   Procedure: COLONOSCOPY WITH PROPOFOL;  Surgeon: Jonathon Bellows, MD;  Location: [facility] ENDOSCOPY;  Service: Gastroenterology;  Laterality: N/A;  . ESOPHAGOGASTRODUODENOSCOPY N/A 05/17/2015   Procedure:  ESOPHAGOGASTRODUODENOSCOPY (EGD);  Surgeon: Manya Silvas, MD;  Location: [facility] ENDOSCOPY;  Service: Endoscopy;  Laterality: N/A;  . KNEE ARTHROSCOPY    . spg     6/18  . Tibial Tubercle Bypass Right 1998  . TUBAL LIGATION  10/01/99    There were no vitals filed for this visit.   Subjective Assessment - 12/27/20 0906    Subjective Patient states she is currenlty having low back pain today from sitting in the car for prolonged periods of time, bending over for prolonged period of time, and lifting boxes.    Pertinent History Fibromyalgia, chronic LBP, neck pain, hx of migranes    Limitations Walking;Lifting    Diagnostic tests None for cervical recently    Patient Stated Goals To decrease migranes    Currently in Pain? Yes    Pain Score 7     Pain Location Back    Pain Orientation Lower    Pain Descriptors / Indicators Aching    Pain Type Chronic pain    Pain Onset More than a month ago    Pain Frequency Constant              TREATMENT Therapeutic Exercise Seated prayer stretch in sitting - 3 min SNAG with lumbar spine - 2 x 10 SNAG with thoracic spine - 2 x10  Thoracic rotation seated with OP  from the chair - 2 x 10  Hip flexor stretch in standing - x10  Knee extension in sitting with RTB - 2 x 10  Hip ER with LE straight with RTB  -- 2 x 10  Performed exercises to address LBP and hip flexor flexibility    PT Education - 12/27/20 0910    Education Details form/technique with exercise    Person(s) Educated Patient    Methods Explanation;Demonstration    Comprehension Verbalized understanding;Returned demonstration            PT Short Term Goals - 12/05/20 1522      PT SHORT TERM GOAL #1   Title Patient will be independent with HEP as adjunct to clinical therapy and to reduce total number of visits    Baseline HEP given; 12/06/2020    Time 4    Period Weeks    Status New    Target Date 02/27/21             PT Long Term Goals - 12/05/20 1523      PT  LONG TERM GOAL #1   Title Patient will improve her FOTO score by a significant amount as indicated by the FOTO program to indicate improvement in migrane symptoms    Baseline FOTO:    Time 8    Period Weeks    Status New      PT LONG TERM GOAL #2   Title Patient will have a total of <2 migranes per week to indicate improvement in symptoms and allow for better performance of iADLs.    Baseline 4 migranes per week    Time 8    Period Weeks    Status New      PT LONG TERM GOAL #3   Title Patient will have a worst pain of 6/10 along the cervical spine and base of the skull to indicate significant improvement in symptoms.    Baseline 9/10    Time 8    Period Weeks    Status New                 Plan - 12/27/20 0955    Clinical Impression Statement Patient demonstrates increased irritation along her lumbar extensors and thoracic extensors during today's session. Also introduced quad strengthening for greater stabilization. Educated patient on role of wide spread inflammation with spreading pain. Patient will benefit from furhter skilled therapy focused on improving stabilization to return to prior level of function.    Personal Factors and Comorbidities Comorbidity 3+    Comorbidities Fibromyalgia, OA, Chronic migranes, neck pain, shoulder pain, ext.    Examination-Activity Limitations Lift    Examination-Participation Restrictions Community Activity;Cleaning;Volunteer    Stability/Clinical Decision Making Unstable/Unpredictable    Rehab Potential Fair    PT Frequency 2x / week    PT Duration 8 weeks    PT Treatment/Interventions Electrical Stimulation;Moist Heat;Traction;Cryotherapy;Iontophoresis 4mg /ml Dexamethasone;Therapeutic activities;Therapeutic exercise;Balance training;Neuromuscular re-education;Functional mobility training;Patient/family education;Manual techniques;Dry needling;Passive range of motion;Taping;Joint Manipulations;Spinal Manipulations    PT Next Visit Plan  Progress strengthening and AROM Ther ex    PT Home Exercise Plan See education sections    Consulted and Agree with Plan of Care Patient           Patient will benefit from skilled therapeutic intervention in order to improve the following deficits and impairments:  Pain,Decreased coordination,Increased muscle spasms,Postural dysfunction,Decreased endurance,Decreased range of motion,Decreased strength,Hypomobility,Impaired UE functional use,Decreased activity tolerance  Visit Diagnosis: Cervicalgia  Cramp and spasm  Chronic bilateral  low back pain with bilateral sciatica     Problem List Patient Active Problem List   Diagnosis Date Noted  . Abnormal MRI, lumbar spine (03/16/2020) 11/09/2020  . Chronic midline thoracic back pain 11/09/2020  . Abnormal MRI, thoracic spine (08/02/2019) 11/09/2020  . Thoracic spinal stenosis (T7-8) 11/09/2020  . Prolapse of thoracic disc with radiculopathy (T7-8) 11/09/2020  . Spasm of muscle of lower back 11/09/2020  . Vertigo, benign paroxysmal, unspecified laterality 11/09/2020  . Uncomplicated opioid dependence ([place]) 11/08/2020  . Hyperalgesia 03/07/2020  . Neurogenic urinary incontinence 03/01/2020  . Other intervertebral disc degeneration, lumbar region 03/01/2020  . Spinal stenosis of lumbosacral region 03/01/2020  . Pharmacologic therapy 02/06/2020  . Lumbar radiculitis (Right) 09/21/2019  . Chronic lower extremity pain (Bilateral) 09/06/2019  . Intractable migraine with aura without status migrainosus 08/10/2019  . Other specified dorsopathies, sacral and sacrococcygeal region 08/03/2019  . Latex precautions, history of latex allergy 08/03/2019  . History of allergy to radiographic contrast media 08/03/2019  . DDD (degenerative disc disease), cervical 07/21/2019  . Cervical facet syndrome (Bilateral) (L>R) 07/21/2019  . DDD (degenerative disc disease), thoracic 07/21/2019  . Osteoarthritis of hip (Left) 07/21/2019  . Chronic groin  pain (Bilateral) (L>R) 07/21/2019  . Chronic hip pain (Bilateral) (L>R) 07/21/2019  . Somatic dysfunction of sacroiliac joint (Bilateral) 07/21/2019  . Migraine with aura and with status migrainosus, not intractable 04/06/2019  . Cervico-occipital neuralgia (Left) 04/06/2019  . Weakness of leg (Left) 04/05/2019  . Difficulty walking 04/05/2019  . Chronic migraine without aura, with intractable migraine, so stated, with status migrainosus 12/27/2018  . Malar rash 09/04/2018  . Trigger point of neck (Left) 03/19/2018  . Occipital headache 12/25/2017  . Chronic fatigue syndrome with fibromyalgia 12/12/2017  . Trigger point of shoulder region (Left) 11/17/2017  . Myofascial pain syndrome (Left) (trapezius muscle) 07/22/2017  . Lumbar L1-2 disc protrusion (Right) 04/07/2017  . Muscle spasticity 04/01/2017  . Osteoarthritis of shoulder (Bilateral) 04/01/2017  . Lumbar spondylosis 01/06/2017  . Chronic hip pain (Left) 12/24/2016  . Chronic sacroiliac joint pain (Left) 12/24/2016  . Lumbar facet syndrome (Bilateral) (L>R) 12/24/2016  . Lumbar radiculitis (Left) 12/24/2016  . Hypertriglyceridemia 11/27/2016  . History of vasovagal episode 10/30/2016  . Cervicogenic headache 09/09/2016  . Medication monitoring encounter 08/29/2016  . Controlled substance agreement signed 08/28/2016  . Plantar fasciitis of left foot 08/28/2016  . Vitamin B12 deficiency 08/28/2016  . Hyperlipidemia 08/28/2016  . Nephrolithiasis 08/12/2016  . Chronic pain syndrome 08/07/2016  . Long term prescription opiate use 08/07/2016  . Opiate use 08/07/2016  . Long term prescription benzodiazepine use 08/07/2016  . Neurogenic pain 08/07/2016  . Chronic low back pain (1ry area of Pain) (Bilateral) (R>L) (midline) 08/07/2016  . Chronic upper back pain (2ry area of Pain) (Bilateral) (L>R) 08/07/2016  . Chronic abdominal pain (Right lower quadrant) 08/07/2016  . Thoracic radiculitis (Bilateral: T10, T11) 08/07/2016  .  Chronic occipital neuralgia (3ry area of Pain) (Bilateral) (L>R) 08/07/2016  . Chronic neck pain 08/07/2016  . Chronic cervical radicular pain (Bilateral) (L>R) 08/07/2016  . Chronic shoulder blade pain (Bilateral) (L>R) 08/07/2016  . Chronic upper extremity pain (Bilateral) (R>L) 08/07/2016  . Chronic knee pain (Bilateral) (R>L) 08/07/2016  . Chronic ankle pain (Bilateral) 08/07/2016  . Cervical spondylosis with myelopathy and radiculopathy 08/07/2016  . Panic disorder with agoraphobia 05/29/2016  . Depression, unspecified depression type 05/29/2016  . Atypical lymphocytosis 05/01/2016  . Vitamin D insufficiency 05/01/2016  . Chronic lower extremity cramps (Bilateral) (  R>L) 04/29/2016  . Obesity 04/29/2016  . GAD (generalized anxiety disorder) 04/29/2016  . Fatigue 04/29/2016  . Insomnia 07/12/2015  . Migraine without aura and with status migrainosus, not intractable 07/12/2015  . Chronic superficial gastritis 06/02/2015  . Chronic pain of multiple joints 05/15/2015  . Bilateral leg edema 05/02/2015  . Paroxysmal supraventricular tachycardia ([place]) 04/17/2015  . Exertional shortness of breath 04/17/2015  . Bright red rectal bleeding 04/06/2015  . DDD (degenerative disc disease), lumbosacral 01/24/2014  . DDD (degenerative disc disease), lumbar 01/24/2014  . Cervico-occipital neuralgia 12/29/2013  . Fibromyalgia 12/29/2013  . Migraine headache 12/29/2013  . Menorrhagia 12/10/2012  . Depression, major, recurrent, in remission ([place]) 01/12/2009  . Chest pain 01/12/2009  . Hypertension, benign essential, goal below 140/90 06/23/2008  . History of PSVT (paroxysmal supraventricular tachycardia) 06/17/2008  . Obstructive sleep apnea 06/17/2008  . GERD 06/13/2008    Blythe Stanford, PT DPT 12/27/2020, 4:26 PM  [facility] [address], [place], [address] Phone: [phone]   Fax:  [phone]  Name: Dana Bradley MRN: [id number] Date of Birth: 17-Dec-1970

## 2021-01-02 ENCOUNTER — Other Ambulatory Visit: Payer: Self-pay | Admitting: Obstetrics and Gynecology

## 2021-01-02 ENCOUNTER — Ambulatory Visit: Payer: Self-pay

## 2021-01-02 DIAGNOSIS — R102 Pelvic and perineal pain: Secondary | ICD-10-CM

## 2021-01-02 DIAGNOSIS — G8929 Other chronic pain: Secondary | ICD-10-CM

## 2021-01-03 ENCOUNTER — Ambulatory Visit
Admission: RE | Admit: 2021-01-03 | Discharge: 2021-01-03 | Disposition: A | Discharge status: Home / Self Care | Payer: Self-pay | Source: Ambulatory Visit | Attending: Obstetrics and Gynecology | Admitting: Obstetrics and Gynecology

## 2021-01-03 DIAGNOSIS — G8929 Other chronic pain: Secondary | ICD-10-CM

## 2021-01-08 ENCOUNTER — Ambulatory Visit: Payer: Self-pay

## 2021-01-08 ENCOUNTER — Ambulatory Visit: Payer: Self-pay | Attending: Physical Medicine and Rehabilitation

## 2021-01-08 ENCOUNTER — Other Ambulatory Visit: Payer: Self-pay

## 2021-01-08 DIAGNOSIS — M5440 Lumbago with sciatica, unspecified side: Secondary | ICD-10-CM | POA: Insufficient documentation

## 2021-01-08 DIAGNOSIS — M6281 Muscle weakness (generalized): Secondary | ICD-10-CM | POA: Insufficient documentation

## 2021-01-08 DIAGNOSIS — M5442 Lumbago with sciatica, left side: Secondary | ICD-10-CM | POA: Insufficient documentation

## 2021-01-08 DIAGNOSIS — M542 Cervicalgia: Secondary | ICD-10-CM | POA: Insufficient documentation

## 2021-01-08 DIAGNOSIS — M5441 Lumbago with sciatica, right side: Secondary | ICD-10-CM | POA: Insufficient documentation

## 2021-01-08 DIAGNOSIS — G8929 Other chronic pain: Secondary | ICD-10-CM | POA: Insufficient documentation

## 2021-01-08 DIAGNOSIS — R252 Cramp and spasm: Secondary | ICD-10-CM | POA: Insufficient documentation

## 2021-01-08 NOTE — Therapy (Signed)
[facility] [address] [place], [address] Phone: [phone]   Fax:  [phone]  Physical Therapy Treatment  Patient Details  Name: Dana Bradley MRN: [id number] Date of Birth: [birth year] Referring Provider (PT): Delsa Grana PA   Encounter Date: 01/08/2021   PT End of Session - 01/08/21 1129    Visit Number 4    Number of Visits 17    Date for PT Re-Evaluation 02/27/21    Authorization Type [place] Medicaid    Authorization Time Period 12/04/20-02/27/21    PT Start Time 1120    PT Stop Time 1200    PT Time Calculation (min) 40 min    Activity Tolerance Patient tolerated treatment well;No increased pain    Behavior During Therapy WFL for tasks assessed/performed           Past Medical History:  Diagnosis Date  . Acute postoperative pain 04/07/2017  . Anxiety   . Bursitis   . Chronic fatigue 12/12/2017  . Chronic fatigue syndrome   . Colitis 2021  . Edema leg 05/02/2015  . Fibromyalgia   . GERD (gastroesophageal reflux disease)   . IBS (irritable bowel syndrome)   . Knee pain, bilateral 12/21/2008   Qualifier: Diagnosis of  By: Hassell Done FNP, Tori Milks    . Lumbar discitis   . Migraines   . Osteoarthritis   . Right hand pain 04/10/2015   [facility] has done nerve conduction studies and ruled out carpal tunnel.   . Sleep apnea   . Spinal stenosis   . SVT (supraventricular tachycardia) ([place])   . Vertigo   . Vitamin D deficiency 05/01/2016    Past Surgical History:  Procedure Laterality Date  . ABLATION     Uterine  . CARDIAC CATHETERIZATION     with ablation  . COLONOSCOPY WITH PROPOFOL N/A 05/17/2015   Procedure: COLONOSCOPY WITH PROPOFOL;  Surgeon: Manya Silvas, MD;  Location: [facility] ENDOSCOPY;  Service: Endoscopy;  Laterality: N/A;  . COLONOSCOPY WITH PROPOFOL N/A 03/20/2020   Procedure: COLONOSCOPY WITH PROPOFOL;  Surgeon: Jonathon Bellows, MD;  Location: [facility] ENDOSCOPY;  Service:  Gastroenterology;  Laterality: N/A;  . ESOPHAGOGASTRODUODENOSCOPY N/A 05/17/2015   Procedure: ESOPHAGOGASTRODUODENOSCOPY (EGD);  Surgeon: Manya Silvas, MD;  Location: [facility] ENDOSCOPY;  Service: Endoscopy;  Laterality: N/A;  . KNEE ARTHROSCOPY    . spg     6/18  . Tibial Tubercle Bypass Right 1998  . TUBAL LIGATION  10/01/99    There were no vitals filed for this visit.   Subjective Assessment - 01/08/21 1123    Subjective Pt still having migraines 5x/weekly. She still is bothered by her nekc pain at night when sleeping.    Pertinent History Fibromyalgia, chronic LBP, neck pain, hx of migranes    Currently in Pain? Yes    Pain Score 3     Pain Location --   Left upper trap with mild anterior distribution.   Pain Orientation Left           INTERVENTION THIS DATE:  -Supine on legs elevated, heat to left shoulder x 3 minutes, then concurrent with interventions  -MFR to left UT x 15 minutes, ART to Left upper trap paired with cervical Rt rotation, then Left GHJ ER  -isometric scapular retraction, shoulder extension into table 10x5secH  -BUE chest press 3lbFW, 1x13 -cervical retraction into pillow to pt's neurtal 15x3secH  -seated BUE shoulder ABDCT 1x15      PT Short Term  Goals - 12/05/20 1522      PT SHORT TERM GOAL #1   Title Patient will be independent with HEP as adjunct to clinical therapy and to reduce total number of visits    Baseline HEP given; 12/06/2020    Time 4    Period Weeks    Status New    Target Date 02/27/21             PT Long Term Goals - 12/05/20 1523      PT LONG TERM GOAL #1   Title Patient will improve her FOTO score by a significant amount as indicated by the FOTO program to indicate improvement in migrane symptoms    Baseline FOTO:    Time 8    Period Weeks    Status New      PT LONG TERM GOAL #2   Title Patient will have a total of <2 migranes per week to indicate improvement in symptoms and allow for better performance of iADLs.     Baseline 4 migranes per week    Time 8    Period Weeks    Status New      PT LONG TERM GOAL #3   Title Patient will have a worst pain of 6/10 along the cervical spine and base of the skull to indicate significant improvement in symptoms.    Baseline 9/10    Time 8    Period Weeks    Status New                 Plan - 01/08/21 1131    Clinical Impression Statement Continued with current plan of care as laid out in evaluation and recent prior sessions. Pt remains motivated to advance progress toward goals. Rest breaks provided as needed, pt quick to ask when needed. Author maintains all interventions within appropriate level of intensity as not to purposefully exacerbate pain. Pt does require varying levels of assistance and cuing for completion of exercises for correct form and sometimes due to pain/weakness. Utilized heat and myofascial srtetch to help with pain control and taut bands in the Left upper trap. Pt continues to demonstrate progress toward goals AEB progression of some interventions this date either in volume or intensity. No updates to HEP this date.    Personal Factors and Comorbidities Comorbidity 3+    Comorbidities Fibromyalgia, OA, Chronic migranes, neck pain, shoulder pain, ext.    Examination-Activity Limitations Lift    Examination-Participation Restrictions Community Activity;Cleaning;Volunteer    Stability/Clinical Decision Making Unstable/Unpredictable    Clinical Decision Making High    Rehab Potential Fair    PT Frequency 2x / week    PT Duration 8 weeks    PT Treatment/Interventions Electrical Stimulation;Moist Heat;Traction;Cryotherapy;Iontophoresis 4mg /ml Dexamethasone;Therapeutic activities;Therapeutic exercise;Balance training;Neuromuscular re-education;Functional mobility training;Patient/family education;Manual techniques;Dry needling;Passive range of motion;Taping;Joint Manipulations;Spinal Manipulations    PT Next Visit Plan Progress strengthening  and AROM Ther ex    PT Home Exercise Plan See education sections    Consulted and Agree with Plan of Care Patient           Patient will benefit from skilled therapeutic intervention in order to improve the following deficits and impairments:  Pain,Decreased coordination,Increased muscle spasms,Postural dysfunction,Decreased endurance,Decreased range of motion,Decreased strength,Hypomobility,Impaired UE functional use,Decreased activity tolerance  Visit Diagnosis: Cervicalgia  Cramp and spasm  Chronic bilateral low back pain with bilateral sciatica     Problem List Patient Active Problem List   Diagnosis Date Noted  . Abnormal MRI, lumbar spine (  03/16/2020) 11/09/2020  . Chronic midline thoracic back pain 11/09/2020  . Abnormal MRI, thoracic spine (08/02/2019) 11/09/2020  . Thoracic spinal stenosis (T7-8) 11/09/2020  . Prolapse of thoracic disc with radiculopathy (T7-8) 11/09/2020  . Spasm of muscle of lower back 11/09/2020  . Vertigo, benign paroxysmal, unspecified laterality 11/09/2020  . Uncomplicated opioid dependence ([place]) 11/08/2020  . Hyperalgesia 03/07/2020  . Neurogenic urinary incontinence 03/01/2020  . Other intervertebral disc degeneration, lumbar region 03/01/2020  . Spinal stenosis of lumbosacral region 03/01/2020  . Pharmacologic therapy 02/06/2020  . Lumbar radiculitis (Right) 09/21/2019  . Chronic lower extremity pain (Bilateral) 09/06/2019  . Intractable migraine with aura without status migrainosus 08/10/2019  . Other specified dorsopathies, sacral and sacrococcygeal region 08/03/2019  . Latex precautions, history of latex allergy 08/03/2019  . History of allergy to radiographic contrast media 08/03/2019  . DDD (degenerative disc disease), cervical 07/21/2019  . Cervical facet syndrome (Bilateral) (L>R) 07/21/2019  . DDD (degenerative disc disease), thoracic 07/21/2019  . Osteoarthritis of hip (Left) 07/21/2019  . Chronic groin pain (Bilateral) (L>R)  07/21/2019  . Chronic hip pain (Bilateral) (L>R) 07/21/2019  . Somatic dysfunction of sacroiliac joint (Bilateral) 07/21/2019  . Migraine with aura and with status migrainosus, not intractable 04/06/2019  . Cervico-occipital neuralgia (Left) 04/06/2019  . Weakness of leg (Left) 04/05/2019  . Difficulty walking 04/05/2019  . Chronic migraine without aura, with intractable migraine, so stated, with status migrainosus 12/27/2018  . Malar rash 09/04/2018  . Trigger point of neck (Left) 03/19/2018  . Occipital headache 12/25/2017  . Chronic fatigue syndrome with fibromyalgia 12/12/2017  . Trigger point of shoulder region (Left) 11/17/2017  . Myofascial pain syndrome (Left) (trapezius muscle) 07/22/2017  . Lumbar L1-2 disc protrusion (Right) 04/07/2017  . Muscle spasticity 04/01/2017  . Osteoarthritis of shoulder (Bilateral) 04/01/2017  . Lumbar spondylosis 01/06/2017  . Chronic hip pain (Left) 12/24/2016  . Chronic sacroiliac joint pain (Left) 12/24/2016  . Lumbar facet syndrome (Bilateral) (L>R) 12/24/2016  . Lumbar radiculitis (Left) 12/24/2016  . Hypertriglyceridemia 11/27/2016  . History of vasovagal episode 10/30/2016  . Cervicogenic headache 09/09/2016  . Medication monitoring encounter 08/29/2016  . Controlled substance agreement signed 08/28/2016  . Plantar fasciitis of left foot 08/28/2016  . Vitamin B12 deficiency 08/28/2016  . Hyperlipidemia 08/28/2016  . Nephrolithiasis 08/12/2016  . Chronic pain syndrome 08/07/2016  . Long term prescription opiate use 08/07/2016  . Opiate use 08/07/2016  . Long term prescription benzodiazepine use 08/07/2016  . Neurogenic pain 08/07/2016  . Chronic low back pain (1ry area of Pain) (Bilateral) (R>L) (midline) 08/07/2016  . Chronic upper back pain (2ry area of Pain) (Bilateral) (L>R) 08/07/2016  . Chronic abdominal pain (Right lower quadrant) 08/07/2016  . Thoracic radiculitis (Bilateral: T10, T11) 08/07/2016  . Chronic occipital  neuralgia (3ry area of Pain) (Bilateral) (L>R) 08/07/2016  . Chronic neck pain 08/07/2016  . Chronic cervical radicular pain (Bilateral) (L>R) 08/07/2016  . Chronic shoulder blade pain (Bilateral) (L>R) 08/07/2016  . Chronic upper extremity pain (Bilateral) (R>L) 08/07/2016  . Chronic knee pain (Bilateral) (R>L) 08/07/2016  . Chronic ankle pain (Bilateral) 08/07/2016  . Cervical spondylosis with myelopathy and radiculopathy 08/07/2016  . Panic disorder with agoraphobia 05/29/2016  . Depression, unspecified depression type 05/29/2016  . Atypical lymphocytosis 05/01/2016  . Vitamin D insufficiency 05/01/2016  . Chronic lower extremity cramps (Bilateral) (R>L) 04/29/2016  . Obesity 04/29/2016  . GAD (generalized anxiety disorder) 04/29/2016  . Fatigue 04/29/2016  . Insomnia 07/12/2015  . Migraine without aura and  with status migrainosus, not intractable 07/12/2015  . Chronic superficial gastritis 06/02/2015  . Chronic pain of multiple joints 05/15/2015  . Bilateral leg edema 05/02/2015  . Paroxysmal supraventricular tachycardia ([place]) 04/17/2015  . Exertional shortness of breath 04/17/2015  . Bright red rectal bleeding 04/06/2015  . DDD (degenerative disc disease), lumbosacral 01/24/2014  . DDD (degenerative disc disease), lumbar 01/24/2014  . Cervico-occipital neuralgia 12/29/2013  . Fibromyalgia 12/29/2013  . Migraine headache 12/29/2013  . Menorrhagia 12/10/2012  . Depression, major, recurrent, in remission ([place]) 01/12/2009  . Chest pain 01/12/2009  . Hypertension, benign essential, goal below 140/90 06/23/2008  . History of PSVT (paroxysmal supraventricular tachycardia) 06/17/2008  . Obstructive sleep apnea 06/17/2008  . GERD 06/13/2008   11:59 AM, 01/08/21 Etta Grandchild, PT, DPT Physical Therapist - [place] [phone] (Office)   Annalynne Ibanez C 01/08/2021, 11:33 AM  [facility] [address] [place], [address] Phone: [phone]   Fax:  [phone]  Name: Dana Bradley MRN: [id number] Date of Birth: 1971-06-21

## 2021-01-10 ENCOUNTER — Ambulatory Visit: Payer: Medicaid Other

## 2021-01-12 ENCOUNTER — Other Ambulatory Visit: Payer: Self-pay

## 2021-01-15 ENCOUNTER — Other Ambulatory Visit: Payer: Self-pay

## 2021-01-15 ENCOUNTER — Other Ambulatory Visit: Payer: Self-pay | Admitting: Physical Medicine and Rehabilitation

## 2021-01-15 ENCOUNTER — Ambulatory Visit: Payer: Self-pay

## 2021-01-15 DIAGNOSIS — M6281 Muscle weakness (generalized): Secondary | ICD-10-CM

## 2021-01-15 DIAGNOSIS — G8929 Other chronic pain: Secondary | ICD-10-CM

## 2021-01-15 DIAGNOSIS — M542 Cervicalgia: Secondary | ICD-10-CM

## 2021-01-15 MED ORDER — BUTALBITAL-APAP-CAFFEINE 50-325-40 MG PO TABS: 1.0000 | ORAL_TABLET | Freq: Three times a day (TID) | ORAL | 0 refills | Status: DC | PRN

## 2021-01-15 NOTE — Therapy (Signed)
[facility] [address] [place], [address] Phone: [phone]   Fax:  [phone]  Physical Therapy Treatment  Patient Details  Name: Dana Bradley MRN: [id number] Date of Birth: Nov 01, 1970 Referring Provider (PT): Delsa Grana PA   Encounter Date: 01/15/2021   PT End of Session - 01/15/21 1141    Visit Number 5    Number of Visits 17    Date for PT Re-Evaluation 02/27/21    Authorization Type [place] Medicaid    Authorization Time Period 12/04/20-02/27/21    PT Start Time 1046    PT Stop Time 1131    PT Time Calculation (min) 45 min    Activity Tolerance Other (comment)   IMproved pain in R hip and sciatica but worsening pain in R ITB   Behavior During Therapy [facility] for tasks assessed/performed           Past Medical History:  Diagnosis Date  . Acute postoperative pain 04/07/2017  . Anxiety   . Bursitis   . Chronic fatigue 12/12/2017  . Chronic fatigue syndrome   . Colitis 2021  . Edema leg 05/02/2015  . Fibromyalgia   . GERD (gastroesophageal reflux disease)   . IBS (irritable bowel syndrome)   . Knee pain, bilateral 12/21/2008   Qualifier: Diagnosis of  By: Hassell Done FNP, Tori Milks    . Lumbar discitis   . Migraines   . Osteoarthritis   . Right hand pain 04/10/2015   [facility] has done nerve conduction studies and ruled out carpal tunnel.   . Sleep apnea   . Spinal stenosis   . SVT (supraventricular tachycardia) ([place])   . Vertigo   . Vitamin D deficiency 05/01/2016    Past Surgical History:  Procedure Laterality Date  . ABLATION     Uterine  . CARDIAC CATHETERIZATION     with ablation  . COLONOSCOPY WITH PROPOFOL N/A 05/17/2015   Procedure: COLONOSCOPY WITH PROPOFOL;  Surgeon: Manya Silvas, MD;  Location: [facility] ENDOSCOPY;  Service: Endoscopy;  Laterality: N/A;  . COLONOSCOPY WITH PROPOFOL N/A 03/20/2020   Procedure: COLONOSCOPY WITH PROPOFOL;  Surgeon: Jonathon Bellows, MD;  Location: [facility]  ENDOSCOPY;  Service: Gastroenterology;  Laterality: N/A;  . ESOPHAGOGASTRODUODENOSCOPY N/A 05/17/2015   Procedure: ESOPHAGOGASTRODUODENOSCOPY (EGD);  Surgeon: Manya Silvas, MD;  Location[facility] ENDOSCOPY;  Service: Endoscopy;  Laterality: N/A;  . KNEE ARTHROSCOPY    . spg     6/18  . Tibial Tubercle Bypass Right 1998  . TUBAL LIGATION  10/01/99    There were no vitals filed for this visit.   Subjective Assessment - 01/15/21 1050    Subjective Pt reports biggest area today to focus on is her sciatica. Had a migraine yesterday but currently no migraine. Cervical pain reported at 3/10 NPS. RLE pain 7/10 NPS worse walking and standing.    Pertinent History Fibromyalgia, chronic LBP, neck pain, hx of migranes    Currently in Pain? Yes    Pain Score 7     Pain Location Leg    Pain Orientation Right    Pain Descriptors / Indicators Aching;Sharp    Pain Type Chronic pain    Pain Onset More than a month ago    Pain Frequency Constant           Manual therapy:   Supine with Knees on bolster: R hip and knee distraction: 4x30 sec bouts. Pain in R lat hip in loose packed position so RLE maintained  in neutral position.   R knee to chest for glut stretch: 1x5, 10 sec holds    There.ex:   Nu-Step L1 for 5 min for warm-up: not billed   Seated slump sciatic glide for nerve mobility: 1x10. Mod VC and PT demo for performing in tolerable range.  Hook-lying alternating isometric add/abd: x10/motion with 8 sec holds for pain modulation. No changes.  Seated lumbar extension in varying positions:    Seated lumbar extension arms across chest   Seated lumbar extension against 1/2 bolster.    Seated lumbar extension with towel for MWM  Seated lumbar/thoracic R rotation x10   Seated modified FABER stretch: Pain and N/T in sciatic distribution so discontinued.   Progressed to seated R hip alternating ER/ER x10/LE  Seated resisted hip ER with GTB: 2x10.     Pt reports improved pain along sciatic  nerve and R hip but increased pain in R ITB after treatment. Pt required multimodal cuing and form adjustment to reduce aggravating symptoms from other musculoskeletal pains.    PT Education - 01/15/21 1140    Education Details form/tehcnique with exercise.    Person(s) Educated Patient    Methods Explanation;Demonstration    Comprehension Verbalized understanding;Returned demonstration            PT Short Term Goals - 12/05/20 1522      PT SHORT TERM GOAL #1   Title Patient will be independent with HEP as adjunct to clinical therapy and to reduce total number of visits    Baseline HEP given; 12/06/2020    Time 4    Period Weeks    Status New    Target Date 02/27/21             PT Long Term Goals - 12/05/20 1523      PT LONG TERM GOAL #1   Title Patient will improve her FOTO score by a significant amount as indicated by the FOTO program to indicate improvement in migrane symptoms    Baseline FOTO:    Time 8    Period Weeks    Status New      PT LONG TERM GOAL #2   Title Patient will have a total of <2 migranes per week to indicate improvement in symptoms and allow for better performance of iADLs.    Baseline 4 migranes per week    Time 8    Period Weeks    Status New      PT LONG TERM GOAL #3   Title Patient will have a worst pain of 6/10 along the cervical spine and base of the skull to indicate significant improvement in symptoms.    Baseline 9/10    Time 8    Period Weeks    Status New                 Plan - 01/15/21 1141    Clinical Impression Statement Pt biggest concern for therapy today is not her neck but her RLE and sciatic symptoms. Prior to treatment pain recorded at 7/10 NPS with ambulation. Manual techniques applied with pillows under pelvis and bolster to maintain slightly flexed lumbar spine. Distraction techniques applied improved R hip pain but required neutral positioning of hip due to loose packed position creating increased medial knee  pain and lateral hip pain. INcrease in sciatic symptoms with seated FABER stretch. Post treatment pt reports improved sciatic pain and hip pain but increased tenderness and pain in her R ITB. Pt can benefit  from further skilled PT services to address pain, muscle weakness, and limited ROM to improve QoL.    Personal Factors and Comorbidities Comorbidity 3+    Comorbidities Fibromyalgia, OA, Chronic migranes, neck pain, shoulder pain, ext.    Examination-Activity Limitations Lift    Examination-Participation Restrictions Community Activity;Cleaning;Volunteer    Stability/Clinical Decision Making Unstable/Unpredictable    Rehab Potential Fair    PT Frequency 2x / week    PT Duration 8 weeks    PT Treatment/Interventions Electrical Stimulation;Moist Heat;Traction;Cryotherapy;Iontophoresis 4mg /ml Dexamethasone;Therapeutic activities;Therapeutic exercise;Balance training;Neuromuscular re-education;Functional mobility training;Patient/family education;Manual techniques;Dry needling;Passive range of motion;Taping;Joint Manipulations;Spinal Manipulations    PT Next Visit Plan Progress strengthening and AROM Ther ex    PT Home Exercise Plan See education sections    Consulted and Agree with Plan of Care Patient           Patient will benefit from skilled therapeutic intervention in order to improve the following deficits and impairments:  Pain,Decreased coordination,Increased muscle spasms,Postural dysfunction,Decreased endurance,Decreased range of motion,Decreased strength,Hypomobility,Impaired UE functional use,Decreased activity tolerance  Visit Diagnosis: Cervicalgia  Chronic low back pain with sciatica, sciatica laterality unspecified, unspecified back pain laterality  Muscle weakness (generalized)     Problem List Patient Active Problem List   Diagnosis Date Noted  . Abnormal MRI, lumbar spine (03/16/2020) 11/09/2020  . Chronic midline thoracic back pain 11/09/2020  . Abnormal MRI,  thoracic spine (08/02/2019) 11/09/2020  . Thoracic spinal stenosis (T7-8) 11/09/2020  . Prolapse of thoracic disc with radiculopathy (T7-8) 11/09/2020  . Spasm of muscle of lower back 11/09/2020  . Vertigo, benign paroxysmal, unspecified laterality 11/09/2020  . Uncomplicated opioid dependence ([place]) 11/08/2020  . Hyperalgesia 03/07/2020  . Neurogenic urinary incontinence 03/01/2020  . Other intervertebral disc degeneration, lumbar region 03/01/2020  . Spinal stenosis of lumbosacral region 03/01/2020  . Pharmacologic therapy 02/06/2020  . Lumbar radiculitis (Right) 09/21/2019  . Chronic lower extremity pain (Bilateral) 09/06/2019  . Intractable migraine with aura without status migrainosus 08/10/2019  . Other specified dorsopathies, sacral and sacrococcygeal region 08/03/2019  . Latex precautions, history of latex allergy 08/03/2019  . History of allergy to radiographic contrast media 08/03/2019  . DDD (degenerative disc disease), cervical 07/21/2019  . Cervical facet syndrome (Bilateral) (L>R) 07/21/2019  . DDD (degenerative disc disease), thoracic 07/21/2019  . Osteoarthritis of hip (Left) 07/21/2019  . Chronic groin pain (Bilateral) (L>R) 07/21/2019  . Chronic hip pain (Bilateral) (L>R) 07/21/2019  . Somatic dysfunction of sacroiliac joint (Bilateral) 07/21/2019  . Migraine with aura and with status migrainosus, not intractable 04/06/2019  . Cervico-occipital neuralgia (Left) 04/06/2019  . Weakness of leg (Left) 04/05/2019  . Difficulty walking 04/05/2019  . Chronic migraine without aura, with intractable migraine, so stated, with status migrainosus 12/27/2018  . Malar rash 09/04/2018  . Trigger point of neck (Left) 03/19/2018  . Occipital headache 12/25/2017  . Chronic fatigue syndrome with fibromyalgia 12/12/2017  . Trigger point of shoulder region (Left) 11/17/2017  . Myofascial pain syndrome (Left) (trapezius muscle) 07/22/2017  . Lumbar L1-2 disc protrusion (Right)  04/07/2017  . Muscle spasticity 04/01/2017  . Osteoarthritis of shoulder (Bilateral) 04/01/2017  . Lumbar spondylosis 01/06/2017  . Chronic hip pain (Left) 12/24/2016  . Chronic sacroiliac joint pain (Left) 12/24/2016  . Lumbar facet syndrome (Bilateral) (L>R) 12/24/2016  . Lumbar radiculitis (Left) 12/24/2016  . Hypertriglyceridemia 11/27/2016  . History of vasovagal episode 10/30/2016  . Cervicogenic headache 09/09/2016  . Medication monitoring encounter 08/29/2016  . Controlled substance agreement signed 08/28/2016  . Plantar fasciitis  of left foot 08/28/2016  . Vitamin B12 deficiency 08/28/2016  . Hyperlipidemia 08/28/2016  . Nephrolithiasis 08/12/2016  . Chronic pain syndrome 08/07/2016  . Long term prescription opiate use 08/07/2016  . Opiate use 08/07/2016  . Long term prescription benzodiazepine use 08/07/2016  . Neurogenic pain 08/07/2016  . Chronic low back pain (1ry area of Pain) (Bilateral) (R>L) (midline) 08/07/2016  . Chronic upper back pain (2ry area of Pain) (Bilateral) (L>R) 08/07/2016  . Chronic abdominal pain (Right lower quadrant) 08/07/2016  . Thoracic radiculitis (Bilateral: T10, T11) 08/07/2016  . Chronic occipital neuralgia (3ry area of Pain) (Bilateral) (L>R) 08/07/2016  . Chronic neck pain 08/07/2016  . Chronic cervical radicular pain (Bilateral) (L>R) 08/07/2016  . Chronic shoulder blade pain (Bilateral) (L>R) 08/07/2016  . Chronic upper extremity pain (Bilateral) (R>L) 08/07/2016  . Chronic knee pain (Bilateral) (R>L) 08/07/2016  . Chronic ankle pain (Bilateral) 08/07/2016  . Cervical spondylosis with myelopathy and radiculopathy 08/07/2016  . Panic disorder with agoraphobia 05/29/2016  . Depression, unspecified depression type 05/29/2016  . Atypical lymphocytosis 05/01/2016  . Vitamin D insufficiency 05/01/2016  . Chronic lower extremity cramps (Bilateral) (R>L) 04/29/2016  . Obesity 04/29/2016  . GAD (generalized anxiety disorder) 04/29/2016  .  Fatigue 04/29/2016  . Insomnia 07/12/2015  . Migraine without aura and with status migrainosus, not intractable 07/12/2015  . Chronic superficial gastritis 06/02/2015  . Chronic pain of multiple joints 05/15/2015  . Bilateral leg edema 05/02/2015  . Paroxysmal supraventricular tachycardia ([place]) 04/17/2015  . Exertional shortness of breath 04/17/2015  . Bright red rectal bleeding 04/06/2015  . DDD (degenerative disc disease), lumbosacral 01/24/2014  . DDD (degenerative disc disease), lumbar 01/24/2014  . Cervico-occipital neuralgia 12/29/2013  . Fibromyalgia 12/29/2013  . Migraine headache 12/29/2013  . Menorrhagia 12/10/2012  . Depression, major, recurrent, in remission ([place]) 01/12/2009  . Chest pain 01/12/2009  . Hypertension, benign essential, goal below 140/90 06/23/2008  . History of PSVT (paroxysmal supraventricular tachycardia) 06/17/2008  . Obstructive sleep apnea 06/17/2008  . GERD 06/13/2008    Salem Caster. Fairly IV, PT, DPT Physical Therapist- [facility]  01/15/2021, 12:57 PM  [facility] [address] [place], [address] Phone: [phone]   Fax:  [phone]  Name: Dana Bradley MRN: [id number] Date of Birth: 1971-02-04

## 2021-01-17 ENCOUNTER — Ambulatory Visit (INDEPENDENT_AMBULATORY_CARE_PROVIDER_SITE_OTHER): Payer: Medicaid Other | Admitting: Gastroenterology

## 2021-01-17 ENCOUNTER — Other Ambulatory Visit: Payer: Self-pay | Admitting: Family Medicine

## 2021-01-17 ENCOUNTER — Other Ambulatory Visit: Payer: Self-pay

## 2021-01-17 ENCOUNTER — Telehealth: Payer: Self-pay

## 2021-01-17 ENCOUNTER — Encounter: Payer: Self-pay | Admitting: Gastroenterology

## 2021-01-17 ENCOUNTER — Other Ambulatory Visit: Payer: Self-pay | Admitting: Gastroenterology

## 2021-01-17 VITALS — BP 130/81 | HR 91 | Ht 63.0 in | Wt 219.8 lb

## 2021-01-17 DIAGNOSIS — R109 Unspecified abdominal pain: Secondary | ICD-10-CM

## 2021-01-17 DIAGNOSIS — K8681 Exocrine pancreatic insufficiency: Secondary | ICD-10-CM

## 2021-01-17 DIAGNOSIS — E782 Mixed hyperlipidemia: Secondary | ICD-10-CM

## 2021-01-17 DIAGNOSIS — R102 Pelvic and perineal pain: Secondary | ICD-10-CM

## 2021-01-17 DIAGNOSIS — K219 Gastro-esophageal reflux disease without esophagitis: Secondary | ICD-10-CM

## 2021-01-17 MED ORDER — DICYCLOMINE HCL 10 MG PO CAPS
10.0000 mg | ORAL_CAPSULE | Freq: Three times a day (TID) | ORAL | 1 refills | Status: DC
  Filled 2021-01-17: qty 100, 25d supply, fill #0
  Filled 2021-04-27: qty 120, 30d supply, fill #1

## 2021-01-17 MED ORDER — OMEPRAZOLE 40 MG PO CPDR
40.0000 mg | DELAYED_RELEASE_CAPSULE | Freq: Every day | ORAL | 3 refills | Status: DC
  Filled 2021-01-17: qty 90, 90d supply, fill #0
  Filled 2021-04-27: qty 90, 90d supply, fill #1

## 2021-01-17 MED ORDER — ATORVASTATIN CALCIUM 40 MG PO TABS
ORAL_TABLET | Freq: Every day | ORAL | 3 refills | Status: DC
  Filled 2021-01-17: qty 90, 90d supply, fill #0
  Filled 2021-04-27: qty 90, 90d supply, fill #1
  Filled 2021-08-23: qty 90, 90d supply, fill #2

## 2021-01-17 MED ORDER — OMEPRAZOLE 40 MG PO CPDR
DELAYED_RELEASE_CAPSULE | Freq: Every day | ORAL | 1 refills | Status: DC
  Filled 2021-01-17: qty 90, 90d supply, fill #0

## 2021-01-17 MED ORDER — CREON 12000-38000 UNITS PO CPEP
ORAL_CAPSULE | ORAL | 1 refills | Status: DC
  Filled 2021-01-17: qty 240, 30d supply, fill #0
  Filled 2021-06-11: qty 400, 50d supply, fill #0
  Filled 2021-08-03: qty 80, 26d supply, fill #1

## 2021-01-17 MED FILL — Medroxyprogesterone Acetate Tab 2.5 MG: ORAL | 30 days supply | Qty: 60 | Fill #0 | Status: AC

## 2021-01-17 MED FILL — Baclofen Tab 10 MG: ORAL | 30 days supply | Qty: 90 | Fill #0 | Status: AC

## 2021-01-17 MED FILL — Metoprolol Tartrate Tab 50 MG: ORAL | 90 days supply | Qty: 180 | Fill #0 | Status: AC

## 2021-01-17 NOTE — Telephone Encounter (Signed)
She wants second opinion for pelvic pain/endometrisos

## 2021-01-17 NOTE — Progress Notes (Signed)
Jonathon Bellows MD, MRCP(U.K) [address]  [place], [place] [address]  Main: [phone]  Fax: [phone]   Primary Care Physician: Delsa Grana, PA-C  Primary Gastroenterologist:  Dr. Jonathon Bellows   Diarrhea follow-up  HPI: Dana Bradley is a 50 y.o. female Summary of history : Initially referred and seen on 11/10/2019 for abdominal pain and diarrhea. She was previously a patient of [facility] gastroenterology. Known to have internal hemorrhoids per her last colonoscopy in 2016,acid reflux with esophagitis in 2016 per her endoscopy.  She has a longstanding history of irritable bowel syndrome with diarrhea. She consumes some sweet and low in her drinks daily. up to 2 or 3 bowel movements which are very runny sticky and smelly each day. Sometimes appear to be pale-colored. On  Dexilant for her acid reflux. She has generalized abdominal pain on and off crampy in nature associated with watery diarrhea and relieved after bowel movement. Stress  make it worse.  10/18/2019 CT scan of the abdomen and pelvis with contrast demonstrates mild hepatic steatosis but no acute intra-abdominal pathology.   12/07/2019: H. pylori breath test, celiac serology negative. Stool for C. difficile as well as fecal calprotectin negative. 03/20/2020 colonoscopy: No polyps seen no colitis noted endoscopically biopsies of the terminal ileum as well as random colon biopsies were completely normal.  Interval history 04/17/2020-01/17/2021  She is doing well on Creon, Bentyl and PPI for her EPI, IBS symptoms as well as GERD.  She is avoiding artificial sugars in excess for rest.  With these medications she is asymptomatic and has no complaints.  Obviously when she stops taking the medication she has recurrence of symptoms.  On the medications no abdominal pain, diarrhea, reflux symptoms  Current Outpatient Medications  Medication Sig Dispense Refill  . atorvastatin (LIPITOR) 40 MG tablet TAKE 1  TABLET (40 MG TOTAL) BY MOUTH AT BEDTIME. 90 tablet 3  . baclofen (LIORESAL) 10 MG tablet TAKE ONE TABLET BY MOUTH 3 TIMES A DAY 90 tablet 2  . butalbital-acetaminophen-caffeine (FIORICET) 50-325-40 MG tablet Take 1 tablet by mouth every 8 (eight) hours as needed for headache. 20 tablet 0  . Capsaicin-Menthol-Methyl Sal (CAPSAICIN-METHYL SAL-MENTHOL) 0.025-1-12 % CREA Apply 1 application topically 4 (four) times daily as needed. 56.6 g 0  . Cyanocobalamin (VITAMIN B-12) 500 MCG SUBL Place 1,500 mcg under the tongue daily.  150 tablet   . dihydroergotamine (MIGRANAL) 4 MG/ML nasal spray Place 1 spray into the nose as needed for migraine. Use in one nostril as directed.  No more than 4 sprays in one hour 8 mL 12  . lipase/protease/amylase (CREON) 12000-38000 units CPEP capsule Take by mouth.    . magnesium oxide (MAG-OX) 400 MG tablet Take 300 mg by mouth daily.     . medroxyPROGESTERone (PROVERA) 2.5 MG tablet TAKE TWO TABLETS BY MOUTH EVERY DAY 60 tablet 11  . Melatonin 10 MG TABS Take 10 mg by mouth at bedtime.    . metoprolol tartrate (LOPRESSOR) 50 MG tablet TAKE ONE TABLET BY MOUTH 2 TIMES A DAY 180 tablet 3  . mupirocin ointment (BACTROBAN) 2 % Apply 1 application topically 2 (two) times daily as needed. 22 g 0  . omeprazole (PRILOSEC) 40 MG capsule TAKE ONE CAPSULE BY MOUTH EVERY DAY 90 capsule 1  . ondansetron (ZOFRAN) 4 MG tablet Take 1 tablet (4 mg total) by mouth every 8 (eight) hours as needed. 20 tablet 2  . pregabalin (LYRICA) 150 MG capsule Take 1 capsule (150  mg total) by mouth every 8 (eight) hours. 90 capsule 2  . sodium chloride 0.9 % SOLN 100 mL with Eptinezumab-jjmr 100 MG/ML SOLN 100 mg Inject 100 mg into the vein every 3 (three) months. 1 each 3  . VALERIAN PO Take by mouth as needed. Makes Valerian tea about 3-4 times per week.    . Vitamin D, Ergocalciferol, (DRISDOL) 1.25 MG (50000 UNIT) CAPS capsule TAKE ONE CAPSULE BY MOUTH TWICE A WEEK FOR 12 WEEKS 24 capsule 2  .  diclofenac Sodium (VOLTAREN) 1 % GEL Apply 2 g topically 4 (four) times daily as needed. 350 g PRN  . dicyclomine (BENTYL) 10 MG capsule Take 1 capsule (10 mg total) by mouth 4 (four) times daily -  before meals and at bedtime. As needed. 360 capsule 1  . HYDROcodone-acetaminophen (NORCO) 7.5-325 MG tablet Take 1 tablet by mouth every 6 (six) hours as needed for severe pain. Must last 90 days 120 tablet 0  . medroxyPROGESTERone (PROVERA) 5 MG tablet Take 1 tablet (5 mg total) by mouth daily. 30 tablet 11   No current facility-administered medications for this visit.    Allergies as of 01/17/2021 - Review Complete 01/15/2021  Allergen Reaction Noted  . Aspirin Swelling 06/13/2008  . Cymbalta [duloxetine hcl] Other (See Comments) 09/09/2016  . Depakote [divalproex sodium] Shortness Of Breath 12/10/2012  . Gadolinium derivatives  07/20/2019  . Haloperidol Shortness Of Breath 05/15/2015  . Meperidine Nausea And Vomiting 12/10/2012  . Reglan [metoclopramide] Shortness Of Breath 09/16/2016  . Tramadol hcl Palpitations 09/09/2016  . Trazodone Shortness Of Breath 05/15/2015  . Compazine [prochlorperazine] Other (See Comments) 09/16/2016  . Meloxicam Other (See Comments) 03/17/2014  . Penicillins Rash 06/13/2008  . Tomato Hives 04/29/2016  . Other  03/17/2014  . Shellfish allergy  03/17/2014  . Shellfish-derived products Other (See Comments) 01/23/2015  . Bacitracin-neomycin-polymyxin Rash 05/15/2015  . Cephalosporins Rash 05/15/2015  . Ibuprofen Other (See Comments) and Rash 01/05/2013  . Latex Itching 03/17/2014  . Neosporin [neomycin-bacitracin zn-polymyx] Rash 12/10/2012  . Nsaids Other (See Comments) 03/17/2014  . Sulfa antibiotics Rash 03/17/2014  . Sulfonamide derivatives Rash 06/13/2008    ROS:  General: Negative for anorexia, weight loss, fever, chills, fatigue, weakness. ENT: Negative for hoarseness, difficulty swallowing , nasal congestion. CV: Negative for chest pain,  angina, palpitations, dyspnea on exertion, peripheral edema.  Respiratory: Negative for dyspnea at rest, dyspnea on exertion, cough, sputum, wheezing.  GI: See history of present illness. GU:  Negative for dysuria, hematuria, urinary incontinence, urinary frequency, nocturnal urination.  Endo: Negative for unusual weight change.    Physical Examination:   BP 130/81 (BP Location: Right Arm, Patient Position: Sitting, Cuff Size: Large)   Pulse 91   Ht 5\' 3"  (1.6 m)   Wt 219 lb 12.8 oz (99.7 kg)   BMI 38.94 kg/m   General: Well-nourished, well-developed in no acute distress.  Eyes: No icterus. Conjunctivae pink. Neuro: Alert and oriented x 3.  Grossly intact. Skin: Warm and dry, no jaundice.   Psych: Alert and cooperative, normal mood and affect.   Imaging Studies: No results found.  Assessment and Plan:   Lauri Purdum is a 50 y.o. y/o female here to follow-upfor abdominal pain and diarrhea.Likely a combination of gas bloating and irritable bowel syndrome with diarrhea. 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  She responds very well to Creon, when she goes off the medications had recurrence of symptoms.  Abdominal cramping is well controlled on Bentyl and reflux well-controlled  on Prilosec.  When she goes off the medication has significant reflux symptoms.  Plan 1.Refills of Creon, Bentyl, Prilosec provided 2.  GERD reinforced the need for lifestyle changes for acid reflux, since she has symptoms when she goes off the meds cannot take her off the meds at this point of time.  Dr Jonathon Bellows  MD,MRCP [facility]) Follow up in as needed

## 2021-01-17 NOTE — Telephone Encounter (Signed)
Referral placed.

## 2021-01-17 NOTE — Telephone Encounter (Signed)
Pt was seen at [facility] and is asking for a referral to be sent to [facility] for second opinion. She would like a female and does not care if it is a doctor or midwife.

## 2021-01-18 ENCOUNTER — Ambulatory Visit: Payer: Self-pay

## 2021-01-18 ENCOUNTER — Telehealth: Payer: Self-pay | Admitting: Pharmacist

## 2021-01-18 ENCOUNTER — Other Ambulatory Visit: Payer: Self-pay

## 2021-01-18 DIAGNOSIS — M542 Cervicalgia: Secondary | ICD-10-CM

## 2021-01-18 NOTE — Telephone Encounter (Signed)
[place] eligible till 12/29/2021.aj

## 2021-01-18 NOTE — Therapy (Signed)
[facility] [address] [place], [address] Phone: [phone]   Fax:  [phone]  Physical Therapy Treatment  Patient Details  Name: Dana Bradley MRN: [id number] Date of Birth: 23-Feb-1971 Referring Provider (PT): Delsa Grana PA   Encounter Date: 01/18/2021   PT End of Session - 01/18/21 1447    Visit Number 6    Number of Visits 17    Date for PT Re-Evaluation 02/27/21    Authorization Type [place] Medicaid    Authorization Time Period 12/04/20-02/27/21    PT Start Time 1346    PT Stop Time 1431    PT Time Calculation (min) 45 min    Activity Tolerance Patient tolerated treatment well   IMproved pain in R hip and sciatica but worsening pain in R ITB   Behavior During Therapy [facility] for tasks assessed/performed           Past Medical History:  Diagnosis Date  . Acute postoperative pain 04/07/2017  . Anxiety   . Bursitis   . Chronic fatigue 12/12/2017  . Chronic fatigue syndrome   . Colitis 2021  . Edema leg 05/02/2015  . Fibromyalgia   . GERD (gastroesophageal reflux disease)   . IBS (irritable bowel syndrome)   . Knee pain, bilateral 12/21/2008   Qualifier: Diagnosis of  By: Hassell Done FNP, Tori Milks    . Lumbar discitis   . Migraines   . Osteoarthritis   . Right hand pain 04/10/2015   [facility] has done nerve conduction studies and ruled out carpal tunnel.   . Sleep apnea   . Spinal stenosis   . SVT (supraventricular tachycardia) ([place])   . Vertigo   . Vitamin D deficiency 05/01/2016    Past Surgical History:  Procedure Laterality Date  . ABLATION     Uterine  . CARDIAC CATHETERIZATION     with ablation  . COLONOSCOPY WITH PROPOFOL N/A 05/17/2015   Procedure: COLONOSCOPY WITH PROPOFOL;  Surgeon: Manya Silvas, MD;  Location: [facility] ENDOSCOPY;  Service: Endoscopy;  Laterality: N/A;  . COLONOSCOPY WITH PROPOFOL N/A 03/20/2020   Procedure: COLONOSCOPY WITH PROPOFOL;  Surgeon: Jonathon Bellows, MD;   Location: [facility] ENDOSCOPY;  Service: Gastroenterology;  Laterality: N/A;  . ESOPHAGOGASTRODUODENOSCOPY N/A 05/17/2015   Procedure: ESOPHAGOGASTRODUODENOSCOPY (EGD);  Surgeon: Manya Silvas, MD;  Location: [facility] ENDOSCOPY;  Service: Endoscopy;  Laterality: N/A;  . KNEE ARTHROSCOPY    . spg     6/18  . Tibial Tubercle Bypass Right 1998  . TUBAL LIGATION  10/01/99    There were no vitals filed for this visit.   Subjective Assessment - 01/18/21 1349    Subjective Pt reports LBP and B LE pain. However reports cervical pain is the worst today at 7/10 NPS.    Pertinent History Fibromyalgia, chronic LBP, neck pain, hx of migranes    Currently in Pain? Yes    Pain Score 7     Pain Orientation Left    Pain Descriptors / Indicators Aching;Sharp    Pain Type Chronic pain    Pain Onset More than a month ago          Manual therapy:   Pt in supine with cervical spine slightly flexed and moist heat on L UT.   STM to bilateral upper trap, cervical paraspinals, and suboccipitals to decrease pain and muscle spasms. 10 min.     There.ex:   Supine scap retractions: 2x20   Supine cervical retractions: 2x20. No  change in LUE pain with repeated retractions.   Seated exercises    Prayer stretch on clear exercise ball for mid back mobility: 2x1 min    Forward ball roll outs: 1x20. VC for neutral neck positioning    Cervical rotation SNAGS with towel: 1x15 with R rotation. Did not perform L rotation due to referred LUE pain   B shoulder ER with RTB: 1x20 for postural stability   PT Education - 01/18/21 1352    Education Details form/technique with exercise.    Person(s) Educated Patient    Methods Explanation;Demonstration    Comprehension Verbalized understanding;Returned demonstration            PT Short Term Goals - 12/05/20 1522      PT SHORT TERM GOAL #1   Title Patient will be independent with HEP as adjunct to clinical therapy and to reduce total number of visits    Baseline HEP  given; 12/06/2020    Time 4    Period Weeks    Status New    Target Date 02/27/21             PT Long Term Goals - 12/05/20 1523      PT LONG TERM GOAL #1   Title Patient will improve her FOTO score by a significant amount as indicated by the FOTO program to indicate improvement in migrane symptoms    Baseline FOTO:    Time 8    Period Weeks    Status New      PT LONG TERM GOAL #2   Title Patient will have a total of <2 migranes per week to indicate improvement in symptoms and allow for better performance of iADLs.    Baseline 4 migranes per week    Time 8    Period Weeks    Status New      PT LONG TERM GOAL #3   Title Patient will have a worst pain of 6/10 along the cervical spine and base of the skull to indicate significant improvement in symptoms.    Baseline 9/10    Time 8    Period Weeks    Status New                 Plan - 01/18/21 1448    Clinical Impression Statement Pt has increased cervical pain today that is radiating down her shoulder and described as "wrapping around my left cheek like a claw". 7/10 pain in L neck. Focus on decreasing pain in cervical region with use of moist heat, manual tehcniuqes and therapeutic exercise. Skin assessed prior and after application of heat with normal redness from local vasodilation response. Pt reports relief in cervical region however has in increase in pain in her middle and low back. Pt can benefit from further skilled PT services to improve pain and function for ADL completion.    Personal Factors and Comorbidities Comorbidity 3+    Comorbidities Fibromyalgia, OA, Chronic migranes, neck pain, shoulder pain, ext.    Examination-Activity Limitations Lift    Examination-Participation Restrictions Community Activity;Cleaning;Volunteer    Stability/Clinical Decision Making Unstable/Unpredictable    Rehab Potential Fair    PT Frequency 2x / week    PT Duration 8 weeks    PT Treatment/Interventions Electrical  Stimulation;Moist Heat;Traction;Cryotherapy;Iontophoresis 4mg /ml Dexamethasone;Therapeutic activities;Therapeutic exercise;Balance training;Neuromuscular re-education;Functional mobility training;Patient/family education;Manual techniques;Dry needling;Passive range of motion;Taping;Joint Manipulations;Spinal Manipulations    PT Next Visit Plan Progress strengthening and AROM Ther ex    PT Home Exercise Plan See  education sections    Consulted and Agree with Plan of Care Patient           Patient will benefit from skilled therapeutic intervention in order to improve the following deficits and impairments:  Pain,Decreased coordination,Increased muscle spasms,Postural dysfunction,Decreased endurance,Decreased range of motion,Decreased strength,Hypomobility,Impaired UE functional use,Decreased activity tolerance  Visit Diagnosis: Cervicalgia     Problem List Patient Active Problem List   Diagnosis Date Noted  . Abnormal MRI, lumbar spine (03/16/2020) 11/09/2020  . Chronic midline thoracic back pain 11/09/2020  . Abnormal MRI, thoracic spine (08/02/2019) 11/09/2020  . Thoracic spinal stenosis (T7-8) 11/09/2020  . Prolapse of thoracic disc with radiculopathy (T7-8) 11/09/2020  . Spasm of muscle of lower back 11/09/2020  . Vertigo, benign paroxysmal, unspecified laterality 11/09/2020  . Uncomplicated opioid dependence ([place]) 11/08/2020  . Hyperalgesia 03/07/2020  . Neurogenic urinary incontinence 03/01/2020  . Other intervertebral disc degeneration, lumbar region 03/01/2020  . Spinal stenosis of lumbosacral region 03/01/2020  . Pharmacologic therapy 02/06/2020  . Lumbar radiculitis (Right) 09/21/2019  . Chronic lower extremity pain (Bilateral) 09/06/2019  . Intractable migraine with aura without status migrainosus 08/10/2019  . Other specified dorsopathies, sacral and sacrococcygeal region 08/03/2019  . Latex precautions, history of latex allergy 08/03/2019  . History of allergy to  radiographic contrast media 08/03/2019  . DDD (degenerative disc disease), cervical 07/21/2019  . Cervical facet syndrome (Bilateral) (L>R) 07/21/2019  . DDD (degenerative disc disease), thoracic 07/21/2019  . Osteoarthritis of hip (Left) 07/21/2019  . Chronic groin pain (Bilateral) (L>R) 07/21/2019  . Chronic hip pain (Bilateral) (L>R) 07/21/2019  . Somatic dysfunction of sacroiliac joint (Bilateral) 07/21/2019  . Migraine with aura and with status migrainosus, not intractable 04/06/2019  . Cervico-occipital neuralgia (Left) 04/06/2019  . Weakness of leg (Left) 04/05/2019  . Difficulty walking 04/05/2019  . Chronic migraine without aura, with intractable migraine, so stated, with status migrainosus 12/27/2018  . Malar rash 09/04/2018  . Trigger point of neck (Left) 03/19/2018  . Occipital headache 12/25/2017  . Chronic fatigue syndrome with fibromyalgia 12/12/2017  . Trigger point of shoulder region (Left) 11/17/2017  . Myofascial pain syndrome (Left) (trapezius muscle) 07/22/2017  . Lumbar L1-2 disc protrusion (Right) 04/07/2017  . Muscle spasticity 04/01/2017  . Osteoarthritis of shoulder (Bilateral) 04/01/2017  . Lumbar spondylosis 01/06/2017  . Chronic hip pain (Left) 12/24/2016  . Chronic sacroiliac joint pain (Left) 12/24/2016  . Lumbar facet syndrome (Bilateral) (L>R) 12/24/2016  . Lumbar radiculitis (Left) 12/24/2016  . Hypertriglyceridemia 11/27/2016  . History of vasovagal episode 10/30/2016  . Cervicogenic headache 09/09/2016  . Medication monitoring encounter 08/29/2016  . Controlled substance agreement signed 08/28/2016  . Plantar fasciitis of left foot 08/28/2016  . Vitamin B12 deficiency 08/28/2016  . Hyperlipidemia 08/28/2016  . Nephrolithiasis 08/12/2016  . Chronic pain syndrome 08/07/2016  . Long term prescription opiate use 08/07/2016  . Opiate use 08/07/2016  . Long term prescription benzodiazepine use 08/07/2016  . Neurogenic pain 08/07/2016  . Chronic  low back pain (1ry area of Pain) (Bilateral) (R>L) (midline) 08/07/2016  . Chronic upper back pain (2ry area of Pain) (Bilateral) (L>R) 08/07/2016  . Chronic abdominal pain (Right lower quadrant) 08/07/2016  . Thoracic radiculitis (Bilateral: T10, T11) 08/07/2016  . Chronic occipital neuralgia (3ry area of Pain) (Bilateral) (L>R) 08/07/2016  . Chronic neck pain 08/07/2016  . Chronic cervical radicular pain (Bilateral) (L>R) 08/07/2016  . Chronic shoulder blade pain (Bilateral) (L>R) 08/07/2016  . Chronic upper extremity pain (Bilateral) (R>L) 08/07/2016  . Chronic  knee pain (Bilateral) (R>L) 08/07/2016  . Chronic ankle pain (Bilateral) 08/07/2016  . Cervical spondylosis with myelopathy and radiculopathy 08/07/2016  . Panic disorder with agoraphobia 05/29/2016  . Depression, unspecified depression type 05/29/2016  . Atypical lymphocytosis 05/01/2016  . Vitamin D insufficiency 05/01/2016  . Chronic lower extremity cramps (Bilateral) (R>L) 04/29/2016  . Obesity 04/29/2016  . GAD (generalized anxiety disorder) 04/29/2016  . Fatigue 04/29/2016  . Insomnia 07/12/2015  . Migraine without aura and with status migrainosus, not intractable 07/12/2015  . Chronic superficial gastritis 06/02/2015  . Chronic pain of multiple joints 05/15/2015  . Bilateral leg edema 05/02/2015  . Paroxysmal supraventricular tachycardia ([place]) 04/17/2015  . Exertional shortness of breath 04/17/2015  . Bright red rectal bleeding 04/06/2015  . DDD (degenerative disc disease), lumbosacral 01/24/2014  . DDD (degenerative disc disease), lumbar 01/24/2014  . Cervico-occipital neuralgia 12/29/2013  . Fibromyalgia 12/29/2013  . Migraine headache 12/29/2013  . Menorrhagia 12/10/2012  . Depression, major, recurrent, in remission ([place]) 01/12/2009  . Chest pain 01/12/2009  . Hypertension, benign essential, goal below 140/90 06/23/2008  . History of PSVT (paroxysmal supraventricular tachycardia) 06/17/2008  . Obstructive  sleep apnea 06/17/2008  . GERD 06/13/2008    Salem Caster. Fairly IV, PT, DPT Physical Therapist- [facility]  01/18/2021, 2:53 PM  [facility] [address] [place], [address] Phone: [phone]   Fax:  [phone]  Name: Dana Bradley MRN: [id number] Date of Birth: 1971-02-04

## 2021-01-19 ENCOUNTER — Other Ambulatory Visit: Payer: Self-pay

## 2021-01-19 ENCOUNTER — Encounter: Payer: Self-pay | Attending: Physical Medicine and Rehabilitation | Admitting: Physical Medicine and Rehabilitation

## 2021-01-19 ENCOUNTER — Encounter: Payer: Self-pay | Admitting: Physical Medicine and Rehabilitation

## 2021-01-19 VITALS — BP 119/78 | HR 87 | Temp 98.9°F | Ht 63.0 in | Wt 217.4 lb

## 2021-01-19 DIAGNOSIS — G4701 Insomnia due to medical condition: Secondary | ICD-10-CM | POA: Insufficient documentation

## 2021-01-19 DIAGNOSIS — G43001 Migraine without aura, not intractable, with status migrainosus: Secondary | ICD-10-CM | POA: Insufficient documentation

## 2021-01-19 MED ORDER — BUTALBITAL-APAP-CAFFEINE 50-325-40 MG PO TABS: 1.0000 | ORAL_TABLET | Freq: Three times a day (TID) | ORAL | 0 refills | Status: DC | PRN

## 2021-01-19 MED ORDER — AMITRIPTYLINE HCL 10 MG PO TABS
10.0000 mg | ORAL_TABLET | Freq: Every day | ORAL | 1 refills | Status: DC
  Filled 2021-01-19: qty 30, 30d supply, fill #0
  Filled 2021-02-28: qty 30, 30d supply, fill #1

## 2021-01-19 MED ORDER — VITAMIN B-6 50 MG PO TABS
50.0000 mg | ORAL_TABLET | Freq: Every day | ORAL | 1 refills | Status: DC
  Filled 2021-01-19: qty 30, 30d supply, fill #0

## 2021-01-19 MED ORDER — ONDANSETRON HCL 4 MG PO TABS
4.0000 mg | ORAL_TABLET | Freq: Three times a day (TID) | ORAL | 2 refills | Status: DC | PRN
  Filled 2021-01-19: qty 20, 7d supply, fill #0

## 2021-01-19 NOTE — Progress Notes (Addendum)
Subjective:    Patient ID: Dana Bradley, female    DOB: [birth year], 50 y.o.   MRN: [id number]  HPI   1) Chronic Migraine:  -Mrs. Gallaher [place] a 50 year old woman who presents for f/u of her chronic migraine.  Terrible stomach spasms and diarrhea. 12 hours later debilitating migraine. It usually starts in the afternoon. She feels that this may be part of her prodrome. She also has terrible vertigo that is getting worse. The Fiorcet helps a lot, the Migronal helps with the frontal headache. These make the pain bearable.  -having migraines close to every 24H -she is not functional -she knows it is stress related -she feels a pain radiating down her arm as well. It also runs into her head and into her face.  -Sometimes her whole left side is fizzy.  -she has tried Amitriptyline.  -she needs a refill of her Fioricet and nausea  2) Vertigo -Symptoms are worsening.  -She feels that this may be leading up to the vertigo.  -She is interested in vestibular therapy.   3) Cervical myofasical pain syndrome: -Muscles of neck are very tight, pain is worse on the left side.  -She found trigger point injections   4) Insomnia: she sleeps in chunks -she has not tried Amitriptyline in years.   Mrs. Vandalen is a 50 year old woman who presents for follow-up of her chronic migraine. Her average pain is 6/10 and pain right now is 6/10. Her pain is intermittent, sharp, burning, stabbing, tingling, and aching.   She recently had 7 days without migraine. She is not sure what was associated with this. She had a pretty terrible bounceback.  She picked up the Migranal nasal spray. Our CMA explained how it works.   Her pain doctor is going to stop prescribing the Lyrica and she needs to find a new doctor to prescribe this.    Pain Inventory Average Pain 6 Pain Right Now 7 My pain is intermittent, sharp, burning, stabbing, tingling and aching  In the last 24 hours, has pain interfered with the  following? General activity 8 Relation with others 6 Enjoyment of life 7 What TIME of day is your pain at its worst? evening and night Sleep (in general) Poor  Pain is worse with: bending Pain improves with: medication and TENS   Relief with medication 7  Family History  Problem Relation Age of Onset  . Depression Mother   . Hypertension Mother   . Cancer Mother        Skin  . Hyperlipidemia Mother   . Anxiety disorder Mother   . Migraines Mother   . Alcohol abuse Father   . Depression Father   . Stroke Father   . Heart disease Father   . Hypertension Father   . Anxiety disorder Father   . Depression Sister   . Hyperlipidemia Sister   . Diabetes Sister   . Hypertension Sister   . Polycystic ovary syndrome Sister   . Bipolar disorder Sister   . Anxiety disorder Sister   . Migraines Sister   . Cancer Maternal Grandmother 96       Breast  . Thyroid disease Maternal Grandmother   . Arthritis Maternal Grandmother   . Hyperlipidemia Maternal Grandmother   . Depression Sister   . Hypertension Sister   . Anxiety disorder Sister   . Migraines Sister   . Alzheimer's disease Other   . Aneurysm Maternal Grandfather   . Hypertension Maternal Grandfather   .  Heart disease Maternal Grandfather   . Alzheimer's disease Paternal Grandmother   . Heart attack Paternal Grandfather   . Hypertension Paternal Grandfather   . COPD Paternal Grandfather   . Heart disease Paternal Grandfather   . Migraines Son   . Migraines Daughter   . Migraines Daughter   . Bladder Cancer Neg Hx   . Kidney cancer Neg Hx    Social History   Socioeconomic History  . Marital status: Married    Spouse name: brian  . Number of children: 3  . Years of education: Not on file  . Highest education level: Associate degree: academic program  Occupational History  . Occupation: disbled    Comment: not able  Tobacco Use  . Smoking status: Former Smoker    Packs/day: 4.00    Years: 3.00    Pack  years: 12.00    Types: Cigarettes    Quit date: 12/10/1992    Years since quitting: 28.1  . Smokeless tobacco: Never Used  . Tobacco comment: quit 25 years ago  Vaping Use  . Vaping Use: Never used  Substance and Sexual Activity  . Alcohol use: No    Alcohol/week: 0.0 standard drinks  . Drug use: No  . Sexual activity: Not Currently    Partners: Male    Birth control/protection: Surgical  Other Topics Concern  . Not on file  Social History Narrative   Lives at home with her husband and 2 of her children   Right handed   Caffeine: 0-2 cups daily   Social Determinants of Health   Financial Resource Strain: Not on file  Food Insecurity: Not on file  Transportation Needs: Not on file  Physical Activity: Not on file  Stress: Not on file  Social Connections: Not on file   Past Surgical History:  Procedure Laterality Date  . ABLATION     Uterine  . CARDIAC CATHETERIZATION     with ablation  . COLONOSCOPY WITH PROPOFOL N/A 05/17/2015   Procedure: COLONOSCOPY WITH PROPOFOL;  Surgeon: Manya Silvas, MD;  Location: [facility] ENDOSCOPY;  Service: Endoscopy;  Laterality: N/A;  . COLONOSCOPY WITH PROPOFOL N/A 03/20/2020   Procedure: COLONOSCOPY WITH PROPOFOL;  Surgeon: Jonathon Bellows, MD;  Location: [facility] ENDOSCOPY;  Service: Gastroenterology;  Laterality: N/A;  . ESOPHAGOGASTRODUODENOSCOPY N/A 05/17/2015   Procedure: ESOPHAGOGASTRODUODENOSCOPY (EGD);  Surgeon: Manya Silvas, MD;  Location: [facility] ENDOSCOPY;  Service: Endoscopy;  Laterality: N/A;  . KNEE ARTHROSCOPY    . spg     6/18  . Tibial Tubercle Bypass Right 1998  . TUBAL LIGATION  10/01/99   Past Surgical History:  Procedure Laterality Date  . ABLATION     Uterine  . CARDIAC CATHETERIZATION     with ablation  . COLONOSCOPY WITH PROPOFOL N/A 05/17/2015   Procedure: COLONOSCOPY WITH PROPOFOL;  Surgeon: Manya Silvas, MD;  Location: [facility] ENDOSCOPY;  Service: Endoscopy;  Laterality: N/A;  . COLONOSCOPY WITH PROPOFOL N/A  03/20/2020   Procedure: COLONOSCOPY WITH PROPOFOL;  Surgeon: Jonathon Bellows, MD;  Location: [facility] ENDOSCOPY;  Service: Gastroenterology;  Laterality: N/A;  . ESOPHAGOGASTRODUODENOSCOPY N/A 05/17/2015   Procedure: ESOPHAGOGASTRODUODENOSCOPY (EGD);  Surgeon: Manya Silvas, MD;  Location: [facility] ENDOSCOPY;  Service: Endoscopy;  Laterality: N/A;  . KNEE ARTHROSCOPY    . spg     6/18  . Tibial Tubercle Bypass Right 1998  . TUBAL LIGATION  10/01/99   Past Medical History:  Diagnosis Date  . Acute postoperative pain 04/07/2017  . Anxiety   .  Bursitis   . Chronic fatigue 12/12/2017  . Chronic fatigue syndrome   . Colitis 2021  . Edema leg 05/02/2015  . Fibromyalgia   . GERD (gastroesophageal reflux disease)   . IBS (irritable bowel syndrome)   . Knee pain, bilateral 12/21/2008   Qualifier: Diagnosis of  By: Hassell Done FNP, Tori Milks    . Lumbar discitis   . Migraines   . Osteoarthritis   . Right hand pain 04/10/2015   [facility] has done nerve conduction studies and ruled out carpal tunnel.   . Sleep apnea   . Spinal stenosis   . SVT (supraventricular tachycardia) ([place])   . Vertigo   . Vitamin D deficiency 05/01/2016   There were no vitals taken for this visit.  Opioid Risk Score:   Fall Risk Score:  `1  Depression screen PHQ 2/9  Depression screen [facility] 2/9 07/03/2020 05/24/2020 04/12/2020 03/13/2020 03/01/2020 01/24/2020 09/28/2019  Decreased Interest 1 0 0 1 0 0 0  Down, Depressed, Hopeless 1 0 0 0 0 0 0  PHQ - 2 Score 2 0 0 1 0 0 0  Altered sleeping 3 - - - - 0 -  Tired, decreased energy 3 - - - - 0 -  Change in appetite 2 - - - - 0 -  Feeling bad or failure about yourself  1 - - - - 0 -  Trouble concentrating 2 - - - - 0 -  Moving slowly or fidgety/restless 2 - - - - 0 -  Suicidal thoughts 0 - - - - 0 -  PHQ-9 Score - - - - - 0 -  Difficult doing work/chores Extremely dIfficult - - - - Not difficult at all -  Some recent data might be hidden   Review of Systems     Objective:    Physical Exam Gen: no distress, normal appearing HEENT: oral mucosa pink and moist, NCAT Cardio: Reg rate Chest: normal effort, normal rate of breathing Abd: soft, non-distended Ext: no edema Psych: pleasant, normal affect Skin: intact Neuro: Alert and oriented x3 Musculoskeletal: Severe tightness in her left upper back/neck muscles. Limited range of motion in left arm due to shooting pain     Assessment & Plan:  Mrs. Ast is a 50 year old woman with chronic intractable migraines s/p numerous treatments, severe fibromyalgia, and IBS, and nausea, and vertigo.    Vertigo: In the future, restart for cervical myofascial pain syndrome: myofascial release, postural correction, stretching and strengthening of the muscles of the neck and upper back, development of HEP. Continue heating pads to muscles of upper back and neck. She is doing HEP.   Migraines: Continue Fioricet, which is one of the medications that helps her. Refilled today. She takes this at least once every time she gets a migraine. Advised to use upon migraine onset and to not use more frequently than q6H during migraine. Refilled Zofran for nausea last week, and this has been helping her. Continue metoprolol which can be helpful in migraine prophylaxis (HR is well controlled). Prescribed ergot nasal spray to try upon migraine initiation- advised no more frequently than 4 sprays per hour (still awaiting on prior auth). Discussed avoiding foods that may trigger migraines.  -Discussed that I can provide refill for her Lyrica when she needs it.  -When she gets the migraines her loss of words is getting worse.  -Ordered Vyepti- she will find out cost from her pharmacy. She will bring paperwork for this next visit.  -no benefits  from Botox.  -continue to track migraines.  -Continue Baclofen for pain relief. Minimize use of Hydrocodone. She is only taking Lyrica once per day to make it last. Continue Migranal which is helping. -Discussed  that Mrs. Kam's greatest source of happiness is her family. This community will be essential in helping her recover from her chronic pain and to increase her daily activity.Her daughter is also suffering from similar migraines unfortunately.  -Continue ginger, herbs, turmeric, blueberries, eating real food. Continue cutting down sugar. Using local honey is a great alternative. Discussed that exercise is one of the most effective treatments for fibromyalgia. This will also help with her obesity. Made goal with Mrs. Stigall to walk outside her home at least once per day, and to garden at least once per day (her favorite activity). Can use elliptical which she has at home on rainy days. The heat has been oppressive and so she has been trying to do the latter more.   Cervical facet arthrosis: Cervical XR normal- discussed results with patient. Pain is worse on left side.   Stress: -Her daughter was recently diagnosed with sarcoma and this is a great source of stress and fear to Mrs. [name]. It has been a turbulent time for her and this has understandably worsened her symptoms. Discussed benefits of gratitude journaling and she plans to try this.   Insomnia:  -Amitriptyline 10mg  HS prescribed  Nausea:  -refilled zofran -start B6   All questions were encouraged and answered. Follow up with mein 6-8 weeks.   40 minutes spent in discussion of her migraines, nausea, insomnia, stress regarding daughter's recent stage III sarcoma diagnosis, quality of life, neck and arm pain, treatment options

## 2021-01-19 NOTE — Addendum Note (Signed)
Addended by: Izora Ribas on: 01/19/2021 12:36 PM   Modules accepted: Orders

## 2021-01-19 NOTE — Addendum Note (Signed)
Addended by: Izora Ribas on: 01/19/2021 01:28 PM   Modules accepted: Orders, Level of Service

## 2021-01-22 ENCOUNTER — Other Ambulatory Visit: Payer: Self-pay

## 2021-01-22 ENCOUNTER — Ambulatory Visit: Payer: Self-pay

## 2021-01-24 ENCOUNTER — Other Ambulatory Visit: Payer: Self-pay

## 2021-01-24 ENCOUNTER — Ambulatory Visit: Payer: Self-pay

## 2021-01-24 DIAGNOSIS — M542 Cervicalgia: Secondary | ICD-10-CM

## 2021-01-24 NOTE — Therapy (Signed)
[place] [facility] [address], [place], [address] Phone: [phone]   Fax:  [phone]  Physical Therapy Treatment  Patient Details  Name: Tangela Uccello MRN: [id number] Date of Birth: Nov 07, 1970 Referring Provider (PT): Delsa Grana PA   Encounter Date: 01/24/2021   PT End of Session - 01/24/21 1528    Visit Number 7    Number of Visits 17    Date for PT Re-Evaluation 02/27/21    Authorization Type [place] Medicaid    Authorization Time Period 12/04/20-02/27/21    PT Start Time 1520    PT Stop Time 1600    PT Time Calculation (min) 40 min    Activity Tolerance Patient tolerated treatment well   IMproved pain in R hip and sciatica but worsening pain in R ITB   Behavior During Therapy [facility] for tasks assessed/performed           Past Medical History:  Diagnosis Date  . Acute postoperative pain 04/07/2017  . Anxiety   . Bursitis   . Chronic fatigue 12/12/2017  . Chronic fatigue syndrome   . Colitis 2021  . Edema leg 05/02/2015  . Fibromyalgia   . GERD (gastroesophageal reflux disease)   . IBS (irritable bowel syndrome)   . Knee pain, bilateral 12/21/2008   Qualifier: Diagnosis of  By: Hassell Done FNP, Tori Milks    . Lumbar discitis   . Migraines   . Osteoarthritis   . Right hand pain 04/10/2015   [facility] has done nerve conduction studies and ruled out carpal tunnel.   . Sleep apnea   . Spinal stenosis   . SVT (supraventricular tachycardia) ([place])   . Vertigo   . Vitamin D deficiency 05/01/2016    Past Surgical History:  Procedure Laterality Date  . ABLATION     Uterine  . CARDIAC CATHETERIZATION     with ablation  . COLONOSCOPY WITH PROPOFOL N/A 05/17/2015   Procedure: COLONOSCOPY WITH PROPOFOL;  Surgeon: Manya Silvas, MD;  Location: [facility] ENDOSCOPY;  Service: Endoscopy;  Laterality: N/A;  . COLONOSCOPY WITH PROPOFOL N/A 03/20/2020   Procedure: COLONOSCOPY WITH PROPOFOL;  Surgeon: Jonathon Bellows, MD;   Location: [facility] ENDOSCOPY;  Service: Gastroenterology;  Laterality: N/A;  . ESOPHAGOGASTRODUODENOSCOPY N/A 05/17/2015   Procedure: ESOPHAGOGASTRODUODENOSCOPY (EGD);  Surgeon: Manya Silvas, MD;  Location: [facility] ENDOSCOPY;  Service: Endoscopy;  Laterality: N/A;  . KNEE ARTHROSCOPY    . spg     6/18  . Tibial Tubercle Bypass Right 1998  . TUBAL LIGATION  10/01/99    There were no vitals filed for this visit.   Subjective Assessment - 01/24/21 1526    Subjective Pt reports a having a bad migraine on Thursday/Friday and 6/10 pain NPS in R/L neck currently. With her migraine pt could not find relief and was close to going to ED for the intense pain.    Pertinent History Fibromyalgia, chronic LBP, neck pain, hx of migranes    Currently in Pain? Yes    Pain Score 6     Pain Location Neck    Pain Orientation Left;Right    Pain Descriptors / Indicators Aching;Sharp    Pain Type Chronic pain    Pain Onset More than a month ago    Pain Frequency Constant          there.ex:   Nu-Step L2 for 5 min for warm up. Not billed.     Seated exercises    UT stretch bilat:  3x30 sec/direction    Scap retractions with shoulder ER: 2x20    Cervical retractions: 2x15    Chest press with PVC pipe: 1x15    Thoracic extension: x10   Thoracic rotation R/L: x10/direction   Hook lying exercises:    Chest press with PVC pipe: x120    AAROM with PVC pipe shoulder flexion: x20    T's with elbows extended (middle trap/post delt) with RTB: 2x12. Supinated grip.   Pt relies on mod VC's and TC's for form/technique to perform exercises within tolerance. Subtle improvement in symptoms on areas targeted during treatment but reports of other MSK pain in other bodily regions.   PT Education - 01/24/21 1528    Education Details form/technique with exercise.    Person(s) Educated Patient    Methods Explanation;Demonstration;Tactile cues;Verbal cues    Comprehension Verbalized understanding;Returned demonstration             PT Short Term Goals - 12/05/20 1522      PT SHORT TERM GOAL #1   Title Patient will be independent with HEP as adjunct to clinical therapy and to reduce total number of visits    Baseline HEP given; 12/06/2020    Time 4    Period Weeks    Status New    Target Date 02/27/21             PT Long Term Goals - 12/05/20 1523      PT LONG TERM GOAL #1   Title Patient will improve her FOTO score by a significant amount as indicated by the FOTO program to indicate improvement in migrane symptoms    Baseline FOTO:    Time 8    Period Weeks    Status New      PT LONG TERM GOAL #2   Title Patient will have a total of <2 migranes per week to indicate improvement in symptoms and allow for better performance of iADLs.    Baseline 4 migranes per week    Time 8    Period Weeks    Status New      PT LONG TERM GOAL #3   Title Patient will have a worst pain of 6/10 along the cervical spine and base of the skull to indicate significant improvement in symptoms.    Baseline 9/10    Time 8    Period Weeks    Status New                 Plan - 01/24/21 1756    Clinical Impression Statement Pt has temporary improvement/pain relief with exercises during session but aggravation in other musculoskeletal symptoms throughout body.  Attempts in addressing gentle mobility, strengthening, and AROM exercises for cervical/thoracic spine and shoulders to improve pain. Pt can benefit from further skilled PT services in attempts to improve pain and mobility with ADL completion.    Personal Factors and Comorbidities Comorbidity 3+    Comorbidities Fibromyalgia, OA, Chronic migranes, neck pain, shoulder pain, ext.    Examination-Activity Limitations Lift    Examination-Participation Restrictions Community Activity;Cleaning;Volunteer    Stability/Clinical Decision Making Unstable/Unpredictable    Rehab Potential Fair    PT Frequency 2x / week    PT Duration 8 weeks    PT  Treatment/Interventions Electrical Stimulation;Moist Heat;Traction;Cryotherapy;Iontophoresis 4mg /ml Dexamethasone;Therapeutic activities;Therapeutic exercise;Balance training;Neuromuscular re-education;Functional mobility training;Patient/family education;Manual techniques;Dry needling;Passive range of motion;Taping;Joint Manipulations;Spinal Manipulations    PT Next Visit Plan Progress strengthening and AROM Ther ex    PT Home Exercise Plan  See education sections    Consulted and Agree with Plan of Care Patient           Patient will benefit from skilled therapeutic intervention in order to improve the following deficits and impairments:  Pain,Decreased coordination,Increased muscle spasms,Postural dysfunction,Decreased endurance,Decreased range of motion,Decreased strength,Hypomobility,Impaired UE functional use,Decreased activity tolerance  Visit Diagnosis: Cervicalgia     Problem List Patient Active Problem List   Diagnosis Date Noted  . Abnormal MRI, lumbar spine (03/16/2020) 11/09/2020  . Chronic midline thoracic back pain 11/09/2020  . Abnormal MRI, thoracic spine (08/02/2019) 11/09/2020  . Thoracic spinal stenosis (T7-8) 11/09/2020  . Prolapse of thoracic disc with radiculopathy (T7-8) 11/09/2020  . Spasm of muscle of lower back 11/09/2020  . Vertigo, benign paroxysmal, unspecified laterality 11/09/2020  . Uncomplicated opioid dependence ([place]) 11/08/2020  . Hyperalgesia 03/07/2020  . Neurogenic urinary incontinence 03/01/2020  . Other intervertebral disc degeneration, lumbar region 03/01/2020  . Spinal stenosis of lumbosacral region 03/01/2020  . Pharmacologic therapy 02/06/2020  . Lumbar radiculitis (Right) 09/21/2019  . Chronic lower extremity pain (Bilateral) 09/06/2019  . Intractable migraine with aura without status migrainosus 08/10/2019  . Other specified dorsopathies, sacral and sacrococcygeal region 08/03/2019  . Latex precautions, history of latex allergy  08/03/2019  . History of allergy to radiographic contrast media 08/03/2019  . DDD (degenerative disc disease), cervical 07/21/2019  . Cervical facet syndrome (Bilateral) (L>R) 07/21/2019  . DDD (degenerative disc disease), thoracic 07/21/2019  . Osteoarthritis of hip (Left) 07/21/2019  . Chronic groin pain (Bilateral) (L>R) 07/21/2019  . Chronic hip pain (Bilateral) (L>R) 07/21/2019  . Somatic dysfunction of sacroiliac joint (Bilateral) 07/21/2019  . Migraine with aura and with status migrainosus, not intractable 04/06/2019  . Cervico-occipital neuralgia (Left) 04/06/2019  . Weakness of leg (Left) 04/05/2019  . Difficulty walking 04/05/2019  . Chronic migraine without aura, with intractable migraine, so stated, with status migrainosus 12/27/2018  . Malar rash 09/04/2018  . Trigger point of neck (Left) 03/19/2018  . Occipital headache 12/25/2017  . Chronic fatigue syndrome with fibromyalgia 12/12/2017  . Trigger point of shoulder region (Left) 11/17/2017  . Myofascial pain syndrome (Left) (trapezius muscle) 07/22/2017  . Lumbar L1-2 disc protrusion (Right) 04/07/2017  . Muscle spasticity 04/01/2017  . Osteoarthritis of shoulder (Bilateral) 04/01/2017  . Lumbar spondylosis 01/06/2017  . Chronic hip pain (Left) 12/24/2016  . Chronic sacroiliac joint pain (Left) 12/24/2016  . Lumbar facet syndrome (Bilateral) (L>R) 12/24/2016  . Lumbar radiculitis (Left) 12/24/2016  . Hypertriglyceridemia 11/27/2016  . History of vasovagal episode 10/30/2016  . Cervicogenic headache 09/09/2016  . Medication monitoring encounter 08/29/2016  . Controlled substance agreement signed 08/28/2016  . Plantar fasciitis of left foot 08/28/2016  . Vitamin B12 deficiency 08/28/2016  . Hyperlipidemia 08/28/2016  . Nephrolithiasis 08/12/2016  . Chronic pain syndrome 08/07/2016  . Long term prescription opiate use 08/07/2016  . Opiate use 08/07/2016  . Long term prescription benzodiazepine use 08/07/2016  .  Neurogenic pain 08/07/2016  . Chronic low back pain (1ry area of Pain) (Bilateral) (R>L) (midline) 08/07/2016  . Chronic upper back pain (2ry area of Pain) (Bilateral) (L>R) 08/07/2016  . Chronic abdominal pain (Right lower quadrant) 08/07/2016  . Thoracic radiculitis (Bilateral: T10, T11) 08/07/2016  . Chronic occipital neuralgia (3ry area of Pain) (Bilateral) (L>R) 08/07/2016  . Chronic neck pain 08/07/2016  . Chronic cervical radicular pain (Bilateral) (L>R) 08/07/2016  . Chronic shoulder blade pain (Bilateral) (L>R) 08/07/2016  . Chronic upper extremity pain (Bilateral) (R>L) 08/07/2016  .  Chronic knee pain (Bilateral) (R>L) 08/07/2016  . Chronic ankle pain (Bilateral) 08/07/2016  . Cervical spondylosis with myelopathy and radiculopathy 08/07/2016  . Panic disorder with agoraphobia 05/29/2016  . Depression, unspecified depression type 05/29/2016  . Atypical lymphocytosis 05/01/2016  . Vitamin D insufficiency 05/01/2016  . Chronic lower extremity cramps (Bilateral) (R>L) 04/29/2016  . Obesity 04/29/2016  . GAD (generalized anxiety disorder) 04/29/2016  . Fatigue 04/29/2016  . Insomnia 07/12/2015  . Migraine without aura and with status migrainosus, not intractable 07/12/2015  . Chronic superficial gastritis 06/02/2015  . Chronic pain of multiple joints 05/15/2015  . Bilateral leg edema 05/02/2015  . Paroxysmal supraventricular tachycardia ([place]) 04/17/2015  . Exertional shortness of breath 04/17/2015  . Bright red rectal bleeding 04/06/2015  . DDD (degenerative disc disease), lumbosacral 01/24/2014  . DDD (degenerative disc disease), lumbar 01/24/2014  . Cervico-occipital neuralgia 12/29/2013  . Fibromyalgia 12/29/2013  . Migraine headache 12/29/2013  . Menorrhagia 12/10/2012  . Depression, major, recurrent, in remission ([place]) 01/12/2009  . Chest pain 01/12/2009  . Hypertension, benign essential, goal below 140/90 06/23/2008  . History of PSVT (paroxysmal supraventricular  tachycardia) 06/17/2008  . Obstructive sleep apnea 06/17/2008  . GERD 06/13/2008    Salem Caster. Fairly IV, PT, DPT Physical Therapist- [facility]  01/24/2021, 6:01 PM  [place] [facility] [address], [place], [address] Phone: [phone]   Fax:  [phone]  Name: Angeline Trick MRN: [id number] Date of Birth: 03/22/1971

## 2021-01-25 ENCOUNTER — Telehealth: Payer: Self-pay | Admitting: Pharmacist

## 2021-01-25 ENCOUNTER — Ambulatory Visit: Payer: Self-pay | Admitting: Physical Medicine and Rehabilitation

## 2021-01-25 NOTE — Telephone Encounter (Signed)
01/25/2021 9:14:05 AM - Creon dose change 12000-38000 script to dr  -- Elmer Picker - Thursday, January 25, 2021 9:12 AM --Printed  Dose Change script and sending to Ginger @ [facility] for provider to sign & return,  Patient will need to renewal with [employer] for Creon, also sending provider portion of [employer] application.

## 2021-01-31 ENCOUNTER — Ambulatory Visit: Payer: Self-pay

## 2021-01-31 ENCOUNTER — Telehealth: Payer: Self-pay | Admitting: Pharmacist

## 2021-01-31 NOTE — Telephone Encounter (Signed)
01/31/2021 2:41:06 PM - Creon 12,000 faxed to [facility] - Wednesday, Jan 31, 2021 2:39 PM --Dana Bradley dose change Creon 12,000 Take 2 tablets with the first bite of each meal and 1 tablet before snacks #8 bottles.  Dose change and renewal.

## 2021-02-02 ENCOUNTER — Other Ambulatory Visit: Payer: Self-pay | Admitting: Physical Medicine and Rehabilitation

## 2021-02-02 ENCOUNTER — Other Ambulatory Visit: Payer: Self-pay

## 2021-02-02 MED ORDER — NURTEC 75 MG PO TBDP
1.0000 | ORAL_TABLET | ORAL | 3 refills | Status: DC
  Filled 2021-02-02: qty 30, fill #0

## 2021-02-04 NOTE — Progress Notes (Signed)
PROVIDER NOTE: Information contained herein reflects review and annotations entered in association with encounter. Interpretation of such information and data should be left to medically-trained personnel. Information provided to patient can be located elsewhere in the medical record under "Patient Instructions". Document created using STT-dictation technology, any transcriptional errors that may result from process are unintentional.    Patient: Dana Bradley  Service Category: E/M  Provider: Gaspar Cola, MD  DOB: May 04, 1971  DOS: 02/05/2021  Specialty: Interventional Pain Management  MRN: [id number]  Setting: Ambulatory outpatient  PCP: Delsa Grana, PA-C  Type: Established Patient    Referring Provider: Delsa Grana, PA-C  Location: Office  Delivery: Face-to-face     HPI  Dana Bradley, a 50 y.o. year old female, is here today because of her Abnormal MRI, lumbar spine [R93.7]. Dana Bradley primary complain today is Pain (Left groin that affects upper left thigh) and Leg Pain (Right is posterior and lateral to toes. Last two toes are numb on the right. Worsens as day goes on.) Last encounter: My last encounter with her was on 11/09/2020. Pertinent problems: Dana Bradley has Cervico-occipital neuralgia; DDD (degenerative disc disease), lumbosacral; Chronic pain of multiple joints; DDD (degenerative disc disease), lumbar; Fibromyalgia; Migraine without aura and with status migrainosus, not intractable; Chronic lower extremity cramps (Bilateral) (R>L); Chronic pain syndrome; Neurogenic pain; Chronic low back pain (1ry area of Pain) (Bilateral) (R>L) (midline); Chronic upper back pain (2ry area of Pain) (Bilateral) (L>R); Chronic abdominal pain (Right lower quadrant); Thoracic radiculitis (Bilateral: T10, T11); Chronic occipital neuralgia (3ry area of Pain) (Bilateral) (L>R); Chronic neck pain; Chronic cervical radicular pain (Bilateral) (L>R); Chronic shoulder blade pain (Bilateral) (L>R); Chronic upper  extremity pain (Bilateral) (R>L); Chronic knee pain (Bilateral) (R>L); Chronic ankle pain (Bilateral); Cervical spondylosis with myelopathy and radiculopathy; Nephrolithiasis; Plantar fasciitis of left foot; Cervicogenic headache; Bilateral leg edema; Chronic hip pain (Left); Chronic sacroiliac joint pain (Left); Lumbar facet syndrome (Bilateral) (L>R); Lumbar radiculitis (Left); Lumbar spondylosis; Migraine headache; Muscle spasticity; Osteoarthritis of shoulder (Bilateral); Lumbar L1-2 disc protrusion (Right); Myofascial pain syndrome (Left) (trapezius muscle); Trigger point of shoulder region (Left); Chronic fatigue syndrome with fibromyalgia; Occipital headache; Trigger point of neck (Left); Malar rash; Chronic migraine without aura, with intractable migraine, so stated, with status migrainosus; Weakness of leg (Left); Difficulty walking; Migraine with aura and with status migrainosus, not intractable; Cervico-occipital neuralgia (Left); DDD (degenerative disc disease), cervical; Cervical facet syndrome (Bilateral) (L>R); DDD (degenerative disc disease), thoracic; Osteoarthritis of hip (Left); Chronic groin pain (Bilateral) (L>R); Chronic hip pain (Bilateral) (L>R); Somatic dysfunction of sacroiliac joint (Bilateral); Other specified dorsopathies, sacral and sacrococcygeal region; Intractable migraine with aura without status migrainosus; Chronic lower extremity pain (Bilateral); Lumbar radiculitis (Right); Neurogenic urinary incontinence; Other intervertebral disc degeneration, lumbar region; Spinal stenosis of lumbosacral region; Hyperalgesia; Abnormal MRI, lumbar spine (03/16/2020); Chronic midline thoracic back pain; Abnormal MRI, thoracic spine (08/02/2019); Thoracic spinal stenosis (T7-8); 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 Pain Assessment: Severity of Chronic pain is reported as a 7 /10. Location: Leg Right,Left/left  groin. Onset: More than a month ago. Quality: Aching,Sharp,Constant. Timing: Constant. Modifying factor(s): medications, rest, topicals, heat. Vitals:  height is $RemoveB'5\' 3"'FXyzowpg$  (1.6 m) and weight is 220 lb (99.8 kg). Her temporal temperature is 97.9 F (36.6 C). Her blood pressure is 123/87 and her pulse is 93. Her respiration is 18 and oxygen saturation is 98%.   Reason for encounter: post-procedure assessment.  The  patient did not follow up with her postprocedure evaluation 11/23/2020.  Today she returns and describes having attained 75% relief of her pain that lasted for over 2-1/2 months.  However, she is now having recurrence of the pain in the left cervical region shoulder, neck, and occipital area.  She is interested in having a treatment for that pain.  I will go ahead and schedule her for a left-sided cervical epidural steroid injection #3.  The last time we treated this area was on 10/30/2016.  In addition to that, the patient is also complaining of pain in the right lower back and down the right lower extremity and what appears to be a right lumbosacral radiculitis.  Looking at her MRI, she has a protrusion at the L4-5 level and therefore we have talked about doing an epidural steroid injection on the right side at that level.  We will plan on doing this 2 weeks after her cervical treatment.  Last time she had her hydrocodone failed was on 03/01/2020, 341 days ago.  The patient indicates still having 14.5 pills left.  This comes to be an average of 10 pills/month.   Post-Procedure Evaluation  Procedure (11/09/2020): Therapeutic bilateral L2 TFESI #2 + right T8-9 thoracic ESI #2 under fluoroscopic guidance and IV sedation Pre-procedure pain level: 6/10 Post-procedure: 2/10 (> 50% relief)  Sedation: Sedation provided.  Effectiveness during initial hour after procedure(Ultra-Short Term Relief):  (can't remember).  Local anesthetic used: Long-acting (4-6 hours) Effectiveness: Defined as any analgesic  benefit obtained secondary to the administration of local anesthetics. This carries significant diagnostic value as to the etiological location, or anatomical origin, of the pain. Duration of benefit is expected to coincide with the duration of the local anesthetic used.  Effectiveness during initial 4-6 hours after procedure(Short-Term Relief):  (can't remember).  Long-term benefit: Defined as any relief past the pharmacologic duration of the local anesthetics.  Effectiveness past the initial 6 hours after procedure(Long-Term Relief): 75 % (2.5 months).  Current benefits: Defined as benefit that persist at this time.   Analgesia:  The patient indicated having attained at least a 75% relief of the pain that lasted for over 2-1/2 months.  She is now having some recurrence of the pain but in the left neck, upper extremity, and occipital area. Function: Dana Bradley reports improvement in function ROM: Dana Bradley reports improvement in ROM  Pharmacotherapy Assessment   Analgesic: Hydrocodone/APAP 7.5/325, 1 tab PO QD. she appears to be using less than 10 pills/month. MME/day:3.75mg /day.   Monitoring: [place] PMP: PDMP reviewed during this encounter.       Pharmacotherapy: No side-effects or adverse reactions reported. Compliance: No problems identified. Effectiveness: Clinically acceptable.  Hart Rochester, RN  02/05/2021 11:11 AM  Sign when Signing Visit Nursing Pain Medication Assessment:  Safety precautions to be maintained throughout the outpatient stay will include: orient to surroundings, keep bed in low position, maintain call bell within reach at all times, provide assistance with transfer out of bed and ambulation.  Medication Inspection Compliance: Pill count conducted under aseptic conditions, in front of the patient. Neither the pills nor the bottle was removed from the patient's sight at any time. Once count was completed pills were immediately returned to the patient in their original  bottle.  Medication: Hydrocodone/APAP Pill/Patch Count: 14.5 of 90 pills remain Pill/Patch Appearance: Markings consistent with prescribed medication Bottle Appearance: Standard pharmacy container. Clearly labeled. Filled Date: 06 / 21 / 2021 Last Medication intake:  Today    UDS:  Summary  Date Value Ref Range Status  03/05/2018 FINAL  Final    Comment:    ==================================================================== TOXASSURE SELECT 13 (MW) ==================================================================== Test                             Result       Flag       Units Drug Present   Hydrocodone                    422                     ng/mg creat   Hydromorphone                  105                     ng/mg creat   Norhydrocodone                 312                     ng/mg creat    Sources of hydrocodone include scheduled prescription    medications. Hydromorphone and norhydrocodone are expected    metabolites of hydrocodone. Hydromorphone is also available as a    scheduled prescription medication. ==================================================================== Test                      Result    Flag   Units      Ref Range   Creatinine              148              mg/dL      >=20 ==================================================================== Declared Medications:  Medication list was not provided. ==================================================================== For clinical consultation, please call [phone]. ====================================================================      ROS  Constitutional: Denies any fever or chills Gastrointestinal: No reported hemesis, hematochezia, vomiting, or acute GI distress Musculoskeletal: Denies any acute onset joint swelling, redness, loss of ROM, or weakness Neurological: No reported episodes of acute onset apraxia, aphasia, dysarthria, agnosia, amnesia, paralysis, loss of coordination, or loss  of consciousness  Medication Review  Capsaicin-Menthol-Methyl Sal, HYDROcodone-acetaminophen, Melatonin, Rimegepant Sulfate, Valerian, Vitamin B-12, Vitamin D (Ergocalciferol), amitriptyline, atorvastatin, baclofen, butalbital-acetaminophen-caffeine, diclofenac Sodium, dicyclomine, dihydroergotamine, lipase/protease/amylase, magnesium oxide, medroxyPROGESTERone, metoprolol tartrate, mupirocin ointment, omeprazole, ondansetron, pregabalin, pyridOXINE, and sodium chloride 0.9 % SOLN 100 mL with Eptinezumab-jjmr 100 MG/ML SOLN 100 mg  History Review  Allergy: Dana Bradley is allergic to aspirin, cymbalta [duloxetine hcl], depakote [divalproex sodium], gadolinium derivatives, haloperidol, meperidine, reglan [metoclopramide], tramadol hcl, trazodone, compazine [prochlorperazine], meloxicam, penicillins, tomato, other, shellfish allergy, shellfish-derived products, bacitracin-neomycin-polymyxin, cephalosporins, ibuprofen, latex, neosporin [neomycin-bacitracin zn-polymyx], nsaids, sulfa antibiotics, and sulfonamide derivatives. Drug: Dana Bradley  reports no history of drug use. Alcohol:  reports no history of alcohol use. Tobacco:  reports that she quit smoking about 28 years ago. Her smoking use included cigarettes. She has a 12.00 pack-year smoking history. She has never used smokeless tobacco. Social: Dana Bradley  reports that she quit smoking about 28 years ago. Her smoking use included cigarettes. She has a 12.00 pack-year smoking history. She has never used smokeless tobacco. She reports that she does not drink alcohol and does not use drugs. Medical:  has a past medical history of Acute postoperative pain (04/07/2017), Anxiety, Bursitis, Chronic fatigue (12/12/2017), Chronic fatigue syndrome, Colitis (2021),  Edema leg (05/02/2015), Fibromyalgia, GERD (gastroesophageal reflux disease), IBS (irritable bowel syndrome), Knee pain, bilateral (12/21/2008), Lumbar discitis, Migraines, Osteoarthritis, Right hand pain  (04/10/2015), Sleep apnea, Spinal stenosis, SVT (supraventricular tachycardia) ([place]), Vertigo, and Vitamin D deficiency (05/01/2016). Surgical: Dana Bradley  has a past surgical history that includes Ablation; Tubal ligation (10/01/99); Cardiac catheterization; Knee arthroscopy; Colonoscopy with propofol (N/A, 05/17/2015); Esophagogastroduodenoscopy (N/A, 05/17/2015); spg; Tibial Tubercle Bypass (Right, 1998); and Colonoscopy with propofol (N/A, 03/20/2020). Family: family history includes Alcohol abuse in her father; Alzheimer's disease in her paternal grandmother and another family member; Aneurysm in her maternal grandfather; Anxiety disorder in her father, mother, sister, and sister; Arthritis in her maternal grandmother; Bipolar disorder in her sister; COPD in her paternal grandfather; Cancer in her mother; Cancer (age of onset: 43) in her maternal grandmother; Depression in her father, mother, sister, and sister; Diabetes in her sister; Heart attack in her paternal grandfather; Heart disease in her father, maternal grandfather, and paternal grandfather; Hyperlipidemia in her maternal grandmother, mother, and sister; Hypertension in her father, maternal grandfather, mother, paternal grandfather, sister, and sister; Migraines in her daughter, daughter, mother, sister, sister, and son; Polycystic ovary syndrome in her sister; Stroke in her father; Thyroid disease in her maternal grandmother.  Laboratory Chemistry Profile   Renal Lab Results  Component Value Date   BUN 9 02/22/2020   CREATININE 0.65 91/63/8466   BCR NOT APPLICABLE 59/93/5701   GFRAA >60 02/22/2020   GFRNONAA >60 02/22/2020     Hepatic Lab Results  Component Value Date   AST 28 02/22/2020   ALT 37 02/22/2020   ALBUMIN 4.1 02/22/2020   ALKPHOS 83 02/22/2020   HCVAB NON REACTIVE 09/21/2019     Electrolytes Lab Results  Component Value Date   NA 140 02/22/2020   K 3.8 02/22/2020   CL 105 02/22/2020   CALCIUM 9.3 02/22/2020   MG  2.1 04/01/2017     Bone Lab Results  Component Value Date   VD25OH 16.49 (L) 02/22/2020   25OHVITD1 20 (L) 04/01/2017   25OHVITD2 5.4 04/01/2017   25OHVITD3 15 04/01/2017     Inflammation (CRP: Acute Phase) (ESR: Chronic Phase) Lab Results  Component Value Date   CRP 6.0 07/30/2018   ESRSEDRATE 17 07/30/2018       Note: Above Lab results reviewed.  Recent Imaging Review  DG PAIN CLIN[facility] C-ARM 1-60 MIN NO REPORT Fluoro was used, but no Radiologist interpretation will be provided.  Please refer to "NOTES" tab for provider progress note. Note: Reviewed        Physical Exam  General appearance: Well nourished, well developed, and well hydrated. In no apparent acute distress Mental status: Alert, oriented x 3 (person, place, & time)       Respiratory: No evidence of acute respiratory distress Eyes: PERLA Vitals: BP 123/87   Pulse 93   Temp 97.9 F (36.6 C) (Temporal)   Resp 18   Ht $R'5\' 3"'Cr$  (1.6 m)   Wt 220 lb (99.8 kg)   LMP 10/08/2020 (Approximate)   SpO2 98%   BMI 38.97 kg/m  BMI: Estimated body mass index is 38.97 kg/m as calculated from the following:   Height as of this encounter: $RemoveBeforeD'5\' 3"'qPMdewKVqhVXGH$  (1.6 m).   Weight as of this encounter: 220 lb (99.8 kg). Ideal: Ideal body weight: 52.4 kg (115 lb 8.3 oz) Adjusted ideal body weight: 71.4 kg (157 lb 5 oz)  Assessment   Status Diagnosis  Controlled Controlled Controlled 1. Abnormal MRI, lumbar spine (03/16/2020)  2. Chronic pain syndrome   3. Chronic lower extremity cramps (Bilateral) (R>L)   4. Chronic low back pain (1ry area of Pain) (Bilateral) (R>L) (midline)   5. DDD (degenerative disc disease), lumbosacral   6. Lumbar radiculitis (Right)   7. DDD (degenerative disc disease), cervical   8. Cervicogenic headache   9. Cervico-occipital neuralgia (Left)   10. Cervical spondylosis with myelopathy and radiculopathy      Updated Problems: No problems updated.  Plan of Care  Problem-specific:  No problem-specific  Assessment & Plan notes found for this encounter.  Dana Bradley has a current medication list which includes the following long-term medication(s): dihydroergotamine, pregabalin, amitriptyline, atorvastatin, baclofen, diclofenac sodium, dicyclomine, hydrocodone-acetaminophen, medroxyprogesterone, medroxyprogesterone, metoprolol tartrate, and omeprazole.  Pharmacotherapy (Medications Ordered): Meds ordered this encounter  Medications  . HYDROcodone-acetaminophen (NORCO) 7.5-325 MG tablet    Sig: Take 1 tablet by mouth every 6 (six) hours as needed for severe pain. Must last 90 days    Dispense:  120 tablet    Refill:  0    Chronic Pain: STOP Act (Not applicable) Fill 1 day early if closed on refill date. Do not fill until: 04/30/2020. To last until: 05/30/2020. Avoid benzodiazepines within 8 hours of opioids   Orders:  Orders Placed This Encounter  Procedures  . Lumbar Epidural Injection    Standing Status:   Future    Standing Expiration Date:   05/08/2021    Scheduling Instructions:     Procedure: Interlaminar Lumbar Epidural Steroid injection (LESI)  L4-5     Laterality: Right-sided     Sedation: Patient's choice.     Timeframe: 2 weeks after Cervical ESI    Order Specific Question:   Where will this procedure be performed?    Answer:   [facility]  . Cervical Epidural Injection    Level(s): C7-T1 Laterality: Left-sided Purpose: Diagnostic/Therapeutic Indication(s): Radiculitis and cervicalgia associater with cervical degenerative disc disease.    Standing Status:   Future    Standing Expiration Date:   03/08/2021    Scheduling Instructions:     Procedure: Cervical Epidural Steroid Injection/Block     Sedation: With Sedation.     Timeframe: As soon as schedule allows    Order Specific Question:   Where will this procedure be performed?    Answer:   [facility]    Comments:   by Dr. Dossie Arbour   Follow-up plan:   Return for Procedure (w/ sedation): (L) Cervical  ESI #3.      Interventional Therapies  Risk  Complexity Considerations:   Estimated body mass index is 38.97 kg/m as calculated from the following:   Height as of this encounter: $RemoveBeforeD'5\' 3"'SEskywpxBLxiKz$  (1.6 m).   Weight as of this encounter: 220 lb (99.8 kg). WNL   Planned  Pending:   Pending further evaluation   Under consideration:   Therapeutic left cervical ESI #3  Therapeutic right L4-5 LESI #2    Completed:   Neurosurgical referral for decompressive laminectomy and/or discectomy Therapeutic left C7-T1 cervical ESI x2 (10/30/2016)  Therapeutic bilateral lumbar facet block x1 (01/06/2017)  Therapeutic left SI joint block x2 (08/03/2019)  Therapeutic right L1-2 LESI x1 (12/16/2017)  Therapeutic left L1-2 LESI x1 (04/07/2017)  Therapeutic bilateral L2 TFESI x2 (11/09/2020)  Therapeutic left L4-5 LESI x1 (09/07/2019)  Therapeutic left trapezius muscle TPI/MNB x3 (03/19/2018)  Therapeutic right trapezius muscle trigger point injection x1 (11/18/2017)  Therapeutic left occipital nerve RFA x1 (12/25/2017)  Therapeutic left C2 +  TON RFA x1 (12/25/2017)  Therapeutic left caudal ESI x1 (09/28/2019)  Therapeutic right T8-9 thoracic ESI x1 (11/09/2020)    Therapeutic  Palliative (PRN) options:   Diagnostic left SI joint block #2  Diagnostic/therapeutic left IA hip joint injection #2  Diagnostic bilateral lumbar facet block #2  Palliative left trapezius muscle MNB #4 Palliative right trapezius muscle MNB #2 Palliative left L1-2 LESI #2 Palliative right L1-2 LESI #2 Palliativeleft CESI #3 Palliative left greater occipital NB #3  Palliative left C2 + TON NB #2  Palliative left GON + C2 + TON RFA #2 (last done 12/25/2017) Diagnostic/therapeutic bilateral L2 TFESI #2 (done on 03/07/2020) (100/100/100/>50)    Recent Visits Date Type Provider Dept  11/09/20 Procedure visit Milinda Pointer, MD [facility]  11/08/20 Office Visit Milinda Pointer, MD [facility]  Showing recent  visits within past 90 days and meeting all other requirements Today's Visits Date Type Provider Dept  02/05/21 Office Visit Milinda Pointer, MD [facility]  Showing today's visits and meeting all other requirements Future Appointments No visits were found meeting these conditions. Showing future appointments within next 90 days and meeting all other requirements  I discussed the assessment and treatment plan with the patient. The patient was provided an opportunity to ask questions and all were answered. The patient agreed with the plan and demonstrated an understanding of the instructions.  Patient advised to call back or seek an in-person evaluation if the symptoms or condition worsens.  Duration of encounter: 30 minutes.  Note by: Gaspar Cola, MD Date: 02/05/2021; Time: 6:06 PM

## 2021-02-05 ENCOUNTER — Encounter: Payer: Self-pay | Admitting: Pain Medicine

## 2021-02-05 ENCOUNTER — Ambulatory Visit: Payer: Self-pay | Attending: Pain Medicine | Admitting: Pain Medicine

## 2021-02-05 ENCOUNTER — Other Ambulatory Visit: Payer: Self-pay

## 2021-02-05 VITALS — BP 123/87 | HR 93 | Temp 97.9°F | Resp 18 | Ht 63.0 in | Wt 220.0 lb

## 2021-02-05 DIAGNOSIS — M5441 Lumbago with sciatica, right side: Secondary | ICD-10-CM | POA: Insufficient documentation

## 2021-02-05 DIAGNOSIS — M5137 Other intervertebral disc degeneration, lumbosacral region: Secondary | ICD-10-CM | POA: Insufficient documentation

## 2021-02-05 DIAGNOSIS — M5481 Occipital neuralgia: Secondary | ICD-10-CM | POA: Insufficient documentation

## 2021-02-05 DIAGNOSIS — M4722 Other spondylosis with radiculopathy, cervical region: Secondary | ICD-10-CM | POA: Insufficient documentation

## 2021-02-05 DIAGNOSIS — M5442 Lumbago with sciatica, left side: Secondary | ICD-10-CM | POA: Insufficient documentation

## 2021-02-05 DIAGNOSIS — G8929 Other chronic pain: Secondary | ICD-10-CM | POA: Insufficient documentation

## 2021-02-05 DIAGNOSIS — M5416 Radiculopathy, lumbar region: Secondary | ICD-10-CM | POA: Insufficient documentation

## 2021-02-05 DIAGNOSIS — G4486 Cervicogenic headache: Secondary | ICD-10-CM | POA: Insufficient documentation

## 2021-02-05 DIAGNOSIS — R252 Cramp and spasm: Secondary | ICD-10-CM | POA: Insufficient documentation

## 2021-02-05 DIAGNOSIS — M4712 Other spondylosis with myelopathy, cervical region: Secondary | ICD-10-CM | POA: Insufficient documentation

## 2021-02-05 DIAGNOSIS — G894 Chronic pain syndrome: Secondary | ICD-10-CM | POA: Insufficient documentation

## 2021-02-05 DIAGNOSIS — R937 Abnormal findings on diagnostic imaging of other parts of musculoskeletal system: Secondary | ICD-10-CM | POA: Insufficient documentation

## 2021-02-05 DIAGNOSIS — M5030 Other cervical disc degeneration, unspecified cervical region: Secondary | ICD-10-CM | POA: Insufficient documentation

## 2021-02-05 MED ORDER — HYDROCODONE-ACETAMINOPHEN 7.5-325 MG PO TABS: 1.0000 | ORAL_TABLET | Freq: Four times a day (QID) | ORAL | 0 refills | Status: DC | PRN

## 2021-02-05 NOTE — Patient Instructions (Signed)
____________________________________________________________________________________________  Preparing for Procedure with Sedation  Procedure appointments are limited to planned procedures: . No Prescription Refills. . No disability issues will be discussed. . No medication changes will be discussed.  Instructions: . Oral Intake: Do not eat or drink anything for at least 8 hours prior to your procedure. (Exception: Blood Pressure Medication. See below.) . Transportation: Unless otherwise stated by your physician, you may drive yourself after the procedure. . Blood Pressure Medicine: Do not forget to take your blood pressure medicine with a sip of water the morning of the procedure. If your Diastolic (lower reading)is above 100 mmHg, elective cases will be cancelled/rescheduled. . Blood thinners: These will need to be stopped for procedures. Notify our staff if you are taking any blood thinners. Depending on which one you take, there will be specific instructions on how and when to stop it. . Diabetics on insulin: Notify the staff so that you can be scheduled 1st case in the morning. If your diabetes requires high dose insulin, take only  of your normal insulin dose the morning of the procedure and notify the staff that you have done so. . Preventing infections: Shower with an antibacterial soap the morning of your procedure. . Build-up your immune system: Take 1000 mg of Vitamin C with every meal (3 times a day) the day prior to your procedure. . Antibiotics: Inform the staff if you have a condition or reason that requires you to take antibiotics before dental procedures. . Pregnancy: If you are pregnant, call and cancel the procedure. . Sickness: If you have a cold, fever, or any active infections, call and cancel the procedure. . Arrival: You must be in the facility at least 30 minutes prior to your scheduled procedure. . Children: Do not bring children with you. . Dress appropriately:  Bring dark clothing that you would not mind if they get stained. . Valuables: Do not bring any jewelry or valuables.  Reasons to call and reschedule or cancel your procedure: (Following these recommendations will minimize the risk of a serious complication.) . Surgeries: Avoid having procedures within 2 weeks of any surgery. (Avoid for 2 weeks before or after any surgery). . Flu Shots: Avoid having procedures within 2 weeks of a flu shots or . (Avoid for 2 weeks before or after immunizations). . Barium: Avoid having a procedure within 7-10 days after having had a radiological study involving the use of radiological contrast. (Myelograms, Barium swallow or enema study). . Heart attacks: Avoid any elective procedures or surgeries for the initial 6 months after a "Myocardial Infarction" (Heart Attack). . Blood thinners: It is imperative that you stop these medications before procedures. Let us know if you if you take any blood thinner.  . Infection: Avoid procedures during or within two weeks of an infection (including chest colds or gastrointestinal problems). Symptoms associated with infections include: Localized redness, fever, chills, night sweats or profuse sweating, burning sensation when voiding, cough, congestion, stuffiness, runny nose, sore throat, diarrhea, nausea, vomiting, cold or Flu symptoms, recent or current infections. It is specially important if the infection is over the area that we intend to treat. . Heart and lung problems: Symptoms that may suggest an active cardiopulmonary problem include: cough, chest pain, breathing difficulties or shortness of breath, dizziness, ankle swelling, uncontrolled high or unusually low blood pressure, and/or palpitations. If you are experiencing any of these symptoms, cancel your procedure and contact your primary care physician for an evaluation.  Remember:  Regular Business hours are:    Monday to Thursday 8:00 AM to 4:00 PM  Provider's  Schedule: Keimani Laufer, MD:  Procedure days: Tuesday and Thursday 7:30 AM to 4:00 PM  Bilal Lateef, MD:  Procedure days: Monday and Wednesday 7:30 AM to 4:00 PM ____________________________________________________________________________________________   ____________________________________________________________________________________________  General Risks and Possible Complications  Patient Responsibilities: It is important that you read this as it is part of your informed consent. It is our duty to inform you of the risks and possible complications associated with treatments offered to you. It is your responsibility as a patient to read this and to ask questions about anything that is not clear or that you believe was not covered in this document.  Patient's Rights: You have the right to refuse treatment. You also have the right to change your mind, even after initially having agreed to have the treatment done. However, under this last option, if you wait until the last second to change your mind, you may be charged for the materials used up to that point.  Introduction: Medicine is not an exact science. Everything in Medicine, including the lack of treatment(s), carries the potential for danger, harm, or loss (which is by definition: Risk). In Medicine, a complication is a secondary problem, condition, or disease that can aggravate an already existing one. All treatments carry the risk of possible complications. The fact that a side effects or complications occurs, does not imply that the treatment was conducted incorrectly. It must be clearly understood that these can happen even when everything is done following the highest safety standards.  No treatment: You can choose not to proceed with the proposed treatment alternative. The "PRO(s)" would include: avoiding the risk of complications associated with the therapy. The "CON(s)" would include: not getting any of the treatment  benefits. These benefits fall under one of three categories: diagnostic; therapeutic; and/or palliative. Diagnostic benefits include: getting information which can ultimately lead to improvement of the disease or symptom(s). Therapeutic benefits are those associated with the successful treatment of the disease. Finally, palliative benefits are those related to the decrease of the primary symptoms, without necessarily curing the condition (example: decreasing the pain from a flare-up of a chronic condition, such as incurable terminal cancer).  General Risks and Complications: These are associated to most interventional treatments. They can occur alone, or in combination. They fall under one of the following six (6) categories: no benefit or worsening of symptoms; bleeding; infection; nerve damage; allergic reactions; and/or death. 1. No benefits or worsening of symptoms: In Medicine there are no guarantees, only probabilities. No healthcare provider can ever guarantee that a medical treatment will work, they can only state the probability that it may. Furthermore, there is always the possibility that the condition may worsen, either directly, or indirectly, as a consequence of the treatment. 2. Bleeding: This is more common if the patient is taking a blood thinner, either prescription or over the counter (example: Goody Powders, Fish oil, Aspirin, Garlic, etc.), or if suffering a condition associated with impaired coagulation (example: Hemophilia, cirrhosis of the liver, low platelet counts, etc.). However, even if you do not have one on these, it can still happen. If you have any of these conditions, or take one of these drugs, make sure to notify your treating physician. 3. Infection: This is more common in patients with a compromised immune system, either due to disease (example: diabetes, cancer, human immunodeficiency virus [HIV], etc.), or due to medications or treatments (example: therapies used to treat  cancer and   rheumatological diseases). However, even if you do not have one on these, it can still happen. If you have any of these conditions, or take one of these drugs, make sure to notify your treating physician. 4. Nerve Damage: This is more common when the treatment is an invasive one, but it can also happen with the use of medications, such as those used in the treatment of cancer. The damage can occur to small secondary nerves, or to large primary ones, such as those in the spinal cord and brain. This damage may be temporary or permanent and it may lead to impairments that can range from temporary numbness to permanent paralysis and/or brain death. 5. Allergic Reactions: Any time a substance or material comes in contact with our body, there is the possibility of an allergic reaction. These can range from a mild skin rash (contact dermatitis) to a severe systemic reaction (anaphylactic reaction), which can result in death. 6. Death: In general, any medical intervention can result in death, most of the time due to an unforeseen complication. ____________________________________________________________________________________________   

## 2021-02-05 NOTE — Progress Notes (Signed)
Nursing Pain Medication Assessment:  Safety precautions to be maintained throughout the outpatient stay will include: orient to surroundings, keep bed in low position, maintain call bell within reach at all times, provide assistance with transfer out of bed and ambulation.  Medication Inspection Compliance: Pill count conducted under aseptic conditions, in front of the patient. Neither the pills nor the bottle was removed from the patient's sight at any time. Once count was completed pills were immediately returned to the patient in their original bottle.  Medication: Hydrocodone/APAP Pill/Patch Count: 14.5 of 90 pills remain Pill/Patch Appearance: Markings consistent with prescribed medication Bottle Appearance: Standard pharmacy container. Clearly labeled. Filled Date: 06 / 21 / 2021 Last Medication intake:  Today

## 2021-02-07 ENCOUNTER — Encounter: Payer: Self-pay | Admitting: Obstetrics and Gynecology

## 2021-02-07 ENCOUNTER — Ambulatory Visit (INDEPENDENT_AMBULATORY_CARE_PROVIDER_SITE_OTHER): Payer: Self-pay | Admitting: Obstetrics and Gynecology

## 2021-02-07 ENCOUNTER — Other Ambulatory Visit: Payer: Self-pay

## 2021-02-07 VITALS — BP 109/69 | HR 87 | Ht 63.0 in | Wt 220.6 lb

## 2021-02-07 DIAGNOSIS — N809 Endometriosis, unspecified: Secondary | ICD-10-CM

## 2021-02-07 DIAGNOSIS — G8929 Other chronic pain: Secondary | ICD-10-CM

## 2021-02-07 DIAGNOSIS — R102 Pelvic and perineal pain: Secondary | ICD-10-CM

## 2021-02-07 DIAGNOSIS — Z9889 Other specified postprocedural states: Secondary | ICD-10-CM

## 2021-02-07 NOTE — Progress Notes (Signed)
HPI:      Dana Bradley is a 50 y.o. [phone] who LMP was No LMP recorded. (Menstrual status: Oral contraceptives).  Subjective:   She presents today with complaint of pelvic pain.  She has different kinds of pelvic pain.  Some of her pelvic pain is associated with GI problems of diarrhea.  At other times her pelvic pain seems to be cyclic.  Of significant note patient has a long list of medical issues that could cause this constellation of symptoms. (Please see medical history) In addition, patient has seen another local OB/GYN for this same issue and he placed her on daily progesterone.  She reports that this seems to be working at improving her cyclic pain. Patient has a history of tubal sterilization and endometrial ablation. (She is not having regular menstrual periods but occasionally has brown/black discharge that she believes is her menses.)    Hx: The following portions of the patient's history were reviewed and updated as appropriate:             She  has a past medical history of Acute postoperative pain (04/07/2017), Anxiety, Bursitis, Chronic fatigue (12/12/2017), Chronic fatigue syndrome, Colitis (2021), Edema leg (05/02/2015), Fibromyalgia, GERD (gastroesophageal reflux disease), IBS (irritable bowel syndrome), Knee pain, bilateral (12/21/2008), Lumbar discitis, Migraines, Osteoarthritis, Right hand pain (04/10/2015), Sleep apnea, Spinal stenosis, SVT (supraventricular tachycardia) ([place]), Vertigo, and Vitamin D deficiency (05/01/2016). She does not have any pertinent problems on file. She  has a past surgical history that includes Ablation; Tubal ligation (10/01/99); Cardiac catheterization; Knee arthroscopy; Colonoscopy with propofol (N/A, 05/17/2015); Esophagogastroduodenoscopy (N/A, 05/17/2015); spg; Tibial Tubercle Bypass (Right, 1998); and Colonoscopy with propofol (N/A, 03/20/2020). Her family history includes Alcohol abuse in her father; Alzheimer's disease in her paternal grandmother and  another family member; Aneurysm in her maternal grandfather; Anxiety disorder in her father, mother, sister, and sister; Arthritis in her maternal grandmother; Bipolar disorder in her sister; COPD in her paternal grandfather; Cancer in her mother; Cancer (age of onset: 24) in her maternal grandmother; Depression in her father, mother, sister, and sister; Diabetes in her sister; Heart attack in her paternal grandfather; Heart disease in her father, maternal grandfather, and paternal grandfather; Hyperlipidemia in her maternal grandmother, mother, and sister; Hypertension in her father, maternal grandfather, mother, paternal grandfather, sister, and sister; Migraines in her daughter, daughter, mother, sister, sister, and son; Polycystic ovary syndrome in her sister; Stroke in her father; Thyroid disease in her maternal grandmother. She  reports that she quit smoking about 28 years ago. Her smoking use included cigarettes. She has a 12.00 pack-year smoking history. She has never used smokeless tobacco. She reports that she does not drink alcohol and does not use drugs. She has a current medication list which includes the following prescription(s): amitriptyline, atorvastatin, baclofen, butalbital-acetaminophen-caffeine, capsaicin-methyl sal-menthol, vitamin b-12, dicyclomine, dihydroergotamine, hydrocodone-acetaminophen, creon, magnesium oxide, medroxyprogesterone, metoprolol tartrate, mupirocin ointment, omeprazole, ondansetron, nurtec, valerian, vitamin d (ergocalciferol), diclofenac sodium, medroxyprogesterone, melatonin, pregabalin, pyridoxine, and sodium chloride 0.9 % SOLN 100 mL with Eptinezumab-jjmr 100 MG/ML SOLN 100 mg. She is allergic to aspirin, cymbalta [duloxetine hcl], depakote [divalproex sodium], gadolinium derivatives, haloperidol, meperidine, reglan [metoclopramide], tramadol hcl, trazodone, compazine [prochlorperazine], meloxicam, penicillins, tomato, other, shellfish allergy, shellfish-derived  products, bacitracin-neomycin-polymyxin, cephalosporins, ibuprofen, latex, neosporin [neomycin-bacitracin zn-polymyx], nsaids, sulfa antibiotics, and sulfonamide derivatives.       Review of Systems:  Review of Systems  Constitutional: Denied constitutional symptoms, night sweats, recent illness, fatigue, fever, insomnia and weight loss.  Eyes: Denied eye symptoms,  eye pain, photophobia, vision change and visual disturbance.  Ears/Nose/Throat/Neck: Denied ear, nose, throat or neck symptoms, hearing loss, nasal discharge, sinus congestion and sore throat.  Cardiovascular: Denied cardiovascular symptoms, arrhythmia, chest pain/pressure, edema, exercise intolerance, orthopnea and palpitations.  Respiratory: Denied pulmonary symptoms, asthma, pleuritic pain, productive sputum, cough, dyspnea and wheezing.  Gastrointestinal: Denied, gastro-esophageal reflux, melena, nausea and vomiting.  Genitourinary: See HPI for additional information.  Musculoskeletal: Denied musculoskeletal symptoms, stiffness, swelling, muscle weakness and myalgia.  Dermatologic: Denied dermatology symptoms, rash and scar.  Neurologic: Denied neurology symptoms, dizziness, headache, neck pain and syncope.  Psychiatric: Denied psychiatric symptoms, anxiety and depression.  Endocrine: Denied endocrine symptoms including hot flashes and night sweats.   Meds:   Current Outpatient Medications on File Prior to Visit  Medication Sig Dispense Refill  . amitriptyline (ELAVIL) 10 MG tablet Take 1 tablet (10 mg total) by mouth at bedtime. 30 tablet 1  . atorvastatin (LIPITOR) 40 MG tablet TAKE 1 TABLET (40 MG TOTAL) BY MOUTH AT BEDTIME. 90 tablet 3  . baclofen (LIORESAL) 10 MG tablet TAKE ONE TABLET BY MOUTH 3 TIMES A DAY 90 tablet 2  . butalbital-acetaminophen-caffeine (FIORICET) 50-325-40 MG tablet Take 1 tablet by mouth every 8 (eight) hours as needed for headache. 20 tablet 0  . Capsaicin-Menthol-Methyl Sal (CAPSAICIN-METHYL  SAL-MENTHOL) 0.025-1-12 % CREA Apply 1 application topically 4 (four) times daily as needed. 56.6 g 0  . Cyanocobalamin (VITAMIN B-12) 500 MCG SUBL Place 1,500 mcg under the tongue daily.  150 tablet   . dicyclomine (BENTYL) 10 MG capsule Take 1 capsule (10 mg total) by mouth 4 (four) times daily as needed (before meals and at bedtime) 360 capsule 1  . dihydroergotamine (MIGRANAL) 4 MG/ML nasal spray Place 1 spray into the nose as needed for migraine. Use in one nostril as directed.  No more than 4 sprays in one hour 8 mL 12  . HYDROcodone-acetaminophen (NORCO) 7.5-325 MG tablet Take 1 tablet by mouth every 6 (six) hours as needed for severe pain. Must last 90 days 120 tablet 0  . lipase/protease/amylase (CREON) 12000-38000 units CPEP capsule Take 2 capsules by mouth with the first bite of each meal and 1 capsule before snacks. (Patient taking differently: Take 2 capsules by mouth with the first bite of each meal and 1 capsule before snacks.) 240 capsule 1  . magnesium oxide (MAG-OX) 400 MG tablet Take 300 mg by mouth daily.     . medroxyPROGESTERone (PROVERA) 2.5 MG tablet TAKE TWO TABLETS BY MOUTH EVERY DAY 60 tablet 11  . metoprolol tartrate (LOPRESSOR) 50 MG tablet TAKE ONE TABLET BY MOUTH 2 TIMES A DAY 180 tablet 3  . mupirocin ointment (BACTROBAN) 2 % Apply 1 application topically 2 (two) times daily as needed. 22 g 0  . omeprazole (PRILOSEC) 40 MG capsule Take 1 capsule (40 mg total) by mouth daily. 90 capsule 3  . ondansetron (ZOFRAN) 4 MG tablet Take 1 tablet (4 mg total) by mouth every 8 (eight) hours as needed. 20 tablet 2  . Rimegepant Sulfate (NURTEC) 75 MG TBDP Take 1 tablet by mouth once every other day. 30 tablet 3  . VALERIAN PO Take by mouth as needed. Makes Valerian tea about 3-4 times per week.    . Vitamin D, Ergocalciferol, (DRISDOL) 1.25 MG (50000 UNIT) CAPS capsule TAKE ONE CAPSULE BY MOUTH TWICE A WEEK FOR 12 WEEKS 24 capsule 2  . diclofenac Sodium (VOLTAREN) 1 % GEL Apply 2 g  topically 4 (four) times  daily as needed. 350 g PRN  . medroxyPROGESTERone (PROVERA) 5 MG tablet Take 1 tablet (5 mg total) by mouth daily. 30 tablet 11  . Melatonin 10 MG TABS Take 10 mg by mouth at bedtime. (Patient not taking: No sig reported)    . pregabalin (LYRICA) 150 MG capsule Take 1 capsule (150 mg total) by mouth every 8 (eight) hours. 90 capsule 2  . pyridOXINE (VITAMIN B-6) 50 MG tablet Take 1 tablet (50 mg total) by mouth daily. (Patient not taking: No sig reported) 30 tablet 1  . sodium chloride 0.9 % SOLN 100 mL with Eptinezumab-jjmr 100 MG/ML SOLN 100 mg Inject 100 mg into the vein every 3 (three) months. (Patient not taking: No sig reported) 1 each 3   No current facility-administered medications on file prior to visit.          Objective:     Vitals:   02/07/21 1001  BP: 109/69  Pulse: 87   Filed Weights   02/07/21 1001  Weight: 220 lb 9.6 oz (100.1 kg)                Assessment:    [id number] Patient Active Problem List   Diagnosis Date Noted  . Abnormal MRI, lumbar spine (03/16/2020) 11/09/2020  . Chronic midline thoracic back pain 11/09/2020  . Abnormal MRI, thoracic spine (08/02/2019) 11/09/2020  . Thoracic spinal stenosis (T7-8) 11/09/2020  . Prolapse of thoracic disc with radiculopathy (T7-8) 11/09/2020  . Spasm of muscle of lower back 11/09/2020  . Vertigo, benign paroxysmal, unspecified laterality 11/09/2020  . Uncomplicated opioid dependence ([place]) 11/08/2020  . Hyperalgesia 03/07/2020  . Neurogenic urinary incontinence 03/01/2020  . Other intervertebral disc degeneration, lumbar region 03/01/2020  . Spinal stenosis of lumbosacral region 03/01/2020  . Pharmacologic therapy 02/06/2020  . Lumbar radiculitis (Right) 09/21/2019  . Chronic lower extremity pain (Bilateral) 09/06/2019  . Intractable migraine with aura without status migrainosus 08/10/2019  . Other specified dorsopathies, sacral and sacrococcygeal region 08/03/2019  . Latex  precautions, history of latex allergy 08/03/2019  . History of allergy to radiographic contrast media 08/03/2019  . DDD (degenerative disc disease), cervical 07/21/2019  . Cervical facet syndrome (Bilateral) (L>R) 07/21/2019  . DDD (degenerative disc disease), thoracic 07/21/2019  . Osteoarthritis of hip (Left) 07/21/2019  . Chronic groin pain (Bilateral) (L>R) 07/21/2019  . Chronic hip pain (Bilateral) (L>R) 07/21/2019  . Somatic dysfunction of sacroiliac joint (Bilateral) 07/21/2019  . Migraine with aura and with status migrainosus, not intractable 04/06/2019  . Cervico-occipital neuralgia (Left) 04/06/2019  . Weakness of leg (Left) 04/05/2019  . Difficulty walking 04/05/2019  . Chronic migraine without aura, with intractable migraine, so stated, with status migrainosus 12/27/2018  . Malar rash 09/04/2018  . Trigger point of neck (Left) 03/19/2018  . Occipital headache 12/25/2017  . Chronic fatigue syndrome with fibromyalgia 12/12/2017  . Trigger point of shoulder region (Left) 11/17/2017  . Myofascial pain syndrome (Left) (trapezius muscle) 07/22/2017  . Lumbar L1-2 disc protrusion (Right) 04/07/2017  . Muscle spasticity 04/01/2017  . Osteoarthritis of shoulder (Bilateral) 04/01/2017  . Lumbar spondylosis 01/06/2017  . Chronic hip pain (Left) 12/24/2016  . Chronic sacroiliac joint pain (Left) 12/24/2016  . Lumbar facet syndrome (Bilateral) (L>R) 12/24/2016  . Lumbar radiculitis (Left) 12/24/2016  . Hypertriglyceridemia 11/27/2016  . History of vasovagal episode 10/30/2016  . Cervicogenic headache 09/09/2016  . Medication monitoring encounter 08/29/2016  . Controlled substance agreement signed 08/28/2016  . Plantar fasciitis of left foot 08/28/2016  . Vitamin  B12 deficiency 08/28/2016  . Hyperlipidemia 08/28/2016  . Nephrolithiasis 08/12/2016  . Chronic pain syndrome 08/07/2016  . Long term prescription opiate use 08/07/2016  . Opiate use 08/07/2016  . Long term prescription  benzodiazepine use 08/07/2016  . Neurogenic pain 08/07/2016  . Chronic low back pain (1ry area of Pain) (Bilateral) (R>L) (midline) 08/07/2016  . Chronic upper back pain (2ry area of Pain) (Bilateral) (L>R) 08/07/2016  . Chronic abdominal pain (Right lower quadrant) 08/07/2016  . Thoracic radiculitis (Bilateral: T10, T11) 08/07/2016  . Chronic occipital neuralgia (3ry area of Pain) (Bilateral) (L>R) 08/07/2016  . Chronic neck pain 08/07/2016  . Chronic cervical radicular pain (Bilateral) (L>R) 08/07/2016  . Chronic shoulder blade pain (Bilateral) (L>R) 08/07/2016  . Chronic upper extremity pain (Bilateral) (R>L) 08/07/2016  . Chronic knee pain (Bilateral) (R>L) 08/07/2016  . Chronic ankle pain (Bilateral) 08/07/2016  . Cervical spondylosis with myelopathy and radiculopathy 08/07/2016  . Panic disorder with agoraphobia 05/29/2016  . Depression, unspecified depression type 05/29/2016  . Atypical lymphocytosis 05/01/2016  . Vitamin D insufficiency 05/01/2016  . Chronic lower extremity cramps (Bilateral) (R>L) 04/29/2016  . Obesity 04/29/2016  . GAD (generalized anxiety disorder) 04/29/2016  . Fatigue 04/29/2016  . Insomnia 07/12/2015  . Migraine without aura and with status migrainosus, not intractable 07/12/2015  . Chronic superficial gastritis 06/02/2015  . Chronic pain of multiple joints 05/15/2015  . Bilateral leg edema 05/02/2015  . Paroxysmal supraventricular tachycardia ([place]) 04/17/2015  . Exertional shortness of breath 04/17/2015  . Bright red rectal bleeding 04/06/2015  . DDD (degenerative disc disease), lumbosacral 01/24/2014  . DDD (degenerative disc disease), lumbar 01/24/2014  . Cervico-occipital neuralgia 12/29/2013  . Fibromyalgia 12/29/2013  . Migraine headache 12/29/2013  . Menorrhagia 12/10/2012  . Depression, major, recurrent, in remission ([place]) 01/12/2009  . Chest pain 01/12/2009  . Hypertension, benign essential, goal below 140/90 06/23/2008  . History of PSVT  (paroxysmal supraventricular tachycardia) 06/17/2008  . Obstructive sleep apnea 06/17/2008  . GERD 06/13/2008     1. Chronic pelvic pain in female   2. Endometriosis   3. History of endometrial ablation     Patient believes her chronic pelvic pain that is usually associated with diarrhea is a GI issue.  She states that she is only going to GYN because of a recommendation from her GI.  Her cyclic pain may be related to her ablation with possible pockets of living endometrium.  It also may be sterilization ablation syndrome-a known cause of pelvic pain.   Plan:            1.  We have discussed multiple options but I believe that because the progesterone is currently working she should maintain this regimen.  2.  Patient may make it to menopause without any further intervention and I would expect resolution of her cyclic pelvic pain at that time.  3.  Would consider Freida Busman in the future as a possible short-term test to see if her pelvic pain resolves.  4.  Discussed possibility of surgical intervention with hysterectomy-patient not interested in surgery. Orders No orders of the defined types were placed in this encounter.   No orders of the defined types were placed in this encounter.     F/U  Return for Pt to contact us if symptoms worsen. I spent 33 minutes involved in the care of this patient preparing to see the patient by obtaining and reviewing her medical history (including labs, imaging tests and prior procedures), documenting clinical information in the electronic  health record (EHR), counseling and coordinating care plans, writing and sending prescriptions, ordering tests or procedures and directly communicating with the patient by discussing pertinent items from her history and physical exam as well as detailing my assessment and plan as noted above so that she has an informed understanding.  All of her questions were answered.  Finis Bud, M.D. 02/07/2021 10:50  AM

## 2021-02-08 ENCOUNTER — Ambulatory Visit: Payer: Self-pay

## 2021-02-13 ENCOUNTER — Other Ambulatory Visit: Payer: Self-pay

## 2021-02-13 ENCOUNTER — Encounter: Payer: Self-pay | Admitting: Pain Medicine

## 2021-02-13 ENCOUNTER — Ambulatory Visit (HOSPITAL_BASED_OUTPATIENT_CLINIC_OR_DEPARTMENT_OTHER): Payer: Self-pay | Admitting: Pain Medicine

## 2021-02-13 ENCOUNTER — Ambulatory Visit
Admission: RE | Admit: 2021-02-13 | Discharge: 2021-02-13 | Disposition: A | Discharge status: Home / Self Care | Payer: Medicaid Other | Source: Ambulatory Visit | Attending: Pain Medicine | Admitting: Pain Medicine

## 2021-02-13 VITALS — BP 114/61 | HR 89 | Temp 97.7°F | Resp 18 | Ht 63.0 in | Wt 220.0 lb

## 2021-02-13 DIAGNOSIS — Z91040 Latex allergy status: Secondary | ICD-10-CM

## 2021-02-13 DIAGNOSIS — M79601 Pain in right arm: Secondary | ICD-10-CM | POA: Insufficient documentation

## 2021-02-13 DIAGNOSIS — M4722 Other spondylosis with radiculopathy, cervical region: Secondary | ICD-10-CM | POA: Insufficient documentation

## 2021-02-13 DIAGNOSIS — M5030 Other cervical disc degeneration, unspecified cervical region: Secondary | ICD-10-CM | POA: Insufficient documentation

## 2021-02-13 DIAGNOSIS — M5412 Radiculopathy, cervical region: Secondary | ICD-10-CM | POA: Insufficient documentation

## 2021-02-13 DIAGNOSIS — M4712 Other spondylosis with myelopathy, cervical region: Secondary | ICD-10-CM

## 2021-02-13 DIAGNOSIS — G4486 Cervicogenic headache: Secondary | ICD-10-CM

## 2021-02-13 DIAGNOSIS — G8929 Other chronic pain: Secondary | ICD-10-CM

## 2021-02-13 DIAGNOSIS — M5481 Occipital neuralgia: Secondary | ICD-10-CM

## 2021-02-13 DIAGNOSIS — R55 Syncope and collapse: Secondary | ICD-10-CM | POA: Insufficient documentation

## 2021-02-13 DIAGNOSIS — M79602 Pain in left arm: Secondary | ICD-10-CM | POA: Insufficient documentation

## 2021-02-13 DIAGNOSIS — M542 Cervicalgia: Secondary | ICD-10-CM | POA: Insufficient documentation

## 2021-02-13 DIAGNOSIS — M549 Dorsalgia, unspecified: Secondary | ICD-10-CM | POA: Insufficient documentation

## 2021-02-13 DIAGNOSIS — Z91041 Radiographic dye allergy status: Secondary | ICD-10-CM | POA: Insufficient documentation

## 2021-02-13 MED ORDER — MIDAZOLAM HCL 5 MG/5ML IJ SOLN
1.0000 mg | INTRAMUSCULAR | Status: DC | PRN
  Administered 2021-02-13: 1 mg via INTRAVENOUS

## 2021-02-13 MED ORDER — LIDOCAINE HCL 2 % IJ SOLN
INTRAMUSCULAR | Status: AC
  Filled 2021-02-13: qty 20

## 2021-02-13 MED ORDER — GLYCOPYRROLATE 0.2 MG/ML IJ SOLN
INTRAMUSCULAR | Status: AC
  Filled 2021-02-13: qty 1

## 2021-02-13 MED ORDER — FENTANYL CITRATE (PF) 100 MCG/2ML IJ SOLN
25.0000 ug | INTRAMUSCULAR | Status: DC | PRN
  Administered 2021-02-13: 50 ug via INTRAVENOUS

## 2021-02-13 MED ORDER — DEXAMETHASONE SODIUM PHOSPHATE 10 MG/ML IJ SOLN
10.0000 mg | Freq: Once | INTRAMUSCULAR | Status: AC
  Administered 2021-02-13: 10 mg

## 2021-02-13 MED ORDER — DEXAMETHASONE SODIUM PHOSPHATE 10 MG/ML IJ SOLN
INTRAMUSCULAR | Status: AC
  Filled 2021-02-13: qty 1

## 2021-02-13 MED ORDER — SODIUM CHLORIDE 0.9% FLUSH
1.0000 mL | Freq: Once | INTRAVENOUS | Status: AC
  Administered 2021-02-13: 1 mL

## 2021-02-13 MED ORDER — ROPIVACAINE HCL 2 MG/ML IJ SOLN
1.0000 mL | Freq: Once | INTRAMUSCULAR | Status: AC
  Administered 2021-02-13: 1 mL via EPIDURAL

## 2021-02-13 MED ORDER — LACTATED RINGERS IV SOLN
1000.0000 mL | Freq: Once | INTRAVENOUS | Status: AC
Start: 2021-02-13 — End: 2021-02-13
  Administered 2021-02-13: 1000 mL via INTRAVENOUS

## 2021-02-13 MED ORDER — LIDOCAINE HCL 2 % IJ SOLN
20.0000 mL | Freq: Once | INTRAMUSCULAR | Status: AC
Start: 2021-02-13 — End: 2021-02-13
  Administered 2021-02-13: 400 mg

## 2021-02-13 MED ORDER — FENTANYL CITRATE (PF) 100 MCG/2ML IJ SOLN
INTRAMUSCULAR | Status: AC
  Filled 2021-02-13: qty 2

## 2021-02-13 MED ORDER — GLYCOPYRROLATE 0.2 MG/ML IJ SOLN
0.2000 mg | Freq: Once | INTRAMUSCULAR | Status: AC
  Administered 2021-02-13: 0.2 mg via INTRAVENOUS

## 2021-02-13 MED ORDER — SODIUM CHLORIDE (PF) 0.9 % IJ SOLN
INTRAMUSCULAR | Status: AC
  Filled 2021-02-13: qty 10

## 2021-02-13 MED ORDER — ROPIVACAINE HCL 2 MG/ML IJ SOLN
INTRAMUSCULAR | Status: AC
  Filled 2021-02-13: qty 10

## 2021-02-13 MED ORDER — MIDAZOLAM HCL 5 MG/5ML IJ SOLN
INTRAMUSCULAR | Status: AC
  Filled 2021-02-13: qty 5

## 2021-02-13 NOTE — Progress Notes (Signed)
Safety precautions to be maintained throughout the outpatient stay will include: orient to surroundings, keep bed in low position, maintain call bell within reach at all times, provide assistance with transfer out of bed and ambulation.  

## 2021-02-13 NOTE — Progress Notes (Signed)
PROVIDER NOTE: Information contained herein reflects review and annotations entered in association with encounter. Interpretation of such information and data should be left to medically-trained personnel. Information provided to patient can be located elsewhere in the medical record under "Patient Instructions". Document created using STT-dictation technology, any transcriptional errors that may result from process are unintentional.    Patient: Dana Bradley  Service Category: Procedure  Provider: Gaspar Cola, MD  DOB: [birth year]  DOS: 02/13/2021  Location: [facility]  MRN: [id number]  Setting: Ambulatory - outpatient  Referring Provider: Delsa Grana, PA-C  Type: Established Patient  Specialty: Interventional Pain Management  PCP: Delsa Grana, PA-C   Primary Reason for Visit: Interventional Pain Management Treatment. CC: Neck Pain  Procedure:          Anesthesia, Analgesia, Anxiolysis:  Type: Therapeutic, Inter-Laminar, Cervical Epidural Steroid Injection  #3  Region: Posterior Cervico-thoracic Region Level: C7-T1 Laterality: Left-Sided Paramedial  Type: Moderate (Conscious) Sedation combined with Local Anesthesia Indication(s): Analgesia and Anxiety Route: Intravenous (IV) IV Access: Secured Sedation: Meaningful verbal contact was maintained at all times during the procedure  Local Anesthetic: Lidocaine 1-2%  Position: Prone with head of the table was raised to facilitate breathing.   Indications: 1. DDD (degenerative disc disease), cervical   2. Cervical spondylosis with myelopathy and radiculopathy   3. Chronic cervical radicular pain (Bilateral) (L>R)   4. Cervicalgia   5. Cervicogenic headache   6. Cervico-occipital neuralgia (Left)   7. Chronic pain of both upper extremities   8. Chronic upper back pain (2ry area of Pain) (Bilateral) (L>R)   9. History of allergy to radiographic contrast media   10. Latex precautions, history of latex allergy   11.  History of vasovagal episode    Pain Score: Pre-procedure: 3 /10 Post-procedure: 3 /10   Pre-op H&P Assessment:  Dana Bradley is a 50 y.o. (year old), female patient, seen today for interventional treatment. She  has a past surgical history that includes Ablation; Tubal ligation (10/01/99); Cardiac catheterization; Knee arthroscopy; Colonoscopy with propofol (N/A, 05/17/2015); Esophagogastroduodenoscopy (N/A, 05/17/2015); spg; Tibial Tubercle Bypass (Right, 1998); and Colonoscopy with propofol (N/A, 03/20/2020). Dana Bradley has a current medication list which includes the following prescription(s): amitriptyline, atorvastatin, baclofen, butalbital-acetaminophen-caffeine, capsaicin-methyl sal-menthol, vitamin b-12, dicyclomine, dihydroergotamine, hydrocodone-acetaminophen, creon, magnesium oxide, medroxyprogesterone, metoprolol tartrate, mupirocin ointment, omeprazole, ondansetron, nurtec, valerian, vitamin d (ergocalciferol), diclofenac sodium, medroxyprogesterone, melatonin, pregabalin, pyridoxine, and sodium chloride 0.9 % SOLN 100 mL with Eptinezumab-jjmr 100 MG/ML SOLN 100 mg, and the following Facility-Administered Medications: fentanyl and midazolam. Her primarily concern today is the Neck Pain  Initial Vital Signs:  Pulse/HCG Rate: 89ECG Heart Rate: 78 Temp: 97.7 F (36.5 C) Resp: 18 BP: 114/75 SpO2: 100 %  BMI: Estimated body mass index is 38.97 kg/m as calculated from the following:   Height as of this encounter: 5\' 3"  (1.6 m).   Weight as of this encounter: 220 lb (99.8 kg).  Risk Assessment: Allergies: Reviewed. She is allergic to aspirin, cymbalta [duloxetine hcl], depakote [divalproex sodium], gadolinium derivatives, haloperidol, meperidine, reglan [metoclopramide], tramadol hcl, trazodone, compazine [prochlorperazine], meloxicam, penicillins, tomato, other, shellfish allergy, shellfish-derived products, bacitracin-neomycin-polymyxin, cephalosporins, ibuprofen, latex, neosporin  [neomycin-bacitracin zn-polymyx], nsaids, sulfa antibiotics, and sulfonamide derivatives.  Allergy Precautions: Latex-free protocol activated. No iodine containing solutions or radiological contrast used. Coagulopathies: Reviewed. None identified.  Blood-thinner therapy: None at this time Active Infection(s): Reviewed. None identified. Dana Bradley is afebrile  Site Confirmation: Dana Bradley was asked to confirm the procedure and laterality before marking  the site Procedure checklist: Completed Consent: Before the procedure and under the influence of no sedative(s), amnesic(s), or anxiolytics, the patient was informed of the treatment options, risks and possible complications. To fulfill our ethical and legal obligations, as recommended by the American Medical Association's Code of Ethics, I have informed the patient of my clinical impression; the nature and purpose of the treatment or procedure; the risks, benefits, and possible complications of the intervention; the alternatives, including doing nothing; the risk(s) and benefit(s) of the alternative treatment(s) or procedure(s); and the risk(s) and benefit(s) of doing nothing. The patient was provided information about the general risks and possible complications associated with the procedure. These may include, but are not limited to: failure to achieve desired goals, infection, bleeding, organ or nerve damage, allergic reactions, paralysis, and death. In addition, the patient was informed of those risks and complications associated to Spine-related procedures, such as failure to decrease pain; infection (i.e.: Meningitis, epidural or intraspinal abscess); bleeding (i.e.: epidural hematoma, subarachnoid hemorrhage, or any other type of intraspinal or peri-dural bleeding); organ or nerve damage (i.e.: Any type of peripheral nerve, nerve root, or spinal cord injury) with subsequent damage to sensory, motor, and/or autonomic systems, resulting in permanent  pain, numbness, and/or weakness of one or several areas of the body; allergic reactions; (i.e.: anaphylactic reaction); and/or death. Furthermore, the patient was informed of those risks and complications associated with the medications. These include, but are not limited to: allergic reactions (i.e.: anaphylactic or anaphylactoid reaction(s)); adrenal axis suppression; blood sugar elevation that in diabetics may result in ketoacidosis or comma; water retention that in patients with history of congestive heart failure may result in shortness of breath, pulmonary edema, and decompensation with resultant heart failure; weight gain; swelling or edema; medication-induced neural toxicity; particulate matter embolism and blood vessel occlusion with resultant organ, and/or nervous system infarction; and/or aseptic necrosis of one or more joints. Finally, the patient was informed that Medicine is not an exact science; therefore, there is also the possibility of unforeseen or unpredictable risks and/or possible complications that may result in a catastrophic outcome. The patient indicated having understood very clearly. We have given the patient no guarantees and we have made no promises. Enough time was given to the patient to ask questions, all of which were answered to the patient's satisfaction. Ms. Mcconahy has indicated that she wanted to continue with the procedure. Attestation: I, the ordering provider, attest that I have discussed with the patient the benefits, risks, side-effects, alternatives, likelihood of achieving goals, and potential problems during recovery for the procedure that I have provided informed consent. Date  Time: 02/13/2021  8:22 AM  Pre-Procedure Preparation:  Monitoring: As per clinic protocol. Respiration, ETCO2, SpO2, BP, heart rate and rhythm monitor placed and checked for adequate function Safety Precautions: Patient was assessed for positional comfort and pressure points before  starting the procedure. Time-out: I initiated and conducted the "Time-out" before starting the procedure, as per protocol. The patient was asked to participate by confirming the accuracy of the "Time Out" information. Verification of the correct person, site, and procedure were performed and confirmed by me, the nursing staff, and the patient. "Time-out" conducted as per Joint Commission's Universal Protocol (UP.01.01.01). Time: 0917  Description of Procedure:          Target Area: For Epidural Steroid injections the target is the interlaminar space, initially targeting the lower border of the superior vertebral body lamina. Approach: Paramedial approach. Area Prepped: Entire PosteriorCervical Region DuraPrep (Iodine  Povacrylex [0.7% available iodine] and Isopropyl Alcohol, 74% w/w) Safety Precautions: Aspiration looking for blood return was conducted prior to all injections. At no point did we inject any substances, as a needle was being advanced. No attempts were made at seeking any paresthesias. Safe injection practices and needle disposal techniques used. Medications properly checked for expiration dates. SDV (single dose vial) medications used. Description of the Procedure: Protocol guidelines were followed. The procedure needle was introduced through the skin, ipsilateral to the reported pain, and advanced to the target area. Bone was contacted and the needle walked caudad, until the lamina was cleared. The epidural space was identified using "loss-of-resistance technique" with 2-3 ml of PF-NaCl (0.9% NSS), in a 5cc LOR glass syringe. Vitals:   02/13/21 0928 02/13/21 0938 02/13/21 0948 02/13/21 0957  BP: (!) 115/94 (!) 106/46 108/65 114/61  Pulse:      Resp: 20 18 16 18   Temp:      TempSrc:      SpO2: 97% 99% 99% 100%  Weight:      Height:        Start Time: 0917 hrs. End Time: 0926 hrs. Materials:  Needle(s) Type: Epidural needle Gauge: 17G Length: 3.5-in Medication(s): Please see  orders for medications and dosing details.  Imaging Guidance (Spinal):          Type of Imaging Technique: Fluoroscopy Guidance (Spinal) Indication(s): Assistance in needle guidance and placement for procedures requiring needle placement in or near specific anatomical locations not easily accessible without such assistance. Exposure Time: Please see nurses notes. Contrast: None used. Fluoroscopic Guidance: I was personally present during the use of fluoroscopy. "Tunnel Vision Technique" used to obtain the best possible view of the target area. Parallax error corrected before commencing the procedure. "Direction-depth-direction" technique used to introduce the needle under continuous pulsed fluoroscopy. Once target was reached, antero-posterior, oblique, and lateral fluoroscopic projection used confirm needle placement in all planes. Images permanently stored in EMR. Interpretation: No contrast injected. I personally interpreted the imaging intraoperatively. Adequate needle placement confirmed in multiple planes. Permanent images saved into the patient's record.  Antibiotic Prophylaxis:   Anti-infectives (From admission, onward)   None     Indication(s): None identified  Post-operative Assessment:  Post-procedure Vital Signs:  Pulse/HCG Rate: 8975 Temp: 97.7 F (36.5 C) Resp: 18 BP: 114/61 SpO2: 100 %  EBL: None  Complications: No immediate post-treatment complications observed by team, or reported by patient.  Note: The patient tolerated the entire procedure well. A repeat set of vitals were taken after the procedure and the patient was kept under observation following institutional policy, for this type of procedure. Post-procedural neurological assessment was performed, showing return to baseline, prior to discharge. The patient was provided with post-procedure discharge instructions, including a section on how to identify potential problems. Should any problems arise concerning this  procedure, the patient was given instructions to immediately contact us, at any time, without hesitation. In any case, we plan to contact the patient by telephone for a follow-up status report regarding this interventional procedure.  Comments:  No additional relevant information.  Plan of Care  Orders:  Orders Placed This Encounter  Procedures  . Cervical Epidural Injection    Procedure: Cervical Epidural Steroid Injection/Block Purpose: Therapeutic Indication(s): Radiculitis and cervicalgia associater with cervical degenerative disc disease.    Scheduling Instructions:     Level(s): C7-T1     Laterality: Left-sided     Sedation: With Sedation.     Timeframe: Today  Order Specific Question:   Where will this procedure be performed?    Answer:   [facility]    Comments:   by Dr. Dossie Arbour  . [facility] C-ARM 1-60 MIN NO REPORT    Intraoperative interpretation by procedural physician at [facility].    Standing Status:   Standing    Number of Occurrences:   1    Order Specific Question:   Reason for exam:    Answer:   Assistance in needle guidance and placement for procedures requiring needle placement in or near specific anatomical locations not easily accessible without such assistance.  . Informed Consent Details: Physician/Practitioner Attestation; Transcribe to consent form and obtain patient signature    Nursing Order: Transcribe to consent form and obtain patient signature. Note: Always confirm laterality of pain with Ms. Fazekas, before procedure.    Order Specific Question:   Physician/Practitioner attestation of informed consent for procedure/surgical case    Answer:   I, the physician/practitioner, attest that I have discussed with the patient the benefits, risks, side effects, alternatives, likelihood of achieving goals and potential problems during recovery for the procedure that I have provided informed consent.    Order Specific Question:   Procedure     Answer:   Cervical Epidural Steroid Injection (CESI) under fluoroscopic guidance    Order Specific Question:   Physician/Practitioner performing the procedure    Answer:   Jaime Dome A. Dossie Arbour MD    Order Specific Question:   Indication/Reason    Answer:   Cervicalgia (Neck Pain) with or without Cervical Radiculopathy/Radiculitis (Arm/Shoulder Pain, Numbness, and/or weakness), secondary to Cervical and/or Cervicothoracic Degenerative Disc Disease (DDD), with or without Intervertebral Disc Displacement (IVD  . Provide equipment / supplies at bedside    "Epidural Tray" (Disposable  single use) Catheter: NOT required    Standing Status:   Standing    Number of Occurrences:   1    Order Specific Question:   Specify    Answer:   Epidural Tray  . Miscellanous precautions    Standing Status:   Standing    Number of Occurrences:   1  . Latex precautions    Activate Latex-Free Protocol.    Standing Status:   Standing    Number of Occurrences:   1   Chronic Opioid Analgesic:  Hydrocodone/APAP 7.5/325, 1 tab PO QD. she appears to be using less than 10 pills/month. MME/day:3.75mg /day.   Medications ordered for procedure: Meds ordered this encounter  Medications  . lidocaine (XYLOCAINE) 2 % (with pres) injection 400 mg  . lactated ringers infusion 1,000 mL  . midazolam (VERSED) 5 MG/5ML injection 1-2 mg    Make sure Flumazenil is available in the pyxis when using this medication. If oversedation occurs, administer 0.2 mg IV over 15 sec. If after 45 sec no response, administer 0.2 mg again over 1 min; may repeat at 1 min intervals; not to exceed 4 doses (1 mg)  . fentaNYL (SUBLIMAZE) injection 25-50 mcg    Make sure Narcan is available in the pyxis when using this medication. In the event of respiratory depression (RR< 8/min): Titrate NARCAN (naloxone) in increments of 0.1 to 0.2 mg IV at 2-3 minute intervals, until desired degree of reversal.  . glycopyrrolate (ROBINUL) injection 0.2 mg   . sodium chloride flush (NS) 0.9 % injection 1 mL  . ropivacaine (PF) 2 mg/mL (0.2%) (NAROPIN) injection 1 mL  . dexamethasone (DECADRON) injection 10 mg   Medications administered: We  administered lidocaine, lactated ringers, midazolam, fentaNYL, glycopyrrolate, sodium chloride flush, ropivacaine (PF) 2 mg/mL (0.2%), and dexamethasone.  See the medical record for exact dosing, route, and time of administration.  Follow-up plan:   Return in about 2 weeks (around 02/27/2021) for afternoon (VV) procedure day (PPE).       Interventional Therapies  Risk  Complexity Considerations:   Estimated body mass index is 38.97 kg/m as calculated from the following:   Height as of this encounter: 5\' 3"  (1.6 m).   Weight as of this encounter: 220 lb (99.8 kg). WNL   Planned  Pending:   Pending further evaluation   Under consideration:   Therapeutic left cervical ESI #3  Therapeutic right L4-5 LESI #2    Completed:   Neurosurgical referral for decompressive laminectomy and/or discectomy Therapeutic left C7-T1 cervical ESI x2 (10/30/2016)  Therapeutic bilateral lumbar facet block x1 (01/06/2017)  Therapeutic left SI joint block x2 (08/03/2019)  Therapeutic right L1-2 LESI x1 (12/16/2017)  Therapeutic left L1-2 LESI x1 (04/07/2017)  Therapeutic bilateral L2 TFESI x2 (11/09/2020)  Therapeutic left L4-5 LESI x1 (09/07/2019)  Therapeutic left trapezius muscle TPI/MNB x3 (03/19/2018)  Therapeutic right trapezius muscle trigger point injection x1 (11/18/2017)  Therapeutic left occipital nerve RFA x1 (12/25/2017)  Therapeutic left C2 + TON RFA x1 (12/25/2017)  Therapeutic left caudal ESI x1 (09/28/2019)  Therapeutic right T8-9 thoracic ESI x1 (11/09/2020)    Therapeutic  Palliative (PRN) options:   Diagnostic left SI joint block #2  Diagnostic/therapeutic left IA hip joint injection #2  Diagnostic bilateral lumbar facet block #2  Palliative left trapezius muscle MNB #4 Palliative right trapezius muscle  MNB #2 Palliative left L1-2 LESI #2 Palliative right L1-2 LESI #2 Palliativeleft CESI #3 Palliative left greater occipital NB #3  Palliative left C2 + TON NB #2  Palliative left GON + C2 + TON RFA #2 (last done 12/25/2017) Diagnostic/therapeutic bilateral L2 TFESI #2 (done on 03/07/2020) (100/100/100/>50)     Recent Visits Date Type Provider Dept  02/05/21 Office Visit Milinda Pointer, MD [facility]  Showing recent visits within past 90 days and meeting all other requirements Today's Visits Date Type Provider Dept  02/13/21 Procedure visit Milinda Pointer, MD [facility]  Showing today's visits and meeting all other requirements Future Appointments Date Type Provider Dept  03/01/21 Appointment Milinda Pointer, MD [facility]  03/13/21 Appointment Milinda Pointer, MD [facility]  Showing future appointments within next 90 days and meeting all other requirements  Disposition: Discharge home  Discharge (Date  Time): 02/13/2021; 1000 hrs.   Primary Care Physician: Delsa Grana, PA-C Location: [facility] Note by: Gaspar Cola, MD Date: 02/13/2021; Time: 10:51 AM  Disclaimer:  Medicine is not an exact science. The only guarantee in medicine is that nothing is guaranteed. It is important to note that the decision to proceed with this intervention was based on the information collected from the patient. The Data and conclusions were drawn from the patient's questionnaire, the interview, and the physical examination. Because the information was provided in large part by the patient, it cannot be guaranteed that it has not been purposely or unconsciously manipulated. Every effort has been made to obtain as much relevant data as possible for this evaluation. It is important to note that the conclusions that lead to this procedure are derived in large part from the available data. Always take into account  that the treatment will also be dependent on availability of resources  and existing treatment guidelines, considered by other Pain Management Practitioners as being common knowledge and practice, at the time of the intervention. For Medico-Legal purposes, it is also important to point out that variation in procedural techniques and pharmacological choices are the acceptable norm. The indications, contraindications, technique, and results of the above procedure should only be interpreted and judged by a Board-Certified Interventional Pain Specialist with extensive familiarity and expertise in the same exact procedure and technique.

## 2021-02-13 NOTE — Patient Instructions (Addendum)
____________________________________________________________________________________________  Post-Procedure Discharge Instructions  Instructions:  Apply ice:   Purpose: This will minimize any swelling and discomfort after procedure.   When: Day of procedure, as soon as you get home.  How: Fill a plastic sandwich bag with crushed ice. Cover it with a small towel and apply to injection site.  How long: (15 min on, 15 min off) Apply for 15 minutes then remove x 15 minutes.  Repeat sequence on day of procedure, until you go to bed.  Apply heat:   Purpose: To treat any soreness and discomfort from the procedure.  When: Starting the next day after the procedure.  How: Apply heat to procedure site starting the day following the procedure.  How long: May continue to repeat daily, until discomfort goes away.  Food intake: Start with clear liquids (like water) and advance to regular food, as tolerated.   Physical activities: Keep activities to a minimum for the first 8 hours after the procedure. After that, then as tolerated.  Driving: If you have received any sedation, be responsible and do not drive. You are not allowed to drive for 24 hours after having sedation.  Blood thinner: (Applies only to those taking blood thinners) You may restart your blood thinner 6 hours after your procedure.  Insulin: (Applies only to Diabetic patients taking insulin) As soon as you can eat, you may resume your normal dosing schedule.  Infection prevention: Keep procedure site clean and dry. Shower daily and clean area with soap and water.  Post-procedure Pain Diary: Extremely important that this be done correctly and accurately. Recorded information will be used to determine the next step in treatment. For the purpose of accuracy, follow these rules:  Evaluate only the area treated. Do not report or include pain from an untreated area. For the purpose of this evaluation, ignore all other areas of pain,  except for the treated area.  After your procedure, avoid taking a long nap and attempting to complete the pain diary after you wake up. Instead, set your alarm clock to go off every hour, on the hour, for the initial 8 hours after the procedure. Document the duration of the numbing medicine, and the relief you are getting from it.  Do not go to sleep and attempt to complete it later. It will not be accurate. If you received sedation, it is likely that you were given a medication that may cause amnesia. Because of this, completing the diary at a later time may cause the information to be inaccurate. This information is needed to plan your care.  Follow-up appointment: Keep your post-procedure follow-up evaluation appointment after the procedure (usually 2 weeks for most procedures, 6 weeks for radiofrequencies). DO NOT FORGET to bring you pain diary with you.   Expect: (What should I expect to see with my procedure?)  From numbing medicine (AKA: Local Anesthetics): Numbness or decrease in pain. You may also experience some weakness, which if present, could last for the duration of the local anesthetic.  Onset: Full effect within 15 minutes of injected.  Duration: It will depend on the type of local anesthetic used. On the average, 1 to 8 hours.   From steroids (Applies only if steroids were used): Decrease in swelling or inflammation. Once inflammation is improved, relief of the pain will follow.  Onset of benefits: Depends on the amount of swelling present. The more swelling, the longer it will take for the benefits to be seen. In some cases, up to 10 days.    Duration: Steroids will stay in the system x 2 weeks. Duration of benefits will depend on multiple posibilities including persistent irritating factors.  Side-effects: If present, they may typically last 2 weeks (the duration of the steroids).  Frequent: Cramps (if they occur, drink Gatorade and take over-the-counter Magnesium 450-500 mg  once to twice a day); water retention with temporary weight gain; increases in blood sugar; decreased immune system response; increased appetite.  Occasional: Facial flushing (red, warm cheeks); mood swings; menstrual changes.  Uncommon: Long-term decrease or suppression of natural hormones; bone thinning. (These are more common with higher doses or more frequent use. This is why we prefer that our patients avoid having any injection therapies in other practices.)   Very Rare: Severe mood changes; psychosis; aseptic necrosis.  From procedure: Some discomfort is to be expected once the numbing medicine wears off. This should be minimal if ice and heat are applied as instructed.  Call if: (When should I call?)  You experience numbness and weakness that gets worse with time, as opposed to wearing off.  New onset bowel or bladder incontinence. (Applies only to procedures done in the spine)  Emergency Numbers:  Durning business hours (Monday - Thursday, 8:00 AM - 4:00 PM) (Friday, 9:00 AM - 12:00 Noon): [phone]  After hours: [phone]  NOTE: If you are having a problem and are unable connect with, or to talk to a provider, then go to your nearest urgent care or emergency department. If the problem is serious and urgent, please call 911. ____________________________________________________________________________________________   ____________________________________________________________________________________________  Virtual Visits   What is a "Virtual Visit"? It is a Metallurgist (medical visit) that takes place on real time (NOT TEXT or E-MAIL) over the telephone or computer device (desktop, laptop, tablet, smart phone, etc.). It allows for more location flexibility between the patient and the healthcare provider.  Who decides when these types of visits will be used? The physician.  Who is eligible for these types of visits? Only those patients  that can be reliably reached over the telephone.  What do you mean by reliably? We do not have time to call everyone multiple times, therefore those that tend to screen calls and then call back later are not suitable candidates for this system. We understand how people are reluctant to pickup on "unknown" calls, therefore, we suggest adding our telephone numbers to your list of "CONTACT(s)". This way, you should be able to readily identify our calls when you receive one. All of our numbers are available below.   Who is not eligible? This option is not available for medication management encounters, specially for controlled substances. Patients on pain medications that fall under the category of controlled substances have to come in for "Face-to-Face" encounters. This is required for mandatory monitoring of these substances. You may be asked to provide a sample for an unannounced urine drug screening test (UDS), and we will need to count your pain pills. Not bringing your pills to be counted may result in no refill. Obviously, neither one of these can be done over the phone.  When will this type of visits be used? You can request a virtual visit whenever you are physically unable to attend a regular appointment. The decision will be made by the physician (or healthcare provider) on a case by case basis.   At what time will I be called? This is an excellent question. The providers will try to call you whenever they have time available. Do  not expect to be called at any specific time. The secretaries will assign you a time for your virtual visit appointment, but this is done simply to keep a list of those patients that need to be called, but not for the purpose of keeping a time schedule. Be advised that the call may come in anytime during the day, between the hours of 8:00 AM and 8::00 PM, depending on provider availability. We do understand that the system is not perfect. If you are unable to be available  that day on a moments notice, then request an "in-person" appointment rather than a "virtual visit".  Can I request my medication visits to be "Virtual"? Yes you may request it, but the decision is entirely up to the healthcare provider. Control substances require specific monitoring that requires Face-to-Face encounters. The number of encounters  and the extent of the monitoring is determined on a case by case basis.  Add a new contact to your smart phone and label it "PAIN CLINIC" Under this contact add the following numbers: Main: [phone] (Official Contact Number) Nurses: [phone] (These are outgoing only calling systems. Do not call this number.) Dr. Dossie Arbour: [phone] or [phone] (Outgoing calls only. Do not call this number.)  ____________________________________________________________________________________________   Pain Management Discharge Instructions  General Discharge Instructions :  If you need to reach your doctor call: Monday-Friday 8:00 am - 4:00 pm at [phone] or toll free [phone].  After clinic hours [phone] to have operator reach doctor.  Bring all of your medication bottles to all your appointments in the pain clinic.  To cancel or reschedule your appointment with Pain Management please remember to call 24 hours in advance to avoid a fee.  Refer to the educational materials which you have been given on: General Risks, I had my Procedure. Discharge Instructions, Post Sedation.  Post Procedure Instructions:  The drugs you were given will stay in your system until tomorrow, so for the next 24 hours you should not drive, make any legal decisions or drink any alcoholic beverages.  You may eat anything you prefer, but it is better to start with liquids then soups and crackers, and gradually work up to solid foods.  Please notify your doctor immediately if you have any unusual bleeding, trouble breathing or pain that is not  related to your normal pain.  Depending on the type of procedure that was done, some parts of your body may feel week and/or numb.  This usually clears up by tonight or the next day.  Walk with the use of an assistive device or accompanied by an adult for the 24 hours.  You may use ice on the affected area for the first 24 hours.  Put ice in a Ziploc bag and cover with a towel and place against area 15 minutes on 15 minutes off.  You may switch to heat after 24 hours.Epidural Steroid Injection Patient Information  Description: The epidural space surrounds the nerves as they exit the spinal cord.  In some patients, the nerves can be compressed and inflamed by a bulging disc or a tight spinal canal (spinal stenosis).  By injecting steroids into the epidural space, we can bring irritated nerves into direct contact with a potentially helpful medication.  These steroids act directly on the irritated nerves and can reduce swelling and inflammation which often leads to decreased pain.  Epidural steroids may be injected anywhere along the spine and from the neck to the low back depending  upon the location of your pain.   After numbing the skin with local anesthetic (like Novocaine), a small needle is passed into the epidural space slowly.  You may experience a sensation of pressure while this is being done.  The entire block usually last less than 10 minutes.  Conditions which may be treated by epidural steroids:   Low back and leg pain  Neck and arm pain  Spinal stenosis  Post-laminectomy syndrome  Herpes zoster (shingles) pain  Pain from compression fractures  Preparation for the injection:  1. Do not eat any solid food or dairy products within 8 hours of your appointment.  2. You may drink clear liquids up to 3 hours before appointment.  Clear liquids include water, black coffee, juice or soda.  No milk or cream please. 3. You may take your regular medication, including pain medications,  with a sip of water before your appointment  Diabetics should hold regular insulin (if taken separately) and take 1/2 normal NPH dos the morning of the procedure.  Carry some sugar containing items with you to your appointment. 4. A driver must accompany you and be prepared to drive you home after your procedure.  5. Bring all your current medications with your. 6. An IV may be inserted and sedation may be given at the discretion of the physician.   7. A blood pressure cuff, EKG and other monitors will often be applied during the procedure.  Some patients may need to have extra oxygen administered for a short period. 8. You will be asked to provide medical information, including your allergies, prior to the procedure.  We must know immediately if you are taking blood thinners (like Coumadin/Warfarin)  Or if you are allergic to IV iodine contrast (dye). We must know if you could possible be pregnant.  Possible side-effects:  Bleeding from needle site  Infection (rare, may require surgery)  Nerve injury (rare)  Numbness & tingling (temporary)  Difficulty urinating (rare, temporary)  Spinal headache ( a headache worse with upright posture)  Light -headedness (temporary)  Pain at injection site (several days)  Decreased blood pressure (temporary)  Weakness in arm/leg (temporary)  Pressure sensation in back/neck (temporary)  Call if you experience:  Fever/chills associated with headache or increased back/neck pain.  Headache worsened by an upright position.  New onset weakness or numbness of an extremity below the injection site  Hives or difficulty breathing (go to the emergency room)  Inflammation or drainage at the infection site  Severe back/neck pain  Any new symptoms which are concerning to you  Please note:  Although the local anesthetic injected can often make your back or neck feel good for several hours after the injection, the pain will likely return.  It takes  3-7 days for steroids to work in the epidural space.  You may not notice any pain relief for at least that one week.  If effective, we will often do a series of three injections spaced 3-6 weeks apart to maximally decrease your pain.  After the initial series, we generally will wait several months before considering a repeat injection of the same type.  If you have any questions, please call [phone] [facility]

## 2021-02-14 ENCOUNTER — Telehealth: Payer: Self-pay

## 2021-02-14 ENCOUNTER — Ambulatory Visit
Admission: RE | Admit: 2021-02-14 | Discharge: 2021-02-14 | Disposition: A | Discharge status: Home / Self Care | Payer: Self-pay | Source: Ambulatory Visit | Attending: Pain Medicine | Admitting: Pain Medicine

## 2021-02-14 ENCOUNTER — Other Ambulatory Visit: Payer: Self-pay | Admitting: Pain Medicine

## 2021-02-14 DIAGNOSIS — G959 Disease of spinal cord, unspecified: Secondary | ICD-10-CM

## 2021-02-14 DIAGNOSIS — M79601 Pain in right arm: Secondary | ICD-10-CM | POA: Insufficient documentation

## 2021-02-14 DIAGNOSIS — G4486 Cervicogenic headache: Secondary | ICD-10-CM

## 2021-02-14 DIAGNOSIS — M4712 Other spondylosis with myelopathy, cervical region: Secondary | ICD-10-CM

## 2021-02-14 DIAGNOSIS — M549 Dorsalgia, unspecified: Secondary | ICD-10-CM

## 2021-02-14 DIAGNOSIS — M79602 Pain in left arm: Secondary | ICD-10-CM | POA: Insufficient documentation

## 2021-02-14 DIAGNOSIS — G8929 Other chronic pain: Secondary | ICD-10-CM

## 2021-02-14 DIAGNOSIS — M4722 Other spondylosis with radiculopathy, cervical region: Secondary | ICD-10-CM | POA: Insufficient documentation

## 2021-02-14 DIAGNOSIS — M5412 Radiculopathy, cervical region: Secondary | ICD-10-CM | POA: Insufficient documentation

## 2021-02-14 DIAGNOSIS — M542 Cervicalgia: Secondary | ICD-10-CM

## 2021-02-14 NOTE — Telephone Encounter (Signed)
Post procedure phone call. Patient states that she is having worse pain in her head and is having a new pain running from the base of her skull down her spine to her pelvis.  Denies fever, inability to control bowel or bladder.  Dr Dossie Arbour notified. Stat Cervical MRI ordered and patinet notified.

## 2021-02-14 NOTE — Progress Notes (Signed)
Patient contacted today on follow-up after uneventful cervical epidural steroid injection yesterday, complaining today of worsening of myelopathy and radiculopathy symptoms with known history of a progressive C5-C6 right center disc extrusion.  Today I will order a stat MRI of the cervical spine to rule out possible postprocedure epidural hematoma.

## 2021-02-15 ENCOUNTER — Other Ambulatory Visit: Payer: Self-pay

## 2021-02-15 MED FILL — Medroxyprogesterone Acetate Tab 2.5 MG: ORAL | 30 days supply | Qty: 60 | Fill #1 | Status: AC

## 2021-02-16 ENCOUNTER — Other Ambulatory Visit: Payer: Self-pay

## 2021-02-28 ENCOUNTER — Other Ambulatory Visit: Payer: Self-pay

## 2021-02-28 ENCOUNTER — Encounter: Payer: Self-pay | Admitting: Pain Medicine

## 2021-02-28 DIAGNOSIS — R937 Abnormal findings on diagnostic imaging of other parts of musculoskeletal system: Secondary | ICD-10-CM | POA: Insufficient documentation

## 2021-02-28 NOTE — Progress Notes (Signed)
Unable to contact patient today.  Review of the chart shows a rather disturbing pattern of noncompliance with follow-up evaluations after procedures.  Unfortunately, this is hindering the process of collecting information from those diagnostic interventions for the purpose of eventually providing her with long-term benefit.

## 2021-02-28 NOTE — Progress Notes (Signed)
Please use this number for her VV on 03/01/2021     [phone]

## 2021-03-01 ENCOUNTER — Other Ambulatory Visit: Payer: Self-pay

## 2021-03-01 ENCOUNTER — Ambulatory Visit: Payer: Medicaid Other | Attending: Pain Medicine | Admitting: Pain Medicine

## 2021-03-01 DIAGNOSIS — G4486 Cervicogenic headache: Secondary | ICD-10-CM

## 2021-03-01 DIAGNOSIS — G8929 Other chronic pain: Secondary | ICD-10-CM

## 2021-03-01 DIAGNOSIS — M4712 Other spondylosis with myelopathy, cervical region: Secondary | ICD-10-CM

## 2021-03-01 DIAGNOSIS — G959 Disease of spinal cord, unspecified: Secondary | ICD-10-CM

## 2021-03-01 DIAGNOSIS — M5030 Other cervical disc degeneration, unspecified cervical region: Secondary | ICD-10-CM

## 2021-03-01 DIAGNOSIS — R937 Abnormal findings on diagnostic imaging of other parts of musculoskeletal system: Secondary | ICD-10-CM

## 2021-03-01 DIAGNOSIS — M5481 Occipital neuralgia: Secondary | ICD-10-CM

## 2021-03-01 DIAGNOSIS — G894 Chronic pain syndrome: Secondary | ICD-10-CM

## 2021-03-01 DIAGNOSIS — M542 Cervicalgia: Secondary | ICD-10-CM

## 2021-03-01 NOTE — Progress Notes (Signed)
Unsuccessful attempt to contact patient for Virtual Visit (Pain Management Telehealth)   Patient provided contact information:  [phone] (home); There is no such number on file (mobile).; (Preferred) [phone] [email or web address]   Pre-screening:  Our staff was successful in contacting Dana Bradley using the above provided information.   I unsuccessfully attempted to make contact with Dana Bradley on 3 separate occasions on 03/01/2021 via telephone. I was unable to complete the virtual encounter due to call going directly to voicemail. I was able to leave a message.  The information below comes from my precharting review as well as the nurses' assessment prior to the encounter.  (02/14/2021) CERVICAL MRI FINDINGS:  DISC LEVELS: C4-5: Shallow right paracentral disc protrusion. C5-6: Right paracentral disc protrusion contacts and and results in slight impress upon the ventral cord. Mild canal stenosis. C6-7: Small left paracentral disc protrusion contacting the ventral cord resulting in borderline left-sided canal stenosis.  IMPRESSION: 1. Unchanged small disc protrusions at the C4-5, C5-6, and C6-7 levels. 2. Mild canal stenosis at C5-6 and C6-7. No significant foraminal stenosis at any level.  Post-Procedure Evaluation  Procedure (02/13/2021): Therapeutic left C7-T1 cervical ESI #3 under fluoroscopic guidance and IV sedation Pre-procedure pain level: 3/10 Post-procedure: 3/10 No relief  Sedation: Sedation provided.  Effectiveness during initial hour after procedure(Ultra-Short Term Relief): 75 %.  Local anesthetic used: Long-acting (4-6 hours) Effectiveness: Defined as any analgesic benefit obtained secondary to the administration of local anesthetics. This carries significant diagnostic value as to the etiological location, or anatomical origin, of the pain. Duration of benefit is expected to coincide with the duration of the local anesthetic used.  Effectiveness during initial  4-6 hours after procedure(Short-Term Relief): 75 %.  Long-term benefit: Defined as any relief past the pharmacologic duration of the local anesthetics.  Effectiveness past the initial 6 hours after procedure(Long-Term Relief): 0 %.  Pharmacotherapy Assessment  Analgesic: Hydrocodone/APAP 7.5/325, 1 tab PO QD. she appears to be using less than 10 pills/month. MME/day:3.75mg /day.   Follow-up plan:   Reschedule Visit.     Interventional Therapies  Risk  Complexity Considerations:   Estimated body mass index is 38.97 kg/m as calculated from the following:   Height as of 02/13/21: 5\' 3"  (1.6 m).   Weight as of 02/13/21: 220 lb (99.8 kg). Allergy to contrast. Allergy to latex. Allergy to NSAIDs. History of vasovagal episodes with spinal manipulation.   Planned  Pending:   Pending further evaluation   Under consideration:   It would appear that this last therapeutic left C7-T1 cervical ESI # 3 (02/13/2021) did not provide her with any significant relief of the pain.  However, her very first cervical epidural steroid injection was done on 09/11/2016 and again caused a flareup of her pain.  Follow-up assessment indicated 80/60/70/> 50.  The procedure was then repeated on 10/30/2016 and although the patient did not keep up with the follow-up on that procedure, it would seem that her pain got under control and did not need any further interventions in the cervical region until 12/25/2017 when she had a left occipital nerve (Left) C2/TON RFA.  Unfortunately, the patient is rather noncompliant with follow-up evaluations after diagnostic and therapeutic procedures and therefore the information collected on those results is not available to contribute towards obtaining better results. Therapeutic left cervical ESI #4  Therapeutic right L4-5 LESI #2    Completed:   Neurosurgical referral for decompressive laminectomy and/or discectomy Therapeutic left C7-T1 cervical ESI x2 (10/30/2016)  Therapeutic  bilateral lumbar facet block  x1 (01/06/2017)  Therapeutic left SI joint block x2 (08/03/2019)  Therapeutic right L1-2 LESI x1 (12/16/2017)  Therapeutic left L1-2 LESI x1 (04/07/2017)  Therapeutic bilateral L2 TFESI x2 (11/09/2020)  Therapeutic left L4-5 LESI x1 (09/07/2019)  Therapeutic left trapezius muscle TPI/MNB x3 (03/19/2018)  Therapeutic right trapezius muscle trigger point injection x1 (11/18/2017)  Therapeutic left occipital nerve RFA x1 (12/25/2017)  Therapeutic left C2 + TON RFA x1 (12/25/2017)  Therapeutic left caudal ESI x1 (09/28/2019)  Therapeutic right T8-9 thoracic ESI x1 (11/09/2020)    Therapeutic  Palliative (PRN) options:   Diagnostic left SI joint block #2  Diagnostic/therapeutic left IA hip joint injection #2  Diagnostic bilateral lumbar facet block #2  Palliative left trapezius muscle MNB #4 Palliative right trapezius muscle MNB #2 Palliative left L1-2 LESI #2 Palliative right L1-2 LESI #2 Palliativeleft CESI #3 Palliative left greater occipital NB #3  Palliative left C2 + TON NB #2  Palliative left GON + C2 + TON RFA #2 (last done 12/25/2017) Diagnostic/therapeutic bilateral L2 TFESI #2 (done on 03/07/2020) (100/100/100/>50)      Recent Visits Date Type Provider Dept  02/13/21 Procedure visit Milinda Pointer, MD [facility]  02/05/21 Office Visit Milinda Pointer, MD [facility]  Showing recent visits within past 90 days and meeting all other requirements Today's Visits Date Type Provider Dept  03/01/21 Telemedicine Milinda Pointer, MD [facility]  Showing today's visits and meeting all other requirements Future Appointments Date Type Provider Dept  03/13/21 Appointment Milinda Pointer, MD [facility]  Showing future appointments within next 90 days and meeting all other requirements   Note by: Gaspar Cola, MD Date: 03/01/2021; Time: 7:21 PM

## 2021-03-06 ENCOUNTER — Encounter: Payer: Self-pay | Admitting: Physical Medicine and Rehabilitation

## 2021-03-06 ENCOUNTER — Encounter
Payer: Medicaid Other | Attending: Physical Medicine and Rehabilitation | Admitting: Physical Medicine and Rehabilitation

## 2021-03-06 VITALS — BP 122/79 | HR 75 | Temp 98.4°F | Ht 63.0 in | Wt 223.8 lb

## 2021-03-06 DIAGNOSIS — M7918 Myalgia, other site: Secondary | ICD-10-CM | POA: Diagnosis not present

## 2021-03-06 DIAGNOSIS — G43001 Migraine without aura, not intractable, with status migrainosus: Secondary | ICD-10-CM | POA: Insufficient documentation

## 2021-03-06 MED ORDER — BUTALBITAL-APAP-CAFFEINE 50-325-40 MG PO TABS: 1.0000 | ORAL_TABLET | Freq: Three times a day (TID) | ORAL | 0 refills | Status: DC | PRN

## 2021-03-06 MED ORDER — CARBAMAZEPINE ER 100 MG PO TB12: 100.0000 mg | ORAL_TABLET | Freq: Every day | ORAL | 3 refills | Status: DC

## 2021-03-06 MED ORDER — CYANOCOBALAMIN 1000 MCG/ML IJ SOLN: 1000.0000 ug | Freq: Once | INTRAMUSCULAR | 3 refills | Status: DC

## 2021-03-06 NOTE — Addendum Note (Signed)
Addended by: Izora Ribas on: 03/06/2021 11:22 AM   Modules accepted: Level of Service

## 2021-03-06 NOTE — Addendum Note (Signed)
Addended by: Izora Ribas on: 03/06/2021 11:21 AM   Modules accepted: Orders

## 2021-03-06 NOTE — Progress Notes (Addendum)
Subjective:    Patient ID: Dana Bradley, female    DOB: [birth year], 50 y.o.   MRN: [id number]  HPI   1) Chronic Migraine:  -Mrs. Mabry [place] a 50 year old woman who presents for f/u of her chronic migraine  Terrible stomach spasms and diarrhea. 12 hours later debilitating migraine. It usually starts in the afternoon. She feels that this may be part of her prodrome. She also has terrible vertigo that is getting worse. The Fiorcet helps a lot, the Migronal helps with the frontal headache. These make the pain bearable.  -having migraines close to every 24H -she is not functional -she knows it is stress related -she feels a pain radiating down her arm as well. It also runs into her head and into her face.  -Sometimes her whole left side is fizzy.  -she has tried Amitriptyline.  -she needs a refill of her Fioricet and nausea, she finds this helpful.  -Nurtec- she is still working with the company to get this improved -she asks about prodrome and postdrome (she experiences cramping and diarrhea).  -on Sunday she closed out a 56 hour migraines. -she has been trying to follow ketogenic diet.   2) Vertigo -Symptoms are worsening.  -She feels that this may be leading up to the vertigo.  -She is interested in vestibular therapy.   3) Cervical myofasical pain syndrome: -Muscles of neck are very tight, pain is worse on the left side.  -She did not find spinal injections helpful, they made the pain worse.   4) Insomnia: she sleeps in chunks -she has not tried Amitriptyline in years.   Mrs. Dills is a 50 year old woman who presents for follow-up of her chronic migraine. Her average pain is 6/10 and pain right now is 6/10. Her pain is intermittent, sharp, burning, stabbing, tingling, and aching.   She recently had 7 days without migraine. She is not sure what was associated with this. She had a pretty terrible bounceback.  She picked up the Migranal nasal spray. Our CMA explained how it works.    Her pain doctor is going to stop prescribing the Lyrica and she needs to find a new doctor to prescribe this.    Pain Inventory Average Pain 7 Pain Right Now 5 My pain is intermittent, sharp, burning, stabbing and tingling  In the last 24 hours, has pain interfered with the following? General activity 5 Relation with others 6 Enjoyment of life 7 What TIME of day is your pain at its worst? evening Sleep (in general) NA  Pain is worse with: some activites Pain improves with: not answered     Family History  Problem Relation Age of Onset  . Depression Mother   . Hypertension Mother   . Cancer Mother        Skin  . Hyperlipidemia Mother   . Anxiety disorder Mother   . Migraines Mother   . Alcohol abuse Father   . Depression Father   . Stroke Father   . Heart disease Father   . Hypertension Father   . Anxiety disorder Father   . Depression Sister   . Hyperlipidemia Sister   . Diabetes Sister   . Hypertension Sister   . Polycystic ovary syndrome Sister   . Bipolar disorder Sister   . Anxiety disorder Sister   . Migraines Sister   . Cancer Maternal Grandmother 66       Breast  . Thyroid disease Maternal Grandmother   . Arthritis  Maternal Grandmother   . Hyperlipidemia Maternal Grandmother   . Depression Sister   . Hypertension Sister   . Anxiety disorder Sister   . Migraines Sister   . Alzheimer's disease Other   . Aneurysm Maternal Grandfather   . Hypertension Maternal Grandfather   . Heart disease Maternal Grandfather   . Alzheimer's disease Paternal Grandmother   . Heart attack Paternal Grandfather   . Hypertension Paternal Grandfather   . COPD Paternal Grandfather   . Heart disease Paternal Grandfather   . Migraines Son   . Migraines Daughter   . Migraines Daughter   . Bladder Cancer Neg Hx   . Kidney cancer Neg Hx    Social History   Socioeconomic History  . Marital status: Married    Spouse name: brian  . Number of children: 3  . Years of  education: Not on file  . Highest education level: Associate degree: academic program  Occupational History  . Occupation: disbled    Comment: not able  Tobacco Use  . Smoking status: Former Smoker    Packs/day: 4.00    Years: 3.00    Pack years: 12.00    Types: Cigarettes    Quit date: 12/10/1992    Years since quitting: 28.2  . Smokeless tobacco: Never Used  . Tobacco comment: quit 25 years ago  Vaping Use  . Vaping Use: Never used  Substance and Sexual Activity  . Alcohol use: No    Alcohol/week: 0.0 standard drinks  . Drug use: No  . Sexual activity: Not Currently    Partners: Male    Birth control/protection: Surgical  Other Topics Concern  . Not on file  Social History Narrative   Lives at home with her husband and 2 of her children   Right handed   Caffeine: 0-2 cups daily   Social Determinants of Health   Financial Resource Strain: Not on file  Food Insecurity: Not on file  Transportation Needs: Not on file  Physical Activity: Not on file  Stress: Not on file  Social Connections: Not on file   Past Surgical History:  Procedure Laterality Date  . ABLATION     Uterine  . CARDIAC CATHETERIZATION     with ablation  . COLONOSCOPY WITH PROPOFOL N/A 05/17/2015   Procedure: COLONOSCOPY WITH PROPOFOL;  Surgeon: Manya Silvas, MD;  Location[facility] ENDOSCOPY;  Service: Endoscopy;  Laterality: N/A;  . COLONOSCOPY WITH PROPOFOL N/A 03/20/2020   Procedure: COLONOSCOPY WITH PROPOFOL;  Surgeon: Jonathon Bellows, MD;  Location[facility] ENDOSCOPY;  Service: Gastroenterology;  Laterality: N/A;  . ESOPHAGOGASTRODUODENOSCOPY N/A 05/17/2015   Procedure: ESOPHAGOGASTRODUODENOSCOPY (EGD);  Surgeon: Manya Silvas, MD;  Location: [facility] ENDOSCOPY;  Service: Endoscopy;  Laterality: N/A;  . KNEE ARTHROSCOPY    . spg     6/18  . Tibial Tubercle Bypass Right 1998  . TUBAL LIGATION  10/01/99   Past Surgical History:  Procedure Laterality Date  . ABLATION     Uterine  . CARDIAC  CATHETERIZATION     with ablation  . COLONOSCOPY WITH PROPOFOL N/A 05/17/2015   Procedure: COLONOSCOPY WITH PROPOFOL;  Surgeon: Manya Silvas, MD;  Location: [facility] ENDOSCOPY;  Service: Endoscopy;  Laterality: N/A;  . COLONOSCOPY WITH PROPOFOL N/A 03/20/2020   Procedure: COLONOSCOPY WITH PROPOFOL;  Surgeon: Jonathon Bellows, MD;  Location: [facility] ENDOSCOPY;  Service: Gastroenterology;  Laterality: N/A;  . ESOPHAGOGASTRODUODENOSCOPY N/A 05/17/2015   Procedure: ESOPHAGOGASTRODUODENOSCOPY (EGD);  Surgeon: Manya Silvas, MD;  Location: [place];  Service: Endoscopy;  Laterality: N/A;  . KNEE ARTHROSCOPY    . spg     6/18  . Tibial Tubercle Bypass Right 1998  . TUBAL LIGATION  10/01/99   Past Medical History:  Diagnosis Date  . Acute postoperative pain 04/07/2017  . Anxiety   . Bursitis   . Chronic fatigue 12/12/2017  . Chronic fatigue syndrome   . Colitis 2021  . Edema leg 05/02/2015  . Fibromyalgia   . GERD (gastroesophageal reflux disease)   . IBS (irritable bowel syndrome)   . Knee pain, bilateral 12/21/2008   Qualifier: Diagnosis of  By: Hassell Done FNP, Tori Milks    . Lumbar discitis   . Migraines   . Osteoarthritis   . Right hand pain 04/10/2015   [facility] has done nerve conduction studies and ruled out carpal tunnel.   . Sleep apnea   . Spinal stenosis   . SVT (supraventricular tachycardia) ([place])   . Vertigo   . Vitamin D deficiency 05/01/2016   BP 122/79   Pulse 75   Temp 98.4 F (36.9 C)   Ht 5\' 3"  (1.6 m)   Wt 223 lb 12.8 oz (101.5 kg)   SpO2 97%   BMI 39.64 kg/m   Opioid Risk Score:   Fall Risk Score:  `1  Depression screen PHQ 2/9  Depression screen [facility] 2/9 02/05/2021 01/19/2021 07/03/2020 05/24/2020 04/12/2020 03/13/2020 03/01/2020  Decreased Interest 0 0 1 0 0 1 0  Down, Depressed, Hopeless 0 0 1 0 0 0 0  PHQ - 2 Score 0 0 2 0 0 1 0  Altered sleeping - - 3 - - - -  Tired, decreased energy - - 3 - - - -  Change in appetite - - 2 - - - -  Feeling bad or failure  about yourself  - - 1 - - - -  Trouble concentrating - - 2 - - - -  Moving slowly or fidgety/restless - - 2 - - - -  Suicidal thoughts - - 0 - - - -  PHQ-9 Score - - - - - - -  Difficult doing work/chores - - Extremely dIfficult - - - -  Some recent data might be hidden   Review of Systems  Constitutional: Negative.   HENT: Negative.   Eyes: Negative.   Respiratory: Negative.   Cardiovascular: Negative.   Gastrointestinal: Negative.   Endocrine: Negative.   Genitourinary: Negative.   Musculoskeletal: Positive for back pain.       Left side of body from head to toes  Skin: Negative.   Allergic/Immunologic: Negative.   Neurological: Negative.   Hematological: Negative.   Psychiatric/Behavioral: Negative.   All other systems reviewed and are negative.      Objective:   Physical Exam Gen: no distress, normal appearing HEENT: oral mucosa pink and moist, NCAT Cardio: Reg rate Chest: normal effort, normal rate of breathing Abd: soft, non-distended Ext: no edema Psych: pleasant, normal affect Skin: intact Neuro: Alert and oriented x3 Musculoskeletal: Severe tightness in her left upper back/neck muscles. Limited range of motion in left arm due to shooting pain     Assessment & Plan:  Mrs. Sanmiguel is a 50 year old woman who presents for f/u with chronic intractable migraines s/p numerous treatments, severe fibromyalgia, and IBS, and nausea, and vertigo.    Vertigo: In the future, restart for cervical myofascial pain syndrome: myofascial release, postural correction, stretching and strengthening of the muscles of the neck and upper back,  development of HEP. Continue heating pads to muscles of upper back and neck. She is doing HEP.   Migraines: Refilled Fioricet, which is one of the medications that helps her. Refilled today. She takes this at least once every time she gets a migraine. Advised to use upon migraine onset and to not use more frequently than q6H during migraine.  Refilled Zofran for nausea last week, and this has been helping her. Continue metoprolol which can be helpful in migraine prophylaxis (HR is well controlled). Prescribed ergot nasal spray to try upon migraine initiation- advised no more frequently than 4 sprays per hour (still awaiting on prior auth). Discussed avoiding foods that may trigger migraines.  -Discussed that I can provide refill for her Lyrica when she needs it.  -When she gets the migraines her loss of words is getting worse.  -Ordered Vyepti- she will find out cost from her pharmacy. She will bring paperwork for this next visit.  -no benefits from Botox.  -continue to track migraines.  -Continue Baclofen for pain relief. Minimize use of Hydrocodone. She is only taking Lyrica once per day to make it last. Continue Migranal which is helping. -Discussed that Mrs. Waggoner's greatest source of happiness is her family. This community will be essential in helping her recover from her chronic pain and to increase her daily activity.Her daughter is also suffering from similar migraines unfortunately.  -Continue ginger, herbs, turmeric, blueberries, eating real food. Continue cutting down sugar. Using local honey is a great alternative. Discussed that exercise is one of the most effective treatments for fibromyalgia. This will also help with her obesity. Made goal with Mrs. Walsh to walk outside her home at least once per day, and to garden at least once per day (her favorite activity). Can use elliptical which she has at home on rainy days. The heat has been oppressive and so she has been trying to do the latter more.  Foods to alleviate migraine:  1) dark leafy greens 2) avocado 3) tuna 4) samon and mackerel 5) beans and legumes Supplements that can be helpful: feverfew, B12, and magnesium  Foods to avoid in migraine: 1) Excessive (or irregular timing) coffee 2) red wine 3) aged cheeses 4) chocolate 5) citrus fruits 6) aspartame and  other artifical sweeteners 7) yeast 8) MSG (in processed foods) 9) processed and cured meats 10) nuts and certain seeds 11) chicken livers and other organ meats 12) dairy products like buttermilk, sour cream, and yogurt 13) dried fruits like dates, figs, and raisins 14) garlic 15) onions 16) potato chips 17) pickled foods like olives and sauerkraut 18) some fresh fruits like ripe banana, papaya, red plums, raspberries, kiwi, pineapple 19) tomato-based products  Recommend to keep a migraine diary: rate daily the severity of your headache (1-10) and what foods you eat that day to help determine patterns.   Cervical facet arthrosis: Cervical XR normal- discussed results with patient. Pain is worse on left side.   Stress: -Her daughter was recently diagnosed with sarcoma and this is a great source of stress and fear to Mrs. [name]. It has been a turbulent time for her and this has understandably worsened her symptoms. Discussed benefits of gratitude journaling and she plans to try this.   Insomnia:  -Amitriptyline 10mg  HS prescribed- had nightmares for a period but this is helping  Nausea:  -refilled zofran -start B6  Cervical myofascial pain syndrome: -will not repeat injections  Trigeminal Neuralgia: - prescribed Carbamazepine.    All questions were  encouraged and answered. Follow up with mein 6-8 weeks.   40 minutes spend in discussion of her migraines, diet, cervical pain, insomnia, stressors, B12 deficiency

## 2021-03-06 NOTE — Patient Instructions (Addendum)
Foods to alleviate migraine:  1) dark leafy greens 2) avocado 3) tuna 4) samon and mackerel 5) beans and legumes Supplements that can be helpful: feverfew, B12, and magnesium  Foods to avoid in migraine: 1) Excessive (or irregular timing) coffee 2) red wine 3) aged cheeses 4) chocolate 5) citrus fruits 6) aspartame and other artifical sweeteners 7) yeast 8) MSG (in processed foods) 9) processed and cured meats 10) nuts and certain seeds 11) chicken livers and other organ meats 12) dairy products like buttermilk, sour cream, and yogurt 13) dried fruits like dates, figs, and raisins 14) garlic 15) onions 16) potato chips 17) pickled foods like olives and sauerkraut 18) some fresh fruits like ripe banana, papaya, red plums, raspberries, kiwi, pineapple 19) tomato-based products  Recommend to keep a migraine diary: rate daily the severity of your headache (1-10) and what foods you eat that day to help determine patterns.   [employer]

## 2021-03-07 ENCOUNTER — Other Ambulatory Visit: Payer: Self-pay

## 2021-03-07 MED ORDER — CARBAMAZEPINE ER 100 MG PO TB12
100.0000 mg | ORAL_TABLET | Freq: Every day | ORAL | 3 refills | Status: DC
  Filled 2021-03-07: qty 30, 30d supply, fill #0
  Filled 2021-04-10: qty 30, 30d supply, fill #1
  Filled 2021-05-15: qty 30, 30d supply, fill #2
  Filled 2021-06-18: qty 30, 30d supply, fill #3

## 2021-03-07 MED ORDER — CYANOCOBALAMIN 1000 MCG/ML IJ SOLN
1000.0000 ug | INTRAMUSCULAR | 3 refills | Status: DC
  Filled 2021-03-07: qty 1, 30d supply, fill #0
  Filled 2021-04-17: qty 1, 30d supply, fill #1
  Filled 2021-06-01: qty 1, 30d supply, fill #2

## 2021-03-07 MED ORDER — NEEDLES & SYRINGES MISC
0 refills | Status: DC
  Filled 2021-03-07: qty 30, fill #0

## 2021-03-07 NOTE — Addendum Note (Signed)
Addended by: Izora Ribas on: 03/07/2021 08:45 AM   Modules accepted: Orders

## 2021-03-08 ENCOUNTER — Other Ambulatory Visit: Payer: Self-pay

## 2021-03-13 ENCOUNTER — Ambulatory Visit: Payer: Medicaid Other | Admitting: Pain Medicine

## 2021-03-22 MED FILL — Medroxyprogesterone Acetate Tab 2.5 MG: ORAL | 30 days supply | Qty: 60 | Fill #2 | Status: AC

## 2021-03-23 ENCOUNTER — Other Ambulatory Visit: Payer: Self-pay

## 2021-04-05 ENCOUNTER — Telehealth: Payer: Self-pay

## 2021-04-05 NOTE — Telephone Encounter (Signed)
Please call [facility] and give them a verbal Rx for Vyepti. Per caller a verbal will be accepted.   Thank you very much.

## 2021-04-09 ENCOUNTER — Telehealth: Payer: Self-pay

## 2021-04-09 NOTE — Telephone Encounter (Signed)
Vyepti 100mg /ml has been approved by [employer] pt assistance program at no cost from .04/09/2021 to 09/29/2021

## 2021-04-09 NOTE — Telephone Encounter (Signed)
The new Rx has been signed and faxed.

## 2021-04-10 ENCOUNTER — Telehealth: Payer: Self-pay

## 2021-04-10 ENCOUNTER — Telehealth: Payer: Self-pay | Admitting: Physical Medicine and Rehabilitation

## 2021-04-10 ENCOUNTER — Other Ambulatory Visit: Payer: Self-pay

## 2021-04-10 ENCOUNTER — Other Ambulatory Visit: Payer: Self-pay | Admitting: Physical Medicine and Rehabilitation

## 2021-04-10 MED ORDER — AMITRIPTYLINE HCL 10 MG PO TABS
10.0000 mg | ORAL_TABLET | Freq: Every day | ORAL | 1 refills | Status: DC
  Filled 2021-04-10: qty 30, 30d supply, fill #0

## 2021-04-10 NOTE — Telephone Encounter (Signed)
Dana Bradley with Migraine assistance program needs to get more information on script.  Please call her at [phone].

## 2021-04-11 ENCOUNTER — Other Ambulatory Visit: Payer: Self-pay

## 2021-04-12 NOTE — Telephone Encounter (Signed)
done

## 2021-04-17 ENCOUNTER — Other Ambulatory Visit: Payer: Self-pay

## 2021-04-17 MED ORDER — MIGRANAL 4 MG/ML NA SOLN
NASAL | 3 refills | Status: DC
  Filled 2021-04-17: qty 8, 30d supply, fill #0
  Filled 2021-08-28: qty 8, 30d supply, fill #1

## 2021-04-18 ENCOUNTER — Other Ambulatory Visit: Payer: Self-pay

## 2021-04-19 ENCOUNTER — Other Ambulatory Visit: Payer: Self-pay

## 2021-04-26 MED FILL — Medroxyprogesterone Acetate Tab 2.5 MG: ORAL | 30 days supply | Qty: 60 | Fill #3 | Status: AC

## 2021-04-27 ENCOUNTER — Other Ambulatory Visit: Payer: Self-pay

## 2021-05-01 ENCOUNTER — Telehealth: Payer: Self-pay | Admitting: Family Medicine

## 2021-05-01 ENCOUNTER — Other Ambulatory Visit: Payer: Self-pay

## 2021-05-01 DIAGNOSIS — I10 Essential (primary) hypertension: Secondary | ICD-10-CM

## 2021-05-01 MED FILL — Metoprolol Tartrate Tab 50 MG: ORAL | 90 days supply | Qty: 180 | Fill #0 | Status: AC

## 2021-05-02 ENCOUNTER — Other Ambulatory Visit: Payer: Self-pay

## 2021-05-02 NOTE — Telephone Encounter (Signed)
Lvm to inform prescription has been sent to pharmacy and for her to return call to sch appt within the next 3 months.

## 2021-05-02 NOTE — Telephone Encounter (Signed)
Pt has scheduled an appt for 07/30/21

## 2021-05-09 ENCOUNTER — Other Ambulatory Visit: Payer: Self-pay | Admitting: Physical Medicine and Rehabilitation

## 2021-05-15 ENCOUNTER — Other Ambulatory Visit: Payer: Self-pay

## 2021-05-17 ENCOUNTER — Telehealth: Payer: Self-pay

## 2021-05-17 NOTE — Telephone Encounter (Signed)
Please call Vaughan Basta with [employer] patient assistance program for the migraine medication phone [phone]. The program will need to ask a few details. Such as when and where will the treatments start?

## 2021-05-21 ENCOUNTER — Other Ambulatory Visit: Payer: Self-pay | Admitting: Physical Medicine and Rehabilitation

## 2021-05-21 ENCOUNTER — Other Ambulatory Visit: Payer: Self-pay

## 2021-05-21 DIAGNOSIS — G43001 Migraine without aura, not intractable, with status migrainosus: Secondary | ICD-10-CM

## 2021-05-21 MED ORDER — ONDANSETRON HCL 4 MG PO TABS
4.0000 mg | ORAL_TABLET | Freq: Three times a day (TID) | ORAL | 2 refills | Status: DC | PRN
  Filled 2021-05-21: qty 20, 7d supply, fill #0

## 2021-05-21 MED ORDER — AMITRIPTYLINE HCL 25 MG PO TABS
25.0000 mg | ORAL_TABLET | Freq: Every day | ORAL | 3 refills | Status: DC
  Filled 2021-05-21: qty 30, 30d supply, fill #0

## 2021-05-25 ENCOUNTER — Other Ambulatory Visit: Payer: Self-pay | Admitting: Physical Medicine and Rehabilitation

## 2021-05-25 ENCOUNTER — Telehealth: Payer: Self-pay | Admitting: Physical Medicine and Rehabilitation

## 2021-05-25 NOTE — Telephone Encounter (Signed)
Linda with [employer] Migraine Patient Assistance Program needs a call about [facility] they couldn't pull patient up, didn't have anything on patient..  Please call her at [phone].

## 2021-05-28 ENCOUNTER — Other Ambulatory Visit: Payer: Self-pay

## 2021-05-28 MED FILL — Medroxyprogesterone Acetate Tab 2.5 MG: ORAL | 30 days supply | Qty: 60 | Fill #4 | Status: AC

## 2021-05-28 NOTE — Telephone Encounter (Signed)
Patient aware.

## 2021-05-29 ENCOUNTER — Other Ambulatory Visit: Payer: Self-pay

## 2021-06-01 ENCOUNTER — Other Ambulatory Visit: Payer: Self-pay

## 2021-06-01 ENCOUNTER — Telehealth: Payer: Self-pay | Admitting: Pharmacist

## 2021-06-01 NOTE — Telephone Encounter (Signed)
06/01/2021 2:50:04 PM - Creon refill called to [facility] - Friday, June 01, 2021 2:49 PM --Leanora Cover spoke with Andee Poles and placed refill for Creon, allow 7-10 business days to receive.

## 2021-06-05 ENCOUNTER — Other Ambulatory Visit: Payer: Self-pay

## 2021-06-11 ENCOUNTER — Other Ambulatory Visit: Payer: Self-pay

## 2021-06-12 ENCOUNTER — Other Ambulatory Visit: Payer: Self-pay

## 2021-06-13 ENCOUNTER — Other Ambulatory Visit: Payer: Self-pay

## 2021-06-18 ENCOUNTER — Other Ambulatory Visit: Payer: Self-pay

## 2021-06-18 ENCOUNTER — Encounter: Payer: Self-pay | Admitting: Family Medicine

## 2021-06-18 ENCOUNTER — Ambulatory Visit (INDEPENDENT_AMBULATORY_CARE_PROVIDER_SITE_OTHER): Payer: Self-pay | Admitting: Family Medicine

## 2021-06-18 VITALS — BP 126/78 | HR 104 | Temp 98.2°F | Resp 16 | Ht 63.0 in | Wt 219.4 lb

## 2021-06-18 DIAGNOSIS — R739 Hyperglycemia, unspecified: Secondary | ICD-10-CM

## 2021-06-18 DIAGNOSIS — M25522 Pain in left elbow: Secondary | ICD-10-CM

## 2021-06-18 DIAGNOSIS — Z5181 Encounter for therapeutic drug level monitoring: Secondary | ICD-10-CM

## 2021-06-18 DIAGNOSIS — E538 Deficiency of other specified B group vitamins: Secondary | ICD-10-CM

## 2021-06-18 DIAGNOSIS — R309 Painful micturition, unspecified: Secondary | ICD-10-CM

## 2021-06-18 DIAGNOSIS — Z23 Encounter for immunization: Secondary | ICD-10-CM

## 2021-06-18 DIAGNOSIS — R310 Gross hematuria: Secondary | ICD-10-CM

## 2021-06-18 DIAGNOSIS — Z1231 Encounter for screening mammogram for malignant neoplasm of breast: Secondary | ICD-10-CM

## 2021-06-18 DIAGNOSIS — K909 Intestinal malabsorption, unspecified: Secondary | ICD-10-CM

## 2021-06-18 DIAGNOSIS — E782 Mixed hyperlipidemia: Secondary | ICD-10-CM

## 2021-06-18 DIAGNOSIS — R631 Polydipsia: Secondary | ICD-10-CM

## 2021-06-18 LAB — POCT URINALYSIS DIPSTICK
Bilirubin, UA: NEGATIVE
Blood, UA: POSITIVE
Glucose, UA: NEGATIVE
Ketones, UA: NEGATIVE
Nitrite, UA: NEGATIVE
Odor: NORMAL
Protein, UA: NEGATIVE
Spec Grav, UA: 1.02 (ref 1.010–1.025)
Urobilinogen, UA: 0.2 E.U./dL
pH, UA: 6.5 (ref 5.0–8.0)

## 2021-06-18 LAB — POCT GLYCOSYLATED HEMOGLOBIN (HGB A1C): Hemoglobin A1C: 6.8 % — AB (ref 4.0–5.6)

## 2021-06-18 NOTE — Progress Notes (Signed)
Patient ID: Dana Bradley, female    DOB: 08-21-1971, 50 y.o.   MRN: [id number]  PCP: Delsa Grana, PA-C  Chief Complaint  Patient presents with  . Hyperglycemia    Thirsty, checked fasting sugar and running high  . Referral    Golfers elbow left  . Hematuria    Pain with urination     Subjective:   Dana Bradley is a 50 y.o. female, presents to clinic with CC of the following:  HPI   Left elbow pain doing PT for several weeks/months- she has not seen ortho No redness, swelling, fever   Lab Results  Component Value Date   HGBA1C 6.8 (A) 06/18/2021  DM:  new onset T2DM Cutting out carbs bread, potatoes- she is concerned with excessive thirst, dysuria Denies: Polyuria, polydipsia, vision changes, neuropathy, hypoglycemia Recent pertinent labs: Lab Results  Component Value Date   HGBA1C 6.8 (A) 06/18/2021   HGBA1C 5.8 (H) 02/22/2020   HGBA1C 5.8 (H) 08/01/2019   Lab Results  Component Value Date   LDLCALC 79 02/22/2020   CREATININE 0.65 02/22/2020   Standard of care and health maintenance: Urine Microalbumin: due Foot exam:  due DM eye exam:  due ACEI/ARB:  not on Statin:  yes    UA reviewed today   Results for orders placed or performed in visit on 06/18/21  POCT urinalysis dipstick  Result Value Ref Range   Color, UA dark yellow    Clarity, UA clear    Glucose, UA Negative Negative   Bilirubin, UA neg    Ketones, UA neg    Spec Grav, UA 1.020 1.010 - 1.025   Blood, UA positive    pH, UA 6.5 5.0 - 8.0   Protein, UA Negative Negative   Urobilinogen, UA 0.2 0.2 or 1.0 E.U./dL   Nitrite, UA neg    Leukocytes, UA Small (1+) (A) Negative   Appearance dark    Odor normal   POCT HgB A1C  Result Value Ref Range   Hemoglobin A1C 6.8 (A) 4.0 - 5.6 %   HbA1c POC (<> result, manual entry)     HbA1c, POC (prediabetic range)     HbA1c, POC (controlled diabetic range)    Urination burning bladder pressure 2 months  Chronic abd pain - no different from  normal, Bms no different from baseline - managed on Creon - Anna/GI  Mood worse with increased stress with daughter's cancer diagnosis and treatment Sees psych PHQ reviewed    Patient Active Problem List   Diagnosis Date Noted  . Abnormal MRI, cervical spine (02/14/2021) 02/28/2021  . Cervicalgia 02/13/2021  . Abnormal MRI, lumbar spine (03/16/2020) 11/09/2020  . Chronic midline thoracic back pain 11/09/2020  . Abnormal MRI, thoracic spine (08/02/2019) 11/09/2020  . Thoracic spinal stenosis (T7-8) 11/09/2020  . Prolapse of thoracic disc with radiculopathy (T7-8) 11/09/2020  . Spasm of muscle of lower back 11/09/2020  . Vertigo, benign paroxysmal, unspecified laterality 11/09/2020  . Uncomplicated opioid dependence ([place]) 11/08/2020  . Hyperalgesia 03/07/2020  . Neurogenic urinary incontinence 03/01/2020  . Other intervertebral disc degeneration, lumbar region 03/01/2020  . Spinal stenosis of lumbosacral region 03/01/2020  . Pharmacologic therapy 02/06/2020  . Lumbar radiculitis (Right) 09/21/2019  . Chronic lower extremity pain (Bilateral) 09/06/2019  . Intractable migraine with aura without status migrainosus 08/10/2019  . Other specified dorsopathies, sacral and sacrococcygeal region 08/03/2019  . Latex precautions, history of latex allergy 08/03/2019  . History of allergy to radiographic contrast media 08/03/2019  .  DDD (degenerative disc disease), cervical 07/21/2019  . Cervical facet syndrome (Bilateral) (L>R) 07/21/2019  . DDD (degenerative disc disease), thoracic 07/21/2019  . Osteoarthritis of hip (Left) 07/21/2019  . Chronic groin pain (Bilateral) (L>R) 07/21/2019  . Chronic hip pain (Bilateral) (L>R) 07/21/2019  . Somatic dysfunction of sacroiliac joint (Bilateral) 07/21/2019  . Migraine with aura and with status migrainosus, not intractable 04/06/2019  . Cervico-occipital neuralgia (Left) 04/06/2019  . Weakness of leg (Left) 04/05/2019  . Difficulty walking  04/05/2019  . Chronic migraine without aura, with intractable migraine, so stated, with status migrainosus 12/27/2018  . Malar rash 09/04/2018  . Trigger point of neck (Left) 03/19/2018  . Occipital headache 12/25/2017  . Chronic fatigue syndrome with fibromyalgia 12/12/2017  . Trigger point of shoulder region (Left) 11/17/2017  . Myofascial pain syndrome (Left) (trapezius muscle) 07/22/2017  . Lumbar L1-2 disc protrusion (Right) 04/07/2017  . Muscle spasticity 04/01/2017  . Osteoarthritis of shoulder (Bilateral) 04/01/2017  . Lumbar spondylosis 01/06/2017  . Chronic hip pain (Left) 12/24/2016  . Chronic sacroiliac joint pain (Left) 12/24/2016  . Lumbar facet syndrome (Bilateral) (L>R) 12/24/2016  . Lumbar radiculitis (Left) 12/24/2016  . Hypertriglyceridemia 11/27/2016  . History of vasovagal episode 10/30/2016  . Cervicogenic headache 09/09/2016  . Medication monitoring encounter 08/29/2016  . Controlled substance agreement signed 08/28/2016  . Plantar fasciitis of left foot 08/28/2016  . Vitamin B12 deficiency 08/28/2016  . Hyperlipidemia 08/28/2016  . Nephrolithiasis 08/12/2016  . Chronic pain syndrome 08/07/2016  . Long term prescription opiate use 08/07/2016  . Opiate use 08/07/2016  . Long term prescription benzodiazepine use 08/07/2016  . Neurogenic pain 08/07/2016  . Chronic low back pain (1ry area of Pain) (Bilateral) (R>L) (midline) 08/07/2016  . Chronic upper back pain (2ry area of Pain) (Bilateral) (L>R) 08/07/2016  . Chronic abdominal pain (Right lower quadrant) 08/07/2016  . Thoracic radiculitis (Bilateral: T10, T11) 08/07/2016  . Chronic occipital neuralgia (3ry area of Pain) (Bilateral) (L>R) 08/07/2016  . Chronic neck pain 08/07/2016  . Chronic cervical radicular pain (Bilateral) (L>R) 08/07/2016  . Chronic shoulder blade pain (Bilateral) (L>R) 08/07/2016  . Chronic upper extremity pain (Bilateral) (R>L) 08/07/2016  . Chronic knee pain (Bilateral) (R>L)  08/07/2016  . Chronic ankle pain (Bilateral) 08/07/2016  . Cervical spondylosis with myelopathy and radiculopathy 08/07/2016  . Panic disorder with agoraphobia 05/29/2016  . Depression, unspecified depression type 05/29/2016  . Atypical lymphocytosis 05/01/2016  . Vitamin D insufficiency 05/01/2016  . Chronic lower extremity cramps (Bilateral) (R>L) 04/29/2016  . Obesity 04/29/2016  . GAD (generalized anxiety disorder) 04/29/2016  . Fatigue 04/29/2016  . Insomnia 07/12/2015  . Migraine without aura and with status migrainosus, not intractable 07/12/2015  . Chronic superficial gastritis 06/02/2015  . Chronic pain of multiple joints 05/15/2015  . Bilateral leg edema 05/02/2015  . Paroxysmal supraventricular tachycardia ([place]) 04/17/2015  . Exertional shortness of breath 04/17/2015  . Bright red rectal bleeding 04/06/2015  . DDD (degenerative disc disease), lumbosacral 01/24/2014  . DDD (degenerative disc disease), lumbar 01/24/2014  . Cervico-occipital neuralgia 12/29/2013  . Fibromyalgia 12/29/2013  . Migraine headache 12/29/2013  . Menorrhagia 12/10/2012  . Depression, major, recurrent, in remission ([place]) 01/12/2009  . Chest pain 01/12/2009  . Hypertension, benign essential, goal below 140/90 06/23/2008  . History of PSVT (paroxysmal supraventricular tachycardia) 06/17/2008  . Obstructive sleep apnea 06/17/2008  . GERD 06/13/2008      Current Outpatient Medications:  .  amitriptyline (ELAVIL) 25 MG tablet, Take 1 tablet (25 mg total) by  mouth once daily at bedtime., Disp: 30 tablet, Rfl: 3 .  atorvastatin (LIPITOR) 40 MG tablet, TAKE 1 TABLET (40 MG TOTAL) BY MOUTH AT BEDTIME., Disp: 90 tablet, Rfl: 3 .  butalbital-acetaminophen-caffeine (FIORICET) 50-325-40 MG tablet, Take 1 tablet by mouth every 8 (eight) hours as needed for headache., Disp: 40 tablet, Rfl: 0 .  carbamazepine (TEGRETOL-XR) 100 MG 12 hr tablet, Take 1 tablet (100 mg total) by mouth at bedtime., Disp: 30 tablet,  Rfl: 3 .  Cyanocobalamin (VITAMIN B-12) 500 MCG SUBL, Place 1,500 mcg under the tongue daily. , Disp: 150 tablet, Rfl:  .  dicyclomine (BENTYL) 10 MG capsule, Take 1 capsule (10 mg total) by mouth 4 (four) times daily as needed (before meals and at bedtime), Disp: 360 capsule, Rfl: 1 .  lipase/protease/amylase (CREON) 12000-38000 units CPEP capsule, Take 2 capsules by mouth with the first bite of each meal and 1 capsule before snacks. (Patient taking differently: Take 2 capsules by mouth with the first bite of each meal and 1 capsule before snacks.), Disp: 240 capsule, Rfl: 1 .  metoprolol tartrate (LOPRESSOR) 50 MG tablet, Take 1 tablet (50 mg total) by mouth 2 (two) times daily., Disp: 180 tablet, Rfl: 0 .  MIGRANAL 4 MG/ML nasal spray, Spray 1 spray into one nostril as needed for migraine as directed. No more than 4 sprays in one hour. (Patient taking differently: Place 1 spray into the nose as needed.), Disp: 8 mL, Rfl: 3 .  Needles & Syringes MISC, For administration of B12 injections, Disp: 30 each, Rfl: 0 .  omeprazole (PRILOSEC) 40 MG capsule, Take 1 capsule (40 mg total) by mouth daily., Disp: 90 capsule, Rfl: 3 .  ondansetron (ZOFRAN) 4 MG tablet, Take 1 tablet (4 mg total) by mouth every 8 (eight) hours as needed., Disp: 20 tablet, Rfl: 2 .  Rimegepant Sulfate (NURTEC) 75 MG TBDP, Take 1 tablet by mouth once every other day., Disp: 30 tablet, Rfl: 3 .  sodium chloride 0.9 % SOLN 100 mL with Eptinezumab-jjmr 100 MG/ML SOLN 100 mg, Inject 100 mg into the vein every 3 (three) months., Disp: 1 each, Rfl: 3 .  VALERIAN PO, Take by mouth as needed. Makes Valerian tea about 3-4 times per week., Disp: , Rfl:  .  baclofen (LIORESAL) 10 MG tablet, TAKE ONE TABLET BY MOUTH 3 TIMES A DAY, Disp: 90 tablet, Rfl: 2 .  diclofenac Sodium (VOLTAREN) 1 % GEL, Apply 2 g topically 4 (four) times daily as needed., Disp: 350 g, Rfl: PRN .  medroxyPROGESTERone (PROVERA) 5 MG tablet, Take 1 tablet (5 mg total) by  mouth daily., Disp: 30 tablet, Rfl: 11 .  pregabalin (LYRICA) 150 MG capsule, Take 1 capsule (150 mg total) by mouth every 8 (eight) hours., Disp: 90 capsule, Rfl: 2   Allergies  Allergen Reactions  . Aspirin Swelling  . Cymbalta [Duloxetine Hcl] Other (See Comments)    Suicidal ideations and has homicidal thoughts per patient  . Depakote [Divalproex Sodium] Shortness Of Breath    W/ n/v  . Gadolinium Derivatives     Pt was unable to breath  . Haloperidol Shortness Of Breath    W/ n/v  . Meperidine Nausea And Vomiting    Patient projectile vomits and usually result in ER  . Reglan [Metoclopramide] Shortness Of Breath    Can't breath, wheezes  . Tramadol Hcl Palpitations    Severely and adversely affects her SVT giving her tachycardias of 150-160 bpm.  . Trazodone Shortness Of  Breath  . Compazine [Prochlorperazine] Other (See Comments)    Panic attack  . Meloxicam Other (See Comments)    mouth sores, tingling, blisters in mouth  . Penicillins Rash  . Tomato Hives    Tongue will blister  . Other   . Shellfish Allergy     Other reaction(s): Unknown  . Shellfish-Derived Products Other (See Comments)  . Bacitracin-Neomycin-Polymyxin Rash  . Cephalosporins Rash    rash  . Ibuprofen Other (See Comments) and Rash    Blisters in mouth. Blisters in mouth.  . Latex Itching  . Neosporin [Neomycin-Bacitracin Zn-Polymyx] Rash  . Nsaids Other (See Comments)    Blisters in mouth; can take 1 ibuprofen 2x a month  . Sulfa Antibiotics Rash    rash Other reaction(s): Unknown rash   . Sulfonamide Derivatives Rash     Social History   Tobacco Use  . Smoking status: Former    Packs/day: 4.00    Years: 3.00    Pack years: 12.00    Types: Cigarettes    Quit date: 12/10/1992    Years since quitting: 28.5  . Smokeless tobacco: Never  . Tobacco comments:    quit 25 years ago  Vaping Use  . Vaping Use: Never used  Substance Use Topics  . Alcohol use: No    Alcohol/week: 0.0  standard drinks  . Drug use: No      Chart Review Today: I personally reviewed active problem list, medication list, allergies, family history, social history, health maintenance, notes from last encounter, lab results, imaging with the patient/caregiver today.   Review of Systems     Objective:   Vitals:   06/18/21 1519 06/18/21 1545  BP: 126/78   Pulse: (!) 119 (!) 104  Resp: 16   Temp: 98.2 F (36.8 C)   SpO2: 97%   Weight: 219 lb 6.4 oz (99.5 kg)   Height: '5\' 3"'$  (1.6 m)     Body mass index is 38.86 kg/m.  Physical Exam Vitals and nursing note reviewed.  Constitutional:      General: She is not in acute distress.    Appearance: Normal appearance. She is obese. She is not ill-appearing, toxic-appearing or diaphoretic.  HENT:     Head: Normocephalic and atraumatic.     Right Ear: External ear normal.     Left Ear: External ear normal.  Eyes:     General: No scleral icterus.       Right eye: No discharge.        Left eye: No discharge.     Conjunctiva/sclera: Conjunctivae normal.  Cardiovascular:     Rate and Rhythm: Regular rhythm. Tachycardia present.     Pulses: Normal pulses.     Heart sounds: Normal heart sounds. No murmur heard.   No friction rub. No gallop.  Pulmonary:     Effort: Pulmonary effort is normal. No respiratory distress.     Breath sounds: Normal breath sounds. No wheezing or rales.  Abdominal:     General: Bowel sounds are normal. There is no distension.     Palpations: Abdomen is soft.     Tenderness: There is no abdominal tenderness. There is no right CVA tenderness, left CVA tenderness or guarding.  Skin:    General: Skin is warm and dry.     Coloration: Skin is not jaundiced or pale.     Findings: No erythema, lesion or rash.  Neurological:     Mental Status: She is alert. Mental status is  at baseline.     Gait: Gait normal.  Psychiatric:        Mood and Affect: Mood normal.        Behavior: Behavior normal.     Results for  orders placed or performed in visit on 06/18/21  POCT urinalysis dipstick  Result Value Ref Range   Color, UA dark yellow    Clarity, UA clear    Glucose, UA Negative Negative   Bilirubin, UA neg    Ketones, UA neg    Spec Grav, UA 1.020 1.010 - 1.025   Blood, UA positive    pH, UA 6.5 5.0 - 8.0   Protein, UA Negative Negative   Urobilinogen, UA 0.2 0.2 or 1.0 E.U./dL   Nitrite, UA neg    Leukocytes, UA Small (1+) (A) Negative   Appearance dark    Odor normal   POCT HgB A1C  Result Value Ref Range   Hemoglobin A1C 6.8 (A) 4.0 - 5.6 %   HbA1c POC (<> result, manual entry)     HbA1c, POC (prediabetic range)     HbA1c, POC (controlled diabetic range)         Assessment & Plan:     ICD-10-CM   1. Hyperglycemia  R73.9 POCT HgB A1C    Comprehensive metabolic panel    2. Encounter for screening mammogram for malignant neoplasm of breast  Z12.31 MM 3D SCREEN BREAST BILATERAL    3. Need for influenza vaccination  Z23 Flu Vaccine QUAD 6+ mos PF IM (Fluarix Quad PF)    4. Gross hematuria  R31.0 POCT urinalysis dipstick    CBC with Differential/Platelet    Comprehensive metabolic panel   with GI issues will wait for urine culture before treating, abd exam reassuring    5. Painful urination  R30.9 POCT urinalysis dipstick    Urine Culture    CBC with Differential/Platelet    Comprehensive metabolic panel   dip + culture pending    6. Increased thirst  R63.1 POCT HgB A1C    Comprehensive metabolic panel   discussed glycemic control, checking renal function and monitoring    7. Left elbow pain  M25.522 Ambulatory referral to Orthopedics   failed PT, refer to ortho for further eval and tx    8. Intestinal malabsorption, unspecified type  K90.9 CBC with Differential/Platelet    Comprehensive metabolic panel   managed by GI    9. Mixed hyperlipidemia  E78.2 Lipid panel    Comprehensive metabolic panel   on statin, due for recheck of labs    10. Vitamin B12 deficiency   E53.8 CBC with Differential/Platelet    Comprehensive metabolic panel    11. Encounter for medication monitoring  Z51.81 CBC with Differential/Platelet    Lipid panel    Comprehensive metabolic panel         Delsa Grana, PA-C 06/18/21 4:00 PM

## 2021-06-19 LAB — URINE CULTURE
MICRO NUMBER:: 12392352
SPECIMEN QUALITY:: ADEQUATE

## 2021-06-22 ENCOUNTER — Other Ambulatory Visit: Payer: Self-pay | Admitting: Physical Medicine and Rehabilitation

## 2021-06-25 ENCOUNTER — Other Ambulatory Visit: Payer: Self-pay

## 2021-06-25 ENCOUNTER — Encounter
Payer: Medicaid Other | Attending: Physical Medicine and Rehabilitation | Admitting: Physical Medicine and Rehabilitation

## 2021-06-25 ENCOUNTER — Encounter: Payer: Self-pay | Admitting: Physical Medicine and Rehabilitation

## 2021-06-25 VITALS — BP 121/85 | HR 85 | Temp 98.8°F | Ht 63.0 in | Wt 221.4 lb

## 2021-06-25 DIAGNOSIS — M5442 Lumbago with sciatica, left side: Secondary | ICD-10-CM | POA: Insufficient documentation

## 2021-06-25 DIAGNOSIS — G8929 Other chronic pain: Secondary | ICD-10-CM | POA: Diagnosis not present

## 2021-06-25 DIAGNOSIS — G500 Trigeminal neuralgia: Secondary | ICD-10-CM | POA: Insufficient documentation

## 2021-06-25 DIAGNOSIS — M5441 Lumbago with sciatica, right side: Secondary | ICD-10-CM | POA: Insufficient documentation

## 2021-06-25 DIAGNOSIS — G43001 Migraine without aura, not intractable, with status migrainosus: Secondary | ICD-10-CM | POA: Diagnosis not present

## 2021-06-25 MED ORDER — AMITRIPTYLINE HCL 50 MG PO TABS
50.0000 mg | ORAL_TABLET | Freq: Every day | ORAL | 3 refills | Status: DC
  Filled 2021-06-25: qty 30, 30d supply, fill #0
  Filled 2021-08-06: qty 30, 30d supply, fill #1

## 2021-06-25 NOTE — Progress Notes (Signed)
Subjective:    Patient ID: Dana Bradley, female    DOB: July 27, 1971, 50 y.o.   MRN: [id number]  HPI   1) Chronic Migraine:  -Dana Bradley Dana Bradley a 50 year old woman who presents for f/u of her chronic migraine  Terrible stomach spasms and diarrhea. 12 hours later debilitating migraine. It usually starts in the afternoon. She feels that this may be part of her prodrome. She also has terrible vertigo that is getting worse. The Fiorcet helps a lot, the Migronal helps with the frontal headache. These make the pain bearable.  -having migraines close to every 24H -she is not functional -she knows it is stress related -she feels a pain radiating down her arm as well. It also runs into her head and into her face.  -Sometimes her whole left side is fizzy.  -she has tried Amitriptyline.  -she needs a refill of her Fioricet and nausea, she finds this helpful.  -Nurtec- she is still working with the company to get this improved -she asks about prodrome and postdrome (she experiences cramping and diarrhea).  -on Sunday she closed out a 56 hour migraines. -she has been trying to follow ketogenic diet.  -Nurtec helping- 5 days in a row without a migraines.  -she feels if she could take it as a rescue medication in addition to every other day this may help.   2) Vertigo -Symptoms are worsening.  -She feels that this may be leading up to the vertigo.  -She is interested in vestibular therapy.   3) Cervical myofasical pain syndrome: -Muscles of neck are very tight, pain is worse on the left side.  -She did not find spinal injections helpful, they made the pain worse.   4) Insomnia: she sleeps in chunks -she is willing to increase amitriptyline to 50mg .   5) Cognitive impairments: -she loves to cook and noticed she has been making more mistakes -she thinks the headaches are a problem.  6) Pain radiating from back into legs -feels twitches -worse at night -legs kick out at night.   Dana Bradley is a 50  year old woman who presents for follow-up of her chronic migraine. Her average pain is 6/10 and pain right now is 6/10. Her pain is intermittent, sharp, burning, stabbing, tingling, and aching.   She recently had 7 days without migraine. She is not sure what was associated with this. She had a pretty terrible bounceback.  She picked up the Migranal nasal spray. Our CMA explained how it works.   Her pain doctor is going to stop prescribing the Lyrica and she needs to find a new doctor to prescribe this.    Pain Inventory Average Pain 6 Pain Right Now 5 My pain is intermittent, sharp, burning, stabbing and tingling  In the last 24 hours, has pain interfered with the following? General activity 4 Relation with others 5 Enjoyment of life 7 What TIME of day is your pain at its worst? evening Sleep (in general) Fair  Pain is worse with: bending and some activites Pain improves with: rest and medication Relief from Meds: 5     Family History  Problem Relation Age of Onset   Depression Mother    Hypertension Mother    Cancer Mother        Skin   Hyperlipidemia Mother    Anxiety disorder Mother    Migraines Mother    Alcohol abuse Father    Depression Father    Stroke Father  Heart disease Father    Hypertension Father    Anxiety disorder Father    Depression Sister    Hyperlipidemia Sister    Diabetes Sister    Hypertension Sister    Polycystic ovary syndrome Sister    Bipolar disorder Sister    Anxiety disorder Sister    Migraines Sister    Cancer Maternal Grandmother 82       Breast   Thyroid disease Maternal Grandmother    Arthritis Maternal Grandmother    Hyperlipidemia Maternal Grandmother    Depression Sister    Hypertension Sister    Anxiety disorder Sister    Migraines Sister    Alzheimer's disease Other    Aneurysm Maternal Grandfather    Hypertension Maternal Grandfather    Heart disease Maternal Grandfather    Alzheimer's disease Paternal  Grandmother    Heart attack Paternal Grandfather    Hypertension Paternal Grandfather    COPD Paternal Grandfather    Heart disease Paternal Grandfather    Migraines Son    Migraines Daughter    Migraines Daughter    Bladder Cancer Neg Hx    Kidney cancer Neg Hx    Social History   Socioeconomic History   Marital status: Married    Spouse name: brian   Number of children: 3   Years of education: Not on file   Highest education level: Associate degree: academic program  Occupational History   Occupation: disbled    Comment: not able  Tobacco Use   Smoking status: Former    Packs/day: 4.00    Years: 3.00    Pack years: 12.00    Types: Cigarettes    Quit date: 12/10/1992    Years since quitting: 28.5   Smokeless tobacco: Never   Tobacco comments:    quit 25 years ago  Vaping Use   Vaping Use: Never used  Substance and Sexual Activity   Alcohol use: No    Alcohol/week: 0.0 standard drinks   Drug use: No   Sexual activity: Not Currently    Partners: Male    Birth control/protection: Surgical  Other Topics Concern   Not on file  Social History Narrative   Lives at home with her husband and 2 of her children   Right handed   Caffeine: 0-2 cups daily   Social Determinants of Health   Financial Resource Strain: Not on file  Food Insecurity: Not on file  Transportation Needs: Not on file  Physical Activity: Not on file  Stress: Not on file  Social Connections: Not on file   Past Surgical History:  Procedure Laterality Date   ABLATION     Uterine   CARDIAC CATHETERIZATION     with ablation   COLONOSCOPY WITH PROPOFOL N/A 05/17/2015   Procedure: COLONOSCOPY WITH PROPOFOL;  Surgeon: Manya Silvas, MD;  Location: [facility] ENDOSCOPY;  Service: Endoscopy;  Laterality: N/A;   COLONOSCOPY WITH PROPOFOL N/A 03/20/2020   Procedure: COLONOSCOPY WITH PROPOFOL;  Surgeon: Jonathon Bellows, MD;  Location[facility] END[facility]COPY;  Service: Gastroenterology;  Laterality: N/A;    ESOPHAGOGASTRODUODENOSCOPY N/A 05/17/2015   Procedure: ESOPHAGOGASTRODUODENOSCOPY (EGD);  Surgeon: Manya Silvas, MD;  Location: [facility] ENDOSCOPY;  Service: Endoscopy;  Laterality: N/A;   KNEE ARTHROSCOPY     spg     6/18   Tibial Tubercle Bypass Right 1998   TUBAL LIGATION  10/01/99   Past Surgical History:  Procedure Laterality Date   ABLATION     Uterine   CARDIAC CATHETERIZATION  with ablation   COLONOSCOPY WITH PROPOFOL N/A 05/17/2015   Procedure: COLONOSCOPY WITH PROPOFOL;  Surgeon: Manya Silvas, MD;  Location: [facility] ENDOSCOPY;  Service: Endoscopy;  Laterality: N/A;   COLONOSCOPY WITH PROPOFOL N/A 03/20/2020   Procedure: COLONOSCOPY WITH PROPOFOL;  Surgeon: Jonathon Bellows, MD;  Location: [facility] ENDOSCOPY;  Service: Gastroenterology;  Laterality: N/A;   ESOPHAGOGASTRODUODENOSCOPY N/A 05/17/2015   Procedure: ESOPHAGOGASTRODUODENOSCOPY (EGD);  Surgeon: Manya Silvas, MD;  Location: [facility] ENDOSCOPY;  Service: Endoscopy;  Laterality: N/A;   KNEE ARTHROSCOPY     spg     6/18   Tibial Tubercle Bypass Right 1998   TUBAL LIGATION  10/01/99   Past Medical History:  Diagnosis Date   Acute postoperative pain 04/07/2017   Anxiety    Bursitis    Chronic fatigue 12/12/2017   Chronic fatigue syndrome    Colitis 2021   Edema leg 05/02/2015   Fibromyalgia    GERD (gastroesophageal reflux disease)    IBS (irritable bowel syndrome)    Knee pain, bilateral 12/21/2008   Qualifier: Diagnosis of  By: Hassell Done FNP, [place]     Lumbar discitis    Migraines    Osteoarthritis    Right hand pain 04/10/2015   [facility] has done nerve conduction studies and ruled out carpal tunnel.    Sleep apnea    Spinal stenosis    SVT (supraventricular tachycardia) (HCC)    Vertigo    Vitamin D deficiency 05/01/2016   BP 121/85   Pulse 85   Temp 98.8 F (37.1 C) (Oral)   Ht 5\' 3"  (1.6 m)   Wt 221 lb 6.4 oz (100.4 kg)   SpO2 97%   BMI 39.22 kg/m   Opioid Risk Score:   Fall Risk Score:   `1  Depression screen PHQ 2/9  Depression screen [facility] 2/9 06/25/2021 06/18/2021 03/06/2021 02/05/2021 01/19/2021 07/03/2020 05/24/2020  Decreased Interest 0 2 0 0 0 1 0  Down, Depressed, Hopeless 0 2 0 0 0 1 0  PHQ - 2 Score 0 4 0 0 0 2 0  Altered sleeping - 3 - - - 3 -  Tired, decreased energy - 3 - - - 3 -  Change in appetite - 2 - - - 2 -  Feeling bad or failure about yourself  - 2 - - - 1 -  Trouble concentrating - 3 - - - 2 -  Moving slowly or fidgety/restless - 0 - - - 2 -  Suicidal thoughts - 0 - - - 0 -  PHQ-9 Score - 17 - - - - -  Difficult doing work/chores - Very difficult - - - Extremely dIfficult -  Some recent data might be hidden   Review of Systems  Constitutional: Negative.   HENT: Negative.    Eyes: Negative.   Respiratory: Negative.    Cardiovascular: Negative.   Gastrointestinal: Negative.   Endocrine: Negative.   Genitourinary: Negative.   Musculoskeletal:  Positive for back pain.       Left side of body from head to toes  Skin: Negative.   Allergic/Immunologic: Negative.   Neurological: Negative.   Hematological: Negative.   Psychiatric/Behavioral: Negative.    All other systems reviewed and are negative.     Objective:   Physical Exam Gen: no distress, normal appearing HEENT: oral mucosa pink and moist, NCAT Cardio: Reg rate Chest: normal effort, normal rate of breathing Abd: soft, non-distended Ext: no edema Psych: pleasant, normal affect Skin: intact Neuro: Alert and oriented  x3 Musculoskeletal: Severe tightness in her left upper back/neck muscles. Limited range of motion in left arm due to shooting pain. +Slump test     Assessment & Plan:  Dana Bradley is a 50 year old woman who presents for f/u with chronic intractable migraines s/p numerous treatments, severe fibromyalgia, and IBS, and nausea, and vertigo.     Vertigo: In the future, restart for cervical myofascial pain syndrome: myofascial release, postural correction, stretching and  strengthening of the muscles of the neck and upper back, development of HEP. Continue heating pads to muscles of upper back and neck. She is doing HEP.    Migraines: Refilled Fioricet, which is one of the medications that helps her. Refilled today. She takes this at least once every time she gets a migraine. Advised to use upon migraine onset and to not use more frequently than q6H during migraine. Refilled Zofran for nausea last week, and this has been helping her. Continue metoprolol which can be helpful in migraine prophylaxis (HR is well controlled). Prescribed ergot nasal spray to try upon migraine initiation- advised no more frequently than 4 sprays per hour (still awaiting on prior auth). Discussed avoiding foods that may trigger migraines.  -Discussed that I can provide refill for her Lyrica when she needs it.  -When she gets the migraines her loss of words is getting worse.  -Ordered Vyepti- she will find out cost from her pharmacy. She will bring paperwork for this next visit.  -no benefits from Botox.  -continue to track migraines.  -Continue Baclofen for pain relief. Minimize use of Hydrocodone. She is only taking Lyrica once per day to make it last. Continue Migranal which is helping. -Discussed that Dana Bradley greatest source of happiness is her family. This community will be essential in helping her recover from her chronic pain and to increase her daily activity. Her daughter is also suffering from similar migraines unfortunately.  -Continue ginger, herbs, turmeric, blueberries, eating real food. Continue cutting down sugar. Using local honey is a great alternative. -Continue Nurtec every other day- may use for breakthrough migraines as well.  -Discussed plan for Vypeti at [facility] infusion center.  Discussed that exercise is one of the most effective treatments for fibromyalgia. This will also help with her obesity. Made goal with Dana Bradley to walk outside her home at least once  per day, and to garden at least once per day (her favorite activity). Can use elliptical which she has at home on rainy days. The heat has been oppressive and so she has been trying to do the latter more.  Foods to alleviate migraine:  1) dark leafy greens 2) avocado 3) tuna 4) samon and mackerel 5) beans and legumes Supplements that can be helpful: feverfew, B12, and magnesium  Foods to avoid in migraine: 1) Excessive (or irregular timing) coffee 2) red wine 3) aged cheeses 4) chocolate 5) citrus fruits 6) aspartame and other artifical sweeteners 7) yeast 8) MSG (in processed foods) 9) processed and cured meats 10) nuts and certain seeds 11) chicken livers and other organ meats 12) dairy products like buttermilk, sour cream, and yogurt 13) dried fruits like dates, figs, and raisins 14) garlic 15) onions 16) potato chips 17) pickled foods like olives and sauerkraut 18) some fresh fruits like ripe banana, papaya, red plums, raspberries, kiwi, pineapple 19) tomato-based products  Recommend to keep a migraine diary: rate daily the severity of your headache (1-10) and what foods you eat that day to help determine  patterns.    Cervical facet arthrosis: Cervical XR normal- discussed results with patient. Pain is worse on left side.   Stress: -Her daughter was recently diagnosed with sarcoma and this is a great source of stress and fear to Dana Bradley. It has been a turbulent time for her and this has understandably worsened her symptoms. Discussed benefits of gratitude journaling and she plans to try this.   Insomnia:  -increase amitirptyline to 50mg .  -Try to go outside near sunrise -Get exercise during the day.  -Discussed good sleep hygiene: turning off all devices an hour before bedtime.  -Chamomile tea with dinner.  -Can consider over the counter melatonin -discussed magnesium  Nausea:  -refilled zofran -start B6   Cervical myofascial pain syndrome: -will not repeat  injections  Trigeminal Neuralgia: - continue Carbamazepine.  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 Shallow broad-based disc protrusion L4-5 unchanged. -continue Norco -continue turmeric Turmeric to reduce inflammation--can be used in cooking or taken as a supplement.  Benefits of turmeric:  -Highly anti-inflammatory  -Increases antioxidants  -Improves memory, attention, brain disease  -Lowers risk of heart disease  -May help prevent cancer  -Decreases pain  -Alleviates depression  -Delays aging and decreases risk of chronic disease  -Consume with black pepper to increase absorption    Turmeric Milk Recipe:  1 cup milk  1 tsp turmeric  1 tsp cinnamon  1 tsp grated ginger (optional)  Black pepper (boosts the anti-inflammatory properties of turmeric).  1 tsp honey

## 2021-06-25 NOTE — Patient Instructions (Addendum)
-  Try to go outside near sunrise -Get exercise during the day.  -Discussed good sleep hygiene: turning off all devices an hour before bedtime.  -Chamomile tea with dinner.  -Can consider over the counter melatonin   Magnesium glycinate, Bioptemizer's  Turmeric to reduce inflammation--can be used in cooking or taken as a supplement.  Benefits of turmeric:  -Highly anti-inflammatory  -Increases antioxidants  -Improves memory, attention, brain disease  -Lowers risk of heart disease  -May help prevent cancer  -Decreases pain  -Alleviates depression  -Delays aging and decreases risk of chronic disease  -Consume with black pepper to increase absorption

## 2021-06-28 ENCOUNTER — Telehealth: Payer: Self-pay | Admitting: Pharmacist

## 2021-06-28 NOTE — Telephone Encounter (Signed)
--   Elmer Picker - Thursday, June 28, 2021 10:58 AM --Leanora Cover spoke with Santiago Glad for refill on Creon-allow 7-10 business days for [place] to receive at our address.

## 2021-07-05 ENCOUNTER — Other Ambulatory Visit: Payer: Self-pay

## 2021-07-05 DIAGNOSIS — M797 Fibromyalgia: Secondary | ICD-10-CM

## 2021-07-05 MED ORDER — PREGABALIN 150 MG PO CAPS: 150.0000 mg | ORAL_CAPSULE | Freq: Three times a day (TID) | ORAL | 2 refills | Status: DC

## 2021-07-05 NOTE — Telephone Encounter (Signed)
Please send Lyrica refill to [place] in chart. Thank you.

## 2021-07-06 ENCOUNTER — Other Ambulatory Visit: Payer: Self-pay

## 2021-07-06 ENCOUNTER — Ambulatory Visit (INDEPENDENT_AMBULATORY_CARE_PROVIDER_SITE_OTHER): Payer: Self-pay | Admitting: Internal Medicine

## 2021-07-06 ENCOUNTER — Telehealth: Payer: Self-pay | Admitting: Obstetrics and Gynecology

## 2021-07-06 ENCOUNTER — Other Ambulatory Visit
Admission: RE | Admit: 2021-07-06 | Discharge: 2021-07-06 | Disposition: A | Discharge status: Home / Self Care | Payer: Medicaid Other | Attending: Family Medicine | Admitting: Family Medicine

## 2021-07-06 ENCOUNTER — Other Ambulatory Visit: Payer: Self-pay | Admitting: Pharmacy Technician

## 2021-07-06 ENCOUNTER — Encounter: Payer: Self-pay | Admitting: Internal Medicine

## 2021-07-06 VITALS — BP 122/86 | HR 84 | Temp 98.1°F | Resp 16 | Ht 63.0 in | Wt 220.3 lb

## 2021-07-06 DIAGNOSIS — E782 Mixed hyperlipidemia: Secondary | ICD-10-CM | POA: Diagnosis not present

## 2021-07-06 DIAGNOSIS — R631 Polydipsia: Secondary | ICD-10-CM | POA: Insufficient documentation

## 2021-07-06 DIAGNOSIS — R739 Hyperglycemia, unspecified: Secondary | ICD-10-CM | POA: Diagnosis not present

## 2021-07-06 DIAGNOSIS — Z5181 Encounter for therapeutic drug level monitoring: Secondary | ICD-10-CM | POA: Insufficient documentation

## 2021-07-06 DIAGNOSIS — E538 Deficiency of other specified B group vitamins: Secondary | ICD-10-CM | POA: Insufficient documentation

## 2021-07-06 DIAGNOSIS — N644 Mastodynia: Secondary | ICD-10-CM

## 2021-07-06 DIAGNOSIS — Z803 Family history of malignant neoplasm of breast: Secondary | ICD-10-CM

## 2021-07-06 DIAGNOSIS — K909 Intestinal malabsorption, unspecified: Secondary | ICD-10-CM | POA: Insufficient documentation

## 2021-07-06 DIAGNOSIS — N6311 Unspecified lump in the right breast, upper outer quadrant: Secondary | ICD-10-CM

## 2021-07-06 DIAGNOSIS — R310 Gross hematuria: Secondary | ICD-10-CM | POA: Diagnosis not present

## 2021-07-06 DIAGNOSIS — R309 Painful micturition, unspecified: Secondary | ICD-10-CM | POA: Insufficient documentation

## 2021-07-06 LAB — CBC WITH DIFFERENTIAL/PLATELET
Abs Immature Granulocytes: 0.13 10*3/uL — ABNORMAL HIGH (ref 0.00–0.07)
Basophils Absolute: 0.1 10*3/uL (ref 0.0–0.1)
Basophils Relative: 1 %
Eosinophils Absolute: 0.1 10*3/uL (ref 0.0–0.5)
Eosinophils Relative: 1 %
HCT: 40.5 % (ref 36.0–46.0)
Hemoglobin: 13.9 g/dL (ref 12.0–15.0)
Immature Granulocytes: 2 %
Lymphocytes Relative: 23 %
Lymphs Abs: 2 10*3/uL (ref 0.7–4.0)
MCH: 33.1 pg (ref 26.0–34.0)
MCHC: 34.3 g/dL (ref 30.0–36.0)
MCV: 96.4 fL (ref 80.0–100.0)
Monocytes Absolute: 0.6 10*3/uL (ref 0.1–1.0)
Monocytes Relative: 8 %
Neutro Abs: 5.6 10*3/uL (ref 1.7–7.7)
Neutrophils Relative %: 65 %
Platelets: 242 10*3/uL (ref 150–400)
RBC: 4.2 MIL/uL (ref 3.87–5.11)
RDW: 13.2 % (ref 11.5–15.5)
WBC: 8.5 10*3/uL (ref 4.0–10.5)
nRBC: 0 % (ref 0.0–0.2)

## 2021-07-06 LAB — COMPREHENSIVE METABOLIC PANEL
ALT: 37 U/L (ref 0–44)
AST: 28 U/L (ref 15–41)
Albumin: 4.2 g/dL (ref 3.5–5.0)
Alkaline Phosphatase: 109 U/L (ref 38–126)
Anion gap: 10 (ref 5–15)
BUN: 11 mg/dL (ref 6–20)
CO2: 27 mmol/L (ref 22–32)
Calcium: 9.1 mg/dL (ref 8.9–10.3)
Chloride: 103 mmol/L (ref 98–111)
Creatinine, Ser: 0.64 mg/dL (ref 0.44–1.00)
GFR, Estimated: >60 mL/min (ref >60)
Glucose, Bld: 133 mg/dL — ABNORMAL HIGH (ref 70–99)
Potassium: 3.6 mmol/L (ref 3.5–5.1)
Sodium: 140 mmol/L (ref 135–145)
Total Bilirubin: 0.6 mg/dL (ref 0.3–1.2)
Total Protein: 6.8 g/dL (ref 6.5–8.1)

## 2021-07-06 LAB — LIPID PANEL
Cholesterol: 185 mg/dL (ref 0–200)
HDL: 39 mg/dL — ABNORMAL LOW (ref >40)
LDL Cholesterol: 82 mg/dL (ref 0–99)
Total CHOL/HDL Ratio: 4.7 RATIO
Triglycerides: 318 mg/dL — ABNORMAL HIGH (ref <150)
VLDL: 64 mg/dL — ABNORMAL HIGH (ref 0–40)

## 2021-07-06 MED ORDER — PREGABALIN 150 MG PO CAPS: 150.0000 mg | ORAL_CAPSULE | Freq: Three times a day (TID) | ORAL | 1 refills | Status: DC

## 2021-07-06 NOTE — Telephone Encounter (Signed)
.   Requested Prescriptions    No prescriptions requested or ordered in this encounter   Provera  2.5mg  twice daily  Medication Management [place] [address]

## 2021-07-06 NOTE — Telephone Encounter (Signed)
Pregabalin 150 mg #270 1 RF (90 day) faxed to [facility] for pt assistance through [place].

## 2021-07-06 NOTE — Patient Instructions (Addendum)
Breast ultrasound ordered Try to schedule diagnostic mammogram in the next couple weeks Try mild anti-inflammatories or topical anti-inflammatories to help with pain Keep follow up in the end of October

## 2021-07-06 NOTE — Telephone Encounter (Signed)
Pt aware [facility] did not refill med in 01/2021. Will need an appt for refill.   VV scheduled. 10/11 at 745.

## 2021-07-06 NOTE — Progress Notes (Signed)
Dana Bradley is a 50 y.o. female that presents today for Breast Mass (Right breast, has not had a mammogram done in a year) .  Chronic medical conditions include: HTN, migraine.  HPI: Dana Bradley is a 50 year old female here complaining of a right breast lump. 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 Superior aspect of right breast, feels like a straw, x2 weeks. Painful constnat but worse with deep breaths. Hasnt tried anything. Not on left side. Never before.   BREAST LUMP/PAIN Duration : 2 weeks Location: right, 11-12 o'clock position Onset: gradual Severity: 3/10, worse 6/10 Quality: aching, pressure like Frequency: constant Redness: no Swelling: no Trauma: no trauma Breastfeeding: no Associated with menstral cycle: no; LMP in February  Nipple discharge: no Breast lump: yes Status: worse Treatments attempted: none Previous mammogram: 2014, has a diagnostic mammogram ordered 9/19, not scheduled yet due to daughter's cancer treatments Family Hx: maternal grandmother, multiple great aunts with history of breast cancer in early 67's  Review of Systems  Constitutional:  Negative for appetite change, chills and fever.  Gastrointestinal:  Negative for abdominal pain, constipation and diarrhea.  Skin: Negative.    PAST MEDICAL, SURGICAL, FAMILY AND SOCIAL HISTORY: Reviewed in Epic and signed.  VITALS: Today's Vitals   07/06/21 0813  BP: 122/86  Pulse: 84  Resp: 16  Temp: 98.1 F (36.7 C)  SpO2: 96%  Weight: 220 lb 4.8 oz (99.9 kg)  Height: 5\' 3"  (1.6 m)   Body mass index is 39.02 kg/m.   PHYSICAL EXAM: Physical Exam Constitutional:      Appearance: Normal appearance.  HENT:     Head: Normocephalic and atraumatic.  Eyes:     Conjunctiva/sclera: Conjunctivae normal.  Cardiovascular:     Rate and Rhythm: Normal rate and regular rhythm.     Pulses: Normal pulses.  Pulmonary:     Effort: Pulmonary effort is normal.     Breath  sounds: Normal breath sounds.  Chest:  Breasts:    Breasts are asymmetrical.     Right: Swelling and tenderness present. No bleeding, inverted nipple, nipple discharge or skin change.     Left: Normal. No swelling, bleeding, inverted nipple, mass, nipple discharge, skin change or tenderness.     Comments: Small soft area of fullness at 11-12 o'clock position on right breast; no overlaying skin changes Abdominal:     General: There is no distension.     Palpations: Abdomen is soft.     Tenderness: There is no abdominal tenderness.  Lymphadenopathy:     Upper Body:     Right upper body: No supraclavicular, axillary or pectoral adenopathy.     Left upper body: No supraclavicular, axillary or pectoral adenopathy.  Skin:    General: Skin is warm and dry.  Neurological:     General: No focal deficit present.     Mental Status: She is alert. Mental status is at baseline.  Psychiatric:        Mood and Affect: Mood normal.        Behavior: Behavior normal.    Assessment&Plan 1. Breast lump on right side at 11 o'clock position/Breast pain/Family history of breast cancer in female: small area of fullness palpated, due to family history of breast cancer and that her last mammogram was in 2014, an evaluation is warranted. A diagnostic mammogram was ordered at her last visit in September, this will be scheduled in the next few weeks as well as a dedicated  right breast ultrasound. In the meantime, recommend topical anti-inflammatories as tolerated for pain as well as warm compresses. She will continue to monitor and call the office with any changes. She will follow up for her regularly scheduled appointment on 07/30/21 for recheck.   - US BREAST LTD UNI RIGHT INC AXILLA; Future   Return for already has follow up scheduled . On 07/30/21

## 2021-07-09 ENCOUNTER — Telehealth: Payer: Medicaid Other | Admitting: Pharmacy Technician

## 2021-07-09 NOTE — Telephone Encounter (Addendum)
Auth Submission: PENDING  Payer: [employer] Medication & CPT/J Code(s) submitted: Vyepti (Eptinezumab) [phone] Route of submission (phone, fax, portal): PHONE [phone] Financial Assistance Approval date: 03/28/21 - 09/27/21.  Medical coverage = 100%  Patient may be eligible for assistance through the [facility] Patient Foundation/Assistance Program Phone: [phone]  Will f/u with response.

## 2021-07-10 ENCOUNTER — Telehealth (INDEPENDENT_AMBULATORY_CARE_PROVIDER_SITE_OTHER): Payer: Self-pay | Admitting: Obstetrics and Gynecology

## 2021-07-10 ENCOUNTER — Encounter: Payer: Self-pay | Admitting: Obstetrics and Gynecology

## 2021-07-10 NOTE — Progress Notes (Signed)
I attempted to connect with [name] via video visit today.  I camera microphone and speakers were working correctly. She was unable to make a camera or her microphone connected with caregility. I then attempted to phone her to convert the video visit into a televisit and aligned repeatedly rang busy. I will speak to the front desk about having her reschedule an appointment in person if she would still like to be seen.

## 2021-07-12 ENCOUNTER — Encounter: Payer: Self-pay | Admitting: Physical Medicine and Rehabilitation

## 2021-07-12 ENCOUNTER — Other Ambulatory Visit: Payer: Self-pay

## 2021-07-18 ENCOUNTER — Other Ambulatory Visit: Payer: Self-pay

## 2021-07-19 ENCOUNTER — Encounter: Payer: Self-pay | Admitting: Orthopedic Surgery

## 2021-07-19 ENCOUNTER — Other Ambulatory Visit: Payer: Self-pay

## 2021-07-19 ENCOUNTER — Ambulatory Visit (INDEPENDENT_AMBULATORY_CARE_PROVIDER_SITE_OTHER): Payer: Self-pay | Admitting: Orthopedic Surgery

## 2021-07-19 DIAGNOSIS — M25522 Pain in left elbow: Secondary | ICD-10-CM

## 2021-07-19 DIAGNOSIS — M7712 Lateral epicondylitis, left elbow: Secondary | ICD-10-CM | POA: Insufficient documentation

## 2021-07-19 MED ORDER — LIDOCAINE HCL 1 % IJ SOLN
1.0000 mL | INTRAMUSCULAR | Status: AC | PRN
  Administered 2021-07-19: 1 mL

## 2021-07-19 MED ORDER — BETAMETHASONE SOD PHOS & ACET 6 (3-3) MG/ML IJ SUSP
6.0000 mg | INTRAMUSCULAR | Status: AC | PRN
  Administered 2021-07-19: 6 mg via INTRA_ARTICULAR

## 2021-07-19 NOTE — Progress Notes (Signed)
Office Visit Note   Patient: Dana Bradley           Date of Birth: [birth year]           MRN: [id number] Visit Date: 07/19/2021              Requested by: Delsa Grana, PA-C [address] [place],  [place] [address] PCP: Delsa Grana, PA-C   Assessment & Plan: Visit Diagnoses:  1. Lateral epicondylitis, left elbow     Plan: We discussed the diagnosis, prognosis, non-operative and operative treatment options for lateral epicondylitls.  After our discussion, the patient would like to proceed with corticosteroid injection.  She has been doing home exercises and applying topical Voltaren.  She is unable to take oral NSAIDs.  We reviewed the risks and benefits of conservative management.  The patient expressed understanding of the reasoning and strategy going forward.  We discussed that the injection will hopefully help with her symptoms but if not then I would recommend another course of formal physical therapy as this seem to have helped her last time.   All patient questions and concerns were addressed.    Follow-Up Instructions: No follow-ups on file.   Orders:  No orders of the defined types were placed in this encounter.  No orders of the defined types were placed in this encounter.     Procedures: Hand/UE Inj: L elbow for lateral epicondylitis on 07/19/2021 9:17 AM Indications: pain Details: 25 G needle, lateral approach Medications: 1 mL lidocaine 1 %; 6 mg betamethasone acetate-betamethasone sodium phosphate 6 (3-3) MG/ML Outcome: tolerated well, no immediate complications Procedure, treatment alternatives, risks and benefits explained, specific risks discussed. Consent was given by the patient.      Clinical Data: No additional findings.   Subjective: Chief Complaint  Patient presents with   Left Elbow - Pain    This is a 50 year old right-hand-dominant female who presents with left lateral elbow pain.  She had a similar episode back in 2020 and was  diagnosed with lateral epicondylitis.  This resolved after a period of formal physical therapy and rest.  She has had return of her symptoms starting approximately July.  Her symptoms have improved since July but are still bothering her.  She describes pain right over the lateral epicondyle radiating distally along the extensors into the proximal forearm.  This is worse with certain activities such as lifting heavy objects.  Her pain is improving since it for started but has not completely gone away.  She denies pain on the medial side.  She denies any actual trauma or injury to her elbow.   Review of Systems   Objective: Vital Signs: There were no vitals taken for this visit.  Physical Exam Constitutional:      Appearance: Normal appearance.  Cardiovascular:     Rate and Rhythm: Normal rate.     Pulses: Normal pulses.  Pulmonary:     Effort: Pulmonary effort is normal.  Skin:    General: Skin is warm and dry.     Capillary Refill: Capillary refill takes less than 2 seconds.  Neurological:     Mental Status: She is alert.    Left Elbow Exam   Tenderness  Left elbow tenderness location: TTP at lateral epicondyle radiating into her forearm.   Range of Motion  The patient has normal left elbow ROM.  Muscle Strength  The patient has normal left elbow strength.  Other  Sensation: normal Pulse: present  Comments:  Pain w/ resisted wrist and middle finger extension with elbow extended     Specialty Comments:  No specialty comments available.  Imaging: No results found.   PMFS History: Patient Active Problem List   Diagnosis Date Noted   Lateral epicondylitis, left elbow 07/19/2021   Abnormal MRI, cervical spine (02/14/2021) 02/28/2021   Cervicalgia 02/13/2021   Abnormal MRI, lumbar spine (03/16/2020) 11/09/2020   Chronic midline thoracic back pain 11/09/2020   Abnormal MRI, thoracic spine (08/02/2019) 11/09/2020   Thoracic spinal stenosis (T7-8) 11/09/2020    Prolapse of thoracic disc with radiculopathy (T7-8) 11/09/2020   Spasm of muscle of lower back 11/09/2020   Vertigo, benign paroxysmal, unspecified laterality 25/63/8937   Uncomplicated opioid dependence ([place]) 11/08/2020   Hyperalgesia 03/07/2020   Neurogenic urinary incontinence 03/01/2020   Other intervertebral disc degeneration, lumbar region 03/01/2020   Spinal stenosis of lumbosacral region 03/01/2020   Pharmacologic therapy 02/06/2020   Lumbar radiculitis (Right) 09/21/2019   Chronic lower extremity pain (Bilateral) 09/06/2019   Intractable migraine with aura without status migrainosus 08/10/2019   Other specified dorsopathies, sacral and sacrococcygeal region 08/03/2019   Latex precautions, history of latex allergy 08/03/2019   History of allergy to radiographic contrast media 08/03/2019   DDD (degenerative disc disease), cervical 07/21/2019   Cervical facet syndrome (Bilateral) (L>R) 07/21/2019   DDD (degenerative disc disease), thoracic 07/21/2019   Osteoarthritis of hip (Left) 07/21/2019   Chronic groin pain (Bilateral) (L>R) 07/21/2019   Chronic hip pain (Bilateral) (L>R) 07/21/2019   Somatic dysfunction of sacroiliac joint (Bilateral) 07/21/2019   Migraine with aura and with status migrainosus, not intractable 04/06/2019   Cervico-occipital neuralgia (Left) 04/06/2019   Weakness of leg (Left) 04/05/2019   Difficulty walking 04/05/2019   Chronic migraine without aura, with intractable migraine, so stated, with status migrainosus 12/27/2018   Malar rash 09/04/2018   Trigger point of neck (Left) 03/19/2018   Occipital headache 12/25/2017   Chronic fatigue syndrome with fibromyalgia 12/12/2017   Trigger point of shoulder region (Left) 11/17/2017   Myofascial pain syndrome (Left) (trapezius muscle) 07/22/2017   Lumbar L1-2 disc protrusion (Right) 04/07/2017   Muscle spasticity 04/01/2017   Osteoarthritis of shoulder (Bilateral) 04/01/2017   Lumbar spondylosis 01/06/2017    Chronic hip pain (Left) 12/24/2016   Chronic sacroiliac joint pain (Left) 12/24/2016   Lumbar facet syndrome (Bilateral) (L>R) 12/24/2016   Lumbar radiculitis (Left) 12/24/2016   Hypertriglyceridemia 11/27/2016   History of vasovagal episode 10/30/2016   Cervicogenic headache 09/09/2016   Medication monitoring encounter 08/29/2016   Controlled substance agreement signed 08/28/2016   Plantar fasciitis of left foot 08/28/2016   Vitamin B12 deficiency 08/28/2016   Hyperlipidemia 08/28/2016   Nephrolithiasis 08/12/2016   Chronic pain syndrome 08/07/2016   Long term prescription opiate use 08/07/2016   Opiate use 08/07/2016   Long term prescription benzodiazepine use 08/07/2016   Neurogenic pain 08/07/2016   Chronic low back pain (1ry area of Pain) (Bilateral) (R>L) (midline) 08/07/2016   Chronic upper back pain (2ry area of Pain) (Bilateral) (L>R) 08/07/2016   Chronic abdominal pain (Right lower quadrant) 08/07/2016   Thoracic radiculitis (Bilateral: T10, T11) 08/07/2016   Chronic occipital neuralgia (3ry area of Pain) (Bilateral) (L>R) 08/07/2016   Chronic neck pain 08/07/2016   Chronic cervical radicular pain (Bilateral) (L>R) 08/07/2016   Chronic shoulder blade pain (Bilateral) (L>R) 08/07/2016   Chronic upper extremity pain (Bilateral) (R>L) 08/07/2016   Chronic knee pain (Bilateral) (R>L) 08/07/2016   Chronic ankle pain (Bilateral) 08/07/2016   Cervical  spondylosis with myelopathy and radiculopathy 08/07/2016   Panic disorder with agoraphobia 05/29/2016   Depression, unspecified depression type 05/29/2016   Atypical lymphocytosis 05/01/2016   Vitamin D insufficiency 05/01/2016   Chronic lower extremity cramps (Bilateral) (R>L) 04/29/2016   Obesity 04/29/2016   GAD (generalized anxiety disorder) 04/29/2016   Fatigue 04/29/2016   Insomnia 07/12/2015   Migraine without aura and with status migrainosus, not intractable 07/12/2015   Chronic superficial gastritis 06/02/2015    Chronic pain of multiple joints 05/15/2015   Bilateral leg edema 05/02/2015   Paroxysmal supraventricular tachycardia (HCC) 04/17/2015   Exertional shortness of breath 04/17/2015   Bright red rectal bleeding 04/06/2015   DDD (degenerative disc disease), lumbosacral 01/24/2014   DDD (degenerative disc disease), lumbar 01/24/2014   Cervico-occipital neuralgia 12/29/2013   Fibromyalgia 12/29/2013   Migraine headache 12/29/2013   Menorrhagia 12/10/2012   Depression, major, recurrent, in remission ([place]) 01/12/2009   Chest pain 01/12/2009   Hypertension, benign essential, goal below 140/90 06/23/2008   History of PSVT (paroxysmal supraventricular tachycardia) 06/17/2008   Obstructive sleep apnea 06/17/2008   GERD 06/13/2008   Past Medical History:  Diagnosis Date   Acute postoperative pain 04/07/2017   Anxiety    Bursitis    Chronic fatigue 12/12/2017   Chronic fatigue syndrome    Colitis 2021   Edema leg 05/02/2015   Fibromyalgia    GERD (gastroesophageal reflux disease)    IBS (irritable bowel syndrome)    Knee pain, bilateral 12/21/2008   Qualifier: Diagnosis of  By: Hassell Done FNP, [place]     Lumbar discitis    Migraines    Osteoarthritis    Right hand pain 04/10/2015   [facility] has done nerve conduction studies and ruled out carpal tunnel.    Sleep apnea    Spinal stenosis    SVT (supraventricular tachycardia) ([place])    Vertigo    Vitamin D deficiency 05/01/2016    Family History  Problem Relation Age of Onset   Depression Mother    Hypertension Mother    Cancer Mother        Skin   Hyperlipidemia Mother    Anxiety disorder Mother    Migraines Mother    Alcohol abuse Father    Depression Father    Stroke Father    Heart disease Father    Hypertension Father    Anxiety disorder Father    Depression Sister    Hyperlipidemia Sister    Diabetes Sister    Hypertension Sister    Polycystic ovary syndrome Sister    Bipolar disorder Sister    Anxiety disorder  Sister    Migraines Sister    Cancer Maternal Grandmother 30       Breast   Thyroid disease Maternal Grandmother    Arthritis Maternal Grandmother    Hyperlipidemia Maternal Grandmother    Depression Sister    Hypertension Sister    Anxiety disorder Sister    Migraines Sister    Alzheimer's disease Other    Aneurysm Maternal Grandfather    Hypertension Maternal Grandfather    Heart disease Maternal Grandfather    Alzheimer's disease Paternal Grandmother    Heart attack Paternal Grandfather    Hypertension Paternal Grandfather    COPD Paternal Grandfather    Heart disease Paternal Grandfather    Migraines Son    Migraines Daughter    Migraines Daughter    Bladder Cancer Neg Hx    Kidney cancer Neg Hx     Past Surgical  History:  Procedure Laterality Date   ABLATION     Uterine   CARDIAC CATHETERIZATION     with ablation   COLONOSCOPY WITH PROPOFOL N/A 05/17/2015   Procedure: COLONOSCOPY WITH PROPOFOL;  Surgeon: Manya Silvas, MD;  Location: [facility] ENDOSCOPY;  Service: Endoscopy;  Laterality: N/A;   COLONOSCOPY WITH PROPOFOL N/A 03/20/2020   Procedure: COLONOSCOPY WITH PROPOFOL;  Surgeon: Jonathon Bellows, MD;  Location: [facility] ENDOSCOPY;  Service: Gastroenterology;  Laterality: N/A;   ESOPHAGOGASTRODUODENOSCOPY N/A 05/17/2015   Procedure: ESOPHAGOGASTRODUODENOSCOPY (EGD);  Surgeon: Manya Silvas, MD;  Location[facility] ENDOSCOPY;  Service: Endoscopy;  Laterality: N/A;   KNEE ARTHROSCOPY     spg     6/18   Tibial Tubercle Bypass Right 1998   TUBAL LIGATION  10/01/99   Social History   Occupational History   Occupation: disbled    Comment: not able  Tobacco Use   Smoking status: Former    Packs/day: 4.00    Years: 3.00    Pack years: 12.00    Types: Cigarettes    Quit date: 12/10/1992    Years since quitting: 28.6   Smokeless tobacco: Never   Tobacco comments:    quit 25 years ago  Vaping Use   Vaping Use: Never used  Substance and Sexual Activity   Alcohol use: No     Alcohol/week: 0.0 standard drinks   Drug use: No   Sexual activity: Not Currently    Partners: Male    Birth control/protection: Surgical

## 2021-07-20 ENCOUNTER — Other Ambulatory Visit: Payer: Self-pay | Admitting: Physical Medicine and Rehabilitation

## 2021-07-20 MED ORDER — CARBAMAZEPINE ER 100 MG PO TB12
100.0000 mg | ORAL_TABLET | Freq: Every day | ORAL | 3 refills | Status: DC
  Filled 2021-07-20: qty 30, 30d supply, fill #0
  Filled 2021-08-25: qty 30, 30d supply, fill #1

## 2021-07-23 ENCOUNTER — Other Ambulatory Visit: Payer: Self-pay

## 2021-07-23 ENCOUNTER — Encounter: Payer: Self-pay | Admitting: Physical Medicine and Rehabilitation

## 2021-07-23 NOTE — Telephone Encounter (Signed)
FYI NOTES: VYEPTI  LUNDBECK MIGRAINE PAP ([phone])  Patient states she will complete PATIENT INFORMATION and PATIENT INSURANCE section of the application via on-line. Patient understands she will need to provide financial documents and signature/consent.  CLINICAL and PHYSICIAN INFORMATION section of the application has been completed and faxed [phone])  Will f/u with response.

## 2021-07-24 ENCOUNTER — Other Ambulatory Visit: Payer: Self-pay

## 2021-07-25 ENCOUNTER — Other Ambulatory Visit: Payer: Self-pay

## 2021-07-25 ENCOUNTER — Encounter: Payer: Self-pay | Admitting: Physical Medicine and Rehabilitation

## 2021-07-25 ENCOUNTER — Ambulatory Visit (INDEPENDENT_AMBULATORY_CARE_PROVIDER_SITE_OTHER): Payer: Self-pay | Admitting: Obstetrics and Gynecology

## 2021-07-25 ENCOUNTER — Encounter: Payer: Self-pay | Admitting: Obstetrics and Gynecology

## 2021-07-25 VITALS — BP 124/86 | HR 98 | Ht 63.0 in | Wt 220.3 lb

## 2021-07-25 DIAGNOSIS — G8929 Other chronic pain: Secondary | ICD-10-CM

## 2021-07-25 DIAGNOSIS — R102 Pelvic and perineal pain: Secondary | ICD-10-CM

## 2021-07-25 DIAGNOSIS — Z9889 Other specified postprocedural states: Secondary | ICD-10-CM

## 2021-07-25 MED ORDER — MEDROXYPROGESTERONE ACETATE 2.5 MG PO TABS
2.5000 mg | ORAL_TABLET | Freq: Every day | ORAL | 1 refills | Status: DC
  Filled 2021-07-25: qty 30, 30d supply, fill #0
  Filled 2021-08-25: qty 30, 30d supply, fill #1

## 2021-07-25 NOTE — Progress Notes (Signed)
Pt present for medication review. Pt stated that she currently taking provera 5mg . Pharmacy only has 2.5mg  pt will need rx for 2.5mg  2 tablets daily.

## 2021-07-25 NOTE — Progress Notes (Signed)
HPI:      [name] is a 50 y.o. [phone] who LMP was No LMP recorded. (Menstrual status: Oral contraceptives).  Subjective:   She presents today to discuss her pelvic pain and use of progesterone.  She has been happy on the progesterone and has alleviated her pelvic pain. (Patient has a history of tubal ligation and has had an endometrial ablation) she was having pelvic pain which was associated with some vaginal bleeding. She has been taking 5 mg of progesterone daily and this has alleviated her pain and bleeding.    Hx: The following portions of the patient's history were reviewed and updated as appropriate:             She  has a past medical history of Acute postoperative pain (04/07/2017), Anxiety, Bursitis, Chronic fatigue (12/12/2017), Chronic fatigue syndrome, Colitis (2021), Edema leg (05/02/2015), Fibromyalgia, GERD (gastroesophageal reflux disease), IBS (irritable bowel syndrome), Knee pain, bilateral (12/21/2008), Lumbar discitis, Migraines, Osteoarthritis, Right hand pain (04/10/2015), Sleep apnea, Spinal stenosis, SVT (supraventricular tachycardia) ([place]), Vertigo, and Vitamin D deficiency (05/01/2016). She does not have any pertinent problems on file. She  has a past surgical history that includes Ablation; Tubal ligation (10/01/99); Cardiac catheterization; Knee arthroscopy; Colonoscopy with propofol (N/A, 05/17/2015); Esophagogastroduodenoscopy (N/A, 05/17/2015); spg; Tibial Tubercle Bypass (Right, 1998); and Colonoscopy with propofol (N/A, 03/20/2020). Her family history includes Alcohol abuse in her father; Alzheimer's disease in her paternal grandmother and another family member; Aneurysm in her maternal grandfather; Anxiety disorder in her father, mother, sister, and sister; Arthritis in her maternal grandmother; Bipolar disorder in her sister; COPD in her paternal grandfather; Cancer in her mother; Cancer (age of onset: 16) in her maternal grandmother; Depression in her father, mother,  sister, and sister; Diabetes in her sister; Heart attack in her paternal grandfather; Heart disease in her father, maternal grandfather, and paternal grandfather; Hyperlipidemia in her maternal grandmother, mother, and sister; Hypertension in her father, maternal grandfather, mother, paternal grandfather, sister, and sister; Migraines in her daughter, daughter, mother, sister, sister, and son; Polycystic ovary syndrome in her sister; Stroke in her father; Thyroid disease in her maternal grandmother. She  reports that she quit smoking about 28 years ago. Her smoking use included cigarettes. She has a 12.00 pack-year smoking history. She has never used smokeless tobacco. She reports that she does not drink alcohol and does not use drugs. She has a current medication list which includes the following prescription(s): amitriptyline, atorvastatin, baclofen, butalbital-acetaminophen-caffeine, carbamazepine, vitamin b-12, dicyclomine, creon, medroxyprogesterone, metoprolol tartrate, migranal, needles & syringes, omeprazole, ondansetron, pregabalin, nurtec, sodium chloride 0.9 % SOLN 100 mL with Eptinezumab-jjmr 100 MG/ML SOLN 100 mg, valerian, and diclofenac sodium. She is allergic to aspirin, cymbalta [duloxetine hcl], depakote [divalproex sodium], gadolinium derivatives, haloperidol, meperidine, reglan [metoclopramide], tramadol hcl, trazodone, compazine [prochlorperazine], meloxicam, penicillins, tomato, other, shellfish allergy, shellfish-derived products, bacitracin-neomycin-polymyxin, cephalosporins, ibuprofen, latex, neosporin [neomycin-bacitracin zn-polymyx], nsaids, sulfa antibiotics, and sulfonamide derivatives.       Review of Systems:  Review of Systems  Constitutional: Denied constitutional symptoms, night sweats, recent illness, fatigue, fever, insomnia and weight loss.  Eyes: Denied eye symptoms, eye pain, photophobia, vision change and visual disturbance.  Ears/Nose/Throat/Neck: Denied ear, nose,  throat or neck symptoms, hearing loss, nasal discharge, sinus congestion and sore throat.  Cardiovascular: Denied cardiovascular symptoms, arrhythmia, chest pain/pressure, edema, exercise intolerance, orthopnea and palpitations.  Respiratory: Denied pulmonary symptoms, asthma, pleuritic pain, productive sputum, cough, dyspnea and wheezing.  Gastrointestinal: Denied, gastro-esophageal reflux, melena, nausea and vomiting.  Genitourinary:  See HPI for additional information.  Musculoskeletal: Denied musculoskeletal symptoms, stiffness, swelling, muscle weakness and myalgia.  Dermatologic: Denied dermatology symptoms, rash and scar.  Neurologic: Denied neurology symptoms, dizziness, headache, neck pain and syncope.  Psychiatric: Denied psychiatric symptoms, anxiety and depression.  Endocrine: Denied endocrine symptoms including hot flashes and night sweats.   Meds:   Current Outpatient Medications on File Prior to Visit  Medication Sig Dispense Refill   amitriptyline (ELAVIL) 50 MG tablet Take 1 tablet (50 mg total) by mouth at bedtime. 30 tablet 3   atorvastatin (LIPITOR) 40 MG tablet TAKE 1 TABLET (40 MG TOTAL) BY MOUTH AT BEDTIME. 90 tablet 3   baclofen (LIORESAL) 10 MG tablet TAKE ONE TABLET BY MOUTH 3 TIMES A DAY 90 tablet 2   butalbital-acetaminophen-caffeine (FIORICET) 50-325-40 MG tablet Take 1 tablet by mouth every 8 (eight) hours as needed for headache. 40 tablet 0   carbamazepine (TEGRETOL-XR) 100 MG 12 hr tablet Take 1 tablet (100 mg total) by mouth once daily at bedtime. 30 tablet 3   Cyanocobalamin (VITAMIN B-12) 500 MCG SUBL Place 1,500 mcg under the tongue daily.  150 tablet    dicyclomine (BENTYL) 10 MG capsule Take 1 capsule (10 mg total) by mouth 4 (four) times daily as needed (before meals and at bedtime) 360 capsule 1   lipase/protease/amylase (CREON) 12000-38000 units CPEP capsule Take 2 capsules by mouth with the first bite of each meal and 1 capsule before snacks. (Patient  taking differently: Take 2 capsules by mouth with the first bite of each meal and 1 capsule before snacks.) 240 capsule 1   metoprolol tartrate (LOPRESSOR) 50 MG tablet Take 1 tablet (50 mg total) by mouth 2 (two) times daily. 180 tablet 0   MIGRANAL 4 MG/ML nasal spray Spray 1 spray into one nostril as needed for migraine as directed. No more than 4 sprays in one hour. (Patient taking differently: Place 1 spray into the nose as needed.) 8 mL 3   Needles & Syringes MISC For administration of B12 injections 30 each 0   omeprazole (PRILOSEC) 40 MG capsule Take 1 capsule (40 mg total) by mouth daily. 90 capsule 3   ondansetron (ZOFRAN) 4 MG tablet Take 1 tablet (4 mg total) by mouth every 8 (eight) hours as needed. 20 tablet 2   pregabalin (LYRICA) 150 MG capsule Take 1 capsule (150 mg total) by mouth every 8 (eight) hours. 270 capsule 1   Rimegepant Sulfate (NURTEC) 75 MG TBDP Take 1 tablet by mouth once every other day. 30 tablet 3   sodium chloride 0.9 % SOLN 100 mL with Eptinezumab-jjmr 100 MG/ML SOLN 100 mg Inject 100 mg into the vein every 3 (three) months. 1 each 3   VALERIAN PO Take by mouth as needed. Makes Valerian tea about 3-4 times per week.     diclofenac Sodium (VOLTAREN) 1 % GEL Apply 2 g topically 4 (four) times daily as needed. 350 g PRN   No current facility-administered medications on file prior to visit.      Objective:     Vitals:   07/25/21 1510  BP: 124/86  Pulse: 98   Filed Weights   07/25/21 1510  Weight: 220 lb 4.8 oz (99.9 kg)                        Assessment:    [id number] Patient Active Problem List   Diagnosis Date Noted   Lateral epicondylitis, left elbow 07/19/2021  Abnormal MRI, cervical spine (02/14/2021) 02/28/2021   Cervicalgia 02/13/2021   Abnormal MRI, lumbar spine (03/16/2020) 11/09/2020   Chronic midline thoracic back pain 11/09/2020   Abnormal MRI, thoracic spine (08/02/2019) 11/09/2020   Thoracic spinal stenosis (T7-8) 11/09/2020    Prolapse of thoracic disc with radiculopathy (T7-8) 11/09/2020   Spasm of muscle of lower back 11/09/2020   Vertigo, benign paroxysmal, unspecified laterality 81/09/7508   Uncomplicated opioid dependence ([place]) 11/08/2020   Hyperalgesia 03/07/2020   Neurogenic urinary incontinence 03/01/2020   Other intervertebral disc degeneration, lumbar region 03/01/2020   Spinal stenosis of lumbosacral region 03/01/2020   Pharmacologic therapy 02/06/2020   Lumbar radiculitis (Right) 09/21/2019   Chronic lower extremity pain (Bilateral) 09/06/2019   Intractable migraine with aura without status migrainosus 08/10/2019   Other specified dorsopathies, sacral and sacrococcygeal region 08/03/2019   Latex precautions, history of latex allergy 08/03/2019   History of allergy to radiographic contrast media 08/03/2019   DDD (degenerative disc disease), cervical 07/21/2019   Cervical facet syndrome (Bilateral) (L>R) 07/21/2019   DDD (degenerative disc disease), thoracic 07/21/2019   Osteoarthritis of hip (Left) 07/21/2019   Chronic groin pain (Bilateral) (L>R) 07/21/2019   Chronic hip pain (Bilateral) (L>R) 07/21/2019   Somatic dysfunction of sacroiliac joint (Bilateral) 07/21/2019   Migraine with aura and with status migrainosus, not intractable 04/06/2019   Cervico-occipital neuralgia (Left) 04/06/2019   Weakness of leg (Left) 04/05/2019   Difficulty walking 04/05/2019   Chronic migraine without aura, with intractable migraine, so stated, with status migrainosus 12/27/2018   Malar rash 09/04/2018   Trigger point of neck (Left) 03/19/2018   Occipital headache 12/25/2017   Chronic fatigue syndrome with fibromyalgia 12/12/2017   Trigger point of shoulder region (Left) 11/17/2017   Myofascial pain syndrome (Left) (trapezius muscle) 07/22/2017   Lumbar L1-2 disc protrusion (Right) 04/07/2017   Muscle spasticity 04/01/2017   Osteoarthritis of shoulder (Bilateral) 04/01/2017   Lumbar spondylosis 01/06/2017    Chronic hip pain (Left) 12/24/2016   Chronic sacroiliac joint pain (Left) 12/24/2016   Lumbar facet syndrome (Bilateral) (L>R) 12/24/2016   Lumbar radiculitis (Left) 12/24/2016   Hypertriglyceridemia 11/27/2016   History of vasovagal episode 10/30/2016   Cervicogenic headache 09/09/2016   Medication monitoring encounter 08/29/2016   Controlled substance agreement signed 08/28/2016   Plantar fasciitis of left foot 08/28/2016   Vitamin B12 deficiency 08/28/2016   Hyperlipidemia 08/28/2016   Nephrolithiasis 08/12/2016   Chronic pain syndrome 08/07/2016   Long term prescription opiate use 08/07/2016   Opiate use 08/07/2016   Long term prescription benzodiazepine use 08/07/2016   Neurogenic pain 08/07/2016   Chronic low back pain (1ry area of Pain) (Bilateral) (R>L) (midline) 08/07/2016   Chronic upper back pain (2ry area of Pain) (Bilateral) (L>R) 08/07/2016   Chronic abdominal pain (Right lower quadrant) 08/07/2016   Thoracic radiculitis (Bilateral: T10, T11) 08/07/2016   Chronic occipital neuralgia (3ry area of Pain) (Bilateral) (L>R) 08/07/2016   Chronic neck pain 08/07/2016   Chronic cervical radicular pain (Bilateral) (L>R) 08/07/2016   Chronic shoulder blade pain (Bilateral) (L>R) 08/07/2016   Chronic upper extremity pain (Bilateral) (R>L) 08/07/2016   Chronic knee pain (Bilateral) (R>L) 08/07/2016   Chronic ankle pain (Bilateral) 08/07/2016   Cervical spondylosis with myelopathy and radiculopathy 08/07/2016   Panic disorder with agoraphobia 05/29/2016   Depression, unspecified depression type 05/29/2016   Atypical lymphocytosis 05/01/2016   Vitamin D insufficiency 05/01/2016   Chronic lower extremity cramps (Bilateral) (R>L) 04/29/2016   Obesity 04/29/2016   GAD (generalized anxiety  disorder) 04/29/2016   Fatigue 04/29/2016   Insomnia 07/12/2015   Migraine without aura and with status migrainosus, not intractable 07/12/2015   Chronic superficial gastritis 06/02/2015    Chronic pain of multiple joints 05/15/2015   Bilateral leg edema 05/02/2015   Paroxysmal supraventricular tachycardia ([facility]) 04/17/2015   Exertional shortness of breath 04/17/2015   Bright red rectal bleeding 04/06/2015   DDD (degenerative disc disease), lumbosacral 01/24/2014   DDD (degenerative disc disease), lumbar 01/24/2014   Cervico-occipital neuralgia 12/29/2013   Fibromyalgia 12/29/2013   Migraine headache 12/29/2013   Menorrhagia 12/10/2012   Depression, major, recurrent, in remission ([place]) 01/12/2009   Chest pain 01/12/2009   Hypertension, benign essential, goal below 140/90 06/23/2008   History of PSVT (paroxysmal supraventricular tachycardia) 06/17/2008   Obstructive sleep apnea 06/17/2008   GERD 06/13/2008     1. Chronic pelvic pain in female   2. History of endometrial ablation     Patient doing very well now on progesterone.  It has alleviated her pain and bleeding.   Plan:            1.  Discussed use of progesterone.  I believe that 2.5 mg daily may be enough and that we can cut her dose in half.  She has agreed to try this.  If she begins having pain and bleeding again will change her dose back to 5 mg daily.  She will inform us if this occurs.  Orders No orders of the defined types were placed in this encounter.    Meds ordered this encounter  Medications   medroxyPROGESTERone (PROVERA) 2.5 MG tablet    Sig: Take 1 tablet (2.5 mg total) by mouth daily.    Dispense:  90 tablet    Refill:  1      F/U  Return in about 5 months (around 12/23/2021) for Annual Physical. I spent 22 minutes involved in the care of this patient preparing to see the patient by obtaining and reviewing her medical history (including labs, imaging tests and prior procedures), documenting clinical information in the electronic health record (EHR), counseling and coordinating care plans, writing and sending prescriptions, ordering tests or procedures and in direct communicating with the  patient and medical staff discussing pertinent items from her history and physical exam.  Finis Bud, M.D. 07/25/2021 3:42 PM

## 2021-07-26 ENCOUNTER — Other Ambulatory Visit: Payer: Self-pay

## 2021-07-30 ENCOUNTER — Encounter: Payer: Self-pay | Admitting: Family Medicine

## 2021-07-30 ENCOUNTER — Other Ambulatory Visit: Payer: Self-pay

## 2021-07-30 ENCOUNTER — Ambulatory Visit (INDEPENDENT_AMBULATORY_CARE_PROVIDER_SITE_OTHER): Payer: Self-pay | Admitting: Family Medicine

## 2021-07-30 VITALS — BP 128/78 | HR 110 | Temp 99.1°F | Resp 18 | Ht 63.0 in | Wt 221.3 lb

## 2021-07-30 DIAGNOSIS — F430 Acute stress reaction: Secondary | ICD-10-CM

## 2021-07-30 DIAGNOSIS — R000 Tachycardia, unspecified: Secondary | ICD-10-CM

## 2021-07-30 DIAGNOSIS — Z23 Encounter for immunization: Secondary | ICD-10-CM

## 2021-07-30 DIAGNOSIS — E782 Mixed hyperlipidemia: Secondary | ICD-10-CM

## 2021-07-30 DIAGNOSIS — Z5181 Encounter for therapeutic drug level monitoring: Secondary | ICD-10-CM

## 2021-07-30 DIAGNOSIS — I10 Essential (primary) hypertension: Secondary | ICD-10-CM

## 2021-07-30 MED ORDER — METOPROLOL TARTRATE 50 MG PO TABS: 75.0000 mg | ORAL_TABLET | Freq: Two times a day (BID) | ORAL | 3 refills | Status: DC

## 2021-07-30 MED ORDER — LORAZEPAM 1 MG PO TABS
0.5000 mg | ORAL_TABLET | Freq: Two times a day (BID) | ORAL | 0 refills | Status: DC | PRN
Start: 2021-07-30 — End: 2021-08-02

## 2021-07-30 NOTE — Progress Notes (Signed)
Name: Dana Bradley   MRN: [id number]    DOB: Jul 31, 1971   Date:07/30/2021       Progress Note  Chief Complaint  Patient presents with   Medication Refill   Hypertension   Hyperlipidemia     Subjective:   Dana Bradley is a 50 y.o. female, presents to clinic for   Hypertension:  Currently managed on metoprolol  Pt reports good med compliance and denies any SE.   Blood pressure today is well controlled. BP Readings from Last 3 Encounters:  07/30/21 128/78  07/25/21 124/86  07/06/21 122/86  HR high Pulse Readings from Last 3 Encounters:  07/30/21 (!) 110  07/25/21 98  07/06/21 84   SVT previously saw Dr. Rockey Situ  Pt denies CP, SOB, exertional sx, LE edema, Ha's, visual disturbances, lightheadedness, hypotension, syncope.  She feels palpitations and sometimes CP when very fast    Elbow pain - [place] - failed pt would like to see ortho       Current Outpatient Medications:    amitriptyline (ELAVIL) 50 MG tablet, Take 1 tablet (50 mg total) by mouth at bedtime., Disp: 30 tablet, Rfl: 3   atorvastatin (LIPITOR) 40 MG tablet, TAKE 1 TABLET (40 MG TOTAL) BY MOUTH AT BEDTIME., Disp: 90 tablet, Rfl: 3   butalbital-acetaminophen-caffeine (FIORICET) 50-325-40 MG tablet, Take 1 tablet by mouth every 8 (eight) hours as needed for headache., Disp: 40 tablet, Rfl: 0   carbamazepine (TEGRETOL-XR) 100 MG 12 hr tablet, Take 1 tablet (100 mg total) by mouth once daily at bedtime., Disp: 30 tablet, Rfl: 3   lipase/protease/amylase (CREON) 12000-38000 units CPEP capsule, Take 2 capsules by mouth with the first bite of each meal and 1 capsule before snacks. (Patient taking differently: Take 2 capsules by mouth with the first bite of each meal and 1 capsule before snacks.), Disp: 240 capsule, Rfl: 1   medroxyPROGESTERone (PROVERA) 2.5 MG tablet, Take 1 tablet (2.5 mg total) by mouth once daily., Disp: 90 tablet, Rfl: 1   metoprolol tartrate (LOPRESSOR) 50 MG tablet, Take 1  tablet (50 mg total) by mouth 2 (two) times daily., Disp: 180 tablet, Rfl: 0   MIGRANAL 4 MG/ML nasal spray, Spray 1 spray into one nostril as needed for migraine as directed. No more than 4 sprays in one hour. (Patient taking differently: Place 1 spray into the nose as needed.), Disp: 8 mL, Rfl: 3   Needles & Syringes MISC, For administration of B12 injections, Disp: 30 each, Rfl: 0   omeprazole (PRILOSEC) 40 MG capsule, Take 1 capsule (40 mg total) by mouth daily., Disp: 90 capsule, Rfl: 3   ondansetron (ZOFRAN) 4 MG tablet, Take 1 tablet (4 mg total) by mouth every 8 (eight) hours as needed., Disp: 20 tablet, Rfl: 2   pregabalin (LYRICA) 150 MG capsule, Take 1 capsule (150 mg total) by mouth every 8 (eight) hours., Disp: 270 capsule, Rfl: 1   Rimegepant Sulfate (NURTEC) 75 MG TBDP, Take 1 tablet by mouth once every other day., Disp: 30 tablet, Rfl: 3   VALERIAN PO, Take by mouth as needed. Makes Valerian tea about 3-4 times per week., Disp: , Rfl:    baclofen (LIORESAL) 10 MG tablet, TAKE ONE TABLET BY MOUTH 3 TIMES A DAY, Disp: 90 tablet, Rfl: 2   Cyanocobalamin (VITAMIN B-12) 500 MCG SUBL, Place 1,500 mcg under the tongue daily.  (Patient not taking: Reported on 07/30/2021), Disp: 150 tablet, Rfl:    diclofenac Sodium (VOLTAREN) 1 %  GEL, Apply 2 g topically 4 (four) times daily as needed., Disp: 350 g, Rfl: PRN   dicyclomine (BENTYL) 10 MG capsule, Take 1 capsule (10 mg total) by mouth 4 (four) times daily as needed (before meals and at bedtime), Disp: 360 capsule, Rfl: 1   sodium chloride 0.9 % SOLN 100 mL with Eptinezumab-jjmr 100 MG/ML SOLN 100 mg, Inject 100 mg into the vein every 3 (three) months. (Patient not taking: Reported on 07/30/2021), Disp: 1 each, Rfl: 3  Patient Active Problem List   Diagnosis Date Noted   Lateral epicondylitis, left elbow 07/19/2021   Abnormal MRI, cervical spine (02/14/2021) 02/28/2021   Cervicalgia 02/13/2021   Abnormal MRI, lumbar spine (03/16/2020)  11/09/2020   Chronic midline thoracic back pain 11/09/2020   Abnormal MRI, thoracic spine (08/02/2019) 11/09/2020   Thoracic spinal stenosis (T7-8) 11/09/2020   Prolapse of thoracic disc with radiculopathy (T7-8) 11/09/2020   Spasm of muscle of lower back 11/09/2020   Vertigo, benign paroxysmal, unspecified laterality 36/64/4034   Uncomplicated opioid dependence ([place]) 11/08/2020   Hyperalgesia 03/07/2020   Neurogenic urinary incontinence 03/01/2020   Other intervertebral disc degeneration, lumbar region 03/01/2020   Spinal stenosis of lumbosacral region 03/01/2020   Pharmacologic therapy 02/06/2020   Lumbar radiculitis (Right) 09/21/2019   Chronic lower extremity pain (Bilateral) 09/06/2019   Intractable migraine with aura without status migrainosus 08/10/2019   Other specified dorsopathies, sacral and sacrococcygeal region 08/03/2019   Latex precautions, history of latex allergy 08/03/2019   History of allergy to radiographic contrast media 08/03/2019   DDD (degenerative disc disease), cervical 07/21/2019   Cervical facet syndrome (Bilateral) (L>R) 07/21/2019   DDD (degenerative disc disease), thoracic 07/21/2019   Osteoarthritis of hip (Left) 07/21/2019   Chronic groin pain (Bilateral) (L>R) 07/21/2019   Chronic hip pain (Bilateral) (L>R) 07/21/2019   Somatic dysfunction of sacroiliac joint (Bilateral) 07/21/2019   Migraine with aura and with status migrainosus, not intractable 04/06/2019   Cervico-occipital neuralgia (Left) 04/06/2019   Weakness of leg (Left) 04/05/2019   Difficulty walking 04/05/2019   Chronic migraine without aura, with intractable migraine, so stated, with status migrainosus 12/27/2018   Malar rash 09/04/2018   Trigger point of neck (Left) 03/19/2018   Occipital headache 12/25/2017   Chronic fatigue syndrome with fibromyalgia 12/12/2017   Trigger point of shoulder region (Left) 11/17/2017   Myofascial pain syndrome (Left) (trapezius muscle) 07/22/2017    Lumbar L1-2 disc protrusion (Right) 04/07/2017   Muscle spasticity 04/01/2017   Osteoarthritis of shoulder (Bilateral) 04/01/2017   Lumbar spondylosis 01/06/2017   Chronic hip pain (Left) 12/24/2016   Chronic sacroiliac joint pain (Left) 12/24/2016   Lumbar facet syndrome (Bilateral) (L>R) 12/24/2016   Lumbar radiculitis (Left) 12/24/2016   Hypertriglyceridemia 11/27/2016   History of vasovagal episode 10/30/2016   Cervicogenic headache 09/09/2016   Medication monitoring encounter 08/29/2016   Controlled substance agreement signed 08/28/2016   Plantar fasciitis of left foot 08/28/2016   Vitamin B12 deficiency 08/28/2016   Hyperlipidemia 08/28/2016   Nephrolithiasis 08/12/2016   Chronic pain syndrome 08/07/2016   Long term prescription opiate use 08/07/2016   Opiate use 08/07/2016   Long term prescription benzodiazepine use 08/07/2016   Neurogenic pain 08/07/2016   Chronic low back pain (1ry area of Pain) (Bilateral) (R>L) (midline) 08/07/2016   Chronic upper back pain (2ry area of Pain) (Bilateral) (L>R) 08/07/2016   Chronic abdominal pain (Right lower quadrant) 08/07/2016   Thoracic radiculitis (Bilateral: T10, T11) 08/07/2016   Chronic occipital neuralgia (3ry area of Pain) (  Bilateral) (L>R) 08/07/2016   Chronic neck pain 08/07/2016   Chronic cervical radicular pain (Bilateral) (L>R) 08/07/2016   Chronic shoulder blade pain (Bilateral) (L>R) 08/07/2016   Chronic upper extremity pain (Bilateral) (R>L) 08/07/2016   Chronic knee pain (Bilateral) (R>L) 08/07/2016   Chronic ankle pain (Bilateral) 08/07/2016   Cervical spondylosis with myelopathy and radiculopathy 08/07/2016   Panic disorder with agoraphobia 05/29/2016   Depression, unspecified depression type 05/29/2016   Atypical lymphocytosis 05/01/2016   Vitamin D insufficiency 05/01/2016   Chronic lower extremity cramps (Bilateral) (R>L) 04/29/2016   Obesity 04/29/2016   GAD (generalized anxiety disorder) 04/29/2016    Fatigue 04/29/2016   Insomnia 07/12/2015   Migraine without aura and with status migrainosus, not intractable 07/12/2015   Chronic superficial gastritis 06/02/2015   Chronic pain of multiple joints 05/15/2015   Bilateral leg edema 05/02/2015   Paroxysmal supraventricular tachycardia (HCC) 04/17/2015   Exertional shortness of breath 04/17/2015   Bright red rectal bleeding 04/06/2015   DDD (degenerative disc disease), lumbosacral 01/24/2014   DDD (degenerative disc disease), lumbar 01/24/2014   Cervico-occipital neuralgia 12/29/2013   Fibromyalgia 12/29/2013   Migraine headache 12/29/2013   Menorrhagia 12/10/2012   Depression, major, recurrent, in remission ([place]) 01/12/2009   Chest pain 01/12/2009   Hypertension, benign essential, goal below 140/90 06/23/2008   History of PSVT (paroxysmal supraventricular tachycardia) 06/17/2008   Obstructive sleep apnea 06/17/2008   GERD 06/13/2008    Past Surgical History:  Procedure Laterality Date   ABLATION     Uterine   CARDIAC CATHETERIZATION     with ablation   COLONOSCOPY WITH PROPOFOL N/A 05/17/2015   Procedure: COLONOSCOPY WITH PROPOFOL;  Surgeon: Manya Silvas, MD;  Location: [facility] ENDOSCOPY;  Service: Endoscopy;  Laterality: N/A;   COLONOSCOPY WITH PROPOFOL N/A 03/20/2020   Procedure: COLONOSCOPY WITH PROPOFOL;  Surgeon: Jonathon Bellows, MD;  Location: [facility] ENDOSCOPY;  Service: Gastroenterology;  Laterality: N/A;   ESOPHAGOGASTRODUODENOSCOPY N/A 05/17/2015   Procedure: ESOPHAGOGASTRODUODENOSCOPY (EGD);  Surgeon: Manya Silvas, MD;  Location: [facility] ENDOSCOPY;  Service: Endoscopy;  Laterality: N/A;   KNEE ARTHROSCOPY     spg     6/18   Tibial Tubercle Bypass Right 1998   TUBAL LIGATION  10/01/99    Family History  Problem Relation Age of Onset   Depression Mother    Hypertension Mother    Cancer Mother        Skin   Hyperlipidemia Mother    Anxiety disorder Mother    Migraines Mother    Alcohol abuse Father    Depression  Father    Stroke Father    Heart disease Father    Hypertension Father    Anxiety disorder Father    Depression Sister    Hyperlipidemia Sister    Diabetes Sister    Hypertension Sister    Polycystic ovary syndrome Sister    Bipolar disorder Sister    Anxiety disorder Sister    Migraines Sister    Cancer Maternal Grandmother 73       Breast   Thyroid disease Maternal Grandmother    Arthritis Maternal Grandmother    Hyperlipidemia Maternal Grandmother    Depression Sister    Hypertension Sister    Anxiety disorder Sister    Migraines Sister    Alzheimer's disease Other    Aneurysm Maternal Grandfather    Hypertension Maternal Grandfather    Heart disease Maternal Grandfather    Alzheimer's disease Paternal Grandmother    Heart attack Paternal Grandfather  Hypertension Paternal Grandfather    COPD Paternal Grandfather    Heart disease Paternal Grandfather    Migraines Son    Migraines Daughter    Migraines Daughter    Bladder Cancer Neg Hx    Kidney cancer Neg Hx     Social History   Tobacco Use   Smoking status: Former    Packs/day: 4.00    Years: 3.00    Pack years: 12.00    Types: Cigarettes    Quit date: 12/10/1992    Years since quitting: 28.6   Smokeless tobacco: Never   Tobacco comments:    quit 25 years ago  Vaping Use   Vaping Use: Never used  Substance Use Topics   Alcohol use: No    Alcohol/week: 0.0 standard drinks   Drug use: No     Allergies  Allergen Reactions   Aspirin Swelling   Cymbalta [Duloxetine Hcl] Other (See Comments)    Suicidal ideations and has homicidal thoughts per patient   Depakote [Divalproex Sodium] Shortness Of Breath    W/ n/v   Gadolinium Derivatives     Pt was unable to breath   Haloperidol Shortness Of Breath    W/ n/v   Meperidine Nausea And Vomiting    Patient projectile vomits and usually result in ER   Reglan [Metoclopramide] Shortness Of Breath    Can't breath, wheezes   Tramadol Hcl Palpitations     Severely and adversely affects her SVT giving her tachycardias of 150-160 bpm.   Trazodone Shortness Of Breath   Compazine [Prochlorperazine] Other (See Comments)    Panic attack   Meloxicam Other (See Comments)    mouth sores, tingling, blisters in mouth   Penicillins Rash   Tomato Hives    Tongue will blister   Other    Shellfish Allergy     Other reaction(s): Unknown   Shellfish-Derived Products Other (See Comments)   Bacitracin-Neomycin-Polymyxin Rash   Cephalosporins Rash    rash   Ibuprofen Other (See Comments) and Rash    Blisters in mouth. Blisters in mouth.   Latex Itching   Neosporin [Neomycin-Bacitracin Zn-Polymyx] Rash   Nsaids Other (See Comments)    Blisters in mouth; can take 1 ibuprofen 2x a month   Sulfa Antibiotics Rash    rash Other reaction(s): Unknown rash    Sulfonamide Derivatives Rash    Health Maintenance  Topic Date Due   Pneumococcal Vaccine 7-87 Years old (1 - PCV) Never done   Zoster Vaccines- Shingrix (1 of 2) Never done   COVID-19 Vaccine (3 - Moderna risk series) 02/22/2020   INFLUENZA VACCINE  04/30/2021   MAMMOGRAM  06/15/2021   TETANUS/TDAP  09/30/2021   PAP SMEAR-Modifier  11/14/2023   COLONOSCOPY (Pts 45-61yrs Insurance coverage will need to be confirmed)  03/20/2030   Hepatitis C Screening  Completed   HIV Screening  Completed   HPV VACCINES  Aged Out    Chart Review Today: I personally reviewed active problem list, medication list, allergies, family history, social history, health maintenance, notes from last encounter, lab results, imaging with the patient/caregiver today.   Review of Systems  Constitutional: Negative.   HENT: Negative.    Eyes: Negative.   Respiratory: Negative.    Cardiovascular: Negative.   Gastrointestinal: Negative.   Endocrine: Negative.   Genitourinary: Negative.   Musculoskeletal: Negative.   Skin: Negative.   Allergic/Immunologic: Negative.   Neurological: Negative.   Hematological:  Negative.   Psychiatric/Behavioral: Negative.  All other systems reviewed and are negative.   Objective:   Vitals:   07/30/21 1112  BP: 128/78  Pulse: (!) 110  Resp: 18  Temp: 99.1 F (37.3 C)  TempSrc: Oral  SpO2: 97%  Weight: 221 lb 4.8 oz (100.4 kg)  Height: 5\' 3"  (1.6 m)    Body mass index is 39.2 kg/m.  Physical Exam Vitals and nursing note reviewed.  Constitutional:      General: She is not in acute distress.    Appearance: Normal appearance. She is well-developed. She is obese. She is not ill-appearing, toxic-appearing or diaphoretic.     Interventions: Face mask in place.  HENT:     Head: Normocephalic and atraumatic.     Right Ear: External ear normal.     Left Ear: External ear normal.  Eyes:     General: Lids are normal. No scleral icterus.       Right eye: No discharge.        Left eye: No discharge.     Conjunctiva/sclera: Conjunctivae normal.  Neck:     Trachea: Phonation normal. No tracheal deviation.  Cardiovascular:     Rate and Rhythm: Regular rhythm. Tachycardia present.     Pulses: Normal pulses.          Radial pulses are 2+ on the right side and 2+ on the left side.       Posterior tibial pulses are 2+ on the right side and 2+ on the left side.     Heart sounds: Normal heart sounds. No murmur heard.   No friction rub. No gallop.  Pulmonary:     Effort: Pulmonary effort is normal. No respiratory distress.     Breath sounds: Normal breath sounds. No stridor. No wheezing, rhonchi or rales.  Chest:     Chest wall: No tenderness.  Abdominal:     General: Bowel sounds are normal. There is no distension.     Palpations: Abdomen is soft.  Musculoskeletal:     Right lower leg: No edema.     Left lower leg: No edema.  Skin:    General: Skin is warm and dry.     Coloration: Skin is not jaundiced or pale.     Findings: No rash.  Neurological:     Mental Status: She is alert.     Motor: No abnormal muscle tone.     Gait: Gait normal.   Psychiatric:        Mood and Affect: Mood normal.        Speech: Speech normal.        Behavior: Behavior normal.        Assessment & Plan:     ICD-10-CM   1. Hypertension, benign essential, goal below 140/90  I10 DISCONTINUED: metoprolol tartrate (LOPRESSOR) 50 MG tablet   BP well controlled but pt tachycardic, increase dose and f/up with cardiologist-    2. Mixed hyperlipidemia  E78.2    on lipitor, labs recently reviewed, toleratined, continue med and diet/lifestyle efforts as able    3. Reaction, situational, acute, to stress  F43.0 DISCONTINUED: LORazepam (ATIVAN) 1 MG tablet   daughter with cancer dx, upcoming invasive surgery - discussed meds to help with severe sx/anxiety, reviewed benefit, risk, SE    4. Tachycardia  R00.0 DISCONTINUED: LORazepam (ATIVAN) 1 MG tablet   she endorses due to stress but I reviewed prior cardiac hx and she needs to f/up with cardiology    5. Encounter for medication monitoring  Z51.81     6. Need for influenza vaccination  Z23 Flu Vaccine QUAD 6+ mos PF IM (Fluarix Quad PF)       No follow-ups on file.   Delsa Grana, PA-C 07/30/21 11:35 AM

## 2021-07-30 NOTE — Telephone Encounter (Signed)
Left v/m in regards to financial documents for [employer]. Will f/u with response.

## 2021-07-31 ENCOUNTER — Other Ambulatory Visit: Payer: Self-pay | Admitting: Physical Medicine and Rehabilitation

## 2021-07-31 MED ORDER — BUTALBITAL-APAP-CAFFEINE 50-325-40 MG PO TABS: 1.0000 | ORAL_TABLET | Freq: Three times a day (TID) | ORAL | 0 refills | Status: DC | PRN

## 2021-08-01 ENCOUNTER — Telehealth: Payer: Self-pay

## 2021-08-01 ENCOUNTER — Other Ambulatory Visit: Payer: Self-pay

## 2021-08-01 DIAGNOSIS — R000 Tachycardia, unspecified: Secondary | ICD-10-CM

## 2021-08-01 DIAGNOSIS — F430 Acute stress reaction: Secondary | ICD-10-CM

## 2021-08-01 DIAGNOSIS — I10 Essential (primary) hypertension: Secondary | ICD-10-CM

## 2021-08-01 NOTE — Telephone Encounter (Signed)
Copied from [place] [id number]. Topic: General - Other >> Aug 01, 2021  2:12 PM Leward Quan A wrote: Reason for CRM: Patient called in to inform Delsa Grana That she need her Rx sent to various pharmacies LORazepam (ATIVAN) 1 MG tablet [address], [place], [place] [address]  [phone] and the metoprolol tartrate (LOPRESSOR) 50 MG tablet to [facility] please advise can be reached for questions at Ph# [phone]

## 2021-08-02 ENCOUNTER — Other Ambulatory Visit: Payer: Self-pay

## 2021-08-02 ENCOUNTER — Encounter: Payer: Self-pay | Admitting: Physical Medicine and Rehabilitation

## 2021-08-02 MED ORDER — LORAZEPAM 1 MG PO TABS: 0.5000 mg | ORAL_TABLET | Freq: Two times a day (BID) | ORAL | 0 refills | Status: DC | PRN

## 2021-08-02 MED ORDER — METOPROLOL TARTRATE 50 MG PO TABS
75.0000 mg | ORAL_TABLET | Freq: Two times a day (BID) | ORAL | 3 refills | Status: DC
Start: 2021-08-02 — End: 2022-04-11
  Filled 2021-08-02: qty 90, 30d supply, fill #0

## 2021-08-03 ENCOUNTER — Other Ambulatory Visit: Payer: Self-pay

## 2021-08-03 ENCOUNTER — Encounter: Payer: Self-pay | Admitting: Physical Medicine and Rehabilitation

## 2021-08-03 ENCOUNTER — Other Ambulatory Visit: Payer: Self-pay | Admitting: Gastroenterology

## 2021-08-03 MED ORDER — PANCRELIPASE (LIP-PROT-AMYL) 12000-38000 UNITS PO CPEP
ORAL_CAPSULE | ORAL | 1 refills | Status: DC
  Filled 2021-08-03: qty 300, 38d supply, fill #0
  Filled 2021-09-13: qty 300, 38d supply, fill #1

## 2021-08-06 NOTE — Telephone Encounter (Signed)
Patient in process of applying for medicaid. Patient states she may be hard to reach via phone due to daughter sickness (cancer) but will f/u with response once she receives response from [employer] still pending.

## 2021-08-07 ENCOUNTER — Other Ambulatory Visit: Payer: Self-pay

## 2021-08-07 ENCOUNTER — Encounter: Payer: Self-pay | Admitting: Physical Medicine and Rehabilitation

## 2021-08-07 ENCOUNTER — Ambulatory Visit: Payer: Medicaid Other

## 2021-08-07 ENCOUNTER — Ambulatory Visit: Payer: Medicaid Other | Attending: Internal Medicine

## 2021-08-07 DIAGNOSIS — Z23 Encounter for immunization: Secondary | ICD-10-CM

## 2021-08-07 MED ORDER — PFIZER COVID-19 VAC BIVALENT 30 MCG/0.3ML IM SUSP
INTRAMUSCULAR | 0 refills | Status: DC
  Filled 2021-08-07: qty 0.3, 1d supply, fill #0

## 2021-08-07 NOTE — Progress Notes (Signed)
   Covid-19 Vaccination Clinic  Name:  Dana Bradley    MRN: [id number] DOB: [birth year]  08/07/2021  Ms. [name] was observed post Covid-19 immunization for 30 minutes based on pre-vaccination screening without incident. She was provided with Vaccine Information Sheet and instruction to access the V-Safe system.   Ms. [name] was instructed to call 911 with any severe reactions post vaccine: Difficulty breathing  Swelling of face and throat  A fast heartbeat  A bad rash all over body  Dizziness and weakness   Immunizations Administered     Name Date Dose VIS Date Route   Pfizer Covid-19 Vaccine Bivalent Booster 08/07/2021 11:12 AM 0.3 mL 05/30/2021 Intramuscular   Manufacturer: [place]   Lot: [id number]   [place]: [place], PharmD, MBA Clinical Acute Care Pharmacist

## 2021-08-13 ENCOUNTER — Telehealth: Payer: Self-pay | Admitting: Pharmacist

## 2021-08-13 NOTE — Telephone Encounter (Signed)
--   Elmer Picker - Monday, August 13, 2021 10:51 AM --Leanora Cover spoke with Erline Levine to place refill for Creon-allow 7-10 business days for [place] to receive at our office. They ship 30 day supply.

## 2021-08-16 ENCOUNTER — Other Ambulatory Visit: Payer: Self-pay | Admitting: Physical Medicine and Rehabilitation

## 2021-08-23 ENCOUNTER — Encounter: Payer: Self-pay | Admitting: Physical Medicine and Rehabilitation

## 2021-08-24 ENCOUNTER — Encounter: Payer: Self-pay | Admitting: Physical Medicine and Rehabilitation

## 2021-08-27 ENCOUNTER — Encounter: Payer: Self-pay | Admitting: Physical Medicine and Rehabilitation

## 2021-08-27 ENCOUNTER — Ambulatory Visit: Payer: Medicaid Other

## 2021-08-27 ENCOUNTER — Other Ambulatory Visit: Payer: Self-pay

## 2021-08-27 ENCOUNTER — Other Ambulatory Visit: Payer: Self-pay | Admitting: Physical Medicine and Rehabilitation

## 2021-08-27 ENCOUNTER — Telehealth: Payer: Medicaid Other | Admitting: Family Medicine

## 2021-08-27 DIAGNOSIS — Z79899 Other long term (current) drug therapy: Secondary | ICD-10-CM

## 2021-08-27 MED ORDER — CYANOCOBALAMIN 1000 MCG/ML IJ SOLN
1000.0000 ug | INTRAMUSCULAR | 3 refills | Status: DC
  Filled 2021-08-27 – 2021-08-28 (×2): qty 1, 30d supply, fill #0

## 2021-08-27 NOTE — Progress Notes (Signed)
[facility] Visit Note  Patient: Dana Bradley MRN: [id number] Date of Birth: Feb 22, 1971 PCP: Delsa Grana, PA-C   [name] 50 y.o. female presents for a yearly MTM visit today. Patient identified with two identifiers (name and DOB).   There were no vitals taken for this visit.  Patient Information   Past Medical History:  Diagnosis Date   Acute postoperative pain 04/07/2017   Anxiety    Bursitis    Chronic fatigue 12/12/2017   Chronic fatigue syndrome    Colitis 2021   Edema leg 05/02/2015   Fibromyalgia    GERD (gastroesophageal reflux disease)    IBS (irritable bowel syndrome)    Knee pain, bilateral 12/21/2008   Qualifier: Diagnosis of  By: Hassell Done FNP, [place]     Lumbar discitis    Migraines    Osteoarthritis    Right hand pain 04/10/2015   [facility] has done nerve conduction studies and ruled out carpal tunnel.    Sleep apnea    Spinal stenosis    SVT (supraventricular tachycardia) (HCC)    Vertigo    Vitamin D deficiency 05/01/2016      Past Surgical History:  Procedure Laterality Date   ABLATION     Uterine   CARDIAC CATHETERIZATION     with ablation   COLONOSCOPY WITH PROPOFOL N/A 05/17/2015   Procedure: COLONOSCOPY WITH PROPOFOL;  Surgeon: Manya Silvas, MD;  Location: [facility] ENDOSCOPY;  Service: Endoscopy;  Laterality: N/A;   COLONOSCOPY WITH PROPOFOL N/A 03/20/2020   Procedure: COLONOSCOPY WITH PROPOFOL;  Surgeon: Jonathon Bellows, MD;  Location: [facility] ENDOSCOPY;  Service: Gastroenterology;  Laterality: N/A;   ESOPHAGOGASTRODUODENOSCOPY N/A 05/17/2015   Procedure: ESOPHAGOGASTRODUODENOSCOPY (EGD);  Surgeon: Manya Silvas, MD;  Location: [facility] ENDOSCOPY;  Service: Endoscopy;  Laterality: N/A;   KNEE ARTHROSCOPY     spg     6/18   Tibial Tubercle Bypass Right 1998   TUBAL LIGATION  10/01/99     Family History  Problem Relation Age of Onset   Depression Mother    Hypertension Mother    Cancer Mother        Skin   Hyperlipidemia  Mother    Anxiety disorder Mother    Migraines Mother    Alcohol abuse Father    Depression Father    Stroke Father    Heart disease Father    Hypertension Father    Anxiety disorder Father    Depression Sister    Hyperlipidemia Sister    Diabetes Sister    Hypertension Sister    Polycystic ovary syndrome Sister    Bipolar disorder Sister    Anxiety disorder Sister    Migraines Sister    Cancer Maternal Grandmother 40       Breast   Thyroid disease Maternal Grandmother    Arthritis Maternal Grandmother    Hyperlipidemia Maternal Grandmother    Depression Sister    Hypertension Sister    Anxiety disorder Sister    Migraines Sister    Alzheimer's disease Other    Aneurysm Maternal Grandfather    Hypertension Maternal Grandfather    Heart disease Maternal Grandfather    Alzheimer's disease Paternal Grandmother    Heart attack Paternal Grandfather    Hypertension Paternal Grandfather    COPD Paternal Grandfather    Heart disease Paternal Grandfather    Migraines Son    Migraines Daughter    Migraines Daughter    Bladder Cancer Neg Hx    Kidney cancer Neg Hx  New Diagnoses (since last visit):   Family Support: Good  Lifestyle Diet: Breakfast: bowl of cereal, bacon, eggs, oatmeal  Lunch: usually skips, sandwich ~4pm  Dinner: lean meat and 3 vegetables, fish Drinks: coffee, water, sweet tea            Social History   Substance and Sexual Activity  Alcohol Use No   Alcohol/week: 0.0 standard drinks      Social History   Tobacco Use  Smoking Status Former   Packs/day: 4.00   Years: 3.00   Pack years: 12.00   Types: Cigarettes   Quit date: 12/10/1992   Years since quitting: 28.7  Smokeless Tobacco Never  Tobacco Comments   quit 25 years ago      Health Maintenance  Topic Date Due   Pneumococcal Vaccine 52-60 Years old (1 - PCV) Never done   Zoster Vaccines- Shingrix (1 of 2) Never done   MAMMOGRAM  06/15/2021   TETANUS/TDAP  09/30/2021    COVID-19 Vaccine (4 - Booster) 10/02/2021   PAP SMEAR-Modifier  11/14/2023   COLONOSCOPY (Pts 45-33yrs Insurance coverage will need to be confirmed)  03/20/2030   INFLUENZA VACCINE  Completed   Hepatitis C Screening  Completed   HIV Screening  Completed   HPV VACCINES  Aged Out   Health Maintenance/Date Completed  Last ED visit: N/A Last Visit to PCP: 07/30/2021 Next Visit to PCP: needs to schedule  Specialist Visit: 07/25/2021 Dental Exam: 2018 Eye Exam: 2020 Prostate Exam: N/A Pelvic/PAP Exam: Spring 2022 Mammogram: due, will schedule appointment  DEXA: not received  Colonoscopy: few years ago  Flu Vaccine: received  Pneumonia Vaccine: not received  COVID-19 Vaccine: received 3 vaccines  Shingrix Vaccine: not received    Outpatient Encounter Medications as of 08/27/2021  Medication Sig   amitriptyline (ELAVIL) 50 MG tablet Take 1 tablet (50 mg total) by mouth at bedtime.   atorvastatin (LIPITOR) 40 MG tablet TAKE 1 TABLET (40 MG TOTAL) BY MOUTH AT BEDTIME.   BAC 50-325-40 MG tablet TAKE 1 TABLET BY MOUTH EVERY 8 HOURS AS NEEDED FOR HEADACHE.   carbamazepine (TEGRETOL-XR) 100 MG 12 hr tablet Take 1 tablet (100 mg total) by mouth once daily at bedtime.   lipase/protease/amylase (CREON) 12000-38000 units CPEP capsule Take 2 capsules by mouth with the first bite of each meal and 1 capsule before snacks.   LORazepam (ATIVAN) 1 MG tablet Take 0.5 tablets (0.5 mg total) by mouth 2 (two) times daily as needed for anxiety.   medroxyPROGESTERone (PROVERA) 2.5 MG tablet Take 1 tablet (2.5 mg total) by mouth once daily.   metoprolol tartrate (LOPRESSOR) 50 MG tablet Take 1 and (1/2) tablets (75 mg total) by mouth 2 (two) times daily.   MIGRANAL 4 MG/ML nasal spray Spray 1 spray into one nostril as needed for migraine as directed. No more than 4 sprays in one hour. (Patient taking differently: Place 1 spray into the nose as needed.)   Needles & Syringes MISC For administration of B12  injections   omeprazole (PRILOSEC) 40 MG capsule Take 1 capsule (40 mg total) by mouth daily.   ondansetron (ZOFRAN) 4 MG tablet Take 1 tablet (4 mg total) by mouth every 8 (eight) hours as needed.   pregabalin (LYRICA) 150 MG capsule Take 1 capsule (150 mg total) by mouth every 8 (eight) hours.   Rimegepant Sulfate (NURTEC) 75 MG TBDP Take 1 tablet by mouth once every other day.   VALERIAN PO Take by mouth as needed. Makes  Valerian tea about 3-4 times per week.   baclofen (LIORESAL) 10 MG tablet TAKE ONE TABLET BY MOUTH 3 TIMES A DAY   COVID-19 mRNA bivalent vaccine, Pfizer, (PFIZER COVID-19 VAC BIVALENT) injection Inject into the muscle.   Cyanocobalamin (VITAMIN B-12) 500 MCG SUBL Place 1,500 mcg under the tongue daily.   diclofenac Sodium (VOLTAREN) 1 % GEL Apply 2 g topically 4 (four) times daily as needed.   dicyclomine (BENTYL) 10 MG capsule Take 1 capsule (10 mg total) by mouth 4 (four) times daily as needed (before meals and at bedtime)   sodium chloride 0.9 % SOLN 100 mL with Eptinezumab-jjmr 100 MG/ML SOLN 100 mg Inject 100 mg into the vein every 3 (three) months. (Patient not taking: Reported on 08/27/2021)   No facility-administered encounter medications on file as of 08/27/2021.     Assessment and Plan: Medication adherence: patient reports good adherence to medications and no missed doses. Is able to recall name, indication, strength, and directions Anxiety  --Currently receiving metoprolol and as needed lorazepam and reports no issues  Arthritis --Currently receiving as needed diclofenac gel GERD --Currently receiving dicyclomine, lipase, omeprazole, and as needed Zofran and reports no issues  HLD --Currently receiving atrovastatin and reports no issues  --Last LDL 82 on 07/06/2021 Migraines  --Currently receiving amitriptyline,  Sleep apnea  Neuropathic pain --Currently receiving carbamazepine, Nurtec, pregabalin, and as needed Migranal and Fioricet and reports no  issues  Patient was encouraged to continue making dietary changes and exercise as tolerated. Patient was agreeable to all suggestions. Plan to return to clinic in 1 year for annual MTM.  RTC: 1 year  Narda Rutherford, PharmD Pharmacy Resident  08/27/2021 3:27 PM

## 2021-08-28 ENCOUNTER — Encounter: Payer: Self-pay | Admitting: Physical Medicine and Rehabilitation

## 2021-08-28 ENCOUNTER — Other Ambulatory Visit: Payer: Self-pay

## 2021-08-29 ENCOUNTER — Other Ambulatory Visit: Payer: Self-pay

## 2021-08-30 ENCOUNTER — Other Ambulatory Visit: Payer: Self-pay

## 2021-08-30 ENCOUNTER — Telehealth (INDEPENDENT_AMBULATORY_CARE_PROVIDER_SITE_OTHER): Payer: Medicaid Other | Admitting: Nurse Practitioner

## 2021-08-30 DIAGNOSIS — F3340 Major depressive disorder, recurrent, in remission, unspecified: Secondary | ICD-10-CM | POA: Diagnosis not present

## 2021-08-30 DIAGNOSIS — F411 Generalized anxiety disorder: Secondary | ICD-10-CM

## 2021-08-30 NOTE — Progress Notes (Signed)
Name: Dana Bradley   MRN: [id number]    DOB: 1971-03-17   Date:08/30/2021       Progress Note  Subjective  Chief Complaint  Chief Complaint  Patient presents with   Anxiety   Follow-up    I connected with  [name]  on 08/30/21 at  2:20 PM EST by a video enabled telemedicine application and verified that I am speaking with the correct person using two identifiers.  I discussed the limitations of evaluation and management by telemedicine and the availability of in person appointments. The patient expressed understanding and agreed to proceed with a virtual visit  Staff also discussed with the patient that there may be a patient responsible charge related to this service. Patient Location: home Provider Location: [facility] Additional Individuals present: alone  HPI  Anxiety and depression: She says she suffers from depression and is going to see psychiatry for that.  She says she also has a lot of anxiety because of her daughter's health problems.  She says that she feels like the anxiety she is having is normal for her situation.  She says that she only takes the ativan every once in awhile.  She says she does not need a refill at this time. Her GAD score is 16 today and her PHQ9 is 15.  She denies any suicidal thoughts.   GAD 7 : Generalized Anxiety Score 08/30/2021 06/18/2021 07/03/2020  Nervous, Anxious, on Edge 3 2 1   Control/stop worrying 3 2 1   Worry too much - different things 2 2 1   Trouble relaxing 2 2 2   Restless 2 1 0  Easily annoyed or irritable 2 3 3   Afraid - awful might happen 2 3 1   Total GAD 7 Score 16 15 9   Anxiety Difficulty Extremely difficult Somewhat difficult -     Depression screen [facility] 2/9 08/30/2021 07/30/2021 07/06/2021 06/25/2021 06/18/2021  Decreased Interest 2 2 3  0 2  Down, Depressed, Hopeless 2 2 3  0 2  PHQ - 2 Score 4 4 6  0 4  Altered sleeping 2 2 1  - 3  Tired, decreased energy 2 2 1  - 3  Change in appetite 2 2 0 - 2  Feeling bad or failure about yourself   2 2 1  - 2  Trouble concentrating 3 2 1  - 3  Moving slowly or fidgety/restless 0 1 0 - 0  Suicidal thoughts 0 0 0 - 0  PHQ-9 Score 15 15 10  - 17  Difficult doing work/chores Somewhat difficult Extremely dIfficult Somewhat difficult - Very difficult  Some recent data might be hidden     Patient Active Problem List   Diagnosis Date Noted   Lateral epicondylitis, left elbow 07/19/2021   Abnormal MRI, cervical spine (02/14/2021) 02/28/2021   Cervicalgia 02/13/2021   Abnormal MRI, lumbar spine (03/16/2020) 11/09/2020   Chronic midline thoracic back pain 11/09/2020   Abnormal MRI, thoracic spine (08/02/2019) 11/09/2020   Thoracic spinal stenosis (T7-8) 11/09/2020   Prolapse of thoracic disc with radiculopathy (T7-8) 11/09/2020   Spasm of muscle of lower back 11/09/2020   Vertigo, benign paroxysmal, unspecified laterality 48/54/6270   Uncomplicated opioid dependence ([place]) 11/08/2020   Hyperalgesia 03/07/2020   Neurogenic urinary incontinence 03/01/2020   Other intervertebral disc degeneration, lumbar region 03/01/2020   Spinal stenosis of lumbosacral region 03/01/2020   Pharmacologic therapy 02/06/2020   Lumbar radiculitis (Right) 09/21/2019   Chronic lower extremity pain (Bilateral) 09/06/2019   Intractable migraine with aura without status migrainosus 08/10/2019  Other specified dorsopathies, sacral and sacrococcygeal region 08/03/2019   Latex precautions, history of latex allergy 08/03/2019   History of allergy to radiographic contrast media 08/03/2019   DDD (degenerative disc disease), cervical 07/21/2019   Cervical facet syndrome (Bilateral) (L>R) 07/21/2019   DDD (degenerative disc disease), thoracic 07/21/2019   Osteoarthritis of hip (Left) 07/21/2019   Chronic groin pain (Bilateral) (L>R) 07/21/2019   Chronic hip pain (Bilateral) (L>R) 07/21/2019   Somatic dysfunction of sacroiliac joint (Bilateral) 07/21/2019   Migraine with aura and with status migrainosus, not intractable  04/06/2019   Cervico-occipital neuralgia (Left) 04/06/2019   Weakness of leg (Left) 04/05/2019   Difficulty walking 04/05/2019   Chronic migraine without aura, with intractable migraine, so stated, with status migrainosus 12/27/2018   Malar rash 09/04/2018   Trigger point of neck (Left) 03/19/2018   Occipital headache 12/25/2017   Chronic fatigue syndrome with fibromyalgia 12/12/2017   Trigger point of shoulder region (Left) 11/17/2017   Myofascial pain syndrome (Left) (trapezius muscle) 07/22/2017   Lumbar L1-2 disc protrusion (Right) 04/07/2017   Muscle spasticity 04/01/2017   Osteoarthritis of shoulder (Bilateral) 04/01/2017   Lumbar spondylosis 01/06/2017   Chronic hip pain (Left) 12/24/2016   Chronic sacroiliac joint pain (Left) 12/24/2016   Lumbar facet syndrome (Bilateral) (L>R) 12/24/2016   Lumbar radiculitis (Left) 12/24/2016   Hypertriglyceridemia 11/27/2016   History of vasovagal episode 10/30/2016   Cervicogenic headache 09/09/2016   Medication monitoring encounter 08/29/2016   Controlled substance agreement signed 08/28/2016   Plantar fasciitis of left foot 08/28/2016   Vitamin B12 deficiency 08/28/2016   Hyperlipidemia 08/28/2016   Nephrolithiasis 08/12/2016   Chronic pain syndrome 08/07/2016   Long term prescription opiate use 08/07/2016   Opiate use 08/07/2016   Long term prescription benzodiazepine use 08/07/2016   Neurogenic pain 08/07/2016   Chronic low back pain (1ry area of Pain) (Bilateral) (R>L) (midline) 08/07/2016   Chronic upper back pain (2ry area of Pain) (Bilateral) (L>R) 08/07/2016   Chronic abdominal pain (Right lower quadrant) 08/07/2016   Thoracic radiculitis (Bilateral: T10, T11) 08/07/2016   Chronic occipital neuralgia (3ry area of Pain) (Bilateral) (L>R) 08/07/2016   Chronic neck pain 08/07/2016   Chronic cervical radicular pain (Bilateral) (L>R) 08/07/2016   Chronic shoulder blade pain (Bilateral) (L>R) 08/07/2016   Chronic upper extremity  pain (Bilateral) (R>L) 08/07/2016   Chronic knee pain (Bilateral) (R>L) 08/07/2016   Chronic ankle pain (Bilateral) 08/07/2016   Cervical spondylosis with myelopathy and radiculopathy 08/07/2016   Panic disorder with agoraphobia 05/29/2016   Depression, unspecified depression type 05/29/2016   Atypical lymphocytosis 05/01/2016   Vitamin D insufficiency 05/01/2016   Chronic lower extremity cramps (Bilateral) (R>L) 04/29/2016   Obesity 04/29/2016   GAD (generalized anxiety disorder) 04/29/2016   Fatigue 04/29/2016   Insomnia 07/12/2015   Migraine without aura and with status migrainosus, not intractable 07/12/2015   Chronic superficial gastritis 06/02/2015   Chronic pain of multiple joints 05/15/2015   Bilateral leg edema 05/02/2015   Paroxysmal supraventricular tachycardia ([place]) 04/17/2015   Exertional shortness of breath 04/17/2015   Bright red rectal bleeding 04/06/2015   DDD (degenerative disc disease), lumbosacral 01/24/2014   DDD (degenerative disc disease), lumbar 01/24/2014   Cervico-occipital neuralgia 12/29/2013   Fibromyalgia 12/29/2013   Migraine headache 12/29/2013   Menorrhagia 12/10/2012   Depression, major, recurrent, in remission ([place]) 01/12/2009   Chest pain 01/12/2009   Hypertension, benign essential, goal below 140/90 06/23/2008   History of PSVT (paroxysmal supraventricular tachycardia) 06/17/2008   Obstructive sleep  apnea 06/17/2008   GERD 06/13/2008    Social History   Tobacco Use   Smoking status: Former    Packs/day: 4.00    Years: 3.00    Pack years: 12.00    Types: Cigarettes    Quit date: 12/10/1992    Years since quitting: 28.7   Smokeless tobacco: Never   Tobacco comments:    quit 25 years ago  Substance Use Topics   Alcohol use: No    Alcohol/week: 0.0 standard drinks     Current Outpatient Medications:    amitriptyline (ELAVIL) 50 MG tablet, Take 1 tablet (50 mg total) by mouth at bedtime., Disp: 30 tablet, Rfl: 3   atorvastatin  (LIPITOR) 40 MG tablet, TAKE 1 TABLET (40 MG TOTAL) BY MOUTH AT BEDTIME., Disp: 90 tablet, Rfl: 3   BAC 50-325-40 MG tablet, TAKE 1 TABLET BY MOUTH EVERY 8 HOURS AS NEEDED FOR HEADACHE., Disp: 40 tablet, Rfl: 3   carbamazepine (TEGRETOL-XR) 100 MG 12 hr tablet, Take 1 tablet (100 mg total) by mouth once daily at bedtime., Disp: 30 tablet, Rfl: 3   COVID-19 mRNA bivalent vaccine, Pfizer, (PFIZER COVID-19 VAC BIVALENT) injection, Inject into the muscle., Disp: 0.3 mL, Rfl: 0   cyanocobalamin (,VITAMIN B-12,) 1000 MCG/ML injection, Inject 1 mL (1,000 mcg total) into the muscle once every 30 (thirty) days., Disp: 1 mL, Rfl: 3   Cyanocobalamin (VITAMIN B-12) 500 MCG SUBL, Place 1,500 mcg under the tongue daily., Disp: 150 tablet, Rfl:    lipase/protease/amylase (CREON) 12000-38000 units CPEP capsule, Take 2 capsules by mouth with the first bite of each meal and 1 capsule before snacks., Disp: 240 capsule, Rfl: 1   LORazepam (ATIVAN) 1 MG tablet, Take 0.5 tablets (0.5 mg total) by mouth 2 (two) times daily as needed for anxiety., Disp: 30 tablet, Rfl: 0   medroxyPROGESTERone (PROVERA) 2.5 MG tablet, Take 1 tablet (2.5 mg total) by mouth once daily., Disp: 90 tablet, Rfl: 1   metoprolol tartrate (LOPRESSOR) 50 MG tablet, Take 1 and (1/2) tablets (75 mg total) by mouth 2 (two) times daily., Disp: 135 tablet, Rfl: 3   MIGRANAL 4 MG/ML nasal spray, Spray 1 spray into one nostril as needed for migraine as directed. No more than 4 sprays in one hour. (Patient taking differently: Place 1 spray into the nose as needed.), Disp: 8 mL, Rfl: 3   Needles & Syringes MISC, For administration of B12 injections, Disp: 30 each, Rfl: 0   omeprazole (PRILOSEC) 40 MG capsule, Take 1 capsule (40 mg total) by mouth daily., Disp: 90 capsule, Rfl: 3   ondansetron (ZOFRAN) 4 MG tablet, Take 1 tablet (4 mg total) by mouth every 8 (eight) hours as needed., Disp: 20 tablet, Rfl: 2   pregabalin (LYRICA) 150 MG capsule, Take 1 capsule  (150 mg total) by mouth every 8 (eight) hours., Disp: 270 capsule, Rfl: 1   Rimegepant Sulfate (NURTEC) 75 MG TBDP, Take 1 tablet by mouth once every other day., Disp: 30 tablet, Rfl: 3   sodium chloride 0.9 % SOLN 100 mL with Eptinezumab-jjmr 100 MG/ML SOLN 100 mg, Inject 100 mg into the vein every 3 (three) months., Disp: 1 each, Rfl: 3   VALERIAN PO, Take by mouth as needed. Makes Valerian tea about 3-4 times per week., Disp: , Rfl:    baclofen (LIORESAL) 10 MG tablet, TAKE ONE TABLET BY MOUTH 3 TIMES A DAY, Disp: 90 tablet, Rfl: 2   diclofenac Sodium (VOLTAREN) 1 % GEL, Apply 2 g  topically 4 (four) times daily as needed., Disp: 350 g, Rfl: PRN   dicyclomine (BENTYL) 10 MG capsule, Take 1 capsule (10 mg total) by mouth 4 (four) times daily as needed (before meals and at bedtime), Disp: 360 capsule, Rfl: 1  Allergies  Allergen Reactions   Aspirin Swelling   Cymbalta [Duloxetine Hcl] Other (See Comments)    Suicidal ideations and has homicidal thoughts per patient   Depakote [Divalproex Sodium] Shortness Of Breath    W/ n/v   Gadolinium Derivatives     Pt was unable to breath   Haloperidol Shortness Of Breath    W/ n/v   Meperidine Nausea And Vomiting    Patient projectile vomits and usually result in ER   Reglan [Metoclopramide] Shortness Of Breath    Can't breath, wheezes   Tramadol Hcl Palpitations    Severely and adversely affects her SVT giving her tachycardias of 150-160 bpm.   Trazodone Shortness Of Breath   Compazine [Prochlorperazine] Other (See Comments)    Panic attack   Meloxicam Other (See Comments)    mouth sores, tingling, blisters in mouth   Penicillins Rash   Tomato Hives    Tongue will blister   Other    Shellfish Allergy     Other reaction(s): Unknown   Shellfish-Derived Products Other (See Comments)   Bacitracin-Neomycin-Polymyxin Rash   Cephalosporins Rash    rash   Ibuprofen Other (See Comments) and Rash    Blisters in mouth. Blisters in mouth.    Latex Itching   Neosporin [Neomycin-Bacitracin Zn-Polymyx] Rash   Nsaids Other (See Comments)    Blisters in mouth; can take 1 ibuprofen 2x a month   Sulfa Antibiotics Rash    rash Other reaction(s): Unknown rash    Sulfonamide Derivatives Rash    I personally reviewed active problem list, medication list, allergies, notes from last encounter with the patient/caregiver today.  ROS  Constitutional: Negative for fever or weight change.  Respiratory: Negative for cough and shortness of breath.   Cardiovascular: Negative for chest pain or palpitations.  Gastrointestinal: Negative for abdominal pain, no bowel changes.  Musculoskeletal: Negative for gait problem or joint swelling.  Skin: Negative for rash.  Neurological: Negative for dizziness or headache.  No other specific complaints in a complete review of systems (except as listed in HPI above).   Objective  Virtual encounter, vitals not obtained.  There is no height or weight on file to calculate BMI.  Nursing Note and Vital Signs reviewed.  Physical Exam  Awake, alert and oriented  No results found for this or any previous visit (from the past 22 hour(s)).  Assessment & Plan  1. Depression, major, recurrent, in remission ([place]) - get appointment with psychiatry  2. GAD (generalized anxiety disorder) -continue current treatment plan  -Red flags and when to present for emergency care or RTC including fever >101.82F, chest pain, shortness of breath, new/worsening/un-resolving symptoms,  reviewed with patient at time of visit. Follow up and care instructions discussed and provided in AVS. - I discussed the assessment and treatment plan with the patient. The patient was provided an opportunity to ask questions and all were answered. The patient agreed with the plan and demonstrated an understanding of the instructions.  I provided 15 minutes of non-face-to-face time during this encounter.  Bo Merino, FNP

## 2021-09-03 ENCOUNTER — Other Ambulatory Visit: Payer: Self-pay

## 2021-09-03 MED ORDER — NURTEC 75 MG PO TBDP: 1.0000 | ORAL_TABLET | ORAL | 3 refills | Status: DC

## 2021-09-04 ENCOUNTER — Other Ambulatory Visit: Payer: Self-pay | Admitting: Internal Medicine

## 2021-09-04 DIAGNOSIS — N6311 Unspecified lump in the right breast, upper outer quadrant: Secondary | ICD-10-CM

## 2021-09-04 DIAGNOSIS — N644 Mastodynia: Secondary | ICD-10-CM

## 2021-09-10 ENCOUNTER — Telehealth: Payer: Self-pay | Admitting: *Deleted

## 2021-09-10 NOTE — Telephone Encounter (Signed)
Prior auth submitted and approved for Nurtec 75 mg (#8 for 16 days). PA Case: [id number], Status: Approved, Coverage Starts on: 09/10/2021 12:00:00 AM, Coverage Ends on: 12/09/2021 12:00:00 AM. Pharmacy notified.

## 2021-09-13 ENCOUNTER — Ambulatory Visit
Admission: RE | Admit: 2021-09-13 | Discharge: 2021-09-13 | Disposition: A | Discharge status: Home / Self Care | Payer: Medicaid Other | Source: Ambulatory Visit | Attending: Internal Medicine | Admitting: Internal Medicine

## 2021-09-13 ENCOUNTER — Other Ambulatory Visit: Payer: Self-pay | Admitting: Gastroenterology

## 2021-09-13 ENCOUNTER — Other Ambulatory Visit: Payer: Self-pay

## 2021-09-13 ENCOUNTER — Other Ambulatory Visit: Payer: Self-pay | Admitting: Internal Medicine

## 2021-09-13 DIAGNOSIS — N644 Mastodynia: Secondary | ICD-10-CM

## 2021-09-13 DIAGNOSIS — N6452 Nipple discharge: Secondary | ICD-10-CM

## 2021-09-13 DIAGNOSIS — N6311 Unspecified lump in the right breast, upper outer quadrant: Secondary | ICD-10-CM

## 2021-09-13 DIAGNOSIS — Z1239 Encounter for other screening for malignant neoplasm of breast: Secondary | ICD-10-CM

## 2021-09-13 DIAGNOSIS — R922 Inconclusive mammogram: Secondary | ICD-10-CM | POA: Diagnosis not present

## 2021-09-14 ENCOUNTER — Other Ambulatory Visit: Payer: Self-pay

## 2021-09-14 ENCOUNTER — Encounter: Payer: Self-pay | Admitting: Physical Medicine and Rehabilitation

## 2021-09-14 MED FILL — Pancrelipase (Lip-Prot-Amyl) DR Cap 12000-38000-60000 Unit: ORAL | 38 days supply | Qty: 300 | Fill #0 | Status: CN

## 2021-09-17 ENCOUNTER — Other Ambulatory Visit: Payer: Self-pay

## 2021-09-18 ENCOUNTER — Other Ambulatory Visit: Payer: Self-pay

## 2021-09-18 MED ORDER — PANCRELIPASE (LIP-PROT-AMYL) 36000-114000 UNITS PO CPEP: ORAL_CAPSULE | ORAL | 11 refills | Status: DC

## 2021-09-19 ENCOUNTER — Other Ambulatory Visit: Payer: Self-pay

## 2021-09-19 ENCOUNTER — Encounter: Payer: Self-pay | Admitting: Physical Medicine and Rehabilitation

## 2021-09-19 MED ORDER — PANCRELIPASE (LIP-PROT-AMYL) 12000-38000 UNITS PO CPEP
12000.0000 [IU] | ORAL_CAPSULE | Freq: Three times a day (TID) | ORAL | 1 refills | Status: DC
  Filled 2021-09-19: qty 300, 38d supply, fill #0

## 2021-09-20 ENCOUNTER — Encounter: Payer: Self-pay | Admitting: Physical Medicine and Rehabilitation

## 2021-09-20 ENCOUNTER — Other Ambulatory Visit: Payer: Self-pay

## 2021-09-21 ENCOUNTER — Telehealth: Payer: Self-pay | Admitting: Pharmacy Technician

## 2021-09-21 NOTE — Telephone Encounter (Signed)
Patient has prescription drug coverage with [facility].  Patient no longer meets [facility]'s eligibility criteria.  Patient notified by letter.  [place] [facility] [facility]

## 2021-09-21 NOTE — Telephone Encounter (Signed)
°  [place], [place]  [address]  September 21, 2021    Kleigh Hoelzer [address] [place], [place]  [address]  Dear Marcene Brawn:  This is to inform you that you are no longer eligible to receive medication assistance at [facility].  The reason(s) are:    _____Your total gross monthly household income exceeds 250% of the Federal Poverty Level.   _____Tangible assets (savings, checking, stocks/bonds, pension, retirement, etc.) exceeds our limit  _____You are eligible to receive benefits from [facility], [facility] or HIV Medication            Assistance program _____You are eligible to receive benefits from a Medicare Part D plan __X__You have prescription insurance with [employer]. _____You are not an [facility] resident _____Failure to provide all requested documentation (proof of income information for 2022., and/or Patient Intake Application, Reader, Contract, etc).    We regret that we are unable to help you at this time.  If your prescription coverage is terminated, please contact [facility], so that we may reassess your eligibility for our program.  If you have questions, we may be contacted at [phone].  Thank you,  [facility]

## 2021-09-26 ENCOUNTER — Telehealth: Payer: Self-pay | Admitting: *Deleted

## 2021-09-26 NOTE — Telephone Encounter (Addendum)
Prior Dana Bradley was submitted by Dr Ranell Patrick for VYEPTI to [facility]. Approval received 09/25/21- 03/24/22. Will be scanned into media for future if medication ordered.

## 2021-09-27 ENCOUNTER — Other Ambulatory Visit: Payer: Self-pay

## 2021-09-27 NOTE — Telephone Encounter (Signed)
Patient has been approved for [facility] since 7/22,(prior to coming to [facility])  assuming patient was not aware or notified.  Per Luevenia Maxin they reached out to Providers office to set-up deliver arrangement several times with no success. Deliver of medication has been arranged to be delivered to [place].  We will schedule patient once medication arrives.  I have re-enrolled patient for 2023.  Patient will need to provide financial documentation to remain enrolled program. I have called patient and left v/m.  Patient has also provided new insurance card and I have submitted along with re-enrollement forms.  Patient will remain in program until 10/31/21, upon which she will need proof of income to remain in program

## 2021-09-28 ENCOUNTER — Encounter
Payer: Medicaid Other | Attending: Physical Medicine and Rehabilitation | Admitting: Physical Medicine and Rehabilitation

## 2021-09-28 ENCOUNTER — Other Ambulatory Visit: Payer: Self-pay

## 2021-09-28 ENCOUNTER — Telehealth: Payer: Self-pay

## 2021-09-28 ENCOUNTER — Encounter: Payer: Self-pay | Admitting: Physical Medicine and Rehabilitation

## 2021-09-28 VITALS — BP 118/80 | HR 94 | Ht 63.0 in | Wt 230.4 lb

## 2021-09-28 DIAGNOSIS — H8113 Benign paroxysmal vertigo, bilateral: Secondary | ICD-10-CM

## 2021-09-28 DIAGNOSIS — M797 Fibromyalgia: Secondary | ICD-10-CM | POA: Diagnosis not present

## 2021-09-28 DIAGNOSIS — M5442 Lumbago with sciatica, left side: Secondary | ICD-10-CM | POA: Diagnosis not present

## 2021-09-28 DIAGNOSIS — K580 Irritable bowel syndrome with diarrhea: Secondary | ICD-10-CM | POA: Diagnosis not present

## 2021-09-28 DIAGNOSIS — R112 Nausea with vomiting, unspecified: Secondary | ICD-10-CM | POA: Insufficient documentation

## 2021-09-28 DIAGNOSIS — G8929 Other chronic pain: Secondary | ICD-10-CM | POA: Insufficient documentation

## 2021-09-28 DIAGNOSIS — M5441 Lumbago with sciatica, right side: Secondary | ICD-10-CM | POA: Insufficient documentation

## 2021-09-28 DIAGNOSIS — G43001 Migraine without aura, not intractable, with status migrainosus: Secondary | ICD-10-CM | POA: Insufficient documentation

## 2021-09-28 MED ORDER — PREGABALIN 150 MG PO CAPS: 150.0000 mg | ORAL_CAPSULE | Freq: Three times a day (TID) | ORAL | 3 refills | Status: DC

## 2021-09-28 MED ORDER — MECLIZINE HCL 12.5 MG PO TABS: 12.5000 mg | ORAL_TABLET | Freq: Three times a day (TID) | ORAL | 0 refills | Status: AC | PRN

## 2021-09-28 MED ORDER — NURTEC 75 MG PO TBDP: 1.0000 | ORAL_TABLET | ORAL | 3 refills | Status: DC

## 2021-09-28 NOTE — Progress Notes (Addendum)
Subjective:    Patient ID: Dana Bradley, female    DOB: [birth year], 50 y.o.   MRN: [id number]  HPI   1) Chronic Migraine:  -Dana Bradley [place] a 50 year old woman who presents for f/u of her chronic migraine  Terrible stomach spasms and diarrhea. 12 hours later debilitating migraine. It usually starts in the afternoon. She feels that this may be part of her prodrome. She also has terrible vertigo that is getting worse. The Fiorcet helps a lot, the Migronal helps with the frontal headache. These make the pain bearable.  -having migraines close to every 24H -she is not functional -she knows it is stress related -she feels a pain radiating down her arm as well. It also runs into her head and into her face.  -Sometimes her whole left side is fizzy.  -she has tried Amitriptyline.  -have been awful since December. Had a migraine from Dec 1st throughout 9th.  -the day before she was incapacitated.  -Vyepti has been approved -The nurtec and the Fiorocet help together to break the migraine.  -she needs a refill of her Fioricet and nausea, she finds this helpful.  -Nurtec- she is still working with the company to get this improved -she asks about prodrome and postdrome (she experiences cramping and diarrhea).  -on Sunday she closed out a 56 hour migraines. -she has been trying to follow ketogenic diet.  -Nurtec helping- 5 days in a row without a migraines.  -she feels if she could take it as a rescue medication in addition to every other day this may help.   2) Vertigo -Symptoms are worsening.  -She feels that this may be leading up to the vertigo.  -She is interested in vestibular therapy.  -she had a heating pad on the shoulder and arm.   3) Cervical myofasical pain syndrome: -Muscles of neck are very tight, pain is worse on the left side.  -She did not find spinal injections helpful, they made the pain worse.   4) Insomnia: she sleeps in chunks -she is willing to increase amitriptyline to  50mg .   5) Cognitive impairments: -she loves to cook and noticed she has been making more mistakes -she thinks the headaches are a problem.  6) Pain radiating from back into legs -feels twitches -worse at night -legs kick out at night.   7) IBS -terrible in the night -2am on Sunday. -horrible pain -often with diarrhea and improves symptoms after she has diarrhea.  -cramping.   Dana Bradley is a 50 year old woman who presents for follow-up of her chronic migraine. Her average pain is 6/10 and pain right now is 6/10. Her pain is intermittent, sharp, burning, stabbing, tingling, and aching.   She recently had 7 days without migraine. She is not sure what was associated with this. She had a pretty terrible bounceback.  She picked up the Migranal nasal spray. Our CMA explained how it works.   Her pain doctor is going to stop prescribing the Lyrica and she needs to find a new doctor to prescribe this.    Pain Inventory Average Pain 6 Pain Right Now 5 My pain is intermittent, sharp, burning, stabbing and tingling  In the last 24 hours, has pain interfered with the following? General activity 4 Relation with others 5 Enjoyment of life 7 What TIME of day is your pain at its worst? evening Sleep (in general) Fair  Pain is worse with: bending and some activites Pain improves with: rest and  medication Relief from Meds: 5     Family History  Problem Relation Age of Onset   Depression Mother    Hypertension Mother    Cancer Mother        Skin   Hyperlipidemia Mother    Anxiety disorder Mother    Migraines Mother    Alcohol abuse Father    Depression Father    Stroke Father    Heart disease Father    Hypertension Father    Anxiety disorder Father    Depression Sister    Hyperlipidemia Sister    Diabetes Sister    Hypertension Sister    Polycystic ovary syndrome Sister    Bipolar disorder Sister    Anxiety disorder Sister    Migraines Sister    Depression Sister     Hypertension Sister    Anxiety disorder Sister    Migraines Sister    Migraines Daughter    Migraines Daughter    Breast cancer Maternal Grandmother 61   Cancer Maternal Grandmother 63       Breast   Thyroid disease Maternal Grandmother    Arthritis Maternal Grandmother    Hyperlipidemia Maternal Grandmother    Aneurysm Maternal Grandfather    Hypertension Maternal Grandfather    Heart disease Maternal Grandfather    Alzheimer's disease Paternal Grandmother    Heart attack Paternal Grandfather    Hypertension Paternal Grandfather    COPD Paternal Grandfather    Heart disease Paternal Grandfather    Migraines Son    Alzheimer's disease Other    Bladder Cancer Neg Hx    Kidney cancer Neg Hx    Social History   Socioeconomic History   Marital status: Married    Spouse name: brian   Number of children: 3   Years of education: Not on file   Highest education level: Associate degree: academic program  Occupational History   Occupation: disbled    Comment: not able  Tobacco Use   Smoking status: Former    Packs/day: 4.00    Years: 3.00    Pack years: 12.00    Types: Cigarettes    Quit date: 12/10/1992    Years since quitting: 28.8   Smokeless tobacco: Never   Tobacco comments:    quit 25 years ago  Vaping Use   Vaping Use: Never used  Substance and Sexual Activity   Alcohol use: No    Alcohol/week: 0.0 standard drinks   Drug use: No   Sexual activity: Not Currently    Partners: Male    Birth control/protection: Surgical  Other Topics Concern   Not on file  Social History Narrative   Lives at home with her husband and 2 of her children   Right handed   Caffeine: 0-2 cups daily   Social Determinants of Health   Financial Resource Strain: Not on file  Food Insecurity: Not on file  Transportation Needs: Not on file  Physical Activity: Not on file  Stress: Not on file  Social Connections: Not on file   Past Surgical History:  Procedure Laterality Date    ABLATION     Uterine   BREAST BIOPSY Left    2014 U/S bx fibroadenoma   CARDIAC CATHETERIZATION     with ablation   COLONOSCOPY WITH PROPOFOL N/A 05/17/2015   Procedure: COLONOSCOPY WITH PROPOFOL;  Surgeon: Manya Silvas, MD;  Location[facility] ENDOSCOPY;  Service: Endoscopy;  Laterality: N/A;   COLONOSCOPY WITH PROPOFOL N/A 03/20/2020   Procedure: COLONOSCOPY  WITH PROPOFOL;  Surgeon: Jonathon Bellows, MD;  Location: [facility] ENDOSCOPY;  Service: Gastroenterology;  Laterality: N/A;   ESOPHAGOGASTRODUODENOSCOPY N/A 05/17/2015   Procedure: ESOPHAGOGASTRODUODENOSCOPY (EGD);  Surgeon: Manya Silvas, MD;  Location: [facility] ENDOSCOPY;  Service: Endoscopy;  Laterality: N/A;   KNEE ARTHROSCOPY     spg     6/18   Tibial Tubercle Bypass Right 1998   TUBAL LIGATION  10/01/1999   Past Surgical History:  Procedure Laterality Date   ABLATION     Uterine   BREAST BIOPSY Left    2014 U/S bx fibroadenoma   CARDIAC CATHETERIZATION     with ablation   COLONOSCOPY WITH PROPOFOL N/A 05/17/2015   Procedure: COLONOSCOPY WITH PROPOFOL;  Surgeon: Manya Silvas, MD;  Location[facility] ENDOSCOPY;  Service: Endoscopy;  Laterality: N/A;   COLONOSCOPY WITH PROPOFOL N/A 03/20/2020   Procedure: COLONOSCOPY WITH PROPOFOL;  Surgeon: Jonathon Bellows, MD;  Location: [facility] ENDOSCOPY;  Service: Gastroenterology;  Laterality: N/A;   ESOPHAGOGASTRODUODENOSCOPY N/A 05/17/2015   Procedure: ESOPHAGOGASTRODUODENOSCOPY (EGD);  Surgeon: Manya Silvas, MD;  Location: [facility] ENDOSCOPY;  Service: Endoscopy;  Laterality: N/A;   KNEE ARTHROSCOPY     spg     6/18   Tibial Tubercle Bypass Right 1998   TUBAL LIGATION  10/01/1999   Past Medical History:  Diagnosis Date   Acute postoperative pain 04/07/2017   Anxiety    Bursitis    Chronic fatigue 12/12/2017   Chronic fatigue syndrome    Colitis 2021   Edema leg 05/02/2015   Fibromyalgia    GERD (gastroesophageal reflux disease)    IBS (irritable bowel syndrome)    Knee pain, bilateral  12/21/2008   Qualifier: Diagnosis of  By: Hassell Done FNP, [place]     Lumbar discitis    Migraines    Osteoarthritis    Right hand pain 04/10/2015   [facility] has done nerve conduction studies and ruled out carpal tunnel.    Sleep apnea    Spinal stenosis    SVT (supraventricular tachycardia) (HCC)    Vertigo    Vitamin D deficiency 05/01/2016   There were no vitals taken for this visit.  Opioid Risk Score:   Fall Risk Score:  `1  Depression screen PHQ 2/9  Depression screen [facility] 2/9 08/30/2021 07/30/2021 07/06/2021 06/25/2021 06/18/2021 03/06/2021 02/05/2021  Decreased Interest 2 2 3  0 2 0 0  Down, Depressed, Hopeless 2 2 3  0 2 0 0  PHQ - 2 Score 4 4 6  0 4 0 0  Altered sleeping 2 2 1  - 3 - -  Tired, decreased energy 2 2 1  - 3 - -  Change in appetite 2 2 0 - 2 - -  Feeling bad or failure about yourself  2 2 1  - 2 - -  Trouble concentrating 3 2 1  - 3 - -  Moving slowly or fidgety/restless 0 1 0 - 0 - -  Suicidal thoughts 0 0 0 - 0 - -  PHQ-9 Score 15 15 10  - 17 - -  Difficult doing work/chores Somewhat difficult Extremely dIfficult Somewhat difficult - Very difficult - -  Some recent data might be hidden   Review of Systems  Constitutional: Negative.   HENT: Negative.    Eyes: Negative.   Respiratory: Negative.    Cardiovascular: Negative.   Gastrointestinal: Negative.   Endocrine: Negative.   Genitourinary: Negative.   Musculoskeletal:  Positive for back pain.       Left side of body from head to  toes  Skin: Negative.   Allergic/Immunologic: Negative.   Neurological: Negative.   Hematological: Negative.   Psychiatric/Behavioral: Negative.    All other systems reviewed and are negative.     Objective:   Physical Exam Gen: no distress, normal appearing HEENT: oral mucosa pink and moist, NCAT Cardio: Reg rate Chest: normal effort, normal rate of breathing Abd: soft, non-distended Ext: no edema Psych: pleasant, normal affect Skin: intact Neuro: Alert and oriented  x3 Musculoskeletal: Severe tightness in her left upper back/neck muscles. Limited range of motion in left arm due to shooting pain. +Slump test right >left. No focal weakness.      Assessment & Plan:  Mrs. Brendlinger is a 50 year old woman who presents for f/u with chronic intractable migraines s/p numerous treatments, severe fibromyalgia, and IBS, and nausea, and vertigo.     Vertigo: In the future, restart for cervical myofascial pain syndrome: myofascial release, postural correction, stretching and strengthening of the muscles of the neck and upper back, development of HEP. Continue heating pads to muscles of upper back and neck. She is doing HEP.  -referred for vestibular therapy.    Migraines: Refilled Fioricet, which is one of the medications that helps her. Refilled today. She takes this at least once every time she gets a migraine. Advised to use upon migraine onset and to not use more frequently than q6H during migraine. Refilled Zofran for nausea last week, and this has been helping her. Continue metoprolol which can be helpful in migraine prophylaxis (HR is well controlled). Prescribed ergot nasal spray to try upon migraine initiation- advised no more frequently than 4 sprays per hour (still awaiting on prior auth). Discussed avoiding foods that may trigger migraines.  -Discussed that I can provide refill for her Lyrica when she needs it.  -topamax was not effective.  -When she gets the migraines her loss of words is getting worse.  -Ordered Vyepti- she will find out cost from her pharmacy. She will bring paperwork for this next visit. She is excited to try it.  -continue ear piercing. Pain feels more like pressure and less acidic.  -no benefits from Botox.  -continue to track migraines.  -Continue Baclofen for pain relief. Minimize use of Hydrocodone. She is only taking Lyrica once per day to make it last. Continue Migranal which is helping. Will occipital nerve block -Discussed that Mrs.  Bills's greatest source of happiness is her family. This community will be essential in helping her recover from her chronic pain and to increase her daily activity. Her daughter is also suffering from similar migraines unfortunately.  -Continue ginger, herbs, turmeric, blueberries, eating real food. Continue cutting down sugar. Using local honey is a great alternative. -Provided with a pain relief journal and discussed that it contains foods and lifestyle tips to naturally help to improve pain. Discussed that these lifestyle strategies are also very good for health unlike some medications which can have negative side effects. Discussed that the act of keeping a journal can be therapeutic and helpful to realize patterns what helps to trigger and alleviate pain.   -Continue Nurtec every other day- may use for breakthrough migraines as well.  -Discussed plan for Vypeti at [facility] infusion center.  Discussed that exercise is one of the most effective treatments for fibromyalgia. This will also help with her obesity. Made goal with Mrs. Eimer to walk outside her home at least once per day, and to garden at least once per day (her favorite activity). Can use  elliptical which she has at home on rainy days. The heat has been oppressive and so she has been trying to do the latter more.  Foods to alleviate migraine:  1) dark leafy greens 2) avocado 3) tuna 4) samon and mackerel 5) beans and legumes Supplements that can be helpful: feverfew, B12, and magnesium  Foods to avoid in migraine: 1) Excessive (or irregular timing) coffee 2) red wine 3) aged cheeses 4) chocolate 5) citrus fruits 6) aspartame and other artifical sweeteners 7) yeast 8) MSG (in processed foods) 9) processed and cured meats 10) nuts and certain seeds 11) chicken livers and other organ meats 12) dairy products like buttermilk, sour cream, and yogurt 13) dried fruits like dates, figs, and raisins 14) garlic 15) onions 16)  potato chips 17) pickled foods like olives and sauerkraut 18) some fresh fruits like ripe banana, papaya, red plums, raspberries, kiwi, pineapple 19) tomato-based products  Recommend to keep a migraine diary: rate daily the severity of your headache (1-10) and what foods you eat that day to help determine patterns.    Cervical facet arthrosis: Cervical XR normal- discussed results with patient. Pain is worse on left side.   Stress: -Her daughter was recently diagnosed with sarcoma and this is a great source of stress and fear to Mrs. [name]. It has been a turbulent time for her and this has understandably worsened her symptoms. Discussed benefits of gratitude journaling and she plans to try this.   Insomnia:  -continue amitirptyline to 50mg .  -Try to go outside near sunrise -Get exercise during the day.  -Discussed good sleep hygiene: turning off all devices an hour before bedtime.  -Chamomile tea with dinner.  -Can consider over the counter melatonin -discussed magnesium  Nausea:  -continue zofran -start B6   Cervical myofascial pain syndrome: Will try some trigger point injections with occipital nerve block next visit.   IBS: -continue kombucha, yogurt (she makes her own)  Trigeminal Neuralgia: - continue Carbamazepine.  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 Letta Pate for ESI unchanged. Shallow broad-based disc protrusion L4-5 unchanged. -continue Norco -continue turmeric Turmeric to reduce inflammation--can be used in cooking or taken as a supplement.  Benefits of turmeric:  -Highly anti-inflammatory  -Increases antioxidants  -Improves memory, attention, brain disease  -Lowers risk of heart disease  -May help prevent cancer  -Decreases pain  -Alleviates depression  -Delays aging and decreases risk of chronic disease  -Consume with black pepper to  increase absorption    Turmeric Milk Recipe:  1 cup milk  1 tsp turmeric  1 tsp cinnamon  1 tsp grated ginger (optional)  Black pepper (boosts the anti-inflammatory properties of turmeric).  1 tsp honey     >40 minutes spent in discussion of migraines, diet, providing pain journal, use of turmeric, mold in the house, housing situation, refilling Lyrica, Nurtec, discussion of response to Fioricet, Nurtec, her daughter's surgery, baking sourdough bread, avoiding lectins, allergies, bee pollen, spending time outdoors, growing foods in their garden.

## 2021-09-28 NOTE — Patient Instructions (Signed)
Linwood Dibbles lectins

## 2021-09-28 NOTE — Telephone Encounter (Signed)
[facility] Patient Assistance Program is no longer needed. Patient stated she has Medicaid now. She is not allowed any assistance from the program while receiving Medicaid.

## 2021-09-28 NOTE — Addendum Note (Signed)
Addended by: Izora Ribas on: 09/28/2021 12:22 PM   Modules accepted: Level of Service

## 2021-10-02 ENCOUNTER — Ambulatory Visit: Admission: RE | Admit: 2021-10-02 | Payer: Medicaid Other | Source: Ambulatory Visit

## 2021-10-03 ENCOUNTER — Other Ambulatory Visit: Payer: Self-pay

## 2021-10-05 ENCOUNTER — Other Ambulatory Visit: Payer: Self-pay

## 2021-10-05 ENCOUNTER — Ambulatory Visit (INDEPENDENT_AMBULATORY_CARE_PROVIDER_SITE_OTHER): Payer: Medicaid Other

## 2021-10-05 VITALS — BP 115/62 | HR 87 | Temp 98.2°F | Resp 20 | Ht 63.0 in | Wt 231.2 lb

## 2021-10-05 DIAGNOSIS — G43711 Chronic migraine without aura, intractable, with status migrainosus: Secondary | ICD-10-CM | POA: Diagnosis not present

## 2021-10-05 MED ORDER — SODIUM CHLORIDE 0.9 % IV SOLN
100.0000 mg | Freq: Once | INTRAVENOUS | Status: AC
  Administered 2021-10-05: 100 mg via INTRAVENOUS
  Filled 2021-10-05: qty 1

## 2021-10-05 MED ORDER — METHYLPREDNISOLONE SODIUM SUCC 125 MG IJ SOLR: 125.0000 mg | Freq: Once | INTRAMUSCULAR | Status: DC | PRN

## 2021-10-05 MED ORDER — SODIUM CHLORIDE 0.9% FLUSH: 10.0000 mL | Freq: Once | INTRAVENOUS | Status: DC | PRN

## 2021-10-05 MED ORDER — HEPARIN SOD (PORK) LOCK FLUSH 100 UNIT/ML IV SOLN: 500.0000 [IU] | Freq: Once | INTRAVENOUS | Status: DC | PRN

## 2021-10-05 MED ORDER — ANTICOAGULANT SODIUM CITRATE 4% (200MG/5ML) IV SOLN
5.0000 mL | Freq: Once | Status: DC | PRN
  Filled 2021-10-05: qty 5

## 2021-10-05 MED ORDER — ALBUTEROL SULFATE HFA 108 (90 BASE) MCG/ACT IN AERS: 2.0000 | INHALATION_SPRAY | Freq: Once | RESPIRATORY_TRACT | Status: DC | PRN

## 2021-10-05 MED ORDER — FAMOTIDINE IN NACL 20-0.9 MG/50ML-% IV SOLN: 20.0000 mg | Freq: Once | INTRAVENOUS | Status: DC | PRN

## 2021-10-05 MED ORDER — SODIUM CHLORIDE 0.9% FLUSH: 3.0000 mL | Freq: Once | INTRAVENOUS | Status: DC | PRN

## 2021-10-05 MED ORDER — ALTEPLASE 2 MG IJ SOLR: 2.0000 mg | Freq: Once | INTRAMUSCULAR | Status: DC | PRN

## 2021-10-05 MED ORDER — SODIUM CHLORIDE 0.9 % IV SOLN: Freq: Once | INTRAVENOUS | Status: DC | PRN

## 2021-10-05 MED ORDER — HEPARIN SOD (PORK) LOCK FLUSH 100 UNIT/ML IV SOLN: 250.0000 [IU] | Freq: Once | INTRAVENOUS | Status: DC | PRN

## 2021-10-05 MED ORDER — EPINEPHRINE 0.3 MG/0.3ML IJ SOAJ: 0.3000 mg | Freq: Once | INTRAMUSCULAR | Status: DC | PRN

## 2021-10-05 MED ORDER — DIPHENHYDRAMINE HCL 50 MG/ML IJ SOLN: 50.0000 mg | Freq: Once | INTRAMUSCULAR | Status: DC | PRN

## 2021-10-05 NOTE — Progress Notes (Signed)
Diagnosis: Intractable Chronic Migraine  Provider:  Marshell Garfinkel, MD  Procedure: Infusion  IV Type: Peripheral, IV Location: L Antecubital  Vyepti, Dose: 100 mg  Infusion Start Time: 11.21 10/05/2021  Infusion Stop Time: 11.58 10/05/2021  Post Infusion IV Care: 30 minutes Observation period completed and Peripheral IV Discontinued  Discharge: Condition: Good, Destination: Home . AVS provided to patient.   Performed by:  Arnoldo Morale, RN

## 2021-10-05 NOTE — Patient Instructions (Signed)
Eptinezumab injection What is this medication? EPTINEZUMAB (EP ti NEZ ue mab) is used to prevent migraine headaches. This medicine may be used for other purposes; ask your health care provider or pharmacist if you have questions. COMMON BRAND NAME(S): Vyepti What should I tell my care team before I take this medication? They need to know if you have any of these conditions: an unusual or allergic reaction to eptinezumab, other medicines, foods, dyes, or preservatives pregnant or trying to get pregnant breast-feeding How should I use this medication? This medicine is for infusion into a vein. It is given by a health care professional in a hospital or clinic setting. Talk to your pediatrician about the use of this medicine in children. Special care may be needed. Overdosage: If you think you have taken too much of this medicine contact a poison control center or emergency room at once. NOTE: This medicine is only for you. Do not share this medicine with others. What if I miss a dose? Keep appointments for follow-up doses. It is important not to miss your dose. Call your doctor or health care professional if you are unable to keep an appointment. What may interact with this medication? Interactions are not expected. This list may not describe all possible interactions. Give your health care provider a list of all the medicines, herbs, non-prescription drugs, or dietary supplements you use. Also tell them if you smoke, drink alcohol, or use illegal drugs. Some items may interact with your medicine. What should I watch for while using this medication? Your condition will be monitored carefully while you are receiving this medicine. What side effects may I notice from receiving this medication? Side effects that you should report to your doctor or health care professional as soon as possible: allergic reactions like skin rash, itching or hives; swelling of the face, lips, or tongue Side effects  that usually do not require medical attention (report these to your doctor or health care professional if they continue or are bothersome): sore throat This list may not describe all possible side effects. Call your doctor for medical advice about side effects. You may report side effects to FDA at 1-800-FDA-1088. Where should I keep my medication? This medicine is given in a hospital or clinic and will not be stored at home. NOTE: This sheet is a summary. It may not cover all possible information. If you have questions about this medicine, talk to your doctor, pharmacist, or health care provider.  2022 Elsevier/Gold Standard (2018-11-25 00:00:00)

## 2021-10-17 NOTE — Telephone Encounter (Addendum)
Auth Submission: APPROVED   Payer: [place] medicaid [employer] Medication & CPT/J Code(s) submitted: Vyepti (Eptinezumab) [id number] Route of submission (phone, fax, portal): PHONE Auth type: Patient received x1 dose from [employer]. (Exp 09/29/22) prior to getting PA approved. (Note: MD office submitted PA) Units/visits requested: 100 MG Q8WKS Reference number: [id number] Approval from: 09/25/21 to 03/24/22 (Pharmacy Benefit)  Medical Benefit = not covered service. (vyepti is not a covered service under the medical benefit - pharmacy benefit only) Phone: [phone] REF# [id number]  patient brought in approval letter from healthy blue

## 2021-10-23 ENCOUNTER — Telehealth: Payer: Self-pay

## 2021-10-23 DIAGNOSIS — G4733 Obstructive sleep apnea (adult) (pediatric): Secondary | ICD-10-CM

## 2021-10-23 NOTE — Telephone Encounter (Signed)
Pt is requesting a prescription or referral for a new cpap machine. She has been on it for years

## 2021-10-24 NOTE — Telephone Encounter (Signed)
Patient just needs a rx for cpap machine and supplies, no new study

## 2021-10-29 ENCOUNTER — Other Ambulatory Visit: Payer: Self-pay | Admitting: Pharmacy Technician

## 2021-10-31 ENCOUNTER — Encounter: Payer: Self-pay | Admitting: Physical Medicine and Rehabilitation

## 2021-10-31 ENCOUNTER — Telehealth: Payer: Self-pay | Admitting: Physical Medicine and Rehabilitation

## 2021-10-31 NOTE — Telephone Encounter (Signed)
I am attempting Prior auth for [facility] for Dana Bradley There is no documentation to the following:   So I am contacting the patient to have Dr Lilia Argue begin the documentation for this process .  AIM requirements : [id number] AA&/STRD TFRML EPI LUMBAR/SACRAL 1 LEVEL (L1) Based on the information you have provided, your request does not meet medical necessity criteria due to the following: Documentation of pain that has not responded to at least four (4) weeks of conservative management is required for therapeutic injection. Documentation of significant pain which is associated with inability to perform activities of daily living (ADLs) is required for therapeutic injection. Documentation of conservative management which includes either physical therapy or a therapeutic home exercise program, or proof that participation in therapy is not possible is required. In addition to the physical therapy requirement, documentation of at least one of the following is required: chiropractic care, acupuncture, cognitive behavioral therapy, massage therapy, or anti-inflammatory medications. Isolated neck pain, mid-back pain, or low back pain are not considered appropriate indications for this procedure.

## 2021-11-01 ENCOUNTER — Encounter: Payer: Self-pay | Admitting: Physical Medicine and Rehabilitation

## 2021-11-01 ENCOUNTER — Other Ambulatory Visit: Payer: Self-pay | Admitting: Physical Medicine and Rehabilitation

## 2021-11-01 ENCOUNTER — Other Ambulatory Visit (HOSPITAL_COMMUNITY): Payer: Self-pay

## 2021-11-01 ENCOUNTER — Encounter: Payer: Self-pay | Admitting: Obstetrics and Gynecology

## 2021-11-01 DIAGNOSIS — G4733 Obstructive sleep apnea (adult) (pediatric): Secondary | ICD-10-CM

## 2021-11-01 DIAGNOSIS — Z9889 Other specified postprocedural states: Secondary | ICD-10-CM

## 2021-11-02 ENCOUNTER — Other Ambulatory Visit: Payer: Self-pay | Admitting: Physical Medicine and Rehabilitation

## 2021-11-02 ENCOUNTER — Encounter: Payer: Self-pay | Admitting: Physical Medicine and Rehabilitation

## 2021-11-02 MED ORDER — VYEPTI 100 MG/ML IV SOLN: 100.0000 mg | Freq: Once | INTRAVENOUS | 4 refills | Status: AC

## 2021-11-03 MED ORDER — MEDROXYPROGESTERONE ACETATE 5 MG PO TABS: 5.0000 mg | ORAL_TABLET | Freq: Every day | ORAL | 1 refills | Status: DC

## 2021-11-07 NOTE — Telephone Encounter (Addendum)
VYEPTI: Clear bagging: [facility]. Script has been sent to [facility].

## 2021-11-09 ENCOUNTER — Other Ambulatory Visit: Payer: Self-pay

## 2021-11-09 ENCOUNTER — Encounter: Payer: Self-pay | Admitting: Physical Medicine & Rehabilitation

## 2021-11-09 ENCOUNTER — Encounter: Payer: Medicaid Other | Attending: Physical Medicine and Rehabilitation | Admitting: Physical Medicine & Rehabilitation

## 2021-11-09 VITALS — BP 126/80 | HR 83 | Ht 63.0 in | Wt 214.8 lb

## 2021-11-09 DIAGNOSIS — M797 Fibromyalgia: Secondary | ICD-10-CM | POA: Diagnosis not present

## 2021-11-09 DIAGNOSIS — M5416 Radiculopathy, lumbar region: Secondary | ICD-10-CM | POA: Insufficient documentation

## 2021-11-09 NOTE — Progress Notes (Signed)
Subjective:    Patient ID: Dana Bradley, female    DOB: [birth year], 51 y.o.   MRN: [id number]  HPI  51 year old female with chronic widespread pain diagnosed with fibromyalgia as well as chronic neck pain chronic low back pain who was sent to me to evaluate for interventional pain management.   Upper lumbar to hip pain is the worst pain.  She does have pain in all 4 limbs as well as her trunk. Had a year of PT in 2021, patient states this was not helpful for her pain complaints.  Tried cervical ESI C7-T1 this was not helpful. Completed per Dr Consuela Mimes:    Therapeutic left C7-T1 cervical ESI x2 (10/30/2016) not helpful Therapeutic bilateral lumbar facet block x1 (01/06/2017) helpful short-term Therapeutic left SI joint block x2 (08/03/2019) not helpful Therapeutic right L1-2 LESI x1 (12/16/2017) helpful short-term Therapeutic left L1-2 LESI x1 (04/07/2017) helpful short-term Therapeutic bilateral L2 TFESI x2 (11/09/2020) helpful short-term Therapeutic left L4-5 LESI x1 (09/07/2019) not helpful Therapeutic left trapezius muscle TPI/MNB x3 (03/19/2018)  Therapeutic right trapezius muscle trigger point injection x1 (11/18/2017)  Therapeutic left occipital nerve RFA x1 (12/25/2017) not very helpful Therapeutic left C2 + TON RFA x1 (12/25/2017) not very helpful Therapeutic left caudal ESI x1 (09/28/2019) not very helpful Therapeutic right T8-9 thoracic ESI x1 (11/09/2020) partially helpful  L1-2 ESI worked for 6-8 months initially , now 56mo duration as of last treatment. Patient has back pain that radiates into the hip she has lateral hip as well as groin pain. Pain Inventory Average Pain 5 Pain Right Now 3 My pain is sharp, burning, dull, stabbing, tingling, and aching  In the last 24 hours, has pain interfered with the following? General activity 5 Relation with others 4 Enjoyment of life 4 What TIME of day is your pain at its worst? morning  and evening Sleep (in general) Poor  Pain is worse  with: walking, bending, sitting, standing, and some activites Pain improves with: heat/ice, pacing activities, TENS, and injections Relief from Meds:  not answered  Family History  Problem Relation Age of Onset   Depression Mother    Hypertension Mother    Cancer Mother        Skin   Hyperlipidemia Mother    Anxiety disorder Mother    Migraines Mother    Alcohol abuse Father    Depression Father    Stroke Father    Heart disease Father    Hypertension Father    Anxiety disorder Father    Depression Sister    Hyperlipidemia Sister    Diabetes Sister    Hypertension Sister    Polycystic ovary syndrome Sister    Bipolar disorder Sister    Anxiety disorder Sister    Migraines Sister    Depression Sister    Hypertension Sister    Anxiety disorder Sister    Migraines Sister    Migraines Daughter    Migraines Daughter    Breast cancer Maternal Grandmother 107   Cancer Maternal Grandmother 63       Breast   Thyroid disease Maternal Grandmother    Arthritis Maternal Grandmother    Hyperlipidemia Maternal Grandmother    Aneurysm Maternal Grandfather    Hypertension Maternal Grandfather    Heart disease Maternal Grandfather    Alzheimer's disease Paternal Grandmother    Heart attack Paternal Grandfather    Hypertension Paternal Grandfather    COPD Paternal Grandfather    Heart disease Paternal Grandfather    Migraines Son  Alzheimer's disease Other    Bladder Cancer Neg Hx    Kidney cancer Neg Hx    Social History   Socioeconomic History   Marital status: Married    Spouse name: brian   Number of children: 3   Years of education: Not on file   Highest education level: Associate degree: academic program  Occupational History   Occupation: disbled    Comment: not able  Tobacco Use   Smoking status: Former    Packs/day: 4.00    Years: 3.00    Pack years: 12.00    Types: Cigarettes    Quit date: 12/10/1992    Years since quitting: 28.9   Smokeless tobacco:  Never   Tobacco comments:    quit 25 years ago  Vaping Use   Vaping Use: Never used  Substance and Sexual Activity   Alcohol use: No    Alcohol/week: 0.0 standard drinks   Drug use: No   Sexual activity: Not Currently    Partners: Male    Birth control/protection: Surgical  Other Topics Concern   Not on file  Social History Narrative   Lives at home with her husband and 2 of her children   Right handed   Caffeine: 0-2 cups daily   Social Determinants of Health   Financial Resource Strain: Not on file  Food Insecurity: Not on file  Transportation Needs: Not on file  Physical Activity: Not on file  Stress: Not on file  Social Connections: Not on file   Past Surgical History:  Procedure Laterality Date   ABLATION     Uterine   BREAST BIOPSY Left    2014 U/S bx fibroadenoma   CARDIAC CATHETERIZATION     with ablation   COLONOSCOPY WITH PROPOFOL N/A 05/17/2015   Procedure: COLONOSCOPY WITH PROPOFOL;  Surgeon: Manya Silvas, MD;  Location: [facility] ENDOSCOPY;  Service: Endoscopy;  Laterality: N/A;   COLONOSCOPY WITH PROPOFOL N/A 03/20/2020   Procedure: COLONOSCOPY WITH PROPOFOL;  Surgeon: Jonathon Bellows, MD;  Location: [facility] ENDOSCOPY;  Service: Gastroenterology;  Laterality: N/A;   ESOPHAGOGASTRODUODENOSCOPY N/A 05/17/2015   Procedure: ESOPHAGOGASTRODUODENOSCOPY (EGD);  Surgeon: Manya Silvas, MD;  Location: [facility] ENDOSCOPY;  Service: Endoscopy;  Laterality: N/A;   KNEE ARTHROSCOPY     spg     6/18   Tibial Tubercle Bypass Right 1998   TUBAL LIGATION  10/01/1999   Past Surgical History:  Procedure Laterality Date   ABLATION     Uterine   BREAST BIOPSY Left    2014 U/S bx fibroadenoma   CARDIAC CATHETERIZATION     with ablation   COLONOSCOPY WITH PROPOFOL N/A 05/17/2015   Procedure: COLONOSCOPY WITH PROPOFOL;  Surgeon: Manya Silvas, MD;  Location: [facility] ENDOSCOPY;  Service: Endoscopy;  Laterality: N/A;   COLONOSCOPY WITH PROPOFOL N/A 03/20/2020   Procedure:  COLONOSCOPY WITH PROPOFOL;  Surgeon: Jonathon Bellows, MD;  Location: [facility] ENDOSCOPY;  Service: Gastroenterology;  Laterality: N/A;   ESOPHAGOGASTRODUODENOSCOPY N/A 05/17/2015   Procedure: ESOPHAGOGASTRODUODENOSCOPY (EGD);  Surgeon: Manya Silvas, MD;  Location: [facility] ENDOSCOPY;  Service: Endoscopy;  Laterality: N/A;   KNEE ARTHROSCOPY     spg     6/18   Tibial Tubercle Bypass Right 1998   TUBAL LIGATION  10/01/1999   Past Medical History:  Diagnosis Date   Acute postoperative pain 04/07/2017   Anxiety    Bursitis    Chronic fatigue 12/12/2017   Chronic fatigue syndrome    Colitis 2021   Edema leg 05/02/2015  Fibromyalgia    GERD (gastroesophageal reflux disease)    IBS (irritable bowel syndrome)    Knee pain, bilateral 12/21/2008   Qualifier: Diagnosis of  By: Hassell Done FNP, Nykedtra     Lumbar discitis    Migraines    Osteoarthritis    Right hand pain 04/10/2015   [facility] has done nerve conduction studies and ruled out carpal tunnel.    Sleep apnea    Spinal stenosis    SVT (supraventricular tachycardia) (HCC)    Vertigo    Vitamin D deficiency 05/01/2016   BP 126/80    Pulse 83    Ht 5\' 3"  (1.6 m)    Wt 214 lb 12.8 oz (97.4 kg)    SpO2 96%    BMI 38.05 kg/m   Opioid Risk Score:   Fall Risk Score:  `1  Depression screen PHQ 2/9  Depression screen [facility] 2/9 11/09/2021 09/28/2021 08/30/2021 07/30/2021 07/06/2021 06/25/2021 06/18/2021  Decreased Interest 0 0 2 2 3  0 2  Down, Depressed, Hopeless 0 0 2 2 3  0 2  PHQ - 2 Score 0 0 4 4 6  0 4  Altered sleeping - - 2 2 1  - 3  Tired, decreased energy - - 2 2 1  - 3  Change in appetite - - 2 2 0 - 2  Feeling bad or failure about yourself  - - 2 2 1  - 2  Trouble concentrating - - 3 2 1  - 3  Moving slowly or fidgety/restless - - 0 1 0 - 0  Suicidal thoughts - - 0 0 0 - 0  PHQ-9 Score - - 15 15 10  - 17  Difficult doing work/chores - - Somewhat difficult Extremely dIfficult Somewhat difficult - Very difficult  Some recent data might be  hidden     Review of Systems  Constitutional: Negative.   HENT: Negative.    Eyes: Negative.   Respiratory: Negative.    Cardiovascular: Negative.   Gastrointestinal: Negative.   Endocrine: Negative.   Genitourinary: Negative.   Musculoskeletal:        Left arm and pain down both legs  Skin: Negative.   Allergic/Immunologic: Negative.   Neurological: Negative.   Hematological: Negative.   Psychiatric/Behavioral: Negative.    All other systems reviewed and are negative.     Objective:   Physical Exam Constitutional:      Appearance: She is obese.  HENT:     Head: Normocephalic and atraumatic.  Musculoskeletal:     Comments: There is some lateral hip pain with hip internal and external rotation no groin pain. Negative straight leg raising No evidence of knee effusion or pain with knee range of motion. Tenderness over the greater trochanters bilaterally.  Neurological:     Mental Status: She is alert.     Comments: Motor strength is 5/5 bilateral hip flexor knee extensor ankle dorsiflexor Normal sensation in the lower extremities. Normal tone in lower extremities.  Patient has tenderness in 18/18 fibromyalgia tender points. Ambulates with a cane       Assessment & Plan:  1.  Chronic widespread pain her major diagnosis fibromyalgia syndrome.  She does have some focal pain in [place] at specific sites including trochanteric bursa, low back with probable right L1-L2 radiculopathy intermittent without focal weakness. She may benefit from trochanteric bursa injections which Dr. Alfonso Patten can perform. In terms of epidural injections she may benefit from right L1-L2 transforaminal injection however her duration of response has been about 2 months, this can  be repeated every 3 to 4 months if patient desires.  She is not on any anticoagulant medications which would need to be stopped.  She has tried medication management as well as physical therapy within the last 2 years.  I do not think  she needs any additional imaging studies at this time.

## 2021-11-20 ENCOUNTER — Ambulatory Visit: Payer: Medicaid Other | Admitting: Physical Medicine and Rehabilitation

## 2021-11-28 ENCOUNTER — Telehealth: Payer: Self-pay

## 2021-11-28 NOTE — Telephone Encounter (Signed)
PA Case: [id number], Status: Approved, Coverage Starts on: 11/28/2021 12:00:00 AM, Coverage Ends on: 11/28/2022 12:00:00 AM. ?

## 2021-11-28 NOTE — Telephone Encounter (Signed)
PA for Nurtec sent to insurance through CoverMyMeds ?

## 2021-11-30 ENCOUNTER — Other Ambulatory Visit: Payer: Self-pay

## 2021-12-04 ENCOUNTER — Other Ambulatory Visit: Payer: Self-pay | Admitting: Physical Medicine and Rehabilitation

## 2021-12-04 ENCOUNTER — Telehealth: Payer: Self-pay

## 2021-12-04 MED ORDER — CARBAMAZEPINE ER 100 MG PO TB12: 100.0000 mg | ORAL_TABLET | Freq: Every day | ORAL | 3 refills | Status: DC

## 2021-12-04 NOTE — Telephone Encounter (Signed)
Refill request for Tegretol XR. I do not see it on patient's med list ?

## 2021-12-06 ENCOUNTER — Telehealth: Payer: Self-pay

## 2021-12-06 NOTE — Telephone Encounter (Signed)
Patient said that she went to the provider [facility] Sayers in [place] for her referral but did not like that provider and is asking to get a referral to [facility]  for any provider. Seen Leisa for that first referral.

## 2021-12-06 NOTE — Telephone Encounter (Signed)
I am sorry I forgot to put she now has insurance ( I think Medicaid). Sorry

## 2021-12-07 ENCOUNTER — Telehealth: Payer: Self-pay | Admitting: Internal Medicine

## 2021-12-07 DIAGNOSIS — M25522 Pain in left elbow: Secondary | ICD-10-CM

## 2021-12-07 NOTE — Telephone Encounter (Signed)
New Ortho referral placed based on patient preference.  ?

## 2021-12-13 ENCOUNTER — Other Ambulatory Visit: Payer: Self-pay

## 2021-12-13 ENCOUNTER — Encounter: Payer: Self-pay | Admitting: Adult Health

## 2021-12-13 ENCOUNTER — Ambulatory Visit: Payer: Medicaid Other | Admitting: Adult Health

## 2021-12-13 VITALS — BP 130/78 | HR 114 | Temp 98.0°F | Ht 63.0 in | Wt 229.0 lb

## 2021-12-13 DIAGNOSIS — G4719 Other hypersomnia: Secondary | ICD-10-CM

## 2021-12-13 DIAGNOSIS — G4733 Obstructive sleep apnea (adult) (pediatric): Secondary | ICD-10-CM

## 2021-12-13 NOTE — Patient Instructions (Signed)
Order for new CPAP to DME .  ?Continue on CPAP At bedtime  and with naps.  ?Keep up great work  ?Do not drive if sleepy  ?Work on healthy weight loss.  ?Follow up with Dr. Halford Chessman  in 3 months and As needed   ?
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
## 2021-12-14 DIAGNOSIS — G4733 Obstructive sleep apnea (adult) (pediatric): Secondary | ICD-10-CM | POA: Insufficient documentation

## 2021-12-14 DIAGNOSIS — G4719 Other hypersomnia: Secondary | ICD-10-CM | POA: Insufficient documentation

## 2021-12-14 NOTE — Assessment & Plan Note (Signed)
-  Severe obstructive sleep apnea patient has excellent control and compliance on nocturnal CPAP.  She is continue on current CPAP and settings.  She does need a new CPAP machine order will be placed. ? ?Plan  ?Patient Instructions  ?Order for new CPAP to DME .  ?Continue on CPAP At bedtime  and with naps.  ?Keep up great work  ?Do not drive if sleepy  ?Work on healthy weight loss.  ?Follow up with Dr. Halford Chessman  in 3 months and As needed   ?  ? ?

## 2021-12-14 NOTE — Assessment & Plan Note (Signed)
Excessive daytime sleepiness despite excellent control and compliance on CPAP. ?Patient says she has been tested for narcolepsy in the past.  Could consider a multi sleep latency test however patient is unable to take stimulant like medicine due to tachycardia has been treated with sleep tried on Provigil and Ritalin and was unable to tolerate.  Patient education on healthy sleep regimen.  Decrease caffeine intake. ?Encouraged to take naps with her CPAP during the daytime.  And avoidance of driving due to her ongoing sleepiness.  Suspect that her medication regimen contributes to some of her daytime sleepiness as she is on multiple medications with sedating effects. ? ?Plan  ?Patient Instructions  ?Order for new CPAP to DME .  ?Continue on CPAP At bedtime  and with naps.  ?Keep up great work  ?Do not drive if sleepy  ?Work on healthy weight loss.  ?Follow up with Dr. Halford Chessman  in 3 months and As needed   ?  ? ?

## 2021-12-14 NOTE — Progress Notes (Signed)
Reviewed and agree with assessment/plan. ? ? ?Chesley Mires, MD ?[place] ?12/14/2021, 12:38 PM ?Pager:  [phone] ? ?

## 2021-12-25 ENCOUNTER — Telehealth: Payer: Self-pay

## 2021-12-25 ENCOUNTER — Encounter: Payer: Self-pay | Admitting: Physical Medicine and Rehabilitation

## 2021-12-25 ENCOUNTER — Ambulatory Visit (INDEPENDENT_AMBULATORY_CARE_PROVIDER_SITE_OTHER): Payer: Medicaid Other | Admitting: Obstetrics and Gynecology

## 2021-12-25 ENCOUNTER — Other Ambulatory Visit: Payer: Self-pay

## 2021-12-25 ENCOUNTER — Encounter: Payer: Self-pay | Admitting: Obstetrics and Gynecology

## 2021-12-25 VITALS — BP 125/78 | HR 91 | Ht 63.0 in | Wt 228.0 lb

## 2021-12-25 DIAGNOSIS — N393 Stress incontinence (female) (male): Secondary | ICD-10-CM

## 2021-12-25 DIAGNOSIS — M5410 Radiculopathy, site unspecified: Secondary | ICD-10-CM

## 2021-12-25 DIAGNOSIS — N6452 Nipple discharge: Secondary | ICD-10-CM | POA: Diagnosis not present

## 2021-12-25 DIAGNOSIS — R3915 Urgency of urination: Secondary | ICD-10-CM

## 2021-12-25 DIAGNOSIS — Z01419 Encounter for gynecological examination (general) (routine) without abnormal findings: Secondary | ICD-10-CM | POA: Diagnosis not present

## 2021-12-25 LAB — POCT URINALYSIS DIPSTICK
Bilirubin, UA: NEGATIVE
Glucose, UA: POSITIVE — AB
Ketones, UA: NEGATIVE
Leukocytes, UA: NEGATIVE
Nitrite, UA: NEGATIVE
Protein, UA: NEGATIVE
Spec Grav, UA: 1.015 (ref 1.010–1.025)
Urobilinogen, UA: 0.2 E.U./dL
pH, UA: 6.5 (ref 5.0–8.0)

## 2021-12-25 NOTE — Telephone Encounter (Signed)
Pt requesting that someone call and speed up her referral for back surgery with the orthopadeic. She does not want to see Dr Lorin Mercy in [place]. [facility] does not take her insurance.

## 2021-12-25 NOTE — Progress Notes (Signed)
HPI: ?     Dana Bradley is a 51 y.o. [phone] who LMP was Patient's last menstrual period was 10/22/2021 (approximate). ? ?Subjective:  ? ?She presents today for her annual examination.  She is complaining of severe radicular back pain.  She has tried physical therapy for over a year and multiple other things without success.  She says she is trying to get an appointment with an orthopedic back specialist for possible surgery.  She says she has trouble moving from one position to another and cannot sleep at night because of the pain. ?As far as GYN complaints she complains of urine loss with coughing laughing and sneezing.  She reports that this has become worse recently and is also experiencing pelvic pressure symptoms.  She thinks she may have a UTI. ?She continues to take progesterone for pelvic pain and reports that this is working.  She says she is amenorrheic with the 5 mg of Provera.  We tried 2.5 mg and she had painful menses and wanted to go back to 5 mg daily. ?She also reports that she is having right nipple discharge. (She says her last mammogram was negative) ? ?  Hx: ?The following portions of the patient's history were reviewed and updated as appropriate: ?            She  has a past medical history of Acute postoperative pain (04/07/2017), Anxiety, Bursitis, Chronic fatigue (12/12/2017), Chronic fatigue syndrome, Colitis (2021), Edema leg (05/02/2015), Fibromyalgia, GERD (gastroesophageal reflux disease), IBS (irritable bowel syndrome), Knee pain, bilateral (12/21/2008), Lumbar discitis, Migraines, Osteoarthritis, Right hand pain (04/10/2015), Sleep apnea, Spinal stenosis, SVT (supraventricular tachycardia) ([place]), Vertigo, and Vitamin D deficiency (05/01/2016). ?She does not have any pertinent problems on file. ?She  has a past surgical history that includes Ablation; Tubal ligation (10/01/1999); Cardiac catheterization; Knee arthroscopy; Colonoscopy with propofol (N/A, 05/17/2015);  Esophagogastroduodenoscopy (N/A, 05/17/2015); spg; Tibial Tubercle Bypass (Right, 1998); Colonoscopy with propofol (N/A, 03/20/2020); and Breast biopsy (Left). ?Her family history includes Alcohol abuse in her father; Alzheimer's disease in her paternal grandmother and another family member; Aneurysm in her maternal grandfather; Anxiety disorder in her father, mother, sister, and sister; Arthritis in her maternal grandmother; Bipolar disorder in her sister; Breast cancer (age of onset: 73) in her maternal grandmother; COPD in her paternal grandfather; Cancer in her mother; Cancer (age of onset: 31) in her maternal grandmother; Depression in her father, mother, sister, and sister; Diabetes in her sister; Heart attack in her paternal grandfather; Heart disease in her father, maternal grandfather, and paternal grandfather; Hyperlipidemia in her maternal grandmother, mother, and sister; Hypertension in her father, maternal grandfather, mother, paternal grandfather, sister, and sister; Migraines in her daughter, daughter, mother, sister, sister, and son; Other in her daughter; Polycystic ovary syndrome in her sister; Stroke in her father; Thyroid disease in her maternal grandmother. ?She  reports that she quit smoking about 29 years ago. Her smoking use included cigarettes. She has a 12.00 pack-year smoking history. She has never used smokeless tobacco. She reports that she does not drink alcohol and does not use drugs. ?She has a current medication list which includes the following prescription(s): amitriptyline, atorvastatin, bac, carbamazepine, pfizer covid-19 vac bivalent, cyanocobalamin, vitamin b-12, lipase/protease/amylase, lorazepam, meclizine, medroxyprogesterone, metoprolol tartrate, migranal, needles & syringes, omeprazole, ondansetron, pregabalin, nurtec, valerian, baclofen, diclofenac sodium, and dicyclomine. ?She is allergic to aspirin, cymbalta [duloxetine hcl], depakote [divalproex sodium], gadolinium  derivatives, haloperidol, meperidine, reglan [metoclopramide], tramadol hcl, trazodone, compazine [prochlorperazine], meloxicam, penicillins, tomato, other, shellfish  allergy, shellfish-derived products, bacitracin-neomycin-polymyxin, cephalosporins, ibuprofen, latex, neosporin [neomycin-bacitracin zn-polymyx], nsaids, sulfa antibiotics, and sulfonamide derivatives. ?      ?Review of Systems:  ?Review of Systems ? ?Constitutional: Denied constitutional symptoms, night sweats, recent illness, fatigue, fever, insomnia and weight loss.  ?Eyes: Denied eye symptoms, eye pain, photophobia, vision change and visual disturbance.  ?Ears/Nose/Throat/Neck: Denied ear, nose, throat or neck symptoms, hearing loss, nasal discharge, sinus congestion and sore throat.  ?Cardiovascular: Denied cardiovascular symptoms, arrhythmia, chest pain/pressure, edema, exercise intolerance, orthopnea and palpitations.  ?Respiratory: Denied pulmonary symptoms, asthma, pleuritic pain, productive sputum, cough, dyspnea and wheezing.  ?Gastrointestinal: Denied, gastro-esophageal reflux, melena, nausea and vomiting.  ?Genitourinary: See HPI for additional information.  ?Musculoskeletal: Denied musculoskeletal symptoms, stiffness, swelling, muscle weakness and myalgia.  ?Dermatologic: Denied dermatology symptoms, rash and scar.  ?Neurologic: Denied neurology symptoms, dizziness, headache, neck pain and syncope.  ?Psychiatric: Denied psychiatric symptoms, anxiety and depression.  ?Endocrine: Denied endocrine symptoms including hot flashes and night sweats.  ? ?Meds: ?  ?Current Outpatient Medications on File Prior to Visit  ?Medication Sig Dispense Refill  ? amitriptyline (ELAVIL) 50 MG tablet Take 1 tablet (50 mg total) by mouth at bedtime. 30 tablet 3  ? atorvastatin (LIPITOR) 40 MG tablet TAKE 1 TABLET (40 MG TOTAL) BY MOUTH AT BEDTIME. 90 tablet 3  ? BAC 50-325-40 MG tablet TAKE 1 TABLET BY MOUTH EVERY 8 HOURS AS NEEDED FOR HEADACHE. 40 tablet 3  ?  carbamazepine (TEGRETOL-XR) 100 MG 12 hr tablet Take 1 tablet (100 mg total) by mouth once daily at bedtime. 30 tablet 3  ? COVID-19 mRNA bivalent vaccine, Pfizer, (PFIZER COVID-19 VAC BIVALENT) injection Inject into the muscle. 0.3 mL 0  ? cyanocobalamin (,VITAMIN B-12,) 1000 MCG/ML injection Inject 1 mL (1,000 mcg total) into the muscle once every 30 (thirty) days. 1 mL 3  ? Cyanocobalamin (VITAMIN B-12) 500 MCG SUBL Place 1,500 mcg under the tongue daily. 150 tablet   ? lipase/protease/amylase (CREON) 12000-38000 units CPEP capsule Take 2 capsules by mouth with the first bite of each meal and take 1 capsule before snacks. (Max of 8 capsules per day). 270 capsule 1  ? LORazepam (ATIVAN) 1 MG tablet Take 0.5 tablets (0.5 mg total) by mouth 2 (two) times daily as needed for anxiety. 30 tablet 0  ? meclizine (ANTIVERT) 12.5 MG tablet Take 1 tablet (12.5 mg total) by mouth 3 (three) times daily as needed for dizziness. 30 tablet 0  ? medroxyPROGESTERone (PROVERA) 5 MG tablet Take 1 tablet (5 mg total) by mouth daily. 90 tablet 1  ? metoprolol tartrate (LOPRESSOR) 50 MG tablet Take 1 and (1/2) tablets (75 mg total) by mouth 2 (two) times daily. 135 tablet 3  ? MIGRANAL 4 MG/ML nasal spray Spray 1 spray into one nostril as needed for migraine as directed. No more than 4 sprays in one hour. (Patient taking differently: Place 1 spray into the nose as needed.) 8 mL 3  ? Needles & Syringes MISC For administration of B12 injections 30 each 0  ? omeprazole (PRILOSEC) 40 MG capsule Take 1 capsule (40 mg total) by mouth daily. 90 capsule 3  ? ondansetron (ZOFRAN) 4 MG tablet Take 1 tablet (4 mg total) by mouth every 8 (eight) hours as needed. 20 tablet 2  ? pregabalin (LYRICA) 150 MG capsule Take 1 capsule (150 mg total) by mouth every 8 (eight) hours. 270 capsule 3  ? Rimegepant Sulfate (NURTEC) 75 MG TBDP Take 1 tablet by mouth once every  other day. 30 tablet 3  ? VALERIAN PO Take by mouth as needed. Makes Valerian tea about  3-4 times per week.    ? baclofen (LIORESAL) 10 MG tablet TAKE ONE TABLET BY MOUTH 3 TIMES A DAY 90 tablet 2  ? diclofenac Sodium (VOLTAREN) 1 % GEL Apply 2 g topically 4 (four) times daily as needed. 350 g PRN  ? dicyclomine

## 2021-12-25 NOTE — Telephone Encounter (Signed)
Called pt advised there has not been a referral placed for her back pain. She will need an appointment for this referral. Pt understood and transferred her to [facility] to get her scheduled. ?

## 2021-12-25 NOTE — Progress Notes (Signed)
Patients presents for annual exam today. She  states she has been experiencing urinary urgency, incontinence and a vaginal pinching. Patient is up to date on pap smear, mammogram and colonoscopy. Patient states no other questions or concerns at this time.  ? ?

## 2021-12-26 ENCOUNTER — Encounter: Payer: Self-pay | Admitting: Family Medicine

## 2021-12-26 ENCOUNTER — Telehealth (INDEPENDENT_AMBULATORY_CARE_PROVIDER_SITE_OTHER): Payer: Medicaid Other | Admitting: Family Medicine

## 2021-12-26 ENCOUNTER — Encounter: Payer: Self-pay | Admitting: Obstetrics and Gynecology

## 2021-12-26 ENCOUNTER — Encounter: Payer: Self-pay | Admitting: *Deleted

## 2021-12-26 ENCOUNTER — Telehealth: Payer: Self-pay

## 2021-12-26 DIAGNOSIS — R937 Abnormal findings on diagnostic imaging of other parts of musculoskeletal system: Secondary | ICD-10-CM | POA: Diagnosis not present

## 2021-12-26 DIAGNOSIS — R29898 Other symptoms and signs involving the musculoskeletal system: Secondary | ICD-10-CM | POA: Diagnosis not present

## 2021-12-26 DIAGNOSIS — M5416 Radiculopathy, lumbar region: Secondary | ICD-10-CM | POA: Diagnosis not present

## 2021-12-26 DIAGNOSIS — M5137 Other intervertebral disc degeneration, lumbosacral region: Secondary | ICD-10-CM

## 2021-12-26 DIAGNOSIS — M5126 Other intervertebral disc displacement, lumbar region: Secondary | ICD-10-CM | POA: Diagnosis not present

## 2021-12-26 DIAGNOSIS — M47816 Spondylosis without myelopathy or radiculopathy, lumbar region: Secondary | ICD-10-CM | POA: Diagnosis not present

## 2021-12-26 DIAGNOSIS — R32 Unspecified urinary incontinence: Secondary | ICD-10-CM

## 2021-12-26 DIAGNOSIS — M5442 Lumbago with sciatica, left side: Secondary | ICD-10-CM

## 2021-12-26 DIAGNOSIS — G8929 Other chronic pain: Secondary | ICD-10-CM

## 2021-12-26 DIAGNOSIS — R200 Anesthesia of skin: Secondary | ICD-10-CM | POA: Diagnosis not present

## 2021-12-26 DIAGNOSIS — M51379 Other intervertebral disc degeneration, lumbosacral region without mention of lumbar back pain or lower extremity pain: Secondary | ICD-10-CM

## 2021-12-26 DIAGNOSIS — M4807 Spinal stenosis, lumbosacral region: Secondary | ICD-10-CM | POA: Diagnosis not present

## 2021-12-26 DIAGNOSIS — R269 Unspecified abnormalities of gait and mobility: Secondary | ICD-10-CM | POA: Diagnosis not present

## 2021-12-26 DIAGNOSIS — M5441 Lumbago with sciatica, right side: Secondary | ICD-10-CM | POA: Diagnosis not present

## 2021-12-26 MED ORDER — METHYLPREDNISOLONE 4 MG PO TBPK: ORAL_TABLET | ORAL | 0 refills | Status: DC

## 2021-12-26 NOTE — Telephone Encounter (Signed)
Opened in error

## 2021-12-26 NOTE — Patient Instructions (Signed)
Red flags - Get help right away if: ?You are not able to control when you urinate or have bowel movements (incontinence). ?You have: ?Weakness in your lower back, pelvis, buttocks, or legs that gets worse. ?Redness or swelling of your back. ? ? ?Sciatica ?Sciatica is pain, numbness, weakness, or tingling along the path of the sciatic nerve. The sciatic nerve starts in the lower back and runs down the back of each leg. The nerve controls the muscles in the lower leg and in the back of the knee. It also provides feeling (sensation) to the back of the thigh, the lower leg, and the sole of the foot. Sciatica is a symptom of another medical condition that pinches or puts pressure on the sciatic nerve. ?Sciatica most often only affects one side of the body. Sciatica usually goes away on its own or with treatment. In some cases, sciatica may come back (recur). ?What are the causes? ?This condition is caused by pressure on the sciatic nerve or pinching of the nerve. This may be the result of: ?A disk in between the bones of the spine bulging out too far (herniated disk). ?Age-related changes in the spinal disks. ?A pain disorder that affects a muscle in the buttock. ?Extra bone growth near the sciatic nerve. ?A break (fracture) of the pelvis. ?Pregnancy. ?Tumor. This is rare. ?What increases the risk? ?The following factors may make you more likely to develop this condition: ?Playing sports that place pressure or stress on the spine. ?Having poor strength and flexibility. ?A history of back injury or surgery. ?Sitting for long periods of time. ?Doing activities that involve repetitive bending or lifting. ?Obesity. ?What are the signs or symptoms? ?Symptoms can vary from mild to very severe, and they may include: ?Any of these problems in the lower back, leg, hip, or buttock: ?Mild tingling, numbness, or dull aches. ?Burning sensations. ?Sharp pains. ?Numbness in the back of the calf or the sole of the foot. ?Leg  weakness. ?Severe back pain that makes movement difficult. ?Symptoms may get worse when you cough, sneeze, or laugh, or when you sit or stand for long periods of time. ?How is this diagnosed? ?This condition may be diagnosed based on: ?Your symptoms and medical history. ?A physical exam. ?Blood tests. ?Imaging tests, such as: ?X-rays. ?MRI. ?CT scan. ?How is this treated? ?In many cases, this condition improves on its own without treatment. However, treatment may include: ?Reducing or modifying physical activity. ?Exercising and stretching. ?Icing and applying heat to the affected area. ?Medicines that help to: ?Relieve pain and swelling. ?Relax your muscles. ?Injections of medicines that help to relieve pain, irritation, and inflammation around the sciatic nerve (steroids). ?Surgery. ?Follow these instructions at home: ?Medicines ?Take over-the-counter and prescription medicines only as told by your health care provider. ?Ask your health care provider if the medicine prescribed to you: ?Requires you to avoid driving or using heavy machinery. ?Can cause constipation. You may need to take these actions to prevent or treat constipation: ?Drink enough fluid to keep your urine pale yellow. ?Take over-the-counter or prescription medicines. ?Eat foods that are high in fiber, such as beans, whole grains, and fresh fruits and vegetables. ?Limit foods that are high in fat and processed sugars, such as fried or sweet foods. ?Managing pain ?  ?If directed, put ice on the affected area. ?Put ice in a plastic bag. ?Place a towel between your skin and the bag. ?Leave the ice on for 20 minutes, 2-3 times a day. ?If directed,  apply heat to the affected area. Use the heat source that your health care provider recommends, such as a moist heat pack or a heating pad. ?Place a towel between your skin and the heat source. ?Leave the heat on for 20-30 minutes. ?Remove the heat if your skin turns bright red. This is especially important if  you are unable to feel pain, heat, or cold. You may have a greater risk of getting burned. ?Activity ? ?Return to your normal activities as told by your health care provider. Ask your health care provider what activities are safe for you. ?Avoid activities that make your symptoms worse. ?Take brief periods of rest throughout the day. ?When you rest for longer periods, mix in some mild activity or stretching between periods of rest. This will help to prevent stiffness and pain. ?Avoid sitting for long periods of time without moving. Get up and move around at least one time each hour. ?Exercise and stretch regularly, as told by your health care provider. ?Do not lift anything that is heavier than 10 lb (4.5 kg) while you have symptoms of sciatica. When you do not have symptoms, you should still avoid heavy lifting, especially repetitive heavy lifting. ?When you lift objects, always use proper lifting technique, which includes: ?Bending your knees. ?Keeping the load close to your body. ?Avoiding twisting. ?General instructions ?Maintain a healthy weight. Excess weight puts extra stress on your back. ?Wear supportive, comfortable shoes. Avoid wearing high heels. ?Avoid sleeping on a mattress that is too soft or too hard. A mattress that is firm enough to support your back when you sleep may help to reduce your pain. ?Keep all follow-up visits as told by your health care provider. This is important. ?Contact a health care provider if: ?You have pain that: ?Wakes you up when you are sleeping. ?Gets worse when you lie down. ?Is worse than you have experienced in the past. ?Lasts longer than 4 weeks. ?You have an unexplained weight loss. ?Get help right away if: ?You are not able to control when you urinate or have bowel movements (incontinence). ?You have: ?Weakness in your lower back, pelvis, buttocks, or legs that gets worse. ?Redness or swelling of your back. ?A burning sensation when you urinate. ?Summary ?Sciatica is  pain, numbness, weakness, or tingling along the path of the sciatic nerve. ?This condition is caused by pressure on the sciatic nerve or pinching of the nerve. ?Sciatica can cause pain, numbness, or tingling in the lower back, legs, hips, and buttocks. ?Treatment often includes rest, exercise, medicines, and applying ice or heat. ?This information is not intended to replace advice given to you by your health care provider. Make sure you discuss any questions you have with your health care provider. ?Document Revised: 10/05/2018 Document Reviewed: 10/05/2018 ?Elsevier Patient Education ? [place]. ? ?

## 2021-12-26 NOTE — Progress Notes (Signed)
? ?Name: Dana Bradley   MRN: [id number]    DOB: [birth year]   Date:12/26/2021 ? ?     Progress Note ? ?Subjective:  ? ? ?Chief Complaint ? ?Chief Complaint  ?Patient presents with  ? Back Pain  ?  On going since 2000, pt has done shots/PT it has not helped. Would like a referral for Orthopedics surgeon with [facility].  ? ? ?I connected with  Dana Bradley  on 12/26/21 at  1:20 PM EDT by a video enabled telemedicine application and verified that I am speaking with the correct person using two identifiers.  I discussed the limitations of evaluation and management by telemedicine and the availability of in person appointments. The patient expressed understanding and agreed to proceed. Staff also discussed with the patient that there may be a patient responsible charge related to this service. ?Patient Location: home ?Provider Location: home office ?Additional Individuals present: none ? ?Back Pain ?Pertinent negatives include no fever.  ? ?pt presents for chronic back pain, after seeing multiple specialists - PM&R, pain management, Pt, ortho, failed ESI/PT, maxed out on meds ?She does not like narcotics and is not currently seeing pain specilaists but using more narcotic pain meds than normal since back pain increasingly worse x 3 months ?Starts in mid back, radiates wider in low back, radiates through pelvisbuttock and to leg all the way to feet, R>L ?She has weakness, spasms/twitches, numbness, using walkers and canes now, almost all positions are painful except some alleviation of pain in recliner. ?She has some chronic urge and stress incontinence that she cannot say if its changed much ?She denies incontinence of stool, saddle anesthesia ?PM&R in [place] managing meds - did injections ?Dr. Dossie Bradley was previously managing painmeds/narcotics, but changed to PM&R ?She previously did PT with a PT who was actually piloting and working on a chronic pain management program ? ?She denies any falls or trauma ?Most of last year was  spent focusing on her young daughters cancer diagnosis - no injections or big procedures last year ?Lumbar MRIs from 2020 ad 2021 reviewed without significant changes  ? ? ? ? ? ?Patient Active Problem List  ? Diagnosis Date Noted  ? OSA (obstructive sleep apnea) 12/14/2021  ? Excessive daytime sleepiness 12/14/2021  ? Lateral epicondylitis, left elbow 07/19/2021  ? Abnormal MRI, cervical spine (02/14/2021) 02/28/2021  ? Cervicalgia 02/13/2021  ? Abnormal MRI, lumbar spine (03/16/2020) 11/09/2020  ? Chronic midline thoracic back pain 11/09/2020  ? Abnormal MRI, thoracic spine (08/02/2019) 11/09/2020  ? Thoracic spinal stenosis (T7-8) 11/09/2020  ? Prolapse of thoracic disc with radiculopathy (T7-8) 11/09/2020  ? Spasm of muscle of lower back 11/09/2020  ? Vertigo, benign paroxysmal, unspecified laterality 11/09/2020  ? Uncomplicated opioid dependence ([place]) 11/08/2020  ? Hyperalgesia 03/07/2020  ? Neurogenic urinary incontinence 03/01/2020  ? Other intervertebral disc degeneration, lumbar region 03/01/2020  ? Spinal stenosis of lumbosacral region 03/01/2020  ? Pharmacologic therapy 02/06/2020  ? Lumbar radiculitis (Right) 09/21/2019  ? Chronic lower extremity pain (Bilateral) 09/06/2019  ? Intractable migraine with aura without status migrainosus 08/10/2019  ? Other specified dorsopathies, sacral and sacrococcygeal region 08/03/2019  ? Latex precautions, history of latex allergy 08/03/2019  ? History of allergy to radiographic contrast media 08/03/2019  ? DDD (degenerative disc disease), cervical 07/21/2019  ? Cervical facet syndrome (Bilateral) (L>R) 07/21/2019  ? DDD (degenerative disc disease), thoracic 07/21/2019  ? Osteoarthritis of hip (Left) 07/21/2019  ? Chronic groin pain (Bilateral) (L>R) 07/21/2019  ? Chronic hip pain (  Bilateral) (L>R) 07/21/2019  ? Somatic dysfunction of sacroiliac joint (Bilateral) 07/21/2019  ? Migraine with aura and with status migrainosus, not intractable 04/06/2019  ?  Cervico-occipital neuralgia (Left) 04/06/2019  ? Weakness of leg (Left) 04/05/2019  ? Difficulty walking 04/05/2019  ? Chronic migraine without aura, with intractable migraine, so stated, with status migrainosus 12/27/2018  ? Malar rash 09/04/2018  ? Trigger point of neck (Left) 03/19/2018  ? Occipital headache 12/25/2017  ? Chronic fatigue syndrome with fibromyalgia 12/12/2017  ? Trigger point of shoulder region (Left) 11/17/2017  ? Myofascial pain syndrome (Left) (trapezius muscle) 07/22/2017  ? Lumbar L1-2 disc protrusion (Right) 04/07/2017  ? Muscle spasticity 04/01/2017  ? Osteoarthritis of shoulder (Bilateral) 04/01/2017  ? Lumbar spondylosis 01/06/2017  ? Chronic hip pain (Left) 12/24/2016  ? Chronic sacroiliac joint pain (Left) 12/24/2016  ? Lumbar facet syndrome (Bilateral) (L>R) 12/24/2016  ? Lumbar radiculitis (Left) 12/24/2016  ? Hypertriglyceridemia 11/27/2016  ? History of vasovagal episode 10/30/2016  ? Cervicogenic headache 09/09/2016  ? Medication monitoring encounter 08/29/2016  ? Controlled substance agreement signed 08/28/2016  ? Plantar fasciitis of left foot 08/28/2016  ? Vitamin B12 deficiency 08/28/2016  ? Hyperlipidemia 08/28/2016  ? Nephrolithiasis 08/12/2016  ? Chronic pain syndrome 08/07/2016  ? Long term prescription opiate use 08/07/2016  ? Opiate use 08/07/2016  ? Long term prescription benzodiazepine use 08/07/2016  ? Neurogenic pain 08/07/2016  ? Chronic low back pain (1ry area of Pain) (Bilateral) (R>L) (midline) 08/07/2016  ? Chronic upper back pain (2ry area of Pain) (Bilateral) (L>R) 08/07/2016  ? Chronic abdominal pain (Right lower quadrant) 08/07/2016  ? Thoracic radiculitis (Bilateral: T10, T11) 08/07/2016  ? Chronic occipital neuralgia (3ry area of Pain) (Bilateral) (L>R) 08/07/2016  ? Chronic neck pain 08/07/2016  ? Chronic cervical radicular pain (Bilateral) (L>R) 08/07/2016  ? Chronic shoulder blade pain (Bilateral) (L>R) 08/07/2016  ? Chronic upper extremity pain  (Bilateral) (R>L) 08/07/2016  ? Chronic knee pain (Bilateral) (R>L) 08/07/2016  ? Chronic ankle pain (Bilateral) 08/07/2016  ? Cervical spondylosis with myelopathy and radiculopathy 08/07/2016  ? Panic disorder with agoraphobia 05/29/2016  ? Depression, unspecified depression type 05/29/2016  ? Atypical lymphocytosis 05/01/2016  ? Vitamin D insufficiency 05/01/2016  ? Chronic lower extremity cramps (Bilateral) (R>L) 04/29/2016  ? Obesity 04/29/2016  ? GAD (generalized anxiety disorder) 04/29/2016  ? Fatigue 04/29/2016  ? Insomnia 07/12/2015  ? Migraine without aura and with status migrainosus, not intractable 07/12/2015  ? Chronic superficial gastritis 06/02/2015  ? Chronic pain of multiple joints 05/15/2015  ? Bilateral leg edema 05/02/2015  ? Paroxysmal supraventricular tachycardia ([place]) 04/17/2015  ? Exertional shortness of breath 04/17/2015  ? Bright red rectal bleeding 04/06/2015  ? DDD (degenerative disc disease), lumbosacral 01/24/2014  ? DDD (degenerative disc disease), lumbar 01/24/2014  ? Cervico-occipital neuralgia 12/29/2013  ? Fibromyalgia 12/29/2013  ? Migraine headache 12/29/2013  ? Menorrhagia 12/10/2012  ? Depression, major, recurrent, in remission ([place]) 01/12/2009  ? Chest pain 01/12/2009  ? Hypertension, benign essential, goal below 140/90 06/23/2008  ? History of PSVT (paroxysmal supraventricular tachycardia) 06/17/2008  ? Obstructive sleep apnea 06/17/2008  ? GERD 06/13/2008  ? ? ?Social History  ? ?Tobacco Use  ? Smoking status: Former  ?  Packs/day: 4.00  ?  Years: 3.00  ?  Pack years: 12.00  ?  Types: Cigarettes  ?  Quit date: 12/10/1992  ?  Years since quitting: 29.0  ? Smokeless tobacco: Never  ? Tobacco comments:  ?  quit 25 years ago  ?  Substance Use Topics  ? Alcohol use: No  ?  Comment: socially  ? ? ? ?Current Outpatient Medications:  ?  amitriptyline (ELAVIL) 50 MG tablet, Take 1 tablet (50 mg total) by mouth at bedtime., Disp: 30 tablet, Rfl: 3 ?  atorvastatin (LIPITOR) 40 MG tablet,  TAKE 1 TABLET (40 MG TOTAL) BY MOUTH AT BEDTIME., Disp: 90 tablet, Rfl: 3 ?  BAC 50-325-40 MG tablet, TAKE 1 TABLET BY MOUTH EVERY 8 HOURS AS NEEDED FOR HEADACHE., Disp: 40 tablet, Rfl: 3 ?  carbamazepine (TEGRETOL-XR) 100 MG 12

## 2021-12-26 NOTE — Telephone Encounter (Signed)
I attempted to do the prior authorization through [facility] for the [facility] and it stated it is available with authorization. I am not sure what [employer] is telling the patient ?

## 2021-12-27 ENCOUNTER — Other Ambulatory Visit: Payer: Medicaid Other

## 2021-12-27 ENCOUNTER — Ambulatory Visit
Admission: RE | Admit: 2021-12-27 | Discharge: 2021-12-27 | Disposition: A | Discharge status: Home / Self Care | Payer: Medicaid Other | Source: Ambulatory Visit | Attending: Family Medicine | Admitting: Family Medicine

## 2021-12-27 ENCOUNTER — Other Ambulatory Visit: Payer: Self-pay | Admitting: Physical Medicine and Rehabilitation

## 2021-12-27 ENCOUNTER — Telehealth: Payer: Self-pay | Admitting: Obstetrics and Gynecology

## 2021-12-27 DIAGNOSIS — N6452 Nipple discharge: Secondary | ICD-10-CM | POA: Diagnosis not present

## 2021-12-27 DIAGNOSIS — R937 Abnormal findings on diagnostic imaging of other parts of musculoskeletal system: Secondary | ICD-10-CM | POA: Insufficient documentation

## 2021-12-27 DIAGNOSIS — R200 Anesthesia of skin: Secondary | ICD-10-CM | POA: Insufficient documentation

## 2021-12-27 DIAGNOSIS — M5441 Lumbago with sciatica, right side: Secondary | ICD-10-CM | POA: Diagnosis present

## 2021-12-27 DIAGNOSIS — M5442 Lumbago with sciatica, left side: Secondary | ICD-10-CM | POA: Insufficient documentation

## 2021-12-27 DIAGNOSIS — R29898 Other symptoms and signs involving the musculoskeletal system: Secondary | ICD-10-CM | POA: Diagnosis present

## 2021-12-27 DIAGNOSIS — M47816 Spondylosis without myelopathy or radiculopathy, lumbar region: Secondary | ICD-10-CM | POA: Diagnosis present

## 2021-12-27 DIAGNOSIS — M5115 Intervertebral disc disorders with radiculopathy, thoracolumbar region: Secondary | ICD-10-CM | POA: Diagnosis not present

## 2021-12-27 DIAGNOSIS — G8929 Other chronic pain: Secondary | ICD-10-CM | POA: Diagnosis present

## 2021-12-27 DIAGNOSIS — R269 Unspecified abnormalities of gait and mobility: Secondary | ICD-10-CM | POA: Diagnosis present

## 2021-12-27 DIAGNOSIS — M5116 Intervertebral disc disorders with radiculopathy, lumbar region: Secondary | ICD-10-CM | POA: Diagnosis not present

## 2021-12-27 DIAGNOSIS — M5416 Radiculopathy, lumbar region: Secondary | ICD-10-CM | POA: Insufficient documentation

## 2021-12-27 DIAGNOSIS — M5126 Other intervertebral disc displacement, lumbar region: Secondary | ICD-10-CM | POA: Diagnosis present

## 2021-12-27 DIAGNOSIS — M4807 Spinal stenosis, lumbosacral region: Secondary | ICD-10-CM | POA: Insufficient documentation

## 2021-12-27 DIAGNOSIS — M5137 Other intervertebral disc degeneration, lumbosacral region: Secondary | ICD-10-CM | POA: Diagnosis present

## 2021-12-27 DIAGNOSIS — Z01419 Encounter for gynecological examination (general) (routine) without abnormal findings: Secondary | ICD-10-CM | POA: Diagnosis not present

## 2021-12-27 NOTE — Telephone Encounter (Signed)
Patient aware. See previous MyChart message ?

## 2021-12-27 NOTE — Telephone Encounter (Signed)
Is requesting a call once the RX has been sent to the pharmacy ?

## 2021-12-27 NOTE — Telephone Encounter (Signed)
Advised via mychart

## 2021-12-28 ENCOUNTER — Encounter: Payer: Self-pay | Admitting: Family Medicine

## 2021-12-28 LAB — CBC
Hematocrit: 42 % (ref 34.0–46.6)
Hemoglobin: 14.2 g/dL (ref 11.1–15.9)
MCH: 31.7 pg (ref 26.6–33.0)
MCHC: 33.8 g/dL (ref 31.5–35.7)
MCV: 94 fL (ref 79–97)
Platelets: 251 10*3/uL (ref 150–450)
RBC: 4.48 x10E6/uL (ref 3.77–5.28)
RDW: 12.4 % (ref 11.7–15.4)
WBC: 8.4 10*3/uL (ref 3.4–10.8)

## 2021-12-28 LAB — HEMOGLOBIN A1C
Est. average glucose Bld gHb Est-mCnc: 169 mg/dL
Hgb A1c MFr Bld: 7.5 % — ABNORMAL HIGH (ref 4.8–5.6)

## 2021-12-28 LAB — BASIC METABOLIC PANEL
BUN/Creatinine Ratio: 13 (ref 9–23)
BUN: 9 mg/dL (ref 6–24)
CO2: 23 mmol/L (ref 20–29)
Calcium: 9.7 mg/dL (ref 8.7–10.2)
Chloride: 103 mmol/L (ref 96–106)
Creatinine, Ser: 0.68 mg/dL (ref 0.57–1.00)
Glucose: 154 mg/dL — ABNORMAL HIGH (ref 70–99)
Potassium: 4.2 mmol/L (ref 3.5–5.2)
Sodium: 140 mmol/L (ref 134–144)
eGFR: 106 mL/min/{1.73_m2} (ref >59)

## 2021-12-28 LAB — LIPID PANEL
Chol/HDL Ratio: 5.4 ratio — ABNORMAL HIGH (ref 0.0–4.4)
Cholesterol, Total: 207 mg/dL — ABNORMAL HIGH (ref 100–199)
HDL: 38 mg/dL — ABNORMAL LOW (ref >39)
LDL Chol Calc (NIH): 105 mg/dL — ABNORMAL HIGH (ref 0–99)
Triglycerides: 378 mg/dL — ABNORMAL HIGH (ref 0–149)
VLDL Cholesterol Cal: 64 mg/dL — ABNORMAL HIGH (ref 5–40)

## 2021-12-28 LAB — FOLLICLE STIMULATING HORMONE: FSH: 14.9 m[IU]/mL

## 2021-12-28 LAB — TSH: TSH: 2.24 u[IU]/mL (ref 0.450–4.500)

## 2021-12-28 LAB — PROLACTIN: Prolactin: 18.4 ng/mL (ref 4.8–23.3)

## 2022-01-02 ENCOUNTER — Encounter: Payer: Self-pay | Admitting: Family Medicine

## 2022-01-02 ENCOUNTER — Ambulatory Visit (INDEPENDENT_AMBULATORY_CARE_PROVIDER_SITE_OTHER): Payer: Medicaid Other | Admitting: Family Medicine

## 2022-01-02 VITALS — BP 106/64 | HR 99 | Temp 98.7°F | Resp 16 | Ht 63.0 in | Wt 226.8 lb

## 2022-01-02 DIAGNOSIS — G8929 Other chronic pain: Secondary | ICD-10-CM | POA: Diagnosis not present

## 2022-01-02 DIAGNOSIS — M5442 Lumbago with sciatica, left side: Secondary | ICD-10-CM

## 2022-01-02 DIAGNOSIS — M5441 Lumbago with sciatica, right side: Secondary | ICD-10-CM

## 2022-01-02 DIAGNOSIS — R300 Dysuria: Secondary | ICD-10-CM

## 2022-01-02 DIAGNOSIS — I10 Essential (primary) hypertension: Secondary | ICD-10-CM

## 2022-01-02 DIAGNOSIS — E119 Type 2 diabetes mellitus without complications: Secondary | ICD-10-CM | POA: Diagnosis not present

## 2022-01-02 DIAGNOSIS — F411 Generalized anxiety disorder: Secondary | ICD-10-CM

## 2022-01-02 DIAGNOSIS — R350 Frequency of micturition: Secondary | ICD-10-CM

## 2022-01-02 DIAGNOSIS — E782 Mixed hyperlipidemia: Secondary | ICD-10-CM

## 2022-01-02 DIAGNOSIS — F3340 Major depressive disorder, recurrent, in remission, unspecified: Secondary | ICD-10-CM

## 2022-01-02 DIAGNOSIS — M5416 Radiculopathy, lumbar region: Secondary | ICD-10-CM | POA: Diagnosis not present

## 2022-01-02 LAB — POCT URINALYSIS DIPSTICK
Bilirubin, UA: NEGATIVE
Glucose, UA: POSITIVE — AB
Ketones, UA: NEGATIVE
Nitrite, UA: POSITIVE
Protein, UA: NEGATIVE
Spec Grav, UA: 1.01 (ref 1.010–1.025)
Urobilinogen, UA: 0.2 E.U./dL
pH, UA: 5 (ref 5.0–8.0)

## 2022-01-02 MED ORDER — LEVOFLOXACIN 500 MG PO TABS: 500.0000 mg | ORAL_TABLET | Freq: Every day | ORAL | 0 refills | Status: AC

## 2022-01-02 MED ORDER — DEXAMETHASONE SODIUM PHOSPHATE 10 MG/ML IJ SOLN
10.0000 mg | Freq: Once | INTRAMUSCULAR | Status: AC
  Administered 2022-01-02: 10 mg via INTRAMUSCULAR

## 2022-01-02 MED ORDER — METFORMIN HCL 500 MG PO TABS: 500.0000 mg | ORAL_TABLET | Freq: Two times a day (BID) | ORAL | 3 refills | Status: DC

## 2022-01-02 MED ORDER — KETOROLAC TROMETHAMINE 60 MG/2ML IM SOLN
60.0000 mg | Freq: Once | INTRAMUSCULAR | Status: AC
  Administered 2022-01-02: 30 mg via INTRAMUSCULAR

## 2022-01-02 NOTE — Progress Notes (Signed)
? ? ?Patient ID: Dana Bradley, female    DOB: 1971/08/03, 51 y.o.   MRN: [id number] ? ?PCP: Delsa Grana, PA-C ? ?Chief Complaint  ?Patient presents with  ? Follow-up  ? Back Pain  ?  Pt states it has got better with steroids, but still continues taking pain medicine.  ? Urinary Frequency  ? Dysuria  ?  Onset for a month  ? ? ?Subjective:  ? ?Dana Bradley is a 51 y.o. female, presents to clinic with CC of the following: ? ?HPI  ? ? ?New onset diabetes - A1C 7.5 ?Checks blood sugar 130-140  ?Not hungry in the morning, tries to eat healthy during the day ?Diet/lifestyle/weight has changed a lot over the past year with her daughters cancer  ? ?Back pain - improved a little on the steroids but then got worse when off steroids ?On all her meds ?She heard from neurosurgery that they require her to do PT/injections again prior to seeing them ?She is going back to pain management for that ?Still taking pain meds more than she wants to  ? ? ?Dysuria and urinary frequency/urgency still bad, GYN did not call her with results/culture/abx? ?Wants to be retested, she can't tell if back pain is her back or kidney's  ?No N/V fever ?Results for orders placed or performed in visit on 01/02/22  ?POCT Urinalysis Dipstick  ?Result Value Ref Range  ? Color, UA Yellow   ? Clarity, UA Cloudy   ? Glucose, UA Positive (A) Negative  ? Bilirubin, UA Negative   ? Ketones, UA Negative   ? Spec Grav, UA 1.010 1.010 - 1.025  ? Blood, UA Trace   ? pH, UA 5.0 5.0 - 8.0  ? Protein, UA Negative Negative  ? Urobilinogen, UA 0.2 0.2 or 1.0 E.U./dL  ? Nitrite, UA Positive   ? Leukocytes, UA Trace (A) Negative  ? Appearance Yellow   ? Odor Foul   ? ? ?NSAIDs - she can take only for a few days - gets mouth sores/ulcers if takes too frequently ? ?Several abx allergies - including keflex, penicillins, bactrim ?Can tolerate cipro and doxy or zpaks ? ? ? ? ? ?Patient Active Problem List  ? Diagnosis Date Noted  ? OSA (obstructive sleep apnea) 12/14/2021  ?  Excessive daytime sleepiness 12/14/2021  ? Lateral epicondylitis, left elbow 07/19/2021  ? Abnormal MRI, cervical spine (02/14/2021) 02/28/2021  ? Cervicalgia 02/13/2021  ? Abnormal MRI, lumbar spine (03/16/2020) 11/09/2020  ? Chronic midline thoracic back pain 11/09/2020  ? Abnormal MRI, thoracic spine (08/02/2019) 11/09/2020  ? Thoracic spinal stenosis (T7-8) 11/09/2020  ? Prolapse of thoracic disc with radiculopathy (T7-8) 11/09/2020  ? Spasm of muscle of lower back 11/09/2020  ? Vertigo, benign paroxysmal, unspecified laterality 11/09/2020  ? Uncomplicated opioid dependence ([place]) 11/08/2020  ? Hyperalgesia 03/07/2020  ? Neurogenic urinary incontinence 03/01/2020  ? Other intervertebral disc degeneration, lumbar region 03/01/2020  ? Spinal stenosis of lumbosacral region 03/01/2020  ? Pharmacologic therapy 02/06/2020  ? Lumbar radiculitis (Right) 09/21/2019  ? Chronic lower extremity pain (Bilateral) 09/06/2019  ? Intractable migraine with aura without status migrainosus 08/10/2019  ? Other specified dorsopathies, sacral and sacrococcygeal region 08/03/2019  ? Latex precautions, history of latex allergy 08/03/2019  ? History of allergy to radiographic contrast media 08/03/2019  ? DDD (degenerative disc disease), cervical 07/21/2019  ? Cervical facet syndrome (Bilateral) (L>R) 07/21/2019  ? DDD (degenerative disc disease), thoracic 07/21/2019  ? Osteoarthritis of hip (Left) 07/21/2019  ? Chronic  groin pain (Bilateral) (L>R) 07/21/2019  ? Chronic hip pain (Bilateral) (L>R) 07/21/2019  ? Somatic dysfunction of sacroiliac joint (Bilateral) 07/21/2019  ? Migraine with aura and with status migrainosus, not intractable 04/06/2019  ? Cervico-occipital neuralgia (Left) 04/06/2019  ? Weakness of leg (Left) 04/05/2019  ? Difficulty walking 04/05/2019  ? Chronic migraine without aura, with intractable migraine, so stated, with status migrainosus 12/27/2018  ? Malar rash 09/04/2018  ? Trigger point of neck (Left) 03/19/2018  ?  Occipital headache 12/25/2017  ? Chronic fatigue syndrome with fibromyalgia 12/12/2017  ? Trigger point of shoulder region (Left) 11/17/2017  ? Myofascial pain syndrome (Left) (trapezius muscle) 07/22/2017  ? Lumbar L1-2 disc protrusion (Right) 04/07/2017  ? Muscle spasticity 04/01/2017  ? Osteoarthritis of shoulder (Bilateral) 04/01/2017  ? Lumbar spondylosis 01/06/2017  ? Chronic hip pain (Left) 12/24/2016  ? Chronic sacroiliac joint pain (Left) 12/24/2016  ? Lumbar facet syndrome (Bilateral) (L>R) 12/24/2016  ? Lumbar radiculitis (Left) 12/24/2016  ? Hypertriglyceridemia 11/27/2016  ? History of vasovagal episode 10/30/2016  ? Cervicogenic headache 09/09/2016  ? Medication monitoring encounter 08/29/2016  ? Controlled substance agreement signed 08/28/2016  ? Plantar fasciitis of left foot 08/28/2016  ? Vitamin B12 deficiency 08/28/2016  ? Hyperlipidemia 08/28/2016  ? Nephrolithiasis 08/12/2016  ? Chronic pain syndrome 08/07/2016  ? Long term prescription opiate use 08/07/2016  ? Opiate use 08/07/2016  ? Long term prescription benzodiazepine use 08/07/2016  ? Neurogenic pain 08/07/2016  ? Chronic low back pain (1ry area of Pain) (Bilateral) (R>L) (midline) 08/07/2016  ? Chronic upper back pain (2ry area of Pain) (Bilateral) (L>R) 08/07/2016  ? Chronic abdominal pain (Right lower quadrant) 08/07/2016  ? Thoracic radiculitis (Bilateral: T10, T11) 08/07/2016  ? Chronic occipital neuralgia (3ry area of Pain) (Bilateral) (L>R) 08/07/2016  ? Chronic neck pain 08/07/2016  ? Chronic cervical radicular pain (Bilateral) (L>R) 08/07/2016  ? Chronic shoulder blade pain (Bilateral) (L>R) 08/07/2016  ? Chronic upper extremity pain (Bilateral) (R>L) 08/07/2016  ? Chronic knee pain (Bilateral) (R>L) 08/07/2016  ? Chronic ankle pain (Bilateral) 08/07/2016  ? Cervical spondylosis with myelopathy and radiculopathy 08/07/2016  ? Panic disorder with agoraphobia 05/29/2016  ? Depression, unspecified depression type 05/29/2016  ?  Atypical lymphocytosis 05/01/2016  ? Vitamin D insufficiency 05/01/2016  ? Chronic lower extremity cramps (Bilateral) (R>L) 04/29/2016  ? Obesity 04/29/2016  ? GAD (generalized anxiety disorder) 04/29/2016  ? Fatigue 04/29/2016  ? Insomnia 07/12/2015  ? Migraine without aura and with status migrainosus, not intractable 07/12/2015  ? Chronic superficial gastritis 06/02/2015  ? Chronic pain of multiple joints 05/15/2015  ? Bilateral leg edema 05/02/2015  ? Paroxysmal supraventricular tachycardia ([place]) 04/17/2015  ? Exertional shortness of breath 04/17/2015  ? Bright red rectal bleeding 04/06/2015  ? DDD (degenerative disc disease), lumbosacral 01/24/2014  ? DDD (degenerative disc disease), lumbar 01/24/2014  ? Cervico-occipital neuralgia 12/29/2013  ? Fibromyalgia 12/29/2013  ? Migraine headache 12/29/2013  ? Menorrhagia 12/10/2012  ? Depression, major, recurrent, in remission ([place]) 01/12/2009  ? Chest pain 01/12/2009  ? Hypertension, benign essential, goal below 140/90 06/23/2008  ? History of PSVT (paroxysmal supraventricular tachycardia) 06/17/2008  ? Obstructive sleep apnea 06/17/2008  ? GERD 06/13/2008  ? ? ? ? ?Current Outpatient Medications:  ?  amitriptyline (ELAVIL) 50 MG tablet, Take 1 tablet (50 mg total) by mouth at bedtime., Disp: 30 tablet, Rfl: 3 ?  atorvastatin (LIPITOR) 40 MG tablet, TAKE 1 TABLET (40 MG TOTAL) BY MOUTH AT BEDTIME., Disp: 90 tablet, Rfl: 3 ?  BAC 50-325-40 MG tablet, TAKE 1 TABLET BY MOUTH EVERY 8 HOURS AS NEEDED FOR HEADACHE., Disp: 40 tablet, Rfl: 3 ?  carbamazepine (TEGRETOL-XR) 100 MG 12 hr tablet, Take 1 tablet (100 mg total) by mouth once daily at bedtime., Disp: 30 tablet, Rfl: 3 ?  COVID-19 mRNA bivalent vaccine, Pfizer, (PFIZER COVID-19 VAC BIVALENT) injection, Inject into the muscle., Disp: 0.3 mL, Rfl: 0 ?  cyanocobalamin (,VITAMIN B-12,) 1000 MCG/ML injection, Inject 1 mL (1,000 mcg total) into the muscle once every 30 (thirty) days., Disp: 1 mL, Rfl: 3 ?  Cyanocobalamin  (VITAMIN B-12) 500 MCG SUBL, Place 1,500 mcg under the tongue daily., Disp: 150 tablet, Rfl:  ?  lipase/protease/amylase (CREON) 12000-38000 units CPEP capsule, Take 2 capsules by mouth with the first bite of each m

## 2022-01-03 LAB — URINE CULTURE
MICRO NUMBER:: 13226603
SPECIMEN QUALITY:: ADEQUATE

## 2022-01-07 ENCOUNTER — Other Ambulatory Visit: Payer: Self-pay

## 2022-01-07 MED ORDER — AMITRIPTYLINE HCL 50 MG PO TABS: 50.0000 mg | ORAL_TABLET | Freq: Every day | ORAL | 3 refills | Status: DC

## 2022-01-11 DIAGNOSIS — G4733 Obstructive sleep apnea (adult) (pediatric): Secondary | ICD-10-CM | POA: Diagnosis not present

## 2022-01-27 NOTE — Progress Notes (Signed)
PROVIDER NOTE: Information contained herein reflects review and annotations entered in association with encounter. Interpretation of such information and data should be left to medically-trained personnel. Information provided to patient can be located elsewhere in the medical record under "Patient Instructions". Document created using STT-dictation technology, any transcriptional errors that may result from process are unintentional.  ?  ?Patient: Dana Bradley  Service Category: E/M  Provider: Gaspar Cola, MD  ?DOB: October 02, 1970  DOS: 01/28/2022  Specialty: Interventional Pain Management  ?MRN: [id number]  Setting: Ambulatory outpatient  PCP: Dana Grana, PA-C  ?Type: Established Patient    Referring Provider: Delsa Grana, PA-C  ?Location: Office  Delivery: Face-to-face    ? ?HPI  ?[name], a 51 y.o. year old female, is here today because of her Chronic pain syndrome [G89.4]. [name] primary complain today is Back Pain (lower) ?Last encounter: My last encounter with her was on Visit date not found. ?Pertinent problems: [name] has Cervico-occipital neuralgia; DDD (degenerative disc disease), lumbosacral; Chronic pain of multiple joints; DDD (degenerative disc disease), lumbar; Fibromyalgia; Migraine without aura and with status migrainosus, not intractable; Chronic lower extremity cramps (Bilateral) (R>L); Chronic pain syndrome; Neurogenic pain; Chronic low back pain (1ry area of Pain) (Bilateral) (R>L) (midline); Chronic upper back pain (2ry area of Pain) (Bilateral) (L>R); Chronic abdominal pain (Right lower quadrant); Thoracic radiculitis (Bilateral: T10, T11); Chronic occipital neuralgia (3ry area of Pain) (Bilateral) (L>R); Chronic neck pain; Chronic cervical radicular pain (Bilateral) (L>R); Chronic shoulder blade pain (Bilateral) (L>R); Chronic upper extremity pain (Bilateral) (R>L); Chronic knee pain (Bilateral) (R>L); Chronic ankle pain (Bilateral); Cervical spondylosis with myelopathy and  radiculopathy; Nephrolithiasis; Plantar fasciitis of left foot; Cervicogenic headache; Bilateral leg edema; Chronic hip pain (Left); Chronic sacroiliac joint pain (Left); Lumbar facet syndrome (Bilateral) (L>R); Lumbar radiculitis (Left); Lumbar spondylosis; Migraine headache; Muscle spasticity; Osteoarthritis of shoulder (Bilateral); Lumbar L1-2 disc protrusion (Right); Myofascial pain syndrome (Left) (trapezius muscle); Trigger point of shoulder region (Left); Chronic fatigue syndrome with fibromyalgia; Occipital headache; Trigger point of neck (Left); Malar rash; Chronic migraine without aura, with intractable migraine, so stated, with status migrainosus; Weakness of leg (Left); Difficulty walking; Migraine with aura and with status migrainosus, not intractable; Cervico-occipital neuralgia (Left); DDD (degenerative disc disease), cervical; Cervical facet syndrome (Bilateral) (L>R); DDD (degenerative disc disease), thoracic; Osteoarthritis of hip (Left); Chronic groin pain (Bilateral) (L>R); Chronic hip pain (Bilateral) (L>R); Somatic dysfunction of sacroiliac joint (Bilateral); Other specified dorsopathies, sacral and sacrococcygeal region; Intractable migraine with aura without status migrainosus; Chronic lower extremity pain (Bilateral); Lumbar radiculitis (Right); Neurogenic urinary incontinence; Other intervertebral disc degeneration, lumbar region; Spinal stenosis of lumbosacral region; Hyperalgesia; Abnormal MRI, lumbar spine (12/27/2021); Chronic midline thoracic back pain; Abnormal MRI, thoracic spine (08/02/2019); Thoracic spinal stenosis (T7-8); Prolapse of thoracic disc with radiculopathy (T7-8); Spasm of muscle of lower back; Cervicalgia; Abnormal MRI, cervical spine (02/14/2021); Lateral epicondylitis, left elbow; Neurogenic bladder; and Urinary and fecal incontinence on their pertinent problem list. ?Pain Assessment: Severity of Chronic pain is reported as a 4 /10. Location: Back Lower/both legs to  the feet. Onset: More than a month ago. Quality: Burning, Aching, Stabbing. Timing: Intermittent. Modifying factor(s): medications, sitting/reclining. ?Vitals:  height is '5\' 3"'$  (1.6 m) and weight is 230 lb (104.3 kg). Her temporal temperature is 97.3 ?F (36.3 ?C) (abnormal). Her blood pressure is 119/75 and her pulse is 84. Her respiration is 18 and oxygen saturation is 99%.  ? ?Reason for encounter: both, medication management and post-procedure evaluation and assessment.  The  patient refers that since her last visit her adult child has been diagnosed with cancer.  For this reason she has had a lot going on and has not been able to follow-up with [place].  She has also been experiencing some new symptoms with the lower back and urinary and fecal incontinence associated with increased pain.  I have recommended that she see a neurosurgeon as soon as possible and she indicated that she already has an appointment to see a neurosurgeon at the [facility]. ? ?She currently complains of low back pain and lower extremity pain where the low back pain is worse than the leg pain.  In the case of the lower back pain it is in the midline but it also spreads bilaterally with the right side being worse than the left.  She refers that this pain been goes down both lower extremities again with the right being worse than the left.  In the case of the right lower extremity the pain goes all the way into the bottom and lateral aspect of her foot and what appears to be an S1 dermatomal distribution.  She refers having weakness in the right lower extremity while also having numbness in both of her feet.  She was also newly diagnosed with diabetes.  In addition, she refers having pain over the lateral anterior aspect of her right thigh and what appears to be the L3 dermatome.  She describes this pain as a burning sensation.  In the case of the left lower extremity the pain goes all the way down into the lateral aspect of her foot (again  over the S1 dermatome).  She describes the urinary and fecal incontinence that has been intermittent with the last episode having been yesterday.  I have communicated to the patient the urgency of having this looked at since it could become permanent if indeed it is secondary to some type of compression over the cauda equina. ? ? ?PMP reveals the patient to last have refilled her hydrocodone/APAP 7.5/325 (#120) on 02/05/2021.  She has continued to take pregabalin 150 mg capsule, 1 cap p.o. 3 times daily (#270/90 days) (90/month) (last filled on 12/31/2021).  Patient's last UDS was on 03/05/2018.  The patient indicates that she is currently taking the hydrocodone once or twice daily and the baclofen twice daily. ? ?The patient last attempted contact was on 03/01/2021.  Last interventional treatment was on 02/13/2021 at which time we completed a series of 3 left C7-T1 cervical ESI under fluoroscopic guidance and IV sedation.  The day following the procedure the patient called indicating that not only did not help, but it made things worse.  At that time we will order a follow-up cervical MRI. ? ?(02/14/2021) CERVICAL MRI FINDINGS: ?  ?DISC LEVELS: ?C4-5: Shallow right paracentral disc protrusion. ?C5-6: Right paracentral disc protrusion contacts and and results in slight impress upon the ventral cord. Mild canal stenosis. ?C6-7: Small left paracentral disc protrusion contacting the ventral cord resulting in borderline left-sided canal stenosis. ?  ?IMPRESSION: ?1. Unchanged small disc protrusions at the C4-5, C5-6, and C6-7 levels. ?2. Mild canal stenosis at C5-6 and C6-7. No significant foraminal stenosis at any level. ? ?Unfortunately, we were unable to contact the patient for follow-up evaluation after the procedure and to provide her follow-up information after her MRI. ? ?RTCB: 02/27/2022 ?Nonopioid transferred 11/08/2020: Lyrica, baclofen ? ?I have offered the patient a right-sided L3-4 LESI, however I would  prefer if she gets seen by the neurosurgeon  first.  She understood and accepted. ? ?Post-procedure evaluation  ?Date of service: 02/13/2021 ?Procedure:           Anesthesia, Analgesia, Anxiolysis:  ?Type: Ther

## 2022-01-28 ENCOUNTER — Ambulatory Visit: Payer: Medicaid Other | Attending: Pain Medicine | Admitting: Pain Medicine

## 2022-01-28 ENCOUNTER — Encounter: Payer: Self-pay | Admitting: Pain Medicine

## 2022-01-28 VITALS — BP 119/75 | HR 84 | Temp 97.3°F | Resp 18 | Ht 63.0 in | Wt 230.0 lb

## 2022-01-28 DIAGNOSIS — R32 Unspecified urinary incontinence: Secondary | ICD-10-CM | POA: Diagnosis not present

## 2022-01-28 DIAGNOSIS — M62838 Other muscle spasm: Secondary | ICD-10-CM | POA: Insufficient documentation

## 2022-01-28 DIAGNOSIS — G894 Chronic pain syndrome: Secondary | ICD-10-CM | POA: Insufficient documentation

## 2022-01-28 DIAGNOSIS — G8929 Other chronic pain: Secondary | ICD-10-CM | POA: Diagnosis not present

## 2022-01-28 DIAGNOSIS — Z79891 Long term (current) use of opiate analgesic: Secondary | ICD-10-CM | POA: Diagnosis not present

## 2022-01-28 DIAGNOSIS — R159 Full incontinence of feces: Secondary | ICD-10-CM | POA: Insufficient documentation

## 2022-01-28 DIAGNOSIS — M19012 Primary osteoarthritis, left shoulder: Secondary | ICD-10-CM | POA: Diagnosis not present

## 2022-01-28 DIAGNOSIS — Z79899 Other long term (current) drug therapy: Secondary | ICD-10-CM | POA: Diagnosis not present

## 2022-01-28 DIAGNOSIS — M5441 Lumbago with sciatica, right side: Secondary | ICD-10-CM | POA: Insufficient documentation

## 2022-01-28 DIAGNOSIS — M19011 Primary osteoarthritis, right shoulder: Secondary | ICD-10-CM | POA: Insufficient documentation

## 2022-01-28 DIAGNOSIS — N319 Neuromuscular dysfunction of bladder, unspecified: Secondary | ICD-10-CM | POA: Diagnosis not present

## 2022-01-28 DIAGNOSIS — R937 Abnormal findings on diagnostic imaging of other parts of musculoskeletal system: Secondary | ICD-10-CM | POA: Diagnosis not present

## 2022-01-28 DIAGNOSIS — M5481 Occipital neuralgia: Secondary | ICD-10-CM | POA: Diagnosis not present

## 2022-01-28 DIAGNOSIS — M5442 Lumbago with sciatica, left side: Secondary | ICD-10-CM | POA: Insufficient documentation

## 2022-01-28 DIAGNOSIS — M549 Dorsalgia, unspecified: Secondary | ICD-10-CM | POA: Diagnosis not present

## 2022-01-28 MED ORDER — HYDROCODONE-ACETAMINOPHEN 7.5-325 MG PO TABS: 1.0000 | ORAL_TABLET | Freq: Two times a day (BID) | ORAL | 0 refills | Status: DC | PRN

## 2022-01-28 MED ORDER — BACLOFEN 10 MG PO TABS
10.0000 mg | ORAL_TABLET | Freq: Two times a day (BID) | ORAL | 2 refills | Status: DC
Start: 2022-01-28 — End: 2022-10-01

## 2022-01-28 NOTE — Progress Notes (Signed)
Nursing Pain Medication Assessment:  ?Safety precautions to be maintained throughout the outpatient stay will include: orient to surroundings, keep bed in low position, maintain call bell within reach at all times, provide assistance with transfer out of bed and ambulation.  ?Medication Inspection Compliance: Pill count conducted under aseptic conditions, in front of the patient. Neither the pills nor the bottle was removed from the patient's sight at any time. Once count was completed pills were immediately returned to the patient in their original bottle. ? ?Medication: Hydrocodone/APAP ?Pill/Patch Count:  0 of 90 pills remain ?Pill/Patch Appearance: Markings consistent with prescribed medication ?Bottle Appearance: Standard pharmacy container. Clearly labeled. ?Filled Date: 05 / 09 /  2022 ?Last Medication intake:  Ran out of medicine more than 48 hours ago ?

## 2022-02-03 LAB — TOXASSURE SELECT 13 (MW), URINE

## 2022-02-10 DIAGNOSIS — G4733 Obstructive sleep apnea (adult) (pediatric): Secondary | ICD-10-CM | POA: Diagnosis not present

## 2022-02-11 ENCOUNTER — Encounter: Payer: Self-pay | Admitting: Physical Medicine and Rehabilitation

## 2022-02-11 ENCOUNTER — Encounter
Payer: Medicaid Other | Attending: Physical Medicine and Rehabilitation | Admitting: Physical Medicine and Rehabilitation

## 2022-02-11 VITALS — BP 130/84 | HR 95 | Ht 63.0 in | Wt 226.4 lb

## 2022-02-11 DIAGNOSIS — G43001 Migraine without aura, not intractable, with status migrainosus: Secondary | ICD-10-CM | POA: Insufficient documentation

## 2022-02-11 MED ORDER — RIZATRIPTAN BENZOATE 10 MG PO TABS: 10.0000 mg | ORAL_TABLET | ORAL | 0 refills | Status: DC | PRN

## 2022-02-11 NOTE — Progress Notes (Signed)
Occipital nerve block procedure note, bilateral, greater and lesser ? ?Written consent was obtained for the patient. Location for greater and lesser occipital nerves was determined using bony landmarks and points of maximal sensitivity.  The areas were cleaned with alcohol, vapocoolant spray applied, needle drawback performed, and a total of 4 cc of 0.5% Marcaine was injected to both these sites.  There were no complications from the procedure, and it was well tolerated.   ?

## 2022-02-19 DIAGNOSIS — G8929 Other chronic pain: Secondary | ICD-10-CM | POA: Diagnosis not present

## 2022-02-19 DIAGNOSIS — M5441 Lumbago with sciatica, right side: Secondary | ICD-10-CM | POA: Diagnosis not present

## 2022-02-19 DIAGNOSIS — M5442 Lumbago with sciatica, left side: Secondary | ICD-10-CM | POA: Diagnosis not present

## 2022-02-24 ENCOUNTER — Other Ambulatory Visit: Payer: Self-pay | Admitting: Gastroenterology

## 2022-02-24 ENCOUNTER — Other Ambulatory Visit: Payer: Self-pay | Admitting: Family Medicine

## 2022-02-24 ENCOUNTER — Encounter: Payer: Self-pay | Admitting: Family Medicine

## 2022-02-24 DIAGNOSIS — E782 Mixed hyperlipidemia: Secondary | ICD-10-CM

## 2022-02-26 ENCOUNTER — Other Ambulatory Visit: Payer: Self-pay

## 2022-02-26 MED ORDER — OMEPRAZOLE 40 MG PO CPDR: 40.0000 mg | DELAYED_RELEASE_CAPSULE | Freq: Every day | ORAL | 3 refills | Status: DC

## 2022-02-26 MED ORDER — ATORVASTATIN CALCIUM 40 MG PO TABS: ORAL_TABLET | Freq: Every day | ORAL | 3 refills | Status: DC

## 2022-02-28 ENCOUNTER — Other Ambulatory Visit (HOSPITAL_COMMUNITY): Payer: Self-pay

## 2022-02-28 ENCOUNTER — Encounter
Payer: Medicaid Other | Attending: Physical Medicine and Rehabilitation | Admitting: Physical Medicine and Rehabilitation

## 2022-02-28 ENCOUNTER — Telehealth: Payer: Self-pay | Admitting: *Deleted

## 2022-02-28 ENCOUNTER — Encounter: Payer: Self-pay | Admitting: Physical Medicine and Rehabilitation

## 2022-02-28 ENCOUNTER — Other Ambulatory Visit: Payer: Self-pay | Admitting: Physical Medicine and Rehabilitation

## 2022-02-28 ENCOUNTER — Telehealth: Payer: Self-pay

## 2022-02-28 VITALS — BP 119/80 | HR 88 | Ht 63.0 in | Wt 228.0 lb

## 2022-02-28 DIAGNOSIS — G43001 Migraine without aura, not intractable, with status migrainosus: Secondary | ICD-10-CM | POA: Diagnosis not present

## 2022-02-28 DIAGNOSIS — R112 Nausea with vomiting, unspecified: Secondary | ICD-10-CM | POA: Insufficient documentation

## 2022-02-28 DIAGNOSIS — H8110 Benign paroxysmal vertigo, unspecified ear: Secondary | ICD-10-CM | POA: Insufficient documentation

## 2022-02-28 MED ORDER — UBRELVY 50 MG PO TABS: 1.0000 | ORAL_TABLET | Freq: Two times a day (BID) | ORAL | 3 refills | Status: DC | PRN

## 2022-02-28 MED ORDER — VYEPTI 100 MG/ML IV SOLN
100.0000 mg | INTRAVENOUS | 3 refills | Status: DC
  Filled 2022-02-28: qty 2, 180d supply, fill #0

## 2022-02-28 MED ORDER — SCOPOLAMINE 1 MG/3DAYS TD PT72: 1.0000 | MEDICATED_PATCH | TRANSDERMAL | 12 refills | Status: DC

## 2022-02-28 NOTE — Patient Instructions (Signed)
Bioptemizers

## 2022-02-28 NOTE — Progress Notes (Signed)
Subjective:    Patient ID: Dana Bradley, female    DOB: [birth year], 51 y.o.   MRN: [id number]  HPI   1) Chronic Migraine:  -Dana Bradley a 51 year old woman who presents for f/u of her chronic migraine  Terrible stomach spasms and diarrhea. 12 hours later debilitating migraine. It usually starts in the afternoon. She feels that this may be part of her prodrome. She also has terrible vertigo that is getting worse. The Fiorcet helps a lot, the Migronal helps with the frontal headache. These make the pain bearable.  -having migraines close to every 24H -she is not functional -she is not sure if Maxaalt is helping. She is not taking with the Nurtec. It is helping some but she is not sure how much. The fioricet alone helps by putting her to sleep.  -she has not heart any news about the Vypeti infusion. The medication was approved  -has been having more dizziness.  -the pain has been worst since she had the occipital nerve blocks.  -she knows it is stress related -she feels a pain radiating down her arm as well. It also runs into her head and into her face.  -Sometimes her whole left side is fizzy.  -she has tried Amitriptyline.  -have been awful since December. Had a migraine from Dec 1st throughout 9th.  -the day before she was incapacitated.  -Vyepti has been approved -The nurtec and the Fiorocet help together to break the migraine.  -she needs a refill of her Fioricet and nausea, she finds this helpful.  -Nurtec- she is still working with the company to get this improved -she asks about prodrome and postdrome (she experiences cramping and diarrhea).  -on Sunday she closed out a 56 hour migraines. -she has been trying to follow ketogenic diet.  -Nurtec helping- 5 days in a row without a migraines.  -she feels if she could take it as a rescue medication in addition to every other day this may help.   2) Vertigo -Symptoms are worsening.  -She feels that this may be leading up to the  vertigo.  -She is interested in vestibular therapy.  -she had a heating pad on the shoulder and arm.   3) Cervical myofasical pain syndrome: -Muscles of neck are very tight, pain is worse on the left side.  -She did not find spinal injections helpful, they made the pain worse.   4) Insomnia: she sleeps in chunks -she is willing to increase amitriptyline to '50mg'$ .   5) Cognitive impairments: -she loves to cook and noticed she has been making more mistakes -she thinks the headaches are a problem.  6) Pain radiating from back into legs -feels twitches -worse at night -legs kick out at night.   7) IBS -terrible in the night -2am on Sunday. -horrible pain -often with diarrhea and improves symptoms after she has diarrhea.  -cramping.   Dana Bradley is a 51 year old woman who presents for follow-up of her chronic migraine. Her average pain is 6/10 and pain right now is 6/10. Her pain is intermittent, sharp, burning, stabbing, tingling, and aching.   She recently had 7 days without migraine. She is not sure what was associated with this. She had a pretty terrible bounceback.  She picked up the Migranal nasal spray. Our CMA explained how it works.   Her pain doctor is going to stop prescribing the Lyrica and she needs to find a new doctor to prescribe this.  Pain Inventory Average Pain 5 Pain Right Now 7 My pain is intermittent, sharp, burning, stabbing, and tingling  In the last 24 hours, has pain interfered with the following? General activity 8 Relation with others 8 Enjoyment of life 8 What TIME of day is your pain at its worst? morning , evening, and night Sleep (in general) Poor  Pain is worse with: bending and some activites Pain improves with: medication and TENS Relief from Meds: 5     Family History  Problem Relation Age of Onset   Depression Mother    Hypertension Mother    Cancer Mother        Skin   Hyperlipidemia Mother    Anxiety disorder Mother     Migraines Mother    Alcohol abuse Father    Depression Father    Stroke Father    Heart disease Father    Hypertension Father    Anxiety disorder Father    Depression Sister    Hyperlipidemia Sister    Diabetes Sister    Hypertension Sister    Polycystic ovary syndrome Sister    Bipolar disorder Sister    Anxiety disorder Sister    Migraines Sister    Depression Sister    Hypertension Sister    Anxiety disorder Sister    Migraines Sister    Breast cancer Maternal Grandmother 60   Cancer Maternal Grandmother 63       Breast   Thyroid disease Maternal Grandmother    Arthritis Maternal Grandmother    Hyperlipidemia Maternal Grandmother    Aneurysm Maternal Grandfather    Hypertension Maternal Grandfather    Heart disease Maternal Grandfather    Alzheimer's disease Paternal Grandmother    Heart attack Paternal Grandfather    Hypertension Paternal Grandfather    COPD Paternal Grandfather    Heart disease Paternal Grandfather    Migraines Daughter    Other Daughter        leomyoa scaroma   Migraines Daughter    Migraines Son    Alzheimer's disease Other    Bladder Cancer Neg Hx    Kidney cancer Neg Hx    Social History   Socioeconomic History   Marital status: Married    Spouse name: brian   Number of children: 51   Years of education: Not on file   Highest education level: Associate degree: academic program  Occupational History   Occupation: disbled    Comment: not able  Tobacco Use   Smoking status: Former    Packs/day: 4.00    Years: 3.00    Pack years: 12.00    Types: Cigarettes    Quit date: 12/10/1992    Years since quitting: 29.2   Smokeless tobacco: Never   Tobacco comments:    quit 25 years ago  Vaping Use   Vaping Use: Never used  Substance and Sexual Activity   Alcohol use: No    Comment: socially   Drug use: No   Sexual activity: Not Currently    Partners: Male    Birth control/protection: Surgical  Other Topics Concern   Not on file   Social History Narrative   Lives at home with her husband and 2 of her children   Right handed   Caffeine: 0-2 cups daily   Social Determinants of Health   Financial Resource Strain: Not on file  Food Insecurity: Not on file  Transportation Needs: Not on file  Physical Activity: Not on file  Stress: Not on file  Social Connections: Not on file   Past Surgical History:  Procedure Laterality Date   ABLATION     Uterine   BREAST BIOPSY Left    2014 U/S bx fibroadenoma   CARDIAC CATHETERIZATION     with ablation   COLONOSCOPY WITH PROPOFOL N/A 05/17/2015   Procedure: COLONOSCOPY WITH PROPOFOL;  Surgeon: Manya Silvas, MD;  Location: [facility] ENDOSCOPY;  Service: Endoscopy;  Laterality: N/A;   COLONOSCOPY WITH PROPOFOL N/A 03/20/2020   Procedure: COLONOSCOPY WITH PROPOFOL;  Surgeon: Jonathon Bellows, MD;  Location: [facility] ENDOSCOPY;  Service: Gastroenterology;  Laterality: N/A;   ESOPHAGOGASTRODUODENOSCOPY N/A 05/17/2015   Procedure: ESOPHAGOGASTRODUODENOSCOPY (EGD);  Surgeon: Manya Silvas, MD;  Location: [facility] ENDOSCOPY;  Service: Endoscopy;  Laterality: N/A;   KNEE ARTHROSCOPY     spg     6/18   Tibial Tubercle Bypass Right 1998   TUBAL LIGATION  10/01/1999   Past Surgical History:  Procedure Laterality Date   ABLATION     Uterine   BREAST BIOPSY Left    2014 U/S bx fibroadenoma   CARDIAC CATHETERIZATION     with ablation   COLONOSCOPY WITH PROPOFOL N/A 05/17/2015   Procedure: COLONOSCOPY WITH PROPOFOL;  Surgeon: Manya Silvas, MD;  Location: [facility] ENDOSCOPY;  Service: Endoscopy;  Laterality: N/A;   COLONOSCOPY WITH PROPOFOL N/A 03/20/2020   Procedure: COLONOSCOPY WITH PROPOFOL;  Surgeon: Jonathon Bellows, MD;  Location: [facility] ENDOSCOPY;  Service: Gastroenterology;  Laterality: N/A;   ESOPHAGOGASTRODUODENOSCOPY N/A 05/17/2015   Procedure: ESOPHAGOGASTRODUODENOSCOPY (EGD);  Surgeon: Manya Silvas, MD;  Location: [facility] ENDOSCOPY;  Service: Endoscopy;  Laterality: N/A;   KNEE  ARTHROSCOPY     spg     6/18   Tibial Tubercle Bypass Right 1998   TUBAL LIGATION  10/01/1999   Past Medical History:  Diagnosis Date   Acute postoperative pain 04/07/2017   Anxiety    Bursitis    Chronic fatigue 12/12/2017   Chronic fatigue syndrome    Colitis 2021   Edema leg 05/02/2015   Fibromyalgia    GERD (gastroesophageal reflux disease)    IBS (irritable bowel syndrome)    Knee pain, bilateral 12/21/2008   Qualifier: Diagnosis of  By: Hassell Done FNP, [place]     Lumbar discitis    Migraines    Osteoarthritis    Right hand pain 04/10/2015   [facility] has done nerve conduction studies and ruled out carpal tunnel.    Sleep apnea    Spinal stenosis    SVT (supraventricular tachycardia) (HCC)    Vertigo    Vitamin D deficiency 05/01/2016   There were no vitals taken for this visit.  Opioid Risk Score:   Fall Risk Score:  `1  Depression screen [facility] 2/9     02/11/2022   10:16 AM 01/28/2022    8:17 AM 01/02/2022    1:22 PM 12/26/2021    1:07 PM 11/09/2021   11:21 AM 09/28/2021   11:25 AM 08/30/2021    8:33 AM  Depression screen PHQ 2/9  Decreased Interest 3 0 0 0 0 0 2  Down, Depressed, Hopeless 3 0 0 0 0 0 2  PHQ - 2 Score 6 0 0 0 0 0 4  Altered sleeping   0 0   2  Tired, decreased energy   0 0   2  Change in appetite   0 0   2  Feeling bad or failure about yourself    0 0   2  Trouble concentrating  0 0   3  Moving slowly or fidgety/restless   0 0   0  Suicidal thoughts   0 0   0  PHQ-9 Score   0 0   15  Difficult doing work/chores   Not difficult at all Not difficult at all   Somewhat difficult   Review of Systems  Constitutional: Negative.   HENT: Negative.    Eyes: Negative.   Respiratory: Negative.    Cardiovascular: Negative.   Gastrointestinal: Negative.   Endocrine: Negative.   Genitourinary: Negative.   Musculoskeletal:  Positive for back pain.       Left side of body from head to toes  Skin: Negative.   Allergic/Immunologic: Negative.    Neurological:  Positive for dizziness and headaches.  Hematological: Negative.   Psychiatric/Behavioral: Negative.    All other systems reviewed and are negative.     Objective:   Physical Exam Gen: no distress, normal appearing, BMI 40.39, weight 22lbs HEENT: oral mucosa pink and moist, NCAT Cardio: Reg rate Chest: normal effort, normal rate of breathing Abd: soft, non-distended Ext: no edema Psych: pleasant, normal affect Skin: intact Neuro: Alert and oriented x3 Musculoskeletal: Severe tightness in her left upper back/neck muscles. Limited range of motion in left arm due to shooting pain. +Slump test right >left. No focal weakness.      Assessment & Plan:  Mrs. Boullion is a 51 year old woman who presents for f/u with chronic intractable migraines s/p numerous treatments, severe fibromyalgia, and IBS, and nausea, and vertigo.     Vertigo: In the future, restart for cervical myofascial pain syndrome: myofascial release, postural correction, stretching and strengthening of the muscles of the neck and upper back, development of HEP. Conitnue heating pads to muscles of upper back and neck. She is doing HEP.  -referred for vestibular therapy.  -discussed current symptoms.  -discussed response to meclizine -discussed worsening symptoms when in car   Migraines: Refilled Fioricet, which is one of the medications that helps her. Refilled today. She takes this at least once every time she gets a migraine. Advised to use upon migraine onset and to not use more frequently than q6H during migraine. Refilled Zofran for nausea last week, and this has been helping her. Continue metoprolol which can be helpful in migraine prophylaxis (HR is well controlled). Prescribed ergot nasal spray to try upon migraine initiation- advised no more frequently than 4 sprays per hour (still awaiting on prior auth). Discussed avoiding foods that may trigger migraines.  -continue vitamin D  -Discussed that I can  provide refill for her Lyrica when she needs it.  -topamax was not effective.  -When she gets the migraines her loss of words is getting worse.  -Ordered Vyepti- she will find out cost from her pharmacy. She will bring paperwork for this next visit. She is excited to try it.  -prescribed Ubrelvy '50mg'$ , discussed that she can use this to break migraine, and if migraine persists after 2 hours, she can take another dose. Discussed that she should not take more than 2 doses in 24 hours -discussed that Roselyn Meier has a safer side effect profile than the Triptans, encouraged to use instead of Maxaalt.  -continue ear piercing. Pain feels more like pressure and less acidic.  -no benefits from Botox.  -continue to track migraines.  -Continue Baclofen for pain relief. Minimize use of Hydrocodone. She is only taking Lyrica once per day to make it last. Continue Migranal which is helping. Will occipital nerve  block -Discussed that Mrs. Stickles's greatest source of happiness is her family. This community will be essential in helping her recover from her chronic pain and to increase her daily activity. Her daughter is also suffering from similar migraines unfortunately.  -Continue ginger, herbs, turmeric, blueberries, eating real food. Continue cutting down sugar. Using local honey is a great alternative. -Provided with a pain relief journal and discussed that it contains foods and lifestyle tips to naturally help to improve pain. Discussed that these lifestyle strategies are also very good for health unlike some medications which can have negative side effects. Discussed that the act of keeping a journal can be therapeutic and helpful to realize patterns what helps to trigger and alleviate pain.   -Continue Nurtec every other day- may use for breakthrough migraines as well.  -Discussed plan for Vypeti at [facility] infusion center.  Discussed that exercise is one of the most effective treatments for fibromyalgia. This  will also help with her obesity. Made goal with Mrs. Andrew to walk outside her home at least once per day, and to garden at least once per day (her favorite activity). Can use elliptical which she has at home on rainy days. The heat has been oppressive and so she has been trying to do the latter more.  Foods to alleviate migraine:  1) dark leafy greens 2) avocado 3) tuna 4) samon and mackerel 5) beans and legumes Supplements that can be helpful: feverfew, B12, and magnesium  Foods to avoid in migraine: 1) Excessive (or irregular timing) coffee 2) red wine 3) aged cheeses 4) chocolate 5) citrus fruits 6) aspartame and other artifical sweeteners 7) yeast 8) MSG (in processed foods) 9) processed and cured meats 10) nuts and certain seeds 11) chicken livers and other organ meats 12) dairy products like buttermilk, sour cream, and yogurt 13) dried fruits like dates, figs, and raisins 14) garlic 15) onions 16) potato chips 17) pickled foods like olives and sauerkraut 18) some fresh fruits like ripe banana, papaya, red plums, raspberries, kiwi, pineapple 19) tomato-based products  Recommend to keep a migraine diary: rate daily the severity of your headache (1-10) and what foods you eat that day to help determine patterns.    Cervical facet arthrosis: Cervical XR normal- discussed results with patient. Pain is worse on left side.   Stress: -Her daughter was recently diagnosed with sarcoma and this is a great source of stress and fear to Mrs. [name]. It has been a turbulent time for her and this has understandably worsened her symptoms. Discussed benefits of gratitude journaling and she plans to try this.   Insomnia:  -continue amitirptyline to '50mg'$ .  -Try to go outside near sunrise -Get exercise during the day.  -Discussed good sleep hygiene: turning off all devices an hour before bedtime.  -Chamomile tea with dinner.  -Can consider over the counter melatonin -discussed  magnesium  Nausea:  -continue zofran -start B6 -discussed scopolamine patch, discussed to place behind ear and lave on 72 hours.    Cervical myofascial pain syndrome: Will try some trigger point injections with occipital nerve block next visit.   IBS: -continue kombucha, yogurt (she makes her own)  Trigeminal Neuralgia: - continue Carbamazepine.  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 Letta Pate for ESI unchanged. Shallow broad-based disc protrusion L4-5 unchanged. -continue Norco -continue turmeric Turmeric to reduce inflammation--can be used in cooking or taken as a supplement.  Benefits of turmeric:  -Highly anti-inflammatory  -Increases antioxidants  -Improves memory, attention, brain disease  -Lowers risk of heart disease  -May help prevent cancer  -Decreases pain  -Alleviates depression  -Delays aging and decreases risk of chronic disease  -Consume with black pepper to increase absorption    Turmeric Milk Recipe:  1 cup milk  1 tsp turmeric  1 tsp cinnamon  1 tsp grated ginger (optional)  Black pepper (boosts the anti-inflammatory properties of turmeric).  1 tsp honey

## 2022-02-28 NOTE — Telephone Encounter (Signed)
Jonelle Sidle from [place] about the Rx sent over for the [facility].  There is a discrepancy between the dispense amt and the directions, and also there is no infusion date or location noted.  Please call them at [phone].

## 2022-02-28 NOTE — Telephone Encounter (Signed)
PA submitted for Ubrelvy. Scopolamine approved 02/28/22 through 02/28/23

## 2022-03-01 ENCOUNTER — Other Ambulatory Visit (HOSPITAL_COMMUNITY): Payer: Self-pay

## 2022-03-01 ENCOUNTER — Other Ambulatory Visit: Payer: Self-pay | Admitting: *Deleted

## 2022-03-01 MED ORDER — VYEPTI 100 MG/ML IV SOLN
100.0000 mg | INTRAVENOUS | 3 refills | Status: DC
  Filled 2022-03-01: qty 1, 90d supply, fill #0
  Filled 2022-03-08: qty 1, 34d supply, fill #0
  Filled 2022-05-24 – 2022-08-27 (×2): qty 1, 34d supply, fill #1
  Filled 2022-12-02: qty 1, 34d supply, fill #2

## 2022-03-01 NOTE — Telephone Encounter (Signed)
Correction made to Rx for Vyepti and sent to [place].

## 2022-03-01 NOTE — Telephone Encounter (Signed)
Order clarified by Dr Ranell Patrick for [facility]. It should be '100mg'$  every 3 months, the location should be the [facility] infusion center, the date should be as soon as possible, and she can have 3 refills.

## 2022-03-01 NOTE — Telephone Encounter (Signed)
Dana Bradley denied cause patient has more than 15 headaches days per month during the prior 6 months.

## 2022-03-04 ENCOUNTER — Encounter: Payer: Self-pay | Admitting: Pain Medicine

## 2022-03-04 ENCOUNTER — Ambulatory Visit: Payer: Medicaid Other | Attending: Pain Medicine | Admitting: Pain Medicine

## 2022-03-04 VITALS — BP 128/90 | HR 101 | Temp 97.3°F | Resp 18 | Ht 63.0 in | Wt 226.0 lb

## 2022-03-04 DIAGNOSIS — G894 Chronic pain syndrome: Secondary | ICD-10-CM | POA: Diagnosis not present

## 2022-03-04 DIAGNOSIS — G8929 Other chronic pain: Secondary | ICD-10-CM

## 2022-03-04 DIAGNOSIS — M5441 Lumbago with sciatica, right side: Secondary | ICD-10-CM | POA: Insufficient documentation

## 2022-03-04 DIAGNOSIS — M19012 Primary osteoarthritis, left shoulder: Secondary | ICD-10-CM | POA: Insufficient documentation

## 2022-03-04 DIAGNOSIS — M19011 Primary osteoarthritis, right shoulder: Secondary | ICD-10-CM

## 2022-03-04 DIAGNOSIS — M549 Dorsalgia, unspecified: Secondary | ICD-10-CM | POA: Insufficient documentation

## 2022-03-04 DIAGNOSIS — M5442 Lumbago with sciatica, left side: Secondary | ICD-10-CM | POA: Insufficient documentation

## 2022-03-04 DIAGNOSIS — Z79891 Long term (current) use of opiate analgesic: Secondary | ICD-10-CM

## 2022-03-04 DIAGNOSIS — M5481 Occipital neuralgia: Secondary | ICD-10-CM | POA: Diagnosis not present

## 2022-03-04 DIAGNOSIS — M62838 Other muscle spasm: Secondary | ICD-10-CM

## 2022-03-04 DIAGNOSIS — Z79899 Other long term (current) drug therapy: Secondary | ICD-10-CM

## 2022-03-04 MED ORDER — HYDROCODONE-ACETAMINOPHEN 7.5-325 MG PO TABS: 1.0000 | ORAL_TABLET | Freq: Two times a day (BID) | ORAL | 0 refills | Status: DC | PRN

## 2022-03-04 NOTE — Progress Notes (Signed)
Nursing Pain Medication Assessment:  Safety precautions to be maintained throughout the outpatient stay will include: orient to surroundings, keep bed in low position, maintain call bell within reach at all times, provide assistance with transfer out of bed and ambulation.  Medication Inspection Compliance: Pill count conducted under aseptic conditions, in front of the patient. Neither the pills nor the bottle was removed from the patient's sight at any time. Once count was completed pills were immediately returned to the patient in their original bottle.  Medication: Hydrocodone/APAP Pill/Patch Count:  14 of 60 pills remain Pill/Patch Appearance: Markings consistent with prescribed medication Bottle Appearance: Standard pharmacy container. Clearly labeled. Filled Date: 05  / 13 / 2023 Last Medication intake:  Today

## 2022-03-04 NOTE — Patient Instructions (Signed)
____________________________________________________________________________________________  Medication Rules  Purpose: To inform patients, and their family members, of our rules and regulations.  Applies to: All patients receiving prescriptions (written or electronic).  Pharmacy of record: Pharmacy where electronic prescriptions will be sent. If written prescriptions are taken to a different pharmacy, please inform the nursing staff. The pharmacy listed in the electronic medical record should be the one where you would like electronic prescriptions to be sent.  Electronic prescriptions: In compliance with the [place] Strengthen Opioid Misuse Prevention (STOP) Act of 2017 (Session Law 2017-74/H243), effective September 30, 2018, all controlled substances must be electronically prescribed. Calling prescriptions to the pharmacy will cease to exist.  Prescription refills: Only during scheduled appointments. Applies to all prescriptions.  NOTE: The following applies primarily to controlled substances (Opioid* Pain Medications).   Type of encounter (visit): For patients receiving controlled substances, face-to-face visits are required. (Not an option or up to the patient.)  Patient's responsibilities: Pain Pills: Bring all pain pills to every appointment (except for procedure appointments). Pill Bottles: Bring pills in original pharmacy bottle. Always bring the newest bottle. Bring bottle, even if empty. Medication refills: You are responsible for knowing and keeping track of what medications you take and those you need refilled. The day before your appointment: write a list of all prescriptions that need to be refilled. The day of the appointment: give the list to the admitting nurse. Prescriptions will be written only during appointments. No prescriptions will be written on procedure days. If you forget a medication: it will not be "Called in", "Faxed", or "electronically sent". You will  need to get another appointment to get these prescribed. No early refills. Do not call asking to have your prescription filled early. Prescription Accuracy: You are responsible for carefully inspecting your prescriptions before leaving our office. Have the discharge nurse carefully go over each prescription with you, before taking them home. Make sure that your name is accurately spelled, that your address is correct. Check the name and dose of your medication to make sure it is accurate. Check the number of pills, and the written instructions to make sure they are clear and accurate. Make sure that you are given enough medication to last until your next medication refill appointment. Taking Medication: Take medication as prescribed. When it comes to controlled substances, taking less pills or less frequently than prescribed is permitted and encouraged. Never take more pills than instructed. Never take medication more frequently than prescribed.  Inform other Doctors: Always inform, all of your healthcare providers, of all the medications you take. Pain Medication from other Providers: You are not allowed to accept any additional pain medication from any other Doctor or Healthcare provider. There are two exceptions to this rule. (see below) In the event that you require additional pain medication, you are responsible for notifying us, as stated below. Cough Medicine: Often these contain an opioid, such as codeine or hydrocodone. Never accept or take cough medicine containing these opioids if you are already taking an opioid* medication. The combination may cause respiratory failure and death. Medication Agreement: You are responsible for carefully reading and following our Medication Agreement. This must be signed before receiving any prescriptions from our practice. Safely store a copy of your signed Agreement. Violations to the Agreement will result in no further prescriptions. (Additional copies of our  Medication Agreement are available upon request.) Laws, Rules, & Regulations: All patients are expected to follow all Federal and State Laws, Statutes, Rules, & Regulations. Ignorance of   the Laws does not constitute a valid excuse.  Illegal drugs and Controlled Substances: The use of illegal substances (including, but not limited to marijuana and its derivatives) and/or the illegal use of any controlled substances is strictly prohibited. Violation of this rule may result in the immediate and permanent discontinuation of any and all prescriptions being written by our practice. The use of any illegal substances is prohibited. Adopted CDC guidelines & recommendations: Target dosing levels will be at or below 60 MME/day. Use of benzodiazepines** is not recommended.  Exceptions: There are only two exceptions to the rule of not receiving pain medications from other Healthcare Providers. Exception #1 (Emergencies): In the event of an emergency (i.e.: accident requiring emergency care), you are allowed to receive additional pain medication. However, you are responsible for: As soon as you are able, call our office [phone], at any time of the day or night, and leave a message stating your name, the date and nature of the emergency, and the name and dose of the medication prescribed. In the event that your call is answered by a member of our staff, make sure to document and save the date, time, and the name of the person that took your information.  Exception #2 (Planned Surgery): In the event that you are scheduled by another doctor or dentist to have any type of surgery or procedure, you are allowed (for a period no longer than 30 days), to receive additional pain medication, for the acute post-op pain. However, in this case, you are responsible for picking up a copy of our "Post-op Pain Management for Surgeons" handout, and giving it to your surgeon or dentist. This document is available at our office, and  does not require an appointment to obtain it. Simply go to our office during business hours (Monday-Thursday from 8:00 AM to 4:00 PM) (Friday 8:00 AM to 12:00 Noon) or if you have a scheduled appointment with us, prior to your surgery, and ask for it by name. In addition, you are responsible for: calling our office [phone], at any time of the day or night, and leaving a message stating your name, name of your surgeon, type of surgery, and date of procedure or surgery. Failure to comply with your responsibilities may result in termination of therapy involving the controlled substances. Medication Agreement Violation. Following the above rules, including your responsibilities will help you in avoiding a Medication Agreement Violation ("Breaking your Pain Medication Contract").  *Opioid medications include: morphine, codeine, oxycodone, oxymorphone, hydrocodone, hydromorphone, meperidine, tramadol, tapentadol, buprenorphine, fentanyl, methadone. **Benzodiazepine medications include: diazepam (Valium), alprazolam (Xanax), clonazepam (Klonopine), lorazepam (Ativan), clorazepate (Tranxene), chlordiazepoxide (Librium), estazolam (Prosom), oxazepam (Serax), temazepam (Restoril), triazolam (Halcion) (Last updated: 06/27/2021) ____________________________________________________________________________________________  ____________________________________________________________________________________________  Medication Recommendations and Reminders  Applies to: All patients receiving prescriptions (written and/or electronic).  Medication Rules & Regulations: These rules and regulations exist for your safety and that of others. They are not flexible and neither are we. Dismissing or ignoring them will be considered "non-compliance" with medication therapy, resulting in complete and irreversible termination of such therapy. (See document titled "Medication Rules" for more details.) In all conscience,  because of safety reasons, we cannot continue providing a therapy where the patient does not follow instructions.  Pharmacy of record:  Definition: This is the pharmacy where your electronic prescriptions will be sent.  We do not endorse any particular pharmacy, however, we have experienced problems with [facility] not securing enough medication supply for the community. We do not restrict you   in your choice of pharmacy. However, once we write for your prescriptions, we will NOT be re-sending more prescriptions to fix restricted supply problems created by your pharmacy, or your insurance.  The pharmacy listed in the electronic medical record should be the one where you want electronic prescriptions to be sent. If you choose to change pharmacy, simply notify our nursing staff.  Recommendations: Keep all of your pain medications in a safe place, under lock and key, even if you live alone. We will NOT replace lost, stolen, or damaged medication. After you fill your prescription, take 1 week's worth of pills and put them away in a safe place. You should keep a separate, properly labeled bottle for this purpose. The remainder should be kept in the original bottle. Use this as your primary supply, until it runs out. Once it's gone, then you know that you have 1 week's worth of medicine, and it is time to come in for a prescription refill. If you do this correctly, it is unlikely that you will ever run out of medicine. To make sure that the above recommendation works, it is very important that you make sure your medication refill appointments are scheduled at least 1 week before you run out of medicine. To do this in an effective manner, make sure that you do not leave the office without scheduling your next medication management appointment. Always ask the nursing staff to show you in your prescription , when your medication will be running out. Then arrange for the receptionist to get you a return appointment,  at least 7 days before you run out of medicine. Do not wait until you have 1 or 2 pills left, to come in. This is very poor planning and does not take into consideration that we may need to cancel appointments due to bad weather, sickness, or emergencies affecting our staff. DO NOT ACCEPT A "Partial Fill": If for any reason your pharmacy does not have enough pills/tablets to completely fill or refill your prescription, do not allow for a "partial fill". The law allows the pharmacy to complete that prescription within 72 hours, without requiring a new prescription. If they do not fill the rest of your prescription within those 72 hours, you will need a separate prescription to fill the remaining amount, which we will NOT provide. If the reason for the partial fill is your insurance, you will need to talk to the pharmacist about payment alternatives for the remaining tablets, but again, DO NOT ACCEPT A PARTIAL FILL, unless you can trust your pharmacist to obtain the remainder of the pills within 72 hours.  Prescription refills and/or changes in medication(s):  Prescription refills, and/or changes in dose or medication, will be conducted only during scheduled medication management appointments. (Applies to both, written and electronic prescriptions.) No refills on procedure days. No medication will be changed or started on procedure days. No changes, adjustments, and/or refills will be conducted on a procedure day. Doing so will interfere with the diagnostic portion of the procedure. No phone refills. No medications will be "called into the pharmacy". No Fax refills. No weekend refills. No Holliday refills. No after hours refills.  Remember:  Business hours are:  Monday to Thursday 8:00 AM to 4:00 PM Provider's Schedule: Rissa Turley, MD - Appointments are:  Medication management: Monday and Wednesday 8:00 AM to 4:00 PM Procedure day: Tuesday and Thursday 7:30 AM to 4:00 PM Bilal Lateef, MD -  Appointments are:  Medication management: Tuesday and Thursday 8:00   AM to 4:00 PM Procedure day: Monday and Wednesday 7:30 AM to 4:00 PM (Last update: 04/19/2020) ____________________________________________________________________________________________  ____________________________________________________________________________________________  CBD (cannabidiol) & Delta-8 (Delta-8 tetrahydrocannabinol) WARNING  Intro: Cannabidiol (CBD) and tetrahydrocannabinol (THC), are two natural compounds found in plants of the Cannabis genus. They can both be extracted from hemp or cannabis. Hemp and cannabis come from the Cannabis sativa plant. Both compounds interact with your body's endocannabinoid system, but they have very different effects. CBD does not produce the high sensation associated with cannabis. Delta-8 tetrahydrocannabinol, also known as delta-8 THC, is a psychoactive substance found in the Cannabis sativa plant, of which marijuana and hemp are two varieties. THC is responsible for the high associated with the illicit use of marijuana.  Applicable to: All individuals currently taking or considering taking CBD (cannabidiol) and, more important, all patients taking opioid analgesic controlled substances (pain medication). (Example: oxycodone; oxymorphone; hydrocodone; hydromorphone; morphine; methadone; tramadol; tapentadol; fentanyl; buprenorphine; butorphanol; dextromethorphan; meperidine; codeine; etc.)  Legal status: CBD remains a Schedule I drug prohibited for any use. CBD is illegal with one exception. In the [place], CBD has a limited Food and Drug Administration (FDA) approval for the treatment of two specific types of epilepsy disorders. Only one CBD product has been approved by the FDA for this purpose: "Epidiolex". FDA is aware that some companies are marketing products containing cannabis and cannabis-derived compounds in ways that violate the Federal Food, Drug and Cosmetic Act  (FD&C Act) and that may put the health and safety of consumers at risk. The FDA, a Federal agency, has not enforced the CBD status since 2018. UPDATE: (11/16/2021) The Drug Enforcement Agency (DEA) issued a letter stating that "delta" cannabinoids, including Delta-8-THCO and Delta-9-THCO, synthetically derived from hemp do not qualify as hemp and will be viewed as Schedule I drugs. (Schedule I drugs, substances, or chemicals are defined as drugs with no currently accepted medical use and a high potential for abuse. Some examples of Schedule I drugs are: heroin, lysergic acid diethylamide (LSD), marijuana (cannabis), 3,4-methylenedioxymethamphetamine (ecstasy), methaqualone, and peyote.) (https://www.dea.gov)  Legality: Some manufacturers ship CBD products nationally, which is illegal. Often such products are sold online and are therefore available throughout the country. CBD is openly sold in head shops and health food stores in some states where such sales have not been explicitly legalized. Selling unapproved products with unsubstantiated therapeutic claims is not only a violation of the law, but also can put patients at risk, as these products have not been proven to be safe or effective. Federal illegality makes it difficult to conduct research on CBD.  Reference: "FDA Regulation of Cannabis and Cannabis-Derived Products, Including Cannabidiol (CBD)" - https://www.fda.gov/news-events/public-health-focus/fda-regulation-cannabis-and-cannabis-derived-products-including-cannabidiol-cbd  Warning: CBD is not FDA approved and has not undergo the same manufacturing controls as prescription drugs.  This means that the purity and safety of available CBD may be questionable. Most of the time, despite manufacturer's claims, it is contaminated with THC (delta-9-tetrahydrocannabinol - the chemical in marijuana responsible for the "HIGH").  When this is the case, the THC contaminant will trigger a positive urine drug  screen (UDS) test for Marijuana (carboxy-THC). Because a positive UDS for any illicit substance is a violation of our medication agreement, your opioid analgesics (pain medicine) may be permanently discontinued. The FDA recently put out a warning about 5 things that everyone should be aware of regarding Delta-8 THC: Delta-8 THC products have not been evaluated or approved by the FDA for safe use and may be marketed in ways that put the   public health at risk. The FDA has received adverse event reports involving delta-8 THC-containing products. Delta-8 THC has psychoactive and intoxicating effects. Delta-8 THC manufacturing often involve use of potentially harmful chemicals to create the concentrations of delta-8 THC claimed in the marketplace. The final delta-8 THC product may have potentially harmful by-products (contaminants) due to the chemicals used in the process. Manufacturing of delta-8 THC products may occur in uncontrolled or unsanitary settings, which may lead to the presence of unsafe contaminants or other potentially harmful substances. Delta-8 THC products should be kept out of the reach of children and pets.  MORE ABOUT CBD  General Information: CBD was discovered in 1940 and it is a derivative of the cannabis sativa genus plants (Marijuana and Hemp). It is one of the 113 identified substances found in Marijuana. It accounts for up to 40% of the plant's extract. As of 2018, preliminary clinical studies on CBD included research for the treatment of anxiety, movement disorders, and pain. CBD is available and consumed in multiple forms, including inhalation of smoke or vapor, as an aerosol spray, and by mouth. It may be supplied as an oil containing CBD, capsules, dried cannabis, or as a liquid solution. CBD is thought not to be as psychoactive as THC (delta-9-tetrahydrocannabinol - the chemical in marijuana responsible for the "HIGH"). Studies suggest that CBD may interact with different  biological target receptors in the body, including cannabinoid and other neurotransmitter receptors. As of 2018 the mechanism of action for its biological effects has not been determined.  Side-effects  Adverse reactions: Dry mouth, diarrhea, decreased appetite, fatigue, drowsiness, malaise, weakness, sleep disturbances, and others.  Drug interactions: CBC may interact with other medications such as blood-thinners. Because CBD causes drowsiness on its own, it also increases the drowsiness caused by other medications, including antihistamines (such as Benadryl), benzodiazepines (Xanax, Ativan, Valium), antipsychotics, antidepressants and opioids, as well as alcohol and supplements such as kava, melatonin and St. John's Wort. Be cautious with the following combinations:   Brivaracetam (Briviact) Brivaracetam is changed and broken down by the body. CBD might decrease how quickly the body breaks down brivaracetam. This might increase levels of brivaracetam in the body.  Caffeine Caffeine is changed and broken down by the body. CBD might decrease how quickly the body breaks down caffeine. This might increase levels of caffeine in the body.  Carbamazepine (Tegretol) Carbamazepine is changed and broken down by the body. CBD might decrease how quickly the body breaks down carbamazepine. This might increase levels of carbamazepine in the body and increase its side effects.  Citalopram (Celexa) Citalopram is changed and broken down by the body. CBD might decrease how quickly the body breaks down citalopram. This might increase levels of citalopram in the body and increase its side effects.  Clobazam (Onfi) Clobazam is changed and broken down by the liver. CBD might decrease how quickly the liver breaks down clobazam. This might increase the effects and side effects of clobazam.  Eslicarbazepine (Aptiom) Eslicarbazepine is changed and broken down by the body. CBD might decrease how quickly the body  breaks down eslicarbazepine. This might increase levels of eslicarbazepine in the body by a small amount.  Everolimus (Zostress) Everolimus is changed and broken down by the body. CBD might decrease how quickly the body breaks down everolimus. This might increase levels of everolimus in the body.  Lithium Taking higher doses of CBD might increase levels of lithium. This can increase the risk of lithium toxicity.  Medications changed by the liver (  Cytochrome P450 1A1 (CYP1A1) substrates) Some medications are changed and broken down by the liver. CBD might change how quickly the liver breaks down these medications. This could change the effects and side effects of these medications.  Medications changed by the liver (Cytochrome P450 1A2 (CYP1A2) substrates) Some medications are changed and broken down by the liver. CBD might change how quickly the liver breaks down these medications. This could change the effects and side effects of these medications.  Medications changed by the liver (Cytochrome P450 1B1 (CYP1B1) substrates) Some medications are changed and broken down by the liver. CBD might change how quickly the liver breaks down these medications. This could change the effects and side effects of these medications.  Medications changed by the liver (Cytochrome P450 2A6 (CYP2A6) substrates) Some medications are changed and broken down by the liver. CBD might change how quickly the liver breaks down these medications. This could change the effects and side effects of these medications.  Medications changed by the liver (Cytochrome P450 2B6 (CYP2B6) substrates) Some medications are changed and broken down by the liver. CBD might change how quickly the liver breaks down these medications. This could change the effects and side effects of these medications.  Medications changed by the liver (Cytochrome P450 2C19 (CYP2C19) substrates) Some medications are changed and broken down by the liver.  CBD might change how quickly the liver breaks down these medications. This could change the effects and side effects of these medications.  Medications changed by the liver (Cytochrome P450 2C8 (CYP2C8) substrates) Some medications are changed and broken down by the liver. CBD might change how quickly the liver breaks down these medications. This could change the effects and side effects of these medications.  Medications changed by the liver (Cytochrome P450 2C9 (CYP2C9) substrates) Some medications are changed and broken down by the liver. CBD might change how quickly the liver breaks down these medications. This could change the effects and side effects of these medications.  Medications changed by the liver (Cytochrome P450 2D6 (CYP2D6) substrates) Some medications are changed and broken down by the liver. CBD might change how quickly the liver breaks down these medications. This could change the effects and side effects of these medications.  Medications changed by the liver (Cytochrome P450 2E1 (CYP2E1) substrates) Some medications are changed and broken down by the liver. CBD might change how quickly the liver breaks down these medications. This could change the effects and side effects of these medications.  Medications changed by the liver (Cytochrome P450 3A4 (CYP3A4) substrates) Some medications are changed and broken down by the liver. CBD might change how quickly the liver breaks down these medications. This could change the effects and side effects of these medications.  Medications changed by the liver (Glucuronidated drugs) Some medications are changed and broken down by the liver. CBD might change how quickly the liver breaks down these medications. This could change the effects and side effects of these medications.  Medications that decrease the breakdown of other medications by the liver (Cytochrome P450 2C19 (CYP2C19) inhibitors) CBD is changed and broken down by the liver.  Some drugs decrease how quickly the liver changes and breaks down CBD. This could change the effects and side effects of CBD.  Medications that decrease the breakdown of other medications in the liver (Cytochrome P450 3A4 (CYP3A4) inhibitors) CBD is changed and broken down by the liver. Some drugs decrease how quickly the liver changes and breaks down CBD. This could change the effects   and side effects of CBD.  Medications that increase breakdown of other medications by the liver (Cytochrome P450 3A4 (CYP3A4) inducers) CBD is changed and broken down by the liver. Some drugs increase how quickly the liver changes and breaks down CBD. This could change the effects and side effects of CBD.  Medications that increase the breakdown of other medications by the liver (Cytochrome P450 2C19 (CYP2C19) inducers) CBD is changed and broken down by the liver. Some drugs increase how quickly the liver changes and breaks down CBD. This could change the effects and side effects of CBD.  Methadone (Dolophine) Methadone is broken down by the liver. CBD might decrease how quickly the liver breaks down methadone. Taking cannabidiol along with methadone might increase the effects and side effects of methadone.  Rufinamide (Banzel) Rufinamide is changed and broken down by the body. CBD might decrease how quickly the body breaks down rufinamide. This might increase levels of rufinamide in the body by a small amount.  Sedative medications (CNS depressants) CBD might cause sleepiness and slowed breathing. Some medications, called sedatives, can also cause sleepiness and slowed breathing. Taking CBD with sedative medications might cause breathing problems and/or too much sleepiness.  Sirolimus (Rapamune) Sirolimus is changed and broken down by the body. CBD might decrease how quickly the body breaks down sirolimus. This might increase levels of sirolimus in the body.  Stiripentol (Diacomit) Stiripentol is changed and  broken down by the body. CBD might decrease how quickly the body breaks down stiripentol. This might increase levels of stiripentol in the body and increase its side effects.  Tacrolimus (Prograf) Tacrolimus is changed and broken down by the body. CBD might decrease how quickly the body breaks down tacrolimus. This might increase levels of tacrolimus in the body.  Tamoxifen (Soltamox) Tamoxifen is changed and broken down by the body. CBD might affect how quickly the body breaks down tamoxifen. This might affect levels of tamoxifen in the body.  Topiramate (Topamax) Topiramate is changed and broken down by the body. CBD might decrease how quickly the body breaks down topiramate. This might increase levels of topiramate in the body by a small amount.  Valproate Valproic acid can cause liver injury. Taking cannabidiol with valproic acid might increase the chance of liver injury. CBD and/or valproic acid might need to be stopped, or the dose might need to be reduced.  Warfarin (Coumadin) CBD might increase levels of warfarin, which can increase the risk for bleeding. CBD and/or warfarin might need to be stopped, or the dose might need to be reduced.  Zonisamide Zonisamide is changed and broken down by the body. CBD might decrease how quickly the body breaks down zonisamide. This might increase levels of zonisamide in the body by a small amount. (Last update: 11/28/2021) ____________________________________________________________________________________________  ____________________________________________________________________________________________  Drug Holidays (Slow)  What is a "Drug Holiday"? Drug Holiday: is the name given to the period of time during which a patient stops taking a medication(s) for the purpose of eliminating tolerance to the drug.  Benefits Improved effectiveness of opioids. Decreased opioid dose needed to achieve benefits. Improved pain with lesser  dose.  What is tolerance? Tolerance: is the progressive decreased in effectiveness of a drug due to its repetitive use. With repetitive use, the body gets use to the medication and as a consequence, it loses its effectiveness. This is a common problem seen with opioid pain medications. As a result, a larger dose of the drug is needed to achieve the same effect that   used to be obtained with a smaller dose.  How long should a "Drug Holiday" last? You should stay off of the pain medicine for at least 14 consecutive days. (2 weeks)  Should I stop the medicine "cold turkey"? No. You should always coordinate with your Pain Specialist so that he/she can provide you with the correct medication dose to make the transition as smoothly as possible.  How do I stop the medicine? Slowly. You will be instructed to decrease the daily amount of pills that you take by one (1) pill every seven (7) days. This is called a "slow downward taper" of your dose. For example: if you normally take four (4) pills per day, you will be asked to drop this dose to three (3) pills per day for seven (7) days, then to two (2) pills per day for seven (7) days, then to one (1) per day for seven (7) days, and at the end of those last seven (7) days, this is when the "Drug Holiday" would start.   Will I have withdrawals? By doing a "slow downward taper" like this one, it is unlikely that you will experience any significant withdrawal symptoms. Typically, what triggers withdrawals is the sudden stop of a high dose opioid therapy. Withdrawals can usually be avoided by slowly decreasing the dose over a prolonged period of time. If you do not follow these instructions and decide to stop your medication abruptly, withdrawals may be possible.  What are withdrawals? Withdrawals: refers to the wide range of symptoms that occur after stopping or dramatically reducing opiate drugs after heavy and prolonged use. Withdrawal symptoms do not occur to  patients that use low dose opioids, or those who take the medication sporadically. Contrary to benzodiazepine (example: Valium, Xanax, etc.) or alcohol withdrawals ("Delirium Tremens"), opioid withdrawals are not lethal. Withdrawals are the physical manifestation of the body getting rid of the excess receptors.  Expected Symptoms Early symptoms of withdrawal may include: Agitation Anxiety Muscle aches Increased tearing Insomnia Runny nose Sweating Yawning  Late symptoms of withdrawal may include: Abdominal cramping Diarrhea Dilated pupils Goose bumps Nausea Vomiting  Will I experience withdrawals? Due to the slow nature of the taper, it is very unlikely that you will experience any.  What is a slow taper? Taper: refers to the gradual decrease in dose.  (Last update: 04/19/2020) ____________________________________________________________________________________________    

## 2022-03-04 NOTE — Progress Notes (Signed)
PROVIDER NOTE: Information contained herein reflects review and annotations entered in association with encounter. Interpretation of such information and data should be left to medically-trained personnel. Information provided to patient can be located elsewhere in the medical record under "Patient Instructions". Document created using STT-dictation technology, any transcriptional errors that may result from process are unintentional.    Patient: Dana Bradley  Service Category: E/M  Provider: Gaspar Cola, MD  DOB: 1971-09-20  DOS: 03/04/2022  Specialty: Interventional Pain Management  MRN: [id number]  Setting: Ambulatory outpatient  PCP: Delsa Grana, PA-C  Type: Established Patient    Referring Provider: Delsa Grana, PA-C  Location: Office  Delivery: Face-to-face     HPI  [name], a 51 y.o. year old female, is here today because of her Chronic pain syndrome [G89.4]. [name] primary complain today is Back Pain (lower) Last encounter: My last encounter with her was on 01/28/2022. Pertinent problems: [name] has Cervico-occipital neuralgia; DDD (degenerative disc disease), lumbosacral; Chronic pain of multiple joints; DDD (degenerative disc disease), lumbar; Fibromyalgia; Migraine without aura and with status migrainosus, not intractable; Chronic lower extremity cramps (Bilateral) (R>L); Chronic pain syndrome; Neurogenic pain; Chronic low back pain (1ry area of Pain) (Bilateral) (R>L) (midline); Chronic upper back pain (2ry area of Pain) (Bilateral) (L>R); Chronic abdominal pain (Right lower quadrant); Thoracic radiculitis (Bilateral: T10, T11); Chronic occipital neuralgia (3ry area of Pain) (Bilateral) (L>R); Chronic neck pain; Chronic cervical radicular pain (Bilateral) (L>R); Chronic shoulder blade pain (Bilateral) (L>R); Chronic upper extremity pain (Bilateral) (R>L); Chronic knee pain (Bilateral) (R>L); Chronic ankle pain (Bilateral); Cervical spondylosis with myelopathy and  radiculopathy; Nephrolithiasis; Plantar fasciitis of left foot; Cervicogenic headache; Bilateral leg edema; Chronic hip pain (Left); Chronic sacroiliac joint pain (Left); Lumbar facet syndrome (Bilateral) (L>R); Lumbar radiculitis (Left); Lumbar spondylosis; Migraine headache; Muscle spasticity; Osteoarthritis of shoulder (Bilateral); Lumbar L1-2 disc protrusion (Right); Myofascial pain syndrome (Left) (trapezius muscle); Trigger point of shoulder region (Left); Chronic fatigue syndrome with fibromyalgia; Occipital headache; Trigger point of neck (Left); Malar rash; Chronic migraine without aura, with intractable migraine, so stated, with status migrainosus; Weakness of leg (Left); Difficulty walking; Migraine with aura and with status migrainosus, not intractable; Cervico-occipital neuralgia (Left); DDD (degenerative disc disease), cervical; Cervical facet syndrome (Bilateral) (L>R); DDD (degenerative disc disease), thoracic; Osteoarthritis of hip (Left); Chronic groin pain (Bilateral) (L>R); Chronic hip pain (Bilateral) (L>R); Somatic dysfunction of sacroiliac joint (Bilateral); Other specified dorsopathies, sacral and sacrococcygeal region; Intractable migraine with aura without status migrainosus; Chronic lower extremity pain (Bilateral); Lumbar radiculitis (Right); Neurogenic urinary incontinence; Other intervertebral disc degeneration, lumbar region; Spinal stenosis of lumbosacral region; Hyperalgesia; Abnormal MRI, lumbar spine (12/27/2021); Chronic midline thoracic back pain; Abnormal MRI, thoracic spine (08/02/2019); Thoracic spinal stenosis (T7-8); Prolapse of thoracic disc with radiculopathy (T7-8); Spasm of muscle of lower back; Cervicalgia; Abnormal MRI, cervical spine (02/14/2021); Lateral epicondylitis, left elbow; Neurogenic bladder; and Urinary and fecal incontinence on their pertinent problem list. Pain Assessment: Severity of Chronic pain is reported as a 5 /10. Location: Back Lower/both legs to  the feet. Onset: More than a month ago. Quality: Pressure, Aching, Burning. Timing: Constant. Modifying factor(s): reclining, Hydrocodone. Vitals:  height is $RemoveB'5\' 3"'RvqbtDAi$  (1.6 m) and weight is 226 lb (102.5 kg). Her temporal temperature is 97.3 F (36.3 C) (abnormal). Her blood pressure is 128/90 and her pulse is 101 (abnormal). Her respiration is 18 and oxygen saturation is 98%.   Reason for encounter: medication management.  The patient indicates doing well with the current medication  regimen. No adverse reactions or side effects reported to the medications.  Today we have received a referral from [facility] requesting that we evaluate her for a possible spinal cord stimulator implant.  The patient has been experiencing both lower extremity and low back pain.  From that standpoint, she may benefit from a trial.  The biggest concern that I have is that she does have a thoracic MRI done on 08/02/2019 indicating that she does have some moderate right paracentral disc protrusion at the T7-8 with some resultant mild spinal stenosis at that level.  Typically this is where we placed the electrode tips for the spinal cord stimulator and therefore the patient has been warned that we may encounter some problems with the percutaneous placement of the device.  However, should she get some benefit from it, I would suggest a limited laminotomy/laminectomy at that level not only for the purpose of decompressing this mild spinal stenosis but also to place the spinal cord stimulator paddles.  If by any chance we do encounter some restriction to movement of the electrodes, we may need to repeat that thoracic MRI.  Today we have taken care of of referring her to a medical psychologist for evaluation and I have provided her with some written information with regards to the neurostimulator implant.  RTCB: 06/02/2022 Nonopioid transferred 11/08/2020: Lyrica, baclofen  Pharmacotherapy Assessment  Analgesic: Hydrocodone/APAP 7.5/325, 1 tab  PO QD. she appears to be using less than 10 pills/month. MME/day: 3.75 mg/day.   Monitoring: [place] PMP: PDMP reviewed during this encounter.       Pharmacotherapy: No side-effects or adverse reactions reported. Compliance: No problems identified. Effectiveness: Clinically acceptable.  Landis Martins, RN  03/04/2022  2:45 PM  Sign when Signing Visit Nursing Pain Medication Assessment:  Safety precautions to be maintained throughout the outpatient stay will include: orient to surroundings, keep bed in low position, maintain call bell within reach at all times, provide assistance with transfer out of bed and ambulation.  Medication Inspection Compliance: Pill count conducted under aseptic conditions, in front of the patient. Neither the pills nor the bottle was removed from the patient's sight at any time. Once count was completed pills were immediately returned to the patient in their original bottle.  Medication: Hydrocodone/APAP Pill/Patch Count:  14 of 60 pills remain Pill/Patch Appearance: Markings consistent with prescribed medication Bottle Appearance: Standard pharmacy container. Clearly labeled. Filled Date: 05  / 13 / 2023 Last Medication intake:  Today    UDS:  Summary  Date Value Ref Range Status  01/28/2022 Note  Final    Comment:    ==================================================================== [employer] Select 13 (MW) ==================================================================== Test                             Result       Flag       Units  Drug Present and Declared for Prescription Verification   Hydromorphone                  38           EXPECTED   ng/mg creat    Hydromorphone may be administered as a scheduled prescription    medication; it is also an expected metabolite of hydrocodone.  Drug Absent but Declared for Prescription Verification   Lorazepam                      Not Detected UNEXPECTED  ng/mg creat   Hydrocodone                    Not  Detected UNEXPECTED ng/mg creat    Hydrocodone is almost always present in patients taking this drug    consistently. Absence of hydrocodone could be due to lapse of time    since the last dose or unusual pharmacokinetics (rapid metabolism).    Butalbital                     Not Detected UNEXPECTED ==================================================================== Test                      Result    Flag   Units      Ref Range   Creatinine              135              mg/dL      >=20 ==================================================================== Declared Medications:  The flagging and interpretation on this report are based on the  following declared medications.  Unexpected results may arise from  inaccuracies in the declared medications.   **Note: The testing scope of this panel includes these medications:   Butalbital  Hydrocodone (Norco)  Lorazepam (Ativan)   **Note: The testing scope of this panel does not include the  following reported medications:   Acetaminophen  Acetaminophen (Norco)  Amitriptyline (Elavil)  Atorvastatin (Lipitor)  Baclofen (Lioresal)  Caffeine  Carbamazepine (Tegretol)  Diclofenac (Voltaren)  Dicyclomine (Bentyl)  Dihydroergotamine (Migranal)  Meclizine (Antivert)  Medroxyprogesterone (Provera)  Metformin  Metoprolol  Omeprazole  Ondansetron (Zofran)  Pancrelipase (Creon)  Pregabalin (Lyrica)  Rimegepant (Nurtec)  Vitamin B12 ==================================================================== For clinical consultation, please call [phone]. ====================================================================      ROS  Constitutional: Denies any fever or chills Gastrointestinal: No reported hemesis, hematochezia, vomiting, or acute GI distress Musculoskeletal: Denies any acute onset joint swelling, redness, loss of ROM, or weakness Neurological: No reported episodes of acute onset apraxia, aphasia, dysarthria, agnosia,  amnesia, paralysis, loss of coordination, or loss of consciousness  Medication Review  Eptinezumab-jjmr, HYDROcodone-acetaminophen, LORazepam, Needles & Syringes, Rimegepant Sulfate, Ubrogepant, Valerian, amitriptyline, atorvastatin, baclofen, butalbital-acetaminophen-caffeine, carbamazepine, cyanocobalamin, diclofenac Sodium, dicyclomine, lipase/protease/amylase, meclizine, medroxyPROGESTERone, metFORMIN, metoprolol tartrate, omeprazole, ondansetron, pregabalin, rizatriptan, and scopolamine  History Review  Allergy: [name] is allergic to aspirin, cephalexin, cymbalta [duloxetine hcl], depakote [divalproex sodium], gadolinium derivatives, haloperidol, iodinated contrast media, meperidine, metoclopramide, morphine, penicillins, prochlorperazine, tramadol hcl, trazodone, meloxicam, neomycin-bacitracin zn-polymyx, tomato, other, shellfish allergy, shellfish-derived products, bacitracin-neomycin-polymyxin, cephalosporins, ibuprofen, latex, nsaids, sulfa antibiotics, and sulfonamide derivatives. Drug: [name]  reports no history of drug use. Alcohol:  reports no history of alcohol use. Tobacco:  reports that she quit smoking about 29 years ago. Her smoking use included cigarettes. She has a 12.00 pack-year smoking history. She has never used smokeless tobacco. Social: [name]  reports that she quit smoking about 29 years ago. Her smoking use included cigarettes. She has a 12.00 pack-year smoking history. She has never used smokeless tobacco. She reports that she does not drink alcohol and does not use drugs. Medical:  has a past medical history of Acute postoperative pain (04/07/2017), Anxiety, Bursitis, Chronic fatigue (12/12/2017), Chronic fatigue syndrome, Colitis (2021), Edema leg (05/02/2015), Fibromyalgia, GERD (gastroesophageal reflux disease), IBS (irritable bowel syndrome), Knee pain, bilateral (12/21/2008), Lumbar discitis, Migraines, Osteoarthritis, Right hand pain (04/10/2015), Sleep apnea, Spinal  stenosis, SVT (supraventricular tachycardia) ([place]), Vertigo, and Vitamin D deficiency (05/01/2016).  Surgical: [name]  has a past surgical history that includes Ablation; Tubal ligation (10/01/1999); Cardiac catheterization; Knee arthroscopy; Colonoscopy with propofol (N/A, 05/17/2015); Esophagogastroduodenoscopy (N/A, 05/17/2015); spg; Tibial Tubercle Bypass (Right, 1998); Colonoscopy with propofol (N/A, 03/20/2020); and Breast biopsy (Left). Family: family history includes Alcohol abuse in her father; Alzheimer's disease in her paternal grandmother and another family member; Aneurysm in her maternal grandfather; Anxiety disorder in her father, mother, sister, and sister; Arthritis in her maternal grandmother; Bipolar disorder in her sister; Breast cancer (age of onset: 55) in her maternal grandmother; COPD in her paternal grandfather; Cancer in her mother; Cancer (age of onset: 54) in her maternal grandmother; Depression in her father, mother, sister, and sister; Diabetes in her sister; Heart attack in her paternal grandfather; Heart disease in her father, maternal grandfather, and paternal grandfather; Hyperlipidemia in her maternal grandmother, mother, and sister; Hypertension in her father, maternal grandfather, mother, paternal grandfather, sister, and sister; Migraines in her daughter, daughter, mother, sister, sister, and son; Other in her daughter; Polycystic ovary syndrome in her sister; Stroke in her father; Thyroid disease in her maternal grandmother.  Laboratory Chemistry Profile   Renal Lab Results  Component Value Date   BUN 9 12/27/2021   CREATININE 0.68 12/27/2021   BCR 13 12/27/2021   GFRAA >60 02/22/2020   GFRNONAA >60 07/06/2021    Hepatic Lab Results  Component Value Date   AST 28 07/06/2021   ALT 37 07/06/2021   ALBUMIN 4.2 07/06/2021   ALKPHOS 109 07/06/2021   HCVAB NON REACTIVE 09/21/2019    Electrolytes Lab Results  Component Value Date   NA 140 12/27/2021   K 4.2  12/27/2021   CL 103 12/27/2021   CALCIUM 9.7 12/27/2021   MG 2.1 04/01/2017    Bone Lab Results  Component Value Date   VD25OH 16.49 (L) 02/22/2020   25OHVITD1 20 (L) 04/01/2017   25OHVITD2 5.4 04/01/2017   25OHVITD3 15 04/01/2017    Inflammation (CRP: Acute Phase) (ESR: Chronic Phase) Lab Results  Component Value Date   CRP 6.0 07/30/2018   ESRSEDRATE 17 07/30/2018         Note: Above Lab results reviewed.  Recent Imaging Review  MR Lumbar Spine Wo Contrast CLINICAL DATA:  Lumbar radiculopathy, symptoms persist with greater than 6 weeks of treatment. Low back pain. Cauda equina syndrome suspected. Pain extends to both legs with numbness. Symptoms worsening over the last 4 months.  EXAM: MRI LUMBAR SPINE WITHOUT CONTRAST  TECHNIQUE: Multiplanar, multisequence MR imaging of the lumbar spine was performed. No intravenous contrast was administered.  COMPARISON:  03/15/2020  FINDINGS: Segmentation: 5 lumbar type vertebral bodies as numbered previously.  Alignment:  Straightening of the normal lumbar lordosis.  Vertebrae:  No fracture or focal bone lesion.  Conus medullaris and cauda equina: Conus extends to the L1 level. Conus and cauda equina appear normal.  Paraspinal and other soft tissues: Negative  Disc levels:  T12-L1: Small central disc bulge, similar to the previous study. No stenosis.  L1-2: Central to slightly right-sided disc herniation indents the thecal sac. This is similar to the previous study. No apparent neural compression.  L2-3: Normal interspace.  L3-4: Minimal desiccation of the disc. Right foraminal to extraforaminal disc herniation is newly seen and could affect the right L3 nerve.  L4-5: Desiccation and mild bulging of the disc. No change. No compressive stenosis.  L5-S1: Normal interspace.  IMPRESSION: Newly seen right lateral foraminal to extraforaminal disc herniation at L3-4 which could affect the  right L3  nerve.  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 Indentation of the thecal sac but no apparent neural compression or change since 2021.  Electronically Signed   By: Nelson Chimes M.D.   On: 12/27/2021 12:42 Note: Reviewed        Physical Exam  General appearance: Well nourished, well developed, and well hydrated. In no apparent acute distress Mental status: Alert, oriented x 3 (person, place, & time)       Respiratory: No evidence of acute respiratory distress Eyes: PERLA Vitals: BP 128/90   Pulse (!) 101   Temp (!) 97.3 F (36.3 C) (Temporal)   Resp 18   Ht $R'5\' 3"'xQ$  (1.6 m)   Wt 226 lb (102.5 kg)   SpO2 98%   BMI 40.03 kg/m  BMI: Estimated body mass index is 40.03 kg/m as calculated from the following:   Height as of this encounter: $RemoveBeforeD'5\' 3"'vYeFdYEOhMZSQE$  (1.6 m).   Weight as of this encounter: 226 lb (102.5 kg). Ideal: Ideal body weight: 52.4 kg (115 lb 8.3 oz) Adjusted ideal body weight: 72.4 kg (159 lb 11.4 oz)  Assessment   Diagnosis Status  1. Chronic pain syndrome   2. Chronic low back pain (1ry area of Pain) (Bilateral) (R>L) (midline)   3. Chronic upper back pain (2ry area of Pain) (Bilateral) (L>R)   4. Chronic occipital neuralgia (3ry area of Pain) (Bilateral) (L>R)   5. Osteoarthritis of shoulder (Bilateral)   6. Muscle spasticity   7. Pharmacologic therapy   8. Encounter for medication management   9. Chronic use of opiate for therapeutic purpose   10. Encounter for chronic pain management    Controlled Controlled Controlled   Updated Problems: No problems updated.  Plan of Care  Problem-specific:  No problem-specific Assessment & Plan notes found for this encounter.  [name] has a current medication list which includes the following long-term medication(s): amitriptyline, atorvastatin, baclofen, carbamazepine, hydrocodone-acetaminophen, [START ON 04/03/2022] hydrocodone-acetaminophen, [START ON 05/03/2022]  hydrocodone-acetaminophen, medroxyprogesterone, metformin, metoprolol tartrate, needles & syringes, omeprazole, pregabalin, rizatriptan, diclofenac sodium, and dicyclomine.  Pharmacotherapy (Medications Ordered): Meds ordered this encounter  Medications   HYDROcodone-acetaminophen (NORCO) 7.5-325 MG tablet    Sig: Take 1 tablet by mouth 2 (two) times daily as needed for severe pain. Must last 30 days    Dispense:  45 tablet    Refill:  0    DO NOT: delete (not duplicate); no partial-fill (will deny script to complete), no refill request (F/U required). DISPENSE: 1 day early if closed on fill date. WARN: No CNS-depressants within 8 hrs of med.   HYDROcodone-acetaminophen (NORCO) 7.5-325 MG tablet    Sig: Take 1 tablet by mouth 2 (two) times daily as needed for severe pain. Must last 30 days    Dispense:  45 tablet    Refill:  0    DO NOT: delete (not duplicate); no partial-fill (will deny script to complete), no refill request (F/U required). DISPENSE: 1 day early if closed on fill date. WARN: No CNS-depressants within 8 hrs of med.   HYDROcodone-acetaminophen (NORCO) 7.5-325 MG tablet    Sig: Take 1 tablet by mouth 2 (two) times daily as needed for severe pain. Must last 30 days    Dispense:  45 tablet    Refill:  0    DO NOT: delete (not duplicate); no partial-fill (will deny script to complete), no refill request (F/U required). DISPENSE: 1 day early if closed on  fill date. WARN: No CNS-depressants within 8 hrs of med.   Orders:  Orders Placed This Encounter  Procedures   Ambulatory referral to Psychology    Referral Priority:   Routine    Referral Type:   Psychiatric    Referral Reason:   Specialty Services Required    Requested Specialty:   Psychology    Number of Visits Requested:   1   Follow-up plan:   Return in about 3 months (around 06/02/2022) for Eval-day (M,W), (F2F), (MM).     Interventional Therapies  Risk  Complexity Considerations:   Estimated body mass index is  38.97 kg/m as calculated from the following:   Height as of 02/13/21: $RemoveBef'5\' 3"'stURibPdyo$  (1.6 m).   Weight as of 02/13/21: 220 lb (99.8 kg). Allergy to contrast. Allergy to latex. Allergy to NSAIDs. History of vasovagal episodes with spinal manipulation.   Planned  Pending:   Referral to Med Psych for SCS implant evaluation   Under consideration:   It would appear that this last therapeutic left C7-T1 cervical ESI # 3 (02/13/2021) did not provide her with any significant relief of the pain.  However, her very first cervical epidural steroid injection was done on 09/11/2016 and again caused a flareup of her pain.  Follow-up assessment indicated 80/60/70/> 50.  The procedure was then repeated on 10/30/2016 and although the patient did not keep up with the follow-up on that procedure, it would seem that her pain got under control and did not need any further interventions in the cervical region until 12/25/2017 when she had a left occipital nerve (Left) C2/TON RFA.  Unfortunately, the patient is rather noncompliant with follow-up evaluations after diagnostic and therapeutic procedures and therefore the information collected on those results is not available to contribute towards obtaining better results. Therapeutic left cervical ESI #4  Therapeutic right L4-5 LESI #2    Completed:   Neurosurgical referral for decompressive laminectomy and/or discectomy Therapeutic left C7-T1 cervical ESI x2 (10/30/2016)  Therapeutic bilateral lumbar facet block x1 (01/06/2017)  Therapeutic left SI joint block x2 (08/03/2019)  Therapeutic right L1-2 LESI x1 (12/16/2017)  Therapeutic left L1-2 LESI x1 (04/07/2017)  Therapeutic bilateral L2 TFESI x2 (11/09/2020)  Therapeutic left L4-5 LESI x1 (09/07/2019)  Therapeutic left trapezius muscle TPI/MNB x3 (03/19/2018)  Therapeutic right trapezius muscle trigger point injection x1 (11/18/2017)  Therapeutic left occipital nerve RFA x1 (12/25/2017)  Therapeutic left C2 + TON RFA x1 (12/25/2017)   Therapeutic left caudal ESI x1 (09/28/2019)  Therapeutic right T8-9 thoracic ESI x1 (11/09/2020)    Therapeutic  Palliative (PRN) options:   Diagnostic left SI joint block #2  Diagnostic/therapeutic left IA hip joint injection #2  Diagnostic bilateral lumbar facet block #2  Palliative left trapezius muscle MNB #4  Palliative right trapezius muscle MNB #2  Palliative left L1-2 LESI #2  Palliative right L1-2 LESI #2  Palliative left CESI #3  Palliative left greater occipital NB #3  Palliative left C2 + TON NB #2  Palliative left GON + C2 + TON RFA #2 (last done 12/25/2017) Diagnostic/therapeutic bilateral L2 TFESI #2 (done on 03/07/2020) (100/100/100/>50)    Recent Visits Date Type Provider Dept  01/28/22 Office Visit Milinda Pointer, MD [facility]  Showing recent visits within past 90 days and meeting all other requirements Today's Visits Date Type Provider Dept  03/04/22 Office Visit Milinda Pointer, MD [facility]  Showing today's visits and meeting all other requirements Future Appointments Date Type Provider Dept  05/27/22 Appointment  Milinda Pointer, MD [facility]  Showing future appointments within next 90 days and meeting all other requirements  I discussed the assessment and treatment plan with the patient. The patient was provided an opportunity to ask questions and all were answered. The patient agreed with the plan and demonstrated an understanding of the instructions.  Patient advised to call back or seek an in-person evaluation if the symptoms or condition worsens.  Duration of encounter: 30 minutes.  Note by: Gaspar Cola, MD Date: 03/04/2022; Time: 3:21 PM

## 2022-03-08 ENCOUNTER — Other Ambulatory Visit (HOSPITAL_COMMUNITY): Payer: Self-pay

## 2022-03-11 ENCOUNTER — Other Ambulatory Visit (HOSPITAL_COMMUNITY): Payer: Self-pay

## 2022-03-12 ENCOUNTER — Other Ambulatory Visit (HOSPITAL_COMMUNITY): Payer: Self-pay

## 2022-03-12 ENCOUNTER — Ambulatory Visit (INDEPENDENT_AMBULATORY_CARE_PROVIDER_SITE_OTHER): Payer: Medicaid Other

## 2022-03-12 VITALS — BP 108/70 | HR 91 | Temp 98.5°F | Resp 18 | Ht 63.0 in | Wt 229.6 lb

## 2022-03-12 DIAGNOSIS — G43711 Chronic migraine without aura, intractable, with status migrainosus: Secondary | ICD-10-CM

## 2022-03-12 MED ORDER — SODIUM CHLORIDE 0.9 % IV SOLN
100.0000 mg | Freq: Once | INTRAVENOUS | Status: AC
  Administered 2022-03-12: 100 mg via INTRAVENOUS
  Filled 2022-03-12: qty 1

## 2022-03-12 NOTE — Progress Notes (Signed)
Diagnosis: Migraines  Provider:  Marshell Garfinkel, MD  Procedure: Infusion  IV Type: Peripheral, IV Location: L Antecubital  Vyepti (Eptinezumab-jjmr), Dose: 100 mg  Infusion Start Time: 1848  Infusion Stop Time: 5927  Post Infusion IV Care: Peripheral IV Discontinued  Discharge: Condition: Good, Destination: Home . AVS provided to patient.   Performed by:  Koren Shiver, RN

## 2022-03-13 DIAGNOSIS — G4733 Obstructive sleep apnea (adult) (pediatric): Secondary | ICD-10-CM | POA: Diagnosis not present

## 2022-03-14 ENCOUNTER — Other Ambulatory Visit: Payer: Self-pay | Admitting: Neurosurgery

## 2022-03-14 DIAGNOSIS — G8929 Other chronic pain: Secondary | ICD-10-CM

## 2022-03-21 ENCOUNTER — Ambulatory Visit: Payer: Medicaid Other | Admitting: Pulmonary Disease

## 2022-03-21 ENCOUNTER — Encounter: Payer: Self-pay | Admitting: Pulmonary Disease

## 2022-03-21 VITALS — BP 126/78 | HR 107 | Temp 97.8°F | Ht 63.0 in | Wt 226.0 lb

## 2022-03-21 DIAGNOSIS — G478 Other sleep disorders: Secondary | ICD-10-CM | POA: Diagnosis not present

## 2022-03-21 DIAGNOSIS — G4719 Other hypersomnia: Secondary | ICD-10-CM

## 2022-03-21 DIAGNOSIS — G4733 Obstructive sleep apnea (adult) (pediatric): Secondary | ICD-10-CM | POA: Diagnosis not present

## 2022-03-21 NOTE — Progress Notes (Signed)
Dana Bradley, Critical Care, and Sleep Medicine  Chief Complaint  Patient presents with   Follow-up    Wearing cpap avg 8-10hr nightly--pressure and mask is okay.     Past Surgical History:  She  has a past surgical history that includes Ablation; Tubal ligation (10/01/1999); Cardiac catheterization; Knee arthroscopy; Colonoscopy with propofol (N/A, 05/17/2015); Esophagogastroduodenoscopy (N/A, 05/17/2015); spg; Tibial Tubercle Bypass (Right, 1998); Colonoscopy with propofol (N/A, 03/20/2020); and Breast biopsy (Left).  Past Medical History:  Anxiety, Chronic fatigue, Fibromyalgia, GERD, IBS, Migraine headache, OA, Spinal stenosis, SVT, Vertigo, Vit D deficiency  Constitutional:  BP 126/78 (BP Location: Left Arm, Cuff Size: Large)   Pulse (!) 107   Temp 97.8 F (36.6 C) (Temporal)   Ht 5\' 3"  (1.6 m)   Wt 226 lb (102.5 kg)   SpO2 99%   BMI 40.03 kg/m   Brief Summary:  Dana Bradley is a 51 y.o. female with obstructive sleep apnea.      Subjective:   She has long standing history of sleep apnea.  She most recently did a sleep study on 07/17/17 which showed severe sleep apnea.  She got a new CPAP machine in March 2023.  No issues with mask fit or pressure setting.  She has persistent daytime sleepiness.  She limits her driving because of this.  She falls asleep easily when sitting quiet.  She has episodes of sleep paralysis.  She feels she is awake but in a dream sometimes when he is going to sleep or waking up.  She feels like she immediately goes into dream sleep when she falls asleep.  She has more frequent migraine headaches.  Physical Exam:   Appearance - well kempt   ENMT - no sinus tenderness, no oral exudate, no LAN, Mallampati 3 airway, no stridor  Respiratory - equal breath sounds bilaterally, no wheezing or rales  CV - s1s2 regular rate and rhythm, no murmurs  Ext - no clubbing, no edema  Skin - no rashes  Psych - normal mood and affect   Sleep  Tests:  PSG 07/17/17 >> AHI 34.8, SpO2 low 82% Auto CPAP 02/17/22 to 03/18/22 >> used on 30 of 30 nights with average 9 hrs 10 min.  Average AHI 2.6 with median CPAP 11 and 95 th percentile CPAP 14 cm H2O  Cardiac Tests:  Echo 12/12/17 >> EF 60 to 65%  Social History:  She  reports that she quit smoking about 29 years ago. Her smoking use included cigarettes. She has a 6.00 pack-year smoking history. She has never used smokeless tobacco. She reports that she does not drink alcohol and does not use drugs.  Family History:  Her family history includes Alcohol abuse in her father; Alzheimer's disease in her paternal grandmother and another family member; Aneurysm in her maternal grandfather; Anxiety disorder in her father, mother, sister, and sister; Arthritis in her maternal grandmother; Bipolar disorder in her sister; Breast cancer (age of onset: 11) in her maternal grandmother; COPD in her paternal grandfather; Cancer in her mother; Cancer (age of onset: 62) in her maternal grandmother; Depression in her father, mother, sister, and sister; Diabetes in her sister; Heart attack in her paternal grandfather; Heart disease in her father, maternal grandfather, and paternal grandfather; Hyperlipidemia in her maternal grandmother, mother, and sister; Hypertension in her father, maternal grandfather, mother, paternal grandfather, sister, and sister; Migraines in her daughter, daughter, mother, sister, sister, and son; Other in her daughter; Polycystic ovary syndrome in her sister; Stroke in her  father; Thyroid disease in her maternal grandmother.    Discussion:  She has obstructive sleep apnea and has been compliant with CPAP therapy.  She has persistent daytime sleepiness.  This is associated with sleep paralysis and hypnogogic/hypnopompic hallucinations.  I am concerned she could also have narcolepsy.    Assessment/Plan:   Obstructive sleep apnea. - she is compliant with CPAP and reports benefit from  therapy - she uses [employer] for her DME  - most recent CPAP ordered on 12/13/21 - continue auto CPAP 5 to 15 cm H2O  Excessive daytime sleepiness with sleep paralysis and hypnogogic/hypnopompic hallucinations. - will arrange for CPAP titration study followed by multiple sleep latency test - unfortunately, I do not think she can safely come off of her medications that could impact sleep staging   Time Spent Involved in Patient Care on Day of Examination:  47 minutes  Follow up:   Patient Instructions  Will arrange for CPAP titration study followed by a multiple sleep latency test.  Will call to schedule follow up after sleep studies done.  Medication List:   Allergies as of 03/21/2022       Reactions   Aspirin Swelling   Cephalexin Rash   Cymbalta [duloxetine Hcl] Other (See Comments)   Suicidal ideations and has homicidal thoughts per patient   Depakote [divalproex Sodium] Shortness Of Breath   W/ n/v   Gadolinium Derivatives    Pt was unable to breath Other reaction(s): Other (See Comments) Pt was unable to breath   Haloperidol Shortness Of Breath   W/ n/v   Iodinated Contrast Media Other (See Comments), Shortness Of Breath   Other reaction(s): Other (See Comments)   Meperidine Nausea And Vomiting   Patient projectile vomits and usually result in ER Other reaction(s): Vomiting   Metoclopramide Shortness Of Breath   Can't breath, wheezes Other reaction(s): Other (See Comments)   Morphine    Other reaction(s): Vomiting   Penicillins Rash   Other reaction(s): Unknown   Prochlorperazine Other (See Comments)   Panic attack Other reaction(s): Other (See Comments)   Tramadol Hcl Palpitations   Severely and adversely affects her SVT giving her tachycardias of 150-160 bpm.   Trazodone Shortness Of Breath   Other reaction(s): Other (See Comments)   Meloxicam Other (See Comments)   mouth sores, tingling, blisters in mouth Other reaction(s): Other (See Comments) mout sores,  tingling mouth sores, tingling, blisters in mouth   Neomycin-bacitracin Zn-polymyx Rash   Tomato Hives   Tongue will blister   Other    Other reaction(s): Other (See Comments)   Shellfish Allergy    Other reaction(s): Unknown   Shellfish-derived Products Other (See Comments)   Other reaction(s): Unknown Other reaction(s): Unknown   Bacitracin-neomycin-polymyxin Rash   Cephalosporins Rash   rash   Ibuprofen Other (See Comments), Rash   Blisters in mouth. Blisters in mouth.   Latex Itching   Other reaction(s): Unknown   Nsaids Other (See Comments)   Blisters in mouth; can take 1 ibuprofen 2x a month Other reaction(s): Other (See Comments), Unknown Blisters in mouth; can take 1 ibuprofen 2x a month   Sulfa Antibiotics Rash   rash Other reaction(s): Unknown rash Other reaction(s): Unknown rash rash Other reaction(s): Unknown rash   Sulfonamide Derivatives Rash        Medication List        Accurate as of March 21, 2022 11:42 AM. If you have any questions, ask your nurse or doctor.  STOP taking these medications    Ubrelvy 50 MG Tabs Generic drug: Ubrogepant Stopped by: Coralyn Helling, MD       TAKE these medications    amitriptyline 50 MG tablet Commonly known as: ELAVIL Take 1 tablet (50 mg total) by mouth at bedtime.   atorvastatin 40 MG tablet Commonly known as: LIPITOR TAKE 1 TABLET (40 MG TOTAL) BY MOUTH AT BEDTIME.   Bac 50-325-40 MG tablet Generic drug: butalbital-acetaminophen-caffeine TAKE 1 TABLET BY MOUTH EVERY 8 HOURS AS NEEDED FOR HEADACHE.   baclofen 10 MG tablet Commonly known as: LIORESAL Take 1 tablet (10 mg total) by mouth 2 (two) times daily. Each refill must last 30 days.   carbamazepine 100 MG 12 hr tablet Commonly known as: TEGretol-XR Take 1 tablet (100 mg total) by mouth once daily at bedtime.   Creon 12000-38000 units Cpep capsule Generic drug: lipase/protease/amylase Take 2 capsules by mouth with the first bite  of each meal and take 1 capsule before snacks. (Max of 8 capsules per day).   cyanocobalamin 1000 MCG/ML injection Commonly known as: (VITAMIN B-12) Inject 1 mL (1,000 mcg total) into the muscle once every 30 (thirty) days.   diclofenac Sodium 1 % Gel Commonly known as: VOLTAREN Apply 2 g topically 4 (four) times daily as needed.   dicyclomine 10 MG capsule Commonly known as: Bentyl Take 1 capsule (10 mg total) by mouth 4 (four) times daily as needed (before meals and at bedtime)   HYDROcodone-acetaminophen 7.5-325 MG tablet Commonly known as: Norco Take 1 tablet by mouth 2 (two) times daily as needed for severe pain. Must last 30 days   HYDROcodone-acetaminophen 7.5-325 MG tablet Commonly known as: Norco Take 1 tablet by mouth 2 (two) times daily as needed for severe pain. Must last 30 days Start taking on: April 03, 2022   HYDROcodone-acetaminophen 7.5-325 MG tablet Commonly known as: Norco Take 1 tablet by mouth 2 (two) times daily as needed for severe pain. Must last 30 days Start taking on: May 03, 2022   LORazepam 1 MG tablet Commonly known as: ATIVAN Take 0.5 tablets (0.5 mg total) by mouth 2 (two) times daily as needed for anxiety.   meclizine 12.5 MG tablet Commonly known as: ANTIVERT Take 1 tablet (12.5 mg total) by mouth 3 (three) times daily as needed for dizziness.   medroxyPROGESTERone 5 MG tablet Commonly known as: PROVERA Take 1 tablet (5 mg total) by mouth daily.   metFORMIN 500 MG tablet Commonly known as: GLUCOPHAGE Take 1 tablet (500 mg total) by mouth 2 (two) times daily with a meal.   metoprolol tartrate 50 MG tablet Commonly known as: LOPRESSOR Take 1 and (1/2) tablets (75 mg total) by mouth 2 (two) times daily.   Needles & Syringes Misc For administration of B12 injections   Nurtec 75 MG Tbdp Generic drug: Rimegepant Sulfate Take 1 tablet by mouth once every other day.   omeprazole 40 MG capsule Commonly known as: PRILOSEC Take 1  capsule (40 mg total) by mouth daily.   ondansetron 4 MG tablet Commonly known as: ZOFRAN Take 1 tablet (4 mg total) by mouth every 8 (eight) hours as needed.   pregabalin 150 MG capsule Commonly known as: LYRICA Take 1 capsule (150 mg total) by mouth every 8 (eight) hours.   rizatriptan 10 MG tablet Commonly known as: Maxalt Take 1 tablet (10 mg total) by mouth as needed for migraine. May repeat in 2 hours if needed   scopolamine 1 MG/3DAYS Commonly  known as: TRANSDERM-SCOP Place 1 patch (1.5 mg total) onto the skin every 3 (three) days.   VALERIAN PO Take by mouth as needed. Makes Valerian tea about 3-4 times per week.   Vyepti 100 MG/ML injection Generic drug: Eptinezumab-jjmr Inject 1 mL (100 mg total) into the vein every 3 (three) months.        Signature:  Coralyn Helling, MD [facility] Bradley/Critical Care Pager - [phone] 03/21/2022, 11:42 AM

## 2022-03-21 NOTE — Patient Instructions (Signed)
Will arrange for CPAP titration study followed by a multiple sleep latency test.  Will call to schedule follow up after sleep studies done.

## 2022-03-25 ENCOUNTER — Ambulatory Visit: Payer: Medicaid Other | Admitting: Physical Medicine and Rehabilitation

## 2022-03-26 ENCOUNTER — Other Ambulatory Visit: Payer: Self-pay | Admitting: Physical Medicine and Rehabilitation

## 2022-03-28 ENCOUNTER — Encounter: Payer: Self-pay | Admitting: Physical Medicine and Rehabilitation

## 2022-03-28 ENCOUNTER — Other Ambulatory Visit: Payer: Self-pay | Admitting: Physical Medicine and Rehabilitation

## 2022-03-28 ENCOUNTER — Other Ambulatory Visit (HOSPITAL_COMMUNITY): Payer: Self-pay

## 2022-03-28 MED ORDER — AMITRIPTYLINE HCL 75 MG PO TABS
75.0000 mg | ORAL_TABLET | Freq: Every day | ORAL | 3 refills | Status: DC
Start: 2022-03-28 — End: 2022-10-25

## 2022-03-29 ENCOUNTER — Ambulatory Visit
Admission: RE | Admit: 2022-03-29 | Discharge: 2022-03-29 | Disposition: A | Discharge status: Home / Self Care | Payer: Medicaid Other | Source: Ambulatory Visit | Attending: Neurosurgery | Admitting: Neurosurgery

## 2022-03-29 DIAGNOSIS — G8929 Other chronic pain: Secondary | ICD-10-CM | POA: Diagnosis not present

## 2022-03-29 DIAGNOSIS — M5441 Lumbago with sciatica, right side: Secondary | ICD-10-CM | POA: Diagnosis not present

## 2022-03-29 DIAGNOSIS — M549 Dorsalgia, unspecified: Secondary | ICD-10-CM | POA: Diagnosis not present

## 2022-03-29 DIAGNOSIS — M5442 Lumbago with sciatica, left side: Secondary | ICD-10-CM | POA: Insufficient documentation

## 2022-04-03 ENCOUNTER — Other Ambulatory Visit (HOSPITAL_COMMUNITY): Payer: Self-pay

## 2022-04-03 ENCOUNTER — Encounter: Payer: Self-pay | Admitting: Pulmonary Disease

## 2022-04-03 ENCOUNTER — Telehealth: Payer: Self-pay | Admitting: Pulmonary Disease

## 2022-04-03 NOTE — Telephone Encounter (Signed)
Dr. Sood, please advise. Thanks 

## 2022-04-03 NOTE — Telephone Encounter (Signed)
ATC patient-no answer with no option to leave vm. Line rang for >63mn then rang busy.  Will call back.

## 2022-04-03 NOTE — Telephone Encounter (Signed)
Pt has questions about sleep study. States they want her in at 8:45-4pm and thats not possible for her. Wants to know if we have to do sleep studies together or if they can be separated? Pt is audibly frustrated with our office and sleep center. Please advise.

## 2022-04-03 NOTE — Telephone Encounter (Signed)
I respond to the patient's email regarding this question.

## 2022-04-03 NOTE — Telephone Encounter (Signed)
Spoke with the pt She is asking about sleep studies- CPAP titration and MSLT  Please see her email and advised  I need more info about the sleep studies you prescribed. Do I need to stay awake all night to do the daytime nap study? That's what I had to do last time. Also, do I have to do both studies in one 24 hour period? I can't go in at 21: 45p and stay until 4p the next day like the sleep study scheduler insists I must. The sleep scheduler says you have to contact them about the night study. Thank you for your time

## 2022-04-04 ENCOUNTER — Ambulatory Visit: Payer: Medicaid Other | Admitting: Family Medicine

## 2022-04-04 ENCOUNTER — Encounter: Payer: Self-pay | Admitting: Family Medicine

## 2022-04-04 VITALS — BP 130/80 | HR 100 | Temp 98.4°F | Resp 16 | Ht 63.0 in | Wt 232.4 lb

## 2022-04-04 DIAGNOSIS — F1120 Opioid dependence, uncomplicated: Secondary | ICD-10-CM | POA: Diagnosis not present

## 2022-04-04 DIAGNOSIS — G959 Disease of spinal cord, unspecified: Secondary | ICD-10-CM | POA: Insufficient documentation

## 2022-04-04 DIAGNOSIS — K5900 Constipation, unspecified: Secondary | ICD-10-CM

## 2022-04-04 DIAGNOSIS — F331 Major depressive disorder, recurrent, moderate: Secondary | ICD-10-CM

## 2022-04-04 DIAGNOSIS — Z6841 Body Mass Index (BMI) 40.0 and over, adult: Secondary | ICD-10-CM

## 2022-04-04 DIAGNOSIS — E119 Type 2 diabetes mellitus without complications: Secondary | ICD-10-CM

## 2022-04-04 DIAGNOSIS — I471 Supraventricular tachycardia: Secondary | ICD-10-CM

## 2022-04-04 DIAGNOSIS — F4320 Adjustment disorder, unspecified: Secondary | ICD-10-CM

## 2022-04-04 NOTE — Assessment & Plan Note (Signed)
Managed by cardiology, currently well controlled with metoprolol

## 2022-04-04 NOTE — Assessment & Plan Note (Signed)
With multiple comorbidities including new onset diabetes, hyperlipidemia, chronic pain, anxiety and depression, currently being evaluated for obstructive sleep apnea

## 2022-04-04 NOTE — Progress Notes (Signed)
Name: Mennie Spiller   MRN: [id number]    DOB: [birth year]   Date:04/04/2022       Progress Note  Chief Complaint  Patient presents with   Follow-up   Diabetes     Subjective:   Dorine Duffey is a 51 y.o. female, presents to clinic for DM   DM:   Pt managing DM with 500 mg BID Reports god med compliance Blood sugars 100-180 Denies: Polyuria, polydipsia, vision changes, neuropathy, hypoglycemia Recent pertinent labs: Lab Results  Component Value Date   HGBA1C 7.5 (H) 12/27/2021   HGBA1C 6.8 (A) 06/18/2021   HGBA1C 5.8 (H) 02/22/2020   Lab Results  Component Value Date   LDLCALC 105 (H) 12/27/2021   CREATININE 0.68 12/27/2021   Standard of care and health maintenance: Urine Microalbumin:  due Foot exam:  due DM eye exam:  due ACEI/ARB:  not taking Statin:  taking  Weight gain Wt Readings from Last 15 Encounters:  04/04/22 232 lb 6.4 oz (105.4 kg)  03/21/22 226 lb (102.5 kg)  03/12/22 229 lb 9.6 oz (104.1 kg)  03/04/22 226 lb (102.5 kg)  02/28/22 228 lb (103.4 kg)  02/11/22 226 lb 6.4 oz (102.7 kg)  01/28/22 230 lb (104.3 kg)  01/02/22 226 lb 12.8 oz (102.9 kg)  12/25/21 228 lb (103.4 kg)  12/13/21 229 lb (103.9 kg)  11/09/21 214 lb 12.8 oz (97.4 kg)  10/05/21 231 lb 3.2 oz (104.9 kg)  09/28/21 230 lb 6.4 oz (104.5 kg)  07/30/21 221 lb 4.8 oz (100.4 kg)  07/25/21 220 lb 4.8 oz (99.9 kg)   BMI Readings from Last 5 Encounters:  04/04/22 41.17 kg/m  03/21/22 40.03 kg/m  03/12/22 40.67 kg/m  03/04/22 40.03 kg/m  02/28/22 40.39 kg/m        Current Outpatient Medications:    amitriptyline (ELAVIL) 75 MG tablet, Take 1 tablet (75 mg total) by mouth at bedtime., Disp: 90 tablet, Rfl: 3   atorvastatin (LIPITOR) 40 MG tablet, TAKE 1 TABLET (40 MG TOTAL) BY MOUTH AT BEDTIME., Disp: 90 tablet, Rfl: 3   BAC 50-325-40 MG tablet, TAKE 1 TABLET BY MOUTH EVERY 8 HOURS AS NEEDED FOR HEADACHE., Disp: 40 tablet, Rfl: 3   baclofen (LIORESAL) 10 MG tablet, Take 1 tablet  (10 mg total) by mouth 2 (two) times daily. Each refill must last 30 days., Disp: 60 tablet, Rfl: 2   carbamazepine (TEGRETOL-XR) 100 MG 12 hr tablet, Take 1 tablet (100 mg total) by mouth once daily at bedtime., Disp: 30 tablet, Rfl: 3   cyanocobalamin (,VITAMIN B-12,) 1000 MCG/ML injection, INJECT 1 ML (1,000 MCG) INTO THE MUSCLE ONCE FOR 1 DOSE, Disp: 1 mL, Rfl: 3   Eptinezumab-jjmr (VYEPTI) 100 MG/ML injection, Inject 1 mL (100 mg total) into the vein every 3 (three) months., Disp: 1 mL, Rfl: 3   HYDROcodone-acetaminophen (NORCO) 7.5-325 MG tablet, Take 1 tablet by mouth 2 (two) times daily as needed for severe pain. Must last 30 days, Disp: 45 tablet, Rfl: 0   [START ON 05/03/2022] HYDROcodone-acetaminophen (NORCO) 7.5-325 MG tablet, Take 1 tablet by mouth 2 (two) times daily as needed for severe pain. Must last 30 days, Disp: 45 tablet, Rfl: 0   lipase/protease/amylase (CREON) 12000-38000 units CPEP capsule, Take 2 capsules by mouth with the first bite of each meal and take 1 capsule before snacks. (Max of 8 capsules per day)., Disp: 270 capsule, Rfl: 1   LORazepam (ATIVAN) 1 MG tablet, Take 0.5 tablets (0.5 mg total) by  mouth 2 (two) times daily as needed for anxiety., Disp: 30 tablet, Rfl: 0   meclizine (ANTIVERT) 12.5 MG tablet, Take 1 tablet (12.5 mg total) by mouth 3 (three) times daily as needed for dizziness., Disp: 30 tablet, Rfl: 0   medroxyPROGESTERone (PROVERA) 5 MG tablet, Take 1 tablet (5 mg total) by mouth daily., Disp: 90 tablet, Rfl: 1   metFORMIN (GLUCOPHAGE) 500 MG tablet, Take 1 tablet (500 mg total) by mouth 2 (two) times daily with a meal., Disp: 180 tablet, Rfl: 3   metoprolol tartrate (LOPRESSOR) 50 MG tablet, Take 1 and (1/2) tablets (75 mg total) by mouth 2 (two) times daily., Disp: 135 tablet, Rfl: 3   Needles & Syringes MISC, For administration of B12 injections, Disp: 30 each, Rfl: 0   omeprazole (PRILOSEC) 40 MG capsule, Take 1 capsule (40 mg total) by mouth daily.,  Disp: 90 capsule, Rfl: 3   ondansetron (ZOFRAN) 4 MG tablet, Take 1 tablet (4 mg total) by mouth every 8 (eight) hours as needed., Disp: 20 tablet, Rfl: 2   Rimegepant Sulfate (NURTEC) 75 MG TBDP, Take 1 tablet by mouth once every other day., Disp: 30 tablet, Rfl: 3   rizatriptan (MAXALT) 10 MG tablet, TAKE 1 TABLET BY MOUTH AS NEEDED FOR MIGRAINE. MAY REPEAT IN 2 HOURS IF NEEDED, Disp: 10 tablet, Rfl: 0   scopolamine (TRANSDERM-SCOP) 1 MG/3DAYS, Place 1 patch (1.5 mg total) onto the skin every 3 (three) days., Disp: 10 patch, Rfl: 12   VALERIAN PO, Take by mouth as needed. Makes Valerian tea about 3-4 times per week., Disp: , Rfl:    diclofenac Sodium (VOLTAREN) 1 % GEL, Apply 2 g topically 4 (four) times daily as needed., Disp: 350 g, Rfl: PRN   dicyclomine (BENTYL) 10 MG capsule, Take 1 capsule (10 mg total) by mouth 4 (four) times daily as needed (before meals and at bedtime), Disp: 360 capsule, Rfl: 1   HYDROcodone-acetaminophen (NORCO) 7.5-325 MG tablet, Take 1 tablet by mouth 2 (two) times daily as needed for severe pain. Must last 30 days, Disp: 45 tablet, Rfl: 0   pregabalin (LYRICA) 150 MG capsule, Take 1 capsule (150 mg total) by mouth every 8 (eight) hours., Disp: 270 capsule, Rfl: 3  Patient Active Problem List   Diagnosis Date Noted   Neurogenic bladder 01/28/2022   Urinary and fecal incontinence 01/28/2022   New onset type 2 diabetes mellitus ([place]) 01/02/2022   OSA (obstructive sleep apnea) 12/14/2021   Excessive daytime sleepiness 12/14/2021   Lateral epicondylitis, left elbow 07/19/2021   Abnormal MRI, cervical spine (02/14/2021) 02/28/2021   Cervicalgia 02/13/2021   Abnormal MRI, lumbar spine (12/27/2021) 11/09/2020   Chronic midline thoracic back pain 11/09/2020   Abnormal MRI, thoracic spine (08/02/2019) 11/09/2020   Thoracic spinal stenosis (T7-8) 11/09/2020   Prolapse of thoracic disc with radiculopathy (T7-8) 11/09/2020   Spasm of muscle of lower back 11/09/2020    Vertigo, benign paroxysmal, unspecified laterality 12/75/1700   Uncomplicated opioid dependence ([place]) 11/08/2020   Hyperalgesia 03/07/2020   Neurogenic urinary incontinence 03/01/2020   Other intervertebral disc degeneration, lumbar region 03/01/2020   Spinal stenosis of lumbosacral region 03/01/2020   Pharmacologic therapy 02/06/2020   Lumbar radiculitis (Right) 09/21/2019   Chronic lower extremity pain (Bilateral) 09/06/2019   Intractable migraine with aura without status migrainosus 08/10/2019   Other specified dorsopathies, sacral and sacrococcygeal region 08/03/2019   Latex precautions, history of latex allergy 08/03/2019   History of allergy to radiographic contrast media 08/03/2019  DDD (degenerative disc disease), cervical 07/21/2019   Cervical facet syndrome (Bilateral) (L>R) 07/21/2019   DDD (degenerative disc disease), thoracic 07/21/2019   Osteoarthritis of hip (Left) 07/21/2019   Chronic groin pain (Bilateral) (L>R) 07/21/2019   Chronic hip pain (Bilateral) (L>R) 07/21/2019   Somatic dysfunction of sacroiliac joint (Bilateral) 07/21/2019   Migraine with aura and with status migrainosus, not intractable 04/06/2019   Cervico-occipital neuralgia (Left) 04/06/2019   Weakness of leg (Left) 04/05/2019   Difficulty walking 04/05/2019   Chronic migraine without aura, with intractable migraine, so stated, with status migrainosus 12/27/2018   Malar rash 09/04/2018   Trigger point of neck (Left) 03/19/2018   Occipital headache 12/25/2017   Chronic fatigue syndrome with fibromyalgia 12/12/2017   Trigger point of shoulder region (Left) 11/17/2017   Myofascial pain syndrome (Left) (trapezius muscle) 07/22/2017   Lumbar L1-2 disc protrusion (Right) 04/07/2017   Muscle spasticity 04/01/2017   Osteoarthritis of shoulder (Bilateral) 04/01/2017   Lumbar spondylosis 01/06/2017   Chronic hip pain (Left) 12/24/2016   Chronic sacroiliac joint pain (Left) 12/24/2016   Lumbar facet syndrome  (Bilateral) (L>R) 12/24/2016   Lumbar radiculitis (Left) 12/24/2016   Hypertriglyceridemia 11/27/2016   History of vasovagal episode 10/30/2016   Cervicogenic headache 09/09/2016   Medication monitoring encounter 08/29/2016   Controlled substance agreement signed 08/28/2016   Plantar fasciitis of left foot 08/28/2016   Vitamin B12 deficiency 08/28/2016   Hyperlipidemia 08/28/2016   Nephrolithiasis 08/12/2016   Chronic pain syndrome 08/07/2016   Long term prescription opiate use 08/07/2016   Opiate use 08/07/2016   Long term prescription benzodiazepine use 08/07/2016   Neurogenic pain 08/07/2016   Chronic low back pain (1ry area of Pain) (Bilateral) (R>L) (midline) 08/07/2016   Chronic upper back pain (2ry area of Pain) (Bilateral) (L>R) 08/07/2016   Chronic abdominal pain (Right lower quadrant) 08/07/2016   Thoracic radiculitis (Bilateral: T10, T11) 08/07/2016   Chronic occipital neuralgia (3ry area of Pain) (Bilateral) (L>R) 08/07/2016   Chronic neck pain 08/07/2016   Chronic cervical radicular pain (Bilateral) (L>R) 08/07/2016   Chronic shoulder blade pain (Bilateral) (L>R) 08/07/2016   Chronic upper extremity pain (Bilateral) (R>L) 08/07/2016   Chronic knee pain (Bilateral) (R>L) 08/07/2016   Chronic ankle pain (Bilateral) 08/07/2016   Cervical spondylosis with myelopathy and radiculopathy 08/07/2016   Panic disorder with agoraphobia 05/29/2016   Depression, unspecified depression type 05/29/2016   Atypical lymphocytosis 05/01/2016   Vitamin D insufficiency 05/01/2016   Chronic lower extremity cramps (Bilateral) (R>L) 04/29/2016   Obesity 04/29/2016   GAD (generalized anxiety disorder) 04/29/2016   Fatigue 04/29/2016   Insomnia 07/12/2015   Migraine without aura and with status migrainosus, not intractable 07/12/2015   Chronic superficial gastritis 06/02/2015   Chronic pain of multiple joints 05/15/2015   Bilateral leg edema 05/02/2015   Paroxysmal supraventricular  tachycardia (HCC) 04/17/2015   Exertional shortness of breath 04/17/2015   Bright red rectal bleeding 04/06/2015   DDD (degenerative disc disease), lumbosacral 01/24/2014   DDD (degenerative disc disease), lumbar 01/24/2014   Cervico-occipital neuralgia 12/29/2013   Fibromyalgia 12/29/2013   Migraine headache 12/29/2013   Menorrhagia 12/10/2012   Depression, major, recurrent, in remission ([place]) 01/12/2009   Chest pain 01/12/2009   Hypertension, benign essential, goal below 140/90 06/23/2008   History of PSVT (paroxysmal supraventricular tachycardia) 06/17/2008   Obstructive sleep apnea 06/17/2008   GERD 06/13/2008    Past Surgical History:  Procedure Laterality Date   ABLATION     Uterine   BREAST BIOPSY Left  2014 U/S bx fibroadenoma   CARDIAC CATHETERIZATION     with ablation   COLONOSCOPY WITH PROPOFOL N/A 05/17/2015   Procedure: COLONOSCOPY WITH PROPOFOL;  Surgeon: Manya Silvas, MD;  [facility]COPY;  Service: Endoscopy;  Laterality: N/A;   COLONOSCOPY WITH PROPOFOL N/A 03/20/2020   Procedure: COLONOSCOPY WITH PROPOFOL;  Surgeon: Jonathon Bellows, MD;  Location[facility] ENDOSCOPY;  Service: Gastroenterology;  Laterality: N/A;   ESOPHAGOGASTRODUODENOSCOPY N/A 05/17/2015   Procedure: ESOPHAGOGASTRODUODENOSCOPY (EGD);  Surgeon: Manya Silvas, MD;  Location: [facility] ENDOSCOPY;  Service: Endoscopy;  Laterality: N/A;   KNEE ARTHROSCOPY     spg     6/18   Tibial Tubercle Bypass Right 1998   TUBAL LIGATION  10/01/1999    Family History  Problem Relation Age of Onset   Depression Mother    Hypertension Mother    Cancer Mother        Skin   Hyperlipidemia Mother    Anxiety disorder Mother    Migraines Mother    Alcohol abuse Father    Depression Father    Stroke Father    Heart disease Father    Hypertension Father    Anxiety disorder Father    Depression Sister    Hyperlipidemia Sister    Diabetes Sister    Hypertension Sister    Polycystic ovary syndrome  Sister    Bipolar disorder Sister    Anxiety disorder Sister    Migraines Sister    Depression Sister    Hypertension Sister    Anxiety disorder Sister    Migraines Sister    Breast cancer Maternal Grandmother 93   Cancer Maternal Grandmother 63       Breast   Thyroid disease Maternal Grandmother    Arthritis Maternal Grandmother    Hyperlipidemia Maternal Grandmother    Aneurysm Maternal Grandfather    Hypertension Maternal Grandfather    Heart disease Maternal Grandfather    Alzheimer's disease Paternal Grandmother    Heart attack Paternal Grandfather    Hypertension Paternal Grandfather    COPD Paternal Grandfather    Heart disease Paternal Grandfather    Migraines Daughter    Other Daughter        leomyoa scaroma   Migraines Daughter    Migraines Son    Alzheimer's disease Other    Bladder Cancer Neg Hx    Kidney cancer Neg Hx     Social History   Tobacco Use   Smoking status: Former    Packs/day: 2.00    Years: 3.00    Total pack years: 6.00    Types: Cigarettes    Quit date: 12/10/1992    Years since quitting: 29.3   Smokeless tobacco: Never   Tobacco comments:    quit 25 years ago  Vaping Use   Vaping Use: Never used  Substance Use Topics   Alcohol use: No    Comment: socially   Drug use: No     Allergies  Allergen Reactions   Aspirin Swelling   Cephalexin Rash   Cymbalta [Duloxetine Hcl] Other (See Comments)    Suicidal ideations and has homicidal thoughts per patient   Depakote [Divalproex Sodium] Shortness Of Breath    W/ n/v   Gadolinium Derivatives     Pt was unable to breath Other reaction(s): Other (See Comments) Pt was unable to breath   Haloperidol Shortness Of Breath    W/ n/v   Iodinated Contrast Media Other (See Comments) and Shortness Of Breath  Other reaction(s): Other (See Comments)   Meperidine Nausea And Vomiting    Patient projectile vomits and usually result in ER Other reaction(s): Vomiting   Metoclopramide Shortness  Of Breath    Can't breath, wheezes Other reaction(s): Other (See Comments)   Morphine     Other reaction(s): Vomiting   Penicillins Rash    Other reaction(s): Unknown   Prochlorperazine Other (See Comments)    Panic attack Other reaction(s): Other (See Comments)   Tramadol Hcl Palpitations    Severely and adversely affects her SVT giving her tachycardias of 150-160 bpm.   Trazodone Shortness Of Breath    Other reaction(s): Other (See Comments)   Meloxicam Other (See Comments)    mouth sores, tingling, blisters in mouth Other reaction(s): Other (See Comments) mout sores, tingling mouth sores, tingling, blisters in mouth   Neomycin-Bacitracin Zn-Polymyx Rash   Tomato Hives    Tongue will blister   Other     Other reaction(s): Other (See Comments)   Shellfish Allergy     Other reaction(s): Unknown   Shellfish-Derived Products Other (See Comments)    Other reaction(s): Unknown Other reaction(s): Unknown   Bacitracin-Neomycin-Polymyxin Rash   Cephalosporins Rash    rash   Ibuprofen Other (See Comments) and Rash    Blisters in mouth. Blisters in mouth.   Latex Itching    Other reaction(s): Unknown   Nsaids Other (See Comments)    Blisters in mouth; can take 1 ibuprofen 2x a month Other reaction(s): Other (See Comments), Unknown Blisters in mouth; can take 1 ibuprofen 2x a month   Sulfa Antibiotics Rash    rash Other reaction(s): Unknown rash  Other reaction(s): Unknown rash rash Other reaction(s): Unknown rash   Sulfonamide Derivatives Rash    Health Maintenance  Topic Date Due   FOOT EXAM  Never done   OPHTHALMOLOGY EXAM  Never done   URINE MICROALBUMIN  Never done   Zoster Vaccines- Shingrix (1 of 2) Never done   COVID-19 Vaccine (4 - Booster) 04/06/2022 (Originally 10/02/2021)   TETANUS/TDAP  12/27/2022 (Originally 09/30/2021)   INFLUENZA VACCINE  04/30/2022   HEMOGLOBIN A1C  06/29/2022   MAMMOGRAM  09/14/2023   PAP SMEAR-Modifier  11/14/2023   COLONOSCOPY  (Pts 45-21yr Insurance coverage will need to be confirmed)  03/20/2030   Hepatitis C Screening  Completed   HIV Screening  Completed   HPV VACCINES  Aged Out    Chart Review Today: I personally reviewed active problem list, medication list, allergies, family history, social history, health maintenance, notes from last encounter, lab results, imaging with the patient/caregiver today.   Review of Systems  Constitutional: Negative.   HENT: Negative.    Eyes: Negative.   Respiratory: Negative.    Cardiovascular: Negative.   Gastrointestinal: Negative.   Endocrine: Negative.   Genitourinary: Negative.   Musculoskeletal: Negative.   Skin: Negative.   Allergic/Immunologic: Negative.   Neurological: Negative.   Hematological: Negative.   Psychiatric/Behavioral: Negative.    All other systems reviewed and are negative.    Objective:   Vitals:   04/04/22 1339  BP: 130/80  Pulse: 100  Resp: 16  Temp: 98.4 F (36.9 C)  TempSrc: Oral  SpO2: 96%  Weight: 232 lb 6.4 oz (105.4 kg)  Height: '5\' 3"'$  (1.6 m)    Body mass index is 41.17 kg/m.  Physical Exam Vitals and nursing note reviewed.  Constitutional:      General: She is not in acute distress.    Appearance: Normal appearance.  She is obese. She is not ill-appearing, toxic-appearing or diaphoretic.  HENT:     Head: Normocephalic and atraumatic.     Right Ear: External ear normal.     Left Ear: External ear normal.  Eyes:     Comments: Wears sun glasses throughout OV  Cardiovascular:     Rate and Rhythm: Normal rate and regular rhythm.     Pulses: Normal pulses.     Heart sounds: Normal heart sounds.  Pulmonary:     Effort: Pulmonary effort is normal.     Breath sounds: Normal breath sounds.  Abdominal:     General: Bowel sounds are normal.     Palpations: Abdomen is soft.  Musculoskeletal:     Right lower leg: No edema.     Left lower leg: No edema.  Skin:    General: Skin is warm and dry.     Capillary  Refill: Capillary refill takes less than 2 seconds.     Coloration: Skin is not jaundiced or pale.  Neurological:     Mental Status: She is alert. Mental status is at baseline.     Gait: Gait abnormal (walks with cane, slow antalgic gait).  Psychiatric:        Mood and Affect: Mood normal.         Assessment & Plan:   Problem List Items Addressed This Visit       Cardiovascular and Mediastinum   Paroxysmal supraventricular tachycardia ([place])    Managed by cardiology, currently well controlled with metoprolol        Endocrine   New onset type 2 diabetes mellitus ([place]) - Primary    She is working on [employer], monitoring her blood sugars which mostly are in normal range, a few high readings only once-twice  150 to 157 She started metformin and is tolerating Advised her to gradually increase metformin dose as tolerated up to 1000 mg twice daily  Foot exam done today Due for diabetic eye exam We will screen for urine microalbumin She is not on a ACE or ARB but she is on a statin      Relevant Orders   Hemoglobin A1c   Microalbumin, urine   Ambulatory referral to Ophthalmology     Nervous and Auditory   Disease of spinal cord [facility])    She is seeing neurology and pain management specialist/physical medicine and rehab        Other   Uncomplicated opioid dependence ([place]) (Chronic)    Per pain management      Class 3 severe obesity with serious comorbidity and body mass index (BMI) of 45.0 to 49.9 in adult [facility])    With multiple comorbidities including new onset diabetes, hyperlipidemia, chronic pain, anxiety and depression, currently being evaluated for obstructive sleep apnea      Other Visit Diagnoses     MDD (major depressive disorder), recurrent episode, moderate ([place])   (Chronic)     mood worse over the past year - TSH was normal, she is on multiple meds for pain may need psychiatry for mood evaluation and medical management   Adjustment disorder,  unspecified type       increased anxiety and stress, gaining weight, feels worried all the time   Constipation, unspecified constipation type       doing miralax - she needs to f/up with GI she says she will make an appt         Delsa Grana, PA-C 04/04/22 2:12 PM

## 2022-04-04 NOTE — Assessment & Plan Note (Signed)
Per pain management.  

## 2022-04-04 NOTE — Assessment & Plan Note (Addendum)
She is working on [employer], monitoring her blood sugars which mostly are in normal range, a few high readings only once-twice  150 to 157 She started metformin and is tolerating Advised her to gradually increase metformin dose as tolerated up to 1000 mg twice daily  Foot exam done today Due for diabetic eye exam We will screen for urine microalbumin She is not on a ACE or ARB but she is on a statin

## 2022-04-04 NOTE — Assessment & Plan Note (Signed)
She is seeing neurology and pain management specialist/physical medicine and rehab

## 2022-04-11 ENCOUNTER — Other Ambulatory Visit: Payer: Self-pay | Admitting: Family Medicine

## 2022-04-11 DIAGNOSIS — H5213 Myopia, bilateral: Secondary | ICD-10-CM | POA: Diagnosis not present

## 2022-04-11 DIAGNOSIS — I10 Essential (primary) hypertension: Secondary | ICD-10-CM

## 2022-04-11 LAB — HM DIABETES EYE EXAM

## 2022-04-12 DIAGNOSIS — G4733 Obstructive sleep apnea (adult) (pediatric): Secondary | ICD-10-CM | POA: Diagnosis not present

## 2022-04-17 DIAGNOSIS — E119 Type 2 diabetes mellitus without complications: Secondary | ICD-10-CM | POA: Diagnosis not present

## 2022-04-18 ENCOUNTER — Encounter: Payer: Self-pay | Admitting: Physical Medicine and Rehabilitation

## 2022-04-18 LAB — HEMOGLOBIN A1C
Hgb A1c MFr Bld: 6.6 % of total Hgb — ABNORMAL HIGH (ref <5.7)
Mean Plasma Glucose: 143 mg/dL
eAG (mmol/L): 7.9 mmol/L

## 2022-04-18 LAB — MICROALBUMIN, URINE: Microalb, Ur: 0.3 mg/dL

## 2022-04-19 ENCOUNTER — Ambulatory Visit (INDEPENDENT_AMBULATORY_CARE_PROVIDER_SITE_OTHER): Payer: Medicaid Other | Admitting: Neurosurgery

## 2022-04-19 ENCOUNTER — Other Ambulatory Visit: Payer: Self-pay | Admitting: Physical Medicine and Rehabilitation

## 2022-04-19 DIAGNOSIS — G894 Chronic pain syndrome: Secondary | ICD-10-CM

## 2022-04-19 NOTE — Progress Notes (Signed)
Attempted to call the patient to review her [place] message (e-mailed to me yesterday). I sent her a MyChart message in [facility] explaining her thoracic MRI results which were largely negative and letting her know she could proceed with evaluation of SCS trial.  Got VM without [place] information. Please relay this information to her should she call back. This is also in her [facility] MyChart

## 2022-05-09 ENCOUNTER — Encounter: Payer: Self-pay | Admitting: Physical Medicine and Rehabilitation

## 2022-05-09 ENCOUNTER — Other Ambulatory Visit: Payer: Self-pay | Admitting: Family Medicine

## 2022-05-09 ENCOUNTER — Encounter: Payer: Self-pay | Admitting: Family Medicine

## 2022-05-09 DIAGNOSIS — F430 Acute stress reaction: Secondary | ICD-10-CM

## 2022-05-09 DIAGNOSIS — R000 Tachycardia, unspecified: Secondary | ICD-10-CM

## 2022-05-10 ENCOUNTER — Other Ambulatory Visit: Payer: Self-pay | Admitting: Family Medicine

## 2022-05-10 DIAGNOSIS — F430 Acute stress reaction: Secondary | ICD-10-CM

## 2022-05-10 DIAGNOSIS — R000 Tachycardia, unspecified: Secondary | ICD-10-CM

## 2022-05-10 NOTE — Telephone Encounter (Signed)
Requested medication (s) are due for refill today: duplicate request  Requested medication (s) are on the active medication list: yes  Last refill:  08/02/21 #30 0 refills  Future visit scheduled: no  Notes to clinic:  not delegated per protocol. Do you want to refill Rx?     Requested Prescriptions  Pending Prescriptions Disp Refills   LORazepam (ATIVAN) 1 MG tablet [Pharmacy Med Name: LORAZEPAM 1 MG TAB] 30 tablet     Sig: TAKE 1/2 TABLET BY MOUTH 2 TIMES DAILY AS NEEDED FOR ANXIETY     Not Delegated - Psychiatry: Anxiolytics/Hypnotics 2 Failed - 05/10/2022 10:29 AM      Failed - This refill cannot be delegated      Failed - Urine Drug Screen completed in last 360 days      Passed - Patient is not pregnant      Passed - Valid encounter within last 6 months    Recent Outpatient Visits           1 month ago New onset type 2 diabetes mellitus [facility])   [facility] Delsa Grana, PA-C   4 months ago [facility] [place], Kristeen Miss, PA-C   4 months ago Chronic bilateral low back pain with bilateral sciatica   [facility] Delsa Grana, PA-C   8 months ago Depression, major, recurrent, in remission [facility])   [facility] Serafina Royals F, FNP   9 months ago Hypertension, benign essential, goal below 140/90   [facility] Delsa Grana, [place]

## 2022-05-13 ENCOUNTER — Encounter
Payer: Medicaid Other | Attending: Physical Medicine and Rehabilitation | Admitting: Physical Medicine and Rehabilitation

## 2022-05-13 DIAGNOSIS — G43001 Migraine without aura, not intractable, with status migrainosus: Secondary | ICD-10-CM | POA: Diagnosis not present

## 2022-05-13 DIAGNOSIS — G4733 Obstructive sleep apnea (adult) (pediatric): Secondary | ICD-10-CM | POA: Diagnosis not present

## 2022-05-13 NOTE — Progress Notes (Signed)
Subjective:    Patient ID: Dana Bradley, female    DOB: [birth year], 51 y.o.   MRN: [id number]  HPI   An audio/video tele-health visit is felt to be the most appropriate encounter for this patient at this time. This is a follow up tele-visit via phone. The patient is at home. MD is at office. Prior to scheduling this appointment, our staff discussed the limitations of evaluation and management by telemedicine and the availability of in-person appointments. The patient expressed understanding and agreed to proceed.   1) Chronic Migraine:  -Mrs. Dana Bradley a 51 year old woman who presents for f/u of her chronic migraine  -she has noted recently that when she lays flat she feels fine but as soon as she sits up she feels an excruciating migraine -migraine continues to worsen from sitting to standing position -her daughter has similar symptoms but felt better after starting acetazolamide for presumed diagnosis of intracranial hypertension. She asks whether she may benefit from this medication as well.  Terrible stomach spasms and diarrhea. 12 hours later debilitating migraine. It usually starts in the afternoon. She feels that this may be part of her prodrome. She also has terrible vertigo that is getting worse. The Fiorcet helps a lot, the Migronal helps with the frontal headache. These make the pain bearable.  -having migraines close to every 24H -she is not functional -she is not sure if Maxaalt is helping. She is not taking with the Nurtec. It is helping some but she is not sure how much. The fioricet alone helps by putting her to sleep.  -she has not heart any news about the Vypeti infusion. The medication was approved  -has been having more dizziness.  -the pain has been worst since she had the occipital nerve blocks.  -she knows it is stress related -she feels a pain radiating down her arm as well. It also runs into her head and into her face.  -Sometimes her whole left side is fizzy.  -she has  tried Amitriptyline.  -have been awful since December. Had a migraine from Dec 1st throughout 9th.  -the day before she was incapacitated.  -Vyepti has been approved -The nurtec and the Fiorocet help together to break the migraine.  -she needs a refill of her Fioricet and nausea, she finds this helpful.  -Nurtec- she is still working with the company to get this improved -she asks about prodrome and postdrome (she experiences cramping and diarrhea).  -on Sunday she closed out a 56 hour migraines. -she has been trying to follow ketogenic diet.  -Nurtec helping- 5 days in a row without a migraines.  -she feels if she could take it as a rescue medication in addition to every other day this may help.   2) Vertigo -Symptoms are worsening.  -She feels that this may be leading up to the vertigo.  -She is interested in vestibular therapy.  -she had a heating pad on the shoulder and arm.   3) Cervical myofasical pain syndrome: -Muscles of neck are very tight, pain is worse on the left side.  -She did not find spinal injections helpful, they made the pain worse.   4) Insomnia: she sleeps in chunks -she is willing to increase amitriptyline to '50mg'$ .   5) Cognitive impairments: -she loves to cook and noticed she has been making more mistakes -she thinks the headaches are a problem.  6) Pain radiating from back into legs -feels twitches -worse at night -legs kick out at  night.   7) IBS -terrible in the night -2am on Sunday. -horrible pain -often with diarrhea and improves symptoms after she has diarrhea.  -cramping.   Mrs. Dana Bradley is a 51 year old woman who presents for follow-up of her chronic migraine. Her average pain is 6/10 and pain right now is 6/10. Her pain is intermittent, sharp, burning, stabbing, tingling, and aching.   She recently had 7 days without migraine. She is not sure what was associated with this. She had a pretty terrible bounceback.  She picked up the Migranal  nasal spray. Our CMA explained how it works.   Her pain doctor is going to stop prescribing the Lyrica and she needs to find a new doctor to prescribe this.    Pain Inventory Average Pain 5 Pain Right Now 7 My pain is intermittent, sharp, burning, stabbing, and tingling  In the last 24 hours, has pain interfered with the following? General activity 8 Relation with others 8 Enjoyment of life 8 What TIME of day is your pain at its worst? morning , evening, and night Sleep (in general) Poor  Pain is worse with: bending and some activites Pain improves with: medication and TENS Relief from Meds: 5     Family History  Problem Relation Age of Onset   Depression Mother    Hypertension Mother    Cancer Mother        Skin   Hyperlipidemia Mother    Anxiety disorder Mother    Migraines Mother    Alcohol abuse Father    Depression Father    Stroke Father    Heart disease Father    Hypertension Father    Anxiety disorder Father    Depression Sister    Hyperlipidemia Sister    Diabetes Sister    Hypertension Sister    Polycystic ovary syndrome Sister    Bipolar disorder Sister    Anxiety disorder Sister    Migraines Sister    Depression Sister    Hypertension Sister    Anxiety disorder Sister    Migraines Sister    Breast cancer Maternal Grandmother 60   Cancer Maternal Grandmother 63       Breast   Thyroid disease Maternal Grandmother    Arthritis Maternal Grandmother    Hyperlipidemia Maternal Grandmother    Aneurysm Maternal Grandfather    Hypertension Maternal Grandfather    Heart disease Maternal Grandfather    Alzheimer's disease Paternal Grandmother    Heart attack Paternal Grandfather    Hypertension Paternal Grandfather    COPD Paternal Grandfather    Heart disease Paternal Grandfather    Migraines Daughter    Other Daughter        leomyoa scaroma   Migraines Daughter    Migraines Son    Alzheimer's disease Other    Bladder Cancer Neg Hx    Kidney  cancer Neg Hx    Social History   Socioeconomic History   Marital status: Married    Spouse name: brian   Number of children: 3   Years of education: Not on file   Highest education level: Associate degree: academic program  Occupational History   Occupation: disbled    Comment: not able  Tobacco Use   Smoking status: Former    Packs/day: 2.00    Years: 3.00    Total pack years: 6.00    Types: Cigarettes    Quit date: 12/10/1992    Years since quitting: 29.4   Smokeless tobacco: Never  Tobacco comments:    quit 25 years ago  Vaping Use   Vaping Use: Never used  Substance and Sexual Activity   Alcohol use: No    Comment: socially   Drug use: No   Sexual activity: Not Currently    Partners: Male    Birth control/protection: Surgical  Other Topics Concern   Not on file  Social History Narrative   Lives at home with her husband and 2 of her children   Right handed   Caffeine: 0-2 cups daily   Social Determinants of Health   Financial Resource Strain: High Risk (08/29/2017)   Overall Financial Resource Strain (CARDIA)    Difficulty of Paying Living Expenses: Very hard  Food Insecurity: Food Insecurity Present (08/29/2017)   Hunger Vital Sign    Worried About Running Out of Food in the Last Year: Often true    Ran Out of Food in the Last Year: Often true  Transportation Needs: Unmet Transportation Needs (08/29/2017)   PRAPARE - Hydrologist (Medical): Yes    Lack of Transportation (Non-Medical): Yes  Physical Activity: Inactive (08/29/2017)   Exercise Vital Sign    Days of Exercise per Week: 0 days    Minutes of Exercise per Session: 0 min  Stress: Stress Concern Present (08/29/2017)   [employer] of Stress : Rather much  Social Connections: Somewhat Isolated (08/29/2017)   Social Connection and Isolation Panel [NHANES]    Frequency of Communication with  Friends and Family: Never    Frequency of Social Gatherings with Friends and Family: Never    Attends Religious Services: More than 4 times per year    Active Member of Clubs or Organizations: No    Attends Archivist Meetings: Never    Marital Status: Married   Past Surgical History:  Procedure Laterality Date   ABLATION     Uterine   BREAST BIOPSY Left    2014 U/S bx fibroadenoma   CARDIAC CATHETERIZATION     with ablation   COLONOSCOPY WITH PROPOFOL N/A 05/17/2015   Procedure: COLONOSCOPY WITH PROPOFOL;  Surgeon: Manya Silvas, MD;  Location: [facility];  Service: Endoscopy;  Laterality: N/A;   COLONOSCOPY WITH PROPOFOL N/A 03/20/2020   Procedure: COLONOSCOPY WITH PROPOFOL;  Surgeon: Jonathon Bellows, MD;  Location[facility] ENDOSCOPY;  Service: Gastroenterology;  Laterality: N/A;   ESOPHAGOGASTRODUODENOSCOPY N/A 05/17/2015   Procedure: ESOPHAGOGASTRODUODENOSCOPY (EGD);  Surgeon: Manya Silvas, MD;  Location[facility] ENDOSCOPY;  Service: Endoscopy;  Laterality: N/A;   KNEE ARTHROSCOPY     spg     6/18   Tibial Tubercle Bypass Right 1998   TUBAL LIGATION  10/01/1999   Past Surgical History:  Procedure Laterality Date   ABLATION     Uterine   BREAST BIOPSY Left    2014 U/S bx fibroadenoma   CARDIAC CATHETERIZATION     with ablation   COLONOSCOPY WITH PROPOFOL N/A 05/17/2015   Procedure: COLONOSCOPY WITH PROPOFOL;  Surgeon: Manya Silvas, MD;  Location: [facility] ENDOSCOPY;  Service: Endoscopy;  Laterality: N/A;   COLONOSCOPY WITH PROPOFOL N/A 03/20/2020   Procedure: COLONOSCOPY WITH PROPOFOL;  Surgeon: Jonathon Bellows, MD;  Location: [facility] ENDOSCOPY;  Service: Gastroenterology;  Laterality: N/A;   ESOPHAGOGASTRODUODENOSCOPY N/A 05/17/2015   Procedure: ESOPHAGOGASTRODUODENOSCOPY (EGD);  Surgeon: Manya Silvas, MD;  Location: [facility] ENDOSCOPY;  Service: Endoscopy;  Laterality: N/A;   KNEE ARTHROSCOPY  spg     6/18   Tibial Tubercle Bypass Right 1998   TUBAL LIGATION   10/01/1999   Past Medical History:  Diagnosis Date   Acute postoperative pain 04/07/2017   Anxiety    Bursitis    Chronic fatigue 12/12/2017   Chronic fatigue syndrome    Colitis 2021   Edema leg 05/02/2015   Fibromyalgia    GERD (gastroesophageal reflux disease)    IBS (irritable bowel syndrome)    Knee pain, bilateral 12/21/2008   Qualifier: Diagnosis of  By: Hassell Done FNP, [place]     Lumbar discitis    Migraines    Osteoarthritis    Right hand pain 04/10/2015   [facility] has done nerve conduction studies and ruled out carpal tunnel.    Sleep apnea    Spinal stenosis    SVT (supraventricular tachycardia) (HCC)    Vertigo    Vitamin D deficiency 05/01/2016   There were no vitals taken for this visit.  Opioid Risk Score:   Fall Risk Score:  `1  Depression screen PHQ 2/9     04/04/2022    1:35 PM 03/04/2022    2:44 PM 02/28/2022    9:33 AM 02/11/2022   10:16 AM 01/28/2022    8:17 AM 01/02/2022    1:22 PM 12/26/2021    1:07 PM  Depression screen PHQ 2/9  Decreased Interest 2 0 0 3 0 0 0  Down, Depressed, Hopeless 2 0 0 3 0 0 0  PHQ - 2 Score 4 0 0 6 0 0 0  Altered sleeping 2     0 0  Tired, decreased energy 3     0 0  Change in appetite 0     0 0  Feeling bad or failure about yourself  2     0 0  Trouble concentrating 2     0 0  Moving slowly or fidgety/restless 0     0 0  Suicidal thoughts 0     0 0  PHQ-9 Score 13     0 0  Difficult doing work/chores Somewhat difficult     Not difficult at all Not difficult at all   Review of Systems  Constitutional: Negative.   HENT: Negative.    Eyes: Negative.   Respiratory: Negative.    Cardiovascular: Negative.   Gastrointestinal: Negative.   Endocrine: Negative.   Genitourinary: Negative.   Musculoskeletal:  Positive for back pain.       Left side of body from head to toes  Skin: Negative.   Allergic/Immunologic: Negative.   Neurological:  Positive for dizziness and headaches.  Hematological: Negative.    Psychiatric/Behavioral: Negative.    All other systems reviewed and are negative.      Objective:   Physical Exam Not performed     Assessment & Plan:  Mrs. Vigilante is a 51 year old woman who presents for f/u with chronic intractable migraines s/p numerous treatments, severe fibromyalgia, and IBS, and nausea, and vertigo.     Vertigo: In the future, restart for cervical myofascial pain syndrome: myofascial release, postural correction, stretching and strengthening of the muscles of the neck and upper back, development of HEP. Conitnue heating pads to muscles of upper back and neck. She is doing HEP.  -referred for vestibular therapy.  -discussed current symptoms.  -discussed response to meclizine -discussed worsening symptoms when in car   Migraines: Refilled Fioricet, which is one of the medications that helps her. Refilled today. She  takes this at least once every time she gets a migraine. Advised to use upon migraine onset and to not use more frequently than q6H during migraine. Refilled Zofran for nausea last week, and this has been helping her. Continue metoprolol which can be helpful in migraine prophylaxis (HR is well controlled). Prescribed ergot nasal spray to try upon migraine initiation- advised no more frequently than 4 sprays per hour (still awaiting on prior auth). Discussed avoiding foods that may trigger migraines.  -recommended drinking a glass of water every morning before standing up as her symptoms sound like orthostatic hypotension which could worsen migraines -recommended checking blood pressure daily in supine, sitting, and standing positions and this should help Bradley identify if symptoms are from orthostatic hypotension or intracranial hypertension -discussed trying daily water first before trying medication acetazolamide.  -continue vitamin D  -Discussed that I can provide refill for her Lyrica when she needs it.  -topamax was not effective.  -When she gets the  migraines her loss of words is getting worse.  -Ordered Vyepti- she will find out cost from her pharmacy. She will bring paperwork for this next visit. She is excited to try it.  -prescribed Ubrelvy '50mg'$ , discussed that she can use this to break migraine, and if migraine persists after 2 hours, she can take another dose. Discussed that she should not take more than 2 doses in 24 hours -discussed that Roselyn Meier has a safer side effect profile than the Triptans, encouraged to use instead of Maxaalt.  -continue ear piercing. Pain feels more like pressure and less acidic.  -no benefits from Botox.  -continue to track migraines.  -Continue Baclofen for pain relief. Minimize use of Hydrocodone. She is only taking Lyrica once per day to make it last. Continue Migranal which is helping. Will occipital nerve block -Discussed that Mrs. Camuso's greatest source of happiness is her family. This community will be essential in helping her recover from her chronic pain and to increase her daily activity. Her daughter is also suffering from similar migraines unfortunately.  -Continue ginger, herbs, turmeric, blueberries, eating real food. Continue cutting down sugar. Using local honey is a great alternative. -Provided with a pain relief journal and discussed that it contains foods and lifestyle tips to naturally help to improve pain. Discussed that these lifestyle strategies are also very good for health unlike some medications which can have negative side effects. Discussed that the act of keeping a journal can be therapeutic and helpful to realize patterns what helps to trigger and alleviate pain.   -Continue Nurtec every other day- may use for breakthrough migraines as well.  -Discussed plan for Vypeti at [facility] infusion center.  Discussed that exercise is one of the most effective treatments for fibromyalgia. This will also help with her obesity. Made goal with Mrs. Formby to walk outside her home at least once  per day, and to garden at least once per day (her favorite activity). Can use elliptical which she has at home on rainy days. The heat has been oppressive and so she has been trying to do the latter more.  Foods to alleviate migraine:  1) dark leafy greens 2) avocado 3) tuna 4) samon and mackerel 5) beans and legumes Supplements that can be helpful: feverfew, B12, and magnesium  Foods to avoid in migraine: 1) Excessive (or irregular timing) coffee 2) red wine 3) aged cheeses 4) chocolate 5) citrus fruits 6) aspartame and other artifical sweeteners 7) yeast 8) MSG (in processed foods) 9)  processed and cured meats 10) nuts and certain seeds 11) chicken livers and other organ meats 12) dairy products like buttermilk, sour cream, and yogurt 13) dried fruits like dates, figs, and raisins 14) garlic 15) onions 16) potato chips 17) pickled foods like olives and sauerkraut 18) some fresh fruits like ripe banana, papaya, red plums, raspberries, kiwi, pineapple 19) tomato-based products  Recommend to keep a migraine diary: rate daily the severity of your headache (1-10) and what foods you eat that day to help determine patterns.    Cervical facet arthrosis: Cervical XR normal- discussed results with patient. Pain is worse on left side.   Stress: -Her daughter was recently diagnosed with sarcoma and this is a great source of stress and fear to Mrs. [name]. It has been a turbulent time for her and this has understandably worsened her symptoms. Discussed benefits of gratitude journaling and she plans to try this.   Insomnia:  -continue amitirptyline to '50mg'$ .  -Try to go outside near sunrise -Get exercise during the day.  -Discussed good sleep hygiene: turning off all devices an hour before bedtime.  -Chamomile tea with dinner.  -Can consider over the counter melatonin -discussed magnesium  Nausea:  -continue zofran -start B6 -discussed scopolamine patch, discussed to place behind  ear and lave on 72 hours.    Cervical myofascial pain syndrome: Will try some trigger point injections with occipital nerve block next visit.   IBS: -continue kombucha, yogurt (she makes her own)  Trigeminal Neuralgia: - continue Carbamazepine.  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 Letta Pate for ESI unchanged. Shallow broad-based disc protrusion L4-5 unchanged. -continue Norco -continue turmeric Turmeric to reduce inflammation--can be used in cooking or taken as a supplement.  Benefits of turmeric:  -Highly anti-inflammatory  -Increases antioxidants  -Improves memory, attention, brain disease  -Lowers risk of heart disease  -May help prevent cancer  -Decreases pain  -Alleviates depression  -Delays aging and decreases risk of chronic disease  -Consume with black pepper to increase absorption    Turmeric Milk Recipe:  1 cup milk  1 tsp turmeric  1 tsp cinnamon  1 tsp grated ginger (optional)  Black pepper (boosts the anti-inflammatory properties of turmeric).  1 tsp honey     7 minutes spent in discussion of her migraines that are worse when she sits up and stands from supine position

## 2022-05-14 ENCOUNTER — Other Ambulatory Visit: Payer: Self-pay | Admitting: Obstetrics and Gynecology

## 2022-05-14 DIAGNOSIS — Z9889 Other specified postprocedural states: Secondary | ICD-10-CM

## 2022-05-24 ENCOUNTER — Other Ambulatory Visit (HOSPITAL_COMMUNITY): Payer: Self-pay

## 2022-05-26 NOTE — Progress Notes (Unsigned)
PROVIDER NOTE: Information contained herein reflects review and annotations entered in association with encounter. Interpretation of such information and data should be left to medically-trained personnel. Information provided to patient can be located elsewhere in the medical record under "Patient Instructions". Document created using STT-dictation technology, any transcriptional errors that may result from process are unintentional.    Patient: Dana Bradley  Service Category: E/M  Provider: Gaspar Cola, MD  DOB: [birth year]  DOS: 05/27/2022  Referring Provider: Laurell Roof  MRN: [id number]  Specialty: Interventional Pain Management  PCP: Delsa Grana, PA-C  Type: Established Patient  Setting: Ambulatory outpatient    Location: Office  Delivery: Face-to-face     HPI  Dana Bradley, a 51 y.o. year old female, is here today because of her No primary diagnosis found.. Dana Bradley's primary complain today is No chief complaint on file. Last encounter: My last encounter with her was on 03/04/2022. Pertinent problems: Dana Bradley has Cervico-occipital neuralgia; DDD (degenerative disc disease), lumbosacral; Chronic pain of multiple joints; DDD (degenerative disc disease), lumbar; Fibromyalgia; Migraine without aura and with status migrainosus, not intractable; Chronic lower extremity cramps (Bilateral) (R>L); Chronic pain syndrome; Neurogenic pain; Chronic low back pain (1ry area of Pain) (Bilateral) (R>L) (midline); Chronic upper back pain (2ry area of Pain) (Bilateral) (L>R); Chronic abdominal pain (Right lower quadrant); Thoracic radiculitis (Bilateral: T10, T11); Chronic occipital neuralgia (3ry area of Pain) (Bilateral) (L>R); Chronic neck pain; Chronic cervical radicular pain (Bilateral) (L>R); Chronic shoulder blade pain (Bilateral) (L>R); Chronic upper extremity pain (Bilateral) (R>L); Chronic knee pain (Bilateral) (R>L); Chronic ankle pain (Bilateral); Cervical spondylosis with myelopathy and  radiculopathy; Nephrolithiasis; Plantar fasciitis of left foot; Cervicogenic headache; Bilateral leg edema; Chronic hip pain (Left); Chronic sacroiliac joint pain (Left); Lumbar facet syndrome (Bilateral) (L>R); Lumbar radiculitis (Left); Lumbar spondylosis; Migraine headache; Muscle spasticity; Osteoarthritis of shoulder (Bilateral); Lumbar L1-2 disc protrusion (Right); Myofascial pain syndrome (Left) (trapezius muscle); Trigger point of shoulder region (Left); Chronic fatigue syndrome with fibromyalgia; Occipital headache; Trigger point of neck (Left); Malar rash; Chronic migraine without aura, with intractable migraine, so stated, with status migrainosus; Weakness of leg (Left); Difficulty walking; Migraine with aura and with status migrainosus, not intractable; Cervico-occipital neuralgia (Left); DDD (degenerative disc disease), cervical; Cervical facet syndrome (Bilateral) (L>R); DDD (degenerative disc disease), thoracic; Osteoarthritis of hip (Left); Chronic groin pain (Bilateral) (L>R); Chronic hip pain (Bilateral) (L>R); Somatic dysfunction of sacroiliac joint (Bilateral); Other specified dorsopathies, sacral and sacrococcygeal region; Intractable migraine with aura without status migrainosus; Chronic lower extremity pain (Bilateral); Lumbar radiculitis (Right); Neurogenic urinary incontinence; Other intervertebral disc degeneration, lumbar region; Spinal stenosis of lumbosacral region; Hyperalgesia; Abnormal MRI, lumbar spine (12/27/2021); Chronic midline thoracic back pain; Abnormal MRI, thoracic spine (08/02/2019); Thoracic spinal stenosis (T7-8); Prolapse of thoracic disc with radiculopathy (T7-8); Spasm of muscle of lower back; Cervicalgia; Abnormal MRI, cervical spine (02/14/2021); Lateral epicondylitis, left elbow; Neurogenic bladder; and Urinary and fecal incontinence on their pertinent problem list. Pain Assessment: Severity of   is reported as a  /10. Location:    / . Onset:  . Quality:  . Timing:   . Modifying factor(s):  Marland Kitchen Vitals:  vitals were not taken for this visit.   Reason for encounter:  *** . ***  Pharmacotherapy Assessment  Analgesic: Hydrocodone/APAP 7.5/325, 1 tab PO QD. she appears to be using less than 10 pills/month. MME/day: 3.75 mg/day.   Monitoring: [place] PMP: PDMP reviewed during this encounter.       Pharmacotherapy: No side-effects or adverse reactions  reported. Compliance: No problems identified. Effectiveness: Clinically acceptable.  No notes on file  No results found for: "CBDTHCR" No results found for: "D8THCCBX" No results found for: "D9THCCBX"  UDS:  Summary  Date Value Ref Range Status  01/28/2022 Note  Final    Comment:    ==================================================================== [facility] (MW) ==================================================================== Test                             Result       Flag       Units  Drug Present and Declared for Prescription Verification   Hydromorphone                  38           EXPECTED   ng/mg creat    Hydromorphone may be administered as a scheduled prescription    medication; it is also an expected metabolite of hydrocodone.  Drug Absent but Declared for Prescription Verification   Lorazepam                      Not Detected UNEXPECTED ng/mg creat   Hydrocodone                    Not Detected UNEXPECTED ng/mg creat    Hydrocodone is almost always present in patients taking this drug    consistently. Absence of hydrocodone could be due to lapse of time    since the last dose or unusual pharmacokinetics (rapid metabolism).    Butalbital                     Not Detected UNEXPECTED ==================================================================== Test                      Result    Flag   Units      Ref Range   Creatinine              135              mg/dL      >=20 ==================================================================== Declared Medications:  The flagging  and interpretation on this report are based on the  following declared medications.  Unexpected results may arise from  inaccuracies in the declared medications.   **Note: The testing scope of this panel includes these medications:   Butalbital  Hydrocodone (Norco)  Lorazepam (Ativan)   **Note: The testing scope of this panel does not include the  following reported medications:   Acetaminophen  Acetaminophen (Norco)  Amitriptyline (Elavil)  Atorvastatin (Lipitor)  Baclofen (Lioresal)  Caffeine  Carbamazepine (Tegretol)  Diclofenac (Voltaren)  Dicyclomine (Bentyl)  Dihydroergotamine (Migranal)  Meclizine (Antivert)  Medroxyprogesterone (Provera)  Metformin  Metoprolol  Omeprazole  Ondansetron (Zofran)  Pancrelipase (Creon)  Pregabalin (Lyrica)  Rimegepant (Nurtec)  Vitamin B12 ==================================================================== For clinical consultation, please call [phone]. ====================================================================       ROS  Constitutional: Denies any fever or chills Gastrointestinal: No reported hemesis, hematochezia, vomiting, or acute GI distress Musculoskeletal: Denies any acute onset joint swelling, redness, loss of ROM, or weakness Neurological: No reported episodes of acute onset apraxia, aphasia, dysarthria, agnosia, amnesia, paralysis, loss of coordination, or loss of consciousness  Medication Review  Eptinezumab-jjmr, HYDROcodone-acetaminophen, LORazepam, Needles & Syringes, Rimegepant Sulfate, Valerian, amitriptyline, atorvastatin, baclofen, butalbital-acetaminophen-caffeine, carbamazepine, cyanocobalamin, diclofenac Sodium, dicyclomine, lipase/protease/amylase, meclizine, medroxyPROGESTERone, metFORMIN, metoprolol tartrate, omeprazole, ondansetron, pregabalin, rizatriptan, and scopolamine  History Review  Allergy: Dana Bradley is allergic to aspirin, cephalexin, cymbalta [duloxetine hcl], depakote  [divalproex sodium], gadolinium derivatives, haloperidol, iodinated contrast media, meperidine, metoclopramide, morphine, penicillins, prochlorperazine, tramadol hcl, trazodone, meloxicam, neomycin-bacitracin zn-polymyx, tomato, other, shellfish allergy, shellfish-derived products, bacitracin-neomycin-polymyxin, cephalosporins, ibuprofen, latex, nsaids, sulfa antibiotics, and sulfonamide derivatives. Drug: Dana Bradley  reports no history of drug use. Alcohol:  reports no history of alcohol use. Tobacco:  reports that she quit smoking about 29 years ago. Her smoking use included cigarettes. She has a 6.00 pack-year smoking history. She has never used smokeless tobacco. Social: Dana Bradley  reports that she quit smoking about 29 years ago. Her smoking use included cigarettes. She has a 6.00 pack-year smoking history. She has never used smokeless tobacco. She reports that she does not drink alcohol and does not use drugs. Medical:  has a past medical history of Acute postoperative pain (04/07/2017), Anxiety, Bursitis, Chronic fatigue (12/12/2017), Chronic fatigue syndrome, Colitis (2021), Edema leg (05/02/2015), Fibromyalgia, GERD (gastroesophageal reflux disease), IBS (irritable bowel syndrome), Knee pain, bilateral (12/21/2008), Lumbar discitis, Migraines, Osteoarthritis, Right hand pain (04/10/2015), Sleep apnea, Spinal stenosis, SVT (supraventricular tachycardia) ([place]), Vertigo, and Vitamin D deficiency (05/01/2016). Surgical: Dana Bradley  has a past surgical history that includes Ablation; Tubal ligation (10/01/1999); Cardiac catheterization; Knee arthroscopy; Colonoscopy with propofol (N/A, 05/17/2015); Esophagogastroduodenoscopy (N/A, 05/17/2015); spg; Tibial Tubercle Bypass (Right, 1998); Colonoscopy with propofol (N/A, 03/20/2020); and Breast biopsy (Left). Family: family history includes Alcohol abuse in her father; Alzheimer's disease in her paternal grandmother and another family member; Aneurysm in her maternal  grandfather; Anxiety disorder in her father, mother, sister, and sister; Arthritis in her maternal grandmother; Bipolar disorder in her sister; Breast cancer (age of onset: 18) in her maternal grandmother; COPD in her paternal grandfather; Cancer in her mother; Cancer (age of onset: 62) in her maternal grandmother; Depression in her father, mother, sister, and sister; Diabetes in her sister; Heart attack in her paternal grandfather; Heart disease in her father, maternal grandfather, and paternal grandfather; Hyperlipidemia in her maternal grandmother, mother, and sister; Hypertension in her father, maternal grandfather, mother, paternal grandfather, sister, and sister; Migraines in her daughter, daughter, mother, sister, sister, and son; Other in her daughter; Polycystic ovary syndrome in her sister; Stroke in her father; Thyroid disease in her maternal grandmother.  Laboratory Chemistry Profile   Renal Lab Results  Component Value Date   BUN 9 12/27/2021   CREATININE 0.68 12/27/2021   BCR 13 12/27/2021   GFRAA >60 02/22/2020   GFRNONAA >60 07/06/2021    Hepatic Lab Results  Component Value Date   AST 28 07/06/2021   ALT 37 07/06/2021   ALBUMIN 4.2 07/06/2021   ALKPHOS 109 07/06/2021   HCVAB NON REACTIVE 09/21/2019    Electrolytes Lab Results  Component Value Date   NA 140 12/27/2021   K 4.2 12/27/2021   CL 103 12/27/2021   CALCIUM 9.7 12/27/2021   MG 2.1 04/01/2017    Bone Lab Results  Component Value Date   VD25OH 16.49 (L) 02/22/2020   25OHVITD1 20 (L) 04/01/2017   25OHVITD2 5.4 04/01/2017   25OHVITD3 15 04/01/2017    Inflammation (CRP: Acute Phase) (ESR: Chronic Phase) Lab Results  Component Value Date   CRP 6.0 07/30/2018   ESRSEDRATE 17 07/30/2018         Note: Above Lab results reviewed.  Recent Imaging Review  MR THORACIC SPINE WO CONTRAST CLINICAL DATA:  Chronic bilateral low back pain with bilateral sciatica. Evaluation for spinal cord  stimulator.  EXAM: MRI THORACIC SPINE WITHOUT CONTRAST  TECHNIQUE: Multiplanar, multisequence MR imaging of the thoracic spine was performed. No intravenous contrast was administered.  COMPARISON:  Thoracic spine MRI 08/01/2019  FINDINGS: Alignment:  Normal.  Vertebrae: Mild chronic nonspecific bone matter heterogeneity, similar to the prior study. No destructive lesion, fracture, or significant marrow edema.  Cord:  Normal signal.  Paraspinal and other soft tissues: Unremarkable.  Disc levels:  At T7-8, a moderate-sized right central disc protrusion results in mild spinal stenosis and moderately flattens the spinal cord, unchanged. A small central disc protrusion at T12-L1 is unchanged and without spinal cord mass effect or stenosis. Assessment of L1-2 on sagittal images demonstrates an unchanged right paracentral disc herniation without significant stenosis. There is scattered mild to moderate facet arthrosis throughout the thoracic spine without neural foraminal stenosis.  IMPRESSION: Unchanged thoracic disc degeneration, most notable at T7-8 where a disc protrusion results in mild spinal stenosis and moderate cord flattening.  Electronically Signed   By: Logan Bores M.D.   On: 03/29/2022 16:32 Note: Reviewed        Physical Exam  General appearance: Well nourished, well developed, and well hydrated. In no apparent acute distress Mental status: Alert, oriented x 3 (person, place, & time)       Respiratory: No evidence of acute respiratory distress Eyes: PERLA Vitals: There were no vitals taken for this visit. BMI: Estimated body mass index is 41.17 kg/m as calculated from the following:   Height as of 04/04/22: 5' 3" (1.6 m).   Weight as of 04/04/22: 232 lb 6.4 oz (105.4 kg). Ideal: Patient weight not recorded  Assessment   Diagnosis Status  No diagnosis found. Controlled Controlled Controlled   Updated Problems: No problems updated.  Plan of Care   Problem-specific:  No problem-specific Assessment & Plan notes found for this encounter.  Dana Bradley has a current medication list which includes the following long-term medication(s): amitriptyline, atorvastatin, baclofen, diclofenac sodium, dicyclomine, hydrocodone-acetaminophen, hydrocodone-acetaminophen, hydrocodone-acetaminophen, medroxyprogesterone, metformin, metoprolol tartrate, needles & syringes, omeprazole, pregabalin, rizatriptan, and tegretol-xr.  Pharmacotherapy (Medications Ordered): No orders of the defined types were placed in this encounter.  Orders:  No orders of the defined types were placed in this encounter.  Follow-up plan:   No follow-ups on file.     Interventional Therapies  Risk  Complexity Considerations:   Estimated body mass index is 38.97 kg/m as calculated from the following:   Height as of 02/13/21: 5' 3" (1.6 m).   Weight as of 02/13/21: 220 lb (99.8 kg). Allergy to contrast. Allergy to latex. Allergy to NSAIDs. History of vasovagal episodes with spinal manipulation.   Planned  Pending:   Referral to Med Psych for SCS implant evaluation   Under consideration:   It would appear that this last therapeutic left C7-T1 cervical ESI # 3 (02/13/2021) did not provide her with any significant relief of the pain.  However, her very first cervical epidural steroid injection was done on 09/11/2016 and again caused a flareup of her pain.  Follow-up assessment indicated 80/60/70/> 50.  The procedure was then repeated on 10/30/2016 and although the patient did not keep up with the follow-up on that procedure, it would seem that her pain got under control and did not need any further interventions in the cervical region until 12/25/2017 when she had a left occipital nerve (Left) C2/TON RFA.  Unfortunately, the patient is rather noncompliant with follow-up evaluations after diagnostic and therapeutic procedures and therefore the information  collected on those results  is not available to contribute towards obtaining better results. Therapeutic left cervical ESI #4  Therapeutic right L4-5 LESI #2    Completed:   Neurosurgical referral for decompressive laminectomy and/or discectomy Therapeutic left C7-T1 cervical ESI x2 (10/30/2016)  Therapeutic bilateral lumbar facet block x1 (01/06/2017)  Therapeutic left SI joint block x2 (08/03/2019)  Therapeutic right L1-2 LESI x1 (12/16/2017)  Therapeutic left L1-2 LESI x1 (04/07/2017)  Therapeutic bilateral L2 TFESI x2 (11/09/2020)  Therapeutic left L4-5 LESI x1 (09/07/2019)  Therapeutic left trapezius muscle TPI/MNB x3 (03/19/2018)  Therapeutic right trapezius muscle trigger point injection x1 (11/18/2017)  Therapeutic left occipital nerve RFA x1 (12/25/2017)  Therapeutic left C2 + TON RFA x1 (12/25/2017)  Therapeutic left caudal ESI x1 (09/28/2019)  Therapeutic right T8-9 thoracic ESI x1 (11/09/2020)    Therapeutic  Palliative (PRN) options:   Diagnostic left SI joint block #2  Diagnostic/therapeutic left IA hip joint injection #2  Diagnostic bilateral lumbar facet block #2  Palliative left trapezius muscle MNB #4  Palliative right trapezius muscle MNB #2  Palliative left L1-2 LESI #2  Palliative right L1-2 LESI #2  Palliative left CESI #3  Palliative left greater occipital NB #3  Palliative left C2 + TON NB #2  Palliative left GON + C2 + TON RFA #2 (last done 12/25/2017) Diagnostic/therapeutic bilateral L2 TFESI #2 (done on 03/07/2020) (100/100/100/>50)     Recent Visits Date Type Provider Dept  03/04/22 Office Visit Milinda Pointer, MD [facility]  Showing recent visits within past 90 days and meeting all other requirements Future Appointments Date Type Provider Dept  05/27/22 Appointment Milinda Pointer, MD [facility]  Showing future appointments within next 90 days and meeting all other requirements  I discussed the assessment and treatment plan with the patient. The patient was  provided an opportunity to ask questions and all were answered. The patient agreed with the plan and demonstrated an understanding of the instructions.  Patient advised to call back or seek an in-person evaluation if the symptoms or condition worsens.  Duration of encounter: *** minutes.  Total time on encounter, as per AMA guidelines included both the face-to-face and non-face-to-face time personally spent by the physician and/or other qualified health care professional(s) on the day of the encounter (includes time in activities that require the physician or other qualified health care professional and does not include time in activities normally performed by clinical staff). Physician's time may include the following activities when performed: preparing to see the patient (eg, review of tests, pre-charting review of records) obtaining and/or reviewing separately obtained history performing a medically appropriate examination and/or evaluation counseling and educating the patient/family/caregiver ordering medications, tests, or procedures referring and communicating with other health care professionals (when not separately reported) documenting clinical information in the electronic or other health record independently interpreting results (not separately reported) and communicating results to the patient/ family/caregiver care coordination (not separately reported)  Note by: Gaspar Cola, MD Date: 05/27/2022; Time: 8:51 AM

## 2022-05-27 ENCOUNTER — Encounter: Payer: Self-pay | Admitting: Pain Medicine

## 2022-05-27 ENCOUNTER — Ambulatory Visit: Payer: Medicaid Other | Attending: Pain Medicine | Admitting: Pain Medicine

## 2022-05-27 VITALS — BP 126/83 | HR 97 | Temp 98.1°F | Resp 16 | Ht 63.0 in | Wt 225.0 lb

## 2022-05-27 DIAGNOSIS — G894 Chronic pain syndrome: Secondary | ICD-10-CM | POA: Diagnosis not present

## 2022-05-27 DIAGNOSIS — M19012 Primary osteoarthritis, left shoulder: Secondary | ICD-10-CM | POA: Diagnosis not present

## 2022-05-27 DIAGNOSIS — M5481 Occipital neuralgia: Secondary | ICD-10-CM | POA: Diagnosis not present

## 2022-05-27 DIAGNOSIS — Z79899 Other long term (current) drug therapy: Secondary | ICD-10-CM | POA: Diagnosis not present

## 2022-05-27 DIAGNOSIS — M19011 Primary osteoarthritis, right shoulder: Secondary | ICD-10-CM | POA: Insufficient documentation

## 2022-05-27 DIAGNOSIS — M62838 Other muscle spasm: Secondary | ICD-10-CM | POA: Diagnosis not present

## 2022-05-27 DIAGNOSIS — G8929 Other chronic pain: Secondary | ICD-10-CM | POA: Diagnosis not present

## 2022-05-27 DIAGNOSIS — M549 Dorsalgia, unspecified: Secondary | ICD-10-CM | POA: Diagnosis not present

## 2022-05-27 DIAGNOSIS — M5441 Lumbago with sciatica, right side: Secondary | ICD-10-CM | POA: Diagnosis not present

## 2022-05-27 DIAGNOSIS — M5442 Lumbago with sciatica, left side: Secondary | ICD-10-CM | POA: Insufficient documentation

## 2022-05-27 DIAGNOSIS — Z79891 Long term (current) use of opiate analgesic: Secondary | ICD-10-CM | POA: Diagnosis not present

## 2022-05-27 MED ORDER — HYDROCODONE-ACETAMINOPHEN 7.5-325 MG PO TABS: 1.0000 | ORAL_TABLET | Freq: Two times a day (BID) | ORAL | 0 refills | Status: DC | PRN

## 2022-05-27 NOTE — Progress Notes (Signed)
Nursing Pain Medication Assessment:  Safety precautions to be maintained throughout the outpatient stay will include: orient to surroundings, keep bed in low position, maintain call bell within reach at all times, provide assistance with transfer out of bed and ambulation.  Medication Inspection Compliance: Pill count conducted under aseptic conditions, in front of the patient. Neither the pills nor the bottle was removed from the patient's sight at any time. Once count was completed pills were immediately returned to the patient in their original bottle.  Medication: Hydrocodone/APAP Pill/Patch Count:  43 of 45 pills remain Pill/Patch Appearance: Markings consistent with prescribed medication Bottle Appearance: Standard pharmacy container. Clearly labeled. Filled Date: 08 / 25 / 2023 Last Medication intake:  Yesterday

## 2022-05-27 NOTE — Patient Instructions (Signed)

## 2022-06-07 ENCOUNTER — Encounter
Payer: Medicaid Other | Attending: Physical Medicine and Rehabilitation | Admitting: Physical Medicine and Rehabilitation

## 2022-06-07 ENCOUNTER — Other Ambulatory Visit (HOSPITAL_COMMUNITY): Payer: Self-pay

## 2022-06-07 VITALS — BP 106/74 | HR 101 | Ht 63.0 in | Wt 230.2 lb

## 2022-06-07 DIAGNOSIS — G500 Trigeminal neuralgia: Secondary | ICD-10-CM

## 2022-06-07 DIAGNOSIS — F329 Major depressive disorder, single episode, unspecified: Secondary | ICD-10-CM | POA: Diagnosis not present

## 2022-06-07 DIAGNOSIS — G43001 Migraine without aura, not intractable, with status migrainosus: Secondary | ICD-10-CM | POA: Diagnosis not present

## 2022-06-07 DIAGNOSIS — F439 Reaction to severe stress, unspecified: Secondary | ICD-10-CM

## 2022-06-07 MED ORDER — BUTALBITAL-APAP-CAFFEINE 50-325-40 MG PO TABS
1.0000 | ORAL_TABLET | ORAL | 3 refills | Status: DC | PRN
Start: 2022-06-07 — End: 2023-02-04

## 2022-06-07 MED ORDER — BUPROPION HCL ER (SR) 100 MG PO TB12: 100.0000 mg | ORAL_TABLET | Freq: Two times a day (BID) | ORAL | 2 refills | Status: DC

## 2022-06-07 NOTE — Progress Notes (Signed)
Subjective:    Patient ID: Dana Bradley, female    DOB: May 18, 1971, 51 y.o.   MRN: [id number]  HPI    1) Chronic Migraine:  -Dana Bradley [place] a 51 year old woman who presents for f/u of her chronic migraine  -she feels she is getting a migraine every day -the Fioricet and Nurtec or Maxaalt together give her relief, but not complete relief.  -the days run into each other and its hard to remember the differences between them -she has been stressed in caring for her mother -she has noted recently that when she lays flat she feels fine but as soon as she sits up she feels an excruciating migraine -migraine continues to worsen from sitting to standing position -her daughter has similar symptoms but felt better after starting acetazolamide for presumed diagnosis of intracranial hypertension. She asks whether she may benefit from this medication as well.  Terrible stomach spasms and diarrhea. 12 hours later debilitating migraine. It usually starts in the afternoon. She feels that this may be part of her prodrome. She also has terrible vertigo that is getting worse. The Fiorcet helps a lot, the Migronal helps with the frontal headache. These make the pain bearable.  -having migraines close to every 24H -she is not functional -she is not sure if Maxaalt is helping. She is not taking with the Nurtec. It is helping some but she is not sure how much. The fioricet alone helps by putting her to sleep.  -she has not heart any news about the Vypeti infusion. The medication was approved  -has been having more dizziness.  -the pain has been worst since she had the occipital nerve blocks.  -she knows it is stress related -she feels a pain radiating down her arm as well. It also runs into her head and into her face.  -Sometimes her whole left side is fizzy.  -she has tried Amitriptyline.  -have been awful since December. Had a migraine from Dec 1st throughout 9th.  -the day before she was incapacitated.   -Vyepti has been approved -The nurtec and the Fiorocet help together to break the migraine.  -she needs a refill of her Fioricet and nausea, she finds this helpful.  -Nurtec- she is still working with the company to get this improved -she asks about prodrome and postdrome (she experiences cramping and diarrhea).  -on Sunday she closed out a 51 hour migraines. -she has been trying to follow ketogenic diet.  -Nurtec helping- 5 days in a row without a migraines.  -she feels if she could take it as a rescue medication in addition to every other day this may help.   2) Vertigo -Symptoms are worsening.  -She feels that this may be leading up to the vertigo.  -She is interested in vestibular therapy.  -she had a heating pad on the shoulder and arm.   3) Cervical myofasical pain syndrome: -Muscles of neck are very tight, pain is worse on the left side.  -She did not find spinal injections helpful, they made the pain worse.   4) Insomnia: she sleeps in chunks -she is willing to increase amitriptyline to '50mg'$ .   5) Cognitive impairments: -she loves to cook and noticed she has been making more mistakes -she thinks the headaches are a problem.  6) Pain radiating from back into legs -feels twitches -worse at night -legs kick out at night.   7) IBS -terrible in the night -2am on Sunday. -horrible pain -often with diarrhea and  improves symptoms after she has diarrhea.  -cramping.   8) Stress  Dana Bradley is a 51 year old woman who presents for follow-up of her chronic migraine. Her average pain is 6/10 and pain right now is 6/10. Her pain is intermittent, sharp, burning, stabbing, tingling, and aching.   She recently had 7 days without migraine. She is not sure what was associated with this. She had a pretty terrible bounceback.  She picked up the Migranal nasal spray. Our CMA explained how it works.   Her pain doctor is going to stop prescribing the Lyrica and she needs to find a  new doctor to prescribe this.    Pain Inventory Average Pain 6 Pain Right Now 6 My pain is constant, sharp, burning, stabbing, tingling, and aching  In the last 24 hours, has pain interfered with the following? General activity 7 Relation with others 8 Enjoyment of life 8 What TIME of day is your pain at its worst? morning , evening, and night Sleep (in general) Poor  Pain is worse with: walking, bending, standing, and some activites Pain improves with: rest, medication, and TENS Relief from Meds: 5     Family History  Problem Relation Age of Onset   Depression Mother    Hypertension Mother    Cancer Mother        Skin   Hyperlipidemia Mother    Anxiety disorder Mother    Migraines Mother    Alcohol abuse Father    Depression Father    Stroke Father    Heart disease Father    Hypertension Father    Anxiety disorder Father    Depression Sister    Hyperlipidemia Sister    Diabetes Sister    Hypertension Sister    Polycystic ovary syndrome Sister    Bipolar disorder Sister    Anxiety disorder Sister    Migraines Sister    Depression Sister    Hypertension Sister    Anxiety disorder Sister    Migraines Sister    Breast cancer Maternal Grandmother 60   Cancer Maternal Grandmother 63       Breast   Thyroid disease Maternal Grandmother    Arthritis Maternal Grandmother    Hyperlipidemia Maternal Grandmother    Aneurysm Maternal Grandfather    Hypertension Maternal Grandfather    Heart disease Maternal Grandfather    Alzheimer's disease Paternal Grandmother    Heart attack Paternal Grandfather    Hypertension Paternal Grandfather    COPD Paternal Grandfather    Heart disease Paternal Grandfather    Migraines Daughter    Other Daughter        leomyoa scaroma   Migraines Daughter    Migraines Son    Alzheimer's disease Other    Bladder Cancer Neg Hx    Kidney cancer Neg Hx    Social History   Socioeconomic History   Marital status: Married    Spouse  name: brian   Number of children: 3   Years of education: Not on file   Highest education level: Associate degree: academic program  Occupational History   Occupation: disbled    Comment: not able  Tobacco Use   Smoking status: Former    Packs/day: 2.00    Years: 3.00    Total pack years: 6.00    Types: Cigarettes    Quit date: 12/10/1992    Years since quitting: 29.5   Smokeless tobacco: Never   Tobacco comments:    quit 25 years ago  Vaping Use   Vaping Use: Never used  Substance and Sexual Activity   Alcohol use: No    Comment: socially   Drug use: No   Sexual activity: Not Currently    Partners: Male    Birth control/protection: Surgical  Other Topics Concern   Not on file  Social History Narrative   Lives at home with her husband and 2 of her children   Right handed   Caffeine: 0-2 cups daily   Social Determinants of Health   Financial Resource Strain: High Risk (08/29/2017)   Overall Financial Resource Strain (CARDIA)    Difficulty of Paying Living Expenses: Very hard  Food Insecurity: Food Insecurity Present (08/29/2017)   Hunger Vital Sign    Worried About Running Out of Food in the Last Year: Often true    Ran Out of Food in the Last Year: Often true  Transportation Needs: Unmet Transportation Needs (08/29/2017)   PRAPARE - Hydrologist (Medical): Yes    Lack of Transportation (Non-Medical): Yes  Physical Activity: Inactive (08/29/2017)   Exercise Vital Sign    Days of Exercise per Week: 0 days    Minutes of Exercise per Session: 0 min  Stress: Stress Concern Present (08/29/2017)   [employer] of Stress : Rather much  Social Connections: Somewhat Isolated (08/29/2017)   Social Connection and Isolation Panel [NHANES]    Frequency of Communication with Friends and Family: Never    Frequency of Social Gatherings with Friends and Family: Never     Attends Religious Services: More than 4 times per year    Active Member of Clubs or Organizations: No    Attends Archivist Meetings: Never    Marital Status: Married   Past Surgical History:  Procedure Laterality Date   ABLATION     Uterine   BREAST BIOPSY Left    2014 U/S bx fibroadenoma   CARDIAC CATHETERIZATION     with ablation   COLONOSCOPY WITH PROPOFOL N/A 05/17/2015   Procedure: COLONOSCOPY WITH PROPOFOL;  Surgeon: Manya Silvas, MD;  Location: [place];  Service: Endoscopy;  Laterality: N/A;   COLONOSCOPY WITH PROPOFOL N/A 03/20/2020   Procedure: COLONOSCOPY WITH PROPOFOL;  Surgeon: Jonathon Bellows, MD;  Location: [facility] ENDOSCOPY;  Service: Gastroenterology;  Laterality: N/A;   ESOPHAGOGASTRODUODENOSCOPY N/A 05/17/2015   Procedure: ESOPHAGOGASTRODUODENOSCOPY (EGD);  Surgeon: Manya Silvas, MD;  Location: [facility] ENDOSCOPY;  Service: Endoscopy;  Laterality: N/A;   KNEE ARTHROSCOPY     spg     6/18   Tibial Tubercle Bypass Right 1998   TUBAL LIGATION  10/01/1999   Past Surgical History:  Procedure Laterality Date   ABLATION     Uterine   BREAST BIOPSY Left    2014 U/S bx fibroadenoma   CARDIAC CATHETERIZATION     with ablation   COLONOSCOPY WITH PROPOFOL N/A 05/17/2015   Procedure: COLONOSCOPY WITH PROPOFOL;  Surgeon: Manya Silvas, MD;  Location: [facility] ENDOSCOPY;  Service: Endoscopy;  Laterality: N/A;   COLONOSCOPY WITH PROPOFOL N/A 03/20/2020   Procedure: COLONOSCOPY WITH PROPOFOL;  Surgeon: Jonathon Bellows, MD;  Location: [facility] ENDOSCOPY;  Service: Gastroenterology;  Laterality: N/A;   ESOPHAGOGASTRODUODENOSCOPY N/A 05/17/2015   Procedure: ESOPHAGOGASTRODUODENOSCOPY (EGD);  Surgeon: Manya Silvas, MD;  Location: [facility] ENDOSCOPY;  Service: Endoscopy;  Laterality: N/A;   KNEE ARTHROSCOPY     spg     6/18   Tibial Tubercle  Bypass Right 1998   TUBAL LIGATION  10/01/1999   Past Medical History:  Diagnosis Date   Acute postoperative pain 04/07/2017    Anxiety    Bursitis    Chronic fatigue 12/12/2017   Chronic fatigue syndrome    Colitis 2021   Edema leg 05/02/2015   Fibromyalgia    GERD (gastroesophageal reflux disease)    IBS (irritable bowel syndrome)    Knee pain, bilateral 12/21/2008   Qualifier: Diagnosis of  By: Hassell Done FNP, [place]     Lumbar discitis    Migraines    Osteoarthritis    Right hand pain 04/10/2015   [facility] has done nerve conduction studies and ruled out carpal tunnel.    Sleep apnea    Spinal stenosis    SVT (supraventricular tachycardia) (HCC)    Vertigo    Vitamin D deficiency 05/01/2016   BP 106/74   Pulse (!) 101   Ht '5\' 3"'$  (1.6 m)   Wt 230 lb 3.2 oz (104.4 kg)   SpO2 94%   BMI 40.78 kg/m   Opioid Risk Score:   Fall Risk Score:  `1  Depression screen [facility] 2/9     06/07/2022    9:25 AM 05/27/2022    9:45 AM 04/04/2022    1:35 PM 03/04/2022    2:44 PM 02/28/2022    9:33 AM 02/11/2022   10:16 AM 01/28/2022    8:17 AM  Depression screen PHQ 2/9  Decreased Interest 0 0 2 0 0 3 0  Down, Depressed, Hopeless 0 0 2 0 0 3 0  PHQ - 2 Score 0 0 4 0 0 6 0  Altered sleeping   2      Tired, decreased energy   3      Change in appetite   0      Feeling bad or failure about yourself    2      Trouble concentrating   2      Moving slowly or fidgety/restless   0      Suicidal thoughts   0      PHQ-9 Score   13      Difficult doing work/chores   Somewhat difficult       Review of Systems  Constitutional: Negative.   HENT: Negative.    Eyes: Negative.   Respiratory: Negative.    Cardiovascular: Negative.   Gastrointestinal: Negative.   Endocrine: Negative.   Genitourinary: Negative.   Musculoskeletal:  Positive for back pain.       Left side of body from head to toes  Skin: Negative.   Allergic/Immunologic: Negative.   Neurological:  Positive for dizziness and headaches.  Hematological: Negative.   Psychiatric/Behavioral: Negative.    All other systems reviewed and are negative.       Objective:   Physical Exam Gen: no distress, normal appearing HEENT: oral mucosa pink and moist, NCAT Cardio: Reg rate Chest: normal effort, normal rate of breathing Abd: soft, non-distended Ext: no edema Psych: pleasant, normal affect Skin: intact Neuro: Alert and oriented x3, currently having a migraine and needs to be in a dark room     Assessment & Plan:  Mrs. Wojtaszek is a 51 year old woman who presents for f/uwith chronic intractable migraines s/p numerous treatments, severe fibromyalgia, and IBS, and nausea, and vertigo.     Vertigo: In the future, restart for cervical myofascial pain syndrome: myofascial release, postural correction, stretching and strengthening of the muscles of the neck  and upper back, development of HEP. Conitnue heating pads to muscles of upper back and neck. She is doing HEP.  -referred for vestibular therapy.  -discussed current symptoms.  -discussed response to meclizine -discussed worsening symptoms when in car   Migraines: Refilled Fioricet, which is one of the medications that helps her. Refilled today. She takes this at least once every time she gets a migraine. Advised to use upon migraine onset and to not use more frequently than q6H during migraine. Refilled Zofran for nausea last week, and this has been helping her. Continue metoprolol which can be helpful in migraine prophylaxis (HR is well controlled). Prescribed ergot nasal spray to try upon migraine initiation- advised no more frequently than 4 sprays per hour (still awaiting on prior auth). Discussed avoiding foods that may trigger migraines.  -recommended drinking a glass of water every morning before standing up as her symptoms sound like orthostatic hypotension which could worsen migraines -recommended checking blood pressure daily in supine, sitting, and standing positions and this should help [place] identify if symptoms are from orthostatic hypotension or intracranial hypertension -discussed  trying daily water first before trying medication acetazolamide.  -continue vitamin D  -Discussed that I can provide refill for her Lyrica when she needs it.  -topamax was not effective.  -When she gets the migraines her loss of words is getting worse.  -Ordered Vyepti- she will find out cost from her pharmacy. She will bring paperwork for this next visit. She is excited to try it.  -continue Nurtec  -continue ear piercing. Pain feels more like pressure and less acidic.  -no benefits from Botox.  -continue to track migraines.  -Continue Baclofen for pain relief. Minimize use of Hydrocodone. She is only taking Lyrica once per day to make it last. Continue Migranal which is helping. Will occipital nerve block -Discussed that Mrs. Falcon's greatest source of happiness is her family. This community will be essential in helping her recover from her chronic pain and to increase her daily activity. Her daughter is also suffering from similar migraines unfortunately.  -Continue ginger, herbs, turmeric, blueberries, eating real food. Continue cutting down sugar. Using local honey is a great alternative. -Provided with a pain relief journal and discussed that it contains foods and lifestyle tips to naturally help to improve pain. Discussed that these lifestyle strategies are also very good for health unlike some medications which can have negative side effects. Discussed that the act of keeping a journal can be therapeutic and helpful to realize patterns what helps to trigger and alleviate pain.   -Continue Nurtec every other day- may use for breakthrough migraines as well.  -Discussed plan for Vypeti at [facility] infusion center.  Discussed that exercise is one of the most effective treatments for fibromyalgia. This will also help with her obesity. Made goal with Mrs. Fraley to walk outside her home at least once per day, and to garden at least once per day (her favorite activity). Can use elliptical which  she has at home on rainy days. The heat has been oppressive and so she has been trying to do the latter more.  Foods to alleviate migraine:  1) dark leafy greens 2) avocado 3) tuna 4) samon and mackerel 5) beans and legumes Supplements that can be helpful: feverfew, B12, and magnesium  Foods to avoid in migraine: 1) Excessive (or irregular timing) coffee 2) red wine 3) aged cheeses 4) chocolate 5) citrus fruits 6) aspartame and other artifical sweeteners 7) yeast 8) MSG (  in processed foods) 9) processed and cured meats 10) nuts and certain seeds 11) chicken livers and other organ meats 12) dairy products like buttermilk, sour cream, and yogurt 13) dried fruits like dates, figs, and raisins 14) garlic 15) onions 16) potato chips 17) pickled foods like olives and sauerkraut 18) some fresh fruits like ripe banana, papaya, red plums, raspberries, kiwi, pineapple 19) tomato-based products  Recommend to keep a migraine diary: rate daily the severity of your headache (1-10) and what foods you eat that day to help determine patterns.    Cervical facet arthrosis: Cervical XR normal- discussed results with patient. Pain is worse on left side.   Anxiety and depression -Her daughter was recently diagnosed with sarcoma and this is a great source of stress and fear to Mrs. [name]. It has been a turbulent time for her and this has understandably worsened her symptoms. Discussed benefits of gratitude journaling and she plans to try this.  -discussed her life stressor, her mom's illness -discussed her difficult wean off Pristiq -start Wellbutrin -discussed following with Dr. Sima Matas.   Insomnia:  -continue amitirptyline to '50mg'$ .  -Try to go outside near sunrise -Get exercise during the day.  -Discussed good sleep hygiene: turning off all devices an hour before bedtime.  -Chamomile tea with dinner.  -Can consider over the counter melatonin -discussed magnesium  Nausea:  -continue  zofran -start B6 -discussed scopolamine patch, discussed to place behind ear and lave on 72 hours.    Cervical myofascial pain syndrome: Will try some trigger point injections with occipital nerve block next visit.   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 Letta Pate for ESI unchanged. Shallow broad-based disc protrusion L4-5 unchanged. -continue Norco -continue turmeric Turmeric to reduce inflammation--can be used in cooking or taken as a supplement.  Benefits of turmeric:  -Highly anti-inflammatory  -Increases antioxidants  -Improves memory, attention, brain disease  -Lowers risk of heart disease  -May help prevent cancer  -Decreases pain  -Alleviates depression  -Delays aging and decreases risk of chronic disease  -Consume with black pepper to increase absorption    Turmeric Milk Recipe:  1 cup milk  1 tsp turmeric  1 tsp cinnamon  1 tsp grated ginger (optional)  Black pepper (boosts the anti-inflammatory properties of turmeric).  1 tsp honey     7 minutes spent in discussion of her migraines that are worse when she sits up and stands from supine position

## 2022-06-08 ENCOUNTER — Encounter: Payer: Self-pay | Admitting: Family Medicine

## 2022-06-08 ENCOUNTER — Other Ambulatory Visit (HOSPITAL_COMMUNITY): Payer: Self-pay

## 2022-06-10 ENCOUNTER — Other Ambulatory Visit (HOSPITAL_COMMUNITY): Payer: Self-pay

## 2022-06-11 ENCOUNTER — Other Ambulatory Visit (HOSPITAL_COMMUNITY): Payer: Self-pay

## 2022-06-12 ENCOUNTER — Other Ambulatory Visit (HOSPITAL_COMMUNITY): Payer: Self-pay

## 2022-06-12 MED ORDER — METFORMIN HCL 1000 MG PO TABS: 1000.0000 mg | ORAL_TABLET | Freq: Two times a day (BID) | ORAL | 1 refills | Status: DC

## 2022-06-13 ENCOUNTER — Other Ambulatory Visit (HOSPITAL_COMMUNITY): Payer: Self-pay

## 2022-06-13 ENCOUNTER — Ambulatory Visit (INDEPENDENT_AMBULATORY_CARE_PROVIDER_SITE_OTHER): Payer: Medicaid Other

## 2022-06-13 VITALS — BP 108/66 | HR 95 | Temp 98.5°F | Resp 16 | Ht 63.0 in | Wt 229.4 lb

## 2022-06-13 DIAGNOSIS — G43711 Chronic migraine without aura, intractable, with status migrainosus: Secondary | ICD-10-CM | POA: Diagnosis not present

## 2022-06-13 MED ORDER — SODIUM CHLORIDE 0.9 % IV SOLN
100.0000 mg | Freq: Once | INTRAVENOUS | Status: AC
  Administered 2022-06-13: 100 mg via INTRAVENOUS
  Filled 2022-06-13: qty 1

## 2022-06-13 NOTE — Progress Notes (Signed)
Diagnosis: Migraines  Provider:  Marshell Garfinkel MD  Procedure: Infusion  IV Type: Peripheral, IV Location: L Forearm  Vyepti (Eptinezumab-jjmr), Dose: 100 mg  Infusion Start Time: 0923  Infusion Stop Time: 0958  Post Infusion IV Care: Peripheral IV Discontinued  Discharge: Condition: Good, Destination: Home . AVS provided to patient.   Performed by:  Koren Shiver, RN

## 2022-06-15 ENCOUNTER — Other Ambulatory Visit (HOSPITAL_COMMUNITY): Payer: Self-pay

## 2022-06-17 ENCOUNTER — Other Ambulatory Visit (HOSPITAL_COMMUNITY): Payer: Self-pay

## 2022-06-18 ENCOUNTER — Other Ambulatory Visit (HOSPITAL_COMMUNITY): Payer: Self-pay

## 2022-06-20 ENCOUNTER — Telehealth: Payer: Self-pay

## 2022-06-20 ENCOUNTER — Other Ambulatory Visit (HOSPITAL_COMMUNITY): Payer: Self-pay

## 2022-06-20 NOTE — Telephone Encounter (Signed)
PA for Vyepti sent to insurance through [facility]

## 2022-06-20 NOTE — Telephone Encounter (Signed)
PA Case: [id number], Status: Approved, Coverage Starts on: 06/20/2022 12:00:00 AM, Coverage Ends on: 06/20/2023 12:00:00 AM.

## 2022-06-26 ENCOUNTER — Telehealth: Payer: Self-pay | Admitting: *Deleted

## 2022-06-26 ENCOUNTER — Encounter: Payer: Self-pay | Admitting: Physical Medicine and Rehabilitation

## 2022-06-26 NOTE — Telephone Encounter (Signed)
Received pharmacy request for refill on the rizatriptan benzoate 10 mg tablets.

## 2022-06-27 ENCOUNTER — Other Ambulatory Visit: Payer: Self-pay | Admitting: Physical Medicine and Rehabilitation

## 2022-06-27 ENCOUNTER — Telehealth: Payer: Self-pay | Admitting: Pharmacy Technician

## 2022-06-27 DIAGNOSIS — G43001 Migraine without aura, not intractable, with status migrainosus: Secondary | ICD-10-CM

## 2022-06-27 MED ORDER — RIZATRIPTAN BENZOATE 10 MG PO TABS: 10.0000 mg | ORAL_TABLET | ORAL | 3 refills | Status: DC | PRN

## 2022-06-27 MED ORDER — ONDANSETRON HCL 4 MG PO TABS: 4.0000 mg | ORAL_TABLET | Freq: Three times a day (TID) | ORAL | 2 refills | Status: DC | PRN

## 2022-06-27 NOTE — Telephone Encounter (Addendum)
Vyepti PA renewal:  Vypeti approval has been back dated due to patient received dose on 06/13/22  New auth date: 06/12/22 - 06/12/23 Vyepti '100mg'$  q3 months. (PHARMACY BENEFIT)  '@[place]'$ : patient used [place] which will be replaced from [facility]. Excell sheet and fyi flag has been updated.  Auth Submission: APPROVED Payer: [place] [employer]ICA[employer] Medication & CPT/J Code(s) submitted: Vyepti (Eptinezumab) [phone] Route of submission (phone, fax, portal):  Phone # Fax # Auth type: Pharmacy Benefit Units/visits requested: '100MG'$  Q3 MONTHS Reference number: [id number] Approval from: 06/12/22 to 06/12/23

## 2022-08-07 DIAGNOSIS — G4733 Obstructive sleep apnea (adult) (pediatric): Secondary | ICD-10-CM | POA: Diagnosis not present

## 2022-08-26 ENCOUNTER — Encounter: Payer: Medicaid Other | Admitting: Pain Medicine

## 2022-08-27 ENCOUNTER — Other Ambulatory Visit (HOSPITAL_COMMUNITY): Payer: Self-pay

## 2022-08-29 ENCOUNTER — Other Ambulatory Visit: Payer: Self-pay | Admitting: Physical Medicine and Rehabilitation

## 2022-08-29 DIAGNOSIS — K580 Irritable bowel syndrome with diarrhea: Secondary | ICD-10-CM

## 2022-08-30 ENCOUNTER — Ambulatory Visit: Payer: Medicaid Other | Admitting: Physical Medicine and Rehabilitation

## 2022-09-05 ENCOUNTER — Ambulatory Visit: Payer: Medicaid Other | Admitting: Family Medicine

## 2022-09-06 ENCOUNTER — Ambulatory Visit: Payer: Medicaid Other | Admitting: Family Medicine

## 2022-09-06 ENCOUNTER — Encounter: Payer: Self-pay | Admitting: Family Medicine

## 2022-09-06 ENCOUNTER — Telehealth: Payer: Self-pay | Admitting: Family Medicine

## 2022-09-06 ENCOUNTER — Ambulatory Visit
Admission: RE | Admit: 2022-09-06 | Discharge: 2022-09-06 | Disposition: A | Discharge status: Home / Self Care | Payer: Medicaid Other | Source: Ambulatory Visit | Attending: Family Medicine | Admitting: Family Medicine

## 2022-09-06 VITALS — BP 110/68 | HR 96 | Temp 98.5°F | Resp 16 | Ht 63.0 in | Wt 233.1 lb

## 2022-09-06 DIAGNOSIS — K625 Hemorrhage of anus and rectum: Secondary | ICD-10-CM | POA: Insufficient documentation

## 2022-09-06 DIAGNOSIS — R350 Frequency of micturition: Secondary | ICD-10-CM

## 2022-09-06 DIAGNOSIS — G4733 Obstructive sleep apnea (adult) (pediatric): Secondary | ICD-10-CM | POA: Diagnosis not present

## 2022-09-06 DIAGNOSIS — R1084 Generalized abdominal pain: Secondary | ICD-10-CM

## 2022-09-06 DIAGNOSIS — R109 Unspecified abdominal pain: Secondary | ICD-10-CM | POA: Diagnosis not present

## 2022-09-06 DIAGNOSIS — R300 Dysuria: Secondary | ICD-10-CM | POA: Diagnosis not present

## 2022-09-06 LAB — POCT URINALYSIS DIPSTICK
Bilirubin, UA: NEGATIVE
Glucose, UA: NEGATIVE
Ketones, UA: NEGATIVE
Nitrite, UA: NEGATIVE
Protein, UA: NEGATIVE
Spec Grav, UA: 1.02 (ref 1.010–1.025)
Urobilinogen, UA: 0.2 E.U./dL
pH, UA: 5 (ref 5.0–8.0)

## 2022-09-06 MED ORDER — DOXYCYCLINE HYCLATE 100 MG PO TABS: 100.0000 mg | ORAL_TABLET | Freq: Two times a day (BID) | ORAL | 0 refills | Status: AC

## 2022-09-06 MED ORDER — PANTOPRAZOLE SODIUM 40 MG PO TBEC: 40.0000 mg | DELAYED_RELEASE_TABLET | Freq: Two times a day (BID) | ORAL | 1 refills | Status: DC

## 2022-09-06 NOTE — Progress Notes (Signed)
Patient ID: Dana Bradley, female    DOB: August 30, 1971, 51 y.o.   MRN: [id number]  PCP: Delsa Grana, PA-C  Chief Complaint  Patient presents with   Urinary Frequency   Dysuria    Onset for several weeks     Subjective:   Dana Bradley is a 51 y.o. 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) 01/02/2022   OSA (obstructive sleep apnea) 12/14/2021   Excessive daytime sleepiness 12/14/2021   Lateral epicondylitis, left elbow 07/19/2021   Abnormal MRI, cervical spine (02/14/2021) 02/28/2021   Cervicalgia 02/13/2021   Abnormal MRI, lumbar spine (12/27/2021) 11/09/2020   Chronic midline thoracic back pain 11/09/2020   Abnormal MRI, thoracic spine (08/02/2019) 11/09/2020   Thoracic spinal stenosis (T7-8) 11/09/2020   Prolapse of thoracic disc with radiculopathy (T7-8) 11/09/2020   Spasm of muscle of lower back 11/09/2020   Vertigo, benign paroxysmal, unspecified laterality 21/30/8657   Uncomplicated opioid dependence ([place]) 11/08/2020   Hyperalgesia 03/07/2020   Neurogenic urinary incontinence 03/01/2020   Other intervertebral disc degeneration, lumbar region 03/01/2020   Spinal stenosis of lumbosacral region 03/01/2020   Pharmacologic therapy 02/06/2020   Lumbar radiculitis (Right) 09/21/2019   Chronic lower extremity pain (Bilateral) 09/06/2019    Intractable migraine with aura without status migrainosus 08/10/2019   Other specified dorsopathies, sacral and sacrococcygeal region 08/03/2019   Latex precautions, history of latex allergy 08/03/2019   History of allergy to radiographic contrast media 08/03/2019   DDD (degenerative disc disease), cervical 07/21/2019   Cervical facet syndrome (Bilateral) (L>R) 07/21/2019   DDD (degenerative disc disease), thoracic 07/21/2019   Osteoarthritis of hip (Left) 07/21/2019   Chronic groin pain (Bilateral) (L>R) 07/21/2019   Chronic hip pain (Bilateral) (L>R) 07/21/2019   Somatic dysfunction of sacroiliac joint (Bilateral) 07/21/2019   Migraine with aura and with status migrainosus, not intractable 04/06/2019   Cervico-occipital neuralgia (Left) 04/06/2019   Weakness of leg (Left) 04/05/2019   Difficulty walking 04/05/2019   Chronic migraine without aura, with intractable migraine, so stated, with status migrainosus 12/27/2018   Malar rash 09/04/2018   Trigger point of neck (Left) 03/19/2018   Occipital headache 12/25/2017   Chronic fatigue syndrome with fibromyalgia 12/12/2017   Trigger point of shoulder region (Left) 11/17/2017   Myofascial pain syndrome (Left) (trapezius muscle) 07/22/2017   Lumbar L1-2 disc protrusion (Right) 04/07/2017   Muscle spasticity 04/01/2017   Osteoarthritis of shoulder (Bilateral) 04/01/2017   Lumbar spondylosis 01/06/2017   Chronic hip pain (Left) 12/24/2016   Chronic sacroiliac joint pain (Left) 12/24/2016   Lumbar facet syndrome (Bilateral) (L>R) 12/24/2016   Lumbar radiculitis (Left) 12/24/2016   Hypertriglyceridemia 11/27/2016   History of vasovagal episode 10/30/2016   Cervicogenic headache 09/09/2016   Medication monitoring encounter 08/29/2016   Controlled substance agreement signed 08/28/2016  Plantar fasciitis of left foot 08/28/2016   Vitamin B12 deficiency 08/28/2016   Hyperlipidemia 08/28/2016   Nephrolithiasis 08/12/2016   Chronic pain  syndrome 08/07/2016   Long term prescription opiate use 08/07/2016   Opiate use 08/07/2016   Long term prescription benzodiazepine use 08/07/2016   Neurogenic pain 08/07/2016   Chronic low back pain (1ry area of Pain) (Bilateral) (R>L) (midline) 08/07/2016   Chronic upper back pain (2ry area of Pain) (Bilateral) (L>R) 08/07/2016   Chronic abdominal pain (Right lower quadrant) 08/07/2016   Thoracic radiculitis (Bilateral: T10, T11) 08/07/2016   Chronic occipital neuralgia (3ry area of Pain) (Bilateral) (L>R) 08/07/2016   Chronic neck pain 08/07/2016   Chronic cervical radicular pain (Bilateral) (L>R) 08/07/2016   Chronic shoulder blade pain (Bilateral) (L>R) 08/07/2016   Chronic upper extremity pain (Bilateral) (R>L) 08/07/2016   Chronic knee pain (Bilateral) (R>L) 08/07/2016   Chronic ankle pain (Bilateral) 08/07/2016   Cervical spondylosis with myelopathy and radiculopathy 08/07/2016   Panic disorder with agoraphobia 05/29/2016   Depression, unspecified depression type 05/29/2016   Atypical lymphocytosis 05/01/2016   Vitamin D insufficiency 05/01/2016   Chronic lower extremity cramps (Bilateral) (R>L) 04/29/2016   Class 3 severe obesity with serious comorbidity and body mass index (BMI) of 45.0 to 49.9 in adult ([place]) 04/29/2016   GAD (generalized anxiety disorder) 04/29/2016   Fatigue 04/29/2016   Insomnia 07/12/2015   Migraine without aura and with status migrainosus, not intractable 07/12/2015   Chronic superficial gastritis 06/02/2015   Chronic pain of multiple joints 05/15/2015   Bilateral leg edema 05/02/2015   Paroxysmal supraventricular tachycardia ([place]) 04/17/2015   Exertional shortness of breath 04/17/2015   Bright red rectal bleeding 04/06/2015   DDD (degenerative disc disease), lumbosacral 01/24/2014   DDD (degenerative disc disease), lumbar 01/24/2014   Cervico-occipital neuralgia 12/29/2013   Fibromyalgia 12/29/2013   Migraine headache 12/29/2013   Menorrhagia  12/10/2012   Depression, major, recurrent, in remission ([place]) 01/12/2009   Chest pain 01/12/2009   Hypertension, benign essential, goal below 140/90 06/23/2008   History of PSVT (paroxysmal supraventricular tachycardia) 06/17/2008   Obstructive sleep apnea 06/17/2008   GERD 06/13/2008      Current Outpatient Medications:    amitriptyline (ELAVIL) 75 MG tablet, Take 1 tablet (75 mg total) by mouth at bedtime., Disp: 90 tablet, Rfl: 3   atorvastatin (LIPITOR) 40 MG tablet, TAKE 1 TABLET (40 MG TOTAL) BY MOUTH AT BEDTIME., Disp: 90 tablet, Rfl: 3   buPROPion ER (WELLBUTRIN SR) 100 MG 12 hr tablet, Take 1 tablet (100 mg total) by mouth 2 (two) times daily., Disp: 60 tablet, Rfl: 2   butalbital-acetaminophen-caffeine (BAC) 50-325-40 MG tablet, Take 1 tablet by mouth every 4 (four) hours as needed for migraine., Disp: 60 tablet, Rfl: 3   cyanocobalamin (,VITAMIN B-12,) 1000 MCG/ML injection, INJECT 1 ML (1,000 MCG) INTO THE MUSCLE ONCE FOR 1 DOSE, Disp: 1 mL, Rfl: 3   Eptinezumab-jjmr (VYEPTI) 100 MG/ML injection, Inject 1 mL (100 mg total) into the vein every 3 (three) months., Disp: 1 mL, Rfl: 3   lipase/protease/amylase (CREON) 12000-38000 units CPEP capsule, Take 2 capsules by mouth with the first bite of each meal and take 1 capsule before snacks. (Max of 8 capsules per day)., Disp: 270 capsule, Rfl: 1   meclizine (ANTIVERT) 12.5 MG tablet, Take 1 tablet (12.5 mg total) by mouth 3 (three) times daily as needed for dizziness., Disp: 30 tablet, Rfl: 0   medroxyPROGESTERone (PROVERA) 5 MG tablet, TAKE 1 TABLET BY MOUTH  DAILY, Disp: 90 tablet, Rfl: 1   metFORMIN (GLUCOPHAGE) 1000 MG tablet, Take 1 tablet (1,000 mg total) by mouth 2 (two) times daily with a meal., Disp: 180 tablet, Rfl: 1   metoprolol tartrate (LOPRESSOR) 50 MG tablet, TAKE 1 AND 1/2 TABLET BY MOUTH 2 TIMES DAILY, Disp: 135 tablet, Rfl: 3   Needles & Syringes MISC, For administration of B12 injections, Disp: 30 each, Rfl: 0    omeprazole (PRILOSEC) 40 MG capsule, Take 1 capsule (40 mg total) by mouth daily., Disp: 90 capsule, Rfl: 3   ondansetron (ZOFRAN) 4 MG tablet, Take 1 tablet (4 mg total) by mouth every 8 (eight) hours as needed., Disp: 20 tablet, Rfl: 2   rizatriptan (MAXALT) 10 MG tablet, Take 1 tablet (10 mg total) by mouth as needed for migraine. May repeat in 2 hours if needed, Disp: 10 tablet, Rfl: 3   scopolamine (TRANSDERM-SCOP) 1 MG/3DAYS, Place 1 patch (1.5 mg total) onto the skin every 3 (three) days., Disp: 10 patch, Rfl: 12   VALERIAN PO, Take by mouth as needed. Makes Valerian tea about 3-4 times per week., Disp: , Rfl:    baclofen (LIORESAL) 10 MG tablet, Take 1 tablet (10 mg total) by mouth 2 (two) times daily. Each refill must last 30 days., Disp: 60 tablet, Rfl: 2   diclofenac Sodium (VOLTAREN) 1 % GEL, Apply 2 g topically 4 (four) times daily as needed., Disp: 350 g, Rfl: PRN   dicyclomine (BENTYL) 10 MG capsule, Take 1 capsule (10 mg total) by mouth 4 (four) times daily as needed (before meals and at bedtime), Disp: 360 capsule, Rfl: 1   HYDROcodone-acetaminophen (NORCO) 7.5-325 MG tablet, Take 1 tablet by mouth 2 (two) times daily as needed for severe pain. Must last 30 days, Disp: 45 tablet, Rfl: 0   HYDROcodone-acetaminophen (NORCO) 7.5-325 MG tablet, Take 1 tablet by mouth 2 (two) times daily as needed for severe pain. Must last 30 days, Disp: 45 tablet, Rfl: 0   HYDROcodone-acetaminophen (NORCO) 7.5-325 MG tablet, Take 1 tablet by mouth 2 (two) times daily as needed for severe pain. Must last 30 days, Disp: 45 tablet, Rfl: 0   pregabalin (LYRICA) 150 MG capsule, Take 1 capsule (150 mg total) by mouth every 8 (eight) hours., Disp: 270 capsule, Rfl: 3   Allergies  Allergen Reactions   Aspirin Swelling   Cephalexin Rash   Cymbalta [Duloxetine Hcl] Other (See Comments)    Suicidal ideations and has homicidal thoughts per patient   Depakote [Divalproex Sodium] Shortness Of Breath    W/ n/v    Gadolinium Derivatives     Pt was unable to breath Other reaction(s): Other (See Comments) Pt was unable to breath   Haloperidol Shortness Of Breath    W/ n/v   Iodinated Contrast Media Other (See Comments) and Shortness Of Breath    Other reaction(s): Other (See Comments)   Meperidine Nausea And Vomiting    Patient projectile vomits and usually result in ER Other reaction(s): Vomiting   Metoclopramide Shortness Of Breath    Can't breath, wheezes Other reaction(s): Other (See Comments)   Morphine     Other reaction(s): Vomiting   Penicillins Rash    Other reaction(s): Unknown   Prochlorperazine Other (See Comments)    Panic attack Other reaction(s): Other (See Comments)   Tramadol Hcl Palpitations    Severely and adversely affects her SVT giving her tachycardias of 150-160 bpm.   Trazodone Shortness Of Breath    Other reaction(s): Other (  See Comments)   Meloxicam Other (See Comments)    mouth sores, tingling, blisters in mouth Other reaction(s): Other (See Comments) mout sores, tingling mouth sores, tingling, blisters in mouth   Neomycin-Bacitracin Zn-Polymyx Rash   Tomato Hives    Tongue will blister   Other     Other reaction(s): Other (See Comments)   Shellfish Allergy     Other reaction(s): Unknown   Shellfish-Derived Products Other (See Comments)    Other reaction(s): Unknown Other reaction(s): Unknown   Bacitracin-Neomycin-Polymyxin Rash   Cephalosporins Rash    rash   Ibuprofen Other (See Comments) and Rash    Blisters in mouth. Blisters in mouth.   Latex Itching    Other reaction(s): Unknown   Nsaids Other (See Comments)    Blisters in mouth; can take 1 ibuprofen 2x a month Other reaction(s): Other (See Comments), Unknown Blisters in mouth; can take 1 ibuprofen 2x a month   Sulfa Antibiotics Rash    rash Other reaction(s): Unknown rash  Other reaction(s): Unknown rash rash Other reaction(s): Unknown rash   Sulfonamide Derivatives Rash      Social History   Tobacco Use   Smoking status: Former    Packs/day: 2.00    Years: 3.00    Total pack years: 6.00    Types: Cigarettes    Quit date: 12/10/1992    Years since quitting: 29.7   Smokeless tobacco: Never   Tobacco comments:    quit 25 years ago  Vaping Use   Vaping Use: Never used  Substance Use Topics   Alcohol use: No    Comment: socially   Drug use: No      Chart Review Today: I personally reviewed active problem list, medication list, allergies, family history, social history, health maintenance, notes from last encounter, lab results, imaging with the patient/caregiver today.   Review of Systems  Constitutional: Negative.   HENT: Negative.    Eyes: Negative.   Respiratory: Negative.    Cardiovascular: Negative.   Gastrointestinal: Negative.   Endocrine: Negative.   Genitourinary: Negative.   Musculoskeletal: Negative.   Skin: Negative.   Allergic/Immunologic: Negative.   Neurological: Negative.   Hematological: Negative.   Psychiatric/Behavioral: Negative.    All other systems reviewed and are negative.      Objective:   Vitals:   09/06/22 0859 09/06/22 0911  BP: 110/68   Pulse: (!) 110 96  Resp: 16   Temp: 98.5 F (36.9 C)   TempSrc: Oral   SpO2: 98%   Weight: 233 lb 1.6 oz (105.7 kg)   Height: _0  (1.6 m)     Body mass index is 41.29 kg/m.  Physical Exam Vitals and nursing note reviewed.  Constitutional:      General: She is not in acute distress.    Appearance: Normal appearance. She is well-developed. She is obese. She is not ill-appearing, toxic-appearing or diaphoretic.     Interventions: Face mask in place.  HENT:     Head: Normocephalic and atraumatic.     Right Ear: External ear normal.     Left Ear: External ear normal.     Nose: Mucosal edema (erythema) and congestion present.     Right Nostril: No epistaxis.     Left Nostril: No epistaxis.     Right Turbinates: Swollen.     Left Turbinates: Swollen.      Right Sinus: Maxillary sinus tenderness and frontal sinus tenderness present.     Left Sinus: Maxillary sinus tenderness and  frontal sinus tenderness present.  Eyes:     General: Lids are normal. No scleral icterus.       Right eye: No discharge.        Left eye: No discharge.     Conjunctiva/sclera: Conjunctivae normal.  Neck:     Trachea: Phonation normal. No tracheal deviation.  Cardiovascular:     Rate and Rhythm: Normal rate and regular rhythm.     Pulses: Normal pulses.          Radial pulses are 2+ on the right side and 2+ on the left side.       Posterior tibial pulses are 2+ on the right side and 2+ on the left side.     Heart sounds: Normal heart sounds. No murmur heard.    No friction rub. No gallop.  Pulmonary:     Effort: Pulmonary effort is normal. No respiratory distress.     Breath sounds: Normal breath sounds. No stridor. No wheezing, rhonchi or rales.  Chest:     Chest wall: No tenderness.  Abdominal:     General: Abdomen is protuberant. Bowel sounds are decreased. There is distension.     Palpations: Abdomen is soft.     Tenderness: There is abdominal tenderness (very tender to even light palpation of abd).  Musculoskeletal:     Right lower leg: No edema.     Left lower leg: No edema.  Skin:    General: Skin is warm and dry.     Coloration: Skin is not jaundiced or pale.     Findings: No rash.  Neurological:     Mental Status: She is alert.     Motor: No abnormal muscle tone.     Gait: Gait normal.  Psychiatric:        Mood and Affect: Mood normal.        Speech: Speech normal.        Behavior: Behavior normal. Behavior is cooperative.      Results for orders placed or performed in visit on 09/06/22  POCT urinalysis dipstick  Result Value Ref Range   Color, UA Yellow    Clarity, UA Cloudy    Glucose, UA Negative Negative   Bilirubin, UA Negative    Ketones, UA Negative    Spec Grav, UA 1.020 1.010 - 1.025   Blood, UA Trace    pH, UA 5.0 5.0 -  8.0   Protein, UA Negative Negative   Urobilinogen, UA 0.2 0.2 or 1.0 E.U./dL   Nitrite, UA Negative    Leukocytes, UA Small (1+) (A) Negative   Appearance Yellow    Odor Foul        Assessment & Plan:   1. Generalized abdominal pain History of chronic abdominal pain, IBS, internal hemorrhoids, previously managed by [facility], Dr. Vicente Males she has been given Creon to take with eating to see if that improves her symptoms, she notices no difference, she is on omeprazole, she endorses various different symptoms including epigastric burning and indigestion sometimes it is severe, she has severe abdominal cramping and bloating across her upper abdomen that usually precedes episodes of diarrhea, for years she has had the symptoms however recently the abdominal cramping/pain and episodes of diarrhea have gross blood in the toilet and sometimes spraying all over the toilet, she has leaking of blood and is wearing pads and depends to be able to prevent from bleeding everywhere, she has no rectal pain when she has a bowel movement, sometimes blood  is bright red and other times it is darker clotted maroon and jelly.  The frequency of these bloody episodes has increased she reports having blood for days after abdominal pain She also has lower suprapubic abdominal pain and pressure it feels like her bladder is distended and she is having urinary frequency and urgency - Urine Culture - CBC with Differential/Platelet - COMPLETE METABOLIC PANEL WITH GFR - Lipase - Ambulatory referral to Gastroenterology - CT Abdomen Pelvis W Contrast  2. Dysuria UA is not overly concerning we will wait for culture - POCT urinalysis dipstick - Urine Culture  3. Urinary frequency See plan above - POCT urinalysis dipstick - Urine Culture  4. Bright red blood per rectum She is concerned because of increased frequency but she does not feel like she is getting tired, cold easily, short of breath or weak she suspects that it  may be an internal hemorrhoid but she is also having a lot more abdominal pain, she has had a colonoscopy and met with GI in the past but she has not seen them for a few years. - CBC with Differential/Platelet - Ambulatory referral to Gastroenterology - CT Abdomen Pelvis W Contrast   Tenderness on exam and very concerning reported symptoms today, rechecking blood work to ensure that she is not a drop in her hemoglobin, and stat CT   Delsa Grana, PA-C 09/06/22 9:23 AM

## 2022-09-06 NOTE — Telephone Encounter (Signed)
Pt said she could not wait she had to go and check on her husband b ut just let her know when she needs to go and when you have heard from them about the appt.

## 2022-09-06 NOTE — Telephone Encounter (Signed)
Unable to reach out to patient spoke to her husband. Told husband to let pt know to head now to [facility] imaging to do her CT and remain NPO.

## 2022-09-07 LAB — CBC WITH DIFFERENTIAL/PLATELET
Absolute Monocytes: 605 cells/uL (ref 200–950)
Basophils Absolute: 71 cells/uL (ref 0–200)
Basophils Relative: 0.8 %
Eosinophils Absolute: 267 cells/uL (ref 15–500)
Eosinophils Relative: 3 %
HCT: 37.4 % (ref 35.0–45.0)
Hemoglobin: 12.7 g/dL (ref 11.7–15.5)
Lymphs Abs: 1967 cells/uL (ref 850–3900)
MCH: 32.5 pg (ref 27.0–33.0)
MCHC: 34 g/dL (ref 32.0–36.0)
MCV: 95.7 fL (ref 80.0–100.0)
MPV: 9.8 fL (ref 7.5–12.5)
Monocytes Relative: 6.8 %
Neutro Abs: 5990 cells/uL (ref 1500–7800)
Neutrophils Relative %: 67.3 %
Platelets: 264 10*3/uL (ref 140–400)
RBC: 3.91 10*6/uL (ref 3.80–5.10)
RDW: 13 % (ref 11.0–15.0)
Total Lymphocyte: 22.1 %
WBC: 8.9 10*3/uL (ref 3.8–10.8)

## 2022-09-07 LAB — COMPLETE METABOLIC PANEL WITH GFR
AG Ratio: 1.7 (calc) (ref 1.0–2.5)
ALT: 35 U/L — ABNORMAL HIGH (ref 6–29)
AST: 22 U/L (ref 10–35)
Albumin: 4.5 g/dL (ref 3.6–5.1)
Alkaline phosphatase (APISO): 112 U/L (ref 37–153)
BUN: 14 mg/dL (ref 7–25)
CO2: 27 mmol/L (ref 20–32)
Calcium: 9.9 mg/dL (ref 8.6–10.4)
Chloride: 105 mmol/L (ref 98–110)
Creat: 0.81 mg/dL (ref 0.50–1.03)
Globulin: 2.6 g/dL (calc) (ref 1.9–3.7)
Glucose, Bld: 163 mg/dL — ABNORMAL HIGH (ref 65–99)
Potassium: 4.8 mmol/L (ref 3.5–5.3)
Sodium: 143 mmol/L (ref 135–146)
Total Bilirubin: 0.3 mg/dL (ref 0.2–1.2)
Total Protein: 7.1 g/dL (ref 6.1–8.1)
eGFR: 88 mL/min/{1.73_m2} (ref >=60)

## 2022-09-07 LAB — URINE CULTURE
MICRO NUMBER:: 14290956
SPECIMEN QUALITY:: ADEQUATE

## 2022-09-07 LAB — LIPASE: Lipase: 37 U/L (ref 7–60)

## 2022-09-09 ENCOUNTER — Other Ambulatory Visit (HOSPITAL_COMMUNITY): Payer: Self-pay

## 2022-09-09 ENCOUNTER — Other Ambulatory Visit: Payer: Self-pay | Admitting: Physical Medicine and Rehabilitation

## 2022-09-09 NOTE — Progress Notes (Signed)
Your welcome and thanks for directly contacting me for anything that seems more urgent . Its the best way for things to not get missed

## 2022-09-09 NOTE — Progress Notes (Signed)
Herb Grays can we bring her in this week please ok to overbook on Thursday if other days are filled

## 2022-09-11 ENCOUNTER — Telehealth: Payer: Self-pay

## 2022-09-11 NOTE — Telephone Encounter (Signed)
-----   Message from Jonathon Bellows, MD sent at 09/09/2022  8:11 AM EST ----- Herb Grays can we bring her in this week please ok to overbook on Thursday if other days are filled

## 2022-09-11 NOTE — Telephone Encounter (Signed)
Called patient to offer her an appointment with Dr. Vicente Males and she agreed to come in tomorrow 09/12/2022 at 11:30 AM.

## 2022-09-12 ENCOUNTER — Other Ambulatory Visit: Payer: Self-pay

## 2022-09-12 ENCOUNTER — Encounter: Payer: Self-pay | Admitting: Physical Medicine and Rehabilitation

## 2022-09-12 ENCOUNTER — Encounter: Payer: Self-pay | Admitting: Gastroenterology

## 2022-09-12 ENCOUNTER — Ambulatory Visit: Payer: Medicaid Other | Admitting: Gastroenterology

## 2022-09-12 VITALS — BP 122/82 | HR 112 | Ht 62.0 in | Wt 232.8 lb

## 2022-09-12 DIAGNOSIS — K5909 Other constipation: Secondary | ICD-10-CM | POA: Diagnosis not present

## 2022-09-12 DIAGNOSIS — K625 Hemorrhage of anus and rectum: Secondary | ICD-10-CM

## 2022-09-12 DIAGNOSIS — R935 Abnormal findings on diagnostic imaging of other abdominal regions, including retroperitoneum: Secondary | ICD-10-CM

## 2022-09-12 MED ORDER — NA SULFATE-K SULFATE-MG SULF 17.5-3.13-1.6 GM/177ML PO SOLN: 354.0000 mL | Freq: Once | ORAL | 0 refills | Status: AC

## 2022-09-12 NOTE — Progress Notes (Signed)
Jonathon Bellows MD, MRCP(U.K) [address]  [place]  [place], [place] [address]  Main: [phone]  Fax: [phone]   Primary Care Physician: Delsa Grana, PA-C  Primary Gastroenterologist:  Dr. Jonathon Bellows   Chief Complaint  Patient presents with   Results    HPI: Dana Bradley is a 51 y.o. female  Summary of history : Initially referred and seen on 11/10/2019 for abdominal pain and diarrhea. She was previously a patient of [facility] gastroenterology.   Known to have internal hemorrhoids per her last colonoscopy in 2016,  acid reflux with esophagitis in 2016 per her endoscopy.    She has a longstanding history of irritable bowel syndrome with diarrhea.    She consumes some sweet and low in her drinks daily. up to 2 or 3 bowel movements which are very runny sticky and smelly each day.  Sometimes appear to be pale-colored.  On  Dexilant for her acid reflux. She has generalized abdominal pain on and off crampy in nature associated with watery diarrhea and relieved after bowel movement.  Stress  make it worse.   10/18/2019 CT scan of the abdomen and pelvis with contrast demonstrates mild hepatic steatosis but no acute intra-abdominal pathology.     12/07/2019: H. pylori breath test, celiac serology negative.  Stool for C. difficile as well as fecal calprotectin negative. 03/20/2020 colonoscopy: No polyps seen no colitis noted endoscopically biopsies of the terminal ileum as well as random colon biopsies were completely normal.    Interval history 01/17/2021-09/12/2022   09/06/2022: seen by Delsa Grana, PA-C for abdominal pain Also had BRBPR.    09/06/2022: Hb 12.7 grams , CMP-normal.   09/06/2022:  Ct abdomen and pelvis : F ocal abnormal appearing segment of transverse colon. It is unclear if this represents an area of irregular wall thickening or potentially the appearance is secondary to dense enteric contents. Additionally, there is a focal area of narrowing involving the distal  transverse colon. Recommend further evaluation with colonoscopy to exclude the possibility of colonic malignancy  2. Dense enteric contents within the distal colon, potentially ingested contents. Possibility of blood products not excluded   Been on treatment for EPI, GERD and IBS.  She says that since 2019 she has had some blood associated with a bowel movement that was painless but recently has increased significantly she showed me pictures on her phone which showed bright red blood large in quantity spread all over her toilet seat.  This is causing her social anxiety to go out of the house.  In addition she has nonspecific abdominal pain in the lower part of the abdomen which she sees occurs right after she eats sharp in nature, cramping at times.  Not relieved with a bowel movement.  She does not have a bowel movement daily.  Can go up 2 days without a bowel movement.  Feels bloated and has abdominal distention all the time.  No weight loss.      Current Outpatient Medications  Medication Sig Dispense Refill   amitriptyline (ELAVIL) 75 MG tablet Take 1 tablet (75 mg total) by mouth at bedtime. 90 tablet 3   atorvastatin (LIPITOR) 40 MG tablet TAKE 1 TABLET (40 MG TOTAL) BY MOUTH AT BEDTIME. 90 tablet 3   baclofen (LIORESAL) 10 MG tablet Take 1 tablet (10 mg total) by mouth 2 (two) times daily. Each refill must last 30 days. 60 tablet 2   buPROPion ER (WELLBUTRIN SR) 100 MG 12 hr tablet TAKE 1  TABLET BY MOUTH 2 TIMES DAILY 60 tablet 2   butalbital-acetaminophen-caffeine (BAC) 50-325-40 MG tablet Take 1 tablet by mouth every 4 (four) hours as needed for migraine. 60 tablet 3   cyanocobalamin (,VITAMIN B-12,) 1000 MCG/ML injection INJECT 1 ML (1,000 MCG) INTO THE MUSCLE ONCE FOR 1 DOSE 1 mL 3   doxycycline (VIBRA-TABS) 100 MG tablet Take 1 tablet (100 mg total) by mouth 2 (two) times daily for 7 days. 14 tablet 0   Eptinezumab-jjmr (VYEPTI) 100 MG/ML injection Inject 1 mL (100 mg total) into the  vein every 3 (three) months. 1 mL 3   lipase/protease/amylase (CREON) 12000-38000 units CPEP capsule Take 2 capsules by mouth with the first bite of each meal and take 1 capsule before snacks. (Max of 8 capsules per day). 270 capsule 1   meclizine (ANTIVERT) 12.5 MG tablet Take 1 tablet (12.5 mg total) by mouth 3 (three) times daily as needed for dizziness. 30 tablet 0   medroxyPROGESTERone (PROVERA) 5 MG tablet TAKE 1 TABLET BY MOUTH DAILY 90 tablet 1   metFORMIN (GLUCOPHAGE) 1000 MG tablet Take 1 tablet (1,000 mg total) by mouth 2 (two) times daily with a meal. 180 tablet 1   metoprolol tartrate (LOPRESSOR) 50 MG tablet TAKE 1 AND 1/2 TABLET BY MOUTH 2 TIMES DAILY 135 tablet 3   Needles & Syringes MISC For administration of B12 injections 30 each 0   ondansetron (ZOFRAN) 4 MG tablet Take 1 tablet (4 mg total) by mouth every 8 (eight) hours as needed. 20 tablet 2   pantoprazole (PROTONIX) 40 MG tablet Take 1 tablet (40 mg total) by mouth 2 (two) times daily. 60 tablet 1   rizatriptan (MAXALT) 10 MG tablet Take 1 tablet (10 mg total) by mouth as needed for migraine. May repeat in 2 hours if needed 10 tablet 3   scopolamine (TRANSDERM-SCOP) 1 MG/3DAYS Place 1 patch (1.5 mg total) onto the skin every 3 (three) days. 10 patch 12   VALERIAN PO Take by mouth as needed. Makes Valerian tea about 3-4 times per week.     diclofenac Sodium (VOLTAREN) 1 % GEL Apply 2 g topically 4 (four) times daily as needed. 350 g PRN   dicyclomine (BENTYL) 10 MG capsule Take 1 capsule (10 mg total) by mouth 4 (four) times daily as needed (before meals and at bedtime) 360 capsule 1   HYDROcodone-acetaminophen (NORCO) 7.5-325 MG tablet Take 1 tablet by mouth 2 (two) times daily as needed for severe pain. Must last 30 days 45 tablet 0   HYDROcodone-acetaminophen (NORCO) 7.5-325 MG tablet Take 1 tablet by mouth 2 (two) times daily as needed for severe pain. Must last 30 days 45 tablet 0   HYDROcodone-acetaminophen (NORCO)  7.5-325 MG tablet Take 1 tablet by mouth 2 (two) times daily as needed for severe pain. Must last 30 days 45 tablet 0   Na Sulfate-K Sulfate-Mg Sulf 17.5-3.13-1.6 GM/177ML SOLN Take 354 mLs by mouth once for 1 dose. Starting at 5 PM take one bottle and pour into the supplied cup, add cool water to the fill 16 oz line and drink all. Then 5 hours before procedure pour the second bottle into the supplied cup, add cool water to the fill 16 oz line and drink all. 354 mL 0   pregabalin (LYRICA) 150 MG capsule Take 1 capsule (150 mg total) by mouth every 8 (eight) hours. 270 capsule 3   No current facility-administered medications for this visit.    Allergies as of  09/12/2022 - Review Complete 09/12/2022  Allergen Reaction Noted   Aspirin Swelling 06/13/2008   Cephalexin Rash 12/10/2012   Cymbalta [duloxetine hcl] Other (See Comments) 09/09/2016   Depakote [divalproex sodium] Shortness Of Breath 12/10/2012   Gadolinium derivatives  07/20/2019   Haloperidol Shortness Of Breath 05/15/2015   Meperidine Nausea And Vomiting 12/10/2012   Metoclopramide Shortness Of Breath 09/16/2016   Morphine  02/20/2022   Penicillins Rash 06/13/2008   Prochlorperazine Other (See Comments) 09/16/2016   Tramadol hcl Palpitations 09/09/2016   Trazodone Shortness Of Breath 01/23/2015   Meloxicam Other (See Comments) 03/17/2014   Neomycin-bacitracin zn-polymyx Rash 12/10/2012   Tomato Hives 04/29/2016   Other  03/17/2014   Shellfish allergy  03/17/2014   Shellfish-derived products Other (See Comments) 03/17/2014   Bacitra-neomycin-polymyxin-hc Rash 09/12/2022   Bacitracin-neomycin-polymyxin Rash 05/15/2015   Cephalosporins Rash 05/15/2015   Ibuprofen Other (See Comments) and Rash 01/05/2013   Latex Itching 03/17/2014   Nsaids Other (See Comments) 03/17/2014   Sulfa antibiotics Rash 03/17/2014   Sulfonamide derivatives Rash 06/13/2008    ROS:  General: Negative for anorexia, weight loss, fever, chills,  fatigue, weakness. ENT: Negative for hoarseness, difficulty swallowing , nasal congestion. CV: Negative for chest pain, angina, palpitations, dyspnea on exertion, peripheral edema.  Respiratory: Negative for dyspnea at rest, dyspnea on exertion, cough, sputum, wheezing.  GI: See history of present illness. GU:  Negative for dysuria, hematuria, urinary incontinence, urinary frequency, nocturnal urination.  Endo: Negative for unusual weight change.    Physical Examination:   BP 122/82   Pulse (!) 112   Ht '5\' 2"'$  (1.575 m)   Wt 232 lb 12.8 oz (105.6 kg)   BMI 42.58 kg/m   General: Well-nourished, well-developed in no acute distress.  Eyes: No icterus. Conjunctivae pink. Abdomen: Bowel sounds are normal, nontender, nondistended, no hepatosplenomegaly or masses, no abdominal bruits or hernia , no rebound or guarding.   Extremities: No lower extremity edema. No clubbing or deformities. Neuro: Alert and oriented x 3.  Grossly intact. Skin: Warm and dry, no jaundice.   Psych: Alert and cooperative, normal mood and affect.   Imaging Studies: CT ABDOMEN PELVIS WO CONTRAST  Result Date: 09/06/2022 CLINICAL DATA:  Abdominal pain. EXAM: CT ABDOMEN AND PELVIS WITHOUT CONTRAST TECHNIQUE: Multidetector CT imaging of the abdomen and pelvis was performed following the standard protocol without IV contrast. RADIATION DOSE REDUCTION: This exam was performed according to the departmental dose-optimization program which includes automated exposure control, adjustment of the mA and/or kV according to patient size and/or use of iterative reconstruction technique. COMPARISON:  CT abdomen pelvis 10/18/2019 FINDINGS: Lower chest: Normal heart size. Dependent atelectasis within the bilateral lower lobes. No pleural effusion. Hepatobiliary: Liver is diffusely low in attenuation compatible with steatosis. Gallbladder is unremarkable. Pancreas: Unremarkable. No pancreatic ductal dilatation or surrounding inflammatory  changes. Spleen: Normal in size without focal abnormality. Adrenals/Urinary Tract: Normal adrenal glands. Kidneys are symmetric in size. 10 mm stone inferior pole right kidney. No hydronephrosis. Urinary bladder is unremarkable. Stomach/Bowel: There is a focal abnormal appearing segment of transverse colon (image 48; series 2). It is unclear if this represents an area with irregular wall thickening or potentially the appearance is secondary to dense enteric contents. Additionally, there is a focal area of narrowing involving the distal transverse colon (image 62; series 2), nonspecific. Normal morphology of the stomach. Small bowel is unremarkable. No free fluid or free intraperitoneal air. Vascular/Lymphatic: Normal caliber abdominal aorta. No retroperitoneal lymphadenopathy. Reproductive: Uterus and adnexal structures are  unremarkable. Other: None Musculoskeletal: No aggressive or acute appearing osseous lesions. IMPRESSION: 1. There is a focal abnormal appearing segment of transverse colon. It is unclear if this represents an area of irregular wall thickening or potentially the appearance is secondary to dense enteric contents. Additionally, there is a focal area of narrowing involving the distal transverse colon. Recommend further evaluation with colonoscopy to exclude the possibility of colonic malignancy. 2. Dense enteric contents within the distal colon, potentially ingested contents. Possibility of blood products not excluded. 3. Hepatic steatosis. 4. Right-sided nephrolithiasis. 5. These results will be called to the ordering clinician or representative by the Radiologist Assistant, and communication documented in the PACS or [employer]. Electronically Signed   By: Lovey Newcomer M.D.   On: 09/06/2022 11:55    Assessment and Plan:   Dana Bradley is a 51 y.o. y/o female the history of EPI GERD and IBS.  Urgent visit to see me today to discuss results of recent CT scan of the abdomen which shows  accumulation of blood like material within the colon as well as narrowing in the transverse colon.  Last colonoscopy was in 2021 which appeared to be normal.  She has bright red blood per rectum going on which has been painless, worsening of constipation but she is on narcotics which could be the cause.  She has abdominal pain associated with food intake.  Plan 1.  Urgent diagnostic colonoscopy and upper endoscopy early next week 2.  Discussed about conservative management of internal hemorrhoids, if colonoscopy shows hemorrhoids we will start banding them at the office next week. 3.  Avoid NSAIDs which she has stayed away from, H. pylori breath test 4.  If pain does not resolve despite treating her constipation with 1 capful of MiraLAX daily which she will take every single day, will try PPI at next visit and obtain biliary imaging   I have discussed alternative options, risks & benefits,  which include, but are not limited to, bleeding, infection, perforation,respiratory complication & drug reaction.  The patient agrees with this plan & written consent will be obtained.      Dr Jonathon Bellows  MD,MRCP [facility]) Follow up in 1-2 weeks

## 2022-09-13 ENCOUNTER — Ambulatory Visit (INDEPENDENT_AMBULATORY_CARE_PROVIDER_SITE_OTHER): Payer: Medicaid Other

## 2022-09-13 VITALS — BP 112/73 | HR 99 | Temp 98.2°F | Resp 18 | Ht 63.0 in | Wt 232.6 lb

## 2022-09-13 DIAGNOSIS — G43711 Chronic migraine without aura, intractable, with status migrainosus: Secondary | ICD-10-CM | POA: Diagnosis not present

## 2022-09-13 MED ORDER — SODIUM CHLORIDE 0.9 % IV SOLN
100.0000 mg | Freq: Once | INTRAVENOUS | Status: AC
  Administered 2022-09-13: 100 mg via INTRAVENOUS
  Filled 2022-09-13: qty 1

## 2022-09-13 NOTE — Progress Notes (Signed)
Diagnosis: Migraines  Provider:  Marshell Garfinkel MD  Procedure: Infusion  IV Type: Peripheral, IV Location: R Forearm  Vyepti (Eptinezumab-jjmr), Dose: 100 mg  Infusion Start Time: 1410  Infusion Stop Time: 1004  Post Infusion IV Care: Peripheral IV Discontinued  Discharge: Condition: Good, Destination: Home . AVS provided to patient.   Performed by:  Cleophus Molt, RN

## 2022-09-15 NOTE — Progress Notes (Unsigned)
PROVIDER NOTE: Information contained herein reflects review and annotations entered in association with encounter. Interpretation of such information and data should be left to medically-trained personnel. Information provided to patient can be located elsewhere in the medical record under "Patient Instructions". Document created using STT-dictation technology, any transcriptional errors that may result from process are unintentional.    Patient: Dana Bradley  Service Category: E/M  Provider: Gaspar Cola, MD  DOB: 1971-04-10  DOS: 09/18/2022  Referring Provider: Laurell Roof  MRN: [id number]  Specialty: Interventional Pain Management  PCP: Delsa Grana, PA-C  Type: Established Patient  Setting: Ambulatory outpatient    Location: Office  Delivery: Face-to-face     HPI  Dana Bradley, a 51 y.o. year old female, is here today because of her No primary diagnosis found.. Dana Bradley's primary complain today is No chief complaint on file. Last encounter: My last encounter with her was on 05/27/2022. Pertinent problems: Dana Bradley has Cervico-occipital neuralgia; DDD (degenerative disc disease), lumbosacral; Chronic pain of multiple joints; DDD (degenerative disc disease), lumbar; Fibromyalgia; Migraine without aura and with status migrainosus, not intractable; Chronic lower extremity cramps (Bilateral) (R>L); Chronic pain syndrome; Neurogenic pain; Chronic low back pain (1ry area of Pain) (Bilateral) (R>L) (midline); Chronic upper back pain (2ry area of Pain) (Bilateral) (L>R); Chronic abdominal pain (Right lower quadrant); Thoracic radiculitis (Bilateral: T10, T11); Chronic occipital neuralgia (3ry area of Pain) (Bilateral) (L>R); Chronic neck pain; Chronic cervical radicular pain (Bilateral) (L>R); Chronic shoulder blade pain (Bilateral) (L>R); Chronic upper extremity pain (Bilateral) (R>L); Chronic knee pain (Bilateral) (R>L); Chronic ankle pain (Bilateral); Cervical spondylosis with myelopathy and  radiculopathy; Nephrolithiasis; Plantar fasciitis of left foot; Cervicogenic headache; Bilateral leg edema; Chronic hip pain (Left); Chronic sacroiliac joint pain (Left); Lumbar facet syndrome (Bilateral) (L>R); Lumbar radiculitis (Left); Lumbar spondylosis; Migraine headache; Muscle spasticity; Osteoarthritis of shoulder (Bilateral); Lumbar L1-2 disc protrusion (Right); Myofascial pain syndrome (Left) (trapezius muscle); Trigger point of shoulder region (Left); Chronic fatigue syndrome with fibromyalgia; Occipital headache; Trigger point of neck (Left); Malar rash; Chronic migraine without aura, with intractable migraine, so stated, with status migrainosus; Weakness of leg (Left); Difficulty walking; Migraine with aura and with status migrainosus, not intractable; Cervico-occipital neuralgia (Left); DDD (degenerative disc disease), cervical; Cervical facet syndrome (Bilateral) (L>R); DDD (degenerative disc disease), thoracic; Osteoarthritis of hip (Left); Chronic groin pain (Bilateral) (L>R); Chronic hip pain (Bilateral) (L>R); Somatic dysfunction of sacroiliac joint (Bilateral); Other specified dorsopathies, sacral and sacrococcygeal region; Intractable migraine with aura without status migrainosus; Chronic lower extremity pain (Bilateral); Lumbar radiculitis (Right); Neurogenic urinary incontinence; Other intervertebral disc degeneration, lumbar region; Spinal stenosis of lumbosacral region; Hyperalgesia; Abnormal MRI, lumbar spine (12/27/2021); Chronic midline thoracic back pain; Abnormal MRI, thoracic spine (08/02/2019); Thoracic spinal stenosis (T7-8); Prolapse of thoracic disc with radiculopathy (T7-8); Spasm of muscle of lower back; Cervicalgia; Abnormal MRI, cervical spine (02/14/2021); Lateral epicondylitis, left elbow; Neurogenic bladder; and Urinary and fecal incontinence on their pertinent problem list. Pain Assessment: Severity of   is reported as a  /10. Location:    / . Onset:  . Quality:  . Timing:   . Modifying factor(s):  Marland Kitchen Vitals:  vitals were not taken for this visit.  BMI: Estimated body mass index is 41.2 kg/m as calculated from the following:   Height as of 09/13/22: _0  (1.6 m).   Weight as of 09/13/22: 232 lb 9.6 oz (105.5 kg).  Reason for encounter: medication management. ***  Pharmacotherapy Assessment  Analgesic: Hydrocodone/APAP 7.5/325, 1 tab PO QD.  she appears to be using less than 10 pills/month. MME/day: 3.75 mg/day.   Monitoring: [place] PMP: PDMP reviewed during this encounter.       Pharmacotherapy: No side-effects or adverse reactions reported. Compliance: No problems identified. Effectiveness: Clinically acceptable.  No notes on file  No results found for: "CBDTHCR" No results found for: "D8THCCBX" No results found for: "D9THCCBX"  UDS:  Summary  Date Value Ref Range Status  01/28/2022 Note  Final    Comment:    ==================================================================== [employer] [facility] (MW) ==================================================================== Test                             Result       Flag       Units  Drug Present and Declared for Prescription Verification   Hydromorphone                  38           EXPECTED   ng/mg creat    Hydromorphone may be administered as a scheduled prescription    medication; it is also an expected metabolite of hydrocodone.  Drug Absent but Declared for Prescription Verification   Lorazepam                      Not Detected UNEXPECTED ng/mg creat   Hydrocodone                    Not Detected UNEXPECTED ng/mg creat    Hydrocodone is almost always present in patients taking this drug    consistently. Absence of hydrocodone could be due to lapse of time    since the last dose or unusual pharmacokinetics (rapid metabolism).    Butalbital                     Not Detected UNEXPECTED ==================================================================== Test                      Result    Flag    Units      Ref Range   Creatinine              135              mg/dL      >=20 ==================================================================== Declared Medications:  The flagging and interpretation on this report are based on the  following declared medications.  Unexpected results may arise from  inaccuracies in the declared medications.   **Note: The testing scope of this panel includes these medications:   Butalbital  Hydrocodone (Norco)  Lorazepam (Ativan)   **Note: The testing scope of this panel does not include the  following reported medications:   Acetaminophen  Acetaminophen (Norco)  Amitriptyline (Elavil)  Atorvastatin (Lipitor)  Baclofen (Lioresal)  Caffeine  Carbamazepine (Tegretol)  Diclofenac (Voltaren)  Dicyclomine (Bentyl)  Dihydroergotamine (Migranal)  Meclizine (Antivert)  Medroxyprogesterone (Provera)  Metformin  Metoprolol  Omeprazole  Ondansetron (Zofran)  Pancrelipase (Creon)  Pregabalin (Lyrica)  Rimegepant (Nurtec)  Vitamin B12 ==================================================================== For clinical consultation, please call [phone]. ====================================================================       ROS  Constitutional: Denies any fever or chills Gastrointestinal: No reported hemesis, hematochezia, vomiting, or acute GI distress Musculoskeletal: Denies any acute onset joint swelling, redness, loss of ROM, or weakness Neurological: No reported episodes of acute onset apraxia, aphasia, dysarthria, agnosia, amnesia, paralysis, loss of coordination, or loss of consciousness  Medication Review  Eptinezumab-jjmr, HYDROcodone-acetaminophen, Needles & Syringes, Valerian, amitriptyline, atorvastatin, baclofen, buPROPion ER, butalbital-acetaminophen-caffeine, cyanocobalamin, diclofenac Sodium, dicyclomine, lipase/protease/amylase, meclizine, medroxyPROGESTERone, metFORMIN, metoprolol tartrate, ondansetron, pantoprazole,  pregabalin, rizatriptan, and scopolamine  History Review  Allergy: Dana Bradley is allergic to aspirin, cephalexin, cymbalta [duloxetine hcl], depakote [divalproex sodium], gadolinium derivatives, haloperidol, meperidine, metoclopramide, morphine, penicillins, prochlorperazine, tramadol hcl, trazodone, meloxicam, neomycin-bacitracin zn-polymyx, tomato, other, shellfish allergy, shellfish-derived products, bacitra-neomycin-polymyxin-hc, bacitracin-neomycin-polymyxin, cephalosporins, ibuprofen, latex, nsaids, sulfa antibiotics, and sulfonamide derivatives. Drug: Dana Bradley  reports no history of drug use. Alcohol:  reports no history of alcohol use. Tobacco:  reports that she quit smoking about 29 years ago. Her smoking use included cigarettes. She has a 6.00 pack-year smoking history. She has never used smokeless tobacco. Social: Dana Bradley  reports that she quit smoking about 29 years ago. Her smoking use included cigarettes. She has a 6.00 pack-year smoking history. She has never used smokeless tobacco. She reports that she does not drink alcohol and does not use drugs. Medical:  has a past medical history of Acute postoperative pain (04/07/2017), Anxiety, Bursitis, Chronic fatigue (12/12/2017), Chronic fatigue syndrome, Colitis (2021), Edema leg (05/02/2015), Fibromyalgia, GERD (gastroesophageal reflux disease), IBS (irritable bowel syndrome), Knee pain, bilateral (12/21/2008), Lumbar discitis, Migraines, Osteoarthritis, Right hand pain (04/10/2015), Sleep apnea, Spinal stenosis, SVT (supraventricular tachycardia), Vertigo, and Vitamin D deficiency (05/01/2016). Surgical: Dana Bradley  has a past surgical history that includes Ablation; Tubal ligation (10/01/1999); Cardiac catheterization; Knee arthroscopy; Colonoscopy with propofol (N/A, 05/17/2015); Esophagogastroduodenoscopy (N/A, 05/17/2015); spg; Tibial Tubercle Bypass (Right, 1998); Colonoscopy with propofol (N/A, 03/20/2020); and Breast biopsy (Left). Family:  family history includes Alcohol abuse in her father; Alzheimer's disease in her paternal grandmother and another family member; Aneurysm in her maternal grandfather; Anxiety disorder in her father, mother, sister, and sister; Arthritis in her maternal grandmother; Bipolar disorder in her sister; Breast cancer (age of onset: 26) in her maternal grandmother; COPD in her paternal grandfather; Cancer in her mother; Cancer (age of onset: 41) in her maternal grandmother; Depression in her father, mother, sister, and sister; Diabetes in her sister; Heart attack in her paternal grandfather; Heart disease in her father, maternal grandfather, and paternal grandfather; Hyperlipidemia in her maternal grandmother, mother, and sister; Hypertension in her father, maternal grandfather, mother, paternal grandfather, sister, and sister; Migraines in her daughter, daughter, mother, sister, sister, and son; Other in her daughter; Polycystic ovary syndrome in her sister; Stroke in her father; Thyroid disease in her maternal grandmother.  Laboratory Chemistry Profile   Renal Lab Results  Component Value Date   BUN 14 09/06/2022   CREATININE 0.81 09/06/2022   BCR SEE NOTE: 09/06/2022   GFRAA >60 02/22/2020   GFRNONAA >60 07/06/2021    Hepatic Lab Results  Component Value Date   AST 22 09/06/2022   ALT 35 (H) 09/06/2022   ALBUMIN 4.2 07/06/2021   ALKPHOS 109 07/06/2021   HCVAB NON REACTIVE 09/21/2019   LIPASE 37 09/06/2022    Electrolytes Lab Results  Component Value Date   NA 143 09/06/2022   K 4.8 09/06/2022   CL 105 09/06/2022   CALCIUM 9.9 09/06/2022   MG 2.1 04/01/2017    Bone Lab Results  Component Value Date   VD25OH 16.49 (L) 02/22/2020   25OHVITD1 20 (L) 04/01/2017   25OHVITD2 5.4 04/01/2017   25OHVITD3 15 04/01/2017    Inflammation (CRP: Acute Phase) (ESR: Chronic Phase) Lab Results  Component Value Date   CRP 6.0 07/30/2018   ESRSEDRATE 17 07/30/2018  Note: Above Lab results  reviewed.  Recent Imaging Review  CT ABDOMEN PELVIS WO CONTRAST CLINICAL DATA:  Abdominal pain.  EXAM: CT ABDOMEN AND PELVIS WITHOUT CONTRAST  TECHNIQUE: Multidetector CT imaging of the abdomen and pelvis was performed following the standard protocol without IV contrast.  RADIATION DOSE REDUCTION: This exam was performed according to the departmental dose-optimization program which includes automated exposure control, adjustment of the mA and/or kV according to patient size and/or use of iterative reconstruction technique.  COMPARISON:  CT abdomen pelvis 10/18/2019  FINDINGS: Lower chest: Normal heart size. Dependent atelectasis within the bilateral lower lobes. No pleural effusion.  Hepatobiliary: Liver is diffusely low in attenuation compatible with steatosis. Gallbladder is unremarkable.  Pancreas: Unremarkable. No pancreatic ductal dilatation or surrounding inflammatory changes.  Spleen: Normal in size without focal abnormality.  Adrenals/Urinary Tract: Normal adrenal glands. Kidneys are symmetric in size. 10 mm stone inferior pole right kidney. No hydronephrosis. Urinary bladder is unremarkable.  Stomach/Bowel: There is a focal abnormal appearing segment of transverse colon (image 48; series 2). It is unclear if this represents an area with irregular wall thickening or potentially the appearance is secondary to dense enteric contents. Additionally, there is a focal area of narrowing involving the distal transverse colon (image 62; series 2), nonspecific. Normal morphology of the stomach. Small bowel is unremarkable. No free fluid or free intraperitoneal air.  Vascular/Lymphatic: Normal caliber abdominal aorta. No retroperitoneal lymphadenopathy.  Reproductive: Uterus and adnexal structures are unremarkable.  Other: None  Musculoskeletal: No aggressive or acute appearing osseous lesions.  IMPRESSION: 1. There is a focal abnormal appearing segment of  transverse colon. It is unclear if this represents an area of irregular wall thickening or potentially the appearance is secondary to dense enteric contents. Additionally, there is a focal area of narrowing involving the distal transverse colon. Recommend further evaluation with colonoscopy to exclude the possibility of colonic malignancy. 2. Dense enteric contents within the distal colon, potentially ingested contents. Possibility of blood products not excluded. 3. Hepatic steatosis. 4. Right-sided nephrolithiasis. 5. These results will be called to the ordering clinician or representative by the Radiologist Assistant, and communication documented in the PACS or [employer].  Electronically Signed   By: Lovey Newcomer M.D.   On: 09/06/2022 11:55 Note: Reviewed        Physical Exam  General appearance: Well nourished, well developed, and well hydrated. In no apparent acute distress Mental status: Alert, oriented x 3 (person, place, & time)       Respiratory: No evidence of acute respiratory distress Eyes: PERLA Vitals: There were no vitals taken for this visit. BMI: Estimated body mass index is 41.2 kg/m as calculated from the following:   Height as of 09/13/22: _0  (1.6 m).   Weight as of 09/13/22: 232 lb 9.6 oz (105.5 kg). Ideal: Ideal body weight: 52.4 kg (115 lb 8.3 oz) Adjusted ideal body weight: 73.6 kg (162 lb 5.6 oz)  Assessment   Diagnosis Status  No diagnosis found. Controlled Controlled Controlled   Updated Problems: No problems updated.  Plan of Care  Problem-specific:  No problem-specific Assessment & Plan notes found for this encounter.  Dana Bradley has a current medication list which includes the following long-term medication(s): amitriptyline, atorvastatin, baclofen, bupropion er, diclofenac sodium, dicyclomine, hydrocodone-acetaminophen, hydrocodone-acetaminophen, hydrocodone-acetaminophen, medroxyprogesterone, metformin, metoprolol tartrate,  needles & syringes, pantoprazole, pregabalin, and rizatriptan.  Pharmacotherapy (Medications Ordered): No orders of the defined types were placed in this encounter.  Orders:  No orders of  the defined types were placed in this encounter.  Follow-up plan:   No follow-ups on file.     Interventional Therapies  Risk  Complexity Considerations:   Estimated body mass index is 38.97 kg/m as calculated from the following:   Height as of 02/13/21: _0  (1.6 m).   Weight as of 02/13/21: 220 lb (99.8 kg). Allergy to contrast. Allergy to latex. Allergy to NSAIDs. History of vasovagal episodes with spinal manipulation.   Planned  Pending:   Referral to Med Psych for SCS implant evaluation   Under consideration:   It would appear that this last therapeutic left C7-T1 cervical ESI # 3 (02/13/2021) did not provide her with any significant relief of the pain.  However, her very first cervical epidural steroid injection was done on 09/11/2016 and again caused a flareup of her pain.  Follow-up assessment indicated 80/60/70/> 50.  The procedure was then repeated on 10/30/2016 and although the patient did not keep up with the follow-up on that procedure, it would seem that her pain got under control and did not need any further interventions in the cervical region until 12/25/2017 when she had a left occipital nerve (Left) C2/TON RFA.  Unfortunately, the patient is rather noncompliant with follow-up evaluations after diagnostic and therapeutic procedures and therefore the information collected on those results is not available to contribute towards obtaining better results. Therapeutic left cervical ESI #4  Therapeutic right L4-5 LESI #2    Completed:   Neurosurgical referral for decompressive laminectomy and/or discectomy Therapeutic left C7-T1 cervical ESI x2 (10/30/2016)  Therapeutic bilateral lumbar facet block x1 (01/06/2017)  Therapeutic left SI joint block x2 (08/03/2019)  Therapeutic right L1-2 LESI  x1 (12/16/2017)  Therapeutic left L1-2 LESI x1 (04/07/2017)  Therapeutic bilateral L2 TFESI x2 (11/09/2020)  Therapeutic left L4-5 LESI x1 (09/07/2019)  Therapeutic left trapezius muscle TPI/MNB x3 (03/19/2018)  Therapeutic right trapezius muscle trigger point injection x1 (11/18/2017)  Therapeutic left occipital nerve RFA x1 (12/25/2017)  Therapeutic left C2 + TON RFA x1 (12/25/2017)  Therapeutic left caudal ESI x1 (09/28/2019)  Therapeutic right T8-9 thoracic ESI x1 (11/09/2020)    Therapeutic  Palliative (PRN) options:   Diagnostic left SI joint block #2  Diagnostic/therapeutic left IA hip joint injection #2  Diagnostic bilateral lumbar facet block #2  Palliative left trapezius muscle MNB #4  Palliative right trapezius muscle MNB #2  Palliative left L1-2 LESI #2  Palliative right L1-2 LESI #2  Palliative left CESI #3  Palliative left greater occipital NB #3  Palliative left C2 + TON NB #2  Palliative left GON + C2 + TON RFA #2 (last done 12/25/2017) Diagnostic/therapeutic bilateral L2 TFESI #2 (done on 03/07/2020) (100/100/100/>50)     Recent Visits No visits were found meeting these conditions. Showing recent visits within past 90 days and meeting all other requirements Future Appointments Date Type Provider Dept  09/18/22 Appointment Milinda Pointer, MD [facility]  Showing future appointments within next 90 days and meeting all other requirements  I discussed the assessment and treatment plan with the patient. The patient was provided an opportunity to ask questions and all were answered. The patient agreed with the plan and demonstrated an understanding of the instructions.  Patient advised to call back or seek an in-person evaluation if the symptoms or condition worsens.  Duration of encounter: *** minutes.  Total time on encounter, as per AMA guidelines included both the face-to-face and non-face-to-face time personally spent by the physician and/or other qualified  health care professional(s) on the day of the encounter (includes time in activities that require the physician or other qualified health care professional and does not include time in activities normally performed by clinical staff). Physician's time may include the following activities when performed: preparing to see the patient (eg, review of tests, pre-charting review of records) obtaining and/or reviewing separately obtained history performing a medically appropriate examination and/or evaluation counseling and educating the patient/family/caregiver ordering medications, tests, or procedures referring and communicating with other health care professionals (when not separately reported) documenting clinical information in the electronic or other health record independently interpreting results (not separately reported) and communicating results to the patient/ family/caregiver care coordination (not separately reported)  Note by: Gaspar Cola, MD Date: 09/18/2022; Time: 11:05 AM

## 2022-09-17 DIAGNOSIS — G4733 Obstructive sleep apnea (adult) (pediatric): Secondary | ICD-10-CM | POA: Diagnosis not present

## 2022-09-17 NOTE — Patient Instructions (Incomplete)
____________________________________________________________________________________________  Patient Information update  To: All of our patients.  Re: Name change.  It has been made official that our current name, "[facility]"   will soon be changed to "[facility]".   The purpose of this change is to eliminate any confusion created by the concept of our practice being a "Medication Management Pain Clinic". In the past this has led to the misconception that we treat pain primarily by the use of prescription medications.  Nothing can be farther from the truth.   Understanding PAIN MANAGEMENT: To further understand what our practice does, you first have to understand that "Pain Management" is a subspecialty that requires additional training once a physician has completed their specialty training, which can be in either Anesthesia, Neurology, Psychiatry, or Physical Medicine and Rehabilitation (PMR). Each one of these contributes to the final approach taken by each physician to the management of their patient's pain. To be a "Pain Management Specialist" you must have first completed one of the specialty trainings below.  Anesthesiologists - trained in clinical pharmacology and interventional techniques such as nerve blockade and regional as well as central neuroanatomy. They are trained to block pain before, during, and after surgical interventions.  Neurologists - trained in the diagnosis and pharmacological treatment of complex neurological conditions, such as Multiple Sclerosis, Parkinson's, spinal cord injuries, and other systemic conditions that may be associated with symptoms that may include but are not limited to pain. They tend to rely primarily on the treatment of chronic pain using prescription medications.  Psychiatrist - trained in conditions affecting the psychosocial  wellbeing of patients including but not limited to depression, anxiety, schizophrenia, personality disorders, addiction, and other substance use disorders that may be associated with chronic pain. They tend to rely primarily on the treatment of chronic pain using prescription medications.   Physical Medicine and Rehabilitation (PMR) physicians, also known as physiatrists - trained to treat a wide variety of medical conditions affecting the brain, spinal cord, nerves, bones, joints, ligaments, muscles, and tendons. Their training is primarily aimed at treating patients that have suffered injuries that have caused severe physical impairment. Their training is primarily aimed at the physical therapy and rehabilitation of those patients. They may also work alongside orthopedic surgeons or neurosurgeons using their expertise in assisting surgical patients to recover after their surgeries.  INTERVENTIONAL PAIN MANAGEMENT is sub-subspecialty of Pain Management.  Our physicians are Board-certified in Anesthesia, Pain Management, and Interventional Pain Management.  This meaning that not only have they been trained and Board-certified in their specialty of Anesthesia, and subspecialty of Pain Management, but they have also received further training in the sub-subspecialty of Interventional Pain Management, in order to become Board-certified as INTERVENTIONAL PAIN MANAGEMENT SPECIALIST.    Mission: Our goal is to use our skills in  INTERVENTIONAL PAIN MANAGEMENT as alternatives to the chronic use of prescription opioid medications for the treatment of pain. To make this more clear, we have changed our name to reflect what we do and offer. We will continue to offer medication management assessment and recommendations, but we will not be taking over any patient's medication management.  ____________________________________________________________________________________________      ____________________________________________________________________________________________  National Pain Medication Shortage  The [place] is experiencing worsening drug shortages. These have had a negative widespread effect on patient care and treatment. Not expected to improve any time soon. Predicted to last past 2029.   Drug shortage list (generic   names) Oxycodone IR Oxycodone/APAP Oxymorphone IR Hydromorphone Hydrocodone/APAP Morphine  Where is the problem?  Manufacturing and supply level.  Will this shortage affect you?  Only if you take any of the above pain medications.  How? You may be unable to fill your prescription.  Your pharmacist may offer a "partial fill" of your prescription. (Warning: Do not accept partial fills.) Prescriptions partially filled cannot be transferred to another pharmacy. Read our Medication Rules and Regulation. Depending on how much medicine you are dependent on, you may experience withdrawals when unable to get the medication.  Recommendations: Consider ending your dependence on opioid pain medications. Ask your pain specialist to assist you with the process. Consider switching to a medication currently not in shortage, such as Buprenorphine. Talk to your pain specialist about this option. Consider decreasing your pain medication requirements by managing tolerance thru "Drug Holidays". This may help minimize withdrawals, should you run out of medicine. Control your pain thru the use of non-pharmacological interventional therapies.   Your prescriber: Prescribers cannot be blamed for shortages. Medication manufacturing and supply issues cannot be fixed by the prescriber.   NOTE: The prescriber is not responsible for supplying the medication, or solving supply issues. Work with your pharmacist to solve it. The patient is responsible for the decision to take or continue taking the medication and for identifying and securing a legal supply source. By  law, supplying the medication is the job and responsibility of the pharmacy. The prescriber is responsible for the evaluation, monitoring, and prescribing of these medications.   Prescribers will NOT: Re-issue prescriptions that have been partially filled. Re-issue prescriptions already sent to a pharmacy.  Re-send prescriptions to a different pharmacy because yours did not have your medication. Ask pharmacist to order more medicine or transfer the prescription to another pharmacy. (Read below.)  New 2023 regulation: "May 31, 2022 Revised Regulation Allows DEA-Registered Pharmacies to Transfer Electronic Prescriptions at a Patient's Request [place] Patients now have the ability to request their electronic prescription be transferred to another pharmacy without having to go back to their practitioner to initiate the request. This revised regulation went into effect on Monday, May 27, 2022.     At a patient's request, a DEA-registered retail pharmacy can now transfer an electronic prescription for a controlled substance (schedules II-V) to another DEA-registered retail pharmacy. Prior to this change, patients would have to go through their practitioner to cancel their prescription and have it re-issued to a different pharmacy. The process was taxing and time consuming for both patients and practitioners.    The Drug Enforcement Administration [facility]) published its intent to revise the process for transferring electronic prescriptions on August 18, 2020.  The final rule was published in the federal register on April 25, 2022 and went into effect 30 days later.  Under the final rule, a prescription can only be transferred once between pharmacies, and only if allowed under existing state or other applicable law. The prescription must remain in its electronic form; may not be altered in any way; and the transfer must be communicated directly between  two licensed pharmacists. It's important to note, any authorized refills transfer with the original prescription, which means the entire prescription will be filled at the same pharmacy".  Reference: [email or web address] [facility] website announcement)  [email or web address] ([place])   [employer] / Vol. 88, No. 143 / Thursday, April 25, 2022 / Rules and Regulations DEPARTMENT OF JUSTICE  Drug Enforcement  Administration  21 CFR Part 1306  [Docket No. [id number]]  RIN 1117-AB64 Transfer of Electronic Prescriptions for Schedules II-V Controlled Substances Between Pharmacies for Initial Filling  ____________________________________________________________________________________________     ____________________________________________________________________________________________  Drug Holidays  What is a "Drug Holiday"? Drug Holiday: is the name given to the process of slowly tapering down and temporarily stopping the pain medication for the purpose of decreasing or eliminating tolerance to the drug.  Benefits Improved effectiveness Decreased required effective dose Improved pain control End dependence on high dose therapy Decrease cost of therapy Uncovering "opioid-induced hyperalgesia". (OIH)  What is "opioid hyperalgesia"? It is a paradoxical increase in pain caused by exposure to opioids. Stopping the opioid pain medication, contrary to the expected, it actually decreases or completely eliminates the pain. Ref.: "A comprehensive review of opioid-induced hyperalgesia". Marion Lee, et.al. Pain Physician. 2011 Mar-Apr;14(2):145-61.  What is tolerance? Tolerance: the progressive loss of effectiveness of a pain medicine due to repetitive use. A common problem of opioid pain medications.  How long should a "Drug  Holiday" last? Effectiveness depends on the patient staying off all opioid pain medicines for a minimum of 14 consecutive days. (2 weeks)  How about just taking less of the medicine? Does not work. Will not accomplish goal of eliminating the excess receptors.  How about switching to a different pain medicine? (AKA. "Opioid rotation") Does not work. Creates the illusion of effectiveness by taking advantage of inaccurate equivalent dose calculations between different opioids. -This "technique" was promoted by studies funded by pharmaceutical companies, such as [employer], creators of "OxyContin".  Can I stop the medicine "cold turkey"? Depends. You should always coordinate with your Pain Specialist to make the transition as smoothly as possible. Avoid stopping the medicine abruptly without consulting. We recommend a "slow taper".  What is a slow taper? Taper: refers to the gradual decrease in dose.   How do I stop/taper the dose? Slowly. Decrease the daily amount of pills that you take by one (1) pill every seven (7) days. This is called a "slow downward taper". Example: if you normally take four (4) pills per day, drop it to three (3) pills per day for seven (7) days, then to two (2) pills per day for seven (7) days, then to one (1) per day for seven (7) days, and then stop the medicine. The 14 day "Drug Holiday" starts on the first day without medicine.   Will I experience withdrawals? Unlikely with a slow taper.  What triggers withdrawals? Withdrawals are triggered by the sudden/abrupt stop of high dose opioids. Withdrawals can be avoided by slowly decreasing the dose over a prolonged period of time.  What are withdrawals? Symptoms associated with sudden/abrupt reduction/stopping of high-dose, long-term use of pain medication. Withdrawal are seldom seen on low dose therapy, or patients rarely taking opioid medication.  Early Withdrawal Symptoms may include: Agitation Anxiety Muscle  aches Increased tearing Insomnia Runny nose Sweating Yawning  Late symptoms may include: Abdominal cramping Diarrhea Dilated pupils Goose bumps Nausea Vomiting  (Last update: 09/08/2022) ____________________________________________________________________________________________    _______________________________________________________________________  Medication Rules  Purpose: To inform patients, and their family members, of our medication rules and regulations.  Applies to: All patients receiving prescriptions from our practice (written or electronic).  Pharmacy of record: This is the pharmacy where your electronic prescriptions will be sent. Make sure we have the correct one.  Electronic prescriptions: In compliance with the [place] Strengthen Opioid Misuse Prevention (STOP) Act of 2017 (Session Law 2017-74/H243), effective September 30, 2018, all controlled substances must be electronically prescribed. Written prescriptions, faxing, or calling prescriptions to a pharmacy will no longer be done.  Prescription refills: These will be provided only during in-person appointments. No medications will be renewed without a "face-to-face" evaluation with your provider. Applies to all prescriptions.  NOTE: The following applies primarily to controlled substances (Opioid* Pain Medications).   Type of encounter (visit): For patients receiving controlled substances, face-to-face visits are required. (Not an option and not up to the patient.)  Patient's responsibilities: Pain Pills: Bring all pain pills to every appointment (except for procedure appointments). Pill Bottles: Bring pills in original pharmacy bottle. Bring bottle, even if empty. Always bring the bottle of the most recent fill.  Medication refills: You are responsible for knowing and keeping track of what medications you are taking and when is it that you will need a refill. The day before your appointment: write a  list of all prescriptions that need to be refilled. The day of the appointment: give the list to the admitting nurse. Prescriptions will be written only during appointments. No prescriptions will be written on procedure days. If you forget a medication: it will not be "Called in", "Faxed", or "electronically sent". You will need to get another appointment to get these prescribed. No early refills. Do not call asking to have your prescription filled early. Partial  or short prescriptions: Occasionally your pharmacy may not have enough pills to fill your prescription.  NEVER ACCEPT a partial fill or a prescription that is short of the total amount of pills that you were prescribed.  With controlled substances the law allows 72 hours for the pharmacy to complete the prescription.  If the prescription is not completed within 72 hours, the pharmacist will require a new prescription to be written. This means that you will be short on your medicine and we WILL NOT send another prescription to complete your original prescription.  Instead, request the pharmacy to send a carrier to a nearby branch to get enough medication to provide you with your full prescription. Prescription Accuracy: You are responsible for carefully inspecting your prescriptions before leaving our office. Have the discharge nurse carefully go over each prescription with you, before taking them home. Make sure that your name is accurately spelled, that your address is correct. Check the name and dose of your medication to make sure it is accurate. Check the number of pills, and the written instructions to make sure they are clear and accurate. Make sure that you are given enough medication to last until your next medication refill appointment. Taking Medication: Take medication as prescribed. When it comes to controlled substances, taking less pills or less frequently than prescribed is permitted and encouraged. Never take more pills than  instructed. Never take the medication more frequently than prescribed.  Inform other Doctors: Always inform, all of your healthcare providers, of all the medications you take. Pain Medication from other Providers: You are not allowed to accept any additional pain medication from any other Doctor or Healthcare provider. There are two exceptions to this rule. (see below) In the event that you require additional pain medication, you are responsible for notifying us, as stated below. Cough Medicine: Often these contain an opioid, such as codeine or hydrocodone. Never accept or take cough medicine containing these opioids if you are already taking an opioid* medication. The combination may cause respiratory failure and death. Medication Agreement: You are responsible for carefully reading and following our Medication Agreement. This   must be signed before receiving any prescriptions from our practice. Safely store a copy of your signed Agreement. Violations to the Agreement will result in no further prescriptions. (Additional copies of our Medication Agreement are available upon request.) Laws, Rules, & Regulations: All patients are expected to follow all Federal and State Laws, Statutes, Rules, & Regulations. Ignorance of the Laws does not constitute a valid excuse.  Illegal drugs and Controlled Substances: The use of illegal substances (including, but not limited to marijuana and its derivatives) and/or the illegal use of any controlled substances is strictly prohibited. Violation of this rule may result in the immediate and permanent discontinuation of any and all prescriptions being written by our practice. The use of any illegal substances is prohibited. Adopted CDC guidelines & recommendations: Target dosing levels will be at or below 60 MME/day. Use of benzodiazepines** is not recommended.  Exceptions: There are only two exceptions to the rule of not receiving pain medications from other Healthcare  Providers. Exception #1 (Emergencies): In the event of an emergency (i.e.: accident requiring emergency care), you are allowed to receive additional pain medication. However, you are responsible for: As soon as you are able, call our office [phone], at any time of the day or night, and leave a message stating your name, the date and nature of the emergency, and the name and dose of the medication prescribed. In the event that your call is answered by a member of our staff, make sure to document and save the date, time, and the name of the person that took your information.  Exception #2 (Planned Surgery): In the event that you are scheduled by another doctor or dentist to have any type of surgery or procedure, you are allowed (for a period no longer than 30 days), to receive additional pain medication, for the acute post-op pain. However, in this case, you are responsible for picking up a copy of our "Post-op Pain Management for Surgeons" handout, and giving it to your surgeon or dentist. This document is available at our office, and does not require an appointment to obtain it. Simply go to our office during business hours (Monday-Thursday from 8:00 AM to 4:00 PM) (Friday 8:00 AM to 12:00 Noon) or if you have a scheduled appointment with us, prior to your surgery, and ask for it by name. In addition, you are responsible for: calling our office [phone], at any time of the day or night, and leaving a message stating your name, name of your surgeon, type of surgery, and date of procedure or surgery. Failure to comply with your responsibilities may result in termination of therapy involving the controlled substances. Medication Agreement Violation. Following the above rules, including your responsibilities will help you in avoiding a Medication Agreement Violation ("Breaking your Pain Medication Contract").  Consequences:  Not following the above rules may result in permanent discontinuation of  medication prescription therapy.  *Opioid medications include: morphine, codeine, oxycodone, oxymorphone, hydrocodone, hydromorphone, meperidine, tramadol, tapentadol, buprenorphine, fentanyl, methadone. **Benzodiazepine medications include: diazepam (Valium), alprazolam (Xanax), clonazepam (Klonopine), lorazepam (Ativan), clorazepate (Tranxene), chlordiazepoxide (Librium), estazolam (Prosom), oxazepam (Serax), temazepam (Restoril), triazolam (Halcion) (Last updated: 07/23/2022) ______________________________________________________________________    ______________________________________________________________________  Medication Recommendations and Reminders  Applies to: All patients receiving prescriptions (written and/or electronic).  Medication Rules & Regulations: You are responsible for reading, knowing, and following our "Medication Rules" document. These exist for your safety and that of others. They are not flexible and neither are we. Dismissing or ignoring them is an   act of "non-compliance" that may result in complete and irreversible termination of such medication therapy. For safety reasons, "non-compliance" will not be tolerated. As with the [place] fundamental legal principle of "ignorance of the law is no defense", we will accept no excuses for not having read and knowing the content of documents provided to you by our practice.  Pharmacy of record:  Definition: This is the pharmacy where your electronic prescriptions will be sent.  We do not endorse any particular pharmacy. It is up to you and your insurance to decide what pharmacy to use.  We do not restrict you in your choice of pharmacy. However, once we write for your prescriptions, we will NOT be re-sending more prescriptions to fix restricted supply problems created by your pharmacy, or your insurance.  The pharmacy listed in the electronic medical record should be the one where you want electronic prescriptions to be  sent. If you choose to change pharmacy, simply notify our nursing staff. Changes will be made only during your regular appointments and not over the phone.  Recommendations: Keep all of your pain medications in a safe place, under lock and key, even if you live alone. We will NOT replace lost, stolen, or damaged medication. We do not accept "Police Reports" as proof of medications having been stolen. After you fill your prescription, take 1 week's worth of pills and put them away in a safe place. You should keep a separate, properly labeled bottle for this purpose. The remainder should be kept in the original bottle. Use this as your primary supply, until it runs out. Once it's gone, then you know that you have 1 week's worth of medicine, and it is time to come in for a prescription refill. If you do this correctly, it is unlikely that you will ever run out of medicine. To make sure that the above recommendation works, it is very important that you make sure your medication refill appointments are scheduled at least 1 week before you run out of medicine. To do this in an effective manner, make sure that you do not leave the office without scheduling your next medication management appointment. Always ask the nursing staff to show you in your prescription , when your medication will be running out. Then arrange for the receptionist to get you a return appointment, at least 7 days before you run out of medicine. Do not wait until you have 1 or 2 pills left, to come in. This is very poor planning and does not take into consideration that we may need to cancel appointments due to bad weather, sickness, or emergencies affecting our staff. DO NOT ACCEPT A "Partial Fill": If for any reason your pharmacy does not have enough pills/tablets to completely fill or refill your prescription, do not allow for a "partial fill". The law allows the pharmacy to complete that prescription within 72 hours, without requiring a new  prescription. If they do not fill the rest of your prescription within those 72 hours, you will need a separate prescription to fill the remaining amount, which we will NOT provide. If the reason for the partial fill is your insurance, you will need to talk to the pharmacist about payment alternatives for the remaining tablets, but again, DO NOT ACCEPT A PARTIAL FILL, unless you can trust your pharmacist to obtain the remainder of the pills within 72 hours.  Prescription refills and/or changes in medication(s):  Prescription refills, and/or changes in dose or medication, will be conducted only   during scheduled medication management appointments. (Applies to both, written and electronic prescriptions.) No refills on procedure days. No medication will be changed or started on procedure days. No changes, adjustments, and/or refills will be conducted on a procedure day. Doing so will interfere with the diagnostic portion of the procedure. No phone refills. No medications will be "called into the pharmacy". No Fax refills. No weekend refills. No Holliday refills. No after hours refills.  Remember:  Business hours are:  Monday to Thursday 8:00 AM to 4:00 PM Provider's Schedule: Sandralee Tarkington, MD - Appointments are:  Medication management: Monday and Wednesday 8:00 AM to 4:00 PM Procedure day: Tuesday and Thursday 7:30 AM to 4:00 PM Bilal Lateef, MD - Appointments are:  Medication management: Tuesday and Thursday 8:00 AM to 4:00 PM Procedure day: Monday and Wednesday 7:30 AM to 4:00 PM (Last update: 07/23/2022) ______________________________________________________________________    ____________________________________________________________________________________________  WARNING: CBD (cannabidiol) & Delta (Delta-8 tetrahydrocannabinol) products.   Applicable to:  All individuals currently taking or considering taking CBD (cannabidiol) and, more important, all patients taking opioid  analgesic controlled substances (pain medication). (Example: oxycodone; oxymorphone; hydrocodone; hydromorphone; morphine; methadone; tramadol; tapentadol; fentanyl; buprenorphine; butorphanol; dextromethorphan; meperidine; codeine; etc.)  Introduction:  Recently there has been a drive towards the use of "natural" products for the treatment of different conditions, including pain anxiety and sleep disorders. Marijuana and hemp are two varieties of the cannabis genus plants. Marijuana and its derivatives are illegal, while hemp and its derivatives are not. Cannabidiol (CBD) and tetrahydrocannabinol (THC), are two natural compounds found in plants of the Cannabis genus. They can both be extracted from hemp or marijuana. Both compounds interact with your body's endocannabinoid system in very different ways. CBD is associated with pain relief (analgesia) while THC is associated with the psychoactive effects ("the high") obtained from the use of marijuana products. There are two main types of THC: Delta-9, which comes from the marijuana plant and it is illegal, and Delta-8, which comes from the hemp plant, and it is legal. (Both, Delta-9-THC and Delta-8-THC are psychoactive and give you "the high".)   Legality:  Marijuana and its derivatives: illegal Hemp and its derivatives: Legal (State dependent) UPDATE: (11/16/2021) The Drug Enforcement Agency (DEA) issued a letter stating that "delta" cannabinoids, including Delta-8-THCO and Delta-9-THCO, synthetically derived from hemp do not qualify as hemp and will be viewed as Schedule I drugs. (Schedule I drugs, substances, or chemicals are defined as drugs with no currently accepted medical use and a high potential for abuse. Some examples of Schedule I drugs are: heroin, lysergic acid diethylamide (LSD), marijuana (cannabis), 3,4-methylenedioxymethamphetamine (ecstasy), methaqualone, and peyote.) (https://www.dea.gov)  Legal status of CBD in [place]:  "Conditionally  Legal"  Reference: "FDA Regulation of Cannabis and Cannabis-Derived Products, Including Cannabidiol (CBD)" - https://www.fda.gov/news-events/public-health-focus/fda-regulation-cannabis-and-cannabis-derived-products-including-cannabidiol-cbd  Warning:  CBD is not FDA approved and has not undergo the same manufacturing controls as prescription drugs.  This means that the purity and safety of available CBD may be questionable. Most of the time, despite manufacturer's claims, it is contaminated with THC (delta-9-tetrahydrocannabinol - the chemical in marijuana responsible for the "HIGH").  When this is the case, the THC contaminant will trigger a positive urine drug screen (UDS) test for Marijuana (carboxy-THC).   The FDA recently put out a warning about 5 things that everyone should be aware of regarding Delta-8 THC: Delta-8 THC products have not been evaluated or approved by the FDA for safe use and may be marketed in ways that put the public health at   risk. The FDA has received adverse event reports involving delta-8 THC-containing products. Delta-8 THC has psychoactive and intoxicating effects. Delta-8 THC manufacturing often involve use of potentially harmful chemicals to create the concentrations of delta-8 THC claimed in the marketplace. The final delta-8 THC product may have potentially harmful by-products (contaminants) due to the chemicals used in the process. Manufacturing of delta-8 THC products may occur in uncontrolled or unsanitary settings, which may lead to the presence of unsafe contaminants or other potentially harmful substances. Delta-8 THC products should be kept out of the reach of children and pets.  NOTE: Because a positive UDS for any illicit substance is a violation of our medication agreement, your opioid analgesics (pain medicine) may be permanently discontinued.  MORE ABOUT CBD  General Information: CBD was discovered in 1940 and it is a derivative of the cannabis sativa  genus plants (Marijuana and Hemp). It is one of the 113 identified substances found in Marijuana. It accounts for up to 40% of the plant's extract. As of 2018, preliminary clinical studies on CBD included research for the treatment of anxiety, movement disorders, and pain. CBD is available and consumed in multiple forms, including inhalation of smoke or vapor, as an aerosol spray, and by mouth. It may be supplied as an oil containing CBD, capsules, dried cannabis, or as a liquid solution. CBD is thought not to be as psychoactive as THC (delta-9-tetrahydrocannabinol - the chemical in marijuana responsible for the "HIGH"). Studies suggest that CBD may interact with different biological target receptors in the body, including cannabinoid and other neurotransmitter receptors. As of 2018 the mechanism of action for its biological effects has not been determined.  Side-effects  Adverse reactions: Dry mouth, diarrhea, decreased appetite, fatigue, drowsiness, malaise, weakness, sleep disturbances, and others.  Drug interactions:  CBD may interact with medications such as blood-thinners. CBD causes drowsiness on its own and it will increase drowsiness caused by other medications, including antihistamines (such as Benadryl), benzodiazepines (Xanax, Ativan, Valium), antipsychotics, antidepressants, opioids, alcohol and supplements such as kava, melatonin and St. John's Wort.  Other drug interactions: Brivaracetam (Briviact); Caffeine; Carbamazepine (Tegretol); Citalopram (Celexa); Clobazam (Onfi); Eslicarbazepine (Aptiom); Everolimus (Zostress); Lithium; Methadone (Dolophine); Rufinamide (Banzel); Sedative medications (CNS depressants); Sirolimus (Rapamune); Stiripentol (Diacomit); Tacrolimus (Prograf); Tamoxifen ; Soltamox); Topiramate (Topamax); Valproate; Warfarin (Coumadin); Zonisamide. (Last update: 09/09/2022) ____________________________________________________________________________________________    ____________________________________________________________________________________________  Naloxone Nasal Spray  Why am I receiving this medication? [place] STOP ACT requires that all patients taking high dose opioids or at risk of opioids respiratory depression, be prescribed an opioid reversal agent, such as Naloxone (AKA: Narcan).  What is this medication? NALOXONE (nal OX one) treats opioid overdose, which causes slow or shallow breathing, severe drowsiness, or trouble staying awake. Call emergency services after using this medication. You may need additional treatment. Naloxone works by reversing the effects of opioids. It belongs to a group of medications called opioid blockers.  COMMON BRAND NAME(S): Kloxxado, Narcan  What should I tell my care team before I take this medication? They need to know if you have any of these conditions: Heart disease Substance use disorder An unusual or allergic reaction to naloxone, other medications, foods, dyes, or preservatives Pregnant or trying to get pregnant Breast-feeding  When to use this medication? This medication is to be used for the treatment of respiratory depression (less than 8 breaths per minute) secondary to opioid overdose.   How to use this medication? This medication is for use in the nose. Lay the person on their   back. Support their neck with your hand and allow the head to tilt back before giving the medication. The nasal spray should be given into 1 nostril. After giving the medication, move the person onto their side. Do not remove or test the nasal spray until ready to use. Get emergency medical help right away after giving the first dose of this medication, even if the person wakes up. You should be familiar with how to recognize the signs and symptoms of a narcotic overdose. If more doses are needed, give the additional dose in the other nostril. Talk to your care team about the use of this medication in children.  While this medication may be prescribed for children as young as newborns for selected conditions, precautions do apply.  Naloxone Overdosage: If you think you have taken too much of this medicine contact a poison control center or emergency room at once.  NOTE: This medicine is only for you. Do not share this medicine with others.  What if I miss a dose? This does not apply.  What may interact with this medication? This is only used during an emergency. No interactions are expected during emergency use. This list may not describe all possible interactions. Give your health care provider a list of all the medicines, herbs, non-prescription drugs, or dietary supplements you use. Also tell them if you smoke, drink alcohol, or use illegal drugs. Some items may interact with your medicine.  What should I watch for while using this medication? Keep this medication ready for use in the case of an opioid overdose. Make sure that you have the phone number of your care team and local hospital ready. You may need to have additional doses of this medication. Each nasal spray contains a single dose. Some emergencies may require additional doses. After use, bring the treated person to the nearest hospital or call 911. Make sure the treating care team knows that the person has received a dose of this medication. You will receive additional instructions on what to do during and after use of this medication before an emergency occurs.  What side effects may I notice from receiving this medication? Side effects that you should report to your care team as soon as possible: Allergic reactions--skin rash, itching, hives, swelling of the face, lips, tongue, or throat Side effects that usually do not require medical attention (report these to your care team if they continue or are bothersome): Constipation Dryness or irritation inside the nose Headache Increase in blood pressure Muscle spasms Stuffy  nose Toothache This list may not describe all possible side effects. Call your doctor for medical advice about side effects. You may report side effects to FDA at 1-800-FDA-1088.  Where should I keep my medication? Because this is an emergency medication, you should keep it with you at all times.  Keep out of the reach of children and pets. Store between 20 and 25 degrees C (68 and 77 degrees F). Do not freeze. Throw away any unused medication after the expiration date. Keep in original box until ready to use.  NOTE: This sheet is a summary. It may not cover all possible information. If you have questions about this medicine, talk to your doctor, pharmacist, or health care provider.   2023 Elsevier/Gold Standard (2021-05-25 00:00:00)  ____________________________________________________________________________________________   

## 2022-09-18 ENCOUNTER — Ambulatory Visit
Admission: RE | Admit: 2022-09-18 | Discharge: 2022-09-18 | Disposition: A | Discharge status: Home / Self Care | Payer: Medicaid Other | Source: Ambulatory Visit | Attending: Pain Medicine | Admitting: Pain Medicine

## 2022-09-18 ENCOUNTER — Encounter: Payer: Self-pay | Admitting: Pain Medicine

## 2022-09-18 ENCOUNTER — Ambulatory Visit (HOSPITAL_BASED_OUTPATIENT_CLINIC_OR_DEPARTMENT_OTHER): Payer: Medicaid Other | Admitting: Pain Medicine

## 2022-09-18 VITALS — BP 135/79 | HR 104 | Temp 97.0°F | Resp 18 | Ht 63.0 in | Wt 233.0 lb

## 2022-09-18 DIAGNOSIS — M5481 Occipital neuralgia: Secondary | ICD-10-CM | POA: Insufficient documentation

## 2022-09-18 DIAGNOSIS — R937 Abnormal findings on diagnostic imaging of other parts of musculoskeletal system: Secondary | ICD-10-CM | POA: Insufficient documentation

## 2022-09-18 DIAGNOSIS — M5441 Lumbago with sciatica, right side: Secondary | ICD-10-CM | POA: Insufficient documentation

## 2022-09-18 DIAGNOSIS — M25551 Pain in right hip: Secondary | ICD-10-CM | POA: Insufficient documentation

## 2022-09-18 DIAGNOSIS — R252 Cramp and spasm: Secondary | ICD-10-CM | POA: Insufficient documentation

## 2022-09-18 DIAGNOSIS — G8929 Other chronic pain: Secondary | ICD-10-CM | POA: Insufficient documentation

## 2022-09-18 DIAGNOSIS — M25562 Pain in left knee: Secondary | ICD-10-CM | POA: Insufficient documentation

## 2022-09-18 DIAGNOSIS — M549 Dorsalgia, unspecified: Secondary | ICD-10-CM | POA: Insufficient documentation

## 2022-09-18 DIAGNOSIS — M25552 Pain in left hip: Secondary | ICD-10-CM | POA: Insufficient documentation

## 2022-09-18 DIAGNOSIS — G894 Chronic pain syndrome: Secondary | ICD-10-CM | POA: Insufficient documentation

## 2022-09-18 DIAGNOSIS — M5442 Lumbago with sciatica, left side: Secondary | ICD-10-CM | POA: Insufficient documentation

## 2022-09-18 DIAGNOSIS — M79604 Pain in right leg: Secondary | ICD-10-CM | POA: Insufficient documentation

## 2022-09-18 DIAGNOSIS — R1032 Left lower quadrant pain: Secondary | ICD-10-CM | POA: Insufficient documentation

## 2022-09-18 DIAGNOSIS — R1031 Right lower quadrant pain: Secondary | ICD-10-CM | POA: Insufficient documentation

## 2022-09-18 DIAGNOSIS — M19012 Primary osteoarthritis, left shoulder: Secondary | ICD-10-CM | POA: Insufficient documentation

## 2022-09-18 DIAGNOSIS — M19011 Primary osteoarthritis, right shoulder: Secondary | ICD-10-CM | POA: Insufficient documentation

## 2022-09-18 DIAGNOSIS — Z79899 Other long term (current) drug therapy: Secondary | ICD-10-CM | POA: Insufficient documentation

## 2022-09-18 DIAGNOSIS — Z79891 Long term (current) use of opiate analgesic: Secondary | ICD-10-CM | POA: Insufficient documentation

## 2022-09-18 DIAGNOSIS — M79605 Pain in left leg: Secondary | ICD-10-CM | POA: Insufficient documentation

## 2022-09-18 DIAGNOSIS — M25561 Pain in right knee: Secondary | ICD-10-CM | POA: Insufficient documentation

## 2022-09-18 DIAGNOSIS — M62838 Other muscle spasm: Secondary | ICD-10-CM | POA: Insufficient documentation

## 2022-09-18 DIAGNOSIS — M160 Bilateral primary osteoarthritis of hip: Secondary | ICD-10-CM | POA: Diagnosis not present

## 2022-09-18 MED ORDER — NALOXONE HCL 4 MG/0.1ML NA LIQD: 1.0000 | NASAL | 0 refills | Status: AC | PRN

## 2022-09-18 MED ORDER — HYDROCODONE-ACETAMINOPHEN 7.5-325 MG PO TABS: 1.0000 | ORAL_TABLET | Freq: Two times a day (BID) | ORAL | 0 refills | Status: DC | PRN

## 2022-09-18 NOTE — Progress Notes (Signed)
Nursing Pain Medication Assessment:  Safety precautions to be maintained throughout the outpatient stay will include: orient to surroundings, keep bed in low position, maintain call bell within reach at all times, provide assistance with transfer out of bed and ambulation.  Medication Inspection Compliance: Pill count conducted under aseptic conditions, in front of the patient. Neither the pills nor the bottle was removed from the patient's sight at any time. Once count was completed pills were immediately returned to the patient in their original bottle.  Medication: Hydrocodone/APAP Pill/Patch Count:  2 of 45 pills remain Pill/Patch Appearance: Markings consistent with prescribed medication Bottle Appearance: Standard pharmacy container. Clearly labeled. Filled Date: 71 / 06 / 2023 Last Medication intake:  Today

## 2022-09-20 ENCOUNTER — Encounter: Payer: Self-pay | Admitting: Gastroenterology

## 2022-09-25 ENCOUNTER — Ambulatory Visit: Payer: Medicaid Other | Admitting: General Practice

## 2022-09-25 ENCOUNTER — Encounter
Admission: RE | Disposition: A | Discharge status: Home / Self Care | Payer: Self-pay | Source: Home / Self Care | Attending: Gastroenterology

## 2022-09-25 ENCOUNTER — Ambulatory Visit
Admission: RE | Admit: 2022-09-25 | Discharge: 2022-09-25 | Disposition: A | Discharge status: Home / Self Care | Payer: Medicaid Other | Attending: Gastroenterology | Admitting: Gastroenterology

## 2022-09-25 DIAGNOSIS — Z538 Procedure and treatment not carried out for other reasons: Secondary | ICD-10-CM | POA: Insufficient documentation

## 2022-09-25 DIAGNOSIS — K625 Hemorrhage of anus and rectum: Secondary | ICD-10-CM | POA: Diagnosis not present

## 2022-09-25 DIAGNOSIS — R935 Abnormal findings on diagnostic imaging of other abdominal regions, including retroperitoneum: Secondary | ICD-10-CM | POA: Diagnosis not present

## 2022-09-25 DIAGNOSIS — K5900 Constipation, unspecified: Secondary | ICD-10-CM | POA: Diagnosis not present

## 2022-09-25 DIAGNOSIS — K5909 Other constipation: Secondary | ICD-10-CM

## 2022-09-25 HISTORY — PX: ESOPHAGOGASTRODUODENOSCOPY: SHX5428

## 2022-09-25 HISTORY — PX: COLONOSCOPY WITH PROPOFOL: SHX5780

## 2022-09-25 SURGERY — COLONOSCOPY WITH PROPOFOL
Anesthesia: General

## 2022-09-25 MED ORDER — SODIUM CHLORIDE 0.9 % IV SOLN: INTRAVENOUS | Status: DC

## 2022-09-25 NOTE — OR Nursing (Signed)
States she feels that she does not have adequate bowel prep. Dr. Vicente Males in to speak with patient. Case cancelled for today. Rescheduled for tomorrow.

## 2022-09-26 ENCOUNTER — Encounter
Admission: RE | Disposition: A | Discharge status: Home / Self Care | Payer: Self-pay | Source: Home / Self Care | Attending: Gastroenterology

## 2022-09-26 ENCOUNTER — Other Ambulatory Visit: Payer: Self-pay

## 2022-09-26 ENCOUNTER — Telehealth: Payer: Self-pay

## 2022-09-26 ENCOUNTER — Ambulatory Visit
Admission: RE | Admit: 2022-09-26 | Discharge: 2022-09-26 | Disposition: A | Discharge status: Home / Self Care | Payer: Medicaid Other | Attending: Gastroenterology | Admitting: Gastroenterology

## 2022-09-26 ENCOUNTER — Ambulatory Visit: Payer: Medicaid Other | Admitting: Certified Registered"

## 2022-09-26 ENCOUNTER — Encounter: Payer: Self-pay | Admitting: Gastroenterology

## 2022-09-26 DIAGNOSIS — R1013 Epigastric pain: Secondary | ICD-10-CM | POA: Diagnosis not present

## 2022-09-26 DIAGNOSIS — Z87891 Personal history of nicotine dependence: Secondary | ICD-10-CM | POA: Insufficient documentation

## 2022-09-26 DIAGNOSIS — G4730 Sleep apnea, unspecified: Secondary | ICD-10-CM | POA: Diagnosis not present

## 2022-09-26 DIAGNOSIS — I472 Ventricular tachycardia, unspecified: Secondary | ICD-10-CM | POA: Diagnosis not present

## 2022-09-26 DIAGNOSIS — M1990 Unspecified osteoarthritis, unspecified site: Secondary | ICD-10-CM | POA: Insufficient documentation

## 2022-09-26 DIAGNOSIS — K921 Melena: Secondary | ICD-10-CM | POA: Diagnosis not present

## 2022-09-26 DIAGNOSIS — R935 Abnormal findings on diagnostic imaging of other abdominal regions, including retroperitoneum: Secondary | ICD-10-CM | POA: Diagnosis not present

## 2022-09-26 DIAGNOSIS — K644 Residual hemorrhoidal skin tags: Secondary | ICD-10-CM | POA: Insufficient documentation

## 2022-09-26 DIAGNOSIS — F419 Anxiety disorder, unspecified: Secondary | ICD-10-CM | POA: Diagnosis not present

## 2022-09-26 DIAGNOSIS — F32A Depression, unspecified: Secondary | ICD-10-CM | POA: Diagnosis not present

## 2022-09-26 DIAGNOSIS — K5909 Other constipation: Secondary | ICD-10-CM

## 2022-09-26 DIAGNOSIS — K589 Irritable bowel syndrome without diarrhea: Secondary | ICD-10-CM | POA: Insufficient documentation

## 2022-09-26 DIAGNOSIS — I10 Essential (primary) hypertension: Secondary | ICD-10-CM | POA: Diagnosis not present

## 2022-09-26 DIAGNOSIS — K219 Gastro-esophageal reflux disease without esophagitis: Secondary | ICD-10-CM | POA: Diagnosis not present

## 2022-09-26 DIAGNOSIS — Z5309 Procedure and treatment not carried out because of other contraindication: Secondary | ICD-10-CM | POA: Insufficient documentation

## 2022-09-26 DIAGNOSIS — E119 Type 2 diabetes mellitus without complications: Secondary | ICD-10-CM | POA: Insufficient documentation

## 2022-09-26 DIAGNOSIS — G9332 Myalgic encephalomyelitis/chronic fatigue syndrome: Secondary | ICD-10-CM | POA: Diagnosis not present

## 2022-09-26 DIAGNOSIS — R519 Headache, unspecified: Secondary | ICD-10-CM | POA: Diagnosis not present

## 2022-09-26 DIAGNOSIS — K625 Hemorrhage of anus and rectum: Secondary | ICD-10-CM

## 2022-09-26 DIAGNOSIS — M797 Fibromyalgia: Secondary | ICD-10-CM | POA: Insufficient documentation

## 2022-09-26 HISTORY — PX: ESOPHAGOGASTRODUODENOSCOPY: SHX5428

## 2022-09-26 HISTORY — PX: COLONOSCOPY: SHX5424

## 2022-09-26 HISTORY — DX: Type 2 diabetes mellitus without complications: E11.9

## 2022-09-26 LAB — GLUCOSE, CAPILLARY: Glucose-Capillary: 180 mg/dL — ABNORMAL HIGH (ref 70–99)

## 2022-09-26 SURGERY — COLONOSCOPY
Anesthesia: General

## 2022-09-26 MED ORDER — SODIUM CHLORIDE 0.9 % IV SOLN: INTRAVENOUS | Status: DC

## 2022-09-26 MED ORDER — PROPOFOL 10 MG/ML IV BOLUS
INTRAVENOUS | Status: DC | PRN
  Administered 2022-09-26: 70 mg via INTRAVENOUS

## 2022-09-26 MED ORDER — LIDOCAINE HCL (CARDIAC) PF 100 MG/5ML IV SOSY
PREFILLED_SYRINGE | INTRAVENOUS | Status: DC | PRN
  Administered 2022-09-26: 100 mg via INTRAVENOUS

## 2022-09-26 MED ORDER — PEG 3350-KCL-NA BICARB-NACL 420 G PO SOLR: ORAL | 0 refills | Status: DC

## 2022-09-26 MED ORDER — PROPOFOL 500 MG/50ML IV EMUL
INTRAVENOUS | Status: DC | PRN
  Administered 2022-09-26: 130 ug/kg/min via INTRAVENOUS

## 2022-09-26 MED ORDER — DEXMEDETOMIDINE HCL IN NACL 80 MCG/20ML IV SOLN
INTRAVENOUS | Status: DC | PRN
  Administered 2022-09-26: 8 ug via BUCCAL

## 2022-09-26 NOTE — H&P (Signed)
Jonathon Bellows, MD [address] [place], [place], [place], [address] [address], [place], [place], [place], [address] Phone: [phone]  Fax: [phone]  Primary Care Physician:  Delsa Grana, PA-C   Pre-Procedure History & Physical: HPI:  Dana Bradley is a 51 y.o. female is here for an endoscopy and colonoscopy    Past Medical History:  Diagnosis Date   Acute postoperative pain 04/07/2017   Anxiety    Bursitis    Chronic fatigue 12/12/2017   Chronic fatigue syndrome    Colitis 2021   Diabetes mellitus without complication ([place])    Edema leg 05/02/2015   Fibromyalgia    GERD (gastroesophageal reflux disease)    IBS (irritable bowel syndrome)    Knee pain, bilateral 12/21/2008   Qualifier: Diagnosis of  By: Hassell Done FNP, [place]     Lumbar discitis    Migraines    Osteoarthritis    Right hand pain 04/10/2015   [facility] has done nerve conduction studies and ruled out carpal tunnel.    Sleep apnea    Spinal stenosis    SVT (supraventricular tachycardia)    Vertigo    Vitamin D deficiency 05/01/2016    Past Surgical History:  Procedure Laterality Date   ABLATION     Uterine   BREAST BIOPSY Left    2014 U/S bx fibroadenoma   CARDIAC CATHETERIZATION     with ablation   COLONOSCOPY WITH PROPOFOL N/A 05/17/2015   Procedure: COLONOSCOPY WITH PROPOFOL;  Surgeon: Manya Silvas, MD;  Location: [facility] ENDOSCOPY;  Service: Endoscopy;  Laterality: N/A;   COLONOSCOPY WITH PROPOFOL N/A 03/20/2020   Procedure: COLONOSCOPY WITH PROPOFOL;  Surgeon: Jonathon Bellows, MD;  Location: [facility] ENDOSCOPY;  Service: Gastroenterology;  Laterality: N/A;   COLONOSCOPY WITH PROPOFOL N/A 09/25/2022   Procedure: COLONOSCOPY WITH PROPOFOL;  Surgeon: Jonathon Bellows, MD;  Location: [facility] ENDOSCOPY;  Service: Gastroenterology;  Laterality: N/A;   ESOPHAGOGASTRODUODENOSCOPY N/A 05/17/2015   Procedure: ESOPHAGOGASTRODUODENOSCOPY (EGD);  Surgeon: Manya Silvas, MD;  Location: [facility]  ENDOSCOPY;  Service: Endoscopy;  Laterality: N/A;   ESOPHAGOGASTRODUODENOSCOPY N/A 09/25/2022   Procedure: ESOPHAGOGASTRODUODENOSCOPY (EGD);  Surgeon: Jonathon Bellows, MD;  Location: [facility] ENDOSCOPY;  Service: Gastroenterology;  Laterality: N/A;   KNEE ARTHROSCOPY     spg     6/18   Tibial Tubercle Bypass Right 1998   TUBAL LIGATION  10/01/1999    Prior to Admission medications   Medication Sig Start Date End Date Taking? Authorizing Provider  amitriptyline (ELAVIL) 75 MG tablet Take 1 tablet (75 mg total) by mouth at bedtime. 03/28/22  Yes Raulkar, Clide Deutscher, MD  atorvastatin (LIPITOR) 40 MG tablet TAKE 1 TABLET (40 MG TOTAL) BY MOUTH AT BEDTIME. 02/26/22 02/26/23 Yes Tapia, Kristeen Miss, PA-C  buPROPion ER (WELLBUTRIN SR) 100 MG 12 hr tablet TAKE 1 TABLET BY MOUTH 2 TIMES DAILY 09/09/22  Yes Raulkar, Clide Deutscher, MD  butalbital-acetaminophen-caffeine (BAC) 50-325-40 MG tablet Take 1 tablet by mouth every 4 (four) hours as needed for migraine. 06/07/22  Yes Raulkar, Clide Deutscher, MD  cyanocobalamin (,VITAMIN B-12,) 1000 MCG/ML injection INJECT 1 ML (1,000 MCG) INTO THE MUSCLE ONCE FOR 1 DOSE 03/26/22  Yes Raulkar, Clide Deutscher, MD  Eptinezumab-jjmr (VYEPTI) 100 MG/ML injection Inject 1 mL (100 mg total) into the vein every 3 (three) months. 03/01/22  Yes Raulkar, Clide Deutscher, MD  HYDROcodone-acetaminophen (NORCO) 7.5-325 MG tablet Take 1 tablet by mouth 2 (two) times daily as needed for severe pain. Must last 30 days 09/18/22  10/18/22 Yes Milinda Pointer, MD  HYDROcodone-acetaminophen (NORCO) 7.5-325 MG tablet Take 1 tablet by mouth 2 (two) times daily as needed for severe pain. Must last 30 days 10/18/22 11/17/22 Yes Milinda Pointer, MD  HYDROcodone-acetaminophen (NORCO) 7.5-325 MG tablet Take 1 tablet by mouth 2 (two) times daily as needed for severe pain. Must last 30 days 11/17/22 12/17/22 Yes Milinda Pointer, MD  lipase/protease/amylase (CREON) 12000-38000 units CPEP capsule Take 2 capsules by mouth with the first  bite of each meal and take 1 capsule before snacks. (Max of 8 capsules per day). 09/19/21  Yes Jonathon Bellows, MD  meclizine (ANTIVERT) 12.5 MG tablet Take 1 tablet (12.5 mg total) by mouth 3 (three) times daily as needed for dizziness. 09/28/21  Yes Raulkar, Clide Deutscher, MD  medroxyPROGESTERone (PROVERA) 5 MG tablet TAKE 1 TABLET BY MOUTH DAILY 05/17/22  Yes Harlin Heys, MD  metFORMIN (GLUCOPHAGE) 1000 MG tablet Take 1 tablet (1,000 mg total) by mouth 2 (two) times daily with a meal. 06/12/22  Yes Delsa Grana, PA-C  metoprolol tartrate (LOPRESSOR) 50 MG tablet TAKE 1 AND 1/2 TABLET BY MOUTH 2 TIMES DAILY 04/11/22  Yes Delsa Grana, PA-C  naloxone (NARCAN) nasal spray 4 mg/0.1 mL Place 1 spray into the nose as needed for up to 365 doses (for opioid-induced respiratory depresssion). In case of emergency (overdose), spray once into each nostril. If no response within 3 minutes, repeat application and call [phone]. 09/18/22 09/18/23 Yes Milinda Pointer, MD  Needles & Syringes MISC For administration of B12 injections 03/07/21  Yes Raulkar, Clide Deutscher, MD  ondansetron (ZOFRAN) 4 MG tablet Take 1 tablet (4 mg total) by mouth every 8 (eight) hours as needed. 06/27/22  Yes Raulkar, Clide Deutscher, MD  pantoprazole (PROTONIX) 40 MG tablet Take 1 tablet (40 mg total) by mouth 2 (two) times daily. 09/06/22  Yes Delsa Grana, PA-C  rizatriptan (MAXALT) 10 MG tablet Take 1 tablet (10 mg total) by mouth as needed for migraine. May repeat in 2 hours if needed 06/27/22  Yes Raulkar, Clide Deutscher, MD  scopolamine (TRANSDERM-SCOP) 1 MG/3DAYS Place 1 patch (1.5 mg total) onto the skin every 3 (three) days. 02/28/22  Yes Raulkar, Clide Deutscher, MD  VALERIAN PO Take by mouth as needed. Makes Valerian tea about 3-4 times per week.   Yes [provider]  baclofen (LIORESAL) 10 MG tablet Take 1 tablet (10 mg total) by mouth 2 (two) times daily. Each refill must last 30 days. 01/28/22 09/18/22  Milinda Pointer, MD  diclofenac Sodium  (VOLTAREN) 1 % GEL Apply 2 g topically 4 (four) times daily as needed. 03/01/20 12/13/21  Milinda Pointer, MD  dicyclomine (BENTYL) 10 MG capsule Take 1 capsule (10 mg total) by mouth 4 (four) times daily as needed (before meals and at bedtime) 01/17/21 12/13/21  Jonathon Bellows, MD  pregabalin (LYRICA) 150 MG capsule Take 1 capsule (150 mg total) by mouth every 8 (eight) hours. 09/28/21 09/18/22  Izora Ribas, MD    Allergies as of 09/25/2022 - Review Complete 09/25/2022  Allergen Reaction Noted   Aspirin Swelling 06/13/2008   Cephalexin Rash 12/10/2012   Cymbalta [duloxetine hcl] Other (See Comments) 09/09/2016   Depakote [divalproex sodium] Shortness Of Breath 12/10/2012   Gadolinium derivatives  07/20/2019   Haloperidol Shortness Of Breath 05/15/2015   Meperidine Nausea And Vomiting 12/10/2012   Metoclopramide Shortness Of Breath 09/16/2016   Morphine  02/20/2022   Penicillins Rash 06/13/2008   Prochlorperazine Other (See Comments) 09/16/2016   Tramadol  hcl Palpitations 09/09/2016   Trazodone Shortness Of Breath 01/23/2015   Meloxicam Other (See Comments) 03/17/2014   Neomycin-bacitracin zn-polymyx Rash 12/10/2012   Tomato Hives 04/29/2016   Other  03/17/2014   Shellfish allergy  03/17/2014   Shellfish-derived products Other (See Comments) 03/17/2014   Bacitra-neomycin-polymyxin-hc Rash 09/12/2022   Bacitracin-neomycin-polymyxin Rash 05/15/2015   Cephalosporins Rash 05/15/2015   Ibuprofen Other (See Comments) and Rash 01/05/2013   Latex Itching 03/17/2014   Nsaids Other (See Comments) 03/17/2014   Sulfa antibiotics Rash 03/17/2014   Sulfonamide derivatives Rash 06/13/2008    Family History  Problem Relation Age of Onset   Depression Mother    Hypertension Mother    Cancer Mother        Skin   Hyperlipidemia Mother    Anxiety disorder Mother    Migraines Mother    Alcohol abuse Father    Depression Father    Stroke Father    Heart disease Father    Hypertension  Father    Anxiety disorder Father    Depression Sister    Hyperlipidemia Sister    Diabetes Sister    Hypertension Sister    Polycystic ovary syndrome Sister    Bipolar disorder Sister    Anxiety disorder Sister    Migraines Sister    Depression Sister    Hypertension Sister    Anxiety disorder Sister    Migraines Sister    Breast cancer Maternal Grandmother 60   Cancer Maternal Grandmother 63       Breast   Thyroid disease Maternal Grandmother    Arthritis Maternal Grandmother    Hyperlipidemia Maternal Grandmother    Aneurysm Maternal Grandfather    Hypertension Maternal Grandfather    Heart disease Maternal Grandfather    Alzheimer's disease Paternal Grandmother    Heart attack Paternal Grandfather    Hypertension Paternal Grandfather    COPD Paternal Grandfather    Heart disease Paternal Grandfather    Migraines Daughter    Other Daughter        leomyoa scaroma   Migraines Daughter    Migraines Son    Alzheimer's disease Other    Bladder Cancer Neg Hx    Kidney cancer Neg Hx     Social History   Socioeconomic History   Marital status: Married    Spouse name: brian   Number of children: 3   Years of education: Not on file   Highest education level: Associate degree: academic program  Occupational History   Occupation: disbled    Comment: not able  Tobacco Use   Smoking status: Former    Packs/day: 2.00    Years: 3.00    Total pack years: 6.00    Types: Cigarettes    Quit date: 12/10/1992    Years since quitting: 29.8   Smokeless tobacco: Never   Tobacco comments:    quit 25 years ago  Vaping Use   Vaping Use: Never used  Substance and Sexual Activity   Alcohol use: No    Comment: socially   Drug use: No   Sexual activity: Not Currently    Partners: Male    Birth control/protection: Surgical  Other Topics Concern   Not on file  Social History Narrative   Lives at home with her husband and 2 of her children   Right handed   Caffeine: 0-2 cups  daily   Social Determinants of Health   Financial Resource Strain: High Risk (08/29/2017)   Overall Emergency planning/management officer Strain (  CARDIA)    Difficulty of Paying Living Expenses: Very hard  Food Insecurity: Food Insecurity Present (08/29/2017)   Hunger Vital Sign    Worried About Running Out of Food in the Last Year: Often true    Ran Out of Food in the Last Year: Often true  Transportation Needs: Unmet Transportation Needs (08/29/2017)   PRAPARE - Hydrologist (Medical): Yes    Lack of Transportation (Non-Medical): Yes  Physical Activity: Inactive (08/29/2017)   Exercise Vital Sign    Days of Exercise per Week: 0 days    Minutes of Exercise per Session: 0 min  Stress: Stress Concern Present (08/29/2017)   Price    Feeling of Stress : Rather much  Social Connections: Somewhat Isolated (08/29/2017)   Social Connection and Isolation Panel [NHANES]    Frequency of Communication with Friends and Family: Never    Frequency of Social Gatherings with Friends and Family: Never    Attends Religious Services: More than 4 times per year    Active Member of [employer] or Organizations: No    Attends Archivist Meetings: Never    Marital Status: Married  Human resources officer Violence: Not At Risk (12/16/2017)   Humiliation, Afraid, Rape, and Kick questionnaire    Fear of Current or Ex-Partner: No    Emotionally Abused: No    Physically Abused: No    Sexually Abused: No    Review of Systems: See HPI, otherwise negative ROS  Physical Exam: BP 124/77   Pulse 97   Temp 98.3 F (36.8 C) (Temporal)   Resp 18   Ht '5\' 3"'$  (1.6 m)   Wt 101.2 kg   LMP 10/22/2021 (Approximate)   SpO2 98%   BMI 39.50 kg/m  General:   Alert,  pleasant and cooperative in NAD Head:  Normocephalic and atraumatic. Neck:  Supple; no masses or thyromegaly. Lungs:  Clear throughout to auscultation, normal respiratory  effort.    Heart:  +S1, +S2, Regular rate and rhythm, No edema. Abdomen:  Soft, nontender and nondistended. Normal bowel sounds, without guarding, and without rebound.   Neurologic:  Alert and  oriented x4;  grossly normal neurologically.  Impression/Plan: Porcia Morganti is here for an endoscopy and colonoscopy  to be performed for  evaluation of abdominal pain and blood in stool.     Risks, benefits, limitations, and alternatives regarding endoscopy have been reviewed with the patient.  Questions have been answered.  All parties agreeable.   Jonathon Bellows, MD  09/26/2022, 10:14 AM

## 2022-09-26 NOTE — Anesthesia Preprocedure Evaluation (Signed)
Anesthesia Evaluation  Patient identified by MRN, date of birth, ID band Patient awake    Reviewed: Allergy & Precautions, NPO status , Patient's Chart, lab work & pertinent test results  History of Anesthesia Complications Negative for: history of anesthetic complications  Airway Mallampati: III  TM Distance: >3 FB Neck ROM: full    Dental no notable dental hx.    Pulmonary sleep apnea , former smoker   Pulmonary exam normal        Cardiovascular hypertension, + dysrhythmias Supra Ventricular Tachycardia      Neuro/Psych  Headaches PSYCHIATRIC DISORDERS Anxiety Depression     Neuromuscular disease    GI/Hepatic Neg liver ROS,GERD  ,,  Endo/Other  diabetes    Renal/GU negative Renal ROS  negative genitourinary   Musculoskeletal  (+) Arthritis , Osteoarthritis,  Fibromyalgia -, narcotic dependent  Abdominal   Peds  Hematology negative hematology ROS (+)   Anesthesia Other Findings Past Medical History: 04/07/2017: Acute postoperative pain No date: Anxiety No date: Bursitis 12/12/2017: Chronic fatigue No date: Chronic fatigue syndrome 2021: Colitis 05/02/2015: Edema leg No date: Fibromyalgia No date: GERD (gastroesophageal reflux disease) No date: IBS (irritable bowel syndrome) 12/21/2008: Knee pain, bilateral     Comment:  Qualifier: Diagnosis of  By: Hassell Done FNP, Tori Milks   No date: Lumbar discitis No date: Migraines No date: Osteoarthritis 04/10/2015: Right hand pain     Comment:  [facility] has done nerve conduction studies and              ruled out carpal tunnel.  No date: Sleep apnea No date: Spinal stenosis No date: SVT (supraventricular tachycardia) No date: Vertigo 05/01/2016: Vitamin D deficiency  Past Surgical History: No date: ABLATION     Comment:  Uterine No date: BREAST BIOPSY; Left     Comment:  2014 U/S bx fibroadenoma No date: CARDIAC CATHETERIZATION     Comment:  with  ablation 05/17/2015: COLONOSCOPY WITH PROPOFOL; N/A     Comment:  Procedure: COLONOSCOPY WITH PROPOFOL;  Surgeon: Manya Silvas, MD;  Location: [facility] ENDOSCOPY;  Service:               Endoscopy;  Laterality: N/A; 03/20/2020: COLONOSCOPY WITH PROPOFOL; N/A     Comment:  Procedure: COLONOSCOPY WITH PROPOFOL;  Surgeon: Jonathon Bellows, MD;  Location: [facility] ENDOSCOPY;  Service:               Gastroenterology;  Laterality: N/A; 09/25/2022: COLONOSCOPY WITH PROPOFOL; N/A     Comment:  Procedure: COLONOSCOPY WITH PROPOFOL;  Surgeon: Jonathon Bellows, MD;  Location: [facility] ENDOSCOPY;  Service:               Gastroenterology;  Laterality: N/A; 05/17/2015: ESOPHAGOGASTRODUODENOSCOPY; N/A     Comment:  Procedure: ESOPHAGOGASTRODUODENOSCOPY (EGD);  Surgeon:               Manya Silvas, MD;  Location: [facility] ENDOSCOPY;                Service: Endoscopy;  Laterality: N/A; 09/25/2022: ESOPHAGOGASTRODUODENOSCOPY; N/A     Comment:  Procedure: ESOPHAGOGASTRODUODENOSCOPY (EGD);  Surgeon:               Jonathon Bellows, MD;  Location: [facility] ENDOSCOPY;  Service:  Gastroenterology;  Laterality: N/A; No date: KNEE ARTHROSCOPY No date: spg     Comment:  6/18 1998: Tibial Tubercle Bypass; Right 10/01/1999: TUBAL LIGATION     Reproductive/Obstetrics negative OB ROS                             Anesthesia Physical Anesthesia Plan  ASA: 3  Anesthesia Plan: General   Post-op Pain Management: Minimal or no pain anticipated   Induction: Intravenous  PONV Risk Score and Plan: Propofol infusion and TIVA  Airway Management Planned: Natural Airway and Nasal Cannula  Additional Equipment:   Intra-op Plan:   Post-operative Plan:   Informed Consent: I have reviewed the patients History and Physical, chart, labs and discussed the procedure including the risks, benefits and alternatives for the proposed anesthesia with the patient or  authorized representative who has indicated his/her understanding and acceptance.     Dental Advisory Given  Plan Discussed with: Anesthesiologist, CRNA and Surgeon  Anesthesia Plan Comments: (Patient consented for risks of anesthesia including but not limited to:  - adverse reactions to medications - risk of airway placement if required - damage to eyes, teeth, lips or other oral mucosa - nerve damage due to positioning  - sore throat or hoarseness - Damage to heart, brain, nerves, lungs, other parts of body or loss of life  Patient voiced understanding.)       Anesthesia Quick Evaluation

## 2022-09-26 NOTE — Telephone Encounter (Signed)
Called patient again and she stated that she would want her procedure to be schedule for next week on 10/02/2022. I told her that it was fine and that I would be sending her new instructions via MyChart. I also recommended for her to take Miralax 17 grams daily and stool softener to help her bowels move since she takes narcotics. Patient agreed. I also recommended for her to do a 2 day clear liquid diet to make sure she was clean on the day of her procedure. Patient stated that she will do what I recommended to make sure that Dr. Vicente Males is able to do the procedure since she has a history of colon polyps. Patient had no further questions.

## 2022-09-26 NOTE — Transfer of Care (Signed)
Immediate Anesthesia Transfer of Care Note  Patient: Dana Bradley  Procedure(s) Performed: COLONOSCOPY ESOPHAGOGASTRODUODENOSCOPY (EGD)  Patient Location: Endoscopy Unit  Anesthesia Type:General  Level of Consciousness: drowsy  Airway & Oxygen Therapy: Patient Spontanous Breathing  Post-op Assessment: Report given to RN  Post vital signs: stable  Last Vitals:  Vitals Value Taken Time  BP 133/84 09/26/22 1055  Temp 36.8 C 09/26/22 1035  Pulse 85 09/26/22 1045  Resp 15 09/26/22 1045  SpO2 97 % 09/26/22 1045    Last Pain:  Vitals:   09/26/22 1035  TempSrc: Temporal  PainSc:          Complications: No notable events documented.

## 2022-09-26 NOTE — Op Note (Signed)
[facility] Gastroenterology Patient Name: Dana Bradley Procedure Date: 09/26/2022 10:18 AM MRN: [id number] Account #: [id number] Date of Birth: 09/18/1971 Admit Type: Outpatient Age: 51 Room: [facility] ENDO ROOM 3 Gender: Female Note Status: Finalized Instrument Name: Park Meo [id number] Procedure:             Colonoscopy Indications:           Hematochezia Providers:             Jonathon Bellows MD, MD Referring MD:          Delsa Grana (Referring MD) Medicines:             Monitored Anesthesia Care Complications:         No immediate complications. Procedure:             Pre-Anesthesia Assessment:                        - Prior to the procedure, a History and Physical was                         performed, and patient medications, allergies and                         sensitivities were reviewed. The patient's tolerance                         of previous anesthesia was reviewed.                        - The risks and benefits of the procedure and the                         sedation options and risks were discussed with the                         patient. All questions were answered and informed                         consent was obtained.                        - ASA Grade Assessment: II - A patient with mild                         systemic disease.                        After obtaining informed consent, the colonoscope was                         passed under direct vision. Throughout the procedure,                         the patient's blood pressure, pulse, and oxygen                         saturations were monitored continuously. The                         Colonoscope was introduced through the anus with the  intention of advancing to the cecum. The scope was                         advanced to the descending colon before the procedure                         was aborted. Medications were given. The colonoscopy                          was performed with ease. The patient tolerated the                         procedure well. The quality of the bowel preparation                         was unsatisfactory. Findings:      Skin tags were found on perianal exam.      A large amount of semi-solid stool was found in the rectum, in the       sigmoid colon and in the descending colon, interfering with       visualization. Impression:            - Preparation of the colon was unsatisfactory.                        - Perianal skin tags found on perianal exam.                        - Stool in the rectum, in the sigmoid colon and in the                         descending colon.                        - No specimens collected. Recommendation:        - Discharge patient to home (with escort).                        - Resume previous diet.                        - Continue present medications.                        - Repeat colonoscopy tomorrow because the bowel                         preparation was suboptimal. Procedure Code(s):     --- Professional ---                        [phone], 53, Colonoscopy, flexible; diagnostic,                         including collection of specimen(s) by brushing or                         washing, when performed (separate procedure) Diagnosis Code(s):     --- Professional ---  K64.4, Residual hemorrhoidal skin tags                        K92.1, Melena (includes Hematochezia) CPT copyright 2022 American Medical Association. All rights reserved. The codes documented in this report are preliminary and upon coder review may  be revised to meet current compliance requirements. Jonathon Bellows, MD Jonathon Bellows MD, MD 09/26/2022 10:32:36 AM This report has been signed electronically. Number of Addenda: 0 Note Initiated On: 09/26/2022 10:18 AM Total Procedure Duration: 0 hours 0 minutes 53 seconds  Estimated Blood Loss:  Estimated blood loss: none.      [facility]

## 2022-09-26 NOTE — Anesthesia Postprocedure Evaluation (Signed)
Anesthesia Post Note  Patient: Dana Bradley  Procedure(s) Performed: COLONOSCOPY ESOPHAGOGASTRODUODENOSCOPY (EGD)  Patient location during evaluation: Endoscopy Anesthesia Type: General Level of consciousness: awake and alert Pain management: pain level controlled Vital Signs Assessment: post-procedure vital signs reviewed and stable Respiratory status: spontaneous breathing, nonlabored ventilation, respiratory function stable and patient connected to nasal cannula oxygen Cardiovascular status: blood pressure returned to baseline and stable Postop Assessment: no apparent nausea or vomiting Anesthetic complications: no  No notable events documented.   Last Vitals:  Vitals:   09/26/22 1035 09/26/22 1045  BP: (!) 110/95 126/80  Pulse: 89 85  Resp: 10 15  Temp: 36.8 C   SpO2: 97% 97%    Last Pain:  Vitals:   09/26/22 1035  TempSrc: Temporal  PainSc:                  Ilene Qua

## 2022-09-26 NOTE — Op Note (Signed)
[facility] Gastroenterology Patient Name: Dana Bradley Procedure Date: 09/26/2022 10:19 AM MRN: [id number] Account #: [id number] Date of Birth: July 19, 1971 Admit Type: Outpatient Age: 51 Room: [facility] ENDO ROOM 3 Gender: Female Note Status: Finalized Instrument Name: Michaelle Birks [id number] Procedure:             Upper GI endoscopy Indications:           Dyspepsia Providers:             Jonathon Bellows MD, MD Referring MD:          Delsa Grana (Referring MD) Medicines:             Monitored Anesthesia Care Complications:         No immediate complications. Procedure:             Pre-Anesthesia Assessment:                        - Prior to the procedure, a History and Physical was                         performed, and patient medications, allergies and                         sensitivities were reviewed. The patient's tolerance                         of previous anesthesia was reviewed.                        - The risks and benefits of the procedure and the                         sedation options and risks were discussed with the                         patient. All questions were answered and informed                         consent was obtained.                        - ASA Grade Assessment: II - A patient with mild                         systemic disease.                        After obtaining informed consent, the endoscope was                         passed under direct vision. Throughout the procedure,                         the patient's blood pressure, pulse, and oxygen                         saturations were monitored continuously. The Endoscope                         was introduced through the mouth,  and advanced to the                         third part of duodenum. The upper GI endoscopy was                         accomplished with ease. The patient tolerated the                         procedure well. Findings:      The esophagus was normal.       The stomach was normal.      The examined duodenum was normal. Impression:            - Normal esophagus.                        - Normal stomach.                        - Normal examined duodenum.                        - No specimens collected. Recommendation:        - Perform a colonoscopy today. Procedure Code(s):     --- Professional ---                        [phone], Esophagogastroduodenoscopy, flexible,                         transoral; diagnostic, including collection of                         specimen(s) by brushing or washing, when performed                         (separate procedure) Diagnosis Code(s):     --- Professional ---                        R10.13, Epigastric pain CPT copyright 2022 American Medical Association. All rights reserved. The codes documented in this report are preliminary and upon coder review may  be revised to meet current compliance requirements. Jonathon Bellows, MD Jonathon Bellows MD, MD 09/26/2022 10:28:14 AM This report has been signed electronically. Number of Addenda: 0 Note Initiated On: 09/26/2022 10:19 AM Estimated Blood Loss:  Estimated blood loss: none.      [facility]

## 2022-09-26 NOTE — Telephone Encounter (Signed)
Called patient to let her know that I sent her prescription to her pharmacy-[place] and instructions were provided.

## 2022-09-27 ENCOUNTER — Encounter: Admission: RE | Payer: Self-pay | Source: Home / Self Care

## 2022-09-27 ENCOUNTER — Ambulatory Visit: Admission: RE | Admit: 2022-09-27 | Payer: Medicaid Other | Source: Home / Self Care | Admitting: Gastroenterology

## 2022-09-27 ENCOUNTER — Encounter: Payer: Medicaid Other | Admitting: Physical Medicine and Rehabilitation

## 2022-09-27 ENCOUNTER — Encounter: Payer: Self-pay | Admitting: Gastroenterology

## 2022-09-27 SURGERY — COLONOSCOPY
Anesthesia: General

## 2022-09-30 ENCOUNTER — Encounter: Payer: Self-pay | Admitting: Physical Medicine and Rehabilitation

## 2022-10-01 ENCOUNTER — Ambulatory Visit: Payer: Medicaid Other | Admitting: Family Medicine

## 2022-10-01 ENCOUNTER — Encounter: Payer: Self-pay | Admitting: Family Medicine

## 2022-10-01 ENCOUNTER — Ambulatory Visit: Payer: Medicaid Other | Admitting: Gastroenterology

## 2022-10-01 VITALS — BP 118/82 | HR 99 | Temp 98.3°F | Resp 16 | Ht 63.0 in | Wt 227.5 lb

## 2022-10-01 DIAGNOSIS — G8929 Other chronic pain: Secondary | ICD-10-CM | POA: Diagnosis not present

## 2022-10-01 DIAGNOSIS — G894 Chronic pain syndrome: Secondary | ICD-10-CM

## 2022-10-01 DIAGNOSIS — M62838 Other muscle spasm: Secondary | ICD-10-CM | POA: Diagnosis not present

## 2022-10-01 DIAGNOSIS — M25562 Pain in left knee: Secondary | ICD-10-CM

## 2022-10-01 DIAGNOSIS — Z79899 Other long term (current) drug therapy: Secondary | ICD-10-CM

## 2022-10-01 DIAGNOSIS — M5442 Lumbago with sciatica, left side: Secondary | ICD-10-CM | POA: Diagnosis not present

## 2022-10-01 DIAGNOSIS — M5441 Lumbago with sciatica, right side: Secondary | ICD-10-CM

## 2022-10-01 DIAGNOSIS — I10 Essential (primary) hypertension: Secondary | ICD-10-CM | POA: Diagnosis not present

## 2022-10-01 MED ORDER — METOPROLOL TARTRATE 50 MG PO TABS: ORAL_TABLET | ORAL | 3 refills | Status: DC

## 2022-10-01 MED ORDER — BACLOFEN 10 MG PO TABS: 5.0000 mg | ORAL_TABLET | Freq: Three times a day (TID) | ORAL | 1 refills | Status: AC

## 2022-10-01 NOTE — Progress Notes (Signed)
Patient ID: Dana Bradley, female    DOB: Apr 15, 1971, 52 y.o.   MRN: [id number]  PCP: Delsa Grana, PA-C  Chief Complaint  Patient presents with   Knee Pain    Left knee consistent pain mainly during movement    Subjective:   Dana Bradley is a 52 y.o. female, presents to clinic with CC of the following:  3-4 weeks much worse, on and off previously for months/years  Knee Pain  There was no injury mechanism. The pain is present in the left knee. The quality of the pain is described as aching and stabbing. The pain is severe. The pain has been Worsening (constant and gradually worsening) since onset. Associated symptoms include a loss of motion (limited flexion cannot go past 90 degrees). Pertinent negatives include no inability to bear weight, loss of sensation, muscle weakness, numbness or tingling. She reports no foreign bodies present. The symptoms are aggravated by weight bearing, movement and palpation. She has tried ice, heat and rest for the symptoms. The treatment provided no relief.    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[place]) 04/04/2022   Neurogenic bladder 01/28/2022   Urinary and fecal incontinence 01/28/2022   New onset type 2 diabetes mellitus ([place]) 01/02/2022   OSA (obstructive sleep apnea) 12/14/2021   Excessive daytime sleepiness 12/14/2021   Lateral epicondylitis, left elbow 07/19/2021   Abnormal MRI, cervical spine (02/14/2021) 02/28/2021   Cervicalgia 02/13/2021   Abnormal MRI, lumbar spine (12/27/2021) 11/09/2020   Chronic midline thoracic back pain 11/09/2020   Abnormal MRI, thoracic spine (08/02/2019) 11/09/2020   Thoracic spinal stenosis (T7-8) 11/09/2020   Prolapse of thoracic disc with radiculopathy (T7-8) 11/09/2020   Spasm of muscle of lower back 11/09/2020   Vertigo, benign paroxysmal, unspecified laterality 95/05/3266    Uncomplicated opioid dependence ([place]) 11/08/2020   Hyperalgesia 03/07/2020   Neurogenic urinary incontinence 03/01/2020   Other intervertebral disc degeneration, lumbar region 03/01/2020   Spinal stenosis of lumbosacral region 03/01/2020   Pharmacologic therapy 02/06/2020   Lumbar radiculitis (Right) 09/21/2019   Chronic lower extremity pain (Bilateral) 09/06/2019   Intractable migraine with aura without status migrainosus 08/10/2019   Other specified dorsopathies, sacral and sacrococcygeal region 08/03/2019   Latex precautions, history of latex allergy 08/03/2019   History of allergy to radiographic contrast media 08/03/2019   DDD (degenerative disc disease), cervical 07/21/2019   Cervical facet syndrome (Bilateral) (L>R) 07/21/2019   DDD (degenerative disc disease), thoracic 07/21/2019   Osteoarthritis of hip (Left) 07/21/2019   Chronic groin pain (Bilateral) (L>R) 07/21/2019   Chronic hip pain (Bilateral) (L>R) 07/21/2019   Somatic dysfunction of sacroiliac joint (Bilateral) 07/21/2019   Migraine with aura and with status migrainosus, not intractable 04/06/2019   Cervico-occipital neuralgia (Left) 04/06/2019   Weakness of leg (Left) 04/05/2019   Difficulty walking 04/05/2019   Chronic migraine without aura, with intractable migraine, so stated, with status migrainosus 12/27/2018   Malar rash 09/04/2018   Trigger point of neck (Left) 03/19/2018   Occipital headache 12/25/2017   Chronic fatigue syndrome with fibromyalgia 12/12/2017   Trigger point of shoulder region (Left) 11/17/2017   Myofascial pain syndrome (Left) (trapezius muscle) 07/22/2017   Lumbar L1-2 disc protrusion (Right) 04/07/2017   Muscle spasticity 04/01/2017   Osteoarthritis of shoulder (Bilateral) 04/01/2017   Lumbar spondylosis 01/06/2017   Chronic hip pain (Left)  12/24/2016   Chronic sacroiliac joint pain (Left) 12/24/2016   Lumbar facet syndrome (Bilateral) (L>R) 12/24/2016   Lumbar radiculitis (Left)  12/24/2016   Hypertriglyceridemia 11/27/2016   History of vasovagal episode 10/30/2016   Cervicogenic headache 09/09/2016   Medication monitoring encounter 08/29/2016   Controlled substance agreement signed 08/28/2016   Plantar fasciitis of left foot 08/28/2016   Vitamin B12 deficiency 08/28/2016   Hyperlipidemia 08/28/2016   Nephrolithiasis 08/12/2016   Chronic pain syndrome 08/07/2016   Long term prescription opiate use 08/07/2016   Opiate use 08/07/2016   Long term prescription benzodiazepine use 08/07/2016   Neurogenic pain 08/07/2016   Chronic low back pain (1ry area of Pain) (Bilateral) (R>L) (midline) 08/07/2016   Chronic upper back pain (2ry area of Pain) (Bilateral) (L>R) 08/07/2016   Chronic abdominal pain (Right lower quadrant) 08/07/2016   Thoracic radiculitis (Bilateral: T10, T11) 08/07/2016   Chronic occipital neuralgia (3ry area of Pain) (Bilateral) (L>R) 08/07/2016   Chronic neck pain 08/07/2016   Chronic cervical radicular pain (Bilateral) (L>R) 08/07/2016   Chronic shoulder blade pain (Bilateral) (L>R) 08/07/2016   Chronic upper extremity pain (Bilateral) (R>L) 08/07/2016   Chronic knee pain (Bilateral) (R>L) 08/07/2016   Chronic ankle pain (Bilateral) 08/07/2016   Cervical spondylosis with myelopathy and radiculopathy 08/07/2016   Panic disorder with agoraphobia 05/29/2016   Depression, unspecified depression type 05/29/2016   Atypical lymphocytosis 05/01/2016   Vitamin D insufficiency 05/01/2016   Chronic lower extremity cramps (Bilateral) (R>L) 04/29/2016   Class 3 severe obesity with serious comorbidity and body mass index (BMI) of 45.0 to 49.9 in adult ([place]) 04/29/2016   GAD (generalized anxiety disorder) 04/29/2016   Fatigue 04/29/2016   Insomnia 07/12/2015   Migraine without aura and with status migrainosus, not intractable 07/12/2015   Chronic superficial gastritis 06/02/2015   Chronic pain of multiple joints 05/15/2015   Bilateral leg edema 05/02/2015    Paroxysmal supraventricular tachycardia ([place]) 04/17/2015   Exertional shortness of breath 04/17/2015   Bright red rectal bleeding 04/06/2015   DDD (degenerative disc disease), lumbosacral 01/24/2014   DDD (degenerative disc disease), lumbar 01/24/2014   Cervico-occipital neuralgia 12/29/2013   Fibromyalgia 12/29/2013   Migraine headache 12/29/2013   Menorrhagia 12/10/2012   Depression, major, recurrent, in remission ([place]) 01/12/2009   Chest pain 01/12/2009   Hypertension, benign essential, goal below 140/90 06/23/2008   History of PSVT (paroxysmal supraventricular tachycardia) 06/17/2008   Obstructive sleep apnea 06/17/2008   GERD 06/13/2008      Current Outpatient Medications:    amitriptyline (ELAVIL) 75 MG tablet, Take 1 tablet (75 mg total) by mouth at bedtime., Disp: 90 tablet, Rfl: 3   atorvastatin (LIPITOR) 40 MG tablet, TAKE 1 TABLET (40 MG TOTAL) BY MOUTH AT BEDTIME., Disp: 90 tablet, Rfl: 3   buPROPion ER (WELLBUTRIN SR) 100 MG 12 hr tablet, TAKE 1 TABLET BY MOUTH 2 TIMES DAILY, Disp: 60 tablet, Rfl: 2   butalbital-acetaminophen-caffeine (BAC) 50-325-40 MG tablet, Take 1 tablet by mouth every 4 (four) hours as needed for migraine., Disp: 60 tablet, Rfl: 3   cyanocobalamin (,VITAMIN B-12,) 1000 MCG/ML injection, INJECT 1 ML (1,000 MCG) INTO THE MUSCLE ONCE FOR 1 DOSE, Disp: 1 mL, Rfl: 3   Eptinezumab-jjmr (VYEPTI) 100 MG/ML injection, Inject 1 mL (100 mg total) into the vein every 3 (three) months., Disp: 1 mL, Rfl: 3   HYDROcodone-acetaminophen (NORCO) 7.5-325 MG tablet, Take 1 tablet by mouth 2 (two) times daily as needed for severe pain. Must last 30 days, Disp: 25  tablet, Rfl: 0   [START ON 10/18/2022] HYDROcodone-acetaminophen (NORCO) 7.5-325 MG tablet, Take 1 tablet by mouth 2 (two) times daily as needed for severe pain. Must last 30 days, Disp: 25 tablet, Rfl: 0   [START ON 11/17/2022] HYDROcodone-acetaminophen (NORCO) 7.5-325 MG tablet, Take 1 tablet by mouth 2 (two)  times daily as needed for severe pain. Must last 30 days, Disp: 25 tablet, Rfl: 0   lipase/protease/amylase (CREON) 12000-38000 units CPEP capsule, Take 2 capsules by mouth with the first bite of each meal and take 1 capsule before snacks. (Max of 8 capsules per day)., Disp: 270 capsule, Rfl: 1   meclizine (ANTIVERT) 12.5 MG tablet, Take 1 tablet (12.5 mg total) by mouth 3 (three) times daily as needed for dizziness., Disp: 30 tablet, Rfl: 0   medroxyPROGESTERone (PROVERA) 5 MG tablet, TAKE 1 TABLET BY MOUTH DAILY, Disp: 90 tablet, Rfl: 1   metFORMIN (GLUCOPHAGE) 1000 MG tablet, Take 1 tablet (1,000 mg total) by mouth 2 (two) times daily with a meal., Disp: 180 tablet, Rfl: 1   metoprolol tartrate (LOPRESSOR) 50 MG tablet, TAKE 1 AND 1/2 TABLET BY MOUTH 2 TIMES DAILY, Disp: 135 tablet, Rfl: 3   naloxone (NARCAN) nasal spray 4 mg/0.1 mL, Place 1 spray into the nose as needed for up to 365 doses (for opioid-induced respiratory depresssion). In case of emergency (overdose), spray once into each nostril. If no response within 3 minutes, repeat application and call [phone]., Disp: 1 each, Rfl: 0   Needles & Syringes MISC, For administration of B12 injections, Disp: 30 each, Rfl: 0   ondansetron (ZOFRAN) 4 MG tablet, Take 1 tablet (4 mg total) by mouth every 8 (eight) hours as needed., Disp: 20 tablet, Rfl: 2   pantoprazole (PROTONIX) 40 MG tablet, Take 1 tablet (40 mg total) by mouth 2 (two) times daily., Disp: 60 tablet, Rfl: 1   rizatriptan (MAXALT) 10 MG tablet, Take 1 tablet (10 mg total) by mouth as needed for migraine. May repeat in 2 hours if needed, Disp: 10 tablet, Rfl: 3   scopolamine (TRANSDERM-SCOP) 1 MG/3DAYS, Place 1 patch (1.5 mg total) onto the skin every 3 (three) days., Disp: 10 patch, Rfl: 12   VALERIAN PO, Take by mouth as needed. Makes Valerian tea about 3-4 times per week., Disp: , Rfl:    baclofen (LIORESAL) 10 MG tablet, Take 1 tablet (10 mg total) by mouth 2 (two) times daily. Each  refill must last 30 days., Disp: 60 tablet, Rfl: 2   diclofenac Sodium (VOLTAREN) 1 % GEL, Apply 2 g topically 4 (four) times daily as needed., Disp: 350 g, Rfl: PRN   dicyclomine (BENTYL) 10 MG capsule, Take 1 capsule (10 mg total) by mouth 4 (four) times daily as needed (before meals and at bedtime), Disp: 360 capsule, Rfl: 1   polyethylene glycol-electrolytes (NULYTELY) 420 g solution, Prepare according to package instructions. Starting at 5:00 PM: Drink one 8 oz glass of mixture every 15 minutes until you finish half of the jug. Five hours prior to procedure, drink 8 oz glass of mixture every 15 minutes until it is all gone. Make sure you do not drink anything 4 hours prior to your procedure. (Patient not taking: Reported on 10/01/2022), Disp: 4000 mL, Rfl: 0   pregabalin (LYRICA) 150 MG capsule, Take 1 capsule (150 mg total) by mouth every 8 (eight) hours., Disp: 270 capsule, Rfl: 3   Allergies  Allergen Reactions   Aspirin Swelling   Cephalexin Rash   Cymbalta [  Duloxetine Hcl] Other (See Comments)    Suicidal ideations and has homicidal thoughts per patient   Depakote [Divalproex Sodium] Shortness Of Breath    W/ n/v   Gadolinium Derivatives     Pt was unable to breath Other reaction(s): Other (See Comments) Pt was unable to breath   Haloperidol Shortness Of Breath    W/ n/v   Meperidine Nausea And Vomiting    Patient projectile vomits and usually result in ER Other reaction(s): Vomiting   Metoclopramide Shortness Of Breath    Can't breath, wheezes Other reaction(s): Other (See Comments)   Morphine     Other reaction(s): Vomiting   Penicillins Rash    Other reaction(s): Unknown   Prochlorperazine Other (See Comments)    Panic attack Other reaction(s): Other (See Comments)   Tramadol Hcl Palpitations    Severely and adversely affects her SVT giving her tachycardias of 150-160 bpm.   Trazodone Shortness Of Breath    Other reaction(s): Other (See Comments)   Meloxicam Other  (See Comments)    mouth sores, tingling, blisters in mouth Other reaction(s): Other (See Comments) mout sores, tingling mouth sores, tingling, blisters in mouth   Neomycin-Bacitracin Zn-Polymyx Rash   Tomato Hives    Tongue will blister   Other     Other reaction(s): Other (See Comments)   Shellfish Allergy     Other reaction(s): Unknown   Shellfish-Derived Products Other (See Comments)    Other reaction(s): Unknown Other reaction(s): Unknown   Bacitra-Neomycin-Polymyxin-Hc Rash   Bacitracin-Neomycin-Polymyxin Rash   Cephalosporins Rash    rash   Ibuprofen Other (See Comments) and Rash    Blisters in mouth. Blisters in mouth.   Latex Itching    Other reaction(s): Unknown   Nsaids Other (See Comments)    Blisters in mouth; can take 1 ibuprofen 2x a month Other reaction(s): Other (See Comments), Unknown Blisters in mouth; can take 1 ibuprofen 2x a month   Sulfa Antibiotics Rash    rash Other reaction(s): Unknown rash  Other reaction(s): Unknown rash rash Other reaction(s): Unknown rash   Sulfonamide Derivatives Rash     Social History   Tobacco Use   Smoking status: Former    Packs/day: 2.00    Years: 3.00    Total pack years: 6.00    Types: Cigarettes    Quit date: 12/10/1992    Years since quitting: 29.8   Smokeless tobacco: Never   Tobacco comments:    quit 25 years ago  Vaping Use   Vaping Use: Never used  Substance Use Topics   Alcohol use: No    Comment: socially   Drug use: No      Chart Review Today: I personally reviewed active problem list, medication list, allergies, family history, social history, health maintenance, notes from last encounter, lab results, imaging with the patient/caregiver today.   Review of Systems  Constitutional: Negative.   HENT: Negative.    Eyes: Negative.   Respiratory: Negative.    Cardiovascular: Negative.   Gastrointestinal: Negative.   Endocrine: Negative.   Genitourinary: Negative.   Musculoskeletal:  Negative.   Skin: Negative.   Allergic/Immunologic: Negative.   Neurological: Negative.  Negative for tingling and numbness.  Hematological: Negative.   Psychiatric/Behavioral: Negative.    All other systems reviewed and are negative.      Objective:   Vitals:   10/01/22 1121  BP: 118/82  Pulse: 99  Resp: 16  Temp: 98.3 F (36.8 C)  TempSrc: Oral  SpO2: 97%  Weight: 227 lb 8 oz (103.2 kg)  Height: '5\' 3"'$  (1.6 m)    Body mass index is 40.3 kg/m.  Physical Exam Vitals and nursing note reviewed.  Constitutional:      Appearance: She is well-developed. She is obese.  HENT:     Head: Normocephalic and atraumatic.     Nose: Nose normal.  Eyes:     General:        Right eye: No discharge.        Left eye: No discharge.     Conjunctiva/sclera: Conjunctivae normal.  Neck:     Trachea: No tracheal deviation.  Cardiovascular:     Rate and Rhythm: Normal rate and regular rhythm.  Pulmonary:     Effort: Pulmonary effort is normal. No respiratory distress.     Breath sounds: No stridor.  Musculoskeletal:     Left knee: No swelling, deformity, effusion or crepitus. Decreased range of motion (limited flexion, normal extension). Tenderness present over the medial joint line and patellar tendon (and to tibial tuberosity). Normal pulse.     Instability Tests: Anterior drawer test negative.  Skin:    General: Skin is warm and dry.     Findings: No rash.  Neurological:     Mental Status: She is alert.     Motor: No abnormal muscle tone.     Coordination: Coordination normal.     Gait: Gait abnormal.  Psychiatric:        Behavior: Behavior normal.      Results for orders placed or performed during the hospital encounter of 09/26/22  Glucose, capillary  Result Value Ref Range   Glucose-Capillary 180 (H) 70 - 99 mg/dL   *Note: Due to a large number of results and/or encounters for the requested time period, some results have not been displayed. A complete set of results can  be found in Results Review.       Assessment & Plan:     ICD-10-CM   1. Acute pain of left knee  M25.562 Ambulatory referral to Orthopedic Surgery   acute x 3-4 weeks, hx of months off and on, ttp to patella and tibial tuberosity, limited flexion, topic tx, rest, ice, refer to ortho, defer imaging to ortho      Med refills for dx and meds below:  2. Muscle spasticity  M62.838 baclofen (LIORESAL) 10 MG tablet    3. Chronic pain syndrome  G89.4 baclofen (LIORESAL) 10 MG tablet    4. Pharmacologic therapy  Z79.899 baclofen (LIORESAL) 10 MG tablet    5. Encounter for medication management  Z79.899 baclofen (LIORESAL) 10 MG tablet    6. Chronic low back pain (1ry area of Pain) (Bilateral) (R>L) (midline)  M54.42 baclofen (LIORESAL) 10 MG tablet   M54.41    G89.29     7. Hypertension, benign essential, goal below 140/90  I10 metoprolol tartrate (LOPRESSOR) 50 MG tablet          Delsa Grana, PA-C 10/01/22 11:41 AM

## 2022-10-02 ENCOUNTER — Encounter
Admission: RE | Disposition: A | Discharge status: Home / Self Care | Payer: Self-pay | Source: Home / Self Care | Attending: Gastroenterology

## 2022-10-02 ENCOUNTER — Ambulatory Visit: Payer: Medicaid Other | Admitting: Anesthesiology

## 2022-10-02 ENCOUNTER — Ambulatory Visit
Admission: RE | Admit: 2022-10-02 | Discharge: 2022-10-02 | Disposition: A | Discharge status: Home / Self Care | Payer: Medicaid Other | Attending: Gastroenterology | Admitting: Gastroenterology

## 2022-10-02 DIAGNOSIS — K219 Gastro-esophageal reflux disease without esophagitis: Secondary | ICD-10-CM | POA: Insufficient documentation

## 2022-10-02 DIAGNOSIS — K589 Irritable bowel syndrome without diarrhea: Secondary | ICD-10-CM | POA: Insufficient documentation

## 2022-10-02 DIAGNOSIS — Z86018 Personal history of other benign neoplasm: Secondary | ICD-10-CM | POA: Insufficient documentation

## 2022-10-02 DIAGNOSIS — Z5941 Food insecurity: Secondary | ICD-10-CM | POA: Insufficient documentation

## 2022-10-02 DIAGNOSIS — Z6841 Body Mass Index (BMI) 40.0 and over, adult: Secondary | ICD-10-CM | POA: Diagnosis not present

## 2022-10-02 DIAGNOSIS — Z5986 Financial insecurity: Secondary | ICD-10-CM | POA: Diagnosis not present

## 2022-10-02 DIAGNOSIS — Z7984 Long term (current) use of oral hypoglycemic drugs: Secondary | ICD-10-CM | POA: Diagnosis not present

## 2022-10-02 DIAGNOSIS — K625 Hemorrhage of anus and rectum: Secondary | ICD-10-CM | POA: Diagnosis not present

## 2022-10-02 DIAGNOSIS — Z1211 Encounter for screening for malignant neoplasm of colon: Secondary | ICD-10-CM

## 2022-10-02 DIAGNOSIS — I10 Essential (primary) hypertension: Secondary | ICD-10-CM | POA: Diagnosis not present

## 2022-10-02 DIAGNOSIS — F418 Other specified anxiety disorders: Secondary | ICD-10-CM | POA: Diagnosis not present

## 2022-10-02 DIAGNOSIS — K5909 Other constipation: Secondary | ICD-10-CM

## 2022-10-02 DIAGNOSIS — Z87891 Personal history of nicotine dependence: Secondary | ICD-10-CM | POA: Insufficient documentation

## 2022-10-02 DIAGNOSIS — E119 Type 2 diabetes mellitus without complications: Secondary | ICD-10-CM | POA: Insufficient documentation

## 2022-10-02 DIAGNOSIS — R935 Abnormal findings on diagnostic imaging of other abdominal regions, including retroperitoneum: Secondary | ICD-10-CM

## 2022-10-02 DIAGNOSIS — G4730 Sleep apnea, unspecified: Secondary | ICD-10-CM | POA: Insufficient documentation

## 2022-10-02 HISTORY — PX: COLONOSCOPY WITH PROPOFOL: SHX5780

## 2022-10-02 LAB — GLUCOSE, CAPILLARY: Glucose-Capillary: 165 mg/dL — ABNORMAL HIGH (ref 70–99)

## 2022-10-02 SURGERY — COLONOSCOPY WITH PROPOFOL
Anesthesia: General

## 2022-10-02 MED ORDER — SODIUM CHLORIDE 0.9 % IV SOLN: INTRAVENOUS | Status: DC

## 2022-10-02 MED ORDER — LIDOCAINE HCL (CARDIAC) PF 100 MG/5ML IV SOSY
PREFILLED_SYRINGE | INTRAVENOUS | Status: DC | PRN
  Administered 2022-10-02: 60 mg via INTRAVENOUS

## 2022-10-02 MED ORDER — PROPOFOL 500 MG/50ML IV EMUL
INTRAVENOUS | Status: DC | PRN
  Administered 2022-10-02: 140 ug/kg/min via INTRAVENOUS

## 2022-10-02 MED ORDER — PROPOFOL 10 MG/ML IV BOLUS
INTRAVENOUS | Status: DC | PRN
  Administered 2022-10-02: 70 mg via INTRAVENOUS

## 2022-10-02 NOTE — Transfer of Care (Signed)
Immediate Anesthesia Transfer of Care Note  Patient: Dana Bradley  Procedure(s) Performed: COLONOSCOPY WITH PROPOFOL  Patient Location: PACU  Anesthesia Type:General  Level of Consciousness: awake, alert , and oriented  Airway & Oxygen Therapy: Patient Spontanous Breathing  Post-op Assessment: Report given to RN and Post -op Vital signs reviewed and stable  Post vital signs: Reviewed and stable  Last Vitals:  Vitals Value Taken Time  BP 137/87 10/02/22 1052  Temp    Pulse 95 10/02/22 1052  Resp 19 10/02/22 1052  SpO2 98 % 10/02/22 1052  Vitals shown include unvalidated device data.  Last Pain:  Vitals:   10/02/22 1010  TempSrc: Temporal  PainSc: 3          Complications: No notable events documented.

## 2022-10-02 NOTE — H&P (Signed)
Jonathon Bellows, MD [address], [place], [place], [place], [address] [address], [place], [place], [place], [address] Phone: [phone]  Fax: [phone]  Primary Care Physician:  Delsa Grana, PA-C   Pre-Procedure History & Physical: HPI:  Nary Sneed is a 52 y.o. female is here for an colonoscopy.   Past Medical History:  Diagnosis Date   Acute postoperative pain 04/07/2017   Anxiety    Bursitis    Chronic fatigue 12/12/2017   Chronic fatigue syndrome    Colitis 2021   Diabetes mellitus without complication ([place])    Edema leg 05/02/2015   Fibromyalgia    GERD (gastroesophageal reflux disease)    IBS (irritable bowel syndrome)    Knee pain, bilateral 12/21/2008   Qualifier: Diagnosis of  By: Hassell Done FNP, [place]     Lumbar discitis    Migraines    Osteoarthritis    Right hand pain 04/10/2015   [facility] Neurology has done nerve conduction studies and ruled out carpal tunnel.    Sleep apnea    Spinal stenosis    SVT (supraventricular tachycardia)    Vertigo    Vitamin D deficiency 05/01/2016    Past Surgical History:  Procedure Laterality Date   ABLATION     Uterine   BREAST BIOPSY Left    2014 U/S bx fibroadenoma   CARDIAC CATHETERIZATION     with ablation   COLONOSCOPY N/A 09/26/2022   Procedure: COLONOSCOPY;  Surgeon: Jonathon Bellows, MD;  Location: [facility] ENDOSCOPY;  Service: Gastroenterology;  Laterality: N/A;   COLONOSCOPY WITH PROPOFOL N/A 05/17/2015   Procedure: COLONOSCOPY WITH PROPOFOL;  Surgeon: Manya Silvas, MD;  Location: [facility] ENDOSCOPY;  Service: Endoscopy;  Laterality: N/A;   COLONOSCOPY WITH PROPOFOL N/A 03/20/2020   Procedure: COLONOSCOPY WITH PROPOFOL;  Surgeon: Jonathon Bellows, MD;  Location: [facility] ENDOSCOPY;  Service: Gastroenterology;  Laterality: N/A;   COLONOSCOPY WITH PROPOFOL N/A 09/25/2022   Procedure: COLONOSCOPY WITH PROPOFOL;  Surgeon: Jonathon Bellows, MD;  Location: [facility] ENDOSCOPY;  Service: Gastroenterology;  Laterality: N/A;    ESOPHAGOGASTRODUODENOSCOPY N/A 05/17/2015   Procedure: ESOPHAGOGASTRODUODENOSCOPY (EGD);  Surgeon: Manya Silvas, MD;  Location: [facility] ENDOSCOPY;  Service: Endoscopy;  Laterality: N/A;   ESOPHAGOGASTRODUODENOSCOPY N/A 09/25/2022   Procedure: ESOPHAGOGASTRODUODENOSCOPY (EGD);  Surgeon: Jonathon Bellows, MD;  Location: [facility] ENDOSCOPY;  Service: Gastroenterology;  Laterality: N/A;   ESOPHAGOGASTRODUODENOSCOPY N/A 09/26/2022   Procedure: ESOPHAGOGASTRODUODENOSCOPY (EGD);  Surgeon: Jonathon Bellows, MD;  Location: [facility] ENDOSCOPY;  Service: Gastroenterology;  Laterality: N/A;   KNEE ARTHROSCOPY     spg     6/18   Tibial Tubercle Bypass Right 1998   TUBAL LIGATION  10/01/1999    Prior to Admission medications   Medication Sig Start Date End Date Taking? Authorizing Provider  atorvastatin (LIPITOR) 40 MG tablet TAKE 1 TABLET (40 MG TOTAL) BY MOUTH AT BEDTIME. 02/26/22 02/26/23 Yes Delsa Grana, PA-C  buPROPion ER (WELLBUTRIN SR) 100 MG 12 hr tablet TAKE 1 TABLET BY MOUTH 2 TIMES DAILY 09/09/22  Yes Raulkar, Clide Deutscher, MD  metFORMIN (GLUCOPHAGE) 1000 MG tablet Take 1 tablet (1,000 mg total) by mouth 2 (two) times daily with a meal. 06/12/22  Yes Delsa Grana, PA-C  metoprolol tartrate (LOPRESSOR) 50 MG tablet TAKE 1 AND 1/2 TABLET BY MOUTH 2 TIMES DAILY 10/01/22  Yes Delsa Grana, PA-C  pregabalin (LYRICA) 150 MG capsule Take 1 capsule (150 mg total) by mouth every 8 (eight) hours. 09/28/21 10/02/22 Yes Raulkar, Clide Deutscher, MD  amitriptyline (ELAVIL) 75 MG  tablet Take 1 tablet (75 mg total) by mouth at bedtime. 03/28/22   Raulkar, Clide Deutscher, MD  baclofen (LIORESAL) 10 MG tablet Take 0.5-1 tablets (5-10 mg total) by mouth 3 (three) times daily. Each refill must last 30 days. 10/01/22   Delsa Grana, PA-C  butalbital-acetaminophen-caffeine (BAC) 50-325-40 MG tablet Take 1 tablet by mouth every 4 (four) hours as needed for migraine. 06/07/22   Raulkar, Clide Deutscher, MD  cyanocobalamin (,VITAMIN B-12,) 1000 MCG/ML injection  INJECT 1 ML (1,000 MCG) INTO THE MUSCLE ONCE FOR 1 DOSE 03/26/22   Raulkar, Clide Deutscher, MD  diclofenac Sodium (VOLTAREN) 1 % GEL Apply 2 g topically 4 (four) times daily as needed. 03/01/20 12/13/21  Milinda Pointer, MD  dicyclomine (BENTYL) 10 MG capsule Take 1 capsule (10 mg total) by mouth 4 (four) times daily as needed (before meals and at bedtime) 01/17/21 12/13/21  Jonathon Bellows, MD  Eptinezumab-jjmr (VYEPTI) 100 MG/ML injection Inject 1 mL (100 mg total) into the vein every 3 (three) months. 03/01/22   Raulkar, Clide Deutscher, MD  HYDROcodone-acetaminophen (NORCO) 7.5-325 MG tablet Take 1 tablet by mouth 2 (two) times daily as needed for severe pain. Must last 30 days 09/18/22 10/18/22  Milinda Pointer, MD  HYDROcodone-acetaminophen [facility]) 7.5-325 MG tablet Take 1 tablet by mouth 2 (two) times daily as needed for severe pain. Must last 30 days 10/18/22 11/17/22  Milinda Pointer, MD  HYDROcodone-acetaminophen [facility]) 7.5-325 MG tablet Take 1 tablet by mouth 2 (two) times daily as needed for severe pain. Must last 30 days 11/17/22 12/17/22  Milinda Pointer, MD  lipase/protease/amylase (CREON) 12000-38000 units CPEP capsule Take 2 capsules by mouth with the first bite of each meal and take 1 capsule before snacks. (Max of 8 capsules per day). 09/19/21   Jonathon Bellows, MD  meclizine (ANTIVERT) 12.5 MG tablet Take 1 tablet (12.5 mg total) by mouth 3 (three) times daily as needed for dizziness. 09/28/21   Izora Ribas, MD  medroxyPROGESTERone (PROVERA) 5 MG tablet TAKE 1 TABLET BY MOUTH DAILY 05/17/22   Harlin Heys, MD  naloxone [facility]) nasal spray 4 mg/0.1 mL Place 1 spray into the nose as needed for up to 365 doses (for opioid-induced respiratory depresssion). In case of emergency (overdose), spray once into each nostril. If no response within 3 minutes, repeat application and call [phone]. 09/18/22 09/18/23  Milinda Pointer, MD  Needles & Syringes MISC For administration of B12 injections 03/07/21    Raulkar, Clide Deutscher, MD  ondansetron (ZOFRAN) 4 MG tablet Take 1 tablet (4 mg total) by mouth every 8 (eight) hours as needed. 06/27/22   Raulkar, Clide Deutscher, MD  pantoprazole (PROTONIX) 40 MG tablet Take 1 tablet (40 mg total) by mouth 2 (two) times daily. 09/06/22   Delsa Grana, PA-C  polyethylene glycol-electrolytes (NULYTELY) 420 g solution Prepare according to package instructions. Starting at 5:00 PM: Drink one 8 oz glass of mixture every 15 minutes until you finish half of the jug. Five hours prior to procedure, drink 8 oz glass of mixture every 15 minutes until it is all gone. Make sure you do not drink anything 4 hours prior to your procedure. Patient not taking: Reported on 10/01/2022 09/26/22   Jonathon Bellows, MD  rizatriptan (MAXALT) 10 MG tablet Take 1 tablet (10 mg total) by mouth as needed for migraine. May repeat in 2 hours if needed 06/27/22   Raulkar, Clide Deutscher, MD  scopolamine (TRANSDERM-SCOP) 1 MG/3DAYS Place 1 patch (1.5 mg total) onto the skin  every 3 (three) days. 02/28/22   Raulkar, Clide Deutscher, MD  VALERIAN PO Take by mouth as needed. Makes Valerian tea about 3-4 times per week.    [provider]    Allergies as of 09/27/2022 - Review Complete 09/26/2022  Allergen Reaction Noted   Aspirin Swelling 06/13/2008   Cephalexin Rash 12/10/2012   Cymbalta [duloxetine hcl] Other (See Comments) 09/09/2016   Depakote [divalproex sodium] Shortness Of Breath 12/10/2012   Gadolinium derivatives  07/20/2019   Haloperidol Shortness Of Breath 05/15/2015   Meperidine Nausea And Vomiting 12/10/2012   Metoclopramide Shortness Of Breath 09/16/2016   Morphine  02/20/2022   Penicillins Rash 06/13/2008   Prochlorperazine Other (See Comments) 09/16/2016   Tramadol hcl Palpitations 09/09/2016   Trazodone Shortness Of Breath 01/23/2015   Meloxicam Other (See Comments) 03/17/2014   Neomycin-bacitracin zn-polymyx Rash 12/10/2012   Tomato Hives 04/29/2016   Other  03/17/2014   Shellfish allergy   03/17/2014   Shellfish-derived products Other (See Comments) 03/17/2014   Bacitra-neomycin-polymyxin-hc Rash 09/12/2022   Bacitracin-neomycin-polymyxin Rash 05/15/2015   Cephalosporins Rash 05/15/2015   Ibuprofen Other (See Comments) and Rash 01/05/2013   Latex Itching 03/17/2014   Nsaids Other (See Comments) 03/17/2014   Sulfa antibiotics Rash 03/17/2014   Sulfonamide derivatives Rash 06/13/2008    Family History  Problem Relation Age of Onset   Depression Mother    Hypertension Mother    Cancer Mother        Skin   Hyperlipidemia Mother    Anxiety disorder Mother    Migraines Mother    Alcohol abuse Father    Depression Father    Stroke Father    Heart disease Father    Hypertension Father    Anxiety disorder Father    Depression Sister    Hyperlipidemia Sister    Diabetes Sister    Hypertension Sister    Polycystic ovary syndrome Sister    Bipolar disorder Sister    Anxiety disorder Sister    Migraines Sister    Depression Sister    Hypertension Sister    Anxiety disorder Sister    Migraines Sister    Breast cancer Maternal Grandmother 60   Cancer Maternal Grandmother 63       Breast   Thyroid disease Maternal Grandmother    Arthritis Maternal Grandmother    Hyperlipidemia Maternal Grandmother    Aneurysm Maternal Grandfather    Hypertension Maternal Grandfather    Heart disease Maternal Grandfather    Alzheimer's disease Paternal Grandmother    Heart attack Paternal Grandfather    Hypertension Paternal Grandfather    COPD Paternal Grandfather    Heart disease Paternal Grandfather    Migraines Daughter    Other Daughter        leomyoa scaroma   Migraines Daughter    Migraines Son    Alzheimer's disease Other    Bladder Cancer Neg Hx    Kidney cancer Neg Hx     Social History   Socioeconomic History   Marital status: Married    Spouse name: brian   Number of children: 3   Years of education: Not on file   Highest education level: Associate  degree: academic program  Occupational History   Occupation: disbled    Comment: not able  Tobacco Use   Smoking status: Former    Packs/day: 2.00    Years: 3.00    Total pack years: 6.00    Types: Cigarettes    Quit date: 12/10/1992  Years since quitting: 29.8   Smokeless tobacco: Never   Tobacco comments:    quit 25 years ago  Vaping Use   Vaping Use: Never used  Substance and Sexual Activity   Alcohol use: No    Comment: socially   Drug use: No   Sexual activity: Not Currently    Partners: Male    Birth control/protection: Surgical  Other Topics Concern   Not on file  Social History Narrative   Lives at home with her husband and 2 of her children   Right handed   Caffeine: 0-2 cups daily   Social Determinants of Health   Financial Resource Strain: High Risk (08/29/2017)   Overall Financial Resource Strain (CARDIA)    Difficulty of Paying Living Expenses: Very hard  Food Insecurity: Food Insecurity Present (08/29/2017)   Hunger Vital Sign    Worried About Charity fundraiser in the Last Year: Often true    Ran Out of Food in the Last Year: Often true  Transportation Needs: Unmet Transportation Needs (08/29/2017)   PRAPARE - Hydrologist (Medical): Yes    Lack of Transportation (Non-Medical): Yes  Physical Activity: Inactive (08/29/2017)   Exercise Vital Sign    Days of Exercise per Week: 0 days    Minutes of Exercise per Session: 0 min  Stress: Stress Concern Present (08/29/2017)   [place]    Feeling of Stress : Rather much  Social Connections: Somewhat Isolated (08/29/2017)   Social Connection and Isolation Panel [NHANES]    Frequency of Communication with Friends and Family: Never    Frequency of Social Gatherings with Friends and Family: Never    Attends Religious Services: More than 4 times per year    Active Member of [employer] or Organizations: No    Attends  Archivist Meetings: Never    Marital Status: Married  Human resources officer Violence: Not At Risk (12/16/2017)   Humiliation, Afraid, Rape, and Kick questionnaire    Fear of Current or Ex-Partner: No    Emotionally Abused: No    Physically Abused: No    Sexually Abused: No    Review of Systems: See HPI, otherwise negative ROS  Physical Exam: BP 123/61   Pulse 92   Temp 99 F (37.2 C) (Temporal)   Resp 18   Ht '5\' 3"'$  (1.6 m)   Wt 101.6 kg   LMP 10/22/2021 (Approximate)   SpO2 98%   BMI 39.68 kg/m  General:   Alert,  pleasant and cooperative in NAD Head:  Normocephalic and atraumatic. Neck:  Supple; no masses or thyromegaly. Lungs:  Clear throughout to auscultation, normal respiratory effort.    Heart:  +S1, +S2, Regular rate and rhythm, No edema. Abdomen:  Soft, nontender and nondistended. Normal bowel sounds, without guarding, and without rebound.   Neurologic:  Alert and  oriented x4;  grossly normal neurologically.  Impression/Plan: Calina Patrie is here for an colonoscopy to be performed for Screening colonoscopy average risk   Risks, benefits, limitations, and alternatives regarding  colonoscopy have been reviewed with the patient.  Questions have been answered.  All parties agreeable.   Jonathon Bellows, MD  10/02/2022, 10:32 AM

## 2022-10-02 NOTE — Op Note (Signed)
[facility] Gastroenterology Patient Name: Dana Bradley Procedure Date: 10/02/2022 10:01 AM MRN: [id number] Account #: [id number] Date of Birth: 10/29/1970 Admit Type: Outpatient Age: 52 Room: [facility] ENDO ROOM 4 Gender: Female Note Status: Finalized Instrument Name: Jasper Riling [id number] Procedure:             Colonoscopy Indications:           Screening for colorectal malignant neoplasm Providers:             Jonathon Bellows MD, MD Medicines:             Monitored Anesthesia Care Complications:         No immediate complications. Procedure:             Pre-Anesthesia Assessment:                        - Prior to the procedure, a History and Physical was                         performed, and patient medications, allergies and                         sensitivities were reviewed. The patient's tolerance                         of previous anesthesia was reviewed.                        - The risks and benefits of the procedure and the                         sedation options and risks were discussed with the                         patient. All questions were answered and informed                         consent was obtained.                        - ASA Grade Assessment: II - A patient with mild                         systemic disease.                        After obtaining informed consent, the colonoscope was                         passed under direct vision. Throughout the procedure,                         the patient's blood pressure, pulse, and oxygen                         saturations were monitored continuously. The                         Colonoscope was introduced through the anus and  advanced to the the cecum, identified by the                         appendiceal orifice. The colonoscopy was performed                         with ease. The patient tolerated the procedure well.                         The quality of the bowel preparation  was                         unsatisfactory. The ileocecal valve, appendiceal                         orifice, and rectum were photographed. Findings:      Extensive amounts of liquid stool was found in the entire colon,       interfering with visualization. Impression:            - Preparation of the colon was unsatisfactory.                        - Stool in the entire examined colon.                        - No specimens collected. Recommendation:        - Discharge patient to home (with escort).                        - Resume previous diet.                        - Continue present medications.                        - Repeat colonoscopy in 1 month because the bowel                         preparation was suboptimal. Procedure Code(s):     --- Professional ---                        [phone], Colonoscopy, flexible; diagnostic, including                         collection of specimen(s) by brushing or washing, when                         performed (separate procedure) Diagnosis Code(s):     --- Professional ---                        Z12.11, Encounter for screening for malignant neoplasm                         of colon CPT copyright 2022 American Medical Association. All rights reserved. The codes documented in this report are preliminary and upon coder review may  be revised to meet current compliance requirements. Jonathon Bellows, MD Jonathon Bellows MD, MD 10/02/2022 10:49:11 AM This report has been signed electronically. Number of Addenda:  0 Note Initiated On: 10/02/2022 10:01 AM Scope Withdrawal Time: 0 hours 1 minute 40 seconds  Total Procedure Duration: 0 hours 7 minutes 41 seconds  Estimated Blood Loss:  Estimated blood loss: none.      [facility]

## 2022-10-02 NOTE — Anesthesia Preprocedure Evaluation (Signed)
Anesthesia Evaluation  Patient identified by MRN, date of birth, ID band Patient awake    Reviewed: Allergy & Precautions, NPO status , Patient's Chart, lab work & pertinent test results  Airway Mallampati: III  TM Distance: >3 FB Neck ROM: full    Dental  (+) Teeth Intact   Pulmonary neg pulmonary ROS, sleep apnea and Continuous Positive Airway Pressure Ventilation , former smoker   Pulmonary exam normal  + decreased breath sounds      Cardiovascular Exercise Tolerance: Good hypertension, Pt. on medications negative cardio ROS Normal cardiovascular exam+ dysrhythmias Supra Ventricular Tachycardia  Rhythm:Regular     Neuro/Psych  Headaches  Anxiety Depression    negative neurological ROS  negative psych ROS   GI/Hepatic negative GI ROS, Neg liver ROS,GERD  Medicated,,  Endo/Other  negative endocrine ROSdiabetes, Type 2  Morbid obesity  Renal/GU negative Renal ROS  negative genitourinary   Musculoskeletal  (+) Arthritis ,  Fibromyalgia -  Abdominal  (+) + obese  Peds negative pediatric ROS (+)  Hematology negative hematology ROS (+)   Anesthesia Other Findings Past Medical History: 04/07/2017: Acute postoperative pain No date: Anxiety No date: Bursitis 12/12/2017: Chronic fatigue No date: Chronic fatigue syndrome 2021: Colitis No date: Diabetes mellitus without complication ([place]) 97/98/9211: Edema leg No date: Fibromyalgia No date: GERD (gastroesophageal reflux disease) No date: IBS (irritable bowel syndrome) 12/21/2008: Knee pain, bilateral     Comment:  Qualifier: Diagnosis of  By: Hassell Done FNP, Tori Milks   No date: Lumbar discitis No date: Migraines No date: Osteoarthritis 04/10/2015: Right hand pain     Comment:  [facility] Neurology has done nerve conduction studies and              ruled out carpal tunnel.  No date: Sleep apnea No date: Spinal stenosis No date: SVT (supraventricular tachycardia) No  date: Vertigo 05/01/2016: Vitamin D deficiency  Past Surgical History: No date: ABLATION     Comment:  Uterine No date: BREAST BIOPSY; Left     Comment:  2014 U/S bx fibroadenoma No date: CARDIAC CATHETERIZATION     Comment:  with ablation 09/26/2022: COLONOSCOPY; N/A     Comment:  Procedure: COLONOSCOPY;  Surgeon: Jonathon Bellows, MD;                Location: [facility] ENDOSCOPY;  Service: Gastroenterology;                Laterality: N/A; 05/17/2015: COLONOSCOPY WITH PROPOFOL; N/A     Comment:  Procedure: COLONOSCOPY WITH PROPOFOL;  Surgeon: Manya Silvas, MD;  Location: [facility] ENDOSCOPY;  Service:               Endoscopy;  Laterality: N/A; 03/20/2020: COLONOSCOPY WITH PROPOFOL; N/A     Comment:  Procedure: COLONOSCOPY WITH PROPOFOL;  Surgeon: Jonathon Bellows, MD;  Location: [facility] ENDOSCOPY;  Service:               Gastroenterology;  Laterality: N/A; 09/25/2022: COLONOSCOPY WITH PROPOFOL; N/A     Comment:  Procedure: COLONOSCOPY WITH PROPOFOL;  Surgeon: Jonathon Bellows, MD;  Location: [facility] ENDOSCOPY;  Service:               Gastroenterology;  Laterality: N/A; 05/17/2015: ESOPHAGOGASTRODUODENOSCOPY; N/A     Comment:  Procedure: ESOPHAGOGASTRODUODENOSCOPY (EGD);  Surgeon:               Manya Silvas, MD;  Location: [facility] ENDOSCOPY;                Service: Endoscopy;  Laterality: N/A; 09/25/2022: ESOPHAGOGASTRODUODENOSCOPY; N/A     Comment:  Procedure: ESOPHAGOGASTRODUODENOSCOPY (EGD);  Surgeon:               Jonathon Bellows, MD;  Location: [facility] ENDOSCOPY;  Service:               Gastroenterology;  Laterality: N/A; 09/26/2022: ESOPHAGOGASTRODUODENOSCOPY; N/A     Comment:  Procedure: ESOPHAGOGASTRODUODENOSCOPY (EGD);  Surgeon:               Jonathon Bellows, MD;  Location: [facility] ENDOSCOPY;  Service:               Gastroenterology;  Laterality: N/A; No date: KNEE ARTHROSCOPY No date: spg     Comment:  6/18 1998: Tibial Tubercle Bypass; Right 10/01/1999: TUBAL  LIGATION     Reproductive/Obstetrics negative OB ROS                             Anesthesia Physical Anesthesia Plan  ASA: 3  Anesthesia Plan: General   Post-op Pain Management:    Induction: Intravenous  PONV Risk Score and Plan: Propofol infusion and TIVA  Airway Management Planned: Natural Airway  Additional Equipment:   Intra-op Plan:   Post-operative Plan:   Informed Consent: I have reviewed the patients History and Physical, chart, labs and discussed the procedure including the risks, benefits and alternatives for the proposed anesthesia with the patient or authorized representative who has indicated his/her understanding and acceptance.     Dental Advisory Given  Plan Discussed with: CRNA and Surgeon  Anesthesia Plan Comments:        Anesthesia Quick Evaluation

## 2022-10-02 NOTE — Anesthesia Postprocedure Evaluation (Signed)
Anesthesia Post Note  Patient: Dana Bradley  Procedure(s) Performed: COLONOSCOPY WITH PROPOFOL  Patient location during evaluation: PACU Anesthesia Type: General Level of consciousness: awake and awake and alert Pain management: satisfactory to patient Vital Signs Assessment: post-procedure vital signs reviewed and stable Respiratory status: spontaneous breathing and respiratory function stable Cardiovascular status: stable Anesthetic complications: no  No notable events documented.   Last Vitals:  Vitals:   10/02/22 1110 10/02/22 1118  BP: 111/64   Pulse: 87 88  Resp: 14 14  Temp:    SpO2: 98% 100%    Last Pain:  Vitals:   10/02/22 1053  TempSrc: Temporal  PainSc:                  VAN STAVEREN,Maciah Schweigert

## 2022-10-03 ENCOUNTER — Other Ambulatory Visit: Payer: Self-pay | Admitting: Physical Medicine and Rehabilitation

## 2022-10-03 ENCOUNTER — Other Ambulatory Visit: Payer: Self-pay | Admitting: Neurosurgery

## 2022-10-03 ENCOUNTER — Encounter: Payer: Self-pay | Admitting: Gastroenterology

## 2022-10-03 DIAGNOSIS — G894 Chronic pain syndrome: Secondary | ICD-10-CM

## 2022-10-03 MED ORDER — SERTRALINE HCL 25 MG PO TABS: 25.0000 mg | ORAL_TABLET | Freq: Every day | ORAL | 3 refills | Status: DC

## 2022-10-03 NOTE — Progress Notes (Signed)
Orders placed for psych eval and referral to [facility] for SCS eval

## 2022-10-07 ENCOUNTER — Encounter: Payer: Medicaid Other | Attending: Psychology | Admitting: Psychology

## 2022-10-07 DIAGNOSIS — K56609 Unspecified intestinal obstruction, unspecified as to partial versus complete obstruction: Secondary | ICD-10-CM | POA: Insufficient documentation

## 2022-10-07 DIAGNOSIS — Z91018 Allergy to other foods: Secondary | ICD-10-CM | POA: Diagnosis present

## 2022-10-07 DIAGNOSIS — K580 Irritable bowel syndrome with diarrhea: Secondary | ICD-10-CM | POA: Diagnosis present

## 2022-10-07 DIAGNOSIS — G4733 Obstructive sleep apnea (adult) (pediatric): Secondary | ICD-10-CM | POA: Diagnosis not present

## 2022-10-07 DIAGNOSIS — G43001 Migraine without aura, not intractable, with status migrainosus: Secondary | ICD-10-CM

## 2022-10-07 DIAGNOSIS — F329 Major depressive disorder, single episode, unspecified: Secondary | ICD-10-CM

## 2022-10-07 DIAGNOSIS — G500 Trigeminal neuralgia: Secondary | ICD-10-CM | POA: Diagnosis not present

## 2022-10-07 DIAGNOSIS — M5416 Radiculopathy, lumbar region: Secondary | ICD-10-CM | POA: Diagnosis not present

## 2022-10-07 DIAGNOSIS — M797 Fibromyalgia: Secondary | ICD-10-CM

## 2022-10-08 ENCOUNTER — Encounter (HOSPITAL_BASED_OUTPATIENT_CLINIC_OR_DEPARTMENT_OTHER): Payer: Medicaid Other | Admitting: Physical Medicine and Rehabilitation

## 2022-10-08 DIAGNOSIS — F329 Major depressive disorder, single episode, unspecified: Secondary | ICD-10-CM

## 2022-10-08 MED ORDER — VITAMIN D (ERGOCALCIFEROL) 1.25 MG (50000 UNIT) PO CAPS: 50000.0000 [IU] | ORAL_CAPSULE | ORAL | 0 refills | Status: DC

## 2022-10-08 NOTE — Progress Notes (Signed)
Subjective:    Patient ID: Dana Bradley, female    DOB: 1970/11/02, 52 y.o.   MRN: [id number]  HPI   An audio/video tele-health visit is felt to be the most appropriate encounter for this patient at this time. This is a follow up tele-visit via phone. The patient is at home. MD is at office. Prior to scheduling this appointment, our staff discussed the limitations of evaluation and management by telemedicine and the availability of in-person appointments. The patient expressed understanding and agreed to proceed.   1) Chronic Migraine:  -Dana Bradley a 52 year old woman who presents for f/u of her chronic migraine  -she feels she is getting a migraine every day -the Fioricet and Nurtec or Maxaalt together give her relief, but not complete relief.  -the days run into each other and its hard to remember the differences between them -she has been stressed in caring for her mother -she has noted recently that when she lays flat she feels fine but as soon as she sits up she feels an excruciating migraine -migraine continues to worsen from sitting to standing position -her daughter has similar symptoms but felt better after starting acetazolamide for presumed diagnosis of intracranial hypertension. She asks whether she may benefit from this medication as well.  Terrible stomach spasms and diarrhea. 12 hours later debilitating migraine. It usually starts in the afternoon. She feels that this may be part of her prodrome. She also has terrible vertigo that is getting worse. The Fiorcet helps a lot, the Migronal helps with the frontal headache. These make the pain bearable.  -having migraines close to every 24H -she is not functional -she is not sure if Maxaalt is helping. She is not taking with the Nurtec. It is helping some but she is not sure how much. The fioricet alone helps by putting her to sleep.  -she has not heart any news about the Vypeti infusion. The medication was approved  -has been  having more dizziness.  -the pain has been worst since she had the occipital nerve blocks.  -she knows it is stress related -she feels a pain radiating down her arm as well. It also runs into her head and into her face.  -Sometimes her whole left side is fizzy.  -she has tried Amitriptyline.  -have been awful since December. Had a migraine from Dec 1st throughout 9th.  -the day before she was incapacitated.  -Vyepti has been approved -The nurtec and the Fiorocet help together to break the migraine.  -she needs a refill of her Fioricet and nausea, she finds this helpful.  -Nurtec- she is still working with the company to get this improved -she asks about prodrome and postdrome (she experiences cramping and diarrhea).  -on Sunday she closed out a 56 hour migraines. -she has been trying to follow ketogenic diet.  -Nurtec helping- 5 days in a row without a migraines.  -she feels if she could take it as a rescue medication in addition to every other day this may help.   2) Vertigo -Symptoms are worsening.  -She feels that this may be leading up to the vertigo.  -She is interested in vestibular therapy.  -she had a heating pad on the shoulder and arm.   3) Cervical myofasical pain syndrome: -Muscles of neck are very tight, pain is worse on the left side.  -She did not find spinal injections helpful, they made the pain worse.   4) Insomnia: she sleeps in chunks -she  is willing to increase amitriptyline to '50mg'$ .   5) Cognitive impairments: -she loves to cook and noticed she has been making more mistakes -she thinks the headaches are a problem.  6) Pain radiating from back into legs -feels twitches -worse at night -legs kick out at night.   7) IBS -terrible in the night -2am on Sunday. -horrible pain -often with diarrhea and improves symptoms after she has diarrhea.  -cramping.   8) Stress  9) depression: -she has dry mouth will wellbutrin and did not find much efficacy. She  asked if there is an alternative medication she can try -she had tried Zoloft in the past with benefits -she is going to pick up the Zoloft today and try it tonight -she would be willing to take vitamin D  Dana Bradley is a 52 year old woman who presents for follow-up of her chronic migraine. Her average pain is 6/10 and pain right now is 6/10. Her pain is intermittent, sharp, burning, stabbing, tingling, and aching.   She recently had 7 days without migraine. She is not sure what was associated with this. She had a pretty terrible bounceback.  She picked up the Migranal nasal spray. Our CMA explained how it works.   Her pain doctor is going to stop prescribing the Lyrica and she needs to find a new doctor to prescribe this.    Pain Inventory Average Pain 6 Pain Right Now 6 My pain is constant, sharp, burning, stabbing, tingling, and aching  In the last 24 hours, has pain interfered with the following? General activity 7 Relation with others 8 Enjoyment of life 8 What TIME of day is your pain at its worst? morning , evening, and night Sleep (in general) Poor  Pain is worse with: walking, bending, standing, and some activites Pain improves with: rest, medication, and TENS Relief from Meds: 5     Family History  Problem Relation Age of Onset   Depression Mother    Hypertension Mother    Cancer Mother        Skin   Hyperlipidemia Mother    Anxiety disorder Mother    Migraines Mother    Alcohol abuse Father    Depression Father    Stroke Father    Heart disease Father    Hypertension Father    Anxiety disorder Father    Depression Sister    Hyperlipidemia Sister    Diabetes Sister    Hypertension Sister    Polycystic ovary syndrome Sister    Bipolar disorder Sister    Anxiety disorder Sister    Migraines Sister    Depression Sister    Hypertension Sister    Anxiety disorder Sister    Migraines Sister    Breast cancer Maternal Grandmother 9   Cancer Maternal  Grandmother 63       Breast   Thyroid disease Maternal Grandmother    Arthritis Maternal Grandmother    Hyperlipidemia Maternal Grandmother    Aneurysm Maternal Grandfather    Hypertension Maternal Grandfather    Heart disease Maternal Grandfather    Alzheimer's disease Paternal Grandmother    Heart attack Paternal Grandfather    Hypertension Paternal Grandfather    COPD Paternal Grandfather    Heart disease Paternal Grandfather    Migraines Daughter    Other Daughter        leomyoa scaroma   Migraines Daughter    Migraines Son    Alzheimer's disease Other    Bladder Cancer Neg Hx  Kidney cancer Neg Hx    Social History   Socioeconomic History   Marital status: Married    Spouse name: brian   Number of children: 3   Years of education: Not on file   Highest education level: Associate degree: academic program  Occupational History   Occupation: disbled    Comment: not able  Tobacco Use   Smoking status: Former    Packs/day: 2.00    Years: 3.00    Total pack years: 6.00    Types: Cigarettes    Quit date: 12/10/1992    Years since quitting: 29.8   Smokeless tobacco: Never   Tobacco comments:    quit 25 years ago  Vaping Use   Vaping Use: Never used  Substance and Sexual Activity   Alcohol use: No    Comment: socially   Drug use: No   Sexual activity: Not Currently    Partners: Male    Birth control/protection: Surgical  Other Topics Concern   Not on file  Social History Narrative   Lives at home with her husband and 2 of her children   Right handed   Caffeine: 0-2 cups daily   Social Determinants of Health   Financial Resource Strain: High Risk (08/29/2017)   Overall Financial Resource Strain (CARDIA)    Difficulty of Paying Living Expenses: Very hard  Food Insecurity: Food Insecurity Present (08/29/2017)   Hunger Vital Sign    Worried About Running Out of Food in the Last Year: Often true    Ran Out of Food in the Last Year: Often true   Transportation Needs: Unmet Transportation Needs (08/29/2017)   PRAPARE - Hydrologist (Medical): Yes    Lack of Transportation (Non-Medical): Yes  Physical Activity: Inactive (08/29/2017)   Exercise Vital Sign    Days of Exercise per Week: 0 days    Minutes of Exercise per Session: 0 min  Stress: Stress Concern Present (08/29/2017)   [place] of Stress : Rather much  Social Connections: Somewhat Isolated (08/29/2017)   Social Connection and Isolation Panel [NHANES]    Frequency of Communication with Friends and Family: Never    Frequency of Social Gatherings with Friends and Family: Never    Attends Religious Services: More than 4 times per year    Active Member of Clubs or Organizations: No    Attends Archivist Meetings: Never    Marital Status: Married   Past Surgical History:  Procedure Laterality Date   ABLATION     Uterine   BREAST BIOPSY Left    2014 U/S bx fibroadenoma   CARDIAC CATHETERIZATION     with ablation   COLONOSCOPY N/A 09/26/2022   Procedure: COLONOSCOPY;  Surgeon: Jonathon Bellows, MD;  Location: [facility] ENDOSCOPY;  Service: Gastroenterology;  Laterality: N/A;   COLONOSCOPY WITH PROPOFOL N/A 05/17/2015   Procedure: COLONOSCOPY WITH PROPOFOL;  Surgeon: Manya Silvas, MD;  Location: [facility] ENDOSCOPY;  Service: Endoscopy;  Laterality: N/A;   COLONOSCOPY WITH PROPOFOL N/A 03/20/2020   Procedure: COLONOSCOPY WITH PROPOFOL;  Surgeon: Jonathon Bellows, MD;  Location: [facility] ENDOSCOPY;  Service: Gastroenterology;  Laterality: N/A;   COLONOSCOPY WITH PROPOFOL N/A 09/25/2022   Procedure: COLONOSCOPY WITH PROPOFOL;  Surgeon: Jonathon Bellows, MD;  Location: [name] ENDOSCOPY;  Service: Gastroenterology;  Laterality: N/A;   COLONOSCOPY WITH PROPOFOL N/A 10/02/2022   Procedure: COLONOSCOPY WITH PROPOFOL;  Surgeon: Jonathon Bellows, MD;  Location: [facility]  ENDOSCOPY;  Service:  Gastroenterology;  Laterality: N/A;   ESOPHAGOGASTRODUODENOSCOPY N/A 05/17/2015   Procedure: ESOPHAGOGASTRODUODENOSCOPY (EGD);  Surgeon: Manya Silvas, MD;  Location: [facility] ENDOSCOPY;  Service: Endoscopy;  Laterality: N/A;   ESOPHAGOGASTRODUODENOSCOPY N/A 09/25/2022   Procedure: ESOPHAGOGASTRODUODENOSCOPY (EGD);  Surgeon: Jonathon Bellows, MD;  Location: [facility] ENDOSCOPY;  Service: Gastroenterology;  Laterality: N/A;   ESOPHAGOGASTRODUODENOSCOPY N/A 09/26/2022   Procedure: ESOPHAGOGASTRODUODENOSCOPY (EGD);  Surgeon: Jonathon Bellows, MD;  Location: [facility] ENDOSCOPY;  Service: Gastroenterology;  Laterality: N/A;   KNEE ARTHROSCOPY     spg     6/18   Tibial Tubercle Bypass Right 1998   TUBAL LIGATION  10/01/1999   Past Surgical History:  Procedure Laterality Date   ABLATION     Uterine   BREAST BIOPSY Left    2014 U/S bx fibroadenoma   CARDIAC CATHETERIZATION     with ablation   COLONOSCOPY N/A 09/26/2022   Procedure: COLONOSCOPY;  Surgeon: Jonathon Bellows, MD;  Location: [facility] ENDOSCOPY;  Service: Gastroenterology;  Laterality: N/A;   COLONOSCOPY WITH PROPOFOL N/A 05/17/2015   Procedure: COLONOSCOPY WITH PROPOFOL;  Surgeon: Manya Silvas, MD;  Location: [facility] ENDOSCOPY;  Service: Endoscopy;  Laterality: N/A;   COLONOSCOPY WITH PROPOFOL N/A 03/20/2020   Procedure: COLONOSCOPY WITH PROPOFOL;  Surgeon: Jonathon Bellows, MD;  Location: [facility] ENDOSCOPY;  Service: Gastroenterology;  Laterality: N/A;   COLONOSCOPY WITH PROPOFOL N/A 09/25/2022   Procedure: COLONOSCOPY WITH PROPOFOL;  Surgeon: Jonathon Bellows, MD;  Location: [facility] ENDOSCOPY;  Service: Gastroenterology;  Laterality: N/A;   COLONOSCOPY WITH PROPOFOL N/A 10/02/2022   Procedure: COLONOSCOPY WITH PROPOFOL;  Surgeon: Jonathon Bellows, MD;  Location: [facility] ENDOSCOPY;  Service: Gastroenterology;  Laterality: N/A;   ESOPHAGOGASTRODUODENOSCOPY N/A 05/17/2015   Procedure: ESOPHAGOGASTRODUODENOSCOPY (EGD);  Surgeon: Manya Silvas, MD;  Location: [facility] ENDOSCOPY;   Service: Endoscopy;  Laterality: N/A;   ESOPHAGOGASTRODUODENOSCOPY N/A 09/25/2022   Procedure: ESOPHAGOGASTRODUODENOSCOPY (EGD);  Surgeon: Jonathon Bellows, MD;  Location: [facility] ENDOSCOPY;  Service: Gastroenterology;  Laterality: N/A;   ESOPHAGOGASTRODUODENOSCOPY N/A 09/26/2022   Procedure: ESOPHAGOGASTRODUODENOSCOPY (EGD);  Surgeon: Jonathon Bellows, MD;  Location: [facility] ENDOSCOPY;  Service: Gastroenterology;  Laterality: N/A;   KNEE ARTHROSCOPY     spg     6/18   Tibial Tubercle Bypass Right 1998   TUBAL LIGATION  10/01/1999   Past Medical History:  Diagnosis Date   Acute postoperative pain 04/07/2017   Anxiety    Bursitis    Chronic fatigue 12/12/2017   Chronic fatigue syndrome    Colitis 2021   Diabetes mellitus without complication ([place])    Edema leg 05/02/2015   Fibromyalgia    GERD (gastroesophageal reflux disease)    IBS (irritable bowel syndrome)    Knee pain, bilateral 12/21/2008   Qualifier: Diagnosis of  By: Hassell Done FNP, [place]     Lumbar discitis    Migraines    Osteoarthritis    Right hand pain 04/10/2015   [facility] has done nerve conduction studies and ruled out carpal tunnel.    Sleep apnea    Spinal stenosis    SVT (supraventricular tachycardia)    Vertigo    Vitamin D deficiency 05/01/2016   LMP 10/22/2021 (Approximate)   Opioid Risk Score:   Fall Risk Score:  `1  Depression screen [facility] 2/9     10/01/2022   11:21 AM 09/18/2022   10:43 AM 09/06/2022    8:59 AM 06/07/2022    9:25 AM 05/27/2022    9:45 AM 04/04/2022    1:35 PM 03/04/2022  2:44 PM  Depression screen PHQ 2/9  Decreased Interest 2 0 0 0 0 2 0  Down, Depressed, Hopeless 3 0 0 0 0 2 0  PHQ - 2 Score 5 0 0 0 0 4 0  Altered sleeping 2  0   2   Tired, decreased energy 1  0   3   Change in appetite 0  0   0   Feeling bad or failure about yourself  0  0   2   Trouble concentrating 0  0   2   Moving slowly or fidgety/restless 0  0   0   Suicidal thoughts 0  0   0   PHQ-9 Score 8  0   13    Difficult doing work/chores Somewhat difficult  Not difficult at all   Somewhat difficult    Review of Systems  Constitutional: Negative.   HENT: Negative.    Eyes: Negative.   Respiratory: Negative.    Cardiovascular: Negative.   Gastrointestinal: Negative.   Endocrine: Negative.   Genitourinary: Negative.   Musculoskeletal:  Positive for back pain.       Left side of body from head to toes  Skin: Negative.   Allergic/Immunologic: Negative.   Neurological:  Positive for dizziness and headaches.  Hematological: Negative.   Psychiatric/Behavioral: Negative.    All other systems reviewed and are negative.      Objective:   Physical Exam Not performed     Assessment & Plan:  Mrs. Paulsen is a 52 year old woman who presents for f/u with chronic intractable migraines s/p numerous treatments, severe fibromyalgia, and IBS, and nausea, and vertigo.     1) Vertigo: In the future, restart for cervical myofascial pain syndrome: myofascial release, postural correction, stretching and strengthening of the muscles of the neck and upper back, development of HEP. Conitnue heating pads to muscles of upper back and neck. She is doing HEP.  -referred for vestibular therapy.  -discussed current symptoms.  -discussed response to meclizine -discussed worsening symptoms when in car   2) Migraines: Refilled Fioricet, which is one of the medications that helps her. Refilled today. She takes this at least once every time she gets a migraine. Advised to use upon migraine onset and to not use more frequently than q6H during migraine. Refilled Zofran for nausea last week, and this has been helping her. Continue metoprolol which can be helpful in migraine prophylaxis (HR is well controlled). Prescribed ergot nasal spray to try upon migraine initiation- advised no more frequently than 4 sprays per hour (still awaiting on prior auth). Discussed avoiding foods that may trigger migraines.  -recommended drinking a  glass of water every morning before standing up as her symptoms sound like orthostatic hypotension which could worsen migraines -recommended checking blood pressure daily in supine, sitting, and standing positions and this should help Bradley identify if symptoms are from orthostatic hypotension or intracranial hypertension -discussed trying daily water first before trying medication acetazolamide.  -continue vitamin D  -Discussed that I can provide refill for her Lyrica when she needs it.  -topamax was not effective.  -When she gets the migraines her loss of words is getting worse.  -Ordered Vyepti- she will find out cost from her pharmacy. She will bring paperwork for this next visit. She is excited to try it.  -continue Nurtec  -continue ear piercing. Pain feels more like pressure and less acidic.  -no benefits from Botox.  -continue to track migraines.  -  Continue Baclofen for pain relief. Minimize use of Hydrocodone. She is only taking Lyrica once per day to make it last. Continue Migranal which is helping. Will occipital nerve block -Discussed that Mrs. Adamson's greatest source of happiness is her family. This community will be essential in helping her recover from her chronic pain and to increase her daily activity. Her daughter is also suffering from similar migraines unfortunately.  -Continue ginger, herbs, turmeric, blueberries, eating real food. Continue cutting down sugar. Using local honey is a great alternative. -Provided with a pain relief journal and discussed that it contains foods and lifestyle tips to naturally help to improve pain. Discussed that these lifestyle strategies are also very good for health unlike some medications which can have negative side effects. Discussed that the act of keeping a journal can be therapeutic and helpful to realize patterns what helps to trigger and alleviate pain.   -Continue Nurtec every other day- may use for breakthrough migraines as well.   -Discussed plan for Vypeti at [facility] infusion center.  Discussed that exercise is one of the most effective treatments for fibromyalgia. This will also help with her obesity. Made goal with Mrs. Deakins to walk outside her home at least once per day, and to garden at least once per day (her favorite activity). Can use elliptical which she has at home on rainy days. The heat has been oppressive and so she has been trying to do the latter more.  Foods to alleviate migraine:  1) dark leafy greens 2) avocado 3) tuna 4) samon and mackerel 5) beans and legumes Supplements that can be helpful: feverfew, B12, and magnesium  Foods to avoid in migraine: 1) Excessive (or irregular timing) coffee 2) red wine 3) aged cheeses 4) chocolate 5) citrus fruits 6) aspartame and other artifical sweeteners 7) yeast 8) MSG (in processed foods) 9) processed and cured meats 10) nuts and certain seeds 11) chicken livers and other organ meats 12) dairy products like buttermilk, sour cream, and yogurt 13) dried fruits like dates, figs, and raisins 14) garlic 15) onions 16) potato chips 17) pickled foods like olives and sauerkraut 18) some fresh fruits like ripe banana, papaya, red plums, raspberries, kiwi, pineapple 19) tomato-based products  Recommend to keep a migraine diary: rate daily the severity of your headache (1-10) and what foods you eat that day to help determine patterns.    3) Cervical facet arthrosis: Cervical XR normal- discussed results with patient. Pain is worse on left side.   4) Anxiety and depression -Her daughter was recently diagnosed with sarcoma and this is a great source of stress and fear to Mrs. [name]. It has been a turbulent time for her and this has understandably worsened her symptoms. Discussed benefits of gratitude journaling and she plans to try this.  -discussed her life stressor, her mom's illness -discussed her difficult wean off Pristiq -prescribed zoloft -stop  Wellbutrin -prescribed vitamin D -recommended Bioptemizer's magnesium breakthrough -recommended methylated multivitamin -discussed following with Dr. Sima Matas.   Insomnia:  -continue amitirptyline to '50mg'$ .  -Try to go outside near sunrise -Get exercise during the day.  -Discussed good sleep hygiene: turning off all devices an hour before bedtime.  -Chamomile tea with dinner.  -Can consider over the counter melatonin -discussed magnesium  Nausea:  -continue zofran -start B6 -discussed scopolamine patch, discussed to place behind ear and lave on 72 hours.    Cervical myofascial pain syndrome: Will try some trigger point injections with occipital nerve block next visit.  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 Letta Pate for ESI unchanged. Shallow broad-based disc protrusion L4-5 unchanged. -continue Norco -continue turmeric Turmeric to reduce inflammation--can be used in cooking or taken as a supplement.  Benefits of turmeric:  -Highly anti-inflammatory  -Increases antioxidants  -Improves memory, attention, brain disease  -Lowers risk of heart disease  -May help prevent cancer  -Decreases pain  -Alleviates depression  -Delays aging and decreases risk of chronic disease  -Consume with black pepper to increase absorption    Turmeric Milk Recipe:  1 cup milk  1 tsp turmeric  1 tsp cinnamon  1 tsp grated ginger (optional)  Black pepper (boosts the anti-inflammatory properties of turmeric).  1 tsp honey   5 minutes spent in discussion of her depression, trial of Zoloft, taking Vitamin D and magnesium supplements

## 2022-10-10 ENCOUNTER — Ambulatory Visit: Payer: Medicaid Other | Admitting: Orthopaedic Surgery

## 2022-10-10 ENCOUNTER — Encounter: Payer: Self-pay | Admitting: Orthopaedic Surgery

## 2022-10-10 ENCOUNTER — Telehealth: Payer: Self-pay

## 2022-10-10 ENCOUNTER — Ambulatory Visit (INDEPENDENT_AMBULATORY_CARE_PROVIDER_SITE_OTHER): Payer: Medicaid Other

## 2022-10-10 DIAGNOSIS — G8929 Other chronic pain: Secondary | ICD-10-CM

## 2022-10-10 DIAGNOSIS — M25562 Pain in left knee: Secondary | ICD-10-CM

## 2022-10-10 NOTE — Telephone Encounter (Signed)
Dr. Dossie Arbour order a TFESI and LESI. Her insurance wouldn't approve both, the TFESI is the only one approved. Please communicate to Dr. Dossie Arbour that he can only do the TFESI on 10/15/22

## 2022-10-10 NOTE — Telephone Encounter (Signed)
Im gonna put it in the notes on the schedule that day. I communicated verbally and also wrote him a note.

## 2022-10-10 NOTE — Progress Notes (Signed)
Office Visit Note   Patient: Dana Bradley           Date of Birth: 07-01-1971           MRN: [id number] Visit Date: 10/10/2022              Requested by: Delsa Grana, PA-C [address] [place],  [place] [address] PCP: Delsa Grana, PA-C   Assessment & Plan: Visit Diagnoses:  1. 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  I recommended left knee cortisone injection as well as a patella tendon strap and topical Voltaren.  She is agreeable to this plan.  She will follow-up as needed.  Follow-Up Instructions: Return if symptoms worsen or fail to improve.   Orders:  Orders Placed This Encounter  Procedures   XR KNEE 3 VIEW LEFT   No orders of the defined types were placed in this encounter.     Procedures: No procedures performed   Clinical Data: No additional findings.   Subjective: Chief Complaint  Patient presents with   Left Knee - Pain    HPI patient is a pleasant 52 year old female who comes in today with left knee pain for the past month.  She denies any injury but notes she has been at her mom's house for the past several months where she has been frequently climbing 5 wildly steps.  The pain she has primarily to the patella tendon but she does note occasional pain to the lateral knee.  She has locking and catching.  Symptoms are worse with bearing weight and standing.  She has been taking Norco for which she takes chronically for her back.  No previous injection to the left knee.  Review of Systems as detailed in HPI.  All others reviewed and are negative.   Objective: Vital Signs: LMP 10/22/2021 (Approximate)   Physical Exam well-developed well-nourished female no acute distress.  Alert and oriented x 3.  Ortho Exam left knee exam shows no effusion.  Range of motion 0 to 115 degrees.   Increased Q angle.  Moderate patellofemoral crepitus.  Medial and lateral joint line tenderness.  She does have moderate tenderness to patella tendon.  She is neurovascular intact distally.  Specialty Comments:  No specialty comments available.  Imaging: XR KNEE 3 VIEW LEFT  Result Date: 10/10/2022 X-rays demonstrate patellofemoral arthritis.  Patella alta.  Well-preserved femoral-tibial joint spaces.    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[place]) 04/04/2022   Neurogenic bladder 01/28/2022   Urinary and fecal incontinence 01/28/2022   New onset type 2 diabetes mellitus ([place]) 01/02/2022   OSA (obstructive sleep apnea) 12/14/2021   Excessive daytime sleepiness 12/14/2021   Lateral epicondylitis, left elbow 07/19/2021   Abnormal MRI, cervical spine (02/14/2021) 02/28/2021   Cervicalgia 02/13/2021   Abnormal MRI, lumbar spine (12/27/2021) 11/09/2020   Chronic midline thoracic back pain 11/09/2020   Abnormal MRI, thoracic spine (08/02/2019) 11/09/2020   Thoracic spinal stenosis (T7-8) 11/09/2020   Prolapse of thoracic disc with radiculopathy (T7-8) 11/09/2020   Spasm of muscle of lower back 11/09/2020   Vertigo, benign paroxysmal, unspecified laterality 30/86/5784   Uncomplicated opioid dependence ([place]) 11/08/2020   Hyperalgesia 03/07/2020   Neurogenic urinary incontinence 03/01/2020   Other intervertebral  disc degeneration, lumbar region 03/01/2020   Spinal stenosis of lumbosacral region 03/01/2020   Colon cancer screening 02/06/2020   Lumbar radiculitis (Right) 09/21/2019   Chronic lower extremity pain (Bilateral) 09/06/2019   Intractable migraine with aura without status migrainosus 08/10/2019   Other specified dorsopathies, sacral and sacrococcygeal region 08/03/2019   Latex precautions, history of latex allergy 08/03/2019   History of allergy to radiographic contrast media  08/03/2019   DDD (degenerative disc disease), cervical 07/21/2019   Cervical facet syndrome (Bilateral) (L>R) 07/21/2019   DDD (degenerative disc disease), thoracic 07/21/2019   Osteoarthritis of hip (Left) 07/21/2019   Chronic groin pain (Bilateral) (L>R) 07/21/2019   Chronic hip pain (Bilateral) (L>R) 07/21/2019   Somatic dysfunction of sacroiliac joint (Bilateral) 07/21/2019   Migraine with aura and with status migrainosus, not intractable 04/06/2019   Cervico-occipital neuralgia (Left) 04/06/2019   Weakness of leg (Left) 04/05/2019   Difficulty walking 04/05/2019   Chronic migraine without aura, with intractable migraine, so stated, with status migrainosus 12/27/2018   Malar rash 09/04/2018   Trigger point of neck (Left) 03/19/2018   Occipital headache 12/25/2017   Chronic fatigue syndrome with fibromyalgia 12/12/2017   Trigger point of shoulder region (Left) 11/17/2017   Myofascial pain syndrome (Left) (trapezius muscle) 07/22/2017   Lumbar L1-2 disc protrusion (Right) 04/07/2017   Muscle spasticity 04/01/2017   Osteoarthritis of shoulder (Bilateral) 04/01/2017   Lumbar spondylosis 01/06/2017   Chronic hip pain (Left) 12/24/2016   Chronic sacroiliac joint pain (Left) 12/24/2016   Lumbar facet syndrome (Bilateral) (L>R) 12/24/2016   Lumbar radiculitis (Left) 12/24/2016   Hypertriglyceridemia 11/27/2016   History of vasovagal episode 10/30/2016   Cervicogenic headache 09/09/2016   Medication monitoring encounter 08/29/2016   Controlled substance agreement signed 08/28/2016   Plantar fasciitis of left foot 08/28/2016   Vitamin B12 deficiency 08/28/2016   Hyperlipidemia 08/28/2016   Nephrolithiasis 08/12/2016   Chronic pain syndrome 08/07/2016   Long term prescription opiate use 08/07/2016   Opiate use 08/07/2016   Long term prescription benzodiazepine use 08/07/2016   Neurogenic pain 08/07/2016   Chronic low back pain (1ry area of Pain) (Bilateral) (R>L) (midline)  08/07/2016   Chronic upper back pain (2ry area of Pain) (Bilateral) (L>R) 08/07/2016   Chronic abdominal pain (Right lower quadrant) 08/07/2016   Thoracic radiculitis (Bilateral: T10, T11) 08/07/2016   Chronic occipital neuralgia (3ry area of Pain) (Bilateral) (L>R) 08/07/2016   Chronic neck pain 08/07/2016   Chronic cervical radicular pain (Bilateral) (L>R) 08/07/2016   Chronic shoulder blade pain (Bilateral) (L>R) 08/07/2016   Chronic upper extremity pain (Bilateral) (R>L) 08/07/2016   Chronic knee pain (Bilateral) (R>L) 08/07/2016   Chronic ankle pain (Bilateral) 08/07/2016   Cervical spondylosis with myelopathy and radiculopathy 08/07/2016   Panic disorder with agoraphobia 05/29/2016   Depression, unspecified depression type 05/29/2016   Atypical lymphocytosis 05/01/2016   Vitamin D insufficiency 05/01/2016   Chronic lower extremity cramps (Bilateral) (R>L) 04/29/2016   Class 3 severe obesity with serious comorbidity and body mass index (BMI) of 45.0 to 49.9 in adult ([place]) 04/29/2016   GAD (generalized anxiety disorder) 04/29/2016   Fatigue 04/29/2016   Insomnia 07/12/2015   Migraine without aura and with status migrainosus, not intractable 07/12/2015   Chronic superficial gastritis 06/02/2015   Chronic pain of multiple joints 05/15/2015   Bilateral leg edema 05/02/2015   Paroxysmal supraventricular tachycardia ([place]) 04/17/2015   Exertional shortness of breath 04/17/2015   Bright red rectal bleeding 04/06/2015   DDD (degenerative disc disease), lumbosacral  01/24/2014   DDD (degenerative disc disease), lumbar 01/24/2014   Cervico-occipital neuralgia 12/29/2013   Fibromyalgia 12/29/2013   Migraine headache 12/29/2013   Menorrhagia 12/10/2012   Depression, major, recurrent, in remission ([place]) 01/12/2009   Chest pain 01/12/2009   Hypertension, benign essential, goal below 140/90 06/23/2008   History of PSVT (paroxysmal supraventricular tachycardia) 06/17/2008   Obstructive sleep  apnea 06/17/2008   GERD 06/13/2008   Past Medical History:  Diagnosis Date   Acute postoperative pain 04/07/2017   Anxiety    Bursitis    Chronic fatigue 12/12/2017   Chronic fatigue syndrome    Colitis 2021   Diabetes mellitus without complication ([place])    Edema leg 05/02/2015   Fibromyalgia    GERD (gastroesophageal reflux disease)    IBS (irritable bowel syndrome)    Knee pain, bilateral 12/21/2008   Qualifier: Diagnosis of  By: Hassell Done FNP, [place]     Lumbar discitis    Migraines    Osteoarthritis    Right hand pain 04/10/2015   [facility] has done nerve conduction studies and ruled out carpal tunnel.    Sleep apnea    Spinal stenosis    SVT (supraventricular tachycardia)    Vertigo    Vitamin D deficiency 05/01/2016    Family History  Problem Relation Age of Onset   Depression Mother    Hypertension Mother    Cancer Mother        Skin   Hyperlipidemia Mother    Anxiety disorder Mother    Migraines Mother    Alcohol abuse Father    Depression Father    Stroke Father    Heart disease Father    Hypertension Father    Anxiety disorder Father    Depression Sister    Hyperlipidemia Sister    Diabetes Sister    Hypertension Sister    Polycystic ovary syndrome Sister    Bipolar disorder Sister    Anxiety disorder Sister    Migraines Sister    Depression Sister    Hypertension Sister    Anxiety disorder Sister    Migraines Sister    Breast cancer Maternal Grandmother 61   Cancer Maternal Grandmother 63       Breast   Thyroid disease Maternal Grandmother    Arthritis Maternal Grandmother    Hyperlipidemia Maternal Grandmother    Aneurysm Maternal Grandfather    Hypertension Maternal Grandfather    Heart disease Maternal Grandfather    Alzheimer's disease Paternal Grandmother    Heart attack Paternal Grandfather    Hypertension Paternal Grandfather    COPD Paternal Grandfather    Heart disease Paternal Grandfather    Migraines Daughter     Other Daughter        leomyoa scaroma   Migraines Daughter    Migraines Son    Alzheimer's disease Other    Bladder Cancer Neg Hx    Kidney cancer Neg Hx     Past Surgical History:  Procedure Laterality Date   ABLATION     Uterine   BREAST BIOPSY Left    2014 U/S bx fibroadenoma   CARDIAC CATHETERIZATION     with ablation   COLONOSCOPY N/A 09/26/2022   Procedure: COLONOSCOPY;  Surgeon: Jonathon Bellows, MD;  Location: [facility] ENDOSCOPY;  Service: Gastroenterology;  Laterality: N/A;   COLONOSCOPY WITH PROPOFOL N/A 05/17/2015   Procedure: COLONOSCOPY WITH PROPOFOL;  Surgeon: Manya Silvas, MD;  Location: [facility] ENDOSCOPY;  Service: Endoscopy;  Laterality: N/A;   COLONOSCOPY WITH  PROPOFOL N/A 03/20/2020   Procedure: COLONOSCOPY WITH PROPOFOL;  Surgeon: Jonathon Bellows, MD;  Location: [facility] ENDOSCOPY;  Service: Gastroenterology;  Laterality: N/A;   COLONOSCOPY WITH PROPOFOL N/A 09/25/2022   Procedure: COLONOSCOPY WITH PROPOFOL;  Surgeon: Jonathon Bellows, MD;  Location: [facility] ENDOSCOPY;  Service: Gastroenterology;  Laterality: N/A;   COLONOSCOPY WITH PROPOFOL N/A 10/02/2022   Procedure: COLONOSCOPY WITH PROPOFOL;  Surgeon: Jonathon Bellows, MD;  Location: [facility] ENDOSCOPY;  Service: Gastroenterology;  Laterality: N/A;   ESOPHAGOGASTRODUODENOSCOPY N/A 05/17/2015   Procedure: ESOPHAGOGASTRODUODENOSCOPY (EGD);  Surgeon: Manya Silvas, MD;  Location: [facility] ENDOSCOPY;  Service: Endoscopy;  Laterality: N/A;   ESOPHAGOGASTRODUODENOSCOPY N/A 09/25/2022   Procedure: ESOPHAGOGASTRODUODENOSCOPY (EGD);  Surgeon: Jonathon Bellows, MD;  Location: [facility] ENDOSCOPY;  Service: Gastroenterology;  Laterality: N/A;   ESOPHAGOGASTRODUODENOSCOPY N/A 09/26/2022   Procedure: ESOPHAGOGASTRODUODENOSCOPY (EGD);  Surgeon: Jonathon Bellows, MD;  Location: [facility] ENDOSCOPY;  Service: Gastroenterology;  Laterality: N/A;   KNEE ARTHROSCOPY     spg     6/18   Tibial Tubercle Bypass Right 1998   TUBAL LIGATION  10/01/1999   Social History    Occupational History   Occupation: disbled    Comment: not able  Tobacco Use   Smoking status: Former    Packs/day: 2.00    Years: 3.00    Total pack years: 6.00    Types: Cigarettes    Quit date: 12/10/1992    Years since quitting: 29.8   Smokeless tobacco: Never   Tobacco comments:    quit 25 years ago  Vaping Use   Vaping Use: Never used  Substance and Sexual Activity   Alcohol use: No    Comment: socially   Drug use: No   Sexual activity: Not Currently    Partners: Male    Birth control/protection: Surgical

## 2022-10-14 NOTE — Patient Instructions (Signed)
____________________________________________________________________________________________  Post-Procedure Discharge Instructions  Instructions:  Apply ice:   Purpose: This will minimize any swelling and discomfort after procedure.   When: Day of procedure, as soon as you get home.  How: Fill a plastic sandwich bag with crushed ice. Cover it with a small towel and apply to injection site.  How long: (15 min on, 15 min off) Apply for 15 minutes then remove x 15 minutes.  Repeat sequence on day of procedure, until you go to bed.  Apply heat:   Purpose: To treat any soreness and discomfort from the procedure.  When: Starting the next day after the procedure.  How: Apply heat to procedure site starting the day following the procedure.  How long: May continue to repeat daily, until discomfort goes away.  Food intake: Start with clear liquids (like water) and advance to regular food, as tolerated.   Physical activities: Keep activities to a minimum for the first 8 hours after the procedure. After that, then as tolerated.  Driving: If you have received any sedation, be responsible and do not drive. You are not allowed to drive for 24 hours after having sedation.  Blood thinner: (Applies only to those taking blood thinners) You may restart your blood thinner 6 hours after your procedure.  Insulin: (Applies only to Diabetic patients taking insulin) As soon as you can eat, you may resume your normal dosing schedule.  Infection prevention: Keep procedure site clean and dry. Shower daily and clean area with soap and water.  Post-procedure Pain Diary: Extremely important that this be done correctly and accurately. Recorded information will be used to determine the next step in treatment. For the purpose of accuracy, follow these rules:  Evaluate only the area treated. Do not report or include pain from an untreated area. For the purpose of this evaluation, ignore all other areas of pain,  except for the treated area.  After your procedure, avoid taking a long nap and attempting to complete the pain diary after you wake up. Instead, set your alarm clock to go off every hour, on the hour, for the initial 8 hours after the procedure. Document the duration of the numbing medicine, and the relief you are getting from it.  Do not go to sleep and attempt to complete it later. It will not be accurate. If you received sedation, it is likely that you were given a medication that may cause amnesia. Because of this, completing the diary at a later time may cause the information to be inaccurate. This information is needed to plan your care.  Follow-up appointment: Keep your post-procedure follow-up evaluation appointment after the procedure (usually 2 weeks for most procedures, 6 weeks for radiofrequencies). DO NOT FORGET to bring you pain diary with you.   Expect: (What should I expect to see with my procedure?)  From numbing medicine (AKA: Local Anesthetics): Numbness or decrease in pain. You may also experience some weakness, which if present, could last for the duration of the local anesthetic.  Onset: Full effect within 15 minutes of injected.  Duration: It will depend on the type of local anesthetic used. On the average, 1 to 8 hours.   From steroids (Applies only if steroids were used): Decrease in swelling or inflammation. Once inflammation is improved, relief of the pain will follow.  Onset of benefits: Depends on the amount of swelling present. The more swelling, the longer it will take for the benefits to be seen. In some cases, up to 10 days.    may typically last 2 weeks (the duration of the steroids). Frequent: Cramps (if they occur, drink Gatorade and take over-the-counter Magnesium 450-500 mg once to twice a day); water retention with temporary  weight gain; increases in blood sugar; decreased immune system response; increased appetite. Occasional: Facial flushing (red, warm cheeks); mood swings; menstrual changes. Uncommon: Long-term decrease or suppression of natural hormones; bone thinning. (These are more common with higher doses or more frequent use. This is why we prefer that our patients avoid having any injection therapies in other practices.)  Very Rare: Severe mood changes; psychosis; aseptic necrosis. From procedure: Some discomfort is to be expected once the numbing medicine wears off. This should be minimal if ice and heat are applied as instructed.  Call if: (When should I call?) You experience numbness and weakness that gets worse with time, as opposed to wearing off. New onset bowel or bladder incontinence. (Applies only to procedures done in the spine)  Emergency Numbers: Durning business hours (Monday - Thursday, 8:00 AM - 4:00 PM) (Friday, 9:00 AM - 12:00 Noon): [phone] After hours: [phone] NOTE: If you are having a problem and are unable connect with, or to talk to a provider, then go to your nearest urgent care or emergency department. If the problem is serious and urgent, please call 911. ____________________________________________________________________________________________  ____________________________________________________________________________________________  Patient Information update  To: All of our patients.  Re: Name change.  It has been made official that our current name, "[facility]"   will soon be changed to "[facility]".   The purpose of this change is to eliminate any confusion created by the concept of our practice being a "Medication Management Pain Clinic". In the past this has led to the misconception that we treat pain primarily by the use of prescription  medications.  Nothing can be farther from the truth.   Understanding PAIN MANAGEMENT: To further understand what our practice does, you first have to understand that "Pain Management" is a subspecialty that requires additional training once a physician has completed their specialty training, which can be in either Anesthesia, Neurology, Psychiatry, or Physical Medicine and Rehabilitation (PMR). Each one of these contributes to the final approach taken by each physician to the management of their patient's pain. To be a "Pain Management Specialist" you must have first completed one of the specialty trainings below.  Anesthesiologists - trained in clinical pharmacology and interventional techniques such as nerve blockade and regional as well as central neuroanatomy. They are trained to block pain before, during, and after surgical interventions.  Neurologists - trained in the diagnosis and pharmacological treatment of complex neurological conditions, such as Multiple Sclerosis, Parkinson's, spinal cord injuries, and other systemic conditions that may be associated with symptoms that may include but are not limited to pain. They tend to rely primarily on the treatment of chronic pain using prescription medications.  Psychiatrist - trained in conditions affecting the psychosocial wellbeing of patients including but not limited to depression, anxiety, schizophrenia, personality disorders, addiction, and other substance use disorders that may be associated with chronic pain. They tend to rely primarily on the treatment of chronic pain using prescription medications.   Physical Medicine and Rehabilitation (PMR) physicians, also known as physiatrists - trained to treat a wide variety of medical conditions affecting the brain, spinal cord, nerves, bones, joints, ligaments, muscles, and tendons. Their training is primarily aimed at treating patients that have suffered injuries   that have caused severe physical  impairment. Their training is primarily aimed at the physical therapy and rehabilitation of those patients. They may also work alongside orthopedic surgeons or neurosurgeons using their expertise in assisting surgical patients to recover after their surgeries.  INTERVENTIONAL PAIN MANAGEMENT is sub-subspecialty of Pain Management.  Our physicians are Board-certified in Anesthesia, Pain Management, and Interventional Pain Management.  This meaning that not only have they been trained and Board-certified in their specialty of Anesthesia, and subspecialty of Pain Management, but they have also received further training in the sub-subspecialty of Interventional Pain Management, in order to become Board-certified as INTERVENTIONAL PAIN MANAGEMENT SPECIALIST.    Mission: Our goal is to use our skills in  INTERVENTIONAL PAIN MANAGEMENT as alternatives to the chronic use of prescription opioid medications for the treatment of pain. To make this more clear, we have changed our name to reflect what we do and offer. We will continue to offer medication management assessment and recommendations, but we will not be taking over any patient's medication management.  ____________________________________________________________________________________________   

## 2022-10-15 ENCOUNTER — Ambulatory Visit: Payer: Medicaid Other | Attending: Pain Medicine | Admitting: Pain Medicine

## 2022-10-15 DIAGNOSIS — Z538 Procedure and treatment not carried out for other reasons: Secondary | ICD-10-CM

## 2022-10-15 NOTE — Progress Notes (Signed)
(  10/15/2022) right L3-4 TFESI #1 rescheduled secondary to the patient having recently had a steroid injection by a different practitioner, approximately 1 week ago.

## 2022-10-18 DIAGNOSIS — G4733 Obstructive sleep apnea (adult) (pediatric): Secondary | ICD-10-CM | POA: Diagnosis not present

## 2022-10-21 DIAGNOSIS — M5416 Radiculopathy, lumbar region: Secondary | ICD-10-CM | POA: Insufficient documentation

## 2022-10-21 DIAGNOSIS — M797 Fibromyalgia: Secondary | ICD-10-CM | POA: Diagnosis not present

## 2022-10-25 ENCOUNTER — Encounter: Payer: Medicaid Other | Admitting: Physical Medicine and Rehabilitation

## 2022-10-25 ENCOUNTER — Encounter: Payer: Self-pay | Admitting: Physical Medicine and Rehabilitation

## 2022-10-25 VITALS — BP 120/84 | HR 91 | Ht 63.0 in | Wt 231.0 lb

## 2022-10-25 DIAGNOSIS — F329 Major depressive disorder, single episode, unspecified: Secondary | ICD-10-CM | POA: Diagnosis not present

## 2022-10-25 DIAGNOSIS — K56609 Unspecified intestinal obstruction, unspecified as to partial versus complete obstruction: Secondary | ICD-10-CM | POA: Diagnosis not present

## 2022-10-25 DIAGNOSIS — K580 Irritable bowel syndrome with diarrhea: Secondary | ICD-10-CM | POA: Diagnosis not present

## 2022-10-25 DIAGNOSIS — G43001 Migraine without aura, not intractable, with status migrainosus: Secondary | ICD-10-CM

## 2022-10-25 DIAGNOSIS — Z91018 Allergy to other foods: Secondary | ICD-10-CM | POA: Diagnosis not present

## 2022-10-25 MED ORDER — AMITRIPTYLINE HCL 100 MG PO TABS: 100.0000 mg | ORAL_TABLET | Freq: Every day | ORAL | 3 refills | Status: DC

## 2022-10-25 NOTE — Addendum Note (Signed)
Addended by: Izora Ribas on: 10/25/2022 11:36 AM   Modules accepted: Orders

## 2022-10-25 NOTE — Progress Notes (Addendum)
Subjective:    Patient ID: Dana Bradley, female    DOB: 1971-01-04, 52 y.o.   MRN: [id number]  HPI    1) Chronic Migraine:  -Dana Bradley a 52 year old woman who presents for f/u of her chronic migraine  -migraines started to get back to every day and very severe -she cannot be in light -on Monday she had to wear an eye mask in a darkened room. -Migraines will not get better unless she takes the Fioricet with the maxaalt but it puts her to sleepy -she feels she is getting a migraine every day -the Fioricet and Nurtec or Maxaalt together give her relief, but not complete relief.  -the days run into each other and its hard to remember the differences between them -she has been stressed in caring for her mother -she has noted recently that when she lays flat she feels fine but as soon as she sits up she feels an excruciating migraine -migraine continues to worsen from sitting to standing position -her daughter has similar symptoms but felt better after starting acetazolamide for presumed diagnosis of intracranial hypertension. She asks whether she may benefit from this medication as well.  Terrible stomach spasms and diarrhea. 12 hours later debilitating migraine. It usually starts in the afternoon. She feels that this may be part of her prodrome. She also has terrible vertigo that is getting worse. The Fiorcet helps a lot, the Migronal helps with the frontal headache. These make the pain bearable.  -having migraines close to every 24H -she is not functional -she is not sure if Maxaalt is helping. She is not taking with the Nurtec. It is helping some but she is not sure how much. The fioricet alone helps by putting her to sleep.  -she has not heart any news about the Vypeti infusion. The medication was approved  -has been having more dizziness.  -the pain has been worst since she had the occipital nerve blocks.  -she knows it is stress related -she feels a pain radiating down her arm as  well. It also runs into her head and into her face.  -Sometimes her whole left side is fizzy.  -she has tried Amitriptyline.  -have been awful since December. Had a migraine from Dec 1st throughout 9th.  -the day before she was incapacitated.  -Vyepti has been approved -The nurtec and the Fiorocet help together to break the migraine.  -she needs a refill of her Fioricet and nausea, she finds this helpful.  -Nurtec- she is still working with the company to get this improved -she asks about prodrome and postdrome (she experiences cramping and diarrhea).  -on Sunday she closed out a 56 hour migraines. -she has been trying to follow ketogenic diet.  -Nurtec helping- 5 days in a row without a migraines.  -she feels if she could take it as a rescue medication in addition to every other day this may help.   2) Vertigo -Symptoms are worsening.  -She feels that this may be leading up to the vertigo.  -She is interested in vestibular therapy.  -she had a heating pad on the shoulder and arm.   3) Cervical myofasical pain syndrome: -Muscles of neck are very tight, pain is worse on the left side.  -She did not find spinal injections helpful, they made the pain worse.   4) Insomnia: she sleeps in chunks -she is willing to increase amitriptyline to '50mg'$ .   5) Cognitive impairments: -she loves to cook and noticed  she has been making more mistakes -she thinks the headaches are a problem.  6) Pain radiating from back into legs -feels twitches -worse at night -legs kick out at night.   7) IBS -terrible in the night -2am on Sunday. -horrible pain -often with diarrhea and improves symptoms after she has diarrhea.  -cramping.  -last normal BM is 1/9 -having abdominal distension -magnesium citrate does not help -miralax multiple times per day do not help  8) Stress  9) depression: -she has dry mouth will wellbutrin and did not find much efficacy. She asked if there is an alternative  medication she can try -she had tried Zoloft in the past with benefits -she is going to pick up the Zoloft today and try it tonight -she would be willing to take vitamin D  Dana Bradley is a 53 year old woman who presents for follow-up of her chronic migraine. Her average pain is 6/10 and pain right now is 6/10. Her pain is intermittent, sharp, burning, stabbing, tingling, and aching.   She recently had 7 days without migraine. She is not sure what was associated with this. She had a pretty terrible bounceback.  She picked up the Migranal nasal spray. Our CMA explained how it works.   Her pain doctor is going to stop prescribing the Lyrica and she needs to find a new doctor to prescribe this.    Pain Inventory Average Pain 5 Pain Right Now 6 My pain is constant, sharp, burning, stabbing, tingling, and aching  In the last 24 hours, has pain interfered with the following? General activity 7 Relation with others 8 Enjoyment of life 8 What TIME of day is your pain at its worst? morning , daytime, evening, and night Sleep (in general) Poor  Pain is worse with: walking, bending, and some activites Pain improves with: medication Relief from Meds: 5     Family History  Problem Relation Age of Onset   Depression Mother    Hypertension Mother    Cancer Mother        Skin   Hyperlipidemia Mother    Anxiety disorder Mother    Migraines Mother    Alcohol abuse Father    Depression Father    Stroke Father    Heart disease Father    Hypertension Father    Anxiety disorder Father    Depression Sister    Hyperlipidemia Sister    Diabetes Sister    Hypertension Sister    Polycystic ovary syndrome Sister    Bipolar disorder Sister    Anxiety disorder Sister    Migraines Sister    Depression Sister    Hypertension Sister    Anxiety disorder Sister    Migraines Sister    Breast cancer Maternal Grandmother 64   Cancer Maternal Grandmother 63       Breast   Thyroid disease  Maternal Grandmother    Arthritis Maternal Grandmother    Hyperlipidemia Maternal Grandmother    Aneurysm Maternal Grandfather    Hypertension Maternal Grandfather    Heart disease Maternal Grandfather    Alzheimer's disease Paternal Grandmother    Heart attack Paternal Grandfather    Hypertension Paternal Grandfather    COPD Paternal Grandfather    Heart disease Paternal Grandfather    Migraines Daughter    Other Daughter        leomyoa scaroma   Migraines Daughter    Migraines Son    Alzheimer's disease Other    Bladder Cancer Neg Hx  Kidney cancer Neg Hx    Social History   Socioeconomic History   Marital status: Married    Spouse name: brian   Number of children: 3   Years of education: Not on file   Highest education level: Associate degree: academic program  Occupational History   Occupation: disbled    Comment: not able  Tobacco Use   Smoking status: Former    Packs/day: 2.00    Years: 3.00    Total pack years: 6.00    Types: Cigarettes    Quit date: 12/10/1992    Years since quitting: 29.8   Smokeless tobacco: Never   Tobacco comments:    quit 25 years ago  Vaping Use   Vaping Use: Never used  Substance and Sexual Activity   Alcohol use: No    Comment: socially   Drug use: No   Sexual activity: Not Currently    Partners: Male    Birth control/protection: Surgical  Other Topics Concern   Not on file  Social History Narrative   Lives at home with her husband and 2 of her children   Right handed   Caffeine: 0-2 cups daily   Social Determinants of Health   Financial Resource Strain: High Risk (08/29/2017)   Overall Financial Resource Strain (CARDIA)    Difficulty of Paying Living Expenses: Very hard  Food Insecurity: Food Insecurity Present (08/29/2017)   Hunger Vital Sign    Worried About Running Out of Food in the Last Year: Often true    Ran Out of Food in the Last Year: Often true  Transportation Needs: Unmet Transportation Needs  (08/29/2017)   PRAPARE - Hydrologist (Medical): Yes    Lack of Transportation (Non-Medical): Yes  Physical Activity: Inactive (08/29/2017)   Exercise Vital Sign    Days of Exercise per Week: 0 days    Minutes of Exercise per Session: 0 min  Stress: Stress Concern Present (08/29/2017)   [place] of Stress : Rather much  Social Connections: Somewhat Isolated (08/29/2017)   Social Connection and Isolation Panel [NHANES]    Frequency of Communication with Friends and Family: Never    Frequency of Social Gatherings with Friends and Family: Never    Attends Religious Services: More than 4 times per year    Active Member of Clubs or Organizations: No    Attends Archivist Meetings: Never    Marital Status: Married   Past Surgical History:  Procedure Laterality Date   ABLATION     Uterine   BREAST BIOPSY Left    2014 U/S bx fibroadenoma   CARDIAC CATHETERIZATION     with ablation   COLONOSCOPY N/A 09/26/2022   Procedure: COLONOSCOPY;  Surgeon: Jonathon Bellows, MD;  Location: [facility] ENDOSCOPY;  Service: Gastroenterology;  Laterality: N/A;   COLONOSCOPY WITH PROPOFOL N/A 05/17/2015   Procedure: COLONOSCOPY WITH PROPOFOL;  Surgeon: Manya Silvas, MD;  Location: [facility] ENDOSCOPY;  Service: Endoscopy;  Laterality: N/A;   COLONOSCOPY WITH PROPOFOL N/A 03/20/2020   Procedure: COLONOSCOPY WITH PROPOFOL;  Surgeon: Jonathon Bellows, MD;  Location: [facility] ENDOSCOPY;  Service: Gastroenterology;  Laterality: N/A;   COLONOSCOPY WITH PROPOFOL N/A 09/25/2022   Procedure: COLONOSCOPY WITH PROPOFOL;  Surgeon: Jonathon Bellows, MD;  Location: [facility] ENDOSCOPY;  Service: Gastroenterology;  Laterality: N/A;   COLONOSCOPY WITH PROPOFOL N/A 10/02/2022   Procedure: COLONOSCOPY WITH PROPOFOL;  Surgeon: Jonathon Bellows, MD;  Location: [facility]  ENDOSCOPY;  Service: Gastroenterology;  Laterality: N/A;    ESOPHAGOGASTRODUODENOSCOPY N/A 05/17/2015   Procedure: ESOPHAGOGASTRODUODENOSCOPY (EGD);  Surgeon: Manya Silvas, MD;  Location: [facility] ENDOSCOPY;  Service: Endoscopy;  Laterality: N/A;   ESOPHAGOGASTRODUODENOSCOPY N/A 09/25/2022   Procedure: ESOPHAGOGASTRODUODENOSCOPY (EGD);  Surgeon: Jonathon Bellows, MD;  Location: [facility] ENDOSCOPY;  Service: Gastroenterology;  Laterality: N/A;   ESOPHAGOGASTRODUODENOSCOPY N/A 09/26/2022   Procedure: ESOPHAGOGASTRODUODENOSCOPY (EGD);  Surgeon: Jonathon Bellows, MD;  Location: [facility] ENDOSCOPY;  Service: Gastroenterology;  Laterality: N/A;   KNEE ARTHROSCOPY     spg     6/18   Tibial Tubercle Bypass Right 1998   TUBAL LIGATION  10/01/1999   Past Surgical History:  Procedure Laterality Date   ABLATION     Uterine   BREAST BIOPSY Left    2014 U/S bx fibroadenoma   CARDIAC CATHETERIZATION     with ablation   COLONOSCOPY N/A 09/26/2022   Procedure: COLONOSCOPY;  Surgeon: Jonathon Bellows, MD;  Location: [facility] ENDOSCOPY;  Service: Gastroenterology;  Laterality: N/A;   COLONOSCOPY WITH PROPOFOL N/A 05/17/2015   Procedure: COLONOSCOPY WITH PROPOFOL;  Surgeon: Manya Silvas, MD;  Location: [facility] ENDOSCOPY;  Service: Endoscopy;  Laterality: N/A;   COLONOSCOPY WITH PROPOFOL N/A 03/20/2020   Procedure: COLONOSCOPY WITH PROPOFOL;  Surgeon: Jonathon Bellows, MD;  Location: [facility] ENDOSCOPY;  Service: Gastroenterology;  Laterality: N/A;   COLONOSCOPY WITH PROPOFOL N/A 09/25/2022   Procedure: COLONOSCOPY WITH PROPOFOL;  Surgeon: Jonathon Bellows, MD;  Location: [facility] ENDOSCOPY;  Service: Gastroenterology;  Laterality: N/A;   COLONOSCOPY WITH PROPOFOL N/A 10/02/2022   Procedure: COLONOSCOPY WITH PROPOFOL;  Surgeon: Jonathon Bellows, MD;  Location: [facility] ENDOSCOPY;  Service: Gastroenterology;  Laterality: N/A;   ESOPHAGOGASTRODUODENOSCOPY N/A 05/17/2015   Procedure: ESOPHAGOGASTRODUODENOSCOPY (EGD);  Surgeon: Manya Silvas, MD;  Location: [facility] ENDOSCOPY;  Service: Endoscopy;  Laterality: N/A;    ESOPHAGOGASTRODUODENOSCOPY N/A 09/25/2022   Procedure: ESOPHAGOGASTRODUODENOSCOPY (EGD);  Surgeon: Jonathon Bellows, MD;  Location: [facility] ENDOSCOPY;  Service: Gastroenterology;  Laterality: N/A;   ESOPHAGOGASTRODUODENOSCOPY N/A 09/26/2022   Procedure: ESOPHAGOGASTRODUODENOSCOPY (EGD);  Surgeon: Jonathon Bellows, MD;  Location: [facility] ENDOSCOPY;  Service: Gastroenterology;  Laterality: N/A;   KNEE ARTHROSCOPY     spg     6/18   Tibial Tubercle Bypass Right 1998   TUBAL LIGATION  10/01/1999   Past Medical History:  Diagnosis Date   Acute postoperative pain 04/07/2017   Anxiety    Bursitis    Chronic fatigue 12/12/2017   Chronic fatigue syndrome    Colitis 2021   Diabetes mellitus without complication ([place])    Edema leg 05/02/2015   Fibromyalgia    GERD (gastroesophageal reflux disease)    IBS (irritable bowel syndrome)    Knee pain, bilateral 12/21/2008   Qualifier: Diagnosis of  By: Hassell Done FNP, [place]     Lumbar discitis    Migraines    Osteoarthritis    Right hand pain 04/10/2015   [facility] has done nerve conduction studies and ruled out carpal tunnel.    Sleep apnea    Spinal stenosis    SVT (supraventricular tachycardia)    Vertigo    Vitamin D deficiency 05/01/2016   BP 120/84   Pulse 91   Ht '5\' 3"'$  (1.6 m)   Wt 231 lb (104.8 kg)   LMP 10/22/2021 (Approximate)   SpO2 95%   BMI 40.92 kg/m   Opioid Risk Score:   Fall Risk Score:  `1  Depression screen [facility] 2/9     10/25/2022   11:05 AM 10/01/2022  11:21 AM 09/18/2022   10:43 AM 09/06/2022    8:59 AM 06/07/2022    9:25 AM 05/27/2022    9:45 AM 04/04/2022    1:35 PM  Depression screen PHQ 2/9  Decreased Interest 1 2 0 0 0 0 2  Down, Depressed, Hopeless 1 3 0 0 0 0 2  PHQ - 2 Score 2 5 0 0 0 0 4  Altered sleeping  2  0   2  Tired, decreased energy  1  0   3  Change in appetite  0  0   0  Feeling bad or failure about yourself   0  0   2  Trouble concentrating  0  0   2  Moving slowly or fidgety/restless  0  0   0   Suicidal thoughts  0  0   0  PHQ-9 Score  8  0   13  Difficult doing work/chores  Somewhat difficult  Not difficult at all   Somewhat difficult   Review of Systems  Constitutional: Negative.   HENT: Negative.    Eyes: Negative.   Respiratory: Negative.    Cardiovascular: Negative.   Gastrointestinal: Negative.   Endocrine: Negative.   Genitourinary: Negative.   Musculoskeletal:  Positive for back pain.       Left side of body from head to toes  Skin: Negative.   Allergic/Immunologic: Negative.   Neurological:  Positive for dizziness and headaches.  Hematological: Negative.   Psychiatric/Behavioral: Negative.    All other systems reviewed and are negative.      Objective:   Physical Exam Gen: no distress, normal appearing HEENT: oral mucosa pink and moist, NCAT Cardio: Reg rate Chest: normal effort, normal rate of breathing Abd: distended Ext: no edema Psych: pleasant, normal affect Skin: intact Neuro: Alert and oriented x3     Assessment & Plan:  Dana Bradley is a 52 year old woman who presents for f/u with chronic intractable migraines s/p numerous treatments, severe fibromyalgia, and IBS, and nausea, and vertigo.     1) Vertigo: In the future, restart for cervical myofascial pain syndrome: myofascial release, postural correction, stretching and strengthening of the muscles of the neck and upper back, development of HEP. Conitnue heating pads to muscles of upper back and neck. She is doing HEP.  -referred for vestibular therapy.  -discussed current symptoms.  -discussed response to meclizine -discussed worsening symptoms when in car   2) Migraines: Continue Fioricet, which is one of the medications that helps her. Refilled today. She takes this at least once every time she gets a migraine. Advised to use upon migraine onset and to not use more frequently than q6H during migraine. Refilled Zofran for nausea last week, and this has been helping her. Continue metoprolol  which can be helpful in migraine prophylaxis (HR is well controlled). Prescribed ergot nasal spray to try upon migraine initiation- advised no more frequently than 4 sprays per hour (still awaiting on prior auth). Discussed avoiding foods that may trigger migraines.  Failed vypeti, ajovy, emgality -discussed lumbar puncture Does get some benefit from [place] in combination -takes metoprolol -discussed increasing Fioricet or Amitriptyline.  -recommended drinking a glass of water every morning before standing up as her symptoms sound like orthostatic hypotension which could worsen migraines -recommended checking blood pressure daily in supine, sitting, and standing positions and this should help Bradley identify if symptoms are from orthostatic hypotension or intracranial hypertension -discussed trying daily water first before  trying medication acetazolamide.  -continue vitamin D  -Discussed that I can provide refill for her Lyrica when she needs it.  -topamax was not effective.  -When she gets the migraines her loss of words is getting worse.  -Ordered Vyepti- she will find out cost from her pharmacy. She will bring paperwork for this next visit. She is excited to try it.  -d/c nurtec since switched to maxalt -continue ear piercing. Pain feels more like pressure and less acidic.  -no benefits from Botox.  -continue to track migraines.  -Continue Baclofen for pain relief. Minimize use of Hydrocodone. She is only taking Lyrica once per day to make it last. Continue Migranal which is helping. Will occipital nerve block -Discussed that Dana Bradley's greatest source of happiness is her family. This community will be essential in helping her recover from her chronic pain and to increase her daily activity. Her daughter is also suffering from similar migraines unfortunately.  -Continue ginger, herbs, turmeric, blueberries, eating real food. Continue cutting down sugar. Using local honey is a great  alternative. -Provided with a pain relief journal and discussed that it contains foods and lifestyle tips to naturally help to improve pain. Discussed that these lifestyle strategies are also very good for health unlike some medications which can have negative side effects. Discussed that the act of keeping a journal can be therapeutic and helpful to realize patterns what helps to trigger and alleviate pain.   -Continue Nurtec every other day- may use for breakthrough migraines as well.  -Discussed plan for Vypeti at [facility] infusion center.  Discussed that exercise is one of the most effective treatments for fibromyalgia. This will also help with her obesity. Made goal with Dana Bradley to walk outside her home at least once per day, and to garden at least once per day (her favorite activity). Can use elliptical which she has at home on rainy days. The heat has been oppressive and so she has been trying to do the latter more.  Foods to alleviate migraine:  1) dark leafy greens 2) avocado 3) tuna 4) samon and mackerel 5) beans and legumes Supplements that can be helpful: feverfew, B12, and magnesium  Foods to avoid in migraine: 1) Excessive (or irregular timing) coffee 2) red wine 3) aged cheeses 4) chocolate 5) citrus fruits 6) aspartame and other artifical sweeteners 7) yeast 8) MSG (in processed foods) 9) processed and cured meats 10) nuts and certain seeds 11) chicken livers and other organ meats 12) dairy products like buttermilk, sour cream, and yogurt 13) dried fruits like dates, figs, and raisins 14) garlic 15) onions 16) potato chips 17) pickled foods like olives and sauerkraut 18) some fresh fruits like ripe banana, papaya, red plums, raspberries, kiwi, pineapple 19) tomato-based products  Recommend to keep a migraine diary: rate daily the severity of your headache (1-10) and what foods you eat that day to help determine patterns.    3) Cervical facet arthrosis:  Cervical XR normal- discussed results with patient. Pain is worse on left side.   4) Anxiety and depression -Her daughter was recently diagnosed with sarcoma and this is a great source of stress and fear to Dana Bradley. It has been a turbulent time for her and this has understandably worsened her symptoms. Discussed benefits of gratitude journaling and she plans to try this.  -discussed her life stressor, her mom's illness -discussed her difficult wean off Pristiq -prescribed zoloft -stop Wellbutrin -prescribed vitamin D -recommended Bioptemizer's magnesium breakthrough -recommended  methylated multivitamin -discussed following with Dr. Sima Matas.   5) Insomnia:  -continue amitirptyline to '50mg'$ .  -Try to go outside near sunrise -Get exercise during the day.  -Discussed good sleep hygiene: turning off all devices an hour before bedtime.  -Chamomile tea with dinner.  -Can consider over the counter melatonin -discussed magnesium  6) Nausea:  -continue zofran, discussed side effect of constipation -start B6 -discussed scopolamine patch, discussed to place behind ear and lave on 72 hours.    7) Cervical myofascial pain syndrome: Will try some trigger point injections with occipital nerve block next visit.  -increase amitriptyline to '100mg'$  HS  8) Concern for small bowel obstruction: -stat abdominal MRI ordered given no BM since 1/9.  -recommended to go to [facility] to get this done stat -discussed that it would be beneficial to go to the ED.   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 Letta Pate for ESI unchanged. Shallow broad-based disc protrusion L4-5 unchanged. -continue Norco -continue turmeric Turmeric to reduce inflammation--can be used in cooking  or taken as a supplement.  Benefits of turmeric:  -Highly anti-inflammatory  -Increases antioxidants  -Improves memory, attention, brain disease  -Lowers risk of heart disease  -May help prevent cancer  -Decreases pain  -Alleviates depression  -Delays aging and decreases risk of chronic disease  -Consume with black pepper to increase absorption    Turmeric Milk Recipe:  1 cup milk  1 tsp turmeric  1 tsp cinnamon  1 tsp grated ginger (optional)  Black pepper (boosts the anti-inflammatory properties of turmeric).  1 tsp honey

## 2022-10-28 ENCOUNTER — Encounter: Payer: Self-pay | Admitting: Gastroenterology

## 2022-10-28 ENCOUNTER — Encounter: Payer: Medicaid Other | Admitting: Physical Medicine and Rehabilitation

## 2022-10-28 ENCOUNTER — Ambulatory Visit: Payer: Medicaid Other | Admitting: Gastroenterology

## 2022-10-28 ENCOUNTER — Other Ambulatory Visit: Payer: Self-pay

## 2022-10-28 VITALS — BP 132/82 | HR 102 | Temp 98.7°F | Ht 63.0 in | Wt 229.5 lb

## 2022-10-28 DIAGNOSIS — K625 Hemorrhage of anus and rectum: Secondary | ICD-10-CM

## 2022-10-28 DIAGNOSIS — Z91018 Allergy to other foods: Secondary | ICD-10-CM

## 2022-10-28 DIAGNOSIS — R935 Abnormal findings on diagnostic imaging of other abdominal regions, including retroperitoneum: Secondary | ICD-10-CM

## 2022-10-28 LAB — ALLERGENS (22) FOODS IGG
Casein IgG: 2.2 ug/mL — ABNORMAL HIGH (ref 0.0–1.9)
Cheese, Mold Type IgG: 7.8 ug/mL — ABNORMAL HIGH (ref 0.0–1.9)
Chicken IgG: <2 ug/mL (ref 0.0–1.9)
Chili Pepper IgG: 4.9 ug/mL — ABNORMAL HIGH (ref 0.0–1.9)
Chocolate/Cacao IgG: <2 ug/mL (ref 0.0–1.9)
Coffee IgG: <2 ug/mL (ref 0.0–1.9)
Corn IgG: 4 ug/mL — ABNORMAL HIGH (ref 0.0–1.9)
Egg, Whole IgG: 8.5 ug/mL — ABNORMAL HIGH (ref 0.0–1.9)
Green Bean IgG: 4.3 ug/mL — ABNORMAL HIGH (ref 0.0–1.9)
Haddock IgG: <2 ug/mL (ref 0.0–1.9)
Lamb IgG: <2 ug/mL (ref 0.0–1.9)
Oat IgG: 3.4 ug/mL — ABNORMAL HIGH (ref 0.0–1.9)
Onion IgG: 2.7 ug/mL — ABNORMAL HIGH (ref 0.0–1.9)
Peanut IgG: <2 ug/mL (ref 0.0–1.9)
Pork IgG: <2 ug/mL (ref 0.0–1.9)
Potato, White, IgG: <2 ug/mL (ref 0.0–1.9)
Rye IgG: 2.8 ug/mL — ABNORMAL HIGH (ref 0.0–1.9)
Shrimp IgG: <2 ug/mL (ref 0.0–1.9)
Soybean IgG: <2 ug/mL (ref 0.0–1.9)
Tomato IgG: <2 ug/mL (ref 0.0–1.9)
Wheat IgG: <2 ug/mL (ref 0.0–1.9)
Yeast IgG: <2 ug/mL (ref 0.0–1.9)

## 2022-10-28 MED ORDER — PEG 3350-KCL-NA BICARB-NACL 420 G PO SOLR: ORAL | 0 refills | Status: DC

## 2022-10-28 NOTE — Progress Notes (Unsigned)
Subjective:    Patient ID: Dana Bradley, female    DOB: 1970/11/30, 52 y.o.   MRN: [id number]  HPI    1) Chronic Migraine:  -Dana Bradley a 52 year old woman who presents for f/u of her chronic migraine  -migraines started to get back to every day and very severe -she cannot be in light -on Monday she had to wear an eye mask in a darkened room. -Migraines will not get better unless she takes the Fioricet with the maxaalt but it puts her to sleepy -she feels she is getting a migraine every day -the Fioricet and Nurtec or Maxaalt together give her relief, but not complete relief.  -the days run into each other and its hard to remember the differences between them -she has been stressed in caring for her mother -she has noted recently that when she lays flat she feels fine but as soon as she sits up she feels an excruciating migraine -migraine continues to worsen from sitting to standing position -her daughter has similar symptoms but felt better after starting acetazolamide for presumed diagnosis of intracranial hypertension. She asks whether she may benefit from this medication as well.  Terrible stomach spasms and diarrhea. 12 hours later debilitating migraine. It usually starts in the afternoon. She feels that this may be part of her prodrome. She also has terrible vertigo that is getting worse. The Fiorcet helps a lot, the Migronal helps with the frontal headache. These make the pain bearable.  -having migraines close to every 24H -she is not functional -she is not sure if Maxaalt is helping. She is not taking with the Nurtec. It is helping some but she is not sure how much. The fioricet alone helps by putting her to sleep.  -she has not heart any news about the Vypeti infusion. The medication was approved  -has been having more dizziness.  -the pain has been worst since she had the occipital nerve blocks.  -she knows it is stress related -she feels a pain radiating down her arm as  well. It also runs into her head and into her face.  -Sometimes her whole left side is fizzy.  -she has tried Amitriptyline.  -have been awful since December. Had a migraine from Dec 1st throughout 9th.  -the day before she was incapacitated.  -Vyepti has been approved -The nurtec and the Fiorocet help together to break the migraine.  -she needs a refill of her Fioricet and nausea, she finds this helpful.  -Nurtec- she is still working with the company to get this improved -she asks about prodrome and postdrome (she experiences cramping and diarrhea).  -on Sunday she closed out a 56 hour migraines. -she has been trying to follow ketogenic diet.  -Nurtec helping- 5 days in a row without a migraines.  -she feels if she could take it as a rescue medication in addition to every other day this may help.   2) Vertigo -Symptoms are worsening.  -She feels that this may be leading up to the vertigo.  -She is interested in vestibular therapy.  -she had a heating pad on the shoulder and arm.   3) Cervical myofasical pain syndrome: -Muscles of neck are very tight, pain is worse on the left side.  -She did not find spinal injections helpful, they made the pain worse.   4) Insomnia: she sleeps in chunks -she is willing to increase amitriptyline to '50mg'$ .   5) Cognitive impairments: -she loves to cook and noticed  she has been making more mistakes -she thinks the headaches are a problem.  6) Pain radiating from back into legs -feels twitches -worse at night -legs kick out at night.   7) IBS -terrible in the night -2am on Sunday. -horrible pain -often with diarrhea and improves symptoms after she has diarrhea.  -cramping.  -last normal BM is 1/9 -having abdominal distension -magnesium citrate does not help -miralax multiple times per day do not help  8) Stress  9) depression: -she has dry mouth will wellbutrin and did not find much efficacy. She asked if there is an alternative  medication she can try -she had tried Zoloft in the past with benefits -she is going to pick up the Zoloft today and try it tonight -she would be willing to take vitamin D  Dana Bradley is a 52 year old woman who presents for follow-up of her chronic migraine. Her average pain is 6/10 and pain right now is 6/10. Her pain is intermittent, sharp, burning, stabbing, tingling, and aching.   She recently had 7 days without migraine. She is not sure what was associated with this. She had a pretty terrible bounceback.  She picked up the Migranal nasal spray. Our CMA explained how it works.   Her pain doctor is going to stop prescribing the Lyrica and she needs to find a new doctor to prescribe this.    Pain Inventory Average Pain 5 Pain Right Now 6 My pain is constant, sharp, burning, stabbing, tingling, and aching  In the last 24 hours, has pain interfered with the following? General activity 7 Relation with others 8 Enjoyment of life 8 What TIME of day is your pain at its worst? morning , daytime, evening, and night Sleep (in general) Poor  Pain is worse with: walking, bending, and some activites Pain improves with: medication Relief from Meds: 5     Family History  Problem Relation Age of Onset   Depression Mother    Hypertension Mother    Cancer Mother        Skin   Hyperlipidemia Mother    Anxiety disorder Mother    Migraines Mother    Alcohol abuse Father    Depression Father    Stroke Father    Heart disease Father    Hypertension Father    Anxiety disorder Father    Depression Sister    Hyperlipidemia Sister    Diabetes Sister    Hypertension Sister    Polycystic ovary syndrome Sister    Bipolar disorder Sister    Anxiety disorder Sister    Migraines Sister    Depression Sister    Hypertension Sister    Anxiety disorder Sister    Migraines Sister    Breast cancer Maternal Grandmother 42   Cancer Maternal Grandmother 63       Breast   Thyroid disease  Maternal Grandmother    Arthritis Maternal Grandmother    Hyperlipidemia Maternal Grandmother    Aneurysm Maternal Grandfather    Hypertension Maternal Grandfather    Heart disease Maternal Grandfather    Alzheimer's disease Paternal Grandmother    Heart attack Paternal Grandfather    Hypertension Paternal Grandfather    COPD Paternal Grandfather    Heart disease Paternal Grandfather    Migraines Daughter    Other Daughter        leomyoa scaroma   Migraines Daughter    Migraines Son    Alzheimer's disease Other    Bladder Cancer Neg Hx  Kidney cancer Neg Hx    Social History   Socioeconomic History   Marital status: Married    Spouse name: brian   Number of children: 3   Years of education: Not on file   Highest education level: Associate degree: academic program  Occupational History   Occupation: disbled    Comment: not able  Tobacco Use   Smoking status: Former    Packs/day: 2.00    Years: 3.00    Total pack years: 6.00    Types: Cigarettes    Quit date: 12/10/1992    Years since quitting: 29.9   Smokeless tobacco: Never   Tobacco comments:    quit 25 years ago  Vaping Use   Vaping Use: Never used  Substance and Sexual Activity   Alcohol use: No    Comment: socially   Drug use: No   Sexual activity: Not Currently    Partners: Male    Birth control/protection: Surgical  Other Topics Concern   Not on file  Social History Narrative   Lives at home with her husband and 2 of her children   Right handed   Caffeine: 0-2 cups daily   Social Determinants of Health   Financial Resource Strain: High Risk (08/29/2017)   Overall Financial Resource Strain (CARDIA)    Difficulty of Paying Living Expenses: Very hard  Food Insecurity: Food Insecurity Present (08/29/2017)   Hunger Vital Sign    Worried About Running Out of Food in the Last Year: Often true    Ran Out of Food in the Last Year: Often true  Transportation Needs: Unmet Transportation Needs  (08/29/2017)   PRAPARE - Hydrologist (Medical): Yes    Lack of Transportation (Non-Medical): Yes  Physical Activity: Inactive (08/29/2017)   Exercise Vital Sign    Days of Exercise per Week: 0 days    Minutes of Exercise per Session: 0 min  Stress: Stress Concern Present (08/29/2017)   [place] of Stress : Rather much  Social Connections: Somewhat Isolated (08/29/2017)   Social Connection and Isolation Panel [NHANES]    Frequency of Communication with Friends and Family: Never    Frequency of Social Gatherings with Friends and Family: Never    Attends Religious Services: More than 4 times per year    Active Member of Clubs or Organizations: No    Attends Archivist Meetings: Never    Marital Status: Married   Past Surgical History:  Procedure Laterality Date   ABLATION     Uterine   BREAST BIOPSY Left    2014 U/S bx fibroadenoma   CARDIAC CATHETERIZATION     with ablation   COLONOSCOPY N/A 09/26/2022   Procedure: COLONOSCOPY;  Surgeon: Jonathon Bellows, MD;  Location: [facility] ENDOSCOPY;  Service: Gastroenterology;  Laterality: N/A;   COLONOSCOPY WITH PROPOFOL N/A 05/17/2015   Procedure: COLONOSCOPY WITH PROPOFOL;  Surgeon: Manya Silvas, MD;  Location: [facility] ENDOSCOPY;  Service: Endoscopy;  Laterality: N/A;   COLONOSCOPY WITH PROPOFOL N/A 03/20/2020   Procedure: COLONOSCOPY WITH PROPOFOL;  Surgeon: Jonathon Bellows, MD;  Location: [facility] ENDOSCOPY;  Service: Gastroenterology;  Laterality: N/A;   COLONOSCOPY WITH PROPOFOL N/A 09/25/2022   Procedure: COLONOSCOPY WITH PROPOFOL;  Surgeon: Jonathon Bellows, MD;  Location: [facility] ENDOSCOPY;  Service: Gastroenterology;  Laterality: N/A;   COLONOSCOPY WITH PROPOFOL N/A 10/02/2022   Procedure: COLONOSCOPY WITH PROPOFOL;  Surgeon: Jonathon Bellows, MD;  Location: [facility]  ENDOSCOPY;  Service: Gastroenterology;  Laterality: N/A;    ESOPHAGOGASTRODUODENOSCOPY N/A 05/17/2015   Procedure: ESOPHAGOGASTRODUODENOSCOPY (EGD);  Surgeon: Manya Silvas, MD;  Location: [facility] ENDOSCOPY;  Service: Endoscopy;  Laterality: N/A;   ESOPHAGOGASTRODUODENOSCOPY N/A 09/25/2022   Procedure: ESOPHAGOGASTRODUODENOSCOPY (EGD);  Surgeon: Jonathon Bellows, MD;  Location: [facility] ENDOSCOPY;  Service: Gastroenterology;  Laterality: N/A;   ESOPHAGOGASTRODUODENOSCOPY N/A 09/26/2022   Procedure: ESOPHAGOGASTRODUODENOSCOPY (EGD);  Surgeon: Jonathon Bellows, MD;  Location: [facility] ENDOSCOPY;  Service: Gastroenterology;  Laterality: N/A;   KNEE ARTHROSCOPY     spg     6/18   Tibial Tubercle Bypass Right 1998   TUBAL LIGATION  10/01/1999   Past Surgical History:  Procedure Laterality Date   ABLATION     Uterine   BREAST BIOPSY Left    2014 U/S bx fibroadenoma   CARDIAC CATHETERIZATION     with ablation   COLONOSCOPY N/A 09/26/2022   Procedure: COLONOSCOPY;  Surgeon: Jonathon Bellows, MD;  Location: [facility] ENDOSCOPY;  Service: Gastroenterology;  Laterality: N/A;   COLONOSCOPY WITH PROPOFOL N/A 05/17/2015   Procedure: COLONOSCOPY WITH PROPOFOL;  Surgeon: Manya Silvas, MD;  Location: [facility] ENDOSCOPY;  Service: Endoscopy;  Laterality: N/A;   COLONOSCOPY WITH PROPOFOL N/A 03/20/2020   Procedure: COLONOSCOPY WITH PROPOFOL;  Surgeon: Jonathon Bellows, MD;  Location: [facility] ENDOSCOPY;  Service: Gastroenterology;  Laterality: N/A;   COLONOSCOPY WITH PROPOFOL N/A 09/25/2022   Procedure: COLONOSCOPY WITH PROPOFOL;  Surgeon: Jonathon Bellows, MD;  Location: [facility] ENDOSCOPY;  Service: Gastroenterology;  Laterality: N/A;   COLONOSCOPY WITH PROPOFOL N/A 10/02/2022   Procedure: COLONOSCOPY WITH PROPOFOL;  Surgeon: Jonathon Bellows, MD;  Location: [facility] ENDOSCOPY;  Service: Gastroenterology;  Laterality: N/A;   ESOPHAGOGASTRODUODENOSCOPY N/A 05/17/2015   Procedure: ESOPHAGOGASTRODUODENOSCOPY (EGD);  Surgeon: Manya Silvas, MD;  Location: [facility] ENDOSCOPY;  Service: Endoscopy;  Laterality: N/A;    ESOPHAGOGASTRODUODENOSCOPY N/A 09/25/2022   Procedure: ESOPHAGOGASTRODUODENOSCOPY (EGD);  Surgeon: Jonathon Bellows, MD;  Location: [facility] ENDOSCOPY;  Service: Gastroenterology;  Laterality: N/A;   ESOPHAGOGASTRODUODENOSCOPY N/A 09/26/2022   Procedure: ESOPHAGOGASTRODUODENOSCOPY (EGD);  Surgeon: Jonathon Bellows, MD;  Location: [facility] ENDOSCOPY;  Service: Gastroenterology;  Laterality: N/A;   KNEE ARTHROSCOPY     spg     6/18   Tibial Tubercle Bypass Right 1998   TUBAL LIGATION  10/01/1999   Past Medical History:  Diagnosis Date   Acute postoperative pain 04/07/2017   Anxiety    Bursitis    Chronic fatigue 12/12/2017   Chronic fatigue syndrome    Colitis 2021   Diabetes mellitus without complication ([place])    Edema leg 05/02/2015   Fibromyalgia    GERD (gastroesophageal reflux disease)    IBS (irritable bowel syndrome)    Knee pain, bilateral 12/21/2008   Qualifier: Diagnosis of  By: Hassell Done FNP, [place]     Lumbar discitis    Migraines    Osteoarthritis    Right hand pain 04/10/2015   [facility] has done nerve conduction studies and ruled out carpal tunnel.    Sleep apnea    Spinal stenosis    SVT (supraventricular tachycardia)    Vertigo    Vitamin D deficiency 05/01/2016   LMP 10/22/2021 (Approximate)   Opioid Risk Score:   Fall Risk Score:  `1  Depression screen [facility] 2/9     10/25/2022   11:05 AM 10/01/2022   11:21 AM 09/18/2022   10:43 AM 09/06/2022    8:59 AM 06/07/2022    9:25 AM 05/27/2022    9:45 AM 04/04/2022  1:35 PM  Depression screen PHQ 2/9  Decreased Interest 1 2 0 0 0 0 2  Down, Depressed, Hopeless 1 3 0 0 0 0 2  PHQ - 2 Score 2 5 0 0 0 0 4  Altered sleeping  2  0   2  Tired, decreased energy  1  0   3  Change in appetite  0  0   0  Feeling bad or failure about yourself   0  0   2  Trouble concentrating  0  0   2  Moving slowly or fidgety/restless  0  0   0  Suicidal thoughts  0  0   0  PHQ-9 Score  8  0   13  Difficult doing work/chores  Somewhat  difficult  Not difficult at all   Somewhat difficult   Review of Systems  Constitutional: Negative.   HENT: Negative.    Eyes: Negative.   Respiratory: Negative.    Cardiovascular: Negative.   Gastrointestinal: Negative.   Endocrine: Negative.   Genitourinary: Negative.   Musculoskeletal:  Positive for back pain.       Left side of body from head to toes  Skin: Negative.   Allergic/Immunologic: Negative.   Neurological:  Positive for dizziness and headaches.  Hematological: Negative.   Psychiatric/Behavioral: Negative.    All other systems reviewed and are negative.      Objective:   Physical Exam Gen: no distress, normal appearing HEENT: oral mucosa pink and moist, NCAT Cardio: Reg rate Chest: normal effort, normal rate of breathing Abd: distended Ext: no edema Psych: pleasant, normal affect Skin: intact Neuro: Alert and oriented x3     Assessment & Plan:  Mrs. Golomb is a 52 year old woman who presents for f/u with chronic intractable migraines s/p numerous treatments, severe fibromyalgia, and IBS, and nausea, and vertigo.     1) Vertigo: In the future, restart for cervical myofascial pain syndrome: myofascial release, postural correction, stretching and strengthening of the muscles of the neck and upper back, development of HEP. Conitnue heating pads to muscles of upper back and neck. She is doing HEP.  -referred for vestibular therapy.  -discussed current symptoms.  -discussed response to meclizine -discussed worsening symptoms when in car   2) Migraines: Continue Fioricet, which is one of the medications that helps her. Refilled today. She takes this at least once every time she gets a migraine. Advised to use upon migraine onset and to not use more frequently than q6H during migraine. Refilled Zofran for nausea last week, and this has been helping her. Continue metoprolol which can be helpful in migraine prophylaxis (HR is well controlled). Prescribed ergot nasal  spray to try upon migraine initiation- advised no more frequently than 4 sprays per hour (still awaiting on prior auth). Discussed avoiding foods that may trigger migraines.  Failed vypeti, ajovy, emgality -discussed lumbar puncture Does get some benefit from Maxalt and Fioricet in combination -takes metoprolol -discussed increasing Fioricet or Amitriptyline.  -recommended drinking a glass of water every morning before standing up as her symptoms sound like orthostatic hypotension which could worsen migraines -recommended checking blood pressure daily in supine, sitting, and standing positions and this should help Bradley identify if symptoms are from orthostatic hypotension or intracranial hypertension -discussed trying daily water first before trying medication acetazolamide.  -continue vitamin D  -Discussed that I can provide refill for her Lyrica when she needs it.  -topamax was not effective.  -When she  gets the migraines her loss of words is getting worse.  -Ordered Vyepti- she will find out cost from her pharmacy. She will bring paperwork for this next visit. She is excited to try it.  -d/c nurtec since switched to maxalt -continue ear piercing. Pain feels more like pressure and less acidic.  -no benefits from Botox.  -continue to track migraines.  -Continue Baclofen for pain relief. Minimize use of Hydrocodone. She is only taking Lyrica once per day to make it last. Continue Migranal which is helping. Will occipital nerve block -Discussed that Mrs. Sedlacek's greatest source of happiness is her family. This community will be essential in helping her recover from her chronic pain and to increase her daily activity. Her daughter is also suffering from similar migraines unfortunately.  -Continue ginger, herbs, turmeric, blueberries, eating real food. Continue cutting down sugar. Using local honey is a great alternative. -Provided with a pain relief journal and discussed that it contains foods and  lifestyle tips to naturally help to improve pain. Discussed that these lifestyle strategies are also very good for health unlike some medications which can have negative side effects. Discussed that the act of keeping a journal can be therapeutic and helpful to realize patterns what helps to trigger and alleviate pain.   -Continue Nurtec every other day- may use for breakthrough migraines as well.  -Discussed plan for Vypeti at [facility] infusion center.  Discussed that exercise is one of the most effective treatments for fibromyalgia. This will also help with her obesity. Made goal with Mrs. Castillo to walk outside her home at least once per day, and to garden at least once per day (her favorite activity). Can use elliptical which she has at home on rainy days. The heat has been oppressive and so she has been trying to do the latter more.  Foods to alleviate migraine:  1) dark leafy greens 2) avocado 3) tuna 4) samon and mackerel 5) beans and legumes Supplements that can be helpful: feverfew, B12, and magnesium  Foods to avoid in migraine: 1) Excessive (or irregular timing) coffee 2) red wine 3) aged cheeses 4) chocolate 5) citrus fruits 6) aspartame and other artifical sweeteners 7) yeast 8) MSG (in processed foods) 9) processed and cured meats 10) nuts and certain seeds 11) chicken livers and other organ meats 12) dairy products like buttermilk, sour cream, and yogurt 13) dried fruits like dates, figs, and raisins 14) garlic 15) onions 16) potato chips 17) pickled foods like olives and sauerkraut 18) some fresh fruits like ripe banana, papaya, red plums, raspberries, kiwi, pineapple 19) tomato-based products  Recommend to keep a migraine diary: rate daily the severity of your headache (1-10) and what foods you eat that day to help determine patterns.    3) Cervical facet arthrosis: Cervical XR normal- discussed results with patient. Pain is worse on left side.   4) Anxiety and  depression -Her daughter was recently diagnosed with sarcoma and this is a great source of stress and fear to Mrs. [name]. It has been a turbulent time for her and this has understandably worsened her symptoms. Discussed benefits of gratitude journaling and she plans to try this.  -discussed her life stressor, her mom's illness -discussed her difficult wean off Pristiq -prescribed zoloft -stop Wellbutrin -prescribed vitamin D -recommended Bioptemizer's magnesium breakthrough -recommended methylated multivitamin -discussed following with Dr. Sima Matas.   5) Insomnia:  -continue amitirptyline to '50mg'$ .  -Try to go outside near sunrise -Get exercise during the day.  -  Discussed good sleep hygiene: turning off all devices an hour before bedtime.  -Chamomile tea with dinner.  -Can consider over the counter melatonin -discussed magnesium  6) Nausea:  -continue zofran, discussed side effect of constipation -start B6 -discussed scopolamine patch, discussed to place behind ear and lave on 72 hours.    7) Cervical myofascial pain syndrome: Will try some trigger point injections with occipital nerve block next visit.  -increase amitriptyline to '100mg'$  HS  8) Concern for small bowel obstruction: -stat abdominal MRI ordered given no BM since 1/9.  -recommended to go to [facility] to get this done stat -discussed that it would be beneficial to go to the ED.   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 Letta Pate for ESI unchanged. Shallow broad-based disc protrusion L4-5 unchanged. -continue Norco -continue turmeric Turmeric to reduce inflammation--can be used in cooking or taken as a supplement.  Benefits of turmeric:  -Highly anti-inflammatory  -Increases  antioxidants  -Improves memory, attention, brain disease  -Lowers risk of heart disease  -May help prevent cancer  -Decreases pain  -Alleviates depression  -Delays aging and decreases risk of chronic disease  -Consume with black pepper to increase absorption    Turmeric Milk Recipe:  1 cup milk  1 tsp turmeric  1 tsp cinnamon  1 tsp grated ginger (optional)  Black pepper (boosts the anti-inflammatory properties of turmeric).  1 tsp honey

## 2022-10-28 NOTE — Addendum Note (Signed)
Addended by: Wayna Chalet on: 10/28/2022 02:37 PM   Modules accepted: Orders

## 2022-10-28 NOTE — Progress Notes (Unsigned)
PROVIDER NOTE: Interpretation of information contained herein should be left to medically-trained personnel. Specific patient instructions are provided elsewhere under "Patient Instructions" section of medical record. This document was created in part using STT-dictation technology, any transcriptional errors that may result from this process are unintentional.  Patient: Dana Bradley Type: Established DOB: 03/12/1971 MRN: [id number] PCP: Delsa Grana, PA-C  Service: Procedure DOS: 10/29/2022 Setting: Ambulatory Location: Ambulatory outpatient facility Delivery: Face-to-face Provider: Gaspar Cola, MD Specialty: Interventional Pain Management Specialty designation: 09 Location: Outpatient facility Ref. Prov.: Delsa Grana, PA-C    Procedure:           Type: Lumbar trans-foraminal epidural steroid injection (L-TFESI) #1  Laterality: Right (-RT)  Level: L3 nerve root(s) Imaging: Fluoroscopy-guided         Anesthesia: Local anesthesia (1-2% Lidocaine) Anxiolysis: IV Versed 3.0 mg + Glycopyrrolate 0.2 mg Sedation: Moderate Sedation Fentanyl 1 mL (50 mcg) DOS: 10/29/2022  Performed by: Gaspar Cola, MD  Purpose: Diagnostic/Therapeutic Indications: Lumbar radicular pain severe enough to impact quality of life or function. 1. Herniated nucleus pulposus, L3-4 (Right)   2. Lumbar radiculitis (Right)   3. 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 Chronic low back pain (1ry area of Pain) (Bilateral) (R>L) (midline) w/ sciatica (Bilateral)   5. Chronic lower extremity pain (Bilateral)   6. Chronic lower extremity cramps (Bilateral) (R>L)   7. DDD (degenerative disc disease), lumbar   8. Abnormal MRI, lumbar spine (12/27/2021)    History of allergy to radiographic contrast media   History of vasovagal episode   Latex precautions, history of latex allergy      NAS-11 Pain score:   Pre-procedure: 6 /10   Post-procedure: 0-No pain/10     Position / Prep / Materials:  Position:  Prone  Prep solution: DuraPrep (Iodine Povacrylex [0.7% available iodine] and Isopropyl Alcohol, 74% w/w) Prep Area: Entire Posterior Lumbosacral Area.  From the lower tip of the scapula down to the tailbone and from flank to flank. Materials:  Tray: Block Needle(s):  Type: Spinal  Gauge (G): 22  Length: 5-in  Qty: 1   Pre-op H&P Assessment:  Dana Bradley is a 52 y.o. (year old), female patient, seen today for interventional treatment. She  has a past surgical history that includes Ablation; Tubal ligation (10/01/1999); Cardiac catheterization; Knee arthroscopy; Colonoscopy with propofol (N/A, 05/17/2015); Esophagogastroduodenoscopy (N/A, 05/17/2015); spg; Tibial Tubercle Bypass (Right, 1998); Colonoscopy with propofol (N/A, 03/20/2020); Breast biopsy (Left); Colonoscopy with propofol (N/A, 09/25/2022); Esophagogastroduodenoscopy (N/A, 09/25/2022); Colonoscopy (N/A, 09/26/2022); Esophagogastroduodenoscopy (N/A, 09/26/2022); and Colonoscopy with propofol (N/A, 10/02/2022). Dana Bradley has a current medication list which includes the following prescription(s): amitriptyline, atorvastatin, baclofen, butalbital-acetaminophen-caffeine, cyanocobalamin, diclofenac sodium, dicyclomine, doxycycline, vyepti, hydrocodone-acetaminophen, [START ON 11/17/2022] hydrocodone-acetaminophen, lipase/protease/amylase, lorazepam, meclizine, medroxyprogesterone, metformin, metoprolol tartrate, naloxone, needles & syringes, ondansetron, polyethylene glycol-electrolytes, rimegepant sulfate, rizatriptan, scopolamine, sertraline, valerian, vitamin d (ergocalciferol), diclofenac sodium, dicyclomine, hydrocodone-acetaminophen, and pregabalin, and the following Facility-Administered Medications: fentanyl and lactated ringers. Her primarily concern today is the Back Pain (low)  Initial Vital Signs:  Pulse/HCG Rate: 92ECG Heart Rate: (!) 110 (ST) Temp: (!) 97.4 F (36.3 C) Resp: 16 BP: 117/75 SpO2: 100 %  BMI: Estimated body mass  index is 40.74 kg/m as calculated from the following:   Height as of this encounter: '5\' 3"'$  (1.6 m).   Weight as of this encounter: 230 lb (104.3 kg).  Risk Assessment: Allergies: Reviewed. She is allergic to aspirin, cephalexin, cymbalta [duloxetine hcl], depakote [divalproex sodium], gadolinium derivatives, haloperidol, meperidine, metoclopramide,  morphine, penicillins, prochlorperazine, tramadol hcl, trazodone, meloxicam, neomycin-bacitracin zn-polymyx, tomato, other, shellfish allergy, shellfish-derived products, bacitra-neomycin-polymyxin-hc, bacitracin-neomycin-polymyxin, cephalosporins, ibuprofen, latex, nsaids, sulfa antibiotics, and sulfonamide derivatives.  Allergy Precautions: None required Coagulopathies: Reviewed. None identified.  Blood-thinner therapy: None at this time Active Infection(s): Reviewed. None identified. Dana Bradley is afebrile  Site Confirmation: Dana Bradley was asked to confirm the procedure and laterality before marking the site Procedure checklist: Completed Consent: Before the procedure and under the influence of no sedative(s), amnesic(s), or anxiolytics, the patient was informed of the treatment options, risks and possible complications. To fulfill our ethical and legal obligations, as recommended by the American Medical Association's Code of Ethics, I have informed the patient of my clinical impression; the nature and purpose of the treatment or procedure; the risks, benefits, and possible complications of the intervention; the alternatives, including doing nothing; the risk(s) and benefit(s) of the alternative treatment(s) or procedure(s); and the risk(s) and benefit(s) of doing nothing. The patient was provided information about the general risks and possible complications associated with the procedure. These may include, but are not limited to: failure to achieve desired goals, infection, bleeding, organ or nerve damage, allergic reactions, paralysis, and death. In  addition, the patient was informed of those risks and complications associated to Spine-related procedures, such as failure to decrease pain; infection (i.e.: Meningitis, epidural or intraspinal abscess); bleeding (i.e.: epidural hematoma, subarachnoid hemorrhage, or any other type of intraspinal or peri-dural bleeding); organ or nerve damage (i.e.: Any type of peripheral nerve, nerve root, or spinal cord injury) with subsequent damage to sensory, motor, and/or autonomic systems, resulting in permanent pain, numbness, and/or weakness of one or several areas of the body; allergic reactions; (i.e.: anaphylactic reaction); and/or death. Furthermore, the patient was informed of those risks and complications associated with the medications. These include, but are not limited to: allergic reactions (i.e.: anaphylactic or anaphylactoid reaction(s)); adrenal axis suppression; blood sugar elevation that in diabetics may result in ketoacidosis or comma; water retention that in patients with history of congestive heart failure may result in shortness of breath, pulmonary edema, and decompensation with resultant heart failure; weight gain; swelling or edema; medication-induced neural toxicity; particulate matter embolism and blood vessel occlusion with resultant organ, and/or nervous system infarction; and/or aseptic necrosis of one or more joints. Finally, the patient was informed that Medicine is not an exact science; therefore, there is also the possibility of unforeseen or unpredictable risks and/or possible complications that may result in a catastrophic outcome. The patient indicated having understood very clearly. We have given the patient no guarantees and we have made no promises. Enough time was given to the patient to ask questions, all of which were answered to the patient's satisfaction. Dana Bradley has indicated that she wanted to continue with the procedure. Attestation: I, the ordering provider, attest that I  have discussed with the patient the benefits, risks, side-effects, alternatives, likelihood of achieving goals, and potential problems during recovery for the procedure that I have provided informed consent. Date  Time: 10/29/2022  8:57 AM   Pre-Procedure Preparation:  Monitoring: As per clinic protocol. Respiration, ETCO2, SpO2, BP, heart rate and rhythm monitor placed and checked for adequate function Safety Precautions: Patient was assessed for positional comfort and pressure points before starting the procedure. Time-out: I initiated and conducted the "Time-out" before starting the procedure, as per protocol. The patient was asked to participate by confirming the accuracy of the "Time Out" information. Verification of the correct person, site, and procedure were performed  and confirmed by me, the nursing staff, and the patient. "Time-out" conducted as per Joint Commission's Universal Protocol (UP.01.01.01). Time: 0920  Description/Narrative of Procedure:          Target: The 6 o'clock position under the pedicle, on the affected side. Region: Posterolateral Lumbosacral Approach: Posterior Percutaneous Paravertebral approach.  Rationale (medical necessity): procedure needed and proper for the diagnosis and/or treatment of the patient's medical symptoms and needs. Procedural Technique Safety Precautions: Aspiration looking for blood return was conducted prior to all injections. At no point did we inject any substances, as a needle was being advanced. No attempts were made at seeking any paresthesias. Safe injection practices and needle disposal techniques used. Medications properly checked for expiration dates. SDV (single dose vial) medications used. Description of the Procedure: Protocol guidelines were followed. The patient was placed in position over the procedure table. The target area was identified and the area prepped in the usual manner. Skin & deeper tissues infiltrated with local  anesthetic. Appropriate amount of time allowed to pass for local anesthetics to take effect. The procedure needles were then advanced to the target area. Proper needle placement secured. Negative aspiration confirmed. Solution injected in intermittent fashion, asking for systemic symptoms every 0.5cc of injectate. The needles were then removed and the area cleansed, making sure to leave some of the prepping solution back to take advantage of its long term bactericidal properties.  Vitals:   10/29/22 0925 10/29/22 0929 10/29/22 0939 10/29/22 0948  BP: 128/76 128/76 109/66 115/69  Pulse:      Resp: '18 16 15 13  '$ Temp:      SpO2: 100% 100% 99% 98%  Weight:      Height:        Start Time: 0920 hrs. End Time: 0928 hrs.  Imaging Guidance (Spinal):          Type of Imaging Technique: Fluoroscopy Guidance (Spinal) Indication(s): Assistance in needle guidance and placement for procedures requiring needle placement in or near specific anatomical locations not easily accessible without such assistance. Exposure Time: Please see nurses notes. Contrast: Before injecting any contrast, we confirmed that the patient did not have an allergy to iodine, shellfish, or radiological contrast. Once satisfactory needle placement was completed at the desired level, radiological contrast was injected. Contrast injected under live fluoroscopy. No contrast complications. See chart for type and volume of contrast used. Fluoroscopic Guidance: I was personally present during the use of fluoroscopy. "Tunnel Vision Technique" used to obtain the best possible view of the target area. Parallax error corrected before commencing the procedure. "Direction-depth-direction" technique used to introduce the needle under continuous pulsed fluoroscopy. Once target was reached, antero-posterior, oblique, and lateral fluoroscopic projection used confirm needle placement in all planes. Images permanently stored in EMR. Interpretation: I  personally interpreted the imaging intraoperatively. Adequate needle placement confirmed in multiple planes. Appropriate spread of contrast into desired area was observed. No evidence of afferent or efferent intravascular uptake. No intrathecal or subarachnoid spread observed. Permanent images saved into the patient's record.  Post-operative Assessment:  Post-procedure Vital Signs:  Pulse/HCG Rate: 9281 Temp: (!) 97.4 F (36.3 C) Resp: 13 BP: 115/69 SpO2: 98 %  EBL: None  Complications: No immediate post-treatment complications observed by team, or reported by patient.  Note: The patient tolerated the entire procedure well. A repeat set of vitals were taken after the procedure and the patient was kept under observation following institutional policy, for this type of procedure. Post-procedural neurological assessment was performed, showing return  to baseline, prior to discharge. The patient was provided with post-procedure discharge instructions, including a section on how to identify potential problems. Should any problems arise concerning this procedure, the patient was given instructions to immediately contact us, at any time, without hesitation. In any case, we plan to contact the patient by telephone for a follow-up status report regarding this interventional procedure.  Comments:  No additional relevant information.  Plan of Care  Orders:  Orders Placed This Encounter  Procedures   Lumbar Transforaminal Epidural    Scheduling Instructions:     Laterality: Right-sided     Level(s): L3             Sedation: With Sedation.     Timeframe: Today    Order Specific Question:   Where will this procedure be performed?    Answer:   [facility]   [facility] C-ARM 1-60 MIN NO REPORT    Intraoperative interpretation by procedural physician at [place].    Standing Status:   Standing    Number of Occurrences:   1    Order Specific Question:   Reason for exam:     Answer:   Assistance in needle guidance and placement for procedures requiring needle placement in or near specific anatomical locations not easily accessible without such assistance.   Informed Consent Details: Physician/Practitioner Attestation; Transcribe to consent form and obtain patient signature    Provider Attestation: I, [place] Dossie Arbour, MD, (Pain Management Specialist), the physician/practitioner, attest that I have discussed with the patient the benefits, risks, side effects, alternatives, likelihood of achieving goals and potential problems during recovery for the procedure that I have provided informed consent.    Scheduling Instructions:     Note: Always confirm laterality of pain with Dana Bradley, before procedure.     Transcribe to consent form and obtain patient signature.    Order Specific Question:   Physician/Practitioner attestation of informed consent for procedure/surgical case    Answer:   I, the physician/practitioner, attest that I have discussed with the patient the benefits, risks, side effects, alternatives, likelihood of achieving goals and potential problems during recovery for the procedure that I have provided informed consent.    Order Specific Question:   Procedure    Answer:   Diagnostic lumbar transforaminal epidural steroid injection under fluoroscopic guidance. (See notes for level and laterality.)    Order Specific Question:   Physician/Practitioner performing the procedure    Answer:   Ekaterina Denise A. Dossie Arbour, MD    Order Specific Question:   Indication/Reason    Answer:   Lumbar radiculopathy/radiculitis associated with lumbar stenosis   Provide equipment / supplies at bedside    Procedure tray: "Block Tray" (Disposable  single use) Skin infiltration needle: Regular 1.5-in, 25-G, (x1) Block Needle type: Spinal Amount/quantity: 1 Size: Medium (5-inch) Gauge: 22G    Standing Status:   Standing    Number of Occurrences:   1    Order Specific Question:    Specify    Answer:   Block Tray   Miscellanous precautions    Standing Status:   Standing    Number of Occurrences:   1   Latex precautions    Activate Latex-Free Protocol.    Standing Status:   Standing    Number of Occurrences:   1   Chronic Opioid Analgesic:  Hydrocodone/APAP 7.5/325, 1 tab PO QD. she appears to be using less than 10 pills/month. MME/day: 3.75 mg/day.   Medications  ordered for procedure: Meds ordered this encounter  Medications   lidocaine (XYLOCAINE) 2 % (with pres) injection 400 mg   pentafluoroprop-tetrafluoroeth (GEBAUERS) aerosol   lactated ringers infusion   midazolam (VERSED) 5 MG/5ML injection 0.5-2 mg    Make sure Flumazenil is available in the pyxis when using this medication. If oversedation occurs, administer 0.2 mg IV over 15 sec. If after 45 sec no response, administer 0.2 mg again over 1 min; may repeat at 1 min intervals; not to exceed 4 doses (1 mg)   fentaNYL (SUBLIMAZE) injection 25-50 mcg    Make sure Narcan is available in the pyxis when using this medication. In the event of respiratory depression (RR< 8/min): Titrate NARCAN (naloxone) in increments of 0.1 to 0.2 mg IV at 2-3 minute intervals, until desired degree of reversal.   glycopyrrolate (ROBINUL) injection 0.2 mg   sodium chloride flush (NS) 0.9 % injection 1 mL   ropivacaine (PF) 2 mg/mL (0.2%) (NAROPIN) injection 1 mL   dexamethasone (DECADRON) injection 10 mg   Medications administered: We administered lidocaine, pentafluoroprop-tetrafluoroeth, lactated ringers, midazolam, fentaNYL, glycopyrrolate, sodium chloride flush, ropivacaine (PF) 2 mg/mL (0.2%), and dexamethasone.  See the medical record for exact dosing, route, and time of administration.  Follow-up plan:   Return in about 2 weeks (around 11/12/2022) for Proc-day (T,Th), (VV), (PPE).       Interventional Therapies  Risk  Complexity Considerations:   Morbid obesity Allergy to contrast. Allergy to latex. Allergy to  NSAIDs. History of vasovagal episodes with spinal manipulation.   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  (03/29/2022 thoracic MRI)  Therapeutic left cervical ESI #4  Therapeutic right L4-5 LESI #2    Completed:   Diagnostic/therapeutic right L3-4 TFESI x1 (10/29/2022)  Neurosurgical referral for decompressive laminectomy and/or discectomy Therapeutic left C7-T1 cervical ESI x2 (10/30/2016)  Therapeutic bilateral lumbar facet block x1 (01/06/2017)  Therapeutic left SI joint block x2 (08/03/2019)  Therapeutic right L1-2 LESI x1 (12/16/2017)  Therapeutic left L1-2 LESI x1 (04/07/2017)  Therapeutic bilateral L2 TFESI x2 (11/09/2020)  Therapeutic left L4-5 LESI x1 (09/07/2019)  Therapeutic left trapezius muscle TPI/MNB x3 (03/19/2018)  Therapeutic right trapezius muscle trigger point injection x1 (11/18/2017)  Therapeutic left occipital nerve RFA x1 (12/25/2017)  Therapeutic left C2 + TON RFA x1 (12/25/2017)  Therapeutic left caudal ESI x1 (09/28/2019)  Therapeutic right T8-9 thoracic ESI x1 (11/09/2020)  It would appear that the last therapeutic left C7-T1 cervical ESI #3 (02/13/2021) did not provide her with any significant relief of the pain.  However, her very first cervical epidural steroid injection was done on 09/11/2016 and again caused a flareup of her pain.  Follow-up assessment indicated 80/60/70/> 50.  The procedure was then repeated on 10/30/2016 and although the patient did not keep up with the follow-up on that procedure, it would seem that her pain got under control and did not need any further interventions in the cervical region until 12/25/2017 when she had a left occipital nerve (Left) C2/TON RFA.  Unfortunately, the patient is rather noncompliant with  follow-up evaluations after diagnostic and therapeutic procedures and therefore the information collected on those results is not available to contribute towards obtaining better results.   Therapeutic  Palliative (PRN) options:      Pharmacotherapy  Nonopioid transferred 11/08/2020: Lyrica, baclofen      Recent Visits Date Type Provider Dept  09/18/22 Office Visit Milinda Pointer, MD [facility]  Showing recent visits within past 90 days and meeting all other requirements Today's Visits Date Type Provider Dept  10/29/22 Procedure visit Milinda Pointer, MD [facility]  Showing today's visits and meeting all other requirements Future Appointments Date Type Provider Dept  11/12/22 Appointment Milinda Pointer, [facility]  12/11/22 Appointment Milinda Pointer, MD [facility]  Showing future appointments within next 90 days and meeting all other requirements  Disposition: Discharge home  Discharge (Date  Time): 10/29/2022; 1000 hrs.   Primary Care Physician: Delsa Grana, PA-C Location: [facility] Note by: Gaspar Cola, MD Date: 10/29/2022; Time: 9:50 AM  Disclaimer:  Medicine is not an Chief Strategy Officer. The only guarantee in medicine is that nothing is guaranteed. It is important to note that the decision to proceed with this intervention was based on the information collected from the patient. The Data and conclusions were drawn from the patient's questionnaire, the interview, and the physical examination. Because the information was provided in large part by the patient, it cannot be guaranteed that it has not been purposely or unconsciously manipulated. Every effort has been made to obtain as much relevant data as possible for this evaluation. It is important to note that the conclusions that lead to this procedure are derived in large part from the available data. Always take into account that  the treatment will also be dependent on availability of resources and existing treatment guidelines, considered by other Pain Management Practitioners as being common knowledge and practice, at the time of the intervention. For Medico-Legal purposes, it is also important to point out that variation in procedural techniques and pharmacological choices are the acceptable norm. The indications, contraindications, technique, and results of the above procedure should only be interpreted and judged by a Board-Certified Interventional Pain Specialist with extensive familiarity and expertise in the same exact procedure and technique.

## 2022-10-28 NOTE — Progress Notes (Signed)
Jonathon Bellows MD, MRCP(U.K) [address]  [place]  [place], [place] [address]  Main: [phone]  Fax: [phone]   Primary Care Physician: Delsa Grana, PA-C  Primary Gastroenterologist:  Dr. Jonathon Bellows   Chief Complaint  Patient presents with   Hemorrhoids   Constipation   Bloated    HPI: Dana Bradley is a 52 y.o. female   Summary of history : Initially referred and seen on 11/10/2019 for abdominal pain and diarrhea. She was previously a patient of [facility] gastroenterology.   Known to have internal hemorrhoids per her last colonoscopy in 2016,  acid reflux with esophagitis in 2016 per her endoscopy.    She has a longstanding history of irritable bowel syndrome with diarrhea.    She consumes some sweet and low in her drinks daily. up to 2 or 3 bowel movements which are very runny sticky and smelly each day.  Sometimes appear to be pale-colored.  On  Dexilant for her acid reflux. She has generalized abdominal pain on and off crampy in nature associated with watery diarrhea and relieved after bowel movement.  Stress  make it worse.   10/18/2019 CT scan of the abdomen and pelvis with contrast demonstrates mild hepatic steatosis but no acute intra-abdominal pathology.     12/07/2019: H. pylori breath test, celiac serology negative.  Stool for C. difficile as well as fecal calprotectin negative. 03/20/2020 colonoscopy: No polyps seen no colitis noted endoscopically biopsies of the terminal ileum as well as random colon biopsies were completely normal.  09/06/2022: seen by Delsa Grana, PA-C for abdominal pain Also had BRBPR.   09/06/2022: Hb 12.7 grams , CMP-normal.    09/06/2022:  Ct abdomen and pelvis : F ocal abnormal appearing segment of transverse colon. It is unclear if this represents an area of irregular wall thickening or potentially the appearance is secondary to dense enteric contents. Additionally, there is a focal area of narrowing involving the distal transverse colon.  Recommend further evaluation with colonoscopy to exclude the possibility of colonic malignancy   2. Dense enteric contents within the distal colon, potentially ingested contents. Possibility of blood products not excluded    Interval history 09/12/2022-10/28/2022      09/26/2022: EGD: Normal.  Colonoscopy was performed on the same day large amount of stool seen in the rectum sigmoid colon interfering with visualization.  A repeat colonoscopy was attempted on 10/02/2022 large amount of liquid stool was seen in the entire colon interfering with visualization.   She comes in today to see me because she has not had a bowel movement for a few days has developed abdominal distention very nauseous.  Denies any rectal bleeding.  Has not tried any prescription laxatives.  She has had issues with bowel prep as it makes her nauseous.      Current Outpatient Medications  Medication Sig Dispense Refill   amitriptyline (ELAVIL) 100 MG tablet Take 1 tablet (100 mg total) by mouth at bedtime. 90 tablet 3   atorvastatin (LIPITOR) 40 MG tablet TAKE 1 TABLET (40 MG TOTAL) BY MOUTH AT BEDTIME. 90 tablet 3   baclofen (LIORESAL) 10 MG tablet Take 0.5-1 tablets (5-10 mg total) by mouth 3 (three) times daily. Each refill must last 30 days. 270 tablet 1   butalbital-acetaminophen-caffeine (BAC) 50-325-40 MG tablet Take 1 tablet by mouth every 4 (four) hours as needed for migraine. 60 tablet 3   cyanocobalamin (,VITAMIN B-12,) 1000 MCG/ML injection INJECT 1 ML (1,000 MCG) INTO THE MUSCLE ONCE  FOR 1 DOSE 1 mL 3   diclofenac Sodium (VOLTAREN) 1 % GEL Apply topically.     dicyclomine (BENTYL) 10 MG capsule Take by mouth.     doxycycline (VIBRA-TABS) 100 MG tablet Take by mouth.     Eptinezumab-jjmr (VYEPTI) 100 MG/ML injection Inject 1 mL (100 mg total) into the vein every 3 (three) months. 1 mL 3   HYDROcodone-acetaminophen (NORCO) 7.5-325 MG tablet Take 1 tablet by mouth 2 (two) times daily as needed for severe pain.  Must last 30 days 25 tablet 0   [START ON 11/17/2022] HYDROcodone-acetaminophen (NORCO) 7.5-325 MG tablet Take 1 tablet by mouth 2 (two) times daily as needed for severe pain. Must last 30 days 25 tablet 0   lipase/protease/amylase (CREON) 12000-38000 units CPEP capsule Take 2 capsules by mouth with the first bite of each meal and take 1 capsule before snacks. (Max of 8 capsules per day). 270 capsule 1   LORazepam (ATIVAN) 1 MG tablet Take by mouth.     meclizine (ANTIVERT) 12.5 MG tablet Take 1 tablet (12.5 mg total) by mouth 3 (three) times daily as needed for dizziness. 30 tablet 0   medroxyPROGESTERone (PROVERA) 5 MG tablet TAKE 1 TABLET BY MOUTH DAILY 90 tablet 1   metFORMIN (GLUCOPHAGE) 1000 MG tablet Take 1 tablet (1,000 mg total) by mouth 2 (two) times daily with a meal. 180 tablet 1   metoprolol tartrate (LOPRESSOR) 50 MG tablet TAKE 1 AND 1/2 TABLET BY MOUTH 2 TIMES DAILY 135 tablet 3   naloxone (NARCAN) nasal spray 4 mg/0.1 mL Place 1 spray into the nose as needed for up to 365 doses (for opioid-induced respiratory depresssion). In case of emergency (overdose), spray once into each nostril. If no response within 3 minutes, repeat application and call 546. 1 each 0   Needles & Syringes MISC For administration of B12 injections 30 each 0   ondansetron (ZOFRAN) 4 MG tablet Take 1 tablet (4 mg total) by mouth every 8 (eight) hours as needed. 20 tablet 2   Rimegepant Sulfate 75 MG TBDP Take 1 tablet by mouth every other day.     rizatriptan (MAXALT) 10 MG tablet Take 1 tablet (10 mg total) by mouth as needed for migraine. May repeat in 2 hours if needed 10 tablet 3   scopolamine (TRANSDERM-SCOP) 1 MG/3DAYS Place 1 patch (1.5 mg total) onto the skin every 3 (three) days. 10 patch 12   sertraline (ZOLOFT) 25 MG tablet Take 1 tablet (25 mg total) by mouth at bedtime. 30 tablet 3   VALERIAN PO Take by mouth as needed. Makes Valerian tea about 3-4 times per week.     Vitamin D, Ergocalciferol,  (DRISDOL) 1.25 MG (50000 UNIT) CAPS capsule Take 1 capsule (50,000 Units total) by mouth every 7 (seven) days. 7 capsule 0   diclofenac Sodium (VOLTAREN) 1 % GEL Apply 2 g topically 4 (four) times daily as needed. 350 g PRN   dicyclomine (BENTYL) 10 MG capsule Take 1 capsule (10 mg total) by mouth 4 (four) times daily as needed (before meals and at bedtime) 360 capsule 1   HYDROcodone-acetaminophen (NORCO) 7.5-325 MG tablet Take 1 tablet by mouth 2 (two) times daily as needed for severe pain. Must last 30 days 25 tablet 0   pregabalin (LYRICA) 150 MG capsule Take 1 capsule (150 mg total) by mouth every 8 (eight) hours. 270 capsule 3   No current facility-administered medications for this visit.    Allergies as of 10/28/2022 -  Review Complete 10/28/2022  Allergen Reaction Noted   Aspirin Swelling 06/13/2008   Cephalexin Rash 12/10/2012   Cymbalta [duloxetine hcl] Other (See Comments) 09/09/2016   Depakote [divalproex sodium] Shortness Of Breath 12/10/2012   Gadolinium derivatives  07/20/2019   Haloperidol Shortness Of Breath 05/15/2015   Meperidine Nausea And Vomiting 12/10/2012   Metoclopramide Shortness Of Breath 09/16/2016   Morphine  02/20/2022   Penicillins Rash 06/13/2008   Prochlorperazine Other (See Comments) 09/16/2016   Tramadol hcl Palpitations 09/09/2016   Trazodone Shortness Of Breath 01/23/2015   Meloxicam Other (See Comments) 03/17/2014   Neomycin-bacitracin zn-polymyx Rash 12/10/2012   Tomato Hives 04/29/2016   Other  03/17/2014   Shellfish allergy  03/17/2014   Shellfish-derived products Other (See Comments) 03/17/2014   Bacitra-neomycin-polymyxin-hc Rash 09/12/2022   Bacitracin-neomycin-polymyxin Rash 05/15/2015   Cephalosporins Rash 05/15/2015   Ibuprofen Other (See Comments) and Rash 01/05/2013   Latex Itching 03/17/2014   Nsaids Other (See Comments) 03/17/2014   Sulfa antibiotics Rash 03/17/2014   Sulfonamide derivatives Rash 06/13/2008    ROS:  General:  Negative for anorexia, weight loss, fever, chills, fatigue, weakness. ENT: Negative for hoarseness, difficulty swallowing , nasal congestion. CV: Negative for chest pain, angina, palpitations, dyspnea on exertion, peripheral edema.  Respiratory: Negative for dyspnea at rest, dyspnea on exertion, cough, sputum, wheezing.  GI: See history of present illness. GU:  Negative for dysuria, hematuria, urinary incontinence, urinary frequency, nocturnal urination.  Endo: Negative for unusual weight change.    Physical Examination:   BP 132/82 (BP Location: Left Arm, Patient Position: Sitting, Cuff Size: Large)   Pulse (!) 102   Temp 98.7 F (37.1 C) (Oral)   Ht '5\' 3"'$  (1.6 m)   Wt 229 lb 8 oz (104.1 kg)   LMP 10/22/2021 (Approximate)   BMI 40.65 kg/m   General: Well-nourished, well-developed in no acute distress.  Eyes: No icterus. Conjunctivae pink. Mouth: Oropharyngeal mucosa moist and pink , no lesions erythema or exudate. Lungs: Clear to auscultation bilaterally. Non-labored. Heart: Regular rate and rhythm, no murmurs rubs or gallops.  Abdomen: Bowel sounds are normal, nontender, mild distention.  No hepatosplenomegaly or masses, no abdominal bruits or hernia , no rebound or guarding.   Extremities: No lower extremity edema. No clubbing or deformities. Neuro: Alert and oriented x 3.  Grossly intact. Skin: Warm and dry, no jaundice.   Psych: Alert and cooperative, normal mood and affect.   Imaging Studies: XR KNEE 3 VIEW LEFT  Result Date: 10/10/2022 X-rays demonstrate patellofemoral arthritis.  Patella alta.  Well-preserved femoral-tibial joint spaces.   Assessment and Plan:   Dana Bradley is a 52 y.o. y/o female with a history of EPI GERD and IBS.  Was seen in December 2023 for a CT scan of the abdomen which shows accumulation of blood like material within the colon as well as narrowing in the transverse colon.  Last colonoscopy was in 2021 which appeared to be normal.  She has been on  narcotics and appears that she suffers from severe constipation and we have attempted to clean her out onto occasions to have a look inside her colon but have failed.  EGD was normal.   Plan 1.  Bowel cleanout with GoLytely which she has available at home.  Can premedicate with a dose of Zofran which she also has a level at home.  Repeat colonoscopy on a Monday morning with a 2-day prep with clear instructions given that the color of stool should be completely transparent on the  day of the procedure 2.  Will plan for hemorrhoidal banding once colonoscopy is completed. 3.  Commence on Linzess 290 mcg/day samples will be provided if it responds she will call back in a week's time for prescription 4..  No upper abdominal pain at this point of time if it were to recur could consider biliary work up    I have discussed alternative options, risks & benefits,  which include, but are not limited to, bleeding, infection, perforation,respiratory complication & drug reaction.  The patient agrees with this plan & written consent will be obtained.      Dr Jonathon Bellows  MD,MRCP [facility]) Follow up in 8 weeks

## 2022-10-29 ENCOUNTER — Ambulatory Visit: Payer: Medicaid Other | Attending: Pain Medicine | Admitting: Pain Medicine

## 2022-10-29 ENCOUNTER — Encounter: Payer: Self-pay | Admitting: Pain Medicine

## 2022-10-29 ENCOUNTER — Ambulatory Visit
Admission: RE | Admit: 2022-10-29 | Discharge: 2022-10-29 | Disposition: A | Discharge status: Home / Self Care | Payer: Medicaid Other | Source: Ambulatory Visit | Attending: Pain Medicine | Admitting: Pain Medicine

## 2022-10-29 VITALS — BP 109/65 | HR 92 | Temp 97.4°F | Resp 12 | Ht 63.0 in | Wt 230.0 lb

## 2022-10-29 DIAGNOSIS — R55 Syncope and collapse: Secondary | ICD-10-CM | POA: Diagnosis present

## 2022-10-29 DIAGNOSIS — G8929 Other chronic pain: Secondary | ICD-10-CM

## 2022-10-29 DIAGNOSIS — M5441 Lumbago with sciatica, right side: Secondary | ICD-10-CM | POA: Diagnosis present

## 2022-10-29 DIAGNOSIS — Z91040 Latex allergy status: Secondary | ICD-10-CM | POA: Diagnosis present

## 2022-10-29 DIAGNOSIS — Z91041 Radiographic dye allergy status: Secondary | ICD-10-CM | POA: Diagnosis present

## 2022-10-29 DIAGNOSIS — M5442 Lumbago with sciatica, left side: Secondary | ICD-10-CM | POA: Insufficient documentation

## 2022-10-29 DIAGNOSIS — M5416 Radiculopathy, lumbar region: Secondary | ICD-10-CM | POA: Diagnosis present

## 2022-10-29 DIAGNOSIS — M5136 Other intervertebral disc degeneration, lumbar region: Secondary | ICD-10-CM | POA: Diagnosis present

## 2022-10-29 DIAGNOSIS — E66813 Obesity, class 3: Secondary | ICD-10-CM

## 2022-10-29 DIAGNOSIS — M5126 Other intervertebral disc displacement, lumbar region: Secondary | ICD-10-CM

## 2022-10-29 DIAGNOSIS — R937 Abnormal findings on diagnostic imaging of other parts of musculoskeletal system: Secondary | ICD-10-CM

## 2022-10-29 DIAGNOSIS — M79605 Pain in left leg: Secondary | ICD-10-CM | POA: Diagnosis present

## 2022-10-29 DIAGNOSIS — R252 Cramp and spasm: Secondary | ICD-10-CM

## 2022-10-29 DIAGNOSIS — M79604 Pain in right leg: Secondary | ICD-10-CM | POA: Diagnosis present

## 2022-10-29 DIAGNOSIS — M51369 Other intervertebral disc degeneration, lumbar region without mention of lumbar back pain or lower extremity pain: Secondary | ICD-10-CM

## 2022-10-29 MED ORDER — DEXAMETHASONE SODIUM PHOSPHATE 10 MG/ML IJ SOLN
10.0000 mg | Freq: Once | INTRAMUSCULAR | Status: AC
  Administered 2022-10-29: 10 mg

## 2022-10-29 MED ORDER — LACTATED RINGERS IV SOLN: Freq: Once | INTRAVENOUS | Status: AC

## 2022-10-29 MED ORDER — GLYCOPYRROLATE 0.2 MG/ML IJ SOLN
0.2000 mg | Freq: Once | INTRAMUSCULAR | Status: AC
  Administered 2022-10-29: 0.2 mg via INTRAVENOUS

## 2022-10-29 MED ORDER — PENTAFLUOROPROP-TETRAFLUOROETH EX AERO
INHALATION_SPRAY | Freq: Once | CUTANEOUS | Status: AC
  Administered 2022-10-29: 30 via TOPICAL
  Filled 2022-10-29: qty 116

## 2022-10-29 MED ORDER — MIDAZOLAM HCL 5 MG/5ML IJ SOLN
0.5000 mg | Freq: Once | INTRAMUSCULAR | Status: AC
  Administered 2022-10-29: 3 mg via INTRAVENOUS

## 2022-10-29 MED ORDER — DEXAMETHASONE SODIUM PHOSPHATE 10 MG/ML IJ SOLN
INTRAMUSCULAR | Status: AC
  Filled 2022-10-29: qty 1

## 2022-10-29 MED ORDER — LIDOCAINE HCL (PF) 2 % IJ SOLN
INTRAMUSCULAR | Status: AC
  Filled 2022-10-29: qty 5

## 2022-10-29 MED ORDER — SODIUM CHLORIDE (PF) 0.9 % IJ SOLN
INTRAMUSCULAR | Status: AC
  Filled 2022-10-29: qty 10

## 2022-10-29 MED ORDER — ROPIVACAINE HCL 2 MG/ML IJ SOLN
1.0000 mL | Freq: Once | INTRAMUSCULAR | Status: AC
  Administered 2022-10-29: 1 mL via EPIDURAL

## 2022-10-29 MED ORDER — LIDOCAINE HCL 2 % IJ SOLN
20.0000 mL | Freq: Once | INTRAMUSCULAR | Status: AC
  Administered 2022-10-29: 100 mg

## 2022-10-29 MED ORDER — MIDAZOLAM HCL 5 MG/5ML IJ SOLN
INTRAMUSCULAR | Status: AC
  Filled 2022-10-29: qty 5

## 2022-10-29 MED ORDER — FENTANYL CITRATE (PF) 100 MCG/2ML IJ SOLN
25.0000 ug | INTRAMUSCULAR | Status: DC | PRN
  Administered 2022-10-29: 50 ug via INTRAVENOUS

## 2022-10-29 MED ORDER — SODIUM CHLORIDE 0.9% FLUSH
1.0000 mL | Freq: Once | INTRAVENOUS | Status: AC
  Administered 2022-10-29: 1 mL

## 2022-10-29 MED ORDER — ROPIVACAINE HCL 2 MG/ML IJ SOLN
INTRAMUSCULAR | Status: AC
  Filled 2022-10-29: qty 20

## 2022-10-29 MED ORDER — FENTANYL CITRATE (PF) 100 MCG/2ML IJ SOLN
INTRAMUSCULAR | Status: AC
  Filled 2022-10-29: qty 2

## 2022-10-29 NOTE — Progress Notes (Signed)
Safety precautions to be maintained throughout the outpatient stay will include: orient to surroundings, keep bed in low position, maintain call bell within reach at all times, provide assistance with transfer out of bed and ambulation.

## 2022-10-29 NOTE — Patient Instructions (Signed)

## 2022-10-30 ENCOUNTER — Telehealth: Payer: Self-pay

## 2022-10-30 ENCOUNTER — Other Ambulatory Visit: Payer: Self-pay | Admitting: Physical Medicine and Rehabilitation

## 2022-10-30 DIAGNOSIS — M797 Fibromyalgia: Secondary | ICD-10-CM

## 2022-10-30 NOTE — Telephone Encounter (Signed)
Post procedure follow up.  Patient states she is doing well.   ?

## 2022-10-31 ENCOUNTER — Encounter: Payer: Medicaid Other | Admitting: Psychology

## 2022-11-06 ENCOUNTER — Encounter: Payer: Self-pay | Admitting: Gastroenterology

## 2022-11-07 ENCOUNTER — Ambulatory Visit: Payer: Medicaid Other | Admitting: Psychology

## 2022-11-07 ENCOUNTER — Other Ambulatory Visit: Payer: Self-pay

## 2022-11-07 DIAGNOSIS — M6208 Separation of muscle (nontraumatic), other site: Secondary | ICD-10-CM

## 2022-11-07 DIAGNOSIS — G4733 Obstructive sleep apnea (adult) (pediatric): Secondary | ICD-10-CM | POA: Diagnosis not present

## 2022-11-07 NOTE — Telephone Encounter (Signed)
Dana Bradley I do not know of a Programmer, systems - she can see Dr Dahlia Byes who is a Education officer, environmental . I will also C/c Delsa Grana, PA-C to see if she can help [place] out with this

## 2022-11-08 NOTE — Progress Notes (Addendum)
Patient: Dana Bradley  Service Category: E/M  Provider: Gaspar Cola, MD  DOB: Jun 17, 1971  DOS: 11/12/2022  Location: Office  MRN: [id number]  Setting: Ambulatory outpatient  Referring Provider: Delsa Grana, PA-C  Type: Established Patient  Specialty: Interventional Pain Management  PCP: Delsa Grana, PA-C  Location: Remote location  Delivery: TeleHealth     Virtual Encounter - Pain Management PROVIDER NOTE: Information contained herein reflects review and annotations entered in association with encounter. Interpretation of such information and data should be left to medically-trained personnel. Information provided to patient can be located elsewhere in the medical record under "Patient Instructions". Document created using STT-dictation technology, any transcriptional errors that may result from process are unintentional.    Contact & Pharmacy Preferred: [phone] Home: [phone] (home) Mobile: [phone] (mobile) E-mail: [email or web address]  [place], [place] [address] Phone: [phone] Fax: [place] [address] Phone: [phone] Fax: [place] [place] [address] Phone: [phone] Fax: [phone]   Pre-screening  [name] offered "in-person" vs "virtual" encounter. She indicated preferring virtual for this encounter.   Reason COVID-19*  Social distancing based on CDC and AMA recommendations.   I contacted [name] on 11/12/2022 via telephone.      I clearly identified myself as Gaspar Cola, MD. I verified that I was speaking with the correct person using two identifiers (Name: Dana Bradley, and date of birth: 09/16/1971).  Consent I sought verbal advanced consent from [name] for virtual visit interactions. I informed  Ms. Lassetter of possible security and privacy concerns, risks, and limitations associated with providing "not-in-person" medical evaluation and management services. I also informed Ms. Wittich of the availability of "in-person" appointments. Finally, I informed her that there would be a charge for the virtual visit and that she could be  personally, fully or partially, financially responsible for it. Ms. Delhoyo expressed understanding and agreed to proceed.   Historic Elements   [name] is a 52 y.o. year old, female patient evaluated today after our last contact on 10/29/2022. [name]  has a past medical history of Acute postoperative pain (04/07/2017), Anxiety, Bursitis, Chronic fatigue (12/12/2017), Chronic fatigue syndrome, Colitis (2021), Diabetes mellitus without complication ([place]), Edema leg (05/02/2015), Fibromyalgia, GERD (gastroesophageal reflux disease), IBS (irritable bowel syndrome), Knee pain, bilateral (12/21/2008), Lumbar discitis, Migraines, Osteoarthritis, Right hand pain (04/10/2015), Sleep apnea, Spinal stenosis, SVT (supraventricular tachycardia), Vertigo, and Vitamin D deficiency (05/01/2016). She also  has a past surgical history that includes Ablation; Tubal ligation (10/01/1999); Cardiac catheterization; Knee arthroscopy; Colonoscopy with propofol (N/A, 05/17/2015); Esophagogastroduodenoscopy (N/A, 05/17/2015); spg; Tibial Tubercle Bypass (Right, 1998); Colonoscopy with propofol (N/A, 03/20/2020); Breast biopsy (Left); Colonoscopy with propofol (N/A, 09/25/2022); Esophagogastroduodenoscopy (N/A, 09/25/2022); Colonoscopy (N/A, 09/26/2022); Esophagogastroduodenoscopy (N/A, 09/26/2022); and Colonoscopy with propofol (N/A, 10/02/2022). [name] has a current medication list which includes the following prescription(s): amitriptyline, atorvastatin, baclofen, butalbital-acetaminophen-caffeine, cyanocobalamin, diclofenac sodium, dicyclomine, doxycycline, vyepti, hydrocodone-acetaminophen, [START  ON 11/17/2022] hydrocodone-acetaminophen, lipase/protease/amylase, lorazepam, meclizine, medroxyprogesterone, metformin, metoprolol tartrate, naloxone, needles & syringes, ondansetron, polyethylene glycol-electrolytes, pregabalin, rimegepant sulfate, rizatriptan, scopolamine, sertraline, valerian, vitamin d (ergocalciferol), diclofenac sodium, dicyclomine, and hydrocodone-acetaminophen. She  reports that she quit smoking about 29 years ago. Her smoking use included cigarettes. She has a 6.00 pack-year smoking history. She has never used smokeless tobacco. She reports that she does not drink alcohol and  does not use drugs. [name] is allergic to aspirin, cephalexin, cymbalta [duloxetine hcl], depakote [divalproex sodium], gadolinium derivatives, haloperidol, meperidine, metoclopramide, morphine, penicillins, prochlorperazine, tramadol hcl, trazodone, meloxicam, neomycin-bacitracin zn-polymyx, tomato, eggs or egg-derived products, milk-related compounds, other, shellfish allergy, shellfish-derived products, bacitra-neomycin-polymyxin-hc, bacitracin-neomycin-polymyxin, cephalosporins, ibuprofen, latex, nsaids, sulfa antibiotics, and sulfonamide derivatives.  Estimated body mass index is 40.74 kg/m as calculated from the following:   Height as of 10/29/22: '5\' 3"'$  (1.6 m).   Weight as of 10/29/22: 230 lb (104.3 kg).  HPI  Today, she is being contacted for a post-procedure assessment.  Once again, there is a disparity between the immediate postprocedure pain score and what the patient has reported to the nursing staff on follow-up.  On the day of the procedure the patient initially indicated having a pain score of 6/10 and immediately after the procedure she reported her pain to be a 0/10.  On follow-up, she tells the nursing staff that she had 0% relief for the first hour after the procedure followed by only 50% relief for the following 4 to 6 hours.  Above talking to the patient she indicates that according to  her notes her pain was a 6/10 when she left the clinic and was slowly started getting better after that.  She refers that by the next morning she was not having any pain at all in the right leg.  However, some of it it started to return and currently she refers having an ongoing 80% improvement of her right lower extremity pain.  She refers that she does still have some pain in the lower back and occasional discomfort through the back of the leg, but the pain no longer gets all the way into her foot.  Today she indicates that she would love to have that left side done, as soon as possible.  She refers having pain in the left leg going all the way into the lateral aspect and the bottom of her foot and what appears to be an S1 dermatomal distribution.  According to her latest Lumbar MRI done on 12/27/2021 she has a right-sided disc herniation at the L1-2 level, a right foraminal and extraforaminal disc herniation at the L3-4 level that may be affecting the right L3 nerve and she has some dislocation and mild bulging of the L4-5 level.  Surprisingly, there does not seem to be any major problems at or around the right L5-S1 level.  In view of this, we will plan on bringing the patient in for a right-sided L4-5 LESI to see if we can help her with some of this remaining pain.  Post-procedure evaluation   Type: Lumbar trans-foraminal epidural steroid injection (L-TFESI) #1  Laterality: Right (-RT)  Level: L3 nerve root(s) Imaging: Fluoroscopy-guided         Anesthesia: Local anesthesia (1-2% Lidocaine) Anxiolysis: IV Versed 3.0 mg + Glycopyrrolate 0.2 mg Sedation: Moderate Sedation Fentanyl 1 mL (50 mcg) DOS: 10/29/2022  Performed by: Gaspar Cola, MD  Purpose: Diagnostic/Therapeutic Indications: Lumbar radicular pain severe enough to impact quality of life or function. 1. Herniated nucleus pulposus, L3-4 (Right)   2. Lumbar radiculitis (Right)   3. 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 Chronic  low back pain (1ry area of Pain) (Bilateral) (R>L) (midline) w/ sciatica (Bilateral)   5. Chronic lower extremity pain (Bilateral)   6. Chronic lower extremity cramps (Bilateral) (R>L)   7. DDD (degenerative disc disease), lumbar   8. Abnormal MRI, lumbar spine (12/27/2021)  History of allergy to radiographic contrast media   History of vasovagal episode   Latex precautions, history of latex allergy      NAS-11 Pain score:   Pre-procedure: 6 /10   Post-procedure: 0-No pain/10      Effectiveness:  Initial hour after procedure: 0 %. Subsequent 4-6 hours post-procedure: 50 %. Analgesia past initial 6 hours: 80 % (ongoing). Ongoing improvement:  Analgesic: The patient indicates having an ongoing 80% improvement of her right lower extremity pain.  Currently it is the left lower extremity that is bothering her. Function: Ms. Twardzik reports improvement in function ROM: Ms. Bonus reports improvement in ROM  Pharmacotherapy Assessment   Opioid Analgesic: Hydrocodone/APAP 7.5/325, 1 tab PO QD. she appears to be using less than 10 pills/month. MME/day: 3.75 mg/day.   Monitoring: [place] PMP: PDMP reviewed during this encounter.       Pharmacotherapy: No side-effects or adverse reactions reported. Compliance: No problems identified. Effectiveness: Clinically acceptable. Plan: Refer to "POC". UDS:  Summary  Date Value Ref Range Status  01/28/2022 Note  Final    Comment:    ==================================================================== [employer] 13 (MW) ==================================================================== Test                             Result       Flag       Units  Drug Present and Declared for Prescription Verification   Hydromorphone                  38           EXPECTED   ng/mg creat    Hydromorphone may be administered as a scheduled prescription    medication; it is also an expected metabolite of hydrocodone.  Drug Absent but Declared for  Prescription Verification   Lorazepam                      Not Detected UNEXPECTED ng/mg creat   Hydrocodone                    Not Detected UNEXPECTED ng/mg creat    Hydrocodone is almost always present in patients taking this drug    consistently. Absence of hydrocodone could be due to lapse of time    since the last dose or unusual pharmacokinetics (rapid metabolism).    Butalbital                     Not Detected UNEXPECTED ==================================================================== Test                      Result    Flag   Units      Ref Range   Creatinine              135              mg/dL      >=20 ==================================================================== Declared Medications:  The flagging and interpretation on this report are based on the  following declared medications.  Unexpected results may arise from  inaccuracies in the declared medications.   **Note: The testing scope of this panel includes these medications:   Butalbital  Hydrocodone (Norco)  Lorazepam (Ativan)   **Note: The testing scope of this panel does not include the  following reported medications:   Acetaminophen  Acetaminophen (Norco)  Amitriptyline (Elavil)  Atorvastatin (Lipitor)  Baclofen (Lioresal)  Caffeine  Carbamazepine (Tegretol)  Diclofenac (Voltaren)  Dicyclomine (Bentyl)  Dihydroergotamine (Migranal)  Meclizine (Antivert)  Medroxyprogesterone (Provera)  Metformin  Metoprolol  Omeprazole  Ondansetron (Zofran)  Pancrelipase (Creon)  Pregabalin (Lyrica)  Rimegepant (Nurtec)  Vitamin B12 ==================================================================== For clinical consultation, please call [phone]. ====================================================================    No results found for: "CBDTHCR", "D8THCCBX", "D9THCCBX"   Laboratory Chemistry Profile   Renal Lab Results  Component Value Date   BUN 14 09/06/2022   CREATININE 0.81 09/06/2022    BCR SEE NOTE: 09/06/2022   GFRAA >60 02/22/2020   GFRNONAA >60 07/06/2021    Hepatic Lab Results  Component Value Date   AST 22 09/06/2022   ALT 35 (H) 09/06/2022   ALBUMIN 4.2 07/06/2021   ALKPHOS 109 07/06/2021   HCVAB NON REACTIVE 09/21/2019   LIPASE 37 09/06/2022    Electrolytes Lab Results  Component Value Date   NA 143 09/06/2022   K 4.8 09/06/2022   CL 105 09/06/2022   CALCIUM 9.9 09/06/2022   MG 2.1 04/01/2017    Bone Lab Results  Component Value Date   VD25OH 16.49 (L) 02/22/2020   25OHVITD1 20 (L) 04/01/2017   25OHVITD2 5.4 04/01/2017   25OHVITD3 15 04/01/2017    Inflammation (CRP: Acute Phase) (ESR: Chronic Phase) Lab Results  Component Value Date   CRP 6.0 07/30/2018   ESRSEDRATE 17 07/30/2018         Note: Above Lab results reviewed.  Imaging  DG PAIN CLINIC C-ARM 1-60 MIN NO REPORT Fluoro was used, but no Radiologist interpretation will be provided.  Please refer to "NOTES" tab for provider progress note.  Assessment  The primary encounter diagnosis was Lumbar radiculitis (Left). Diagnoses of Chronic low back pain (1ry area of Pain) (Bilateral) (R>L) (midline) w/ sciatica (Bilateral), Chronic lower extremity pain (Bilateral), Lumbar radiculitis (Right), Chronic lower extremity cramps (Bilateral) (R>L), Abnormal MRI, lumbar spine (12/27/2021), and DDD (degenerative disc disease), lumbosacral were also pertinent to this visit.  Plan of Care  Problem-specific:  No problem-specific Assessment & Plan notes found for this encounter.  Ms. Kamilly Heiserman has a current medication list which includes the following long-term medication(s): amitriptyline, atorvastatin, baclofen, diclofenac sodium, dicyclomine, hydrocodone-acetaminophen, [START ON 11/17/2022] hydrocodone-acetaminophen, medroxyprogesterone, metformin, metoprolol tartrate, needles & syringes, pregabalin, rizatriptan, sertraline, diclofenac sodium, dicyclomine, and  hydrocodone-acetaminophen.  Pharmacotherapy (Medications Ordered): No orders of the defined types were placed in this encounter.  Orders:  Orders Placed This Encounter  Procedures   Lumbar Epidural Injection    Standing Status:   Future    Standing Expiration Date:   02/10/2023    Scheduling Instructions:     Procedure: Interlaminar Lumbar Epidural Steroid injection (LESI)  L4-5     Laterality: Right-sided     Sedation: Patient's choice.     Timeframe: ASAA    Order Specific Question:   Where will this procedure be performed?    Answer:   [facility]   Follow-up plan:   Return for (Clinic): (R) L4-5 LESI #1. (Addendum: Treatment not covered by plan.)    Interventional Therapies  Risk  Complexity Considerations:   Morbid obesity Allergy to contrast. Allergy to latex. Allergy to NSAIDs. History of vasovagal episodes with spinal manipulation.   Planned  Pending:   Diagnostic/therapeutic right L4-5 LESI #1    Under consideration:   Diagnostic/therapeutic right L3-4 TFESI #2  Diagnostic/therapeutic midline L1-2 LESI #3  Not a candidate for a spinal cord stimulator secondary to T7-8 disc protrusion and central spinal stenosis with moderate cord  flattening.  (03/29/2022 thoracic MRI)  Therapeutic left cervical ESI #4  Therapeutic right L4-5 LESI #1    Completed:   Diagnostic/therapeutic right L3 TFESI x1 (10/29/2022) (0/50/100/80)  Neurosurgical referral for decompressive laminectomy and/or discectomy Therapeutic left C7-T1 cervical ESI x3 (02/13/2021) (did not keep F/U.  Does not remember.) Therapeutic bilateral lumbar facet MBB x1 (01/06/2017)  Therapeutic left SI joint block x2 (08/03/2019) (80/30/80/20)  Therapeutic right L1-2 LESI x1 (12/16/2017)  Therapeutic left L1-2 LESI x1 (04/07/2017)  Therapeutic bilateral L2 TFESI x2 (11/09/2020) (did not keep F/U.  Does not remember.) Therapeutic midline T8-9 thoracic ESI x2 (11/09/2020) (did not keep F/U.  Does not  remember.) Therapeutic left L4-5 LESI x1 (09/07/2019) (50/50/50/50)  Therapeutic left trapezius muscle TPI/MNB x3 (03/19/2018) (100/80/90/>75)  Therapeutic right trapezius muscle trigger point injection x1 (11/18/2017)  Therapeutic left occipital nerve RFA x1 (12/25/2017)  Therapeutic left C2 + TON RFA x1 (12/25/2017)  Therapeutic left caudal ESI x1 (09/28/2019) (0/0/0/30)  Therapeutic right T8-9 thoracic ESI x1 (11/09/2020)  It would appear that the last therapeutic left C7-T1 cervical ESI #3 (02/13/2021) did not provide her with any significant relief of the pain.  However, her very first cervical epidural steroid injection was done on 09/11/2016 and again caused a flareup of her pain.  Follow-up assessment indicated 80/60/70/> 50.  The procedure was then repeated on 10/30/2016 and although the patient did not keep up with the follow-up on that procedure, it would seem that her pain got under control and did not need any further interventions in the cervical region until 12/25/2017 when she had a left occipital nerve (Left) C2/TON RFA.  Unfortunately, the patient is rather noncompliant with follow-up evaluations after diagnostic and therapeutic procedures and therefore the information collected on those results is not available to contribute towards obtaining better results.   Therapeutic  Palliative (PRN) options:      Pharmacotherapy  Nonopioid transferred 11/08/2020: Lyrica, baclofen       Recent Visits Date Type Provider Dept  10/29/22 Procedure visit Milinda Pointer, MD [facility]  09/18/22 Office Visit Milinda Pointer, MD [facility]  Showing recent visits within past 90 days and meeting all other requirements Today's Visits Date Type Provider Dept  11/12/22 Office Visit Milinda Pointer, MD [facility]  Showing today's visits and meeting all other requirements Future Appointments Date Type Provider Dept  12/11/22 Appointment Milinda Pointer, MD  [facility]  Showing future appointments within next 90 days and meeting all other requirements  I discussed the assessment and treatment plan with the patient. The patient was provided an opportunity to ask questions and all were answered. The patient agreed with the plan and demonstrated an understanding of the instructions.  Patient advised to call back or seek an in-person evaluation if the symptoms or condition worsens.  Duration of encounter: 18 minutes.  Note by: Gaspar Cola, MD Date: 11/12/2022; Time: 12:56 PM

## 2022-11-11 ENCOUNTER — Encounter: Payer: Self-pay | Admitting: Gastroenterology

## 2022-11-11 ENCOUNTER — Telehealth: Payer: Self-pay

## 2022-11-11 NOTE — Telephone Encounter (Signed)
Called and she states she has a extra prep at home so does not need this called in to the pharmacy. Moved patient to 11/13/2022. Called Endo and talk to [place] and she will get patient moved

## 2022-11-11 NOTE — Telephone Encounter (Signed)
If not cleaned out reschedule for Wednesday with another gallong till stool is color of apple juice

## 2022-11-12 ENCOUNTER — Ambulatory Visit: Payer: Medicaid Other | Attending: Pain Medicine | Admitting: Pain Medicine

## 2022-11-12 DIAGNOSIS — M79604 Pain in right leg: Secondary | ICD-10-CM | POA: Diagnosis not present

## 2022-11-12 DIAGNOSIS — R252 Cramp and spasm: Secondary | ICD-10-CM | POA: Diagnosis not present

## 2022-11-12 DIAGNOSIS — M5441 Lumbago with sciatica, right side: Secondary | ICD-10-CM

## 2022-11-12 DIAGNOSIS — R937 Abnormal findings on diagnostic imaging of other parts of musculoskeletal system: Secondary | ICD-10-CM

## 2022-11-12 DIAGNOSIS — M79605 Pain in left leg: Secondary | ICD-10-CM | POA: Diagnosis not present

## 2022-11-12 DIAGNOSIS — M5442 Lumbago with sciatica, left side: Secondary | ICD-10-CM | POA: Diagnosis not present

## 2022-11-12 DIAGNOSIS — M5416 Radiculopathy, lumbar region: Secondary | ICD-10-CM

## 2022-11-12 DIAGNOSIS — M5137 Other intervertebral disc degeneration, lumbosacral region: Secondary | ICD-10-CM | POA: Diagnosis not present

## 2022-11-12 DIAGNOSIS — G8929 Other chronic pain: Secondary | ICD-10-CM

## 2022-11-12 NOTE — Patient Instructions (Signed)

## 2022-11-13 ENCOUNTER — Encounter: Payer: Self-pay | Admitting: Gastroenterology

## 2022-11-13 ENCOUNTER — Ambulatory Visit: Payer: Medicaid Other | Admitting: Certified Registered"

## 2022-11-13 ENCOUNTER — Encounter
Admission: RE | Disposition: A | Discharge status: Home / Self Care | Payer: Self-pay | Source: Home / Self Care | Attending: Gastroenterology

## 2022-11-13 ENCOUNTER — Ambulatory Visit
Admission: RE | Admit: 2022-11-13 | Discharge: 2022-11-13 | Disposition: A | Discharge status: Home / Self Care | Payer: Medicaid Other | Attending: Gastroenterology | Admitting: Gastroenterology

## 2022-11-13 DIAGNOSIS — I10 Essential (primary) hypertension: Secondary | ICD-10-CM | POA: Insufficient documentation

## 2022-11-13 DIAGNOSIS — D123 Benign neoplasm of transverse colon: Secondary | ICD-10-CM | POA: Diagnosis not present

## 2022-11-13 DIAGNOSIS — Z6839 Body mass index (BMI) 39.0-39.9, adult: Secondary | ICD-10-CM | POA: Diagnosis not present

## 2022-11-13 DIAGNOSIS — Z833 Family history of diabetes mellitus: Secondary | ICD-10-CM | POA: Diagnosis not present

## 2022-11-13 DIAGNOSIS — Z87891 Personal history of nicotine dependence: Secondary | ICD-10-CM | POA: Insufficient documentation

## 2022-11-13 DIAGNOSIS — F418 Other specified anxiety disorders: Secondary | ICD-10-CM | POA: Diagnosis not present

## 2022-11-13 DIAGNOSIS — K635 Polyp of colon: Secondary | ICD-10-CM | POA: Diagnosis not present

## 2022-11-13 DIAGNOSIS — Z8249 Family history of ischemic heart disease and other diseases of the circulatory system: Secondary | ICD-10-CM | POA: Diagnosis not present

## 2022-11-13 DIAGNOSIS — D126 Benign neoplasm of colon, unspecified: Secondary | ICD-10-CM | POA: Diagnosis not present

## 2022-11-13 DIAGNOSIS — K640 First degree hemorrhoids: Secondary | ICD-10-CM | POA: Insufficient documentation

## 2022-11-13 DIAGNOSIS — M1990 Unspecified osteoarthritis, unspecified site: Secondary | ICD-10-CM | POA: Insufficient documentation

## 2022-11-13 DIAGNOSIS — K219 Gastro-esophageal reflux disease without esophagitis: Secondary | ICD-10-CM | POA: Insufficient documentation

## 2022-11-13 DIAGNOSIS — G4730 Sleep apnea, unspecified: Secondary | ICD-10-CM | POA: Diagnosis not present

## 2022-11-13 DIAGNOSIS — I471 Supraventricular tachycardia, unspecified: Secondary | ICD-10-CM | POA: Insufficient documentation

## 2022-11-13 DIAGNOSIS — M797 Fibromyalgia: Secondary | ICD-10-CM | POA: Insufficient documentation

## 2022-11-13 DIAGNOSIS — R933 Abnormal findings on diagnostic imaging of other parts of digestive tract: Secondary | ICD-10-CM | POA: Diagnosis present

## 2022-11-13 DIAGNOSIS — R935 Abnormal findings on diagnostic imaging of other abdominal regions, including retroperitoneum: Secondary | ICD-10-CM | POA: Diagnosis not present

## 2022-11-13 DIAGNOSIS — K625 Hemorrhage of anus and rectum: Secondary | ICD-10-CM

## 2022-11-13 DIAGNOSIS — E119 Type 2 diabetes mellitus without complications: Secondary | ICD-10-CM | POA: Insufficient documentation

## 2022-11-13 DIAGNOSIS — Z7984 Long term (current) use of oral hypoglycemic drugs: Secondary | ICD-10-CM | POA: Diagnosis not present

## 2022-11-13 HISTORY — PX: COLONOSCOPY WITH PROPOFOL: SHX5780

## 2022-11-13 SURGERY — COLONOSCOPY WITH PROPOFOL
Anesthesia: General

## 2022-11-13 MED ORDER — PROPOFOL 10 MG/ML IV BOLUS
INTRAVENOUS | Status: DC | PRN
  Administered 2022-11-13: 150 ug/kg/min via INTRAVENOUS
  Administered 2022-11-13: 80 mg via INTRAVENOUS

## 2022-11-13 MED ORDER — SODIUM CHLORIDE 0.9 % IV SOLN: INTRAVENOUS | Status: DC

## 2022-11-13 MED ORDER — LIDOCAINE HCL (CARDIAC) PF 100 MG/5ML IV SOSY
PREFILLED_SYRINGE | INTRAVENOUS | Status: DC | PRN
  Administered 2022-11-13: 100 mg via INTRAVENOUS

## 2022-11-13 NOTE — Anesthesia Postprocedure Evaluation (Signed)
Anesthesia Post Note  Patient: Dana Bradley  Procedure(s) Performed: COLONOSCOPY WITH PROPOFOL  Patient location during evaluation: PACU Anesthesia Type: General Level of consciousness: awake and awake and alert Pain management: pain level controlled Vital Signs Assessment: post-procedure vital signs reviewed and stable Respiratory status: nonlabored ventilation and respiratory function stable Cardiovascular status: stable Anesthetic complications: no   There were no known notable events for this encounter.   Last Vitals:  Vitals:   11/13/22 1131 11/13/22 1141  BP: 107/64 118/76  Pulse: 83 86  Resp: 15 19  Temp: 36.5 C   SpO2: 99% 100%    Last Pain:  Vitals:   11/13/22 1141  TempSrc:   PainSc: 0-No pain                 VAN STAVEREN,Dracen Reigle

## 2022-11-13 NOTE — Anesthesia Preprocedure Evaluation (Signed)
Anesthesia Evaluation  Patient identified by MRN, date of birth, ID band Patient awake    Reviewed: Allergy & Precautions, NPO status , Patient's Chart, lab work & pertinent test results  Airway Mallampati: III  TM Distance: >3 FB Neck ROM: full    Dental  (+) Teeth Intact   Pulmonary neg pulmonary ROS, sleep apnea and Continuous Positive Airway Pressure Ventilation , former smoker   Pulmonary exam normal breath sounds clear to auscultation       Cardiovascular hypertension, negative cardio ROS Normal cardiovascular exam+ dysrhythmias Supra Ventricular Tachycardia  Rhythm:Regular Rate:Normal     Neuro/Psych  Headaches  Anxiety Depression    negative neurological ROS  negative psych ROS   GI/Hepatic negative GI ROS, Neg liver ROS,GERD  Medicated,,  Endo/Other  negative endocrine ROSdiabetes, Well Controlled, Type 2, Oral Hypoglycemic Agents  Morbid obesity  Renal/GU negative Renal ROS  negative genitourinary   Musculoskeletal  (+) Arthritis ,  Fibromyalgia -  Abdominal  (+) + obese  Peds negative pediatric ROS (+)  Hematology negative hematology ROS (+)   Anesthesia Other Findings Past Medical History: 04/07/2017: Acute postoperative pain No date: Anxiety No date: Bursitis 12/12/2017: Chronic fatigue No date: Chronic fatigue syndrome 2021: Colitis No date: Diabetes mellitus without complication ([place]) 0000000: Edema leg No date: Fibromyalgia No date: GERD (gastroesophageal reflux disease) No date: IBS (irritable bowel syndrome) 12/21/2008: Knee pain, bilateral     Comment:  Qualifier: Diagnosis of  By: Hassell Done FNP, Tori Milks   No date: Lumbar discitis No date: Migraines No date: Osteoarthritis 04/10/2015: Right hand pain     Comment:  [facility] has done nerve conduction studies and              ruled out carpal tunnel.  No date: Sleep apnea No date: Spinal stenosis No date: SVT (supraventricular  tachycardia) No date: Vertigo 05/01/2016: Vitamin D deficiency  Past Surgical History: No date: ABLATION     Comment:  Uterine No date: BREAST BIOPSY; Left     Comment:  2014 U/S bx fibroadenoma No date: CARDIAC CATHETERIZATION     Comment:  with ablation 09/26/2022: COLONOSCOPY; N/A     Comment:  Procedure: COLONOSCOPY;  Surgeon: Jonathon Bellows, MD;                Location[facility]COPY;  Service: Gastroenterology;                Laterality: N/A; 05/17/2015: COLONOSCOPY WITH PROPOFOL; N/A     Comment:  Procedure: COLONOSCOPY WITH PROPOFOL;  Surgeon: Manya Silvas, MD;  Location: [facility] ENDOSCOPY;  Service:               Endoscopy;  Laterality: N/A; 03/20/2020: COLONOSCOPY WITH PROPOFOL; N/A     Comment:  Procedure: COLONOSCOPY WITH PROPOFOL;  Surgeon: Jonathon Bellows, MD;  Location: [facility] ENDOSCOPY;  Service:               Gastroenterology;  Laterality: N/A; 09/25/2022: COLONOSCOPY WITH PROPOFOL; N/A     Comment:  Procedure: COLONOSCOPY WITH PROPOFOL;  Surgeon: Jonathon Bellows, MD;  Location: [facility] ENDOSCOPY;  Service:               Gastroenterology;  Laterality: N/A; 10/02/2022: COLONOSCOPY WITH PROPOFOL; N/A  Comment:  Procedure: COLONOSCOPY WITH PROPOFOL;  Surgeon: Jonathon Bellows, MD;  Location: [facility] ENDOSCOPY;  Service:               Gastroenterology;  Laterality: N/A; 05/17/2015: ESOPHAGOGASTRODUODENOSCOPY; N/A     Comment:  Procedure: ESOPHAGOGASTRODUODENOSCOPY (EGD);  Surgeon:               Manya Silvas, MD;  Location: [facility] ENDOSCOPY;                Service: Endoscopy;  Laterality: N/A; 09/25/2022: ESOPHAGOGASTRODUODENOSCOPY; N/A     Comment:  Procedure: ESOPHAGOGASTRODUODENOSCOPY (EGD);  Surgeon:               Jonathon Bellows, MD;  Location: [facility] ENDOSCOPY;  Service:               Gastroenterology;  Laterality: N/A; 09/26/2022: ESOPHAGOGASTRODUODENOSCOPY; N/A     Comment:  Procedure: ESOPHAGOGASTRODUODENOSCOPY (EGD);   Surgeon:               Jonathon Bellows, MD;  Location: [facility] ENDOSCOPY;  Service:               Gastroenterology;  Laterality: N/A; No date: KNEE ARTHROSCOPY No date: spg     Comment:  6/18 1998: Tibial Tubercle Bypass; Right 10/01/1999: TUBAL LIGATION  BMI    Body Mass Index: 39.22 kg/m      Reproductive/Obstetrics negative OB ROS                             Anesthesia Physical Anesthesia Plan  ASA: 3  Anesthesia Plan: General   Post-op Pain Management:    Induction: Intravenous  PONV Risk Score and Plan: Propofol infusion and TIVA  Airway Management Planned: Natural Airway and Nasal Cannula  Additional Equipment:   Intra-op Plan:   Post-operative Plan:   Informed Consent: I have reviewed the patients History and Physical, chart, labs and discussed the procedure including the risks, benefits and alternatives for the proposed anesthesia with the patient or authorized representative who has indicated his/her understanding and acceptance.     Dental Advisory Given  Plan Discussed with: CRNA and Surgeon  Anesthesia Plan Comments:        Anesthesia Quick Evaluation

## 2022-11-13 NOTE — Op Note (Signed)
[facility] Patient Name: Dana Bradley Procedure Date: 11/13/2022 11:08 AM MRN: [id number] Account #: [id number] Date of Birth: 02-01-1971 Admit Type: Outpatient Age: 52 Room: [facility] ENDO ROOM 3 Gender: Female Note Status: Finalized Instrument Name: Park Meo [id number] Procedure:             Colonoscopy Indications:           Abnormal CT of the GI tract Providers:             Jonathon Bellows MD, MD Referring MD:          Delsa Grana (Referring MD) Medicines:             Monitored Anesthesia Care Complications:         No immediate complications. Procedure:             Pre-Anesthesia Assessment:                        - Prior to the procedure, a History and Physical was                         performed, and patient medications, allergies and                         sensitivities were reviewed. The patient's tolerance                         of previous anesthesia was reviewed.                        - The risks and benefits of the procedure and the                         sedation options and risks were discussed with the                         patient. All questions were answered and informed                         consent was obtained.                        - ASA Grade Assessment: II - A patient with mild                         systemic disease.                        After obtaining informed consent, the colonoscope was                         passed under direct vision. Throughout the procedure,                         the patient's blood pressure, pulse, and oxygen                         saturations were monitored continuously. The                         Colonoscope was introduced  through the anus and                         advanced to the the cecum, identified by the                         appendiceal orifice. The colonoscopy was performed                         with ease. The patient tolerated the procedure well.                          The quality of the bowel preparation was excellent.                         The ileocecal valve, appendiceal orifice, and rectum                         were photographed. Findings:      The perianal and digital rectal examinations were normal.      A 3 mm polyp was found in the transverse colon. The polyp was sessile.       The polyp was removed with a jumbo cold forceps. Resection and retrieval       were complete.      Non-bleeding internal hemorrhoids were found during retroflexion. The       hemorrhoids were large and Grade I (internal hemorrhoids that do not       prolapse).      The exam was otherwise without abnormality on direct and retroflexion       views. Impression:            - One 3 mm polyp in the transverse colon, removed with                         a jumbo cold forceps. Resected and retrieved.                        - Non-bleeding internal hemorrhoids.                        - The examination was otherwise normal on direct and                         retroflexion views. Recommendation:        - Discharge patient to home (with escort).                        - Resume previous diet.                        - Continue present medications.                        - Await pathology results.                        - Repeat colonoscopy for surveillance based on                         pathology results.                        -  Return to GI office for banding of hemorroids. Procedure Code(s):     --- Professional ---                        [phone], Colonoscopy, flexible; with biopsy, single or                         multiple Diagnosis Code(s):     --- Professional ---                        D12.3, Benign neoplasm of transverse colon (hepatic                         flexure or splenic flexure)                        K64.0, First degree hemorrhoids                        R93.3, Abnormal findings on diagnostic imaging of                         other parts of digestive  tract CPT copyright 2022 American Medical Association. All rights reserved. The codes documented in this report are preliminary and upon coder review may  be revised to meet current compliance requirements. Jonathon Bellows, MD Jonathon Bellows MD, MD 11/13/2022 11:30:35 AM This report has been signed electronically. Number of Addenda: 0 Note Initiated On: 11/13/2022 11:08 AM Scope Withdrawal Time: 0 hours 10 minutes 12 seconds  Total Procedure Duration: 0 hours 15 minutes 22 seconds  Estimated Blood Loss:  Estimated blood loss: none.      [facility]

## 2022-11-13 NOTE — H&P (Signed)
Jonathon Bellows, MD [address] [place], [place], [place], [address] [address], [place], [place], [place], [address] Phone: [phone]  Fax: [phone]  Primary Care Physician:  Delsa Grana, PA-C   Pre-Procedure History & Physical: HPI:  Dana Bradley is a 52 y.o. female is here for an colonoscopy.   Past Medical History:  Diagnosis Date   Acute postoperative pain 04/07/2017   Anxiety    Bursitis    Chronic fatigue 12/12/2017   Chronic fatigue syndrome    Colitis 2021   Diabetes mellitus without complication ([place])    Edema leg 05/02/2015   Fibromyalgia    GERD (gastroesophageal reflux disease)    IBS (irritable bowel syndrome)    Knee pain, bilateral 12/21/2008   Qualifier: Diagnosis of  By: Hassell Done FNP, [place]     Lumbar discitis    Migraines    Osteoarthritis    Right hand pain 04/10/2015   [facility] has done nerve conduction studies and ruled out carpal tunnel.    Sleep apnea    Spinal stenosis    SVT (supraventricular tachycardia)    Vertigo    Vitamin D deficiency 05/01/2016    Past Surgical History:  Procedure Laterality Date   ABLATION     Uterine   BREAST BIOPSY Left    2014 U/S bx fibroadenoma   CARDIAC CATHETERIZATION     with ablation   COLONOSCOPY N/A 09/26/2022   Procedure: COLONOSCOPY;  Surgeon: Jonathon Bellows, MD;  Location: [facility] ENDOSCOPY;  Service: Gastroenterology;  Laterality: N/A;   COLONOSCOPY WITH PROPOFOL N/A 05/17/2015   Procedure: COLONOSCOPY WITH PROPOFOL;  Surgeon: Manya Silvas, MD;  Location: [facility] ENDOSCOPY;  Service: Endoscopy;  Laterality: N/A;   COLONOSCOPY WITH PROPOFOL N/A 03/20/2020   Procedure: COLONOSCOPY WITH PROPOFOL;  Surgeon: Jonathon Bellows, MD;  Location: [facility] ENDOSCOPY;  Service: Gastroenterology;  Laterality: N/A;   COLONOSCOPY WITH PROPOFOL N/A 09/25/2022   Procedure: COLONOSCOPY WITH PROPOFOL;  Surgeon: Jonathon Bellows, MD;  Location: [facility] ENDOSCOPY;  Service: Gastroenterology;  Laterality: N/A;    COLONOSCOPY WITH PROPOFOL N/A 10/02/2022   Procedure: COLONOSCOPY WITH PROPOFOL;  Surgeon: Jonathon Bellows, MD;  Location: [facility] ENDOSCOPY;  Service: Gastroenterology;  Laterality: N/A;   ESOPHAGOGASTRODUODENOSCOPY N/A 05/17/2015   Procedure: ESOPHAGOGASTRODUODENOSCOPY (EGD);  Surgeon: Manya Silvas, MD;  Location: [facility] ENDOSCOPY;  Service: Endoscopy;  Laterality: N/A;   ESOPHAGOGASTRODUODENOSCOPY N/A 09/25/2022   Procedure: ESOPHAGOGASTRODUODENOSCOPY (EGD);  Surgeon: Jonathon Bellows, MD;  Location: [facility] ENDOSCOPY;  Service: Gastroenterology;  Laterality: N/A;   ESOPHAGOGASTRODUODENOSCOPY N/A 09/26/2022   Procedure: ESOPHAGOGASTRODUODENOSCOPY (EGD);  Surgeon: Jonathon Bellows, MD;  Location: [facility] ENDOSCOPY;  Service: Gastroenterology;  Laterality: N/A;   KNEE ARTHROSCOPY     spg     6/18   Tibial Tubercle Bypass Right 1998   TUBAL LIGATION  10/01/1999    Prior to Admission medications   Medication Sig Start Date End Date Taking? Authorizing Provider  atorvastatin (LIPITOR) 40 MG tablet TAKE 1 TABLET (40 MG TOTAL) BY MOUTH AT BEDTIME. 02/26/22 02/26/23 Yes Tapia, Leisa, PA-C  baclofen (LIORESAL) 10 MG tablet Take 0.5-1 tablets (5-10 mg total) by mouth 3 (three) times daily. Each refill must last 30 days. 10/01/22  Yes Delsa Grana, PA-C  metoprolol tartrate (LOPRESSOR) 50 MG tablet TAKE 1 AND 1/2 TABLET BY MOUTH 2 TIMES DAILY 10/01/22  Yes Delsa Grana, PA-C  pregabalin (LYRICA) 150 MG capsule TAKE 1 CAPSULE BY MOUTH EVERY 8 HOURS 10/30/22  Yes Raulkar, Clide Deutscher, MD  VALERIAN PO  Take by mouth as needed. Makes Valerian tea about 3-4 times per week.   Yes [provider]  Vitamin D, Ergocalciferol, (DRISDOL) 1.25 MG (50000 UNIT) CAPS capsule Take 1 capsule (50,000 Units total) by mouth every 7 (seven) days. 10/08/22  Yes Raulkar, Clide Deutscher, MD  amitriptyline (ELAVIL) 100 MG tablet Take 1 tablet (100 mg total) by mouth at bedtime. 10/25/22   Raulkar, Clide Deutscher, MD  butalbital-acetaminophen-caffeine (BAC)  50-325-40 MG tablet Take 1 tablet by mouth every 4 (four) hours as needed for migraine. 06/07/22   Raulkar, Clide Deutscher, MD  cyanocobalamin (,VITAMIN B-12,) 1000 MCG/ML injection INJECT 1 ML (1,000 MCG) INTO THE MUSCLE ONCE FOR 1 DOSE 03/26/22   Raulkar, Clide Deutscher, MD  diclofenac Sodium (VOLTAREN) 1 % GEL Apply 2 g topically 4 (four) times daily as needed. 03/01/20 12/13/21  Milinda Pointer, MD  diclofenac Sodium (VOLTAREN) 1 % GEL Apply topically.    [provider]  dicyclomine (BENTYL) 10 MG capsule Take 1 capsule (10 mg total) by mouth 4 (four) times daily as needed (before meals and at bedtime) 01/17/21 12/13/21  Jonathon Bellows, MD  dicyclomine (BENTYL) 10 MG capsule Take by mouth.    [provider]  doxycycline (VIBRA-TABS) 100 MG tablet Take by mouth. 09/06/22   [provider]  Eptinezumab-jjmr (VYEPTI) 100 MG/ML injection Inject 1 mL (100 mg total) into the vein every 3 (three) months. 03/01/22   Raulkar, Clide Deutscher, MD  HYDROcodone-acetaminophen (NORCO) 7.5-325 MG tablet Take 1 tablet by mouth 2 (two) times daily as needed for severe pain. Must last 30 days 09/18/22 10/18/22  Milinda Pointer, MD  HYDROcodone-acetaminophen [facility]) 7.5-325 MG tablet Take 1 tablet by mouth 2 (two) times daily as needed for severe pain. Must last 30 days 10/18/22 11/17/22  Milinda Pointer, MD  HYDROcodone-acetaminophen [facility]) 7.5-325 MG tablet Take 1 tablet by mouth 2 (two) times daily as needed for severe pain. Must last 30 days 11/17/22 12/17/22  Milinda Pointer, MD  lipase/protease/amylase (CREON) 12000-38000 units CPEP capsule Take 2 capsules by mouth with the first bite of each meal and take 1 capsule before snacks. (Max of 8 capsules per day). 09/19/21   Jonathon Bellows, MD  LORazepam (ATIVAN) 1 MG tablet Take by mouth.    [provider]  meclizine (ANTIVERT) 12.5 MG tablet Take 1 tablet (12.5 mg total) by mouth 3 (three) times daily as needed for dizziness. 09/28/21   Izora Ribas, MD  medroxyPROGESTERone (PROVERA) 5 MG tablet TAKE 1 TABLET BY MOUTH DAILY 05/17/22   Harlin Heys, MD  metFORMIN (GLUCOPHAGE) 1000 MG tablet Take 1 tablet (1,000 mg total) by mouth 2 (two) times daily with a meal. 06/12/22   Delsa Grana, PA-C  naloxone [facility]) nasal spray 4 mg/0.1 mL Place 1 spray into the nose as needed for up to 365 doses (for opioid-induced respiratory depresssion). In case of emergency (overdose), spray once into each nostril. If no response within 3 minutes, repeat application and call [phone]. 09/18/22 09/18/23  Milinda Pointer, MD  Needles & Syringes MISC For administration of B12 injections 03/07/21   Raulkar, Clide Deutscher, MD  ondansetron (ZOFRAN) 4 MG tablet Take 1 tablet (4 mg total) by mouth every 8 (eight) hours as needed. 06/27/22   Raulkar, Clide Deutscher, MD  polyethylene glycol-electrolytes (NULYTELY) 420 g solution Prepare according to package instructions. Starting at 5:00 PM: Drink one 8 oz glass of mixture every 15 minutes until you finish half of the jug. Five hours prior  to procedure, drink 8 oz glass of mixture every 15 minutes until it is all gone. Make sure you do not drink anything 4 hours prior to your procedure. 10/28/22   Jonathon Bellows, MD  Rimegepant Sulfate 75 MG TBDP Take 1 tablet by mouth every other day.    [provider]  rizatriptan (MAXALT) 10 MG tablet Take 1 tablet (10 mg total) by mouth as needed for migraine. May repeat in 2 hours if needed 06/27/22   Raulkar, Clide Deutscher, MD  scopolamine (TRANSDERM-SCOP) 1 MG/3DAYS Place 1 patch (1.5 mg total) onto the skin every 3 (three) days. 02/28/22   Raulkar, Clide Deutscher, MD  sertraline (ZOLOFT) 25 MG tablet Take 1 tablet (25 mg total) by mouth at bedtime. 10/03/22   Izora Ribas, MD    Allergies as of 10/29/2022 - Review Complete 10/29/2022  Allergen Reaction Noted   Aspirin Swelling 06/13/2008   Cephalexin Rash 12/10/2012   Cymbalta [duloxetine hcl] Other (See Comments) 09/09/2016    Depakote [divalproex sodium] Shortness Of Breath 12/10/2012   Gadolinium derivatives  07/20/2019   Haloperidol Shortness Of Breath 05/15/2015   Meperidine Nausea And Vomiting 12/10/2012   Metoclopramide Shortness Of Breath 09/16/2016   Morphine  02/20/2022   Penicillins Rash 06/13/2008   Prochlorperazine Other (See Comments) 09/16/2016   Tramadol hcl Palpitations 09/09/2016   Trazodone Shortness Of Breath 01/23/2015   Meloxicam Other (See Comments) 03/17/2014   Neomycin-bacitracin zn-polymyx Rash 12/10/2012   Tomato Hives 04/29/2016   Other  03/17/2014   Shellfish allergy  03/17/2014   Shellfish-derived products Other (See Comments) 03/17/2014   Bacitra-neomycin-polymyxin-hc Rash 09/12/2022   Bacitracin-neomycin-polymyxin Rash 05/15/2015   Cephalosporins Rash 05/15/2015   Ibuprofen Other (See Comments) and Rash 01/05/2013   Latex Itching 03/17/2014   Nsaids Other (See Comments) 03/17/2014   Sulfa antibiotics Rash 03/17/2014   Sulfonamide derivatives Rash 06/13/2008    Family History  Problem Relation Age of Onset   Depression Mother    Hypertension Mother    Cancer Mother        Skin   Hyperlipidemia Mother    Anxiety disorder Mother    Migraines Mother    Alcohol abuse Father    Depression Father    Stroke Father    Heart disease Father    Hypertension Father    Anxiety disorder Father    Depression Sister    Hyperlipidemia Sister    Diabetes Sister    Hypertension Sister    Polycystic ovary syndrome Sister    Bipolar disorder Sister    Anxiety disorder Sister    Migraines Sister    Depression Sister    Hypertension Sister    Anxiety disorder Sister    Migraines Sister    Breast cancer Maternal Grandmother 69   Cancer Maternal Grandmother 63       Breast   Thyroid disease Maternal Grandmother    Arthritis Maternal Grandmother    Hyperlipidemia Maternal Grandmother    Aneurysm Maternal Grandfather    Hypertension Maternal Grandfather    Heart disease  Maternal Grandfather    Alzheimer's disease Paternal Grandmother    Heart attack Paternal Grandfather    Hypertension Paternal Grandfather    COPD Paternal Grandfather    Heart disease Paternal Grandfather    Migraines Daughter    Other Daughter        leomyoa scaroma   Migraines Daughter    Migraines Son    Alzheimer's disease Other    Bladder Cancer Neg Hx  Kidney cancer Neg Hx     Social History   Socioeconomic History   Marital status: Married    Spouse name: brian   Number of children: 3   Years of education: Not on file   Highest education level: Associate degree: academic program  Occupational History   Occupation: disbled    Comment: not able  Tobacco Use   Smoking status: Former    Packs/day: 2.00    Years: 3.00    Total pack years: 6.00    Types: Cigarettes    Quit date: 12/10/1992    Years since quitting: 29.9   Smokeless tobacco: Never   Tobacco comments:    quit 25 years ago  Vaping Use   Vaping Use: Never used  Substance and Sexual Activity   Alcohol use: No    Comment: socially   Drug use: No   Sexual activity: Not Currently    Partners: Male    Birth control/protection: Surgical  Other Topics Concern   Not on file  Social History Narrative   Lives at home with her husband and 2 of her children   Right handed   Caffeine: 0-2 cups daily   Social Determinants of Health   Financial Resource Strain: High Risk (08/29/2017)   Overall Financial Resource Strain (CARDIA)    Difficulty of Paying Living Expenses: Very hard  Food Insecurity: Food Insecurity Present (08/29/2017)   Hunger Vital Sign    Worried About Charity fundraiser in the Last Year: Often true    Ran Out of Food in the Last Year: Often true  Transportation Needs: Unmet Transportation Needs (08/29/2017)   PRAPARE - Hydrologist (Medical): Yes    Lack of Transportation (Non-Medical): Yes  Physical Activity: Inactive (08/29/2017)   Exercise Vital Sign     Days of Exercise per Week: 0 days    Minutes of Exercise per Session: 0 min  Stress: Stress Concern Present (08/29/2017)   [place]    Feeling of Stress : Rather much  Social Connections: Somewhat Isolated (08/29/2017)   Social Connection and Isolation Panel [NHANES]    Frequency of Communication with Friends and Family: Never    Frequency of Social Gatherings with Friends and Family: Never    Attends Religious Services: More than 4 times per year    Active Member of [employer] or Organizations: No    Attends Archivist Meetings: Never    Marital Status: Married  Human resources officer Violence: Not At Risk (12/16/2017)   Humiliation, Afraid, Rape, and Kick questionnaire    Fear of Current or Ex-Partner: No    Emotionally Abused: No    Physically Abused: No    Sexually Abused: No    Review of Systems: See HPI, otherwise negative ROS  Physical Exam: LMP 10/22/2021 (Approximate)  General:   Alert,  pleasant and cooperative in NAD Head:  Normocephalic and atraumatic. Neck:  Supple; no masses or thyromegaly. Lungs:  Clear throughout to auscultation, normal respiratory effort.    Heart:  +S1, +S2, Regular rate and rhythm, No edema. Abdomen:  Soft, nontender and nondistended. Normal bowel sounds, without guarding, and without rebound.   Neurologic:  Alert and  oriented x4;  grossly normal neurologically.  Impression/Plan: Dana Bradley is here for an colonoscopy to be performed for abnormal ct scan of the abdomen  Risks, benefits, limitations, and alternatives regarding  colonoscopy have been reviewed with the patient.  Questions  have been answered.  All parties agreeable.   Jonathon Bellows, MD  11/13/2022, 10:32 AM

## 2022-11-13 NOTE — Transfer of Care (Signed)
Immediate Anesthesia Transfer of Care Note  Patient: Dana Bradley  Procedure(s) Performed: COLONOSCOPY WITH PROPOFOL  Patient Location: PACU  Anesthesia Type:General  Level of Consciousness: awake, alert , and oriented  Airway & Oxygen Therapy: Patient Spontanous Breathing  Post-op Assessment: Report given to RN  Post vital signs: Reviewed and stable  Last Vitals:  Vitals Value Taken Time  BP    Temp    Pulse    Resp    SpO2      Last Pain:  Vitals:   11/13/22 1031  TempSrc: Temporal  PainSc: 0-No pain         Complications: No notable events documented.

## 2022-11-13 NOTE — H&P (Signed)
Jonathon Bellows, MD [address] [place], [place], [place], [address] [id number], [place], [place], [place], [address] Phone: [phone]  Fax: [phone]  Primary Care Physician:  Delsa Grana, PA-C   Pre-Procedure History & Physical: HPI:  Dana Bradley is a 52 y.o. female is here for an colonoscopy.   Past Medical History:  Diagnosis Date   Acute postoperative pain 04/07/2017   Anxiety    Bursitis    Chronic fatigue 12/12/2017   Chronic fatigue syndrome    Colitis 2021   Diabetes mellitus without complication ([place])    Edema leg 05/02/2015   Fibromyalgia    GERD (gastroesophageal reflux disease)    IBS (irritable bowel syndrome)    Knee pain, bilateral 12/21/2008   Qualifier: Diagnosis of  By: Hassell Done FNP, [place]     Lumbar discitis    Migraines    Osteoarthritis    Right hand pain 04/10/2015   [facility] has done nerve conduction studies and ruled out carpal tunnel.    Sleep apnea    Spinal stenosis    SVT (supraventricular tachycardia)    Vertigo    Vitamin D deficiency 05/01/2016    Past Surgical History:  Procedure Laterality Date   ABLATION     Uterine   BREAST BIOPSY Left    2014 U/S bx fibroadenoma   CARDIAC CATHETERIZATION     with ablation   COLONOSCOPY N/A 09/26/2022   Procedure: COLONOSCOPY;  Surgeon: Jonathon Bellows, MD;  Location: [facility] ENDOSCOPY;  Service: Gastroenterology;  Laterality: N/A;   COLONOSCOPY WITH PROPOFOL N/A 05/17/2015   Procedure: COLONOSCOPY WITH PROPOFOL;  Surgeon: Manya Silvas, MD;  Location: [facility] ENDOSCOPY;  Service: Endoscopy;  Laterality: N/A;   COLONOSCOPY WITH PROPOFOL N/A 03/20/2020   Procedure: COLONOSCOPY WITH PROPOFOL;  Surgeon: Jonathon Bellows, MD;  Location: [facility] ENDOSCOPY;  Service: Gastroenterology;  Laterality: N/A;   COLONOSCOPY WITH PROPOFOL N/A 09/25/2022   Procedure: COLONOSCOPY WITH PROPOFOL;  Surgeon: Jonathon Bellows, MD;  Location: [facility] ENDOSCOPY;  Service: Gastroenterology;  Laterality: N/A;    COLONOSCOPY WITH PROPOFOL N/A 10/02/2022   Procedure: COLONOSCOPY WITH PROPOFOL;  Surgeon: Jonathon Bellows, MD;  Location: [facility] ENDOSCOPY;  Service: Gastroenterology;  Laterality: N/A;   ESOPHAGOGASTRODUODENOSCOPY N/A 05/17/2015   Procedure: ESOPHAGOGASTRODUODENOSCOPY (EGD);  Surgeon: Manya Silvas, MD;  Location: [facility] ENDOSCOPY;  Service: Endoscopy;  Laterality: N/A;   ESOPHAGOGASTRODUODENOSCOPY N/A 09/25/2022   Procedure: ESOPHAGOGASTRODUODENOSCOPY (EGD);  Surgeon: Jonathon Bellows, MD;  Location: [facility] ENDOSCOPY;  Service: Gastroenterology;  Laterality: N/A;   ESOPHAGOGASTRODUODENOSCOPY N/A 09/26/2022   Procedure: ESOPHAGOGASTRODUODENOSCOPY (EGD);  Surgeon: Jonathon Bellows, MD;  Location: [facility] ENDOSCOPY;  Service: Gastroenterology;  Laterality: N/A;   KNEE ARTHROSCOPY     spg     6/18   Tibial Tubercle Bypass Right 1998   TUBAL LIGATION  10/01/1999    Prior to Admission medications   Medication Sig Start Date End Date Taking? Authorizing Provider  atorvastatin (LIPITOR) 40 MG tablet TAKE 1 TABLET (40 MG TOTAL) BY MOUTH AT BEDTIME. 02/26/22 02/26/23 Yes Tapia, Leisa, PA-C  baclofen (LIORESAL) 10 MG tablet Take 0.5-1 tablets (5-10 mg total) by mouth 3 (three) times daily. Each refill must last 30 days. 10/01/22  Yes Delsa Grana, PA-C  metoprolol tartrate (LOPRESSOR) 50 MG tablet TAKE 1 AND 1/2 TABLET BY MOUTH 2 TIMES DAILY 10/01/22  Yes Delsa Grana, PA-C  pregabalin (LYRICA) 150 MG capsule TAKE 1 CAPSULE BY MOUTH EVERY 8 HOURS 10/30/22  Yes Raulkar, Clide Deutscher, MD  VALERIAN PO  Take by mouth as needed. Makes Valerian tea about 3-4 times per week.   Yes [provider]  Vitamin D, Ergocalciferol, (DRISDOL) 1.25 MG (50000 UNIT) CAPS capsule Take 1 capsule (50,000 Units total) by mouth every 7 (seven) days. 10/08/22  Yes Raulkar, Clide Deutscher, MD  amitriptyline (ELAVIL) 100 MG tablet Take 1 tablet (100 mg total) by mouth at bedtime. 10/25/22   Raulkar, Clide Deutscher, MD  butalbital-acetaminophen-caffeine (BAC)  50-325-40 MG tablet Take 1 tablet by mouth every 4 (four) hours as needed for migraine. 06/07/22   Raulkar, Clide Deutscher, MD  cyanocobalamin (,VITAMIN B-12,) 1000 MCG/ML injection INJECT 1 ML (1,000 MCG) INTO THE MUSCLE ONCE FOR 1 DOSE 03/26/22   Raulkar, Clide Deutscher, MD  diclofenac Sodium (VOLTAREN) 1 % GEL Apply 2 g topically 4 (four) times daily as needed. 03/01/20 12/13/21  Milinda Pointer, MD  diclofenac Sodium (VOLTAREN) 1 % GEL Apply topically.    [provider]  dicyclomine (BENTYL) 10 MG capsule Take 1 capsule (10 mg total) by mouth 4 (four) times daily as needed (before meals and at bedtime) 01/17/21 12/13/21  Jonathon Bellows, MD  dicyclomine (BENTYL) 10 MG capsule Take by mouth.    [provider]  doxycycline (VIBRA-TABS) 100 MG tablet Take by mouth. 09/06/22   [provider]  Eptinezumab-jjmr (VYEPTI) 100 MG/ML injection Inject 1 mL (100 mg total) into the vein every 3 (three) months. 03/01/22   Raulkar, Clide Deutscher, MD  HYDROcodone-acetaminophen (NORCO) 7.5-325 MG tablet Take 1 tablet by mouth 2 (two) times daily as needed for severe pain. Must last 30 days 09/18/22 10/18/22  Milinda Pointer, MD  HYDROcodone-acetaminophen [facility]) 7.5-325 MG tablet Take 1 tablet by mouth 2 (two) times daily as needed for severe pain. Must last 30 days 10/18/22 11/17/22  Milinda Pointer, MD  HYDROcodone-acetaminophen [facility]) 7.5-325 MG tablet Take 1 tablet by mouth 2 (two) times daily as needed for severe pain. Must last 30 days 11/17/22 12/17/22  Milinda Pointer, MD  lipase/protease/amylase (CREON) 12000-38000 units CPEP capsule Take 2 capsules by mouth with the first bite of each meal and take 1 capsule before snacks. (Max of 8 capsules per day). 09/19/21   Jonathon Bellows, MD  LORazepam (ATIVAN) 1 MG tablet Take by mouth.    [provider]  meclizine (ANTIVERT) 12.5 MG tablet Take 1 tablet (12.5 mg total) by mouth 3 (three) times daily as needed for dizziness. 09/28/21   Izora Ribas, MD  medroxyPROGESTERone (PROVERA) 5 MG tablet TAKE 1 TABLET BY MOUTH DAILY 05/17/22   Harlin Heys, MD  metFORMIN (GLUCOPHAGE) 1000 MG tablet Take 1 tablet (1,000 mg total) by mouth 2 (two) times daily with a meal. 06/12/22   Delsa Grana, PA-C  naloxone [facility]) nasal spray 4 mg/0.1 mL Place 1 spray into the nose as needed for up to 365 doses (for opioid-induced respiratory depresssion). In case of emergency (overdose), spray once into each nostril. If no response within 3 minutes, repeat application and call [phone]. 09/18/22 09/18/23  Milinda Pointer, MD  Needles & Syringes MISC For administration of B12 injections 03/07/21   Raulkar, Clide Deutscher, MD  ondansetron (ZOFRAN) 4 MG tablet Take 1 tablet (4 mg total) by mouth every 8 (eight) hours as needed. 06/27/22   Raulkar, Clide Deutscher, MD  polyethylene glycol-electrolytes (NULYTELY) 420 g solution Prepare according to package instructions. Starting at 5:00 PM: Drink one 8 oz glass of mixture every 15 minutes until you finish half of the jug. Five hours prior  to procedure, drink 8 oz glass of mixture every 15 minutes until it is all gone. Make sure you do not drink anything 4 hours prior to your procedure. 10/28/22   Jonathon Bellows, MD  Rimegepant Sulfate 75 MG TBDP Take 1 tablet by mouth every other day.    [provider]  rizatriptan (MAXALT) 10 MG tablet Take 1 tablet (10 mg total) by mouth as needed for migraine. May repeat in 2 hours if needed 06/27/22   Raulkar, Clide Deutscher, MD  scopolamine (TRANSDERM-SCOP) 1 MG/3DAYS Place 1 patch (1.5 mg total) onto the skin every 3 (three) days. 02/28/22   Raulkar, Clide Deutscher, MD  sertraline (ZOLOFT) 25 MG tablet Take 1 tablet (25 mg total) by mouth at bedtime. 10/03/22   Izora Ribas, MD    Allergies as of 10/29/2022 - Review Complete 10/29/2022  Allergen Reaction Noted   Aspirin Swelling 06/13/2008   Cephalexin Rash 12/10/2012   Cymbalta [duloxetine hcl] Other (See Comments) 09/09/2016    Depakote [divalproex sodium] Shortness Of Breath 12/10/2012   Gadolinium derivatives  07/20/2019   Haloperidol Shortness Of Breath 05/15/2015   Meperidine Nausea And Vomiting 12/10/2012   Metoclopramide Shortness Of Breath 09/16/2016   Morphine  02/20/2022   Penicillins Rash 06/13/2008   Prochlorperazine Other (See Comments) 09/16/2016   Tramadol hcl Palpitations 09/09/2016   Trazodone Shortness Of Breath 01/23/2015   Meloxicam Other (See Comments) 03/17/2014   Neomycin-bacitracin zn-polymyx Rash 12/10/2012   Tomato Hives 04/29/2016   Other  03/17/2014   Shellfish allergy  03/17/2014   Shellfish-derived products Other (See Comments) 03/17/2014   Bacitra-neomycin-polymyxin-hc Rash 09/12/2022   Bacitracin-neomycin-polymyxin Rash 05/15/2015   Cephalosporins Rash 05/15/2015   Ibuprofen Other (See Comments) and Rash 01/05/2013   Latex Itching 03/17/2014   Nsaids Other (See Comments) 03/17/2014   Sulfa antibiotics Rash 03/17/2014   Sulfonamide derivatives Rash 06/13/2008    Family History  Problem Relation Age of Onset   Depression Mother    Hypertension Mother    Cancer Mother        Skin   Hyperlipidemia Mother    Anxiety disorder Mother    Migraines Mother    Alcohol abuse Father    Depression Father    Stroke Father    Heart disease Father    Hypertension Father    Anxiety disorder Father    Depression Sister    Hyperlipidemia Sister    Diabetes Sister    Hypertension Sister    Polycystic ovary syndrome Sister    Bipolar disorder Sister    Anxiety disorder Sister    Migraines Sister    Depression Sister    Hypertension Sister    Anxiety disorder Sister    Migraines Sister    Breast cancer Maternal Grandmother 82   Cancer Maternal Grandmother 63       Breast   Thyroid disease Maternal Grandmother    Arthritis Maternal Grandmother    Hyperlipidemia Maternal Grandmother    Aneurysm Maternal Grandfather    Hypertension Maternal Grandfather    Heart disease  Maternal Grandfather    Alzheimer's disease Paternal Grandmother    Heart attack Paternal Grandfather    Hypertension Paternal Grandfather    COPD Paternal Grandfather    Heart disease Paternal Grandfather    Migraines Daughter    Other Daughter        leomyoa scaroma   Migraines Daughter    Migraines Son    Alzheimer's disease Other    Bladder Cancer Neg Hx  Kidney cancer Neg Hx     Social History   Socioeconomic History   Marital status: Married    Spouse name: brian   Number of children: 3   Years of education: Not on file   Highest education level: Associate degree: academic program  Occupational History   Occupation: disbled    Comment: not able  Tobacco Use   Smoking status: Former    Packs/day: 2.00    Years: 3.00    Total pack years: 6.00    Types: Cigarettes    Quit date: 12/10/1992    Years since quitting: 29.9   Smokeless tobacco: Never   Tobacco comments:    quit 25 years ago  Vaping Use   Vaping Use: Never used  Substance and Sexual Activity   Alcohol use: No    Comment: socially   Drug use: No   Sexual activity: Not Currently    Partners: Male    Birth control/protection: Surgical  Other Topics Concern   Not on file  Social History Narrative   Lives at home with her husband and 2 of her children   Right handed   Caffeine: 0-2 cups daily   Social Determinants of Health   Financial Resource Strain: High Risk (08/29/2017)   Overall Financial Resource Strain (CARDIA)    Difficulty of Paying Living Expenses: Very hard  Food Insecurity: Food Insecurity Present (08/29/2017)   Hunger Vital Sign    Worried About Charity fundraiser in the Last Year: Often true    Ran Out of Food in the Last Year: Often true  Transportation Needs: Unmet Transportation Needs (08/29/2017)   PRAPARE - Hydrologist (Medical): Yes    Lack of Transportation (Non-Medical): Yes  Physical Activity: Inactive (08/29/2017)   Exercise Vital Sign     Days of Exercise per Week: 0 days    Minutes of Exercise per Session: 0 min  Stress: Stress Concern Present (08/29/2017)   [place]    Feeling of Stress : Rather much  Social Connections: Somewhat Isolated (08/29/2017)   Social Connection and Isolation Panel [NHANES]    Frequency of Communication with Friends and Family: Never    Frequency of Social Gatherings with Friends and Family: Never    Attends Religious Services: More than 4 times per year    Active Member of [employer] or Organizations: No    Attends Archivist Meetings: Never    Marital Status: Married  Human resources officer Violence: Not At Risk (12/16/2017)   Humiliation, Afraid, Rape, and Kick questionnaire    Fear of Current or Ex-Partner: No    Emotionally Abused: No    Physically Abused: No    Sexually Abused: No    Review of Systems: See HPI, otherwise negative ROS  Physical Exam: BP 123/79   Pulse 87   Temp 97.6 F (36.4 C) (Temporal)   Resp 16   Wt 100.4 kg   LMP 10/22/2021 (Approximate)   SpO2 100%   BMI 39.22 kg/m  General:   Alert,  pleasant and cooperative in NAD Head:  Normocephalic and atraumatic. Neck:  Supple; no masses or thyromegaly. Lungs:  Clear throughout to auscultation, normal respiratory effort.    Heart:  +S1, +S2, Regular rate and rhythm, No edema. Abdomen:  Soft, nontender and nondistended. Normal bowel sounds, without guarding, and without rebound.   Neurologic:  Alert and  oriented x4;  grossly normal neurologically.  Impression/Plan:  Dana Bradley is here for an colonoscopy to be performed for abnormal ct scan .  Risks, benefits, limitations, and alternatives regarding  colonoscopy have been reviewed with the patient.  Questions have been answered.  All parties agreeable.   Jonathon Bellows, MD  11/13/2022, 11:07 AM

## 2022-11-14 ENCOUNTER — Encounter: Payer: Self-pay | Admitting: Gastroenterology

## 2022-11-15 ENCOUNTER — Ambulatory Visit: Payer: Medicaid Other | Admitting: Family Medicine

## 2022-11-15 ENCOUNTER — Encounter: Payer: Self-pay | Admitting: Family Medicine

## 2022-11-15 VITALS — BP 124/76 | HR 92 | Temp 98.2°F | Resp 16 | Ht 63.0 in | Wt 223.7 lb

## 2022-11-15 DIAGNOSIS — M542 Cervicalgia: Secondary | ICD-10-CM

## 2022-11-15 DIAGNOSIS — J029 Acute pharyngitis, unspecified: Secondary | ICD-10-CM

## 2022-11-15 DIAGNOSIS — R221 Localized swelling, mass and lump, neck: Secondary | ICD-10-CM

## 2022-11-15 DIAGNOSIS — E010 Iodine-deficiency related diffuse (endemic) goiter: Secondary | ICD-10-CM | POA: Diagnosis not present

## 2022-11-15 LAB — SURGICAL PATHOLOGY

## 2022-11-15 LAB — POCT RAPID STREP A (OFFICE): Rapid Strep A Screen: POSITIVE — AB

## 2022-11-15 MED ORDER — AZITHROMYCIN 250 MG PO TABS: ORAL_TABLET | ORAL | 0 refills | Status: AC

## 2022-11-15 NOTE — Progress Notes (Signed)
Patient ID: Dana Bradley, female    DOB: [birth year], 52 y.o.   MRN: [id number]  PCP: Delsa Grana, PA-C  Chief Complaint  Patient presents with   Edema    Neck, unsure when it happened noticed it last night hurts while touching it    Subjective:   Dana Bradley is a 52 y.o. female, presents to clinic with CC of the following:  HPI  Neck mass/bulge which she noted is painful noticed pain last night with sore throat hurts from right jaw ear to throat and neck In the past she has felt like laying down her neck made it feel like she was having trouble breathing Others at home sore throat, no one else has been evaluated or dx yet No nasal sx, no fever, chills, sweat cough    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[place]) 04/04/2022   Neurogenic bladder 01/28/2022   Urinary and fecal incontinence 01/28/2022   New onset type 2 diabetes mellitus ([place]) 01/02/2022   OSA (obstructive sleep apnea) 12/14/2021   Excessive daytime sleepiness 12/14/2021   Lateral epicondylitis, left elbow 07/19/2021   Abnormal MRI, cervical spine (02/14/2021) 02/28/2021   Cervicalgia 02/13/2021   Abnormal MRI, lumbar spine (12/27/2021) 11/09/2020   Chronic midline thoracic back pain 11/09/2020   Abnormal MRI, thoracic spine (08/02/2019) 11/09/2020   Thoracic spinal stenosis (T7-8) 11/09/2020   Prolapse of thoracic disc with radiculopathy (T7-8) 11/09/2020   Spasm of muscle of lower back 11/09/2020   Vertigo, benign paroxysmal, unspecified laterality [id number]   Uncomplicated opioid dependence ([place]) 11/08/2020   Hyperalgesia 03/07/2020   Neurogenic urinary incontinence 03/01/2020   Other intervertebral disc degeneration, lumbar region 03/01/2020    Spinal stenosis of lumbosacral region 03/01/2020   Colon cancer screening 02/06/2020   Lumbar radiculitis (Right) 09/21/2019   Chronic lower extremity pain (Bilateral) 09/06/2019   Intractable migraine with aura without status migrainosus 08/10/2019   Other specified dorsopathies, sacral and sacrococcygeal region 08/03/2019   Latex precautions, history of latex allergy 08/03/2019   History of allergy to radiographic contrast media 08/03/2019   DDD (degenerative disc disease), cervical 07/21/2019   Cervical facet syndrome (Bilateral) (L>R) 07/21/2019   DDD (degenerative disc disease), thoracic 07/21/2019   Osteoarthritis of hip (Left) 07/21/2019   Chronic groin pain (Bilateral) (L>R) 07/21/2019   Chronic hip pain (Bilateral) (L>R) 07/21/2019   Somatic dysfunction of sacroiliac joint (Bilateral) 07/21/2019   Migraine with aura and with status migrainosus, not intractable 04/06/2019   Cervico-occipital neuralgia (Left) 04/06/2019   Weakness of leg (Left) 04/05/2019   Difficulty walking 04/05/2019   Chronic migraine without aura, with intractable migraine, so stated, with status migrainosus 12/27/2018   Malar rash 09/04/2018   Trigger point of neck (Left) 03/19/2018   Occipital headache 12/25/2017   Chronic fatigue syndrome with fibromyalgia 12/12/2017   Trigger point of shoulder region (Left) 11/17/2017   Myofascial pain syndrome (Left) (trapezius muscle) 07/22/2017   Lumbar L1-2 disc protrusion (Right) 04/07/2017   Muscle spasticity 04/01/2017   Osteoarthritis of shoulder (Bilateral) 04/01/2017   Lumbar spondylosis 01/06/2017   Chronic hip pain (Left) 12/24/2016   Chronic sacroiliac joint pain (Left) 12/24/2016   Lumbar facet syndrome (Bilateral) (L>R) 12/24/2016   Lumbar radiculitis (Left)  12/24/2016   Hypertriglyceridemia 11/27/2016   History of vasovagal episode 10/30/2016   Cervicogenic headache 09/09/2016   Medication monitoring encounter 08/29/2016   Controlled substance  agreement signed 08/28/2016   Plantar fasciitis of left foot 08/28/2016   Vitamin B12 deficiency 08/28/2016   Hyperlipidemia 08/28/2016   Nephrolithiasis 08/12/2016   Chronic pain syndrome 08/07/2016   Long term prescription opiate use 08/07/2016   Opiate use 08/07/2016   Long term prescription benzodiazepine use 08/07/2016   Neurogenic pain 08/07/2016   Chronic low back pain (1ry area of Pain) (Bilateral) (R>L) (midline) w/ sciatica (Bilateral) 08/07/2016   Chronic upper back pain (2ry area of Pain) (Bilateral) (L>R) 08/07/2016   Chronic abdominal pain (Right lower quadrant) 08/07/2016   Thoracic radiculitis (Bilateral: T10, T11) 08/07/2016   Chronic occipital neuralgia (3ry area of Pain) (Bilateral) (L>R) 08/07/2016   Chronic neck pain 08/07/2016   Chronic cervical radicular pain (Bilateral) (L>R) 08/07/2016   Chronic shoulder blade pain (Bilateral) (L>R) 08/07/2016   Chronic upper extremity pain (Bilateral) (R>L) 08/07/2016   Chronic knee pain (Bilateral) (R>L) 08/07/2016   Chronic ankle pain (Bilateral) 08/07/2016   Cervical spondylosis with myelopathy and radiculopathy 08/07/2016   Panic disorder with agoraphobia 05/29/2016   Depression, unspecified depression type 05/29/2016   Atypical lymphocytosis 05/01/2016   Vitamin D insufficiency 05/01/2016   Chronic lower extremity cramps (Bilateral) (R>L) 04/29/2016   Obesity, Class III, BMI 40-49.9 (morbid obesity) ([place]) 04/29/2016   GAD (generalized anxiety disorder) 04/29/2016   Fatigue 04/29/2016   Insomnia 07/12/2015   Migraine without aura and with status migrainosus, not intractable 07/12/2015   Chronic superficial gastritis 06/02/2015   Chronic pain of multiple joints 05/15/2015   Bilateral leg edema 05/02/2015   Paroxysmal supraventricular tachycardia ([place]) 04/17/2015   Exertional shortness of breath 04/17/2015   Bright red rectal bleeding 04/06/2015   DDD (degenerative disc disease), lumbosacral 01/24/2014   DDD  (degenerative disc disease), lumbar 01/24/2014   Cervico-occipital neuralgia 12/29/2013   Fibromyalgia 12/29/2013   Migraine headache 12/29/2013   Menorrhagia 12/10/2012   Depression, major, recurrent, in remission ([place]) 01/12/2009   Chest pain 01/12/2009   Hypertension, benign essential, goal below 140/90 06/23/2008   History of PSVT (paroxysmal supraventricular tachycardia) 06/17/2008   Obstructive sleep apnea 06/17/2008   GERD 06/13/2008      Current Outpatient Medications:    amitriptyline (ELAVIL) 100 MG tablet, Take 1 tablet (100 mg total) by mouth at bedtime., Disp: 90 tablet, Rfl: 3   atorvastatin (LIPITOR) 40 MG tablet, TAKE 1 TABLET (40 MG TOTAL) BY MOUTH AT BEDTIME., Disp: 90 tablet, Rfl: 3   baclofen (LIORESAL) 10 MG tablet, Take 0.5-1 tablets (5-10 mg total) by mouth 3 (three) times daily. Each refill must last 30 days., Disp: 270 tablet, Rfl: 1   butalbital-acetaminophen-caffeine (BAC) 50-325-40 MG tablet, Take 1 tablet by mouth every 4 (four) hours as needed for migraine., Disp: 60 tablet, Rfl: 3   cyanocobalamin (,VITAMIN B-12,) 1000 MCG/ML injection, INJECT 1 ML (1,000 MCG) INTO THE MUSCLE ONCE FOR 1 DOSE, Disp: 1 mL, Rfl: 3   diclofenac Sodium (VOLTAREN) 1 % GEL, Apply topically., Disp: , Rfl:    dicyclomine (BENTYL) 10 MG capsule, Take by mouth., Disp: , Rfl:    doxycycline (VIBRA-TABS) 100 MG tablet, Take by mouth., Disp: , Rfl:    Eptinezumab-jjmr (VYEPTI) 100 MG/ML injection, Inject 1 mL (100 mg total) into the vein every 3 (three) months., Disp: 1 mL, Rfl: 3   HYDROcodone-acetaminophen (NORCO) 7.5-325 MG tablet, Take 1  tablet by mouth 2 (two) times daily as needed for severe pain. Must last 30 days, Disp: 25 tablet, Rfl: 0   [START ON 11/17/2022] HYDROcodone-acetaminophen (NORCO) 7.5-325 MG tablet, Take 1 tablet by mouth 2 (two) times daily as needed for severe pain. Must last 30 days, Disp: 25 tablet, Rfl: 0   lipase/protease/amylase (CREON) 12000-38000 units CPEP  capsule, Take 2 capsules by mouth with the first bite of each meal and take 1 capsule before snacks. (Max of 8 capsules per day)., Disp: 270 capsule, Rfl: 1   LORazepam (ATIVAN) 1 MG tablet, Take by mouth., Disp: , Rfl:    meclizine (ANTIVERT) 12.5 MG tablet, Take 1 tablet (12.5 mg total) by mouth 3 (three) times daily as needed for dizziness., Disp: 30 tablet, Rfl: 0   medroxyPROGESTERone (PROVERA) 5 MG tablet, TAKE 1 TABLET BY MOUTH DAILY, Disp: 90 tablet, Rfl: 1   metFORMIN (GLUCOPHAGE) 1000 MG tablet, Take 1 tablet (1,000 mg total) by mouth 2 (two) times daily with a meal., Disp: 180 tablet, Rfl: 1   metoprolol tartrate (LOPRESSOR) 50 MG tablet, TAKE 1 AND 1/2 TABLET BY MOUTH 2 TIMES DAILY, Disp: 135 tablet, Rfl: 3   naloxone (NARCAN) nasal spray 4 mg/0.1 mL, Place 1 spray into the nose as needed for up to 365 doses (for opioid-induced respiratory depresssion). In case of emergency (overdose), spray once into each nostril. If no response within 3 minutes, repeat application and call [phone]., Disp: 1 each, Rfl: 0   Needles & Syringes MISC, For administration of B12 injections, Disp: 30 each, Rfl: 0   ondansetron (ZOFRAN) 4 MG tablet, Take 1 tablet (4 mg total) by mouth every 8 (eight) hours as needed., Disp: 20 tablet, Rfl: 2   polyethylene glycol-electrolytes (NULYTELY) 420 g solution, Prepare according to package instructions. Starting at 5:00 PM: Drink one 8 oz glass of mixture every 15 minutes until you finish half of the jug. Five hours prior to procedure, drink 8 oz glass of mixture every 15 minutes until it is all gone. Make sure you do not drink anything 4 hours prior to your procedure., Disp: 4000 mL, Rfl: 0   pregabalin (LYRICA) 150 MG capsule, TAKE 1 CAPSULE BY MOUTH EVERY 8 HOURS, Disp: 270 capsule, Rfl: 3   Rimegepant Sulfate 75 MG TBDP, Take 1 tablet by mouth every other day., Disp: , Rfl:    rizatriptan (MAXALT) 10 MG tablet, Take 1 tablet (10 mg total) by mouth as needed for migraine. May  repeat in 2 hours if needed, Disp: 10 tablet, Rfl: 3   scopolamine (TRANSDERM-SCOP) 1 MG/3DAYS, Place 1 patch (1.5 mg total) onto the skin every 3 (three) days., Disp: 10 patch, Rfl: 12   sertraline (ZOLOFT) 25 MG tablet, Take 1 tablet (25 mg total) by mouth at bedtime., Disp: 30 tablet, Rfl: 3   VALERIAN PO, Take by mouth as needed. Makes Valerian tea about 3-4 times per week., Disp: , Rfl:    Vitamin D, Ergocalciferol, (DRISDOL) 1.25 MG (50000 UNIT) CAPS capsule, Take 1 capsule (50,000 Units total) by mouth every 7 (seven) days., Disp: 7 capsule, Rfl: 0   diclofenac Sodium (VOLTAREN) 1 % GEL, Apply 2 g topically 4 (four) times daily as needed., Disp: 350 g, Rfl: PRN   dicyclomine (BENTYL) 10 MG capsule, Take 1 capsule (10 mg total) by mouth 4 (four) times daily as needed (before meals and at bedtime), Disp: 360 capsule, Rfl: 1   HYDROcodone-acetaminophen (NORCO) 7.5-325 MG tablet, Take 1 tablet by mouth  2 (two) times daily as needed for severe pain. Must last 30 days, Disp: 25 tablet, Rfl: 0   Allergies  Allergen Reactions   Aspirin Swelling   Cephalexin Rash   Cymbalta [Duloxetine Hcl] Other (See Comments)    Suicidal ideations and has homicidal thoughts per patient   Depakote [Divalproex Sodium] Shortness Of Breath    W/ n/v   Gadolinium Derivatives     Pt was unable to breath Other reaction(s): Other (See Comments) Pt was unable to breath   Haloperidol Shortness Of Breath    W/ n/v   Meperidine Nausea And Vomiting    Patient projectile vomits and usually result in ER Other reaction(s): Vomiting   Metoclopramide Shortness Of Breath    Can't breath, wheezes Other reaction(s): Other (See Comments)   Morphine     Other reaction(s): Vomiting   Penicillins Rash    Other reaction(s): Unknown   Prochlorperazine Other (See Comments)    Panic attack Other reaction(s): Other (See Comments)   Tramadol Hcl Palpitations    Severely and adversely affects her SVT giving her tachycardias of  150-160 bpm.   Trazodone Shortness Of Breath    Other reaction(s): Other (See Comments)   Meloxicam Other (See Comments)    mouth sores, tingling, blisters in mouth Other reaction(s): Other (See Comments) mout sores, tingling mouth sores, tingling, blisters in mouth   Neomycin-Bacitracin Zn-Polymyx Rash   Tomato Hives    Tongue will blister   Eggs Or Egg-Derived Products Other (See Comments)    inflammation   Milk-Related Compounds Other (See Comments)    inflammation   Other     Other reaction(s): Other (See Comments)   Shellfish Allergy     Other reaction(s): Unknown   Shellfish-Derived Products Other (See Comments)    Other reaction(s): Unknown Other reaction(s): Unknown   Bacitra-Neomycin-Polymyxin-Hc Rash   Bacitracin-Neomycin-Polymyxin Rash   Cephalosporins Rash    rash   Ibuprofen Other (See Comments) and Rash    Blisters in mouth. Blisters in mouth.   Latex Itching    Other reaction(s): Unknown   Nsaids Other (See Comments)    Blisters in mouth; can take 1 ibuprofen 2x a month Other reaction(s): Other (See Comments), Unknown Blisters in mouth; can take 1 ibuprofen 2x a month   Sulfa Antibiotics Rash    rash Other reaction(s): Unknown rash  Other reaction(s): Unknown rash rash Other reaction(s): Unknown rash   Sulfonamide Derivatives Rash     Social History   Tobacco Use   Smoking status: Former    Packs/day: 2.00    Years: 3.00    Total pack years: 6.00    Types: Cigarettes    Quit date: 12/10/1992    Years since quitting: 29.9   Smokeless tobacco: Never   Tobacco comments:    quit 25 years ago  Vaping Use   Vaping Use: Never used  Substance Use Topics   Alcohol use: No    Comment: socially   Drug use: No      Chart Review Today: I personally reviewed active problem list, medication list, allergies, family history, social history, health maintenance, notes from last encounter, lab results, imaging with the patient/caregiver  today.   Review of Systems  Constitutional: Negative.   HENT: Negative.    Eyes: Negative.   Respiratory: Negative.    Cardiovascular: Negative.   Gastrointestinal: Negative.   Endocrine: Negative.   Genitourinary: Negative.   Musculoskeletal: Negative.   Skin: Negative.   Allergic/Immunologic: Negative.  Neurological: Negative.   Hematological: Negative.   Psychiatric/Behavioral: Negative.    All other systems reviewed and are negative.      Objective:   Vitals:   11/15/22 1116  BP: 124/76  Pulse: 92  Resp: 16  Temp: 98.2 F (36.8 C)  TempSrc: Oral  SpO2: 98%  Weight: 223 lb 11.2 oz (101.5 kg)  Height: 5' 3"$  (1.6 m)    Body mass index is 39.63 kg/m.  Physical Exam Vitals and nursing note reviewed.  Constitutional:      General: She is not in acute distress.    Appearance: Normal appearance. She is obese. She is not ill-appearing, toxic-appearing or diaphoretic.  HENT:     Head: Normocephalic and atraumatic.     Right Ear: Tympanic membrane, ear canal and external ear normal.     Left Ear: Tympanic membrane, ear canal and external ear normal.     Nose: Nose normal.     Mouth/Throat:     Lips: Pink.     Mouth: Mucous membranes are dry. No oral lesions.     Dentition: Dental caries present. No dental abscesses.     Tongue: No lesions.     Palate: No mass and lesions.     Pharynx: Uvula midline. Posterior oropharyngeal erythema present. No pharyngeal swelling, oropharyngeal exudate or uvula swelling.     Tonsils: No tonsillar exudate.  Neck:     Thyroid: Thyroid mass, thyromegaly and thyroid tenderness present.     Trachea: Trachea and phonation normal.   Cardiovascular:     Rate and Rhythm: Normal rate and regular rhythm.     Pulses: Normal pulses.     Heart sounds: Normal heart sounds.  Pulmonary:     Effort: Pulmonary effort is normal.     Breath sounds: Normal breath sounds.  Musculoskeletal:     Cervical back: Normal range of motion. No edema,  erythema or rigidity. Normal range of motion.  Lymphadenopathy:     Cervical: Cervical adenopathy present.     Right cervical: Superficial cervical adenopathy, deep cervical adenopathy and posterior cervical adenopathy present.  Neurological:     Mental Status: She is alert.  Psychiatric:        Mood and Affect: Mood normal.        Behavior: Behavior normal.      Results for orders placed or performed during the hospital encounter of 10/02/22  Glucose, capillary  Result Value Ref Range   Glucose-Capillary 165 (H) 70 - 99 mg/dL   *Note: Due to a large number of results and/or encounters for the requested time period, some results have not been displayed. A complete set of results can be found in Results Review.       Assessment & Plan:     ICD-10-CM   1. Pharyngitis, unspecified etiology  J02.9 POCT rapid strep A    azithromycin (ZITHROMAX) 250 MG tablet    2. Neck pain  M54.2 POCT rapid strep A    3. Thyromegaly  E01.0    will determine which imaging is indicated after pt f/up in 1-2 weeks    4. Neck mass  R22.1    see below     Pt is 52 y/o female presents with neck pain/swelling/mass? And sore throat onset yesterday Pt positive for strep - will tx with abx (multiple allergies) - reviewed algorithm and azithromycin was sent in She may have goiter? Thyromegaly/thyroiditis vs mass vs lymphadenopathy Will do f/up on her neck/mass/swelling in 1-2 weeks  I expect the tenderness to improve and if it does I will likely order thyroid US - if pain/swelling persists may get CT neck first vs thyroid AND soft tissue US   Delsa Grana, PA-C 11/15/22 11:46 AM

## 2022-11-18 ENCOUNTER — Encounter: Payer: Self-pay | Admitting: Gastroenterology

## 2022-11-25 ENCOUNTER — Encounter: Payer: Self-pay | Admitting: Plastic Surgery

## 2022-11-25 ENCOUNTER — Telehealth: Payer: Self-pay

## 2022-11-25 ENCOUNTER — Ambulatory Visit: Payer: Medicaid Other | Admitting: Plastic Surgery

## 2022-11-25 VITALS — BP 123/75 | HR 98 | Ht 63.0 in | Wt 229.2 lb

## 2022-11-25 DIAGNOSIS — M6208 Separation of muscle (nontraumatic), other site: Secondary | ICD-10-CM | POA: Diagnosis not present

## 2022-11-25 NOTE — Telephone Encounter (Signed)
Insurance Treatment Denial Note  Date order was entered:  Order entered by: Milinda Pointer, MD Requested treatment: lesi Reason for denial: Treatment not covered by plan. Recommended for approval:  does not meet criteria   Doesn't meet criteria, according to [employer]. I called the patient and let her know. She is going to appeal.

## 2022-11-25 NOTE — Progress Notes (Signed)
Referring Provider Delsa Grana, PA-C [address] [place],  [place] [address]   CC:  Chief Complaint  Patient presents with   Consult      Lilybelle Stasiak is an 52 y.o. female.  HPI: Ms. 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  Patient states that the diastases has been there for many years since the birth of her children.  She does note that she has discomfort in the area but is being seen by her gastroenterologist for constipation and he questions whether the rectus diastases may contribute to it.  Allergies  Allergen Reactions   Aspirin Swelling   Cephalexin Rash   Cymbalta [Duloxetine Hcl] Other (See Comments)    Suicidal ideations and has homicidal thoughts per patient   Depakote [Divalproex Sodium] Shortness Of Breath    W/ n/v   Gadolinium Derivatives     Pt was unable to breath Other reaction(s): Other (See Comments) Pt was unable to breath   Haloperidol Shortness Of Breath    W/ n/v   Meperidine Nausea And Vomiting    Patient projectile vomits and usually result in ER Other reaction(s): Vomiting   Metoclopramide Shortness Of Breath    Can't breath, wheezes Other reaction(s): Other (See Comments)   Morphine     Other reaction(s): Vomiting   Penicillins Rash    Other reaction(s): Unknown   Prochlorperazine Other (See Comments)    Panic attack Other reaction(s): Other (See Comments)   Tramadol Hcl Palpitations    Severely and adversely affects her SVT giving her tachycardias of 150-160 bpm.   Trazodone Shortness Of Breath    Other reaction(s): Other (See Comments)   Meloxicam Other (See Comments)    mouth sores, tingling, blisters in mouth Other reaction(s): Other (See Comments) mout sores, tingling mouth sores, tingling, blisters in mouth   Neomycin-Bacitracin Zn-Polymyx Rash   Tomato Hives    Tongue will blister   Eggs Or Egg-Derived Products Other (See Comments)     inflammation   Milk-Related Compounds Other (See Comments)    inflammation   Other     Other reaction(s): Other (See Comments)   Shellfish Allergy     Other reaction(s): Unknown   Shellfish-Derived Products Other (See Comments)    Other reaction(s): Unknown Other reaction(s): Unknown   Bacitra-Neomycin-Polymyxin-Hc Rash   Bacitracin-Neomycin-Polymyxin Rash   Cephalosporins Rash    rash   Ibuprofen Other (See Comments) and Rash    Blisters in mouth. Blisters in mouth.   Latex Itching    Other reaction(s): Unknown   Nsaids Other (See Comments)    Blisters in mouth; can take 1 ibuprofen 2x a month Other reaction(s): Other (See Comments), Unknown Blisters in mouth; can take 1 ibuprofen 2x a month   Sulfa Antibiotics Rash    rash Other reaction(s): Unknown rash  Other reaction(s): Unknown rash rash Other reaction(s): Unknown rash   Sulfonamide Derivatives Rash    Outpatient Encounter Medications as of 11/25/2022  Medication Sig Note   amitriptyline (ELAVIL) 100 MG tablet Take 1 tablet (100 mg total) by mouth at bedtime.    atorvastatin (LIPITOR) 40 MG tablet TAKE 1 TABLET (40 MG TOTAL) BY MOUTH AT BEDTIME.    baclofen (LIORESAL) 10 MG tablet Take 0.5-1 tablets (5-10 mg total) by mouth 3 (three) times daily. Each refill must last 30 days.    butalbital-acetaminophen-caffeine (BAC) 50-325-40 MG tablet Take 1 tablet by mouth every 4 (  four) hours as needed for migraine.    cyanocobalamin (,VITAMIN B-12,) 1000 MCG/ML injection INJECT 1 ML (1,000 MCG) INTO THE MUSCLE ONCE FOR 1 DOSE    diclofenac Sodium (VOLTAREN) 1 % GEL Apply topically.    dicyclomine (BENTYL) 10 MG capsule Take by mouth.    doxycycline (VIBRA-TABS) 100 MG tablet Take by mouth.    Eptinezumab-jjmr (VYEPTI) 100 MG/ML injection Inject 1 mL (100 mg total) into the vein every 3 (three) months.    HYDROcodone-acetaminophen (NORCO) 7.5-325 MG tablet Take 1 tablet by mouth 2 (two) times daily as needed for severe  pain. Must last 30 days 09/18/2022: WARNING: Not a Duplicate. Future prescription. DO NOT DELETE during hospital medication reconciliation or at discharge. [facility] Patient   lipase/protease/amylase (CREON) 12000-38000 units CPEP capsule Take 2 capsules by mouth with the first bite of each meal and take 1 capsule before snacks. (Max of 8 capsules per day).    meclizine (ANTIVERT) 12.5 MG tablet Take 1 tablet (12.5 mg total) by mouth 3 (three) times daily as needed for dizziness.    medroxyPROGESTERone (PROVERA) 5 MG tablet TAKE 1 TABLET BY MOUTH DAILY    metFORMIN (GLUCOPHAGE) 1000 MG tablet Take 1 tablet (1,000 mg total) by mouth 2 (two) times daily with a meal.    metoprolol tartrate (LOPRESSOR) 50 MG tablet TAKE 1 AND 1/2 TABLET BY MOUTH 2 TIMES DAILY    naloxone (NARCAN) nasal spray 4 mg/0.1 mL Place 1 spray into the nose as needed for up to 365 doses (for opioid-induced respiratory depresssion). In case of emergency (overdose), spray once into each nostril. If no response within 3 minutes, repeat application and call [phone].    Needles & Syringes MISC For administration of B12 injections    ondansetron (ZOFRAN) 4 MG tablet Take 1 tablet (4 mg total) by mouth every 8 (eight) hours as needed.    pregabalin (LYRICA) 150 MG capsule TAKE 1 CAPSULE BY MOUTH EVERY 8 HOURS    Rimegepant Sulfate 75 MG TBDP Take 1 tablet by mouth every other day.    rizatriptan (MAXALT) 10 MG tablet Take 1 tablet (10 mg total) by mouth as needed for migraine. May repeat in 2 hours if needed    scopolamine (TRANSDERM-SCOP) 1 MG/3DAYS Place 1 patch (1.5 mg total) onto the skin every 3 (three) days.    sertraline (ZOLOFT) 25 MG tablet Take 1 tablet (25 mg total) by mouth at bedtime.    VALERIAN PO Take by mouth as needed. Makes Valerian tea about 3-4 times per week.    Vitamin D, Ergocalciferol, (DRISDOL) 1.25 MG (50000 UNIT) CAPS capsule Take 1 capsule (50,000 Units total) by mouth every 7 (seven) days.     diclofenac Sodium (VOLTAREN) 1 % GEL Apply 2 g topically 4 (four) times daily as needed.    dicyclomine (BENTYL) 10 MG capsule Take 1 capsule (10 mg total) by mouth 4 (four) times daily as needed (before meals and at bedtime)    HYDROcodone-acetaminophen (NORCO) 7.5-325 MG tablet Take 1 tablet by mouth 2 (two) times daily as needed for severe pain. Must last 30 days    HYDROcodone-acetaminophen (NORCO) 7.5-325 MG tablet Take 1 tablet by mouth 2 (two) times daily as needed for severe pain. Must last 30 days    LORazepam (ATIVAN) 1 MG tablet Take by mouth. (Patient not taking: Reported on 11/25/2022)    polyethylene glycol-electrolytes (NULYTELY) 420 g solution Prepare according to package instructions. Starting at 5:00 PM: Drink one  8 oz glass of mixture every 15 minutes until you finish half of the jug. Five hours prior to procedure, drink 8 oz glass of mixture every 15 minutes until it is all gone. Make sure you do not drink anything 4 hours prior to your procedure.    No facility-administered encounter medications on file as of 11/25/2022.     Past Medical History:  Diagnosis Date   Acute postoperative pain 04/07/2017   Anxiety    Bursitis    Chronic fatigue 12/12/2017   Chronic fatigue syndrome    Colitis 2021   Diabetes mellitus without complication ([place])    Edema leg 05/02/2015   Fibromyalgia    GERD (gastroesophageal reflux disease)    IBS (irritable bowel syndrome)    Knee pain, bilateral 12/21/2008   Qualifier: Diagnosis of  By: Hassell Done FNP, [place]     Lumbar discitis    Migraines    Osteoarthritis    Right hand pain 04/10/2015   [facility] has done nerve conduction studies and ruled out carpal tunnel.    Sleep apnea    Spinal stenosis    SVT (supraventricular tachycardia)    Vertigo    Vitamin D deficiency 05/01/2016    Past Surgical History:  Procedure Laterality Date   ABLATION     Uterine   BREAST BIOPSY Left    2014 U/S bx fibroadenoma   CARDIAC  CATHETERIZATION     with ablation   COLONOSCOPY N/A 09/26/2022   Procedure: COLONOSCOPY;  Surgeon: Jonathon Bellows, MD;  Location: [facility] ENDOSCOPY;  Service: Gastroenterology;  Laterality: N/A;   COLONOSCOPY WITH PROPOFOL N/A 05/17/2015   Procedure: COLONOSCOPY WITH PROPOFOL;  Surgeon: Manya Silvas, MD;  Location: [facility] ENDOSCOPY;  Service: Endoscopy;  Laterality: N/A;   COLONOSCOPY WITH PROPOFOL N/A 03/20/2020   Procedure: COLONOSCOPY WITH PROPOFOL;  Surgeon: Jonathon Bellows, MD;  Location: [facility] ENDOSCOPY;  Service: Gastroenterology;  Laterality: N/A;   COLONOSCOPY WITH PROPOFOL N/A 09/25/2022   Procedure: COLONOSCOPY WITH PROPOFOL;  Surgeon: Jonathon Bellows, MD;  Location: [facility] ENDOSCOPY;  Service: Gastroenterology;  Laterality: N/A;   COLONOSCOPY WITH PROPOFOL N/A 10/02/2022   Procedure: COLONOSCOPY WITH PROPOFOL;  Surgeon: Jonathon Bellows, MD;  Location: [facility] ENDOSCOPY;  Service: Gastroenterology;  Laterality: N/A;   COLONOSCOPY WITH PROPOFOL N/A 11/13/2022   Procedure: COLONOSCOPY WITH PROPOFOL;  Surgeon: Jonathon Bellows, MD;  Location: [facility] ENDOSCOPY;  Service: Gastroenterology;  Laterality: N/A;   ESOPHAGOGASTRODUODENOSCOPY N/A 05/17/2015   Procedure: ESOPHAGOGASTRODUODENOSCOPY (EGD);  Surgeon: Manya Silvas, MD;  Location: [facility] ENDOSCOPY;  Service: Endoscopy;  Laterality: N/A;   ESOPHAGOGASTRODUODENOSCOPY N/A 09/25/2022   Procedure: ESOPHAGOGASTRODUODENOSCOPY (EGD);  Surgeon: Jonathon Bellows, MD;  Location: [facility] ENDOSCOPY;  Service: Gastroenterology;  Laterality: N/A;   ESOPHAGOGASTRODUODENOSCOPY N/A 09/26/2022   Procedure: ESOPHAGOGASTRODUODENOSCOPY (EGD);  Surgeon: Jonathon Bellows, MD;  Location: [facility] ENDOSCOPY;  Service: Gastroenterology;  Laterality: N/A;   KNEE ARTHROSCOPY     spg     6/18   Tibial Tubercle Bypass Right 1998   TUBAL LIGATION  10/01/1999    Family History  Problem Relation Age of Onset   Depression Mother    Hypertension Mother    Cancer Mother        Skin   Hyperlipidemia Mother     Anxiety disorder Mother    Migraines Mother    Alcohol abuse Father    Depression Father    Stroke Father    Heart disease Father    Hypertension Father    Anxiety disorder Father  Depression Sister    Hyperlipidemia Sister    Diabetes Sister    Hypertension Sister    Polycystic ovary syndrome Sister    Bipolar disorder Sister    Anxiety disorder Sister    Migraines Sister    Depression Sister    Hypertension Sister    Anxiety disorder Sister    Migraines Sister    Breast cancer Maternal Grandmother 21   Cancer Maternal Grandmother 63       Breast   Thyroid disease Maternal Grandmother    Arthritis Maternal Grandmother    Hyperlipidemia Maternal Grandmother    Aneurysm Maternal Grandfather    Hypertension Maternal Grandfather    Heart disease Maternal Grandfather    Alzheimer's disease Paternal Grandmother    Heart attack Paternal Grandfather    Hypertension Paternal Grandfather    COPD Paternal Grandfather    Heart disease Paternal Grandfather    Migraines Daughter    Other Daughter        leomyoa scaroma   Migraines Daughter    Migraines Son    Alzheimer's disease Other    Bladder Cancer Neg Hx    Kidney cancer Neg Hx     Social History   Social History Narrative   Lives at home with her husband and 2 of her children   Right handed   Caffeine: 0-2 cups daily     Review of Systems General: Denies fevers, chills, weight loss CV: Denies chest pain, shortness of breath, palpitations Abdomen: Bulge in the midline with Valsalva  Physical Exam    11/25/2022   10:16 AM 11/15/2022   11:16 AM 11/13/2022   11:41 AM  Vitals with BMI  Height '5\' 3"'$  '5\' 3"'$    Weight 229 lbs 3 oz 223 lbs 11 oz   BMI [id number]   Systolic [id number]  Diastolic 75 76 76  Pulse 98 92 86    General:  No acute distress,  Alert and oriented, Non-Toxic, Normal speech and affect Abdomen: Patient has a round soft nontender abdomen.  With head raise she has a modest rectus diastases  which measures approximately 6 cm.  No hernia is palpated. Mammogram: Last mammogram documented 2022 was BI-RADS 1 Assessment/Plan Rectus diastases: Patient has a modest rectus diastases at approximately 5 to 6 cm in width.  It reduces as soon as she releases Valsalva there is no evidence of additional hernia. We discussed repair of rectus diastases at length.  The repair is purely cosmetic as there is no evidence that repair improves abdominal wall function.  We did discuss core strengthening and pelvic floor strengthening as ways to improve the rectus diastases.  This may also result in improvement of her constipation. No surgery is scheduled at this time and the patient may follow-up as needed.  Camillia Herter 11/25/2022, 11:06 AM

## 2022-11-27 ENCOUNTER — Other Ambulatory Visit (HOSPITAL_COMMUNITY): Payer: Self-pay

## 2022-11-29 ENCOUNTER — Other Ambulatory Visit (HOSPITAL_COMMUNITY): Payer: Self-pay

## 2022-12-02 ENCOUNTER — Other Ambulatory Visit (HOSPITAL_COMMUNITY): Payer: Self-pay

## 2022-12-03 ENCOUNTER — Ambulatory Visit
Admission: RE | Admit: 2022-12-03 | Discharge: 2022-12-03 | Disposition: A | Discharge status: Home / Self Care | Payer: Medicaid Other | Source: Ambulatory Visit | Attending: Physical Medicine and Rehabilitation | Admitting: Physical Medicine and Rehabilitation

## 2022-12-03 DIAGNOSIS — K56609 Unspecified intestinal obstruction, unspecified as to partial versus complete obstruction: Secondary | ICD-10-CM

## 2022-12-03 DIAGNOSIS — K760 Fatty (change of) liver, not elsewhere classified: Secondary | ICD-10-CM | POA: Diagnosis not present

## 2022-12-06 DIAGNOSIS — G4733 Obstructive sleep apnea (adult) (pediatric): Secondary | ICD-10-CM | POA: Diagnosis not present

## 2022-12-09 ENCOUNTER — Other Ambulatory Visit: Payer: Self-pay | Admitting: Family Medicine

## 2022-12-09 ENCOUNTER — Ambulatory Visit: Payer: Medicaid Other | Admitting: Family Medicine

## 2022-12-09 ENCOUNTER — Encounter: Payer: Self-pay | Admitting: Family Medicine

## 2022-12-09 VITALS — BP 126/72 | HR 113 | Temp 98.2°F | Resp 16 | Ht 63.0 in | Wt 227.0 lb

## 2022-12-09 DIAGNOSIS — R1310 Dysphagia, unspecified: Secondary | ICD-10-CM | POA: Diagnosis not present

## 2022-12-09 DIAGNOSIS — R221 Localized swelling, mass and lump, neck: Secondary | ICD-10-CM | POA: Diagnosis not present

## 2022-12-09 DIAGNOSIS — E782 Mixed hyperlipidemia: Secondary | ICD-10-CM

## 2022-12-09 DIAGNOSIS — E010 Iodine-deficiency related diffuse (endemic) goiter: Secondary | ICD-10-CM | POA: Diagnosis not present

## 2022-12-09 NOTE — Progress Notes (Unsigned)
PROVIDER NOTE: Information contained herein reflects review and annotations entered in association with encounter. Interpretation of such information and data should be left to medically-trained personnel. Information provided to patient can be located elsewhere in the medical record under "Patient Instructions". Document created using STT-dictation technology, any transcriptional errors that may result from process are unintentional.    Patient: Dana Bradley  Service Category: E/M  Provider: Gaspar Cola, MD  DOB: 11-28-1970  DOS: 12/11/2022  Referring Provider: Laurell Roof  MRN: [id number]  Specialty: Interventional Pain Management  PCP: Delsa Grana, PA-C  Type: Established Patient  Setting: Ambulatory outpatient    Location: Office  Delivery: Face-to-face     HPI  Ms. Arabelle Vanduyne, a 52 y.o. year old female, is here today because of her No primary diagnosis found.. Ms. Doty's primary complain today is No chief complaint on file.  Pertinent problems: Ms. Kellen has Cervico-occipital neuralgia; DDD (degenerative disc disease), lumbosacral; Chronic pain of multiple joints; DDD (degenerative disc disease), lumbar; Fibromyalgia; Migraine without aura and with status migrainosus, not intractable; Chronic lower extremity cramps (Bilateral) (R>L); Chronic pain syndrome; Neurogenic pain; Chronic low back pain (1ry area of Pain) (Bilateral) (R>L) (midline) w/ sciatica (Bilateral); Chronic upper back pain (2ry area of Pain) (Bilateral) (L>R); Chronic abdominal pain (Right lower quadrant); Thoracic radiculitis (Bilateral: T10, T11); Chronic occipital neuralgia (3ry area of Pain) (Bilateral) (L>R); Chronic neck pain; Chronic cervical radicular pain (Bilateral) (L>R); Chronic shoulder blade pain (Bilateral) (L>R); Chronic upper extremity pain (Bilateral) (R>L); Chronic knee pain (Bilateral) (R>L); Chronic ankle pain (Bilateral); Cervical spondylosis with myelopathy and radiculopathy; Nephrolithiasis; Plantar  fasciitis of left foot; Cervicogenic headache; Bilateral leg edema; Chronic hip pain (Left); Chronic sacroiliac joint pain (Left); Lumbar facet syndrome (Bilateral) (L>R); Lumbar radiculitis (Left); Lumbar spondylosis; Migraine headache; Muscle spasticity; Osteoarthritis of shoulder (Bilateral); Lumbar L1-2 disc protrusion (Right); Myofascial pain syndrome (Left) (trapezius muscle); Trigger point of shoulder region (Left); Chronic fatigue syndrome with fibromyalgia; Occipital headache; Trigger point of neck (Left); Malar rash; Chronic migraine without aura, with intractable migraine, so stated, with status migrainosus; Weakness of leg (Left); Difficulty walking; Migraine with aura and with status migrainosus, not intractable; Cervico-occipital neuralgia (Left); DDD (degenerative disc disease), cervical; Cervical facet syndrome (Bilateral) (L>R); DDD (degenerative disc disease), thoracic; Osteoarthritis of hip (Left); Chronic groin pain (Bilateral) (L>R); Chronic hip pain (Bilateral) (L>R); Somatic dysfunction of sacroiliac joint (Bilateral); Other specified dorsopathies, sacral and sacrococcygeal region; Intractable migraine with aura without status migrainosus; Chronic lower extremity pain (Bilateral); Lumbar radiculitis (Right); Neurogenic urinary incontinence; Other intervertebral disc degeneration, lumbar region; Spinal stenosis of lumbosacral region; Hyperalgesia; Abnormal MRI, lumbar spine (12/27/2021); Chronic midline thoracic back pain; Abnormal MRI, thoracic spine (08/02/2019); Thoracic spinal stenosis (T7-8); Prolapse of thoracic disc with radiculopathy (T7-8); Spasm of muscle of lower back; Cervicalgia; Abnormal MRI, cervical spine (02/14/2021); Lateral epicondylitis, left elbow; Neurogenic bladder; Urinary and fecal incontinence; and Herniated nucleus pulposus, L3-4 (Right) on their pertinent problem list. Pain Assessment: Severity of   is reported as a  /10. Location:    / . Onset:  . Quality:  .  Timing:  . Modifying factor(s):  Marland Kitchen Vitals:  vitals were not taken for this visit.  BMI: Estimated body mass index is 40.6 kg/m as calculated from the following:   Height as of 11/25/22: '5\' 3"'$  (1.6 m).   Weight as of 11/25/22: 229 lb 3.2 oz (104 kg). Last encounter: 11/12/2022. Last procedure: 10/29/2022.  Reason for encounter:  *** . ***  Pharmacotherapy Assessment  Analgesic:  Hydrocodone/APAP 7.5/325, 1 tab PO QD. she appears to be using less than 10 pills/month. MME/day: 3.75 mg/day.   Monitoring: [place] PMP: PDMP reviewed during this encounter.       Pharmacotherapy: No side-effects or adverse reactions reported. Compliance: No problems identified. Effectiveness: Clinically acceptable.  No notes on file  No results found for: "CBDTHCR" No results found for: "D8THCCBX" No results found for: "D9THCCBX"  UDS:  Summary  Date Value Ref Range Status  01/28/2022 Note  Final    Comment:    ==================================================================== [employer] [facility] (MW) ==================================================================== Test                             Result       Flag       Units  Drug Present and Declared for Prescription Verification   Hydromorphone                  38           EXPECTED   ng/mg creat    Hydromorphone may be administered as a scheduled prescription    medication; it is also an expected metabolite of hydrocodone.  Drug Absent but Declared for Prescription Verification   Lorazepam                      Not Detected UNEXPECTED ng/mg creat   Hydrocodone                    Not Detected UNEXPECTED ng/mg creat    Hydrocodone is almost always present in patients taking this drug    consistently. Absence of hydrocodone could be due to lapse of time    since the last dose or unusual pharmacokinetics (rapid metabolism).    Butalbital                     Not Detected  UNEXPECTED ==================================================================== Test                      Result    Flag   Units      Ref Range   Creatinine              135              mg/dL      >=20 ==================================================================== Declared Medications:  The flagging and interpretation on this report are based on the  following declared medications.  Unexpected results may arise from  inaccuracies in the declared medications.   **Note: The testing scope of this panel includes these medications:   Butalbital  Hydrocodone (Norco)  Lorazepam (Ativan)   **Note: The testing scope of this panel does not include the  following reported medications:   Acetaminophen  Acetaminophen (Norco)  Amitriptyline (Elavil)  Atorvastatin (Lipitor)  Baclofen (Lioresal)  Caffeine  Carbamazepine (Tegretol)  Diclofenac (Voltaren)  Dicyclomine (Bentyl)  Dihydroergotamine (Migranal)  Meclizine (Antivert)  Medroxyprogesterone (Provera)  Metformin  Metoprolol  Omeprazole  Ondansetron (Zofran)  Pancrelipase (Creon)  Pregabalin (Lyrica)  Rimegepant (Nurtec)  Vitamin B12 ==================================================================== For clinical consultation, please call [phone]. ====================================================================       ROS  Constitutional: Denies any fever or chills Gastrointestinal: No reported hemesis, hematochezia, vomiting, or acute GI distress Musculoskeletal: Denies any acute onset joint swelling, redness, loss of ROM, or weakness Neurological: No reported episodes of acute onset apraxia, aphasia, dysarthria, agnosia, amnesia, paralysis, loss of  coordination, or loss of consciousness  Medication Review  Eptinezumab-jjmr, HYDROcodone-acetaminophen, LORazepam, Needles & Syringes, Rimegepant Sulfate, Valerian, Vitamin D (Ergocalciferol), amitriptyline, atorvastatin, baclofen,  butalbital-acetaminophen-caffeine, cyanocobalamin, diclofenac Sodium, dicyclomine, doxycycline, lipase/protease/amylase, meclizine, medroxyPROGESTERone, metFORMIN, metoprolol tartrate, naloxone, ondansetron, polyethylene glycol-electrolytes, pregabalin, rizatriptan, scopolamine, and sertraline  History Review  Allergy: Ms. Potenza is allergic to aspirin, cephalexin, cymbalta [duloxetine hcl], depakote [divalproex sodium], gadolinium derivatives, haloperidol, meperidine, metoclopramide, morphine, penicillins, prochlorperazine, tramadol hcl, trazodone, meloxicam, neomycin-bacitracin zn-polymyx, tomato, egg-derived products, milk-related compounds, other, shellfish allergy, shellfish-derived products, bacitra-neomycin-polymyxin-hc, bacitracin-neomycin-polymyxin, cephalosporins, ibuprofen, latex, nsaids, sulfa antibiotics, and sulfonamide derivatives. Drug: Ms. Asamoah  reports no history of drug use. Alcohol:  reports no history of alcohol use. Tobacco:  reports that she quit smoking about 30 years ago. Her smoking use included cigarettes. She has a 6.00 pack-year smoking history. She has never used smokeless tobacco. Social: Ms. Kerley  reports that she quit smoking about 30 years ago. Her smoking use included cigarettes. She has a 6.00 pack-year smoking history. She has never used smokeless tobacco. She reports that she does not drink alcohol and does not use drugs. Medical:  has a past medical history of Acute postoperative pain (04/07/2017), Anxiety, Bursitis, Chronic fatigue (12/12/2017), Chronic fatigue syndrome, Colitis (2021), Diabetes mellitus without complication ([place]), Edema leg (05/02/2015), Fibromyalgia, GERD (gastroesophageal reflux disease), IBS (irritable bowel syndrome), Knee pain, bilateral (12/21/2008), Lumbar discitis, Migraines, Osteoarthritis, Right hand pain (04/10/2015), Sleep apnea, Spinal stenosis, SVT (supraventricular tachycardia), Vertigo, and Vitamin D deficiency  (05/01/2016). Surgical: Ms. Masoner  has a past surgical history that includes Ablation; Tubal ligation (10/01/1999); Cardiac catheterization; Knee arthroscopy; Colonoscopy with propofol (N/A, 05/17/2015); Esophagogastroduodenoscopy (N/A, 05/17/2015); spg; Tibial Tubercle Bypass (Right, 1998); Colonoscopy with propofol (N/A, 03/20/2020); Breast biopsy (Left); Colonoscopy with propofol (N/A, 09/25/2022); Esophagogastroduodenoscopy (N/A, 09/25/2022); Colonoscopy (N/A, 09/26/2022); Esophagogastroduodenoscopy (N/A, 09/26/2022); Colonoscopy with propofol (N/A, 10/02/2022); and Colonoscopy with propofol (N/A, 11/13/2022). Family: family history includes Alcohol abuse in her father; Alzheimer's disease in her paternal grandmother and another family member; Aneurysm in her maternal grandfather; Anxiety disorder in her father, mother, sister, and sister; Arthritis in her maternal grandmother; Bipolar disorder in her sister; Breast cancer (age of onset: 61) in her maternal grandmother; COPD in her paternal grandfather; Cancer in her mother; Cancer (age of onset: 76) in her maternal grandmother; Depression in her father, mother, sister, and sister; Diabetes in her sister; Heart attack in her paternal grandfather; Heart disease in her father, maternal grandfather, and paternal grandfather; Hyperlipidemia in her maternal grandmother, mother, and sister; Hypertension in her father, maternal grandfather, mother, paternal grandfather, sister, and sister; Migraines in her daughter, daughter, mother, sister, sister, and son; Other in her daughter; Polycystic ovary syndrome in her sister; Stroke in her father; Thyroid disease in her maternal grandmother.  Laboratory Chemistry Profile   Renal Lab Results  Component Value Date   BUN 14 09/06/2022   CREATININE 0.81 09/06/2022   BCR SEE NOTE: 09/06/2022   GFRAA >60 02/22/2020   GFRNONAA >60 07/06/2021    Hepatic Lab Results  Component Value Date   AST 22 09/06/2022   ALT 35  (H) 09/06/2022   ALBUMIN 4.2 07/06/2021   ALKPHOS 109 07/06/2021   HCVAB NON REACTIVE 09/21/2019   LIPASE 37 09/06/2022    Electrolytes Lab Results  Component Value Date   NA 143 09/06/2022   K 4.8 09/06/2022   CL 105 09/06/2022   CALCIUM 9.9 09/06/2022   MG 2.1 04/01/2017    Bone Lab Results  Component Value Date   VD25OH  16.49 (L) 02/22/2020   25OHVITD1 20 (L) 04/01/2017   25OHVITD2 5.4 04/01/2017   25OHVITD3 15 04/01/2017    Inflammation (CRP: Acute Phase) (ESR: Chronic Phase) Lab Results  Component Value Date   CRP 6.0 07/30/2018   ESRSEDRATE 17 07/30/2018         Note: Above Lab results reviewed.  Recent Imaging Review  MR ABDOMEN WO CONTRAST CLINICAL DATA:  Abdominal pain concern for bowel obstruction. RIGHT upper quadrant pain and constipation.  EXAM: MRI ABDOMEN WITHOUT CONTRAST  TECHNIQUE: Multiplanar multisequence MR imaging was performed without the administration of intravenous contrast.  COMPARISON:  None Available.  FINDINGS: Lower chest:  Lung bases are clear.  Hepatobiliary: No focal hepatic lesion. No biliary duct dilatation. Gallbladder normal. No gallstones. Common bile duct normal caliber.  There is loss of signal intensity within the liver parenchyma on out of phase opposed phase imaging (series 11) consistent hepatic steatosis.  Pancreas: Normal pancreatic parenchymal intensity. No ductal dilatation or inflammation.  Spleen: Normal spleen.  Adrenals/urinary tract: Adrenal glands and kidneys are normal.  Stomach/Bowel: Stomach and limited of the small bowel is unremarkable. There is a moderate volume of stool within the colon. No evidence of bowel obstruction. Small bowel normal caliber.  Vascular/Lymphatic: Abdominal aortic normal caliber. No retroperitoneal periportal lymphadenopathy.  Musculoskeletal: No aggressive osseous lesion  IMPRESSION: 1. No acute findings in the abdomen. 2. Normal gallbladder and common bile  duct. 3. Hepatic steatosis. 4. Moderate volume stool suggests constipation. No bowel obstruction.  Electronically Signed   By: Suzy Bouchard M.D.   On: 12/03/2022 16:55 Note: Reviewed        Physical Exam  General appearance: Well nourished, well developed, and well hydrated. In no apparent acute distress Mental status: Alert, oriented x 3 (person, place, & time)       Respiratory: No evidence of acute respiratory distress Eyes: PERLA Vitals: LMP 10/22/2021 (Approximate)  BMI: Estimated body mass index is 40.6 kg/m as calculated from the following:   Height as of 11/25/22: '5\' 3"'$  (1.6 m).   Weight as of 11/25/22: 229 lb 3.2 oz (104 kg). Ideal: Patient weight not recorded  Assessment   Diagnosis Status  No diagnosis found. Controlled Controlled Controlled   Updated Problems: No problems updated.  Plan of Care  Problem-specific:  No problem-specific Assessment & Plan notes found for this encounter.  Ms. Cristel Sarson has a current medication list which includes the following long-term medication(s): amitriptyline, atorvastatin, baclofen, diclofenac sodium, diclofenac sodium, dicyclomine, dicyclomine, hydrocodone-acetaminophen, hydrocodone-acetaminophen, hydrocodone-acetaminophen, medroxyprogesterone, metformin, metoprolol tartrate, needles & syringes, pregabalin, rizatriptan, and sertraline.  Pharmacotherapy (Medications Ordered): No orders of the defined types were placed in this encounter.  Orders:  No orders of the defined types were placed in this encounter.  Follow-up plan:   No follow-ups on file.      Interventional Therapies  Risk  Complexity Considerations:   Morbid obesity Allergy to contrast. Allergy to latex. Allergy to NSAIDs. History of vasovagal episodes with spinal manipulation.   Planned  Pending:   Diagnostic/therapeutic right L4-5 LESI #1    Under consideration:   Diagnostic/therapeutic right L3-4 TFESI #2  Diagnostic/therapeutic midline  L1-2 LESI #3  Not a candidate for a spinal cord stimulator secondary to T7-8 disc protrusion and central spinal stenosis with moderate cord flattening.  (03/29/2022 thoracic MRI)  Therapeutic left cervical ESI #4  Therapeutic right L4-5 LESI #1    Completed:   Diagnostic/therapeutic right L3 TFESI x1 (10/29/2022) (0/50/100/80)  Neurosurgical referral for decompressive  laminectomy and/or discectomy Therapeutic left C7-T1 cervical ESI x3 (02/13/2021) (did not keep F/U.  Does not remember.) Therapeutic bilateral lumbar facet MBB x1 (01/06/2017)  Therapeutic left SI joint block x2 (08/03/2019) (80/30/80/20)  Therapeutic right L1-2 LESI x1 (12/16/2017)  Therapeutic left L1-2 LESI x1 (04/07/2017)  Therapeutic bilateral L2 TFESI x2 (11/09/2020) (did not keep F/U.  Does not remember.) Therapeutic midline T8-9 thoracic ESI x2 (11/09/2020) (did not keep F/U.  Does not remember.) Therapeutic left L4-5 LESI x1 (09/07/2019) (50/50/50/50)  Therapeutic left trapezius muscle TPI/MNB x3 (03/19/2018) (100/80/90/>75)  Therapeutic right trapezius muscle trigger point injection x1 (11/18/2017)  Therapeutic left occipital nerve RFA x1 (12/25/2017)  Therapeutic left C2 + TON RFA x1 (12/25/2017)  Therapeutic left caudal ESI x1 (09/28/2019) (0/0/0/30)  Therapeutic right T8-9 thoracic ESI x1 (11/09/2020)  It would appear that the last therapeutic left C7-T1 cervical ESI #3 (02/13/2021) did not provide her with any significant relief of the pain.  However, her very first cervical epidural steroid injection was done on 09/11/2016 and again caused a flareup of her pain.  Follow-up assessment indicated 80/60/70/> 50.  The procedure was then repeated on 10/30/2016 and although the patient did not keep up with the follow-up on that procedure, it would seem that her pain got under control and did not need any further interventions in the cervical region until 12/25/2017 when she had a left occipital nerve (Left) C2/TON RFA.  Unfortunately, the  patient is rather noncompliant with follow-up evaluations after diagnostic and therapeutic procedures and therefore the information collected on those results is not available to contribute towards obtaining better results.   Therapeutic  Palliative (PRN) options:      Pharmacotherapy  Nonopioid transferred 11/08/2020: Lyrica, baclofen         Recent Visits Date Type Provider Dept  11/12/22 Office Visit Milinda Pointer, MD [facility]  10/29/22 Procedure visit Milinda Pointer, MD [facility]  09/18/22 Office Visit Milinda Pointer, MD [facility]  Showing recent visits within past 90 days and meeting all other requirements Future Appointments Date Type Provider Dept  12/11/22 Appointment Milinda Pointer, MD [facility]  Showing future appointments within next 90 days and meeting all other requirements  I discussed the assessment and treatment plan with the patient. The patient was provided an opportunity to ask questions and all were answered. The patient agreed with the plan and demonstrated an understanding of the instructions.  Patient advised to call back or seek an in-person evaluation if the symptoms or condition worsens.  Duration of encounter: *** minutes.  Total time on encounter, as per AMA guidelines included both the face-to-face and non-face-to-face time personally spent by the physician and/or other qualified health care professional(s) on the day of the encounter (includes time in activities that require the physician or other qualified health care professional and does not include time in activities normally performed by clinical staff). Physician's time may include the following activities when performed: Preparing to see the patient (e.g., pre-charting review of records, searching for previously ordered imaging, lab work, and nerve conduction tests) Review of prior analgesic pharmacotherapies. Reviewing  PMP Interpreting ordered tests (e.g., lab work, imaging, nerve conduction tests) Performing post-procedure evaluations, including interpretation of diagnostic procedures Obtaining and/or reviewing separately obtained history Performing a medically appropriate examination and/or evaluation Counseling and educating the patient/family/caregiver Ordering medications, tests, or procedures Referring and communicating with other health care professionals (when not separately reported) Documenting clinical information in the electronic or other health record  Independently interpreting results (not separately reported) and communicating results to the patient/ family/caregiver Care coordination (not separately reported)  Note by: Gaspar Cola, MD Date: 12/11/2022; Time: 10:23 AM

## 2022-12-09 NOTE — Progress Notes (Signed)
Patient ID: Dana Bradley, female    DOB: Oct 23, 1970, 52 y.o.   MRN: [id number]  PCP: Delsa Grana, PA-C  Chief Complaint  Patient presents with   Consult    Pt states under a lot of stress    Subjective:   Dana Bradley is a 52 y.o. female, presents to clinic with CC of the following:  HPI  Neck mass/swelling ongoing since she noticed mid feb, pain sx got better but mass swelling is still there Swallowing issues for some time - years - but didn't notice enlarged right neck until last year   Long hx of reflux and coughing sx, on omeprazole, she cannot really say for how long she has noted food getting stuck in throat and feeling uncomfortable.   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[place]) 04/04/2022   Neurogenic bladder 01/28/2022   Urinary and fecal incontinence 01/28/2022   New onset type 2 diabetes mellitus ([place]) 01/02/2022   OSA (obstructive sleep apnea) 12/14/2021   Excessive daytime sleepiness 12/14/2021   Lateral epicondylitis, left elbow 07/19/2021   Abnormal MRI, cervical spine (02/14/2021) 02/28/2021   Cervicalgia 02/13/2021   Abnormal MRI, lumbar spine (12/27/2021) 11/09/2020   Chronic midline thoracic back pain 11/09/2020   Abnormal MRI, thoracic spine (08/02/2019) 11/09/2020   Thoracic spinal stenosis (T7-8) 11/09/2020   Prolapse of thoracic disc with radiculopathy (T7-8) 11/09/2020   Spasm of muscle of lower back 11/09/2020   Vertigo, benign paroxysmal, unspecified laterality [id number]   Uncomplicated opioid dependence ([place]) 11/08/2020   Hyperalgesia 03/07/2020   Neurogenic urinary incontinence 03/01/2020   Other intervertebral disc degeneration, lumbar region 03/01/2020   Spinal stenosis of lumbosacral  region 03/01/2020   Colon cancer screening 02/06/2020   Lumbar radiculitis (Right) 09/21/2019   Chronic lower extremity pain (Bilateral) 09/06/2019   Intractable migraine with aura without status migrainosus 08/10/2019   Other specified dorsopathies, sacral and sacrococcygeal region 08/03/2019   Latex precautions, history of latex allergy 08/03/2019   History of allergy to radiographic contrast media 08/03/2019   DDD (degenerative disc disease), cervical 07/21/2019   Cervical facet syndrome (Bilateral) (L>R) 07/21/2019   DDD (degenerative disc disease), thoracic 07/21/2019   Osteoarthritis of hip (Left) 07/21/2019   Chronic groin pain (Bilateral) (L>R) 07/21/2019   Chronic hip pain (Bilateral) (L>R) 07/21/2019   Somatic dysfunction of sacroiliac joint (Bilateral) 07/21/2019   Migraine with aura and with status migrainosus, not intractable 04/06/2019   Cervico-occipital neuralgia (Left) 04/06/2019   Weakness of leg (Left) 04/05/2019   Difficulty walking 04/05/2019   Chronic migraine without aura, with intractable migraine, so stated, with status migrainosus 12/27/2018   Malar rash 09/04/2018   Trigger point of neck (Left) 03/19/2018   Occipital headache 12/25/2017   Chronic fatigue syndrome with fibromyalgia 12/12/2017   Trigger point of shoulder region (Left) 11/17/2017   Myofascial pain syndrome (Left) (trapezius muscle) 07/22/2017   Lumbar L1-2 disc protrusion (Right) 04/07/2017   Muscle spasticity 04/01/2017   Osteoarthritis of shoulder (Bilateral) 04/01/2017   Lumbar spondylosis 01/06/2017   Chronic hip pain (Left) 12/24/2016   Chronic sacroiliac joint pain (Left) 12/24/2016   Lumbar facet syndrome (Bilateral) (L>R) 12/24/2016   Lumbar radiculitis (Left) 12/24/2016   Hypertriglyceridemia 11/27/2016   History  of vasovagal episode 10/30/2016   Cervicogenic headache 09/09/2016   Medication monitoring encounter 08/29/2016   Controlled substance agreement signed 08/28/2016    Plantar fasciitis of left foot 08/28/2016   Vitamin B12 deficiency 08/28/2016   Hyperlipidemia 08/28/2016   Nephrolithiasis 08/12/2016   Chronic pain syndrome 08/07/2016   Long term prescription opiate use 08/07/2016   Opiate use 08/07/2016   Long term prescription benzodiazepine use 08/07/2016   Neurogenic pain 08/07/2016   Chronic low back pain (1ry area of Pain) (Bilateral) (R>L) (midline) w/ sciatica (Bilateral) 08/07/2016   Chronic upper back pain (2ry area of Pain) (Bilateral) (L>R) 08/07/2016   Chronic abdominal pain (Right lower quadrant) 08/07/2016   Thoracic radiculitis (Bilateral: T10, T11) 08/07/2016   Chronic occipital neuralgia (3ry area of Pain) (Bilateral) (L>R) 08/07/2016   Chronic neck pain 08/07/2016   Chronic cervical radicular pain (Bilateral) (L>R) 08/07/2016   Chronic shoulder blade pain (Bilateral) (L>R) 08/07/2016   Chronic upper extremity pain (Bilateral) (R>L) 08/07/2016   Chronic knee pain (Bilateral) (R>L) 08/07/2016   Chronic ankle pain (Bilateral) 08/07/2016   Cervical spondylosis with myelopathy and radiculopathy 08/07/2016   Panic disorder with agoraphobia 05/29/2016   Depression, unspecified depression type 05/29/2016   Atypical lymphocytosis 05/01/2016   Vitamin D insufficiency 05/01/2016   Chronic lower extremity cramps (Bilateral) (R>L) 04/29/2016   Obesity, Class III, BMI 40-49.9 (morbid obesity) ([place]) 04/29/2016   GAD (generalized anxiety disorder) 04/29/2016   Fatigue 04/29/2016   Insomnia 07/12/2015   Migraine without aura and with status migrainosus, not intractable 07/12/2015   Chronic superficial gastritis 06/02/2015   Chronic pain of multiple joints 05/15/2015   Bilateral leg edema 05/02/2015   Paroxysmal supraventricular tachycardia ([place]) 04/17/2015   Exertional shortness of breath 04/17/2015   Bright red rectal bleeding 04/06/2015   DDD (degenerative disc disease), lumbosacral 01/24/2014   DDD (degenerative disc disease), lumbar  01/24/2014   Cervico-occipital neuralgia 12/29/2013   Fibromyalgia 12/29/2013   Migraine headache 12/29/2013   Menorrhagia 12/10/2012   Depression, major, recurrent, in remission ([place]) 01/12/2009   Chest pain 01/12/2009   Hypertension, benign essential, goal below 140/90 06/23/2008   History of PSVT (paroxysmal supraventricular tachycardia) 06/17/2008   Obstructive sleep apnea 06/17/2008   GERD 06/13/2008      Current Outpatient Medications:    amitriptyline (ELAVIL) 100 MG tablet, Take 1 tablet (100 mg total) by mouth at bedtime., Disp: 90 tablet, Rfl: 3   atorvastatin (LIPITOR) 40 MG tablet, TAKE 1 TABLET (40 MG TOTAL) BY MOUTH AT BEDTIME., Disp: 90 tablet, Rfl: 3   baclofen (LIORESAL) 10 MG tablet, Take 0.5-1 tablets (5-10 mg total) by mouth 3 (three) times daily. Each refill must last 30 days., Disp: 270 tablet, Rfl: 1   butalbital-acetaminophen-caffeine (BAC) 50-325-40 MG tablet, Take 1 tablet by mouth every 4 (four) hours as needed for migraine., Disp: 60 tablet, Rfl: 3   cyanocobalamin (,VITAMIN B-12,) 1000 MCG/ML injection, INJECT 1 ML (1,000 MCG) INTO THE MUSCLE ONCE FOR 1 DOSE, Disp: 1 mL, Rfl: 3   diclofenac Sodium (VOLTAREN) 1 % GEL, Apply topically., Disp: , Rfl:    dicyclomine (BENTYL) 10 MG capsule, Take by mouth., Disp: , Rfl:    doxycycline (VIBRA-TABS) 100 MG tablet, Take by mouth., Disp: , Rfl:    Eptinezumab-jjmr (VYEPTI) 100 MG/ML injection, Inject 1 mL (100 mg total) into the vein every 3 (three) months., Disp: 1 mL, Rfl: 3   HYDROcodone-acetaminophen (NORCO) 7.5-325 MG tablet, Take 1 tablet by mouth 2 (two) times daily as  needed for severe pain. Must last 30 days, Disp: 25 tablet, Rfl: 0   lipase/protease/amylase (CREON) 12000-38000 units CPEP capsule, Take 2 capsules by mouth with the first bite of each meal and take 1 capsule before snacks. (Max of 8 capsules per day)., Disp: 270 capsule, Rfl: 1   LORazepam (ATIVAN) 1 MG tablet, Take by mouth., Disp: , Rfl:     meclizine (ANTIVERT) 12.5 MG tablet, Take 1 tablet (12.5 mg total) by mouth 3 (three) times daily as needed for dizziness., Disp: 30 tablet, Rfl: 0   medroxyPROGESTERone (PROVERA) 5 MG tablet, TAKE 1 TABLET BY MOUTH DAILY, Disp: 90 tablet, Rfl: 1   metFORMIN (GLUCOPHAGE) 1000 MG tablet, Take 1 tablet (1,000 mg total) by mouth 2 (two) times daily with a meal., Disp: 180 tablet, Rfl: 1   metoprolol tartrate (LOPRESSOR) 50 MG tablet, TAKE 1 AND 1/2 TABLET BY MOUTH 2 TIMES DAILY, Disp: 135 tablet, Rfl: 3   naloxone (NARCAN) nasal spray 4 mg/0.1 mL, Place 1 spray into the nose as needed for up to 365 doses (for opioid-induced respiratory depresssion). In case of emergency (overdose), spray once into each nostril. If no response within 3 minutes, repeat application and call [phone]., Disp: 1 each, Rfl: 0   Needles & Syringes MISC, For administration of B12 injections, Disp: 30 each, Rfl: 0   omeprazole (PRILOSEC) 40 MG capsule, Take 40 mg by mouth daily., Disp: , Rfl:    ondansetron (ZOFRAN) 4 MG tablet, Take 1 tablet (4 mg total) by mouth every 8 (eight) hours as needed., Disp: 20 tablet, Rfl: 2   polyethylene glycol-electrolytes (NULYTELY) 420 g solution, Prepare according to package instructions. Starting at 5:00 PM: Drink one 8 oz glass of mixture every 15 minutes until you finish half of the jug. Five hours prior to procedure, drink 8 oz glass of mixture every 15 minutes until it is all gone. Make sure you do not drink anything 4 hours prior to your procedure., Disp: 4000 mL, Rfl: 0   pregabalin (LYRICA) 150 MG capsule, TAKE 1 CAPSULE BY MOUTH EVERY 8 HOURS, Disp: 270 capsule, Rfl: 3   Rimegepant Sulfate 75 MG TBDP, Take 1 tablet by mouth every other day., Disp: , Rfl:    rizatriptan (MAXALT) 10 MG tablet, Take 1 tablet (10 mg total) by mouth as needed for migraine. May repeat in 2 hours if needed, Disp: 10 tablet, Rfl: 3   scopolamine (TRANSDERM-SCOP) 1 MG/3DAYS, Place 1 patch (1.5 mg total) onto the skin  every 3 (three) days., Disp: 10 patch, Rfl: 12   sertraline (ZOLOFT) 25 MG tablet, Take 1 tablet (25 mg total) by mouth at bedtime., Disp: 30 tablet, Rfl: 3   VALERIAN PO, Take by mouth as needed. Makes Valerian tea about 3-4 times per week., Disp: , Rfl:    Vitamin D, Ergocalciferol, (DRISDOL) 1.25 MG (50000 UNIT) CAPS capsule, Take 1 capsule (50,000 Units total) by mouth every 7 (seven) days., Disp: 7 capsule, Rfl: 0   diclofenac Sodium (VOLTAREN) 1 % GEL, Apply 2 g topically 4 (four) times daily as needed., Disp: 350 g, Rfl: PRN   dicyclomine (BENTYL) 10 MG capsule, Take 1 capsule (10 mg total) by mouth 4 (four) times daily as needed (before meals and at bedtime), Disp: 360 capsule, Rfl: 1   HYDROcodone-acetaminophen (NORCO) 7.5-325 MG tablet, Take 1 tablet by mouth 2 (two) times daily as needed for severe pain. Must last 30 days, Disp: 25 tablet, Rfl: 0   HYDROcodone-acetaminophen (NORCO) 7.5-325 MG  tablet, Take 1 tablet by mouth 2 (two) times daily as needed for severe pain. Must last 30 days, Disp: 25 tablet, Rfl: 0   Allergies  Allergen Reactions   Aspirin Swelling   Cephalexin Rash   Cymbalta [Duloxetine Hcl] Other (See Comments)    Suicidal ideations and has homicidal thoughts per patient   Depakote [Divalproex Sodium] Shortness Of Breath    W/ n/v   Gadolinium Derivatives     Pt was unable to breath Other reaction(s): Other (See Comments) Pt was unable to breath   Haloperidol Shortness Of Breath    W/ n/v   Meperidine Nausea And Vomiting    Patient projectile vomits and usually result in ER Other reaction(s): Vomiting   Metoclopramide Shortness Of Breath    Can't breath, wheezes Other reaction(s): Other (See Comments)   Morphine     Other reaction(s): Vomiting   Penicillins Rash    Other reaction(s): Unknown   Prochlorperazine Other (See Comments)    Panic attack Other reaction(s): Other (See Comments)   Tramadol Hcl Palpitations    Severely and adversely affects her  SVT giving her tachycardias of 150-160 bpm.   Trazodone Shortness Of Breath    Other reaction(s): Other (See Comments)   Meloxicam Other (See Comments)    mouth sores, tingling, blisters in mouth Other reaction(s): Other (See Comments) mout sores, tingling mouth sores, tingling, blisters in mouth   Neomycin-Bacitracin Zn-Polymyx Rash   Tomato Hives    Tongue will blister   Egg-Derived Products Other (See Comments)    inflammation   Milk-Related Compounds Other (See Comments)    inflammation   Other     Other reaction(s): Other (See Comments)   Shellfish Allergy     Other reaction(s): Unknown   Shellfish-Derived Products Other (See Comments)    Other reaction(s): Unknown Other reaction(s): Unknown   Bacitra-Neomycin-Polymyxin-Hc Rash   Bacitracin-Neomycin-Polymyxin Rash   Cephalosporins Rash    rash   Ibuprofen Other (See Comments) and Rash    Blisters in mouth. Blisters in mouth.   Latex Itching    Other reaction(s): Unknown   Nsaids Other (See Comments)    Blisters in mouth; can take 1 ibuprofen 2x a month Other reaction(s): Other (See Comments), Unknown Blisters in mouth; can take 1 ibuprofen 2x a month   Sulfa Antibiotics Rash    rash Other reaction(s): Unknown rash  Other reaction(s): Unknown rash rash Other reaction(s): Unknown rash   Sulfonamide Derivatives Rash     Social History   Tobacco Use   Smoking status: Former    Packs/day: 2.00    Years: 3.00    Total pack years: 6.00    Types: Cigarettes    Quit date: 12/10/1992    Years since quitting: 30.0   Smokeless tobacco: Never   Tobacco comments:    quit 25 years ago  Vaping Use   Vaping Use: Never used  Substance Use Topics   Alcohol use: No    Comment: socially   Drug use: No      Chart Review Today: I personally reviewed active problem list, medication list, allergies, family history, social history, health maintenance, notes from last encounter, lab results, imaging with the  patient/caregiver today.   Review of Systems  Constitutional: Negative.   HENT: Negative.    Eyes: Negative.   Respiratory: Negative.    Cardiovascular: Negative.   Gastrointestinal: Negative.   Endocrine: Negative.   Genitourinary: Negative.   Musculoskeletal: Negative.   Skin: Negative.  Allergic/Immunologic: Negative.   Neurological: Negative.   Hematological: Negative.   Psychiatric/Behavioral: Negative.    All other systems reviewed and are negative.      Objective:   Vitals:   12/09/22 1057 12/09/22 1117  BP: 126/72   Pulse: (!) 111 (!) 113  Resp: 16   Temp: 98.2 F (36.8 C)   TempSrc: Oral   SpO2: 96%   Weight: 227 lb (103 kg)   Height: '5\' 3"'$  (1.6 m)     Body mass index is 40.21 kg/m.  Physical Exam Vitals and nursing note reviewed.  Constitutional:      General: She is not in acute distress.    Appearance: Normal appearance. She is well-developed. She is obese. She is not ill-appearing, toxic-appearing or diaphoretic.  HENT:     Head: Normocephalic and atraumatic.     Nose: Nose normal.  Eyes:     General:        Right eye: No discharge.        Left eye: No discharge.     Conjunctiva/sclera: Conjunctivae normal.  Neck:     Thyroid: Thyromegaly present. No thyroid tenderness.     Trachea: Trachea and phonation normal. No tracheal deviation.  Cardiovascular:     Rate and Rhythm: Normal rate and regular rhythm.  Pulmonary:     Effort: Pulmonary effort is normal. No respiratory distress.     Breath sounds: No stridor.  Musculoskeletal:        General: Normal range of motion.  Skin:    General: Skin is warm and dry.     Findings: No rash.  Neurological:     Mental Status: She is alert.     Motor: No abnormal muscle tone.     Coordination: Coordination normal.  Psychiatric:        Behavior: Behavior normal.      Results for orders placed or performed in visit on 11/15/22  POCT rapid strep A  Result Value Ref Range   Rapid Strep A Screen  Positive (A) Negative   *Note: Due to a large number of results and/or encounters for the requested time period, some results have not been displayed. A complete set of results can be found in Results Review.       Assessment & Plan:     ICD-10-CM   1. Neck mass  R22.1 US THYROID   vs goiter/thyromegaly - pain improved after tx for strep, but the mass never went away, thyroid US    2. Thyromegaly  E01.0 US THYROID   Suspect its thyroid, [place] to assess, may want to get swallowing study or CT neck with contrast if [place] notes other abnormal tissue    3. Dysphagia, unspecified type  R13.10    for possibly for years, feels stuck in throat area, has been careful with food/chewing for years     Dysphagia - pending thyroid US - we may need to get a barium swallow study done or have ENT or GI consult  Patient's PHQ-9 was highly positive today she states she will ask her specialist about increasing her Zoloft medication, she has not been seeing a psychiatrist or therapist lately because she does not want to go to the local practice Multiple significant stressors including the gradual decline of her mother, family conflict now with her mother possibly passing away soon, her own health, her daughters cancer care.  She is on Zoloft and will ask for this medication to be increased she may want to  consider establishing with a therapist or adding Wellbutrin if able.   She will call her insurance and let us know who to refer her to for further eval and management. I offered to change zoloft dose but she declined and said she'll have her specialists PM&R do that.     Delsa Grana, PA-C 12/09/22 11:30 AM

## 2022-12-10 ENCOUNTER — Encounter: Payer: Medicaid Other | Attending: Psychology | Admitting: Physical Medicine and Rehabilitation

## 2022-12-10 ENCOUNTER — Other Ambulatory Visit: Payer: Self-pay | Admitting: Obstetrics and Gynecology

## 2022-12-10 ENCOUNTER — Other Ambulatory Visit: Payer: Self-pay | Admitting: Family Medicine

## 2022-12-10 ENCOUNTER — Other Ambulatory Visit (HOSPITAL_COMMUNITY): Payer: Self-pay

## 2022-12-10 DIAGNOSIS — G8929 Other chronic pain: Secondary | ICD-10-CM | POA: Insufficient documentation

## 2022-12-10 DIAGNOSIS — M5442 Lumbago with sciatica, left side: Secondary | ICD-10-CM | POA: Insufficient documentation

## 2022-12-10 DIAGNOSIS — G43001 Migraine without aura, not intractable, with status migrainosus: Secondary | ICD-10-CM | POA: Diagnosis not present

## 2022-12-10 DIAGNOSIS — G43711 Chronic migraine without aura, intractable, with status migrainosus: Secondary | ICD-10-CM | POA: Insufficient documentation

## 2022-12-10 DIAGNOSIS — M5441 Lumbago with sciatica, right side: Secondary | ICD-10-CM | POA: Insufficient documentation

## 2022-12-10 DIAGNOSIS — E782 Mixed hyperlipidemia: Secondary | ICD-10-CM

## 2022-12-10 DIAGNOSIS — Z9889 Other specified postprocedural states: Secondary | ICD-10-CM

## 2022-12-10 MED ORDER — SERTRALINE HCL 50 MG PO TABS: 50.0000 mg | ORAL_TABLET | Freq: Every day | ORAL | 3 refills | Status: DC

## 2022-12-10 MED ORDER — ZAVZPRET 10 MG/ACT NA SOLN: 1.0000 | Freq: Every day | NASAL | 3 refills | Status: DC | PRN

## 2022-12-10 NOTE — Progress Notes (Signed)
Subjective:    Patient ID: Dana Bradley, female    DOB: 03-03-1971, 52 y.o.   MRN: [id number]  HPI   An audio/video tele-health visit is felt to be the most appropriate encounter for this patient at this time. This is a follow up tele-visit via phone. The patient is at home. MD is at office. Prior to scheduling this appointment, our staff discussed the limitations of evaluation and management by telemedicine and the availability of in-person appointments. The patient expressed understanding and agreed to proceed.   1) Chronic Migraine:  -Dana Bradley a 52 year old woman who presents for f/u of her chronic migraine  -migraines continue to be severe and daily --she would like to try Zavzpret -she has ordered Bi[employer]em[employer]'s magnesium -she has been having stress due to her mother's Alzheimer's disease -she has been very fatigued during the day -she cannot be in light -on Monday she had to wear an eye mask in a darkened room. -Migraines will not get better unless she takes the Fioricet with the maxaalt but it puts her to sleepy -she feels she is getting a migraine every day -the Fioricet and Nurtec or Maxaalt together give her relief, but not complete relief.  -the days run into each other and its hard to remember the differences between them -she has been stressed in caring for her mother -she has noted recently that when she lays flat she feels fine but as soon as she sits up she feels an excruciating migraine -migraine continues to worsen from sitting to standing position -her daughter has similar symptoms but felt better after starting acetazolamide for presumed diagnosis of intracranial hypertension. She asks whether she may benefit from this medication as well.  Terrible stomach spasms and diarrhea. 12 hours later debilitating migraine. It usually starts in the afternoon. She feels that this may be part of her prodrome. She also has terrible vertigo that is getting worse. The Fiorcet helps a  lot, the Migronal helps with the frontal headache. These make the pain bearable.  -having migraines close to every 24H -she is not functional -she is not sure if Maxaalt is helping. She is not taking with the Nurtec. It is helping some but she is not sure how much. The fioricet alone helps by putting her to sleep.  -she has not heart any news about the Vypeti infusion. The medication was approved  -has been having more dizziness.  -the pain has been worst since she had the occipital nerve blocks.  -she knows it is stress related -she feels a pain radiating down her arm as well. It also runs into her head and into her face.  -Sometimes her whole left side is fizzy.  -she has tried Amitriptyline.  -have been awful since December. Had a migraine from Dec 1st throughout 9th.  -the day before she was incapacitated.  -Vyepti has been approved -The nurtec and the Fiorocet help together to break the migraine.  -she needs a refill of her Fioricet and nausea, she finds this helpful.  -Nurtec- she is still working with the company to get this improved -she asks about prodrome and postdrome (she experiences cramping and diarrhea).  -on Sunday she closed out a 56 hour migraines. -she has been trying to follow ketogenic diet.  -Nurtec helping- 5 days in a row without a migraines.  -she feels if she could take it as a rescue medication in addition to every other day this may help.   2) Vertigo -Symptoms  are worsening.  -She feels that this may be leading up to the vertigo.  -She is interested in vestibular therapy.  -she had a heating pad on the shoulder and arm.   3) Cervical myofasical pain syndrome: -Muscles of neck are very tight, pain is worse on the left side.  -She did not find spinal injections helpful, they made the pain worse.   4) Insomnia: she sleeps in chunks -she is willing to increase amitriptyline to '50mg'$ .   5) Cognitive impairments: -she loves to cook and noticed she has been  making more mistakes -she thinks the headaches are a problem.  6) Pain radiating from back into legs -feels twitches -worse at night -legs kick out at night.   7) IBS -terrible in the night -2am on Sunday. -horrible pain -often with diarrhea and improves symptoms after she has diarrhea.  -cramping.  -last normal BM is 1/9 -having abdominal distension -magnesium citrate does not help -miralax multiple times per day do not help  8) Stress  9) depression: -she has dry mouth will wellbutrin and did not find much efficacy. She asked if there is an alternative medication she can try -she had tried Zoloft in the past with benefits -she is going to pick up the Zoloft today and try it tonight -she would be willing to take vitamin D  Dana Bradley is a 52 year old woman who presents for follow-up of her chronic migraine. Her average pain is 6/10 and pain right now is 6/10. Her pain is intermittent, sharp, burning, stabbing, tingling, and aching.   She recently had 7 days without migraine. She is not sure what was associated with this. She had a pretty terrible bounceback.  She picked up the Migranal nasal spray. Our CMA explained how it works.   Her pain doctor is going to stop prescribing the Lyrica and she needs to find a new doctor to prescribe this.    Pain Inventory Average Pain 5 Pain Right Now 6 My pain is constant, sharp, burning, stabbing, tingling, and aching  In the last 24 hours, has pain interfered with the following? General activity 7 Relation with others 8 Enjoyment of life 8 What TIME of day is your pain at its worst? morning , daytime, evening, and night Sleep (in general) Poor  Pain is worse with: walking, bending, and some activites Pain improves with: medication Relief from Meds: 5     Family History  Problem Relation Age of Onset   Depression Mother    Hypertension Mother    Cancer Mother        Skin   Hyperlipidemia Mother    Anxiety disorder  Mother    Migraines Mother    Alcohol abuse Father    Depression Father    Stroke Father    Heart disease Father    Hypertension Father    Anxiety disorder Father    Depression Sister    Hyperlipidemia Sister    Diabetes Sister    Hypertension Sister    Polycystic ovary syndrome Sister    Bipolar disorder Sister    Anxiety disorder Sister    Migraines Sister    Depression Sister    Hypertension Sister    Anxiety disorder Sister    Migraines Sister    Breast cancer Maternal Grandmother 67   Cancer Maternal Grandmother 63       Breast   Thyroid disease Maternal Grandmother    Arthritis Maternal Grandmother    Hyperlipidemia Maternal Grandmother  Aneurysm Maternal Grandfather    Hypertension Maternal Grandfather    Heart disease Maternal Grandfather    Alzheimer's disease Paternal Grandmother    Heart attack Paternal Grandfather    Hypertension Paternal Grandfather    COPD Paternal Grandfather    Heart disease Paternal Grandfather    Migraines Daughter    Other Daughter        leomyoa scaroma   Migraines Daughter    Migraines Son    Alzheimer's disease Other    Bladder Cancer Neg Hx    Kidney cancer Neg Hx    Social History   Socioeconomic History   Marital status: Married    Spouse name: brian   Number of children: 3   Years of education: Not on file   Highest education level: Associate degree: academic program  Occupational History   Occupation: disbled    Comment: not able  Tobacco Use   Smoking status: Former    Packs/day: 2.00    Years: 3.00    Total pack years: 6.00    Types: Cigarettes    Quit date: 12/10/1992    Years since quitting: 30.0   Smokeless tobacco: Never   Tobacco comments:    quit 25 years ago  Vaping Use   Vaping Use: Never used  Substance and Sexual Activity   Alcohol use: No    Comment: socially   Drug use: No   Sexual activity: Not Currently    Partners: Male    Birth control/protection: Surgical  Other Topics Concern    Not on file  Social History Narrative   Lives at home with her husband and 2 of her children   Right handed   Caffeine: 0-2 cups daily   Social Determinants of Health   Financial Resource Strain: High Risk (08/29/2017)   Overall Financial Resource Strain (CARDIA)    Difficulty of Paying Living Expenses: Very hard  Food Insecurity: Food Insecurity Present (08/29/2017)   Hunger Vital Sign    Worried About Running Out of Food in the Last Year: Often true    Ran Out of Food in the Last Year: Often true  Transportation Needs: Unmet Transportation Needs (08/29/2017)   PRAPARE - Hydrologist (Medical): Yes    Lack of Transportation (Non-Medical): Yes  Physical Activity: Inactive (08/29/2017)   Exercise Vital Sign    Days of Exercise per Week: 0 days    Minutes of Exercise per Session: 0 min  Stress: Stress Concern Present (08/29/2017)   [place] of Stress : Rather much  Social Connections: Somewhat Isolated (08/29/2017)   Social Connection and Isolation Panel [NHANES]    Frequency of Communication with Friends and Family: Never    Frequency of Social Gatherings with Friends and Family: Never    Attends Religious Services: More than 4 times per year    Active Member of Clubs or Organizations: No    Attends Archivist Meetings: Never    Marital Status: Married   Past Surgical History:  Procedure Laterality Date   ABLATION     Uterine   BREAST BIOPSY Left    2014 U/S bx fibroadenoma   CARDIAC CATHETERIZATION     with ablation   COLONOSCOPY N/A 09/26/2022   Procedure: COLONOSCOPY;  Surgeon: Jonathon Bellows, MD;  Location: [facility] ENDOSCOPY;  Service: Gastroenterology;  Laterality: N/A;   COLONOSCOPY WITH PROPOFOL N/A 05/17/2015   Procedure: COLONOSCOPY WITH PROPOFOL;  Surgeon: Manya Silvas, MD;  Location: [facility] ENDOSCOPY;  Service: Endoscopy;  Laterality: N/A;    COLONOSCOPY WITH PROPOFOL N/A 03/20/2020   Procedure: COLONOSCOPY WITH PROPOFOL;  Surgeon: Jonathon Bellows, MD;  Location: [facility] ENDOSCOPY;  Service: Gastroenterology;  Laterality: N/A;   COLONOSCOPY WITH PROPOFOL N/A 09/25/2022   Procedure: COLONOSCOPY WITH PROPOFOL;  Surgeon: Jonathon Bellows, MD;  Location: [facility] ENDOSCOPY;  Service: Gastroenterology;  Laterality: N/A;   COLONOSCOPY WITH PROPOFOL N/A 10/02/2022   Procedure: COLONOSCOPY WITH PROPOFOL;  Surgeon: Jonathon Bellows, MD;  Location: [facility] ENDOSCOPY;  Service: Gastroenterology;  Laterality: N/A;   COLONOSCOPY WITH PROPOFOL N/A 11/13/2022   Procedure: COLONOSCOPY WITH PROPOFOL;  Surgeon: Jonathon Bellows, MD;  Location: [facility] ENDOSCOPY;  Service: Gastroenterology;  Laterality: N/A;   ESOPHAGOGASTRODUODENOSCOPY N/A 05/17/2015   Procedure: ESOPHAGOGASTRODUODENOSCOPY (EGD);  Surgeon: Manya Silvas, MD;  Location: [facility] ENDOSCOPY;  Service: Endoscopy;  Laterality: N/A;   ESOPHAGOGASTRODUODENOSCOPY N/A 09/25/2022   Procedure: ESOPHAGOGASTRODUODENOSCOPY (EGD);  Surgeon: Jonathon Bellows, MD;  Location: [facility] ENDOSCOPY;  Service: Gastroenterology;  Laterality: N/A;   ESOPHAGOGASTRODUODENOSCOPY N/A 09/26/2022   Procedure: ESOPHAGOGASTRODUODENOSCOPY (EGD);  Surgeon: Jonathon Bellows, MD;  Location: [facility] ENDOSCOPY;  Service: Gastroenterology;  Laterality: N/A;   KNEE ARTHROSCOPY     spg     6/18   Tibial Tubercle Bypass Right 1998   TUBAL LIGATION  10/01/1999   Past Surgical History:  Procedure Laterality Date   ABLATION     Uterine   BREAST BIOPSY Left    2014 U/S bx fibroadenoma   CARDIAC CATHETERIZATION     with ablation   COLONOSCOPY N/A 09/26/2022   Procedure: COLONOSCOPY;  Surgeon: Jonathon Bellows, MD;  Location: [facility] ENDOSCOPY;  Service: Gastroenterology;  Laterality: N/A;   COLONOSCOPY WITH PROPOFOL N/A 05/17/2015   Procedure: COLONOSCOPY WITH PROPOFOL;  Surgeon: Manya Silvas, MD;  Location: [facility] ENDOSCOPY;  Service: Endoscopy;  Laterality: N/A;   COLONOSCOPY  WITH PROPOFOL N/A 03/20/2020   Procedure: COLONOSCOPY WITH PROPOFOL;  Surgeon: Jonathon Bellows, MD;  Location: [facility] ENDOSCOPY;  Service: Gastroenterology;  Laterality: N/A;   COLONOSCOPY WITH PROPOFOL N/A 09/25/2022   Procedure: COLONOSCOPY WITH PROPOFOL;  Surgeon: Jonathon Bellows, MD;  Location: [facility] ENDOSCOPY;  Service: Gastroenterology;  Laterality: N/A;   COLONOSCOPY WITH PROPOFOL N/A 10/02/2022   Procedure: COLONOSCOPY WITH PROPOFOL;  Surgeon: Jonathon Bellows, MD;  Location: [facility] ENDOSCOPY;  Service: Gastroenterology;  Laterality: N/A;   COLONOSCOPY WITH PROPOFOL N/A 11/13/2022   Procedure: COLONOSCOPY WITH PROPOFOL;  Surgeon: Jonathon Bellows, MD;  Location: [facility] ENDOSCOPY;  Service: Gastroenterology;  Laterality: N/A;   ESOPHAGOGASTRODUODENOSCOPY N/A 05/17/2015   Procedure: ESOPHAGOGASTRODUODENOSCOPY (EGD);  Surgeon: Manya Silvas, MD;  Location: [facility] ENDOSCOPY;  Service: Endoscopy;  Laterality: N/A;   ESOPHAGOGASTRODUODENOSCOPY N/A 09/25/2022   Procedure: ESOPHAGOGASTRODUODENOSCOPY (EGD);  Surgeon: Jonathon Bellows, MD;  Location: [facility] ENDOSCOPY;  Service: Gastroenterology;  Laterality: N/A;   ESOPHAGOGASTRODUODENOSCOPY N/A 09/26/2022   Procedure: ESOPHAGOGASTRODUODENOSCOPY (EGD);  Surgeon: Jonathon Bellows, MD;  Location: [facility] ENDOSCOPY;  Service: Gastroenterology;  Laterality: N/A;   KNEE ARTHROSCOPY     spg     6/18   Tibial Tubercle Bypass Right 1998   TUBAL LIGATION  10/01/1999   Past Medical History:  Diagnosis Date   Acute postoperative pain 04/07/2017   Anxiety    Bursitis    Chronic fatigue 12/12/2017   Chronic fatigue syndrome    Colitis 2021   Diabetes mellitus without complication ([place])    Edema leg 05/02/2015   Fibromyalgia    GERD (gastroesophageal reflux disease)  IBS (irritable bowel syndrome)    Knee pain, bilateral 12/21/2008   Qualifier: Diagnosis of  By: Hassell Done FNP, [place]     Lumbar discitis    Migraines    Osteoarthritis    Right hand pain 04/10/2015   [facility]  has done nerve conduction studies and ruled out carpal tunnel.    Sleep apnea    Spinal stenosis    SVT (supraventricular tachycardia)    Vertigo    Vitamin D deficiency 05/01/2016   LMP 10/22/2021 (Approximate)   Opioid Risk Score:   Fall Risk Score:  `1  Depression screen [facility] 2/9     12/09/2022   10:57 AM 11/15/2022   11:17 AM 10/29/2022    9:01 AM 10/25/2022   11:05 AM 10/01/2022   11:21 AM 09/18/2022   10:43 AM 09/06/2022    8:59 AM  Depression screen PHQ 2/9  Decreased Interest 3 0 0 1 2 0 0  Down, Depressed, Hopeless 3 0 0 1 3 0 0  PHQ - 2 Score 6 0 0 2 5 0 0  Altered sleeping 3 0   2  0  Tired, decreased energy 3 0   1  0  Change in appetite 3 0   0  0  Feeling bad or failure about yourself  3 0   0  0  Trouble concentrating 3 0   0  0  Moving slowly or fidgety/restless 2 0   0  0  Suicidal thoughts 0 0   0  0  PHQ-9 Score 23 0   8  0  Difficult doing work/chores Extremely dIfficult Not difficult at all   Somewhat difficult  Not difficult at all   Review of Systems  Constitutional: Negative.   HENT: Negative.    Eyes: Negative.   Respiratory: Negative.    Cardiovascular: Negative.   Gastrointestinal: Negative.   Endocrine: Negative.   Genitourinary: Negative.   Musculoskeletal:  Positive for back pain.       Left side of body from head to toes  Skin: Negative.   Allergic/Immunologic: Negative.   Neurological:  Positive for dizziness and headaches.  Hematological: Negative.   Psychiatric/Behavioral: Negative.    All other systems reviewed and are negative.      Objective:   Physical Exam Gen: no distress, normal appearing HEENT: oral mucosa pink and moist, NCAT Cardio: Reg rate Chest: normal effort, normal rate of breathing Abd: distended Ext: no edema Psych: pleasant, normal affect Skin: intact Neuro: Alert and oriented x3     Assessment & Plan:  Dana Bradley is a 52 year old woman who presents for f/u with chronic intractable migraines s/p  numerous treatments, severe fibromyalgia, and IBS, and nausea, and vertigo.     1) Vertigo: In the future, restart for cervical myofascial pain syndrome: myofascial release, postural correction, stretching and strengthening of the muscles of the neck and upper back, development of HEP. Conitnue heating pads to muscles of upper back and neck. She is doing HEP.  -referred for vestibular therapy.  -discussed current symptoms.  -discussed response to meclizine -discussed worsening symptoms when in car   2) Migraines: Continue Fioricet, which is one of the medications that helps her. Refilled today. She takes this at least once every time she gets a migraine. Advised to use upon migraine onset and to not use more frequently than q6H during migraine. Refilled Zofran for nausea last week, and this has been helping her. Continue metoprolol which can be  helpful in migraine prophylaxis (HR is well controlled). Prescribed ergot nasal spray to try upon migraine initiation- advised no more frequently than 4 sprays per hour (still awaiting on prior auth). Discussed avoiding foods that may trigger migraines.  Failed vypeti, ajovy, emgality, continue Vypeti trial -trial of [employer]'s magnesium supplement -prescribed Zavzpret -continue re-entry phase of elimination diet- she has noted chili pepper and eggs to be triggers for her -failed feverfew supplement -discussed lumbar puncture Does get some benefit from [place] in combination -takes metoprolol -discussed increasing Fioricet or Amitriptyline.  -recommended drinking a glass of water every morning before standing up as her symptoms sound like orthostatic hypotension which could worsen migraines -recommended checking blood pressure daily in supine, sitting, and standing positions and this should help Bradley identify if symptoms are from orthostatic hypotension or intracranial hypertension -discussed trying daily water first before trying medication  acetazolamide.  -continue vitamin D  -Discussed that I can provide refill for her Lyrica when she needs it.  -topamax was not effective.  -When she gets the migraines her loss of words is getting worse.  -Ordered Vyepti- she will find out cost from her pharmacy. She will bring paperwork for this next visit. She is excited to try it.  -d/c nurtec since switched to maxalt -continue ear piercing. Pain feels more like pressure and less acidic.  -no benefits from Botox.  -continue to track migraines.  -Continue Baclofen for pain relief. Minimize use of Hydrocodone. She is only taking Lyrica once per day to make it last. Continue Migranal which is helping. Will occipital nerve block -Discussed that Dana Bradley's greatest source of happiness is her family. This community will be essential in helping her recover from her chronic pain and to increase her daily activity. Her daughter is also suffering from similar migraines unfortunately.  -Continue ginger, herbs, turmeric, blueberries, eating real food. Continue cutting down sugar. Using local honey is a great alternative. -Provided with a pain relief journal and discussed that it contains foods and lifestyle tips to naturally help to improve pain. Discussed that these lifestyle strategies are also very good for health unlike some medications which can have negative side effects. Discussed that the act of keeping a journal can be therapeutic and helpful to realize patterns what helps to trigger and alleviate pain.   -Continue Nurtec every other day- may use for breakthrough migraines as well.  -Discussed plan for Vypeti at [facility] infusion center.  Discussed that exercise is one of the most effective treatments for fibromyalgia. This will also help with her obesity. Made goal with Dana Bradley to walk outside her home at least once per day, and to garden at least once per day (her favorite activity). Can use elliptical which she has at home on rainy days.  The heat has been oppressive and so she has been trying to do the latter more.  Foods to alleviate migraine:  1) dark leafy greens 2) avocado 3) tuna 4) samon and mackerel 5) beans and legumes Supplements that can be helpful: feverfew, B12, and magnesium  Foods to avoid in migraine: 1) Excessive (or irregular timing) coffee 2) red wine 3) aged cheeses 4) chocolate 5) citrus fruits 6) aspartame and other artifical sweeteners 7) yeast 8) MSG (in processed foods) 9) processed and cured meats 10) nuts and certain seeds 11) chicken livers and other organ meats 12) dairy products like buttermilk, sour cream, and yogurt 13) dried fruits like dates, figs, and raisins 14) garlic 15) onions 16)  potato chips 17) pickled foods like olives and sauerkraut 18) some fresh fruits like ripe banana, papaya, red plums, raspberries, kiwi, pineapple 19) tomato-based products  Recommend to keep a migraine diary: rate daily the severity of your headache (1-10) and what foods you eat that day to help determine patterns.    3) Cervical facet arthrosis: Cervical XR normal- discussed results with patient. Pain is worse on left side.   4) Anxiety and depression -Her daughter was recently diagnosed with sarcoma and this is a great source of stress and fear to Dana Bradley. It has been a turbulent time for her and this has understandably worsened her symptoms. Discussed benefits of gratitude journaling and she plans to try this.  -discussed her life stressor, her mom's illness -discussed her difficult wean off Pristiq -prescribed zoloft -stop Wellbutrin -prescribed vitamin D -recommended Bi[employer]'s magnesium breakthrough -recommended methylated multivitamin -discussed following with Dr. Sima Matas.   5) Insomnia:  -continue amitirptyline to '50mg'$ .  -Try to go outside near sunrise -Get exercise during the day.  -Discussed good sleep hygiene: turning off all devices an hour before bedtime.   -Chamomile tea with dinner.  -Can consider over the counter melatonin -discussed magnesium  6) Nausea:  -continue zofran, discussed side effect of constipation -start B6 -discussed scopolamine patch, discussed to place behind ear and lave on 72 hours.    7) Cervical myofascial pain syndrome: Will try some trigger point injections with occipital nerve block next visit.  -increase amitriptyline to '100mg'$  HS  8) Concern for small bowel obstruction: -stat abdominal MRI ordered given no BM since 1/9.  -recommended to go to [facility] to get this done stat -discussed that it would be beneficial to go to the ED.   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 Letta Pate for ESI unchanged. Shallow broad-based disc protrusion L4-5 unchanged. -continue Norco -continue turmeric Turmeric to reduce inflammation--can be used in cooking or taken as a supplement.  Benefits of turmeric:  -Highly anti-inflammatory  -Increases antioxidants  -Improves memory, attention, brain disease  -Lowers risk of heart disease  -May help prevent cancer  -Decreases pain  -Alleviates depression  -Delays aging and decreases risk of chronic disease  -Consume with black pepper to increase absorption    Turmeric Milk Recipe:  1 cup milk  1 tsp turmeric  1 tsp cinnamon  1 tsp grated ginger (optional)  Black pepper (boosts the anti-inflammatory properties of turmeric).  1 tsp honey   13 minutes spent in discusison of her migrains, her abdominal MRI results, discussion of her constipation, discussion of Zavzpret for her migraines, continued plan for Vyepti infusion next week, that she has ordered Bi[employer]'s magnesium

## 2022-12-10 NOTE — Addendum Note (Signed)
Addended by: Izora Ribas on: 12/10/2022 08:02 PM   Modules accepted: Orders

## 2022-12-11 ENCOUNTER — Ambulatory Visit: Payer: Medicaid Other | Attending: Pain Medicine | Admitting: Pain Medicine

## 2022-12-11 ENCOUNTER — Encounter: Payer: Self-pay | Admitting: Pain Medicine

## 2022-12-11 ENCOUNTER — Institutional Professional Consult (permissible substitution): Payer: Medicaid Other | Admitting: Plastic Surgery

## 2022-12-11 DIAGNOSIS — G894 Chronic pain syndrome: Secondary | ICD-10-CM

## 2022-12-11 DIAGNOSIS — M5441 Lumbago with sciatica, right side: Secondary | ICD-10-CM

## 2022-12-11 DIAGNOSIS — M62838 Other muscle spasm: Secondary | ICD-10-CM | POA: Diagnosis not present

## 2022-12-11 DIAGNOSIS — M19011 Primary osteoarthritis, right shoulder: Secondary | ICD-10-CM

## 2022-12-11 DIAGNOSIS — M5442 Lumbago with sciatica, left side: Secondary | ICD-10-CM | POA: Diagnosis not present

## 2022-12-11 DIAGNOSIS — M19012 Primary osteoarthritis, left shoulder: Secondary | ICD-10-CM

## 2022-12-11 DIAGNOSIS — Z79891 Long term (current) use of opiate analgesic: Secondary | ICD-10-CM

## 2022-12-11 DIAGNOSIS — M5481 Occipital neuralgia: Secondary | ICD-10-CM

## 2022-12-11 DIAGNOSIS — G8929 Other chronic pain: Secondary | ICD-10-CM | POA: Diagnosis not present

## 2022-12-11 DIAGNOSIS — Z79899 Other long term (current) drug therapy: Secondary | ICD-10-CM | POA: Diagnosis not present

## 2022-12-11 DIAGNOSIS — M549 Dorsalgia, unspecified: Secondary | ICD-10-CM

## 2022-12-11 MED ORDER — HYDROCODONE-ACETAMINOPHEN 7.5-325 MG PO TABS: 1.0000 | ORAL_TABLET | Freq: Two times a day (BID) | ORAL | 0 refills | Status: DC | PRN

## 2022-12-11 NOTE — Patient Instructions (Signed)
____________________________________________________________________________________________  Opioid Pain Medication Update  To: All patients taking opioid pain medications. (I.e.: hydrocodone, hydromorphone, oxycodone, oxymorphone, morphine, codeine, methadone, tapentadol, tramadol, buprenorphine, fentanyl, etc.)  Re: Updated review of side effects and adverse reactions of opioid analgesics, as well as new information about long term effects of this class of medications.  Direct risks of long-term opioid therapy are not limited to opioid addiction and overdose. Potential medical risks include serious fractures, breathing problems during sleep, hyperalgesia, immunosuppression, chronic constipation, bowel obstruction, myocardial infarction, and tooth decay secondary to xerostomia.  Unpredictable adverse effects that can occur even if you take your medication correctly: Cognitive impairment, respiratory depression, and death. Most people think that if they take their medication "correctly", and "as instructed", that they will be safe. Nothing could be farther from the truth. In reality, a significant amount of recorded deaths associated with the use of opioids has occurred in individuals that had taken the medication for a long time, and were taking their medication correctly. The following are examples of how this can happen: Patient taking his/her medication for a long time, as instructed, without any side effects, is given a certain antibiotic or another unrelated medication, which in turn triggers a "Drug-to-drug interaction" leading to disorientation, cognitive impairment, impaired reflexes, respiratory depression or an untoward event leading to serious bodily harm or injury, including death.  Patient taking his/her medication for a long time, as instructed, without any side effects, develops an acute impairment of liver and/or kidney function. This will lead to a rapid inability of the body to  breakdown and eliminate their pain medication, which will result in effects similar to an "overdose", but with the same medicine and dose that they had always taken. This again may lead to disorientation, cognitive impairment, impaired reflexes, respiratory depression or an untoward event leading to serious bodily harm or injury, including death.  A similar problem will occur with patients as they grow older and their liver and kidney function begins to decrease as part of the aging process.  Background information: Historically, the original case for using long-term opioid therapy to treat chronic noncancer pain was based on safety assumptions that subsequent experience has called into question. In 1996, the American Pain Society and the [place] Academy of [employer] Medicine issued a consensus statement supporting long-term opioid therapy. This statement acknowledged the dangers of opioid prescribing but concluded that the risk for addiction was low; respiratory depression induced by opioids was short-lived, occurred mainly in opioid-naive patients, and was antagonized by pain; tolerance was not a common problem; and efforts to control diversion should not constrain opioid prescribing. This has now proven to be wrong. Experience regarding the risks for opioid addiction, misuse, and overdose in community practice has failed to support these assumptions.  According to the Centers for Disease Control and Prevention, fatal overdoses involving opioid analgesics have increased sharply over the past decade. Currently, more than 96,700 people die from drug overdoses every year. Opioids are a factor in 7 out of every 10 overdose deaths. Deaths from drug overdose have surpassed motor vehicle accidents as the leading cause of death for individuals between the ages of 62 and 36.  Clinical data suggest that neuroendocrine dysfunction may be very common in both men and women, potentially causing hypogonadism, erectile  dysfunction, infertility, decreased libido, osteoporosis, and depression. Recent studies linked higher opioid dose to increased opioid-related mortality. Controlled observational studies reported that long-term opioid therapy may be associated with increased risk for cardiovascular events. Subsequent meta-analysis concluded  that the safety of long-term opioid therapy in elderly patients has not been proven.   Side Effects and adverse reactions: Common side effects: Drowsiness (sedation). Dizziness. Nausea and vomiting. Constipation. Physical dependence -- Dependence often manifests with withdrawal symptoms when opioids are discontinued or decreased. Tolerance -- As you take repeated doses of opioids, you require increased medication to experience the same effect of pain relief. Respiratory depression -- This can occur in healthy people, especially with higher doses. However, people with COPD, asthma or other lung conditions may be even more susceptible to fatal respiratory impairment.  Uncommon side effects: An increased sensitivity to feeling pain and extreme response to pain (hyperalgesia). Chronic use of opioids can lead to this. Delayed gastric emptying (the process by which the contents of your stomach are moved into your small intestine). Muscle rigidity. Immune system and hormonal dysfunction. Quick, involuntary muscle jerks (myoclonus). Arrhythmia. Itchy skin (pruritus). Dry mouth (xerostomia).  Long-term side effects: Chronic constipation. Sleep-disordered breathing (SDB). Increased risk of bone fractures. Hypothalamic-pituitary-adrenal dysregulation. Increased risk of overdose.  RISKS: Fractures and Falls:  Opioids increase the risk and incidence of falls. This is of particular importance in elderly patients.  Endocrine System:  Long-term administration is associated with endocrine abnormalities (endocrinopathies). (Also known as Opioid-induced Endocrinopathy) Influences  on both the hypothalamic-pituitary-adrenal axis?and the hypothalamic-pituitary-gonadal axis have been demonstrated with consequent hypogonadism and adrenal insufficiency in both sexes. Hypogonadism and decreased levels of dehydroepiandrosterone sulfate have been reported in men and women. Endocrine effects include: Amenorrhoea in women (abnormal absence of menstruation) Reduced libido in both sexes Decreased sexual function Erectile dysfunction in men Hypogonadisms (decreased testicular function with shrinkage of testicles) Infertility Depression and fatigue Loss of muscle mass Anxiety Depression Immune suppression Hyperalgesia Weight gain Anemia Osteoporosis Patients (particularly women of childbearing age) should avoid opioids. There is insufficient evidence to recommend routine monitoring of asymptomatic patients taking opioids in the long-term for hormonal deficiencies.  Immune System: Human studies have demonstrated that opioids have an immunomodulating effect. These effects are mediated via opioid receptors both on immune effector cells and in the central nervous system. Opioids have been demonstrated to have adverse effects on antimicrobial response and anti-tumour surveillance. Buprenorphine has been demonstrated to have no impact on immune function.  Opioid Induced Hyperalgesia: Human studies have demonstrated that prolonged use of opioids can lead to a state of abnormal pain sensitivity, sometimes called opioid induced hyperalgesia (OIH). Opioid induced hyperalgesia is not usually seen in the absence of tolerance to opioid analgesia. Clinically, hyperalgesia may be diagnosed if the patient on long-term opioid therapy presents with increased pain. This might be qualitatively and anatomically distinct from pain related to disease progression or to breakthrough pain resulting from development of opioid tolerance. Pain associated with hyperalgesia tends to be more diffuse than the  pre-existing pain and less defined in quality. Management of opioid induced hyperalgesia requires opioid dose reduction.  Cancer: Chronic opioid therapy has been associated with an increased risk of cancer among noncancer patients with chronic pain. This association was more evident in chronic strong opioid users. Chronic opioid consumption causes significant pathological changes in the small intestine and colon. Epidemiological studies have found that there is a link between opium dependence and initiation of gastrointestinal cancers. Cancer is the second leading cause of death after cardiovascular disease. Chronic use of opioids can cause multiple conditions such as GERD, immunosuppression and renal damage as well as carcinogenic effects, which are associated with the incidence of cancers.   Mortality: Long-term opioid use  has been associated with increased mortality among patients with chronic non-cancer pain (CNCP).  Prescription of long-acting opioids for chronic noncancer pain was associated with a significantly increased risk of all-cause mortality, including deaths from causes other than overdose.  Reference: Von Korff M, Kolodny A, Deyo RA, Chou R. Long-term opioid therapy reconsidered. Ann Intern Med. 2011 Sep 6;155(5):325-8. doi: 10.7326/[id number]. PMID: [id number]; PMCIDLD:501236. Morley Kos, Hayward RA, Dunn KM, [place] KP. Risk of adverse events in patients prescribed long-term opioids: A cohort study in the [place] Clinical Practice Research Datalink. Eur J Pain. 2019 May;23(5):908-922. doi: 10.1002/ejp.1357. Epub 2019 Jan 31. PMID: [id number]. Colameco S, Coren JS, Ciervo CA. Continuous opioid treatment for chronic noncancer pain: a time for moderation in prescribing. Postgrad Med. 2009 Jul;121(4):61-6. doi: 10.3810/pgm.2009.07.2032. PMID: [id number]. Heywood Bene RN, [place] SD, Blazina I, Rosalio Loud, Bougatsos C, Deyo RA. The  effectiveness and risks of long-term opioid therapy for chronic pain: a systematic review for a [employer] of Health Pathways to [employer]. Ann Intern Med. 2015 Feb 17;162(4):276-86. doi: [id number]. PMID: [id number]. Marjory Sneddon [facility], [place] DM. NCHS Data Brief No. 22. [place]: Centers for Disease Control and Prevention; 2009. Sep, Increase in Fatal Poisonings Involving Opioid Analgesics in the [place], 1999-2006. Song IA, Choi HR, Oh TK. Long-term opioid use and mortality in patients with chronic non-cancer pain: Ten-year follow-up study in [place] from 2010 through 2019. EClinicalMedicine. 2022 Jul 18;51:101558. doi: 10.1016/j.eclinm.2022.YY:5197838. PMID: [id number]; PMCIDYT:2540545. Huser, W., Schubert, T., Vogelmann, T. et al. All-cause mortality in patients with long-term opioid therapy compared with non-opioid analgesics for chronic non-cancer pain: a database study. [place] Med 18, 162 (2020). [email or web address] Rashidian H, Roxy Cedar, Malekzadeh R, Haghdoost AA. An Ecological Study of the Association between Opiate Use and Incidence of Cancers. Addict Health. 2016 Fall;8(4):252-260. PMID: [id number]; PMCIDYE:622990.  Our Goal: Our goal is to control your pain with means other than the use of opioid pain medications.  Our Recommendation: Talk to your physician about coming off of these medications. We can assist you with the tapering down and stopping these medicines. Based on the new information, even if you cannot completely stop the medication, a decrease in the dose may be associated with a lesser risk. Ask for other means of controlling the pain. Decrease or eliminate those factors that significantly contribute to your pain such as smoking, obesity, and a diet heavily tilted towards "inflammatory" nutrients.  Last Updated: 11/27/2022    ____________________________________________________________________________________________     ____________________________________________________________________________________________  Patient Information update  To: All of our patients.  Re: Name change.  It has been made official that our current name, "[facility]"   will soon be changed to "[facility]".   The purpose of this change is to eliminate any confusion created by the concept of our practice being a "Medication Management Pain Clinic". In the past this has led to the misconception that we treat pain primarily by the use of prescription medications.  Nothing can be farther from the truth.   Understanding PAIN MANAGEMENT: To further understand what our practice does, you first have to understand that "Pain Management" is a subspecialty that requires additional training once a physician has completed their specialty training, which can be in either Anesthesia, Neurology, Psychiatry, or Physical Medicine and Rehabilitation (PMR). Each one of these contributes to the final approach taken by each physician to  the management of their patient's pain. To be a "Pain Management Specialist" you must have first completed one of the specialty trainings below.  Anesthesiologists - trained in clinical pharmacology and interventional techniques such as nerve blockade and regional as well as central neuroanatomy. They are trained to block pain before, during, and after surgical interventions.  Neurologists - trained in the diagnosis and pharmacological treatment of complex neurological conditions, such as Multiple Sclerosis, Parkinson's, spinal cord injuries, and other systemic conditions that may be associated with symptoms that may include but are not limited to pain. They tend to rely primarily on the treatment of chronic pain  using prescription medications.  Psychiatrist - trained in conditions affecting the psychosocial wellbeing of patients including but not limited to depression, anxiety, schizophrenia, personality disorders, addiction, and other substance use disorders that may be associated with chronic pain. They tend to rely primarily on the treatment of chronic pain using prescription medications.   Physical Medicine and Rehabilitation (PMR) physicians, also known as physiatrists - trained to treat a wide variety of medical conditions affecting the brain, spinal cord, nerves, bones, joints, ligaments, muscles, and tendons. Their training is primarily aimed at treating patients that have suffered injuries that have caused severe physical impairment. Their training is primarily aimed at the physical therapy and rehabilitation of those patients. They may also work alongside orthopedic surgeons or neurosurgeons using their expertise in assisting surgical patients to recover after their surgeries.  INTERVENTIONAL PAIN MANAGEMENT is sub-subspecialty of Pain Management.  Our physicians are Board-certified in Anesthesia, Pain Management, and Interventional Pain Management.  This meaning that not only have they been trained and Board-certified in their specialty of Anesthesia, and subspecialty of Pain Management, but they have also received further training in the sub-subspecialty of Interventional Pain Management, in order to become Board-certified as INTERVENTIONAL PAIN MANAGEMENT SPECIALIST.    Mission: Our goal is to use our skills in  [place] as alternatives to the chronic use of prescription opioid medications for the treatment of pain. To make this more clear, we have changed our name to reflect what we do and offer. We will continue to offer medication management assessment and recommendations, but we will not be taking over any patient's medication  management.  ____________________________________________________________________________________________     ____________________________________________________________________________________________  National Pain Medication Shortage  The [place] is experiencing worsening drug shortages. These have had a negative widespread effect on patient care and treatment. Not expected to improve any time soon. Predicted to last past 2029.   Drug shortage list (generic names) Oxycodone IR Oxycodone/APAP Oxymorphone IR Hydromorphone Hydrocodone/APAP Morphine  Where is the problem?  Manufacturing and supply level.  Will this shortage affect you?  Only if you take any of the above pain medications.  How? You may be unable to fill your prescription.  Your pharmacist may offer a "partial fill" of your prescription. (Warning: Do not accept partial fills.) Prescriptions partially filled cannot be transferred to another pharmacy. Read our Medication Rules and Regulation. Depending on how much medicine you are dependent on, you may experience withdrawals when unable to get the medication.  Recommendations: Consider ending your dependence on opioid pain medications. Ask your pain specialist to assist you with the process. Consider switching to a medication currently not in shortage, such as Buprenorphine. Talk to your pain specialist about this option. Consider decreasing your pain medication requirements by managing tolerance thru "Drug Holidays". This may help minimize withdrawals, should you run out of medicine. Control your pain thru  the use of non-pharmacological interventional therapies.   Your prescriber: Prescribers cannot be blamed for shortages. Medication manufacturing and supply issues cannot be fixed by the prescriber.   NOTE: The prescriber is not responsible for supplying the medication, or solving supply issues. Work with your pharmacist to solve it. The patient is responsible for  the decision to take or continue taking the medication and for identifying and securing a legal supply source. By law, supplying the medication is the job and responsibility of the pharmacy. The prescriber is responsible for the evaluation, monitoring, and prescribing of these medications.   Prescribers will NOT: Re-issue prescriptions that have been partially filled. Re-issue prescriptions already sent to a pharmacy.  Re-send prescriptions to a different pharmacy because yours did not have your medication. Ask pharmacist to order more medicine or transfer the prescription to another pharmacy. (Read below.)  New 2023 regulation: "May 31, 2022 Revised Regulation Allows DEA-Registered Pharmacies to Transfer Electronic Prescriptions at a Patient's Request [place] Patients now have the ability to request their electronic prescription be transferred to another pharmacy without having to go back to their practitioner to initiate the request. This revised regulation went into effect on Monday, May 27, 2022.     At a patient's request, a DEA-registered retail pharmacy can now transfer an electronic prescription for a controlled substance (schedules II-V) to another DEA-registered retail pharmacy. Prior to this change, patients would have to go through their practitioner to cancel their prescription and have it re-issued to a different pharmacy. The process was taxing and time consuming for both patients and practitioners.    The Drug Enforcement Administration [facility]) published its intent to revise the process for transferring electronic prescriptions on August 18, 2020.  The final rule was published in the federal register on April 25, 2022 and went into effect 30 days later.  Under the final rule, a prescription can only be transferred once between pharmacies, and only if allowed under existing state or other applicable law. The prescription must  remain in its electronic form; may not be altered in any way; and the transfer must be communicated directly between two licensed pharmacists. It's important to note, any authorized refills transfer with the original prescription, which means the entire prescription will be filled at the same pharmacy".  Reference: [email or web address] [facility] website announcement)  [email or web address] ([place])   [employer] / Vol. 88, No. 143 / Thursday, April 25, 2022 / Rules and Regulations DEPARTMENT OF JUSTICE  Drug Enforcement Administration  21 CFR Part 1306  [Docket No. [id number]]  RIN [id number] Transfer of Electronic Prescriptions for Schedules II-V Controlled Substances Between Pharmacies for Initial Filling  ____________________________________________________________________________________________     ____________________________________________________________________________________________  Transfer of Pain Medication between Pharmacies  Re: 2023 DEA Clarification on existing regulation  Published on DEA Website: May 31, 2022  Title: Revised Regulation Allows DEA-Registered Pharmacies to Conservator, museum/gallery Prescriptions at a Patient's Request [place]  "Patients now have the ability to request their electronic prescription be transferred to another pharmacy without having to go back to their practitioner to initiate the request. This revised regulation went into effect on Monday, May 27, 2022.     At a patient's request, a DEA-registered retail pharmacy can now transfer an electronic prescription for a controlled substance (schedules II-V) to another DEA-registered retail pharmacy. Prior to this change, patients would have to go through their practitioner to  cancel their prescription  and have it re-issued to a different pharmacy. The process was taxing and time consuming for both patients and practitioners.    The Drug Enforcement Administration [facility]) published its intent to revise the process for transferring electronic prescriptions on August 18, 2020.  The final rule was published in the federal register on April 25, 2022 and went into effect 30 days later.  Under the final rule, a prescription can only be transferred once between pharmacies, and only if allowed under existing state or other applicable law. The prescription must remain in its electronic form; may not be altered in any way; and the transfer must be communicated directly between two licensed pharmacists. It's important to note, any authorized refills transfer with the original prescription, which means the entire prescription will be filled at the same pharmacy."    REFERENCES: 1. DEA website announcement [email or web address]  2. Department of Justice website  [email or web address]  3. DEPARTMENT OF JUSTICE Drug Enforcement Administration 21 CFR Part 1306 [Docket No. [id number]] RIN [id number] "Transfer of Electronic Prescriptions for Schedules II-V Controlled Substances Between Pharmacies for Initial Filling"  ____________________________________________________________________________________________     _______________________________________________________________________  Medication Rules  Purpose: To inform patients, and their family members, of our medication rules and regulations.  Applies to: All patients receiving prescriptions from our practice (written or electronic).  Pharmacy of record: This is the pharmacy where your electronic prescriptions will be sent. Make sure we have the correct one.  Electronic prescriptions: In  compliance with the [place] (STOP) Act of 2017 (Session Lanny Cramp [phone]), effective September 30, 2018, all controlled substances must be electronically prescribed. Written prescriptions, faxing, or calling prescriptions to a pharmacy will no longer be done.  Prescription refills: These will be provided only during in-person appointments. No medications will be renewed without a "face-to-face" evaluation with your provider. Applies to all prescriptions.  NOTE: The following applies primarily to controlled substances (Opioid* Pain Medications).   Type of encounter (visit): For patients receiving controlled substances, face-to-face visits are required. (Not an option and not up to the patient.)  Patient's responsibilities: Pain Pills: Bring all pain pills to every appointment (except for procedure appointments). Pill Bottles: Bring pills in original pharmacy bottle. Bring bottle, even if empty. Always bring the bottle of the most recent fill.  Medication refills: You are responsible for knowing and keeping track of what medications you are taking and when is it that you will need a refill. The day before your appointment: write a list of all prescriptions that need to be refilled. The day of the appointment: give the list to the admitting nurse. Prescriptions will be written only during appointments. No prescriptions will be written on procedure days. If you forget a medication: it will not be "Called in", "Faxed", or "electronically sent". You will need to get another appointment to get these prescribed. No early refills. Do not call asking to have your prescription filled early. Partial  or short prescriptions: Occasionally your pharmacy may not have enough pills to fill your prescription.  NEVER ACCEPT a partial fill or a prescription that is short of the total amount of pills that you were prescribed.  With controlled substances the law allows 72 hours for  the pharmacy to complete the prescription.  If the prescription is not completed within 72 hours, the pharmacist will require a new prescription to be written. This means that you will be short on your medicine and we WILL NOT send another prescription to complete your original  prescription.  Instead, request the pharmacy to send a carrier to a nearby branch to get enough medication to provide you with your full prescription. Prescription Accuracy: You are responsible for carefully inspecting your prescriptions before leaving our office. Have the discharge nurse carefully go over each prescription with you, before taking them home. Make sure that your name is accurately spelled, that your address is correct. Check the name and dose of your medication to make sure it is accurate. Check the number of pills, and the written instructions to make sure they are clear and accurate. Make sure that you are given enough medication to last until your next medication refill appointment. Taking Medication: Take medication as prescribed. When it comes to controlled substances, taking less pills or less frequently than prescribed is permitted and encouraged. Never take more pills than instructed. Never take the medication more frequently than prescribed.  Inform other Doctors: Always inform, all of your healthcare providers, of all the medications you take. Pain Medication from other Providers: You are not allowed to accept any additional pain medication from any other Doctor or Healthcare provider. There are two exceptions to this rule. (see below) In the event that you require additional pain medication, you are responsible for notifying us, as stated below. Cough Medicine: Often these contain an opioid, such as codeine or hydrocodone. Never accept or take cough medicine containing these opioids if you are already taking an opioid* medication. The combination may cause respiratory failure and death. Medication Agreement:  You are responsible for carefully reading and following our Medication Agreement. This must be signed before receiving any prescriptions from our practice. Safely store a copy of your signed Agreement. Violations to the Agreement will result in no further prescriptions. (Additional copies of our Medication Agreement are available upon request.) Laws, Rules, & Regulations: All patients are expected to follow all Federal and [employer], [employer], Rules, [facility]. Ignorance of the Laws does not constitute a valid excuse.  Illegal drugs and Controlled Substances: The use of illegal substances (including, but not limited to marijuana and its derivatives) and/or the illegal use of any controlled substances is strictly prohibited. Violation of this rule may result in the immediate and permanent discontinuation of any and all prescriptions being written by our practice. The use of any illegal substances is prohibited. Adopted CDC guidelines & recommendations: Target dosing levels will be at or below 60 MME/day. Use of benzodiazepines** is not recommended.  Exceptions: There are only two exceptions to the rule of not receiving pain medications from other Healthcare Providers. Exception #1 (Emergencies): In the event of an emergency (i.e.: accident requiring emergency care), you are allowed to receive additional pain medication. However, you are responsible for: As soon as you are able, call our office [phone], at any time of the day or night, and leave a message stating your name, the date and nature of the emergency, and the name and dose of the medication prescribed. In the event that your call is answered by a member of our staff, make sure to document and save the date, time, and the name of the person that took your information.  Exception #2 (Planned Surgery): In the event that you are scheduled by another doctor or dentist to have any type of surgery or procedure, you are allowed (for a period no  longer than 30 days), to receive additional pain medication, for the acute post-op pain. However, in this case, you are responsible for picking up a copy of  our "Post-op Pain Management for Surgeons" handout, and giving it to your surgeon or dentist. This document is available at our office, and does not require an appointment to obtain it. Simply go to our office during business hours (Monday-Thursday from 8:00 AM to 4:00 PM) (Friday 8:00 AM to 12:00 Noon) or if you have a scheduled appointment with [place], prior to your surgery, and ask for it by name. In addition, you are responsible for: calling our office [phone], at any time of the day or night, and leaving a message stating your name, name of your surgeon, type of surgery, and date of procedure or surgery. Failure to comply with your responsibilities may result in termination of therapy involving the controlled substances. Medication Agreement Violation. Following the above rules, including your responsibilities will help you in avoiding a Medication Agreement Violation ("Breaking your Pain Medication Contract").  Consequences:  Not following the above rules may result in permanent discontinuation of medication prescription therapy.  *Opioid medications include: morphine, codeine, oxycodone, oxymorphone, hydrocodone, hydromorphone, meperidine, tramadol, tapentadol, buprenorphine, fentanyl, methadone. **Benzodiazepine medications include: diazepam (Valium), alprazolam (Xanax), clonazepam (Klonopine), lorazepam (Ativan), clorazepate (Tranxene), chlordiazepoxide (Librium), estazolam (Prosom), oxazepam (Serax), temazepam (Restoril), triazolam (Halcion) (Last updated: 07/23/2022) ______________________________________________________________________    ______________________________________________________________________  Medication Recommendations and Reminders  Applies to: All patients receiving prescriptions (written and/or  electronic).  Medication Rules & Regulations: You are responsible for reading, knowing, and following our "Medication Rules" document. These exist for your safety and that of others. They are not flexible and neither are we. Dismissing or ignoring them is an act of "non-compliance" that may result in complete and irreversible termination of such medication therapy. For safety reasons, "non-compliance" will not be tolerated. As with the [place] fundamental legal principle of "ignorance of the law is no defense", we will accept no excuses for not having read and knowing the content of documents provided to you by our practice.  Pharmacy of record:  Definition: This is the pharmacy where your electronic prescriptions will be sent.  We do not endorse any particular pharmacy. It is up to you and your insurance to decide what pharmacy to use.  We do not restrict you in your choice of pharmacy. However, once we write for your prescriptions, we will NOT be re-sending more prescriptions to fix restricted supply problems created by your pharmacy, or your insurance.  The pharmacy listed in the electronic medical record should be the one where you want electronic prescriptions to be sent. If you choose to change pharmacy, simply notify our nursing staff. Changes will be made only during your regular appointments and not over the phone.  Recommendations: Keep all of your pain medications in a safe place, under lock and key, even if you live alone. We will NOT replace lost, stolen, or damaged medication. We do not accept "Police Reports" as proof of medications having been stolen. After you fill your prescription, take 1 week's worth of pills and put them away in a safe place. You should keep a separate, properly labeled bottle for this purpose. The remainder should be kept in the original bottle. Use this as your primary supply, until it runs out. Once it's gone, then you know that you have 1 week's worth of medicine,  and it is time to come in for a prescription refill. If you do this correctly, it is unlikely that you will ever run out of medicine. To make sure that the above recommendation works, it is very important that you make  sure your medication refill appointments are scheduled at least 1 week before you run out of medicine. To do this in an effective manner, make sure that you do not leave the office without scheduling your next medication management appointment. Always ask the nursing staff to show you in your prescription , when your medication will be running out. Then arrange for the receptionist to get you a return appointment, at least 7 days before you run out of medicine. Do not wait until you have 1 or 2 pills left, to come in. This is very poor planning and does not take into consideration that we may need to cancel appointments due to bad weather, sickness, or emergencies affecting our staff. DO NOT ACCEPT A "Partial Fill": If for any reason your pharmacy does not have enough pills/tablets to completely fill or refill your prescription, do not allow for a "partial fill". The law allows the pharmacy to complete that prescription within 72 hours, without requiring a new prescription. If they do not fill the rest of your prescription within those 72 hours, you will need a separate prescription to fill the remaining amount, which we will NOT provide. If the reason for the partial fill is your insurance, you will need to talk to the pharmacist about payment alternatives for the remaining tablets, but again, DO NOT ACCEPT A PARTIAL FILL, unless you can trust your pharmacist to obtain the remainder of the pills within 72 hours.  Prescription refills and/or changes in medication(s):  Prescription refills, and/or changes in dose or medication, will be conducted only during scheduled medication management appointments. (Applies to both, written and electronic prescriptions.) No refills on procedure days. No  medication will be changed or started on procedure days. No changes, adjustments, and/or refills will be conducted on a procedure day. Doing so will interfere with the diagnostic portion of the procedure. No phone refills. No medications will be "called into the pharmacy". No Fax refills. No weekend refills. No Holliday refills. No after hours refills.  Remember:  Business hours are:  Monday to Thursday 8:00 AM to 4:00 PM Provider's Schedule: Milinda Pointer, MD - Appointments are:  Medication management: Monday and Wednesday 8:00 AM to 4:00 PM Procedure day: Tuesday and Thursday 7:30 AM to 4:00 PM Gillis Santa, MD - Appointments are:  Medication management: Tuesday and Thursday 8:00 AM to 4:00 PM Procedure day: Monday and Wednesday 7:30 AM to 4:00 PM (Last update: 07/23/2022) ______________________________________________________________________    ____________________________________________________________________________________________  Drug Holidays  What is a "Drug Holiday"? Drug Holiday: is the name given to the process of slowly tapering down and temporarily stopping the pain medication for the purpose of decreasing or eliminating tolerance to the drug.  Benefits Improved effectiveness Decreased required effective dose Improved pain control End dependence on high dose therapy Decrease cost of therapy Uncovering "opioid-induced hyperalgesia". (OIH)  What is "opioid hyperalgesia"? It is a paradoxical increase in pain caused by exposure to opioids. Stopping the opioid pain medication, contrary to the expected, it actually decreases or completely eliminates the pain. Ref.: "A comprehensive review of opioid-induced hyperalgesia". Brion Aliment, et.al. Pain Physician. 2011 Mar-Apr;14(2):145-61.  What is tolerance? Tolerance: the progressive loss of effectiveness of a pain medicine due to repetitive use. A common problem of opioid pain medications.  How long should a "Drug  Holiday" last? Effectiveness depends on the patient staying off all opioid pain medicines for a minimum of 14 consecutive days. (2 weeks)  How about just taking less of the medicine? Does not  work. Will not accomplish goal of eliminating the excess receptors.  How about switching to a different pain medicine? (AKA. "Opioid rotation") Does not work. Creates the illusion of effectiveness by taking advantage of inaccurate equivalent dose calculations between different opioids. -This "technique" was promoted by studies funded by [employer], such as [employer], creators of "OxyContin".  Can I stop the medicine "cold [place]"? Depends. You should always coordinate with your Pain Specialist to make the transition as smoothly as possible. Avoid stopping the medicine abruptly without consulting. We recommend a "slow taper".  What is a slow taper? Taper: refers to the gradual decrease in dose.   How do I stop/taper the dose? Slowly. Decrease the daily amount of pills that you take by one (1) pill every seven (7) days. This is called a "slow downward taper". Example: if you normally take four (4) pills per day, drop it to three (3) pills per day for seven (7) days, then to two (2) pills per day for seven (7) days, then to one (1) per day for seven (7) days, and then stop the medicine. The 14 day "Drug Holiday" starts on the first day without medicine.   Will I experience withdrawals? Unlikely with a slow taper.  What triggers withdrawals? Withdrawals are triggered by the sudden/abrupt stop of high dose opioids. Withdrawals can be avoided by slowly decreasing the dose over a prolonged period of time.  What are withdrawals? Symptoms associated with sudden/abrupt reduction/stopping of high-dose, long-term use of pain medication. Withdrawal are seldom seen on low dose therapy, or patients rarely taking opioid medication.  Early Withdrawal Symptoms may include: Agitation Anxiety Muscle  aches Increased tearing Insomnia Runny nose Sweating Yawning  Late symptoms may include: Abdominal cramping Diarrhea Dilated pupils Goose bumps Nausea Vomiting  (Last update: 09/08/2022) ____________________________________________________________________________________________    ____________________________________________________________________________________________  WARNING: CBD (cannabidiol) & Delta (Delta-8 tetrahydrocannabinol) products.   Applicable to:  All individuals currently taking or considering taking CBD (cannabidiol) and, more important, all patients taking opioid analgesic controlled substances (pain medication). (Example: oxycodone; oxymorphone; hydrocodone; hydromorphone; morphine; methadone; tramadol; tapentadol; fentanyl; buprenorphine; butorphanol; dextromethorphan; meperidine; codeine; etc.)  Introduction:  Recently there has been a drive towards the use of "natural" products for the treatment of different conditions, including pain anxiety and sleep disorders. Marijuana and hemp are two varieties of the cannabis genus plants. Marijuana and its derivatives are illegal, while hemp and its derivatives are not. Cannabidiol (CBD) and tetrahydrocannabinol (THC), are two natural compounds found in plants of the Cannabis genus. They can both be extracted from hemp or marijuana. Both compounds interact with your body's endocannabinoid system in very different ways. CBD is associated with pain relief (analgesia) while THC is associated with the psychoactive effects ("the high") obtained from the use of marijuana products. There are two main types of THC: Delta-9, which comes from the marijuana plant and it is illegal, and Delta-8, which comes from the hemp plant, and it is legal. (Both, Delta-9-THC and Delta-8-THC are psychoactive and give you "the high".)   Legality:  Marijuana and its derivatives: illegal Hemp and its derivatives: Legal (State dependent) UPDATE:  (11/16/2021) The Drug Enforcement Agency ([place]) issued a letter stating that "delta" cannabinoids, including Delta-8-THCO and Delta-9-THCO, synthetically derived from hemp do not qualify as hemp and will be viewed as Schedule I drugs. (Schedule I drugs, substances, or chemicals are defined as drugs with no currently accepted medical use and a high potential for abuse. Some examples of Schedule I drugs are: heroin,  lysergic acid diethylamide (LSD), marijuana (cannabis), 3,4-methylenedioxymethamphetamine (ecstasy), methaqualone, and peyote.) (https://jennings.com/)  Legal status of CBD in [place]:  "Conditionally Legal"  Reference: "FDA Regulation of Cannabis and Cannabis-Derived Products, Including Cannabidiol (CBD)" - SeekArt[employer].com.pt  Warning:  CBD is not FDA approved and has not undergo the same manufacturing controls as prescription drugs.  This means that the purity and safety of available CBD may be questionable. Most of the time, despite manufacturer's claims, it is contaminated with THC (delta-9-tetrahydrocannabinol - the chemical in marijuana responsible for the "HIGH").  When this is the case, the [facility] contaminant will trigger a positive urine drug screen (UDS) test for Marijuana (carboxy-THC).   The FDA recently put out a warning about 5 things that everyone should be aware of regarding Delta-8 THC: Delta-8 THC products have not been evaluated or approved by the FDA for safe use and may be marketed in ways that put the public health at risk. The FDA has received adverse event reports involving delta-8 THC-containing products. Delta-8 THC has psychoactive and intoxicating effects. Delta-8 THC manufacturing often involve use of potentially harmful chemicals to create the concentrations of delta-8 THC claimed in the marketplace. The final delta-8 THC product may have potentially harmful  by-products (contaminants) due to the chemicals used in the process. Manufacturing of delta-8 THC products may occur in uncontrolled or unsanitary settings, which may lead to the presence of unsafe contaminants or other potentially harmful substances. Delta-8 THC products should be kept out of the reach of children and pets.  NOTE: Because a positive UDS for any illicit substance is a violation of our medication agreement, your opioid analgesics (pain medicine) may be permanently discontinued.  MORE ABOUT CBD  General Information: CBD was discovered in 9 and it is a derivative of the cannabis sativa genus plants (Marijuana and Hemp). It is one of the 113 identified substances found in Marijuana. It accounts for up to 40% of the plant's extract. As of 2018, preliminary clinical studies on CBD included research for the treatment of anxiety, movement disorders, and pain. CBD is available and consumed in multiple forms, including inhalation of smoke or vapor, as an aerosol spray, and by mouth. It may be supplied as an oil containing CBD, capsules, dried cannabis, or as a liquid solution. CBD is thought not to be as psychoactive as THC (delta-9-tetrahydrocannabinol - the chemical in marijuana responsible for the "HIGH"). Studies suggest that CBD may interact with different biological target receptors in the body, including cannabinoid and other neurotransmitter receptors. As of 2018 the mechanism of action for its biological effects has not been determined.  Side-effects  Adverse reactions: Dry mouth, diarrhea, decreased appetite, fatigue, drowsiness, malaise, weakness, sleep disturbances, and others.  Drug interactions:  CBD may interact with medications such as blood-thinners. CBD causes drowsiness on its own and it will increase drowsiness caused by other medications, including antihistamines (such as Benadryl), benzodiazepines (Xanax, Ativan, Valium), antipsychotics, antidepressants, opioids, alcohol  and supplements such as kava, melatonin and St. John's Wort.  Other drug interactions: Brivaracetam (Briviact); Caffeine; Carbamazepine (Tegretol); Citalopram (Celexa); Clobazam (Onfi); Eslicarbazepine (Aptiom); Everolimus (Zostress); Lithium; Methadone (Dolophine); Rufinamide (Banzel); Sedative medications (CNS depressants); Sirolimus (Rapamune); Stiripentol (Diacomit); Tacrolimus (Prograf); Tamoxifen ; Soltamox); Topiramate (Topamax); Valproate; Warfarin (Coumadin); Zonisamide. (Last update: 09/09/2022) ____________________________________________________________________________________________   ____________________________________________________________________________________________  Naloxone Nasal Spray  Why am I receiving this medication? [place] STOP ACT requires that all patients taking high dose opioids or at risk of opioids respiratory depression, be prescribed an opioid reversal agent, such as  Naloxone (AKA: Narcan).  What is this medication? NALOXONE (nal OX one) treats opioid overdose, which causes slow or shallow breathing, severe drowsiness, or trouble staying awake. Call emergency services after using this medication. You may need additional treatment. Naloxone works by reversing the effects of opioids. It belongs to a group of medications called opioid blockers.  COMMON BRAND NAME(S): Kloxxado, Narcan  What should I tell my care team before I take this medication? They need to know if you have any of these conditions: Heart disease Substance use disorder An unusual or allergic reaction to naloxone, other medications, foods, dyes, or preservatives Pregnant or trying to get pregnant Breast-feeding  When to use this medication? This medication is to be used for the treatment of respiratory depression (less than 8 breaths per minute) secondary to opioid overdose.   How to use this medication? This medication is for use in the nose. Lay the person on their back.  Support their neck with your hand and allow the head to tilt back before giving the medication. The nasal spray should be given into 1 nostril. After giving the medication, move the person onto their side. Do not remove or test the nasal spray until ready to use. Get emergency medical help right away after giving the first dose of this medication, even if the person wakes up. You should be familiar with how to recognize the signs and symptoms of a narcotic overdose. If more doses are needed, give the additional dose in the other nostril. Talk to your care team about the use of this medication in children. While this medication may be prescribed for children as young as newborns for selected conditions, precautions do apply.  Naloxone Overdosage: If you think you have taken too much of this medicine contact a poison control center or emergency room at once.  NOTE: This medicine is only for you. Do not share this medicine with others.  What if I miss a dose? This does not apply.  What may interact with this medication? This is only used during an emergency. No interactions are expected during emergency use. This list may not describe all possible interactions. Give your health care provider a list of all the medicines, herbs, non-prescription drugs, or dietary supplements you use. Also tell them if you smoke, drink alcohol, or use illegal drugs. Some items may interact with your medicine.  What should I watch for while using this medication? Keep this medication ready for use in the case of an opioid overdose. Make sure that you have the phone number of your care team and local hospital ready. You may need to have additional doses of this medication. Each nasal spray contains a single dose. Some emergencies may require additional doses. After use, bring the treated person to the nearest hospital or call 911. Make sure the treating care team knows that the person has received a dose of this medication.  You will receive additional instructions on what to do during and after use of this medication before an emergency occurs.  What side effects may I notice from receiving this medication? Side effects that you should report to your care team as soon as possible: Allergic reactions--skin rash, itching, hives, swelling of the face, lips, tongue, or throat Side effects that usually do not require medical attention (report these to your care team if they continue or are bothersome): Constipation Dryness or irritation inside the nose Headache Increase in blood pressure Muscle spasms Stuffy nose Toothache This list may not  describe all possible side effects. Call your doctor for medical advice about side effects. You may report side effects to FDA at 1-800-FDA-1088.  Where should I keep my medication? Because this is an emergency medication, you should keep it with you at all times.  Keep out of the reach of children and pets. Store between 20 and 25 degrees C (68 and 77 degrees F). Do not freeze. Throw away any unused medication after the expiration date. Keep in original box until ready to use.  NOTE: This sheet is a summary. It may not cover all possible information. If you have questions about this medicine, talk to your doctor, pharmacist, or health care provider.   2023 Elsevier/Gold Standard (2021-05-25 00:00:00)  ____________________________________________________________________________________________

## 2022-12-11 NOTE — Progress Notes (Unsigned)
Nursing Pain Medication Assessment:  Safety precautions to be maintained throughout the outpatient stay will include: orient to surroundings, keep bed in low position, maintain call bell within reach at all times, provide assistance with transfer out of bed and ambulation.  Medication Inspection Compliance: Pill count conducted under aseptic conditions, in front of the patient. Neither the pills nor the bottle was removed from the patient's sight at any time. Once count was completed pills were immediately returned to the patient in their original bottle.  Medication: Hydrocodone/APAP Pill/Patch Count:  4 of 25 pills remain Pill/Patch Appearance: Markings consistent with prescribed medication Bottle Appearance: Standard pharmacy container. Clearly labeled. Filled Date: 2 / 62 / 2023 Last Medication intake:  Day before yesterdaySafety precautions to be maintained throughout the outpatient stay will include: orient to surroundings, keep bed in low position, maintain call bell within reach at all times, provide assistance with transfer out of bed and ambulation.

## 2022-12-18 ENCOUNTER — Ambulatory Visit (INDEPENDENT_AMBULATORY_CARE_PROVIDER_SITE_OTHER): Payer: Medicaid Other

## 2022-12-18 VITALS — BP 98/64 | HR 90 | Temp 97.9°F | Resp 16 | Ht 63.0 in | Wt 231.0 lb

## 2022-12-18 DIAGNOSIS — G43711 Chronic migraine without aura, intractable, with status migrainosus: Secondary | ICD-10-CM

## 2022-12-18 MED ORDER — SODIUM CHLORIDE 0.9 % IV SOLN
100.0000 mg | Freq: Once | INTRAVENOUS | Status: AC
  Administered 2022-12-18: 100 mg via INTRAVENOUS
  Filled 2022-12-18: qty 1

## 2022-12-18 NOTE — Progress Notes (Signed)
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 BP was 98/64. Declined to stay for observation, declined for [place] to contact Dr. Roney Jaffe she will go home and put on "scopolamine patch". Has husband here to drive her home. Advised if symptoms persist/worsen, and sob, chest pain, or fainting to call 911 or go to ER. Advised to keep tract of bp readings and inform primary Dr and or cardiology if bp continues to be low. Patient verbalized understanding.   Post Infusion IV Care: Peripheral IV Discontinued .refused observation   Discharge: Condition: Good, Destination: Home . AVS Declined  Performed by:  Adelina Mings, LPN

## 2022-12-20 ENCOUNTER — Ambulatory Visit
Admission: RE | Admit: 2022-12-20 | Discharge: 2022-12-20 | Disposition: A | Discharge status: Home / Self Care | Payer: Medicaid Other | Source: Ambulatory Visit | Attending: Family Medicine | Admitting: Family Medicine

## 2022-12-20 DIAGNOSIS — R221 Localized swelling, mass and lump, neck: Secondary | ICD-10-CM | POA: Diagnosis not present

## 2022-12-20 DIAGNOSIS — E010 Iodine-deficiency related diffuse (endemic) goiter: Secondary | ICD-10-CM | POA: Diagnosis not present

## 2022-12-20 DIAGNOSIS — E042 Nontoxic multinodular goiter: Secondary | ICD-10-CM | POA: Diagnosis not present

## 2022-12-23 ENCOUNTER — Encounter: Payer: Self-pay | Admitting: Gastroenterology

## 2022-12-23 ENCOUNTER — Encounter: Payer: Self-pay | Admitting: Family Medicine

## 2022-12-23 MED ORDER — LINACLOTIDE 290 MCG PO CAPS: 290.0000 ug | ORAL_CAPSULE | Freq: Every day | ORAL | 2 refills | Status: DC

## 2022-12-24 ENCOUNTER — Other Ambulatory Visit: Payer: Self-pay | Admitting: Family Medicine

## 2022-12-24 ENCOUNTER — Encounter: Payer: Self-pay | Admitting: Physical Medicine and Rehabilitation

## 2022-12-24 ENCOUNTER — Encounter: Payer: Medicaid Other | Admitting: Physical Medicine and Rehabilitation

## 2022-12-24 ENCOUNTER — Telehealth: Payer: Self-pay

## 2022-12-24 VITALS — BP 124/86 | HR 95 | Ht 63.0 in | Wt 229.0 lb

## 2022-12-24 DIAGNOSIS — G43711 Chronic migraine without aura, intractable, with status migrainosus: Secondary | ICD-10-CM | POA: Diagnosis not present

## 2022-12-24 DIAGNOSIS — M5442 Lumbago with sciatica, left side: Secondary | ICD-10-CM | POA: Diagnosis not present

## 2022-12-24 DIAGNOSIS — G8929 Other chronic pain: Secondary | ICD-10-CM

## 2022-12-24 DIAGNOSIS — M5441 Lumbago with sciatica, right side: Secondary | ICD-10-CM

## 2022-12-24 DIAGNOSIS — G43001 Migraine without aura, not intractable, with status migrainosus: Secondary | ICD-10-CM | POA: Diagnosis not present

## 2022-12-24 MED ORDER — QULIPTA 10 MG PO TABS: 1.0000 | ORAL_TABLET | Freq: Every day | ORAL | 3 refills | Status: DC | PRN

## 2022-12-24 MED ORDER — VYEPTI 100 MG/ML IV SOLN: 300.0000 mg | INTRAVENOUS | 3 refills | Status: DC

## 2022-12-24 NOTE — Addendum Note (Signed)
Addended by: Izora Ribas on: 12/24/2022 02:25 PM   Modules accepted: Orders

## 2022-12-24 NOTE — Progress Notes (Addendum)
Subjective:    Patient ID: Dana Bradley, female    DOB: 03-Aug-1971, 52 y.o.   MRN: [id number]  HPI    1) Chronic Migraine:  -Dana Bradley a 52 year old woman who presents for f/u of her chronic migraine  -migraines have been decent over the last few weeks -she thinks Vyepti helps -she is trying not to be stressed -[name] went to the cardiologist today and they are doing a cardiac MRI, and her fatigue is worse, she was told her heart is not pumping well. --she would like to try Zavzpret -she has ordered Bioptem[employer]'s magnesium -she has been having stress due to her mother's Alzheimer's disease -she has been very fatigued during the day -she cannot be in light -on Monday she had to wear an eye mask in a darkened room. -Migraines will not get better unless she takes the Fioricet with the maxaalt but it puts her to sleepy -she feels she is getting a migraine every day -the Fioricet and Nurtec or Maxaalt together give her relief, but not complete relief.  -the days run into each other and its hard to remember the differences between them -she has been stressed in caring for her mother -she has noted recently that when she lays flat she feels fine but as soon as she sits up she feels an excruciating migraine -migraine continues to worsen from sitting to standing position -her daughter has similar symptoms but felt better after starting acetazolamide for presumed diagnosis of intracranial hypertension. She asks whether she may benefit from this medication as well.  Terrible stomach spasms and diarrhea. 12 hours later debilitating migraine. It usually starts in the afternoon. She feels that this may be part of her prodrome. She also has terrible vertigo that is getting worse. The Fiorcet helps a lot, the Migronal helps with the frontal headache. These make the pain bearable.  -having migraines close to every 24H -she is not functional -she is not sure if Maxaalt is helping. She is not taking  with the Nurtec. It is helping some but she is not sure how much. The fioricet alone helps by putting her to sleep.  -she has not heart any news about the Vypeti infusion. The medication was approved  -has been having more dizziness.  -the pain has been worst since she had the occipital nerve blocks.  -she knows it is stress related -she feels a pain radiating down her arm as well. It also runs into her head and into her face.  -Sometimes her whole left side is fizzy.  -she has tried Amitriptyline.  -have been awful since December. Had a migraine from Dec 1st throughout 9th.  -the day before she was incapacitated.  -Vyepti has been approved -The nurtec and the Fiorocet help together to break the migraine.  -she needs a refill of her Fioricet and nausea, she finds this helpful.  -Nurtec- she is still working with the company to get this improved -she asks about prodrome and postdrome (she experiences cramping and diarrhea).  -on Sunday she closed out a 56 hour migraines. -she has been trying to follow ketogenic diet.  -Nurtec helping- 5 days in a row without a migraines.  -she feels if she could take it as a rescue medication in addition to every other day this may help.   2) Vertigo -Symptoms are worsening.  -She feels that this may be leading up to the vertigo.  -She is interested in vestibular therapy.  -she had a heating  pad on the shoulder and arm.   3) Cervical myofasical pain syndrome: -Muscles of neck are very tight, pain is worse on the left side.  -She did not find spinal injections helpful, they made the pain worse.   4) Insomnia: she sleeps in chunks -she is willing to increase amitriptyline to 50mg .   5) Cognitive impairments: -she loves to cook and noticed she has been making more mistakes -she thinks the headaches are a problem.  6) Pain radiating from back into legs -feels twitches -worse at night -legs kick out at night.   7) IBS -terrible in the  night -2am on Sunday. -horrible pain -often with diarrhea and improves symptoms after she has diarrhea.  -cramping.  -last normal BM is 1/9 -having abdominal distension -magnesium citrate does not help -miralax multiple times per day do not help  8) Stress  9) depression: -she has dry mouth will wellbutrin and did not find much efficacy. She asked if there is an alternative medication she can try -she had tried Zoloft in the past with benefits -she is going to pick up the Zoloft today and try it tonight -she would be willing to take vitamin D  10) Low back pain: -radiates into her feet -not a good surgical candidate -she feels she will be wheelchair bound soon  Dana Bradley is a 52 year old woman who presents for follow-up of her chronic migraine. Her average pain is 6/10 and pain right now is 6/10. Her pain is intermittent, sharp, burning, stabbing, tingling, and aching.   She recently had 7 days without migraine. She is not sure what was associated with this. She had a pretty terrible bounceback.  She picked up the Migranal nasal spray. Our CMA explained how it works.   Her pain doctor is going to stop prescribing the Lyrica and she needs to find a new doctor to prescribe this.    Pain Inventory Average Pain 6 Pain Right Now 5 My pain is intermittent, sharp, burning, stabbing, tingling, and aching  In the last 24 hours, has pain interfered with the following? General activity 8 Relation with others 8 Enjoyment of life 8 What TIME of day is your pain at its worst? morning , daytime, evening, and night Sleep (in general) Poor  Pain is worse with: bending and some activites Pain improves with: medication, TENS, and HEAT Relief from Meds: LITTLE    Family History  Problem Relation Age of Onset   Depression Mother    Hypertension Mother    Cancer Mother        Skin   Hyperlipidemia Mother    Anxiety disorder Mother    Migraines Mother    Alcohol abuse Father     Depression Father    Stroke Father    Heart disease Father    Hypertension Father    Anxiety disorder Father    Depression Sister    Hyperlipidemia Sister    Diabetes Sister    Hypertension Sister    Polycystic ovary syndrome Sister    Bipolar disorder Sister    Anxiety disorder Sister    Migraines Sister    Depression Sister    Hypertension Sister    Anxiety disorder Sister    Migraines Sister    Breast cancer Maternal Grandmother 8   Cancer Maternal Grandmother 63       Breast   Thyroid disease Maternal Grandmother    Arthritis Maternal Grandmother    Hyperlipidemia Maternal Grandmother    Aneurysm  Maternal Grandfather    Hypertension Maternal Grandfather    Heart disease Maternal Grandfather    Alzheimer's disease Paternal Grandmother    Heart attack Paternal Grandfather    Hypertension Paternal Grandfather    COPD Paternal Grandfather    Heart disease Paternal Grandfather    Migraines Daughter    Other Daughter        leomyoa scaroma   Migraines Daughter    Migraines Son    Alzheimer's disease Other    Bladder Cancer Neg Hx    Kidney cancer Neg Hx    Social History   Socioeconomic History   Marital status: Married    Spouse name: brian   Number of children: 3   Years of education: Not on file   Highest education level: Associate degree: academic program  Occupational History   Occupation: disbled    Comment: not able  Tobacco Use   Smoking status: Former    Packs/day: 2.00    Years: 3.00    Additional pack years: 0.00    Total pack years: 6.00    Types: Cigarettes    Quit date: 12/10/1992    Years since quitting: 30.0   Smokeless tobacco: Never   Tobacco comments:    quit 25 years ago  Vaping Use   Vaping Use: Never used  Substance and Sexual Activity   Alcohol use: No    Comment: socially   Drug use: No   Sexual activity: Not Currently    Partners: Male    Birth control/protection: Surgical  Other Topics Concern   Not on file  Social  History Narrative   Lives at home with her husband and 2 of her children   Right handed   Caffeine: 0-2 cups daily   Social Determinants of Health   Financial Resource Strain: High Risk (08/29/2017)   Overall Financial Resource Strain (CARDIA)    Difficulty of Paying Living Expenses: Very hard  Food Insecurity: Food Insecurity Present (08/29/2017)   Hunger Vital Sign    Worried About Running Out of Food in the Last Year: Often true    Ran Out of Food in the Last Year: Often true  Transportation Needs: Unmet Transportation Needs (08/29/2017)   PRAPARE - Hydrologist (Medical): Yes    Lack of Transportation (Non-Medical): Yes  Physical Activity: Inactive (08/29/2017)   Exercise Vital Sign    Days of Exercise per Week: 0 days    Minutes of Exercise per Session: 0 min  Stress: Stress Concern Present (08/29/2017)   [place] of Stress : Rather much  Social Connections: Somewhat Isolated (08/29/2017)   Social Connection and Isolation Panel [NHANES]    Frequency of Communication with Friends and Family: Never    Frequency of Social Gatherings with Friends and Family: Never    Attends Religious Services: More than 4 times per year    Active Member of Clubs or Organizations: No    Attends Archivist Meetings: Never    Marital Status: Married   Past Surgical History:  Procedure Laterality Date   ABLATION     Uterine   BREAST BIOPSY Left    2014 U/S bx fibroadenoma   CARDIAC CATHETERIZATION     with ablation   COLONOSCOPY N/A 09/26/2022   Procedure: COLONOSCOPY;  Surgeon: Jonathon Bellows, MD;  Location: [facility] ENDOSCOPY;  Service: Gastroenterology;  Laterality: N/A;   COLONOSCOPY WITH PROPOFOL N/A 05/17/2015  Procedure: COLONOSCOPY WITH PROPOFOL;  Surgeon: Manya Silvas, MD;  Location: [facility] ENDOSCOPY;  Service: Endoscopy;  Laterality: N/A;   COLONOSCOPY WITH PROPOFOL  N/A 03/20/2020   Procedure: COLONOSCOPY WITH PROPOFOL;  Surgeon: Jonathon Bellows, MD;  Location: [facility] ENDOSCOPY;  Service: Gastroenterology;  Laterality: N/A;   COLONOSCOPY WITH PROPOFOL N/A 09/25/2022   Procedure: COLONOSCOPY WITH PROPOFOL;  Surgeon: Jonathon Bellows, MD;  Location: [facility] ENDOSCOPY;  Service: Gastroenterology;  Laterality: N/A;   COLONOSCOPY WITH PROPOFOL N/A 10/02/2022   Procedure: COLONOSCOPY WITH PROPOFOL;  Surgeon: Jonathon Bellows, MD;  Location: [facility] ENDOSCOPY;  Service: Gastroenterology;  Laterality: N/A;   COLONOSCOPY WITH PROPOFOL N/A 11/13/2022   Procedure: COLONOSCOPY WITH PROPOFOL;  Surgeon: Jonathon Bellows, MD;  Location: [facility] ENDOSCOPY;  Service: Gastroenterology;  Laterality: N/A;   ESOPHAGOGASTRODUODENOSCOPY N/A 05/17/2015   Procedure: ESOPHAGOGASTRODUODENOSCOPY (EGD);  Surgeon: Manya Silvas, MD;  Location: [facility] ENDOSCOPY;  Service: Endoscopy;  Laterality: N/A;   ESOPHAGOGASTRODUODENOSCOPY N/A 09/25/2022   Procedure: ESOPHAGOGASTRODUODENOSCOPY (EGD);  Surgeon: Jonathon Bellows, MD;  Location: [facility] ENDOSCOPY;  Service: Gastroenterology;  Laterality: N/A;   ESOPHAGOGASTRODUODENOSCOPY N/A 09/26/2022   Procedure: ESOPHAGOGASTRODUODENOSCOPY (EGD);  Surgeon: Jonathon Bellows, MD;  Location: [facility] ENDOSCOPY;  Service: Gastroenterology;  Laterality: N/A;   KNEE ARTHROSCOPY     spg     6/18   Tibial Tubercle Bypass Right 1998   TUBAL LIGATION  10/01/1999   Past Surgical History:  Procedure Laterality Date   ABLATION     Uterine   BREAST BIOPSY Left    2014 U/S bx fibroadenoma   CARDIAC CATHETERIZATION     with ablation   COLONOSCOPY N/A 09/26/2022   Procedure: COLONOSCOPY;  Surgeon: Jonathon Bellows, MD;  Location: [facility] ENDOSCOPY;  Service: Gastroenterology;  Laterality: N/A;   COLONOSCOPY WITH PROPOFOL N/A 05/17/2015   Procedure: COLONOSCOPY WITH PROPOFOL;  Surgeon: Manya Silvas, MD;  Location: [facility] ENDOSCOPY;  Service: Endoscopy;  Laterality: N/A;   COLONOSCOPY WITH PROPOFOL N/A  03/20/2020   Procedure: COLONOSCOPY WITH PROPOFOL;  Surgeon: Jonathon Bellows, MD;  Location: [facility] ENDOSCOPY;  Service: Gastroenterology;  Laterality: N/A;   COLONOSCOPY WITH PROPOFOL N/A 09/25/2022   Procedure: COLONOSCOPY WITH PROPOFOL;  Surgeon: Jonathon Bellows, MD;  Location: [facility] ENDOSCOPY;  Service: Gastroenterology;  Laterality: N/A;   COLONOSCOPY WITH PROPOFOL N/A 10/02/2022   Procedure: COLONOSCOPY WITH PROPOFOL;  Surgeon: Jonathon Bellows, MD;  Location: [facility] ENDOSCOPY;  Service: Gastroenterology;  Laterality: N/A;   COLONOSCOPY WITH PROPOFOL N/A 11/13/2022   Procedure: COLONOSCOPY WITH PROPOFOL;  Surgeon: Jonathon Bellows, MD;  Location: [facility] ENDOSCOPY;  Service: Gastroenterology;  Laterality: N/A;   ESOPHAGOGASTRODUODENOSCOPY N/A 05/17/2015   Procedure: ESOPHAGOGASTRODUODENOSCOPY (EGD);  Surgeon: Manya Silvas, MD;  Location: [facility] ENDOSCOPY;  Service: Endoscopy;  Laterality: N/A;   ESOPHAGOGASTRODUODENOSCOPY N/A 09/25/2022   Procedure: ESOPHAGOGASTRODUODENOSCOPY (EGD);  Surgeon: Jonathon Bellows, MD;  Location: [facility] ENDOSCOPY;  Service: Gastroenterology;  Laterality: N/A;   ESOPHAGOGASTRODUODENOSCOPY N/A 09/26/2022   Procedure: ESOPHAGOGASTRODUODENOSCOPY (EGD);  Surgeon: Jonathon Bellows, MD;  Location: [facility] ENDOSCOPY;  Service: Gastroenterology;  Laterality: N/A;   KNEE ARTHROSCOPY     spg     6/18   Tibial Tubercle Bypass Right 1998   TUBAL LIGATION  10/01/1999   Past Medical History:  Diagnosis Date   Acute postoperative pain 04/07/2017   Anxiety    Bursitis    Chronic fatigue 12/12/2017   Chronic fatigue syndrome    Colitis 2021   Diabetes mellitus without complication ([place])    Edema leg 05/02/2015   Fibromyalgia  GERD (gastroesophageal reflux disease)    IBS (irritable bowel syndrome)    Knee pain, bilateral 12/21/2008   Qualifier: Diagnosis of  By: Hassell Done FNP, [place]     Lumbar discitis    Migraines    Osteoarthritis    Right hand pain 04/10/2015   [facility] has done nerve  conduction studies and ruled out carpal tunnel.    Sleep apnea    Spinal stenosis    SVT (supraventricular tachycardia)    Vertigo    Vitamin D deficiency 05/01/2016   LMP 10/22/2021 (Approximate)   Opioid Risk Score:   Fall Risk Score:  `1  Depression screen [facility] 2/9     12/09/2022   10:57 AM 11/15/2022   11:17 AM 10/29/2022    9:01 AM 10/25/2022   11:05 AM 10/01/2022   11:21 AM 09/18/2022   10:43 AM 09/06/2022    8:59 AM  Depression screen PHQ 2/9  Decreased Interest 3 0 0 1 2 0 0  Down, Depressed, Hopeless 3 0 0 1 3 0 0  PHQ - 2 Score 6 0 0 2 5 0 0  Altered sleeping 3 0   2  0  Tired, decreased energy 3 0   1  0  Change in appetite 3 0   0  0  Feeling bad or failure about yourself  3 0   0  0  Trouble concentrating 3 0   0  0  Moving slowly or fidgety/restless 2 0   0  0  Suicidal thoughts 0 0   0  0  PHQ-9 Score 23 0   8  0  Difficult doing work/chores Extremely dIfficult Not difficult at all   Somewhat difficult  Not difficult at all   Review of Systems  Constitutional: Negative.   HENT: Negative.    Eyes: Negative.   Respiratory: Negative.    Cardiovascular: Negative.   Gastrointestinal: Negative.   Endocrine: Negative.   Genitourinary: Negative.   Musculoskeletal:  Positive for back pain.       Left side of body from head to toes  Skin: Negative.   Allergic/Immunologic: Negative.   Neurological:  Positive for dizziness and headaches.  Hematological: Negative.   Psychiatric/Behavioral: Negative.    All other systems reviewed and are negative.      Objective:   Physical Exam Gen: no distress, normal appearing HEENT: oral mucosa pink and moist, NCAT Cardio: Reg rate Chest: normal effort, normal rate of breathing Abd: distended Ext: no edema Psych: pleasant, normal affect Skin: intact Neuro: Alert and oriented x3     Assessment & Plan:  Mrs. Sprouse is a 52 year old woman who presents for f/u with chronic intractable migraines s/p numerous treatments,  severe fibromyalgia, and IBS, and nausea, and vertigo.     1) Vertigo: In the future, restart for cervical myofascial pain syndrome: myofascial release, postural correction, stretching and strengthening of the muscles of the neck and upper back, development of HEP. Conitnue heating pads to muscles of upper back and neck. She is doing HEP.  -referred for vestibular therapy.  -discussed current symptoms.  -discussed response to meclizine -discussed worsening symptoms when in car   2) Migraines: Continue Fioricet, which is one of the medications that helps her. Refilled today. She takes this at least once every time she gets a migraine. Advised to use upon migraine onset and to not use more frequently than q6H during migraine. Refilled Zofran for nausea last week, and this has been  helping her. Continue metoprolol which can be helpful in migraine prophylaxis (HR is well controlled). Prescribed ergot nasal spray to try upon migraine initiation- advised no more frequently than 4 sprays per hour (still awaiting on prior auth). Discussed avoiding foods that may trigger migraines.  Failed vypeti, ajovy, emgality, continue Vypeti trial, increase Vyepti to 300mg  -trial of [employer]em[employer]'s magnesium supplement -prescribed Zavzpret -prescribed Qulipta daily prn.  -continue re-entry phase of elimination diet- she has noted chili pepper and eggs to be triggers for her -failed feverfew supplement -discussed lumbar puncture Does get some benefit from [place] in combination -takes metoprolol -discussed increasing Fioricet or Amitriptyline.  -recommended drinking a glass of water every morning before standing up as her symptoms sound like orthostatic hypotension which could worsen migraines -recommended checking blood pressure daily in supine, sitting, and standing positions and this should help Bradley identify if symptoms are from orthostatic hypotension or intracranial hypertension -discussed trying daily  water first before trying medication acetazolamide.  -continue vitamin D  -Discussed that I can provide refill for her Lyrica when she needs it.  -topamax was not effective.  -When she gets the migraines her loss of words is getting worse.  -Ordered Vyepti- she will find out cost from her pharmacy. She will bring paperwork for this next visit. She is excited to try it.  -d/c nurtec since switched to maxalt -continue ear piercing. Pain feels more like pressure and less acidic.  -no benefits from Botox.  -continue to track migraines.  -Continue Baclofen for pain relief. Minimize use of Hydrocodone. She is only taking Lyrica once per day to make it last. Continue Migranal which is helping. Will occipital nerve block -Discussed that Mrs. Overholser's greatest source of happiness is her family. This community will be essential in helping her recover from her chronic pain and to increase her daily activity. Her daughter is also suffering from similar migraines unfortunately.  -Continue ginger, herbs, turmeric, blueberries, eating real food. Continue cutting down sugar. Using local honey is a great alternative. -Provided with a pain relief journal and discussed that it contains foods and lifestyle tips to naturally help to improve pain. Discussed that these lifestyle strategies are also very good for health unlike some medications which can have negative side effects. Discussed that the act of keeping a journal can be therapeutic and helpful to realize patterns what helps to trigger and alleviate pain.   -Continue Nurtec every other day- may use for breakthrough migraines as well.  -Discussed plan for Vypeti at [facility] infusion center.  Discussed that exercise is one of the most effective treatments for fibromyalgia. This will also help with her obesity. Made goal with Mrs. Akpan to walk outside her home at least once per day, and to garden at least once per day (her favorite activity). Can use elliptical  which she has at home on rainy days. The heat has been oppressive and so she has been trying to do the latter more.  Foods to alleviate migraine:  1) dark leafy greens 2) avocado 3) tuna 4) samon and mackerel 5) beans and legumes Supplements that can be helpful: feverfew, B12, and magnesium  Foods to avoid in migraine: 1) Excessive (or irregular timing) coffee 2) red wine 3) aged cheeses 4) chocolate 5) citrus fruits 6) aspartame and other artifical sweeteners 7) yeast 8) MSG (in processed foods) 9) processed and cured meats 10) nuts and certain seeds 11) chicken livers and other organ meats 12) dairy products like buttermilk, sour  cream, and yogurt 13) dried fruits like dates, figs, and raisins 14) garlic 15) onions 16) potato chips 17) pickled foods like olives and sauerkraut 18) some fresh fruits like ripe banana, papaya, red plums, raspberries, kiwi, pineapple 19) tomato-based products  Recommend to keep a migraine diary: rate daily the severity of your headache (1-10) and what foods you eat that day to help determine patterns.    3) Cervical facet arthrosis: Cervical XR normal- discussed results with patient. Pain is worse on left side.   4) Anxiety and depression -Her daughter was recently diagnosed with sarcoma and this is a great source of stress and fear to Mrs. [name]. It has been a turbulent time for her and this has understandably worsened her symptoms. Discussed benefits of gratitude journaling and she plans to try this.  -discussed her life stressor, her mom's illness -discussed her difficult wean off Pristiq -prescribed zoloft -stop Wellbutrin -prescribed vitamin D -recommended Bi[employer]em[employer]'s magnesium breakthrough -recommended methylated multivitamin -discussed following with Dr. Sima Matas.   5) Insomnia:  -continue amitirptyline to 50mg .  -Try to go outside near sunrise -Get exercise during the day.  -Discussed good sleep hygiene: turning off all  devices an hour before bedtime.  -Chamomile tea with dinner.  -Can consider over the counter melatonin -discussed magnesium  6) Nausea:  -continue zofran, discussed side effect of constipation -start B6 -discussed scopolamine patch, discussed to place behind ear and lave on 72 hours.    7) Cervical myofascial pain syndrome: Will try some trigger point injections with occipital nerve block next visit.  -increase amitriptyline to 100mg  HS  8) Concern for small bowel obstruction: -stat abdominal MRI ordered given no BM since 1/9.  -recommended to go to [facility] to get this done stat -discussed that it would be beneficial to go to the ED.   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 Letta Pate for [facility] -discussed that she has been told she is no longer a candidate for injections, or surgery of spinal cord stimulator -Discussed Qutenza as an option for neuropathic pain control. Discussed that this is a capsaicin patch, stronger than capsaicin cream. Discussed that it is currently approved for diabetic peripheral neuropathy and post-herpetic neuralgia, but that it has also shown benefit in treating other forms of neuropathy. Provided patient with link to site to learn more about the patch: [employer]. Discussed that the patch would be placed in office and benefits usually last 3 months. Discussed that unintended exposure to capsaicin can cause severe irritation of eyes, mucous membranes, respiratory tract, and skin, but that Qutenza is a local treatment and does not have the systemic side effects of other nerve medications. Discussed that there may be pain, itching, erythema, and decreased sensory function associated with the application of Qutenza. Side  effects usually subside within 1 week. A cold pack of analgesic medications can help with these side effects. Blood pressure can also be increased due to pain associated with administration of the patch.  unchanged. Shallow broad-based disc protrusion L4-5 unchanged. -continue Norco -continue turmeric Turmeric to reduce inflammation--can be used in cooking or taken as a supplement.  Benefits of turmeric:  -Highly anti-inflammatory  -Increases antioxidants  -Improves memory, attention, brain disease  -Lowers risk of heart disease  -May help prevent cancer  -Decreases pain  -Alleviates depression  -  Delays aging and decreases risk of chronic disease  -Consume with black pepper to increase absorption    Turmeric Milk Recipe:  1 cup milk  1 tsp turmeric  1 tsp cinnamon  1 tsp grated ginger (optional)  Black pepper (boosts the anti-inflammatory properties of turmeric).  1 tsp honey

## 2022-12-24 NOTE — Progress Notes (Signed)
Neuropsychological Consultation   Patient:   Dana Bradley   DOB:   04-08-1971  MR Number:  [id number]  Location:  [facility] [place], [place] [id number] [place] [address] Dept: [phone]           Date of Service:   10/07/2022  Location of Service and Individuals present: Today's clinical interview was conducted in my outpatient clinic office with the patient myself present.  Start Time:   3 PM End Time:   5 PM  Patient Consent and Confidentiality: Patient agreed to proceed with the psychological evaluation as part of the standard evaluation for consideration of spinal cord stimulator implantation.  Limits of confidentiality were informed particular around making the evaluation available in EMR and available to her treating physicians.  Consent for Evaluation and Treatment:  Signed:  Yes Explanation of Privacy Policies:  Signed:  Yes Discussion of Confidentiality Limits:  Yes  Provider/Observer:  Ilean Skill, Psy.D.       Clinical Neuropsychologist       Billing Code/Service: [phone]  Chief Complaint:    No chief complaint on file.   Reason for Service:    Dana Bradley is a 52 year old female referred for neuropsychological/psychological evaluation as part of a standard psychological evaluation pertaining to consideration for spinal cord stimulator trialing and possible implantation.  The patient has a long history of significant pain issues including pain from lumbar radiculopathy, chronic intractable migraines, severe fibromyalgia, irritable bowel syndrome and vertigo.  The patient also has a history of previous diagnosis of major depressive disorder with depression and anxiety.  Patient describes her primary difficulties having to do with pain and weakness developing.  She describes some changes in cognition including attention and memory that she equates to her pain  levels.  During the clinical interview the patient was aware that the reason for this psychological evaluation was around consideration of spinal cord stimulator.  The patient describes that she experiences normal stressor like events including raising 2 disabled children and her daughter struggling with finishing college and her other daughter dealing with cancer and medical treatments.  Depressive symptomatology have been fairly stable and managed with medications.  Another major stressor has been the patient's mother falling in August with the patient and her siblings doing 24-hour care and the mother dealing with progressive supranuclear palsy.  The patient describes difficulties being disabled and the impact it has on her family and that she does have help to care for herself and her kids worry about her a lot.  She has difficulty walking due to weakness and pain.  The patient describes her pain related to a slipped disc she suffered in July 2020 and progressive degenerative disc disease, protruding disc and stenosis.  She is dealing with arthritis and has been diagnosed with fibromyalgia.  Patient reports that her pain is worse in wintertime.  The patient reports that she has to deal with irritable bowel syndrome impacting her GI status and also has GI changes when her migraines are prevalent.  The patient has participated in physical therapy in 2020 and 2021.  The patient experiences significant pain and weakness due to her lumbar difficulties and needs an assistive device such as canes or crutches/walker or wheelchair to avoid her occasional falls.  Patient has had falls.  Current medications include amitriptyline at night to help with sleep, sertraline for depression and anxiety symptoms.  Patient is on multiple strategies  to try to help with her pain and has also taken lorazepam for anxiety in the past.  Medical History:   Past Medical History:  Diagnosis Date   Acute postoperative pain  04/07/2017   Anxiety    Bursitis    Chronic fatigue 12/12/2017   Chronic fatigue syndrome    Colitis 2021   Diabetes mellitus without complication ([place])    Edema leg 05/02/2015   Fibromyalgia    GERD (gastroesophageal reflux disease)    IBS (irritable bowel syndrome)    Knee pain, bilateral 12/21/2008   Qualifier: Diagnosis of  By: Hassell Done FNP, [place]     Lumbar discitis    Migraines    Osteoarthritis    Right hand pain 04/10/2015   [facility] has done nerve conduction studies and ruled out carpal tunnel.    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[place]) 04/04/2022   Neurogenic bladder 01/28/2022   Urinary and fecal incontinence 01/28/2022   New onset type 2 diabetes mellitus ([place]) 01/02/2022   OSA (obstructive sleep apnea) 12/14/2021   Excessive daytime sleepiness 12/14/2021   Lateral epicondylitis, left elbow 07/19/2021   Abnormal MRI, cervical spine (02/14/2021) 02/28/2021   Cervicalgia 02/13/2021   Abnormal MRI, lumbar spine (12/27/2021) 11/09/2020   Chronic midline thoracic back pain 11/09/2020   Abnormal MRI, thoracic spine (08/02/2019) 11/09/2020   Thoracic spinal stenosis (T7-8) 11/09/2020   Prolapse of thoracic disc with radiculopathy (T7-8) 11/09/2020   Spasm of muscle of lower back 11/09/2020   Vertigo, benign paroxysmal, unspecified laterality [id number]   Uncomplicated opioid dependence ([place]) 11/08/2020   Hyperalgesia 03/07/2020   Neurogenic urinary incontinence 03/01/2020   Other intervertebral disc degeneration, lumbar region 03/01/2020   Spinal stenosis of lumbosacral  region 03/01/2020   Colon cancer screening 02/06/2020   Lumbar radiculitis (Right) 09/21/2019   Chronic lower extremity pain (Bilateral) 09/06/2019   Intractable migraine with aura without status migrainosus 08/10/2019   Other specified dorsopathies, sacral and sacrococcygeal region 08/03/2019   Latex precautions, history of latex allergy 08/03/2019   History of allergy to radiographic contrast media 08/03/2019   DDD (degenerative disc disease), cervical 07/21/2019   Cervical facet syndrome (Bilateral) (L>R) 07/21/2019   DDD (degenerative disc disease), thoracic 07/21/2019   Osteoarthritis of hip (Left) 07/21/2019   Chronic groin pain (Bilateral) (L>R) 07/21/2019   Chronic hip pain (Bilateral) (L>R) 07/21/2019   Somatic dysfunction of sacroiliac joint (Bilateral) 07/21/2019   Migraine with aura and with status migrainosus, not intractable 04/06/2019   Cervico-occipital neuralgia (Left) 04/06/2019   Weakness of leg (Left) 04/05/2019   Difficulty walking 04/05/2019   Chronic migraine without aura, with intractable migraine, so stated, with status migrainosus 12/27/2018   Malar rash 09/04/2018   Trigger point of neck (Left) 03/19/2018   Occipital headache 12/25/2017   Chronic fatigue syndrome with fibromyalgia 12/12/2017   Trigger point of shoulder region (Left) 11/17/2017   Myofascial pain syndrome (Left) (trapezius muscle) 07/22/2017   Lumbar L1-2 disc protrusion (Right) 04/07/2017   Muscle spasticity 04/01/2017   Osteoarthritis of shoulder (Bilateral)  04/01/2017   Lumbar spondylosis 01/06/2017   Chronic hip pain (Left) 12/24/2016   Chronic sacroiliac joint pain (Left) 12/24/2016   Lumbar facet syndrome (Bilateral) (L>R) 12/24/2016   Lumbar radiculitis (Left) 12/24/2016   Hypertriglyceridemia 11/27/2016   History of vasovagal episode 10/30/2016   Cervicogenic headache 09/09/2016   Medication monitoring encounter 08/29/2016   Controlled substance agreement signed 08/28/2016    Plantar fasciitis of left foot 08/28/2016   Vitamin B12 deficiency 08/28/2016   Hyperlipidemia 08/28/2016   Nephrolithiasis 08/12/2016   Chronic pain syndrome 08/07/2016   Long term prescription opiate use 08/07/2016   Opiate use 08/07/2016   Long term prescription benzodiazepine use 08/07/2016   Neurogenic pain 08/07/2016   Chronic low back pain (1ry area of Pain) (Bilateral) (R>L) (midline) w/ sciatica (Bilateral) 08/07/2016   Chronic upper back pain (2ry area of Pain) (Bilateral) (L>R) 08/07/2016   Chronic abdominal pain (Right lower quadrant) 08/07/2016   Thoracic radiculitis (Bilateral: T10, T11) 08/07/2016   Chronic occipital neuralgia (3ry area of Pain) (Bilateral) (L>R) 08/07/2016   Chronic neck pain 08/07/2016   Chronic cervical radicular pain (Bilateral) (L>R) 08/07/2016   Chronic shoulder blade pain (Bilateral) (L>R) 08/07/2016   Chronic upper extremity pain (Bilateral) (R>L) 08/07/2016   Chronic knee pain (Bilateral) (R>L) 08/07/2016   Chronic ankle pain (Bilateral) 08/07/2016   Cervical spondylosis with myelopathy and radiculopathy 08/07/2016   Panic disorder with agoraphobia 05/29/2016   Depression, unspecified depression type 05/29/2016   Atypical lymphocytosis 05/01/2016   Vitamin D insufficiency 05/01/2016   Chronic lower extremity cramps (Bilateral) (R>L) 04/29/2016   Obesity, Class III, BMI 40-49.9 (morbid obesity) ([place]) 04/29/2016   GAD (generalized anxiety disorder) 04/29/2016   Fatigue 04/29/2016   Insomnia 07/12/2015   Migraine without aura and with status migrainosus, not intractable 07/12/2015   Chronic superficial gastritis 06/02/2015   Chronic pain of multiple joints 05/15/2015   Bilateral leg edema 05/02/2015   Paroxysmal supraventricular tachycardia ([place]) 04/17/2015   Exertional shortness of breath 04/17/2015   Bright red rectal bleeding 04/06/2015   DDD (degenerative disc disease), lumbosacral 01/24/2014   DDD (degenerative disc disease), lumbar  01/24/2014   Cervico-occipital neuralgia 12/29/2013   Fibromyalgia 12/29/2013   Migraine headache 12/29/2013   Menorrhagia 12/10/2012   Depression, major, recurrent, in remission ([place]) 01/12/2009   Chest pain 01/12/2009   Hypertension, benign essential, goal below 140/90 06/23/2008   History of PSVT (paroxysmal supraventricular tachycardia) 06/17/2008   Obstructive sleep apnea 06/17/2008   GERD 06/13/2008    Onset and Duration of Symptoms: Patient's primary pain symptoms developed in July 2020 with progression related to degenerative disc disease and stenosis.  Patient has had significant psychosocial stressors and deals with pain and physical limitations  Progression of Symptoms: Pain continues to be debilitating with attempts at PT rehabilitation in 2020 in 2021.  Associated Symptoms (e.g., cognitive, emotional, behavioral): Patient does report some changes in cognition particularly memory and attention but equates this to her pain levels primarily as well as use of pain medicines.  Additional Tests and Measures from other records:  Neuroimaging Results: Patient has had multiple neuroimaging studies that can be found in her EMR including MRI thoracic, lumbar and cervical.  Current Typical Mood State:  Anxious and Sad  Sleep: Patient reports that her sleep is rather poor and it has been particularly bad over the past week.  The patient does have a diagnosis of obstructive sleep apnea and is compliant with her CPAP device.  Patient describes vivid dreams.  She denies  or vivid dreams to be nightmares in nature but simply very "weird."  She reports that these vivid dreams happen 10-15 times per month.  Behavioral Observation/Mental Status:   Cyril Calender  presents as a 52 y.o.-year-old Right handed Caucasian Female who appeared her stated age. her dress was Appropriate and she was Well Groomed and her manners were Appropriate to the situation.  her participation was indicative of Appropriate  behaviors.  There were physical disabilities noted.  she displayed an appropriate level of cooperation and motivation.    Interactions:    Active Appropriate  Attention:   abnormal and attention span appeared shorter than expected for age  Memory:   within normal limits; recent and remote memory intact  Visuo-spatial:   not examined  Speech (Volume):  normal  Speech:   normal; normal  Thought Process:  Coherent and Relevant  Coherent and Organized  Though Content:  WNL; not suicidal and not homicidal  Orientation:   person, place, time/date, and situation  Judgment:   Good  Planning:   Fair  Affect:    Anxious  Mood:    Dysphoric  Insight:   Good  Intelligence:   normal  Marital Status/Living:  The patient was born and raised in [place] along with 2 siblings.  She continues to live with her spouse of 57 years and 2 adult children.  The patient was never previously married.  Patient has 3 total adult children age 30, 50 and 90.  One of her daughters is dealt with significant anxiety and depression as well as history of seizures and her youngest daughter has recently been diagnosed with cancer.  Educational and Occupational History:     Highest Level of Education:   The patient is graduated from high school and has had some college courses but no completed diploma in college.  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  Patient reports that she has had multiple temporary and seasonal type jobs.  Hobbies and Interests: Working with Lexicographer of Symptoms on Work or School:  Patient has had significant limitation on her capacity to work.  Impact of Symptoms on Social Functioning and Interpersonal Relationships: Patient is concerned about how much her family needs to help with her and also the needs that her family has  that she has difficulty helping them for.   Psychiatric History:  Patient has a history of depression and anxiety diagnoses with a major depressive disorder diagnosis.  Family Med/Psych History:  Family History  Problem Relation Age of Onset   Depression Mother    Hypertension Mother    Cancer Mother        Skin   Hyperlipidemia Mother    Anxiety disorder Mother    Migraines Mother    Alcohol abuse Father    Depression Father    Stroke Father    Heart disease Father    Hypertension Father    Anxiety disorder Father    Depression Sister    Hyperlipidemia Sister    Diabetes Sister    Hypertension Sister    Polycystic ovary syndrome Sister    Bipolar disorder Sister    Anxiety disorder Sister    Migraines Sister    Depression Sister    Hypertension Sister    Anxiety disorder Sister  Migraines Sister    Breast cancer Maternal Grandmother 73   Cancer Maternal Grandmother 63       Breast   Thyroid disease Maternal Grandmother    Arthritis Maternal Grandmother    Hyperlipidemia Maternal Grandmother    Aneurysm Maternal Grandfather    Hypertension Maternal Grandfather    Heart disease Maternal Grandfather    Alzheimer's disease Paternal Grandmother    Heart attack Paternal Grandfather    Hypertension Paternal Grandfather    COPD Paternal Grandfather    Heart disease Paternal Grandfather    Migraines Daughter    Other Daughter        leomyoa scaroma   Migraines Daughter    Migraines Son    Alzheimer's disease Other    Bladder Cancer Neg Hx    Kidney cancer Neg Hx    Impression/DX:   Shahera Oveson is a 52 year old female referred for neuropsychological/psychological evaluation as part of a standard psychological evaluation pertaining to consideration for spinal cord stimulator trialing and possible implantation.  The patient has a long history of significant pain issues including pain from lumbar radiculopathy, chronic intractable migraines, severe fibromyalgia, irritable  bowel syndrome and vertigo.  The patient also has a history of previous diagnosis of major depressive disorder with depression and anxiety.  Patient describes her primary difficulties having to do with pain and weakness developing.  She describes some changes in cognition including attention and memory that she equates to her pain levels.  Disposition/Plan:  The patient will complete objective psychological measures including the [place] multiphasic personality inventory-2 as well as the pain patient profile measures.  Once these are completed a formal report report will be completed and provided to her referring physician.  Diagnosis:    Lumbar radiculitis  Major depressive disorder with current active episode, unspecified depression episode severity, unspecified whether recurrent  Trigeminal neuralgia  Fibromyalgia  Migraine without aura and with status migrainosus, not intractable        Note: This document was prepared using Dragon voice recognition software and may include unintentional dictation errors.   Electronically Signed   _______________________ Ilean Skill, Psy.D. Clinical Neuropsychologist

## 2022-12-24 NOTE — Telephone Encounter (Signed)
Dana Bradley said the Vyepti was sent to the wrong pharmacy. Please send to the [facility]. She also stated you are increasing the dose to 300 mg.   Call back phone [phone].

## 2022-12-25 ENCOUNTER — Other Ambulatory Visit: Payer: Self-pay | Admitting: Family Medicine

## 2022-12-25 ENCOUNTER — Telehealth: Payer: Self-pay | Admitting: *Deleted

## 2022-12-25 DIAGNOSIS — E010 Iodine-deficiency related diffuse (endemic) goiter: Secondary | ICD-10-CM

## 2022-12-25 DIAGNOSIS — R221 Localized swelling, mass and lump, neck: Secondary | ICD-10-CM

## 2022-12-25 DIAGNOSIS — R1310 Dysphagia, unspecified: Secondary | ICD-10-CM

## 2022-12-25 DIAGNOSIS — E079 Disorder of thyroid, unspecified: Secondary | ICD-10-CM

## 2022-12-25 DIAGNOSIS — E042 Nontoxic multinodular goiter: Secondary | ICD-10-CM

## 2022-12-25 NOTE — Telephone Encounter (Signed)
Dana Bradley ([name] - [id number] Qulipta 10MG  tablets Status: PA RequestCreated: March 26th, 2024 [phone]

## 2022-12-25 NOTE — Telephone Encounter (Signed)
Outcome Approved today PA Case: [id number], Status: Approved, Coverage Starts on: 12/25/2022 12:00:00 AM, Coverage Ends on: 03/25/2023 12:00:00 AM.

## 2022-12-25 NOTE — Telephone Encounter (Signed)
Patient informed. 

## 2022-12-26 ENCOUNTER — Ambulatory Visit: Payer: Medicaid Other | Admitting: Psychology

## 2022-12-26 ENCOUNTER — Ambulatory Visit: Payer: Medicaid Other | Admitting: Family Medicine

## 2022-12-31 NOTE — Telephone Encounter (Signed)
Due to symptoms, I and her PCP are concerned about continued use of this medication and low BP. I recommend follow up to discuss these symptoms prior to providing prolonged supply of medication. If she is worried about her BP getting high and wishes to proceed with taking the Metoprolol for now, there is a script available and she can use GoodRx to help with cost/savings.

## 2023-01-01 ENCOUNTER — Telehealth: Payer: Self-pay | Admitting: Family Medicine

## 2023-01-01 NOTE — Telephone Encounter (Signed)
Spoke to Dana Bradley, she states lab needs to be changed to [id number] and select if you would like to send the fluid for lab (per your preference). Dana Bradley is aware you are out of the office and can wait till you are back in office.

## 2023-01-01 NOTE — Telephone Encounter (Signed)
Copied from [place] [phone]. Topic: General - Other >> Jan 01, 2023 10:33 AM Everette C wrote: Reason for CRM: Marcie Bal with [facility] has called to further discuss the patient's recent Thyroid biopsy orders   Please contact Marcie Bal further when possible to discuss the needed changes prior to resubmission

## 2023-01-03 ENCOUNTER — Encounter: Payer: Self-pay | Admitting: Gastroenterology

## 2023-01-06 ENCOUNTER — Other Ambulatory Visit: Payer: Self-pay | Admitting: Physical Medicine and Rehabilitation

## 2023-01-06 ENCOUNTER — Other Ambulatory Visit: Payer: Self-pay | Admitting: Pharmacy Technician

## 2023-01-06 ENCOUNTER — Other Ambulatory Visit (HOSPITAL_COMMUNITY): Payer: Self-pay

## 2023-01-06 ENCOUNTER — Other Ambulatory Visit: Payer: Self-pay

## 2023-01-06 DIAGNOSIS — G4733 Obstructive sleep apnea (adult) (pediatric): Secondary | ICD-10-CM | POA: Diagnosis not present

## 2023-01-06 NOTE — Telephone Encounter (Signed)
I can see her today.

## 2023-01-06 NOTE — Addendum Note (Signed)
Addended by: Danelle Berry on: 01/06/2023 12:44 PM   Modules accepted: Orders

## 2023-01-07 ENCOUNTER — Encounter: Payer: Self-pay | Admitting: Family Medicine

## 2023-01-07 ENCOUNTER — Telehealth: Payer: Self-pay | Admitting: Pharmacy Technician

## 2023-01-07 ENCOUNTER — Ambulatory Visit: Payer: Medicaid Other | Admitting: Family Medicine

## 2023-01-07 VITALS — BP 122/76 | HR 96 | Temp 98.2°F | Resp 18 | Ht 63.0 in | Wt 229.9 lb

## 2023-01-07 DIAGNOSIS — R55 Syncope and collapse: Secondary | ICD-10-CM | POA: Diagnosis not present

## 2023-01-07 DIAGNOSIS — M79642 Pain in left hand: Secondary | ICD-10-CM | POA: Diagnosis not present

## 2023-01-07 DIAGNOSIS — M5481 Occipital neuralgia: Secondary | ICD-10-CM

## 2023-01-07 DIAGNOSIS — M79672 Pain in left foot: Secondary | ICD-10-CM | POA: Diagnosis not present

## 2023-01-07 DIAGNOSIS — M79641 Pain in right hand: Secondary | ICD-10-CM

## 2023-01-07 DIAGNOSIS — R42 Dizziness and giddiness: Secondary | ICD-10-CM

## 2023-01-07 DIAGNOSIS — R269 Unspecified abnormalities of gait and mobility: Secondary | ICD-10-CM | POA: Diagnosis not present

## 2023-01-07 DIAGNOSIS — G43001 Migraine without aura, not intractable, with status migrainosus: Secondary | ICD-10-CM

## 2023-01-07 DIAGNOSIS — M79675 Pain in left toe(s): Secondary | ICD-10-CM

## 2023-01-07 DIAGNOSIS — I471 Supraventricular tachycardia, unspecified: Secondary | ICD-10-CM

## 2023-01-07 DIAGNOSIS — L989 Disorder of the skin and subcutaneous tissue, unspecified: Secondary | ICD-10-CM

## 2023-01-07 DIAGNOSIS — M542 Cervicalgia: Secondary | ICD-10-CM | POA: Diagnosis not present

## 2023-01-07 MED ORDER — METOPROLOL SUCCINATE ER 100 MG PO TB24
100.0000 mg | ORAL_TABLET | Freq: Every day | ORAL | 0 refills | Status: DC
Start: 2023-01-07 — End: 2023-05-20

## 2023-01-07 NOTE — Addendum Note (Signed)
Addended by: Danelle Berry on: 01/07/2023 12:29 PM   Modules accepted: Orders

## 2023-01-07 NOTE — Progress Notes (Deleted)
Acute Office Visit   Patient: Dana Bradley   DOB: [birth year]   52 y.o. Female  MRN: [id number] Visit Date: 01/07/2023  Today's healthcare provider: Danelle Berry, PA-C  Introduced myself to the patient as a PA-C and provided education on APPs in clinical practice.    No chief complaint on file.  Subjective    HPI    Medications: Outpatient Medications Prior to Visit  Medication Sig   amitriptyline (ELAVIL) 100 MG tablet Take 1 tablet (100 mg total) by mouth at bedtime.   Atogepant (QULIPTA) 10 MG TABS Take 1 tablet by mouth daily as needed.   atorvastatin (LIPITOR) 40 MG tablet TAKE ONE TABLET BY MOUTH EVERY NIGHT AT BEDTIME   baclofen (LIORESAL) 10 MG tablet Take 0.5-1 tablets (5-10 mg total) by mouth 3 (three) times daily. Each refill must last 30 days.   butalbital-acetaminophen-caffeine (BAC) 50-325-40 MG tablet Take 1 tablet by mouth every 4 (four) hours as needed for migraine.   cyanocobalamin (,VITAMIN B-12,) 1000 MCG/ML injection INJECT 1 ML (1,000 MCG) INTO THE MUSCLE ONCE FOR 1 DOSE   diclofenac Sodium (VOLTAREN) 1 % GEL Apply topically.   dicyclomine (BENTYL) 10 MG capsule Take 1 capsule (10 mg total) by mouth 4 (four) times daily as needed (before meals and at bedtime)   dicyclomine (BENTYL) 10 MG capsule Take by mouth.   Eptinezumab-jjmr (VYEPTI) 100 MG/ML injection Inject 3 mLs (300 mg total) into the vein every 3 (three) months.   HYDROcodone-acetaminophen (NORCO) 7.5-325 MG tablet Take 1 tablet by mouth 2 (two) times daily as needed for severe pain. Must last 30 days (Patient not taking: Reported on 12/24/2022)   [START ON 01/17/2023] HYDROcodone-acetaminophen (NORCO) 7.5-325 MG tablet Take 1 tablet by mouth 2 (two) times daily as needed for severe pain. Must last 30 days   [START ON 02/16/2023] HYDROcodone-acetaminophen (NORCO) 7.5-325 MG tablet Take 1 tablet by mouth 2 (two) times daily as needed for severe pain. Must last 30 days (Patient not taking: Reported on  12/24/2022)   linaclotide (LINZESS) 290 MCG CAPS capsule Take 1 capsule (290 mcg total) by mouth daily before breakfast.   lipase/protease/amylase (CREON) 12000-38000 units CPEP capsule Take 2 capsules by mouth with the first bite of each meal and take 1 capsule before snacks. (Max of 8 capsules per day).   LORazepam (ATIVAN) 1 MG tablet Take by mouth.   meclizine (ANTIVERT) 12.5 MG tablet Take 1 tablet (12.5 mg total) by mouth 3 (three) times daily as needed for dizziness.   medroxyPROGESTERone (PROVERA) 5 MG tablet TAKE ONE TABLET BY MOUTH DAILY   metFORMIN (GLUCOPHAGE) 1000 MG tablet TAKE ONE TABLET BY MOUTH TWICE A DAY WITH A MEAL   metoprolol tartrate (LOPRESSOR) 50 MG tablet TAKE 1 AND 1/2 TABLET BY MOUTH 2 TIMES DAILY   naloxone (NARCAN) nasal spray 4 mg/0.1 mL Place 1 spray into the nose as needed for up to 365 doses (for opioid-induced respiratory depresssion). In case of emergency (overdose), spray once into each nostril. If no response within 3 minutes, repeat application and call 911.   Needles & Syringes MISC For administration of B12 injections   omeprazole (PRILOSEC) 40 MG capsule Take 40 mg by mouth daily.   ondansetron (ZOFRAN) 4 MG tablet Take 1 tablet (4 mg total) by mouth every 8 (eight) hours as needed.   pregabalin (LYRICA) 150 MG capsule TAKE 1 CAPSULE BY MOUTH EVERY 8 HOURS   Rimegepant Sulfate 75 MG  TBDP Take 1 tablet by mouth every other day.   rizatriptan (MAXALT) 10 MG tablet Take 1 tablet (10 mg total) by mouth as needed for migraine. May repeat in 2 hours if needed   scopolamine (TRANSDERM-SCOP) 1 MG/3DAYS Place 1 patch (1.5 mg total) onto the skin every 3 (three) days.   sertraline (ZOLOFT) 50 MG tablet Take 1 tablet (50 mg total) by mouth daily.   VALERIAN PO Take by mouth as needed. Makes Valerian tea about 3-4 times per week.   Vitamin D, Ergocalciferol, (DRISDOL) 1.25 MG (50000 UNIT) CAPS capsule Take 1 capsule (50,000 Units total) by mouth every 7 (seven) days.    No facility-administered medications prior to visit.    Review of Systems  {Labs  Heme  Chem  Endocrine  Serology  Results Review (optional):23779}   Objective    LMP 10/22/2021 (Approximate)  {Show previous vital signs (optional):23777}  Physical Exam    No results found for any visits on 01/07/23.  Assessment & Plan      No follow-ups on file.

## 2023-01-07 NOTE — Progress Notes (Signed)
Patient ID: Dana SchoonerKara Blakely, female    DOB: 11/03/1970, 52 y.o.   MRN: [id number]  PCP: Danelle Berryapia, Sopheap Boehle, PA-C  Chief Complaint  Patient presents with   Hypertension   Fatigue    Subjective:   Dana Bradley is a 52 y.o. female, presents to clinic with CC of the following:  HPI  Near syncope and blacking out episodes usually occurs when standing up or sitting up With her last infusion last month she had sig visual blacking out but did not loose consciousness  Sometimes she feels like her placement of feet or stepping is not coordinate - not a loss of balance but that she doesn't know where her foot is and its stopped With sitting up or standing up sometimes she feels like she could black out and often will feel like she'd veer to the left side  Symptomatic with going from laying to standing 122/74 95 132/80 103 sitting 120/76 105 pulse  Working with PM&R for chronic pain, mobility and migraines Previously she would use nurtec with fioricet for breakthrough migraines She is doing a lot of meds for constipation and having watery loose stool. On linzess 290mcg dose   Patient Active Problem List   Diagnosis Date Noted   Abnormal CT of the abdomen 11/13/2022   Adenomatous polyp of colon 11/13/2022   Herniated nucleus pulposus, L3-4 (Right) 10/29/2022   Lumbar radiculopathy 10/21/2022   Dyspepsia 09/26/2022   Blood in stool 09/26/2022   Disease of spinal cord 04/04/2022   Neurogenic bladder 01/28/2022   Urinary and fecal incontinence 01/28/2022   New onset type 2 diabetes mellitus 01/02/2022   OSA (obstructive sleep apnea) 12/14/2021   Excessive daytime sleepiness 12/14/2021   Lateral epicondylitis, left elbow 07/19/2021   Abnormal MRI, cervical spine (02/14/2021) 02/28/2021   Cervicalgia 02/13/2021   Abnormal MRI, lumbar spine (12/27/2021) 11/09/2020   Chronic midline thoracic back pain 11/09/2020   Abnormal MRI, thoracic spine (08/02/2019) 11/09/2020   Thoracic spinal stenosis  (T7-8) 11/09/2020   Prolapse of thoracic disc with radiculopathy (T7-8) 11/09/2020   Spasm of muscle of lower back 11/09/2020   Vertigo, benign paroxysmal, unspecified laterality 11/09/2020   Uncomplicated opioid dependence 11/08/2020   Hyperalgesia 03/07/2020   Neurogenic urinary incontinence 03/01/2020   Other intervertebral disc degeneration, lumbar region 03/01/2020   Spinal stenosis of lumbosacral region 03/01/2020   Colon cancer screening 02/06/2020   Lumbar radiculitis (Right) 09/21/2019   Chronic lower extremity pain (Bilateral) 09/06/2019   Intractable migraine with aura without status migrainosus 08/10/2019   Other specified dorsopathies, sacral and sacrococcygeal region 08/03/2019   Latex precautions, history of latex allergy 08/03/2019   History of allergy to radiographic contrast media 08/03/2019   DDD (degenerative disc disease), cervical 07/21/2019   Cervical facet syndrome (Bilateral) (L>R) 07/21/2019   DDD (degenerative disc disease), thoracic 07/21/2019   Osteoarthritis of hip (Left) 07/21/2019   Chronic groin pain (Bilateral) (L>R) 07/21/2019   Chronic hip pain (Bilateral) (L>R) 07/21/2019   Somatic dysfunction of sacroiliac joint (Bilateral) 07/21/2019   Migraine with aura and with status migrainosus, not intractable 04/06/2019   Cervico-occipital neuralgia (Left) 04/06/2019   Weakness of leg (Left) 04/05/2019   Difficulty walking 04/05/2019   Chronic migraine without aura, with intractable migraine, so stated, with status migrainosus 12/27/2018   Malar rash 09/04/2018   Trigger point of neck (Left) 03/19/2018   Occipital headache 12/25/2017   Chronic fatigue syndrome with fibromyalgia 12/12/2017   Trigger point of shoulder region (Left) 11/17/2017  Myofascial pain syndrome (Left) (trapezius muscle) 07/22/2017   Lumbar L1-2 disc protrusion (Right) 04/07/2017   Muscle spasticity 04/01/2017   Osteoarthritis of shoulder (Bilateral) 04/01/2017   Lumbar  spondylosis 01/06/2017   Chronic hip pain (Left) 12/24/2016   Chronic sacroiliac joint pain (Left) 12/24/2016   Lumbar facet syndrome (Bilateral) (L>R) 12/24/2016   Lumbar radiculitis (Left) 12/24/2016   Hypertriglyceridemia 11/27/2016   History of vasovagal episode 10/30/2016   Cervicogenic headache 09/09/2016   Medication monitoring encounter 08/29/2016   Controlled substance agreement signed 08/28/2016   Plantar fasciitis of left foot 08/28/2016   Vitamin B12 deficiency 08/28/2016   Hyperlipidemia 08/28/2016   Nephrolithiasis 08/12/2016   Chronic pain syndrome 08/07/2016   Long term prescription opiate use 08/07/2016   Opiate use 08/07/2016   Long term prescription benzodiazepine use 08/07/2016   Neurogenic pain 08/07/2016   Chronic low back pain (1ry area of Pain) (Bilateral) (R>L) (midline) w/ sciatica (Bilateral) 08/07/2016   Chronic upper back pain (2ry area of Pain) (Bilateral) (L>R) 08/07/2016   Chronic abdominal pain (Right lower quadrant) 08/07/2016   Thoracic radiculitis (Bilateral: T10, T11) 08/07/2016   Chronic occipital neuralgia (3ry area of Pain) (Bilateral) (L>R) 08/07/2016   Chronic neck pain 08/07/2016   Chronic cervical radicular pain (Bilateral) (L>R) 08/07/2016   Chronic shoulder blade pain (Bilateral) (L>R) 08/07/2016   Chronic upper extremity pain (Bilateral) (R>L) 08/07/2016   Chronic knee pain (Bilateral) (R>L) 08/07/2016   Chronic ankle pain (Bilateral) 08/07/2016   Cervical spondylosis with myelopathy and radiculopathy 08/07/2016   Panic disorder with agoraphobia 05/29/2016   Depression, unspecified depression type 05/29/2016   Atypical lymphocytosis 05/01/2016   Vitamin D insufficiency 05/01/2016   Chronic lower extremity cramps (Bilateral) (R>L) 04/29/2016   Obesity, Class III, BMI 40-49.9 (morbid obesity) 04/29/2016   GAD (generalized anxiety disorder) 04/29/2016   Fatigue 04/29/2016   Insomnia 07/12/2015   Migraine without aura and with status  migrainosus, not intractable 07/12/2015   Chronic superficial gastritis 06/02/2015   Chronic pain of multiple joints 05/15/2015   Bilateral leg edema 05/02/2015   Paroxysmal supraventricular tachycardia (HCC) 04/17/2015   Exertional shortness of breath 04/17/2015   Bright red rectal bleeding 04/06/2015   DDD (degenerative disc disease), lumbosacral 01/24/2014   DDD (degenerative disc disease), lumbar 01/24/2014   Cervico-occipital neuralgia 12/29/2013   Fibromyalgia 12/29/2013   Migraine headache 12/29/2013   Menorrhagia 12/10/2012   Depression, major, recurrent, in remission (HCC) 01/12/2009   Chest pain 01/12/2009   Hypertension, benign essential, goal below 140/90 06/23/2008   History of PSVT (paroxysmal supraventricular tachycardia) 06/17/2008   Obstructive sleep apnea 06/17/2008   GERD 06/13/2008      Current Outpatient Medications:    amitriptyline (ELAVIL) 100 MG tablet, Take 1 tablet (100 mg total) by mouth at bedtime., Disp: 90 tablet, Rfl: 3   Atogepant (QULIPTA) 10 MG TABS, Take 1 tablet by mouth daily as needed., Disp: 90 tablet, Rfl: 3   atorvastatin (LIPITOR) 40 MG tablet, TAKE ONE TABLET BY MOUTH EVERY NIGHT AT BEDTIME, Disp: 90 tablet, Rfl: 3   baclofen (LIORESAL) 10 MG tablet, Take 0.5-1 tablets (5-10 mg total) by mouth 3 (three) times daily. Each refill must last 30 days., Disp: 270 tablet, Rfl: 1   butalbital-acetaminophen-caffeine (BAC) 50-325-40 MG tablet, Take 1 tablet by mouth every 4 (four) hours as needed for migraine., Disp: 60 tablet, Rfl: 3   cyanocobalamin (,VITAMIN B-12,) 1000 MCG/ML injection, INJECT 1 ML (1,000 MCG) INTO THE MUSCLE ONCE FOR 1 DOSE, Disp: 1 mL,  Rfl: 3   diclofenac Sodium (VOLTAREN) 1 % GEL, Apply topically., Disp: , Rfl:    dicyclomine (BENTYL) 10 MG capsule, Take by mouth., Disp: , Rfl:    Eptinezumab-jjmr (VYEPTI) 100 MG/ML injection, Inject 3 mLs (300 mg total) into the vein every 3 (three) months., Disp: 1.12 mL, Rfl: 3    HYDROcodone-acetaminophen (NORCO) 7.5-325 MG tablet, Take 1 tablet by mouth 2 (two) times daily as needed for severe pain. Must last 30 days, Disp: 25 tablet, Rfl: 0   [START ON 01/17/2023] HYDROcodone-acetaminophen (NORCO) 7.5-325 MG tablet, Take 1 tablet by mouth 2 (two) times daily as needed for severe pain. Must last 30 days, Disp: 25 tablet, Rfl: 0   [START ON 02/16/2023] HYDROcodone-acetaminophen (NORCO) 7.5-325 MG tablet, Take 1 tablet by mouth 2 (two) times daily as needed for severe pain. Must last 30 days, Disp: 25 tablet, Rfl: 0   linaclotide (LINZESS) 290 MCG CAPS capsule, Take 1 capsule (290 mcg total) by mouth daily before breakfast., Disp: 30 capsule, Rfl: 2   lipase/protease/amylase (CREON) 12000-38000 units CPEP capsule, Take 2 capsules by mouth with the first bite of each meal and take 1 capsule before snacks. (Max of 8 capsules per day)., Disp: 270 capsule, Rfl: 1   LORazepam (ATIVAN) 1 MG tablet, Take by mouth., Disp: , Rfl:    meclizine (ANTIVERT) 12.5 MG tablet, Take 1 tablet (12.5 mg total) by mouth 3 (three) times daily as needed for dizziness., Disp: 30 tablet, Rfl: 0   medroxyPROGESTERone (PROVERA) 5 MG tablet, TAKE ONE TABLET BY MOUTH DAILY, Disp: 90 tablet, Rfl: 1   metFORMIN (GLUCOPHAGE) 1000 MG tablet, TAKE ONE TABLET BY MOUTH TWICE A DAY WITH A MEAL, Disp: 180 tablet, Rfl: 1   metoprolol tartrate (LOPRESSOR) 50 MG tablet, TAKE 1 AND 1/2 TABLET BY MOUTH 2 TIMES DAILY, Disp: 135 tablet, Rfl: 3   naloxone (NARCAN) nasal spray 4 mg/0.1 mL, Place 1 spray into the nose as needed for up to 365 doses (for opioid-induced respiratory depresssion). In case of emergency (overdose), spray once into each nostril. If no response within 3 minutes, repeat application and call 911., Disp: 1 each, Rfl: 0   Needles & Syringes MISC, For administration of B12 injections, Disp: 30 each, Rfl: 0   omeprazole (PRILOSEC) 40 MG capsule, Take 40 mg by mouth daily., Disp: , Rfl:    ondansetron (ZOFRAN) 4  MG tablet, Take 1 tablet (4 mg total) by mouth every 8 (eight) hours as needed., Disp: 20 tablet, Rfl: 2   pregabalin (LYRICA) 150 MG capsule, TAKE 1 CAPSULE BY MOUTH EVERY 8 HOURS, Disp: 270 capsule, Rfl: 3   Rimegepant Sulfate 75 MG TBDP, Take 1 tablet by mouth every other day., Disp: , Rfl:    rizatriptan (MAXALT) 10 MG tablet, Take 1 tablet (10 mg total) by mouth as needed for migraine. May repeat in 2 hours if needed, Disp: 10 tablet, Rfl: 3   scopolamine (TRANSDERM-SCOP) 1 MG/3DAYS, Place 1 patch (1.5 mg total) onto the skin every 3 (three) days., Disp: 10 patch, Rfl: 12   sertraline (ZOLOFT) 50 MG tablet, Take 1 tablet (50 mg total) by mouth daily., Disp: 90 tablet, Rfl: 3   VALERIAN PO, Take by mouth as needed. Makes Valerian tea about 3-4 times per week., Disp: , Rfl:    Vitamin D, Ergocalciferol, (DRISDOL) 1.25 MG (50000 UNIT) CAPS capsule, Take 1 capsule (50,000 Units total) by mouth every 7 (seven) days., Disp: 7 capsule, Rfl: 0   dicyclomine (BENTYL)  10 MG capsule, Take 1 capsule (10 mg total) by mouth 4 (four) times daily as needed (before meals and at bedtime), Disp: 360 capsule, Rfl: 1   Allergies  Allergen Reactions   Aspirin Swelling   Cephalexin Rash   Cymbalta [Duloxetine Hcl] Other (See Comments)    Suicidal ideations and has homicidal thoughts per patient   Depakote [Divalproex Sodium] Shortness Of Breath    W/ n/v   Gadolinium Derivatives     Pt was unable to breath Other reaction(s): Other (See Comments) Pt was unable to breath   Haloperidol Shortness Of Breath    W/ n/v   Meperidine Nausea And Vomiting    Patient projectile vomits and usually result in ER Other reaction(s): Vomiting   Metoclopramide Shortness Of Breath    Can't breath, wheezes Other reaction(s): Other (See Comments)   Morphine     Other reaction(s): Vomiting   Penicillins Rash    Other reaction(s): Unknown   Prochlorperazine Other (See Comments)    Panic attack Other reaction(s): Other  (See Comments)   Tramadol Hcl Palpitations    Severely and adversely affects her SVT giving her tachycardias of 150-160 bpm.   Trazodone Shortness Of Breath    Other reaction(s): Other (See Comments)   Meloxicam Other (See Comments)    mouth sores, tingling, blisters in mouth Other reaction(s): Other (See Comments) mout sores, tingling mouth sores, tingling, blisters in mouth   Neomycin-Bacitracin Zn-Polymyx Rash   Tomato Hives    Tongue will blister   Egg-Derived Products Other (See Comments)    inflammation   Milk-Related Compounds Other (See Comments)    inflammation   Other     Other reaction(s): Other (See Comments)   Shellfish Allergy     Other reaction(s): Unknown   Shellfish-Derived Products Other (See Comments)    Other reaction(s): Unknown Other reaction(s): Unknown   Bacitra-Neomycin-Polymyxin-Hc Rash   Bacitracin-Neomycin-Polymyxin Rash   Cephalosporins Rash    rash   Ibuprofen Other (See Comments) and Rash    Blisters in mouth. Blisters in mouth.   Latex Itching    Other reaction(s): Unknown   Nsaids Other (See Comments)    Blisters in mouth; can take 1 ibuprofen 2x a month Other reaction(s): Other (See Comments), Unknown Blisters in mouth; can take 1 ibuprofen 2x a month   Sulfa Antibiotics Rash    rash Other reaction(s): Unknown rash  Other reaction(s): Unknown rash rash Other reaction(s): Unknown rash   Sulfonamide Derivatives Rash     Social History   Tobacco Use   Smoking status: Former    Packs/day: 2.00    Years: 3.00    Additional pack years: 0.00    Total pack years: 6.00    Types: Cigarettes    Quit date: 12/10/1992    Years since quitting: 30.0   Smokeless tobacco: Never   Tobacco comments:    quit 25 years ago  Vaping Use   Vaping Use: Never used  Substance Use Topics   Alcohol use: No    Comment: socially   Drug use: No      Chart Review Today: I personally reviewed active problem list, medication list, allergies,  family history, social history, health maintenance, notes from last encounter, lab results, imaging with the patient/caregiver today.   Review of Systems  Constitutional: Negative.   HENT: Negative.    Eyes: Negative.   Respiratory: Negative.    Cardiovascular: Negative.   Gastrointestinal: Negative.   Endocrine: Negative.   Genitourinary: Negative.  Musculoskeletal: Negative.   Skin: Negative.   Allergic/Immunologic: Negative.   Neurological: Negative.   Hematological: Negative.   Psychiatric/Behavioral: Negative.    All other systems reviewed and are negative.      Objective:   Vitals:   01/07/23 1520  BP: 122/76  Pulse: 96  Resp: 18  Temp: 98.2 F (36.8 C)  TempSrc: Oral  SpO2: 96%  Weight: 229 lb 14.4 oz (104.3 kg)  Height: 5\' 3"  (1.6 m)    Body mass index is 40.72 kg/m.  Physical Exam Vitals and nursing note reviewed.  Constitutional:      General: She is not in acute distress.    Appearance: She is well-developed. She is obese. She is not ill-appearing, toxic-appearing or diaphoretic.  HENT:     Head: Normocephalic and atraumatic.     Nose: Nose normal.  Eyes:     General:        Right eye: No discharge.        Left eye: No discharge.     Conjunctiva/sclera: Conjunctivae normal.  Neck:     Trachea: No tracheal deviation.  Cardiovascular:     Rate and Rhythm: Normal rate and regular rhythm.     Pulses: Normal pulses.     Heart sounds: Normal heart sounds. No murmur heard.    No friction rub. No gallop.  Pulmonary:     Effort: Pulmonary effort is normal. No respiratory distress.     Breath sounds: Normal breath sounds. No stridor. No wheezing, rhonchi or rales.  Musculoskeletal:     Right lower leg: No edema.     Left lower leg: No edema.  Skin:    General: Skin is warm and dry.     Findings: No rash.  Neurological:     Mental Status: She is alert.     Motor: No abnormal muscle tone.     Coordination: Coordination normal.      Results  for orders placed or performed in visit on 11/15/22  POCT rapid strep A  Result Value Ref Range   Rapid Strep A Screen Positive (A) Negative   *Note: Due to a large number of results and/or encounters for the requested time period, some results have not been displayed. A complete set of results can be found in Results Review.       Assessment & Plan:   1. Near syncope Orthostatics reviewed and reassuring Pt is losing a lot of fluids with new constipation meds, may be causing some sx with positional changes, encouraged to change positions slowly, wait for sx to resolve before moving to avoid syncope - Orthostatic vital signs - Ambulatory referral to Neurology  2. Paroxysmal supraventricular tachycardia (HCC) We previously decreased med doses to see if it would avoid possible med SE but she noticed more palpitations Pulse Readings from Last 3 Encounters:  01/07/23 96  12/24/22 95  12/18/22 90  She wishes trying toprol XL dosing instead of metoprolol BID She reports recently only taking total 100 mg daily No bradycardia, so doubt med dose it too high - metoprolol succinate (TOPROL-XL) 100 MG 24 hr tablet; Take 1 tablet (100 mg total) by mouth daily. Take with or immediately following a meal.  Dispense: 90 tablet; Refill: 0  3. Migraine without aura and with status migrainosus, not intractable Various sx - need to est with neurology again, prior pt, lost to f/up due to insurance issues Right now PM&R is doing med management and refills for migraines - Ambulatory referral  to Neurology  4. Cervicalgia  - Ambulatory referral to Neurology  5. Cervico-occipital neuralgia  - Ambulatory referral to Neurology  6. Gait disturbance  - Ambulatory referral to Neurology  7. Vertigo  - Ambulatory referral to Neurology  8. Bilateral hand pain Request eval by specialists for pain tingling numbness in both hands - Ambulatory referral to Orthopedics  9. Skin lesion of back See  below - Ambulatory referral to Dermatology   -Needs hand/ortho for bilateral hand pain/she thinks carpal tunnel  -Dermatology for moles/lesions On back - itchy crusty   podiatrist foot under pad of toes left foot has pain   Danelle Berry, PA-C 01/07/23 3:39 PM

## 2023-01-07 NOTE — Telephone Encounter (Signed)
Increase in dose.  Auth Submission: APPROVED Site of care: Site of care: [facility] Payer: [place] medicaid [employer] blue Medication & CPT/J Code(s) submitted: Vyepti (Eptinezumab) [id number] Route of submission (phone, fax, portal):  Phone # [phone] Fax # Auth type: Pharmacy Benefit Units/visits requested: 300mg  q3 months Reference number: [id number] Approval from: 06/12/22 to 06/14/23   Test claim = paid claim/approved Test claim was run for 3 vials (300mg ) 1st verify: [place] Koller 2nd verify: Annie-L

## 2023-01-07 NOTE — Progress Notes (Signed)
IR consult cancelled and Ent consult/referral ordered -  To go with 11/15/2022, 12/09/2022 OV and thyroid US results from 12/20/2022   ICD-10-CM   1. Neck mass  R22.1 Ambulatory referral to ENT    CANCELED: Ambulatory referral to Interventional Radiology    CANCELED: [place] FNA BX THYROID 1ST LESION AFIRMA    2. Thyromegaly  E01.0 Ambulatory referral to ENT    CANCELED: Ambulatory referral to Interventional Radiology    CANCELED: [place] FNA BX THYROID 1ST LESION AFIRMA    3. Multinodular thyroid  E04.2 Ambulatory referral to ENT    CANCELED: Ambulatory referral to Interventional Radiology    CANCELED: [place] FNA BX THYROID 1ST LESION AFIRMA    4. Thyroid lesion  E07.9 Ambulatory referral to ENT    CANCELED: Ambulatory referral to Interventional Radiology    CANCELED: [place] FNA BX THYROID 1ST LESION AFIRMA   asymmetric enlargement of the right thyroid lobe secondary to a 3.3 cm cystic nodule.  referral to interventional radiology for image guided aspiration.    5. Dysphagia, unspecified type  R13.10 Ambulatory referral to ENT    CANCELED: Ambulatory referral to Interventional Radiology    CANCELED: [place] FNA BX THYROID 1ST LESION [place] Shawl      Danelle Berry, PA-C

## 2023-01-10 ENCOUNTER — Ambulatory Visit: Payer: Medicaid Other | Admitting: Orthopaedic Surgery

## 2023-01-10 DIAGNOSIS — R202 Paresthesia of skin: Secondary | ICD-10-CM

## 2023-01-10 NOTE — Progress Notes (Signed)
Office Visit Note   Patient: Dana Bradley           Date of Birth: August 11, 1971           MRN: [id number] Visit Date: 01/10/2023              Requested by: Danelle Berry, PA-C [address] [place],  [place] [address] PCP: Danelle Berry, PA-C   Assessment & Plan: Visit Diagnoses:  1. Paresthesia of hand, bilateral     Plan: Impression is bilateral hand paresthesias likely with a component of carpal tunnel syndrome.  At this point, I would like to order nerve conduction study of both upper extremities to assess for nerve compression.  She will follow-up with [place] once this is been completed.  Call with concerns or questions.  Follow-Up Instructions: Return for f/u after ncs.   Orders:  Orders Placed This Encounter  Procedures   Ambulatory referral to Physical Medicine Rehab   No orders of the defined types were placed in this encounter.     Procedures: No procedures performed   Clinical Data: No additional findings.   Subjective: Chief Complaint  Patient presents with   Right Hand - Pain   Left Hand - Pain    HPI patient is a pleasant 52 year old right-hand-dominant female who comes in today with bilateral hand pain and paresthesias right greater than left for the past several years.  The symptoms she has are primarily to the thumb, index and long fingers.  She tells me she does a lot of knitting, crocheting and bordering.  She has occasional electric shocks to the palm of her hand.  She has been dropping things and notices weakness at times.  Symptoms are worse at night where she often wakes up having to shake her hands.  She has tried night splinting without significant relief.  She does not take medication for pain.  She has been seen by neurologist prior to the pandemic where she underwent bilateral carpal tunnel injections without significant relief.  She thinks she had a nerve conduction study years ago as well.  Of note, she has a history of cervical spine  radiculopathy.  Review of Systems as detailed in HPI.  All others reviewed and are negative.   Objective: Vital Signs: LMP 10/22/2021 (Approximate)   Physical Exam well-developed well-nourished female no acute distress.  Alert and oriented x 3.  Ortho Exam bilateral hand exam: Right hand has a positive Phalen and positive Tinel at the elbow and wrist.  No thenar atrophy.  Slight decrease sensation to the medial nerve distribution.  Left hand exam is a positive  Tinel at the wrist and elbow.  Negative Phalen.  No thenar atrophy.  Full sensation.  She is neurovascularly intact distally.  Specialty Comments:  No specialty comments available.  Imaging: No new imaging   PMFS History: Patient Active Problem List   Diagnosis Date Noted   Abnormal CT of the abdomen 11/13/2022   Adenomatous polyp of colon 11/13/2022   Herniated nucleus pulposus, L3-4 (Right) 10/29/2022   Lumbar radiculopathy 10/21/2022   Dyspepsia 09/26/2022   Blood in stool 09/26/2022   Disease of spinal cord 04/04/2022   Neurogenic bladder 01/28/2022   Urinary and fecal incontinence 01/28/2022   New onset type 2 diabetes mellitus 01/02/2022   OSA (obstructive sleep apnea) 12/14/2021   Excessive daytime sleepiness 12/14/2021   Lateral epicondylitis, left elbow 07/19/2021   Abnormal MRI, cervical spine (02/14/2021) 02/28/2021   Cervicalgia 02/13/2021  Abnormal MRI, lumbar spine (12/27/2021) 11/09/2020   Chronic midline thoracic back pain 11/09/2020   Abnormal MRI, thoracic spine (08/02/2019) 11/09/2020   Thoracic spinal stenosis (T7-8) 11/09/2020   Prolapse of thoracic disc with radiculopathy (T7-8) 11/09/2020   Spasm of muscle of lower back 11/09/2020   Vertigo, benign paroxysmal, unspecified laterality 11/09/2020   Uncomplicated opioid dependence 11/08/2020   Hyperalgesia 03/07/2020   Neurogenic urinary incontinence 03/01/2020   Other intervertebral disc degeneration, lumbar region 03/01/2020   Spinal  stenosis of lumbosacral region 03/01/2020   Colon cancer screening 02/06/2020   Lumbar radiculitis (Right) 09/21/2019   Chronic lower extremity pain (Bilateral) 09/06/2019   Intractable migraine with aura without status migrainosus 08/10/2019   Other specified dorsopathies, sacral and sacrococcygeal region 08/03/2019   Latex precautions, history of latex allergy 08/03/2019   History of allergy to radiographic contrast media 08/03/2019   DDD (degenerative disc disease), cervical 07/21/2019   Cervical facet syndrome (Bilateral) (L>R) 07/21/2019   DDD (degenerative disc disease), thoracic 07/21/2019   Osteoarthritis of hip (Left) 07/21/2019   Chronic groin pain (Bilateral) (L>R) 07/21/2019   Chronic hip pain (Bilateral) (L>R) 07/21/2019   Somatic dysfunction of sacroiliac joint (Bilateral) 07/21/2019   Migraine with aura and with status migrainosus, not intractable 04/06/2019   Cervico-occipital neuralgia (Left) 04/06/2019   Weakness of leg (Left) 04/05/2019   Difficulty walking 04/05/2019   Chronic migraine without aura, with intractable migraine, so stated, with status migrainosus 12/27/2018   Malar rash 09/04/2018   Trigger point of neck (Left) 03/19/2018   Occipital headache 12/25/2017   Chronic fatigue syndrome with fibromyalgia 12/12/2017   Trigger point of shoulder region (Left) 11/17/2017   Myofascial pain syndrome (Left) (trapezius muscle) 07/22/2017   Lumbar L1-2 disc protrusion (Right) 04/07/2017   Muscle spasticity 04/01/2017   Osteoarthritis of shoulder (Bilateral) 04/01/2017   Lumbar spondylosis 01/06/2017   Chronic hip pain (Left) 12/24/2016   Chronic sacroiliac joint pain (Left) 12/24/2016   Lumbar facet syndrome (Bilateral) (L>R) 12/24/2016   Lumbar radiculitis (Left) 12/24/2016   Hypertriglyceridemia 11/27/2016   History of vasovagal episode 10/30/2016   Cervicogenic headache 09/09/2016   Medication monitoring encounter 08/29/2016   Controlled substance agreement  signed 08/28/2016   Plantar fasciitis of left foot 08/28/2016   Vitamin B12 deficiency 08/28/2016   Hyperlipidemia 08/28/2016   Nephrolithiasis 08/12/2016   Chronic pain syndrome 08/07/2016   Long term prescription opiate use 08/07/2016   Opiate use 08/07/2016   Long term prescription benzodiazepine use 08/07/2016   Neurogenic pain 08/07/2016   Chronic low back pain (1ry area of Pain) (Bilateral) (R>L) (midline) w/ sciatica (Bilateral) 08/07/2016   Chronic upper back pain (2ry area of Pain) (Bilateral) (L>R) 08/07/2016   Chronic abdominal pain (Right lower quadrant) 08/07/2016   Thoracic radiculitis (Bilateral: T10, T11) 08/07/2016   Chronic occipital neuralgia (3ry area of Pain) (Bilateral) (L>R) 08/07/2016   Chronic neck pain 08/07/2016   Chronic cervical radicular pain (Bilateral) (L>R) 08/07/2016   Chronic shoulder blade pain (Bilateral) (L>R) 08/07/2016   Chronic upper extremity pain (Bilateral) (R>L) 08/07/2016   Chronic knee pain (Bilateral) (R>L) 08/07/2016   Chronic ankle pain (Bilateral) 08/07/2016   Cervical spondylosis with myelopathy and radiculopathy 08/07/2016   Panic disorder with agoraphobia 05/29/2016   Depression, unspecified depression type 05/29/2016   Atypical lymphocytosis 05/01/2016   Vitamin D insufficiency 05/01/2016   Chronic lower extremity cramps (Bilateral) (R>L) 04/29/2016   Obesity, Class III, BMI 40-49.9 (morbid obesity) 04/29/2016   GAD (generalized anxiety disorder) 04/29/2016  Fatigue 04/29/2016   Insomnia 07/12/2015   Migraine without aura and with status migrainosus, not intractable 07/12/2015   Chronic superficial gastritis 06/02/2015   Chronic pain of multiple joints 05/15/2015   Bilateral leg edema 05/02/2015   Paroxysmal supraventricular tachycardia (HCC) 04/17/2015   Exertional shortness of breath 04/17/2015   Bright red rectal bleeding 04/06/2015   DDD (degenerative disc disease), lumbosacral 01/24/2014   DDD (degenerative disc  disease), lumbar 01/24/2014   Cervico-occipital neuralgia 12/29/2013   Fibromyalgia 12/29/2013   Migraine headache 12/29/2013   Menorrhagia 12/10/2012   Depression, major, recurrent, in remission (HCC) 01/12/2009   Chest pain 01/12/2009   Hypertension, benign essential, goal below 140/90 06/23/2008   History of PSVT (paroxysmal supraventricular tachycardia) 06/17/2008   Obstructive sleep apnea 06/17/2008   GERD 06/13/2008   Past Medical History:  Diagnosis Date   Acute postoperative pain 04/07/2017   Anxiety    Bursitis    Chronic fatigue 12/12/2017   Chronic fatigue syndrome    Colitis 2021   Diabetes mellitus without complication    Edema leg 05/02/2015   Fibromyalgia    GERD (gastroesophageal reflux disease)    IBS (irritable bowel syndrome)    Knee pain, bilateral 12/21/2008   Qualifier: Diagnosis of  By: Daphine Deutscher FNP, [place]     Lumbar discitis    Migraines    Osteoarthritis    Right hand pain 04/10/2015   [facility] has done nerve conduction studies and ruled out carpal tunnel.    Sleep apnea    Spinal stenosis    SVT (supraventricular tachycardia)    Vertigo    Vitamin D deficiency 05/01/2016    Family History  Problem Relation Age of Onset   Depression Mother    Hypertension Mother    Cancer Mother        Skin   Hyperlipidemia Mother    Anxiety disorder Mother    Migraines Mother    Alcohol abuse Father    Depression Father    Stroke Father    Heart disease Father    Hypertension Father    Anxiety disorder Father    Depression Sister    Hyperlipidemia Sister    Diabetes Sister    Hypertension Sister    Polycystic ovary syndrome Sister    Bipolar disorder Sister    Anxiety disorder Sister    Migraines Sister    Depression Sister    Hypertension Sister    Anxiety disorder Sister    Migraines Sister    Breast cancer Maternal Grandmother 17   Cancer Maternal Grandmother 104       Breast   Thyroid disease Maternal Grandmother    Arthritis  Maternal Grandmother    Hyperlipidemia Maternal Grandmother    Aneurysm Maternal Grandfather    Hypertension Maternal Grandfather    Heart disease Maternal Grandfather    Alzheimer's disease Paternal Grandmother    Heart attack Paternal Grandfather    Hypertension Paternal Grandfather    COPD Paternal Grandfather    Heart disease Paternal Grandfather    Migraines Daughter    Other Daughter        leomyoa scaroma   Migraines Daughter    Migraines Son    Alzheimer's disease Other    Bladder Cancer Neg Hx    Kidney cancer Neg Hx     Past Surgical History:  Procedure Laterality Date   ABLATION     Uterine   BREAST BIOPSY Left    2014 U/S bx fibroadenoma  CARDIAC CATHETERIZATION     with ablation   COLONOSCOPY N/A 09/26/2022   Procedure: COLONOSCOPY;  Surgeon: Wyline Mood, MD;  Location: [facility] ENDOSCOPY;  Service: Gastroenterology;  Laterality: N/A;   COLONOSCOPY WITH PROPOFOL N/A 05/17/2015   Procedure: COLONOSCOPY WITH PROPOFOL;  Surgeon: Scot Jun, MD;  Location: [facility] ENDOSCOPY;  Service: Endoscopy;  Laterality: N/A;   COLONOSCOPY WITH PROPOFOL N/A 03/20/2020   Procedure: COLONOSCOPY WITH PROPOFOL;  Surgeon: Wyline Mood, MD;  Location: [facility] ENDOSCOPY;  Service: Gastroenterology;  Laterality: N/A;   COLONOSCOPY WITH PROPOFOL N/A 09/25/2022   Procedure: COLONOSCOPY WITH PROPOFOL;  Surgeon: Wyline Mood, MD;  Location: [facility] ENDOSCOPY;  Service: Gastroenterology;  Laterality: N/A;   COLONOSCOPY WITH PROPOFOL N/A 10/02/2022   Procedure: COLONOSCOPY WITH PROPOFOL;  Surgeon: Wyline Mood, MD;  Location: [facility] ENDOSCOPY;  Service: Gastroenterology;  Laterality: N/A;   COLONOSCOPY WITH PROPOFOL N/A 11/13/2022   Procedure: COLONOSCOPY WITH PROPOFOL;  Surgeon: Wyline Mood, MD;  Location: [facility] ENDOSCOPY;  Service: Gastroenterology;  Laterality: N/A;   ESOPHAGOGASTRODUODENOSCOPY N/A 05/17/2015   Procedure: ESOPHAGOGASTRODUODENOSCOPY (EGD);  Surgeon: Scot Jun, MD;  Location: [facility]  ENDOSCOPY;  Service: Endoscopy;  Laterality: N/A;   ESOPHAGOGASTRODUODENOSCOPY N/A 09/25/2022   Procedure: ESOPHAGOGASTRODUODENOSCOPY (EGD);  Surgeon: Wyline Mood, MD;  Location: [facility] ENDOSCOPY;  Service: Gastroenterology;  Laterality: N/A;   ESOPHAGOGASTRODUODENOSCOPY N/A 09/26/2022   Procedure: ESOPHAGOGASTRODUODENOSCOPY (EGD);  Surgeon: Wyline Mood, MD;  Location: [facility] ENDOSCOPY;  Service: Gastroenterology;  Laterality: N/A;   KNEE ARTHROSCOPY     spg     6/18   Tibial Tubercle Bypass Right 1998   TUBAL LIGATION  10/01/1999   Social History   Occupational History   Occupation: disbled    Comment: not able  Tobacco Use   Smoking status: Former    Packs/day: 2.00    Years: 3.00    Additional pack years: 0.00    Total pack years: 6.00    Types: Cigarettes    Quit date: 12/10/1992    Years since quitting: 30.1   Smokeless tobacco: Never   Tobacco comments:    quit 25 years ago  Vaping Use   Vaping Use: Never used  Substance and Sexual Activity   Alcohol use: No    Comment: socially   Drug use: No   Sexual activity: Not Currently    Partners: Male    Birth control/protection: Surgical

## 2023-01-15 DIAGNOSIS — E041 Nontoxic single thyroid nodule: Secondary | ICD-10-CM | POA: Diagnosis not present

## 2023-01-15 DIAGNOSIS — E042 Nontoxic multinodular goiter: Secondary | ICD-10-CM | POA: Diagnosis not present

## 2023-01-15 DIAGNOSIS — R1314 Dysphagia, pharyngoesophageal phase: Secondary | ICD-10-CM | POA: Diagnosis not present

## 2023-01-15 DIAGNOSIS — K219 Gastro-esophageal reflux disease without esophagitis: Secondary | ICD-10-CM | POA: Diagnosis not present

## 2023-01-15 DIAGNOSIS — J301 Allergic rhinitis due to pollen: Secondary | ICD-10-CM | POA: Diagnosis not present

## 2023-01-17 ENCOUNTER — Ambulatory Visit: Payer: Medicaid Other | Admitting: Physical Medicine and Rehabilitation

## 2023-01-17 DIAGNOSIS — R531 Weakness: Secondary | ICD-10-CM

## 2023-01-17 DIAGNOSIS — G8929 Other chronic pain: Secondary | ICD-10-CM

## 2023-01-17 DIAGNOSIS — M25512 Pain in left shoulder: Secondary | ICD-10-CM | POA: Diagnosis not present

## 2023-01-17 DIAGNOSIS — M79642 Pain in left hand: Secondary | ICD-10-CM

## 2023-01-17 DIAGNOSIS — R202 Paresthesia of skin: Secondary | ICD-10-CM | POA: Diagnosis not present

## 2023-01-17 DIAGNOSIS — M79641 Pain in right hand: Secondary | ICD-10-CM

## 2023-01-17 DIAGNOSIS — M542 Cervicalgia: Secondary | ICD-10-CM | POA: Diagnosis not present

## 2023-01-17 NOTE — Progress Notes (Unsigned)
Functional Pain Scale - descriptive words and definitions  Distracting (5)    Aware of pain/able to complete some ADL's but limited by pain/sleep is affected and active distractions are only slightly useful. Moderate range order  Average Pain 4-5  Right handed. Numbness in both hands that can happen in all fingers on both hands, mainly in thumb, index and middle fingers. Hard to grasp things. Pain radiates up the left arm to the shoulder and the right arm to the forearm

## 2023-01-20 ENCOUNTER — Encounter: Payer: Self-pay | Admitting: Physical Medicine and Rehabilitation

## 2023-01-20 NOTE — Progress Notes (Signed)
Dana Bradley - 52 y.o. female MRN [id number]  Date of birth: 11-03-1970  Office Visit Note: Visit Date: 01/17/2023 PCP: Danelle Berry, PA-C Referred by: Tarry Kos, MD  Subjective: Chief Complaint  Patient presents with   Right Hand - Numbness, Pain, Weakness   Left Hand - Numbness, Pain, Weakness   HPI: Dana Bradley is a 52 y.o. female who comes in today for evaluation and management at the request of Dr. Glee Arvin for bilateral upper extremity pain with numbness in both hands globally as well as weakness, neck pain and shoulder pain.  She reports a chronic long history of multiple joint pain and paresthesia with complicated medical history of chronic pain syndrome, cervical radiculopathy and lumbar radiculopathy as well as fibromyalgia and irritable bowel syndrome and chronic fatigue syndrome.  She is diabetic.  Hemoglobin A1c in the mid 6 range.  Medical history complicated with 29 drug intolerances.  Her current complaints are worsening of bilateral upper extremity pain and paresthesia and tingling numbness and weakness.  It is in a global fashion in both hands with weakness in both hands.  She does a lot of crocheting and knitting and it does make her symptoms worse with activity.  Her symptoms are worse at night.  She has had night splinting as well as different modalities without much relief of her symptoms.  She has had no history of cervical surgery.  She may have had a prior electrodiagnostic study that we do not have that for review.  At some point she underwent carpal tunnel injections which were very beneficial to her many years ago.  She is followed by Dr. Adelene Amas at [facility] pain medicine and has had multiple spine injection including cervical.  Cervical MRI reviewed below showing very small central protrusions.   I spent more than 30 minutes speaking face-to-face with the patient with 50% of the time in counseling and discussing coordination of care.      Review of  Systems  Musculoskeletal:  Positive for back pain, joint pain and neck pain.  Neurological:  Positive for tingling and focal weakness.  All other systems reviewed and are negative.  Otherwise per HPI.  Assessment & Plan: Visit Diagnoses:    ICD-10-CM   1. Paresthesia of skin  R20.2 NCV with EMG (electromyography)    2. Bilateral hand pain  M79.641    M79.642     3. Cervicalgia  M54.2     4. Chronic left shoulder pain  M25.512    G89.29     5. Weakness  R53.1        Plan: Impression: Clinically complex pain complaints in the upper extremities with history of chronic pain syndrome and fibromyalgia but also with history of relief with carpal tunnel injection.  Electrodiagnostic study performed today.  Essentially NORMAL electrodiagnostic study of both upper limbs.  There is no significant electrodiagnostic evidence of nerve entrapment, brachial plexopathy, cervical radiculopathy or generalized peripheral neuropathy.    As you know, purely sensory or demyelinating radiculopathies and chemical radiculitis may not be detected with this particular electrodiagnostic study. **This electrodiagnostic study cannot rule out small fiber polyneuropathy and dysesthesias from central pain syndromes such as stroke or central pain sensitization syndromes such as fibromyalgia.  Myotomal referral pain from trigger points is also not excluded.  Recommendations: 1.  Follow-up with referring physician.  Could consider diagnostic injection and occupational hand therapy. 2.  Continue current management of symptoms.  Meds & Orders: No orders of the defined  types were placed in this encounter.   Orders Placed This Encounter  Procedures   NCV with EMG (electromyography)    Follow-up: Return for  Glee Arvin, MD.   Procedures: No procedures performed  EMG & NCV Findings: All nerve conduction studies (as indicated in the following tables) were within normal limits.  All left vs. right side differences were  within normal limits.    All examined muscles (as indicated in the following table) showed no evidence of electrical instability.    Impression: Essentially NORMAL electrodiagnostic study of both upper limbs.  There is no significant electrodiagnostic evidence of nerve entrapment, brachial plexopathy, cervical radiculopathy or generalized peripheral neuropathy.    As you know, purely sensory or demyelinating radiculopathies and chemical radiculitis may not be detected with this particular electrodiagnostic study. **This electrodiagnostic study cannot rule out small fiber polyneuropathy and dysesthesias from central pain syndromes such as stroke or central pain sensitization syndromes such as fibromyalgia.  Myotomal referral pain from trigger points is also not excluded.  Recommendations: 1.  Follow-up with referring physician.  Could consider diagnostic injection and occupational hand therapy. 2.  Continue current management of symptoms.  ___________________________ Naaman Plummer FAAPMR Board Certified, American Board of Physical Medicine and Rehabilitation    Nerve Conduction Studies Anti Sensory Summary Table   Stim Site NR Peak (ms) Norm Peak (ms) P-T Amp (V) Norm P-T Amp Site1 Site2 Delta-P (ms) Dist (cm) Vel (m/s) Norm Vel (m/s)  Left Median Acr Palm Anti Sensory (2nd Digit)  31C  Wrist    2.9 <3.6 40.1 >10 Wrist Palm 1.3 0.0    Palm    1.6 <2.0 21.6         Right Median Acr Palm Anti Sensory (2nd Digit)  30.5C  Wrist    3.2 <3.6 31.5 >10 Wrist Palm 1.2 0.0    Palm    2.0 <2.0 1.7         Left Radial Anti Sensory (Base 1st Digit)  31.5C  Wrist    1.9 <3.1 38.3  Wrist Base 1st Digit 1.9 0.0    Right Radial Anti Sensory (Base 1st Digit)  31.2C  Wrist    1.9 <3.1 43.2  Wrist Base 1st Digit 1.9 0.0    Left Ulnar Anti Sensory (5th Digit)  31.7C  Wrist    3.1 <3.7 27.5 >15.0 Wrist 5th Digit 3.1 14.0 45 >38  Right Ulnar Anti Sensory (5th Digit)  31.1C  Wrist    3.1 <3.7 20.9  >15.0 Wrist 5th Digit 3.1 14.0 45 >38   Motor Summary Table   Stim Site NR Onset (ms) Norm Onset (ms) O-P Amp (mV) Norm O-P Amp Site1 Site2 Delta-0 (ms) Dist (cm) Vel (m/s) Norm Vel (m/s)  Left Median Motor (Abd Poll Brev)  31.3C  Wrist    2.7 <4.2 9.5 >5 Elbow Wrist 3.6 20.5 57 >50  Elbow    6.3  6.0         Right Median Motor (Abd Poll Brev)  31.3C  Wrist    3.1 <4.2 7.5 >5 Elbow Wrist 3.9 20.0 51 >50  Elbow    7.0  5.5         Left Ulnar Motor (Abd Dig Min)  31.7C  Wrist    3.2 <4.2 4.3 >3 B Elbow Wrist 2.6 16.0 62 >53  B Elbow    5.8  6.2  A Elbow B Elbow 1.1 10.0 91 >53  A Elbow    6.9  6.8  Right Ulnar Motor (Abd Dig Min)  31.5C  Wrist    3.0 <4.2 5.6 >3 B Elbow Wrist 2.9 17.0 59 >53  B Elbow    5.9  5.3  A Elbow B Elbow 1.2 10.0 83 >53  A Elbow    7.1  5.1          EMG   Side Muscle Nerve Root Ins Act Fibs Psw Amp Dur Poly Recrt Int Dennie Bible Comment  Right Abd Poll Brev Median C8-T1 Nml Nml Nml Nml Nml 0 Nml Nml   Right 1stDorInt Ulnar C8-T1 Nml Nml Nml Nml Nml 0 Nml Nml   Right PronatorTeres Median C6-7 Nml Nml Nml Nml Nml 0 Nml Nml   Right Biceps Musculocut C5-6 Nml Nml Nml Nml Nml 0 Nml Nml   Right Deltoid Axillary C5-6 Nml Nml Nml Nml Nml 0 Nml Nml     Nerve Conduction Studies Anti Sensory Left/Right Comparison   Stim Site L Lat (ms) R Lat (ms) L-R Lat (ms) L Amp (V) R Amp (V) L-R Amp (%) Site1 Site2 L Vel (m/s) R Vel (m/s) L-R Vel (m/s)  Median Acr Palm Anti Sensory (2nd Digit)  31C  Wrist 2.9 3.2 0.3 40.1 31.5 21.4 Wrist Palm     Palm 1.6 2.0 0.4 21.6 1.7 92.1       Radial Anti Sensory (Base 1st Digit)  31.5C  Wrist 1.9 1.9 0.0 38.3 43.2 11.3 Wrist Base 1st Digit     Ulnar Anti Sensory (5th Digit)  31.7C  Wrist 3.1 3.1 0.0 27.5 20.9 24.0 Wrist 5th Digit 45 45 0   Motor Left/Right Comparison   Stim Site L Lat (ms) R Lat (ms) L-R Lat (ms) L Amp (mV) R Amp (mV) L-R Amp (%) Site1 Site2 L Vel (m/s) R Vel (m/s) L-R Vel (m/s)  Median Motor (Abd Poll Brev)   31.3C  Wrist 2.7 3.1 0.4 9.5 7.5 21.1 Elbow Wrist 57 51 6  Elbow 6.3 7.0 0.7 6.0 5.5 8.3       Ulnar Motor (Abd Dig Min)  31.7C  Wrist 3.2 3.0 0.2 4.3 5.6 23.2 B Elbow Wrist 62 59 3  B Elbow 5.8 5.9 0.1 6.2 5.3 14.5 A Elbow B Elbow 91 83 8  A Elbow 6.9 7.1 0.2 6.8 5.1 25.0          Waveforms:                      Clinical History: MRI CERVICAL SPINE WITHOUT CONTRAST   TECHNIQUE: Multiplanar, multisequence MR imaging of the cervical spine was performed. No intravenous contrast was administered.   COMPARISON:  MRI 07/09/2019   FINDINGS: Alignment: Physiologic.   Vertebrae: No fracture, evidence of discitis, or bone lesion.   Cord: Normal signal and morphology. No abnormal epidural fluid collections.   Posterior Fossa, vertebral arteries, paraspinal tissues: Negative.   Disc levels:   C2-C3: No significant disc protrusion, foraminal stenosis, or canal stenosis. Unchanged.   C3-C4: No significant disc protrusion, foraminal stenosis, or canal stenosis. Unchanged.   C4-C5: Shallow right paracentral disc protrusion. No foraminal or canal stenosis. Unchanged.   C5-C6: Right paracentral disc protrusion contacts and and results in slight impress upon the ventral cord. Mild canal stenosis. No foraminal stenosis. Unchanged.   C6-C7: Small left paracentral disc protrusion contacting the ventral cord resulting in borderline left-sided canal stenosis. No foraminal stenosis. Unchanged.   C7-T1: No significant disc protrusion, foraminal stenosis, or canal stenosis. Unchanged.  IMPRESSION: 1. No apparent complication status post cervical epidural steroid injection. 2. Unchanged small disc protrusions at the C4-5, C5-6, and C6-7 levels. 3. Mild canal stenosis at C5-6 and C6-7. No significant foraminal stenosis at any level.     Electronically Signed   By: Duanne Guess D.O.   On: 02/14/2021 10:50   She reports that she quit smoking about 30 years ago.  Her smoking use included cigarettes. She has a 6.00 pack-year smoking history. She has never used smokeless tobacco.  Recent Labs    04/17/22 1005  HGBA1C 6.6*    Objective:  VS:  HT:    WT:   BMI:     BP:   HR: bpm  TEMP: ( )  RESP:  Physical Exam Vitals and nursing note reviewed.  Constitutional:      General: She is not in acute distress.    Appearance: Normal appearance. She is well-developed. She is obese. She is not ill-appearing.  HENT:     Head: Normocephalic and atraumatic.  Eyes:     Conjunctiva/sclera: Conjunctivae normal.     Pupils: Pupils are equal, round, and reactive to light.  Cardiovascular:     Rate and Rhythm: Normal rate.     Pulses: Normal pulses.  Pulmonary:     Effort: Pulmonary effort is normal.  Musculoskeletal:        General: No swelling, tenderness or deformity.     Right lower leg: No edema.     Left lower leg: No edema.     Comments: Inspection reveals no atrophy of the bilateral APB or FDI or hand intrinsics. There is no swelling, color changes, allodynia or dystrophic changes. There is 5 out of 5 strength in the bilateral wrist extension, finger abduction and long finger flexion. There is intact sensation to light touch in all dermatomal and peripheral nerve distributions. There is a negative Tinel's test at the bilateral wrist and elbow. There is a negative Phalen's test bilaterally. There is a negative Hoffmann's test bilaterally.  Skin:    General: Skin is warm and dry.     Findings: No erythema or rash.  Neurological:     General: No focal deficit present.     Mental Status: She is alert and oriented to person, place, and time.     Sensory: No sensory deficit.     Motor: No weakness or abnormal muscle tone.     Coordination: Coordination normal.     Gait: Gait normal.  Psychiatric:        Mood and Affect: Mood normal.        Behavior: Behavior normal.     Ortho Exam  Imaging: No results found.  Past  Medical/Family/Surgical/Social History: Medications & Allergies reviewed per EMR, new medications updated. Patient Active Problem List   Diagnosis Date Noted   Abnormal CT of the abdomen 11/13/2022   Adenomatous polyp of colon 11/13/2022   Herniated nucleus pulposus, L3-4 (Right) 10/29/2022   Lumbar radiculopathy 10/21/2022   Dyspepsia 09/26/2022   Blood in stool 09/26/2022   Disease of spinal cord 04/04/2022   Neurogenic bladder 01/28/2022   Urinary and fecal incontinence 01/28/2022   New onset type 2 diabetes mellitus 01/02/2022   OSA (obstructive sleep apnea) 12/14/2021   Excessive daytime sleepiness 12/14/2021   Lateral epicondylitis, left elbow 07/19/2021   Abnormal MRI, cervical spine (02/14/2021) 02/28/2021   Cervicalgia 02/13/2021   Abnormal MRI, lumbar spine (12/27/2021) 11/09/2020   Chronic midline thoracic back pain 11/09/2020  Abnormal MRI, thoracic spine (08/02/2019) 11/09/2020   Thoracic spinal stenosis (T7-8) 11/09/2020   Prolapse of thoracic disc with radiculopathy (T7-8) 11/09/2020   Spasm of muscle of lower back 11/09/2020   Vertigo, benign paroxysmal, unspecified laterality 11/09/2020   Uncomplicated opioid dependence 11/08/2020   Hyperalgesia 03/07/2020   Neurogenic urinary incontinence 03/01/2020   Other intervertebral disc degeneration, lumbar region 03/01/2020   Spinal stenosis of lumbosacral region 03/01/2020   Colon cancer screening 02/06/2020   Lumbar radiculitis (Right) 09/21/2019   Chronic lower extremity pain (Bilateral) 09/06/2019   Intractable migraine with aura without status migrainosus 08/10/2019   Other specified dorsopathies, sacral and sacrococcygeal region 08/03/2019   Latex precautions, history of latex allergy 08/03/2019   History of allergy to radiographic contrast media 08/03/2019   DDD (degenerative disc disease), cervical 07/21/2019   Cervical facet syndrome (Bilateral) (L>R) 07/21/2019   DDD (degenerative disc disease), thoracic  07/21/2019   Osteoarthritis of hip (Left) 07/21/2019   Chronic groin pain (Bilateral) (L>R) 07/21/2019   Chronic hip pain (Bilateral) (L>R) 07/21/2019   Somatic dysfunction of sacroiliac joint (Bilateral) 07/21/2019   Migraine with aura and with status migrainosus, not intractable 04/06/2019   Cervico-occipital neuralgia (Left) 04/06/2019   Weakness of leg (Left) 04/05/2019   Difficulty walking 04/05/2019   Chronic migraine without aura, with intractable migraine, so stated, with status migrainosus 12/27/2018   Malar rash 09/04/2018   Trigger point of neck (Left) 03/19/2018   Occipital headache 12/25/2017   Chronic fatigue syndrome with fibromyalgia 12/12/2017   Trigger point of shoulder region (Left) 11/17/2017   Myofascial pain syndrome (Left) (trapezius muscle) 07/22/2017   Lumbar L1-2 disc protrusion (Right) 04/07/2017   Muscle spasticity 04/01/2017   Osteoarthritis of shoulder (Bilateral) 04/01/2017   Lumbar spondylosis 01/06/2017   Chronic hip pain (Left) 12/24/2016   Chronic sacroiliac joint pain (Left) 12/24/2016   Lumbar facet syndrome (Bilateral) (L>R) 12/24/2016   Lumbar radiculitis (Left) 12/24/2016   Hypertriglyceridemia 11/27/2016   History of vasovagal episode 10/30/2016   Cervicogenic headache 09/09/2016   Medication monitoring encounter 08/29/2016   Controlled substance agreement signed 08/28/2016   Plantar fasciitis of left foot 08/28/2016   Vitamin B12 deficiency 08/28/2016   Hyperlipidemia 08/28/2016   Nephrolithiasis 08/12/2016   Chronic pain syndrome 08/07/2016   Long term prescription opiate use 08/07/2016   Opiate use 08/07/2016   Long term prescription benzodiazepine use 08/07/2016   Neurogenic pain 08/07/2016   Chronic low back pain (1ry area of Pain) (Bilateral) (R>L) (midline) w/ sciatica (Bilateral) 08/07/2016   Chronic upper back pain (2ry area of Pain) (Bilateral) (L>R) 08/07/2016   Chronic abdominal pain (Right lower quadrant) 08/07/2016    Thoracic radiculitis (Bilateral: T10, T11) 08/07/2016   Chronic occipital neuralgia (3ry area of Pain) (Bilateral) (L>R) 08/07/2016   Chronic neck pain 08/07/2016   Chronic cervical radicular pain (Bilateral) (L>R) 08/07/2016   Chronic shoulder blade pain (Bilateral) (L>R) 08/07/2016   Chronic upper extremity pain (Bilateral) (R>L) 08/07/2016   Chronic knee pain (Bilateral) (R>L) 08/07/2016   Chronic ankle pain (Bilateral) 08/07/2016   Cervical spondylosis with myelopathy and radiculopathy 08/07/2016   Panic disorder with agoraphobia 05/29/2016   Depression, unspecified depression type 05/29/2016   Atypical lymphocytosis 05/01/2016   Vitamin D insufficiency 05/01/2016   Chronic lower extremity cramps (Bilateral) (R>L) 04/29/2016   Obesity, Class III, BMI 40-49.9 (morbid obesity) 04/29/2016   GAD (generalized anxiety disorder) 04/29/2016   Fatigue 04/29/2016   Insomnia 07/12/2015   Migraine without aura and with status migrainosus,  not intractable 07/12/2015   Chronic superficial gastritis 06/02/2015   Chronic pain of multiple joints 05/15/2015   Bilateral leg edema 05/02/2015   Paroxysmal supraventricular tachycardia (HCC) 04/17/2015   Exertional shortness of breath 04/17/2015   Bright red rectal bleeding 04/06/2015   DDD (degenerative disc disease), lumbosacral 01/24/2014   DDD (degenerative disc disease), lumbar 01/24/2014   Cervico-occipital neuralgia 12/29/2013   Fibromyalgia 12/29/2013   Migraine headache 12/29/2013   Menorrhagia 12/10/2012   Depression, major, recurrent, in remission (HCC) 01/12/2009   Chest pain 01/12/2009   Hypertension, benign essential, goal below 140/90 06/23/2008   History of PSVT (paroxysmal supraventricular tachycardia) 06/17/2008   Obstructive sleep apnea 06/17/2008   GERD 06/13/2008   Past Medical History:  Diagnosis Date   Acute postoperative pain 04/07/2017   Anxiety    Bursitis    Chronic fatigue 12/12/2017   Chronic fatigue syndrome     Colitis 2021   Diabetes mellitus without complication    Edema leg 05/02/2015   Fibromyalgia    GERD (gastroesophageal reflux disease)    IBS (irritable bowel syndrome)    Knee pain, bilateral 12/21/2008   Qualifier: Diagnosis of  By: Daphine Deutscher FNP, [place]     Lumbar discitis    Migraines    Osteoarthritis    Right hand pain 04/10/2015   [facility] has done nerve conduction studies and ruled out carpal tunnel.    Sleep apnea    Spinal stenosis    SVT (supraventricular tachycardia)    Vertigo    Vitamin D deficiency 05/01/2016   Family History  Problem Relation Age of Onset   Depression Mother    Hypertension Mother    Cancer Mother        Skin   Hyperlipidemia Mother    Anxiety disorder Mother    Migraines Mother    Alcohol abuse Father    Depression Father    Stroke Father    Heart disease Father    Hypertension Father    Anxiety disorder Father    Depression Sister    Hyperlipidemia Sister    Diabetes Sister    Hypertension Sister    Polycystic ovary syndrome Sister    Bipolar disorder Sister    Anxiety disorder Sister    Migraines Sister    Depression Sister    Hypertension Sister    Anxiety disorder Sister    Migraines Sister    Breast cancer Maternal Grandmother 35   Cancer Maternal Grandmother 13       Breast   Thyroid disease Maternal Grandmother    Arthritis Maternal Grandmother    Hyperlipidemia Maternal Grandmother    Aneurysm Maternal Grandfather    Hypertension Maternal Grandfather    Heart disease Maternal Grandfather    Alzheimer's disease Paternal Grandmother    Heart attack Paternal Grandfather    Hypertension Paternal Grandfather    COPD Paternal Grandfather    Heart disease Paternal Grandfather    Migraines Daughter    Other Daughter        leomyoa scaroma   Migraines Daughter    Migraines Son    Alzheimer's disease Other    Bladder Cancer Neg Hx    Kidney cancer Neg Hx    Past Surgical History:  Procedure Laterality Date    ABLATION     Uterine   BREAST BIOPSY Left    2014 U/S bx fibroadenoma   CARDIAC CATHETERIZATION     with ablation   COLONOSCOPY N/A 09/26/2022   Procedure: COLONOSCOPY;  Surgeon: Wyline Mood, MD;  Location[facility] ENDOSCOPY;  Service: Gastroenterology;  Laterality: N/A;   COLONOSCOPY WITH PROPOFOL N/A 05/17/2015   Procedure: COLONOSCOPY WITH PROPOFOL;  Surgeon: Scot Jun, MD;  Location: [facility] ENDOSCOPY;  Service: Endoscopy;  Laterality: N/A;   COLONOSCOPY WITH PROPOFOL N/A 03/20/2020   Procedure: COLONOSCOPY WITH PROPOFOL;  Surgeon: Wyline Mood, MD;  Location: [facility] ENDOSCOPY;  Service: Gastroenterology;  Laterality: N/A;   COLONOSCOPY WITH PROPOFOL N/A 09/25/2022   Procedure: COLONOSCOPY WITH PROPOFOL;  Surgeon: Wyline Mood, MD;  Location: [facility] ENDOSCOPY;  Service: Gastroenterology;  Laterality: N/A;   COLONOSCOPY WITH PROPOFOL N/A 10/02/2022   Procedure: COLONOSCOPY WITH PROPOFOL;  Surgeon: Wyline Mood, MD;  Location: [facility] ENDOSCOPY;  Service: Gastroenterology;  Laterality: N/A;   COLONOSCOPY WITH PROPOFOL N/A 11/13/2022   Procedure: COLONOSCOPY WITH PROPOFOL;  Surgeon: Wyline Mood, MD;  Location: [facility] ENDOSCOPY;  Service: Gastroenterology;  Laterality: N/A;   ESOPHAGOGASTRODUODENOSCOPY N/A 05/17/2015   Procedure: ESOPHAGOGASTRODUODENOSCOPY (EGD);  Surgeon: Scot Jun, MD;  Location: [facility] ENDOSCOPY;  Service: Endoscopy;  Laterality: N/A;   ESOPHAGOGASTRODUODENOSCOPY N/A 09/25/2022   Procedure: ESOPHAGOGASTRODUODENOSCOPY (EGD);  Surgeon: Wyline Mood, MD;  Location: [facility] ENDOSCOPY;  Service: Gastroenterology;  Laterality: N/A;   ESOPHAGOGASTRODUODENOSCOPY N/A 09/26/2022   Procedure: ESOPHAGOGASTRODUODENOSCOPY (EGD);  Surgeon: Wyline Mood, MD;  Location: [facility] ENDOSCOPY;  Service: Gastroenterology;  Laterality: N/A;   KNEE ARTHROSCOPY     spg     6/18   Tibial Tubercle Bypass Right 1998   TUBAL LIGATION  10/01/1999   Social History   Occupational History   Occupation: disbled     Comment: not able  Tobacco Use   Smoking status: Former    Packs/day: 2.00    Years: 3.00    Additional pack years: 0.00    Total pack years: 6.00    Types: Cigarettes    Quit date: 12/10/1992    Years since quitting: 30.1   Smokeless tobacco: Never   Tobacco comments:    quit 25 years ago  Vaping Use   Vaping Use: Never used  Substance and Sexual Activity   Alcohol use: No    Comment: socially   Drug use: No   Sexual activity: Not Currently    Partners: Male    Birth control/protection: Surgical

## 2023-01-20 NOTE — Procedures (Signed)
EMG & NCV Findings: All nerve conduction studies (as indicated in the following tables) were within normal limits.  All left vs. right side differences were within normal limits.    All examined muscles (as indicated in the following table) showed no evidence of electrical instability.    Impression: Essentially NORMAL electrodiagnostic study of both upper limbs.  There is no significant electrodiagnostic evidence of nerve entrapment, brachial plexopathy, cervical radiculopathy or generalized peripheral neuropathy.    As you know, purely sensory or demyelinating radiculopathies and chemical radiculitis may not be detected with this particular electrodiagnostic study. **This electrodiagnostic study cannot rule out small fiber polyneuropathy and dysesthesias from central pain syndromes such as stroke or central pain sensitization syndromes such as fibromyalgia.  Myotomal referral pain from trigger points is also not excluded.  Recommendations: 1.  Follow-up with referring physician.  Could consider diagnostic injection and occupational hand therapy. 2.  Continue current management of symptoms.  ___________________________ Naaman Plummer FAAPMR Board Certified, American Board of Physical Medicine and Rehabilitation    Nerve Conduction Studies Anti Sensory Summary Table   Stim Site NR Peak (ms) Norm Peak (ms) P-T Amp (V) Norm P-T Amp Site1 Site2 Delta-P (ms) Dist (cm) Vel (m/s) Norm Vel (m/s)  Left Median Acr Palm Anti Sensory (2nd Digit)  31C  Wrist    2.9 <3.6 40.1 >10 Wrist Palm 1.3 0.0    Palm    1.6 <2.0 21.6         Right Median Acr Palm Anti Sensory (2nd Digit)  30.5C  Wrist    3.2 <3.6 31.5 >10 Wrist Palm 1.2 0.0    Palm    2.0 <2.0 1.7         Left Radial Anti Sensory (Base 1st Digit)  31.5C  Wrist    1.9 <3.1 38.3  Wrist Base 1st Digit 1.9 0.0    Right Radial Anti Sensory (Base 1st Digit)  31.2C  Wrist    1.9 <3.1 43.2  Wrist Base 1st Digit 1.9 0.0    Left Ulnar Anti Sensory  (5th Digit)  31.7C  Wrist    3.1 <3.7 27.5 >15.0 Wrist 5th Digit 3.1 14.0 45 >38  Right Ulnar Anti Sensory (5th Digit)  31.1C  Wrist    3.1 <3.7 20.9 >15.0 Wrist 5th Digit 3.1 14.0 45 >38   Motor Summary Table   Stim Site NR Onset (ms) Norm Onset (ms) O-P Amp (mV) Norm O-P Amp Site1 Site2 Delta-0 (ms) Dist (cm) Vel (m/s) Norm Vel (m/s)  Left Median Motor (Abd Poll Brev)  31.3C  Wrist    2.7 <4.2 9.5 >5 Elbow Wrist 3.6 20.5 57 >50  Elbow    6.3  6.0         Right Median Motor (Abd Poll Brev)  31.3C  Wrist    3.1 <4.2 7.5 >5 Elbow Wrist 3.9 20.0 51 >50  Elbow    7.0  5.5         Left Ulnar Motor (Abd Dig Min)  31.7C  Wrist    3.2 <4.2 4.3 >3 B Elbow Wrist 2.6 16.0 62 >53  B Elbow    5.8  6.2  A Elbow B Elbow 1.1 10.0 91 >53  A Elbow    6.9  6.8         Right Ulnar Motor (Abd Dig Min)  31.5C  Wrist    3.0 <4.2 5.6 >3 B Elbow Wrist 2.9 17.0 59 >53  B Elbow    5.9  5.3  A Elbow B Elbow 1.2 10.0 83 >53  A Elbow    7.1  5.1          EMG   Side Muscle Nerve Root Ins Act Fibs Psw Amp Dur Poly Recrt Int Dennie Bible Comment  Right Abd Poll Brev Median C8-T1 Nml Nml Nml Nml Nml 0 Nml Nml   Right 1stDorInt Ulnar C8-T1 Nml Nml Nml Nml Nml 0 Nml Nml   Right PronatorTeres Median C6-7 Nml Nml Nml Nml Nml 0 Nml Nml   Right Biceps Musculocut C5-6 Nml Nml Nml Nml Nml 0 Nml Nml   Right Deltoid Axillary C5-6 Nml Nml Nml Nml Nml 0 Nml Nml     Nerve Conduction Studies Anti Sensory Left/Right Comparison   Stim Site L Lat (ms) R Lat (ms) L-R Lat (ms) L Amp (V) R Amp (V) L-R Amp (%) Site1 Site2 L Vel (m/s) R Vel (m/s) L-R Vel (m/s)  Median Acr Palm Anti Sensory (2nd Digit)  31C  Wrist 2.9 3.2 0.3 40.1 31.5 21.4 Wrist Palm     Palm 1.6 2.0 0.4 21.6 1.7 92.1       Radial Anti Sensory (Base 1st Digit)  31.5C  Wrist 1.9 1.9 0.0 38.3 43.2 11.3 Wrist Base 1st Digit     Ulnar Anti Sensory (5th Digit)  31.7C  Wrist 3.1 3.1 0.0 27.5 20.9 24.0 Wrist 5th Digit 45 45 0   Motor Left/Right Comparison   Stim  Site L Lat (ms) R Lat (ms) L-R Lat (ms) L Amp (mV) R Amp (mV) L-R Amp (%) Site1 Site2 L Vel (m/s) R Vel (m/s) L-R Vel (m/s)  Median Motor (Abd Poll Brev)  31.3C  Wrist 2.7 3.1 0.4 9.5 7.5 21.1 Elbow Wrist 57 51 6  Elbow 6.3 7.0 0.7 6.0 5.5 8.3       Ulnar Motor (Abd Dig Min)  31.7C  Wrist 3.2 3.0 0.2 4.3 5.6 23.2 B Elbow Wrist 62 59 3  B Elbow 5.8 5.9 0.1 6.2 5.3 14.5 A Elbow B Elbow 91 83 8  A Elbow 6.9 7.1 0.2 6.8 5.1 25.0          Waveforms:

## 2023-01-22 ENCOUNTER — Other Ambulatory Visit: Payer: Self-pay | Admitting: Otolaryngology

## 2023-01-22 DIAGNOSIS — E041 Nontoxic single thyroid nodule: Secondary | ICD-10-CM

## 2023-01-22 NOTE — Progress Notes (Signed)
Pernell Dupre, MD sent to [facility],  Approved for US guided aspiration of right thyroid cyst. Should be ok for local only.  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

## 2023-01-28 ENCOUNTER — Ambulatory Visit
Admission: RE | Admit: 2023-01-28 | Discharge: 2023-01-28 | Disposition: A | Discharge status: Home / Self Care | Payer: Medicaid Other | Source: Ambulatory Visit | Attending: Otolaryngology | Admitting: Otolaryngology

## 2023-01-28 ENCOUNTER — Other Ambulatory Visit: Payer: Self-pay | Admitting: Physical Medicine and Rehabilitation

## 2023-01-28 DIAGNOSIS — E041 Nontoxic single thyroid nodule: Secondary | ICD-10-CM | POA: Insufficient documentation

## 2023-01-30 ENCOUNTER — Encounter: Payer: Self-pay | Admitting: Orthopaedic Surgery

## 2023-01-30 ENCOUNTER — Ambulatory Visit: Payer: Medicaid Other | Admitting: Orthopaedic Surgery

## 2023-01-30 DIAGNOSIS — R202 Paresthesia of skin: Secondary | ICD-10-CM | POA: Diagnosis not present

## 2023-01-30 LAB — CYTOLOGY - NON PAP

## 2023-01-30 NOTE — Progress Notes (Signed)
Office Visit Note   Patient: Dana Bradley           Date of Birth: January 10, 1971           MRN: [id number] Visit Date: 01/30/2023              Requested by: Danelle Berry, PA-C [address] [place],  [place] [address] PCP: Danelle Berry, PA-C   Assessment & Plan: Visit Diagnoses:  1. Paresthesia of skin     Plan: Nerve conduction studies were negative for carpal tunnel syndrome.  At this point I recommend that she follow-up with her neurologist to discuss potential etiologies for her symptoms and treatment options.  Follow-up with me as needed.  Follow-Up Instructions: No follow-ups on file.   Orders:  No orders of the defined types were placed in this encounter.  No orders of the defined types were placed in this encounter.     Procedures: No procedures performed   Clinical Data: No additional findings.   Subjective: Chief Complaint  Patient presents with   Right Hand - Follow-up    EMG review   Left Hand - Follow-up    HPI Patient returns today to discuss nerve conduction studies.  Reports no changes in her symptoms. Review of Systems  Constitutional: Negative.   HENT: Negative.    Eyes: Negative.   Respiratory: Negative.    Cardiovascular: Negative.   Endocrine: Negative.   Musculoskeletal: Negative.   Neurological: Negative.   Hematological: Negative.   Psychiatric/Behavioral: Negative.    All other systems reviewed and are negative.    Objective: Vital Signs: LMP 10/22/2021 (Approximate)   Physical Exam Vitals and nursing note reviewed.  Constitutional:      Appearance: She is well-developed.  HENT:     Head: Normocephalic and atraumatic.  Pulmonary:     Effort: Pulmonary effort is normal.  Abdominal:     Palpations: Abdomen is soft.  Musculoskeletal:     Cervical back: Neck supple.  Skin:    General: Skin is warm.     Capillary Refill: Capillary refill takes less than 2 seconds.  Neurological:     Mental Status: She is alert  and oriented to person, place, and time.  Psychiatric:        Behavior: Behavior normal.        Thought Content: Thought content normal.        Judgment: Judgment normal.     Ortho Exam Examination of both hands is unchanged. Specialty Comments:  MRI CERVICAL SPINE WITHOUT CONTRAST   TECHNIQUE: Multiplanar, multisequence MR imaging of the cervical spine was performed. No intravenous contrast was administered.   COMPARISON:  MRI 07/09/2019   FINDINGS: Alignment: Physiologic.   Vertebrae: No fracture, evidence of discitis, or bone lesion.   Cord: Normal signal and morphology. No abnormal epidural fluid collections.   Posterior Fossa, vertebral arteries, paraspinal tissues: Negative.   Disc levels:   C2-C3: No significant disc protrusion, foraminal stenosis, or canal stenosis. Unchanged.   C3-C4: No significant disc protrusion, foraminal stenosis, or canal stenosis. Unchanged.   C4-C5: Shallow right paracentral disc protrusion. No foraminal or canal stenosis. Unchanged.   C5-C6: Right paracentral disc protrusion contacts and and results in slight impress upon the ventral cord. Mild canal stenosis. No foraminal stenosis. Unchanged.   C6-C7: Small left paracentral disc protrusion contacting the ventral cord resulting in borderline left-sided canal stenosis. No foraminal stenosis. Unchanged.   C7-T1: No significant disc protrusion, foraminal stenosis, or canal stenosis.  Unchanged.   IMPRESSION: 1. No apparent complication status post cervical epidural steroid injection. 2. Unchanged small disc protrusions at the C4-5, C5-6, and C6-7 levels. 3. Mild canal stenosis at C5-6 and C6-7. No significant foraminal stenosis at any level.     Electronically Signed   By: Duanne Guess D.O.   On: 02/14/2021 10:50  Imaging: No results found.   PMFS History: Patient Active Problem List   Diagnosis Date Noted   Abnormal CT of the abdomen 11/13/2022   Adenomatous  polyp of colon 11/13/2022   Herniated nucleus pulposus, L3-4 (Right) 10/29/2022   Lumbar radiculopathy 10/21/2022   Dyspepsia 09/26/2022   Blood in stool 09/26/2022   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HCC) 04/04/2022   Neurogenic bladder 01/28/2022   Urinary and fecal incontinence 01/28/2022   New onset type 2 diabetes mellitus (HCC) 01/02/2022   OSA (obstructive sleep apnea) 12/14/2021   Excessive daytime sleepiness 12/14/2021   Lateral epicondylitis, left elbow 07/19/2021   Abnormal MRI, cervical spine (02/14/2021) 02/28/2021   Cervicalgia 02/13/2021   Abnormal MRI, lumbar spine (12/27/2021) 11/09/2020   Chronic midline thoracic back pain 11/09/2020   Abnormal MRI, thoracic spine (08/02/2019) 11/09/2020   Thoracic spinal stenosis (T7-8) 11/09/2020   Prolapse of thoracic disc with radiculopathy (T7-8) 11/09/2020   Spasm of muscle of lower back 11/09/2020   Vertigo, benign paroxysmal, unspecified laterality 11/09/2020   Uncomplicated opioid dependence (HCC) 11/08/2020   Hyperalgesia 03/07/2020   Neurogenic urinary incontinence 03/01/2020   Other intervertebral disc degeneration, lumbar region 03/01/2020   Spinal stenosis of lumbosacral region 03/01/2020   Colon cancer screening 02/06/2020   Lumbar radiculitis (Right) 09/21/2019   Chronic lower extremity pain (Bilateral) 09/06/2019   Intractable migraine with aura without status migrainosus 08/10/2019   Other specified dorsopathies, sacral and sacrococcygeal region 08/03/2019   Latex precautions, history of latex allergy 08/03/2019   History of allergy to radiographic contrast media 08/03/2019   DDD (degenerative disc disease), cervical 07/21/2019   Cervical facet syndrome (Bilateral) (L>R) 07/21/2019   DDD (degenerative disc disease), thoracic 07/21/2019   Osteoarthritis of hip (Left) 07/21/2019   Chronic groin pain (Bilateral) (L>R) 07/21/2019   Chronic hip pain (Bilateral) (L>R) 07/21/2019   Somatic dysfunction of sacroiliac joint  (Bilateral) 07/21/2019   Migraine with aura and with status migrainosus, not intractable 04/06/2019   Cervico-occipital neuralgia (Left) 04/06/2019   Weakness of leg (Left) 04/05/2019   Difficulty walking 04/05/2019   Chronic migraine without aura, with intractable migraine, so stated, with status migrainosus 12/27/2018   Malar rash 09/04/2018   Trigger point of neck (Left) 03/19/2018   Occipital headache 12/25/2017   Chronic fatigue syndrome with fibromyalgia 12/12/2017   Trigger point of shoulder region (Left) 11/17/2017   Myofascial pain syndrome (Left) (trapezius muscle) 07/22/2017   Lumbar L1-2 disc protrusion (Right) 04/07/2017   Muscle spasticity 04/01/2017   Osteoarthritis of shoulder (Bilateral) 04/01/2017   Lumbar spondylosis 01/06/2017   Chronic hip pain (Left) 12/24/2016   Chronic sacroiliac joint pain (Left) 12/24/2016   Lumbar facet syndrome (Bilateral) (L>R) 12/24/2016   Lumbar radiculitis (Left) 12/24/2016   Hypertriglyceridemia 11/27/2016   History of vasovagal episode 10/30/2016   Cervicogenic headache 09/09/2016   Medication monitoring encounter 08/29/2016   Controlled substance agreement signed 08/28/2016   Plantar fasciitis of left foot 08/28/2016   Vitamin B12 deficiency 08/28/2016   Hyperlipidemia 08/28/2016   Nephrolithiasis 08/12/2016   Chronic pain syndrome 08/07/2016   Long term prescription opiate use 08/07/2016   Opiate use 08/07/2016  Long term prescription benzodiazepine use 08/07/2016   Neurogenic pain 08/07/2016   Chronic low back pain (1ry area of Pain) (Bilateral) (R>L) (midline) w/ sciatica (Bilateral) 08/07/2016   Chronic upper back pain (2ry area of Pain) (Bilateral) (L>R) 08/07/2016   Chronic abdominal pain (Right lower quadrant) 08/07/2016   Thoracic radiculitis (Bilateral: T10, T11) 08/07/2016   Chronic occipital neuralgia (3ry area of Pain) (Bilateral) (L>R) 08/07/2016   Chronic neck pain 08/07/2016   Chronic cervical radicular pain  (Bilateral) (L>R) 08/07/2016   Chronic shoulder blade pain (Bilateral) (L>R) 08/07/2016   Chronic upper extremity pain (Bilateral) (R>L) 08/07/2016   Chronic knee pain (Bilateral) (R>L) 08/07/2016   Chronic ankle pain (Bilateral) 08/07/2016   Cervical spondylosis with myelopathy and radiculopathy 08/07/2016   Panic disorder with agoraphobia 05/29/2016   Depression, unspecified depression type 05/29/2016   Atypical lymphocytosis 05/01/2016   Vitamin D insufficiency 05/01/2016   Chronic lower extremity cramps (Bilateral) (R>L) 04/29/2016   Obesity, Class III, BMI 40-49.9 (morbid obesity) (HCC) 04/29/2016   GAD (generalized anxiety disorder) 04/29/2016   Fatigue 04/29/2016   Insomnia 07/12/2015   Migraine without aura and with status migrainosus, not intractable 07/12/2015   Chronic superficial gastritis 06/02/2015   Chronic pain of multiple joints 05/15/2015   Bilateral leg edema 05/02/2015   Paroxysmal supraventricular tachycardia (HCC) 04/17/2015   Exertional shortness of breath 04/17/2015   Bright red rectal bleeding 04/06/2015   DDD (degenerative disc disease), lumbosacral 01/24/2014   DDD (degenerative disc disease), lumbar 01/24/2014   Cervico-occipital neuralgia 12/29/2013   Fibromyalgia 12/29/2013   Migraine headache 12/29/2013   Menorrhagia 12/10/2012   Depression, major, recurrent, in remission (HCC) 01/12/2009   Chest pain 01/12/2009   Hypertension, benign essential, goal below 140/90 06/23/2008   History of PSVT (paroxysmal supraventricular tachycardia) 06/17/2008   Obstructive sleep apnea 06/17/2008   GERD 06/13/2008   Past Medical History:  Diagnosis Date   Acute postoperative pain 04/07/2017   Anxiety    Bursitis    Chronic fatigue 12/12/2017   Chronic fatigue syndrome    Colitis 2021   Diabetes mellitus without complication (HCC)    Edema leg 05/02/2015   Fibromyalgia    GERD (gastroesophageal reflux disease)    IBS (irritable bowel syndrome)    Knee  pain, bilateral 12/21/2008   Qualifier: Diagnosis of  By: Daphine Deutscher FNP, [place]     Lumbar discitis    Migraines    Osteoarthritis    Right hand pain 04/10/2015   [facility] has done nerve conduction studies and ruled out carpal tunnel.    Sleep apnea    Spinal stenosis    SVT (supraventricular tachycardia)    Vertigo    Vitamin D deficiency 05/01/2016    Family History  Problem Relation Age of Onset   Depression Mother    Hypertension Mother    Cancer Mother        Skin   Hyperlipidemia Mother    Anxiety disorder Mother    Migraines Mother    Alcohol abuse Father    Depression Father    Stroke Father    Heart disease Father    Hypertension Father    Anxiety disorder Father    Depression Sister    Hyperlipidemia Sister    Diabetes Sister    Hypertension Sister    Polycystic ovary syndrome Sister    Bipolar disorder Sister    Anxiety disorder Sister    Migraines Sister    Depression Sister    Hypertension Sister  Anxiety disorder Sister    Migraines Sister    Breast cancer Maternal Grandmother 63   Cancer Maternal Grandmother 67       Breast   Thyroid disease Maternal Grandmother    Arthritis Maternal Grandmother    Hyperlipidemia Maternal Grandmother    Aneurysm Maternal Grandfather    Hypertension Maternal Grandfather    Heart disease Maternal Grandfather    Alzheimer's disease Paternal Grandmother    Heart attack Paternal Grandfather    Hypertension Paternal Grandfather    COPD Paternal Grandfather    Heart disease Paternal Grandfather    Migraines Daughter    Other Daughter        leomyoa scaroma   Migraines Daughter    Migraines Son    Alzheimer's disease Other    Bladder Cancer Neg Hx    Kidney cancer Neg Hx     Past Surgical History:  Procedure Laterality Date   ABLATION     Uterine   BREAST BIOPSY Left    2014 U/S bx fibroadenoma   CARDIAC CATHETERIZATION     with ablation   COLONOSCOPY N/A 09/26/2022   Procedure: COLONOSCOPY;   Surgeon: Wyline Mood, MD;  Location: [facility] ENDOSCOPY;  Service: Gastroenterology;  Laterality: N/A;   COLONOSCOPY WITH PROPOFOL N/A 05/17/2015   Procedure: COLONOSCOPY WITH PROPOFOL;  Surgeon: Scot Jun, MD;  Location: [facility] ENDOSCOPY;  Service: Endoscopy;  Laterality: N/A;   COLONOSCOPY WITH PROPOFOL N/A 03/20/2020   Procedure: COLONOSCOPY WITH PROPOFOL;  Surgeon: Wyline Mood, MD;  Location: [facility] ENDOSCOPY;  Service: Gastroenterology;  Laterality: N/A;   COLONOSCOPY WITH PROPOFOL N/A 09/25/2022   Procedure: COLONOSCOPY WITH PROPOFOL;  Surgeon: Wyline Mood, MD;  Location: [facility] ENDOSCOPY;  Service: Gastroenterology;  Laterality: N/A;   COLONOSCOPY WITH PROPOFOL N/A 10/02/2022   Procedure: COLONOSCOPY WITH PROPOFOL;  Surgeon: Wyline Mood, MD;  Location: [facility] ENDOSCOPY;  Service: Gastroenterology;  Laterality: N/A;   COLONOSCOPY WITH PROPOFOL N/A 11/13/2022   Procedure: COLONOSCOPY WITH PROPOFOL;  Surgeon: Wyline Mood, MD;  Location: [facility] ENDOSCOPY;  Service: Gastroenterology;  Laterality: N/A;   ESOPHAGOGASTRODUODENOSCOPY N/A 05/17/2015   Procedure: ESOPHAGOGASTRODUODENOSCOPY (EGD);  Surgeon: Scot Jun, MD;  Location: [facility] ENDOSCOPY;  Service: Endoscopy;  Laterality: N/A;   ESOPHAGOGASTRODUODENOSCOPY N/A 09/25/2022   Procedure: ESOPHAGOGASTRODUODENOSCOPY (EGD);  Surgeon: Wyline Mood, MD;  Location: [facility] ENDOSCOPY;  Service: Gastroenterology;  Laterality: N/A;   ESOPHAGOGASTRODUODENOSCOPY N/A 09/26/2022   Procedure: ESOPHAGOGASTRODUODENOSCOPY (EGD);  Surgeon: Wyline Mood, MD;  Location: [facility] ENDOSCOPY;  Service: Gastroenterology;  Laterality: N/A;   KNEE ARTHROSCOPY     spg     6/18   Tibial Tubercle Bypass Right 1998   TUBAL LIGATION  10/01/1999   Social History   Occupational History   Occupation: disbled    Comment: not able  Tobacco Use   Smoking status: Former    Packs/day: 2.00    Years: 3.00    Additional pack years: 0.00    Total pack years: 6.00    Types: Cigarettes     Quit date: 12/10/1992    Years since quitting: 30.1   Smokeless tobacco: Never   Tobacco comments:    quit 25 years ago  Vaping Use   Vaping Use: Never used  Substance and Sexual Activity   Alcohol use: No    Comment: socially   Drug use: No   Sexual activity: Not Currently    Partners: Male    Birth control/protection: Surgical

## 2023-01-31 ENCOUNTER — Other Ambulatory Visit: Payer: Self-pay | Admitting: Physical Medicine and Rehabilitation

## 2023-01-31 MED ORDER — SERTRALINE HCL 50 MG PO TABS: 50.0000 mg | ORAL_TABLET | Freq: Every day | ORAL | 3 refills | Status: DC

## 2023-02-04 ENCOUNTER — Encounter: Payer: Self-pay | Admitting: Physical Medicine and Rehabilitation

## 2023-02-04 ENCOUNTER — Encounter: Payer: Medicaid Other | Attending: Psychology | Admitting: Physical Medicine and Rehabilitation

## 2023-02-04 VITALS — BP 116/76 | HR 98 | Ht 63.0 in | Wt 225.3 lb

## 2023-02-04 DIAGNOSIS — F329 Major depressive disorder, single episode, unspecified: Secondary | ICD-10-CM | POA: Diagnosis not present

## 2023-02-04 DIAGNOSIS — G43001 Migraine without aura, not intractable, with status migrainosus: Secondary | ICD-10-CM | POA: Diagnosis not present

## 2023-02-04 DIAGNOSIS — F439 Reaction to severe stress, unspecified: Secondary | ICD-10-CM | POA: Insufficient documentation

## 2023-02-04 MED ORDER — BUTALBITAL-APAP-CAFFEINE 50-325-40 MG PO TABS: 1.0000 | ORAL_TABLET | ORAL | 3 refills | Status: DC | PRN

## 2023-02-04 MED ORDER — AMITRIPTYLINE HCL 150 MG PO TABS
150.0000 mg | ORAL_TABLET | Freq: Every day | ORAL | 3 refills | Status: DC
Start: 2023-02-04 — End: 2024-03-10

## 2023-02-04 NOTE — Progress Notes (Addendum)
Subjective:    Patient ID: Dana Bradley, female    DOB: 20-Jan-1971, 52 y.o.   MRN: [id number]  HPI    1) Chronic Migraine:  -Dana Bradley a 52 year old woman who presents for f/u of her chronic migraine  -took the Dana Bradley and the Dana Bradley last night and her migraine got better -she thinks she been dehydrated -the Dana Bradley has helped -she thinks the Dana Bradley has helped  -migraines have been decent over the last few weeks -she thinks Dana Bradley helps -she is trying not to be stressed -Dana Bradley went to the cardiologist today and they are doing a cardiac MRI, and her fatigue is worse, she was told her heart is not pumping well. --she would like to try Dana Bradley -she has ordered Dana Bradley's magnesium -she has been having stress due to her mother's Alzheimer's disease -she has been very fatigued during the day -she cannot be in light -on Monday she had to wear an eye mask in a darkened room. -Migraines will not get better unless she takes the Dana Bradley with the Dana Bradley but it puts her to sleepy -she feels she is getting a migraine every day -the Dana Bradley and Dana Bradley or Dana Bradley together give her relief, but not complete relief.  -the days run into each other and its hard to remember the differences between them -she has been stressed in caring for her mother -she has noted recently that when she lays flat she feels fine but as soon as she sits up she feels an excruciating migraine -migraine continues to worsen from sitting to standing position -her daughter has similar symptoms but felt better after starting Dana Bradley for presumed diagnosis of intracranial hypertension. She asks whether she may benefit from this medication as well.  Terrible stomach spasms and diarrhea. 12 hours later debilitating migraine. It usually starts in the afternoon. She feels that this may be part of her prodrome. She also has terrible vertigo that is getting worse. The Fiorcet helps a lot, the Dana Bradley helps with the  frontal headache. These make the pain bearable.  -having migraines close to every 24H -she is not functional -she is not sure if Dana Bradley is helping. She is not taking with the Dana Bradley. It is helping some but she is not sure how much. The Dana Bradley alone helps by putting her to sleep.  -she has not heart any news about the Dana Bradley infusion. The medication was approved  -has been having more dizziness.  -the pain has been worst since she had the occipital nerve blocks.  -she knows it is stress related -she feels a pain radiating down her arm as well. It also runs into her head and into her face.  -Sometimes her whole left side is fizzy.  -she has tried Amitriptyline.  -have been awful since December. Had a migraine from Dec 1st throughout 9th.  -the day before she was incapacitated.  -Dana Bradley has been approved -The Dana Bradley and the Fiorocet help together to break the migraine.  -she needs a refill of her Dana Bradley and nausea, she finds this helpful.  -Dana Bradley- she is still working with the company to get this improved -she asks about prodrome and postdrome (she experiences cramping and diarrhea).  -on Sunday she closed out a 56 hour migraines. -she has been trying to follow ketogenic diet.  -Dana Bradley helping- 5 days in a row without a migraines.  -she feels if she could take it as a rescue medication in addition to every other day this may help.  2) Vertigo -Symptoms are worsening.  -She feels that this may be leading up to the vertigo.  -She is interested in vestibular therapy.  -she had a heating pad on the shoulder and arm.   3) Cervical myofasical pain syndrome: -Muscles of neck are very tight, pain is worse on the left side.  -She did not find spinal injections helpful, they made the pain worse.   4) Insomnia: she sleeps in chunks -she is willing to increase amitriptyline to 50mg .   5) Cognitive impairments: -she loves to cook and noticed she has been making more mistakes -she thinks  the headaches are a problem.  6) Pain radiating from back into legs -feels twitches -worse at night -legs kick out at night.   7) IBS -terrible in the night -2am on Sunday. -horrible pain -often with diarrhea and improves symptoms after she has diarrhea.  -cramping.  -last normal BM is 1/9 -having abdominal distension -magnesium citrate does not help -miralax multiple times per day do not help    9) depression: -she has dry mouth will Dana Bradley and did not find much efficacy. She asked if there is an alternative medication she can try -she had tried Dana Bradley in the past with benefits -she is going to pick up the Dana Bradley today and try it tonight -she would be willing to take vitamin D  10) Low back pain: -radiates into her feet -not a good surgical candidate -she feels she will be wheelchair bound soon  11. Stress: -she is stressed that Dana Bradley  Dana Bradley is a 52 year old woman who presents for follow-up of her chronic migraine. Her average pain is 6/10 and pain right now is 6/10. Her pain is intermittent, sharp, burning, stabbing, tingling, and aching.   She recently had 7 days without migraine. She is not sure what was associated with this. She had a pretty terrible bounceback.  She picked up the Migranal nasal spray. Our CMA explained how it works.   Her pain doctor is going to stop prescribing the Lyrica and she needs to find a new doctor to prescribe this.    Pain Inventory Average Pain 5 Pain Right Now 5 My pain is intermittent, sharp, burning, stabbing, tingling, and aching  In the last 24 hours, has pain interfered with the following? General activity 9 Relation with others 10 Enjoyment of life 10 What TIME of day is your pain at its worst? morning , daytime, evening, and night Sleep (in general) Poor  Pain is worse with: bending and some activites Pain improves with: medication, TENS, and HEAT Relief from Meds: 5    Family History   Problem Relation Age of Onset   Depression Mother    Hypertension Mother    Cancer Mother        Skin   Hyperlipidemia Mother    Anxiety disorder Mother    Migraines Mother    Alcohol abuse Father    Depression Father    Stroke Father    Heart disease Father    Hypertension Father    Anxiety disorder Father    Depression Sister    Hyperlipidemia Sister    Diabetes Sister    Hypertension Sister    Polycystic ovary syndrome Sister    Bipolar disorder Sister    Anxiety disorder Sister    Migraines Sister    Depression Sister    Hypertension Sister    Anxiety disorder Sister    Migraines Sister  Breast cancer Maternal Grandmother 60   Cancer Maternal Grandmother 63       Breast   Thyroid disease Maternal Grandmother    Arthritis Maternal Grandmother    Hyperlipidemia Maternal Grandmother    Aneurysm Maternal Grandfather    Hypertension Maternal Grandfather    Heart disease Maternal Grandfather    Alzheimer's disease Paternal Grandmother    Heart attack Paternal Grandfather    Hypertension Paternal Grandfather    COPD Paternal Grandfather    Heart disease Paternal Grandfather    Migraines Daughter    Other Daughter        leomyoa scaroma   Migraines Daughter    Migraines Son    Alzheimer's disease Other    Bladder Cancer Neg Hx    Kidney cancer Neg Hx    Social History   Socioeconomic History   Marital status: Married    Spouse name: brian   Number of children: 3   Years of education: Not on file   Highest education level: Associate degree: academic program  Occupational History   Occupation: disbled    Comment: not able  Tobacco Use   Smoking status: Former    Packs/day: 2.00    Years: 3.00    Additional pack years: 0.00    Total pack years: 6.00    Types: Cigarettes    Quit date: 12/10/1992    Years since quitting: 30.1   Smokeless tobacco: Never   Tobacco comments:    quit 25 years ago  Vaping Use   Vaping Use: Never used  Substance and  Sexual Activity   Alcohol use: No    Comment: socially   Drug use: No   Sexual activity: Not Currently    Partners: Male    Birth control/protection: Surgical  Other Topics Concern   Not on file  Social History Narrative   Lives at home with her husband and 2 of her children   Right handed   Caffeine: 0-2 cups daily   Social Determinants of Health   Financial Resource Strain: High Risk (08/29/2017)   Overall Financial Resource Strain (CARDIA)    Difficulty of Paying Living Expenses: Very hard  Food Insecurity: Food Insecurity Present (08/29/2017)   Hunger Vital Sign    Worried About Running Out of Food in the Last Year: Often true    Ran Out of Food in the Last Year: Often true  Transportation Needs: Unmet Transportation Needs (08/29/2017)   PRAPARE - Administrator, Civil Service (Medical): Yes    Lack of Transportation (Non-Medical): Yes  Physical Activity: Inactive (08/29/2017)   Exercise Vital Sign    Days of Exercise per Week: 0 days    Minutes of Exercise per Session: 0 min  Stress: Stress Concern Present (08/29/2017)   [employer] of Occupational Health - Occupational Stress Questionnaire    Feeling of Stress : Rather much  Social Connections: Somewhat Isolated (08/29/2017)   Social Connection and Isolation Panel [NHANES]    Frequency of Communication with Friends and Family: Never    Frequency of Social Gatherings with Friends and Family: Never    Attends Religious Services: More than 4 times per year    Active Member of [employer] or Organizations: No    Attends Banker Meetings: Never    Marital Status: Married   Past Surgical History:  Procedure Laterality Date   ABLATION     Uterine   BREAST BIOPSY Left    2014 U/S bx fibroadenoma  CARDIAC CATHETERIZATION     with ablation   COLONOSCOPY N/A 09/26/2022   Procedure: COLONOSCOPY;  Surgeon: Wyline Mood, MD;  Location: [facility] ENDOSCOPY;  Service: Gastroenterology;  Laterality: N/A;    COLONOSCOPY WITH PROPOFOL N/A 05/17/2015   Procedure: COLONOSCOPY WITH PROPOFOL;  Surgeon: Scot Jun, MD;  Location: [facility] ENDOSCOPY;  Service: Endoscopy;  Laterality: N/A;   COLONOSCOPY WITH PROPOFOL N/A 03/20/2020   Procedure: COLONOSCOPY WITH PROPOFOL;  Surgeon: Wyline Mood, MD;  Location: [facility] ENDOSCOPY;  Service: Gastroenterology;  Laterality: N/A;   COLONOSCOPY WITH PROPOFOL N/A 09/25/2022   Procedure: COLONOSCOPY WITH PROPOFOL;  Surgeon: Wyline Mood, MD;  Location: [facility] ENDOSCOPY;  Service: Gastroenterology;  Laterality: N/A;   COLONOSCOPY WITH PROPOFOL N/A 10/02/2022   Procedure: COLONOSCOPY WITH PROPOFOL;  Surgeon: Wyline Mood, MD;  Location: [facility] ENDOSCOPY;  Service: Gastroenterology;  Laterality: N/A;   COLONOSCOPY WITH PROPOFOL N/A 11/13/2022   Procedure: COLONOSCOPY WITH PROPOFOL;  Surgeon: Wyline Mood, MD;  Location: [facility] ENDOSCOPY;  Service: Gastroenterology;  Laterality: N/A;   ESOPHAGOGASTRODUODENOSCOPY N/A 05/17/2015   Procedure: ESOPHAGOGASTRODUODENOSCOPY (EGD);  Surgeon: Scot Jun, MD;  Location: [facility] ENDOSCOPY;  Service: Endoscopy;  Laterality: N/A;   ESOPHAGOGASTRODUODENOSCOPY N/A 09/25/2022   Procedure: ESOPHAGOGASTRODUODENOSCOPY (EGD);  Surgeon: Wyline Mood, MD;  Location: [facility] ENDOSCOPY;  Service: Gastroenterology;  Laterality: N/A;   ESOPHAGOGASTRODUODENOSCOPY N/A 09/26/2022   Procedure: ESOPHAGOGASTRODUODENOSCOPY (EGD);  Surgeon: Wyline Mood, MD;  Location: [facility] ENDOSCOPY;  Service: Gastroenterology;  Laterality: N/A;   KNEE ARTHROSCOPY     spg     6/18   Tibial Tubercle Bypass Right 1998   TUBAL LIGATION  10/01/1999   Past Surgical History:  Procedure Laterality Date   ABLATION     Uterine   BREAST BIOPSY Left    2014 U/S bx fibroadenoma   CARDIAC CATHETERIZATION     with ablation   COLONOSCOPY N/A 09/26/2022   Procedure: COLONOSCOPY;  Surgeon: Wyline Mood, MD;  Location: [facility] ENDOSCOPY;  Service: Gastroenterology;  Laterality: N/A;   COLONOSCOPY  WITH PROPOFOL N/A 05/17/2015   Procedure: COLONOSCOPY WITH PROPOFOL;  Surgeon: Scot Jun, MD;  Location: [facility] ENDOSCOPY;  Service: Endoscopy;  Laterality: N/A;   COLONOSCOPY WITH PROPOFOL N/A 03/20/2020   Procedure: COLONOSCOPY WITH PROPOFOL;  Surgeon: Wyline Mood, MD;  Location: [facility] ENDOSCOPY;  Service: Gastroenterology;  Laterality: N/A;   COLONOSCOPY WITH PROPOFOL N/A 09/25/2022   Procedure: COLONOSCOPY WITH PROPOFOL;  Surgeon: Wyline Mood, MD;  Location: [facility] ENDOSCOPY;  Service: Gastroenterology;  Laterality: N/A;   COLONOSCOPY WITH PROPOFOL N/A 10/02/2022   Procedure: COLONOSCOPY WITH PROPOFOL;  Surgeon: Wyline Mood, MD;  Location: [facility] ENDOSCOPY;  Service: Gastroenterology;  Laterality: N/A;   COLONOSCOPY WITH PROPOFOL N/A 11/13/2022   Procedure: COLONOSCOPY WITH PROPOFOL;  Surgeon: Wyline Mood, MD;  Location: [facility] ENDOSCOPY;  Service: Gastroenterology;  Laterality: N/A;   ESOPHAGOGASTRODUODENOSCOPY N/A 05/17/2015   Procedure: ESOPHAGOGASTRODUODENOSCOPY (EGD);  Surgeon: Scot Jun, MD;  Location: [facility] ENDOSCOPY;  Service: Endoscopy;  Laterality: N/A;   ESOPHAGOGASTRODUODENOSCOPY N/A 09/25/2022   Procedure: ESOPHAGOGASTRODUODENOSCOPY (EGD);  Surgeon: Wyline Mood, MD;  Location: [facility] ENDOSCOPY;  Service: Gastroenterology;  Laterality: N/A;   ESOPHAGOGASTRODUODENOSCOPY N/A 09/26/2022   Procedure: ESOPHAGOGASTRODUODENOSCOPY (EGD);  Surgeon: Wyline Mood, MD;  Location: [facility] ENDOSCOPY;  Service: Gastroenterology;  Laterality: N/A;   KNEE ARTHROSCOPY     spg     6/18   Tibial Tubercle Bypass Right 1998   TUBAL LIGATION  10/01/1999   Past Medical History:  Diagnosis Date   Acute postoperative pain 04/07/2017  Anxiety    Bursitis    Chronic fatigue 12/12/2017   Chronic fatigue syndrome    Colitis 2021   Diabetes mellitus without complication (HCC)    Edema leg 05/02/2015   Fibromyalgia    GERD (gastroesophageal reflux disease)    IBS (irritable bowel syndrome)    Knee  pain, bilateral 12/21/2008   Qualifier: Diagnosis of  By: Daphine Deutscher FNP, [place]     Lumbar discitis    Migraines    Osteoarthritis    Right hand pain 04/10/2015   [facility] has done nerve conduction studies and ruled out carpal tunnel.    Sleep apnea    Spinal stenosis    SVT (supraventricular tachycardia)    Vertigo    Vitamin D deficiency 05/01/2016   BP 116/76   Pulse 98   Ht 5\' 3"  (1.6 m)   Wt 225 lb 4.8 oz (102.2 kg)   LMP 10/22/2021 (Approximate)   SpO2 97%   BMI 39.91 kg/m   Opioid Risk Score:   Fall Risk Score:  `1  Depression screen [facility] 2/9     02/04/2023   10:05 AM 01/07/2023    3:28 PM 12/24/2022    2:04 PM 12/09/2022   10:57 AM 11/15/2022   11:17 AM 10/29/2022    9:01 AM 10/25/2022   11:05 AM  Depression screen PHQ 2/9  Decreased Interest 2 2  3  0 0 1  Down, Depressed, Hopeless 2 2 1 3  0 0 1  PHQ - 2 Score 4 4 1 6  0 0 2  Altered sleeping  3 1 3  0    Tired, decreased energy  3  3 0    Change in appetite  3  3 0    Feeling bad or failure about yourself   3  3 0    Trouble concentrating  2  3 0    Moving slowly or fidgety/restless  2  2 0    Suicidal thoughts  0  0 0    PHQ-9 Score  20 2 23  0    Difficult doing work/chores  Very difficult  Extremely dIfficult Not difficult at all     Review of Systems  Constitutional: Negative.   HENT: Negative.    Eyes: Negative.   Respiratory: Negative.    Cardiovascular: Negative.   Gastrointestinal: Negative.   Endocrine: Negative.   Genitourinary: Negative.   Musculoskeletal:  Positive for back pain.       Left side of body from head to toes  Skin: Negative.   Allergic/Immunologic: Negative.   Neurological:  Positive for dizziness and headaches.  Hematological: Negative.   Psychiatric/Behavioral: Negative.    All other systems reviewed and are negative.      Objective:   Physical Exam Gen: no distress, normal appearing HEENT: oral mucosa pink and moist, NCAT Cardio: Reg rate Chest: normal effort,  normal rate of breathing Abd: soft, non-distended Ext: no edema Psych: pleasant, normal affect Skin: intact Abd: distended Ext: no edema Psych: pleasant, normal affect Skin: intact Neuro: Alert and oriented x3     Assessment & Plan:  Mrs. Kaku is a 52 year old woman who presents for f/u with chronic intractable migraines s/p numerous treatments, severe fibromyalgia, and IBS, and nausea, and vertigo.     1) Vertigo: In the future, restart for cervical myofascial pain syndrome: myofascial release, postural correction, stretching and strengthening of the muscles of the neck and upper back, development of HEP. Conitnue heating pads to muscles of  upper back and neck. She is doing HEP.  -referred for vestibular therapy.  -discussed current symptoms.  -discussed response to meclizine -discussed worsening symptoms when in car   2) Migraines: Continue Dana Bradley, which is one of the medications that helps her. Refilled today. She takes this at least once every time she gets a migraine. Advised to use upon migraine onset and to not use more frequently than q6H during migraine. Refilled Zofran for nausea last week, and this has been helping her. Continue metoprolol which can be helpful in migraine prophylaxis (HR is well controlled). Prescribed ergot nasal spray to try upon migraine initiation- advised no more frequently than 4 sprays per hour (still awaiting on prior auth). Discussed avoiding foods that may trigger migraines.  Failed Dana Bradley, ajovy, emgality, continue Dana Bradley trial, increase Dana Bradley to 300mg  -trial of Dana Bradley's magnesium supplement -discussed the elimination diet -prescribed Dana Bradley -continue Qulipta daily prn.  -continue re-entry phase of elimination diet- she has noted chili pepper and eggs to be triggers for her -failed feverfew supplement -discussed lumbar puncture Does get some benefit from Maxalt and Dana Bradley in combination -takes metoprolol -ordered 95 item allergy  test -discussed increasing Dana Bradley or Amitriptyline.  -recommended drinking a glass of water every morning before standing up as her symptoms sound like orthostatic hypotension which could worsen migraines -recommended checking blood pressure daily in supine, sitting, and standing positions and this should help Bradley identify if symptoms are from orthostatic hypotension or intracranial hypertension -discussed trying daily water first before trying medication Dana Bradley.  -continue vitamin D  -Discussed that I can provide refill for her Lyrica when she needs it.  -topamax was not effective.  -When she gets the migraines her loss of words is getting worse.  -Ordered Dana Bradley- she will find out cost from her pharmacy. She will bring paperwork for this next visit. She is excited to try it.  -d/c Dana Bradley since switched to maxalt -continue ear piercing. Pain feels more like pressure and less acidic.  -no benefits from Botox.  -continue to track migraines.  -Continue Baclofen for pain relief. Minimize use of Hydrocodone. She is only taking Lyrica once per day to make it last. Continue Migranal which is helping. Will occipital nerve block -Discussed that Mrs. Denard's greatest source of happiness is her family. This community will be essential in helping her recover from her chronic pain and to increase her daily activity. Her daughter is also suffering from similar migraines unfortunately.  -Continue ginger, herbs, turmeric, blueberries, eating real food. Continue cutting down sugar. Using local honey is a great alternative. -Provided with a pain relief journal and discussed that it contains foods and lifestyle tips to naturally help to improve pain. Discussed that these lifestyle strategies are also very good for health unlike some medications which can have negative side effects. Discussed that the act of keeping a journal can be therapeutic and helpful to realize patterns what helps to trigger and  alleviate pain.   -Continue Dana Bradley every other day- may use for breakthrough migraines as well.  -Discussed plan for Dana Bradley at [facility] infusion center.  Discussed that exercise is one of the most effective treatments for fibromyalgia. This will also help with her obesity. Made goal with Mrs. Muha to walk outside her home at least once per day, and to garden at least once per day (her favorite activity). Can use elliptical which she has at home on rainy days. The heat has been oppressive and so she has been trying to do the  latter more.  Foods to alleviate migraine:  1) dark leafy greens 2) avocado 3) tuna 4) samon and mackerel 5) beans and legumes Supplements that can be helpful: feverfew, B12, and magnesium  Foods to avoid in migraine: 1) Excessive (or irregular timing) coffee 2) red wine 3) aged cheeses 4) chocolate 5) citrus fruits 6) aspartame and other artifical sweeteners 7) yeast 8) MSG (in processed foods) 9) processed and cured meats 10) nuts and certain seeds 11) chicken livers and other organ meats 12) dairy products like buttermilk, sour cream, and yogurt 13) dried fruits like dates, figs, and raisins 14) garlic 15) onions 16) potato chips 17) pickled foods like olives and sauerkraut 18) some fresh fruits like ripe banana, papaya, red plums, raspberries, kiwi, pineapple 19) tomato-based products  Recommend to keep a migraine diary: rate daily the severity of your headache (1-10) and what foods you eat that day to help determine patterns.    3) Cervical facet arthrosis: Cervical XR normal- discussed results with patient. Pain is worse on left side.   4) Anxiety and depression -Her daughter was recently diagnosed with sarcoma and this is a great source of stress and fear to Mrs. Suzie Portela. It has been a turbulent time for her and this has understandably worsened her symptoms. Discussed benefits of gratitude journaling and she plans to try this.  -discussed her life  stressor, her mom's illness -discussed her difficult wean off Pristiq -prescribed Dana Bradley -stop Dana Bradley -prescribed vitamin D -recommended Dana Bradley's magnesium breakthrough -recommended methylated multivitamin -discussed following with Dr. Kieth Brightly.  -increase amitriptyline to 150mg  HS  5) Insomnia:  -continue amitirptyline to 50mg .  -Try to go outside near sunrise -Get exercise during the day.  -Discussed good sleep hygiene: turning off all devices an hour before bedtime.  -Chamomile tea with dinner.  -Can consider over the counter melatonin -discussed magnesium  6) Nausea:  -continue zofran, discussed side effect of constipation -start B6 -discussed scopolamine patch, discussed to place behind ear and lave on 72 hours.    7) Cervical myofascial pain syndrome: Will try some trigger point injections with occipital nerve block next visit.  -increase amitriptyline to 100mg  HS  8) Concern for small bowel obstruction: -stat abdominal MRI ordered given no BM since 1/9.  -recommended to go to [facility] to get this done stat -discussed that it would be beneficial to go to the ED.   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 Wynn Banker for [facility] -discussed that she has been told she is no longer a candidate for injections, or surgery of spinal cord stimulator -Discussed Qutenza as an option for neuropathic pain control. Discussed that this is a capsaicin patch, stronger than capsaicin cream. Discussed that it is currently approved for diabetic peripheral neuropathy and post-herpetic neuralgia, but that it has also shown benefit in treating other forms of neuropathy. Provided patient with link to site to learn more about the patch: https://www.clark.biz/.  Discussed that the patch would be placed in office and benefits usually last 3 months. Discussed that unintended exposure to capsaicin can cause severe irritation of eyes, mucous membranes, respiratory tract, and skin, but that Qutenza is a local treatment and does not have the systemic side effects of other nerve medications. Discussed that there may be pain, itching, erythema, and decreased sensory function associated  with the application of Qutenza. Side effects usually subside within 1 week. A cold pack of analgesic medications can help with these side effects. Blood pressure can also be increased due to pain associated with administration of the patch.  unchanged. Shallow broad-based disc protrusion L4-5 unchanged. -continue Norco -continue turmeric Turmeric to reduce inflammation--can be used in cooking or taken as a supplement.  Benefits of turmeric:  -Highly anti-inflammatory  -Increases antioxidants  -Improves memory, attention, brain disease  -Lowers risk of heart disease  -May help prevent cancer  -Decreases pain  -Alleviates depression  -Delays aging and decreases risk of chronic disease  -Consume with black pepper to increase absorption    Turmeric Milk Recipe:  1 cup milk  1 tsp turmeric  1 tsp cinnamon  1 tsp grated ginger (optional)  Black pepper (boosts the anti-inflammatory properties of turmeric).  1 tsp honey   12) Stress: -discussed the stress from Fiona's recent diagnosis and her mother's health decline -discussed that her daughter is not sleeping well  >40 minutes spent in review of chart, discussion of her family stressors, her insomnia, her migraines, improvements in [place], [place] helps, ordering her food allergy testing, that she is still having 15-16 migraines per month but this is better than 25 per month, discussed that they are trying to grow a garden, that she has been using a lot of turmeric, that she is using quinoa, barley, discussed  increasing amitriptyline 150mg  HS, that they are struggling to get food stamps this month

## 2023-02-04 NOTE — Addendum Note (Signed)
Addended by: Horton Chin on: 02/04/2023 10:41 AM   Modules accepted: Orders, Level of Service

## 2023-02-04 NOTE — Addendum Note (Signed)
Addended by: Horton Chin on: 02/04/2023 10:35 AM   Modules accepted: Orders

## 2023-02-12 ENCOUNTER — Ambulatory Visit: Payer: Medicaid Other | Admitting: Podiatry

## 2023-02-12 ENCOUNTER — Ambulatory Visit (INDEPENDENT_AMBULATORY_CARE_PROVIDER_SITE_OTHER): Payer: Medicaid Other

## 2023-02-12 DIAGNOSIS — L84 Corns and callosities: Secondary | ICD-10-CM

## 2023-02-12 DIAGNOSIS — M7752 Other enthesopathy of left foot: Secondary | ICD-10-CM | POA: Diagnosis not present

## 2023-02-12 DIAGNOSIS — M7750 Other enthesopathy of unspecified foot: Secondary | ICD-10-CM

## 2023-02-12 DIAGNOSIS — M25872 Other specified joint disorders, left ankle and foot: Secondary | ICD-10-CM

## 2023-02-12 NOTE — Patient Instructions (Signed)
Look for urea 40% cream or ointment and apply to the thickened dry skin / calluses. This can be bought over the counter, at a pharmacy or online such as [employer].   Look for an "EvenUp" shoe attachment on [employer] or at [employer]. This will level out your hips while you are walking in the CAM boot. Wear this on the other foot around a supportive sneaker:

## 2023-02-13 ENCOUNTER — Encounter: Payer: Self-pay | Admitting: Family Medicine

## 2023-02-13 ENCOUNTER — Ambulatory Visit: Payer: Medicaid Other | Admitting: Family Medicine

## 2023-02-13 VITALS — BP 120/74 | HR 90 | Temp 98.6°F | Resp 16 | Ht 63.0 in | Wt 226.5 lb

## 2023-02-13 DIAGNOSIS — E119 Type 2 diabetes mellitus without complications: Secondary | ICD-10-CM

## 2023-02-13 DIAGNOSIS — L2489 Irritant contact dermatitis due to other agents: Secondary | ICD-10-CM | POA: Diagnosis not present

## 2023-02-13 DIAGNOSIS — E538 Deficiency of other specified B group vitamins: Secondary | ICD-10-CM

## 2023-02-13 DIAGNOSIS — I10 Essential (primary) hypertension: Secondary | ICD-10-CM

## 2023-02-13 DIAGNOSIS — E782 Mixed hyperlipidemia: Secondary | ICD-10-CM

## 2023-02-13 DIAGNOSIS — I471 Supraventricular tachycardia, unspecified: Secondary | ICD-10-CM | POA: Diagnosis not present

## 2023-02-13 DIAGNOSIS — Z7984 Long term (current) use of oral hypoglycemic drugs: Secondary | ICD-10-CM | POA: Diagnosis not present

## 2023-02-13 DIAGNOSIS — E559 Vitamin D deficiency, unspecified: Secondary | ICD-10-CM | POA: Diagnosis not present

## 2023-02-13 LAB — COMPLETE METABOLIC PANEL WITH GFR
ALT: 28 U/L (ref 6–29)
AST: 20 U/L (ref 10–35)
Albumin: 4.7 g/dL (ref 3.6–5.1)
Globulin: 2.5 g/dL (calc) (ref 1.9–3.7)
Sodium: 140 mmol/L (ref 135–146)

## 2023-02-13 MED ORDER — TRIAMCINOLONE ACETONIDE 0.1 % EX CREA
1.0000 | TOPICAL_CREAM | Freq: Two times a day (BID) | CUTANEOUS | 0 refills | Status: DC | PRN
Start: 2023-02-13 — End: 2024-01-05

## 2023-02-13 NOTE — Assessment & Plan Note (Signed)
She is supposed to be on B12 injections but has been unable to give these at home due to problem with syringe/needle? Recheck labs and reassess

## 2023-02-13 NOTE — Progress Notes (Signed)
Name: Amillian Skehan   MRN: [id number]    DOB: Oct 31, 1970   Date:02/13/2023       Progress Note  Chief Complaint  Patient presents with   Follow-up   Diabetes     Subjective:   Kendrick Caplin is a 52 y.o. female, presents to clinic for f/up on near syncope/BP and her routine f/up  No syncope episodes since last OV  DM:  Pt managing DM with metformin 1000 mg BID Reports good med compliance Pt has no SE from meds. Blood sugars - checking occasionally 140-150 Denies: Polyuria, polydipsia, vision changes, neuropathy, hypoglycemia Recent pertinent labs: Lab Results  Component Value Date   HGBA1C 6.6 (H) 04/17/2022   HGBA1C 7.5 (H) 12/27/2021   HGBA1C 6.8 (A) 06/18/2021   Lab Results  Component Value Date   MICROALBUR 0.3 04/17/2022   LDLCALC 105 (H) 12/27/2021   CREATININE 0.81 09/06/2022   Standard of care and health maintenance: Urine Microalbumin:   Foot exam:  UTD DM eye exam:  due 7/13 ACEI/ARB:  not on right now Statin:  lipitor 40   BP - at goal today BP Readings from Last 3 Encounters:  02/13/23 120/74  02/04/23 116/76  01/28/23 111/63  SVT on metoprolol/BB - pharmacy did not give the new med last appt toprol XL 100 mg instead of metoprolol 50 mg BID  No syncopal episodes She is still having some vertigo/dizzy balance issues and is pending neurology f/up appt soon   She request refill on steroid cream for when she encounters poison oak and poison ivy and has severe itching  Current Outpatient Medications:    amitriptyline (ELAVIL) 150 MG tablet, Take 1 tablet (150 mg total) by mouth at bedtime., Disp: 90 tablet, Rfl: 3   Atogepant (QULIPTA) 10 MG TABS, Take 1 tablet by mouth daily as needed., Disp: 90 tablet, Rfl: 3   atorvastatin (LIPITOR) 40 MG tablet, TAKE ONE TABLET BY MOUTH EVERY NIGHT AT BEDTIME, Disp: 90 tablet, Rfl: 3   baclofen (LIORESAL) 10 MG tablet, Take 0.5-1 tablets (5-10 mg total) by mouth 3 (three) times daily. Each refill must last 30 days.,  Disp: 270 tablet, Rfl: 1   butalbital-acetaminophen-caffeine (BAC) 50-325-40 MG tablet, Take 1 tablet by mouth every 4 (four) hours as needed for migraine., Disp: 60 tablet, Rfl: 3   cyanocobalamin (,VITAMIN B-12,) 1000 MCG/ML injection, INJECT 1 ML (1,000 MCG) INTO THE MUSCLE ONCE FOR 1 DOSE, Disp: 1 mL, Rfl: 3   diclofenac Sodium (VOLTAREN) 1 % GEL, Apply topically., Disp: , Rfl:    Eptinezumab-jjmr (VYEPTI) 100 MG/ML injection, Inject 3 mLs (300 mg total) into the vein every 3 (three) months., Disp: 1.12 mL, Rfl: 3   HYDROcodone-acetaminophen (NORCO) 7.5-325 MG tablet, Take 1 tablet by mouth 2 (two) times daily as needed for severe pain. Must last 30 days, Disp: 25 tablet, Rfl: 0   [START ON 02/16/2023] HYDROcodone-acetaminophen (NORCO) 7.5-325 MG tablet, Take 1 tablet by mouth 2 (two) times daily as needed for severe pain. Must last 30 days, Disp: 25 tablet, Rfl: 0   linaclotide (LINZESS) 290 MCG CAPS capsule, Take 1 capsule (290 mcg total) by mouth daily before breakfast., Disp: 30 capsule, Rfl: 2   lipase/protease/amylase (CREON) 12000-38000 units CPEP capsule, Take 2 capsules by mouth with the first bite of each meal and take 1 capsule before snacks. (Max of 8 capsules per day)., Disp: 270 capsule, Rfl: 1   LORazepam (ATIVAN) 1 MG tablet, Take by mouth., Disp: , Rfl:  meclizine (ANTIVERT) 12.5 MG tablet, Take 1 tablet (12.5 mg total) by mouth 3 (three) times daily as needed for dizziness., Disp: 30 tablet, Rfl: 0   medroxyPROGESTERone (PROVERA) 5 MG tablet, TAKE ONE TABLET BY MOUTH DAILY, Disp: 90 tablet, Rfl: 1   metFORMIN (GLUCOPHAGE) 1000 MG tablet, TAKE ONE TABLET BY MOUTH TWICE A DAY WITH A MEAL, Disp: 180 tablet, Rfl: 1   metoprolol succinate (TOPROL-XL) 100 MG 24 hr tablet, Take 1 tablet (100 mg total) by mouth daily. Take with or immediately following a meal., Disp: 90 tablet, Rfl: 0   naloxone (NARCAN) nasal spray 4 mg/0.1 mL, Place 1 spray into the nose as needed for up to 365 doses  (for opioid-induced respiratory depresssion). In case of emergency (overdose), spray once into each nostril. If no response within 3 minutes, repeat application and call 911., Disp: 1 each, Rfl: 0   Needles & Syringes MISC, For administration of B12 injections, Disp: 30 each, Rfl: 0   ondansetron (ZOFRAN) 4 MG tablet, Take 1 tablet (4 mg total) by mouth every 8 (eight) hours as needed., Disp: 20 tablet, Rfl: 2   pantoprazole (PROTONIX) 40 MG tablet, Take 40 mg by mouth daily., Disp: , Rfl:    pregabalin (LYRICA) 150 MG capsule, TAKE 1 CAPSULE BY MOUTH EVERY 8 HOURS, Disp: 270 capsule, Rfl: 3   Rimegepant Sulfate 75 MG TBDP, Take 1 tablet by mouth every other day., Disp: , Rfl:    rizatriptan (MAXALT) 10 MG tablet, Take 1 tablet (10 mg total) by mouth as needed for migraine. May repeat in 2 hours if needed, Disp: 10 tablet, Rfl: 3   scopolamine (TRANSDERM-SCOP) 1 MG/3DAYS, Place 1 patch (1.5 mg total) onto the skin every 3 (three) days., Disp: 10 patch, Rfl: 12   sertraline (ZOLOFT) 50 MG tablet, Take 1 tablet (50 mg total) by mouth daily., Disp: 90 tablet, Rfl: 3   VALERIAN PO, Take by mouth as needed. Makes Valerian tea about 3-4 times per week., Disp: , Rfl:    Vitamin D, Ergocalciferol, (DRISDOL) 1.25 MG (50000 UNIT) CAPS capsule, Take 1 capsule (50,000 Units total) by mouth every 7 (seven) days., Disp: 7 capsule, Rfl: 0   dicyclomine (BENTYL) 10 MG capsule, Take 1 capsule (10 mg total) by mouth 4 (four) times daily as needed (before meals and at bedtime), Disp: 360 capsule, Rfl: 1   dicyclomine (BENTYL) 10 MG capsule, Take by mouth. (Patient not taking: Reported on 02/13/2023), Disp: , Rfl:    HYDROcodone-acetaminophen (NORCO) 7.5-325 MG tablet, Take 1 tablet by mouth 2 (two) times daily as needed for severe pain. Must last 30 days, Disp: 25 tablet, Rfl: 0  Patient Active Problem List   Diagnosis Date Noted   Abnormal CT of the abdomen 11/13/2022   Adenomatous polyp of colon 11/13/2022    Herniated nucleus pulposus, L3-4 (Right) 10/29/2022   Lumbar radiculopathy 10/21/2022   Dyspepsia 09/26/2022   Blood in stool 09/26/2022   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HCC) 04/04/2022   Neurogenic bladder 01/28/2022   Urinary and fecal incontinence 01/28/2022   New onset type 2 diabetes mellitus (HCC) 01/02/2022   OSA (obstructive sleep apnea) 12/14/2021   Excessive daytime sleepiness 12/14/2021   Lateral epicondylitis, left elbow 07/19/2021   Abnormal MRI, cervical spine (02/14/2021) 02/28/2021   Cervicalgia 02/13/2021   Abnormal MRI, lumbar spine (12/27/2021) 11/09/2020   Chronic midline thoracic back pain 11/09/2020   Abnormal MRI, thoracic spine (08/02/2019) 11/09/2020   Thoracic spinal stenosis (T7-8) 11/09/2020  Prolapse of thoracic disc with radiculopathy (T7-8) 11/09/2020   Spasm of muscle of lower back 11/09/2020   Vertigo, benign paroxysmal, unspecified laterality 11/09/2020   Uncomplicated opioid dependence (HCC) 11/08/2020   Hyperalgesia 03/07/2020   Neurogenic urinary incontinence 03/01/2020   Other intervertebral disc degeneration, lumbar region 03/01/2020   Spinal stenosis of lumbosacral region 03/01/2020   Colon cancer screening 02/06/2020   Lumbar radiculitis (Right) 09/21/2019   Chronic lower extremity pain (Bilateral) 09/06/2019   Intractable migraine with aura without status migrainosus 08/10/2019   Other specified dorsopathies, sacral and sacrococcygeal region 08/03/2019   Latex precautions, history of latex allergy 08/03/2019   History of allergy to radiographic contrast media 08/03/2019   DDD (degenerative disc disease), cervical 07/21/2019   Cervical facet syndrome (Bilateral) (L>R) 07/21/2019   DDD (degenerative disc disease), thoracic 07/21/2019   Osteoarthritis of hip (Left) 07/21/2019   Chronic groin pain (Bilateral) (L>R) 07/21/2019   Chronic hip pain (Bilateral) (L>R) 07/21/2019   Somatic dysfunction of sacroiliac joint (Bilateral) 07/21/2019   Migraine  with aura and with status migrainosus, not intractable 04/06/2019   Cervico-occipital neuralgia (Left) 04/06/2019   Weakness of leg (Left) 04/05/2019   Difficulty walking 04/05/2019   Chronic migraine without aura, with intractable migraine, so stated, with status migrainosus 12/27/2018   Malar rash 09/04/2018   Trigger point of neck (Left) 03/19/2018   Occipital headache 12/25/2017   Chronic fatigue syndrome with fibromyalgia 12/12/2017   Trigger point of shoulder region (Left) 11/17/2017   Myofascial pain syndrome (Left) (trapezius muscle) 07/22/2017   Lumbar L1-2 disc protrusion (Right) 04/07/2017   Muscle spasticity 04/01/2017   Osteoarthritis of shoulder (Bilateral) 04/01/2017   Lumbar spondylosis 01/06/2017   Chronic hip pain (Left) 12/24/2016   Chronic sacroiliac joint pain (Left) 12/24/2016   Lumbar facet syndrome (Bilateral) (L>R) 12/24/2016   Lumbar radiculitis (Left) 12/24/2016   Hypertriglyceridemia 11/27/2016   History of vasovagal episode 10/30/2016   Cervicogenic headache 09/09/2016   Medication monitoring encounter 08/29/2016   Controlled substance agreement signed 08/28/2016   Plantar fasciitis of left foot 08/28/2016   Vitamin B12 deficiency 08/28/2016   Hyperlipidemia 08/28/2016   Nephrolithiasis 08/12/2016   Chronic pain syndrome 08/07/2016   Long term prescription opiate use 08/07/2016   Opiate use 08/07/2016   Long term prescription benzodiazepine use 08/07/2016   Neurogenic pain 08/07/2016   Chronic low back pain (1ry area of Pain) (Bilateral) (R>L) (midline) w/ sciatica (Bilateral) 08/07/2016   Chronic upper back pain (2ry area of Pain) (Bilateral) (L>R) 08/07/2016   Chronic abdominal pain (Right lower quadrant) 08/07/2016   Thoracic radiculitis (Bilateral: T10, T11) 08/07/2016   Chronic occipital neuralgia (3ry area of Pain) (Bilateral) (L>R) 08/07/2016   Chronic neck pain 08/07/2016   Chronic cervical radicular pain (Bilateral) (L>R) 08/07/2016    Chronic shoulder blade pain (Bilateral) (L>R) 08/07/2016   Chronic upper extremity pain (Bilateral) (R>L) 08/07/2016   Chronic knee pain (Bilateral) (R>L) 08/07/2016   Chronic ankle pain (Bilateral) 08/07/2016   Cervical spondylosis with myelopathy and radiculopathy 08/07/2016   Panic disorder with agoraphobia 05/29/2016   Depression, unspecified depression type 05/29/2016   Atypical lymphocytosis 05/01/2016   Vitamin D insufficiency 05/01/2016   Chronic lower extremity cramps (Bilateral) (R>L) 04/29/2016   Obesity, Class III, BMI 40-49.9 (morbid obesity) (HCC) 04/29/2016   GAD (generalized anxiety disorder) 04/29/2016   Fatigue 04/29/2016   Insomnia 07/12/2015   Migraine without aura and with status migrainosus, not intractable 07/12/2015   Chronic superficial gastritis 06/02/2015   Chronic pain  of multiple joints 05/15/2015   Bilateral leg edema 05/02/2015   Paroxysmal supraventricular tachycardia (HCC) 04/17/2015   Exertional shortness of breath 04/17/2015   Bright red rectal bleeding 04/06/2015   DDD (degenerative disc disease), lumbosacral 01/24/2014   DDD (degenerative disc disease), lumbar 01/24/2014   Cervico-occipital neuralgia 12/29/2013   Fibromyalgia 12/29/2013   Migraine headache 12/29/2013   Menorrhagia 12/10/2012   Depression, major, recurrent, in remission (HCC) 01/12/2009   Chest pain 01/12/2009   Hypertension, benign essential, goal below 140/90 06/23/2008   History of PSVT (paroxysmal supraventricular tachycardia) 06/17/2008   Obstructive sleep apnea 06/17/2008   GERD 06/13/2008    Past Surgical History:  Procedure Laterality Date   ABLATION     Uterine   BREAST BIOPSY Left    2014 U/S bx fibroadenoma   CARDIAC CATHETERIZATION     with ablation   COLONOSCOPY N/A 09/26/2022   Procedure: COLONOSCOPY;  Surgeon: Wyline Mood, MD;  Location: [facility] ENDOSCOPY;  Service: Gastroenterology;  Laterality: N/A;   COLONOSCOPY WITH PROPOFOL N/A 05/17/2015   Procedure:  COLONOSCOPY WITH PROPOFOL;  Surgeon: Scot Jun, MD;  Location: [facility] ENDOSCOPY;  Service: Endoscopy;  Laterality: N/A;   COLONOSCOPY WITH PROPOFOL N/A 03/20/2020   Procedure: COLONOSCOPY WITH PROPOFOL;  Surgeon: Wyline Mood, MD;  Location: [facility] ENDOSCOPY;  Service: Gastroenterology;  Laterality: N/A;   COLONOSCOPY WITH PROPOFOL N/A 09/25/2022   Procedure: COLONOSCOPY WITH PROPOFOL;  Surgeon: Wyline Mood, MD;  Location: [facility] ENDOSCOPY;  Service: Gastroenterology;  Laterality: N/A;   COLONOSCOPY WITH PROPOFOL N/A 10/02/2022   Procedure: COLONOSCOPY WITH PROPOFOL;  Surgeon: Wyline Mood, MD;  Location: [facility] ENDOSCOPY;  Service: Gastroenterology;  Laterality: N/A;   COLONOSCOPY WITH PROPOFOL N/A 11/13/2022   Procedure: COLONOSCOPY WITH PROPOFOL;  Surgeon: Wyline Mood, MD;  Location: [facility] ENDOSCOPY;  Service: Gastroenterology;  Laterality: N/A;   ESOPHAGOGASTRODUODENOSCOPY N/A 05/17/2015   Procedure: ESOPHAGOGASTRODUODENOSCOPY (EGD);  Surgeon: Scot Jun, MD;  Location: [facility] ENDOSCOPY;  Service: Endoscopy;  Laterality: N/A;   ESOPHAGOGASTRODUODENOSCOPY N/A 09/25/2022   Procedure: ESOPHAGOGASTRODUODENOSCOPY (EGD);  Surgeon: Wyline Mood, MD;  Location: [facility] ENDOSCOPY;  Service: Gastroenterology;  Laterality: N/A;   ESOPHAGOGASTRODUODENOSCOPY N/A 09/26/2022   Procedure: ESOPHAGOGASTRODUODENOSCOPY (EGD);  Surgeon: Wyline Mood, MD;  Location: [facility] ENDOSCOPY;  Service: Gastroenterology;  Laterality: N/A;   KNEE ARTHROSCOPY     spg     6/18   Tibial Tubercle Bypass Right 1998   TUBAL LIGATION  10/01/1999    Family History  Problem Relation Age of Onset   Depression Mother    Hypertension Mother    Cancer Mother        Skin   Hyperlipidemia Mother    Anxiety disorder Mother    Migraines Mother    Alcohol abuse Father    Depression Father    Stroke Father    Heart disease Father    Hypertension Father    Anxiety disorder Father    Depression Sister    Hyperlipidemia Sister     Diabetes Sister    Hypertension Sister    Polycystic ovary syndrome Sister    Bipolar disorder Sister    Anxiety disorder Sister    Migraines Sister    Depression Sister    Hypertension Sister    Anxiety disorder Sister    Migraines Sister    Breast cancer Maternal Grandmother 36   Cancer Maternal Grandmother 56       Breast   Thyroid disease Maternal Grandmother    Arthritis Maternal Grandmother    Hyperlipidemia  Maternal Grandmother    Aneurysm Maternal Grandfather    Hypertension Maternal Grandfather    Heart disease Maternal Grandfather    Alzheimer's disease Paternal Grandmother    Heart attack Paternal Grandfather    Hypertension Paternal Grandfather    COPD Paternal Grandfather    Heart disease Paternal Grandfather    Migraines Daughter    Other Daughter        leomyoa scaroma   Migraines Daughter    Migraines Son    Alzheimer's disease Other    Bladder Cancer Neg Hx    Kidney cancer Neg Hx     Social History   Tobacco Use   Smoking status: Former    Packs/day: 2.00    Years: 3.00    Additional pack years: 0.00    Total pack years: 6.00    Types: Cigarettes    Quit date: 12/10/1992    Years since quitting: 30.1   Smokeless tobacco: Never   Tobacco comments:    quit 25 years ago  Vaping Use   Vaping Use: Never used  Substance Use Topics   Alcohol use: No    Comment: socially   Drug use: No     Allergies  Allergen Reactions   Aspirin Swelling   Cephalexin Rash   Cymbalta [Duloxetine Hcl] Other (See Comments)    Suicidal ideations and has homicidal thoughts per patient   Depakote [Divalproex Sodium] Shortness Of Breath    W/ n/v   Gadolinium Derivatives     Pt was unable to breath Other reaction(s): Other (See Comments) Pt was unable to breath   Haloperidol Shortness Of Breath    W/ n/v   Meperidine Nausea And Vomiting    Patient projectile vomits and usually result in ER Other reaction(s): Vomiting   Metoclopramide Shortness Of Breath     Can't breath, wheezes Other reaction(s): Other (See Comments)   Morphine     Other reaction(s): Vomiting   Penicillins Rash    Other reaction(s): Unknown   Prochlorperazine Other (See Comments)    Panic attack Other reaction(s): Other (See Comments)   Tramadol Hcl Palpitations    Severely and adversely affects her SVT giving her tachycardias of 150-160 bpm.   Trazodone Shortness Of Breath    Other reaction(s): Other (See Comments)   Meloxicam Other (See Comments)    mouth sores, tingling, blisters in mouth Other reaction(s): Other (See Comments) mout sores, tingling mouth sores, tingling, blisters in mouth   Neomycin-Bacitracin Zn-Polymyx Rash   Tomato Hives    Tongue will blister   Egg-Derived Products Other (See Comments)    inflammation   Milk-Related Compounds Other (See Comments)    inflammation   Other     Other reaction(s): Other (See Comments)   Shellfish Allergy     Other reaction(s): Unknown   Shellfish-Derived Products Other (See Comments)    Other reaction(s): Unknown Other reaction(s): Unknown   Bacitra-Neomycin-Polymyxin-Hc Rash   Bacitracin-Neomycin-Polymyxin Rash   Cephalosporins Rash    rash   Ibuprofen Other (See Comments) and Rash    Blisters in mouth. Blisters in mouth.   Latex Itching    Other reaction(s): Unknown   Nsaids Other (See Comments)    Blisters in mouth; can take 1 ibuprofen 2x a month Other reaction(s): Other (See Comments), Unknown Blisters in mouth; can take 1 ibuprofen 2x a month   Sulfa Antibiotics Rash    rash Other reaction(s): Unknown rash  Other reaction(s): Unknown rash rash Other reaction(s): Unknown rash  Sulfonamide Derivatives Rash    Health Maintenance  Topic Date Due   Diabetic kidney evaluation - Urine ACR  Never done   HEMOGLOBIN A1C  10/18/2022   COVID-19 Vaccine (4 - 2023-24 season) 03/01/2023 (Originally 05/31/2022)   Zoster Vaccines- Shingrix (1 of 2) 03/11/2023 (Originally 06/15/1990)   FOOT EXAM   04/05/2023   OPHTHALMOLOGY EXAM  04/12/2023   INFLUENZA VACCINE  05/01/2023   Diabetic kidney evaluation - eGFR measurement  09/07/2023   MAMMOGRAM  09/14/2023   PAP SMEAR-Modifier  11/14/2023   COLONOSCOPY (Pts 45-37yrs Insurance coverage will need to be confirmed)  11/13/2032   Hepatitis C Screening  Completed   HIV Screening  Completed   HPV VACCINES  Aged Out   DTaP/Tdap/Td  Discontinued    Chart Review Today: I personally reviewed active problem list, medication list, allergies, family history, social history, health maintenance, notes from last encounter, lab results, imaging with the patient/caregiver today.   Review of Systems  Constitutional: Negative.   HENT: Negative.    Eyes: Negative.   Respiratory: Negative.    Cardiovascular: Negative.   Gastrointestinal: Negative.   Endocrine: Negative.   Genitourinary: Negative.   Musculoskeletal: Negative.   Skin: Negative.   Allergic/Immunologic: Negative.   Neurological: Negative.   Hematological: Negative.   Psychiatric/Behavioral: Negative.    All other systems reviewed and are negative.    Objective:   Vitals:   02/13/23 0946 02/13/23 0947  BP: 120/74   Pulse: (!) 102 90  Resp: 16   Temp: 98.6 F (37 C)   TempSrc: Oral   SpO2: 97%   Weight: 226 lb 8 oz (102.7 kg)   Height: 5\' 3"  (1.6 m)     Body mass index is 40.12 kg/m.  Physical Exam Vitals and nursing note reviewed.  Constitutional:      General: She is not in acute distress.    Appearance: She is well-developed. She is obese. She is not ill-appearing, toxic-appearing or diaphoretic.  HENT:     Head: Normocephalic and atraumatic.     Nose: Nose normal.  Eyes:     General:        Right eye: No discharge.        Left eye: No discharge.     Conjunctiva/sclera: Conjunctivae normal.  Neck:     Trachea: No tracheal deviation.  Cardiovascular:     Rate and Rhythm: Normal rate and regular rhythm.     Pulses: Normal pulses.     Heart sounds:  Normal heart sounds.  Pulmonary:     Effort: Pulmonary effort is normal. No respiratory distress.     Breath sounds: Normal breath sounds. No stridor.  Musculoskeletal:     Comments: Left foot in walking boot  Skin:    General: Skin is warm and dry.     Findings: No rash.  Neurological:     Mental Status: She is alert.     Motor: No abnormal muscle tone.     Coordination: Coordination normal.  Psychiatric:        Mood and Affect: Mood normal.        Behavior: Behavior normal.         Assessment & Plan:   Problem List Items Addressed This Visit       Cardiovascular and Mediastinum   Hypertension, benign essential, goal below 140/90    BP at goal today on BB for SVT BP Readings from Last 3 Encounters:  02/13/23 120/74  02/04/23 116/76  01/28/23  111/63        Relevant Orders   COMPLETE METABOLIC PANEL WITH GFR   Paroxysmal supraventricular tachycardia (HCC)    On BB - last OV she wished to change from metoprolol 50 mg BID to Toprol XL daily to see if SE are less - unable to get the meds from the pharmacy so no change, but no near syncopal episodes since last OV        Endocrine   New onset type 2 diabetes mellitus (HCC) - Primary    On Metformin 100 mg BID good compliance, tolerating well w/o se or concerns Has been well controlled but overdue for labs Recent pertinent labs: Lab Results  Component Value Date   HGBA1C 6.6 (H) 04/17/2022   HGBA1C 7.5 (H) 12/27/2021   HGBA1C 6.8 (A) 06/18/2021   Lab Results  Component Value Date   MICROALBUR 0.3 04/17/2022   LDLCALC 105 (H) 12/27/2021   CREATININE 0.81 09/06/2022   Standard of care and health maintenance: Urine Microalbumin:  due today Foot exam:  UTD DM eye exam:  UTD ACEI/ARB:  not taking - would not add at this time with near syncope/postural sx Statin:  yes          Other   Hyperlipidemia (Chronic)    Due for lipids Lab Results  Component Value Date   CHOL 207 (H) 12/27/2021   HDL 38 (L)  12/27/2021   LDLCALC 105 (H) 12/27/2021   TRIG 378 (H) 12/27/2021   CHOLHDL 5.4 (H) 12/27/2021  On statin, tolerating, no SE or concerns Will check labs for response and LDL control and adjust meds as needed      Relevant Orders   COMPLETE METABOLIC PANEL WITH GFR   Lipid panel   Vitamin D deficiency    Last vitamin D Lab Results  Component Value Date   25OHVITD2 5.4 04/01/2017   25OHVITD3 15 04/01/2017   VD25OH 16.49 (L) 02/22/2020  Last labs checked years ago, pt wishes to have them rechecked       Relevant Orders   VITAMIN D 25 Hydroxy (Vit-D Deficiency, Fractures)   Vitamin B12 deficiency    She is supposed to be on B12 injections but has been unable to give these at home due to problem with syringe/needle? Recheck labs and reassess      Relevant Orders   Vitamin B12   Other Visit Diagnoses     Irritant contact dermatitis due to other agents       wishes for med refill - sent in, advised to get acute OV if rash worsens or she is concerned with infection   Relevant Medications   triamcinolone cream (KENALOG) 0.1 %        Return in about 3 months (around 05/16/2023) for Routine follow-up.   Danelle Berry, PA-C 02/13/23 10:04 AM

## 2023-02-13 NOTE — Assessment & Plan Note (Signed)
On Metformin 100 mg BID good compliance, tolerating well w/o se or concerns Has been well controlled but overdue for labs Recent pertinent labs: Lab Results  Component Value Date   HGBA1C 6.6 (H) 04/17/2022   HGBA1C 7.5 (H) 12/27/2021   HGBA1C 6.8 (A) 06/18/2021   Lab Results  Component Value Date   MICROALBUR 0.3 04/17/2022   LDLCALC 105 (H) 12/27/2021   CREATININE 0.81 09/06/2022   Standard of care and health maintenance: Urine Microalbumin:  due today Foot exam:  UTD DM eye exam:  UTD ACEI/ARB:  not taking - would not add at this time with near syncope/postural sx Statin:  yes

## 2023-02-13 NOTE — Assessment & Plan Note (Signed)
On BB - last OV she wished to change from metoprolol 50 mg BID to Toprol XL daily to see if SE are less - unable to get the meds from the pharmacy so no change, but no near syncopal episodes since last OV

## 2023-02-13 NOTE — Assessment & Plan Note (Signed)
BP at goal today on BB for SVT BP Readings from Last 3 Encounters:  02/13/23 120/74  02/04/23 116/76  01/28/23 111/63

## 2023-02-13 NOTE — Patient Instructions (Signed)
Dial soap or hibiclens on skin that seems like you may be getting infection on and cover with vaseline if skin is open and come in if you have a new acute infection.

## 2023-02-13 NOTE — Assessment & Plan Note (Signed)
Last vitamin D Lab Results  Component Value Date   25OHVITD2 5.4 04/01/2017   25OHVITD3 15 04/01/2017   VD25OH 16.49 (L) 02/22/2020  Last labs checked years ago, pt wishes to have them rechecked

## 2023-02-13 NOTE — Assessment & Plan Note (Signed)
Due for lipids Lab Results  Component Value Date   CHOL 207 (H) 12/27/2021   HDL 38 (L) 12/27/2021   LDLCALC 105 (H) 12/27/2021   TRIG 378 (H) 12/27/2021   CHOLHDL 5.4 (H) 12/27/2021   On statin, tolerating, no SE or concerns Will check labs for response and LDL control and adjust meds as needed

## 2023-02-14 LAB — COMPLETE METABOLIC PANEL WITH GFR
CO2: 27 mmol/L (ref 20–32)
Creat: 0.78 mg/dL (ref 0.50–1.03)
Glucose, Bld: 173 mg/dL — ABNORMAL HIGH (ref 65–99)
Potassium: 4.1 mmol/L (ref 3.5–5.3)

## 2023-02-14 LAB — VITAMIN D 25 HYDROXY (VIT D DEFICIENCY, FRACTURES): Vit D, 25-Hydroxy: 32 ng/mL (ref 30–100)

## 2023-02-14 LAB — LIPID PANEL
HDL: 36 mg/dL — ABNORMAL LOW (ref >=50)
LDL Cholesterol (Calc): 91 mg/dL (calc)

## 2023-02-14 LAB — HEMOGLOBIN A1C: Hgb A1c MFr Bld: 7.3 % of total Hgb — ABNORMAL HIGH (ref <5.7)

## 2023-02-14 LAB — VITAMIN B12: Vitamin B-12: 269 pg/mL (ref 200–1100)

## 2023-02-14 NOTE — Progress Notes (Signed)
  Subjective:  Patient ID: Dana Bradley, female    DOB: 06-14-1971,  MRN: [id number]  Chief Complaint  Patient presents with   Foot Pain    )np /last seen 2018/ left foot pain ball of foot near big toe and between big toe as well    52 y.o. female presents with the above complaint. History confirmed with patient.  She also has calluses on her heels that will not go away  Objective:  Physical Exam: warm, good capillary refill, no trophic changes or ulcerative lesions, normal DP and PT pulses, normal sensory exam, and sharp pain to palpation and plantar first metatarsal phalangeal joint on the left foot especially on the fibular sesamoid.  Diffuse callus bilateral heel  Radiographs: Multiple views x-ray of the left foot: No fracture noted but there is sclerosis of the sesamoids Assessment:   1. Sesamoiditis of left foot   2. Callus of foot      Plan:  Patient was evaluated and treated and all questions answered.  Reviewed her radiographs together.  We discussed that she has likely sesamoiditis, there is some sclerosis and is at risk for osteonecrosis.  Do not recommend steroids.  I recommend immobilization and rest in a cam walker boot.  Recommend to minimize weightbearing is much as possible.  RICE protocol reviewed.  She cannot tolerate NSAIDs.  Utilize Tylenol for pain control  Recommend daily maintenance of calluses with pumice stone and urea cream 40%.  Return in about 1 month (around 03/15/2023) for sesamoiditis left foot.

## 2023-02-15 LAB — MICROALBUMIN / CREATININE URINE RATIO
Creatinine, Urine: 44 mg/dL (ref 20–275)
Microalb Creat Ratio: 9 mg/g creat (ref <30)
Microalb, Ur: 0.4 mg/dL

## 2023-02-15 LAB — HEMOGLOBIN A1C
Mean Plasma Glucose: 163 mg/dL
eAG (mmol/L): 9 mmol/L

## 2023-02-15 LAB — COMPLETE METABOLIC PANEL WITH GFR
AG Ratio: 1.9 (calc) (ref 1.0–2.5)
Alkaline phosphatase (APISO): 109 U/L (ref 37–153)
BUN: 12 mg/dL (ref 7–25)
Calcium: 9.5 mg/dL (ref 8.6–10.4)
Chloride: 104 mmol/L (ref 98–110)
Total Bilirubin: 0.5 mg/dL (ref 0.2–1.2)
Total Protein: 7.2 g/dL (ref 6.1–8.1)
eGFR: 92 mL/min/{1.73_m2} (ref >=60)

## 2023-02-15 LAB — LIPID PANEL
Cholesterol: 173 mg/dL (ref <200)
Non-HDL Cholesterol (Calc): 137 mg/dL (calc) — ABNORMAL HIGH (ref <130)
Total CHOL/HDL Ratio: 4.8 (calc) (ref <5.0)
Triglycerides: 343 mg/dL — ABNORMAL HIGH (ref <150)

## 2023-02-18 DIAGNOSIS — K219 Gastro-esophageal reflux disease without esophagitis: Secondary | ICD-10-CM | POA: Diagnosis not present

## 2023-02-18 DIAGNOSIS — E041 Nontoxic single thyroid nodule: Secondary | ICD-10-CM | POA: Diagnosis not present

## 2023-02-18 DIAGNOSIS — R1314 Dysphagia, pharyngoesophageal phase: Secondary | ICD-10-CM | POA: Diagnosis not present

## 2023-02-20 DIAGNOSIS — E041 Nontoxic single thyroid nodule: Secondary | ICD-10-CM | POA: Diagnosis not present

## 2023-02-22 ENCOUNTER — Other Ambulatory Visit: Payer: Self-pay | Admitting: Family Medicine

## 2023-02-22 DIAGNOSIS — L2489 Irritant contact dermatitis due to other agents: Secondary | ICD-10-CM

## 2023-02-23 LAB — ALLERGENS (95) FOODS IGG
C074-IgG Gelatin: <2 ug/mL (ref 0.0–1.9)
Casein IgG: 3.2 ug/mL — ABNORMAL HIGH (ref 0.0–1.9)
Chicken IgG: <2 ug/mL (ref 0.0–1.9)
Chili Pepper IgG: 6.2 ug/mL — ABNORMAL HIGH (ref 0.0–1.9)
Chocolate/Cacao IgG: 3.1 ug/mL — ABNORMAL HIGH (ref 0.0–1.9)
Coffee IgG: <2 ug/mL (ref 0.0–1.9)
Corn IgG: 3.9 ug/mL — ABNORMAL HIGH (ref 0.0–1.9)
F001-IgG Egg White: 5.1 ug/mL — ABNORMAL HIGH (ref 0.0–1.9)
F003-IgG Codfish: <2 ug/mL (ref 0.0–1.9)
F006-IgG Barley, Whole: 5.4 ug/mL — ABNORMAL HIGH (ref 0.0–1.9)
F009-IgG Rice: 3 ug/mL — ABNORMAL HIGH (ref 0.0–1.9)
F010-IgG Sesame Seed: 2.8 ug/mL — ABNORMAL HIGH (ref 0.0–1.9)
F012-IgG Green Pea: <2 ug/mL (ref 0.0–1.9)
F015-IgG White Bean: <2 ug/mL (ref 0.0–1.9)
F020-IgG Almond: <2 ug/mL (ref 0.0–1.9)
F023-IgG Crab: <2 ug/mL (ref 0.0–1.9)
F027-IgG Beef: 8 ug/mL — ABNORMAL HIGH (ref 0.0–1.9)
F031-IgG Carrot: <2 ug/mL (ref 0.0–1.9)
F033-IgG Orange: <2 ug/mL (ref 0.0–1.9)
F036-IgG Coconut: <2 ug/mL (ref 0.0–1.9)
F040-IgG Tuna: <2 ug/mL (ref 0.0–1.9)
F041-IgG Salmon: <2 ug/mL (ref 0.0–1.9)
F044-IgG Strawberry: <2 ug/mL (ref 0.0–1.9)
F047-IgG Garlic: 15.4 ug/mL — ABNORMAL HIGH (ref 0.0–1.9)
F049-IgG Apple: <2 ug/mL (ref 0.0–1.9)
F054-IgG Sweet Potato: <2 ug/mL (ref 0.0–1.9)
F075-IgG Egg (Yolk): 4.8 ug/mL — ABNORMAL HIGH (ref 0.0–1.9)
F076-IgG Alpha Lactalbumin: <2 ug/mL (ref 0.0–1.9)
F077-IgG Beta Lactoglobulin: 5.5 ug/mL — ABNORMAL HIGH (ref 0.0–1.9)
F079-IgG Gluten: <2 ug/mL (ref 0.0–1.9)
F080-IgG Lobster: <2 ug/mL (ref 0.0–1.9)
F081-IgG Cheese, Cheddar Type: 4 ug/mL — ABNORMAL HIGH (ref 0.0–1.9)
F085-IgG Celery: <2 ug/mL (ref 0.0–1.9)
F087-IgG Melon: <2 ug/mL (ref 0.0–1.9)
F089-IgG Mustard: <2 ug/mL (ref 0.0–1.9)
F090-IgG Malt: 12.4 ug/mL — ABNORMAL HIGH (ref 0.0–1.9)
F092-IgG Banana: 6.9 ug/mL — ABNORMAL HIGH (ref 0.0–1.9)
F094-IgG Pear: <2 ug/mL (ref 0.0–1.9)
F095-IgG Peach: <2 ug/mL (ref 0.0–1.9)
F096-IgG Avocado: <2 ug/mL (ref 0.0–1.9)
F182-IgG Lima Bean: <2 ug/mL (ref 0.0–1.9)
F202-IgG Cashew Nut: <2 ug/mL (ref 0.0–1.9)
F207-IgG Clam: <2 ug/mL (ref 0.0–1.9)
F208-IgG Lemon: <2 ug/mL (ref 0.0–1.9)
F209-IgG Grapefruit: <2 ug/mL (ref 0.0–1.9)
F210-IgG Pineapple: 4 ug/mL — ABNORMAL HIGH (ref 0.0–1.9)
F212-IgG Mushroom: 2.6 ug/mL — ABNORMAL HIGH (ref 0.0–1.9)
F214-IgG Spinach: <2 ug/mL (ref 0.0–1.9)
F215-IgG Lettuce: <2 ug/mL (ref 0.0–1.9)
F216-IgG Cabbage: <2 ug/mL (ref 0.0–1.9)
F218-IgG Paprika/Sweet Pepper: 7.4 ug/mL — ABNORMAL HIGH (ref 0.0–1.9)
F220-IgG Cinnamon: 2.7 ug/mL — ABNORMAL HIGH (ref 0.0–1.9)
F222-IgG Tea: 3.1 ug/mL — ABNORMAL HIGH (ref 0.0–1.9)
F225-IgG Pumpkin: <2 ug/mL (ref 0.0–1.9)
F234-IgG Vanilla: 7.9 ug/mL — ABNORMAL HIGH (ref 0.0–1.9)
F236-IgG Whey: 11.9 ug/mL — ABNORMAL HIGH (ref 0.0–1.9)
F242-IgG Bing Cherry: <2 ug/mL (ref 0.0–1.9)
F244-IgG Cucumber: <2 ug/mL (ref 0.0–1.9)
F256-IgG Walnut: <2 ug/mL (ref 0.0–1.9)
F259-IgG Grape: 2 ug/mL — ABNORMAL HIGH (ref 0.0–1.9)
F260-IgG Broccoli: <2 ug/mL (ref 0.0–1.9)
F261-IgG Asparagus: <2 ug/mL (ref 0.0–1.9)
F262-IgG Eggplant: <2 ug/mL (ref 0.0–1.9)
F265-IgG Cumin: 2.9 ug/mL — ABNORMAL HIGH (ref 0.0–1.9)
F269-IgG Basil: <2 ug/mL (ref 0.0–1.9)
F270-IgG Ginger: <2 ug/mL (ref 0.0–1.9)
F278-IgG Bayleaf (Laurel): <2 ug/mL (ref 0.0–1.9)
F280-IgG Black Pepper: 14.1 ug/mL — ABNORMAL HIGH (ref 0.0–1.9)
F283-IgG Oregano: <2 ug/mL (ref 0.0–1.9)
F284-IgG Turkey: <2 ug/mL (ref 0.0–1.9)
F287-IgG Kidney Bean: <2 ug/mL (ref 0.0–1.9)
F288-IgG Blueberry: 2.1 ug/mL — ABNORMAL HIGH (ref 0.0–1.9)
F291-IgG Cauliflower: <2 ug/mL (ref 0.0–1.9)
F300-IgG Goat's Milk: 4.8 ug/mL — ABNORMAL HIGH (ref 0.0–1.9)
F306-IgG Lime: <2 ug/mL (ref 0.0–1.9)
F319-IgG Red Beet: 5.4 ug/mL — ABNORMAL HIGH (ref 0.0–1.9)
F324-IgG Hop (Food): <2 ug/mL (ref 0.0–1.9)
F329-IgG Watermelon: <2 ug/mL (ref 0.0–1.9)
F338-IgG Scallop: <2 ug/mL (ref 0.0–1.9)
F341-IgG Cranberry: <2 ug/mL (ref 0.0–1.9)
F342-IgG Olive, Black: <2 ug/mL (ref 0.0–1.9)
F343-IgG Raspberry: <2 ug/mL (ref 0.0–1.9)
Green Bean IgG: 2.5 ug/mL — ABNORMAL HIGH (ref 0.0–1.9)
Lamb IgG: <2 ug/mL (ref 0.0–1.9)
Oat IgG: 2.9 ug/mL — ABNORMAL HIGH (ref 0.0–1.9)
Onion IgG: 3.7 ug/mL — ABNORMAL HIGH (ref 0.0–1.9)
Peanut IgG: <2 ug/mL (ref 0.0–1.9)
Pork IgG: <2 ug/mL (ref 0.0–1.9)
Potato, White, IgG: <2 ug/mL (ref 0.0–1.9)
Rye IgG: <2 ug/mL (ref 0.0–1.9)
Shrimp IgG: <2 ug/mL (ref 0.0–1.9)
Soybean IgG: <2 ug/mL (ref 0.0–1.9)
Tomato IgG: <2 ug/mL (ref 0.0–1.9)
Wheat IgG: <2 ug/mL (ref 0.0–1.9)
Yeast IgG: <2 ug/mL (ref 0.0–1.9)

## 2023-02-25 NOTE — Telephone Encounter (Signed)
Requested medications are due for refill today.  Unsure  Requested medications are on the active medications list.  yes  Last refill. 02/13/2023 30g 0 rf  Future visit scheduled.   yes  Notes to clinic.  Refill not delegated.    Requested Prescriptions  Pending Prescriptions Disp Refills   triamcinolone cream (KENALOG) 0.1 % [Pharmacy Med Name: TRIAMCINOLONE ACETONIDE 0.1% CREAM] 30 g 0    Sig: APPLY ONE APPLICATION TOPICALLY TWO TIMES DAILY AS NEEDED (ITCH/RASH). DO NOT APPLY TO OPEN SKIN. (1 GRAM PER APPLICATION PER DOCTOR)     Not Delegated - Dermatology:  Corticosteroids Failed - 02/22/2023  9:48 AM      Failed - This refill cannot be delegated      Passed - Valid encounter within last 12 months    Recent Outpatient Visits           1 week ago Type 2 diabetes mellitus without complication, without long-term current use of insulin [facility])   [facility] Danelle Berry, PA-C   1 month ago Near syncope   [facility] Danelle Berry, [place]   2 months ago Neck mass   [facility] Danelle Berry, PA-C   3 months ago Pharyngitis, unspecified etiology   [facility] Danelle Berry, PA-C   4 months ago Acute pain of left knee   [facility] Danelle Berry, PA-C       Future Appointments             In 2 months Danelle Berry, PA-C [facility]   In 9 months Danelle Berry, PA-C [facility], [facility]

## 2023-02-27 ENCOUNTER — Telehealth: Payer: Self-pay

## 2023-02-27 NOTE — Telephone Encounter (Signed)
denied °

## 2023-02-27 NOTE — Telephone Encounter (Signed)
PA for [facility] submitted

## 2023-02-28 NOTE — Telephone Encounter (Signed)
Outcome Denied on May 30 PA Case: [id number], Status: Denied. Notification: Completed.

## 2023-03-03 ENCOUNTER — Encounter: Payer: Medicaid Other | Attending: Psychology | Admitting: Physical Medicine and Rehabilitation

## 2023-03-03 DIAGNOSIS — G43001 Migraine without aura, not intractable, with status migrainosus: Secondary | ICD-10-CM

## 2023-03-03 NOTE — Progress Notes (Signed)
Subjective:    Patient ID: Dana Bradley, female    DOB: [birth year], 52 y.o.   MRN: [id number]  HPI  An audio/video tele-health visit is felt to be the most appropriate encounter for this patient at this time. This is a follow up tele-visit via phone. The patient is at home. MD is at office. Prior to scheduling this appointment, our staff discussed the limitations of evaluation and management by telemedicine and the availability of in-person appointments. The patient expressed understanding and agreed to proceed.   1) Chronic Migraine:  -Dana Bradley a 52 year old woman who presents for f/u of her chronic migraine  -discussed her food allergy test results -took the Nurtec and the Fioricet last night and her migraine got better -she thinks she been dehydrated -the Dana Bradley has helped -she thinks the Vyepti has helped  -migraines have been decent over the last few weeks -she thinks Vyepti helps -she is trying not to be stressed -Dana Bradley went to the cardiologist today and they are doing a cardiac MRI, and her fatigue is worse, she was told her heart is not pumping well. --she would like to try Zavzpret -she has ordered Bi[employer]em[employer]'s magnesium -she has been having stress due to her mother's Alzheimer's disease -she has been very fatigued during the day -she cannot be in light -on Monday she had to wear an eye mask in a darkened room. -Migraines will not get better unless she takes the Fioricet with the maxaalt but it puts her to sleepy -she feels she is getting a migraine every day -the Fioricet and Nurtec or Maxaalt together give her relief, but not complete relief.  -the days run into each other and its hard to remember the differences between them -she has been stressed in caring for her mother -she has noted recently that when she lays flat she feels fine but as soon as she sits up she feels an excruciating migraine -migraine continues to worsen from sitting to standing position -her  daughter has similar symptoms but felt better after starting acetazolamide for presumed diagnosis of intracranial hypertension. She asks whether she may benefit from this medication as well.  Terrible stomach spasms and diarrhea. 12 hours later debilitating migraine. It usually starts in the afternoon. She feels that this may be part of her prodrome. She also has terrible vertigo that is getting worse. The Fiorcet helps a lot, the Migronal helps with the frontal headache. These make the pain bearable.  -having migraines close to every 24H -she is not functional -she is not sure if Maxaalt is helping. She is not taking with the Nurtec. It is helping some but she is not sure how much. The fioricet alone helps by putting her to sleep.  -she has not heart any news about the Vypeti infusion. The medication was approved  -has been having more dizziness.  -the pain has been worst since she had the occipital nerve blocks.  -she knows it is stress related -she feels a pain radiating down her arm as well. It also runs into her head and into her face.  -Sometimes her whole left side is fizzy.  -she has tried Amitriptyline.  -have been awful since December. Had a migraine from Dec 1st throughout 9th.  -the day before she was incapacitated.  -Vyepti has been approved -The nurtec and the Fiorocet help together to break the migraine.  -she needs a refill of her Fioricet and nausea, she finds this helpful.  -Nurtec- she is still  working with the company to get this improved -she asks about prodrome and postdrome (she experiences cramping and diarrhea).  -on Sunday she closed out a 56 hour migraines. -she has been trying to follow ketogenic diet.  -Nurtec helping- 5 days in a row without a migraines.  -she feels if she could take it as a rescue medication in addition to every other day this may help.   2) Vertigo -Symptoms are worsening.  -She feels that this may be leading up to the vertigo.  -She is  interested in vestibular therapy.  -she had a heating pad on the shoulder and arm.   3) Cervical myofasical pain syndrome: -Muscles of neck are very tight, pain is worse on the left side.  -She did not find spinal injections helpful, they made the pain worse.   4) Insomnia: she sleeps in chunks -she is willing to increase amitriptyline to 50mg .   5) Cognitive impairments: -she loves to cook and noticed she has been making more mistakes -she thinks the headaches are a problem.  6) Pain radiating from back into legs -feels twitches -worse at night -legs kick out at night.   7) IBS -terrible in the night -2am on Sunday. -horrible pain -often with diarrhea and improves symptoms after she has diarrhea.  -cramping.  -last normal BM is 1/9 -having abdominal distension -magnesium citrate does not help -miralax multiple times per day do not help    9) depression: -she has dry mouth will wellbutrin and did not find much efficacy. She asked if there is an alternative medication she can try -she had tried Zoloft in the past with benefits -she is going to pick up the Zoloft today and try it tonight -she would be willing to take vitamin D  10) Low back pain: -radiates into her feet -not a good surgical candidate -she feels she will be wheelchair bound soon  11. Stress: -she is stressed that Dana Bradley as a pleural lesion  Dana Bradley is a 52 year old woman who presents for follow-up of her chronic migraine. Her average pain is 6/10 and pain right now is 6/10. Her pain is intermittent, sharp, burning, stabbing, tingling, and aching.   She recently had 7 days without migraine. She is not sure what was associated with this. She had a pretty terrible bounceback.  She picked up the Migranal nasal spray. Our CMA explained how it works.   Her pain doctor is going to stop prescribing the Lyrica and she needs to find a new doctor to prescribe this.    Pain Inventory Average Pain 5 Pain  Right Now 5 My pain is intermittent, sharp, burning, stabbing, tingling, and aching  In the last 24 hours, has pain interfered with the following? General activity 9 Relation with others 10 Enjoyment of life 10 What TIME of day is your pain at its worst? morning , daytime, evening, and night Sleep (in general) Poor  Pain is worse with: bending and some activites Pain improves with: medication, TENS, and HEAT Relief from Meds: 5    Family History  Problem Relation Age of Onset   Depression Mother    Hypertension Mother    Cancer Mother        Skin   Hyperlipidemia Mother    Anxiety disorder Mother    Migraines Mother    Alcohol abuse Father    Depression Father    Stroke Father    Heart disease Father    Hypertension Father  Anxiety disorder Father    Depression Sister    Hyperlipidemia Sister    Diabetes Sister    Hypertension Sister    Polycystic ovary syndrome Sister    Bipolar disorder Sister    Anxiety disorder Sister    Migraines Sister    Depression Sister    Hypertension Sister    Anxiety disorder Sister    Migraines Sister    Breast cancer Maternal Grandmother 78   Cancer Maternal Grandmother 39       Breast   Thyroid disease Maternal Grandmother    Arthritis Maternal Grandmother    Hyperlipidemia Maternal Grandmother    Aneurysm Maternal Grandfather    Hypertension Maternal Grandfather    Heart disease Maternal Grandfather    Alzheimer's disease Paternal Grandmother    Heart attack Paternal Grandfather    Hypertension Paternal Grandfather    COPD Paternal Grandfather    Heart disease Paternal Grandfather    Migraines Daughter    Other Daughter        leomyoa scaroma   Migraines Daughter    Migraines Son    Alzheimer's disease Other    Bladder Cancer Neg Hx    Kidney cancer Neg Hx    Social History   Socioeconomic History   Marital status: Married    Spouse name: brian   Number of children: 3   Years of education: Not on file    Highest education level: Associate degree: academic program  Occupational History   Occupation: disbled    Comment: not able  Tobacco Use   Smoking status: Former    Packs/day: 2.00    Years: 3.00    Additional pack years: 0.00    Total pack years: 6.00    Types: Cigarettes    Quit date: 12/10/1992    Years since quitting: 30.2   Smokeless tobacco: Never   Tobacco comments:    quit 25 years ago  Vaping Use   Vaping Use: Never used  Substance and Sexual Activity   Alcohol use: No    Comment: socially   Drug use: No   Sexual activity: Not Currently    Partners: Male    Birth control/protection: Surgical  Other Topics Concern   Not on file  Social History Narrative   Lives at home with her husband and 2 of her children   Right handed   Caffeine: 0-2 cups daily   Social Determinants of Health   Financial Resource Strain: High Risk (08/29/2017)   Overall Financial Resource Strain (CARDIA)    Difficulty of Paying Living Expenses: Very hard  Food Insecurity: Food Insecurity Present (08/29/2017)   Hunger Vital Sign    Worried About Running Out of Food in the Last Year: Often true    Ran Out of Food in the Last Year: Often true  Transportation Needs: Unmet Transportation Needs (08/29/2017)   PRAPARE - Administrator, Civil Service (Medical): Yes    Lack of Transportation (Non-Medical): Yes  Physical Activity: Inactive (08/29/2017)   Exercise Vital Sign    Days of Exercise per Week: 0 days    Minutes of Exercise per Session: 0 min  Stress: Stress Concern Present (08/29/2017)   [employer] of Occupational Health - Occupational Stress Questionnaire    Feeling of Stress : Rather much  Social Connections: Somewhat Isolated (08/29/2017)   Social Connection and Isolation Panel [NHANES]    Frequency of Communication with Friends and Family: Never    Frequency of Social Gatherings with  Friends and Family: Never    Attends Religious Services: More than 4 times per  year    Active Member of Clubs or Organizations: No    Attends Banker Meetings: Never    Marital Status: Married   Past Surgical History:  Procedure Laterality Date   ABLATION     Uterine   BREAST BIOPSY Left    2014 U/S bx fibroadenoma   CARDIAC CATHETERIZATION     with ablation   COLONOSCOPY N/A 09/26/2022   Procedure: COLONOSCOPY;  Surgeon: Wyline Mood, MD;  Location: [facility] ENDOSCOPY;  Service: Gastroenterology;  Laterality: N/A;   COLONOSCOPY WITH PROPOFOL N/A 05/17/2015   Procedure: COLONOSCOPY WITH PROPOFOL;  Surgeon: Scot Jun, MD;  Location: [facility] ENDOSCOPY;  Service: Endoscopy;  Laterality: N/A;   COLONOSCOPY WITH PROPOFOL N/A 03/20/2020   Procedure: COLONOSCOPY WITH PROPOFOL;  Surgeon: Wyline Mood, MD;  Location: [facility] ENDOSCOPY;  Service: Gastroenterology;  Laterality: N/A;   COLONOSCOPY WITH PROPOFOL N/A 09/25/2022   Procedure: COLONOSCOPY WITH PROPOFOL;  Surgeon: Wyline Mood, MD;  Location: [facility] ENDOSCOPY;  Service: Gastroenterology;  Laterality: N/A;   COLONOSCOPY WITH PROPOFOL N/A 10/02/2022   Procedure: COLONOSCOPY WITH PROPOFOL;  Surgeon: Wyline Mood, MD;  Location: [facility] ENDOSCOPY;  Service: Gastroenterology;  Laterality: N/A;   COLONOSCOPY WITH PROPOFOL N/A 11/13/2022   Procedure: COLONOSCOPY WITH PROPOFOL;  Surgeon: Wyline Mood, MD;  Location: [facility] ENDOSCOPY;  Service: Gastroenterology;  Laterality: N/A;   ESOPHAGOGASTRODUODENOSCOPY N/A 05/17/2015   Procedure: ESOPHAGOGASTRODUODENOSCOPY (EGD);  Surgeon: Scot Jun, MD;  Location: [facility] ENDOSCOPY;  Service: Endoscopy;  Laterality: N/A;   ESOPHAGOGASTRODUODENOSCOPY N/A 09/25/2022   Procedure: ESOPHAGOGASTRODUODENOSCOPY (EGD);  Surgeon: Wyline Mood, MD;  Location: [facility] ENDOSCOPY;  Service: Gastroenterology;  Laterality: N/A;   ESOPHAGOGASTRODUODENOSCOPY N/A 09/26/2022   Procedure: ESOPHAGOGASTRODUODENOSCOPY (EGD);  Surgeon: Wyline Mood, MD;  Location: [facility] ENDOSCOPY;  Service: Gastroenterology;   Laterality: N/A;   KNEE ARTHROSCOPY     spg     6/18   Tibial Tubercle Bypass Right 1998   TUBAL LIGATION  10/01/1999   Past Surgical History:  Procedure Laterality Date   ABLATION     Uterine   BREAST BIOPSY Left    2014 U/S bx fibroadenoma   CARDIAC CATHETERIZATION     with ablation   COLONOSCOPY N/A 09/26/2022   Procedure: COLONOSCOPY;  Surgeon: Wyline Mood, MD;  Location: [facility] ENDOSCOPY;  Service: Gastroenterology;  Laterality: N/A;   COLONOSCOPY WITH PROPOFOL N/A 05/17/2015   Procedure: COLONOSCOPY WITH PROPOFOL;  Surgeon: Scot Jun, MD;  Location: [facility] ENDOSCOPY;  Service: Endoscopy;  Laterality: N/A;   COLONOSCOPY WITH PROPOFOL N/A 03/20/2020   Procedure: COLONOSCOPY WITH PROPOFOL;  Surgeon: Wyline Mood, MD;  Location: [facility] ENDOSCOPY;  Service: Gastroenterology;  Laterality: N/A;   COLONOSCOPY WITH PROPOFOL N/A 09/25/2022   Procedure: COLONOSCOPY WITH PROPOFOL;  Surgeon: Wyline Mood, MD;  Location: [facility] ENDOSCOPY;  Service: Gastroenterology;  Laterality: N/A;   COLONOSCOPY WITH PROPOFOL N/A 10/02/2022   Procedure: COLONOSCOPY WITH PROPOFOL;  Surgeon: Wyline Mood, MD;  Location: [facility] ENDOSCOPY;  Service: Gastroenterology;  Laterality: N/A;   COLONOSCOPY WITH PROPOFOL N/A 11/13/2022   Procedure: COLONOSCOPY WITH PROPOFOL;  Surgeon: Wyline Mood, MD;  Location: [facility] ENDOSCOPY;  Service: Gastroenterology;  Laterality: N/A;   ESOPHAGOGASTRODUODENOSCOPY N/A 05/17/2015   Procedure: ESOPHAGOGASTRODUODENOSCOPY (EGD);  Surgeon: Scot Jun, MD;  Location: [facility] ENDOSCOPY;  Service: Endoscopy;  Laterality: N/A;   ESOPHAGOGASTRODUODENOSCOPY N/A 09/25/2022   Procedure: ESOPHAGOGASTRODUODENOSCOPY (EGD);  Surgeon: Wyline Mood, MD;  Location: [facility] ENDOSCOPY;  Service: Gastroenterology;  Laterality: N/A;   ESOPHAGOGASTRODUODENOSCOPY N/A 09/26/2022   Procedure: ESOPHAGOGASTRODUODENOSCOPY (EGD);  Surgeon: Wyline Mood, MD;  Location: [facility] ENDOSCOPY;  Service: Gastroenterology;  Laterality:  N/A;   KNEE ARTHROSCOPY     spg     6/18   Tibial Tubercle Bypass Right 1998   TUBAL LIGATION  10/01/1999   Past Medical History:  Diagnosis Date   Acute postoperative pain 04/07/2017   Anxiety    Bursitis    Chronic fatigue 12/12/2017   Chronic fatigue syndrome    Colitis 2021   Diabetes mellitus without complication (HCC)    Edema leg 05/02/2015   Fibromyalgia    GERD (gastroesophageal reflux disease)    IBS (irritable bowel syndrome)    Knee pain, bilateral 12/21/2008   Qualifier: Diagnosis of  By: Daphine Deutscher FNP, [place]     Lumbar discitis    Migraines    Osteoarthritis    Right hand pain 04/10/2015   [facility] has done nerve conduction studies and ruled out carpal tunnel.    Sleep apnea    Spinal stenosis    SVT (supraventricular tachycardia)    Vertigo    Vitamin D deficiency 05/01/2016   LMP 10/22/2021 (Approximate)   Opioid Risk Score:   Fall Risk Score:  `1  Depression screen [facility] 2/9     02/13/2023    9:43 AM 02/04/2023   10:05 AM 01/07/2023    3:28 PM 12/24/2022    2:04 PM 12/09/2022   10:57 AM 11/15/2022   11:17 AM 10/29/2022    9:01 AM  Depression screen PHQ 2/9  Decreased Interest 3 2 2  3  0 0  Down, Depressed, Hopeless 3 2 2 1 3  0 0  PHQ - 2 Score 6 4 4 1 6  0 0  Altered sleeping 3  3 1 3  0   Tired, decreased energy 3  3  3  0   Change in appetite 1  3  3  0   Feeling bad or failure about yourself  0  3  3 0   Trouble concentrating 1  2  3  0   Moving slowly or fidgety/restless 0  2  2 0   Suicidal thoughts 0  0  0 0   PHQ-9 Score 14  20 2 23  0   Difficult doing work/chores Very difficult  Very difficult  Extremely dIfficult Not difficult at all    Review of Systems  Constitutional: Negative.   HENT: Negative.    Eyes: Negative.   Respiratory: Negative.    Cardiovascular: Negative.   Gastrointestinal: Negative.   Endocrine: Negative.   Genitourinary: Negative.   Musculoskeletal:  Positive for back pain.       Left side of body from head  to toes  Skin: Negative.   Allergic/Immunologic: Negative.   Neurological:  Positive for dizziness and headaches.  Hematological: Negative.   Psychiatric/Behavioral: Negative.    All other systems reviewed and are negative.      Objective:   Physical Exam Not performed     Assessment & Plan:  Dana Bradley is a 52 year old woman who presents for f/u with chronic intractable migraines s/p numerous treatments, severe fibromyalgia, and IBS, and nausea, and vertigo.     1) Vertigo: In the future, restart for cervical myofascial pain syndrome: myofascial release, postural correction, stretching and strengthening of the muscles of the neck and upper back, development of HEP. Conitnue heating pads to muscles of upper back and neck. She is  doing HEP.  -referred for vestibular therapy.  -discussed current symptoms.  -discussed response to meclizine -discussed worsening symptoms when in car   2) Migraines:  -discussed food allergy results -discussed that highest priority is to eliminate gluten Continue Fioricet, which is one of the medications that helps her. Refilled today. She takes this at least once every time she gets a migraine. Advised to use upon migraine onset and to not use more frequently than q6H during migraine. Refilled Zofran for nausea last week, and this has been helping her. Continue metoprolol which can be helpful in migraine prophylaxis (HR is well controlled). Prescribed ergot nasal spray to try upon migraine initiation- advised no more frequently than 4 sprays per hour (still awaiting on prior auth). Discussed avoiding foods that may trigger migraines.  Failed vypeti, ajovy, emgality, continue Vypeti trial, increase Vyepti to 300mg  -trial of [employer]'s magnesium supplement -discussed the elimination diet -prescribed Zavzpret -continue Qulipta daily prn.  -continue re-entry phase of elimination diet- she has noted chili pepper and eggs to be triggers for her -failed  feverfew supplement -discussed lumbar puncture Does get some benefit from Maxalt and Fioricet in combination -takes metoprolol -ordered 95 item allergy test -discussed increasing Fioricet or Amitriptyline.  -recommended drinking a glass of water every morning before standing up as her symptoms sound like orthostatic hypotension which could worsen migraines -recommended checking blood pressure daily in supine, sitting, and standing positions and this should help Bradley identify if symptoms are from orthostatic hypotension or intracranial hypertension -discussed trying daily water first before trying medication acetazolamide.  -continue vitamin D  -Discussed that I can provide refill for her Lyrica when she needs it.  -topamax was not effective.  -When she gets the migraines her loss of words is getting worse.  -Ordered Vyepti- she will find out cost from her pharmacy. She will bring paperwork for this next visit. She is excited to try it.  -d/c nurtec since switched to maxalt -continue ear piercing. Pain feels more like pressure and less acidic.  -no benefits from Botox.  -continue to track migraines.  -Continue Baclofen for pain relief. Minimize use of Hydrocodone. She is only taking Lyrica once per day to make it last. Continue Migranal which is helping. Will occipital nerve block -Discussed that Dana Bradley's greatest source of happiness is her family. This community will be essential in helping her recover from her chronic pain and to increase her daily activity. Her daughter is also suffering from similar migraines unfortunately.  -Continue ginger, herbs, turmeric, blueberries, eating real food. Continue cutting down sugar. Using local honey is a great alternative. -Provided with a pain relief journal and discussed that it contains foods and lifestyle tips to naturally help to improve pain. Discussed that these lifestyle strategies are also very good for health unlike some medications which can  have negative side effects. Discussed that the act of keeping a journal can be therapeutic and helpful to realize patterns what helps to trigger and alleviate pain.   -Continue Nurtec every other day- may use for breakthrough migraines as well.  -Discussed plan for Vypeti at [facility] infusion center.  Discussed that exercise is one of the most effective treatments for fibromyalgia. This will also help with her obesity. Made goal with Dana Bradley to walk outside her home at least once per day, and to garden at least once per day (her favorite activity). Can use elliptical which she has at home on rainy days. The heat has been oppressive and so  she has been trying to do the latter more.  Foods to alleviate migraine:  1) dark leafy greens 2) avocado 3) tuna 4) samon and mackerel 5) beans and legumes Supplements that can be helpful: feverfew, B12, and magnesium  Foods to avoid in migraine: 1) Excessive (or irregular timing) coffee 2) red wine 3) aged cheeses 4) chocolate 5) citrus fruits 6) aspartame and other artifical sweeteners 7) yeast 8) MSG (in processed foods) 9) processed and cured meats 10) nuts and certain seeds 11) chicken livers and other organ meats 12) dairy products like buttermilk, sour cream, and yogurt 13) dried fruits like dates, figs, and raisins 14) garlic 15) onions 16) potato chips 17) pickled foods like olives and sauerkraut 18) some fresh fruits like ripe banana, papaya, red plums, raspberries, kiwi, pineapple 19) tomato-based products  Recommend to keep a migraine diary: rate daily the severity of your headache (1-10) and what foods you eat that day to help determine patterns.    3) Cervical facet arthrosis: Cervical XR normal- discussed results with patient. Pain is worse on left side.   4) Anxiety and depression -Her daughter was recently diagnosed with sarcoma and this is a great source of stress and fear to Dana Bradley. It has been a turbulent time for  her and this has understandably worsened her symptoms. Discussed benefits of gratitude journaling and she plans to try this.  -discussed her life stressor, her mom's illness -discussed her difficult wean off Pristiq -prescribed zoloft -stop Wellbutrin -prescribed vitamin D -recommended Bi[employer]em[employer]'s magnesium breakthrough -recommended methylated multivitamin -discussed following with Dr. Kieth Brightly.  -increase amitriptyline to 150mg  HS  5) Insomnia:  -continue amitirptyline to 50mg .  -Try to go outside near sunrise -Get exercise during the day.  -Discussed good sleep hygiene: turning off all devices an hour before bedtime.  -Chamomile tea with dinner.  -Can consider over the counter melatonin -discussed magnesium  6) Nausea:  -continue zofran, discussed side effect of constipation -start B6 -discussed scopolamine patch, discussed to place behind ear and lave on 72 hours.    7) Cervical myofascial pain syndrome: Will try some trigger point injections with occipital nerve block next visit.  -increase amitriptyline to 100mg  HS  8) Concern for small bowel obstruction: -stat abdominal MRI ordered given no BM since 1/9.  -recommended to go to [facility] to get this done stat -discussed that it would be beneficial to go to the ED.   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 Wynn Banker for [facility] -discussed that she has been told she is no longer a candidate for injections, or surgery of spinal cord stimulator -Discussed Qutenza as an option for neuropathic pain control. Discussed that this is a capsaicin patch, stronger than capsaicin cream. Discussed that it is currently approved for diabetic peripheral neuropathy and post-herpetic neuralgia, but that it has  also shown benefit in treating other forms of neuropathy. Provided patient with link to site to learn more about the patch: https://www.clark.biz/. Discussed that the patch would be placed in office and benefits usually last 3 months. Discussed that unintended exposure to capsaicin can cause severe irritation of eyes, mucous membranes, respiratory tract, and skin, but that Qutenza is a local treatment and does not have the systemic side effects of other nerve medications. Discussed that there may be pain,  itching, erythema, and decreased sensory function associated with the application of Qutenza. Side effects usually subside within 1 week. A cold pack of analgesic medications can help with these side effects. Blood pressure can also be increased due to pain associated with administration of the patch.  unchanged. Shallow broad-based disc protrusion L4-5 unchanged. -continue Norco -continue turmeric Turmeric to reduce inflammation--can be used in cooking or taken as a supplement.  Benefits of turmeric:  -Highly anti-inflammatory  -Increases antioxidants  -Improves memory, attention, brain disease  -Lowers risk of heart disease  -May help prevent cancer  -Decreases pain  -Alleviates depression  -Delays aging and decreases risk of chronic disease  -Consume with black pepper to increase absorption    Turmeric Milk Recipe:  1 cup milk  1 tsp turmeric  1 tsp cinnamon  1 tsp grated ginger (optional)  Black pepper (boosts the anti-inflammatory properties of turmeric).  1 tsp honey   12) Stress: -discussed the stress from Fiona's recent diagnosis and her mother's health decline -discussed that her daughter is not sleeping well  8 minutes spent in discussion of her migraines, her food allergy testing results

## 2023-03-04 ENCOUNTER — Other Ambulatory Visit: Payer: Self-pay | Admitting: Physical Medicine and Rehabilitation

## 2023-03-04 ENCOUNTER — Other Ambulatory Visit: Payer: Self-pay

## 2023-03-04 ENCOUNTER — Other Ambulatory Visit (HOSPITAL_COMMUNITY): Payer: Self-pay

## 2023-03-04 MED ORDER — VYEPTI 100 MG/ML IV SOLN
300.0000 mg | INTRAVENOUS | 3 refills | Status: DC
  Filled ????-??-??: fill #0

## 2023-03-05 ENCOUNTER — Other Ambulatory Visit (HOSPITAL_COMMUNITY): Payer: Self-pay

## 2023-03-05 ENCOUNTER — Other Ambulatory Visit: Payer: Self-pay

## 2023-03-05 MED ORDER — VYEPTI 100 MG/ML IV SOLN
300.0000 mg | INTRAVENOUS | 3 refills | Status: DC
  Filled 2023-03-06: qty 3, 34d supply, fill #0
  Filled 2023-06-03: qty 3, 34d supply, fill #1

## 2023-03-05 NOTE — Telephone Encounter (Signed)
Please review the quantity on rx with new dose. Pharmacy inquiring.

## 2023-03-06 ENCOUNTER — Other Ambulatory Visit (HOSPITAL_COMMUNITY): Payer: Self-pay

## 2023-03-06 ENCOUNTER — Other Ambulatory Visit: Payer: Self-pay

## 2023-03-11 ENCOUNTER — Other Ambulatory Visit: Payer: Self-pay | Admitting: Otolaryngology

## 2023-03-11 DIAGNOSIS — K219 Gastro-esophageal reflux disease without esophagitis: Secondary | ICD-10-CM

## 2023-03-11 DIAGNOSIS — R1310 Dysphagia, unspecified: Secondary | ICD-10-CM

## 2023-03-11 DIAGNOSIS — E041 Nontoxic single thyroid nodule: Secondary | ICD-10-CM

## 2023-03-11 NOTE — Progress Notes (Unsigned)
PROVIDER NOTE: Information contained herein reflects review and annotations entered in association with encounter. Interpretation of such information and data should be left to medically-trained personnel. Information provided to patient can be located elsewhere in the medical record under "Patient Instructions". Document created using STT-dictation technology, any transcriptional errors that may result from process are unintentional.    Patient: Dana Bradley  Service Category: E/M  Provider: Oswaldo Done, MD  DOB: [birth year]  DOS: 03/12/2023  Referring Provider: Sander Radon  MRN: [id number]  Specialty: Interventional Pain Management  PCP: Danelle Berry, PA-C  Type: Established Patient  Setting: Ambulatory outpatient    Location: Office  Delivery: Face-to-face     HPI  Ms. Sayde Lish, a 52 y.o. year old female, is here today because of her No primary diagnosis found.. Ms. Loflin's primary complain today is No chief complaint on file.  Pertinent problems: Ms. Fullenwider has Cervico-occipital neuralgia; DDD (degenerative disc disease), lumbosacral; Chronic pain of multiple joints; DDD (degenerative disc disease), lumbar; Fibromyalgia; Migraine without aura and with status migrainosus, not intractable; Chronic lower extremity cramps (Bilateral) (R>L); Chronic pain syndrome; Neurogenic pain; Chronic low back pain (1ry area of Pain) (Bilateral) (R>L) (midline) w/ sciatica (Bilateral); Chronic upper back pain (2ry area of Pain) (Bilateral) (L>R); Chronic abdominal pain (Right lower quadrant); Thoracic radiculitis (Bilateral: T10, T11); Chronic occipital neuralgia (3ry area of Pain) (Bilateral) (L>R); Chronic neck pain; Chronic cervical radicular pain (Bilateral) (L>R); Chronic shoulder blade pain (Bilateral) (L>R); Chronic upper extremity pain (Bilateral) (R>L); Chronic knee pain (Bilateral) (R>L); Chronic ankle pain (Bilateral); Cervical spondylosis with myelopathy and radiculopathy; Nephrolithiasis; Plantar  fasciitis of left foot; Cervicogenic headache; Bilateral leg edema; Chronic hip pain (Left); Chronic sacroiliac joint pain (Left); Lumbar facet syndrome (Bilateral) (L>R); Lumbar radiculitis (Left); Lumbar spondylosis; Migraine headache; Muscle spasticity; Osteoarthritis of shoulder (Bilateral); Lumbar L1-2 disc protrusion (Right); Myofascial pain syndrome (Left) (trapezius muscle); Trigger point of shoulder region (Left); Chronic fatigue syndrome with fibromyalgia; Occipital headache; Trigger point of neck (Left); Malar rash; Chronic migraine without aura, with intractable migraine, so stated, with status migrainosus; Weakness of leg (Left); Difficulty walking; Migraine with aura and with status migrainosus, not intractable; Cervico-occipital neuralgia (Left); DDD (degenerative disc disease), cervical; Cervical facet syndrome (Bilateral) (L>R); DDD (degenerative disc disease), thoracic; Osteoarthritis of hip (Left); Chronic groin pain (Bilateral) (L>R); Chronic hip pain (Bilateral) (L>R); Somatic dysfunction of sacroiliac joint (Bilateral); Other specified dorsopathies, sacral and sacrococcygeal region; Intractable migraine with aura without status migrainosus; Chronic lower extremity pain (Bilateral); Lumbar radiculitis (Right); Neurogenic urinary incontinence; Other intervertebral disc degeneration, lumbar region; Spinal stenosis of lumbosacral region; Hyperalgesia; Abnormal MRI, lumbar spine (12/27/2021); Chronic midline thoracic back pain; Abnormal MRI, thoracic spine (08/02/2019); Thoracic spinal stenosis (T7-8); Prolapse of thoracic disc with radiculopathy (T7-8); Spasm of muscle of lower back; Cervicalgia; Abnormal MRI, cervical spine (02/14/2021); Lateral epicondylitis, left elbow; Neurogenic bladder; Urinary and fecal incontinence; and Herniated nucleus pulposus, L3-4 (Right) on their pertinent problem list. Pain Assessment: Severity of   is reported as a  /10. Location:    / . Onset:  . Quality:  .  Timing:  . Modifying factor(s):  Marland Kitchen Vitals:  vitals were not taken for this visit.  BMI: Estimated body mass index is 40.12 kg/m as calculated from the following:   Height as of 02/13/23: 5\' 3"  (1.6 m).   Weight as of 02/13/23: 226 lb 8 oz (102.7 kg). Last encounter: 12/11/2022. Last procedure: 10/29/2022.  Reason for encounter:  *** . ***  Pharmacotherapy Assessment  Analgesic:  Hydrocodone/APAP 7.5/325, 1 tab PO QD. she appears to be using less than 10 pills/month. MME/day: 3.75 mg/day.   Monitoring: [place] PMP: PDMP reviewed during this encounter.       Pharmacotherapy: No side-effects or adverse reactions reported. Compliance: No problems identified. Effectiveness: Clinically acceptable.  No notes on file  No results found for: "CBDTHCR" No results found for: "D8THCCBX" No results found for: "D9THCCBX"  UDS:  Summary  Date Value Ref Range Status  01/28/2022 Note  Final    Comment:    ==================================================================== [employer] [facility] 13 (MW) ==================================================================== Test                             Result       Flag       Units  Drug Present and Declared for Prescription Verification   Hydromorphone                  38           EXPECTED   ng/mg creat    Hydromorphone may be administered as a scheduled prescription    medication; it is also an expected metabolite of hydrocodone.  Drug Absent but Declared for Prescription Verification   Lorazepam                      Not Detected UNEXPECTED ng/mg creat   Hydrocodone                    Not Detected UNEXPECTED ng/mg creat    Hydrocodone is almost always present in patients taking this drug    consistently. Absence of hydrocodone could be due to lapse of time    since the last dose or unusual pharmacokinetics (rapid metabolism).    Butalbital                     Not Detected  UNEXPECTED ==================================================================== Test                      Result    Flag   Units      Ref Range   Creatinine              135              mg/dL      >=20 ==================================================================== Declared Medications:  The flagging and interpretation on this report are based on the  following declared medications.  Unexpected results may arise from  inaccuracies in the declared medications.   **Note: The testing scope of this panel includes these medications:   Butalbital  Hydrocodone (Norco)  Lorazepam (Ativan)   **Note: The testing scope of this panel does not include the  following reported medications:   Acetaminophen  Acetaminophen (Norco)  Amitriptyline (Elavil)  Atorvastatin (Lipitor)  Baclofen (Lioresal)  Caffeine  Carbamazepine (Tegretol)  Diclofenac (Voltaren)  Dicyclomine (Bentyl)  Dihydroergotamine (Migranal)  Meclizine (Antivert)  Medroxyprogesterone (Provera)  Metformin  Metoprolol  Omeprazole  Ondansetron (Zofran)  Pancrelipase (Creon)  Pregabalin (Lyrica)  Rimegepant (Nurtec)  Vitamin B12 ==================================================================== For clinical consultation, please call [phone]. ====================================================================       ROS  Constitutional: Denies any fever or chills Gastrointestinal: No reported hemesis, hematochezia, vomiting, or acute GI distress Musculoskeletal: Denies any acute onset joint swelling, redness, loss of ROM, or weakness Neurological: No reported episodes of acute onset apraxia, aphasia, dysarthria, agnosia, amnesia, paralysis, loss of  coordination, or loss of consciousness  Medication Review  Atogepant, Eptinezumab-jjmr, HYDROcodone-acetaminophen, LORazepam, Needles & Syringes, Rimegepant Sulfate, Valerian, Vitamin D (Ergocalciferol), amitriptyline, atorvastatin, baclofen,  butalbital-acetaminophen-caffeine, cyanocobalamin, diclofenac Sodium, dicyclomine, linaclotide, lipase/protease/amylase, meclizine, medroxyPROGESTERone, metFORMIN, metoprolol succinate, naloxone, ondansetron, pantoprazole, pregabalin, rizatriptan, scopolamine, sertraline, and triamcinolone cream  History Review  Allergy: Ms. Stepien is allergic to aspirin, cephalexin, cymbalta [duloxetine hcl], depakote [divalproex sodium], gadolinium derivatives, haloperidol, meperidine, metoclopramide, morphine, penicillins, prochlorperazine, tramadol hcl, trazodone, meloxicam, neomycin-bacitracin zn-polymyx, tomato, egg-derived products, milk-related compounds, other, shellfish allergy, shellfish-derived products, bacitra-neomycin-polymyxin-hc, bacitracin-neomycin-polymyxin, cephalosporins, ibuprofen, latex, nsaids, sulfa antibiotics, and sulfonamide derivatives. Drug: Ms. Dorch  reports no history of drug use. Alcohol:  reports no history of alcohol use. Tobacco:  reports that she quit smoking about 30 years ago. Her smoking use included cigarettes. She has a 6.00 pack-year smoking history. She has never used smokeless tobacco. Social: Ms. Sleiman  reports that she quit smoking about 30 years ago. Her smoking use included cigarettes. She has a 6.00 pack-year smoking history. She has never used smokeless tobacco. She reports that she does not drink alcohol and does not use drugs. Medical:  has a past medical history of Acute postoperative pain (04/07/2017), Anxiety, Bursitis, Chronic fatigue (12/12/2017), Chronic fatigue syndrome, Colitis (2021), Diabetes mellitus without complication (HCC), Edema leg (05/02/2015), Fibromyalgia, GERD (gastroesophageal reflux disease), IBS (irritable bowel syndrome), Knee pain, bilateral (12/21/2008), Lumbar discitis, Migraines, Osteoarthritis, Right hand pain (04/10/2015), Sleep apnea, Spinal stenosis, SVT (supraventricular tachycardia), Vertigo, and Vitamin D deficiency  (05/01/2016). Surgical: Ms. Medler  has a past surgical history that includes Ablation; Tubal ligation (10/01/1999); Cardiac catheterization; Knee arthroscopy; Colonoscopy with propofol (N/A, 05/17/2015); Esophagogastroduodenoscopy (N/A, 05/17/2015); spg; Tibial Tubercle Bypass (Right, 1998); Colonoscopy with propofol (N/A, 03/20/2020); Breast biopsy (Left); Colonoscopy with propofol (N/A, 09/25/2022); Esophagogastroduodenoscopy (N/A, 09/25/2022); Colonoscopy (N/A, 09/26/2022); Esophagogastroduodenoscopy (N/A, 09/26/2022); Colonoscopy with propofol (N/A, 10/02/2022); and Colonoscopy with propofol (N/A, 11/13/2022). Family: family history includes Alcohol abuse in her father; Alzheimer's disease in her paternal grandmother and another family member; Aneurysm in her maternal grandfather; Anxiety disorder in her father, mother, sister, and sister; Arthritis in her maternal grandmother; Bipolar disorder in her sister; Breast cancer (age of onset: 59) in her maternal grandmother; COPD in her paternal grandfather; Cancer in her mother; Cancer (age of onset: 79) in her maternal grandmother; Depression in her father, mother, sister, and sister; Diabetes in her sister; Heart attack in her paternal grandfather; Heart disease in her father, maternal grandfather, and paternal grandfather; Hyperlipidemia in her maternal grandmother, mother, and sister; Hypertension in her father, maternal grandfather, mother, paternal grandfather, sister, and sister; Migraines in her daughter, daughter, mother, sister, sister, and son; Other in her daughter; Polycystic ovary syndrome in her sister; Stroke in her father; Thyroid disease in her maternal grandmother.  Laboratory Chemistry Profile   Renal Lab Results  Component Value Date   BUN 12 02/13/2023   CREATININE 0.78 02/13/2023   LABCREA 44 02/13/2023   BCR SEE NOTE: 02/13/2023   GFRAA >60 02/22/2020   GFRNONAA >60 07/06/2021    Hepatic Lab Results  Component Value Date   AST  20 02/13/2023   ALT 28 02/13/2023   ALBUMIN 4.2 07/06/2021   ALKPHOS 109 07/06/2021   HCVAB NON REACTIVE 09/21/2019   LIPASE 37 09/06/2022    Electrolytes Lab Results  Component Value Date   NA 140 02/13/2023   K 4.1 02/13/2023   CL 104 02/13/2023   CALCIUM 9.5 02/13/2023   MG 2.1 04/01/2017    Bone Lab Results  Component Value Date   VD25OH 32 02/13/2023   25OHVITD1 20 (L) 04/01/2017   25OHVITD2 5.4 04/01/2017   25OHVITD3 15 04/01/2017    Inflammation (CRP: Acute Phase) (ESR: Chronic Phase) Lab Results  Component Value Date   CRP 6.0 07/30/2018   ESRSEDRATE 17 07/30/2018         Note: Above Lab results reviewed.  Recent Imaging Review  [place] FINE NEEDLE ASP 1ST LESION INDICATION: Patient with symptomatic right thyroid cyst.  EXAM: Thyroid cyst aspiration  MEDICATIONS: 1% lidocaine 3 mL  ANESTHESIA/SEDATION: None  COMPLICATIONS: None immediate.  PROCEDURE: Informed written consent was obtained from the patient after a thorough discussion of the procedural risks, benefits and alternatives. The patient was informed that the thyroid cyst previously measured 3.3 cm on 12/22/2022 thyroid ultrasound. Ultrasound imaging today demonstrates the thyroid cyst had decreased in size to 1.3 cm. The patient stated she was still symptomatic and requested aspiration.  All questions were addressed. Maximal Sterile Barrier Technique was utilized including caps, mask, sterile gowns, sterile gloves, sterile drape, hand hygiene and skin antiseptic. A timeout was performed prior to the initiation of the procedure.  Under ultrasound guidance the site was anesthetized with 1% lidocaine with a 25 gauge needle. When the needle reached the thyroid cyst the lidocaine syringe was disconnected and a sterile 10 mL syringe was attached. Scant fluid aspirated. An 18 gauge needle was then guided into the cyst with approximately 0.25 mL of brown serous fluid removed.  IMPRESSION: 1.  Interval reduction in size of right-sided thyroid cyst, previously, 3.3 cm, presently, 1.3 cm the patient is still requesting attempted aspiration for therapeutic purposes. 2. Technically successful thyroid cyst aspiration yielding 0.25 mL of brown serous fluid. Fluid will be sent for cytology.  Procedure performed by Alwyn Ren, NP  Electronically Signed   By: Simonne Come M.D.   On: 01/28/2023 15:59 Note: Reviewed        Physical Exam  General appearance: Well nourished, well developed, and well hydrated. In no apparent acute distress Mental status: Alert, oriented x 3 (person, place, & time)       Respiratory: No evidence of acute respiratory distress Eyes: PERLA Vitals: LMP 10/22/2021 (Approximate)  BMI: Estimated body mass index is 40.12 kg/m as calculated from the following:   Height as of 02/13/23: 5\' 3"  (1.6 m).   Weight as of 02/13/23: 226 lb 8 oz (102.7 kg). Ideal: Patient weight not recorded  Assessment   Diagnosis Status  No diagnosis found. Controlled Controlled Controlled   Updated Problems: No problems updated.  Plan of Care  Problem-specific:  No problem-specific Assessment & Plan notes found for this encounter.  Ms. Lincoln Shew has a current medication list which includes the following long-term medication(s): amitriptyline, atorvastatin, baclofen, diclofenac sodium, dicyclomine, dicyclomine, hydrocodone-acetaminophen, hydrocodone-acetaminophen, hydrocodone-acetaminophen, linaclotide, medroxyprogesterone, metformin, metoprolol succinate, needles & syringes, pregabalin, rizatriptan, and sertraline.  Pharmacotherapy (Medications Ordered): No orders of the defined types were placed in this encounter.  Orders:  No orders of the defined types were placed in this encounter.  Follow-up plan:   No follow-ups on file.      Interventional Therapies  Risk Factors  Considerations:   Morbid obesity Allergy to contrast. Allergy to latex. Allergy to  NSAIDs. History of vasovagal episodes with spinal manipulation. FCE completed (06/28/2020) - Minimal Level = Sedentary (8hr/day; 40hr/wk)   Planned  Pending:   Diagnostic/therapeutic right L4-5 LESI #1    Under consideration:   Diagnostic/therapeutic right L3-4 TFESI #2  Diagnostic/therapeutic  midline L1-2 LESI #3  Not a candidate for a spinal cord stimulator secondary to T7-8 disc protrusion and central spinal stenosis with moderate cord flattening.  (03/29/2022 thoracic MRI)  Therapeutic left cervical ESI #4  Therapeutic right L4-5 LESI #1    Completed:   Diagnostic/therapeutic right L3 TFESI x1 (10/29/2022) (0/50/100/80)  Neurosurgical referral for decompressive laminectomy and/or discectomy Therapeutic left C7-T1 cervical ESI x3 (02/13/2021) (did not keep F/U.  Does not remember.) Therapeutic bilateral lumbar facet MBB x1 (01/06/2017)  Therapeutic left SI joint block x2 (08/03/2019) (80/30/80/20)  Therapeutic right L1-2 LESI x1 (12/16/2017)  Therapeutic left L1-2 LESI x1 (04/07/2017)  Therapeutic bilateral L2 TFESI x2 (11/09/2020) (did not keep F/U.  Does not remember.) Therapeutic midline T8-9 thoracic ESI x2 (11/09/2020) (did not keep F/U.  Does not remember.) Therapeutic left L4-5 LESI x1 (09/07/2019) (50/50/50/50)  Therapeutic left trapezius muscle TPI/MNB x3 (03/19/2018) (100/80/90/>75)  Therapeutic right trapezius muscle trigger point injection x1 (11/18/2017)  Therapeutic left occipital nerve RFA x1 (12/25/2017)  Therapeutic left C2 + TON RFA x1 (12/25/2017)  Therapeutic left caudal ESI x1 (09/28/2019) (0/0/0/30)  Therapeutic right T8-9 thoracic ESI x1 (11/09/2020)  It would appear that the last therapeutic left C7-T1 cervical ESI #3 (02/13/2021) did not provide her with any significant relief of the pain.  However, her very first cervical epidural steroid injection was done on 09/11/2016 and again caused a flareup of her pain.  Follow-up assessment indicated 80/60/70/> 50.  The procedure  was then repeated on 10/30/2016 and although the patient did not keep up with the follow-up on that procedure, it would seem that her pain got under control and did not need any further interventions in the cervical region until 12/25/2017 when she had a left occipital nerve (Left) C2/TON RFA.  Unfortunately, the patient is rather noncompliant with follow-up evaluations after diagnostic and therapeutic procedures and therefore the information collected on those results is not available to contribute towards obtaining better results.   Therapeutic  Palliative (PRN) options:      Pharmacotherapy  Nonopioid transferred 11/08/2020: Lyrica, baclofen        Recent Visits Date Type Provider Dept  12/11/22 Office Visit Delano Metz, MD [facility]  Showing recent visits within past 90 days and meeting all other requirements Future Appointments Date Type Provider Dept  03/12/23 Appointment Delano Metz, MD [facility]  Showing future appointments within next 90 days and meeting all other requirements  I discussed the assessment and treatment plan with the patient. The patient was provided an opportunity to ask questions and all were answered. The patient agreed with the plan and demonstrated an understanding of the instructions.  Patient advised to call back or seek an in-person evaluation if the symptoms or condition worsens.  Duration of encounter: *** minutes.  Total time on encounter, as per AMA guidelines included both the face-to-face and non-face-to-face time personally spent by the physician and/or other qualified health care professional(s) on the day of the encounter (includes time in activities that require the physician or other qualified health care professional and does not include time in activities normally performed by clinical staff). Physician's time may include the following activities when performed: Preparing to see the patient (e.g.,  pre-charting review of records, searching for previously ordered imaging, lab work, and nerve conduction tests) Review of prior analgesic pharmacotherapies. Reviewing PMP Interpreting ordered tests (e.g., lab work, imaging, nerve conduction tests) Performing post-procedure evaluations, including interpretation of diagnostic procedures Obtaining and/or reviewing separately obtained history  Performing a medically appropriate examination and/or evaluation Counseling and educating the patient/family/caregiver Ordering medications, tests, or procedures Referring and communicating with other health care professionals (when not separately reported) Documenting clinical information in the electronic or other health record Independently interpreting results (not separately reported) and communicating results to the patient/ family/caregiver Care coordination (not separately reported)  Note by: Oswaldo Done, MD Date: 03/12/2023; Time: 11:14 AM

## 2023-03-12 ENCOUNTER — Ambulatory Visit (HOSPITAL_BASED_OUTPATIENT_CLINIC_OR_DEPARTMENT_OTHER): Payer: Medicaid Other | Admitting: Pain Medicine

## 2023-03-12 ENCOUNTER — Other Ambulatory Visit (HOSPITAL_COMMUNITY): Payer: Self-pay

## 2023-03-12 ENCOUNTER — Ambulatory Visit: Payer: Medicaid Other | Admitting: Podiatry

## 2023-03-12 ENCOUNTER — Other Ambulatory Visit: Payer: Self-pay

## 2023-03-12 DIAGNOSIS — Z79891 Long term (current) use of opiate analgesic: Secondary | ICD-10-CM | POA: Insufficient documentation

## 2023-03-12 DIAGNOSIS — Z91199 Patient's noncompliance with other medical treatment and regimen due to unspecified reason: Secondary | ICD-10-CM

## 2023-03-12 DIAGNOSIS — Z79899 Other long term (current) drug therapy: Secondary | ICD-10-CM | POA: Insufficient documentation

## 2023-03-12 NOTE — Patient Instructions (Signed)
____________________________________________________________________________________________  Opioid Pain Medication Update  To: All patients taking opioid pain medications. (I.e.: hydrocodone, hydromorphone, oxycodone, oxymorphone, morphine, codeine, methadone, tapentadol, tramadol, buprenorphine, fentanyl, etc.)  Re: Updated review of side effects and adverse reactions of opioid analgesics, as well as new information about long term effects of this class of medications.  Direct risks of long-term opioid therapy are not limited to opioid addiction and overdose. Potential medical risks include serious fractures, breathing problems during sleep, hyperalgesia, immunosuppression, chronic constipation, bowel obstruction, myocardial infarction, and tooth decay secondary to xerostomia.  Unpredictable adverse effects that can occur even if you take your medication correctly: Cognitive impairment, respiratory depression, and death. Most people think that if they take their medication "correctly", and "as instructed", that they will be safe. Nothing could be farther from the truth. In reality, a significant amount of recorded deaths associated with the use of opioids has occurred in individuals that had taken the medication for a long time, and were taking their medication correctly. The following are examples of how this can happen: Patient taking his/her medication for a long time, as instructed, without any side effects, is given a certain antibiotic or another unrelated medication, which in turn triggers a "Drug-to-drug interaction" leading to disorientation, cognitive impairment, impaired reflexes, respiratory depression or an untoward event leading to serious bodily harm or injury, including death.  Patient taking his/her medication for a long time, as instructed, without any side effects, develops an acute impairment of liver and/or kidney function. This will lead to a rapid inability of the body to  breakdown and eliminate their pain medication, which will result in effects similar to an "overdose", but with the same medicine and dose that they had always taken. This again may lead to disorientation, cognitive impairment, impaired reflexes, respiratory depression or an untoward event leading to serious bodily harm or injury, including death.  A similar problem will occur with patients as they grow older and their liver and kidney function begins to decrease as part of the aging process.  Background information: Historically, the original case for using long-term opioid therapy to treat chronic noncancer pain was based on safety assumptions that subsequent experience has called into question. In 1996, the American Pain Society and the American Academy of Pain Medicine issued a consensus statement supporting long-term opioid therapy. This statement acknowledged the dangers of opioid prescribing but concluded that the risk for addiction was low; respiratory depression induced by opioids was short-lived, occurred mainly in opioid-naive patients, and was antagonized by pain; tolerance was not a common problem; and efforts to control diversion should not constrain opioid prescribing. This has now proven to be wrong. Experience regarding the risks for opioid addiction, misuse, and overdose in community practice has failed to support these assumptions.  According to the Centers for Disease Control and Prevention, fatal overdoses involving opioid analgesics have increased sharply over the past decade. Currently, more than 96,700 people die from drug overdoses every year. Opioids are a factor in 7 out of every 10 overdose deaths. Deaths from drug overdose have surpassed motor vehicle accidents as the leading cause of death for individuals between the ages of 35 and 54.  Clinical data suggest that neuroendocrine dysfunction may be very common in both men and women, potentially causing hypogonadism, erectile  dysfunction, infertility, decreased libido, osteoporosis, and depression. Recent studies linked higher opioid dose to increased opioid-related mortality. Controlled observational studies reported that long-term opioid therapy may be associated with increased risk for cardiovascular events. Subsequent meta-analysis concluded   that the safety of long-term opioid therapy in elderly patients has not been proven.   Side Effects and adverse reactions: Common side effects: Drowsiness (sedation). Dizziness. Nausea and vomiting. Constipation. Physical dependence -- Dependence often manifests with withdrawal symptoms when opioids are discontinued or decreased. Tolerance -- As you take repeated doses of opioids, you require increased medication to experience the same effect of pain relief. Respiratory depression -- This can occur in healthy people, especially with higher doses. However, people with COPD, asthma or other lung conditions may be even more susceptible to fatal respiratory impairment.  Uncommon side effects: An increased sensitivity to feeling pain and extreme response to pain (hyperalgesia). Chronic use of opioids can lead to this. Delayed gastric emptying (the process by which the contents of your stomach are moved into your small intestine). Muscle rigidity. Immune system and hormonal dysfunction. Quick, involuntary muscle jerks (myoclonus). Arrhythmia. Itchy skin (pruritus). Dry mouth (xerostomia).  Long-term side effects: Chronic constipation. Sleep-disordered breathing (SDB). Increased risk of bone fractures. Hypothalamic-pituitary-adrenal dysregulation. Increased risk of overdose.  RISKS: Fractures and Falls:  Opioids increase the risk and incidence of falls. This is of particular importance in elderly patients.  Endocrine System:  Long-term administration is associated with endocrine abnormalities (endocrinopathies). (Also known as Opioid-induced Endocrinopathy) Influences  on both the hypothalamic-pituitary-adrenal axis?and the hypothalamic-pituitary-gonadal axis have been demonstrated with consequent hypogonadism and adrenal insufficiency in both sexes. Hypogonadism and decreased levels of dehydroepiandrosterone sulfate have been reported in men and women. Endocrine effects include: Amenorrhoea in women (abnormal absence of menstruation) Reduced libido in both sexes Decreased sexual function Erectile dysfunction in men Hypogonadisms (decreased testicular function with shrinkage of testicles) Infertility Depression and fatigue Loss of muscle mass Anxiety Depression Immune suppression Hyperalgesia Weight gain Anemia Osteoporosis Patients (particularly women of childbearing age) should avoid opioids. There is insufficient evidence to recommend routine monitoring of asymptomatic patients taking opioids in the long-term for hormonal deficiencies.  Immune System: Human studies have demonstrated that opioids have an immunomodulating effect. These effects are mediated via opioid receptors both on immune effector cells and in the central nervous system. Opioids have been demonstrated to have adverse effects on antimicrobial response and anti-tumour surveillance. Buprenorphine has been demonstrated to have no impact on immune function.  Opioid Induced Hyperalgesia: Human studies have demonstrated that prolonged use of opioids can lead to a state of abnormal pain sensitivity, sometimes called opioid induced hyperalgesia (OIH). Opioid induced hyperalgesia is not usually seen in the absence of tolerance to opioid analgesia. Clinically, hyperalgesia may be diagnosed if the patient on long-term opioid therapy presents with increased pain. This might be qualitatively and anatomically distinct from pain related to disease progression or to breakthrough pain resulting from development of opioid tolerance. Pain associated with hyperalgesia tends to be more diffuse than the  pre-existing pain and less defined in quality. Management of opioid induced hyperalgesia requires opioid dose reduction.  Cancer: Chronic opioid therapy has been associated with an increased risk of cancer among noncancer patients with chronic pain. This association was more evident in chronic strong opioid users. Chronic opioid consumption causes significant pathological changes in the small intestine and colon. Epidemiological studies have found that there is a link between opium dependence and initiation of gastrointestinal cancers. Cancer is the second leading cause of death after cardiovascular disease. Chronic use of opioids can cause multiple conditions such as GERD, immunosuppression and renal damage as well as carcinogenic effects, which are associated with the incidence of cancers.   Mortality: Long-term opioid use   has been associated with increased mortality among patients with chronic non-cancer pain (CNCP).  Prescription of long-acting opioids for chronic noncancer pain was associated with a significantly increased risk of all-cause mortality, including deaths from causes other than overdose.  Reference: Von Korff M, Kolodny A, Deyo RA, Chou R. Long-term opioid therapy reconsidered. Ann Intern Med. 2011 Sep 6;155(5):325-8. doi: 10.7326/[id number]. PMID: 16109604; PMCID: [id number]. Randon Goldsmith, Hayward RA, Dunn KM, [place] KP. Risk of adverse events in patients prescribed long-term opioids: A cohort study in the [place] [facility] Research Datalink. Eur J Pain. 2019 May;23(5):908-922. doi: 10.1002/ejp.1357. Epub 2019 Jan 31. PMID: 78295621. Colameco S, Coren JS, Ciervo CA. Continuous opioid treatment for chronic noncancer pain: a time for moderation in prescribing. Postgrad Med. 2009 Jul;121(4):61-6. doi: 10.3810/pgm.2009.07.2032. PMID: 30865784. William Hamburger RN, Durand SD, Blazina I, Cristopher Peru, Bougatsos C, Deyo RA. The  effectiveness and risks of long-term opioid therapy for chronic pain: a systematic review for a [employer] of [employer] Pathways to [employer]. Ann Intern Med. 2015 Feb 17;162(4):276-86. doi: 10.7326/M14-2559. PMID: 69629528. Caryl Bis [facility], [place] DM. NCHS Data Brief No. 22. [place]: Centers for Disease Control and Prevention; 2009. Sep, Increase in Fatal Poisonings Involving Opioid Analgesics in the [place], 1999-2006. Song IA, Choi HR, Oh TK. Long-term opioid use and mortality in patients with chronic non-cancer pain: Ten-year follow-up study in [place] from 2010 through 2019. EClinicalMedicine. 2022 Jul 18;51:101558. doi: 10.1016/j.eclinm.2022.413244. PMID: 01027253; PMCID: [id number]. Huser, W., Schubert, T., Vogelmann, T. et al. All-cause mortality in patients with long-term opioid therapy compared with non-opioid analgesics for chronic non-cancer pain: a database study. BMC Med 18, 162 (2020). http://lester.info/ Rashidian H, Karie Kirks, Malekzadeh R, Haghdoost AA. An Ecological Study of the Association between Opiate Use and Incidence of Cancers. Addict Health. 2016 Fall;8(4):252-260. PMID: 59563875; PMCID: [id number].  Our Goal: Our goal is to control your pain with means other than the use of opioid pain medications.  Our Recommendation: Talk to your physician about coming off of these medications. We can assist you with the tapering down and stopping these medicines. Based on the new information, even if you cannot completely stop the medication, a decrease in the dose may be associated with a lesser risk. Ask for other means of controlling the pain. Decrease or eliminate those factors that significantly contribute to your pain such as smoking, obesity, and a diet heavily tilted towards "inflammatory" nutrients.  Last Updated: 11/27/2022    ____________________________________________________________________________________________     ____________________________________________________________________________________________  Transfer of Pain Medication between Pharmacies  Re: 2023 DEA Clarification on existing regulation  Published on DEA Website: May 31, 2022  Title: Revised Regulation Allows DEA-Registered Pharmacies to Electrical engineer Prescriptions at a Patient's Request DEA Headquarters Division - [employer]  "Patients now have the ability to request their electronic prescription be transferred to another pharmacy without having to go back to their practitioner to initiate the request. This revised regulation went into effect on Monday, May 27, 2022.     At a patient's request, a DEA-registered retail pharmacy can now transfer an electronic prescription for a controlled substance (schedules II-V) to another DEA-registered retail pharmacy. Prior to this change, patients would have to go through their practitioner to cancel their prescription and have it re-issued to a different pharmacy. The process was taxing and time consuming for both patients and practitioners.    The Drug Enforcement Administration [facility]) published its intent to revise the  process for transferring electronic prescriptions on August 18, 2020.  The final rule was published in the federal register on April 25, 2022 and went into effect 30 days later.  Under the final rule, a prescription can only be transferred once between pharmacies, and only if allowed under existing state or other applicable law. The prescription must remain in its electronic form; may not be altered in any way; and the transfer must be communicated directly between two licensed pharmacists. It's important to note, any authorized refills transfer with the original prescription, which means the entire prescription will be filled at the same pharmacy."     REFERENCES: 1. DEA website announcement HugeHand.is  2. Department of Justice website  CheapWipes.at.pdf  3. DEPARTMENT OF JUSTICE Drug Enforcement Administration 21 CFR Part 1306 [Docket No. [id number]-637] RIN [id number] "Transfer of Electronic Prescriptions for Schedules II-V Controlled Substances Between Pharmacies for Initial Filling"  ____________________________________________________________________________________________     _______________________________________________________________________  Medication Rules  Purpose: To inform patients, and their family members, of our medication rules and regulations.  Applies to: All patients receiving prescriptions from our practice (written or electronic).  Pharmacy of record: This is the pharmacy where your electronic prescriptions will be sent. Make sure we have the correct one.  Electronic prescriptions: In compliance with the [facility] Strengthen Opioid Misuse Prevention (STOP) Act of 2017 (Session Conni Elliot [phone]), effective September 30, 2018, all controlled substances must be electronically prescribed. Written prescriptions, faxing, or calling prescriptions to a pharmacy will no longer be done.  Prescription refills: These will be provided only during in-person appointments. No medications will be renewed without a "face-to-face" evaluation with your provider. Applies to all prescriptions.  NOTE: The following applies primarily to controlled substances (Opioid* Pain Medications).   Type of encounter (visit): For patients receiving controlled substances, face-to-face visits are required. (Not an option and not up to the patient.)  Patient's responsibilities: Pain Pills: Bring all pain pills to every appointment (except for procedure appointments). Pill Bottles: Bring  pills in original pharmacy bottle. Bring bottle, even if empty. Always bring the bottle of the most recent fill.  Medication refills: You are responsible for knowing and keeping track of what medications you are taking and when is it that you will need a refill. The day before your appointment: write a list of all prescriptions that need to be refilled. The day of the appointment: give the list to the admitting nurse. Prescriptions will be written only during appointments. No prescriptions will be written on procedure days. If you forget a medication: it will not be "Called in", "Faxed", or "electronically sent". You will need to get another appointment to get these prescribed. No early refills. Do not call asking to have your prescription filled early. Partial  or short prescriptions: Occasionally your pharmacy may not have enough pills to fill your prescription.  NEVER ACCEPT a partial fill or a prescription that is short of the total amount of pills that you were prescribed.  With controlled substances the law allows 72 hours for the pharmacy to complete the prescription.  If the prescription is not completed within 72 hours, the pharmacist will require a new prescription to be written. This means that you will be short on your medicine and we WILL NOT send another prescription to complete your original prescription.  Instead, request the pharmacy to send a carrier to a nearby branch to get enough medication to provide you with your full prescription. Prescription Accuracy: You are responsible for carefully inspecting your  prescriptions before leaving our office. Have the discharge nurse carefully go over each prescription with you, before taking them home. Make sure that your name is accurately spelled, that your address is correct. Check the name and dose of your medication to make sure it is accurate. Check the number of pills, and the written instructions to make sure they are clear and accurate. Make  sure that you are given enough medication to last until your next medication refill appointment. Taking Medication: Take medication as prescribed. When it comes to controlled substances, taking less pills or less frequently than prescribed is permitted and encouraged. Never take more pills than instructed. Never take the medication more frequently than prescribed.  Inform other Doctors: Always inform, all of your healthcare providers, of all the medications you take. Pain Medication from other Providers: You are not allowed to accept any additional pain medication from any other Doctor or Healthcare provider. There are two exceptions to this rule. (see below) In the event that you require additional pain medication, you are responsible for notifying us, as stated below. Cough Medicine: Often these contain an opioid, such as codeine or hydrocodone. Never accept or take cough medicine containing these opioids if you are already taking an opioid* medication. The combination may cause respiratory failure and death. Medication Agreement: You are responsible for carefully reading and following our Medication Agreement. This must be signed before receiving any prescriptions from our practice. Safely store a copy of your signed Agreement. Violations to the Agreement will result in no further prescriptions. (Additional copies of our Medication Agreement are available upon request.) Laws, Rules, & Regulations: All patients are expected to follow all [address] and [employer], Rules, [place] [employer]. Ignorance of the Laws does not constitute a valid excuse.  Illegal drugs and Controlled Substances: The use of illegal substances (including, but not limited to marijuana and its derivatives) and/or the illegal use of any controlled substances is strictly prohibited. Violation of this rule may result in the immediate and permanent discontinuation of any and all prescriptions being written by our practice. The use of  any illegal substances is prohibited. Adopted CDC guidelines & recommendations: Target dosing levels will be at or below 60 MME/day. Use of benzodiazepines** is not recommended.  Exceptions: There are only two exceptions to the rule of not receiving pain medications from other Healthcare Providers. Exception #1 (Emergencies): In the event of an emergency (i.e.: accident requiring emergency care), you are allowed to receive additional pain medication. However, you are responsible for: As soon as you are able, call our office [phone], at any time of the day or night, and leave a message stating your name, the date and nature of the emergency, and the name and dose of the medication prescribed. In the event that your call is answered by a member of our staff, make sure to document and save the date, time, and the name of the person that took your information.  Exception #2 (Planned Surgery): In the event that you are scheduled by another doctor or dentist to have any type of surgery or procedure, you are allowed (for a period no longer than 30 days), to receive additional pain medication, for the acute post-op pain. However, in this case, you are responsible for picking up a copy of our "Post-op Pain Management for Surgeons" handout, and giving it to your surgeon or dentist. This document is available at our office, and does not require an appointment to obtain it. Simply go to  our office during business hours (Monday-Thursday from 8:00 AM to 4:00 PM) (Friday 8:00 AM to 12:00 Noon) or if you have a scheduled appointment with [place], prior to your surgery, and ask for it by name. In addition, you are responsible for: calling our office [phone], at any time of the day or night, and leaving a message stating your name, name of your surgeon, type of surgery, and date of procedure or surgery. Failure to comply with your responsibilities may result in termination of therapy involving the controlled  substances. Medication Agreement Violation. Following the above rules, including your responsibilities will help you in avoiding a Medication Agreement Violation ("Breaking your Pain Medication Contract").  Consequences:  Not following the above rules may result in permanent discontinuation of medication prescription therapy.  *Opioid medications include: morphine, codeine, oxycodone, oxymorphone, hydrocodone, hydromorphone, meperidine, tramadol, tapentadol, buprenorphine, fentanyl, methadone. **Benzodiazepine medications include: diazepam (Valium), alprazolam (Xanax), clonazepam (Klonopine), lorazepam (Ativan), clorazepate (Tranxene), chlordiazepoxide (Librium), estazolam (Prosom), oxazepam (Serax), temazepam (Restoril), triazolam (Halcion) (Last updated: 07/23/2022) ______________________________________________________________________    ______________________________________________________________________  Medication Recommendations and Reminders  Applies to: All patients receiving prescriptions (written and/or electronic).  Medication Rules & Regulations: You are responsible for reading, knowing, and following our "Medication Rules" document. These exist for your safety and that of others. They are not flexible and neither are we. Dismissing or ignoring them is an act of "non-compliance" that may result in complete and irreversible termination of such medication therapy. For safety reasons, "non-compliance" will not be tolerated. As with the [place] fundamental legal principle of "ignorance of the law is no defense", we will accept no excuses for not having read and knowing the content of documents provided to you by our practice.  Pharmacy of record:  Definition: This is the pharmacy where your electronic prescriptions will be sent.  We do not endorse any particular pharmacy. It is up to you and your insurance to decide what pharmacy to use.  We do not restrict you in your choice of  pharmacy. However, once we write for your prescriptions, we will NOT be re-sending more prescriptions to fix restricted supply problems created by your pharmacy, or your insurance.  The pharmacy listed in the electronic medical record should be the one where you want electronic prescriptions to be sent. If you choose to change pharmacy, simply notify our nursing staff. Changes will be made only during your regular appointments and not over the phone.  Recommendations: Keep all of your pain medications in a safe place, under lock and key, even if you live alone. We will NOT replace lost, stolen, or damaged medication. We do not accept "Police Reports" as proof of medications having been stolen. After you fill your prescription, take 1 week's worth of pills and put them away in a safe place. You should keep a separate, properly labeled bottle for this purpose. The remainder should be kept in the original bottle. Use this as your primary supply, until it runs out. Once it's gone, then you know that you have 1 week's worth of medicine, and it is time to come in for a prescription refill. If you do this correctly, it is unlikely that you will ever run out of medicine. To make sure that the above recommendation works, it is very important that you make sure your medication refill appointments are scheduled at least 1 week before you run out of medicine. To do this in an effective manner, make sure that you do not leave the office without  scheduling your next medication management appointment. Always ask the nursing staff to show you in your prescription , when your medication will be running out. Then arrange for the receptionist to get you a return appointment, at least 7 days before you run out of medicine. Do not wait until you have 1 or 2 pills left, to come in. This is very poor planning and does not take into consideration that we may need to cancel appointments due to bad weather, sickness, or emergencies  affecting our staff. DO NOT ACCEPT A "Partial Fill": If for any reason your pharmacy does not have enough pills/tablets to completely fill or refill your prescription, do not allow for a "partial fill". The law allows the pharmacy to complete that prescription within 72 hours, without requiring a new prescription. If they do not fill the rest of your prescription within those 72 hours, you will need a separate prescription to fill the remaining amount, which we will NOT provide. If the reason for the partial fill is your insurance, you will need to talk to the pharmacist about payment alternatives for the remaining tablets, but again, DO NOT ACCEPT A PARTIAL FILL, unless you can trust your pharmacist to obtain the remainder of the pills within 72 hours.  Prescription refills and/or changes in medication(s):  Prescription refills, and/or changes in dose or medication, will be conducted only during scheduled medication management appointments. (Applies to both, written and electronic prescriptions.) No refills on procedure days. No medication will be changed or started on procedure days. No changes, adjustments, and/or refills will be conducted on a procedure day. Doing so will interfere with the diagnostic portion of the procedure. No phone refills. No medications will be "called into the pharmacy". No Fax refills. No weekend refills. No Holliday refills. No after hours refills.  Remember:  Business hours are:  Monday to Thursday 8:00 AM to 4:00 PM Provider's Schedule: Delano Metz, MD - Appointments are:  Medication management: Monday and Wednesday 8:00 AM to 4:00 PM Procedure day: Tuesday and Thursday 7:30 AM to 4:00 PM Edward Jolly, MD - Appointments are:  Medication management: Tuesday and Thursday 8:00 AM to 4:00 PM Procedure day: Monday and Wednesday 7:30 AM to 4:00 PM (Last update: 07/23/2022) ______________________________________________________________________    ____________________________________________________________________________________________  Naloxone Nasal Spray  Why am I receiving this medication? [place] STOP ACT requires that all patients taking high dose opioids or at risk of opioids respiratory depression, be prescribed an opioid reversal agent, such as Naloxone (AKA: Narcan).  What is this medication? NALOXONE (nal OX one) treats opioid overdose, which causes slow or shallow breathing, severe drowsiness, or trouble staying awake. Call emergency services after using this medication. You may need additional treatment. Naloxone works by reversing the effects of opioids. It belongs to a group of medications called opioid blockers.  COMMON BRAND NAME(S): Kloxxado, Narcan  What should I tell my care team before I take this medication? They need to know if you have any of these conditions: Heart disease Substance use disorder An unusual or allergic reaction to naloxone, other medications, foods, dyes, or preservatives Pregnant or trying to get pregnant Breast-feeding  When to use this medication? This medication is to be used for the treatment of respiratory depression (less than 8 breaths per minute) secondary to opioid overdose.   How to use this medication? This medication is for use in the nose. Lay the person on their back. Support their neck with your hand and allow the head to tilt  back before giving the medication. The nasal spray should be given into 1 nostril. After giving the medication, move the person onto their side. Do not remove or test the nasal spray until ready to use. Get emergency medical help right away after giving the first dose of this medication, even if the person wakes up. You should be familiar with how to recognize the signs and symptoms of a narcotic overdose. If more doses are needed, give the additional dose in the other nostril. Talk to your care team about the use of this medication in children.  While this medication may be prescribed for children as young as newborns for selected conditions, precautions do apply.  Naloxone Overdosage: If you think you have taken too much of this medicine contact a poison control center or emergency room at once.  NOTE: This medicine is only for you. Do not share this medicine with others.  What if I miss a dose? This does not apply.  What may interact with this medication? This is only used during an emergency. No interactions are expected during emergency use. This list may not describe all possible interactions. Give your health care provider a list of all the medicines, herbs, non-prescription drugs, or dietary supplements you use. Also tell them if you smoke, drink alcohol, or use illegal drugs. Some items may interact with your medicine.  What should I watch for while using this medication? Keep this medication ready for use in the case of an opioid overdose. Make sure that you have the phone number of your care team and local hospital ready. You may need to have additional doses of this medication. Each nasal spray contains a single dose. Some emergencies may require additional doses. After use, bring the treated person to the nearest hospital or call 911. Make sure the treating care team knows that the person has received a dose of this medication. You will receive additional instructions on what to do during and after use of this medication before an emergency occurs.  What side effects may I notice from receiving this medication? Side effects that you should report to your care team as soon as possible: Allergic reactions--skin rash, itching, hives, swelling of the face, lips, tongue, or throat Side effects that usually do not require medical attention (report these to your care team if they continue or are bothersome): Constipation Dryness or irritation inside the nose Headache Increase in blood pressure Muscle spasms Stuffy  nose Toothache This list may not describe all possible side effects. Call your doctor for medical advice about side effects. You may report side effects to FDA at 1-800-FDA-1088.  Where should I keep my medication? Because this is an emergency medication, you should keep it with you at all times.  Keep out of the reach of children and pets. Store between 20 and 25 degrees C (68 and 77 degrees F). Do not freeze. Throw away any unused medication after the expiration date. Keep in original box until ready to use.  NOTE: This sheet is a summary. It may not cover all possible information. If you have questions about this medicine, talk to your doctor, pharmacist, or health care provider.   2023 Elsevier/Gold Standard (2021-05-25 00:00:00)  ____________________________________________________________________________________________

## 2023-03-18 ENCOUNTER — Ambulatory Visit: Payer: Medicaid Other | Admitting: Podiatry

## 2023-03-19 ENCOUNTER — Ambulatory Visit
Admission: RE | Admit: 2023-03-19 | Discharge: 2023-03-19 | Disposition: A | Discharge status: Home / Self Care | Payer: Medicaid Other | Source: Ambulatory Visit | Attending: Otolaryngology | Admitting: Otolaryngology

## 2023-03-19 DIAGNOSIS — E041 Nontoxic single thyroid nodule: Secondary | ICD-10-CM | POA: Diagnosis not present

## 2023-03-19 DIAGNOSIS — R1310 Dysphagia, unspecified: Secondary | ICD-10-CM | POA: Insufficient documentation

## 2023-03-19 DIAGNOSIS — K219 Gastro-esophageal reflux disease without esophagitis: Secondary | ICD-10-CM | POA: Diagnosis not present

## 2023-03-19 NOTE — Progress Notes (Signed)
Modified Barium Swallow Study  Patient Details  Name: Dana Bradley MRN: [id number] Date of Birth: 1971/09/23  Today's Date: 03/19/2023  Modified Barium Swallow completed.  Full report located under Chart Review in the Imaging Section.  History of Present Illness Pt is a 52 year old woman w/ Baseline medical issues including chronic intractable migraines s/p numerous treatments, severe fibromyalgia, IBS, anxiety/depression, GERD w/ Retrograde activity(on PPI), and nausea, vertigo, Cervical myofascial pain syndrome, Trigeminal Neuralgia, on CPAP at night, Obesity.  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  Pt endorsed significant STRESS in her life ongoing including: "daughter was recently diagnosed with sarcoma Ca and this is a great source of stress and fear to Mrs. Suzie Portela. It has been a turbulent time for her and this has understandably worsened her symptoms.", per MD note.  Pt is also helping to care for her Mother who is ill/declining in medical status.  No recent Pulmonary imaging per chart. She is followed by multiple MDs including ENT, GI.   Clinical Impression Patient appears to present with grossly functional oropharyngeal phase swallowing. No aspiration nor laryngeal penetration occurred during this study.  Oral stage is characterized by adequate lip closure, bolus preparation and containment, and anterior to posterior transit w/ liquids -- however, min disorganized bolus transport noted w/ increased textures of pudding/solid. Bolus prep was slightly less efficient/timely w/ the increased textures as pt stated she had to "try to make herself swallow it", then gagged afterward x2. Swallow initiation occurs at the level of the posterior laryngeal surface of the epiglottis and filling the pyriform sinuses x2(thin liquids) -- suspect a direct impact from the GERD and frequent Retrograde activity of Acid Reflux that pt  reports to IBS/GERD activity "especially at night".   Pharyngeal stage is noted for adequate tongue base retraction, adequate hyolaryngeal excursion, and adequate pharyngeal constriction. Epiglottic deflection is complete; there is no penetration nor aspiration. There is No pharyngeal residue following the swallow. Pharyngeal stripping wave is complete.  Amplitude/duration of cricopharyngeus opening is grossly WFL -- partial distention noted during what appeared to be Disorganized opening/relazation; unsure if direct impact from Esophageal phase Dysmotility. There is adequate/complete clearance through the cervical esophagus of bolus material -- slight slowing at the level of the shoulders(viewable) noted w/ solid trial. A 13 mm barium tablet was given in Puree which was cleared appropriately through the oropharynx.  Consistencies tested were thin liquids x2 tsps, 2 cup sip, 2 sequential sips, nectar x1 tsp, 1 cup sip, 2 sequential cup sips, honey x1 tsp, pudding x1 tsp, regular solid (1/2 graham cracker with pudding), and 13 mm barium tablet with Puree.  Pt reported globus sensations intermittently at level of thyroid notch even when no contrast/residue remained in the pharynx/Esophagus.    Recommend patient continue fairly regular diet with well-cut foods moistened thoroughly and thin liquids via Cup -- less air swallowed by not using a Straw; educated pt verbally re: strategies including moistening dry meats, alternating solids and liquids, swallowing calmly, taking TIME b/t bites and sips, NOT eating/drinking in a hurry or during other tasks. No further ST indicated. Factors that may increase risk of adverse event in presence of aspiration Rubye Oaks [employer] Clearance Coots 2021): Respiratory or GI disease (multiple medical issues -- see chart/MD notes)   Swallow Evaluation Recommendations Recommendations: PO diet PO Diet Recommendation: Regular;Thin liquids (Level 0) (well-cut; moistened) Liquid Administration via:  Cup;No straw Medication Administration: Whole meds  with puree (if provides ease of swallowing; explore liquid or chewable forms w/ Pharmacist) Supervision: Patient able to self-feed Swallowing strategies  : Minimize environmental distractions;Slow rate;Small bites/sips Postural changes: Position pt fully upright for meals;Stay upright 30-60 min after meals (RELUX/GERD precautions) Oral care recommendations: Oral care BID (2x/day);Pt independent with oral care Recommended consults: Consider ENT consultation;Consider GI consultation;Consider esophageal assessment;Consider dietitian consultation (consider Manometry w/ GI);Consider f/u for managing Stressors in her life currently - ways to reduce Stress         Jerilynn Som, MS, CCC-SLP Speech Language Pathologist Rehab Services; [facility] [place] [phone] (ascom) Brionne Mertz 03/19/2023,2:37 PM

## 2023-03-21 ENCOUNTER — Other Ambulatory Visit (HOSPITAL_COMMUNITY): Payer: Self-pay

## 2023-03-21 ENCOUNTER — Other Ambulatory Visit: Payer: Self-pay

## 2023-03-21 ENCOUNTER — Ambulatory Visit (INDEPENDENT_AMBULATORY_CARE_PROVIDER_SITE_OTHER): Payer: Medicaid Other

## 2023-03-21 VITALS — BP 120/83 | HR 92 | Temp 98.0°F | Resp 12 | Ht 63.0 in | Wt 222.8 lb

## 2023-03-21 DIAGNOSIS — G43711 Chronic migraine without aura, intractable, with status migrainosus: Secondary | ICD-10-CM

## 2023-03-21 MED ORDER — SODIUM CHLORIDE 0.9 % IV SOLN
300.0000 mg | Freq: Once | INTRAVENOUS | Status: AC
  Administered 2023-03-21: 300 mg via INTRAVENOUS
  Filled 2023-03-21: qty 3

## 2023-03-21 NOTE — Progress Notes (Signed)
Diagnosis: Migraine headache  Provider:  Chilton Greathouse MD  Procedure: IV Infusion  IV Type: Peripheral, IV Location: R Forearm  Vyepti (Eptinezumab-jjmr), Dose: 300 mg  Infusion Start Time: 1042  Infusion Stop Time: 1113  Post Infusion IV Care: Patient declined observation and Peripheral IV Discontinued  Discharge: Condition: Stable, Destination: Home . AVS Declined  Performed by:  Wyvonne Lenz, RN

## 2023-03-25 ENCOUNTER — Encounter: Payer: Medicaid Other | Admitting: Physical Medicine and Rehabilitation

## 2023-03-25 DIAGNOSIS — G43E09 Chronic migraine with aura, not intractable, without status migrainosus: Secondary | ICD-10-CM | POA: Diagnosis not present

## 2023-03-25 DIAGNOSIS — E559 Vitamin D deficiency, unspecified: Secondary | ICD-10-CM | POA: Diagnosis not present

## 2023-03-25 DIAGNOSIS — G3184 Mild cognitive impairment, so stated: Secondary | ICD-10-CM | POA: Diagnosis not present

## 2023-03-25 DIAGNOSIS — G629 Polyneuropathy, unspecified: Secondary | ICD-10-CM | POA: Diagnosis not present

## 2023-03-25 DIAGNOSIS — F439 Reaction to severe stress, unspecified: Secondary | ICD-10-CM | POA: Diagnosis not present

## 2023-03-25 DIAGNOSIS — R42 Dizziness and giddiness: Secondary | ICD-10-CM | POA: Diagnosis not present

## 2023-03-25 DIAGNOSIS — G4733 Obstructive sleep apnea (adult) (pediatric): Secondary | ICD-10-CM | POA: Diagnosis not present

## 2023-03-26 DIAGNOSIS — G3184 Mild cognitive impairment, so stated: Secondary | ICD-10-CM | POA: Diagnosis not present

## 2023-03-26 DIAGNOSIS — Z79899 Other long term (current) drug therapy: Secondary | ICD-10-CM | POA: Diagnosis not present

## 2023-03-28 ENCOUNTER — Other Ambulatory Visit: Payer: Self-pay | Admitting: Neurology

## 2023-03-28 DIAGNOSIS — G3184 Mild cognitive impairment, so stated: Secondary | ICD-10-CM

## 2023-04-01 ENCOUNTER — Other Ambulatory Visit: Payer: Self-pay | Admitting: Otolaryngology

## 2023-04-01 DIAGNOSIS — R1314 Dysphagia, pharyngoesophageal phase: Secondary | ICD-10-CM | POA: Diagnosis not present

## 2023-04-01 DIAGNOSIS — E041 Nontoxic single thyroid nodule: Secondary | ICD-10-CM

## 2023-04-04 DIAGNOSIS — G4733 Obstructive sleep apnea (adult) (pediatric): Secondary | ICD-10-CM | POA: Diagnosis not present

## 2023-04-10 ENCOUNTER — Encounter: Payer: Self-pay | Admitting: Neurology

## 2023-04-12 ENCOUNTER — Ambulatory Visit
Admission: RE | Admit: 2023-04-12 | Discharge: 2023-04-12 | Disposition: A | Discharge status: Home / Self Care | Payer: Medicaid Other | Source: Ambulatory Visit | Attending: Neurology | Admitting: Neurology

## 2023-04-12 DIAGNOSIS — G3184 Mild cognitive impairment, so stated: Secondary | ICD-10-CM

## 2023-04-12 DIAGNOSIS — G43909 Migraine, unspecified, not intractable, without status migrainosus: Secondary | ICD-10-CM | POA: Diagnosis not present

## 2023-04-15 ENCOUNTER — Emergency Department: Payer: Medicaid Other

## 2023-04-15 ENCOUNTER — Other Ambulatory Visit: Payer: Self-pay

## 2023-04-15 ENCOUNTER — Emergency Department
Admission: EM | Admit: 2023-04-15 | Discharge: 2023-04-15 | Disposition: A | Discharge status: Home / Self Care | Payer: Medicaid Other | Attending: Emergency Medicine | Admitting: Emergency Medicine

## 2023-04-15 ENCOUNTER — Encounter: Payer: Self-pay | Admitting: Emergency Medicine

## 2023-04-15 DIAGNOSIS — R112 Nausea with vomiting, unspecified: Secondary | ICD-10-CM | POA: Insufficient documentation

## 2023-04-15 DIAGNOSIS — N200 Calculus of kidney: Secondary | ICD-10-CM | POA: Diagnosis not present

## 2023-04-15 DIAGNOSIS — R109 Unspecified abdominal pain: Secondary | ICD-10-CM | POA: Diagnosis not present

## 2023-04-15 DIAGNOSIS — R197 Diarrhea, unspecified: Secondary | ICD-10-CM | POA: Insufficient documentation

## 2023-04-15 LAB — CBC
HCT: 43.4 % (ref 36.0–46.0)
Hemoglobin: 14.7 g/dL (ref 12.0–15.0)
MCH: 32 pg (ref 26.0–34.0)
MCHC: 33.9 g/dL (ref 30.0–36.0)
MCV: 94.6 fL (ref 80.0–100.0)
Platelets: 300 10*3/uL (ref 150–400)
RBC: 4.59 MIL/uL (ref 3.87–5.11)
RDW: 14.1 % (ref 11.5–15.5)
WBC: 20.1 10*3/uL — ABNORMAL HIGH (ref 4.0–10.5)
nRBC: 0.1 % (ref 0.0–0.2)

## 2023-04-15 LAB — URINALYSIS, ROUTINE W REFLEX MICROSCOPIC
Bilirubin Urine: NEGATIVE
Glucose, UA: NEGATIVE mg/dL
Hgb urine dipstick: NEGATIVE
Ketones, ur: 20 mg/dL — AB
Leukocytes,Ua: NEGATIVE
Nitrite: NEGATIVE
Protein, ur: NEGATIVE mg/dL
Specific Gravity, Urine: >1.046 — ABNORMAL HIGH (ref 1.005–1.030)
pH: 7 (ref 5.0–8.0)

## 2023-04-15 LAB — COMPREHENSIVE METABOLIC PANEL
ALT: 28 U/L (ref 0–44)
AST: 28 U/L (ref 15–41)
Albumin: 4 g/dL (ref 3.5–5.0)
Alkaline Phosphatase: 121 U/L (ref 38–126)
Anion gap: 13 (ref 5–15)
BUN: 13 mg/dL (ref 6–20)
CO2: 23 mmol/L (ref 22–32)
Calcium: 9.9 mg/dL (ref 8.9–10.3)
Chloride: 103 mmol/L (ref 98–111)
Creatinine, Ser: 0.62 mg/dL (ref 0.44–1.00)
GFR, Estimated: >60 mL/min (ref >60)
Glucose, Bld: 174 mg/dL — ABNORMAL HIGH (ref 70–99)
Potassium: 3.5 mmol/L (ref 3.5–5.1)
Sodium: 139 mmol/L (ref 135–145)
Total Bilirubin: 1 mg/dL (ref 0.3–1.2)
Total Protein: 7.7 g/dL (ref 6.5–8.1)

## 2023-04-15 LAB — LIPASE, BLOOD: Lipase: 34 U/L (ref 11–51)

## 2023-04-15 MED ORDER — ONDANSETRON HCL 4 MG/2ML IJ SOLN
4.0000 mg | Freq: Once | INTRAMUSCULAR | Status: DC
  Filled 2023-04-15: qty 2

## 2023-04-15 MED ORDER — IOHEXOL 300 MG/ML  SOLN
100.0000 mL | Freq: Once | INTRAMUSCULAR | Status: AC | PRN
  Administered 2023-04-15: 100 mL via INTRAVENOUS

## 2023-04-15 MED ORDER — SODIUM CHLORIDE 0.9 % IV SOLN
25.0000 mg | Freq: Once | INTRAVENOUS | Status: AC
  Administered 2023-04-15: 25 mg via INTRAVENOUS
  Filled 2023-04-15: qty 1

## 2023-04-15 MED ORDER — SODIUM CHLORIDE 0.9 % IV BOLUS
1000.0000 mL | Freq: Once | INTRAVENOUS | Status: AC
  Administered 2023-04-15: 1000 mL via INTRAVENOUS

## 2023-04-15 NOTE — ED Provider Notes (Signed)
[facility] Provider Note    Event Date/Time   First MD Initiated Contact with Patient 04/15/23 1823     (approximate)   History   Abdominal Pain   HPI  Dana Bradley is a 52 y.o. female   Past medical history of chronic fatigue, chronic pain, fibromyalgia, IBS, GERD, who presents to the emergency department with nausea vomiting diarrhea for the last several days.  Not unusual for her to have the symptoms and she is requesting promethazine as she knows that this medication gives her relief.  No known sick contacts.  No GI bleeding.  No fever or chills.  No chest pain trouble breathing cough or fever.  No urinary symptoms.  Independent Historian contributed to assessment above: Her husband is at bedside to corroborate information given above  External Medical Documents Reviewed: GI note and colonoscopy performed earlier this year with 1 polyp, otherwise unremarkable.      Physical Exam   Triage Vital Signs: ED Triage Vitals  Encounter Vitals Group     BP 04/15/23 1726 111/68     Systolic BP Percentile --      Diastolic BP Percentile --      Pulse Rate 04/15/23 1726 (!) 119     Resp 04/15/23 1726 18     Temp 04/15/23 1726 97.7 F (36.5 C)     Temp Source 04/15/23 1726 Oral     SpO2 04/15/23 1726 97 %     Weight 04/15/23 1727 225 lb (102.1 kg)     Height 04/15/23 1727 5\' 3"  (1.6 m)     Head Circumference --      Peak Flow --      Pain Score 04/15/23 1726 5     Pain Loc --      Pain Education --      Exclude from Growth Chart --     Most recent vital signs: Vitals:   04/15/23 1900 04/15/23 2201  BP: 110/70 137/79  Pulse: (!) 107 (!) 105  Resp: 18 18  Temp: 97.9 F (36.6 C) 97.9 F (36.6 C)  SpO2: 100% 100%    General: Awake, no distress.  CV:  Good peripheral perfusion.  Resp:  Normal effort.  Abd:  No distention.  Other:  Awake alert comfortable slightly tachycardic, appears slightly dehydrated with dry mucous membranes.  Soft  abdomen without rigidity or guarding but mild tenderness throughout.   ED Results / Procedures / Treatments   Labs (all labs ordered are listed, but only abnormal results are displayed) Labs Reviewed  COMPREHENSIVE METABOLIC PANEL - Abnormal; Notable for the following components:      Result Value   Glucose, Bld 174 (*)    All other components within normal limits  CBC - Abnormal; Notable for the following components:   WBC 20.1 (*)    All other components within normal limits  URINALYSIS, ROUTINE W REFLEX MICROSCOPIC - Abnormal; Notable for the following components:   Color, Urine YELLOW (*)    APPearance HAZY (*)    Specific Gravity, Urine >1.046 (*)    Ketones, ur 20 (*)    All other components within normal limits  LIPASE, BLOOD     I ordered and reviewed the above labs they are notable for white blood cell count is elevated at 20     RADIOLOGY I independently reviewed and interpreted CT of the abdomen pelvis see no evidence of obstructive or inflammatory changes   PROCEDURES:  Critical Care performed:  No  Procedures   MEDICATIONS ORDERED IN ED: Medications  ondansetron (ZOFRAN) injection 4 mg (4 mg Intravenous Patient Refused/Not Given 04/15/23 1923)  sodium chloride 0.9 % bolus 1,000 mL (0 mLs Intravenous Stopped 04/15/23 2201)  iohexol (OMNIPAQUE) 300 MG/ML solution 100 mL (100 mLs Intravenous Contrast Given 04/15/23 1931)  promethazine (PHENERGAN) 25 mg in sodium chloride 0.9 % 50 mL IVPB (0 mg Intravenous Stopped 04/15/23 2037)    IMPRESSION / MDM / ASSESSMENT AND PLAN / ED COURSE  I reviewed the triage vital signs and the nursing notes.                                Patient's presentation is most consistent with acute presentation with potential threat to life or bodily function.  Differential diagnosis includes, but is not limited to, gastroenteritis, IBS, intra-abdominal infection, obstruction, urinary tract infection   The patient is on the  cardiac monitor to evaluate for evidence of arrhythmia and/or significant heart rate changes.  MDM:    Patient with nausea vomiting diarrhea and abdominal discomfort looks slightly dehydrated responded well with her requested promethazine and fluids.  Comfortable appearing now after treatment, CT with no remarkable findings to suggest emergent intra-abdominal pathologies.  She has a nonspecific white blood cell count elevation but given her overall well appearance, symptom improvement, and negative CAT scan, negative blood testing otherwise, negative urinalysis plan will be for outpatient follow-up.       FINAL CLINICAL IMPRESSION(S) / ED DIAGNOSES   Final diagnoses:  Nausea vomiting and diarrhea     Rx / DC Orders   ED Discharge Orders     None        Note:  This document was prepared using Dragon voice recognition software and may include unintentional dictation errors.    Pilar Jarvis, MD 04/15/23 [phone]

## 2023-04-15 NOTE — ED Triage Notes (Signed)
Patient to ED via POV fro abd pain x "a few days" with vomiting and diarrhea starting at 1am today. Hx of IBS. Took zofran around 1645.

## 2023-04-15 NOTE — ED Notes (Signed)
This RN to bedside attempting to get triage story and hook pt up to monitor. Pt endorsing she has migraines and that this RN's perfume is too strong to be in the same room. Charge RN notified, charge to room and placing pt on monitor.

## 2023-04-15 NOTE — Discharge Instructions (Signed)
Take zofran as prescribed for nausea.  Drink plenty of fluids to stay well-hydrated.  Find Pedialyte or similar electrolyte rehydration formulas at your local pharmacy.  Thank you for choosing [place] for your health care today!  Please see your primary doctor this week for a follow up appointment.   If you have any new, worsening, or unexpected symptoms call your doctor right away or come back to the emergency department for reevaluation.  It was my pleasure to care for you today.   Daneil Dan [place] Charon, MD

## 2023-04-30 ENCOUNTER — Other Ambulatory Visit: Payer: Self-pay | Admitting: Physical Medicine and Rehabilitation

## 2023-04-30 DIAGNOSIS — M797 Fibromyalgia: Secondary | ICD-10-CM

## 2023-05-01 ENCOUNTER — Other Ambulatory Visit: Payer: Self-pay

## 2023-05-05 ENCOUNTER — Other Ambulatory Visit: Payer: Self-pay | Admitting: Pain Medicine

## 2023-05-05 DIAGNOSIS — Z79891 Long term (current) use of opiate analgesic: Secondary | ICD-10-CM

## 2023-05-05 DIAGNOSIS — Z79899 Other long term (current) drug therapy: Secondary | ICD-10-CM

## 2023-05-05 DIAGNOSIS — M5481 Occipital neuralgia: Secondary | ICD-10-CM

## 2023-05-05 DIAGNOSIS — G4733 Obstructive sleep apnea (adult) (pediatric): Secondary | ICD-10-CM | POA: Diagnosis not present

## 2023-05-05 DIAGNOSIS — M62838 Other muscle spasm: Secondary | ICD-10-CM

## 2023-05-05 DIAGNOSIS — M19011 Primary osteoarthritis, right shoulder: Secondary | ICD-10-CM

## 2023-05-05 DIAGNOSIS — G894 Chronic pain syndrome: Secondary | ICD-10-CM

## 2023-05-05 DIAGNOSIS — G8929 Other chronic pain: Secondary | ICD-10-CM

## 2023-05-12 ENCOUNTER — Encounter: Payer: Self-pay | Admitting: Family Medicine

## 2023-05-12 ENCOUNTER — Ambulatory Visit: Payer: Medicaid Other | Admitting: Family Medicine

## 2023-05-12 VITALS — BP 114/72 | HR 99 | Temp 98.3°F | Resp 16 | Ht 63.0 in | Wt 228.5 lb

## 2023-05-12 DIAGNOSIS — Z7984 Long term (current) use of oral hypoglycemic drugs: Secondary | ICD-10-CM | POA: Diagnosis not present

## 2023-05-12 DIAGNOSIS — Z79891 Long term (current) use of opiate analgesic: Secondary | ICD-10-CM | POA: Diagnosis not present

## 2023-05-12 DIAGNOSIS — E782 Mixed hyperlipidemia: Secondary | ICD-10-CM | POA: Diagnosis not present

## 2023-05-12 DIAGNOSIS — I10 Essential (primary) hypertension: Secondary | ICD-10-CM

## 2023-05-12 DIAGNOSIS — N950 Postmenopausal bleeding: Secondary | ICD-10-CM

## 2023-05-12 DIAGNOSIS — E119 Type 2 diabetes mellitus without complications: Secondary | ICD-10-CM | POA: Diagnosis not present

## 2023-05-12 DIAGNOSIS — N644 Mastodynia: Secondary | ICD-10-CM | POA: Diagnosis not present

## 2023-05-12 DIAGNOSIS — N63 Unspecified lump in unspecified breast: Secondary | ICD-10-CM

## 2023-05-12 DIAGNOSIS — I471 Supraventricular tachycardia, unspecified: Secondary | ICD-10-CM

## 2023-05-12 DIAGNOSIS — Z0289 Encounter for other administrative examinations: Secondary | ICD-10-CM

## 2023-05-12 NOTE — Patient Instructions (Signed)
Come get your labs done in the next month or so to recheck your A1C  Lab Results  Component Value Date   HGBA1C 7.3 (H) 02/13/2023

## 2023-05-12 NOTE — Progress Notes (Signed)
Name: Dana Bradley   MRN: [id number]    DOB: 1971/06/06   Date:05/12/2023       Progress Note  Chief Complaint  Patient presents with   Follow-up   Diabetes   Hypertension   Hyperlipidemia     Subjective:   Dana Bradley is a 52 y.o. female, presents to clinic for routine f/up     DM:   Pt managing DM with metformin 1000 mg BID Reports good med compliance Pt has no SE from meds. Blood sugars 110's-140 Denies: Polyuria, polydipsia, vision changes, neuropathy, hypoglycemia Occasionally she is thirsty - blood sugar hasn't been above 140 in am Recent pertinent labs: Lab Results  Component Value Date   HGBA1C 7.3 (H) 02/13/2023   HGBA1C 6.6 (H) 04/17/2022   HGBA1C 7.5 (H) 12/27/2021   Lab Results  Component Value Date   MICROALBUR 0.4 02/13/2023   LDLCALC 91 02/13/2023   CREATININE 0.62 04/15/2023   Standard of care and health maintenance: Urine Microalbumin:  done Foot exam:  done/UTD DM eye exam:   ACEI/ARB:  no Statin:  yes   Hyperlipidemia: On lipitor - labs recently checked Last Lipids: Lab Results  Component Value Date   CHOL 173 02/13/2023   HDL 36 (L) 02/13/2023   LDLCALC 91 02/13/2023   TRIG 343 (H) 02/13/2023   CHOLHDL 4.8 02/13/2023   - Denies: Chest pain, shortness of breath, myalgias, claudication   Hypertension:  BP at goal today BP Readings from Last 3 Encounters:  05/12/23 114/72  04/15/23 137/79  03/21/23 120/83  Pt denies CP, SOB, exertional sx, LE edema, palpitation, Ha's, visual disturbances, lightheadedness, hypotension, syncope.   Breast left - pain/swelling 3 weeks ago AUB darker brown/reddish discharge with wiping last 10/2020 was last menses Last GYN [facility]/Dr. Logan Bores    Current Outpatient Medications:    amitriptyline (ELAVIL) 150 MG tablet, Take 1 tablet (150 mg total) by mouth at bedtime., Disp: 90 tablet, Rfl: 3   Atogepant (QULIPTA) 10 MG TABS, Take 1 tablet by mouth daily as needed., Disp: 90 tablet, Rfl:  3   atorvastatin (LIPITOR) 40 MG tablet, TAKE ONE TABLET BY MOUTH EVERY NIGHT AT BEDTIME, Disp: 90 tablet, Rfl: 3   baclofen (LIORESAL) 10 MG tablet, Take 0.5-1 tablets (5-10 mg total) by mouth 3 (three) times daily. Each refill must last 30 days., Disp: 270 tablet, Rfl: 1   butalbital-acetaminophen-caffeine (BAC) 50-325-40 MG tablet, Take 1 tablet by mouth every 4 (four) hours as needed for migraine., Disp: 60 tablet, Rfl: 3   cyanocobalamin (,VITAMIN B-12,) 1000 MCG/ML injection, INJECT 1 ML (1,000 MCG) INTO THE MUSCLE ONCE FOR 1 DOSE, Disp: 1 mL, Rfl: 3   diclofenac Sodium (VOLTAREN) 1 % GEL, Apply topically., Disp: , Rfl:    dicyclomine (BENTYL) 10 MG capsule, Take by mouth., Disp: , Rfl:    Eptinezumab-jjmr (VYEPTI) 100 MG/ML injection, Inject 3 mLs (300 mg total) into the vein every 3 (three) months., Disp: 3 mL, Rfl: 3   linaclotide (LINZESS) 290 MCG CAPS capsule, Take 1 capsule (290 mcg total) by mouth daily before breakfast., Disp: 30 capsule, Rfl: 2   meclizine (ANTIVERT) 12.5 MG tablet, Take 1 tablet (12.5 mg total) by mouth 3 (three) times daily as needed for dizziness., Disp: 30 tablet, Rfl: 0   metFORMIN (GLUCOPHAGE) 1000 MG tablet, TAKE ONE TABLET BY MOUTH TWICE A DAY WITH A MEAL, Disp: 180 tablet, Rfl: 1   metoprolol succinate (TOPROL-XL) 100 MG 24 hr tablet, Take 1 tablet (  100 mg total) by mouth daily. Take with or immediately following a meal., Disp: 90 tablet, Rfl: 0   naloxone (NARCAN) nasal spray 4 mg/0.1 mL, Place 1 spray into the nose as needed for up to 365 doses (for opioid-induced respiratory depresssion). In case of emergency (overdose), spray once into each nostril. If no response within 3 minutes, repeat application and call 911., Disp: 1 each, Rfl: 0   Needles & Syringes MISC, For administration of B12 injections, Disp: 30 each, Rfl: 0   ondansetron (ZOFRAN) 4 MG tablet, Take 1 tablet (4 mg total) by mouth every 8 (eight) hours as needed., Disp: 20 tablet, Rfl: 2    pregabalin (LYRICA) 150 MG capsule, TAKE ONE CAPSULE BY MOUTH EVERY EIGHT HOURS, Disp: 270 capsule, Rfl: 3   Rimegepant Sulfate 75 MG TBDP, Take 1 tablet by mouth every other day., Disp: , Rfl:    rizatriptan (MAXALT) 10 MG tablet, Take 1 tablet (10 mg total) by mouth as needed for migraine. May repeat in 2 hours if needed, Disp: 10 tablet, Rfl: 3   scopolamine (TRANSDERM-SCOP) 1 MG/3DAYS, Place 1 patch (1.5 mg total) onto the skin every 3 (three) days., Disp: 10 patch, Rfl: 12   sertraline (ZOLOFT) 50 MG tablet, Take 1 tablet (50 mg total) by mouth daily., Disp: 90 tablet, Rfl: 3   triamcinolone cream (KENALOG) 0.1 %, Apply 1 Application topically 2 (two) times daily as needed (itch/rash). Do not apply to open skin., Disp: 30 g, Rfl: 0   dicyclomine (BENTYL) 10 MG capsule, Take 1 capsule (10 mg total) by mouth 4 (four) times daily as needed (before meals and at bedtime), Disp: 360 capsule, Rfl: 1   HYDROcodone-acetaminophen (NORCO) 7.5-325 MG tablet, Take 1 tablet by mouth 2 (two) times daily as needed for severe pain. Must last 30 days, Disp: 25 tablet, Rfl: 0   lipase/protease/amylase (CREON) 12000-38000 units CPEP capsule, Take 2 capsules by mouth with the first bite of each meal and take 1 capsule before snacks. (Max of 8 capsules per day). (Patient not taking: Reported on 05/12/2023), Disp: 270 capsule, Rfl: 1   LORazepam (ATIVAN) 1 MG tablet, Take by mouth. (Patient not taking: Reported on 05/12/2023), Disp: , Rfl:    medroxyPROGESTERone (PROVERA) 5 MG tablet, TAKE ONE TABLET BY MOUTH DAILY (Patient not taking: Reported on 05/12/2023), Disp: 90 tablet, Rfl: 1   pantoprazole (PROTONIX) 40 MG tablet, Take 40 mg by mouth daily. (Patient not taking: Reported on 05/12/2023), Disp: , Rfl:    VALERIAN PO, Take by mouth as needed. Makes Valerian tea about 3-4 times per week. (Patient not taking: Reported on 05/12/2023), Disp: , Rfl:    Vitamin D, Ergocalciferol, (DRISDOL) 1.25 MG (50000 UNIT) CAPS capsule,  Take 1 capsule (50,000 Units total) by mouth every 7 (seven) days. (Patient not taking: Reported on 05/12/2023), Disp: 7 capsule, Rfl: 0  Patient Active Problem List   Diagnosis Date Noted   Pharmacologic therapy 03/12/2023   Chronic use of opiate for therapeutic purpose 03/12/2023   Abnormal CT of the abdomen 11/13/2022   Adenomatous polyp of colon 11/13/2022   Herniated nucleus pulposus, L3-4 (Right) 10/29/2022   Lumbar radiculopathy 10/21/2022   Dyspepsia 09/26/2022   Blood in stool 09/26/2022   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HCC) 04/04/2022   Neurogenic bladder 01/28/2022   Urinary and fecal incontinence 01/28/2022   New onset type 2 diabetes mellitus (HCC) 01/02/2022   Excessive daytime sleepiness 12/14/2021   Lateral epicondylitis, left elbow 07/19/2021   Abnormal  MRI, cervical spine (02/14/2021) 02/28/2021   Cervicalgia 02/13/2021   Abnormal MRI, lumbar spine (12/27/2021) 11/09/2020   Chronic midline thoracic back pain 11/09/2020   Abnormal MRI, thoracic spine (08/02/2019) 11/09/2020   Thoracic spinal stenosis (T7-8) 11/09/2020   Prolapse of thoracic disc with radiculopathy (T7-8) 11/09/2020   Spasm of muscle of lower back 11/09/2020   Vertigo, benign paroxysmal, unspecified laterality 11/09/2020   Uncomplicated opioid dependence (HCC) 11/08/2020   Hyperalgesia 03/07/2020   Neurogenic urinary incontinence 03/01/2020   Other intervertebral disc degeneration, lumbar region 03/01/2020   Spinal stenosis of lumbosacral region 03/01/2020   Colon cancer screening 02/06/2020   Lumbar radiculitis (Right) 09/21/2019   Chronic lower extremity pain (Bilateral) 09/06/2019   Intractable migraine with aura without status migrainosus 08/10/2019   Other specified dorsopathies, sacral and sacrococcygeal region 08/03/2019   Latex precautions, history of latex allergy 08/03/2019   History of allergy to radiographic contrast media 08/03/2019   DDD (degenerative disc disease), cervical 07/21/2019    Cervical facet syndrome (Bilateral) (L>R) 07/21/2019   DDD (degenerative disc disease), thoracic 07/21/2019   Osteoarthritis of hip (Left) 07/21/2019   Chronic groin pain (Bilateral) (L>R) 07/21/2019   Chronic hip pain (Bilateral) (L>R) 07/21/2019   Somatic dysfunction of sacroiliac joint (Bilateral) 07/21/2019   Migraine with aura and with status migrainosus, not intractable 04/06/2019   Cervico-occipital neuralgia (Left) 04/06/2019   Weakness of leg (Left) 04/05/2019   Difficulty walking 04/05/2019   Chronic migraine without aura, with intractable migraine, so stated, with status migrainosus 12/27/2018   Malar rash 09/04/2018   Trigger point of neck (Left) 03/19/2018   Occipital headache 12/25/2017   Chronic fatigue syndrome with fibromyalgia 12/12/2017   Trigger point of shoulder region (Left) 11/17/2017   Myofascial pain syndrome (Left) (trapezius muscle) 07/22/2017   Lumbar L1-2 disc protrusion (Right) 04/07/2017   Muscle spasticity 04/01/2017   Osteoarthritis of shoulder (Bilateral) 04/01/2017   Lumbar spondylosis 01/06/2017   Chronic hip pain (Left) 12/24/2016   Chronic sacroiliac joint pain (Left) 12/24/2016   Lumbar facet syndrome (Bilateral) (L>R) 12/24/2016   Lumbar radiculitis (Left) 12/24/2016   Hypertriglyceridemia 11/27/2016   History of vasovagal episode 10/30/2016   Cervicogenic headache 09/09/2016   Medication monitoring encounter 08/29/2016   Controlled substance agreement signed 08/28/2016   Plantar fasciitis of left foot 08/28/2016   Vitamin B12 deficiency 08/28/2016   Hyperlipidemia 08/28/2016   Nephrolithiasis 08/12/2016   Chronic pain syndrome 08/07/2016   Long term current use of opiate analgesic 08/07/2016   Long term prescription opiate use 08/07/2016   Opiate use 08/07/2016   Long term prescription benzodiazepine use 08/07/2016   Neurogenic pain 08/07/2016   Chronic low back pain (1ry area of Pain) (Bilateral) (R>L) (midline) w/ sciatica (Bilateral)  08/07/2016   Chronic upper back pain (2ry area of Pain) (Bilateral) (L>R) 08/07/2016   Chronic abdominal pain (Right lower quadrant) 08/07/2016   Thoracic radiculitis (Bilateral: T10, T11) 08/07/2016   Chronic occipital neuralgia (3ry area of Pain) (Bilateral) (L>R) 08/07/2016   Chronic neck pain 08/07/2016   Chronic cervical radicular pain (Bilateral) (L>R) 08/07/2016   Chronic shoulder blade pain (Bilateral) (L>R) 08/07/2016   Chronic upper extremity pain (Bilateral) (R>L) 08/07/2016   Chronic knee pain (Bilateral) (R>L) 08/07/2016   Chronic ankle pain (Bilateral) 08/07/2016   Cervical spondylosis with myelopathy and radiculopathy 08/07/2016   Panic disorder with agoraphobia 05/29/2016   Depression, unspecified depression type 05/29/2016   Atypical lymphocytosis 05/01/2016   Vitamin D deficiency 05/01/2016   Chronic lower extremity  cramps (Bilateral) (R>L) 04/29/2016   Obesity, Class III, BMI 40-49.9 (morbid obesity) (HCC) 04/29/2016   GAD (generalized anxiety disorder) 04/29/2016   Fatigue 04/29/2016   Insomnia 07/12/2015   Migraine without aura and with status migrainosus, not intractable 07/12/2015   Chronic superficial gastritis 06/02/2015   Chronic pain of multiple joints 05/15/2015   Bilateral leg edema 05/02/2015   Paroxysmal supraventricular tachycardia (HCC) 04/17/2015   Exertional shortness of breath 04/17/2015   Bright red rectal bleeding 04/06/2015   DDD (degenerative disc disease), lumbosacral 01/24/2014   DDD (degenerative disc disease), lumbar 01/24/2014   Cervico-occipital neuralgia 12/29/2013   Fibromyalgia 12/29/2013   Migraine headache 12/29/2013   Menorrhagia 12/10/2012   Depression, major, recurrent, in remission (HCC) 01/12/2009   Chest pain 01/12/2009   Hypertension, benign essential, goal below 140/90 06/23/2008   History of PSVT (paroxysmal supraventricular tachycardia) 06/17/2008   Obstructive sleep apnea 06/17/2008   GERD 06/13/2008    Past  Surgical History:  Procedure Laterality Date   ABLATION     Uterine   BREAST BIOPSY Left    2014 U/S bx fibroadenoma   CARDIAC CATHETERIZATION     with ablation   COLONOSCOPY N/A 09/26/2022   Procedure: COLONOSCOPY;  Surgeon: Wyline Mood, MD;  Location: [facility] ENDOSCOPY;  Service: Gastroenterology;  Laterality: N/A;   COLONOSCOPY WITH PROPOFOL N/A 05/17/2015   Procedure: COLONOSCOPY WITH PROPOFOL;  Surgeon: Scot Jun, MD;  Location: [facility] ENDOSCOPY;  Service: Endoscopy;  Laterality: N/A;   COLONOSCOPY WITH PROPOFOL N/A 03/20/2020   Procedure: COLONOSCOPY WITH PROPOFOL;  Surgeon: Wyline Mood, MD;  Location: [facility] ENDOSCOPY;  Service: Gastroenterology;  Laterality: N/A;   COLONOSCOPY WITH PROPOFOL N/A 09/25/2022   Procedure: COLONOSCOPY WITH PROPOFOL;  Surgeon: Wyline Mood, MD;  Location: [facility] ENDOSCOPY;  Service: Gastroenterology;  Laterality: N/A;   COLONOSCOPY WITH PROPOFOL N/A 10/02/2022   Procedure: COLONOSCOPY WITH PROPOFOL;  Surgeon: Wyline Mood, MD;  Location: [facility] ENDOSCOPY;  Service: Gastroenterology;  Laterality: N/A;   COLONOSCOPY WITH PROPOFOL N/A 11/13/2022   Procedure: COLONOSCOPY WITH PROPOFOL;  Surgeon: Wyline Mood, MD;  Location: [facility] ENDOSCOPY;  Service: Gastroenterology;  Laterality: N/A;   ESOPHAGOGASTRODUODENOSCOPY N/A 05/17/2015   Procedure: ESOPHAGOGASTRODUODENOSCOPY (EGD);  Surgeon: Scot Jun, MD;  Location: [facility] ENDOSCOPY;  Service: Endoscopy;  Laterality: N/A;   ESOPHAGOGASTRODUODENOSCOPY N/A 09/25/2022   Procedure: ESOPHAGOGASTRODUODENOSCOPY (EGD);  Surgeon: Wyline Mood, MD;  Location: [facility] ENDOSCOPY;  Service: Gastroenterology;  Laterality: N/A;   ESOPHAGOGASTRODUODENOSCOPY N/A 09/26/2022   Procedure: ESOPHAGOGASTRODUODENOSCOPY (EGD);  Surgeon: Wyline Mood, MD;  Location: [facility] ENDOSCOPY;  Service: Gastroenterology;  Laterality: N/A;   KNEE ARTHROSCOPY     spg     6/18   Tibial Tubercle Bypass Right 1998   TUBAL LIGATION  10/01/1999    Family History   Problem Relation Age of Onset   Depression Mother    Hypertension Mother    Cancer Mother        Skin   Hyperlipidemia Mother    Anxiety disorder Mother    Migraines Mother    Alcohol abuse Father    Depression Father    Stroke Father    Heart disease Father    Hypertension Father    Anxiety disorder Father    Depression Sister    Hyperlipidemia Sister    Diabetes Sister    Hypertension Sister    Polycystic ovary syndrome Sister    Bipolar disorder Sister    Anxiety disorder Sister    Migraines Sister    Depression Sister  Hypertension Sister    Anxiety disorder Sister    Migraines Sister    Breast cancer Maternal Grandmother 17   Cancer Maternal Grandmother 61       Breast   Thyroid disease Maternal Grandmother    Arthritis Maternal Grandmother    Hyperlipidemia Maternal Grandmother    Aneurysm Maternal Grandfather    Hypertension Maternal Grandfather    Heart disease Maternal Grandfather    Alzheimer's disease Paternal Grandmother    Heart attack Paternal Grandfather    Hypertension Paternal Grandfather    COPD Paternal Grandfather    Heart disease Paternal Grandfather    Migraines Daughter    Other Daughter        leomyoa scaroma   Migraines Daughter    Migraines Son    Alzheimer's disease Other    Bladder Cancer Neg Hx    Kidney cancer Neg Hx     Social History   Tobacco Use   Smoking status: Former    Current packs/day: 0.00    Average packs/day: 2.0 packs/day for 3.0 years (6.0 ttl pk-yrs)    Types: Cigarettes    Start date: 12/10/1989    Quit date: 12/10/1992    Years since quitting: 30.4   Smokeless tobacco: Never   Tobacco comments:    quit 25 years ago  Vaping Use   Vaping status: Never Used  Substance Use Topics   Alcohol use: No    Comment: socially   Drug use: No     Allergies  Allergen Reactions   Aspirin Swelling   Cephalexin Rash   Cymbalta [Duloxetine Hcl] Other (See Comments)    Suicidal ideations and has homicidal  thoughts per patient   Depakote [Divalproex Sodium] Shortness Of Breath    W/ n/v   Gadolinium Derivatives     Pt was unable to breath Other reaction(s): Other (See Comments) Pt was unable to breath   Haloperidol Shortness Of Breath    W/ n/v   Meperidine Nausea And Vomiting    Patient projectile vomits and usually result in ER Other reaction(s): Vomiting   Metoclopramide Shortness Of Breath    Can't breath, wheezes Other reaction(s): Other (See Comments)   Morphine     Other reaction(s): Vomiting   Penicillins Rash    Other reaction(s): Unknown   Prochlorperazine Other (See Comments)    Panic attack Other reaction(s): Other (See Comments)   Tramadol Hcl Palpitations    Severely and adversely affects her SVT giving her tachycardias of 150-160 bpm.   Trazodone Shortness Of Breath    Other reaction(s): Other (See Comments)   Meloxicam Other (See Comments)    mouth sores, tingling, blisters in mouth Other reaction(s): Other (See Comments) mout sores, tingling mouth sores, tingling, blisters in mouth   Neomycin-Bacitracin Zn-Polymyx Rash   Tomato Hives    Tongue will blister   Egg-Derived Products Other (See Comments)    inflammation   Milk-Related Compounds Other (See Comments)    inflammation   Other     Other reaction(s): Other (See Comments)   Shellfish Allergy     Other reaction(s): Unknown   Shellfish-Derived Products Other (See Comments)    Other reaction(s): Unknown Other reaction(s): Unknown   Bacitra-Neomycin-Polymyxin-Hc Rash   Bacitracin-Neomycin-Polymyxin Rash   Cephalosporins Rash    rash   Ibuprofen Other (See Comments) and Rash    Blisters in mouth. Blisters in mouth.   Latex Itching    Other reaction(s): Unknown   Nsaids Other (See Comments)  Blisters in mouth; can take 1 ibuprofen 2x a month Other reaction(s): Other (See Comments), Unknown Blisters in mouth; can take 1 ibuprofen 2x a month   Sulfa Antibiotics Rash    rash Other reaction(s):  Unknown rash  Other reaction(s): Unknown rash rash Other reaction(s): Unknown rash   Sulfonamide Derivatives Rash    Health Maintenance  Topic Date Due   FOOT EXAM  04/05/2023   INFLUENZA VACCINE  05/01/2023   OPHTHALMOLOGY EXAM  05/12/2023 (Originally 04/12/2023)   COVID-19 Vaccine (4 - 2023-24 season) 05/28/2023 (Originally 05/31/2022)   Zoster Vaccines- Shingrix (1 of 2) 08/12/2023 (Originally 06/15/1990)   HEMOGLOBIN A1C  08/16/2023   MAMMOGRAM  09/14/2023   PAP SMEAR-Modifier  11/14/2023   Diabetic kidney evaluation - Urine ACR  02/13/2024   Diabetic kidney evaluation - eGFR measurement  04/14/2024   Colonoscopy  11/13/2032   Hepatitis C Screening  Completed   HIV Screening  Completed   HPV VACCINES  Aged Out   DTaP/Tdap/Td  Discontinued    Chart Review Today: I personally reviewed active problem list, medication list, allergies, family history, social history, health maintenance, notes from last encounter, lab results, imaging with the patient/caregiver today.   Review of Systems  Constitutional: Negative.   HENT: Negative.    Eyes: Negative.   Respiratory: Negative.    Cardiovascular: Negative.   Gastrointestinal: Negative.   Endocrine: Negative.   Genitourinary: Negative.   Musculoskeletal: Negative.   Skin: Negative.   Allergic/Immunologic: Negative.   Neurological: Negative.   Hematological: Negative.   Psychiatric/Behavioral: Negative.    All other systems reviewed and are negative.    Objective:   Vitals:   05/12/23 1456  BP: 114/72  Pulse: 99  Resp: 16  Temp: 98.3 F (36.8 C)  TempSrc: Oral  SpO2: 94%  Weight: 228 lb 8 oz (103.6 kg)  Height: 5\' 3"  (1.6 m)    Body mass index is 40.48 kg/m.  Physical Exam Vitals and nursing note reviewed.  Constitutional:      General: She is not in acute distress.    Appearance: Normal appearance. She is well-developed. She is obese. She is not ill-appearing, toxic-appearing or diaphoretic.  HENT:      Head: Normocephalic and atraumatic.     Nose: Nose normal.  Eyes:     General:        Right eye: No discharge.        Left eye: No discharge.     Conjunctiva/sclera: Conjunctivae normal.  Neck:     Trachea: No tracheal deviation.  Cardiovascular:     Rate and Rhythm: Normal rate and regular rhythm.     Pulses: Normal pulses.     Heart sounds: Normal heart sounds.  Pulmonary:     Effort: Pulmonary effort is normal. No respiratory distress.     Breath sounds: Normal breath sounds. No stridor. No wheezing, rhonchi or rales.  Skin:    General: Skin is warm and dry.     Findings: No rash.  Neurological:     Mental Status: She is alert.     Motor: No abnormal muscle tone.     Coordination: Coordination normal.  Psychiatric:        Mood and Affect: Mood normal.        Behavior: Behavior normal.         Assessment & Plan:     ICD-10-CM   1. Type 2 diabetes mellitus without complication, without long-term current use of insulin (HCC)  E11.9  COMPLETE METABOLIC PANEL WITH GFR    Hemoglobin A1c   A1C had increased, pt tolerating metformin, will come to recheck labs after 3 months    2. Hypertension, benign essential, goal below 140/90  I10 COMPLETE METABOLIC PANEL WITH GFR   BP at goal today    3. Mixed hyperlipidemia  E78.2 COMPLETE METABOLIC PANEL WITH GFR   labs recently checked, well controlled, no changes    4. Encounter for completion of form with patient  Z02.89    needs letter stating spouse is caring for her and other disabled members of family for DSS    5. Paroxysmal supraventricular tachycardia (HCC)  I47.10    new dose and form of metoprolol is working well w/o as many SE, refills ordered    6. Chronic use of opiate for therapeutic purpose  Z79.891    needs new referral [facility] - will send me info    7. Postmenopausal bleeding  N95.0 Ambulatory referral to Obstetrics / Gynecology   some spotting after no menses for 18 months    8. Pain of left breast  N64.4  MM Digital Diagnostic Bilat    US BREAST COMPLETE UNI LEFT INC AXILLA    9. Breast swelling  N63.0 MM Digital Diagnostic Bilat    US BREAST COMPLETE UNI LEFT INC AXILLA     #7-9 may be related to stopping provera - referred back to gyn     Danelle Berry, PA-C 05/12/23 3:27 PM

## 2023-05-15 ENCOUNTER — Ambulatory Visit
Admission: RE | Admit: 2023-05-15 | Discharge: 2023-05-15 | Disposition: A | Discharge status: Home / Self Care | Payer: Medicaid Other | Source: Ambulatory Visit | Attending: Otolaryngology | Admitting: Otolaryngology

## 2023-05-15 DIAGNOSIS — E041 Nontoxic single thyroid nodule: Secondary | ICD-10-CM

## 2023-05-16 ENCOUNTER — Ambulatory Visit: Payer: Medicaid Other | Admitting: Family Medicine

## 2023-05-20 ENCOUNTER — Ambulatory Visit: Payer: Medicaid Other | Admitting: Gastroenterology

## 2023-05-20 ENCOUNTER — Encounter: Payer: Self-pay | Admitting: Gastroenterology

## 2023-05-20 ENCOUNTER — Other Ambulatory Visit: Payer: Self-pay | Admitting: Family Medicine

## 2023-05-20 VITALS — BP 122/76 | HR 97 | Temp 98.2°F | Wt 225.8 lb

## 2023-05-20 DIAGNOSIS — K648 Other hemorrhoids: Secondary | ICD-10-CM | POA: Diagnosis not present

## 2023-05-20 DIAGNOSIS — I471 Supraventricular tachycardia, unspecified: Secondary | ICD-10-CM

## 2023-05-20 NOTE — Progress Notes (Unsigned)
Patient follow-ups today for banding of hemorrhoids    Summary of history :   H/o internal hemorroids with bleeding , colonoscopy confirmed same in 10/2022.  She has been having episodes of bleeding painless would like to get her hemorrhoids treated   Digital rectal exam performed in the presence of a chaperone. External anal findings: Prolapsing hemorrhoids as well as skin tags Internal findings: , No masses, no blood on glove noticed.    PROCEDURE NOTE: The patient presents with symptomatic grade 2 hemorrhoids, unresponsive to maximal medical therapy, requesting rubber band ligation of his/her hemorrhoidal disease.  All risks, benefits and alternative forms of therapy were described and informed consent was obtained.  In the Left Lateral Decubitus position (if anoscopy is performed) anoscopic examination revealed grade 2 hemorrhoids in the all position(s).   The decision was made to band the RA internal hemorrhoid, and the [facility] O'Regan System was used to perform band ligation without complication.  Digital anorectal examination was then performed to assure proper positioning of the band, and to adjust the banded tissue as required.  The patient was discharged home without pain or other issues.  Dietary and behavioral recommendations were given and (if necessary - prescriptions were given), along with follow-up instructions.  The patient will return 4 weeks for follow-up and possible additional banding as required.  No complications were encountered and the patient tolerated the procedure well.   Plan:  Avoid constipation.    Follow-up: 4 weeks  Dr Wyline Mood MD,MRCP [facility]) Gastroenterology/Hepatology Pager: [phone]

## 2023-05-21 ENCOUNTER — Ambulatory Visit
Admission: RE | Admit: 2023-05-21 | Discharge: 2023-05-21 | Disposition: A | Discharge status: Home / Self Care | Payer: Medicaid Other | Source: Ambulatory Visit | Attending: Family Medicine | Admitting: Family Medicine

## 2023-05-21 DIAGNOSIS — N63 Unspecified lump in unspecified breast: Secondary | ICD-10-CM | POA: Diagnosis not present

## 2023-05-21 DIAGNOSIS — N644 Mastodynia: Secondary | ICD-10-CM | POA: Insufficient documentation

## 2023-05-21 DIAGNOSIS — D242 Benign neoplasm of left breast: Secondary | ICD-10-CM | POA: Diagnosis not present

## 2023-05-21 DIAGNOSIS — N6325 Unspecified lump in the left breast, overlapping quadrants: Secondary | ICD-10-CM | POA: Diagnosis not present

## 2023-05-21 DIAGNOSIS — R92333 Mammographic heterogeneous density, bilateral breasts: Secondary | ICD-10-CM | POA: Diagnosis not present

## 2023-05-22 ENCOUNTER — Other Ambulatory Visit: Payer: Self-pay | Admitting: Family Medicine

## 2023-05-22 DIAGNOSIS — R928 Other abnormal and inconclusive findings on diagnostic imaging of breast: Secondary | ICD-10-CM

## 2023-05-22 DIAGNOSIS — N63 Unspecified lump in unspecified breast: Secondary | ICD-10-CM

## 2023-05-23 ENCOUNTER — Telehealth: Payer: Self-pay | Admitting: *Deleted

## 2023-05-23 NOTE — Telephone Encounter (Signed)
Sephora Colello (Key: [id number]) - [id number] Vyepti 100MG /ML solution Status: PA Response - ApprovedCreated: August 23rd, 2024Sent: August 23rd, 2024

## 2023-05-28 ENCOUNTER — Ambulatory Visit
Admission: RE | Admit: 2023-05-28 | Discharge: 2023-05-28 | Disposition: A | Discharge status: Home / Self Care | Payer: Medicaid Other | Source: Ambulatory Visit | Attending: Family Medicine | Admitting: Family Medicine

## 2023-05-28 DIAGNOSIS — N6325 Unspecified lump in the left breast, overlapping quadrants: Secondary | ICD-10-CM | POA: Diagnosis not present

## 2023-05-28 DIAGNOSIS — R928 Other abnormal and inconclusive findings on diagnostic imaging of breast: Secondary | ICD-10-CM

## 2023-05-28 DIAGNOSIS — N63 Unspecified lump in unspecified breast: Secondary | ICD-10-CM | POA: Diagnosis present

## 2023-05-28 HISTORY — PX: BREAST BIOPSY: SHX20

## 2023-05-28 MED ORDER — LIDOCAINE 1 % OPTIME INJ - NO CHARGE
5.0000 mL | Freq: Once | INTRAMUSCULAR | Status: AC
  Administered 2023-05-28: 5 mL
  Filled 2023-05-28: qty 6

## 2023-05-29 ENCOUNTER — Telehealth: Payer: Self-pay

## 2023-05-29 NOTE — Telephone Encounter (Signed)
Vyepti 100 mg/ml vial approved 05/23/23-05/22/24

## 2023-06-03 ENCOUNTER — Other Ambulatory Visit (HOSPITAL_COMMUNITY): Payer: Self-pay

## 2023-06-09 ENCOUNTER — Encounter: Payer: Self-pay | Admitting: Obstetrics & Gynecology

## 2023-06-10 ENCOUNTER — Encounter: Payer: Medicaid Other | Admitting: Obstetrics & Gynecology

## 2023-06-17 ENCOUNTER — Other Ambulatory Visit: Payer: Self-pay | Admitting: Pharmacy Technician

## 2023-06-17 ENCOUNTER — Telehealth: Payer: Self-pay | Admitting: Pharmacy Technician

## 2023-06-17 ENCOUNTER — Other Ambulatory Visit (HOSPITAL_COMMUNITY): Payer: Self-pay

## 2023-06-17 NOTE — Telephone Encounter (Signed)
Auth Submission: APPROVED Site of care: Site of care: [facility] Payer: [employer] Medication & CPT/J Code(s) submitted: Vyepti (Eptinezumab) [phone] Route of submission (phone, fax, portal):  Phone # Fax # Auth type: Pharmacy Benefit Units/visits requested: 300MG  Q3 MONTHS Reference number: [id number] Approval from: 05/23/23 to 05/22/24    Test claim:  approved x3 vials for 90 days supply. Rep: Chris-C. 1:01p

## 2023-06-21 ENCOUNTER — Encounter (HOSPITAL_COMMUNITY): Payer: Self-pay

## 2023-06-23 ENCOUNTER — Ambulatory Visit: Payer: Medicaid Other

## 2023-06-23 ENCOUNTER — Ambulatory Visit: Payer: Medicaid Other | Admitting: Gastroenterology

## 2023-06-23 VITALS — BP 107/70 | HR 99 | Temp 97.5°F | Resp 16 | Ht 63.0 in | Wt 227.0 lb

## 2023-06-23 VITALS — BP 120/77 | HR 106 | Temp 98.3°F | Ht 63.0 in | Wt 227.2 lb

## 2023-06-23 DIAGNOSIS — K648 Other hemorrhoids: Secondary | ICD-10-CM

## 2023-06-23 DIAGNOSIS — G43711 Chronic migraine without aura, intractable, with status migrainosus: Secondary | ICD-10-CM | POA: Diagnosis not present

## 2023-06-23 MED ORDER — TRULANCE 3 MG PO TABS: 3.0000 mg | ORAL_TABLET | Freq: Every day | ORAL | 1 refills | Status: DC

## 2023-06-23 MED ORDER — SODIUM CHLORIDE 0.9 % IV SOLN
300.0000 mg | Freq: Once | INTRAVENOUS | Status: AC
  Administered 2023-06-23: 300 mg via INTRAVENOUS
  Filled 2023-06-23: qty 3

## 2023-06-23 NOTE — Addendum Note (Signed)
Addended by: Tawnya Crook on: 06/23/2023 01:53 PM   Modules accepted: Orders

## 2023-06-23 NOTE — Progress Notes (Signed)
Patient follow-ups today for banding of hemorrhoids    Summary of history :  H/o internal hemorroids with bleeding , colonoscopy confirmed same in 10/2022.  She has been having episodes of bleeding painless would like to get her hemorrhoids treated   First round:05/20/2023 right anterior column banded    Interval history   She did well after her last procedure.  Reduced bleeding  Digital rectal exam performed in the presence of a chaperone. External anal findings: Skin tags Internal findings: Chaperone present, No masses, no blood on glove noticed.    PROCEDURE NOTE: The patient presents with symptomatic grade 1 hemorrhoids, unresponsive to maximal medical therapy, requesting rubber band ligation of his/her hemorrhoidal disease.  All risks, benefits and alternative forms of therapy were described and informed consent was obtained.  In the Left Lateral Decubitus position (if anoscopy is performed) anoscopic examination revealed grade 1 hemorrhoids in the LL position(s).   The decision was made to band the LLnternal hemorrhoid, and the [facility] System was used to perform band ligation without complication.  Digital anorectal examination was then performed to assure proper positioning of the band, and to adjust the banded tissue as required.  The patient was discharged home without pain or other issues.  Dietary and behavioral recommendations were given and (if necessary - prescriptions were given), along with follow-up instructions.  The patient will return 4weeks for follow-up and possible additional banding as required.  No complications were encountered and the patient tolerated the procedure well.   Plan:  Avoid constipation.   Follow-up: 4 weeks  Dr Wyline Mood MD,MRCP [facility]) Gastroenterology/Hepatology Pager: [phone]

## 2023-06-23 NOTE — Progress Notes (Signed)
Diagnosis: Migraine  Provider:  Chilton Greathouse MD  Procedure: IV Infusion  IV Type: Peripheral, IV Location: R Antecubital  Vyepti (Eptinezumab-jjmr), Dose: 300 mg  Infusion Start Time: 1014  Infusion Stop Time: 1044  Post Infusion IV Care: Peripheral IV Discontinued  Discharge: Condition: Good, Destination: Home . AVS Declined  Performed by:  Rico Ala, LPN

## 2023-06-30 ENCOUNTER — Encounter: Payer: Self-pay | Admitting: Physical Medicine and Rehabilitation

## 2023-06-30 ENCOUNTER — Encounter
Payer: Medicaid Other | Attending: Physical Medicine and Rehabilitation | Admitting: Physical Medicine and Rehabilitation

## 2023-06-30 VITALS — BP 119/80 | HR 104 | Ht 63.0 in | Wt 226.0 lb

## 2023-06-30 DIAGNOSIS — F411 Generalized anxiety disorder: Secondary | ICD-10-CM | POA: Insufficient documentation

## 2023-06-30 DIAGNOSIS — K589 Irritable bowel syndrome without diarrhea: Secondary | ICD-10-CM | POA: Insufficient documentation

## 2023-06-30 DIAGNOSIS — R109 Unspecified abdominal pain: Secondary | ICD-10-CM | POA: Insufficient documentation

## 2023-06-30 DIAGNOSIS — G43001 Migraine without aura, not intractable, with status migrainosus: Secondary | ICD-10-CM | POA: Insufficient documentation

## 2023-06-30 DIAGNOSIS — G4701 Insomnia due to medical condition: Secondary | ICD-10-CM | POA: Diagnosis not present

## 2023-06-30 MED ORDER — QULIPTA 30 MG PO TABS: 1.0000 | ORAL_TABLET | Freq: Every day | ORAL | 3 refills | Status: DC | PRN

## 2023-06-30 NOTE — Patient Instructions (Signed)
5-HTP Tryptophan spinach, [place] saffron

## 2023-06-30 NOTE — Progress Notes (Signed)
Subjective:    Patient ID: Dana Bradley, female    DOB: Dec 08, 1970, 52 y.o.   MRN: [id number]  HPI  An audio/video tele-health visit is felt to be the most appropriate encounter for this patient at this time. This is a follow up tele-visit via phone. The patient is at home. MD is at office. Prior to scheduling this appointment, our staff discussed the limitations of evaluation and management by telemedicine and the availability of in-person appointments. The patient expressed understanding and agreed to proceed.   1) Chronic Migraine:  -Dana Bradley a 52 year old woman who presents for f/u of her chronic migraine  -she had excruciating pain yesterday -has been stressed -tens unit -still tries to avoid eggs, milk -[name] was helping -discussed her food allergy test results -took the Nurtec and the Fioricet last night and her migraine got better -she thinks she been dehydrated -the [name] has helped -she thinks the Vyepti has helped  -migraines have been decent over the last few weeks -she thinks Vyepti helps -she is trying not to be stressed -Dana Bradley went to the cardiologist today and they are doing a cardiac MRI, and her fatigue is worse, she was told her heart is not pumping well. --she would like to try Zavzpret, did not help -she has ordered Bioptemizer's magnesium -she has been having stress due to her mother's Alzheimer's disease -she has been very fatigued during the day -she cannot be in light -on Monday she had to wear an eye mask in a darkened room. -Migraines will not get better unless she takes the Fioricet with the maxaalt but it puts her to sleepy -she feels she is getting a migraine every day -the Fioricet and Nurtec or Maxaalt together give her relief, but not complete relief.  -the days run into each other and its hard to remember the differences between them -she has been stressed in caring for her mother -she has noted recently that when she lays flat she feels  fine but as soon as she sits up she feels an excruciating migraine -migraine continues to worsen from sitting to standing position -her daughter has similar symptoms but felt better after starting acetazolamide for presumed diagnosis of intracranial hypertension. She asks whether she may benefit from this medication as well.  Terrible stomach spasms and diarrhea. 12 hours later debilitating migraine. It usually starts in the afternoon. She feels that this may be part of her prodrome. She also has terrible vertigo that is getting worse. The Fiorcet helps a lot, the Migronal helps with the frontal headache. These make the pain bearable.  -having migraines close to every 24H -she is not functional -she is not sure if Maxaalt is helping. She is not taking with the Nurtec. It is helping some but she is not sure how much. The fioricet alone helps by putting her to sleep.  -she has not heart any news about the Vypeti infusion. The medication was approved  -has been having more dizziness.  -the pain has been worst since she had the occipital nerve blocks.  -she knows it is stress related -she feels a pain radiating down her arm as well. It also runs into her head and into her face.  -Sometimes her whole left side is fizzy.  -she has tried Amitriptyline.  -have been awful since December. Had a migraine from Dec 1st throughout 9th.  -the day before she was incapacitated.  -Vyepti has been approved -The nurtec and the Fiorocet help together to  break the migraine.  -she needs a refill of her Fioricet and nausea, she finds this helpful.  -Nurtec- she is still working with the company to get this improved -she asks about prodrome and postdrome (she experiences cramping and diarrhea).  -on Sunday she closed out a 56 hour migraines. -she has been trying to follow ketogenic diet.  -Nurtec helping- 5 days in a row without a migraines.  -she feels if she could take it as a rescue medication in addition to  every other day this may help.   2) Vertigo -Symptoms are worsening.  -She feels that this may be leading up to the vertigo.  -She is interested in vestibular therapy.  -she had a heating pad on the shoulder and arm.   3) Cervical myofasical pain syndrome: -Muscles of neck are very tight, pain is worse on the left side.  -She did not find spinal injections helpful, they made the pain worse.   4) Insomnia: she sleeps in chunks -she is willing to increase amitriptyline to 50mg .   5) Cognitive impairments: -she loves to cook and noticed she has been making more mistakes -she thinks the headaches are a problem.  6) Pain radiating from back into legs -feels twitches -worse at night -legs kick out at night.   7) IBS -terrible in the night -this is still an issue -2am on Sunday. -horrible pain -often with diarrhea and improves symptoms after she has diarrhea.  -cramping.  -last normal BM is 1/9 -having abdominal distension -magnesium citrate does not help -miralax multiple times per day do not help  9) depression: -she has dry mouth will wellbutrin and did not find much efficacy. She asked if there is an alternative medication she can try -she had tried Zoloft in the past with benefits -she is going to pick up the Zoloft today and try it tonight -she would be willing to take vitamin D  10) Low back pain: -radiates into her feet -not a good surgical candidate -she feels she will be wheelchair bound soon  11. Stress: -Dana Bradley is going to be in the hospital  Mrs. Bradley is a 52 year old woman who presents for follow-up of her chronic migraine. Her average pain is 6/10 and pain right now is 6/10. Her pain is intermittent, sharp, burning, stabbing, tingling, and aching.   She recently had 7 days without migraine. She is not sure what was associated with this. She had a pretty terrible bounceback.  She picked up the Migranal nasal spray. Our CMA explained how it works.   Her  pain doctor is going to stop prescribing the Lyrica and she needs to find a new doctor to prescribe this.    Pain Inventory Average Pain 6 Pain Right Now 5 My pain is intermittent, sharp, burning, stabbing, tingling, and aching  In the last 24 hours, has pain interfered with the following? General activity 9 Relation with others 10 Enjoyment of life 10 What TIME of day is your pain at its worst? morning , daytime, evening, and night Sleep (in general) Poor  Pain is worse with: bending Pain improves with: rest, heat/ice, medication, and TENS Relief from Meds: fair    Family History  Problem Relation Age of Onset   Depression Mother    Hypertension Mother    Cancer Mother        Skin   Hyperlipidemia Mother    Anxiety disorder Mother    Migraines Mother    Alcohol abuse Father  Depression Father    Stroke Father    Heart disease Father    Hypertension Father    Anxiety disorder Father    Depression Sister    Hyperlipidemia Sister    Diabetes Sister    Hypertension Sister    Polycystic ovary syndrome Sister    Bipolar disorder Sister    Anxiety disorder Sister    Migraines Sister    Depression Sister    Hypertension Sister    Anxiety disorder Sister    Migraines Sister    Breast cancer Maternal Grandmother 86   Cancer Maternal Grandmother 68       Breast   Thyroid disease Maternal Grandmother    Arthritis Maternal Grandmother    Hyperlipidemia Maternal Grandmother    Aneurysm Maternal Grandfather    Hypertension Maternal Grandfather    Heart disease Maternal Grandfather    Alzheimer's disease Paternal Grandmother    Heart attack Paternal Grandfather    Hypertension Paternal Grandfather    COPD Paternal Grandfather    Heart disease Paternal Grandfather    Migraines Daughter    Other Daughter        leomyoa scaroma   Migraines Daughter    Migraines Son    Alzheimer's disease Other    Bladder Cancer Neg Hx    Kidney cancer Neg Hx    Social History    Socioeconomic History   Marital status: Married    Spouse name: brian   Number of children: 3   Years of education: Not on file   Highest education level: Associate degree: academic program  Occupational History   Occupation: disbled    Comment: not able  Tobacco Use   Smoking status: Former    Current packs/day: 0.00    Average packs/day: 2.0 packs/day for 3.0 years (6.0 ttl pk-yrs)    Types: Cigarettes    Start date: 12/10/1989    Quit date: 12/10/1992    Years since quitting: 30.5   Smokeless tobacco: Never   Tobacco comments:    quit 25 years ago  Vaping Use   Vaping status: Never Used  Substance and Sexual Activity   Alcohol use: No    Comment: socially   Drug use: No   Sexual activity: Not Currently    Partners: Male    Birth control/protection: Surgical  Other Topics Concern   Not on file  Social History Narrative   Lives at home with her husband and 2 of her children   Right handed   Caffeine: 0-2 cups daily   Social Determinants of Health   Financial Resource Strain: High Risk (08/29/2017)   Overall Financial Resource Strain (CARDIA)    Difficulty of Paying Living Expenses: Very hard  Food Insecurity: Food Insecurity Present (08/29/2017)   Hunger Vital Sign    Worried About Running Out of Food in the Last Year: Often true    Ran Out of Food in the Last Year: Often true  Transportation Needs: Unmet Transportation Needs (08/29/2017)   PRAPARE - Administrator, Civil Service (Medical): Yes    Lack of Transportation (Non-Medical): Yes  Physical Activity: Inactive (08/29/2017)   Exercise Vital Sign    Days of Exercise per Week: 0 days    Minutes of Exercise per Session: 0 min  Stress: Stress Concern Present (08/29/2017)   [employer] of Occupational Health - Occupational Stress Questionnaire    Feeling of Stress : Rather much  Social Connections: Unknown (10/16/2022)   Received from [facility],  [facility]   Social Network     Social Network: Not on file   Past Surgical History:  Procedure Laterality Date   ABLATION     Uterine   BREAST BIOPSY Left    2014 U/S bx fibroadenoma   BREAST BIOPSY Left 05/28/2023   lr Bradley bx 3:00 heart clip   BREAST BIOPSY Left 05/28/2023   Bradley LT BREAST BX W LOC DEV 1ST LESION IMG BX SPEC Bradley GUIDE 05/28/2023 [facility]   CARDIAC CATHETERIZATION     with ablation   COLONOSCOPY N/A 09/26/2022   Procedure: COLONOSCOPY;  Surgeon: Wyline Mood, MD;  Location: [facility] ENDOSCOPY;  Service: Gastroenterology;  Laterality: N/A;   COLONOSCOPY WITH PROPOFOL N/A 05/17/2015   Procedure: COLONOSCOPY WITH PROPOFOL;  Surgeon: Scot Jun, MD;  Location: [facility] ENDOSCOPY;  Service: Endoscopy;  Laterality: N/A;   COLONOSCOPY WITH PROPOFOL N/A 03/20/2020   Procedure: COLONOSCOPY WITH PROPOFOL;  Surgeon: Wyline Mood, MD;  Location: [facility] ENDOSCOPY;  Service: Gastroenterology;  Laterality: N/A;   COLONOSCOPY WITH PROPOFOL N/A 09/25/2022   Procedure: COLONOSCOPY WITH PROPOFOL;  Surgeon: Wyline Mood, MD;  Location: [facility] ENDOSCOPY;  Service: Gastroenterology;  Laterality: N/A;   COLONOSCOPY WITH PROPOFOL N/A 10/02/2022   Procedure: COLONOSCOPY WITH PROPOFOL;  Surgeon: Wyline Mood, MD;  Location: [facility] ENDOSCOPY;  Service: Gastroenterology;  Laterality: N/A;   COLONOSCOPY WITH PROPOFOL N/A 11/13/2022   Procedure: COLONOSCOPY WITH PROPOFOL;  Surgeon: Wyline Mood, MD;  Location: [facility] ENDOSCOPY;  Service: Gastroenterology;  Laterality: N/A;   ESOPHAGOGASTRODUODENOSCOPY N/A 05/17/2015   Procedure: ESOPHAGOGASTRODUODENOSCOPY (EGD);  Surgeon: Scot Jun, MD;  Location: [facility] ENDOSCOPY;  Service: Endoscopy;  Laterality: N/A;   ESOPHAGOGASTRODUODENOSCOPY N/A 09/25/2022   Procedure: ESOPHAGOGASTRODUODENOSCOPY (EGD);  Surgeon: Wyline Mood, MD;  Location: [facility] ENDOSCOPY;  Service: Gastroenterology;  Laterality: N/A;   ESOPHAGOGASTRODUODENOSCOPY N/A 09/26/2022   Procedure: ESOPHAGOGASTRODUODENOSCOPY (EGD);   Surgeon: Wyline Mood, MD;  Location: [facility] ENDOSCOPY;  Service: Gastroenterology;  Laterality: N/A;   KNEE ARTHROSCOPY     spg     6/18   Tibial Tubercle Bypass Right 1998   TUBAL LIGATION  10/01/1999   Past Surgical History:  Procedure Laterality Date   ABLATION     Uterine   BREAST BIOPSY Left    2014 U/S bx fibroadenoma   BREAST BIOPSY Left 05/28/2023   lr Bradley bx 3:00 heart clip   BREAST BIOPSY Left 05/28/2023   Bradley LT BREAST BX W LOC DEV 1ST LESION IMG BX SPEC Bradley GUIDE 05/28/2023 [facility]   CARDIAC CATHETERIZATION     with ablation   COLONOSCOPY N/A 09/26/2022   Procedure: COLONOSCOPY;  Surgeon: Wyline Mood, MD;  Location: [facility] ENDOSCOPY;  Service: Gastroenterology;  Laterality: N/A;   COLONOSCOPY WITH PROPOFOL N/A 05/17/2015   Procedure: COLONOSCOPY WITH PROPOFOL;  Surgeon: Scot Jun, MD;  Location: [facility] ENDOSCOPY;  Service: Endoscopy;  Laterality: N/A;   COLONOSCOPY WITH PROPOFOL N/A 03/20/2020   Procedure: COLONOSCOPY WITH PROPOFOL;  Surgeon: Wyline Mood, MD;  Location: [facility] ENDOSCOPY;  Service: Gastroenterology;  Laterality: N/A;   COLONOSCOPY WITH PROPOFOL N/A 09/25/2022   Procedure: COLONOSCOPY WITH PROPOFOL;  Surgeon: Wyline Mood, MD;  Location: [facility] ENDOSCOPY;  Service: Gastroenterology;  Laterality: N/A;   COLONOSCOPY WITH PROPOFOL N/A 10/02/2022   Procedure: COLONOSCOPY WITH PROPOFOL;  Surgeon: Wyline Mood, MD;  Location: [facility] ENDOSCOPY;  Service: Gastroenterology;  Laterality: N/A;   COLONOSCOPY WITH PROPOFOL N/A 11/13/2022   Procedure: COLONOSCOPY WITH PROPOFOL;  Surgeon: Wyline Mood, MD;  Location: [facility] ENDOSCOPY;  Service: Gastroenterology;  Laterality: N/A;   ESOPHAGOGASTRODUODENOSCOPY N/A 05/17/2015   Procedure: ESOPHAGOGASTRODUODENOSCOPY (EGD);  Surgeon: Scot Jun, MD;  Location: [facility] ENDOSCOPY;  Service: Endoscopy;  Laterality: N/A;   ESOPHAGOGASTRODUODENOSCOPY N/A 09/25/2022   Procedure: ESOPHAGOGASTRODUODENOSCOPY (EGD);  Surgeon: Wyline Mood,  MD;  Location: [facility] ENDOSCOPY;  Service: Gastroenterology;  Laterality: N/A;   ESOPHAGOGASTRODUODENOSCOPY N/A 09/26/2022   Procedure: ESOPHAGOGASTRODUODENOSCOPY (EGD);  Surgeon: Wyline Mood, MD;  Location: [facility] ENDOSCOPY;  Service: Gastroenterology;  Laterality: N/A;   KNEE ARTHROSCOPY     spg     6/18   Tibial Tubercle Bypass Right 1998   TUBAL LIGATION  10/01/1999   Past Medical History:  Diagnosis Date   Acute postoperative pain 04/07/2017   Anxiety    Bursitis    Chronic fatigue 12/12/2017   Chronic fatigue syndrome    Colitis 2021   Diabetes mellitus without complication (HCC)    Edema leg 05/02/2015   Fibromyalgia    GERD (gastroesophageal reflux disease)    IBS (irritable bowel syndrome)    Knee pain, bilateral 12/21/2008   Qualifier: Diagnosis of  By: Daphine Deutscher FNP, [place]     Lumbar discitis    Migraines    Osteoarthritis    Right hand pain 04/10/2015   [facility] has done nerve conduction studies and ruled out carpal tunnel.    Sleep apnea    Spinal stenosis    SVT (supraventricular tachycardia)    Vertigo    Vitamin D deficiency 05/01/2016   Ht 5\' 3"  (1.6 m)   Wt 226 lb (102.5 kg)   BMI 40.03 kg/m   Opioid Risk Score:   Fall Risk Score:  `1  Depression screen PHQ 2/9     06/30/2023    2:25 PM 05/12/2023    2:54 PM 02/13/2023    9:43 AM 02/04/2023   10:05 AM 01/07/2023    3:28 PM 12/24/2022    2:04 PM 12/09/2022   10:57 AM  Depression screen PHQ 2/9  Decreased Interest 1 2 3 2 2  3   Down, Depressed, Hopeless 1 3 3 2 2 1 3   PHQ - 2 Score 2 5 6 4 4 1 6   Altered sleeping  2 3  3 1 3   Tired, decreased energy  2 3  3  3   Change in appetite  2 1  3  3   Feeling bad or failure about yourself   2 0  3  3  Trouble concentrating  2 1  2  3   Moving slowly or fidgety/restless  0 0  2  2  Suicidal thoughts  0 0  0  0  PHQ-9 Score  15 14  20 2 23   Difficult doing work/chores  Very difficult Very difficult  Very difficult  Extremely dIfficult   Review of  Systems  Constitutional: Negative.   HENT: Negative.    Eyes: Negative.   Respiratory: Negative.    Cardiovascular: Negative.   Gastrointestinal: Negative.   Endocrine: Negative.   Genitourinary: Negative.   Musculoskeletal:  Positive for back pain and neck pain.       Left side of body from head to toes  Skin: Negative.   Allergic/Immunologic: Negative.   Neurological:  Positive for headaches. Negative for dizziness.  Hematological: Negative.   Psychiatric/Behavioral: Negative.    All other systems reviewed and are negative.      Objective:   Physical Exam Gen: no distress, normal appearing HEENT: oral mucosa pink and moist, NCAT Cardio: Reg rate Chest: normal effort,  normal rate of breathing Abd: soft, non-distended Ext: no edema Psych: pleasant, normal affect Skin: intact Neuro: alert and oriented x3     Assessment & Plan:  Mrs. Andriola is a 52 year old woman who presents for f/u with chronic intractable migraines s/p numerous treatments, severe fibromyalgia, and IBS, and nausea, and vertigo.     1) Vertigo: In the future, restart for cervical myofascial pain syndrome: myofascial release, postural correction, stretching and strengthening of the muscles of the neck and upper back, development of HEP. Conitnue heating pads to muscles of upper back and neck. She is doing HEP.  -referred for vestibular therapy.  -discussed current symptoms.  -discussed response to meclizine -discussed worsening symptoms when in car   2) Migraines:  -discussed food allergy results -discussed that highest priority is to eliminate gluten Continue Fioricet, which is one of the medications that helps her. Refilled today. She takes this at least once every time she gets a migraine. Advised to use upon migraine onset and to not use more frequently than q6H during migraine. Refilled Zofran for nausea last week, and this has been helping her. Continue metoprolol which can be helpful in migraine  prophylaxis (HR is well controlled). Prescribed ergot nasal spray to try upon migraine initiation- advised no more frequently than 4 sprays per hour (still awaiting on prior auth). Discussed avoiding foods that may trigger migraines.  Failed vypeti, ajovy, emgality, continue Vypeti trial, increase Vyepti to 300mg  -trial of Bioptemizer's magnesium supplement -recommended avoiding wheat and dairy  -discussed the elimination diet -prescribed Zavzpret -continue Qulipta daily prn.  -continue re-entry phase of elimination diet- she has noted chili pepper and eggs to be triggers for her -failed feverfew supplement -discussed lumbar puncture Does get some benefit from Maxalt and Fioricet in combination -takes metoprolol -ordered 95 item allergy test -discussed increasing Fioricet or Amitriptyline.  -recommended drinking a glass of water every morning before standing up as her symptoms sound like orthostatic hypotension which could worsen migraines -recommended checking blood pressure daily in supine, sitting, and standing positions and this should help Bradley identify if symptoms are from orthostatic hypotension or intracranial hypertension -discussed trying daily water first before trying medication acetazolamide.  -continue vitamin D  -Discussed that I can provide refill for her Lyrica when she needs it.  -topamax was not effective.  -When she gets the migraines her loss of words is getting worse.  -Ordered Vyepti- she will find out cost from her pharmacy. She will bring paperwork for this next visit. She is excited to try it.  -d/c nurtec since switched to maxalt -continue ear piercing. Pain feels more like pressure and less acidic.  -no benefits from Botox.  -continue to track migraines.  -Continue Baclofen for pain relief. Minimize use of Hydrocodone. She is only taking Lyrica once per day to make it last. Continue Migranal which is helping. Will occipital nerve block -Discussed that Mrs.  Lomanto's greatest source of happiness is her family. This community will be essential in helping her recover from her chronic pain and to increase her daily activity. Her daughter is also suffering from similar migraines unfortunately.  -Continue ginger, herbs, turmeric, blueberries, eating real food. Continue cutting down sugar. Using local honey is a great alternative. -Provided with a pain relief journal and discussed that it contains foods and lifestyle tips to naturally help to improve pain. Discussed that these lifestyle strategies are also very good for health unlike some medications which can have negative side effects. Discussed that the  act of keeping a journal can be therapeutic and helpful to realize patterns what helps to trigger and alleviate pain.   -Continue Nurtec every other day- may use for breakthrough migraines as well.  -Discussed plan for Vypeti at [facility] infusion center.  Discussed that exercise is one of the most effective treatments for fibromyalgia. This will also help with her obesity. Made goal with Mrs. Merrihew to walk outside her home at least once per day, and to garden at least once per day (her favorite activity). Can use elliptical which she has at home on rainy days. The heat has been oppressive and so she has been trying to do the latter more.  Foods to alleviate migraine:  1) dark leafy greens 2) avocado 3) tuna 4) samon and mackerel 5) beans and legumes Supplements that can be helpful: feverfew, B12, and magnesium  Foods to avoid in migraine: 1) Excessive (or irregular timing) coffee 2) red wine 3) aged cheeses 4) chocolate 5) citrus fruits 6) aspartame and other artifical sweeteners 7) yeast 8) MSG (in processed foods) 9) processed and cured meats 10) nuts and certain seeds 11) chicken livers and other organ meats 12) dairy products like buttermilk, sour cream, and yogurt 13) dried fruits like dates, figs, and raisins 14) garlic 15) onions 16)  potato chips 17) pickled foods like olives and sauerkraut 18) some fresh fruits like ripe banana, papaya, red plums, raspberries, kiwi, pineapple 19) tomato-based products  Recommend to keep a migraine diary: rate daily the severity of your headache (1-10) and what foods you eat that day to help determine patterns.    3) Cervical facet arthrosis: Cervical XR normal- discussed results with patient. Pain is worse on left side.   4) Anxiety and depression -Her daughter was recently diagnosed with sarcoma and this is a great source of stress and fear to Mrs. Suzie Portela. It has been a turbulent time for her and this has understandably worsened her symptoms. Discussed benefits of gratitude journaling and she plans to try this.  -discussed her life stressor, her mom's illness -discussed her difficult wean off Pristiq -prescribed zoloft -stop Wellbutrin -prescribed vitamin D -recommended Bioptemizer's magnesium breakthrough -recommended methylated multivitamin -discussed following with Dr. Kieth Brightly.  -increase amitriptyline to 150mg  HS, discussed that this helps her sleep but she has a hard time sleeping through the night.  -cymbalta caused suicidal ideation  5) Insomnia:  -failed trazodone.  -continue amitriptyline 150mg  HS -Try to go outside near sunrise -Get exercise during the day.  -Discussed good sleep hygiene: turning off all devices an hour before bedtime.  -Chamomile tea with dinner.  -Can consider over the counter melatonin -discussed magnesium  6) Nausea:  -continue zofran, discussed side effect of constipation -start B6 -discussed scopolamine patch, discussed to place behind ear and lave on 72 hours.    7) Cervical myofascial pain syndrome: Will try some trigger point injections with occipital nerve block next visit.  -increase amitriptyline to 100mg  HS  8) Concern for small bowel obstruction: -stat abdominal MRI ordered given no BM since 1/9.  -recommended to go to [facility]  to get this done stat -discussed that it would be beneficial to go to the ED.   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 Wynn Banker for [facility] -discussed that she has been told  she is no longer a candidate for injections, or surgery of spinal cord stimulator -Discussed Qutenza as an option for neuropathic pain control. Discussed that this is a capsaicin patch, stronger than capsaicin cream. Discussed that it is currently approved for diabetic peripheral neuropathy and post-herpetic neuralgia, but that it has also shown benefit in treating other forms of neuropathy. Provided patient with link to site to learn more about the patch: https://www.clark.biz/. Discussed that the patch would be placed in office and benefits usually last 3 months. Discussed that unintended exposure to capsaicin can cause severe irritation of eyes, mucous membranes, respiratory tract, and skin, but that Qutenza is a local treatment and does not have the systemic side effects of other nerve medications. Discussed that there may be pain, itching, erythema, and decreased sensory function associated with the application of Qutenza. Side effects usually subside within 1 week. A cold pack of analgesic medications can help with these side effects. Blood pressure can also be increased due to pain associated with administration of the patch.  unchanged. Shallow broad-based disc protrusion L4-5 unchanged. -continue Norco -continue turmeric Turmeric to reduce inflammation--can be used in cooking or taken as a supplement.  Benefits of turmeric:  -Highly anti-inflammatory  -Increases antioxidants  -Improves memory, attention, brain disease  -Lowers risk of heart disease  -May help prevent  cancer  -Decreases pain  -Alleviates depression  -Delays aging and decreases risk of chronic disease  -Consume with black pepper to increase absorption    Turmeric Milk Recipe:  1 cup milk  1 tsp turmeric  1 tsp cinnamon  1 tsp grated ginger (optional)  Black pepper (boosts the anti-inflammatory properties of turmeric).  1 tsp honey   12) Stress: -discussed the stress from Fiona's recent diagnosis and her mother's health decline -discussed that her daughter is not sleeping well  13. Abdominal pain:  -discussed that she had severe abdominal pain recently -discussed that magnesium citrate did not help -discussed that provera could contribute  14. Dry mouth: Discussed amitriptyline  >40 minutes spent in discussion of her dry mouth, family history of endometriosis, Quillipta prescribed for migraines, avoiding gluten and dairy, discussed migraines, stress, trialed tens unit

## 2023-07-01 ENCOUNTER — Telehealth: Payer: Self-pay

## 2023-07-01 NOTE — Telephone Encounter (Signed)
PA Case: [id number], Status: Approved, Coverage Starts on: 07/01/2023 12:00:00 AM, Coverage Ends on: 06/30/2024 12:00:00 AM.

## 2023-07-01 NOTE — Telephone Encounter (Signed)
PA for Qulipta submitted 

## 2023-07-04 ENCOUNTER — Other Ambulatory Visit: Payer: Self-pay | Admitting: Family Medicine

## 2023-07-08 ENCOUNTER — Other Ambulatory Visit: Payer: Self-pay | Admitting: Physical Medicine and Rehabilitation

## 2023-07-08 ENCOUNTER — Encounter: Payer: Self-pay | Admitting: Physical Medicine and Rehabilitation

## 2023-07-08 DIAGNOSIS — Z7409 Other reduced mobility: Secondary | ICD-10-CM

## 2023-08-01 ENCOUNTER — Encounter: Payer: Medicaid Other | Attending: Physical Medicine and Rehabilitation | Admitting: Physical Medicine & Rehabilitation

## 2023-08-01 ENCOUNTER — Encounter: Payer: Self-pay | Admitting: Physical Medicine & Rehabilitation

## 2023-08-01 VITALS — BP 111/77 | HR 110 | Ht 63.0 in | Wt 227.0 lb

## 2023-08-01 DIAGNOSIS — M533 Sacrococcygeal disorders, not elsewhere classified: Secondary | ICD-10-CM | POA: Insufficient documentation

## 2023-08-01 DIAGNOSIS — M797 Fibromyalgia: Secondary | ICD-10-CM | POA: Diagnosis not present

## 2023-08-01 NOTE — Progress Notes (Signed)
Subjective:    Patient ID: Dana Bradley, female    DOB: 12-13-1970, 52 y.o.   MRN: [id number]  HPI  CC:  Bilateral foot/toe pain as well as back pain  52 year old female with fibromyalgia syndrome referred by Dr. Carlis Abbott  for consultation regarding possible utility of spinal injections for pain.  The patient had been a long-term patient at [facility].  She has undergone multiple epidural injections at multiple levels.  Her more recent epidural was performed in March 2024 right L3-L4.  The patient has had no significant pain relief following that procedure.  The patient said that years ago epidurals were helpful for her but recently have not been. She has had no progressive weakness no constant numbness tingling.  Her pain score is around 5 out of 10 she states that she has pain primarily in the low back buttock and low lower extremities.  She has some tingling and burning as well.  She has no waxing and waning of symptoms during the day versus night.  She has poor sleep.  The pain is worse with walking bending sitting standing.  For exercise she does some walking but not on a consistent basis.  She states that when she feels good she tends to overdo this and then ends up having more pain the following day. Epidural injections last done  The patient has multiple drug allergies including gadolinium but does not have any allergy to CT contrast dye i.e. iodinated In addition to epidural injections the patient has had lumbar facet injections as well as radiofrequency neurotomy the patient states that this was not helpful for her. The patient does not recall specifically sacroiliac injections but thinks she may have had them in the past. TECHNIQUE: Multiplanar, multisequence MR imaging of the lumbar spine was performed. No intravenous contrast was administered.   COMPARISON:  03/15/2020   FINDINGS: Segmentation: 5 lumbar type vertebral bodies as numbered previously.   Alignment:   Straightening of the normal lumbar lordosis.   Vertebrae:  No fracture or focal bone lesion.   Conus medullaris and cauda equina: Conus extends to the L1 level. Conus and cauda equina appear normal.   Paraspinal and other soft tissues: Negative   Disc levels:   T12-L1: Small central disc bulge, similar to the previous study. No stenosis.   L1-2: Central to slightly right-sided disc herniation indents the thecal sac. This is similar to the previous study. No apparent neural compression.   L2-3: Normal interspace.   L3-4: Minimal desiccation of the disc. Right foraminal to extraforaminal disc herniation is newly seen and could affect the right L3 nerve.   L4-5: Desiccation and mild bulging of the disc. No change. No compressive stenosis.   L5-S1: Normal interspace.   IMPRESSION: Newly seen right lateral foraminal to extraforaminal disc herniation at L3-4 which could affect the right L3 nerve.   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 Indentation of the thecal sac but no apparent neural compression or change since 2021.     Electronically Signed   By: Paulina Fusi M.D.   On: 12/27/2021 12:42 Pain Inventory Average Pain 5 Pain Right Now 6 My pain is intermittent, sharp, burning, tingling, and aching  In the last 24 hours, has pain interfered with the following? General activity 6 Relation with others 8 Enjoyment of life 8 What TIME of day is your pain at its worst? morning , daytime, evening, and night Sleep (in general) Poor  Pain is worse with: walking, bending, sitting, and standing Pain improves with: rest, pacing activities, medication, TENS, and injections Relief from Meds: 5  Family History  Problem Relation Age of Onset   Depression Mother    Hypertension Mother    Cancer Mother        Skin   Hyperlipidemia Mother    Anxiety disorder Mother    Migraines Mother    Alcohol abuse Father    Depression  Father    Stroke Father    Heart disease Father    Hypertension Father    Anxiety disorder Father    Depression Sister    Hyperlipidemia Sister    Diabetes Sister    Hypertension Sister    Polycystic ovary syndrome Sister    Bipolar disorder Sister    Anxiety disorder Sister    Migraines Sister    Depression Sister    Hypertension Sister    Anxiety disorder Sister    Migraines Sister    Breast cancer Maternal Grandmother 60   Cancer Maternal Grandmother 56       Breast   Thyroid disease Maternal Grandmother    Arthritis Maternal Grandmother    Hyperlipidemia Maternal Grandmother    Aneurysm Maternal Grandfather    Hypertension Maternal Grandfather    Heart disease Maternal Grandfather    Alzheimer's disease Paternal Grandmother    Heart attack Paternal Grandfather    Hypertension Paternal Grandfather    COPD Paternal Grandfather    Heart disease Paternal Grandfather    Migraines Daughter    Other Daughter        leomyoa scaroma   Migraines Daughter    Migraines Son    Alzheimer's disease Other    Bladder Cancer Neg Hx    Kidney cancer Neg Hx    Social History   Socioeconomic History   Marital status: Married    Spouse name: brian   Number of children: 3   Years of education: Not on file   Highest education level: Associate degree: academic program  Occupational History   Occupation: disbled    Comment: not able  Tobacco Use   Smoking status: Former    Current packs/day: 0.00    Average packs/day: 2.0 packs/day for 3.0 years (6.0 ttl pk-yrs)    Types: Cigarettes    Start date: 12/10/1989    Quit date: 12/10/1992    Years since quitting: 30.6   Smokeless tobacco: Never   Tobacco comments:    quit 25 years ago  Vaping Use   Vaping status: Never Used  Substance and Sexual Activity   Alcohol use: No    Comment: socially   Drug use: No   Sexual activity: Not Currently    Partners: Male    Birth control/protection: Surgical  Other Topics Concern   Not on  file  Social History Narrative   Lives at home with her husband and 2 of her children   Right handed   Caffeine: 0-2 cups daily   Social Determinants of Health   Financial Resource Strain: High Risk (08/29/2017)   Overall Financial Resource Strain (CARDIA)    Difficulty of Paying Living Expenses: Very hard  Food Insecurity: Food Insecurity Present (08/29/2017)   Hunger Vital Sign    Worried About Running Out of Food in the Last Year: Often true    Ran Out of Food in the Last Year: Often true  Transportation Needs: Unmet Transportation Needs (08/29/2017)   PRAPARE - Transportation    Lack  of Transportation (Medical): Yes    Lack of Transportation (Non-Medical): Yes  Physical Activity: Inactive (08/29/2017)   Exercise Vital Sign    Days of Exercise per Week: 0 days    Minutes of Exercise per Session: 0 min  Stress: Stress Concern Present (08/29/2017)   [employer] of Occupational Health - Occupational Stress Questionnaire    Feeling of Stress : Rather much  Social Connections: Unknown (10/16/2022)   [facility] from [facility]   Social Network    Social Network: Not on file   Past Surgical History:  Procedure Laterality Date   ABLATION     Uterine   BREAST BIOPSY Left    2014 U/S bx fibroadenoma   BREAST BIOPSY Left 05/28/2023   lr [place] bx 3:00 heart clip   BREAST BIOPSY Left 05/28/2023   [place] LT BREAST BX W LOC DEV 1ST LESION IMG BX SPEC US GUIDE 05/28/2023 [facility]   CARDIAC CATHETERIZATION     with ablation   COLONOSCOPY N/A 09/26/2022   Procedure: COLONOSCOPY;  Surgeon: Wyline Mood, MD;  Location: [facility] ENDOSCOPY;  Service: Gastroenterology;  Laterality: N/A;   COLONOSCOPY WITH PROPOFOL N/A 05/17/2015   Procedure: COLONOSCOPY WITH PROPOFOL;  Surgeon: Scot Jun, MD;  Location: [facility] ENDOSCOPY;  Service: Endoscopy;  Laterality: N/A;   COLONOSCOPY WITH PROPOFOL N/A 03/20/2020   Procedure: COLONOSCOPY WITH PROPOFOL;  Surgeon: Wyline Mood, MD;   Location: [facility] ENDOSCOPY;  Service: Gastroenterology;  Laterality: N/A;   COLONOSCOPY WITH PROPOFOL N/A 09/25/2022   Procedure: COLONOSCOPY WITH PROPOFOL;  Surgeon: Wyline Mood, MD;  Location: [facility] ENDOSCOPY;  Service: Gastroenterology;  Laterality: N/A;   COLONOSCOPY WITH PROPOFOL N/A 10/02/2022   Procedure: COLONOSCOPY WITH PROPOFOL;  Surgeon: Wyline Mood, MD;  Location: [facility] ENDOSCOPY;  Service: Gastroenterology;  Laterality: N/A;   COLONOSCOPY WITH PROPOFOL N/A 11/13/2022   Procedure: COLONOSCOPY WITH PROPOFOL;  Surgeon: Wyline Mood, MD;  Location: [facility] ENDOSCOPY;  Service: Gastroenterology;  Laterality: N/A;   ESOPHAGOGASTRODUODENOSCOPY N/A 05/17/2015   Procedure: ESOPHAGOGASTRODUODENOSCOPY (EGD);  Surgeon: Scot Jun, MD;  Location: [facility] ENDOSCOPY;  Service: Endoscopy;  Laterality: N/A;   ESOPHAGOGASTRODUODENOSCOPY N/A 09/25/2022   Procedure: ESOPHAGOGASTRODUODENOSCOPY (EGD);  Surgeon: Wyline Mood, MD;  Location: [facility] ENDOSCOPY;  Service: Gastroenterology;  Laterality: N/A;   ESOPHAGOGASTRODUODENOSCOPY N/A 09/26/2022   Procedure: ESOPHAGOGASTRODUODENOSCOPY (EGD);  Surgeon: Wyline Mood, MD;  Location: [facility] ENDOSCOPY;  Service: Gastroenterology;  Laterality: N/A;   KNEE ARTHROSCOPY     spg     6/18   Tibial Tubercle Bypass Right 1998   TUBAL LIGATION  10/01/1999   Past Surgical History:  Procedure Laterality Date   ABLATION     Uterine   BREAST BIOPSY Left    2014 U/S bx fibroadenoma   BREAST BIOPSY Left 05/28/2023   lr [place] bx 3:00 heart clip   BREAST BIOPSY Left 05/28/2023   [place] LT BREAST BX W LOC DEV 1ST LESION IMG BX SPEC US GUIDE 05/28/2023 [facility]   CARDIAC CATHETERIZATION     with ablation   COLONOSCOPY N/A 09/26/2022   Procedure: COLONOSCOPY;  Surgeon: Wyline Mood, MD;  Location: [facility] ENDOSCOPY;  Service: Gastroenterology;  Laterality: N/A;   COLONOSCOPY WITH PROPOFOL N/A 05/17/2015   Procedure: COLONOSCOPY WITH PROPOFOL;  Surgeon: Scot Jun, MD;   Location: [facility] ENDOSCOPY;  Service: Endoscopy;  Laterality: N/A;   COLONOSCOPY WITH PROPOFOL N/A 03/20/2020   Procedure: COLONOSCOPY WITH PROPOFOL;  Surgeon: Wyline Mood, MD;  Location: [facility] ENDOSCOPY;  Service: Gastroenterology;  Laterality: N/A;  COLONOSCOPY WITH PROPOFOL N/A 09/25/2022   Procedure: COLONOSCOPY WITH PROPOFOL;  Surgeon: Wyline Mood, MD;  Location: [facility] ENDOSCOPY;  Service: Gastroenterology;  Laterality: N/A;   COLONOSCOPY WITH PROPOFOL N/A 10/02/2022   Procedure: COLONOSCOPY WITH PROPOFOL;  Surgeon: Wyline Mood, MD;  Location: [facility] ENDOSCOPY;  Service: Gastroenterology;  Laterality: N/A;   COLONOSCOPY WITH PROPOFOL N/A 11/13/2022   Procedure: COLONOSCOPY WITH PROPOFOL;  Surgeon: Wyline Mood, MD;  Location: [facility] ENDOSCOPY;  Service: Gastroenterology;  Laterality: N/A;   ESOPHAGOGASTRODUODENOSCOPY N/A 05/17/2015   Procedure: ESOPHAGOGASTRODUODENOSCOPY (EGD);  Surgeon: Scot Jun, MD;  Location: [facility] ENDOSCOPY;  Service: Endoscopy;  Laterality: N/A;   ESOPHAGOGASTRODUODENOSCOPY N/A 09/25/2022   Procedure: ESOPHAGOGASTRODUODENOSCOPY (EGD);  Surgeon: Wyline Mood, MD;  Location: [facility] ENDOSCOPY;  Service: Gastroenterology;  Laterality: N/A;   ESOPHAGOGASTRODUODENOSCOPY N/A 09/26/2022   Procedure: ESOPHAGOGASTRODUODENOSCOPY (EGD);  Surgeon: Wyline Mood, MD;  Location: [facility] ENDOSCOPY;  Service: Gastroenterology;  Laterality: N/A;   KNEE ARTHROSCOPY     spg     6/18   Tibial Tubercle Bypass Right 1998   TUBAL LIGATION  10/01/1999   Past Medical History:  Diagnosis Date   Acute postoperative pain 04/07/2017   Anxiety    Bursitis    Chronic fatigue 12/12/2017   Chronic fatigue syndrome    Colitis 2021   Diabetes mellitus without complication (HCC)    Edema leg 05/02/2015   Fibromyalgia    GERD (gastroesophageal reflux disease)    IBS (irritable bowel syndrome)    Knee pain, bilateral 12/21/2008   Qualifier: Diagnosis of  By: Daphine Deutscher FNP, [place]     Lumbar discitis     Migraines    Osteoarthritis    Right hand pain 04/10/2015   [facility] has done nerve conduction studies and ruled out carpal tunnel.    Sleep apnea    Spinal stenosis    SVT (supraventricular tachycardia) (HCC)    Vertigo    Vitamin D deficiency 05/01/2016   BP 111/77   Pulse (!) 110   Ht 5\' 3"  (1.6 m)   Wt 227 lb (103 kg)   SpO2 96%   BMI 40.21 kg/m   Opioid Risk Score:   Fall Risk Score:  `1  Depression screen [facility] 2/9     08/01/2023   11:03 AM 06/30/2023    2:25 PM 05/12/2023    2:54 PM 02/13/2023    9:43 AM 02/04/2023   10:05 AM 01/07/2023    3:28 PM 12/24/2022    2:04 PM  Depression screen PHQ 2/9  Decreased Interest 0 1 2 3 2 2    Down, Depressed, Hopeless 0 1 3 3 2 2 1   PHQ - 2 Score 0 2 5 6 4 4 1   Altered sleeping   2 3  3 1   Tired, decreased energy   2 3  3    Change in appetite   2 1  3    Feeling bad or failure about yourself    2 0  3   Trouble concentrating   2 1  2    Moving slowly or fidgety/restless   0 0  2   Suicidal thoughts   0 0  0   PHQ-9 Score   15 14  20 2   Difficult doing work/chores   Very difficult Very difficult  Very difficult       Review of Systems  Musculoskeletal:  Positive for back pain and gait problem.  All other systems reviewed and are negative.     Objective:  Physical Exam  General No acute distress mood and affect without lability or agitation Motor strength is 5/5 bilateral hip flexor knee extensor ankle dorsiflexors Spine has tenderness palpation bilateral upper traps bilateral parascapular musculature as well.  No pain in the thoracic paraspinals there is pain in the upper lumbar paraspinals through the sacral area and into bilateral gluteal area including greater trochanters. She ambulates with a Loftstrand crutch no evidence of toe drag and instability Negative straight leg raising Sacroiliac provocative testing Faber's testing positive bilateral in the SI area Prone compression and positive bilateral SI  area Thigh thrust positive bilateral SI Distraction test negative Lateral compression test negative Sensation intact pinprick bilateral L3-4-5 S1 dermatomal distribution No evidence of intrinsic atrophy in the feet.      Assessment & Plan:  1 lumbar pain with lower extremity pain.  The lumbar pain appears to be multifactorial the patient does have 3/5 sacroiliac provocative tests positive.  Will schedule for sacroiliac injections under fluoroscopic guidance.  We discussed the potential doing one-sided a time with the other side as a control to compare. We discussed that the lower extremity paresthetic symptoms are likely related to her fibromyalgia as her lumbar MRI is negative. We discussed the importance of exercise and keeping a log to track duration and see if she has any Exact patient of pain following day.

## 2023-08-11 ENCOUNTER — Other Ambulatory Visit: Payer: Self-pay | Admitting: Family Medicine

## 2023-08-11 DIAGNOSIS — I471 Supraventricular tachycardia, unspecified: Secondary | ICD-10-CM

## 2023-08-14 DIAGNOSIS — R053 Chronic cough: Secondary | ICD-10-CM | POA: Diagnosis not present

## 2023-08-14 DIAGNOSIS — J385 Laryngeal spasm: Secondary | ICD-10-CM | POA: Diagnosis not present

## 2023-08-14 DIAGNOSIS — R1314 Dysphagia, pharyngoesophageal phase: Secondary | ICD-10-CM | POA: Diagnosis not present

## 2023-08-14 DIAGNOSIS — R0689 Other abnormalities of breathing: Secondary | ICD-10-CM | POA: Diagnosis not present

## 2023-08-14 DIAGNOSIS — R1310 Dysphagia, unspecified: Secondary | ICD-10-CM | POA: Diagnosis not present

## 2023-09-04 ENCOUNTER — Other Ambulatory Visit: Payer: Self-pay

## 2023-09-08 ENCOUNTER — Encounter (HOSPITAL_COMMUNITY): Payer: Self-pay

## 2023-09-08 ENCOUNTER — Other Ambulatory Visit (HOSPITAL_COMMUNITY): Payer: Self-pay

## 2023-09-10 ENCOUNTER — Other Ambulatory Visit (HOSPITAL_COMMUNITY): Payer: Self-pay

## 2023-09-10 DIAGNOSIS — R1310 Dysphagia, unspecified: Secondary | ICD-10-CM | POA: Diagnosis not present

## 2023-09-10 DIAGNOSIS — R0689 Other abnormalities of breathing: Secondary | ICD-10-CM | POA: Diagnosis not present

## 2023-09-12 ENCOUNTER — Other Ambulatory Visit: Payer: Self-pay

## 2023-09-12 ENCOUNTER — Encounter: Payer: Medicaid Other | Admitting: Physical Medicine & Rehabilitation

## 2023-09-12 NOTE — Progress Notes (Unsigned)
    GYNECOLOGY PROGRESS NOTE  Subjective:  PCP: Danelle Berry, PA-C  Patient ID: Dana Bradley, female    DOB: 08/21/1971, 52 y.o.   MRN: [id number]  HPI  Patient is a 53 y.o. [id number] female who presents for AUB last period 22 months ago.  {Common ambulatory SmartLinks:19316}  Review of Systems {ros; complete:30496}   Objective:   There were no vitals taken for this visit. There is no height or weight on file to calculate BMI.  General appearance: {general exam:16600} Abdomen: {abdominal exam:16834} Pelvic: {pelvic exam:16852::"cervix normal in appearance","external genitalia normal","no adnexal masses or tenderness","no cervical motion tenderness","rectovaginal septum normal","uterus normal size, shape, and consistency","vagina normal without discharge"} Extremities: {extremity exam:5109} Neurologic: {neuro exam:17854}   Assessment/Plan:   No diagnosis found.   There are no diagnoses linked to this encounter.     Julieanne Manson, DO [facility]

## 2023-09-16 ENCOUNTER — Encounter: Payer: Self-pay | Admitting: Obstetrics

## 2023-09-16 ENCOUNTER — Ambulatory Visit: Payer: Medicaid Other | Admitting: Obstetrics

## 2023-09-16 VITALS — BP 115/71 | HR 108 | Ht 63.0 in | Wt 227.0 lb

## 2023-09-16 DIAGNOSIS — R102 Pelvic and perineal pain: Secondary | ICD-10-CM

## 2023-09-16 DIAGNOSIS — N926 Irregular menstruation, unspecified: Secondary | ICD-10-CM

## 2023-09-16 MED ORDER — MEDROXYPROGESTERONE ACETATE 5 MG PO TABS: 5.0000 mg | ORAL_TABLET | Freq: Every day | ORAL | 3 refills | Status: DC

## 2023-09-19 DIAGNOSIS — E782 Mixed hyperlipidemia: Secondary | ICD-10-CM | POA: Diagnosis not present

## 2023-09-19 DIAGNOSIS — E119 Type 2 diabetes mellitus without complications: Secondary | ICD-10-CM | POA: Diagnosis not present

## 2023-09-19 DIAGNOSIS — I10 Essential (primary) hypertension: Secondary | ICD-10-CM | POA: Diagnosis not present

## 2023-09-20 LAB — COMPLETE METABOLIC PANEL WITH GFR
AG Ratio: 1.7 (calc) (ref 1.0–2.5)
ALT: 27 U/L (ref 6–29)
AST: 20 U/L (ref 10–35)
Albumin: 4.3 g/dL (ref 3.6–5.1)
Alkaline phosphatase (APISO): 116 U/L (ref 37–153)
BUN: 12 mg/dL (ref 7–25)
CO2: 25 mmol/L (ref 20–32)
Calcium: 9.8 mg/dL (ref 8.6–10.4)
Chloride: 102 mmol/L (ref 98–110)
Creat: 0.68 mg/dL (ref 0.50–1.03)
Globulin: 2.5 g/dL (ref 1.9–3.7)
Glucose, Bld: 119 mg/dL — ABNORMAL HIGH (ref 65–99)
Potassium: 4.1 mmol/L (ref 3.5–5.3)
Sodium: 140 mmol/L (ref 135–146)
Total Bilirubin: 0.3 mg/dL (ref 0.2–1.2)
Total Protein: 6.8 g/dL (ref 6.1–8.1)
eGFR: 105 mL/min/{1.73_m2} (ref >=60)

## 2023-09-20 LAB — HEMOGLOBIN A1C
Hgb A1c MFr Bld: 7 %{Hb} — ABNORMAL HIGH (ref <5.7)
Mean Plasma Glucose: 154 mg/dL
eAG (mmol/L): 8.5 mmol/L

## 2023-09-22 DIAGNOSIS — R0689 Other abnormalities of breathing: Secondary | ICD-10-CM | POA: Diagnosis not present

## 2023-09-22 DIAGNOSIS — R053 Chronic cough: Secondary | ICD-10-CM | POA: Diagnosis not present

## 2023-09-22 DIAGNOSIS — J385 Laryngeal spasm: Secondary | ICD-10-CM | POA: Diagnosis not present

## 2023-10-04 DIAGNOSIS — G4733 Obstructive sleep apnea (adult) (pediatric): Secondary | ICD-10-CM | POA: Diagnosis not present

## 2023-10-06 ENCOUNTER — Encounter
Payer: Medicaid Other | Attending: Physical Medicine and Rehabilitation | Admitting: Physical Medicine and Rehabilitation

## 2023-10-06 ENCOUNTER — Encounter: Payer: Self-pay | Admitting: Physical Medicine and Rehabilitation

## 2023-10-06 VITALS — BP 121/81 | HR 99 | Ht 63.0 in | Wt 228.8 lb

## 2023-10-06 DIAGNOSIS — Z79899 Other long term (current) drug therapy: Secondary | ICD-10-CM | POA: Diagnosis not present

## 2023-10-06 DIAGNOSIS — Z79891 Long term (current) use of opiate analgesic: Secondary | ICD-10-CM | POA: Diagnosis not present

## 2023-10-06 DIAGNOSIS — G894 Chronic pain syndrome: Secondary | ICD-10-CM | POA: Insufficient documentation

## 2023-10-06 DIAGNOSIS — G43711 Chronic migraine without aura, intractable, with status migrainosus: Secondary | ICD-10-CM | POA: Insufficient documentation

## 2023-10-06 DIAGNOSIS — M5441 Lumbago with sciatica, right side: Secondary | ICD-10-CM | POA: Diagnosis not present

## 2023-10-06 DIAGNOSIS — M549 Dorsalgia, unspecified: Secondary | ICD-10-CM | POA: Diagnosis not present

## 2023-10-06 DIAGNOSIS — M5481 Occipital neuralgia: Secondary | ICD-10-CM | POA: Diagnosis not present

## 2023-10-06 DIAGNOSIS — M19011 Primary osteoarthritis, right shoulder: Secondary | ICD-10-CM | POA: Diagnosis not present

## 2023-10-06 DIAGNOSIS — N926 Irregular menstruation, unspecified: Secondary | ICD-10-CM | POA: Insufficient documentation

## 2023-10-06 DIAGNOSIS — M5442 Lumbago with sciatica, left side: Secondary | ICD-10-CM | POA: Diagnosis not present

## 2023-10-06 DIAGNOSIS — M62838 Other muscle spasm: Secondary | ICD-10-CM | POA: Diagnosis not present

## 2023-10-06 DIAGNOSIS — G8929 Other chronic pain: Secondary | ICD-10-CM | POA: Insufficient documentation

## 2023-10-06 DIAGNOSIS — R270 Ataxia, unspecified: Secondary | ICD-10-CM | POA: Diagnosis not present

## 2023-10-06 DIAGNOSIS — M797 Fibromyalgia: Secondary | ICD-10-CM | POA: Insufficient documentation

## 2023-10-06 DIAGNOSIS — F439 Reaction to severe stress, unspecified: Secondary | ICD-10-CM | POA: Diagnosis not present

## 2023-10-06 DIAGNOSIS — M19012 Primary osteoarthritis, left shoulder: Secondary | ICD-10-CM | POA: Insufficient documentation

## 2023-10-06 MED ORDER — PREGABALIN 150 MG PO CAPS
150.0000 mg | ORAL_CAPSULE | Freq: Three times a day (TID) | ORAL | 3 refills | Status: DC | PRN
Start: 2023-10-06 — End: 2023-11-18

## 2023-10-06 MED ORDER — BUTALBITAL-APAP-CAFFEINE 50-325-40 MG PO TABS: 1.0000 | ORAL_TABLET | ORAL | 3 refills | Status: DC | PRN

## 2023-10-06 NOTE — Progress Notes (Signed)
 Subjective:    Patient ID: Dana Bradley, female    DOB: [birth year], 53 y.o.   MRN: [id number]  HPI   1) Chronic Migraine:  -has only had 3 non-migraines days since Thanksgiving -stopped progesterone in March, she feels that this was helping in combination with the different medications -[name] us  a 53 year old woman who presents for f/u of her chronic migraine  -discussed her food allergy test results -took the Nurtec and the Fioricet last night and her migraine got better -she thinks she been dehydrated -the Nolberto has helped -she thinks the Vyepti  has helped  -migraines have been decent over the last few weeks -she thinks Vyepti  helps -she is trying not to be stressed -[name] went to the cardiologist today and they are doing a cardiac MRI, and her fatigue is worse, she was told her heart is not pumping well. --she would like to try Zavzpret  -she has ordered Bi[employer]'s magnesium -she has been having stress due to her mother's Alzheimer's disease -she has been very fatigued during the day -she cannot be in light -on Monday she had to wear an eye mask in a darkened room. -Migraines will not get better unless she takes the Fioricet with the maxaalt but it puts her to sleepy -she feels she is getting a migraine every day -the Fioricet and Nurtec or Maxaalt together give her relief, but not complete relief.  -the days run into each other and its hard to remember the differences between them -she has been stressed in caring for her mother -she has noted recently that when she lays flat she feels fine but as soon as she sits up she feels an excruciating migraine -migraine continues to worsen from sitting to standing position -her daughter has similar symptoms but felt better after starting acetazolamide for presumed diagnosis of intracranial hypertension. She asks whether she may benefit from this medication as well.  Terrible stomach spasms and diarrhea. 12 hours later  debilitating migraine. It usually starts in the afternoon. She feels that this may be part of her prodrome. She also has terrible vertigo that is getting worse. The Fiorcet helps a lot, the Migronal helps with the frontal headache. These make the pain bearable.  -having migraines close to every 24H -she is not functional -she is not sure if Maxaalt is helping. She is not taking with the Nurtec. It is helping some but she is not sure how much. The fioricet alone helps by putting her to sleep.  -she has not heart any news about the Vypeti infusion. The medication was approved  -has been having more dizziness.  -the pain has been worst since she had the occipital nerve blocks.  -she knows it is stress related -she feels a pain radiating down her arm as well. It also runs into her head and into her face.  -Sometimes her whole left side is fizzy.  -she has tried Amitriptyline .  -have been awful since December. Had a migraine from Dec 1st throughout 9th.  -the day before she was incapacitated.  -Vyepti  has been approved -The nurtec and the Fiorocet help together to break the migraine.  -she needs a refill of her Fioricet and nausea, she finds this helpful.  -Nurtec- she is still working with the company to get this improved -she asks about prodrome and postdrome (she experiences cramping and diarrhea).  -on Sunday she closed out a 56 hour migraines. -she has been trying to follow ketogenic diet.  -Nurtec helping- 5 days  in a row without a migraines.  -she feels if she could take it as a rescue medication in addition to every other day this may help.   2) Vertigo -Symptoms are worsening.  -She feels that this may be leading up to the vertigo.  -She is interested in vestibular therapy.  -she had a heating pad on the shoulder and arm.   3) Cervical myofasical pain syndrome: -Muscles of neck are very tight, pain is worse on the left side.  -She did not find spinal injections helpful, they made  the pain worse.   4) Insomnia: she sleeps in chunks -she is willing to increase amitriptyline  to 50mg .   5) Cognitive impairments: -she loves to cook and noticed she has been making more mistakes -she thinks the headaches are a problem.  6) Pain radiating from back into legs -feels twitches -worse at night -legs kick out at night.   7) IBS -terrible in the night -2am on Sunday. -horrible pain -often with diarrhea and improves symptoms after she has diarrhea.  -cramping.  -last normal BM is 1/9 -having abdominal distension -magnesium citrate does not help -miralax multiple times per day do not help  8) Menstrual bleeding: -has been present since 12/11 -she was started on progesterone a few days before that date  9) depression: -she has dry mouth will wellbutrin  and did not find much efficacy. She asked if there is an alternative medication she can try -she had tried Zoloft  in the past with benefits -she is going to pick up the Zoloft  today and try it tonight -she would be willing to take vitamin D   10) Low back pain: -radiates into her feet -not a good surgical candidate -she feels she will be wheelchair bound soon  11. Stress: -she is stressed that [name] as a pleural lesion  Mrs. Sturdivant is a 53 year old woman who presents for follow-up of her chronic migraine. Her average pain is 6/10 and pain right now is 6/10. Her pain is intermittent, sharp, burning, stabbing, tingling, and aching.   She recently had 7 days without migraine. She is not sure what was associated with this. She had a pretty terrible bounceback.  She picked up the Migranal  nasal spray. Our CMA explained how it works.   Her pain doctor is going to stop prescribing the Lyrica  and she needs to find a new doctor to prescribe this.    Pain Inventory Average Pain 6 Pain Right Now 6 My pain is intermittent, sharp, burning, stabbing, tingling, and aching  In the last 24 hours, has pain interfered with  the following? General activity 9 Relation with others 10 Enjoyment of life 10 What TIME of day is your pain at its worst? morning , daytime, evening, and night Sleep (in general) Poor  Pain is worse with: bending and some activites Pain improves with: medication, TENS, and HEAT Relief from Meds: 5    Family History  Problem Relation Age of Onset   Depression Mother    Hypertension Mother    Cancer Mother        Skin   Hyperlipidemia Mother    Anxiety disorder Mother    Migraines Mother    Alcohol abuse Father    Depression Father    Stroke Father    Heart disease Father    Hypertension Father    Anxiety disorder Father    Depression Sister    Hyperlipidemia Sister    Diabetes Sister    Hypertension Sister  Polycystic ovary syndrome Sister    Bipolar disorder Sister    Anxiety disorder Sister    Migraines Sister    Depression Sister    Hypertension Sister    Anxiety disorder Sister    Migraines Sister    Breast cancer Maternal Grandmother 36   Cancer Maternal Grandmother 72       Breast   Thyroid  disease Maternal Grandmother    Arthritis Maternal Grandmother    Hyperlipidemia Maternal Grandmother    Aneurysm Maternal Grandfather    Hypertension Maternal Grandfather    Heart disease Maternal Grandfather    Alzheimer's disease Paternal Grandmother    Heart attack Paternal Grandfather    Hypertension Paternal Grandfather    COPD Paternal Grandfather    Heart disease Paternal Grandfather    Migraines Daughter    Other Daughter        leomyoa scaroma   Migraines Daughter    Migraines Son    Alzheimer's disease Other    Bladder Cancer Neg Hx    Kidney cancer Neg Hx    Social History   Socioeconomic History   Marital status: Married    Spouse name: brian   Number of children: 3   Years of education: Not on file   Highest education level: Associate degree: academic program  Occupational History   Occupation: disbled    Comment: not able  Tobacco Use    Smoking status: Former    Current packs/day: 0.00    Average packs/day: 2.0 packs/day for 3.0 years (6.0 ttl pk-yrs)    Types: Cigarettes    Start date: 12/10/1989    Quit date: 12/10/1992    Years since quitting: 30.8   Smokeless tobacco: Never   Tobacco comments:    quit 25 years ago  Vaping Use   Vaping status: Never Used  Substance and Sexual Activity   Alcohol use: No    Comment: socially   Drug use: No   Sexual activity: Not Currently    Partners: Male    Birth control/protection: Surgical  Other Topics Concern   Not on file  Social History Narrative   Lives at home with her husband and 2 of her children   Right handed   Caffeine : 0-2 cups daily   Social Drivers of Health   Financial Resource Strain: High Risk (08/29/2017)   Overall Financial Resource Strain (CARDIA)    Difficulty of Paying Living Expenses: Very hard  Food Insecurity: Food Insecurity Present (08/29/2017)   Hunger Vital Sign    Worried About Running Out of Food in the Last Year: Often true    Ran Out of Food in the Last Year: Often true  Transportation Needs: Unmet Transportation Needs (08/29/2017)   PRAPARE - Administrator, Civil Service (Medical): Yes    Lack of Transportation (Non-Medical): Yes  Physical Activity: Inactive (08/29/2017)   Exercise Vital Sign    Days of Exercise per Week: 0 days    Minutes of Exercise per Session: 0 min  Stress: Stress Concern Present (08/29/2017)   [employer] Occupational [employer] - Occupational Stress Questionnaire    Feeling of Stress : Rather much  Social Connections: Unknown (10/16/2022)   [facility]   Social Network    Social Network: Not on file   Past Surgical History:  Procedure Laterality Date   ABLATION     Uterine   BREAST BIOPSY Left    2014 U/S bx fibroadenoma   BREAST BIOPSY  Left 05/28/2023   lr us  bx 3:00 heart clip   BREAST BIOPSY Left 05/28/2023   US  LT BREAST BX W LOC DEV 1ST LESION  IMG BX SPEC US  GUIDE 05/28/2023 [facility]   CARDIAC CATHETERIZATION     with ablation   COLONOSCOPY N/A 09/26/2022   Procedure: COLONOSCOPY;  Surgeon: Therisa Bi, MD;  Location: [facility] ENDOSCOPY;  Service: Gastroenterology;  Laterality: N/A;   COLONOSCOPY WITH PROPOFOL  N/A 05/17/2015   Procedure: COLONOSCOPY WITH PROPOFOL ;  Surgeon: Lamar ONEIDA Holmes, MD;  Location: [facility] ENDOSCOPY;  Service: Endoscopy;  Laterality: N/A;   COLONOSCOPY WITH PROPOFOL  N/A 03/20/2020   Procedure: COLONOSCOPY WITH PROPOFOL ;  Surgeon: Therisa Bi, MD;  Location: [facility] ENDOSCOPY;  Service: Gastroenterology;  Laterality: N/A;   COLONOSCOPY WITH PROPOFOL  N/A 09/25/2022   Procedure: COLONOSCOPY WITH PROPOFOL ;  Surgeon: Therisa Bi, MD;  Location: [facility] ENDOSCOPY;  Service: Gastroenterology;  Laterality: N/A;   COLONOSCOPY WITH PROPOFOL  N/A 10/02/2022   Procedure: COLONOSCOPY WITH PROPOFOL ;  Surgeon: Therisa Bi, MD;  Location: [facility] ENDOSCOPY;  Service: Gastroenterology;  Laterality: N/A;   COLONOSCOPY WITH PROPOFOL  N/A 11/13/2022   Procedure: COLONOSCOPY WITH PROPOFOL ;  Surgeon: Therisa Bi, MD;  Location: [facility] ENDOSCOPY;  Service: Gastroenterology;  Laterality: N/A;   ESOPHAGOGASTRODUODENOSCOPY N/A 05/17/2015   Procedure: ESOPHAGOGASTRODUODENOSCOPY (EGD);  Surgeon: Lamar ONEIDA Holmes, MD;  Location: [facility] ENDOSCOPY;  Service: Endoscopy;  Laterality: N/A;   ESOPHAGOGASTRODUODENOSCOPY N/A 09/25/2022   Procedure: ESOPHAGOGASTRODUODENOSCOPY (EGD);  Surgeon: Therisa Bi, MD;  Location: [facility] ENDOSCOPY;  Service: Gastroenterology;  Laterality: N/A;   ESOPHAGOGASTRODUODENOSCOPY N/A 09/26/2022   Procedure: ESOPHAGOGASTRODUODENOSCOPY (EGD);  Surgeon: Therisa Bi, MD;  Location: [facility] ENDOSCOPY;  Service: Gastroenterology;  Laterality: N/A;   KNEE ARTHROSCOPY     spg     6/18   Tibial Tubercle Bypass Right 1998   TUBAL LIGATION  10/01/1999   Past Surgical History:  Procedure Laterality Date   ABLATION     Uterine   BREAST  BIOPSY Left    2014 U/S bx fibroadenoma   BREAST BIOPSY Left 05/28/2023   lr us  bx 3:00 heart clip   BREAST BIOPSY Left 05/28/2023   US  LT BREAST BX W LOC DEV 1ST LESION IMG BX SPEC US  GUIDE 05/28/2023 [facility]   CARDIAC CATHETERIZATION     with ablation   COLONOSCOPY N/A 09/26/2022   Procedure: COLONOSCOPY;  Surgeon: Therisa Bi, MD;  Location: [facility] ENDOSCOPY;  Service: Gastroenterology;  Laterality: N/A;   COLONOSCOPY WITH PROPOFOL  N/A 05/17/2015   Procedure: COLONOSCOPY WITH PROPOFOL ;  Surgeon: Lamar ONEIDA Holmes, MD;  Location: [facility] ENDOSCOPY;  Service: Endoscopy;  Laterality: N/A;   COLONOSCOPY WITH PROPOFOL  N/A 03/20/2020   Procedure: COLONOSCOPY WITH PROPOFOL ;  Surgeon: Therisa Bi, MD;  Location: [facility] ENDOSCOPY;  Service: Gastroenterology;  Laterality: N/A;   COLONOSCOPY WITH PROPOFOL  N/A 09/25/2022   Procedure: COLONOSCOPY WITH PROPOFOL ;  Surgeon: Therisa Bi, MD;  Location: [facility] ENDOSCOPY;  Service: Gastroenterology;  Laterality: N/A;   COLONOSCOPY WITH PROPOFOL  N/A 10/02/2022   Procedure: COLONOSCOPY WITH PROPOFOL ;  Surgeon: Therisa Bi, MD;  Location: [facility] ENDOSCOPY;  Service: Gastroenterology;  Laterality: N/A;   COLONOSCOPY WITH PROPOFOL  N/A 11/13/2022   Procedure: COLONOSCOPY WITH PROPOFOL ;  Surgeon: Therisa Bi, MD;  Location: [facility] ENDOSCOPY;  Service: Gastroenterology;  Laterality: N/A;   ESOPHAGOGASTRODUODENOSCOPY N/A 05/17/2015   Procedure: ESOPHAGOGASTRODUODENOSCOPY (EGD);  Surgeon: Lamar ONEIDA Holmes, MD;  Location: [facility] ENDOSCOPY;  Service: Endoscopy;  Laterality: N/A;   ESOPHAGOGASTRODUODENOSCOPY N/A 09/25/2022   Procedure: ESOPHAGOGASTRODUODENOSCOPY (EGD);  Surgeon: Therisa Bi, MD;  Location: [facility]  ENDOSCOPY;  Service: Gastroenterology;  Laterality: N/A;   ESOPHAGOGASTRODUODENOSCOPY N/A 09/26/2022   Procedure: ESOPHAGOGASTRODUODENOSCOPY (EGD);  Surgeon: Therisa Bi, MD;  Location: [facility] ENDOSCOPY;  Service: Gastroenterology;  Laterality: N/A;   KNEE ARTHROSCOPY      spg     6/18   Tibial Tubercle Bypass Right 1998   TUBAL LIGATION  10/01/1999   Past Medical History:  Diagnosis Date   Acute postoperative pain 04/07/2017   Anxiety    Bursitis    Chronic fatigue 12/12/2017   Chronic fatigue syndrome    Colitis 2021   Diabetes mellitus without complication (HCC)    Edema leg 05/02/2015   Fibromyalgia    GERD (gastroesophageal reflux disease)    IBS (irritable bowel syndrome)    Knee pain, bilateral 12/21/2008   Qualifier: Diagnosis of  By: Gladis FNP, Nykedtra     Lumbar discitis    Migraines    Osteoarthritis    Right hand pain 04/10/2015   [facility] has done nerve conduction studies and ruled out carpal tunnel.    Sleep apnea    Spinal stenosis    SVT (supraventricular tachycardia) (HCC)    Vertigo    Vitamin D  deficiency 05/01/2016   BP 121/81   Pulse 99   Ht 5' 3 (1.6 m)   Wt 228 lb 12.8 oz (103.8 kg)   SpO2 99%   BMI 40.53 kg/m   Opioid Risk Score:   Fall Risk Score:  `1  Depression screen [facility] 2/9     10/06/2023   11:59 AM 08/01/2023   11:03 AM 06/30/2023    2:25 PM 05/12/2023    2:54 PM 02/13/2023    9:43 AM 02/04/2023   10:05 AM 01/07/2023    3:28 PM  Depression screen PHQ 2/9  Decreased Interest 1 0 1 2 3 2 2   Down, Depressed, Hopeless 1 0 1 3 3 2 2   PHQ - 2 Score 2 0 2 5 6 4 4   Altered sleeping    2 3  3   Tired, decreased energy    2 3  3   Change in appetite    2 1  3   Feeling bad or failure about yourself     2 0  3  Trouble concentrating    2 1  2   Moving slowly or fidgety/restless    0 0  2  Suicidal thoughts    0 0  0  PHQ-9 Score    15 14  20   Difficult doing work/chores    Very difficult Very difficult  Very difficult   Review of Systems  Constitutional: Negative.   HENT: Negative.    Eyes: Negative.   Respiratory: Negative.    Cardiovascular: Negative.   Gastrointestinal: Negative.   Endocrine: Negative.   Genitourinary: Negative.   Musculoskeletal:  Positive for back pain.       Left side  of body from head to toes  Skin: Negative.   Allergic/Immunologic: Negative.   Neurological:  Positive for dizziness and headaches.  Hematological: Negative.   Psychiatric/Behavioral: Negative.    All other systems reviewed and are negative.      Objective:   Physical Exam Gen: no distress, normal appearing HEENT: oral mucosa pink and moist, NCAT Cardio: Reg rate Chest: normal effort, normal rate of breathing Abd: soft, non-distended Ext: no edema Psych: pleasant, normal affect Skin: intact Neuro: Alert and oriented without focal deficits     Assessment & Plan:  Mrs. Bluestein is a 53 year  old woman who presents for f/u with chronic intractable migraines s/p numerous treatments, severe fibromyalgia, and IBS, and nausea, and vertigo.     1) Vertigo: In the future, restart for cervical myofascial pain syndrome: myofascial release, postural correction, stretching and strengthening of the muscles of the neck and upper back, development of HEP. Conitnue heating pads to muscles of upper back and neck. She is doing HEP.  -referred for vestibular therapy.  -discussed current symptoms.  -discussed response to meclizine  -discussed worsening symptoms when in car   2) Migraines:  -discussed food allergy results -refilled Lyrica  and Fioricet -discussed that restarting progesterone may be helpful -continue Alycia -discussed that highest priority is to eliminate gluten Continue Fioricet, which is one of the medications that helps her. Refilled today. She takes this at least once every time she gets a migraine. Advised to use upon migraine onset and to not use more frequently than q6H during migraine. Refilled Zofran  for nausea last week, and this has been helping her. Continue metoprolol  which can be helpful in migraine prophylaxis (HR is well controlled). Prescribed ergot nasal spray to try upon migraine initiation- advised no more frequently than 4 sprays per hour (still awaiting on prior  auth). Discussed avoiding foods that may trigger migraines.  Failed vypeti, ajovy, emgality , continue Vypeti trial, increase Vyepti  to 300mg  -trial of [employer]'s magnesium supplement -discussed the elimination diet -prescribed Zavzpret  -continue Qulipta  daily prn.  -continue re-entry phase of elimination diet- she has noted chili pepper and eggs to be triggers for her -failed feverfew supplement -discussed lumbar puncture Does get some benefit from Maxalt  and Fioricet in combination -takes metoprolol  -ordered 95 item allergy test -discussed increasing Fioricet or Amitriptyline .  -recommended drinking a glass of water every morning before standing up as her symptoms sound like orthostatic hypotension which could worsen migraines -recommended checking blood pressure daily in supine, sitting, and standing positions and this should help us  identify if symptoms are from orthostatic hypotension or intracranial hypertension -discussed trying daily water first before trying medication acetazolamide.  -continue vitamin D   -Discussed that I can provide refill for her Lyrica  when she needs it.  -topamax  was not effective.  -When she gets the migraines her loss of words is getting worse.  -Ordered Vyepti - she will find out cost from her pharmacy. She will bring paperwork for this next visit. She is excited to try it.  -d/c nurtec since switched to maxalt  -continue ear piercing. Pain feels more like pressure and less acidic.  -no benefits from Botox.  -continue to track migraines.  -Continue Baclofen  for pain relief . Minimize use of Hydrocodone . She is only taking Lyrica  once per day to make it last. Continue Migranal  which is helping. Will occipital nerve block -Discussed that Mrs. Mckinlay's greatest source of happiness is her family. This community will be essential in helping her recover from her chronic pain and to increase her daily activity. Her daughter is also suffering from similar migraines  unfortunately.  -Continue ginger, herbs, turmeric, blueberries, eating real food. Continue cutting down sugar. Using local honey is a great alternative. -Provided with a pain relief  journal and discussed that it contains foods and lifestyle tips to naturally help to improve pain. Discussed that these lifestyle strategies are also very good for health unlike some medications which can have negative side effects. Discussed that the act of keeping a journal can be therapeutic and helpful to realize patterns what helps to trigger and alleviate pain.   -Continue Nurtec every other day- may  use for breakthrough migraines as well.  -Discussed plan for Vypeti at [facility].  Discussed that exercise is one of the most effective treatments for fibromyalgia. This will also help with her obesity. Made goal with Mrs. Stennett to walk outside her home at least once per day, and to garden at least once per day (her favorite activity). Can use elliptical which she has at home on rainy days. The heat has been oppressive and so she has been trying to do the latter more.  Foods to alleviate migraine:  1) dark leafy greens 2) avocado 3) tuna 4) samon and mackerel 5) beans and legumes Supplements that can be helpful: feverfew, B12, and magnesium  Foods to avoid in migraine: 1) Excessive (or irregular timing) coffee 2) red wine 3) aged cheeses 4) chocolate 5) citrus fruits 6) aspartame and other artifical sweeteners 7) yeast 8) MSG (in processed foods) 9) processed and cured meats 10) nuts and certain seeds 11) chicken livers and other organ meats 12) dairy products like buttermilk, sour cream, and yogurt 13) dried fruits like dates, figs, and raisins 14) garlic 15) onions 16) potato chips 17) pickled foods like olives and sauerkraut 18) some fresh fruits like ripe banana, papaya, red plums, raspberries, kiwi, pineapple 19) tomato-based products  Recommend to keep a migraine diary: rate  daily the severity of your headache (1-10) and what foods you eat that day to help determine patterns.    3) Cervical facet arthrosis: Cervical XR normal- discussed results with patient. Pain is worse on left side.   4) Anxiety and depression -Her daughter was recently diagnosed with sarcoma and this is a great source of stress and fear to Mrs. Emilio. It has been a turbulent time for her and this has understandably worsened her symptoms. Discussed benefits of gratitude journaling and she plans to try this.  -discussed her life stressor, her mom's illness -discussed her difficult wean off Pristiq  -prescribed zoloft  -stop Wellbutrin  -prescribed vitamin D  -recommended Bi[employer]'s magnesium breakthrough -recommended methylated multivitamin -discussed following with Dr. Corina.  -increase amitriptyline  to 150mg  HS  5) Insomnia:  -continue amitirptyline to 50mg .  -Try to go outside near sunrise -Get exercise during the day.  -Discussed good sleep hygiene: turning off all devices an hour before bedtime.  -Chamomile tea with dinner.  -Can consider over the counter melatonin -discussed magnesium  6) Nausea:  -continue zofran , discussed side effect of constipation -start B6 -discussed scopolamine  patch, discussed to place behind ear and lave on 72 hours.    7) Cervical myofascial pain syndrome: Will try some trigger point injections with occipital nerve block next visit.  -increase amitriptyline  to 100mg  HS  8) Concern for small bowel obstruction: -stat abdominal MRI ordered given no BM since 1/9.  -recommended to go to [facility] to get this done stat -discussed that it would be beneficial to go to the ED.   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  Carilyn for [facility] -discussed that she has been told she is no longer a candidate for injections, or surgery of spinal cord stimulator -Discussed Qutenza as an option for neuropathic pain control. Discussed that this is a capsaicin patch, stronger than capsaicin cream. Discussed that it is currently approved for diabetic peripheral neuropathy and post-herpetic neuralgia, but that it has  also shown benefit in treating other forms of neuropathy. Provided patient with link to site to learn more about the patch: https://www.clark.biz/. Discussed that the patch would be placed in office and benefits usually last 3 months. Discussed that unintended exposure to capsaicin can cause severe irritation of eyes, mucous membranes, respiratory tract, and skin, but that Qutenza is a local treatment and does not have the systemic side effects of other nerve medications. Discussed that there may be pain, itching, erythema, and decreased sensory function associated with the application of Qutenza. Side effects usually subside within 1 week. A cold pack of analgesic medications can help with these side effects. Blood pressure can also be increased due to pain associated with administration of the patch.  unchanged. Shallow broad-based disc protrusion L4-5 unchanged. -continue Norco -continue turmeric Turmeric to reduce inflammation--can be used in cooking or taken as a supplement.  Benefits of turmeric:  -Highly anti-inflammatory  -Increases antioxidants  -Improves memory, attention, brain disease  -Lowers risk of heart disease  -May help prevent cancer  -Decreases pain  -Alleviates depression  -Delays aging and decreases risk of chronic disease  -Consume with black pepper to increase absorption    Turmeric Milk Recipe:  1 cup milk  1 tsp turmeric  1 tsp cinnamon  1 tsp grated ginger (optional)  Black pepper (boosts the anti-inflammatory properties of turmeric).  1 tsp honey   12)  Stress: -discussed that her daughter is not sleeping well -discussed her mom's health condition and the stress this causes her  13) Menstrual bleeding: -discussed that she should follow-up with OB/GYN to see if progesterone can be restarted  14. Ataxia: -discussed problem with her motor control when she is walking -handicap placard prescribed as she is unable to ambulate 200 feet without stopping rest -continue elliptical

## 2023-10-09 ENCOUNTER — Encounter: Payer: Medicaid Other | Admitting: Physical Medicine & Rehabilitation

## 2023-10-09 ENCOUNTER — Other Ambulatory Visit: Payer: Self-pay

## 2023-10-09 ENCOUNTER — Encounter: Payer: Self-pay | Admitting: Physical Medicine & Rehabilitation

## 2023-10-09 VITALS — BP 125/84 | HR 107 | Ht 63.0 in | Wt 228.0 lb

## 2023-10-09 DIAGNOSIS — M5442 Lumbago with sciatica, left side: Secondary | ICD-10-CM | POA: Diagnosis not present

## 2023-10-09 DIAGNOSIS — G8929 Other chronic pain: Secondary | ICD-10-CM | POA: Diagnosis not present

## 2023-10-09 DIAGNOSIS — M797 Fibromyalgia: Secondary | ICD-10-CM | POA: Diagnosis not present

## 2023-10-09 DIAGNOSIS — G894 Chronic pain syndrome: Secondary | ICD-10-CM

## 2023-10-09 DIAGNOSIS — M62838 Other muscle spasm: Secondary | ICD-10-CM

## 2023-10-09 DIAGNOSIS — M549 Dorsalgia, unspecified: Secondary | ICD-10-CM

## 2023-10-09 DIAGNOSIS — M5481 Occipital neuralgia: Secondary | ICD-10-CM

## 2023-10-09 DIAGNOSIS — M19011 Primary osteoarthritis, right shoulder: Secondary | ICD-10-CM

## 2023-10-09 DIAGNOSIS — M19012 Primary osteoarthritis, left shoulder: Secondary | ICD-10-CM | POA: Diagnosis not present

## 2023-10-09 DIAGNOSIS — F439 Reaction to severe stress, unspecified: Secondary | ICD-10-CM | POA: Diagnosis not present

## 2023-10-09 DIAGNOSIS — M5441 Lumbago with sciatica, right side: Secondary | ICD-10-CM | POA: Diagnosis not present

## 2023-10-09 DIAGNOSIS — R270 Ataxia, unspecified: Secondary | ICD-10-CM | POA: Diagnosis not present

## 2023-10-09 DIAGNOSIS — N926 Irregular menstruation, unspecified: Secondary | ICD-10-CM | POA: Diagnosis not present

## 2023-10-09 DIAGNOSIS — G43711 Chronic migraine without aura, intractable, with status migrainosus: Secondary | ICD-10-CM | POA: Diagnosis not present

## 2023-10-09 DIAGNOSIS — Z79891 Long term (current) use of opiate analgesic: Secondary | ICD-10-CM | POA: Diagnosis not present

## 2023-10-09 DIAGNOSIS — Z79899 Other long term (current) drug therapy: Secondary | ICD-10-CM

## 2023-10-09 MED ORDER — HYDROCODONE-ACETAMINOPHEN 7.5-325 MG PO TABS
1.0000 | ORAL_TABLET | Freq: Two times a day (BID) | ORAL | 0 refills | Status: DC | PRN
Start: 2023-10-09 — End: 2024-01-05

## 2023-10-09 NOTE — Progress Notes (Signed)
 Subjective:    Patient ID: Dana Bradley, female    DOB: 1971/01/18, 53 y.o.   MRN: [id number]  HPI 53 year old female with history of fibromyalgia syndrome, cervical and lumbar spondylosis as well as degenerative disc, sacroiliac disorder, knee osteoarthritis and morbid obesity who is here today to discuss injection therapies as well as medication management options.  She had been getting symptoms lumbar spine injections with Dr. Naveira however she no longer sees him.  In addition he was prescribing hydrocodone  7.5 mg which she takes on a very occasional basis.  Her last prescription was filled on 02/26/2023 and she ran out about 1 or 2 weeks ago.  The prescription was for 25 tablets. When she was receiving lumbar injections, she required IV sedation.  She also sometimes had vasovagal episodes requiring IV fluid. Pain Inventory Average Pain 7 Pain Right Now 6 My pain is constant, sharp, burning, stabbing, and tingling  In the last 24 hours, has pain interfered with the following? General activity 5 Relation with others 10 Enjoyment of life 9 What TIME of day is your pain at its worst? morning  and evening Sleep (in general) Poor  Pain is worse with: walking, bending, sitting, inactivity, standing, and some activites Pain improves with: rest, heat/ice, medication, and TENS Relief from Meds: 5  Family History  Problem Relation Age of Onset   Depression Mother    Hypertension Mother    Cancer Mother        Skin   Hyperlipidemia Mother    Anxiety disorder Mother    Migraines Mother    Alcohol abuse Father    Depression Father    Stroke Father    Heart disease Father    Hypertension Father    Anxiety disorder Father    Depression Sister    Hyperlipidemia Sister    Diabetes Sister    Hypertension Sister    Polycystic ovary syndrome Sister    Bipolar disorder Sister    Anxiety disorder Sister    Migraines Sister    Depression Sister    Hypertension Sister    Anxiety disorder  Sister    Migraines Sister    Breast cancer Maternal Grandmother 60   Cancer Maternal Grandmother 41       Breast   Thyroid  disease Maternal Grandmother    Arthritis Maternal Grandmother    Hyperlipidemia Maternal Grandmother    Aneurysm Maternal Grandfather    Hypertension Maternal Grandfather    Heart disease Maternal Grandfather    Alzheimer's disease Paternal Grandmother    Heart attack Paternal Grandfather    Hypertension Paternal Grandfather    COPD Paternal Grandfather    Heart disease Paternal Grandfather    Migraines Daughter    Other Daughter        leomyoa scaroma   Migraines Daughter    Migraines Son    Alzheimer's disease Other    Bladder Cancer Neg Hx    Kidney cancer Neg Hx    Social History   Socioeconomic History   Marital status: Married    Spouse name: brian   Number of children: 3   Years of education: Not on file   Highest education level: Associate degree: academic program  Occupational History   Occupation: disbled    Comment: not able  Tobacco Use   Smoking status: Former    Current packs/day: 0.00    Average packs/day: 2.0 packs/day for 3.0 years (6.0 ttl pk-yrs)    Types: Cigarettes    Start  date: 12/10/1989    Quit date: 12/10/1992    Years since quitting: 30.8   Smokeless tobacco: Never   Tobacco comments:    quit 25 years ago  Vaping Use   Vaping status: Never Used  Substance and Sexual Activity   Alcohol use: No    Comment: socially   Drug use: No   Sexual activity: Not Currently    Partners: Male    Birth control/protection: Surgical  Other Topics Concern   Not on file  Social History Narrative   Lives at home with her husband and 2 of her children   Right handed   Caffeine : 0-2 cups daily   Social Drivers of Health   Financial Resource Strain: High Risk (08/29/2017)   Overall Financial Resource Strain (CARDIA)    Difficulty of Paying Living Expenses: Very hard  Food Insecurity: Food Insecurity Present (08/29/2017)    Hunger Vital Sign    Worried About Running Out of Food in the Last Year: Often true    Ran Out of Food in the Last Year: Often true  Transportation Needs: Unmet Transportation Needs (08/29/2017)   PRAPARE - Administrator, Civil Service (Medical): Yes    Lack of Transportation (Non-Medical): Yes  Physical Activity: Inactive (08/29/2017)   Exercise Vital Sign    Days of Exercise per Week: 0 days    Minutes of Exercise per Session: 0 min  Stress: Stress Concern Present (08/29/2017)   [employer]ational [employer] - Occupational Stress Questionnaire    Feeling of Stress : Rather much  Social Connections: Unknown (10/16/2022)   [facility]   Social Network    Social Network: Not on file   Past Surgical History:  Procedure Laterality Date   ABLATION     Uterine   BREAST BIOPSY Left    2014 U/S bx fibroadenoma   BREAST BIOPSY Left 05/28/2023   lr us  bx 3:00 heart clip   BREAST BIOPSY Left 05/28/2023   US  LT BREAST BX W LOC DEV 1ST LESION IMG BX SPEC US  GUIDE 05/28/2023 [facility]   CARDIAC CATHETERIZATION     with ablation   COLONOSCOPY N/A 09/26/2022   Procedure: COLONOSCOPY;  Surgeon: Therisa Bi, MD;  Location: [facility] ENDOSCOPY;  Service: Gastroenterology;  Laterality: N/A;   COLONOSCOPY WITH PROPOFOL  N/A 05/17/2015   Procedure: COLONOSCOPY WITH PROPOFOL ;  Surgeon: Lamar ONEIDA Holmes, MD;  Location: [facility] ENDOSCOPY;  Service: Endoscopy;  Laterality: N/A;   COLONOSCOPY WITH PROPOFOL  N/A 03/20/2020   Procedure: COLONOSCOPY WITH PROPOFOL ;  Surgeon: Therisa Bi, MD;  Location: [facility] ENDOSCOPY;  Service: Gastroenterology;  Laterality: N/A;   COLONOSCOPY WITH PROPOFOL  N/A 09/25/2022   Procedure: COLONOSCOPY WITH PROPOFOL ;  Surgeon: Therisa Bi, MD;  Location: [facility] ENDOSCOPY;  Service: Gastroenterology;  Laterality: N/A;   COLONOSCOPY WITH PROPOFOL  N/A 10/02/2022   Procedure: COLONOSCOPY WITH PROPOFOL ;  Surgeon: Therisa Bi, MD;   Location: [facility] ENDOSCOPY;  Service: Gastroenterology;  Laterality: N/A;   COLONOSCOPY WITH PROPOFOL  N/A 11/13/2022   Procedure: COLONOSCOPY WITH PROPOFOL ;  Surgeon: Therisa Bi, MD;  Location: [facility] ENDOSCOPY;  Service: Gastroenterology;  Laterality: N/A;   ESOPHAGOGASTRODUODENOSCOPY N/A 05/17/2015   Procedure: ESOPHAGOGASTRODUODENOSCOPY (EGD);  Surgeon: Lamar ONEIDA Holmes, MD;  Location: [facility] ENDOSCOPY;  Service: Endoscopy;  Laterality: N/A;   ESOPHAGOGASTRODUODENOSCOPY N/A 09/25/2022   Procedure: ESOPHAGOGASTRODUODENOSCOPY (EGD);  Surgeon: Therisa Bi, MD;  Location: [facility] ENDOSCOPY;  Service: Gastroenterology;  Laterality: N/A;   ESOPHAGOGASTRODUODENOSCOPY N/A 09/26/2022   Procedure: ESOPHAGOGASTRODUODENOSCOPY (EGD);  Surgeon:  Therisa Bi, MD;  Location: [facility] ENDOSCOPY;  Service: Gastroenterology;  Laterality: N/A;   KNEE ARTHROSCOPY     spg     6/18   Tibial Tubercle Bypass Right 1998   TUBAL LIGATION  10/01/1999   Past Surgical History:  Procedure Laterality Date   ABLATION     Uterine   BREAST BIOPSY Left    2014 U/S bx fibroadenoma   BREAST BIOPSY Left 05/28/2023   lr us  bx 3:00 heart clip   BREAST BIOPSY Left 05/28/2023   US  LT BREAST BX W LOC DEV 1ST LESION IMG BX SPEC US  GUIDE 05/28/2023 [facility]   CARDIAC CATHETERIZATION     with ablation   COLONOSCOPY N/A 09/26/2022   Procedure: COLONOSCOPY;  Surgeon: Therisa Bi, MD;  Location: [facility] ENDOSCOPY;  Service: Gastroenterology;  Laterality: N/A;   COLONOSCOPY WITH PROPOFOL  N/A 05/17/2015   Procedure: COLONOSCOPY WITH PROPOFOL ;  Surgeon: Lamar ONEIDA Holmes, MD;  Location: [facility] ENDOSCOPY;  Service: Endoscopy;  Laterality: N/A;   COLONOSCOPY WITH PROPOFOL  N/A 03/20/2020   Procedure: COLONOSCOPY WITH PROPOFOL ;  Surgeon: Therisa Bi, MD;  Location: [facility] ENDOSCOPY;  Service: Gastroenterology;  Laterality: N/A;   COLONOSCOPY WITH PROPOFOL  N/A 09/25/2022   Procedure: COLONOSCOPY WITH PROPOFOL ;  Surgeon: Therisa Bi, MD;  Location:  [facility] ENDOSCOPY;  Service: Gastroenterology;  Laterality: N/A;   COLONOSCOPY WITH PROPOFOL  N/A 10/02/2022   Procedure: COLONOSCOPY WITH PROPOFOL ;  Surgeon: Therisa Bi, MD;  Location: [facility] ENDOSCOPY;  Service: Gastroenterology;  Laterality: N/A;   COLONOSCOPY WITH PROPOFOL  N/A 11/13/2022   Procedure: COLONOSCOPY WITH PROPOFOL ;  Surgeon: Therisa Bi, MD;  Location: [facility] ENDOSCOPY;  Service: Gastroenterology;  Laterality: N/A;   ESOPHAGOGASTRODUODENOSCOPY N/A 05/17/2015   Procedure: ESOPHAGOGASTRODUODENOSCOPY (EGD);  Surgeon: Lamar ONEIDA Holmes, MD;  Location: [facility] ENDOSCOPY;  Service: Endoscopy;  Laterality: N/A;   ESOPHAGOGASTRODUODENOSCOPY N/A 09/25/2022   Procedure: ESOPHAGOGASTRODUODENOSCOPY (EGD);  Surgeon: Therisa Bi, MD;  Location: [facility] ENDOSCOPY;  Service: Gastroenterology;  Laterality: N/A;   ESOPHAGOGASTRODUODENOSCOPY N/A 09/26/2022   Procedure: ESOPHAGOGASTRODUODENOSCOPY (EGD);  Surgeon: Therisa Bi, MD;  Location: [facility] ENDOSCOPY;  Service: Gastroenterology;  Laterality: N/A;   KNEE ARTHROSCOPY     spg     6/18   Tibial Tubercle Bypass Right 1998   TUBAL LIGATION  10/01/1999   Past Medical History:  Diagnosis Date   Acute postoperative pain 04/07/2017   Anxiety    Bursitis    Chronic fatigue 12/12/2017   Chronic fatigue syndrome    Colitis 2021   Diabetes mellitus without complication (HCC)    Edema leg 05/02/2015   Fibromyalgia    GERD (gastroesophageal reflux disease)    IBS (irritable bowel syndrome)    Knee pain, bilateral 12/21/2008   Qualifier: Diagnosis of  By: Gladis FNP, Nykedtra     Lumbar discitis    Migraines    Osteoarthritis    Right hand pain 04/10/2015   [facility] has done nerve conduction studies and ruled out carpal tunnel.    Sleep apnea    Spinal stenosis    SVT (supraventricular tachycardia) (HCC)    Vertigo    Vitamin D  deficiency 05/01/2016   Ht 5' 3 (1.6 m)   Wt 228 lb (103.4 kg)   BMI 40.39 kg/m   Opioid Risk Score:   Fall  Risk Score:  `1  Depression screen PHQ 2/9     10/09/2023    2:16 PM 10/06/2023   11:59 AM 08/01/2023   11:03 AM 06/30/2023    2:25 PM 05/12/2023  2:54 PM 02/13/2023    9:43 AM 02/04/2023   10:05 AM  Depression screen PHQ 2/9  Decreased Interest 0 1 0 1 2 3 2   Down, Depressed, Hopeless 0 1 0 1 3 3 2   PHQ - 2 Score 0 2 0 2 5 6 4   Altered sleeping     2 3   Tired, decreased energy     2 3   Change in appetite     2 1   Feeling bad or failure about yourself      2 0   Trouble concentrating     2 1   Moving slowly or fidgety/restless     0 0   Suicidal thoughts     0 0   PHQ-9 Score     15 14   Difficult doing work/chores     Very difficult Very difficult       Review of Systems  Musculoskeletal:  Positive for back pain and gait problem.  All other systems reviewed and are negative.     Objective:   Physical Exam General No acute distress, positive morbid obesity Mood and affect are appropriate Extremities without edema Sensation intact to light touch bilateral lower extremities Negative straight leg raising bilateral lower extremities 5/5 strength in bilateral hip flexor knee extensor ankle dorsiflexor MSK tenderness palpation left PSIS area greater than lumbar area mild tenderness over the greater trochanters Ambulates with a cane forward flexed posture short step length       Assessment & Plan:  1.  Fibromyalgia syndrome with widespread body pain and increased pain sensitivity.  Continue pregabalin  150 mg 3 times daily as well as amitriptyline  150 mg nightly.  She would benefit from size program 2.  Chronic migraines as well as occipital headaches follow-up with Dr. Lorilee, cont CGRP antagonist  3. Sacroiliac disorder, may get short-term benefit from sacroiliac injection however given her history of requiring IV sedation, would not do at this office. In addition to the above medications for prior pain management physician prescribed small amounts of hydrocodone  7.5 mg.   Her last prescription was for 25 tablets and was filled 02/26/2023.  She has recently run out of these.  She requests another prescription.  We discussed the need to do a controlled substance agreement as well as urine drug screen testing.  Have renewed her current medication and do not anticipate that she will need this filled for another 6 months.  She does have an appointment with Dr. Lorilee  in approximately 3 months

## 2023-10-09 NOTE — Patient Instructions (Signed)
 Will do urine screen  Will do Controlled substance agreement No injections recommended

## 2023-10-10 ENCOUNTER — Telehealth: Payer: Self-pay

## 2023-10-10 NOTE — Telephone Encounter (Signed)
 PA approved.

## 2023-10-10 NOTE — Telephone Encounter (Signed)
 PA for Hydrocodone Submitted  Dana Bradley (Key: [id number])

## 2023-10-14 LAB — TOXASSURE SELECT,+ANTIDEPR,UR

## 2023-10-17 ENCOUNTER — Encounter: Payer: Self-pay | Admitting: Physical Medicine and Rehabilitation

## 2023-10-17 ENCOUNTER — Encounter: Payer: Self-pay | Admitting: Gastroenterology

## 2023-10-20 ENCOUNTER — Other Ambulatory Visit: Payer: Self-pay | Admitting: Physical Medicine and Rehabilitation

## 2023-10-20 ENCOUNTER — Other Ambulatory Visit: Payer: Self-pay

## 2023-10-20 MED ORDER — TRULANCE 3 MG PO TABS: ORAL_TABLET | ORAL | 3 refills | Status: DC

## 2023-10-20 NOTE — Progress Notes (Unsigned)
    GYNECOLOGY PROGRESS NOTE  Subjective:  PCP: Danelle Berry, PA-C  Patient ID: Dana Bradley, female    DOB: 06-13-1971, 53 y.o.   MRN: [id number]  HPI  Patient is a 53 y.o. [id number] female who presents for bleeding since 09/14/23, period has been heavy has been bright red, now light and darker red. She is s/p endometrial ablation and was on Provera for several years for period suppression, stopped taking in Jan '24, had some breakthrough bleeding and then a full period in Aug '24.  We met in Dec '24 and pt was wanting to get back on the Provera. Been on the provera since the 09/16/23. Has not had a period like this since ablation. Pt reports a pain and cramping, pelvic pain back pain and back pain.   The following portions of the patient's history were reviewed and updated as appropriate: allergies, current medications, past family history, past medical history, past social history, past surgical history, and problem list.  Review of Systems Pertinent items are noted in HPI.   Objective:   Blood pressure (!) 130/90, height 5\' 3"  (1.6 m), weight 230 lb (104.3 kg), last menstrual period 09/14/2023. Body mass index is 40.74 kg/m.  General appearance: alert and cooperative Abdomen: soft, non-tender; bowel sounds normal; no masses,  no organomegaly Pelvic: cervix normal in appearance, external genitalia normal, no adnexal masses or tenderness, no cervical motion tenderness, rectovaginal septum normal, uterus normal size, shape, and consistency, and vagina normal without discharge Extremities: extremities normal, atraumatic, no cyanosis or edema Neurologic: Grossly normal   Endometrial Biopsy Procedure Note  The patient was positioned on the exam table in the dorsal lithotomy position. Bimanual exam confirmed uterine position and size. A Graves speculum was placed into the vagina. A single toothed tenaculum was placed onto the anterior lip of the cervix. The pipette was placed into the  endocervical canal and advanced to the uterine fundus. Using a piston like technique, with vacuum created by withdrawing the stylus, the endometrial specimen was obtained and transferred to the biopsy container. Minimal bleeding encountered. The procedure was well tolerated.   Uterine Position: mid anterior posterior   Uterine Length: 9 cm   Uterine Specimen: Scant  Assessment/Plan:   1. Abnormal uterine bleeding (AUB)    Orders Placed This Encounter  Procedures   US PELVIC COMPLETE WITH TRANSVAGINAL    Standing Status:   Future    Expected Date:   10/28/2023    Expiration Date:   10/20/2024    Reason for Exam (SYMPTOM  OR DIAGNOSIS REQUIRED):   AUB    Preferred imaging location?:   [facility]   Estradiol   Suspect pt is perimenopausal and still menstruating irregularly. Will check FSH/E2 today. She does have risk factors, so EMB was performed today. We will obtain a pelvic US as well. Will call with results and next steps. Pt is considering a hysterectomy.    Julieanne Manson, DO [facility]

## 2023-10-21 ENCOUNTER — Ambulatory Visit: Payer: Medicaid Other | Admitting: Obstetrics

## 2023-10-21 ENCOUNTER — Other Ambulatory Visit (HOSPITAL_COMMUNITY)
Admission: RE | Admit: 2023-10-21 | Discharge: 2023-10-21 | Disposition: A | Discharge status: Home / Self Care | Payer: Medicaid Other | Source: Ambulatory Visit | Attending: Obstetrics | Admitting: Obstetrics

## 2023-10-21 ENCOUNTER — Encounter: Payer: Self-pay | Admitting: Obstetrics

## 2023-10-21 VITALS — BP 130/90 | Ht 63.0 in | Wt 230.0 lb

## 2023-10-21 DIAGNOSIS — N939 Abnormal uterine and vaginal bleeding, unspecified: Secondary | ICD-10-CM

## 2023-10-21 DIAGNOSIS — N858 Other specified noninflammatory disorders of uterus: Secondary | ICD-10-CM

## 2023-10-22 ENCOUNTER — Other Ambulatory Visit: Payer: Self-pay | Admitting: Physical Medicine and Rehabilitation

## 2023-10-23 LAB — SURGICAL PATHOLOGY

## 2023-10-23 LAB — FOLLICLE STIMULATING HORMONE: FSH: 7.6 m[IU]/mL

## 2023-10-23 LAB — ESTRADIOL: Estradiol: 38.4 pg/mL

## 2023-11-04 DIAGNOSIS — G4733 Obstructive sleep apnea (adult) (pediatric): Secondary | ICD-10-CM | POA: Diagnosis not present

## 2023-11-07 ENCOUNTER — Other Ambulatory Visit: Payer: Self-pay | Admitting: Nurse Practitioner

## 2023-11-07 DIAGNOSIS — I471 Supraventricular tachycardia, unspecified: Secondary | ICD-10-CM

## 2023-11-10 ENCOUNTER — Encounter: Payer: Self-pay | Admitting: Family Medicine

## 2023-11-10 ENCOUNTER — Ambulatory Visit: Payer: Self-pay

## 2023-11-10 ENCOUNTER — Ambulatory Visit: Payer: Medicaid Other | Admitting: Family Medicine

## 2023-11-10 ENCOUNTER — Telehealth: Payer: Self-pay

## 2023-11-10 VITALS — BP 132/84 | HR 86 | Resp 16 | Ht 63.0 in | Wt 228.0 lb

## 2023-11-10 DIAGNOSIS — E119 Type 2 diabetes mellitus without complications: Secondary | ICD-10-CM

## 2023-11-10 DIAGNOSIS — M5442 Lumbago with sciatica, left side: Secondary | ICD-10-CM | POA: Diagnosis not present

## 2023-11-10 DIAGNOSIS — R739 Hyperglycemia, unspecified: Secondary | ICD-10-CM

## 2023-11-10 LAB — GLUCOSE, POCT (MANUAL RESULT ENTRY): POC Glucose: 127 mg/dL — AB (ref 70–99)

## 2023-11-10 MED ORDER — PEG 3350-KCL-NA BICARB-NACL 420 G PO SOLR: ORAL | 0 refills | Status: DC

## 2023-11-10 MED ORDER — PREDNISONE 20 MG PO TABS
40.0000 mg | ORAL_TABLET | Freq: Every day | ORAL | 0 refills | Status: AC
Start: 2023-11-10 — End: 2023-11-15

## 2023-11-10 NOTE — Telephone Encounter (Signed)
 Patient called stating that she continues to be constipated by taking her daily Trulance  and 3 stool softeners daily and it is not working for her. Patient requested to be seen since she is feeling miserable. I told her that I would have to schedule her with Brigitte Canard our PA and she stated that she would take it. Patient stated that she will be coming. I let her know that I would be sending her a GoLytely prescription to help her get cleaned out and to continue on her Trulance . Patient understood and had no further questions.

## 2023-11-10 NOTE — Progress Notes (Signed)
 Patient ID: Dana Bradley, female    DOB: 04-Feb-1971, 53 y.o.   MRN: [id number]  PCP: Adeline Hone, PA-C  Chief Complaint  Patient presents with   Back Pain    x2-3 weeks   Hyperglycemia    Subjective:   Dana Bradley is a 53 y.o. female, presents to clinic with CC of the following:  HPI   Here with concerns of higher sugars, T2DM Lab Results  Component Value Date   HGBA1C 7.0 (H) 09/19/2023  On metformin  1000 mg BID Results for orders placed or performed in visit on 11/10/23  POCT Glucose (CBG)   Collection Time: 11/10/23  4:08 PM  Result Value Ref Range   POC Glucose 127 (A) 70 - 99 mg/dl   *Note: Due to a large number of results and/or encounters for the requested time period, some results have not been displayed. A complete set of results can be found in Results Review.   Libre 2 reader and sensor x 7-8 d is reading very high with what she is eating 200-300's spikes for several hours Recent death in family, she states she will be back to normal eating habits in a few days Some dry mouth, increased thirst and urinary frequency  Back pain, acute on chronic - hx of chronic pain to various locations, previously est with pain management and PM&R Liam Redhead, MD  She wants steroid burst to help it calm down    Patient Active Problem List   Diagnosis Date Noted   Pharmacologic therapy 03/12/2023   Chronic use of opiate for therapeutic purpose 03/12/2023   Abnormal CT of the abdomen 11/13/2022   Adenomatous polyp of colon 11/13/2022   Herniated nucleus pulposus, L3-4 (Right) 10/29/2022   Lumbar radiculopathy 10/21/2022   Dyspepsia 09/26/2022   Blood in stool 09/26/2022   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HCC) 04/04/2022   Neurogenic bladder 01/28/2022   Urinary and fecal incontinence 01/28/2022   New onset type 2 diabetes mellitus (HCC) 01/02/2022   Excessive daytime sleepiness 12/14/2021   Lateral epicondylitis, left elbow 07/19/2021   Abnormal MRI, cervical spine  (02/14/2021) 02/28/2021   Cervicalgia 02/13/2021   Abnormal MRI, lumbar spine (12/27/2021) 11/09/2020   Chronic midline thoracic back pain 11/09/2020   Abnormal MRI, thoracic spine (08/02/2019) 11/09/2020   Thoracic spinal stenosis (T7-8) 11/09/2020   Prolapse of thoracic disc with radiculopathy (T7-8) 11/09/2020   Spasm of muscle of lower back 11/09/2020   Vertigo, benign paroxysmal, unspecified laterality 11/09/2020   Uncomplicated opioid dependence (HCC) 11/08/2020   Hyperalgesia 03/07/2020   Neurogenic urinary incontinence 03/01/2020   Other intervertebral disc degeneration, lumbar region 03/01/2020   Spinal stenosis of lumbosacral region 03/01/2020   Colon cancer screening 02/06/2020   Lumbar radiculitis (Right) 09/21/2019   Chronic lower extremity pain (Bilateral) 09/06/2019   Intractable migraine with aura without status migrainosus 08/10/2019   Other specified dorsopathies, sacral and sacrococcygeal region 08/03/2019   Latex precautions, history of latex allergy 08/03/2019   History of allergy to radiographic contrast media 08/03/2019   DDD (degenerative disc disease), cervical 07/21/2019   Cervical facet syndrome (Bilateral) (L>R) 07/21/2019   DDD (degenerative disc disease), thoracic 07/21/2019   Osteoarthritis of hip (Left) 07/21/2019   Chronic groin pain (Bilateral) (L>R) 07/21/2019   Chronic hip pain (Bilateral) (L>R) 07/21/2019   Somatic dysfunction of sacroiliac joint (Bilateral) 07/21/2019   Migraine with aura and with status migrainosus, not intractable 04/06/2019   Cervico-occipital neuralgia (Left) 04/06/2019   Weakness of leg (Left)  04/05/2019   Difficulty walking 04/05/2019   Chronic migraine without aura, with intractable migraine, so stated, with status migrainosus 12/27/2018   Malar rash 09/04/2018   Trigger point of neck (Left) 03/19/2018   Occipital headache 12/25/2017   Chronic fatigue syndrome with fibromyalgia 12/12/2017   Trigger point of shoulder  region (Left) 11/17/2017   Myofascial pain syndrome (Left) (trapezius muscle) 07/22/2017   Lumbar L1-2 disc protrusion (Right) 04/07/2017   Muscle spasticity 04/01/2017   Osteoarthritis of shoulder (Bilateral) 04/01/2017   Lumbar spondylosis 01/06/2017   Chronic hip pain (Left) 12/24/2016   Chronic sacroiliac joint pain (Left) 12/24/2016   Lumbar facet syndrome (Bilateral) (L>R) 12/24/2016   Lumbar radiculitis (Left) 12/24/2016   Hypertriglyceridemia 11/27/2016   History of vasovagal episode 10/30/2016   Cervicogenic headache 09/09/2016   Medication monitoring encounter 08/29/2016   Controlled substance agreement signed 08/28/2016   Plantar fasciitis of left foot 08/28/2016   Vitamin B12 deficiency 08/28/2016   Hyperlipidemia 08/28/2016   Nephrolithiasis 08/12/2016   Chronic pain syndrome 08/07/2016   Long term current use of opiate analgesic 08/07/2016   Long term prescription opiate use 08/07/2016   Opiate use 08/07/2016   Long term prescription benzodiazepine use 08/07/2016   Neurogenic pain 08/07/2016   Chronic low back pain (1ry area of Pain) (Bilateral) (R>L) (midline) w/ sciatica (Bilateral) 08/07/2016   Chronic upper back pain (2ry area of Pain) (Bilateral) (L>R) 08/07/2016   Chronic abdominal pain (Right lower quadrant) 08/07/2016   Thoracic radiculitis (Bilateral: T10, T11) 08/07/2016   Chronic occipital neuralgia (3ry area of Pain) (Bilateral) (L>R) 08/07/2016   Chronic neck pain 08/07/2016   Chronic cervical radicular pain (Bilateral) (L>R) 08/07/2016   Chronic shoulder blade pain (Bilateral) (L>R) 08/07/2016   Chronic upper extremity pain (Bilateral) (R>L) 08/07/2016   Chronic knee pain (Bilateral) (R>L) 08/07/2016   Chronic ankle pain (Bilateral) 08/07/2016   Cervical spondylosis with myelopathy and radiculopathy 08/07/2016   Panic disorder with agoraphobia 05/29/2016   Depression, unspecified depression type 05/29/2016   Atypical lymphocytosis 05/01/2016    Vitamin D  deficiency 05/01/2016   Chronic lower extremity cramps (Bilateral) (R>L) 04/29/2016   Obesity, Class III, BMI 40-49.9 (morbid obesity) (HCC) 04/29/2016   GAD (generalized anxiety disorder) 04/29/2016   Fatigue 04/29/2016   Insomnia 07/12/2015   Migraine without aura and with status migrainosus, not intractable 07/12/2015   Chronic superficial gastritis 06/02/2015   Chronic pain of multiple joints 05/15/2015   Bilateral leg edema 05/02/2015   Paroxysmal supraventricular tachycardia (HCC) 04/17/2015   Exertional shortness of breath 04/17/2015   Bright red rectal bleeding 04/06/2015   DDD (degenerative disc disease), lumbosacral 01/24/2014   DDD (degenerative disc disease), lumbar 01/24/2014   Cervico-occipital neuralgia 12/29/2013   Fibromyalgia 12/29/2013   Migraine headache 12/29/2013   Menorrhagia 12/10/2012   Depression, major, recurrent, in remission (HCC) 01/12/2009   Chest pain 01/12/2009   Hypertension, benign essential, goal below 140/90 06/23/2008   History of PSVT (paroxysmal supraventricular tachycardia) 06/17/2008   Obstructive sleep apnea 06/17/2008   GERD 06/13/2008      Current Outpatient Medications:    amitriptyline  (ELAVIL ) 150 MG tablet, Take 1 tablet (150 mg total) by mouth at bedtime., Disp: 90 tablet, Rfl: 3   Atogepant  (QULIPTA ) 30 MG TABS, Take 1 tablet (30 mg total) by mouth daily as needed., Disp: 30 tablet, Rfl: 3   atorvastatin  (LIPITOR) 40 MG tablet, TAKE ONE TABLET BY MOUTH EVERY NIGHT AT BEDTIME, Disp: 90 tablet, Rfl: 3   baclofen  (LIORESAL ) 10 MG  tablet, Take 0.5-1 tablets (5-10 mg total) by mouth 3 (three) times daily. Each refill must last 30 days., Disp: 270 tablet, Rfl: 1   butalbital -acetaminophen -caffeine  (BAC) 50-325-40 MG tablet, Take 1 tablet by mouth every 4 (four) hours as needed for migraine., Disp: 60 tablet, Rfl: 3   cyanocobalamin  (,VITAMIN B-12,) 1000 MCG/ML injection, INJECT 1 ML (1,000 MCG) INTO THE MUSCLE ONCE FOR 1 DOSE,  Disp: 1 mL, Rfl: 3   diclofenac  Sodium (VOLTAREN ) 1 % GEL, Apply topically., Disp: , Rfl:    dicyclomine  (BENTYL ) 10 MG capsule, Take by mouth., Disp: , Rfl:    Eptinezumab -jjmr (VYEPTI ) 100 MG/ML injection, Inject 3 mLs (300 mg total) into the vein every 3 (three) months., Disp: 3 mL, Rfl: 3   meclizine  (ANTIVERT ) 12.5 MG tablet, Take 1 tablet (12.5 mg total) by mouth 3 (three) times daily as needed for dizziness., Disp: 30 tablet, Rfl: 0   medroxyPROGESTERone  (PROVERA ) 5 MG tablet, Take 1 tablet (5 mg total) by mouth daily., Disp: 90 tablet, Rfl: 3   metFORMIN  (GLUCOPHAGE ) 1000 MG tablet, TAKE ONE TABLET BY MOUTH TWICE A DAY WITH A MEAL, Disp: 180 tablet, Rfl: 1   metoprolol  succinate (TOPROL -XL) 100 MG 24 hr tablet, TAKE ONE TABLET (100 MG TOTAL) BY MOUTH DAILY. TAKE WITH OR IMMEDIATELY FOLLOWING A MEAL., Disp: 90 tablet, Rfl: 0   Needles & Syringes MISC, For administration of B12 injections, Disp: 30 each, Rfl: 0   ondansetron  (ZOFRAN ) 4 MG tablet, Take 1 tablet (4 mg total) by mouth every 8 (eight) hours as needed., Disp: 20 tablet, Rfl: 2   Plecanatide  (TRULANCE ) 3 MG TABS, Take 1 (one) tablet daily in the morning., Disp: 90 tablet, Rfl: 3   polyethylene glycol-electrolytes (NULYTELY) 420 g solution, Drink one 8 oz glass of mixture every 15 minutes until you finish all of the jug., Disp: 4000 mL, Rfl: 0   pregabalin  (LYRICA ) 150 MG capsule, Take 1 capsule (150 mg total) by mouth 3 (three) times daily as needed., Disp: 270 capsule, Rfl: 3   rizatriptan  (MAXALT ) 10 MG tablet, Take 1 tablet (10 mg total) by mouth as needed for migraine. May repeat in 2 hours if needed, Disp: 10 tablet, Rfl: 3   scopolamine  (TRANSDERM-SCOP) 1 MG/3DAYS, Place 1 patch (1.5 mg total) onto the skin every 3 (three) days., Disp: 10 patch, Rfl: 12   triamcinolone  cream (KENALOG ) 0.1 %, Apply 1 Application topically 2 (two) times daily as needed (itch/rash). Do not apply to open skin., Disp: 30 g, Rfl: 0    HYDROcodone -acetaminophen  (NORCO) 7.5-325 MG tablet, Take 1 tablet by mouth 2 (two) times daily as needed for severe pain (pain score 7-10). Must last 30 days, Disp: 25 tablet, Rfl: 0   Allergies  Allergen Reactions   Aspirin Swelling   Cephalexin Rash   Cymbalta [Duloxetine Hcl] Other (See Comments)    Suicidal ideations and has homicidal thoughts per patient   Depakote [Divalproex Sodium] Shortness Of Breath    W/ n/v   Gadolinium Derivatives     Pt was unable to breath Other reaction(s): Other (See Comments) Pt was unable to breath   Haloperidol Shortness Of Breath    W/ n/v   Meperidine Nausea And Vomiting    Patient projectile vomits and usually result in ER Other reaction(s): Vomiting   Metoclopramide Shortness Of Breath    Can't breath, wheezes  Other reaction(s): Other (See Comments)  Can't breath, wheezes Other reaction(s): Other (See Comments)   Morphine  Other reaction(s): Vomiting   Penicillins Rash    Other reaction(s): Unknown   Prochlorperazine Other (See Comments)    Panic attack Other reaction(s): Other (See Comments)   Tramadol  Hcl Palpitations    Severely and adversely affects her SVT giving her tachycardias of 150-160 bpm.   Trazodone Shortness Of Breath    Other reaction(s): Other (See Comments)   Meloxicam Other (See Comments)    mouth sores, tingling, blisters in mouth Other reaction(s): Other (See Comments) mout sores, tingling mouth sores, tingling, blisters in mouth   Neomycin-Bacitracin Zn-Polymyx Rash   Tomato Hives    Tongue will blister   Egg-Derived Products Other (See Comments)    inflammation   Milk-Related Compounds Other (See Comments)    inflammation   Other     Other reaction(s): Other (See Comments)   Shellfish Allergy     Other reaction(s): Unknown   Shellfish-Derived Products Other (See Comments)    Other reaction(s): Unknown Other reaction(s): Unknown   Bacitra-Neomycin-Polymyxin-Hc Rash    Bacitracin-Neomycin-Polymyxin Rash   Cephalosporins Rash    rash   Ibuprofen Other (See Comments) and Rash    Blisters in mouth. Blisters in mouth.   Latex Itching    Other reaction(s): Unknown   Nsaids Other (See Comments)    Blisters in mouth; can take 1 ibuprofen 2x a month Other reaction(s): Other (See Comments), Unknown Blisters in mouth; can take 1 ibuprofen 2x a month   Sulfa Antibiotics Rash    rash Other reaction(s): Unknown rash  Other reaction(s): Unknown rash rash Other reaction(s): Unknown rash   Sulfonamide Derivatives Rash     Social History   Tobacco Use   Smoking status: Former    Current packs/day: 0.00    Average packs/day: 2.0 packs/day for 3.0 years (6.0 ttl pk-yrs)    Types: Cigarettes    Start date: 12/10/1989    Quit date: 12/10/1992    Years since quitting: 30.9   Smokeless tobacco: Never   Tobacco comments:    quit 25 years ago  Vaping Use   Vaping status: Never Used  Substance Use Topics   Alcohol use: No    Comment: socially   Drug use: No      Chart Review Today: I personally reviewed active problem list, medication list, allergies, family history, social history, health maintenance, notes from last encounter, lab results, imaging with the patient/caregiver today.   Review of Systems  Constitutional: Negative.   HENT: Negative.    Eyes: Negative.   Respiratory: Negative.    Cardiovascular: Negative.   Gastrointestinal: Negative.   Endocrine: Negative.   Genitourinary: Negative.   Musculoskeletal: Negative.   Skin: Negative.   Allergic/Immunologic: Negative.   Neurological: Negative.   Hematological: Negative.   Psychiatric/Behavioral: Negative.    All other systems reviewed and are negative.      Objective:   Vitals:   11/10/23 1603  BP: 132/84  Pulse: 86  Resp: 16  SpO2: 99%  Weight: 228 lb (103.4 kg)  Height: 5\' 3"  (1.6 m)    Body mass index is 40.39 kg/m.  Physical Exam Vitals and nursing note  reviewed.  Constitutional:      General: She is not in acute distress.    Appearance: She is well-developed. She is obese. She is not ill-appearing, toxic-appearing or diaphoretic.  HENT:     Head: Normocephalic and atraumatic.     Nose: Nose normal.  Eyes:     General:  Right eye: No discharge.        Left eye: No discharge.     Conjunctiva/sclera: Conjunctivae normal.  Neck:     Trachea: No tracheal deviation.  Cardiovascular:     Rate and Rhythm: Normal rate and regular rhythm.  Pulmonary:     Effort: Pulmonary effort is normal. No respiratory distress.     Breath sounds: No stridor.  Skin:    General: Skin is warm and dry.     Findings: No rash.  Neurological:     Mental Status: She is alert.     Motor: No abnormal muscle tone.     Coordination: Coordination normal.     Gait: Gait abnormal (using cane).  Psychiatric:        Behavior: Behavior normal.      Results for orders placed or performed in visit on 11/10/23  POCT Glucose (CBG)   Collection Time: 11/10/23  4:08 PM  Result Value Ref Range   POC Glucose 127 (A) 70 - 99 mg/dl   *Note: Due to a large number of results and/or encounters for the requested time period, some results have not been displayed. A complete set of results can be found in Results Review.       Assessment & Plan:   1. Hyperglycemia (Primary) Results for orders placed or performed in visit on 11/10/23  POCT Glucose (CBG)   Collection Time: 11/10/23  4:08 PM  Result Value Ref Range   POC Glucose 127 (A) 70 - 99 mg/dl   *Note: Due to a large number of results and/or encounters for the requested time period, some results have not been displayed. A complete set of results can be found in Results Review.   Highs on CGM 200-300+ - POCT Glucose (CBG)  2. Type 2 diabetes mellitus without complication, without long-term current use of insulin (HCC) Sugars high - eating more carbs/sweets Will continue monitoring and come for f/up A1C  and f/up in March   3. Left-sided low back pain with left-sided sciatica, unspecified chronicity Would like burst of steroids Understands this will increase sugars transiently  - predniSONE  (DELTASONE ) 20 MG tablet; Take 2 tablets (40 mg total) by mouth daily with breakfast for 5 days.  Dispense: 10 tablet; Refill: 0   Return in about 5 weeks (around 12/18/2023) for 3/20 or after DM f/up and A1C in office.    Adeline Hone, PA-C 11/10/23 4:33 PM

## 2023-11-10 NOTE — Telephone Encounter (Signed)
  Chief Complaint: back pain Symptoms: mid to lower back pain, radiates down both legs, 6/10 when resting, 10/10 with movement, having to use assistive device, numbness in L foot  Frequency: 2 weeks  Pertinent Negatives: NA Disposition: [] ED /[] Urgent Care (no appt availability in office) / [x] Appointment(In office/virtual)/ []  [facility]/ [] Home Care/ [] Refused Recommended Disposition /[] [facility]/ []  Follow-up with PCP Additional Notes: pt states she slipped approx 2 weeks ago, caught her self so she didn't fall, has been having to move self since mother passing and dealing with back pain. Unable to take NSAIDs d/t allergy. Pt requesting steroid dose pack for inflammation. Pt also states her CBGs have been fluctuating often with or w/o eating and unsure cause. Advised that increased stress and inflammation can cause increase in CBG but pt could discuss with PCP at OV. Scheduled OV today at 1600.   Reason for Disposition  [1] Pain radiates into the thigh or further down the leg AND [2] both legs  Answer Assessment - Initial Assessment Questions 1. ONSET: "When did the pain begin?"      2 weeks  2. LOCATION: "Where does it hurt?" (upper, mid or lower back)     T7 area to toes  3. SEVERITY: "How bad is the pain?"  (e.g., Scale 1-10; mild, moderate, or severe)   - MILD (1-3): Doesn't interfere with normal activities.    - MODERATE (4-7): Interferes with normal activities or awakens from sleep.    - SEVERE (8-10): Excruciating pain, unable to do any normal activities.      6/10 when resting, 10/10 4. PATTERN: "Is the pain constant?" (e.g., yes, no; constant, intermittent)      Constant  6. CAUSE:  "What do you think is causing the back pain?"      Had slipped and caught self and has been moved stuff since mother passed  8. MEDICINES: "What have you taken so far for the pain?" (e.g., nothing, acetaminophen , NSAIDS)     Has pain medicine  10. OTHER SYMPTOMS: "Do you  have any other symptoms?" (e.g., fever, abdomen pain, burning with urination, blood in urine)       None  Protocols used: Back Pain-A-AH

## 2023-11-10 NOTE — Telephone Encounter (Signed)
 Requested Prescriptions  Pending Prescriptions Disp Refills   metoprolol  succinate (TOPROL -XL) 100 MG 24 hr tablet [Pharmacy Med Name: METOPROLOL  SUCCINATE ER 100MG  ER TABLET ER 24HR] 90 tablet 0    Sig: TAKE ONE TABLET (100 MG TOTAL) BY MOUTH DAILY. TAKE WITH OR IMMEDIATELY FOLLOWING A MEAL.     Cardiovascular:  Beta Blockers Failed - 11/10/2023  9:09 AM      Failed - Last BP in normal range    BP Readings from Last 1 Encounters:  10/21/23 (!) 130/90         Failed - Valid encounter within last 6 months    Recent Outpatient Visits           6 months ago Type 2 diabetes mellitus without complication, without long-term current use of insulin [facility])   [place] [facility] Adeline Hone, PA-C   9 months ago Type 2 diabetes mellitus without complication, without long-term current use of insulin [facility])   [facility] Adeline Hone, PA-C   10 months ago Near syncope   [facility] Adeline Hone, [place]   11 months ago Neck mass   [facility] Adeline Hone, PA-C   12 months ago Pharyngitis, unspecified etiology   [facility] Adeline Hone, PA-C       Future Appointments             In 3 weeks Adeline Hone, PA-C [facility]            Passed - Last Heart Rate in normal range    Pulse Readings from Last 1 Encounters:  10/09/23 (!) 107

## 2023-11-14 ENCOUNTER — Other Ambulatory Visit: Payer: Self-pay

## 2023-11-18 ENCOUNTER — Other Ambulatory Visit: Payer: Self-pay | Admitting: Physical Medicine and Rehabilitation

## 2023-11-18 ENCOUNTER — Encounter: Payer: Self-pay | Admitting: Physical Medicine and Rehabilitation

## 2023-11-18 DIAGNOSIS — M797 Fibromyalgia: Secondary | ICD-10-CM

## 2023-11-18 MED ORDER — PREGABALIN 150 MG PO CAPS: 150.0000 mg | ORAL_CAPSULE | Freq: Three times a day (TID) | ORAL | 3 refills | Status: DC | PRN

## 2023-11-20 ENCOUNTER — Other Ambulatory Visit: Payer: Medicaid Other

## 2023-11-20 NOTE — Progress Notes (Signed)
Dana Amy, PA-C [address]  [address]  [place], [place] [address]  Main: [phone]  Fax: [phone]   Primary Care Physician: Danelle Berry, PA-C  Primary Gastroenterologist:  Dana Amy, PA-C / Dr. Wyline Mood    CC: Severe constipation  HPI: Dana Bradley is a 53 y.o. female, established patient Dr. Tobi Bastos, presents for evaluation follow-up of chronic constipation.  She has history of internal hemorrhoids with bleeding.  Colonoscopy 10/2022 showed internal hemorrhoids, otherwise negative.  First internal hemorrhoids banding 05/20/2023 right anterior column banded. Second internal hemorrhoid banding 06/23/2023 banded LL internal hemorrhoids.  Treatment for constipation: Was prescribed Trulance 10/20/2023.  Is on hydrocodone for chronic pain.  Dicyclomine as needed abdominal cramping/IBS.  Current Outpatient Medications  Medication Sig Dispense Refill   amitriptyline (ELAVIL) 150 MG tablet Take 1 tablet (150 mg total) by mouth at bedtime. 90 tablet 3   Atogepant (QULIPTA) 30 MG TABS Take 1 tablet (30 mg total) by mouth daily as needed. 30 tablet 3   atorvastatin (LIPITOR) 40 MG tablet TAKE ONE TABLET BY MOUTH EVERY NIGHT AT BEDTIME 90 tablet 3   baclofen (LIORESAL) 10 MG tablet Take 0.5-1 tablets (5-10 mg total) by mouth 3 (three) times daily. Each refill must last 30 days. 270 tablet 1   butalbital-acetaminophen-caffeine (BAC) 50-325-40 MG tablet Take 1 tablet by mouth every 4 (four) hours as needed for migraine. 60 tablet 3   cyanocobalamin (,VITAMIN B-12,) 1000 MCG/ML injection INJECT 1 ML (1,000 MCG) INTO THE MUSCLE ONCE FOR 1 DOSE 1 mL 3   diclofenac Sodium (VOLTAREN) 1 % GEL Apply topically.     dicyclomine (BENTYL) 10 MG capsule Take by mouth.     Eptinezumab-jjmr (VYEPTI) 100 MG/ML injection Inject 3 mLs (300 mg total) into the vein every 3 (three) months. 3 mL 3   HYDROcodone-acetaminophen (NORCO) 7.5-325 MG tablet Take 1 tablet by mouth 2 (two) times daily as  needed for severe pain (pain score 7-10). Must last 30 days 25 tablet 0   meclizine (ANTIVERT) 12.5 MG tablet Take 1 tablet (12.5 mg total) by mouth 3 (three) times daily as needed for dizziness. 30 tablet 0   medroxyPROGESTERone (PROVERA) 5 MG tablet Take 1 tablet (5 mg total) by mouth daily. 90 tablet 3   metFORMIN (GLUCOPHAGE) 1000 MG tablet TAKE ONE TABLET BY MOUTH TWICE A DAY WITH A MEAL 180 tablet 1   metoprolol succinate (TOPROL-XL) 100 MG 24 hr tablet TAKE ONE TABLET (100 MG TOTAL) BY MOUTH DAILY. TAKE WITH OR IMMEDIATELY FOLLOWING A MEAL. 90 tablet 0   Needles & Syringes MISC For administration of B12 injections 30 each 0   ondansetron (ZOFRAN) 4 MG tablet Take 1 tablet (4 mg total) by mouth every 8 (eight) hours as needed. 20 tablet 2   Plecanatide (TRULANCE) 3 MG TABS Take 1 (one) tablet daily in the morning. 90 tablet 3   polyethylene glycol-electrolytes (NULYTELY) 420 g solution Drink one 8 oz glass of mixture every 15 minutes until you finish all of the jug. 4000 mL 0   pregabalin (LYRICA) 150 MG capsule Take 1 capsule (150 mg total) by mouth 3 (three) times daily as needed. 270 capsule 3   rizatriptan (MAXALT) 10 MG tablet Take 1 tablet (10 mg total) by mouth as needed for migraine. May repeat in 2 hours if needed 10 tablet 3   scopolamine (TRANSDERM-SCOP) 1 MG/3DAYS Place 1 patch (1.5 mg total) onto the skin every 3 (three) days. 10 patch 12  triamcinolone cream (KENALOG) 0.1 % Apply 1 Application topically 2 (two) times daily as needed (itch/rash). Do not apply to open skin. 30 g 0   No current facility-administered medications for this visit.    Allergies as of 11/21/2023 - Review Complete 11/10/2023  Allergen Reaction Noted   Aspirin Swelling 06/13/2008   Cephalexin Rash 12/10/2012   Cymbalta [duloxetine hcl] Other (See Comments) 09/09/2016   Depakote [divalproex sodium] Shortness Of Breath 12/10/2012   Gadolinium derivatives  07/20/2019   Haloperidol Shortness Of Breath  05/15/2015   Meperidine Nausea And Vomiting 12/10/2012   Metoclopramide Shortness Of Breath 09/16/2016   Morphine  02/20/2022   Penicillins Rash 06/13/2008   Prochlorperazine Other (See Comments) 09/16/2016   Tramadol hcl Palpitations 09/09/2016   Trazodone Shortness Of Breath 01/23/2015   Meloxicam Other (See Comments) 03/17/2014   Neomycin-bacitracin zn-polymyx Rash 12/10/2012   Tomato Hives 04/29/2016   Egg-derived products Other (See Comments) 11/11/2022   Milk-related compounds Other (See Comments) 11/11/2022   Other  03/17/2014   Shellfish allergy  03/17/2014   Shellfish-derived products Other (See Comments) 03/17/2014   Bacitra-neomycin-polymyxin-hc Rash 09/12/2022   Bacitracin-neomycin-polymyxin Rash 05/15/2015   Cephalosporins Rash 05/15/2015   Ibuprofen Other (See Comments) and Rash 01/05/2013   Latex Itching 03/17/2014   Nsaids Other (See Comments) 03/17/2014   Sulfa antibiotics Rash 03/17/2014   Sulfonamide derivatives Rash 06/13/2008    Past Medical History:  Diagnosis Date   Acute postoperative pain 04/07/2017   Anxiety    Bursitis    Chronic fatigue 12/12/2017   Chronic fatigue syndrome    Colitis 2021   Diabetes mellitus without complication (HCC)    Edema leg 05/02/2015   Fibromyalgia    GERD (gastroesophageal reflux disease)    IBS (irritable bowel syndrome)    Knee pain, bilateral 12/21/2008   Qualifier: Diagnosis of  By: Daphine Deutscher FNP, [place]     Lumbar discitis    Migraines    Osteoarthritis    Right hand pain 04/10/2015   [facility] has done nerve conduction studies and ruled out carpal tunnel.    Sleep apnea    Spinal stenosis    SVT (supraventricular tachycardia) (HCC)    Vertigo    Vitamin D deficiency 05/01/2016    Past Surgical History:  Procedure Laterality Date   ABLATION     Uterine   BREAST BIOPSY Left    2014 U/S bx fibroadenoma   BREAST BIOPSY Left 05/28/2023   lr [place] bx 3:00 heart clip   BREAST BIOPSY Left 05/28/2023    [place] LT BREAST BX W LOC DEV 1ST LESION IMG BX SPEC US GUIDE 05/28/2023 [facility]   CARDIAC CATHETERIZATION     with ablation   COLONOSCOPY N/A 09/26/2022   Procedure: COLONOSCOPY;  Surgeon: Wyline Mood, MD;  Location: [facility] ENDOSCOPY;  Service: Gastroenterology;  Laterality: N/A;   COLONOSCOPY WITH PROPOFOL N/A 05/17/2015   Procedure: COLONOSCOPY WITH PROPOFOL;  Surgeon: Scot Jun, MD;  Location: [facility] ENDOSCOPY;  Service: Endoscopy;  Laterality: N/A;   COLONOSCOPY WITH PROPOFOL N/A 03/20/2020   Procedure: COLONOSCOPY WITH PROPOFOL;  Surgeon: Wyline Mood, MD;  Location: [facility] ENDOSCOPY;  Service: Gastroenterology;  Laterality: N/A;   COLONOSCOPY WITH PROPOFOL N/A 09/25/2022   Procedure: COLONOSCOPY WITH PROPOFOL;  Surgeon: Wyline Mood, MD;  Location: [facility] ENDOSCOPY;  Service: Gastroenterology;  Laterality: N/A;   COLONOSCOPY WITH PROPOFOL N/A 10/02/2022   Procedure: COLONOSCOPY WITH PROPOFOL;  Surgeon: Wyline Mood, MD;  Location: [facility] ENDOSCOPY;  Service: Gastroenterology;  Laterality: N/A;  COLONOSCOPY WITH PROPOFOL N/A 11/13/2022   Procedure: COLONOSCOPY WITH PROPOFOL;  Surgeon: Wyline Mood, MD;  Location: [facility] ENDOSCOPY;  Service: Gastroenterology;  Laterality: N/A;   ESOPHAGOGASTRODUODENOSCOPY N/A 05/17/2015   Procedure: ESOPHAGOGASTRODUODENOSCOPY (EGD);  Surgeon: Scot Jun, MD;  Location: [facility] ENDOSCOPY;  Service: Endoscopy;  Laterality: N/A;   ESOPHAGOGASTRODUODENOSCOPY N/A 09/25/2022   Procedure: ESOPHAGOGASTRODUODENOSCOPY (EGD);  Surgeon: Wyline Mood, MD;  Location: [facility] ENDOSCOPY;  Service: Gastroenterology;  Laterality: N/A;   ESOPHAGOGASTRODUODENOSCOPY N/A 09/26/2022   Procedure: ESOPHAGOGASTRODUODENOSCOPY (EGD);  Surgeon: Wyline Mood, MD;  Location: [facility] ENDOSCOPY;  Service: Gastroenterology;  Laterality: N/A;   KNEE ARTHROSCOPY     spg     6/18   Tibial Tubercle Bypass Right 1998   TUBAL LIGATION  10/01/1999    Review of Systems:    All systems reviewed  and negative except where noted in HPI.   Physical Examination:   There were no vitals taken for this visit.  General: Well-nourished, well-developed in no acute distress.  Lungs: Clear to auscultation bilaterally. Non-labored. Heart: Regular rate and rhythm, no murmurs rubs or gallops.  Abdomen: Bowel sounds are normal; Abdomen is Soft; No hepatosplenomegaly, masses or hernias;  No Abdominal Tenderness; No guarding or rebound tenderness. Neuro: Alert and oriented x 3.  Grossly intact.  Psych: Alert and cooperative, normal mood and affect.   Imaging Studies: No results found.  Assessment and Plan:   Dana Bradley is a 53 y.o. y/o female ***    Dana Amy, PA-C  Follow up ***  BP check ***

## 2023-11-21 ENCOUNTER — Ambulatory Visit: Payer: Medicaid Other | Admitting: Physician Assistant

## 2023-11-21 ENCOUNTER — Encounter: Payer: Self-pay | Admitting: Physician Assistant

## 2023-11-21 VITALS — BP 106/72 | HR 120 | Temp 98.3°F | Ht 63.0 in | Wt 224.2 lb

## 2023-11-21 DIAGNOSIS — K5909 Other constipation: Secondary | ICD-10-CM | POA: Diagnosis not present

## 2023-11-21 DIAGNOSIS — K582 Mixed irritable bowel syndrome: Secondary | ICD-10-CM

## 2023-11-21 DIAGNOSIS — K5904 Chronic idiopathic constipation: Secondary | ICD-10-CM

## 2023-11-21 MED ORDER — LUBIPROSTONE 8 MCG PO CAPS: 8.0000 ug | ORAL_CAPSULE | Freq: Two times a day (BID) | ORAL | 5 refills | Status: AC

## 2023-11-21 NOTE — Patient Instructions (Signed)
   Start OTC Benefiber Powder. Mix 1 - 2 Tablespoons in 6 - 8 ounces of a Drink Once Daily. Drink 64 ounces of water / fluids Daily.

## 2023-11-26 ENCOUNTER — Ambulatory Visit: Payer: Medicaid Other | Admitting: Obstetrics

## 2023-12-01 ENCOUNTER — Ambulatory Visit: Payer: Self-pay | Admitting: Family Medicine

## 2023-12-01 NOTE — Telephone Encounter (Addendum)
  Chief Complaint: skin concern Symptoms: bruise-like discoloration, pain Frequency: noticed Saturday Pertinent Negatives: Patient denies injuries, fever, CP, SOB Disposition: [] ED /[] Urgent Care (no appt availability in office) / [x] Appointment(In office/virtual)/ []  [facility]/ [] Home Care/ [] Refused Recommended Disposition /[] [facility]/ []  Follow-up with PCP Additional Notes: Pt c/o bruise-like discoloration on L upper thigh that was noticed on Saturday. Denies any injuries, fever, CP, SOB. Endorses painful to touch and gray/purple hue that almost encircles thigh. Denies any discoloration below knee and endorses that it is normal in color without coolness noted in foot. Reports able to bare weight on affected leg/walk. Scheduled patient per protocol on 12/02/2023. Patient verbalized understanding and to call back with worsening symptoms.     Copied from CRM [id number]. Topic: Clinical - Red Word Triage >> Dec 01, 2023 10:07 AM Geroge Baseman wrote: Red Word that prompted transfer to Nurse Triage: Left leg upper thigh area, looks sort of like a sunburn, had spread to her hip area. Is warm to the touch.Ad wide as 4 fingers in some reason Reason for Disposition  [1] Not caused by an injury AND [2] < 5 unexplained bruises  Answer Assessment - Initial Assessment Questions 1. APPEARANCE of BRUISE: "Describe the bruise."      Sunburn like color, gray/Purple-ish (similar to low o2 levels)  Endorses normal color below knee 2. SIZE: "How large is the bruise?"      Large, almost encircling thigh Reports painful 3. NUMBER: "How many bruises are there?"      2 4. LOCATION: "Where is the bruise located?"      Left upper thigh 5. ONSET: "How long ago did the bruise occur?"      Saturday afternoon 6. CAUSE: "Tell me how it happened."     Denies injuries 7. MEDICAL HISTORY: "Do you have any medical problems that can cause easy bruising or bleeding?" (e.g., leukemia, liver disease,  recent chemotherapy)     denies 8. MEDICINES: "Do you take any medications which thin the blood such as: aspirin, heparin, ibuprofen (NSAIDS), Plavix, or Coumadin?"     denies 9. OTHER SYMPTOMS: "Do you have any other symptoms?"  (e.g., weakness, dizziness, pain, fever, nosebleed, blood in urine/stool)     Reports cramping in hamstring area/outside front of thigh since Thursday, reports constant, but able to walk Reports hx of back/hip pain - attributed sx to PMH  Protocols used: [employer]

## 2023-12-02 ENCOUNTER — Ambulatory Visit: Admitting: Family Medicine

## 2023-12-02 ENCOUNTER — Encounter: Payer: Self-pay | Admitting: Family Medicine

## 2023-12-02 VITALS — BP 128/72 | HR 93 | Resp 16 | Ht 63.0 in | Wt 223.0 lb

## 2023-12-02 DIAGNOSIS — S80819A Abrasion, unspecified lower leg, initial encounter: Secondary | ICD-10-CM

## 2023-12-02 DIAGNOSIS — W5503XA Scratched by cat, initial encounter: Secondary | ICD-10-CM

## 2023-12-02 DIAGNOSIS — L03116 Cellulitis of left lower limb: Secondary | ICD-10-CM | POA: Diagnosis not present

## 2023-12-02 MED ORDER — AZITHROMYCIN 250 MG PO TABS: ORAL_TABLET | ORAL | 0 refills | Status: AC

## 2023-12-02 NOTE — Progress Notes (Signed)
 Patient ID: Dana Bradley, female    DOB: 1971-07-15, 53 y.o.   MRN: [id number]  PCP: Danelle Berry, PA-C  Chief Complaint  Patient presents with   Bleeding/Bruising    Bruise L upper thigh, x4 days. No falls or hit leg- spreading    Subjective:   Dana Bradley is a 53 y.o. female, presents to clinic with CC of the following:  HPI  Redness and tenderness to skin on left thigh, she first noticed it Saturday about 3 d ago Its slightly swollen, seems to have spread around her leg and its sore to the touch She had no injury that she can remember She does have some scratched from her cats lower on her leg A few days before the skin sx started she had some low back pain and muscle spasms around the same area She's never had anything like this before  Patient Active Problem List   Diagnosis Date Noted   Pharmacologic therapy 03/12/2023   Chronic use of opiate for therapeutic purpose 03/12/2023   Abnormal CT of the abdomen 11/13/2022   Adenomatous polyp of colon 11/13/2022   Herniated nucleus pulposus, L3-4 (Right) 10/29/2022   Lumbar radiculopathy 10/21/2022   Dyspepsia 09/26/2022   Blood in stool 09/26/2022   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HCC) 04/04/2022   Neurogenic bladder 01/28/2022   Urinary and fecal incontinence 01/28/2022   New onset type 2 diabetes mellitus (HCC) 01/02/2022   Excessive daytime sleepiness 12/14/2021   Lateral epicondylitis, left elbow 07/19/2021   Abnormal MRI, cervical spine (02/14/2021) 02/28/2021   Cervicalgia 02/13/2021   Abnormal MRI, lumbar spine (12/27/2021) 11/09/2020   Chronic midline thoracic back pain 11/09/2020   Abnormal MRI, thoracic spine (08/02/2019) 11/09/2020   Thoracic spinal stenosis (T7-8) 11/09/2020   Prolapse of thoracic disc with radiculopathy (T7-8) 11/09/2020   Spasm of muscle of lower back 11/09/2020   Vertigo, benign paroxysmal, unspecified laterality 11/09/2020   Uncomplicated opioid dependence (HCC) 11/08/2020   Hyperalgesia  03/07/2020   Neurogenic urinary incontinence 03/01/2020   Other intervertebral disc degeneration, lumbar region 03/01/2020   Spinal stenosis of lumbosacral region 03/01/2020   Colon cancer screening 02/06/2020   Lumbar radiculitis (Right) 09/21/2019   Chronic lower extremity pain (Bilateral) 09/06/2019   Intractable migraine with aura without status migrainosus 08/10/2019   Other specified dorsopathies, sacral and sacrococcygeal region 08/03/2019   Latex precautions, history of latex allergy 08/03/2019   History of allergy to radiographic contrast media 08/03/2019   DDD (degenerative disc disease), cervical 07/21/2019   Cervical facet syndrome (Bilateral) (L>R) 07/21/2019   DDD (degenerative disc disease), thoracic 07/21/2019   Osteoarthritis of hip (Left) 07/21/2019   Chronic groin pain (Bilateral) (L>R) 07/21/2019   Chronic hip pain (Bilateral) (L>R) 07/21/2019   Somatic dysfunction of sacroiliac joint (Bilateral) 07/21/2019   Migraine with aura and with status migrainosus, not intractable 04/06/2019   Cervico-occipital neuralgia (Left) 04/06/2019   Weakness of leg (Left) 04/05/2019   Difficulty walking 04/05/2019   Chronic migraine without aura, with intractable migraine, so stated, with status migrainosus 12/27/2018   Malar rash 09/04/2018   Trigger point of neck (Left) 03/19/2018   Occipital headache 12/25/2017   Chronic fatigue syndrome with fibromyalgia 12/12/2017   Trigger point of shoulder region (Left) 11/17/2017   Myofascial pain syndrome (Left) (trapezius muscle) 07/22/2017   Lumbar L1-2 disc protrusion (Right) 04/07/2017   Muscle spasticity 04/01/2017   Osteoarthritis of shoulder (Bilateral) 04/01/2017   Lumbar spondylosis 01/06/2017   Chronic hip pain (Left) 12/24/2016  Chronic sacroiliac joint pain (Left) 12/24/2016   Lumbar facet syndrome (Bilateral) (L>R) 12/24/2016   Lumbar radiculitis (Left) 12/24/2016   Hypertriglyceridemia 11/27/2016   History of vasovagal  episode 10/30/2016   Cervicogenic headache 09/09/2016   Medication monitoring encounter 08/29/2016   Controlled substance agreement signed 08/28/2016   Plantar fasciitis of left foot 08/28/2016   Vitamin B12 deficiency 08/28/2016   Hyperlipidemia 08/28/2016   Nephrolithiasis 08/12/2016   Chronic pain syndrome 08/07/2016   Long term current use of opiate analgesic 08/07/2016   Long term prescription opiate use 08/07/2016   Opiate use 08/07/2016   Long term prescription benzodiazepine use 08/07/2016   Neurogenic pain 08/07/2016   Chronic low back pain (1ry area of Pain) (Bilateral) (R>L) (midline) w/ sciatica (Bilateral) 08/07/2016   Chronic upper back pain (2ry area of Pain) (Bilateral) (L>R) 08/07/2016   Chronic abdominal pain (Right lower quadrant) 08/07/2016   Thoracic radiculitis (Bilateral: T10, T11) 08/07/2016   Chronic occipital neuralgia (3ry area of Pain) (Bilateral) (L>R) 08/07/2016   Chronic neck pain 08/07/2016   Chronic cervical radicular pain (Bilateral) (L>R) 08/07/2016   Chronic shoulder blade pain (Bilateral) (L>R) 08/07/2016   Chronic upper extremity pain (Bilateral) (R>L) 08/07/2016   Chronic knee pain (Bilateral) (R>L) 08/07/2016   Chronic ankle pain (Bilateral) 08/07/2016   Cervical spondylosis with myelopathy and radiculopathy 08/07/2016   Panic disorder with agoraphobia 05/29/2016   Depression, unspecified depression type 05/29/2016   Atypical lymphocytosis 05/01/2016   Vitamin D deficiency 05/01/2016   Chronic lower extremity cramps (Bilateral) (R>L) 04/29/2016   Obesity, Class III, BMI 40-49.9 (morbid obesity) (HCC) 04/29/2016   GAD (generalized anxiety disorder) 04/29/2016   Fatigue 04/29/2016   Insomnia 07/12/2015   Migraine without aura and with status migrainosus, not intractable 07/12/2015   Chronic superficial gastritis 06/02/2015   Chronic pain of multiple joints 05/15/2015   Bilateral leg edema 05/02/2015   Paroxysmal supraventricular tachycardia  (HCC) 04/17/2015   Exertional shortness of breath 04/17/2015   Bright red rectal bleeding 04/06/2015   DDD (degenerative disc disease), lumbosacral 01/24/2014   DDD (degenerative disc disease), lumbar 01/24/2014   Cervico-occipital neuralgia 12/29/2013   Fibromyalgia 12/29/2013   Migraine headache 12/29/2013   Menorrhagia 12/10/2012   Depression, major, recurrent, in remission (HCC) 01/12/2009   Chest pain 01/12/2009   Hypertension, benign essential, goal below 140/90 06/23/2008   History of PSVT (paroxysmal supraventricular tachycardia) 06/17/2008   Obstructive sleep apnea 06/17/2008   GERD 06/13/2008      Current Outpatient Medications:    amitriptyline (ELAVIL) 150 MG tablet, Take 1 tablet (150 mg total) by mouth at bedtime., Disp: 90 tablet, Rfl: 3   Atogepant (QULIPTA) 30 MG TABS, Take 1 tablet (30 mg total) by mouth daily as needed., Disp: 30 tablet, Rfl: 3   atorvastatin (LIPITOR) 40 MG tablet, TAKE ONE TABLET BY MOUTH EVERY NIGHT AT BEDTIME, Disp: 90 tablet, Rfl: 3   baclofen (LIORESAL) 10 MG tablet, Take 0.5-1 tablets (5-10 mg total) by mouth 3 (three) times daily. Each refill must last 30 days., Disp: 270 tablet, Rfl: 1   butalbital-acetaminophen-caffeine (BAC) 50-325-40 MG tablet, Take 1 tablet by mouth every 4 (four) hours as needed for migraine., Disp: 60 tablet, Rfl: 3   diclofenac Sodium (VOLTAREN) 1 % GEL, Apply topically., Disp: , Rfl:    dicyclomine (BENTYL) 10 MG capsule, Take by mouth., Disp: , Rfl:    lubiprostone (AMITIZA) 8 MCG capsule, Take 1 capsule (8 mcg total) by mouth 2 (two) times daily with a meal.,  Disp: 60 capsule, Rfl: 5   meclizine (ANTIVERT) 12.5 MG tablet, Take 1 tablet (12.5 mg total) by mouth 3 (three) times daily as needed for dizziness., Disp: 30 tablet, Rfl: 0   medroxyPROGESTERone (PROVERA) 5 MG tablet, Take 1 tablet (5 mg total) by mouth daily., Disp: 90 tablet, Rfl: 3   metoprolol succinate (TOPROL-XL) 100 MG 24 hr tablet, TAKE ONE TABLET  (100 MG TOTAL) BY MOUTH DAILY. TAKE WITH OR IMMEDIATELY FOLLOWING A MEAL., Disp: 90 tablet, Rfl: 0   Needles & Syringes MISC, For administration of B12 injections, Disp: 30 each, Rfl: 0   ondansetron (ZOFRAN) 4 MG tablet, Take 1 tablet (4 mg total) by mouth every 8 (eight) hours as needed., Disp: 20 tablet, Rfl: 2   polyethylene glycol-electrolytes (NULYTELY) 420 g solution, Drink one 8 oz glass of mixture every 15 minutes until you finish all of the jug., Disp: 4000 mL, Rfl: 0   pregabalin (LYRICA) 150 MG capsule, Take 1 capsule (150 mg total) by mouth 3 (three) times daily as needed., Disp: 270 capsule, Rfl: 3   rizatriptan (MAXALT) 10 MG tablet, Take 1 tablet (10 mg total) by mouth as needed for migraine. May repeat in 2 hours if needed, Disp: 10 tablet, Rfl: 3   scopolamine (TRANSDERM-SCOP) 1 MG/3DAYS, Place 1 patch (1.5 mg total) onto the skin every 3 (three) days., Disp: 10 patch, Rfl: 12   triamcinolone cream (KENALOG) 0.1 %, Apply 1 Application topically 2 (two) times daily as needed (itch/rash). Do not apply to open skin., Disp: 30 g, Rfl: 0   HYDROcodone-acetaminophen (NORCO) 7.5-325 MG tablet, Take 1 tablet by mouth 2 (two) times daily as needed for severe pain (pain score 7-10). Must last 30 days, Disp: 25 tablet, Rfl: 0   Allergies  Allergen Reactions   Aspirin Swelling   Cephalexin Rash   Cymbalta [Duloxetine Hcl] Other (See Comments)    Suicidal ideations and has homicidal thoughts per patient   Depakote [Divalproex Sodium] Shortness Of Breath    W/ n/v   Gadolinium Derivatives     Pt was unable to breath Other reaction(s): Other (See Comments) Pt was unable to breath   Haloperidol Shortness Of Breath    W/ n/v   Meperidine Nausea And Vomiting    Patient projectile vomits and usually result in ER Other reaction(s): Vomiting   Metoclopramide Shortness Of Breath    Can't breath, wheezes  Other reaction(s): Other (See Comments)  Can't breath, wheezes Other reaction(s):  Other (See Comments)   Morphine     Other reaction(s): Vomiting   Penicillins Rash    Other reaction(s): Unknown   Prochlorperazine Other (See Comments)    Panic attack Other reaction(s): Other (See Comments)   Tramadol Hcl Palpitations    Severely and adversely affects her SVT giving her tachycardias of 150-160 bpm.   Trazodone Shortness Of Breath    Other reaction(s): Other (See Comments)   Meloxicam Other (See Comments)    mouth sores, tingling, blisters in mouth Other reaction(s): Other (See Comments) mout sores, tingling mouth sores, tingling, blisters in mouth   Neomycin-Bacitracin Zn-Polymyx Rash   Tomato Hives    Tongue will blister   Egg-Derived Products Other (See Comments)    inflammation   Milk-Related Compounds Other (See Comments)    inflammation   Other     Other reaction(s): Other (See Comments)   Shellfish Allergy     Other reaction(s): Unknown   Shellfish-Derived Products Other (See Comments)    Other reaction(s):  Unknown Other reaction(s): Unknown   Bacitra-Neomycin-Polymyxin-Hc Rash   Bacitracin-Neomycin-Polymyxin Rash   Cephalosporins Rash    rash   Ibuprofen Other (See Comments) and Rash    Blisters in mouth. Blisters in mouth.   Latex Itching    Other reaction(s): Unknown   Nsaids Other (See Comments)    Blisters in mouth; can take 1 ibuprofen 2x a month Other reaction(s): Other (See Comments), Unknown Blisters in mouth; can take 1 ibuprofen 2x a month   Sulfa Antibiotics Rash    rash Other reaction(s): Unknown rash  Other reaction(s): Unknown rash rash Other reaction(s): Unknown rash   Sulfonamide Derivatives Rash     Social History   Tobacco Use   Smoking status: Former    Current packs/day: 0.00    Average packs/day: 2.0 packs/day for 3.0 years (6.0 ttl pk-yrs)    Types: Cigarettes    Start date: 12/10/1989    Quit date: 12/10/1992    Years since quitting: 30.9   Smokeless tobacco: Never   Tobacco comments:    quit 25 years  ago  Vaping Use   Vaping status: Never Used  Substance Use Topics   Alcohol use: No    Comment: socially   Drug use: No      Chart Review Today: I personally reviewed active problem list, medication list, allergies, family history, social history, health maintenance, notes from last encounter, lab results, imaging with the patient/caregiver today.   Review of Systems  Constitutional: Negative.  Negative for chills, diaphoresis, fatigue and fever.  HENT: Negative.    Eyes: Negative.   Respiratory: Negative.    Cardiovascular: Negative.   Gastrointestinal: Negative.   Endocrine: Negative.   Genitourinary: Negative.   Musculoskeletal: Negative.   Skin: Negative.   Allergic/Immunologic: Negative.   Neurological: Negative.   Hematological: Negative.  Negative for adenopathy. Does not bruise/bleed easily.  Psychiatric/Behavioral: Negative.    All other systems reviewed and are negative.      Objective:   Vitals:   12/02/23 0841  BP: 128/72  Pulse: 93  Resp: 16  SpO2: 98%  Weight: 223 lb (101.2 kg)  Height: 5\' 3"  (1.6 m)    Body mass index is 39.5 kg/m.  Physical Exam Vitals and nursing note reviewed.  Constitutional:      General: She is not in acute distress.    Appearance: Normal appearance. She is well-developed. She is obese. She is not ill-appearing, toxic-appearing or diaphoretic.  HENT:     Head: Normocephalic and atraumatic.     Nose: Nose normal.  Eyes:     General:        Right eye: No discharge.        Left eye: No discharge.     Conjunctiva/sclera: Conjunctivae normal.  Neck:     Trachea: No tracheal deviation.  Cardiovascular:     Rate and Rhythm: Normal rate and regular rhythm.  Pulmonary:     Effort: Pulmonary effort is normal. No respiratory distress.     Breath sounds: No stridor.  Skin:    General: Skin is warm and dry.     Findings: Erythema present. No rash.  Neurological:     Mental Status: She is alert.     Motor: No abnormal  muscle tone.     Coordination: Coordination normal.     Gait: Gait abnormal.  Psychiatric:        Behavior: Behavior normal.      Results for orders placed or performed in visit on  11/10/23  POCT Glucose (CBG)   Collection Time: 11/10/23  4:08 PM  Result Value Ref Range   POC Glucose 127 (A) 70 - 99 mg/dl   *Note: Due to a large number of results and/or encounters for the requested time period, some results have not been displayed. A complete set of results can be found in Results Review.       Assessment & Plan:   1. Cellulitis of left lower extremity (Primary) Slightly swollen and erythematous areas of upper left thigh, very tender to palpation, but skin does not feel like typical cellulitis - it is not indurated and it is minimally swollen.  The tenderness seems slightly out of proportion with exam.  Since she has visible scratches from her cat we will tx azithromycin to tx, and follow her closely - azithromycin (ZITHROMAX) 250 MG tablet; Take 2 tablets on day 1, then 1 tablet daily on days 2 through 5  Dispense: 6 tablet; Refill: 0  2. Cat scratch of lower leg, initial encounter  - azithromycin (ZITHROMAX) 250 MG tablet; Take 2 tablets on day 1, then 1 tablet daily on days 2 through 5  Dispense: 6 tablet; Refill: 0   Return for recheck 1 week .    Danelle Berry, PA-C 12/02/23 9:00 AM

## 2023-12-04 ENCOUNTER — Ambulatory Visit: Payer: Self-pay | Admitting: Family Medicine

## 2023-12-04 ENCOUNTER — Other Ambulatory Visit: Payer: Self-pay

## 2023-12-04 ENCOUNTER — Emergency Department
Admission: EM | Admit: 2023-12-04 | Discharge: 2023-12-04 | Disposition: A | Discharge status: Home / Self Care | Attending: Emergency Medicine | Admitting: Emergency Medicine

## 2023-12-04 DIAGNOSIS — R7402 Elevation of levels of lactic acid dehydrogenase (LDH): Secondary | ICD-10-CM | POA: Diagnosis not present

## 2023-12-04 DIAGNOSIS — L03116 Cellulitis of left lower limb: Secondary | ICD-10-CM | POA: Insufficient documentation

## 2023-12-04 DIAGNOSIS — E1165 Type 2 diabetes mellitus with hyperglycemia: Secondary | ICD-10-CM | POA: Diagnosis not present

## 2023-12-04 DIAGNOSIS — R739 Hyperglycemia, unspecified: Secondary | ICD-10-CM | POA: Diagnosis present

## 2023-12-04 LAB — URINALYSIS, ROUTINE W REFLEX MICROSCOPIC
Bacteria, UA: NONE SEEN
Bilirubin Urine: NEGATIVE
Glucose, UA: >=500 mg/dL — AB
Ketones, ur: NEGATIVE mg/dL
Leukocytes,Ua: NEGATIVE
Nitrite: NEGATIVE
Protein, ur: NEGATIVE mg/dL
Specific Gravity, Urine: 1.023 (ref 1.005–1.030)
pH: 5 (ref 5.0–8.0)

## 2023-12-04 LAB — BASIC METABOLIC PANEL
Anion gap: 14 (ref 5–15)
BUN: 9 mg/dL (ref 6–20)
CO2: 23 mmol/L (ref 22–32)
Calcium: 9.8 mg/dL (ref 8.9–10.3)
Chloride: 97 mmol/L — ABNORMAL LOW (ref 98–111)
Creatinine, Ser: 0.76 mg/dL (ref 0.44–1.00)
GFR, Estimated: >60 mL/min (ref >60)
Glucose, Bld: 508 mg/dL (ref 70–99)
Potassium: 4 mmol/L (ref 3.5–5.1)
Sodium: 134 mmol/L — ABNORMAL LOW (ref 135–145)

## 2023-12-04 LAB — CBG MONITORING, ED
Glucose-Capillary: 538 mg/dL (ref 70–99)
Glucose-Capillary: 375 mg/dL — ABNORMAL HIGH (ref 70–99)
Glucose-Capillary: 250 mg/dL — ABNORMAL HIGH (ref 70–99)

## 2023-12-04 LAB — CBC
HCT: 38.4 % (ref 36.0–46.0)
Hemoglobin: 12.8 g/dL (ref 12.0–15.0)
MCH: 31.9 pg (ref 26.0–34.0)
MCHC: 33.3 g/dL (ref 30.0–36.0)
MCV: 95.8 fL (ref 80.0–100.0)
Platelets: 232 10*3/uL (ref 150–400)
RBC: 4.01 MIL/uL (ref 3.87–5.11)
RDW: 13.5 % (ref 11.5–15.5)
WBC: 5.9 10*3/uL (ref 4.0–10.5)
nRBC: 0 % (ref 0.0–0.2)

## 2023-12-04 LAB — LACTIC ACID, PLASMA
Lactic Acid, Venous: 3.1 mmol/L (ref 0.5–1.9)
Lactic Acid, Venous: 2.4 mmol/L (ref 0.5–1.9)

## 2023-12-04 LAB — PREGNANCY, URINE: Preg Test, Ur: NEGATIVE

## 2023-12-04 MED ORDER — DOXYCYCLINE HYCLATE 100 MG PO CAPS: 100.0000 mg | ORAL_CAPSULE | Freq: Two times a day (BID) | ORAL | 0 refills | Status: DC

## 2023-12-04 MED ORDER — SODIUM CHLORIDE 0.9 % IV BOLUS
1000.0000 mL | Freq: Once | INTRAVENOUS | Status: AC
  Administered 2023-12-04: 1000 mL via INTRAVENOUS

## 2023-12-04 NOTE — ED Triage Notes (Signed)
 Pt here with hyperglycemia, states her son got her the wrong soda and her sugar began to be elevated, 553. Pt is very tired, thirsty, and slightly confused. Pt also c/o of a left leg infx that is red and painful.

## 2023-12-04 NOTE — ED Notes (Signed)
 Date and time results received: 12/04/23 1542 Test: Lactic Acid Critical Value: 3.1  Name of Provider Notified: MD Fanny Bien

## 2023-12-04 NOTE — Telephone Encounter (Signed)
  Chief Complaint: blood sugar 532  Symptoms: fatigue, blurry vision worse than normal thirst, can stand but feels very weak. Did eat lunch at 1230 and recently drank regular soda.  Frequency: now  Pertinent Negatives: Patient denies difficulty breathing no confusion no dizziness  Disposition: [x] ED /[] Urgent Care (no appt availability in office) / [] Appointment(In office/virtual)/ []  [place]/ [] Home Care/ [] Refused Recommended Disposition /[] [place] [facility]/ []  Follow-up with PCP Additional Notes:   Recommended ED now or call 911.       Copied from CRM [id number]. Topic: Clinical - Red Word Triage >> Dec 04, 2023  2:15 PM Dana Bradley wrote: Red Word that prompted transfer to Nurse Triage: blood sugar 532 Reason for Disposition  Blood glucose > 500 mg/dL (04.5 mmol/L)  Answer Assessment - Initial Assessment Questions 1. BLOOD GLUCOSE: "What is your blood glucose level?"      532  2. ONSET: "When did you check the blood glucose?"     Now  3. USUAL RANGE: "What is your glucose level usually?" (e.g., usual fasting morning value, usual evening value)     Not sure  4. KETONES: "Do you check for ketones (urine or blood test strips)?" If Yes, ask: "What does the test show now?"      na 5. TYPE 1 or 2:  "Do you know what type of diabetes you have?"  (e.g., Type 1, Type 2, Gestational; doesn't know)      Na  6. INSULIN: "Do you take insulin?" "What type of insulin(s) do you use? What is the mode of delivery? (syringe, pen; injection or pump)?"      no 7. DIABETES PILLS: "Do you take any pills for your diabetes?" If Yes, ask: "Have you missed taking any pills recently?"     Yes metformin 8. OTHER SYMPTOMS: "Do you have any symptoms?" (e.g., fever, frequent urination, difficulty breathing, dizziness, weakness, vomiting)     Tired thirst , blurry vision worse. Feels very weak can stand but weak 9. PREGNANCY: "Is there any chance you are pregnant?" "When was your last  menstrual period?"     na  Protocols used: Diabetes - High Blood Sugar-A-AH

## 2023-12-04 NOTE — ED Provider Notes (Signed)
 [facility] Provider Note    Event Date/Time   First MD Initiated Contact with Patient 12/04/23 1535     (approximate)   History   Hyperglycemia   HPI  Dana Bradley is a 53 y.o. female   with a history of chronic orthopedic issues, chronic low back pain, diabetes controlled with metformin.  Patient noticed about 3 days ago some redness over her left anterior thigh.  She saw her doctor yesterday and started taking azithromycin today.  She advises she was started on azithromycin, she has 4 cats and thinks possibly 1 could have scratched her though she never saw any open wound or sore.  There is no bubbling or pus drainage.  The area is red and has persisted for the last couple of days.  She noticed today that her blood sugar was reading around 500 at home.  She also advises that she had increased her appetite with increased carbohydrates due to the recent loss of her mother, dietary changes, and also has been drinking nondiet soda which will elevate her sugar  No fevers or chills no nausea or vomiting.  Overall she feels well except she does feel little bit dry, like she is urinating more frequently        Physical Exam   Triage Vital Signs: ED Triage Vitals  Encounter Vitals Group     BP 12/04/23 1505 100/81     Systolic BP Percentile --      Diastolic BP Percentile --      Pulse Rate 12/04/23 1505 (!) 121     Resp 12/04/23 1505 17     Temp 12/04/23 1505 98.4 F (36.9 C)     Temp src --      SpO2 12/04/23 1505 100 %     Weight 12/04/23 1507 223 lb 1.7 oz (101.2 kg)     Height 12/04/23 1507 5\' 3"  (1.6 m)     Head Circumference --      Peak Flow --      Pain Score 12/04/23 1507 4     Pain Loc --      Pain Education --      Exclude from Growth Chart --     Most recent vital signs: Vitals:   12/04/23 1548 12/04/23 1600  BP:  120/79  Pulse:  (!) 116  Resp:  18  Temp:    SpO2: 99% 99%     General: Awake, no distress.  Very pleasant  accompanied by daughter CV:  Good peripheral perfusion.  Normal tone slight tachycardia.  Of note the patient reports that her heart rate is often greater than 100 Resp:  Normal effort.  Clear lungs bilaterally Abd:  No distention.  Soft nontender nondistended Other:  The left anterior and posterior mid thigh has an area of approximately 3 cm wide in a somewhat bandlike pattern of erythema and warmth.  There is no induration there is no purulent drainage.  There is no central skin lesions noted.  It is warm to the touch and blanches.  There is no vesicles.  Appears very similar nature to the manage taken by her primary care yesterday.  There is no noted lymphatic spread, does not appear to demonstrate superior-inferior spread of erythema or lymph node tenderness.   ED Results / Procedures / Treatments   Labs (all labs ordered are listed, but only abnormal results are displayed) Labs Reviewed  BASIC METABOLIC PANEL - Abnormal; Notable for the following components:  Result Value   Sodium 134 (*)    Chloride 97 (*)    Glucose, Bld 508 (*)    All other components within normal limits  URINALYSIS, ROUTINE W REFLEX MICROSCOPIC - Abnormal; Notable for the following components:   Color, Urine STRAW (*)    APPearance CLEAR (*)    Glucose, UA >=500 (*)    Hgb urine dipstick SMALL (*)    All other components within normal limits  LACTIC ACID, PLASMA - Abnormal; Notable for the following components:   Lactic Acid, Venous 3.1 (*)    All other components within normal limits  LACTIC ACID, PLASMA - Abnormal; Notable for the following components:   Lactic Acid, Venous 2.4 (*)    All other components within normal limits  CBG MONITORING, ED - Abnormal; Notable for the following components:   Glucose-Capillary 538 (*)    All other components within normal limits  CBG MONITORING, ED - Abnormal; Notable for the following components:   Glucose-Capillary 375 (*)    All other components within normal  limits  CBG MONITORING, ED - Abnormal; Notable for the following components:   Glucose-Capillary 250 (*)    All other components within normal limits  CULTURE, BLOOD (ROUTINE X 2)  CULTURE, BLOOD (ROUTINE X 2)  CBC  PREGNANCY, URINE   Patient's glucose elevated but normal CO2 normal anion gap no evidence of suggest DKA or HHS noted.  Mental status normal  EKG     RADIOLOGY     PROCEDURES:  Critical Care performed: No  Procedures   MEDICATIONS ORDERED IN ED: Medications  sodium chloride 0.9 % bolus 1,000 mL (0 mLs Intravenous Stopped 12/04/23 1628)  sodium chloride 0.9 % bolus 1,000 mL (0 mLs Intravenous Stopped 12/04/23 1811)     IMPRESSION / MDM / ASSESSMENT AND PLAN / ED COURSE  I reviewed the triage vital signs and the nursing notes.                              Differential diagnosis includes, but is not limited to, cellulitis, uncontrolled diabetes, increased caloric or carbohydrate intake, hyperglycemia with associated polyuria polydipsia is suspected, cat scratch, cellulitis etc.  She presents with tachycardia and has mildly elevated lactic acid.  However she also appears slightly dry by clinical history, after receiving 2 L of fluid her blood sugars improved to 250.  She is ambulatory in no distress fully alert oriented.  After receiving fluids she has very mild ongoing tachycardia but also review of primary care notes demonstrate frequently heart rate between 90-100 in the clinic as well.  Patient very nontoxic-appearing at this time  She is afebrile with normal white count.  Discussed with the patient, she is very comfortable with starting doxycycline for additional staph and strep coverage and will continue her azithromycin as well.  Azithromycin for coverage of possible cat scratch related illness but I think given her clinical setting is also prudent to cover for potential skin flora and common cause of cellulitis.  There has been outlined she will monitor  closely continue to track her blood sugar and plans to return back to her normal diet with less sugar sodas etc. and follow-up close with PCP  Return precautions and treatment recommendations and follow-up discussed with the patient who is agreeable with the plan.   Patient's presentation is most consistent with acute complicated illness / injury requiring diagnostic workup.  FINAL CLINICAL IMPRESSION(S) / ED DIAGNOSES   Final diagnoses:  Cellulitis of left leg without foot  Type 2 diabetes mellitus with hyperglycemia, without long-term current use of insulin (HCC)     Rx / DC Orders   ED Discharge Orders          Ordered    doxycycline (VIBRAMYCIN) 100 MG capsule  2 times daily        12/04/23 1856             Note:  This document was prepared using Dragon voice recognition software and may include unintentional dictation errors.   Sharyn Creamer, MD 12/04/23 Mikle Bosworth

## 2023-12-04 NOTE — ED Notes (Signed)
 Date and time results received: 12/04/23 1542 Test: Glucose Critical Value: 508  Name of Provider Notified: MD Fanny Bien

## 2023-12-04 NOTE — Discharge Instructions (Addendum)
 Please complete your antibiotics as prescribed.  Monitor the areas of redness on your left leg as we have outlined them.  Return to the ER if you begin having fever increasing weakness chills nausea vomiting, confusion or persistently elevated blood sugar such as greater than 300 or other concerns arise.  Continue to take your metformin and follow-up closely with your doctor hopefully tomorrow or Monday.

## 2023-12-05 ENCOUNTER — Ambulatory Visit: Payer: Medicaid Other | Admitting: Family Medicine

## 2023-12-09 ENCOUNTER — Encounter: Payer: Self-pay | Admitting: Family Medicine

## 2023-12-09 ENCOUNTER — Telehealth: Payer: Self-pay | Admitting: Family Medicine

## 2023-12-09 ENCOUNTER — Ambulatory Visit

## 2023-12-09 ENCOUNTER — Ambulatory Visit: Payer: Medicaid Other

## 2023-12-09 ENCOUNTER — Ambulatory Visit: Admitting: Family Medicine

## 2023-12-09 VITALS — BP 120/74 | HR 119 | Resp 16 | Ht 63.0 in | Wt 216.0 lb

## 2023-12-09 DIAGNOSIS — N939 Abnormal uterine and vaginal bleeding, unspecified: Secondary | ICD-10-CM

## 2023-12-09 DIAGNOSIS — Z7984 Long term (current) use of oral hypoglycemic drugs: Secondary | ICD-10-CM

## 2023-12-09 DIAGNOSIS — R000 Tachycardia, unspecified: Secondary | ICD-10-CM

## 2023-12-09 DIAGNOSIS — R739 Hyperglycemia, unspecified: Secondary | ICD-10-CM

## 2023-12-09 DIAGNOSIS — L03116 Cellulitis of left lower limb: Secondary | ICD-10-CM

## 2023-12-09 DIAGNOSIS — S80819A Abrasion, unspecified lower leg, initial encounter: Secondary | ICD-10-CM

## 2023-12-09 DIAGNOSIS — S80812D Abrasion, left lower leg, subsequent encounter: Secondary | ICD-10-CM | POA: Diagnosis not present

## 2023-12-09 DIAGNOSIS — W5503XD Scratched by cat, subsequent encounter: Secondary | ICD-10-CM

## 2023-12-09 DIAGNOSIS — E119 Type 2 diabetes mellitus without complications: Secondary | ICD-10-CM

## 2023-12-09 DIAGNOSIS — E1169 Type 2 diabetes mellitus with other specified complication: Secondary | ICD-10-CM

## 2023-12-09 DIAGNOSIS — Z09 Encounter for follow-up examination after completed treatment for conditions other than malignant neoplasm: Secondary | ICD-10-CM

## 2023-12-09 DIAGNOSIS — R600 Localized edema: Secondary | ICD-10-CM | POA: Diagnosis not present

## 2023-12-09 LAB — CULTURE, BLOOD (ROUTINE X 2)
Culture: NO GROWTH
Culture: NO GROWTH
Special Requests: ADEQUATE

## 2023-12-09 MED ORDER — OZEMPIC (0.25 OR 0.5 MG/DOSE) 2 MG/3ML ~~LOC~~ SOPN: PEN_INJECTOR | SUBCUTANEOUS | 0 refills | Status: DC

## 2023-12-09 MED ORDER — METOPROLOL SUCCINATE ER 100 MG PO TB24: 150.0000 mg | ORAL_TABLET | Freq: Every day | ORAL | 0 refills | Status: DC

## 2023-12-09 MED ORDER — METFORMIN HCL 1000 MG PO TABS: 1000.0000 mg | ORAL_TABLET | Freq: Two times a day (BID) | ORAL | 3 refills | Status: AC

## 2023-12-09 MED ORDER — FREESTYLE LIBRE 3 READER DEVI: 0 refills | Status: AC

## 2023-12-09 MED ORDER — FREESTYLE LIBRE 3 SENSOR MISC: 1.0000 | 11 refills | Status: DC

## 2023-12-09 NOTE — Telephone Encounter (Signed)
 Prior Authorization from [employer] or 0.5MG /DOS, (OZEMPIC, 0.25 OR 0.5 MG/DOSE,) 2 MG/3ML SOPN   Key: [id number]

## 2023-12-09 NOTE — Progress Notes (Signed)
 Patient ID: Dana Bradley, female    DOB: [birth year], 53 y.o.   MRN: [id number]  PCP: Danelle Berry, PA-C  Chief Complaint  Patient presents with   Follow-up    1 week, cellulitis L leg    Subjective:   Dana Bradley is a 53 y.o. female, presents to clinic with CC of the following:  HPI  Pt presents for f/up on leg swelling and pain, unfortunately since OV she did present to the ED for same and hyperglycemia ER added doxycycline to azithromycin and hyperglycemia responded to IVF ED encounter reviewed         Component Ref Range & Units 5 d ago 12/04/23 5 d ago 12/04/23 5 d ago 12/04/23  Glucose-Capillary 70 - 99 mg/dL 841 324MW 102VO   She had + lactic acid that improved while in the ED  DM recent dx and A1c has been 6.8-7.5 Managed on metformin 1000 mg BID, but someone discontinued meds last month (CMA in med list w/o other info? D/c at GI appt, not in GI PA note - just d/c on chart) Lab Results  Component Value Date   HGBA1C 7.0 (H) 09/19/2023   Tachycardia  120 rechecked manually 120-132 today Pulse Readings from Last 15 Encounters:  12/09/23 (!) 119  12/04/23 (!) 105  12/02/23 93  11/21/23 (!) 120  11/10/23 86  10/09/23 (!) 107  10/06/23 99  09/16/23 (!) 108  08/01/23 (!) 110  06/30/23 (!) 104  06/23/23 (!) 106  06/23/23 99  05/20/23 97  05/12/23 99  04/15/23 (!) 105   With ambulation HR 120-132, pulse ox lowest 92%, 92-96%  LEE some minor improvement with zpak but she did not start the doxy from the ED Leg pain persists, erythema and edema is near the marked area outlined when pt presented to the ED        Patient Active Problem List   Diagnosis Date Noted   Pharmacologic therapy 03/12/2023   Chronic use of opiate for therapeutic purpose 03/12/2023   Abnormal CT of the abdomen 11/13/2022   Adenomatous polyp of colon 11/13/2022   Herniated nucleus pulposus, L3-4 (Right) 10/29/2022   Lumbar radiculopathy 10/21/2022   Dyspepsia 09/26/2022   Blood in  stool 09/26/2022   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HCC) 04/04/2022   Neurogenic bladder 01/28/2022   Urinary and fecal incontinence 01/28/2022   New onset type 2 diabetes mellitus (HCC) 01/02/2022   Excessive daytime sleepiness 12/14/2021   Lateral epicondylitis, left elbow 07/19/2021   Abnormal MRI, cervical spine (02/14/2021) 02/28/2021   Cervicalgia 02/13/2021   Abnormal MRI, lumbar spine (12/27/2021) 11/09/2020   Chronic midline thoracic back pain 11/09/2020   Abnormal MRI, thoracic spine (08/02/2019) 11/09/2020   Thoracic spinal stenosis (T7-8) 11/09/2020   Prolapse of thoracic disc with radiculopathy (T7-8) 11/09/2020   Spasm of muscle of lower back 11/09/2020   Vertigo, benign paroxysmal, unspecified laterality 11/09/2020   Uncomplicated opioid dependence (HCC) 11/08/2020   Hyperalgesia 03/07/2020   Neurogenic urinary incontinence 03/01/2020   Other intervertebral disc degeneration, lumbar region 03/01/2020   Spinal stenosis of lumbosacral region 03/01/2020   Colon cancer screening 02/06/2020   Lumbar radiculitis (Right) 09/21/2019   Chronic lower extremity pain (Bilateral) 09/06/2019   Intractable migraine with aura without status migrainosus 08/10/2019   Other specified dorsopathies, sacral and sacrococcygeal region 08/03/2019   Latex precautions, history of latex allergy 08/03/2019   History of allergy to radiographic contrast media 08/03/2019   DDD (degenerative disc disease), cervical 07/21/2019  Cervical facet syndrome (Bilateral) (L>R) 07/21/2019   DDD (degenerative disc disease), thoracic 07/21/2019   Osteoarthritis of hip (Left) 07/21/2019   Chronic groin pain (Bilateral) (L>R) 07/21/2019   Chronic hip pain (Bilateral) (L>R) 07/21/2019   Somatic dysfunction of sacroiliac joint (Bilateral) 07/21/2019   Migraine with aura and with status migrainosus, not intractable 04/06/2019   Cervico-occipital neuralgia (Left) 04/06/2019   Weakness of leg (Left) 04/05/2019   Difficulty  walking 04/05/2019   Chronic migraine without aura, with intractable migraine, so stated, with status migrainosus 12/27/2018   Malar rash 09/04/2018   Trigger point of neck (Left) 03/19/2018   Occipital headache 12/25/2017   Chronic fatigue syndrome with fibromyalgia 12/12/2017   Trigger point of shoulder region (Left) 11/17/2017   Myofascial pain syndrome (Left) (trapezius muscle) 07/22/2017   Lumbar L1-2 disc protrusion (Right) 04/07/2017   Muscle spasticity 04/01/2017   Osteoarthritis of shoulder (Bilateral) 04/01/2017   Lumbar spondylosis 01/06/2017   Chronic hip pain (Left) 12/24/2016   Chronic sacroiliac joint pain (Left) 12/24/2016   Lumbar facet syndrome (Bilateral) (L>R) 12/24/2016   Lumbar radiculitis (Left) 12/24/2016   Hypertriglyceridemia 11/27/2016   History of vasovagal episode 10/30/2016   Cervicogenic headache 09/09/2016   Medication monitoring encounter 08/29/2016   Controlled substance agreement signed 08/28/2016   Plantar fasciitis of left foot 08/28/2016   Vitamin B12 deficiency 08/28/2016   Hyperlipidemia 08/28/2016   Nephrolithiasis 08/12/2016   Chronic pain syndrome 08/07/2016   Long term current use of opiate analgesic 08/07/2016   Long term prescription opiate use 08/07/2016   Opiate use 08/07/2016   Long term prescription benzodiazepine use 08/07/2016   Neurogenic pain 08/07/2016   Chronic low back pain (1ry area of Pain) (Bilateral) (R>L) (midline) w/ sciatica (Bilateral) 08/07/2016   Chronic upper back pain (2ry area of Pain) (Bilateral) (L>R) 08/07/2016   Chronic abdominal pain (Right lower quadrant) 08/07/2016   Thoracic radiculitis (Bilateral: T10, T11) 08/07/2016   Chronic occipital neuralgia (3ry area of Pain) (Bilateral) (L>R) 08/07/2016   Chronic neck pain 08/07/2016   Chronic cervical radicular pain (Bilateral) (L>R) 08/07/2016   Chronic shoulder blade pain (Bilateral) (L>R) 08/07/2016   Chronic upper extremity pain (Bilateral) (R>L)  08/07/2016   Chronic knee pain (Bilateral) (R>L) 08/07/2016   Chronic ankle pain (Bilateral) 08/07/2016   Cervical spondylosis with myelopathy and radiculopathy 08/07/2016   Panic disorder with agoraphobia 05/29/2016   Depression, unspecified depression type 05/29/2016   Atypical lymphocytosis 05/01/2016   Vitamin D deficiency 05/01/2016   Chronic lower extremity cramps (Bilateral) (R>L) 04/29/2016   Obesity, Class III, BMI 40-49.9 (morbid obesity) (HCC) 04/29/2016   GAD (generalized anxiety disorder) 04/29/2016   Fatigue 04/29/2016   Insomnia 07/12/2015   Migraine without aura and with status migrainosus, not intractable 07/12/2015   Chronic superficial gastritis 06/02/2015   Chronic pain of multiple joints 05/15/2015   Bilateral leg edema 05/02/2015   Paroxysmal supraventricular tachycardia (HCC) 04/17/2015   Exertional shortness of breath 04/17/2015   Bright red rectal bleeding 04/06/2015   DDD (degenerative disc disease), lumbosacral 01/24/2014   DDD (degenerative disc disease), lumbar 01/24/2014   Cervico-occipital neuralgia 12/29/2013   Fibromyalgia 12/29/2013   Migraine headache 12/29/2013   Menorrhagia 12/10/2012   Depression, major, recurrent, in remission (HCC) 01/12/2009   Chest pain 01/12/2009   Hypertension, benign essential, goal below 140/90 06/23/2008   History of PSVT (paroxysmal supraventricular tachycardia) 06/17/2008   Obstructive sleep apnea 06/17/2008   GERD 06/13/2008      Current Outpatient Medications:  amitriptyline (ELAVIL) 150 MG tablet, Take 1 tablet (150 mg total) by mouth at bedtime., Disp: 90 tablet, Rfl: 3   Atogepant (QULIPTA) 30 MG TABS, Take 1 tablet (30 mg total) by mouth daily as needed., Disp: 30 tablet, Rfl: 3   atorvastatin (LIPITOR) 40 MG tablet, TAKE ONE TABLET BY MOUTH EVERY NIGHT AT BEDTIME, Disp: 90 tablet, Rfl: 3   baclofen (LIORESAL) 10 MG tablet, Take 0.5-1 tablets (5-10 mg total) by mouth 3 (three) times daily. Each refill  must last 30 days., Disp: 270 tablet, Rfl: 1   butalbital-acetaminophen-caffeine (BAC) 50-325-40 MG tablet, Take 1 tablet by mouth every 4 (four) hours as needed for migraine., Disp: 60 tablet, Rfl: 3   diclofenac Sodium (VOLTAREN) 1 % GEL, Apply topically., Disp: , Rfl:    dicyclomine (BENTYL) 10 MG capsule, Take by mouth., Disp: , Rfl:    doxycycline (VIBRAMYCIN) 100 MG capsule, Take 1 capsule (100 mg total) by mouth 2 (two) times daily., Disp: 20 capsule, Rfl: 0   lubiprostone (AMITIZA) 8 MCG capsule, Take 1 capsule (8 mcg total) by mouth 2 (two) times daily with a meal., Disp: 60 capsule, Rfl: 5   meclizine (ANTIVERT) 12.5 MG tablet, Take 1 tablet (12.5 mg total) by mouth 3 (three) times daily as needed for dizziness., Disp: 30 tablet, Rfl: 0   medroxyPROGESTERone (PROVERA) 5 MG tablet, Take 1 tablet (5 mg total) by mouth daily., Disp: 90 tablet, Rfl: 3   metoprolol succinate (TOPROL-XL) 100 MG 24 hr tablet, TAKE ONE TABLET (100 MG TOTAL) BY MOUTH DAILY. TAKE WITH OR IMMEDIATELY FOLLOWING A MEAL., Disp: 90 tablet, Rfl: 0   Needles & Syringes MISC, For administration of B12 injections, Disp: 30 each, Rfl: 0   ondansetron (ZOFRAN) 4 MG tablet, Take 1 tablet (4 mg total) by mouth every 8 (eight) hours as needed., Disp: 20 tablet, Rfl: 2   polyethylene glycol-electrolytes (NULYTELY) 420 g solution, Drink one 8 oz glass of mixture every 15 minutes until you finish all of the jug., Disp: 4000 mL, Rfl: 0   pregabalin (LYRICA) 150 MG capsule, Take 1 capsule (150 mg total) by mouth 3 (three) times daily as needed., Disp: 270 capsule, Rfl: 3   rizatriptan (MAXALT) 10 MG tablet, Take 1 tablet (10 mg total) by mouth as needed for migraine. May repeat in 2 hours if needed, Disp: 10 tablet, Rfl: 3   scopolamine (TRANSDERM-SCOP) 1 MG/3DAYS, Place 1 patch (1.5 mg total) onto the skin every 3 (three) days., Disp: 10 patch, Rfl: 12   triamcinolone cream (KENALOG) 0.1 %, Apply 1 Application topically 2 (two) times  daily as needed (itch/rash). Do not apply to open skin., Disp: 30 g, Rfl: 0   HYDROcodone-acetaminophen (NORCO) 7.5-325 MG tablet, Take 1 tablet by mouth 2 (two) times daily as needed for severe pain (pain score 7-10). Must last 30 days, Disp: 25 tablet, Rfl: 0   Allergies  Allergen Reactions   Aspirin Swelling   Cephalexin Rash   Cymbalta [Duloxetine Hcl] Other (See Comments)    Suicidal ideations and has homicidal thoughts per patient   Depakote [Divalproex Sodium] Shortness Of Breath    W/ n/v   Gadolinium Derivatives     Pt was unable to breath Other reaction(s): Other (See Comments) Pt was unable to breath   Haloperidol Shortness Of Breath    W/ n/v   Meperidine Nausea And Vomiting    Patient projectile vomits and usually result in ER Other reaction(s): Vomiting   Metoclopramide Shortness Of  Breath    Can't breath, wheezes  Other reaction(s): Other (See Comments)  Can't breath, wheezes Other reaction(s): Other (See Comments)   Morphine     Other reaction(s): Vomiting   Penicillins Rash    Other reaction(s): Unknown   Prochlorperazine Other (See Comments)    Panic attack Other reaction(s): Other (See Comments)   Tramadol Hcl Palpitations    Severely and adversely affects her SVT giving her tachycardias of 150-160 bpm.   Trazodone Shortness Of Breath    Other reaction(s): Other (See Comments)   Meloxicam Other (See Comments)    mouth sores, tingling, blisters in mouth Other reaction(s): Other (See Comments) mout sores, tingling mouth sores, tingling, blisters in mouth   Neomycin-Bacitracin Zn-Polymyx Rash   Tomato Hives    Tongue will blister   Egg-Derived Products Other (See Comments)    inflammation   Milk-Related Compounds Other (See Comments)    inflammation   Other     Other reaction(s): Other (See Comments)   Shellfish Allergy     Other reaction(s): Unknown   Shellfish-Derived Products Other (See Comments)    Other reaction(s): Unknown Other  reaction(s): Unknown   Bacitra-Neomycin-Polymyxin-Hc Rash   Bacitracin-Neomycin-Polymyxin Rash   Cephalosporins Rash    rash   Ibuprofen Other (See Comments) and Rash    Blisters in mouth. Blisters in mouth.   Latex Itching    Other reaction(s): Unknown   Nsaids Other (See Comments)    Blisters in mouth; can take 1 ibuprofen 2x a month Other reaction(s): Other (See Comments), Unknown Blisters in mouth; can take 1 ibuprofen 2x a month   Sulfa Antibiotics Rash    rash Other reaction(s): Unknown rash  Other reaction(s): Unknown rash rash Other reaction(s): Unknown rash   Sulfonamide Derivatives Rash    Social History   Tobacco Use   Smoking status: Former    Current packs/day: 0.00    Average packs/day: 2.0 packs/day for 3.0 years (6.0 ttl pk-yrs)    Types: Cigarettes    Start date: 12/10/1989    Quit date: 12/10/1992    Years since quitting: 31.0   Smokeless tobacco: Never   Tobacco comments:    quit 25 years ago  Vaping Use   Vaping status: Never Used  Substance Use Topics   Alcohol use: No    Comment: socially   Drug use: No      Chart Review Today: I personally reviewed active problem list, medication list, allergies, family history, social history, health maintenance, notes from last encounter, lab results, imaging with the patient/caregiver today.   Review of Systems  Constitutional: Negative.   HENT: Negative.    Eyes: Negative.   Respiratory: Negative.    Cardiovascular: Negative.   Gastrointestinal: Negative.   Endocrine: Negative.   Genitourinary: Negative.   Musculoskeletal: Negative.   Skin: Negative.   Allergic/Immunologic: Negative.   Neurological: Negative.   Hematological: Negative.   Psychiatric/Behavioral: Negative.    All other systems reviewed and are negative.      Objective:   Vitals:   12/09/23 1318 12/09/23 1328  BP: 120/74   Pulse: (!) 131 (!) 119  Resp: 16   SpO2: 93% 95%  Weight: 216 lb (98 kg)   Height: 5\' 3"  (1.6  m)     Body mass index is 38.26 kg/m.  Physical Exam Vitals and nursing note reviewed.  Constitutional:      Appearance: She is well-developed.  HENT:     Head: Normocephalic and atraumatic.  Nose: Nose normal.     Mouth/Throat:     Mouth: Mucous membranes are dry.  Eyes:     General:        Right eye: No discharge.        Left eye: No discharge.     Conjunctiva/sclera: Conjunctivae normal.  Neck:     Trachea: No tracheal deviation.  Cardiovascular:     Rate and Rhythm: Regular rhythm. Tachycardia present.     Pulses: Normal pulses.     Heart sounds: Normal heart sounds.  Pulmonary:     Effort: Pulmonary effort is normal. No respiratory distress.     Breath sounds: Normal breath sounds. No stridor.  Musculoskeletal:     Right lower leg: No edema.     Left lower leg: No edema.  Skin:    General: Skin is warm and dry.     Findings: Erythema present. No rash.  Neurological:     Mental Status: She is alert.     Motor: No abnormal muscle tone.     Coordination: Coordination normal.     Gait: Gait abnormal.  Psychiatric:        Behavior: Behavior normal.      Results for orders placed or performed during the hospital encounter of 12/04/23  CBG monitoring, ED   Collection Time: 12/04/23  3:05 PM  Result Value Ref Range   Glucose-Capillary 538 (HH) 70 - 99 mg/dL  Basic metabolic panel   Collection Time: 12/04/23  3:09 PM  Result Value Ref Range   Sodium 134 (L) 135 - 145 mmol/L   Potassium 4.0 3.5 - 5.1 mmol/L   Chloride 97 (L) 98 - 111 mmol/L   CO2 23 22 - 32 mmol/L   Glucose, Bld 508 (HH) 70 - 99 mg/dL   BUN 9 6 - 20 mg/dL   Creatinine, Ser 1.61 0.44 - 1.00 mg/dL   Calcium 9.8 8.9 - 09.6 mg/dL   GFR, Estimated >04 >54 mL/min   Anion gap 14 5 - 15  CBC   Collection Time: 12/04/23  3:09 PM  Result Value Ref Range   WBC 5.9 4.0 - 10.5 K/uL   RBC 4.01 3.87 - 5.11 MIL/uL   Hemoglobin 12.8 12.0 - 15.0 g/dL   HCT 09.8 11.9 - 14.7 %   MCV 95.8 80.0 - 100.0 fL    MCH 31.9 26.0 - 34.0 pg   MCHC 33.3 30.0 - 36.0 g/dL   RDW 82.9 56.2 - 13.0 %   Platelets 232 150 - 400 K/uL   nRBC 0.0 0.0 - 0.2 %  Urinalysis, Routine w reflex microscopic -Urine, Clean Catch   Collection Time: 12/04/23  3:09 PM  Result Value Ref Range   Color, Urine STRAW (A) YELLOW   APPearance CLEAR (A) CLEAR   Specific Gravity, Urine 1.023 1.005 - 1.030   pH 5.0 5.0 - 8.0   Glucose, UA >=500 (A) NEGATIVE mg/dL   Hgb urine dipstick SMALL (A) NEGATIVE   Bilirubin Urine NEGATIVE NEGATIVE   Ketones, ur NEGATIVE NEGATIVE mg/dL   Protein, ur NEGATIVE NEGATIVE mg/dL   Nitrite NEGATIVE NEGATIVE   Leukocytes,Ua NEGATIVE NEGATIVE   RBC / HPF 0-5 0 - 5 RBC/hpf   WBC, UA 0-5 0 - 5 WBC/hpf   Bacteria, UA NONE SEEN NONE SEEN   Squamous Epithelial / HPF 0-5 0 - 5 /HPF   Mucus PRESENT   Lactic acid, plasma   Collection Time: 12/04/23  3:09 PM  Result Value Ref Range  Lactic Acid, Venous 3.1 (HH) 0.5 - 1.9 mmol/L  Pregnancy, urine   Collection Time: 12/04/23  3:09 PM  Result Value Ref Range   Preg Test, Ur NEGATIVE NEGATIVE  Blood Culture (routine x 2)   Collection Time: 12/04/23  4:30 PM   Specimen: BLOOD  Result Value Ref Range   Specimen Description BLOOD RIGHT ANTECUBITAL    Special Requests      BOTTLES DRAWN AEROBIC AND ANAEROBIC Blood Culture results may not be optimal due to an inadequate volume of blood received in culture bottles   Culture      NO GROWTH 5 DAYS Performed at [facility], [address] [place], [place] [address]    Report Status 12/09/2023 FINAL   Blood Culture (routine x 2)   Collection Time: 12/04/23  4:30 PM   Specimen: BLOOD  Result Value Ref Range   Specimen Description BLOOD LEFT ANTECUBITAL    Special Requests      BOTTLES DRAWN AEROBIC AND ANAEROBIC Blood Culture adequate volume   Culture      NO GROWTH 5 DAYS Performed at [facility], [address] [place], [place] [address]    Report Status 12/09/2023 FINAL    Lactic acid, plasma   Collection Time: 12/04/23  4:30 PM  Result Value Ref Range   Lactic Acid, Venous 2.4 (HH) 0.5 - 1.9 mmol/L  CBG monitoring, ED   Collection Time: 12/04/23  4:37 PM  Result Value Ref Range   Glucose-Capillary 375 (H) 70 - 99 mg/dL  POC CBG, ED   Collection Time: 12/04/23  6:42 PM  Result Value Ref Range   Glucose-Capillary 250 (H) 70 - 99 mg/dL   *Note: Due to a large number of results and/or encounters for the requested time period, some results have not been displayed. A complete set of results can be found in Results Review.       Assessment & Plan:    1. Cellulitis of left lower extremity (Primary) Recheck labs, r/o DVT (though it would be atypical presentation) and start and complete doxy - CBC with Differential/Platelet - US Venous Img Lower Unilateral Left  2. Cat scratch of lower leg, initial encounter See #1 - CBC with Differential/Platelet  3. Hyperglycemia Very high sugars for her hx of DM Some food/diet choices contributing for the past couple months, since last A1C was near goal and pt is only on metformin will continue to monitor sugars and work on diet, she should notify us with sugars >300 Labs due very soon - will recheck A1C prior to med changes - metFORMIN (GLUCOPHAGE) 1000 MG tablet; Take 1 tablet (1,000 mg total) by mouth 2 (two) times daily with a meal.  Dispense: 180 tablet; Refill: 3 - Semaglutide,0.25 or 0.5MG /DOS, (OZEMPIC, 0.25 OR 0.5 MG/DOSE,) 2 MG/3ML SOPN; Inject 0.25 mg into the skin once a week for 28 days, THEN 0.5 mg once a week.  Dispense: 6 mL; Refill: 0 - TSH - Hemoglobin A1c  4. Type 2 diabetes mellitus without complication, without long-term current use of insulin (HCC) See above No contraindications to GLP-1 Can add this if she tolerates and continue to work on diet - metFORMIN (GLUCOPHAGE) 1000 MG tablet; Take 1 tablet (1,000 mg total) by mouth 2 (two) times daily with a meal.  Dispense: 180 tablet; Refill: 3 -  Semaglutide,0.25 or 0.5MG /DOS, (OZEMPIC, 0.25 OR 0.5 MG/DOSE,) 2 MG/3ML SOPN; Inject 0.25 mg into the skin once a week for 28 days, THEN 0.5 mg once  a week.  Dispense: 6 mL; Refill: 0 - TSH - Hemoglobin A1c  5. Encounter for examination following treatment at hospital  - CBC with Differential/Platelet - COMPLETE METABOLIC PANEL WITH GFR - TSH - T4, free - Hemoglobin A1c  6. Tachycardia Hx of SVT Current infection, some signs of dehydration likely secondary to hyperglycemia Increase BB dose Will get [place] to r/o Dvt With ambulation she remains tachy but has not tachypnea or hypoxia which is reassuring - CBC with Differential/Platelet - COMPLETE METABOLIC PANEL WITH GFR - T4, free - metoprolol succinate (TOPROL-XL) 100 MG 24 hr tablet; Take 1.5 tablets (150 mg total) by mouth daily. Take with or immediately following a meal.  Dispense: 135 tablet; Refill: 0  7.  Leg edema, left - US Venous Img Lower Unilateral Left   Labs due after 3/20 and then do f/up OV after labs done for routine f/up, address DM/A1C and recheck HR and leg   Danelle Berry, PA-C 12/09/23 1:28 PM

## 2023-12-09 NOTE — Telephone Encounter (Signed)
 Prior Authorization from [employer] (FREESTYLE LIBRE 3 READER) DEVI   Key: Jae Dire

## 2023-12-10 ENCOUNTER — Encounter: Payer: Self-pay | Admitting: Family Medicine

## 2023-12-10 ENCOUNTER — Other Ambulatory Visit: Payer: Self-pay

## 2023-12-10 ENCOUNTER — Ambulatory Visit
Admission: RE | Admit: 2023-12-10 | Discharge: 2023-12-10 | Disposition: A | Discharge status: Home / Self Care | Source: Ambulatory Visit | Attending: Family Medicine | Admitting: Family Medicine

## 2023-12-10 DIAGNOSIS — L03116 Cellulitis of left lower limb: Secondary | ICD-10-CM | POA: Diagnosis not present

## 2023-12-10 DIAGNOSIS — R600 Localized edema: Secondary | ICD-10-CM | POA: Diagnosis not present

## 2023-12-10 DIAGNOSIS — M7989 Other specified soft tissue disorders: Secondary | ICD-10-CM | POA: Diagnosis not present

## 2023-12-10 NOTE — Telephone Encounter (Signed)
 PA Case: [id number] Status: Approved, Coverage 12/10/2023-06/07/2024

## 2023-12-10 NOTE — Telephone Encounter (Signed)
 PA Case: [id number] Status: Approved, Coverage 12/09/2023-12/08/2024.

## 2023-12-10 NOTE — Telephone Encounter (Signed)
PA submitted waiting for determination

## 2023-12-11 NOTE — Progress Notes (Signed)
    GYNECOLOGY PROGRESS NOTE  Subjective:  PCP: Danelle Berry, PA-C  Patient ID: Dana Bradley, female    DOB: [birth year], 54 y.o.   MRN: [id number]  HPI  Patient is a 53 y.o. G45P3003 female who presents for follow up on AUB. Reports bleeding sopped on 11/23/23. Pt states she wants to be tested for STD, she has noticed some line of pimples in her vaginal area, sometimes they hurt. She has not been sexually active since September. Wants to make sure her husband didn't give her anything. She states the pimples haven't really gone away in 6-8 months.   The following portions of the patient's history were reviewed and updated as appropriate: allergies, current medications, past family history, past medical history, past social history, past surgical history, and problem list.  Review of Systems Pertinent items are noted in HPI.   Objective:   Blood pressure 123/73, pulse (!) 117, height 5\' 3"  (1.6 m), weight 212 lb (96.2 kg), last menstrual period 09/16/2023. Body mass index is 37.55 kg/m.  Physical Exam Vitals and nursing note reviewed. Exam conducted with a chaperone present.  Constitutional:      Appearance: Normal appearance. She is obese.  HENT:     Head: Normocephalic and atraumatic.  Eyes:     Extraocular Movements: Extraocular movements intact.  Pulmonary:     Effort: Pulmonary effort is normal.  Abdominal:     General: Abdomen is flat.     Palpations: Abdomen is soft.  Genitourinary:    General: Normal vulva.     Pubic Area: No rash.      Labia:        Right: Lesion present. No rash or tenderness.        Left: Lesion present. No rash or tenderness.        Comments: Bilateral labia with inclusion cysts 2-96mm, white contents visible, no ulcerations, redness or swelling.  Musculoskeletal:        General: Normal range of motion.  Skin:    General: Skin is warm and dry.  Neurological:     General: No focal deficit present.     Mental Status: She is alert.  Psychiatric:         Mood and Affect: Mood normal.    Assessment/Plan:   1. Inclusion cyst of vulva    Dana Bradley is a 53 y.o. G3P3003 here today with multiple inclusion cysts on bilateral labia. None appear inflamed or infected today. We discussed expectant management, sitz baths and compresses when they get sore, and ultimately having them removed if inflamed/infected. She would like to watch and wait, will notify clinic if desires removal.   2. AUB: perimenopausal irregularity, negative EMB and [place], now suppressed by Provera 5mg  daily and no further bleeding. Pt desires to continue this medication and has refills.     Julieanne Manson, DO [facility]

## 2023-12-15 ENCOUNTER — Encounter: Payer: Self-pay | Admitting: Obstetrics

## 2023-12-15 ENCOUNTER — Ambulatory Visit: Payer: Medicaid Other | Admitting: Obstetrics

## 2023-12-15 VITALS — BP 123/73 | HR 117 | Ht 63.0 in | Wt 212.0 lb

## 2023-12-15 DIAGNOSIS — N951 Menopausal and female climacteric states: Secondary | ICD-10-CM | POA: Insufficient documentation

## 2023-12-15 DIAGNOSIS — N939 Abnormal uterine and vaginal bleeding, unspecified: Secondary | ICD-10-CM | POA: Diagnosis not present

## 2023-12-15 DIAGNOSIS — N926 Irregular menstruation, unspecified: Secondary | ICD-10-CM | POA: Insufficient documentation

## 2023-12-15 DIAGNOSIS — N907 Vulvar cyst: Secondary | ICD-10-CM | POA: Diagnosis not present

## 2023-12-16 ENCOUNTER — Encounter: Payer: Self-pay | Admitting: Family Medicine

## 2023-12-16 ENCOUNTER — Other Ambulatory Visit: Payer: Self-pay | Admitting: Family Medicine

## 2023-12-16 ENCOUNTER — Other Ambulatory Visit: Payer: Self-pay | Admitting: Physical Medicine and Rehabilitation

## 2023-12-16 DIAGNOSIS — E782 Mixed hyperlipidemia: Secondary | ICD-10-CM

## 2023-12-16 DIAGNOSIS — M797 Fibromyalgia: Secondary | ICD-10-CM

## 2023-12-16 MED ORDER — PREGABALIN 150 MG PO CAPS: 150.0000 mg | ORAL_CAPSULE | Freq: Three times a day (TID) | ORAL | 3 refills | Status: DC | PRN

## 2023-12-18 ENCOUNTER — Other Ambulatory Visit: Payer: Self-pay

## 2023-12-18 ENCOUNTER — Other Ambulatory Visit: Payer: Self-pay | Admitting: Pharmacy Technician

## 2023-12-18 NOTE — Progress Notes (Signed)
 Disenrolled; Last filled 06/16/2023

## 2023-12-19 DIAGNOSIS — R000 Tachycardia, unspecified: Secondary | ICD-10-CM | POA: Diagnosis not present

## 2023-12-19 DIAGNOSIS — Z09 Encounter for follow-up examination after completed treatment for conditions other than malignant neoplasm: Secondary | ICD-10-CM | POA: Diagnosis not present

## 2023-12-19 DIAGNOSIS — L03116 Cellulitis of left lower limb: Secondary | ICD-10-CM | POA: Diagnosis not present

## 2023-12-19 DIAGNOSIS — W5503XA Scratched by cat, initial encounter: Secondary | ICD-10-CM | POA: Diagnosis not present

## 2023-12-19 DIAGNOSIS — S80819A Abrasion, unspecified lower leg, initial encounter: Secondary | ICD-10-CM | POA: Diagnosis not present

## 2023-12-19 DIAGNOSIS — R739 Hyperglycemia, unspecified: Secondary | ICD-10-CM | POA: Diagnosis not present

## 2023-12-19 DIAGNOSIS — E119 Type 2 diabetes mellitus without complications: Secondary | ICD-10-CM | POA: Diagnosis not present

## 2023-12-19 LAB — HM DIABETES EYE EXAM

## 2023-12-20 LAB — COMPREHENSIVE METABOLIC PANEL
AG Ratio: 1.9 (calc) (ref 1.0–2.5)
ALT: 33 U/L — ABNORMAL HIGH (ref 6–29)
AST: 21 U/L (ref 10–35)
Albumin: 4.6 g/dL (ref 3.6–5.1)
Alkaline phosphatase (APISO): 90 U/L (ref 37–153)
BUN: 12 mg/dL (ref 7–25)
CO2: 23 mmol/L (ref 20–32)
Calcium: 10 mg/dL (ref 8.6–10.4)
Chloride: 104 mmol/L (ref 98–110)
Creat: 0.67 mg/dL (ref 0.50–1.03)
Globulin: 2.4 g/dL (ref 1.9–3.7)
Glucose, Bld: 237 mg/dL — ABNORMAL HIGH (ref 65–99)
Potassium: 4 mmol/L (ref 3.5–5.3)
Sodium: 140 mmol/L (ref 135–146)
Total Bilirubin: 0.5 mg/dL (ref 0.2–1.2)
Total Protein: 7 g/dL (ref 6.1–8.1)
eGFR: 105 mL/min/{1.73_m2} (ref >=60)

## 2023-12-20 LAB — CBC WITH DIFFERENTIAL/PLATELET
Absolute Lymphocytes: 2329 {cells}/uL (ref 850–3900)
Absolute Monocytes: 663 {cells}/uL (ref 200–950)
Basophils Absolute: 77 {cells}/uL (ref 0–200)
Basophils Relative: 0.9 %
Eosinophils Absolute: 221 {cells}/uL (ref 15–500)
Eosinophils Relative: 2.6 %
HCT: 40.8 % (ref 35.0–45.0)
Hemoglobin: 13.9 g/dL (ref 11.7–15.5)
MCH: 31.9 pg (ref 27.0–33.0)
MCHC: 34.1 g/dL (ref 32.0–36.0)
MCV: 93.6 fL (ref 80.0–100.0)
MPV: 10.4 fL (ref 7.5–12.5)
Monocytes Relative: 7.8 %
Neutro Abs: 5211 {cells}/uL (ref 1500–7800)
Neutrophils Relative %: 61.3 %
Platelets: 278 10*3/uL (ref 140–400)
RBC: 4.36 10*6/uL (ref 3.80–5.10)
RDW: 13.2 % (ref 11.0–15.0)
Total Lymphocyte: 27.4 %
WBC: 8.5 10*3/uL (ref 3.8–10.8)

## 2023-12-20 LAB — TSH: TSH: 1.45 m[IU]/L

## 2023-12-20 LAB — HEMOGLOBIN A1C
Hgb A1c MFr Bld: 9.1 %{Hb} — ABNORMAL HIGH (ref <5.7)
Mean Plasma Glucose: 214 mg/dL
eAG (mmol/L): 11.9 mmol/L

## 2023-12-20 LAB — T4, FREE: Free T4: 1.2 ng/dL (ref 0.8–1.8)

## 2023-12-22 ENCOUNTER — Ambulatory Visit (INDEPENDENT_AMBULATORY_CARE_PROVIDER_SITE_OTHER): Payer: Medicaid Other | Admitting: Family Medicine

## 2023-12-22 ENCOUNTER — Encounter: Payer: Self-pay | Admitting: Family Medicine

## 2023-12-22 VITALS — BP 128/74 | HR 92 | Resp 16 | Ht 63.0 in | Wt 225.0 lb

## 2023-12-22 DIAGNOSIS — M5441 Lumbago with sciatica, right side: Secondary | ICD-10-CM | POA: Diagnosis not present

## 2023-12-22 DIAGNOSIS — M5442 Lumbago with sciatica, left side: Secondary | ICD-10-CM

## 2023-12-22 DIAGNOSIS — G8929 Other chronic pain: Secondary | ICD-10-CM

## 2023-12-22 DIAGNOSIS — L989 Disorder of the skin and subcutaneous tissue, unspecified: Secondary | ICD-10-CM | POA: Diagnosis not present

## 2023-12-22 DIAGNOSIS — M5416 Radiculopathy, lumbar region: Secondary | ICD-10-CM

## 2023-12-22 DIAGNOSIS — E1169 Type 2 diabetes mellitus with other specified complication: Secondary | ICD-10-CM

## 2023-12-22 DIAGNOSIS — M47816 Spondylosis without myelopathy or radiculopathy, lumbar region: Secondary | ICD-10-CM

## 2023-12-22 DIAGNOSIS — I471 Supraventricular tachycardia, unspecified: Secondary | ICD-10-CM | POA: Diagnosis not present

## 2023-12-22 DIAGNOSIS — E119 Type 2 diabetes mellitus without complications: Secondary | ICD-10-CM

## 2023-12-22 DIAGNOSIS — E782 Mixed hyperlipidemia: Secondary | ICD-10-CM | POA: Diagnosis not present

## 2023-12-22 DIAGNOSIS — I10 Essential (primary) hypertension: Secondary | ICD-10-CM

## 2023-12-22 NOTE — Assessment & Plan Note (Addendum)
 BP at goal with current meds BP Readings from Last 3 Encounters:  12/22/23 128/74  12/15/23 123/73  12/09/23 120/74

## 2023-12-22 NOTE — Patient Instructions (Addendum)
 Reminder - send me a msg for how you tolerate Ozempic 0.50 mg dose so I can send in a refill of the correct dose We will have you return for blood sugar recheck in 3 months - come sooner if having side effects of problems with the meds or sugars over 250.  With the next labs we will recheck cholesterol.    I will put in referrals to dermatology and spine

## 2023-12-22 NOTE — Assessment & Plan Note (Signed)
 No change to statin Will recheck lipids at next routine OV

## 2023-12-22 NOTE — Assessment & Plan Note (Signed)
 Tachycardia improved with increased BB Continue dose - 150 mg metoprolol XL daily Pulse Readings from Last 3 Encounters:  12/22/23 92  12/15/23 (!) 117  12/09/23 (!) 119

## 2023-12-22 NOTE — Assessment & Plan Note (Signed)
 Labs completed last week, reviewed today A1c increased and uncontrolled Lab Results  Component Value Date   HGBA1C 9.1 (H) 12/19/2023   HGBA1C 7.0 (H) 09/19/2023   HGBA1C 7.3 (H) 02/13/2023   HGBA1C 6.6 (H) 04/17/2022   HGBA1C 7.5 (H) 12/27/2021  Started ozempic yesterday Reviewed libre 3 reader charts/readings glucose spikes are between 200-300 Needs more days of readings, but she has been working on diet and sugars are better than a few weeks ago Reviewed dosing of ozempic she will do 0.25 mg dose x 4 weeks then increase to 0.50 mg dose x 2 weeks and send mychart message about her tolerance so I can order refills Will need 3 month f/up with labs

## 2023-12-22 NOTE — Assessment & Plan Note (Signed)
 Needs specialists who is able to do procedures/injections  She will continue to see PM&R

## 2023-12-22 NOTE — Telephone Encounter (Signed)
 Referrals done with OV

## 2023-12-22 NOTE — Progress Notes (Signed)
 Name: Dana Bradley   MRN: [id number]    DOB: 02-06-1971   Date:12/22/2023       Progress Note  Chief Complaint  Patient presents with   Medical Management of Chronic Issues     Subjective:   Dana Bradley is a 53 y.o. female, presents to clinic for routine follow up on chronic conditions  DM:   Pt managing DM with metformin and just started ozempic first lowest dose yesterday, tolerating so far w/o any SE or sx Blood sugars - high for the past couple weeks to months, libre 3 reader for the past 2.5 days highs in mid 200's spikes with food Denies: vision changes, neuropathy, hypoglycemia Polyuria polydipsia is improving some over the past 2 weeks Recent pertinent labs: Lab Results  Component Value Date   HGBA1C 9.1 (H) 12/19/2023   HGBA1C 7.0 (H) 09/19/2023   HGBA1C 7.3 (H) 02/13/2023   Lab Results  Component Value Date   MICROALBUR 0.4 02/13/2023   LDLCALC 91 02/13/2023   CREATININE 0.67 12/19/2023   Standard of care and health maintenance: Urine Microalbumin:  done Foot exam:  due DM eye exam:  did eye exam on Friday - Nice eyes ACEI/ARB:   Statin:  on statin  Tachycardia with hx of SVT on BB, dose was increased after last 2 OV pt had higher than her baseline pulse She is taking 150 mg daily now, increased from 100 mg Tolerating with less tachycardia, no SOB, near syncope or fatigue Pulse Readings from Last 3 Encounters:  12/22/23 92  12/15/23 (!) 117  12/09/23 (!) 119    Lesion to back onset more than 10 years ago, no changes, she is concerned with discoloration and wanting it checked   She also needs referral to specialists for low back for injections   Current Outpatient Medications:    amitriptyline (ELAVIL) 150 MG tablet, Take 1 tablet (150 mg total) by mouth at bedtime., Disp: 90 tablet, Rfl: 3   Atogepant (QULIPTA) 30 MG TABS, Take 1 tablet (30 mg total) by mouth daily as needed., Disp: 30 tablet, Rfl: 3   atorvastatin (LIPITOR) 40 MG tablet, TAKE ONE  TABLET BY MOUTH EVERY NIGHT AT BEDTIME, Disp: 90 tablet, Rfl: 3   baclofen (LIORESAL) 10 MG tablet, Take 0.5-1 tablets (5-10 mg total) by mouth 3 (three) times daily. Each refill must last 30 days., Disp: 270 tablet, Rfl: 1   butalbital-acetaminophen-caffeine (BAC) 50-325-40 MG tablet, Take 1 tablet by mouth every 4 (four) hours as needed for migraine., Disp: 60 tablet, Rfl: 3   Continuous Glucose Receiver (FREESTYLE LIBRE 3 READER) DEVI, Dispense one device for uncontrolled diabetes with hyperglycemia, Disp: 1 each, Rfl: 0   Continuous Glucose Sensor (FREESTYLE LIBRE 3 SENSOR) MISC, 1 Device by Does not apply route every 14 (fourteen) days., Disp: 2 each, Rfl: 11   diclofenac Sodium (VOLTAREN) 1 % GEL, Apply topically., Disp: , Rfl:    dicyclomine (BENTYL) 10 MG capsule, Take by mouth., Disp: , Rfl:    lubiprostone (AMITIZA) 8 MCG capsule, Take 1 capsule (8 mcg total) by mouth 2 (two) times daily with a meal., Disp: 60 capsule, Rfl: 5   meclizine (ANTIVERT) 12.5 MG tablet, Take 1 tablet (12.5 mg total) by mouth 3 (three) times daily as needed for dizziness., Disp: 30 tablet, Rfl: 0   medroxyPROGESTERone (PROVERA) 5 MG tablet, Take 1 tablet (5 mg total) by mouth daily., Disp: 90 tablet, Rfl: 3   metFORMIN (GLUCOPHAGE) 1000 MG tablet, Take 1 tablet (  1,000 mg total) by mouth 2 (two) times daily with a meal., Disp: 180 tablet, Rfl: 3   metoprolol succinate (TOPROL-XL) 100 MG 24 hr tablet, Take 1.5 tablets (150 mg total) by mouth daily. Take with or immediately following a meal., Disp: 135 tablet, Rfl: 0   Needles & Syringes MISC, For administration of B12 injections, Disp: 30 each, Rfl: 0   ondansetron (ZOFRAN) 4 MG tablet, Take 1 tablet (4 mg total) by mouth every 8 (eight) hours as needed., Disp: 20 tablet, Rfl: 2   polyethylene glycol-electrolytes (NULYTELY) 420 g solution, Drink one 8 oz glass of mixture every 15 minutes until you finish all of the jug., Disp: 4000 mL, Rfl: 0   pregabalin (LYRICA)  150 MG capsule, Take 1 capsule (150 mg total) by mouth 3 (three) times daily as needed., Disp: 270 capsule, Rfl: 3   rizatriptan (MAXALT) 10 MG tablet, Take 1 tablet (10 mg total) by mouth as needed for migraine. May repeat in 2 hours if needed, Disp: 10 tablet, Rfl: 3   scopolamine (TRANSDERM-SCOP) 1 MG/3DAYS, Place 1 patch (1.5 mg total) onto the skin every 3 (three) days., Disp: 10 patch, Rfl: 12   Semaglutide,0.25 or 0.5MG /DOS, (OZEMPIC, 0.25 OR 0.5 MG/DOSE,) 2 MG/3ML SOPN, Inject 0.25 mg into the skin once a week for 28 days, THEN 0.5 mg once a week., Disp: 6 mL, Rfl: 0   triamcinolone cream (KENALOG) 0.1 %, Apply 1 Application topically 2 (two) times daily as needed (itch/rash). Do not apply to open skin., Disp: 30 g, Rfl: 0   HYDROcodone-acetaminophen (NORCO) 7.5-325 MG tablet, Take 1 tablet by mouth 2 (two) times daily as needed for severe pain (pain score 7-10). Must last 30 days, Disp: 25 tablet, Rfl: 0  Patient Active Problem List   Diagnosis Date Noted   Inclusion cyst of vulva 12/15/2023   Perimenopause 12/15/2023   Irregular menses 12/15/2023   Pharmacologic therapy 03/12/2023   Chronic use of opiate for therapeutic purpose 03/12/2023   Abnormal CT of the abdomen 11/13/2022   Adenomatous polyp of colon 11/13/2022   Herniated nucleus pulposus, L3-4 (Right) 10/29/2022   Lumbar radiculopathy 10/21/2022   Dyspepsia 09/26/2022   Blood in stool 09/26/2022   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HCC) 04/04/2022   Neurogenic bladder 01/28/2022   Urinary and fecal incontinence 01/28/2022   New onset type 2 diabetes mellitus (HCC) 01/02/2022   Excessive daytime sleepiness 12/14/2021   Lateral epicondylitis, left elbow 07/19/2021   Abnormal MRI, cervical spine (02/14/2021) 02/28/2021   Cervicalgia 02/13/2021   Abnormal MRI, lumbar spine (12/27/2021) 11/09/2020   Chronic midline thoracic back pain 11/09/2020   Abnormal MRI, thoracic spine (08/02/2019) 11/09/2020   Thoracic spinal stenosis (T7-8)  11/09/2020   Prolapse of thoracic disc with radiculopathy (T7-8) 11/09/2020   Spasm of muscle of lower back 11/09/2020   Vertigo, benign paroxysmal, unspecified laterality 11/09/2020   Uncomplicated opioid dependence (HCC) 11/08/2020   Hyperalgesia 03/07/2020   Neurogenic urinary incontinence 03/01/2020   Other intervertebral disc degeneration, lumbar region 03/01/2020   Spinal stenosis of lumbosacral region 03/01/2020   Colon cancer screening 02/06/2020   Lumbar radiculitis (Right) 09/21/2019   Chronic lower extremity pain (Bilateral) 09/06/2019   Intractable migraine with aura without status migrainosus 08/10/2019   Other specified dorsopathies, sacral and sacrococcygeal region 08/03/2019   Latex precautions, history of latex allergy 08/03/2019   History of allergy to radiographic contrast media 08/03/2019   DDD (degenerative disc disease), cervical 07/21/2019   Cervical facet syndrome (Bilateral) (  L>R) 07/21/2019   DDD (degenerative disc disease), thoracic 07/21/2019   Osteoarthritis of hip (Left) 07/21/2019   Chronic groin pain (Bilateral) (L>R) 07/21/2019   Chronic hip pain (Bilateral) (L>R) 07/21/2019   Somatic dysfunction of sacroiliac joint (Bilateral) 07/21/2019   Migraine with aura and with status migrainosus, not intractable 04/06/2019   Cervico-occipital neuralgia (Left) 04/06/2019   Weakness of leg (Left) 04/05/2019   Difficulty walking 04/05/2019   Chronic migraine without aura, with intractable migraine, so stated, with status migrainosus 12/27/2018   Malar rash 09/04/2018   Trigger point of neck (Left) 03/19/2018   Occipital headache 12/25/2017   Chronic fatigue syndrome with fibromyalgia 12/12/2017   Trigger point of shoulder region (Left) 11/17/2017   Myofascial pain syndrome (Left) (trapezius muscle) 07/22/2017   Lumbar L1-2 disc protrusion (Right) 04/07/2017   Muscle spasticity 04/01/2017   Osteoarthritis of shoulder (Bilateral) 04/01/2017   Lumbar spondylosis  01/06/2017   Chronic hip pain (Left) 12/24/2016   Chronic sacroiliac joint pain (Left) 12/24/2016   Lumbar facet syndrome (Bilateral) (L>R) 12/24/2016   Lumbar radiculitis (Left) 12/24/2016   Hypertriglyceridemia 11/27/2016   History of vasovagal episode 10/30/2016   Cervicogenic headache 09/09/2016   Medication monitoring encounter 08/29/2016   Controlled substance agreement signed 08/28/2016   Plantar fasciitis of left foot 08/28/2016   Vitamin B12 deficiency 08/28/2016   Hyperlipidemia 08/28/2016   Nephrolithiasis 08/12/2016   Chronic pain syndrome 08/07/2016   Long term current use of opiate analgesic 08/07/2016   Long term prescription opiate use 08/07/2016   Opiate use 08/07/2016   Long term prescription benzodiazepine use 08/07/2016   Neurogenic pain 08/07/2016   Chronic low back pain (1ry area of Pain) (Bilateral) (R>L) (midline) w/ sciatica (Bilateral) 08/07/2016   Chronic upper back pain (2ry area of Pain) (Bilateral) (L>R) 08/07/2016   Chronic abdominal pain (Right lower quadrant) 08/07/2016   Thoracic radiculitis (Bilateral: T10, T11) 08/07/2016   Chronic occipital neuralgia (3ry area of Pain) (Bilateral) (L>R) 08/07/2016   Chronic neck pain 08/07/2016   Chronic cervical radicular pain (Bilateral) (L>R) 08/07/2016   Chronic shoulder blade pain (Bilateral) (L>R) 08/07/2016   Chronic upper extremity pain (Bilateral) (R>L) 08/07/2016   Chronic knee pain (Bilateral) (R>L) 08/07/2016   Chronic ankle pain (Bilateral) 08/07/2016   Cervical spondylosis with myelopathy and radiculopathy 08/07/2016   Panic disorder with agoraphobia 05/29/2016   Depression, unspecified depression type 05/29/2016   Atypical lymphocytosis 05/01/2016   Vitamin D deficiency 05/01/2016   Chronic lower extremity cramps (Bilateral) (R>L) 04/29/2016   Obesity, Class III, BMI 40-49.9 (morbid obesity) (HCC) 04/29/2016   GAD (generalized anxiety disorder) 04/29/2016   Fatigue 04/29/2016   Insomnia  07/12/2015   Migraine without aura and with status migrainosus, not intractable 07/12/2015   Chronic superficial gastritis 06/02/2015   Chronic pain of multiple joints 05/15/2015   Bilateral leg edema 05/02/2015   Paroxysmal supraventricular tachycardia (HCC) 04/17/2015   Exertional shortness of breath 04/17/2015   Bright red rectal bleeding 04/06/2015   DDD (degenerative disc disease), lumbosacral 01/24/2014   DDD (degenerative disc disease), lumbar 01/24/2014   Cervico-occipital neuralgia 12/29/2013   Fibromyalgia 12/29/2013   Migraine headache 12/29/2013   Menorrhagia 12/10/2012   Depression, major, recurrent, in remission (HCC) 01/12/2009   Chest pain 01/12/2009   Hypertension, benign essential, goal below 140/90 06/23/2008   History of PSVT (paroxysmal supraventricular tachycardia) 06/17/2008   Obstructive sleep apnea 06/17/2008   GERD 06/13/2008    Past Surgical History:  Procedure Laterality Date   ABLATION  Uterine   BREAST BIOPSY Left    2014 U/S bx fibroadenoma   BREAST BIOPSY Left 05/28/2023   lr [place] bx 3:00 heart clip   BREAST BIOPSY Left 05/28/2023   [place] LT BREAST BX W LOC DEV 1ST LESION IMG BX SPEC US GUIDE 05/28/2023 [facility]   CARDIAC CATHETERIZATION     with ablation   COLONOSCOPY N/A 09/26/2022   Procedure: COLONOSCOPY;  Surgeon: Wyline Mood, MD;  Location: [facility] ENDOSCOPY;  Service: Gastroenterology;  Laterality: N/A;   COLONOSCOPY WITH PROPOFOL N/A 05/17/2015   Procedure: COLONOSCOPY WITH PROPOFOL;  Surgeon: Scot Jun, MD;  Location: [facility] ENDOSCOPY;  Service: Endoscopy;  Laterality: N/A;   COLONOSCOPY WITH PROPOFOL N/A 03/20/2020   Procedure: COLONOSCOPY WITH PROPOFOL;  Surgeon: Wyline Mood, MD;  Location: [facility] ENDOSCOPY;  Service: Gastroenterology;  Laterality: N/A;   COLONOSCOPY WITH PROPOFOL N/A 09/25/2022   Procedure: COLONOSCOPY WITH PROPOFOL;  Surgeon: Wyline Mood, MD;  Location: [facility] ENDOSCOPY;  Service: Gastroenterology;  Laterality:  N/A;   COLONOSCOPY WITH PROPOFOL N/A 10/02/2022   Procedure: COLONOSCOPY WITH PROPOFOL;  Surgeon: Wyline Mood, MD;  Location: [facility] ENDOSCOPY;  Service: Gastroenterology;  Laterality: N/A;   COLONOSCOPY WITH PROPOFOL N/A 11/13/2022   Procedure: COLONOSCOPY WITH PROPOFOL;  Surgeon: Wyline Mood, MD;  Location: [facility] ENDOSCOPY;  Service: Gastroenterology;  Laterality: N/A;   ESOPHAGOGASTRODUODENOSCOPY N/A 05/17/2015   Procedure: ESOPHAGOGASTRODUODENOSCOPY (EGD);  Surgeon: Scot Jun, MD;  Location: [facility] ENDOSCOPY;  Service: Endoscopy;  Laterality: N/A;   ESOPHAGOGASTRODUODENOSCOPY N/A 09/25/2022   Procedure: ESOPHAGOGASTRODUODENOSCOPY (EGD);  Surgeon: Wyline Mood, MD;  Location: [facility] ENDOSCOPY;  Service: Gastroenterology;  Laterality: N/A;   ESOPHAGOGASTRODUODENOSCOPY N/A 09/26/2022   Procedure: ESOPHAGOGASTRODUODENOSCOPY (EGD);  Surgeon: Wyline Mood, MD;  Location: [facility] ENDOSCOPY;  Service: Gastroenterology;  Laterality: N/A;   KNEE ARTHROSCOPY     spg     6/18   Tibial Tubercle Bypass Right 1998   TUBAL LIGATION  10/01/1999    Family History  Problem Relation Age of Onset   Depression Mother    Hypertension Mother    Cancer Mother        Skin   Hyperlipidemia Mother    Anxiety disorder Mother    Migraines Mother    Alcohol abuse Father    Depression Father    Stroke Father    Heart disease Father    Hypertension Father    Anxiety disorder Father    Depression Sister    Hyperlipidemia Sister    Diabetes Sister    Hypertension Sister    Polycystic ovary syndrome Sister    Bipolar disorder Sister    Anxiety disorder Sister    Migraines Sister    Depression Sister    Hypertension Sister    Anxiety disorder Sister    Migraines Sister    Breast cancer Maternal Grandmother 42   Cancer Maternal Grandmother 61       Breast   Thyroid disease Maternal Grandmother    Arthritis Maternal Grandmother    Hyperlipidemia Maternal Grandmother    Aneurysm Maternal Grandfather     Hypertension Maternal Grandfather    Heart disease Maternal Grandfather    Alzheimer's disease Paternal Grandmother    Heart attack Paternal Grandfather    Hypertension Paternal Grandfather    COPD Paternal Grandfather    Heart disease Paternal Grandfather    Migraines Daughter    Other Daughter        leomyoa scaroma   Migraines Daughter    Migraines Son    Alzheimer's  disease Other    Bladder Cancer Neg Hx    Kidney cancer Neg Hx     Social History   Tobacco Use   Smoking status: Former    Current packs/day: 0.00    Average packs/day: 2.0 packs/day for 3.0 years (6.0 ttl pk-yrs)    Types: Cigarettes    Start date: 12/10/1989    Quit date: 12/10/1992    Years since quitting: 31.0   Smokeless tobacco: Never   Tobacco comments:    quit 25 years ago  Vaping Use   Vaping status: Never Used  Substance Use Topics   Alcohol use: No    Comment: socially   Drug use: No     Allergies  Allergen Reactions   Aspirin Swelling   Cephalexin Rash   Cymbalta [Duloxetine Hcl] Other (See Comments)    Suicidal ideations and has homicidal thoughts per patient   Depakote [Divalproex Sodium] Shortness Of Breath    W/ n/v   Gadolinium Derivatives     Pt was unable to breath Other reaction(s): Other (See Comments) Pt was unable to breath   Haloperidol Shortness Of Breath    W/ n/v   Meperidine Nausea And Vomiting    Patient projectile vomits and usually result in ER Other reaction(s): Vomiting   Metoclopramide Shortness Of Breath    Can't breath, wheezes  Other reaction(s): Other (See Comments)  Can't breath, wheezes Other reaction(s): Other (See Comments)   Morphine     Other reaction(s): Vomiting   Penicillins Rash    Other reaction(s): Unknown   Prochlorperazine Other (See Comments)    Panic attack Other reaction(s): Other (See Comments)   Tramadol Hcl Palpitations    Severely and adversely affects her SVT giving her tachycardias of 150-160 bpm.   Trazodone Shortness Of  Breath    Other reaction(s): Other (See Comments)   Meloxicam Other (See Comments)    mouth sores, tingling, blisters in mouth Other reaction(s): Other (See Comments) mout sores, tingling mouth sores, tingling, blisters in mouth   Neomycin-Bacitracin Zn-Polymyx Rash   Tomato Hives    Tongue will blister   Egg-Derived Products Other (See Comments)    inflammation   Milk-Related Compounds Other (See Comments)    inflammation   Other     Other reaction(s): Other (See Comments)   Shellfish Allergy     Other reaction(s): Unknown   Shellfish-Derived Products Other (See Comments)    Other reaction(s): Unknown Other reaction(s): Unknown   Bacitra-Neomycin-Polymyxin-Hc Rash   Bacitracin-Neomycin-Polymyxin Rash   Cephalosporins Rash    rash   Ibuprofen Other (See Comments) and Rash    Blisters in mouth. Blisters in mouth.   Latex Itching    Other reaction(s): Unknown   Nsaids Other (See Comments)    Blisters in mouth; can take 1 ibuprofen 2x a month Other reaction(s): Other (See Comments), Unknown Blisters in mouth; can take 1 ibuprofen 2x a month   Sulfa Antibiotics Rash    rash Other reaction(s): Unknown rash  Other reaction(s): Unknown rash rash Other reaction(s): Unknown rash   Sulfonamide Derivatives Rash    Health Maintenance  Topic Date Due   FOOT EXAM  04/05/2023   OPHTHALMOLOGY EXAM  04/12/2023   COVID-19 Vaccine (4 - 2024-25 season) 06/01/2023   INFLUENZA VACCINE  12/29/2023 (Originally 05/01/2023)   Zoster Vaccines- Shingrix (1 of 2) 02/07/2024 (Originally 06/15/1990)   Pneumococcal Vaccine 69-79 Years old (1 of 2 - PCV) 11/09/2024 (Originally 06/15/1977)   Diabetic kidney evaluation - Urine  ACR  02/13/2024   HEMOGLOBIN A1C  06/20/2024   Diabetic kidney evaluation - eGFR measurement  12/18/2024   MAMMOGRAM  05/20/2025   Cervical Cancer Screening (HPV/Pap Cotest)  11/13/2025   Colonoscopy  11/13/2032   Hepatitis C Screening  Completed   HIV Screening   Completed   HPV VACCINES  Aged Out   DTaP/Tdap/Td  Discontinued    Chart Review Today: I personally reviewed active problem list, medication list, allergies, family history, social history, health maintenance, notes from last encounter, lab results, imaging with the patient/caregiver today.   Review of Systems  Constitutional: Negative.   HENT: Negative.    Eyes: Negative.   Respiratory: Negative.    Cardiovascular: Negative.   Gastrointestinal: Negative.   Endocrine: Negative.   Genitourinary: Negative.   Musculoskeletal: Negative.   Skin: Negative.   Allergic/Immunologic: Negative.   Neurological: Negative.   Hematological: Negative.   Psychiatric/Behavioral: Negative.    All other systems reviewed and are negative.    Objective:   Vitals:   12/22/23 1010  BP: 128/74  Pulse: 92  Resp: 16  SpO2: 97%  Weight: 225 lb (102.1 kg)  Height: 5\' 3"  (1.6 m)    Body mass index is 39.86 kg/m.  Physical Exam Vitals and nursing note reviewed.  Constitutional:      General: She is not in acute distress.    Appearance: She is well-developed. She is obese. She is not ill-appearing, toxic-appearing or diaphoretic.  HENT:     Head: Normocephalic and atraumatic.     Nose: Nose normal.  Eyes:     General:        Right eye: No discharge.        Left eye: No discharge.     Conjunctiva/sclera: Conjunctivae normal.  Neck:     Trachea: No tracheal deviation.  Cardiovascular:     Rate and Rhythm: Normal rate and regular rhythm.     Pulses: Normal pulses.  Pulmonary:     Effort: Pulmonary effort is normal. No respiratory distress.     Breath sounds: No stridor.  Musculoskeletal:        General: Normal range of motion.  Skin:    General: Skin is warm and dry.     Findings: Lesion (right mid back, brown oval lesion less than 1 cm, firm nodule to palpation feels roughly 1.5 cm diameter, nontender, no fluctuance, no surrounding edema or erythema) present. No rash.  Neurological:      Mental Status: She is alert.     Motor: No abnormal muscle tone.     Coordination: Coordination normal.     Gait: Gait abnormal.  Psychiatric:        Behavior: Behavior normal.      Functional Status Survey:   Results for orders placed or performed in visit on 12/09/23  CBC with Differential/Platelet   Collection Time: 12/19/23  3:01 PM  Result Value Ref Range   WBC 8.5 3.8 - 10.8 Thousand/uL   RBC 4.36 3.80 - 5.10 Million/uL   Hemoglobin 13.9 11.7 - 15.5 g/dL   HCT 42.5 95.6 - 38.7 %   MCV 93.6 80.0 - 100.0 fL   MCH 31.9 27.0 - 33.0 pg   MCHC 34.1 32.0 - 36.0 g/dL   RDW 56.4 33.2 - 95.1 %   Platelets 278 140 - 400 Thousand/uL   MPV 10.4 7.5 - 12.5 fL   Neutro Abs 5,211 1,500 - 7,800 cells/uL   Absolute Lymphocytes 2,329 850 - 3,900 cells/uL  Absolute Monocytes 663 200 - 950 cells/uL   Eosinophils Absolute 221 15 - 500 cells/uL   Basophils Absolute 77 0 - 200 cells/uL   Neutrophils Relative % 61.3 %   Total Lymphocyte 27.4 %   Monocytes Relative 7.8 %   Eosinophils Relative 2.6 %   Basophils Relative 0.9 %  TSH   Collection Time: 12/19/23  3:01 PM  Result Value Ref Range   TSH 1.45 mIU/L  T4, free   Collection Time: 12/19/23  3:01 PM  Result Value Ref Range   Free T4 1.2 0.8 - 1.8 ng/dL  Hemoglobin Z6X   Collection Time: 12/19/23  3:01 PM  Result Value Ref Range   Hgb A1c MFr Bld 9.1 (H) <5.7 % of total Hgb   Mean Plasma Glucose 214 mg/dL   eAG (mmol/L) 09.6 mmol/L  Comprehensive metabolic panel   Collection Time: 12/19/23  3:01 PM  Result Value Ref Range   Glucose, Bld 237 (H) 65 - 99 mg/dL   BUN 12 7 - 25 mg/dL   Creat 0.45 4.09 - 8.11 mg/dL   eGFR 914 > OR = 60 NW/GNF/6.21H0   BUN/Creatinine Ratio SEE NOTE: 6 - 22 (calc)   Sodium 140 135 - 146 mmol/L   Potassium 4.0 3.5 - 5.3 mmol/L   Chloride 104 98 - 110 mmol/L   CO2 23 20 - 32 mmol/L   Calcium 10.0 8.6 - 10.4 mg/dL   Total Protein 7.0 6.1 - 8.1 g/dL   Albumin 4.6 3.6 - 5.1 g/dL   Globulin  2.4 1.9 - 3.7 g/dL (calc)   AG Ratio 1.9 1.0 - 2.5 (calc)   Total Bilirubin 0.5 0.2 - 1.2 mg/dL   Alkaline phosphatase (APISO) 90 37 - 153 U/L   AST 21 10 - 35 U/L   ALT 33 (H) 6 - 29 U/L   *Note: Due to a large number of results and/or encounters for the requested time period, some results have not been displayed. A complete set of results can be found in Results Review.      Assessment & Plan:   Here for routine f/up and review of labs which were completed last week.  Type 2 diabetes mellitus without complication, without long-term current use of insulin (HCC) Assessment & Plan: Labs completed last week, reviewed today A1c increased and uncontrolled on metformin max dose, working on diet (diet much worse over holidays, better for the last month) and just Started ozempic yesterday Lab Results  Component Value Date   HGBA1C 9.1 (H) 12/19/2023   HGBA1C 7.0 (H) 09/19/2023   HGBA1C 7.3 (H) 02/13/2023   HGBA1C 6.6 (H) 04/17/2022   HGBA1C 7.5 (H) 12/27/2021  Reviewed libre 3 reader charts/readings glucose spikes are between 200-300 Needs more days of readings, but she has been working on diet and sugars are better than a few weeks ago Reviewed dosing of ozempic she will do 0.25 mg dose x 4 weeks then increase to 0.50 mg dose x 2 weeks and send mychart message about her tolerance so I can order refills Will need 3 month f/up with labs  Continue diet lifestyle efforts, metformin and ozempic dose with titration   Hypertension, benign essential, goal below 140/90 Assessment & Plan: BP at goal with current meds BP Readings from Last 3 Encounters:  12/22/23 128/74  12/15/23 123/73  12/09/23 120/74   Paroxysmal supraventricular tachycardia (HCC) Assessment & Plan: Tachycardia improved with increased BB Continue dose - 150 mg metoprolol XL daily Pulse Readings from  Last 3 Encounters:  12/22/23 92  12/15/23 (!) 117  12/09/23 (!) 119   Mixed hyperlipidemia Assessment & Plan: No  change to statin, continue atorvastatin 40 mg daily Will recheck lipids at next routine OV   Skin lesion of back right mid back solid nodule with discoloration brownish, no fluctuance, no erythema, she would like eval, onset more than 10 years ago, suspect cyst  -     Ambulatory referral to Dermatology  Chronic low back pain (1ry area of Pain) (Bilateral) (R>L) (midline) w/ sciatica (Bilateral) -     Ambulatory referral to Spine Surgery  Lumbar radiculopathy Assessment & Plan: Needs specialists who is able to do procedures/injections  She will continue to see PM&R  Orders: -     Ambulatory referral to Spine Surgery  Lumbar facet syndrome (Bilateral) (L>R) -     Ambulatory referral to Spine Surgery       Return in about 3 months (around 03/23/2024) for Routine follow-up (pt to send msg about ozempic in about 6 weeks).   Danelle Berry, PA-C 12/22/23 10:15 AM

## 2024-01-05 ENCOUNTER — Other Ambulatory Visit: Payer: Self-pay

## 2024-01-05 ENCOUNTER — Other Ambulatory Visit: Payer: Self-pay | Admitting: Physical Medicine and Rehabilitation

## 2024-01-05 ENCOUNTER — Other Ambulatory Visit: Payer: Self-pay | Admitting: Pharmacy Technician

## 2024-01-05 ENCOUNTER — Encounter
Payer: Medicaid Other | Attending: Physical Medicine and Rehabilitation | Admitting: Physical Medicine and Rehabilitation

## 2024-01-05 VITALS — BP 116/72 | HR 118 | Ht 63.0 in | Wt 223.0 lb

## 2024-01-05 DIAGNOSIS — M549 Dorsalgia, unspecified: Secondary | ICD-10-CM

## 2024-01-05 DIAGNOSIS — M545 Low back pain, unspecified: Secondary | ICD-10-CM

## 2024-01-05 DIAGNOSIS — M62838 Other muscle spasm: Secondary | ICD-10-CM

## 2024-01-05 DIAGNOSIS — M5481 Occipital neuralgia: Secondary | ICD-10-CM

## 2024-01-05 DIAGNOSIS — Z79899 Other long term (current) drug therapy: Secondary | ICD-10-CM

## 2024-01-05 DIAGNOSIS — M19012 Primary osteoarthritis, left shoulder: Secondary | ICD-10-CM | POA: Diagnosis not present

## 2024-01-05 DIAGNOSIS — Z79891 Long term (current) use of opiate analgesic: Secondary | ICD-10-CM

## 2024-01-05 DIAGNOSIS — G894 Chronic pain syndrome: Secondary | ICD-10-CM | POA: Diagnosis not present

## 2024-01-05 DIAGNOSIS — Z7409 Other reduced mobility: Secondary | ICD-10-CM | POA: Diagnosis not present

## 2024-01-05 DIAGNOSIS — M19011 Primary osteoarthritis, right shoulder: Secondary | ICD-10-CM

## 2024-01-05 DIAGNOSIS — M5441 Lumbago with sciatica, right side: Secondary | ICD-10-CM | POA: Diagnosis not present

## 2024-01-05 DIAGNOSIS — G8929 Other chronic pain: Secondary | ICD-10-CM | POA: Insufficient documentation

## 2024-01-05 DIAGNOSIS — M797 Fibromyalgia: Secondary | ICD-10-CM

## 2024-01-05 DIAGNOSIS — M5442 Lumbago with sciatica, left side: Secondary | ICD-10-CM | POA: Diagnosis not present

## 2024-01-05 MED ORDER — JOURNAVX 50 MG PO TABS: 1.0000 | ORAL_TABLET | Freq: Two times a day (BID) | ORAL | 0 refills | Status: DC | PRN

## 2024-01-05 MED ORDER — QULIPTA 30 MG PO TABS: 1.0000 | ORAL_TABLET | Freq: Every day | ORAL | 3 refills | Status: DC | PRN

## 2024-01-05 MED ORDER — HYDROCODONE-ACETAMINOPHEN 7.5-325 MG PO TABS: 1.0000 | ORAL_TABLET | Freq: Every day | ORAL | 0 refills | Status: DC | PRN

## 2024-01-05 MED ORDER — PREGABALIN 150 MG PO CAPS: 150.0000 mg | ORAL_CAPSULE | Freq: Three times a day (TID) | ORAL | 3 refills | Status: DC | PRN

## 2024-01-05 MED ORDER — VYEPTI 100 MG/ML IV SOLN
300.0000 mg | INTRAVENOUS | 3 refills | Status: DC
  Filled 2024-01-05: qty 2, 90d supply, fill #0

## 2024-01-05 NOTE — Patient Instructions (Signed)
 Sweat, sauna, [employer]  Lots of fruits and vegetables  Avoid eggs, dairy, gluten

## 2024-01-05 NOTE — Addendum Note (Signed)
 Addended by: Horton Chin on: 01/05/2024 12:19 PM   Modules accepted: Orders

## 2024-01-05 NOTE — Progress Notes (Signed)
 Subjective:    Patient ID: Dana Bradley, female    DOB: Sep 27, 1971, 53 y.o.   MRN: [id number]  HPI   1) Chronic Migraine:  -she needs an appointment at the infusion center -has only had 3 non-migraines days since Thanksgiving -stopped progesterone in March, she feels that this was helping in combination with the different medications -Dana Bradley [place] a 53 year old woman who presents for f/u of her chronic migraine  -discussed her food allergy test results -took the Nurtec and the Fioricet last night and her migraine got better -she thinks she been dehydrated -the Dana Bradley has helped -she thinks the Vyepti has helped  -migraines have been decent over the last few weeks -she thinks Vyepti helps -she is trying not to be stressed -Dana Bradley went to the cardiologist today and they are doing a cardiac MRI, and her fatigue is worse, she was told her heart is not pumping well. --she would like to try Zavzpret -she has ordered Bioptemizer's magnesium -she has been having stress due to her mother's Alzheimer's disease -she has been very fatigued during the day -she cannot be in light -on Monday she had to wear an eye mask in a darkened room. -Migraines will not get better unless she takes the Fioricet with the maxaalt but it puts her to sleepy -she feels she is getting a migraine every day -the Fioricet and Nurtec or Maxaalt together give her relief, but not complete relief.  -the days run into each other and its hard to remember the differences between them -she has been stressed in caring for her mother -she has noted recently that when she lays flat she feels fine but as soon as she sits up she feels an excruciating migraine -migraine continues to worsen from sitting to standing position -her daughter has similar symptoms but felt better after starting acetazolamide for presumed diagnosis of intracranial hypertension. She asks whether she may benefit from this medication as well.  Terrible  stomach spasms and diarrhea. 12 hours later debilitating migraine. It usually starts in the afternoon. She feels that this may be part of her prodrome. She also has terrible vertigo that is getting worse. The Fiorcet helps a lot, the Migronal helps with the frontal headache. These make the pain bearable.  -having migraines close to every 24H -she is not functional -she is not sure if Maxaalt is helping. She is not taking with the Nurtec. It is helping some but she is not sure how much. The fioricet alone helps by putting her to sleep.  -she has not heart any news about the Vypeti infusion. The medication was approved  -has been having more dizziness.  -the pain has been worst since she had the occipital nerve blocks.  -she knows it is stress related -she feels a pain radiating down her arm as well. It also runs into her head and into her face.  -Sometimes her whole left side is fizzy.  -she has tried Amitriptyline.  -have been awful since December. Had a migraine from Dec 1st throughout 9th.  -the day before she was incapacitated.  -Vyepti has been approved -The nurtec and the Fiorocet help together to break the migraine.  -she needs a refill of her Fioricet and nausea, she finds this helpful.  -Nurtec- she is still working with the company to get this improved -she asks about prodrome and postdrome (she experiences cramping and diarrhea).  -on Sunday she closed out a 56 hour migraines. -she has been trying to  follow ketogenic diet.  -Nurtec helping- 5 days in a row without a migraines.  -she feels if she could take it as a rescue medication in addition to every other day this may help.   2) Vertigo -Symptoms are worsening.  -She feels that this may be leading up to the vertigo.  -She is interested in vestibular therapy.  -she had a heating pad on the shoulder and arm.   3) Cervical myofasical pain syndrome: -Muscles of neck are very tight, pain is worse on the left side.  -She did  not find spinal injections helpful, they made the pain worse.   4) Insomnia: she sleeps in chunks -she is willing to increase amitriptyline to 50mg .   5) Cognitive impairments: -she loves to cook and noticed she has been making more mistakes -she thinks the headaches are a problem.  6) Pain radiating from back into legs -feels twitches -worse at night -legs kick out at night.   7) IBS -terrible in the night -2am on Sunday. -horrible pain -often with diarrhea and improves symptoms after she has diarrhea.  -cramping.  -last normal BM is 1/9 -having abdominal distension -magnesium citrate does not help -miralax multiple times per day do not help  8) Menstrual bleeding: -has been present since 12/11 -she was started on progesterone a few days before that date  9) depression: -she has dry mouth will wellbutrin and did not find much efficacy. She asked if there is an alternative medication she can try -she had tried Zoloft in the past with benefits -she is going to pick up the Zoloft today and try it tonight -she would be willing to take vitamin D  10) Low back pain: -radiates into her feet -not a good surgical candidate -she feels she will be wheelchair bound soon  11. Stress: -she is stressed that Dana Bradley as a pleural lesion  Dana Bradley is a 53 year old woman who presents for follow-up of her chronic migraine. Her average pain is 6/10 and pain right now is 6/10. Her pain is intermittent, sharp, burning, stabbing, tingling, and aching.   She recently had 7 days without migraine. She is not sure what was associated with this. She had a pretty terrible bounceback.  She picked up the Migranal nasal spray. Our CMA explained how it works.   Her pain doctor is going to stop prescribing the Lyrica and she needs to find a new doctor to prescribe this.   12) Impaired balance: -she was trying to trim trees the other day and had to keep catching herself with the pruners -she is  also started to having sharp pains on the right side as well as the left side   Pain Inventory Average Pain 5 Pain Right Now 6 My pain is intermittent, sharp, burning, stabbing, and aching  In the last 24 hours, has pain interfered with the following? General activity 3 Relation with others 3 Enjoyment of life 2 What TIME of day is your pain at its worst? morning , evening, and night Sleep (in general) Poor  Pain is worse with: bending and some activites Pain improves with: heat/ice, medication, and TENS Relief from Meds: 5    Family History  Problem Relation Age of Onset   Depression Mother    Hypertension Mother    Cancer Mother        Skin   Hyperlipidemia Mother    Anxiety disorder Mother    Migraines Mother    Alcohol abuse Father  Depression Father    Stroke Father    Heart disease Father    Hypertension Father    Anxiety disorder Father    Depression Sister    Hyperlipidemia Sister    Diabetes Sister    Hypertension Sister    Polycystic ovary syndrome Sister    Bipolar disorder Sister    Anxiety disorder Sister    Migraines Sister    Depression Sister    Hypertension Sister    Anxiety disorder Sister    Migraines Sister    Breast cancer Maternal Grandmother 72   Cancer Maternal Grandmother 65       Breast   Thyroid disease Maternal Grandmother    Arthritis Maternal Grandmother    Hyperlipidemia Maternal Grandmother    Aneurysm Maternal Grandfather    Hypertension Maternal Grandfather    Heart disease Maternal Grandfather    Alzheimer's disease Paternal Grandmother    Heart attack Paternal Grandfather    Hypertension Paternal Grandfather    COPD Paternal Grandfather    Heart disease Paternal Grandfather    Migraines Daughter    Other Daughter        leomyoa scaroma   Migraines Daughter    Migraines Son    Alzheimer's disease Other    Bladder Cancer Neg Hx    Kidney cancer Neg Hx    Social History   Socioeconomic History   Marital  status: Married    Spouse name: brian   Number of children: 3   Years of education: Not on file   Highest education level: Associate degree: academic program  Occupational History   Occupation: disbled    Comment: not able  Tobacco Use   Smoking status: Former    Current packs/day: 0.00    Average packs/day: 2.0 packs/day for 3.0 years (6.0 ttl pk-yrs)    Types: Cigarettes    Start date: 12/10/1989    Quit date: 12/10/1992    Years since quitting: 31.0   Smokeless tobacco: Never   Tobacco comments:    quit 25 years ago  Vaping Use   Vaping status: Never Used  Substance and Sexual Activity   Alcohol use: No    Comment: socially   Drug use: No   Sexual activity: Not Currently    Partners: Male    Birth control/protection: Surgical  Other Topics Concern   Not on file  Social History Narrative   Lives at home with her husband and 2 of her children   Right handed   Caffeine: 0-2 cups daily   Social Drivers of Health   Financial Resource Strain: High Risk (08/29/2017)   Overall Financial Resource Strain (CARDIA)    Difficulty of Paying Living Expenses: Very hard  Food Insecurity: Food Insecurity Present (08/29/2017)   Hunger Vital Sign    Worried About Running Out of Food in the Last Year: Often true    Ran Out of Food in the Last Year: Often true  Transportation Needs: Unmet Transportation Needs (08/29/2017)   PRAPARE - Administrator, Civil Service (Medical): Yes    Lack of Transportation (Non-Medical): Yes  Physical Activity: Inactive (08/29/2017)   Exercise Vital Sign    Days of Exercise per Week: 0 days    Minutes of Exercise per Session: 0 min  Stress: Stress Concern Present (08/29/2017)   [employer] of Occupational Health - Occupational Stress Questionnaire    Feeling of Stress : Rather much  Social Connections: Unknown (10/16/2022)   Received from [facility],  [facility]   Social Network    Social Network: Not on file   Past Surgical  History:  Procedure Laterality Date   ABLATION     Uterine   BREAST BIOPSY Left    2014 U/S bx fibroadenoma   BREAST BIOPSY Left 05/28/2023   lr [place] bx 3:00 heart clip   BREAST BIOPSY Left 05/28/2023   [place] LT BREAST BX W LOC DEV 1ST LESION IMG BX SPEC US GUIDE 05/28/2023 [facility]   CARDIAC CATHETERIZATION     with ablation   COLONOSCOPY N/A 09/26/2022   Procedure: COLONOSCOPY;  Surgeon: Wyline Mood, MD;  Location: [facility] ENDOSCOPY;  Service: Gastroenterology;  Laterality: N/A;   COLONOSCOPY WITH PROPOFOL N/A 05/17/2015   Procedure: COLONOSCOPY WITH PROPOFOL;  Surgeon: Scot Jun, MD;  Location: [facility] ENDOSCOPY;  Service: Endoscopy;  Laterality: N/A;   COLONOSCOPY WITH PROPOFOL N/A 03/20/2020   Procedure: COLONOSCOPY WITH PROPOFOL;  Surgeon: Wyline Mood, MD;  Location: [facility] ENDOSCOPY;  Service: Gastroenterology;  Laterality: N/A;   COLONOSCOPY WITH PROPOFOL N/A 09/25/2022   Procedure: COLONOSCOPY WITH PROPOFOL;  Surgeon: Wyline Mood, MD;  Location: [facility] ENDOSCOPY;  Service: Gastroenterology;  Laterality: N/A;   COLONOSCOPY WITH PROPOFOL N/A 10/02/2022   Procedure: COLONOSCOPY WITH PROPOFOL;  Surgeon: Wyline Mood, MD;  Location: [facility] ENDOSCOPY;  Service: Gastroenterology;  Laterality: N/A;   COLONOSCOPY WITH PROPOFOL N/A 11/13/2022   Procedure: COLONOSCOPY WITH PROPOFOL;  Surgeon: Wyline Mood, MD;  Location: [facility] ENDOSCOPY;  Service: Gastroenterology;  Laterality: N/A;   ESOPHAGOGASTRODUODENOSCOPY N/A 05/17/2015   Procedure: ESOPHAGOGASTRODUODENOSCOPY (EGD);  Surgeon: Scot Jun, MD;  Location: [facility] ENDOSCOPY;  Service: Endoscopy;  Laterality: N/A;   ESOPHAGOGASTRODUODENOSCOPY N/A 09/25/2022   Procedure: ESOPHAGOGASTRODUODENOSCOPY (EGD);  Surgeon: Wyline Mood, MD;  Location: [facility] ENDOSCOPY;  Service: Gastroenterology;  Laterality: N/A;   ESOPHAGOGASTRODUODENOSCOPY N/A 09/26/2022   Procedure: ESOPHAGOGASTRODUODENOSCOPY (EGD);  Surgeon: Wyline Mood, MD;  Location: [facility]  ENDOSCOPY;  Service: Gastroenterology;  Laterality: N/A;   KNEE ARTHROSCOPY     spg     6/18   Tibial Tubercle Bypass Right 1998   TUBAL LIGATION  10/01/1999   Past Surgical History:  Procedure Laterality Date   ABLATION     Uterine   BREAST BIOPSY Left    2014 U/S bx fibroadenoma   BREAST BIOPSY Left 05/28/2023   lr [place] bx 3:00 heart clip   BREAST BIOPSY Left 05/28/2023   [place] LT BREAST BX W LOC DEV 1ST LESION IMG BX SPEC US GUIDE 05/28/2023 [facility]   CARDIAC CATHETERIZATION     with ablation   COLONOSCOPY N/A 09/26/2022   Procedure: COLONOSCOPY;  Surgeon: Wyline Mood, MD;  Location: [facility] ENDOSCOPY;  Service: Gastroenterology;  Laterality: N/A;   COLONOSCOPY WITH PROPOFOL N/A 05/17/2015   Procedure: COLONOSCOPY WITH PROPOFOL;  Surgeon: Scot Jun, MD;  Location: [facility] ENDOSCOPY;  Service: Endoscopy;  Laterality: N/A;   COLONOSCOPY WITH PROPOFOL N/A 03/20/2020   Procedure: COLONOSCOPY WITH PROPOFOL;  Surgeon: Wyline Mood, MD;  Location: [facility] ENDOSCOPY;  Service: Gastroenterology;  Laterality: N/A;   COLONOSCOPY WITH PROPOFOL N/A 09/25/2022   Procedure: COLONOSCOPY WITH PROPOFOL;  Surgeon: Wyline Mood, MD;  Location: [facility] ENDOSCOPY;  Service: Gastroenterology;  Laterality: N/A;   COLONOSCOPY WITH PROPOFOL N/A 10/02/2022   Procedure: COLONOSCOPY WITH PROPOFOL;  Surgeon: Wyline Mood, MD;  Location: [facility] ENDOSCOPY;  Service: Gastroenterology;  Laterality: N/A;   COLONOSCOPY WITH PROPOFOL N/A 11/13/2022   Procedure: COLONOSCOPY WITH PROPOFOL;  Surgeon: Wyline Mood, MD;  Location: [facility] ENDOSCOPY;  Service: Gastroenterology;  Laterality: N/A;   ESOPHAGOGASTRODUODENOSCOPY N/A 05/17/2015   Procedure: ESOPHAGOGASTRODUODENOSCOPY (EGD);  Surgeon: Scot Jun, MD;  Location: [facility] ENDOSCOPY;  Service: Endoscopy;  Laterality: N/A;   ESOPHAGOGASTRODUODENOSCOPY N/A 09/25/2022   Procedure: ESOPHAGOGASTRODUODENOSCOPY (EGD);  Surgeon: Wyline Mood, MD;  Location: [facility] ENDOSCOPY;  Service:  Gastroenterology;  Laterality: N/A;   ESOPHAGOGASTRODUODENOSCOPY N/A 09/26/2022   Procedure: ESOPHAGOGASTRODUODENOSCOPY (EGD);  Surgeon: Wyline Mood, MD;  Location: [facility] ENDOSCOPY;  Service: Gastroenterology;  Laterality: N/A;   KNEE ARTHROSCOPY     spg     6/18   Tibial Tubercle Bypass Right 1998   TUBAL LIGATION  10/01/1999   Past Medical History:  Diagnosis Date   Acute postoperative pain 04/07/2017   Anxiety    Bursitis    Chronic fatigue 12/12/2017   Chronic fatigue syndrome    Colitis 2021   Diabetes mellitus without complication (HCC)    Edema leg 05/02/2015   Fibromyalgia    GERD (gastroesophageal reflux disease)    IBS (irritable bowel syndrome)    Knee pain, bilateral 12/21/2008   Qualifier: Diagnosis of  By: Daphine Deutscher FNP, [place]     Lumbar discitis    Migraines    Osteoarthritis    Right hand pain 04/10/2015   [facility] has done nerve conduction studies and ruled out carpal tunnel.    Sleep apnea    Spinal stenosis    SVT (supraventricular tachycardia) (HCC)    Vertigo    Vitamin D deficiency 05/01/2016   BP 116/72   Pulse (!) 118   Ht 5\' 3"  (1.6 m)   Wt 223 lb (101.2 kg)   SpO2 96%   BMI 39.50 kg/m   Opioid Risk Score:   Fall Risk Score:  `1  Depression screen PHQ 2/9     11/10/2023    4:06 PM 10/09/2023    2:16 PM 10/06/2023   11:59 AM 08/01/2023   11:03 AM 06/30/2023    2:25 PM 05/12/2023    2:54 PM 02/13/2023    9:43 AM  Depression screen PHQ 2/9  Decreased Interest 3 0 1 0 1 2 3   Down, Depressed, Hopeless 3 0 1 0 1 3 3   PHQ - 2 Score 6 0 2 0 2 5 6   Altered sleeping 3     2 3   Tired, decreased energy 3     2 3   Change in appetite 3     2 1   Feeling bad or failure about yourself  3     2 0  Trouble concentrating 3     2 1   Moving slowly or fidgety/restless 0     0 0  Suicidal thoughts 0     0 0  PHQ-9 Score 21     15 14   Difficult doing work/chores      Very difficult Very difficult   Review of Systems  Constitutional: Negative.    HENT: Negative.    Eyes: Negative.   Respiratory: Negative.    Cardiovascular: Negative.   Gastrointestinal: Negative.   Endocrine: Negative.   Genitourinary: Negative.   Musculoskeletal:  Positive for back pain.       Left side of body from head to toes  Skin: Negative.   Allergic/Immunologic: Negative.   Neurological:  Positive for dizziness and headaches.  Hematological: Negative.   Psychiatric/Behavioral: Negative.    All other systems reviewed and are negative.      Objective:   Physical Exam Gen: no distress, normal appearing HEENT: oral mucosa pink and  moist, NCAT Cardio: Reg rate Chest: normal effort, normal rate of breathing Abd: soft, non-distended Ext: no edema Psych: pleasant, normal affect Skin: intact Neuro: Alert and oriented without focal deficits     Assessment & Plan:  Mrs. Duarte is a 53 year old woman who presents for f/u with chronic intractable migraines s/p numerous treatments, severe fibromyalgia, and IBS, and nausea, and vertigo.     1) Vertigo: In the future, restart for cervical myofascial pain syndrome: myofascial release, postural correction, stretching and strengthening of the muscles of the neck and upper back, development of HEP. Conitnue heating pads to muscles of upper back and neck. She is doing HEP.  -referred for vestibular therapy.  -discussed current symptoms.  -discussed response to meclizine -discussed worsening symptoms when in car -discussed that she feels impaired balance -discussed that she defers MRI of the brain   2) Migraines:  -messaged Desma Mcgregor to schedule Vyepti for her -discussed food allergy results -refilled Lyrica and Fioricet -discussed that restarting progesterone may be helpful -continue Lowry Ram -discussed that highest priority is to eliminate gluten Continue Fioricet, which is one of the medications that helps her. Refilled today. She takes this at least once every time she gets a migraine. Advised to  use upon migraine onset and to not use more frequently than q6H during migraine. Refilled Zofran for nausea last week, and this has been helping her. Continue metoprolol which can be helpful in migraine prophylaxis (HR is well controlled). Prescribed ergot nasal spray to try upon migraine initiation- advised no more frequently than 4 sprays per hour (still awaiting on prior auth). Discussed avoiding foods that may trigger migraines.  Failed vypeti, ajovy, emgality, continue Vypeti trial, increase Vyepti to 300mg  -trial of Bioptemizer's magnesium supplement -discussed the elimination diet -prescribed Zavzpret -continue Qulipta daily prn.  -continue re-entry phase of elimination diet- she has noted chili pepper and eggs to be triggers for her -failed feverfew supplement -discussed lumbar puncture Does get some benefit from Maxalt and Fioricet in combination -takes metoprolol -ordered 95 item allergy test -discussed increasing Fioricet or Amitriptyline.  -recommended drinking a glass of water every morning before standing up as her symptoms sound like orthostatic hypotension which could worsen migraines -recommended checking blood pressure daily in supine, sitting, and standing positions and this should help [place] identify if symptoms are from orthostatic hypotension or intracranial hypertension -discussed trying daily water first before trying medication acetazolamide.  -continue vitamin D  -Discussed that I can provide refill for her Lyrica when she needs it.  -topamax was not effective.  -When she gets the migraines her loss of words is getting worse.  -Ordered Vyepti- she will find out cost from her pharmacy. She will bring paperwork for this next visit. She is excited to try it.  -d/c nurtec since switched to maxalt -continue ear piercing. Pain feels more like pressure and less acidic.  -no benefits from Botox.  -continue to track migraines.  -Continue Baclofen for pain relief. Minimize use  of Hydrocodone. She is only taking Lyrica once per day to make it last. Continue Migranal which is helping. Will occipital nerve block -Discussed that Mrs. Rubis's greatest source of happiness is her family. This community will be essential in helping her recover from her chronic pain and to increase her daily activity. Her daughter is also suffering from similar migraines unfortunately.  -Continue ginger, herbs, turmeric, blueberries, eating real food. Continue cutting down sugar. Using local honey is a great alternative. -Provided with a pain relief  journal and discussed that it contains foods and lifestyle tips to naturally help to improve pain. Discussed that these lifestyle strategies are also very good for health unlike some medications which can have negative side effects. Discussed that the act of keeping a journal can be therapeutic and helpful to realize patterns what helps to trigger and alleviate pain.   -Continue Nurtec every other day- may use for breakthrough migraines as well.  -Discussed plan for Vypeti at [facility] infusion center.  Discussed that exercise is one of the most effective treatments for fibromyalgia. This will also help with her obesity. Made goal with Mrs. Furnish to walk outside her home at least once per day, and to garden at least once per day (her favorite activity). Can use elliptical which she has at home on rainy days. The heat has been oppressive and so she has been trying to do the latter more.  Foods to alleviate migraine:  1) dark leafy greens 2) avocado 3) tuna 4) samon and mackerel 5) beans and legumes Supplements that can be helpful: feverfew, B12, and magnesium  Foods to avoid in migraine: 1) Excessive (or irregular timing) coffee 2) red wine 3) aged cheeses 4) chocolate 5) citrus fruits 6) aspartame and other artifical sweeteners 7) yeast 8) MSG (in processed foods) 9) processed and cured meats 10) nuts and certain seeds 11) chicken livers  and other organ meats 12) dairy products like buttermilk, sour cream, and yogurt 13) dried fruits like dates, figs, and raisins 14) garlic 15) onions 16) potato chips 17) pickled foods like olives and sauerkraut 18) some fresh fruits like ripe banana, papaya, red plums, raspberries, kiwi, pineapple 19) tomato-based products  Recommend to keep a migraine diary: rate daily the severity of your headache (1-10) and what foods you eat that day to help determine patterns.    3) Cervical facet arthrosis: Cervical XR normal- discussed results with patient. Pain is worse on left side.   4) Anxiety and depression -Her daughter was recently diagnosed with sarcoma and this is a great source of stress and fear to Mrs. Suzie Portela. It has been a turbulent time for her and this has understandably worsened her symptoms. Discussed benefits of gratitude journaling and she plans to try this.  -discussed her life stressor, her mom's illness -discussed her difficult wean off Pristiq -prescribed zoloft -stop Wellbutrin -prescribed vitamin D -recommended Bioptemizer's magnesium breakthrough -recommended methylated multivitamin -discussed following with Dr. Kieth Brightly.  -increase amitriptyline to 150mg  HS  5) Insomnia:  -continue amitirptyline to 50mg .  -Try to go outside near sunrise -Get exercise during the day.  -Discussed good sleep hygiene: turning off all devices an hour before bedtime.  -Chamomile tea with dinner.  -Can consider over the counter melatonin -discussed magnesium  6) Nausea:  -continue zofran, discussed side effect of constipation -start B6 -discussed scopolamine patch, discussed to place behind ear and lave on 72 hours.    7) Cervical myofascial pain syndrome: Will try some trigger point injections with occipital nerve block next visit.  -increase amitriptyline to 100mg  HS  8) Concern for small bowel obstruction: -stat abdominal MRI ordered given no BM since 1/9.  -recommended to  go to [facility] to get this done stat -discussed that it would be beneficial to go to the ED.   9) IBS: -continue kombucha, yogurt (she makes her own)  10) Trigeminal Neuralgia: - discontinue Carbamazepine since not helping -continue turmeric  11) Lower back pain:  -discussed that Journavx is a highly selective inhibitor  for Nav 1.8, which is specific for pain in the peripheral nervous system, discussed that lidocaine in contrast affects all Nav receptors, discussed loading dose is 2 50mg  tablets and this can be taken 1 hour before food, discussed that then 50mg  can be taken with out without food q12H until pain is tolerable. Discussed that potential side effects are pruritus, muscle spasms, increase blood creatinine, rash. Discussed that we have samples available and we have copay cards available. Discussed that the 2 phase 3 clinicial trials sent to the FDA were for abdominoplasty and bunionectomy, discussed that it has been currently studied for 14 days, discussed that it can reduce efficacy of opioids and benzodiazepines, discussed that it has not be studied in severe hepatic impairment. Discussed that it is contraindicated with fluconazole. Discussed that it can interfere with hormonal contraception up to 28 days after use. Discussed that it can decrease fertility, it has not been studied in pregnancy, that it has been present in animal milk during lactation, not recommended for GFR <15, discussed that outpatient if the medication requires a prior auth the copay should be $30 for at least a 60 day supply. The medication may be more likely to be in stock in [facility] and [facility]. 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 Wynn Banker for [facility] -discussed that she has been told she is no longer a candidate for injections, or surgery of spinal cord stimulator -Discussed Qutenza as an option for neuropathic pain control.  Discussed that this is a capsaicin patch, stronger than capsaicin cream. Discussed that it is currently approved for diabetic peripheral neuropathy and post-herpetic neuralgia, but that it has also shown benefit in treating other forms of neuropathy. Provided patient with link to site to learn more about the patch: https://www.clark.biz/. Discussed that the patch would be placed in office and benefits usually last 3 months. Discussed that unintended exposure to capsaicin can cause severe irritation of eyes, mucous membranes, respiratory tract, and skin, but that Qutenza is a local treatment and does not have the systemic side effects of other nerve medications. Discussed that there may be pain, itching, erythema, and decreased sensory function associated with the application of Qutenza. Side effects usually subside within 1 week. A cold pack of analgesic medications can help with these side effects. Blood pressure can also be increased due to pain associated with administration of the patch.  unchanged. Shallow broad-based disc protrusion L4-5 unchanged. -continue Norco -continue turmeric Turmeric to reduce inflammation--can be used in cooking or taken as a supplement.  Benefits of turmeric:  -Highly anti-inflammatory  -Increases antioxidants  -Improves memory, attention, brain disease  -Lowers risk of heart disease  -May help prevent cancer  -Decreases pain  -Alleviates depression  -Delays aging and decreases risk of chronic disease  -Consume with black pepper to increase absorption    Turmeric Milk Recipe:  1 cup milk  1 tsp turmeric  1 tsp cinnamon  1 tsp grated ginger (optional)  Black pepper (boosts the anti-inflammatory properties of turmeric).  1 tsp honey   12) Stress: -discussed that her daughter is not sleeping well -discussed her mom's health condition and the stress this causes her  13) Menstrual bleeding: -discussed that she should follow-up with OB/GYN  to see if progesterone can be restarted  14. Ataxia: -discussed problem with her motor control when she is walking -handicap placard prescribed as she is unable to ambulate 200 feet  without stopping rest -continue elliptical

## 2024-01-06 ENCOUNTER — Other Ambulatory Visit (HOSPITAL_COMMUNITY): Payer: Self-pay

## 2024-01-06 ENCOUNTER — Telehealth: Payer: Self-pay | Admitting: *Deleted

## 2024-01-06 ENCOUNTER — Other Ambulatory Visit: Payer: Self-pay | Admitting: Physical Medicine and Rehabilitation

## 2024-01-06 ENCOUNTER — Other Ambulatory Visit: Payer: Self-pay

## 2024-01-06 MED ORDER — VYEPTI 100 MG/ML IV SOLN
300.0000 mg | INTRAVENOUS | 3 refills | Status: AC
  Filled 2024-01-06: qty 3, 90d supply, fill #0
  Filled 2024-01-06: qty 3, 34d supply, fill #0
  Filled 2024-03-23 – 2024-04-01 (×2): qty 3, 34d supply, fill #1
  Filled 2024-07-13 – 2024-07-19 (×2): qty 3, 34d supply, fill #2
  Filled 2024-10-25: qty 3, 34d supply, fill #3

## 2024-01-06 NOTE — Progress Notes (Signed)
 Specialty Pharmacy Initial Fill Coordination Note  Dana Bradley is a 53 y.o. female contacted today regarding initial fill of specialty medication(s) Eptinezumab-jjmr [name]   Patient requested Courier to Provider Office   Delivery date: 01/08/24   Verified address: [facility] [address]   Medication will be filled on 4/9.   Patient is aware of $4 copayment.

## 2024-01-06 NOTE — Telephone Encounter (Signed)
 Tiffany calling from [facility]..The prescription for for Vyepti was for 1.2 mls but needs to be written for 3 mls. They will need a new Rx and they need to know where the Rx is to be sent.

## 2024-01-07 ENCOUNTER — Other Ambulatory Visit: Payer: Self-pay

## 2024-01-15 ENCOUNTER — Telehealth: Payer: Self-pay

## 2024-01-15 ENCOUNTER — Ambulatory Visit

## 2024-01-15 VITALS — BP 115/82 | HR 96 | Temp 98.2°F | Resp 18 | Ht 63.0 in | Wt 222.8 lb

## 2024-01-15 DIAGNOSIS — G43711 Chronic migraine without aura, intractable, with status migrainosus: Secondary | ICD-10-CM

## 2024-01-15 DIAGNOSIS — M5416 Radiculopathy, lumbar region: Secondary | ICD-10-CM | POA: Diagnosis not present

## 2024-01-15 MED ORDER — SODIUM CHLORIDE 0.9 % IV SOLN
300.0000 mg | Freq: Once | INTRAVENOUS | Status: AC
  Administered 2024-01-15: 300 mg via INTRAVENOUS
  Filled 2024-01-15: qty 3

## 2024-01-15 NOTE — Telephone Encounter (Signed)
 Prior Auth on   Continuous Glucose Receiver (FREESTYLE LIBRE 3 READER) DEVI   Key [id number]

## 2024-01-15 NOTE — Progress Notes (Signed)
 Assessment   53 y.o. female with  Chief Complaint  Patient presents with  . Back Pain  , consistent with lumbar radiculopathy . Differential diagnosis includes degenerative disc disease without herniated disc.  1. Lumbar radiculopathy  Ambulatory referral to Psychology        Plan    1.  Urine drug screen was not obtained today to establish a baseline for the patient.  2. A [place]  controlled substance database lemmie was performed in the PMP AWARxE  reviewing patient's 12 month history and was appropriate.      Narcotic Violations: none Current Medication Regimen -stable Side effects -minimal   Activity Level- adequate [place] Behavior- nil Last Random Urine Drug Screen- not obtained Last dose reported: Narcotic Contract- not obtained  3. Opioid risk tool score: risk OPIOID RISK TOOL  More data may exist      10/21/2022  Opioid Risk Tool  Family history alcohol  1*  Family history illegal drugs 0  Family history rx drugs 0  Personal history alcohol 0  Personal history illegal drugs 0  Personal history rx drugs 0  Age between 16-45 years 0  History of preadolescent sexual abuse 0  ADD, OCD, bipolar, schizophrenia 2*  Depression 1*  Scoring Total 4  Interpretation Moderate risk for opioid abuse        4. Opioid Medications: None prescribed. Prescribed Norco 7.5 mg 25 tablet by outside provider PRN.      5. Adjuvant Medications: None prescribed  6. Specialized treatments: Patient interested in SCS. Has known right sided disc bulge at T7/8 resulting in mild spinal canal stenosis and moderate right sided flattening of the cord. We discussed potential implications of this including but not limited to: further compression of the spinal cord by SCS leads, radicular pain, numbness, weakness, or nerve damage. Patient verbalized understanding and wishes to proceed with SCS trial to see whether she responds to the therapy.    - Consider left occipital  nerve PNS after SCS trial/implant for severe left-sided occipital neuralgia.  She reports short-term benefit with occipital nerve blocks in the past from outside providers  Discussed with patient that she has multiple pain sites, a large portion of this appears to be myofascial in nature. Discussed symptoms related to fibromyalgia and continuing with individual injections with prior primary provider  7. The patients blood pressure was taken and reported as 110/73  8 .   MRI Thoracic and Lumbar reviewed and discussed with patient  9. No follow-ups on file. Reports provider transitioning in the future     Subjective   History of Present Illness:  Patient was referred to [facility] by Michelene Cower, PA-C  Initial HPI and recap: Dana Bradley presents to [facility] for the evaluation of   Chief Complaint  Patient presents with  . Back Pain     Presents with reports of chronic mid low back pain with bilateral lower extremity radiculopathy.She reports worse pain in left SI joint today with pain radiating to left posterolateral leg. Patient states pain alternates daily and with frequent rgith leg pain extending to the knee. She also reports medical history of Fibromyalgia. Patient has attempted numerous interventional injections to include RFA, SI injections and LESI and is scheduled for epidural steroid injections with outside provider.  Injections are not as effective in managing pain.  She is interested in second evaluation for SCS stimulation. She reports weakness related to pain.  The patient denies associated bowel or  bladder incontinence, saddle anesthesia, or upper or lower extremity weakness.   Interval Update: 01/15/2024 Patient returns to clinic today for discussion regarding spinal cord stimulation therapy.  She continues to report lumbar radicular pain that starts in the upper lumbar spine and radiates from a single point in the midline outward toward  the bilateral hips and down both legs like the Eiffel tower.  She tells me that she has tried multiple injections including medial branch blocks, epidural steroid injections, SI joint injections, trigger point injections, and multiple medications including opioids without significant improvement.  She still an outside spine surgeon who did not recommend additional surgical intervention and instead recommended she pursue spinal cord stimulation therapy.  We had a detailed discussion regarding this today specifically catered toward the known disc bulge at T7/8.  This is causing mild spinal canal stenosis and moderate right-sided cord flattening.  We discussed the implications of this regard to the procedure that placing spinal cord stimulator leads in this region could cause further compression of the spinal cord and potential further nerve injury.  She understands and would like to pursue spinal cord stimulator trial.  I feel that this is reasonable given the short-term nature of the SCS trial and if any potential neurologic complications arise, the trial can be ended prematurely.  She still needs to complete psych evaluation prior to scheduling SCS trial.  Referral was placed for this today.  She also mentions severe left-sided occipital neuralgia pain that she has seen multiple riders for in the past.  She tells me that she has had good short-term benefit from occipital nerve blocks, but has been unable to find any therapy that provides lasting benefit.  We discussed the possibility for peripheral nerve stimulation to her occipital nerves as an alternative for potential long-term benefit.  She is very interested in this option and would like to schedule this after her spinal cord stimulator trial/implant.  Pain rating: 6 out of 10 Pain quality: burning , electric, prickling, numbing, dull, sharp, stabbing, and frequent Aggrevating factors: bending, getting in/out of a seat, lifting, standing, and  walking Alleviating factors: heat, medication:  rest lying down,medication, injections Activities affected by pain: work, Sleep, household chores, yard work, exercise Previous evaluations: [facility]  Physical Therapy/Home Exercise:  PT in the past  Current Pain Medications: - Opioids: Norco 7.5 mg daily PRN  Past Pain Medications:  Previous Interventions/Procedures at [employer]:   Imaging/report (images independently reviewed/interpreted by me and read reviewed):  MRI Thoracic Spine: 03/29/22 FINDINGS:  Alignment: Normal.   Vertebrae: Mild chronic nonspecific bone matter heterogeneity,  similar to the prior study. No destructive lesion, fracture, or  significant marrow edema.   Cord:  Normal signal.   Paraspinal and other soft tissues: Unremarkable.   Disc levels:   At T7-8, a moderate-sized right central disc protrusion results in  mild spinal stenosis and moderately flattens the spinal cord,  unchanged. A small central disc protrusion at T12-L1 is unchanged  and without spinal cord mass effect or stenosis. Assessment of L1-2  on sagittal images demonstrates an unchanged right paracentral disc  herniation without significant stenosis. There is scattered mild to  moderate facet arthrosis throughout the thoracic spine without  neural foraminal stenosis.   IMPRESSION:  Unchanged thoracic disc degeneration, most notable at T7-8 where a  disc protrusion results in mild spinal stenosis and moderate cord  flattening.    MRI Lumbar 01/07/2017 FINDINGS: #  Osseous structures: Vertebral body heights are maintained. No acute fracture  or concerning marrow signal abnormality. #  Alignment: There is a mild curvature to the RIGHT. No significant listhesis appreciated. #  Conus medullaris/cauda equina: Spinal cord signal and termination are within normal limits.   #  Lower thoracic spine: Unremarkable as viewed on the sagittal images only.   #  T12-L1: Unremarkable as viewed on the  sagittal images only. #  L1-L2: Degenerative disc disease with a RIGHT paracentral disc protrusion. There is mild spinal canal narrowing. The spinal canal measures 9 mm in AP dimension. Foramen are patent. #  L2-L3: Unremarkable. #  L3-L4: Unremarkable. #  L4-L5: There is degenerative disc disease with a small central disc protrusion and annular fissure. This mildly effaces the ventral thecal sac. The spinal canal measures 11 mm in AP dimension. Foramen are patent. #  L5-S1: Mild degenerative disc disease. No significant disc herniation, spinal canal or neuroforaminal compromise.   #  Paraspinal tissues: Unremarkable.   #  Additional comments: None.      IMPRESSION: Mild degenerative disc disease with posterior central disc protrusions as described above at L1-L2 and L4-L5 resulting in mild spinal canal narrowing. No significant foraminal encroachment.   REVIEW OF SYSTEMS:  A complete 13 point review of systems was performed and was noncontributory except as noted in history of present illness, see intake form from today's examination for full details   Review of Systems  Musculoskeletal:  Positive for back pain and myalgias.    Allergic/Immunologic: positive for none, negative for anaphylaxis and angioedema  Denies new numbness or weakness Denies current chest pain, SOB, Oversedation     Health Status  Allergies:  Allergies  Allergen Reactions  . Aspirin Swelling  . Cephalosporins Rash and Other    rash  . Compazine Other  . Demerol Hives  . Divalproex Sodium Shortness Of Breath    W/ n/v  . Duloxetine Other    Suicidal idealation  . Duloxetine Hcl Other    Suicidal ideations and has homicidal thoughts per patient  . Gadolinium Derivatives Other    Pt was unable to breath  Other reaction(s): Other (See Comments)  Pt was unable to breath  . Iodinated Diagnostic Agents Other and Shortness Of Breath  . Keflex Rash  . Latex Itching and Other    Other reaction(s):  Unknown  . Morphine Nausea And Vomiting    Other reaction(s): Vomiting  . Prochlorperazine Other    Panic attack  Other reaction(s): Other (See Comments)  . Tramadol  Other  . Trazodone Other and Shortness Of Breath    Other reaction(s): Other (See Comments)  . Bacitracin-Neomycin-Polymyxin Rash  . Bacitracin-Polymyx-Neo-Hc Rash  . Ibuprofen Other and Rash    Blisters in mouth.  Blisters in mouth.  Blisters in mouth.  . Nsaids Other    Blisters in mouth; can take 1 ibuprofen 2x a month  Other reaction(s): Other (See Comments), Unknown  Blisters in mouth; can take 1 ibuprofen 2x a month  . Shellfish-Derived Products Other    Other reaction(s): Unknown  Other reaction(s): Unknown  Other reaction(s): Unknown  . Sulfa Antibiotics Rash and Other    rash  rash  Other reaction(s): Unknown  rash  rash  Other reaction(s): Unknown  rash    Other reaction(s): Unknown  rash  rash  Other reaction(s): Unknown  rash  . Sulfasalazine Rash  . Tomato Hives    Tongue will blister  . Trazodone Hcl Other    Current medications: Medication List reviewed and updated today. Current Home  Medications   Medication Sig Last Dose  amitriptyline  HCl (ELAVIL ) 150 mg tablet Take one tablet (150 mg dose) by mouth at bedtime. Taking  amitriptyline  HCl (ELAVIL ) 75 mg tablet Take one tablet (75 mg dose) by mouth. Patient not taking: Reported on 01/15/2024 Not Taking  atorvastatin  (LIPITOR) 40 mg tablet Take by mouth. Taking  baclofen  (LIORESAL ) 10 mg tablet Take one half tablet to one tablet (5-10 mg dose) by mouth 3 (three) times a day. Taking  buPROPion  (WELLBUTRIN  SR) 100 mg 12 hr tablet Take one tablet (100 mg dose) by mouth 2 (two) times daily. Patient not taking: Reported on 01/15/2024 Not Taking  butalbital -acetaminophen -caffeine  (ESGIC) 50-325-40 mg per tablet Take one tablet by mouth. Patient taking differently: Take one tablet by mouth every 4 (four) hours as needed. Med Note   carBAMazepine  (TEGRETOL  XR) 100 mg 12 hr tablet Take one tablet (100 mg dose) by mouth daily. Patient not taking: Reported on 01/15/2024 Not Taking  cyanocobalamin  (VITAMIN B-12) 1000 mcg/mL injection INJECT 1 ML (1,000 MCG) INTO THE MUSCLE ONCE FOR 1 DOSE Patient not taking: Reported on 01/15/2024 Not Taking  dexlansoprazole  (DEXILANT ,KAPIDEX) 60 mg capsule Take one capsule (60 mg dose) by mouth daily. Taking  diclofenac  sodium (VOLTAREN ) 1 % gel Apply two g topically 4 (four) times a day as needed. Taking  dicyclomine  (BENTYL ) 10 mg capsule Take ten mg by mouth 30 (thirty) minutes before meals & at bedtime. Taking  doxycycline  hyclate (VIBRA -TABS) 100 mg tablet Take one tablet (100 mg dose) by mouth 2 (two) times daily. Patient not taking: Reported on 01/15/2024 Not Taking  ergocalciferol  (VITAMIN D2) 50,000 units CAPS capsule Take one capsule (50,000 Units dose) by mouth. Patient not taking: Reported on 01/15/2024 Not Taking  HYDROcodone -Acetaminophen  10-300 MG TABS  Taking  LORazepam  (ATIVAN ) 1 mg tablet Take one half tablet (0.5 mg dose) by mouth 2 (two) times a day as needed. Patient not taking: Reported on 01/15/2024 Not Taking  lubiprostone  (AMITIZA ) 8 mcg capsule Take one capsule (8 mcg dose) by mouth 2 (two) times a day with meals. Taking  meclizine  HCl (ANTIVERT ) 12.5 mg tablet Take one tablet (12.5 mg dose) by mouth with meals as needed. Taking  medroxyPROGESTERone  (PROVERA ) 5 MG tablet Take one tablet (5 mg dose) by mouth daily. Taking  metformin  (GLUCOPHAGE ) 1000 MG tablet Take one tablet (1,000 mg dose) by mouth 2 (two) times a day with meals. Taking  metoprolol  succinate (TOPROL -XL) 100 mg 24 hr tablet Take one and a half tablets (150 mg dose) by mouth daily. Taking  metoprolol  tartrate (LOPRESSOR ) 50 mg tablet TAKE 1 AND 1/2 TABLET BY MOUTH 2 TIMES DAILY Patient not taking: Reported on 01/15/2024 Not Taking  NA sulfate-potassium sulfate-magnesium sulfate (SUPREP BOWEL PREP)  17.5-3.13-1.6 GM/177ML Take by mouth. Patient not taking: Reported on 01/15/2024 Not Taking  Naloxone  HCl 4 MG/0.1ML LIQD nasal spray one spray by Nasal route as needed.   omeprazole  (PRILOSEC) 40 mg capsule Take one capsule (40 mg dose) by mouth daily. Patient not taking: Reported on 01/15/2024 Not Taking  ondansetron  (ZOFRAN ) 4 mg tablet Take one tablet (4 mg dose) by mouth every 8 (eight) hours as needed. Taking  OZEMPIC , 0.25 OR 0.5 MG/DOSE, 2 MG/3ML SOPN pen injection Inject into the skin. Patient not taking: Reported on 01/15/2024 Not Taking  pancrelipase , lipase-protease-amylase, (CREON  12000) 12000 units DR capsule Take 2 capsules by mouth with the first bite of each meal and take 1 capsule before snacks. (Max of 8 capsules per  day). Patient not taking: Reported on 01/15/2024 Not Taking  pantoprazole  sodium (PROTONIX ) 40 mg tablet Take one tablet (40 mg dose) by mouth. Patient not taking: Reported on 01/15/2024 Not Taking  polyethylene glycol-electrolytes (NULYTELY,TRILYTE,GAVILYTE-N) 420 g solution Prepare according to package instructions. Starting at 5:00 PM: Drink one 8 oz glass of mixture every 15 minutes until you finish half of the jug. Five hours prior to procedure, drink 8 oz glass of mixture every 15 minutes until it is all gone. Make sure you do not drink anything 4 hours prior to your procedure. Patient not taking: Reported on 01/15/2024 Not Taking  pregabalin  (LYRICA ) 150 mg capsule Take one capsule (150 mg dose) by mouth 3 (three) times a day. Taking  QULIPTA  30 MG TABS Take 30 mg by mouth daily as needed. Taking  Rimegepant Sulfate  75 MG TBDP Take 1 tablet by mouth every other day. Patient not taking: Reported on 01/15/2024 Not Taking  scopolamine  (TRANSDERM-SCOP 1.5 MG) 1 MG/3DAYS patch Place one and a half patches onto the skin every third day. Patient not taking: Reported on 01/15/2024 Not Taking  semaglutide ,0.25 or 0.5 mg/dose, (OZEMPIC ) 2 mg/3 mL SOPN pen injection Inject 0.5  mg into the skin once a week. Taking  sertraline  (ZOLOFT ) 25 mg tablet Take one tablet (25 mg dose) by mouth. Patient not taking: Reported on 01/15/2024 Not Taking  VALERIAN PO Take by mouth. Taking  VITAMIN D , CHOLECALCIFEROL, PO Take by mouth. Taking  VYEPTI  100 MG/ML injection Inject 1 mL (100 mg dose) into the vein every 3 (three) months. Taking      Problem list: Problem list reviewed and updated at today's visit Patient Active Problem List  Diagnosis  . Lumbar radiculopathy  . Fibromyalgia     Histories  Family History: family history was reviewed and updated today's visit, . History reviewed. No pertinent family history.   Social History: Social history was reviewed and updated today's visit.       Objective   Vitals:   01/15/24 1454  BP: 110/73  Pulse: 111  SpO2: 96%  Weight: 223 lb (101.2 kg)  Height: 5' 3 (1.6 m)    Body mass index is 39.5 kg/m.  Physical Exam:  GENERAL: well developed, well nourished female patient awake, alert, and oriented x3, in no acute distress. DERMATOLOGIC: intact, no rashes present, normal turgor HEENT: normocephalic, atraumatic; CERVICAL SPINE: supple, non- tender, full range of motion, no masses palpated,  No pain with palpation of the cervical spinous processes. There is some pain with palpation of the cervical facets. Positive Spurling sign. Negative Lhermitte's sign. Negative Hoffmann's. Diffuse cervical and paracervical muscle spasm  Respiratory:  Respirations are non-labored, Symmetrical chest wall expansion.   Cardiovascular:  Normal rate, Normal peripheral perfusion.    Integumentary:  Warm, Dry, No rash.     Cognition and Speech:  Oriented, Speech clear and coherent.   Psychiatric:  Cooperative, Appropriate mood & affect, Normal judgment.  Demonstrates no pressured speech. Normal affect. Normal cognition.  Back:  Limited by concordant pain in the lumbar midline and R>L paravertebral area. Facet loading maneuvers are positive  bilaterally. No PSIS tenderness. Negative Patrick's test. Positive straight leg raising signs at 45.  Noted paravertebral and lumbar muscle spasm. Neuro: mental status, speech normal, alert and oriented x3, cranial nerves 2-12 intact, muscle tone and strength normal and symmetric, reflexes normal and symmetric and walks with a slow legged gait  and station; + paresthesias.  Documentation for time-based billing:  Total time spent  of date of service was 45 minutes.  Patient care activities included preparing to see the patient such as reviewing the patient record, obtaining and/or reviewing separately obtained history, performing a medically appropriate history and physical examination, counseling and educating the patient, family, and/or caregiver, and documenting clinical information in the electronic or other health record.    Joyce Brunson- Ollison,DNP, ACAGNP- [facility] 01/15/2024, 5:04 PM       Patients pain was assessed, documented as positive, unless stated in the HPI, and followup plan has been documented in the plan. Patients medications list was reviewed and updated if applicable. Body Mass Index (BMI) screening was documented and if the patients BMI was greater than 25 or less than 18.5, the patient was asked to follow up with their PCP for a full assessment regarding weight management. If Fluoroscopy was used during a procedure, the radiation exposure time and number of images were documented within the record.  Urine Drug Screening is a widely available and familiar form of testing used in order to make informed choices with regard to clinical decision making in chronic pain management patients.  Opioid analgesics must be prescribed with discernment and their appropriate use must be assessed periodically.  Urine Drug Screening can help track patient compliance, identify substance abuse and misuse, and prevent potentially dangerous drug-drug interactions.  Today, an 11-panel point of care  immunoassay and alcohol screen was performed in order to accomplish this and to help determine the best course of action for today's visit.  Immunoassay urine drug screening, like all tests, is not without its limitations with regard to specificity and sensitivity.  For example, 1) We are unable to detect fentanyl  with our currently available immunoassay technology.  2) We are unable to determine the specific medications present and their metabolites within the drug classes.  3) The Opiate class represents morphine, hydrocodone , and hydromorphone.  Differentiation between these cannot be determined.  4) Likewise, the Methadone  class represents methadone  and tapentadol .   5) The Oxycodone  class represents oxycodone  and oxymorphone.  The Benzodiazepine class contains all commonly prescribed in that category.  Due to these and other limitations encountered with this form of screening, confirmatory testing may also be deemed to be medically necessary and ordered to assist in making decisions regarding this patient's long-term care.  If an opioid medication was prescribed the patient has also been informed of: -Serious adverse effects of opioids, including potentially fatal respiratory depression and development of a potentially serious lifelong opioid use disorder that can cause distress and inability to fulfill major role obligations. -Common effects of opioids, such as constipation, dry mouth, nausea, vomiting, drowsiness, confusion, tolerance, physical dependence, and withdrawal symptoms when stopping opioids. To prevent constipation associated with opioid use, patient advised to increase hydration and fiber intake and to maintain or increase physical activity. Stool softeners or laxatives are recommended prn. -Effects that opioids might have on ability to safely operate a vehicle, particularly when opioids are initiated, when dosages are increased, or when other central nervous system depressants, such as  benzodiazepines or alcohol, are used concurrently.  -Increased risks for opioid use disorder, respiratory depression, and death at higher dosages, along with the importance of taking only the amount of opioids prescribed, i.e., not taking more opioids or taking them more often. -Increased risks for respiratory depression when opioids are taken with benzodiazepines, other sedatives, alcohol, illicit drugs such as heroin, or other opioids. -Risks to household members and other individuals if opioids are intentionally or unintentionally shared with  others for whom they are not prescribed, including the possibility that others might experience overdose at the same or at lower dosage than prescribed for the patient, and that young children are susceptible to unintentional ingestion. We discussed storage of opioids in a secure, preferably locked location and options for safe disposal of unused opioids. -Planned use of precautions to reduce risks, including use of prescription drug monitoring program information and urine drug testing. We discussed use of naloxone  use for overdose reversal. We agreed to review risks and benefits of COT at least every 3 months. -Cognitive limitations might interfere with management of opioid therapy and, if so, determined whether a caregiver can responsibly co-manage medication therapy. Safer alternative medication use was discussed with both the patient and caregiver.    The above plan and management options were discussed at length with patient. Patient is in agreement with the above and verbalized understanding. It will be communicated with the referring physician via electronic record, fax, or mail.  I have reviewed the information contained in this note and personally verified its accuracy.  I obtained or reviewed the history of present illness and personally performed the physical exam. Joyce Brunson- Ollison ACAGNP-BC      Timed Billing: I have personally spent 30  minutes involved in face-to-face and non-face-to-face activities for this patient on the day of the visit.  Professional time spent includes reviewing the medical record, evaluating the patient, independently interpreting results, coordinating care, and documenting.  I discussed and answered questions regarding the patient's diagnosis along with reviewing imaging findings with the patient, utilizing anatomic model for education regarding etiology of pain and interventional therapies, as well as the risks vs. potential benefits of various treatment options for each specific etiology of pain.         ..         *Some images could not be shown.

## 2024-01-15 NOTE — Progress Notes (Signed)
 Diagnosis: Migraine  Provider:  Praveen Mannam MD  Procedure: IV Infusion  IV Type: Peripheral, IV Location: R Antecubital  Vyepti (Eptinezumab-jjmr), Dose: 300 mg  Infusion Start Time: 1324  Infusion Stop Time: 1400  Post Infusion IV Care: Peripheral IV Discontinued  Discharge: Condition: Good, Destination: Home . AVS Declined  Performed by:  Star East, LPN

## 2024-02-03 ENCOUNTER — Encounter: Payer: Self-pay | Admitting: Family Medicine

## 2024-02-03 DIAGNOSIS — E119 Type 2 diabetes mellitus without complications: Secondary | ICD-10-CM

## 2024-02-03 DIAGNOSIS — R739 Hyperglycemia, unspecified: Secondary | ICD-10-CM

## 2024-02-03 MED ORDER — OZEMPIC (0.25 OR 0.5 MG/DOSE) 2 MG/3ML ~~LOC~~ SOPN: 0.5000 mg | PEN_INJECTOR | SUBCUTANEOUS | 1 refills | Status: DC

## 2024-02-16 ENCOUNTER — Other Ambulatory Visit: Payer: Self-pay | Admitting: Physical Medicine and Rehabilitation

## 2024-02-16 DIAGNOSIS — G43001 Migraine without aura, not intractable, with status migrainosus: Secondary | ICD-10-CM

## 2024-02-24 ENCOUNTER — Other Ambulatory Visit: Payer: Self-pay

## 2024-02-24 DIAGNOSIS — F4323 Adjustment disorder with mixed anxiety and depressed mood: Secondary | ICD-10-CM

## 2024-02-24 DIAGNOSIS — F32A Depression, unspecified: Secondary | ICD-10-CM

## 2024-02-24 DIAGNOSIS — F4001 Agoraphobia with panic disorder: Secondary | ICD-10-CM

## 2024-02-27 ENCOUNTER — Encounter: Payer: Self-pay | Admitting: Physical Medicine and Rehabilitation

## 2024-02-27 ENCOUNTER — Other Ambulatory Visit (HOSPITAL_COMMUNITY): Payer: Self-pay

## 2024-03-10 ENCOUNTER — Other Ambulatory Visit: Payer: Self-pay | Admitting: Physical Medicine and Rehabilitation

## 2024-03-11 ENCOUNTER — Ambulatory Visit: Admitting: Dermatology

## 2024-03-11 ENCOUNTER — Encounter: Payer: Self-pay | Admitting: Dermatology

## 2024-03-11 VITALS — BP 104/76 | HR 111

## 2024-03-11 DIAGNOSIS — D485 Neoplasm of uncertain behavior of skin: Secondary | ICD-10-CM

## 2024-03-11 DIAGNOSIS — R229 Localized swelling, mass and lump, unspecified: Secondary | ICD-10-CM

## 2024-03-11 DIAGNOSIS — D225 Melanocytic nevi of trunk: Secondary | ICD-10-CM

## 2024-03-11 DIAGNOSIS — R222 Localized swelling, mass and lump, trunk: Secondary | ICD-10-CM | POA: Diagnosis not present

## 2024-03-11 DIAGNOSIS — Z808 Family history of malignant neoplasm of other organs or systems: Secondary | ICD-10-CM | POA: Diagnosis not present

## 2024-03-11 DIAGNOSIS — D492 Neoplasm of unspecified behavior of bone, soft tissue, and skin: Secondary | ICD-10-CM | POA: Diagnosis not present

## 2024-03-11 DIAGNOSIS — L820 Inflamed seborrheic keratosis: Secondary | ICD-10-CM

## 2024-03-11 NOTE — Patient Instructions (Addendum)
 Important Information  Due to recent changes in healthcare laws, you may see results of your pathology and/or laboratory studies on MyChart before the doctors have had a chance to review them. We understand that in some cases there may be results that are confusing or concerning to you. Please understand that not all results are received at the same time and often the doctors may need to interpret multiple results in order to provide you with the best plan of care or course of treatment. Therefore, we ask that you please give us  2 business days to thoroughly review all your results before contacting the office for clarification. Should we see a critical lab result, you will be contacted sooner.   If You Need Anything After Your Visit  If you have any questions or concerns for your doctor, please call our main line at [phone] If no one answers, please leave a voicemail as directed and we will return your call as soon as possible. Messages left after 4 pm will be answered the following business day.   You may also send us  a message via MyChart. We typically respond to MyChart messages within 1-2 business days.  For prescription refills, please ask your pharmacy to contact our office. Our fax number is [phone].  If you have an urgent issue when the clinic is closed that cannot wait until the next business day, you can page your doctor at the number below.    Please note that while we do our best to be available for urgent issues outside of office hours, we are not available 24/7.   If you have an urgent issue and are unable to reach us , you may choose to seek medical care at your doctor's office, retail clinic, urgent care center, or emergency room.  If you have a medical emergency, please immediately call 911 or go to the emergency department. In the event of inclement weather, please call our main line at [phone] for an update on the status of any delays or  closures.  Dermatology Medication Tips: Please keep the boxes that topical medications come in in order to help keep track of the instructions about where and how to use these. Pharmacies typically print the medication instructions only on the boxes and not directly on the medication tubes.   If your medication is too expensive, please contact our office at [phone] or send us  a message through MyChart.   We are unable to tell what your co-pay for medications will be in advance as this is different depending on your insurance coverage. However, we may be able to find a substitute medication at lower cost or fill out paperwork to get insurance to cover a needed medication.   If a prior authorization is required to get your medication covered by your insurance company, please allow us  1-2 business days to complete this process.  Drug prices often vary depending on where the prescription is filled and some pharmacies may offer cheaper prices.  The website www.goodrx.com contains coupons for medications through different pharmacies. The prices here do not account for what the cost may be with help from insurance (it may be cheaper with your insurance), but the website can give you the price if you did not use any insurance.  - You can print the associated coupon and take it with your prescription to the pharmacy.  - You may also stop by our office during regular business hours and pick up a GoodRx coupon card.  - If  you need your prescription sent electronically to a different pharmacy, notify our office through [facility] or by phone at [phone]    Skin Education :   I counseled the patient regarding the following: Sun screen (SPF 30 or greater) should be applied during peak UV exposure (between 10am and 2pm) and reapplied after exercise or swimming.  The ABCDEs of melanoma were reviewed with the patient, and the importance of monthly self-examination of moles was emphasized.  Should any moles change in shape or color, or itch, bleed or burn, pt will contact our office for evaluation sooner then their interval appointment.  Plan: Sunscreen Recommendations I recommended a broad spectrum sunscreen with a SPF of 30 or higher. I explained that SPF 30 sunscreens block approximately 97 percent of the sun's harmful rays. Sunscreens should be applied at least 15 minutes prior to expected sun exposure and then every 2 hours after that as long as sun exposure continues. If swimming or exercising sunscreen should be reapplied every 45 minutes to an hour after getting wet or sweating. One ounce, or the equivalent of a shot glass full of sunscreen, is adequate to protect the skin not covered by a bathing suit. I also recommended a lip balm with a sunscreen as well. Sun protective clothing can be used in lieu of sunscreen but must be worn the entire time you are exposed to the sun's rays.  Patient Handout: Wound Care for Skin Biopsy Site  Taking Care of Your Skin Biopsy Site  Proper care of the biopsy site is essential for promoting healing and minimizing scarring. This handout provides instructions on how to care for your biopsy site to ensure optimal recovery.  1. Cleaning the Wound:  Clean the biopsy site daily with gentle soap and water. Gently pat the area dry with a clean, soft towel. Avoid harsh scrubbing or rubbing the area, as this can irritate the skin and delay healing.  2. Applying Aquaphor and Bandage:  After cleaning the wound, apply a thin layer of Aquaphor ointment to the biopsy site. Cover the area with a sterile bandage to protect it from dirt, bacteria, and friction. Change the bandage daily or as needed if it becomes soiled or wet.  3. Continued Care for One Week:  Repeat the cleaning, Aquaphor application, and bandaging process daily for one week following the biopsy procedure. Keeping the wound clean and moist during this initial healing period will help  prevent infection and promote optimal healing.  4. Massaging Aquaphor into the Area:  ---After one week, discontinue the use of bandages but continue to apply Aquaphor to the biopsy site. ----Gently massage the Aquaphor into the area using circular motions. ---Massaging the skin helps to promote circulation and prevent the formation of scar tissue.   Additional Tips:  Avoid exposing the biopsy site to direct sunlight during the healing process, as this can cause hyperpigmentation or worsen scarring. If you experience any signs of infection, such as increased redness, swelling, warmth, or drainage from the wound, contact your healthcare provider immediately. Follow any additional instructions provided by your healthcare provider for caring for the biopsy site and managing any discomfort. Conclusion:  Taking proper care of your skin biopsy site is crucial for ensuring optimal healing and minimizing scarring. By following these instructions for cleaning, applying Aquaphor, and massaging the area, you can promote a smooth and successful recovery. If you have any questions or concerns about caring for your biopsy site, don't hesitate to contact your healthcare  provider for guidance.  Liquid nitrogen was applied for 10-12 seconds to the skin lesion and the expected blistering or scabbing reaction explained. Do not pick at the area. Patient reminded to expect hypopigmented scars from the procedure. Return if lesion fails to fully resolve.  Patient Handout: Wound Care for Skin Biopsy Site  Taking Care of Your Skin Biopsy Site  Proper care of the biopsy site is essential for promoting healing and minimizing scarring. This handout provides instructions on how to care for your biopsy site to ensure optimal recovery.  1. Cleaning the Wound:  Clean the biopsy site daily with gentle soap and water. Gently pat the area dry with a clean, soft towel. Avoid harsh scrubbing or rubbing the area, as this can  irritate the skin and delay healing.  2. Applying Aquaphor and Bandage:  After cleaning the wound, apply a thin layer of Aquaphor ointment to the biopsy site. Cover the area with a sterile bandage to protect it from dirt, bacteria, and friction. Change the bandage daily or as needed if it becomes soiled or wet.  3. Continued Care for One Week:  Repeat the cleaning, Aquaphor application, and bandaging process daily for one week following the biopsy procedure. Keeping the wound clean and moist during this initial healing period will help prevent infection and promote optimal healing.  4. Massaging Aquaphor into the Area:  ---After one week, discontinue the use of bandages but continue to apply Aquaphor to the biopsy site. ----Gently massage the Aquaphor into the area using circular motions. ---Massaging the skin helps to promote circulation and prevent the formation of scar tissue.   Additional Tips:  Avoid exposing the biopsy site to direct sunlight during the healing process, as this can cause hyperpigmentation or worsen scarring. If you experience any signs of infection, such as increased redness, swelling, warmth, or drainage from the wound, contact your healthcare provider immediately. Follow any additional instructions provided by your healthcare provider for caring for the biopsy site and managing any discomfort. Conclusion:  Taking proper care of your skin biopsy site is crucial for ensuring optimal healing and minimizing scarring. By following these instructions for cleaning, applying Aquaphor, and massaging the area, you can promote a smooth and successful recovery. If you have any questions or concerns about caring for your biopsy site, don't hesitate to contact your healthcare provider for guidance.

## 2024-03-11 NOTE — Progress Notes (Signed)
   New Patient Visit   Subjective  Dana Bradley is a 53 y.o. female who presents for the following: concerned about a couple spots. No hx of skin cancer.  Family hx of NMSC and MM(uncle). Patient is accompanied by her husband.  The patient has spots, moles and lesions to be evaluated, some may be new or changing.  The following portions of the chart were reviewed this encounter and updated as appropriate: medications, allergies, medical history  Review of Systems:  No other skin or systemic complaints except as noted in HPI or Assessment and Plan.  Objective  Well appearing patient in no apparent distress; mood and affect are within normal limits.   A focused examination was performed of the following areas: back  Relevant exam findings are noted in the Assessment and Plan.  Mid Back 6mm irregularly pigmented brown papule    Assessment & Plan   INFLAMED SEBORRHEIC KERATOSIS Exam: Erythematous keratotic or waxy stuck-on papule or plaque.  Symptomatic, irritating, patient would like treated.  Benign-appearing.  Call clinic for new or changing lesions.   Prior to procedure, discussed risks of blister formation, small wound, skin dyspigmentation, or rare scar following treatment. Recommend Vaseline ointment to treated areas while healing.  Destruction Procedure Note Destruction method: cryotherapy   Informed consent: discussed and consent obtained   Lesion destroyed using liquid nitrogen: Yes   Outcome: patient tolerated procedure well with no complications   Post-procedure details: wound care instructions given   Locations: back # of Lesions Treated: 1   NEOPLASM OF UNCERTAIN BEHAVIOR OF SKIN (2) Mid Back Skin / nail biopsy Type of biopsy: tangential   Informed consent: discussed and consent obtained   Timeout: patient name, date of birth, surgical site, and procedure verified   Procedure prep:  Patient was prepped and draped in usual sterile fashion Prep type:   Isopropyl alcohol Anesthesia: the lesion was anesthetized in a standard fashion   Anesthetic:  1% lidocaine  w/ epinephrine  1-100,000 buffered w/ 8.4% NaHCO3 Instrument used: DermaBlade   Hemostasis achieved with: aluminum chloride   Outcome: patient tolerated procedure well   Post-procedure details: sterile dressing applied and wound care instructions given   Dressing type: petrolatum and bandage   Specimen 1 - Surgical pathology Differential Diagnosis: r/o DN vs MM vs other  Check Margins: No Mid Back Return for excision of possible cyst  LIKELY EPIDERMAL INCLUSION CYST- right mid back Exam: Subcutaneous nodule at right mid back  Benign-appearing. Exam most consistent with an epidermal inclusion cyst. Discussed that a cyst is a benign growth that can grow over time and sometimes get irritated or inflamed. Recommend observation if it is not bothersome. Discussed option of surgical excision to remove it if it is growing, symptomatic, or other changes noted. Please call for new or changing lesions so they can be evaluated.  Return for return for excision.  I, Haig Levan, Surg Tech III, am acting as scribe for Deneise Finlay, MD.   Documentation: I have reviewed the above documentation for accuracy and completeness, and I agree with the above.  Deneise Finlay, MD

## 2024-03-12 LAB — SURGICAL PATHOLOGY

## 2024-03-14 ENCOUNTER — Ambulatory Visit: Payer: Self-pay | Admitting: Dermatology

## 2024-03-15 NOTE — Telephone Encounter (Signed)
 Spoke with pt gave her bx results and treatment recommendation

## 2024-03-15 NOTE — Telephone Encounter (Signed)
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   ----- Message ----- From: Interface, Lab In Three Zero One Sent: 03/12/2024   4:09 PM EDT To: Deneise Finlay, MD

## 2024-03-23 ENCOUNTER — Other Ambulatory Visit (HOSPITAL_COMMUNITY): Payer: Self-pay

## 2024-03-31 ENCOUNTER — Other Ambulatory Visit (HOSPITAL_COMMUNITY): Payer: Self-pay

## 2024-03-31 ENCOUNTER — Other Ambulatory Visit: Payer: Self-pay

## 2024-03-31 ENCOUNTER — Telehealth: Payer: Self-pay | Admitting: Family Medicine

## 2024-03-31 ENCOUNTER — Encounter: Payer: Self-pay | Admitting: Family Medicine

## 2024-03-31 DIAGNOSIS — F32A Depression, unspecified: Secondary | ICD-10-CM

## 2024-03-31 DIAGNOSIS — K219 Gastro-esophageal reflux disease without esophagitis: Secondary | ICD-10-CM

## 2024-03-31 DIAGNOSIS — K2930 Chronic superficial gastritis without bleeding: Secondary | ICD-10-CM

## 2024-03-31 DIAGNOSIS — R1013 Epigastric pain: Secondary | ICD-10-CM

## 2024-03-31 MED ORDER — DEXILANT 60 MG PO CPDR: 60.0000 mg | DELAYED_RELEASE_CAPSULE | Freq: Every day | ORAL | 1 refills | Status: DC

## 2024-03-31 NOTE — Progress Notes (Signed)
 Pt referral reordered per her preference -  I do not know if her insurance will cover this clinic/location and pt notified of such.    ICD-10-CM   1. Depression, unspecified depression type  F32.A Ambulatory referral to Psychiatry    CANCELED: Ambulatory referral to Psychiatry    2. Panic disorder with agoraphobia  F40.01 Ambulatory referral to Psychiatry    3. Adjustment disorder with mixed anxiety and depressed mood  F43.23 Ambulatory referral to Psychiatry   sig increase in family stressors with cancer diagnosis, health issues for patient, caregiver to many family members, chronic pain

## 2024-03-31 NOTE — Telephone Encounter (Signed)
 Copied from CRM [id number]. Topic: Referral - Question >> Mar 31, 2024 10:35 AM DeAngela L wrote: Reason for CRM: Patient would like a referral to [facility] Patient this is where the patient would like to be referred to she did not have a good experience at the [place] location this is why she would like to try the [place] location The patient has already researched and the [place] location accepts her insurance  [facility] [address], [place], [place] [address] Phone: [phone]  Pt num [phone] (H)

## 2024-03-31 NOTE — Telephone Encounter (Signed)
 PT requesting dexilant  rx GI MD moved practices Hx of GERD, gastritis, tried and failed multiple other PPI including prevacid, omeprazole  and nexium and requires dexilant  to manage symptoms and diagnosis.  Rx sent to pt pharmacy and she was notified by reply message  back on MyChart     ICD-10-CM   1. Dyspepsia  R10.13 DEXILANT  60 MG capsule    Ambulatory referral to Gastroenterology    2. Chronic superficial gastritis, presence of bleeding unspecified  K29.30 DEXILANT  60 MG capsule    Ambulatory referral to Gastroenterology    3. Gastroesophageal reflux disease, unspecified whether esophagitis present  K21.9 DEXILANT  60 MG capsule    Ambulatory referral to Gastroenterology      Psych referrals done on prior order encounter - OV with PCP reviewed going back to May 2024 and all w/o psych/BH diagnosis addressed at her OV - so screenings not done recently     11/10/2023    4:06 PM 10/09/2023    2:16 PM 10/06/2023   11:59 AM  Depression screen PHQ 2/9  Decreased Interest 3 0 1  Down, Depressed, Hopeless 3 0 1  PHQ - 2 Score 6 0 2  Altered sleeping 3    Tired, decreased energy 3    Change in appetite 3    Feeling bad or failure about yourself  3    Trouble concentrating 3    Moving slowly or fidgety/restless 0    Suicidal thoughts 0    PHQ-9 Score 21        05/12/2023    2:54 PM 02/13/2023    9:45 AM 01/07/2023    3:28 PM 12/09/2022   11:06 AM  GAD 7 : Generalized Anxiety Score  Nervous, Anxious, on Edge 3 3 2 3   Control/stop worrying 3 3 3 3   Worry too much - different things 3 3 2 3   Trouble relaxing 2 3 2 3   Restless 2 1 2 1   Easily annoyed or irritable 3 2 3 3   Afraid - awful might happen 2 2 2 3   Total GAD 7 Score 18 17 16 19   Anxiety Difficulty Extremely difficult Extremely difficult Very difficult Extremely difficult   Michelene Cower, PA-C

## 2024-03-31 NOTE — Telephone Encounter (Signed)
 Copied from CRM [id number]. Topic: Clinical - Medication Refill >> Mar 31, 2024 10:41 AM DeAngela L wrote: Medication: dexalant this medication was prescribed by an ENT Dr previously that's no longer available to get the refill with the prescriber, and the patient is asking for a refill due to having heartburn concerns again   Has the patient contacted their pharmacy? Yes  (Agent: If no, request that the patient contact the pharmacy for the refill. If patient does not wish to contact the pharmacy document the reason why and proceed with request.) (Agent: If yes, when and what did the pharmacy advise?)  This is the patient's preferred pharmacy:  [facility] - [place], [place] - [address] [place] [address] Phone: [phone] Fax: [phone]   Is this the correct pharmacy for this prescription? Yes  If no, delete pharmacy and type the correct one.   Has the prescription been filled recently? No   Is the patient out of the medication? No   Has the patient been seen for an appointment in the last year OR does the patient have an upcoming appointment? Yes   Can we respond through MyChart? Yes   Agent: Please be advised that Rx refills may take up to 3 business days. We ask that you follow-up with your pharmacy.

## 2024-03-31 NOTE — Addendum Note (Signed)
 Addended by: Darrah Dredge on: 03/31/2024 01:04 PM   Modules accepted: Orders

## 2024-03-31 NOTE — Telephone Encounter (Signed)
 Referral placed just need you to sign off if ok also, will you give her a refill on the GERD med?

## 2024-04-01 ENCOUNTER — Other Ambulatory Visit: Payer: Self-pay

## 2024-04-01 ENCOUNTER — Encounter: Payer: Self-pay | Admitting: Physical Medicine and Rehabilitation

## 2024-04-01 DIAGNOSIS — G4733 Obstructive sleep apnea (adult) (pediatric): Secondary | ICD-10-CM | POA: Diagnosis not present

## 2024-04-01 NOTE — Progress Notes (Signed)
 Specialty Pharmacy Refill Coordination Note  Dana Bradley is a 53 y.o. female assessed today regarding refills of clinic administered specialty medication(s) Eptinezumab -jjmr (Vyepti )   Clinic requested Courier to Provider Office   Delivery date: 04/12/24   Verified address: [facility] [address], [address]   Medication will be filled on 04/09/24.

## 2024-04-02 ENCOUNTER — Other Ambulatory Visit: Payer: Self-pay

## 2024-04-03 DIAGNOSIS — G4733 Obstructive sleep apnea (adult) (pediatric): Secondary | ICD-10-CM | POA: Diagnosis not present

## 2024-04-06 ENCOUNTER — Other Ambulatory Visit (HOSPITAL_COMMUNITY): Payer: Self-pay

## 2024-04-09 ENCOUNTER — Other Ambulatory Visit: Payer: Self-pay

## 2024-04-12 ENCOUNTER — Encounter: Attending: Physical Medicine and Rehabilitation | Admitting: Physical Medicine and Rehabilitation

## 2024-04-12 ENCOUNTER — Encounter: Payer: Self-pay | Admitting: Physical Medicine and Rehabilitation

## 2024-04-12 VITALS — BP 125/83 | HR 105 | Ht 63.0 in | Wt 222.0 lb

## 2024-04-12 DIAGNOSIS — Z7409 Other reduced mobility: Secondary | ICD-10-CM | POA: Insufficient documentation

## 2024-04-12 DIAGNOSIS — G43711 Chronic migraine without aura, intractable, with status migrainosus: Secondary | ICD-10-CM | POA: Insufficient documentation

## 2024-04-12 DIAGNOSIS — M797 Fibromyalgia: Secondary | ICD-10-CM | POA: Insufficient documentation

## 2024-04-12 DIAGNOSIS — R32 Unspecified urinary incontinence: Secondary | ICD-10-CM | POA: Diagnosis not present

## 2024-04-12 MED ORDER — PREGABALIN 150 MG PO CAPS: 150.0000 mg | ORAL_CAPSULE | Freq: Three times a day (TID) | ORAL | 3 refills | Status: AC | PRN

## 2024-04-12 MED ORDER — BUTALBITAL-APAP-CAFFEINE 50-325-40 MG PO TABS: 1.0000 | ORAL_TABLET | ORAL | 3 refills | Status: AC | PRN

## 2024-04-12 MED ORDER — QULIPTA 60 MG PO TABS: 60.0000 mg | ORAL_TABLET | Freq: Every day | ORAL | 3 refills | Status: AC

## 2024-04-12 NOTE — Addendum Note (Signed)
 Addended by: LORILEE SVEN SQUIBB on: 04/12/2024 11:19 AM   Modules accepted: Orders

## 2024-04-12 NOTE — Patient Instructions (Addendum)
 [facility] [employer] Healthy Nerves and Feet  Constipation:  -Provided list of following foods that help with constipation and highlighted a few: 1) prunes- contain high amounts of fiber.  2) apples- has a form of dietary fiber called pectin that accelerates stool movement and increases beneficial gut bacteria 3) pears- in addition to fiber, also high in fructose and sorbitol which have laxative effect 4) figs- contain an enzyme ficin which helps to speed colonic transit 5) kiwis- contain an enzyme actinidin that improves gut motility and reduces constipation 6) oranges- rich in pectin (like apples) 7) grapefruits- contain a flavanol naringenin which has a laxative effect 8) vegetables- rich in fiber and also great sources of folate, vitamin C, and K 9) artichoke- high in inulin, prebiotic great for the microbiome 10) chicory- increases stool frequency and softness (can be added to coffee) 11) rhubarb- laxative effect 12) sweet potato- high fiber 13) beans, peas, and lentils- contain both soluble and insoluble fiber 14) chia seeds- improves intestinal health and gut flora 15) flaxseeds- laxative effect 16) whole grain rye bread- high in fiber 17) oat bran- high in soluble and insoluble fiber 18) kefir- softens stools -recommended to try at least one of these foods every day.  -drink 6-8 glasses of water per day -walk regularly, especially after meals.

## 2024-04-12 NOTE — Progress Notes (Addendum)
 Subjective:    Patient ID: Dana Bradley, female    DOB: 08-04-1971, 53 y.o.   MRN: [id number]  HPI   1) Chronic Migraine:  -has had a migraine every day between 4:30 and 6pm -she took Fioricet at [place] [facility] and these typically happen -light sensitivity is severe -gone when she wakes in the morning -not eating a lot of gluten, bread -she needs an appointment at the infusion center -has only had 3 non-migraines days since Thanksgiving -stopped progesterone in March, she feels that this was helping in combination with the different medications -Dana Bradley us  a 53 year old woman who presents for f/u of her chronic migraine  -discussed her food allergy test results -took the Nurtec and the Fioricet last night and her migraine got better -she thinks she been dehydrated -the Nolberto has helped -she thinks the Vyepti  has helped  -migraines have been decent over the last few weeks -she thinks Vyepti  helps -she is trying not to be stressed -[name] went to the cardiologist today and they are doing a cardiac MRI, and her fatigue is worse, she was told her heart is not pumping well. --she would like to try Zavzpret  -she has ordered Bioptemizer's magnesium -she has been having stress due to her mother's Alzheimer's disease -she has been very fatigued during the day -she cannot be in light -on Monday she had to wear an eye mask in a darkened room. -Migraines will not get better unless she takes the Fioricet with the maxaalt but it puts her to sleepy -she feels she is getting a migraine every day -the Fioricet and Nurtec or Maxaalt together give her relief, but not complete relief.  -the days run into each other and its hard to remember the differences between them -she has been stressed in caring for her mother -she has noted recently that when she lays flat she feels fine but as soon as she sits up she feels an excruciating migraine -migraine continues to worsen from sitting to standing  position -her daughter has similar symptoms but felt better after starting acetazolamide for presumed diagnosis of intracranial hypertension. She asks whether she may benefit from this medication as well.  Terrible stomach spasms and diarrhea. 12 hours later debilitating migraine. It usually starts in the afternoon. She feels that this may be part of her prodrome. She also has terrible vertigo that is getting worse. The Fiorcet helps a lot, the Migronal helps with the frontal headache. These make the pain bearable.  -having migraines close to every 24H -she is not functional -she is not sure if Maxaalt is helping. She is not taking with the Nurtec. It is helping some but she is not sure how much. The fioricet alone helps by putting her to sleep.  -she has not heart any news about the Vypeti infusion. The medication was approved  -has been having more dizziness.  -the pain has been worst since she had the occipital nerve blocks.  -she knows it is stress related -she feels a pain radiating down her arm as well. It also runs into her head and into her face.  -Sometimes her whole left side is fizzy.  -she has tried Amitriptyline .  -have been awful since December. Had a migraine from Dec 1st throughout 9th.  -the day before she was incapacitated.  -Vyepti  has been approved -The nurtec and the Fiorocet help together to break the migraine.  -she needs a refill of her Fioricet and nausea, she finds this helpful.  -  Nurtec- she is still working with the company to get this improved -she asks about prodrome and postdrome (she experiences cramping and diarrhea).  -on Sunday she closed out a 56 hour migraines. -she has been trying to follow ketogenic diet.  -Nurtec helping- 5 days in a row without a migraines.  -she feels if she could take it as a rescue medication in addition to every other day this may help.   2) Vertigo -Symptoms are worsening.  -She feels that this may be leading up to the  vertigo.  -She is interested in vestibular therapy.  -she had a heating pad on the shoulder and arm.   3) Cervical myofasical pain syndrome: -Muscles of neck are very tight, pain is worse on the left side.  -She did not find spinal injections helpful, they made the pain worse.   4) Insomnia: she sleeps in chunks -she is willing to increase amitriptyline  to 50mg .   5) Cognitive impairments: -she loves to cook and noticed she has been making more mistakes -she thinks the headaches are a problem.  6) Pain radiating from back into legs -feels twitches -worse at night -legs kick out at night.   7) IBS -terrible in the night -2am on Sunday. -horrible pain -often with diarrhea and improves symptoms after she has diarrhea.  -cramping.  -last normal BM is 1/9 -having abdominal distension -magnesium citrate does not help -miralax multiple times per day do not help  8) Menstrual bleeding: -has been present since 12/11 -she was started on progesterone a few days before that date  9) depression: -she has dry mouth will wellbutrin  and did not find much efficacy. She asked if there is an alternative medication she can try -she had tried Zoloft  in the past with benefits -she is going to pick up the Zoloft  today and try it tonight -she would be willing to take vitamin D   10) Low back pain: -radiates into her feet -not a good surgical candidate -she feels she will be wheelchair bound soon  11. Stress: -she is stressed that [name] as a pleural lesion  Dana Bradley is a 53 year old woman who presents for follow-up of her chronic migraine. Her average pain is 6/10 and pain right now is 6/10. Her pain is intermittent, sharp, burning, stabbing, tingling, and aching.   She recently had 7 days without migraine. She is not sure what was associated with this. She had a pretty terrible bounceback.  She picked up the Migranal  nasal spray. Our CMA explained how it works.   Her pain doctor is  going to stop prescribing the Lyrica  and she needs to find a new doctor to prescribe this.   12) Impaired balance: -she was trying to trim trees the other day and had to keep catching herself with the pruners -she is also started to having sharp pains on the right side as well as the left side  13) obesity:  -started Ozempic  two months ago   Pain Inventory Average Pain 7 Pain Right Now 8 My pain is intermittent, constant, sharp, burning, stabbing, and aching  In the last 24 hours, has pain interfered with the following? General activity 5 Relation with others 7 Enjoyment of life 7 What TIME of day is your pain at its worst? morning , daytime, evening, and night Sleep (in general) Poor  Pain is worse with: unsure Pain improves with: medication, TENS, and heat Relief from Meds: 5    Family History  Problem Relation Age of  Onset   Depression Mother    Hypertension Mother    Cancer Mother        Skin   Hyperlipidemia Mother    Anxiety disorder Mother    Migraines Mother    Alcohol abuse Father    Depression Father    Stroke Father    Heart disease Father    Hypertension Father    Anxiety disorder Father    Depression Sister    Hyperlipidemia Sister    Diabetes Sister    Hypertension Sister    Polycystic ovary syndrome Sister    Bipolar disorder Sister    Anxiety disorder Sister    Migraines Sister    Depression Sister    Hypertension Sister    Anxiety disorder Sister    Migraines Sister    Breast cancer Maternal Grandmother 6   Cancer Maternal Grandmother 81       Breast   Thyroid  disease Maternal Grandmother    Arthritis Maternal Grandmother    Hyperlipidemia Maternal Grandmother    Aneurysm Maternal Grandfather    Hypertension Maternal Grandfather    Heart disease Maternal Grandfather    Alzheimer's disease Paternal Grandmother    Heart attack Paternal Grandfather    Hypertension Paternal Grandfather    COPD Paternal Grandfather    Heart disease  Paternal Grandfather    Migraines Daughter    Other Daughter        leomyoa scaroma   Migraines Daughter    Migraines Son    Alzheimer's disease Other    Bladder Cancer Neg Hx    Kidney cancer Neg Hx    Social History   Socioeconomic History   Marital status: Married    Spouse name: brian   Number of children: 3   Years of education: Not on file   Highest education level: Associate degree: academic program  Occupational History   Occupation: disbled    Comment: not able  Tobacco Use   Smoking status: Former    Current packs/day: 0.00    Average packs/day: 2.0 packs/day for 3.0 years (6.0 ttl pk-yrs)    Types: Cigarettes    Start date: 12/10/1989    Quit date: 12/10/1992    Years since quitting: 31.3   Smokeless tobacco: Never   Tobacco comments:    quit 25 years ago  Vaping Use   Vaping status: Never Used  Substance and Sexual Activity   Alcohol use: No    Comment: socially   Drug use: No   Sexual activity: Not Currently    Partners: Male    Birth control/protection: Surgical  Other Topics Concern   Not on file  Social History Narrative   Lives at home with her husband and 2 of her children   Right handed   Caffeine : 0-2 cups daily   Social Drivers of Health   Financial Resource Strain: High Risk (08/29/2017)   Overall Financial Resource Strain (CARDIA)    Difficulty of Paying Living Expenses: Very hard  Food Insecurity: Food Insecurity Present (08/29/2017)   Hunger Vital Sign    Worried About Running Out of Food in the Last Year: Often true    Ran Out of Food in the Last Year: Often true  Transportation Needs: Unmet Transportation Needs (08/29/2017)   PRAPARE - Administrator, Civil Service (Medical): Yes    Lack of Transportation (Non-Medical): Yes  Physical Activity: Inactive (08/29/2017)   Exercise Vital Sign    Days of Exercise per Week: 0 days  Minutes of Exercise per Session: 0 min  Stress: Stress Concern Present (08/29/2017)    [employer] of Occupational Health - Occupational Stress Questionnaire    Feeling of Stress : Rather much  Social Connections: Unknown (10/16/2022)   Received from [facility]   Social Network    Social Network: Not on file   Past Surgical History:  Procedure Laterality Date   ABLATION     Uterine   BREAST BIOPSY Left    2014 U/S bx fibroadenoma   BREAST BIOPSY Left 05/28/2023   lr us  bx 3:00 heart clip   BREAST BIOPSY Left 05/28/2023   US  LT BREAST BX W LOC DEV 1ST LESION IMG BX SPEC US  GUIDE 05/28/2023 [facility]   CARDIAC CATHETERIZATION     with ablation   COLONOSCOPY N/A 09/26/2022   Procedure: COLONOSCOPY;  Surgeon: Therisa Bi, MD;  Location: [facility] ENDOSCOPY;  Service: Gastroenterology;  Laterality: N/A;   COLONOSCOPY WITH PROPOFOL  N/A 05/17/2015   Procedure: COLONOSCOPY WITH PROPOFOL ;  Surgeon: Lamar ONEIDA Holmes, MD;  Location: [facility] ENDOSCOPY;  Service: Endoscopy;  Laterality: N/A;   COLONOSCOPY WITH PROPOFOL  N/A 03/20/2020   Procedure: COLONOSCOPY WITH PROPOFOL ;  Surgeon: Therisa Bi, MD;  Location: [facility] ENDOSCOPY;  Service: Gastroenterology;  Laterality: N/A;   COLONOSCOPY WITH PROPOFOL  N/A 09/25/2022   Procedure: COLONOSCOPY WITH PROPOFOL ;  Surgeon: Therisa Bi, MD;  Location: [facility] ENDOSCOPY;  Service: Gastroenterology;  Laterality: N/A;   COLONOSCOPY WITH PROPOFOL  N/A 10/02/2022   Procedure: COLONOSCOPY WITH PROPOFOL ;  Surgeon: Therisa Bi, MD;  Location: [facility] ENDOSCOPY;  Service: Gastroenterology;  Laterality: N/A;   COLONOSCOPY WITH PROPOFOL  N/A 11/13/2022   Procedure: COLONOSCOPY WITH PROPOFOL ;  Surgeon: Therisa Bi, MD;  Location: [facility] ENDOSCOPY;  Service: Gastroenterology;  Laterality: N/A;   ESOPHAGOGASTRODUODENOSCOPY N/A 05/17/2015   Procedure: ESOPHAGOGASTRODUODENOSCOPY (EGD);  Surgeon: Lamar ONEIDA Holmes, MD;  Location: [facility] ENDOSCOPY;  Service: Endoscopy;  Laterality: N/A;   ESOPHAGOGASTRODUODENOSCOPY N/A 09/25/2022   Procedure:  ESOPHAGOGASTRODUODENOSCOPY (EGD);  Surgeon: Therisa Bi, MD;  Location: [facility] ENDOSCOPY;  Service: Gastroenterology;  Laterality: N/A;   ESOPHAGOGASTRODUODENOSCOPY N/A 09/26/2022   Procedure: ESOPHAGOGASTRODUODENOSCOPY (EGD);  Surgeon: Therisa Bi, MD;  Location: [facility] ENDOSCOPY;  Service: Gastroenterology;  Laterality: N/A;   KNEE ARTHROSCOPY     spg     6/18   Tibial Tubercle Bypass Right 1998   TUBAL LIGATION  10/01/1999   Past Surgical History:  Procedure Laterality Date   ABLATION     Uterine   BREAST BIOPSY Left    2014 U/S bx fibroadenoma   BREAST BIOPSY Left 05/28/2023   lr us  bx 3:00 heart clip   BREAST BIOPSY Left 05/28/2023   US  LT BREAST BX W LOC DEV 1ST LESION IMG BX SPEC US  GUIDE 05/28/2023 [facility]   CARDIAC CATHETERIZATION     with ablation   COLONOSCOPY N/A 09/26/2022   Procedure: COLONOSCOPY;  Surgeon: Therisa Bi, MD;  Location: [facility] ENDOSCOPY;  Service: Gastroenterology;  Laterality: N/A;   COLONOSCOPY WITH PROPOFOL  N/A 05/17/2015   Procedure: COLONOSCOPY WITH PROPOFOL ;  Surgeon: Lamar ONEIDA Holmes, MD;  Location: [facility] ENDOSCOPY;  Service: Endoscopy;  Laterality: N/A;   COLONOSCOPY WITH PROPOFOL  N/A 03/20/2020   Procedure: COLONOSCOPY WITH PROPOFOL ;  Surgeon: Therisa Bi, MD;  Location: [facility] ENDOSCOPY;  Service: Gastroenterology;  Laterality: N/A;   COLONOSCOPY WITH PROPOFOL  N/A 09/25/2022   Procedure: COLONOSCOPY WITH PROPOFOL ;  Surgeon: Therisa Bi, MD;  Location: [facility] ENDOSCOPY;  Service: Gastroenterology;  Laterality: N/A;   COLONOSCOPY WITH PROPOFOL  N/A 10/02/2022   Procedure: COLONOSCOPY  WITH PROPOFOL ;  Surgeon: Therisa Bi, MD;  Location: [facility] ENDOSCOPY;  Service: Gastroenterology;  Laterality: N/A;   COLONOSCOPY WITH PROPOFOL  N/A 11/13/2022   Procedure: COLONOSCOPY WITH PROPOFOL ;  Surgeon: Therisa Bi, MD;  Location: [facility] ENDOSCOPY;  Service: Gastroenterology;  Laterality: N/A;   ESOPHAGOGASTRODUODENOSCOPY N/A 05/17/2015   Procedure:  ESOPHAGOGASTRODUODENOSCOPY (EGD);  Surgeon: Lamar ONEIDA Holmes, MD;  Location: [facility] ENDOSCOPY;  Service: Endoscopy;  Laterality: N/A;   ESOPHAGOGASTRODUODENOSCOPY N/A 09/25/2022   Procedure: ESOPHAGOGASTRODUODENOSCOPY (EGD);  Surgeon: Therisa Bi, MD;  Location: [facility] ENDOSCOPY;  Service: Gastroenterology;  Laterality: N/A;   ESOPHAGOGASTRODUODENOSCOPY N/A 09/26/2022   Procedure: ESOPHAGOGASTRODUODENOSCOPY (EGD);  Surgeon: Therisa Bi, MD;  Location: [facility] ENDOSCOPY;  Service: Gastroenterology;  Laterality: N/A;   KNEE ARTHROSCOPY     spg     6/18   Tibial Tubercle Bypass Right 1998   TUBAL LIGATION  10/01/1999   Past Medical History:  Diagnosis Date   Acute postoperative pain 04/07/2017   Anxiety    Bursitis    Chronic fatigue 12/12/2017   Chronic fatigue syndrome    Colitis 2021   Diabetes mellitus without complication (HCC)    Edema leg 05/02/2015   Fibromyalgia    GERD (gastroesophageal reflux disease)    IBS (irritable bowel syndrome)    Knee pain, bilateral 12/21/2008   Qualifier: Diagnosis of  By: Gladis FNP, Nykedtra     Lumbar discitis    Migraines    Osteoarthritis    Right hand pain 04/10/2015   [facility] has done nerve conduction studies and ruled out carpal tunnel.    Sleep apnea    Spinal stenosis    SVT (supraventricular tachycardia) (HCC)    Vertigo    Vitamin D  deficiency 05/01/2016   Ht 5' 3 (1.6 m)   Wt 222 lb (100.7 kg)   BMI 39.33 kg/m   Opioid Risk Score:   Fall Risk Score:  `1  Depression screen PHQ 2/9     11/10/2023    4:06 PM 10/09/2023    2:16 PM 10/06/2023   11:59 AM 08/01/2023   11:03 AM 06/30/2023    2:25 PM 05/12/2023    2:54 PM 02/13/2023    9:43 AM  Depression screen PHQ 2/9  Decreased Interest 3 0 1 0 1 2 3   Down, Depressed, Hopeless 3 0 1 0 1 3 3   PHQ - 2 Score 6 0 2 0 2 5 6   Altered sleeping 3     2 3   Tired, decreased energy 3     2 3   Change in appetite 3     2 1   Feeling bad or failure about yourself  3     2 0   Trouble concentrating 3     2 1   Moving slowly or fidgety/restless 0     0 0  Suicidal thoughts 0     0 0  PHQ-9 Score 21     15 14   Difficult doing work/chores      Very difficult Very difficult   Review of Systems  Constitutional: Negative.   HENT: Negative.    Eyes: Negative.   Respiratory: Negative.    Cardiovascular: Negative.   Gastrointestinal: Negative.   Endocrine: Negative.   Genitourinary: Negative.   Musculoskeletal:  Positive for back pain.       Left side of body from head to toes  Skin: Negative.   Allergic/Immunologic: Negative.   Neurological:  Positive for dizziness and headaches.  Hematological: Negative.   Psychiatric/Behavioral: Negative.  All other systems reviewed and are negative.      Objective:   Physical Exam Gen: no distress, normal appearing HEENT: oral mucosa pink and moist, NCAT, dark glasses on eyes to help with light sensivity Cardio: Reg rate Chest: normal effort, normal rate of breathing Abd: soft, non-distended Ext: no edema Psych: pleasant, normal affect Skin: intact Neuro: Alert and oriented without focal deficits     Assessment & Plan:  Dana Bradley is a 53 year old woman who presents for f/u with chronic intractable migraines s/p numerous treatments, severe fibromyalgia, and IBS, and nausea, and vertigo.     1) Vertigo: In the future, restart for cervical myofascial pain syndrome: myofascial release, postural correction, stretching and strengthening of the muscles of the neck and upper back, development of HEP. Conitnue heating pads to muscles of upper back and neck. She is doing HEP.  -referred for vestibular therapy.  -discussed current symptoms.  -discussed response to meclizine  -discussed worsening symptoms when in car -discussed that she feels impaired balance -discussed that she defers MRI of the brain -discussed that this has resolved   2) Migraines:  -messaged Millicent Blanch to schedule Vyepti  for her -discussed food  allergy results -recommended methylated B vitamin -refilled Lyrica  and Fioricet -discussed that restarting progesterone may be helpful -increase Qullipta to 60mg  daily -recommended avoiding eggs, dairy, gluten, eating a lot of fruits and vegetables -discussed that highest priority is to eliminate gluten Continue Fioricet, which is one of the medications that helps her. Refilled today. She takes this at least once every time she gets a migraine. Advised to use upon migraine onset and to not use more frequently than q6H during migraine. Refilled Zofran  for nausea last week, and this has been helping her. Continue metoprolol  which can be helpful in migraine prophylaxis (HR is well controlled). Prescribed ergot nasal spray to try upon migraine initiation- advised no more frequently than 4 sprays per hour (still awaiting on prior auth). Discussed avoiding foods that may trigger migraines.  Failed vypeti, ajovy, emgality , continue Vypeti trial, increase Vyepti  to 300mg  -trial of Bi[employer]em[employer]'s magnesium supplement -discussed the elimination diet -prescribed Zavzpret  -continue Qulipta  daily prn.  -continue re-entry phase of elimination diet- she has noted chili pepper and eggs to be triggers for her -failed feverfew supplement -discussed lumbar puncture Does get some benefit from Maxalt  and Fioricet in combination -takes metoprolol  -ordered 95 item allergy test -discussed increasing Fioricet or Amitriptyline .  -recommended drinking a glass of water every morning before standing up as her symptoms sound like orthostatic hypotension which could worsen migraines -recommended checking blood pressure daily in supine, sitting, and standing positions and this should help us  identify if symptoms are from orthostatic hypotension or intracranial hypertension -discussed trying daily water first before trying medication acetazolamide.  -continue vitamin D   -Discussed that I can provide refill for her Lyrica  when  she needs it.  -topamax  was not effective.  -When she gets the migraines her loss of words is getting worse.  -Ordered Vyepti - she will find out cost from her pharmacy. She will bring paperwork for this next visit. She is excited to try it.  -d/c nurtec since switched to maxalt  -continue ear piercing. Pain feels more like pressure and less acidic.  -no benefits from Botox.  -continue to track migraines.  -Continue Baclofen  for pain relief . Minimize use of Hydrocodone . She is only taking Lyrica  once per day to make it last. Continue Migranal  which is helping. Will occipital nerve block -Discussed that Dana Bradley's  greatest source of happiness is her family. This community will be essential in helping her recover from her chronic pain and to increase her daily activity. Her daughter is also suffering from similar migraines unfortunately.  -Continue ginger, herbs, turmeric, blueberries, eating real food. Continue cutting down sugar. Using local honey is a great alternative. -Provided with a pain relief  journal and discussed that it contains foods and lifestyle tips to naturally help to improve pain. Discussed that these lifestyle strategies are also very good for health unlike some medications which can have negative side effects. Discussed that the act of keeping a journal can be therapeutic and helpful to realize patterns what helps to trigger and alleviate pain.   -Continue Nurtec every other day- may use for breakthrough migraines as well.  -Discussed plan for Vypeti at [facility] infusion center.  Discussed that exercise is one of the most effective treatments for fibromyalgia. This will also help with her obesity. Made goal with Dana Bradley to walk outside her home at least once per day, and to garden at least once per day (her favorite activity). Can use elliptical which she has at home on rainy days. The heat has been oppressive and so she has been trying to do the latter more.  Foods to  alleviate migraine:  1) dark leafy greens 2) avocado 3) tuna 4) samon and mackerel 5) beans and legumes Supplements that can be helpful: feverfew, B12, and magnesium  Foods to avoid in migraine: 1) Excessive (or irregular timing) coffee 2) red wine 3) aged cheeses 4) chocolate 5) citrus fruits 6) aspartame and other artifical sweeteners 7) yeast 8) MSG (in processed foods) 9) processed and cured meats 10) nuts and certain seeds 11) chicken livers and other organ meats 12) dairy products like buttermilk, sour cream, and yogurt 13) dried fruits like dates, figs, and raisins 14) garlic 15) onions 16) potato chips 17) pickled foods like olives and sauerkraut 18) some fresh fruits like ripe banana, papaya, red plums, raspberries, kiwi, pineapple 19) tomato-based products  Recommend to keep a migraine diary: rate daily the severity of your headache (1-10) and what foods you eat that day to help determine patterns.    3) Cervical facet arthrosis: Cervical XR normal- discussed results with patient. Pain is worse on left side.   4) Anxiety and depression -Her daughter was recently diagnosed with sarcoma and this is a great source of stress and fear to Dana Bradley. It has been a turbulent time for her and this has understandably worsened her symptoms. Discussed benefits of gratitude journaling and she plans to try this.  -discussed her life stressor, her mom's illness -discussed her difficult wean off Pristiq  -prescribed zoloft  -stop Wellbutrin  -prescribed vitamin D  -recommended Bi[employer]emizer's magnesium breakthrough -recommended methylated multivitamin -discussed following with Dr. Corina.  -increase amitriptyline  to 150mg  HS  5) Insomnia:  -continue amitirptyline to 50mg .  -Try to go outside near sunrise -Get exercise during the day.  -Discussed good sleep hygiene: turning off all devices an hour before bedtime.  -Chamomile tea with dinner.  -Can consider over the counter  melatonin -discussed magnesium  6) Nausea:  -continue zofran , discussed side effect of constipation -start B6 -discussed scopolamine  patch, discussed to place behind ear and lave on 72 hours.    7) Cervical myofascial pain syndrome: Will try some trigger point injections with occipital nerve block next visit.  -increase amitriptyline  to 100mg  HS  8) Concern for small bowel obstruction: -stat abdominal MRI ordered given no BM since  1/9.  -recommended to go to [facility] [facility] to get this done stat -discussed that it would be beneficial to go to the ED.   9) IBS: -continue kombucha, yogurt (she makes her own)  10) Trigeminal Neuralgia: - discontinue Carbamazepine  since not helping -continue turmeric  11) Lower back pain:  -discussed that Journavx  is a highly selective inhibitor for Nav 1.8, which is specific for pain in the peripheral nervous system, discussed that lidocaine  in contrast affects all Nav receptors, discussed loading dose is 2 50mg  tablets and this can be taken 1 hour before food, discussed that then 50mg  can be taken with out without food q12H until pain is tolerable. Discussed that potential side effects are pruritus, muscle spasms, increase blood creatinine, rash. Discussed that we have samples available and we have copay cards available. Discussed that the 2 phase 3 clinicial trials sent to the FDA were for abdominoplasty and bunionectomy, discussed that it has been currently studied for 14 days, discussed that it can reduce efficacy of opioids and benzodiazepines, discussed that it has not be studied in severe hepatic impairment. Discussed that it is contraindicated with fluconazole . Discussed that it can interfere with hormonal contraception up to 28 days after use. Discussed that it can decrease fertility, it has not been studied in pregnancy, that it has been present in animal milk during lactation, not recommended for GFR <15, discussed that outpatient if the medication requires a  prior auth the copay should be $30 for at least a 60 day supply. The medication may be more likely to be in stock in [facility] and [facility]. 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 Carilyn for [facility] -discussed that she has been told she is no longer a candidate for injections, or surgery of spinal cord stimulator -Discussed Qutenza as an option for neuropathic pain control. Discussed that this is a capsaicin patch, stronger than capsaicin cream. Discussed that it is currently approved for diabetic peripheral neuropathy and post-herpetic neuralgia, but that it has also shown benefit in treating other forms of neuropathy. Provided patient with link to site to learn more about the patch: https://www.clark.biz/. Discussed that the patch would be placed in office and benefits usually last 3 months. Discussed that unintended exposure to capsaicin can cause severe irritation of eyes, mucous membranes, respiratory tract, and skin, but that Qutenza is a local treatment and does not have the systemic side effects of other nerve medications. Discussed that there may be pain, itching, erythema, and decreased sensory function associated with the application of Qutenza. Side effects usually subside within 1 week. A cold pack of analgesic medications can help with these side effects. Blood pressure can also be increased due to pain associated with administration of the patch.  unchanged. Shallow broad-based disc protrusion L4-5 unchanged. -continue Norco -continue turmeric Turmeric to reduce inflammation--can be used in cooking or taken as a supplement.  Benefits of turmeric:  -Highly anti-inflammatory  -Increases antioxidants  -Improves memory, attention, brain disease  -Lowers risk of heart disease  -May help prevent cancer  -Decreases pain  -Alleviates depression  -Delays aging and decreases risk of chronic  disease  -Consume with black pepper to increase absorption    Turmeric Milk Recipe:  1 cup milk  1 tsp turmeric  1 tsp cinnamon  1 tsp grated ginger (optional)  Black pepper (boosts the anti-inflammatory properties of turmeric).  1 tsp honey   12) Stress: -discussed  that her daughter is not sleeping well -discussed her mom's health condition and the stress this causes her  35) Menstrual bleeding: -discussed that she should follow-up with OB/GYN to see if progesterone can be restarted  14. Ataxia: -discussed problem with her motor control when she is walking -handicap placard prescribed as she is unable to ambulate 200 feet without stopping rest -continue elliptical  15. Hydrocephalus: -discussed that given impaired balance, urinary incontinence, and wide based gait it would be beneficial to get an MRI to check for this  16. Constipation:  -discussed that if mag citrate does not work I usually recommend an MRI -recommended cucumber juice since celery juice -Provided list of following foods that help with constipation and highlighted a few: 1) prunes- contain high amounts of fiber.  2) apples- has a form of dietary fiber called pectin that accelerates stool movement and increases beneficial gut bacteria 3) pears- in addition to fiber, also high in fructose and sorbitol which have laxative effect 4) figs- contain an enzyme ficin which helps to speed colonic transit 5) kiwis- contain an enzyme actinidin that improves gut motility and reduces constipation 6) oranges- rich in pectin (like apples) 7) grapefruits- contain a flavanol naringenin which has a laxative effect 8) vegetables- rich in fiber and also great sources of folate, vitamin C, and K 9) artichoke- high in inulin, prebiotic great for the microbiome 10) chicory- increases stool frequency and softness (can be added to coffee) 11) rhubarb- laxative effect 12) sweet potato- high fiber 13) beans, peas, and  lentils- contain both soluble and insoluble fiber 14) chia seeds- improves intestinal health and gut flora 15) flaxseeds- laxative effect 16) whole grain rye bread- high in fiber 17) oat bran- high in soluble and insoluble fiber 18) kefir- softens stools -recommended to try at least one of these foods every day.  -drink 6-8 glasses of water per day -walk regularly, especially after meals.

## 2024-04-16 ENCOUNTER — Ambulatory Visit

## 2024-04-16 VITALS — BP 107/73 | HR 109 | Temp 98.5°F | Resp 12 | Ht 63.0 in | Wt 222.2 lb

## 2024-04-16 DIAGNOSIS — G43711 Chronic migraine without aura, intractable, with status migrainosus: Secondary | ICD-10-CM

## 2024-04-16 LAB — DRUG TOX MONITOR 1 W/CONF, ORAL FLD
Amobarbital: NEGATIVE ng/mL (ref <10)
Amphetamines: NEGATIVE ng/mL (ref <10)
Barbiturates: POSITIVE ng/mL — AB (ref <10)
Benzodiazepines: NEGATIVE ng/mL (ref <0.50)
Buprenorphine: NEGATIVE ng/mL (ref <0.10)
Butalbital: 199 ng/mL — ABNORMAL HIGH (ref <10)
Cocaine: NEGATIVE ng/mL (ref <5.0)
Fentanyl: NEGATIVE ng/mL (ref <0.10)
Heroin Metabolite: NEGATIVE ng/mL (ref <1.0)
MARIJUANA: NEGATIVE ng/mL (ref <2.5)
MDMA: NEGATIVE ng/mL (ref <10)
Meprobamate: NEGATIVE ng/mL (ref <2.5)
Methadone: NEGATIVE ng/mL (ref <5.0)
Nicotine Metabolite: NEGATIVE ng/mL (ref <5.0)
Opiates: NEGATIVE ng/mL (ref <2.5)
Pentobarbital: NEGATIVE ng/mL (ref <10)
Phencyclidine: NEGATIVE ng/mL (ref <10)
Phenobarbital: NEGATIVE ng/mL (ref <10)
Secobarbital: NEGATIVE ng/mL (ref <10)
Tapentadol: NEGATIVE ng/mL (ref <5.0)
Tramadol: NEGATIVE ng/mL (ref <5.0)
Zolpidem: NEGATIVE ng/mL (ref <5.0)

## 2024-04-16 LAB — DRUG TOX ALC METAB W/CON, ORAL FLD: Alcohol Metabolite: NEGATIVE ng/mL (ref <25)

## 2024-04-16 MED ORDER — SODIUM CHLORIDE 0.9 % IV SOLN
300.0000 mg | Freq: Once | INTRAVENOUS | Status: AC
  Administered 2024-04-16: 300 mg via INTRAVENOUS
  Filled 2024-04-16: qty 3

## 2024-04-16 NOTE — Progress Notes (Signed)
 Diagnosis: Migraine   Provider:  Lonna Coder MD  Procedure: IV Infusion  IV Type: Peripheral, IV Location: R Antecubital  Vyepti  (Eptinezumab -jjmr), Dose: 300 mg  Infusion Start Time: 1330  Infusion Stop Time: 1401  Post Infusion IV Care: Peripheral IV Discontinued  Discharge: Condition: Good, Destination: Home . AVS Declined  Performed by:  Leita FORBES Miles, LPN

## 2024-04-19 ENCOUNTER — Other Ambulatory Visit: Payer: Self-pay

## 2024-04-21 ENCOUNTER — Encounter: Payer: Self-pay | Admitting: Dermatology

## 2024-04-23 ENCOUNTER — Other Ambulatory Visit (HOSPITAL_BASED_OUTPATIENT_CLINIC_OR_DEPARTMENT_OTHER): Payer: Self-pay

## 2024-04-23 ENCOUNTER — Ambulatory Visit
Admission: RE | Admit: 2024-04-23 | Discharge: 2024-04-23 | Disposition: A | Discharge status: Home / Self Care | Source: Ambulatory Visit | Attending: Physical Medicine and Rehabilitation | Admitting: Physical Medicine and Rehabilitation

## 2024-04-23 DIAGNOSIS — G43711 Chronic migraine without aura, intractable, with status migrainosus: Secondary | ICD-10-CM | POA: Diagnosis not present

## 2024-04-23 DIAGNOSIS — Z7409 Other reduced mobility: Secondary | ICD-10-CM | POA: Diagnosis not present

## 2024-04-23 DIAGNOSIS — R32 Unspecified urinary incontinence: Secondary | ICD-10-CM | POA: Diagnosis not present

## 2024-04-26 ENCOUNTER — Other Ambulatory Visit: Payer: Self-pay | Admitting: Family Medicine

## 2024-04-26 DIAGNOSIS — E119 Type 2 diabetes mellitus without complications: Secondary | ICD-10-CM

## 2024-04-26 DIAGNOSIS — R739 Hyperglycemia, unspecified: Secondary | ICD-10-CM

## 2024-04-27 ENCOUNTER — Telehealth: Payer: Self-pay | Admitting: Pharmacy Technician

## 2024-04-27 NOTE — Telephone Encounter (Signed)
 Auth Submission: APPROVED Site of care: Site of care: [facility] Payer: [employer] Medication & CPT/J Code(s) submitted: Vyepti  (Eptinezumab ) J3032 Diagnosis Code:  Route of submission (phone, fax, portal): CMM Phone # Fax # Auth type: Pharmacy Benefit - CLEAR [employer]AGGING Units/visits requested: 300MG  Q90 DAYS Reference number: PA Case ID #: [id number] Key: [id number] Approval from: 04/27/24 to 04/27/25

## 2024-04-27 NOTE — Telephone Encounter (Signed)
 Requested Prescriptions  Pending Prescriptions Disp Refills   OZEMPIC , 0.25 OR 0.5 MG/DOSE, 2 MG/3ML SOPN [Pharmacy Med Name: OZEMPIC  2MG /3ML SOLN PEN-INJ] 3 mL 1    Sig: INJECT 0.5 MG INTO THE SKIN ONCE A WEEK.     Endocrinology:  Diabetes - GLP-1 Receptor Agonists - semaglutide  Failed - 04/27/2024  3:21 PM      Failed - HBA1C in normal range and within 180 days    Hgb A1c MFr Bld  Date Value Ref Range Status  12/19/2023 9.1 (H) <5.7 % of total Hgb Final    Comment:    For someone without known diabetes, a hemoglobin A1c value of 6.5% or greater indicates that they may have  diabetes and this should be confirmed with a follow-up  test. . For someone with known diabetes, a value <7% indicates  that their diabetes is well controlled and a value  greater than or equal to 7% indicates suboptimal  control. A1c targets should be individualized based on  duration of diabetes, age, comorbid conditions, and  other considerations. . Currently, no consensus exists regarding use of hemoglobin A1c for diagnosis of diabetes for children. .          Passed - Cr in normal range and within 360 days    Creat  Date Value Ref Range Status  12/19/2023 0.67 0.50 - 1.03 mg/dL Final   Creatinine, Urine  Date Value Ref Range Status  02/13/2023 44 20 - 275 mg/dL Final         Passed - Valid encounter within last 6 months    Recent Outpatient Visits           4 months ago Type 2 diabetes mellitus without complication, without long-term current use of insulin [facility])    [facility] Leavy Mole, PA-C   4 months ago Cellulitis of left lower extremity   [facility] Leavy Mole, PA-C   4 months ago Cellulitis of left lower extremity   [facility] Leavy Mole, PA-C   5 months ago Hyperglycemia   [facility] Leavy Mole, PA-C

## 2024-04-28 ENCOUNTER — Ambulatory Visit: Admitting: Dermatology

## 2024-04-28 ENCOUNTER — Other Ambulatory Visit: Payer: Self-pay

## 2024-04-28 ENCOUNTER — Encounter: Payer: Self-pay | Admitting: Dermatology

## 2024-04-28 VITALS — BP 120/83 | HR 106 | Temp 98.6°F

## 2024-04-28 DIAGNOSIS — D239 Other benign neoplasm of skin, unspecified: Secondary | ICD-10-CM

## 2024-04-28 DIAGNOSIS — D235 Other benign neoplasm of skin of trunk: Secondary | ICD-10-CM

## 2024-04-28 DIAGNOSIS — D225 Melanocytic nevi of trunk: Secondary | ICD-10-CM | POA: Diagnosis not present

## 2024-04-28 NOTE — Progress Notes (Signed)
   Follow-Up Visit   Subjective  Dana Bradley is a 53 y.o. 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  Objective  Well appearing patient in no apparent distress; mood and affect are within normal limits.  A focused examination was performed of the following areas: Mid back Relevant physical exam findings are noted in the Assessment and Plan.   Mid Back Healing biopsy   Assessment & Plan   DYSPLASTIC NEVUS Mid Back Skin excision - Mid Back  Excision method:  elliptical Lesion length (cm):  0.9 Lesion width (cm):  0.8 Margin per side (cm):  0.5 Total excision diameter (cm):  1.9 Informed consent: discussed and consent obtained   Timeout: patient name, date of birth, surgical site, and procedure verified   Procedure prep:  Patient was prepped and draped in usual sterile fashion Prep type:  Chlorhexidine Anesthesia: the lesion was anesthetized in a standard fashion   Anesthetic:  1% lidocaine  w/ epinephrine  1-100,000 buffered w/ 8.4% NaHCO3 Instrument used: #15 blade   Hemostasis achieved with: suture, pressure and electrodesiccation   Hemostasis achieved with comment:  3.0 PDS with dermabond and steri strips Outcome: patient tolerated procedure well with no complications   Post-procedure details: sterile dressing applied and wound care instructions given   Additional details:  Final length 3.3  Skin repair - Mid Back Complexity:  Complex Final length (cm):  3.3 Informed consent: discussed and consent obtained   Timeout: patient name, date of birth, surgical site, and procedure verified   Procedure prep:  Patient was prepped and draped in usual sterile fashion Prep  type:  Chlorhexidine Anesthesia: the lesion was anesthetized in a standard fashion   Reason for type of repair: reduce tension to allow closure, preserve normal anatomy and preserve normal anatomical and functional relationships   Undermining: area extensively undermined   Subcutaneous layers (deep stitches):  Suture size:  3-0 Suture type: PDS (polydioxanone)   Stitches:  Buried vertical mattress Fine/surface layer approximation (top stitches):  Suture type: cyanoacrylate tissue glue   Hemostasis achieved with: suture, pressure and electrodesiccation Outcome: patient tolerated procedure well with no complications   Post-procedure details: sterile dressing applied and wound care instructions given   Dressing type: pressure dressing and petrolatum    Specimen 1 - Surgical pathology Differential Diagnosis: DN  [phone] Check Margins: No  Lidocaine  1% with epinephrine  diluted to 0.25% with normal saline due to patient's sensitivity and predisposition for vasovagal syncope.  Return if symptoms worsen or fail to improve.  I, Darice Smock, CMA, am acting as scribe for RUFUS CHRISTELLA HOLY, MD.   Documentation: I have reviewed the above documentation for accuracy and completeness, and I agree with the above.  RUFUS CHRISTELLA HOLY, MD

## 2024-04-28 NOTE — Patient Instructions (Signed)

## 2024-04-30 LAB — SURGICAL PATHOLOGY

## 2024-05-02 DIAGNOSIS — G4733 Obstructive sleep apnea (adult) (pediatric): Secondary | ICD-10-CM | POA: Diagnosis not present

## 2024-05-03 ENCOUNTER — Ambulatory Visit: Payer: Self-pay | Admitting: Dermatology

## 2024-05-06 ENCOUNTER — Other Ambulatory Visit: Payer: Self-pay

## 2024-05-10 ENCOUNTER — Other Ambulatory Visit (HOSPITAL_COMMUNITY): Payer: Self-pay

## 2024-05-18 ENCOUNTER — Encounter: Attending: Physical Medicine and Rehabilitation | Admitting: Physical Medicine and Rehabilitation

## 2024-05-18 DIAGNOSIS — G43711 Chronic migraine without aura, intractable, with status migrainosus: Secondary | ICD-10-CM | POA: Insufficient documentation

## 2024-05-18 DIAGNOSIS — R32 Unspecified urinary incontinence: Secondary | ICD-10-CM | POA: Insufficient documentation

## 2024-05-18 DIAGNOSIS — Z7409 Other reduced mobility: Secondary | ICD-10-CM | POA: Insufficient documentation

## 2024-05-18 DIAGNOSIS — M797 Fibromyalgia: Secondary | ICD-10-CM | POA: Insufficient documentation

## 2024-05-20 ENCOUNTER — Encounter: Admitting: Dermatology

## 2024-05-24 ENCOUNTER — Ambulatory Visit (INDEPENDENT_AMBULATORY_CARE_PROVIDER_SITE_OTHER): Admitting: Clinical

## 2024-05-24 DIAGNOSIS — F331 Major depressive disorder, recurrent, moderate: Secondary | ICD-10-CM | POA: Diagnosis not present

## 2024-05-24 DIAGNOSIS — F419 Anxiety disorder, unspecified: Secondary | ICD-10-CM | POA: Diagnosis not present
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
## 2024-05-24 NOTE — Progress Notes (Signed)
 SABRA

## 2024-05-26 ENCOUNTER — Other Ambulatory Visit: Payer: Self-pay

## 2024-05-26 MED ORDER — LUBIPROSTONE 24 MCG PO CAPS: 24.0000 ug | ORAL_CAPSULE | Freq: Two times a day (BID) | ORAL | 3 refills | Status: DC

## 2024-05-27 DIAGNOSIS — M797 Fibromyalgia: Secondary | ICD-10-CM | POA: Diagnosis not present

## 2024-05-27 DIAGNOSIS — Z793 Long term (current) use of hormonal contraceptives: Secondary | ICD-10-CM | POA: Diagnosis not present

## 2024-05-27 DIAGNOSIS — Z79899 Other long term (current) drug therapy: Secondary | ICD-10-CM | POA: Diagnosis not present

## 2024-05-27 DIAGNOSIS — Z7984 Long term (current) use of oral hypoglycemic drugs: Secondary | ICD-10-CM | POA: Diagnosis not present

## 2024-05-27 DIAGNOSIS — Z7985 Long-term (current) use of injectable non-insulin antidiabetic drugs: Secondary | ICD-10-CM | POA: Diagnosis not present

## 2024-05-27 DIAGNOSIS — M5416 Radiculopathy, lumbar region: Secondary | ICD-10-CM | POA: Diagnosis not present

## 2024-06-03 ENCOUNTER — Telehealth: Payer: Self-pay

## 2024-06-03 NOTE — Telephone Encounter (Signed)
 Dana Bradley (Key: [id number]) Need Help? Call us  at [phone] Status New Drug Qulipta  30MG  tablets ePA Form [facility] [employer] [name]  Medicaid Electronic PA Form [phone] NCPDP)

## 2024-06-08 ENCOUNTER — Other Ambulatory Visit (HOSPITAL_COMMUNITY): Payer: Self-pay

## 2024-06-08 ENCOUNTER — Other Ambulatory Visit: Payer: Self-pay | Admitting: Family Medicine

## 2024-06-08 ENCOUNTER — Encounter: Payer: Self-pay | Admitting: Physical Medicine and Rehabilitation

## 2024-06-08 ENCOUNTER — Telehealth: Payer: Self-pay | Admitting: Pharmacy Technician

## 2024-06-08 DIAGNOSIS — R739 Hyperglycemia, unspecified: Secondary | ICD-10-CM

## 2024-06-08 DIAGNOSIS — R000 Tachycardia, unspecified: Secondary | ICD-10-CM

## 2024-06-08 DIAGNOSIS — E119 Type 2 diabetes mellitus without complications: Secondary | ICD-10-CM

## 2024-06-08 NOTE — Telephone Encounter (Signed)
 Prior Authorization form/request asks a question that requires your assistance. Please see the question below and advise accordingly. The PA will not be submitted until the necessary information is received.

## 2024-06-09 ENCOUNTER — Other Ambulatory Visit: Payer: Self-pay | Admitting: Physical Medicine and Rehabilitation

## 2024-06-09 DIAGNOSIS — H8110 Benign paroxysmal vertigo, unspecified ear: Secondary | ICD-10-CM

## 2024-06-09 MED ORDER — SCOPOLAMINE 1 MG/3DAYS TD PT72: 1.0000 | MEDICATED_PATCH | TRANSDERMAL | 12 refills | Status: AC

## 2024-06-09 NOTE — Telephone Encounter (Signed)
 Requested Prescriptions  Pending Prescriptions Disp Refills   metoprolol  succinate (TOPROL -XL) 100 MG 24 hr tablet [Pharmacy Med Name: METOPROLOL  SUCCINATE ER 100MG  ER TABLET ER 24HR] 135 tablet 0    Sig: TAKE ONE AND A HALF (1 & 1/2) TABLETS (150 MG TOTAL) BY MOUTH DAILY. TAKE WITH OR IMMEDIATELY FOLLOWING A MEAL.     Cardiovascular:  Beta Blockers Passed - 06/09/2024  2:02 PM      Passed - Last BP in normal range    BP Readings from Last 1 Encounters:  04/28/24 120/83         Passed - Last Heart Rate in normal range    Pulse Readings from Last 1 Encounters:  04/28/24 (!) 106         Passed - Valid encounter within last 6 months    Recent Outpatient Visits           5 months ago Type 2 diabetes mellitus without complication, without long-term current use of insulin [facility])   [place], Michelene, PA-C   6 months ago Cellulitis of left lower extremity   [facility] Michelene, PA-C   6 months ago Cellulitis of left lower extremity   [facility] Le[facility] Michelene, PA-C   7 months ago Hyperglycemia   [facility] Leavy Michelene, PA-C

## 2024-06-09 NOTE — Telephone Encounter (Signed)
 Requested medication (s) are due for refill today: no  Requested medication (s) are on the active medication list: yes  Last refill:  12/09/23  Future visit scheduled: {Yes  Notes to clinic:  Pharmacy comment: please send a new script for [facility] 3 PLUS sensors, Freestyle Libre 3 (NOT PLUS) are being discontinued at the end of September. please send a new script for [employer] 3 PLUS sensors, Freestyle Libre 3 (NOT PLUS) are being discontinued at the end of September.      Requested Prescriptions  Pending Prescriptions Disp Refills   Continuous Glucose Sensor (FREESTYLE LIBRE 3 PLUS SENSOR) MISC [Pharmacy Med Name: FREESTYLE LIBRE 3 PLUS SENSOR] 2 each      There is no refill protocol information for this order

## 2024-06-11 NOTE — Telephone Encounter (Signed)
 Ok thanks, will check back then.

## 2024-06-15 ENCOUNTER — Encounter: Payer: Self-pay | Admitting: Family Medicine

## 2024-06-15 ENCOUNTER — Ambulatory Visit: Admitting: Family Medicine

## 2024-06-15 VITALS — BP 128/84 | HR 94 | Resp 16 | Ht 63.0 in | Wt 221.0 lb

## 2024-06-15 DIAGNOSIS — F4320 Adjustment disorder, unspecified: Secondary | ICD-10-CM

## 2024-06-15 DIAGNOSIS — I471 Supraventricular tachycardia, unspecified: Secondary | ICD-10-CM

## 2024-06-15 DIAGNOSIS — Z5181 Encounter for therapeutic drug level monitoring: Secondary | ICD-10-CM

## 2024-06-15 DIAGNOSIS — K130 Diseases of lips: Secondary | ICD-10-CM

## 2024-06-15 DIAGNOSIS — K219 Gastro-esophageal reflux disease without esophagitis: Secondary | ICD-10-CM

## 2024-06-15 DIAGNOSIS — R1013 Epigastric pain: Secondary | ICD-10-CM

## 2024-06-15 DIAGNOSIS — I10 Essential (primary) hypertension: Secondary | ICD-10-CM | POA: Diagnosis not present

## 2024-06-15 DIAGNOSIS — F4323 Adjustment disorder with mixed anxiety and depressed mood: Secondary | ICD-10-CM

## 2024-06-15 DIAGNOSIS — R300 Dysuria: Secondary | ICD-10-CM | POA: Diagnosis not present

## 2024-06-15 DIAGNOSIS — E119 Type 2 diabetes mellitus without complications: Secondary | ICD-10-CM

## 2024-06-15 DIAGNOSIS — K2930 Chronic superficial gastritis without bleeding: Secondary | ICD-10-CM

## 2024-06-15 DIAGNOSIS — R739 Hyperglycemia, unspecified: Secondary | ICD-10-CM | POA: Diagnosis not present

## 2024-06-15 LAB — POCT URINALYSIS DIPSTICK
Appearance: NORMAL
Bilirubin, UA: NEGATIVE
Blood, UA: NEGATIVE
Glucose, UA: NEGATIVE
Ketones, UA: NEGATIVE
Leukocytes, UA: NEGATIVE
Nitrite, UA: NEGATIVE
Protein, UA: NEGATIVE
Spec Grav, UA: 1.01 (ref 1.010–1.025)
Urobilinogen, UA: 0.2 U/dL
pH, UA: 6 (ref 5.0–8.0)

## 2024-06-15 MED ORDER — FREESTYLE LIBRE 3 PLUS SENSOR MISC
11 refills | Status: AC
Start: 2024-06-15 — End: ?

## 2024-06-15 MED ORDER — METOPROLOL SUCCINATE ER 100 MG PO TB24
ORAL_TABLET | ORAL | 3 refills | Status: AC
Start: 2024-06-15 — End: ?

## 2024-06-15 MED ORDER — OZEMPIC (0.25 OR 0.5 MG/DOSE) 2 MG/3ML ~~LOC~~ SOPN: 0.5000 mg | PEN_INJECTOR | SUBCUTANEOUS | 1 refills | Status: AC

## 2024-06-15 MED ORDER — LANCETS MISC. MISC: 3 refills | Status: AC

## 2024-06-15 MED ORDER — LORAZEPAM 0.5 MG PO TABS: 0.5000 mg | ORAL_TABLET | Freq: Two times a day (BID) | ORAL | 0 refills | Status: AC | PRN

## 2024-06-15 MED ORDER — DEXLANSOPRAZOLE 60 MG PO CPDR: 60.0000 mg | DELAYED_RELEASE_CAPSULE | Freq: Every day | ORAL | 3 refills | Status: AC

## 2024-06-15 MED ORDER — BLOOD GLUCOSE TEST VI STRP: ORAL_STRIP | 3 refills | Status: AC

## 2024-06-15 NOTE — Assessment & Plan Note (Signed)
 See above.

## 2024-06-15 NOTE — Assessment & Plan Note (Signed)
 Pulse Readings from Last 3 Encounters:  06/15/24 94  04/28/24 (!) 106  04/16/24 (!) 109

## 2024-06-15 NOTE — Assessment & Plan Note (Addendum)
 Lab Results  Component Value Date   HGBA1C 9.1 (H) 12/19/2023   Uncontrolled, on metformin  1000 BID and started ozempic , on 0.50 mg dose, but the last 1-2 weeks her GI sx bloating and constipation + gerd worsened so she skipped a dose and worked with GI to get sx to improve, she will try to restart med at 0.25 mg dose  Needs labs today to recheck A1c Libre 3 plus sensor change and orders done again today

## 2024-06-15 NOTE — Assessment & Plan Note (Addendum)
 BP Readings from Last 3 Encounters:  06/15/24 128/84  04/28/24 120/83  04/16/24 107/73  BP near goal today on current meds, prior addition of ACEI/ARB causes severe SE, for now she will continue with same meds and we will monitor BP and renal function

## 2024-06-15 NOTE — Assessment & Plan Note (Signed)
 On dexilant  and managed by GI

## 2024-06-15 NOTE — Assessment & Plan Note (Signed)
 Death of mother, worsening health/cancer dx of daughter, she is on many meds - most managed by PM&R going to therapist     06/15/2024   11:07 AM 05/24/2024   10:52 AM 04/12/2024   10:53 AM  Depression screen PHQ 2/9  Decreased Interest 3  1  Down, Depressed, Hopeless 3  1  PHQ - 2 Score 6  2  Altered sleeping 3    Tired, decreased energy 3    Change in appetite 2    Feeling bad or failure about yourself  3    Trouble concentrating 0    Moving slowly or fidgety/restless 0    Suicidal thoughts 0    PHQ-9 Score 17    Difficult doing work/chores        Information is confidential and restricted. Go to Review Flowsheets to unlock data.      05/12/2023    2:54 PM 02/13/2023    9:45 AM 01/07/2023    3:28 PM 12/09/2022   11:06 AM  GAD 7 : Generalized Anxiety Score  Nervous, Anxious, on Edge 3 3 2 3   Control/stop worrying 3 3 3 3   Worry too much - different things 3 3 2 3   Trouble relaxing 2 3 2 3   Restless 2 1 2 1   Easily annoyed or irritable 3 2 3 3   Afraid - awful might happen 2 2 2 3   Total GAD 7 Score 18 17 16 19   Anxiety Difficulty Extremely difficult Extremely difficult Very difficult Extremely difficult    Phq 9 moderate to severely positive GAD7 severe anxiety sx positive

## 2024-06-15 NOTE — Assessment & Plan Note (Signed)
 See above - worsening sx

## 2024-06-15 NOTE — Progress Notes (Signed)
 Name: Dana Bradley   MRN: [id number]    DOB: 1970-12-26   Date:06/15/2024       Progress Note  Chief Complaint  Patient presents with   Diabetes   Hypertension   Rash   Stress     Subjective:   Dana Bradley is a 53 y.o. female, presents to clinic for routine follow up on chronic conditions  Needs freestyle libre 3+ sensors  Results for orders placed or performed in visit on 06/15/24  POCT Urinalysis Dipstick   Collection Time: 06/15/24 11:34 AM  Result Value Ref Range   Color, UA Yellow    Clarity, UA Clear    Glucose, UA Negative Negative   Bilirubin, UA Negative    Ketones, UA Negative    Spec Grav, UA 1.010 1.010 - 1.025   Blood, UA Negative    pH, UA 6.0 5.0 - 8.0   Protein, UA Negative Negative   Urobilinogen, UA 0.2 0.2 or 1.0 E.U./dL   Nitrite, UA Negative    Leukocytes, UA Negative Negative   Appearance Normal    Odor None    *Note: Due to a large number of results and/or encounters for the requested time period, some results have not been displayed. A complete set of results can be found in Results Review.   Discussed the use of AI scribe software for clinical note transcription with the patient, who gave verbal consent to proceed.  History of Present Illness Dana Bradley is a 53 year old female with diabetes and hypertension who presents for an annual physical exam.  Hyperglycemia and hypoglycemia - Difficulty with blood glucose management; A1c 9.1% in March 2025 - Needs Libre 3 Plus sensors for glucose monitoring - Occasional nocturnal hypoglycemia with sensations of 'vibrating insides' - Treats hypoglycemic episodes with hard candy or juice, lows on meter are around 69 - Current medications: metformin  1000 mg twice daily, Ozempic  0.5 mg weekly (paused due to severe constipation)  Gastrointestinal symptoms - Severe constipation while on Ozempic , with bowel movements only once a month, working with GI, med changes have helped  - Paused Ozempic  due to  constipation for the past 1-2 weeks   Musculoskeletal symptoms - Shoulder stiffness with limited range of motion - wants help believes someone will probably send her to ortho then they'll send her to PT - offered PT referral or PM&R could coordinate  Cracking sides of mouth- raw, better right now - Difficulty opening mouth wider than the thickness of a Pop-Tart without pain - Initiated B vitamin supplementation with some relief  Urinary symptoms - Difficulty urinating and burning sensation with urination - Urine culture performed to rule out infection Dip reviewed unremarkable  Psychological distress and anxiety - Significant stress and anticipatory grief related to daughter's metastatic cancer relapse, likely terminal diagnosis - Prescribed metoprolol  100 mg daily for heart rate management; takes an additional half tablet as needed for panic attacks - asks about Ativan  for anxiety/grief, she arranged to see a therapist  - Managing multiple family and financial stressors, including daughter's cancer treatment and reduced benefits, recent death of her mother - Actively seeking financial assistance to make home safe and manage expenses      Current Outpatient Medications:    amitriptyline  (ELAVIL ) 150 MG tablet, TAKE ONE TABLET (150 MG TOTAL) BY MOUTH AT BEDTIME., Disp: 90 tablet, Rfl: 3   Atogepant  (QULIPTA ) 60 MG TABS, Take 1 tablet (60 mg total) by mouth daily., Disp: 90 tablet, Rfl: 3   atorvastatin  (LIPITOR)  40 MG tablet, TAKE ONE TABLET BY MOUTH EVERY NIGHT AT BEDTIME, Disp: 90 tablet, Rfl: 3   baclofen  (LIORESAL ) 10 MG tablet, Take 0.5-1 tablets (5-10 mg total) by mouth 3 (three) times daily. Each refill must last 30 days., Disp: 270 tablet, Rfl: 1   butalbital -acetaminophen -caffeine  (BAC) 50-325-40 MG tablet, Take 1 tablet by mouth every 4 (four) hours as needed for migraine., Disp: 60 tablet, Rfl: 3   Continuous Glucose Receiver (FREESTYLE LIBRE 3 READER) DEVI, Dispense one  device for uncontrolled diabetes with hyperglycemia, Disp: 1 each, Rfl: 0   Continuous Glucose Sensor (FREESTYLE LIBRE 3 PLUS SENSOR) MISC, Check blood sugar BID, Disp: 2 each, Rfl: 0   DEXILANT  60 MG capsule, Take 1 capsule (60 mg total) by mouth daily., Disp: 90 capsule, Rfl: 1   diclofenac  Sodium (VOLTAREN ) 1 % GEL, Apply topically., Disp: , Rfl:    dicyclomine  (BENTYL ) 10 MG capsule, Take by mouth., Disp: , Rfl:    Eptinezumab -jjmr (VYEPTI ) 100 MG/ML injection, Inject 3 mLs (300 mg total) into the vein every 3 (three) months., Disp: 3 mL, Rfl: 3   HYDROcodone -acetaminophen  (NORCO) 7.5-325 MG tablet, Take 1 tablet by mouth daily as needed for severe pain (pain score 7-10). Must last 30 days, Disp: 30 tablet, Rfl: 0   lubiprostone  (AMITIZA ) 24 MCG capsule, Take 1 capsule (24 mcg total) by mouth 2 (two) times daily with a meal., Disp: 60 capsule, Rfl: 3   meclizine  (ANTIVERT ) 12.5 MG tablet, Take 1 tablet (12.5 mg total) by mouth 3 (three) times daily as needed for dizziness., Disp: 30 tablet, Rfl: 0   medroxyPROGESTERone  (PROVERA ) 5 MG tablet, Take 1 tablet (5 mg total) by mouth daily., Disp: 90 tablet, Rfl: 3   metFORMIN  (GLUCOPHAGE ) 1000 MG tablet, Take 1 tablet (1,000 mg total) by mouth 2 (two) times daily with a meal., Disp: 180 tablet, Rfl: 3   metoprolol  succinate (TOPROL -XL) 100 MG 24 hr tablet, TAKE ONE AND A HALF (1 & 1/2) TABLETS (150 MG TOTAL) BY MOUTH DAILY. TAKE WITH OR IMMEDIATELY FOLLOWING A MEAL., Disp: 135 tablet, Rfl: 0   ondansetron  (ZOFRAN ) 4 MG tablet, TAKE ONE TABLET BY MOUTH EVERY EIGHT HOURS AS NEEDED, Disp: 20 tablet, Rfl: 2   OZEMPIC , 0.25 OR 0.5 MG/DOSE, 2 MG/3ML SOPN, INJECT 0.5 MG INTO THE SKIN ONCE A WEEK., Disp: 3 mL, Rfl: 1   pregabalin  (LYRICA ) 150 MG capsule, Take 1 capsule (150 mg total) by mouth 3 (three) times daily as needed., Disp: 270 capsule, Rfl: 3   scopolamine  (TRANSDERM-SCOP) 1 MG/3DAYS, Place 1 patch (1 mg total) onto the skin every 3 (three) days.,  Disp: 10 patch, Rfl: 12   Suzetrigine  (JOURNAVX ) 50 MG TABS, Take 1 tablet by mouth 2 (two) times daily as needed. (Patient not taking: Reported on 06/15/2024), Disp: 60 tablet, Rfl: 0  Patient Active Problem List   Diagnosis Date Noted   Inclusion cyst of vulva 12/15/2023   Perimenopause 12/15/2023   Irregular menses 12/15/2023   Pharmacologic therapy 03/12/2023   Chronic use of opiate for therapeutic purpose 03/12/2023   Abnormal CT of the abdomen 11/13/2022   Adenomatous polyp of colon 11/13/2022   Herniated nucleus pulposus, L3-4 (Right) 10/29/2022   Lumbar radiculopathy 10/21/2022   Dyspepsia 09/26/2022   Blood in stool 09/26/2022   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HCC) 04/04/2022   Neurogenic bladder 01/28/2022   Urinary and fecal incontinence 01/28/2022   Type 2 diabetes mellitus without complication, without long-term current use of insulin (HCC) 01/02/2022  Excessive daytime sleepiness 12/14/2021   Lateral epicondylitis, left elbow 07/19/2021   Abnormal MRI, cervical spine (02/14/2021) 02/28/2021   Cervicalgia 02/13/2021   Abnormal MRI, lumbar spine (12/27/2021) 11/09/2020   Chronic midline thoracic back pain 11/09/2020   Abnormal MRI, thoracic spine (08/02/2019) 11/09/2020   Thoracic spinal stenosis (T7-8) 11/09/2020   Prolapse of thoracic disc with radiculopathy (T7-8) 11/09/2020   Spasm of muscle of lower back 11/09/2020   Vertigo, benign paroxysmal, unspecified laterality 11/09/2020   Uncomplicated opioid dependence (HCC) 11/08/2020   Hyperalgesia 03/07/2020   Neurogenic urinary incontinence 03/01/2020   Other intervertebral disc degeneration, lumbar region 03/01/2020   Spinal stenosis of lumbosacral region 03/01/2020   Colon cancer screening 02/06/2020   Lumbar radiculitis (Right) 09/21/2019   Chronic lower extremity pain (Bilateral) 09/06/2019   Intractable migraine with aura without status migrainosus 08/10/2019   Other specified dorsopathies, sacral and sacrococcygeal  region 08/03/2019   Latex precautions, history of latex allergy 08/03/2019   History of allergy to radiographic contrast media 08/03/2019   DDD (degenerative disc disease), cervical 07/21/2019   Cervical facet syndrome (Bilateral) (L>R) 07/21/2019   DDD (degenerative disc disease), thoracic 07/21/2019   Osteoarthritis of hip (Left) 07/21/2019   Chronic groin pain (Bilateral) (L>R) 07/21/2019   Chronic hip pain (Bilateral) (L>R) 07/21/2019   Somatic dysfunction of sacroiliac joint (Bilateral) 07/21/2019   Migraine with aura and with status migrainosus, not intractable 04/06/2019   Cervico-occipital neuralgia (Left) 04/06/2019   Weakness of leg (Left) 04/05/2019   Difficulty walking 04/05/2019   Chronic migraine without aura, with intractable migraine, so stated, with status migrainosus 12/27/2018   Malar rash 09/04/2018   Trigger point of neck (Left) 03/19/2018   Occipital headache 12/25/2017   Chronic fatigue syndrome with fibromyalgia 12/12/2017   Trigger point of shoulder region (Left) 11/17/2017   Myofascial pain syndrome (Left) (trapezius muscle) 07/22/2017   Lumbar L1-2 disc protrusion (Right) 04/07/2017   Muscle spasticity 04/01/2017   Osteoarthritis of shoulder (Bilateral) 04/01/2017   Lumbar spondylosis 01/06/2017   Chronic hip pain (Left) 12/24/2016   Chronic sacroiliac joint pain (Left) 12/24/2016   Lumbar facet syndrome (Bilateral) (L>R) 12/24/2016   Lumbar radiculitis (Left) 12/24/2016   Hypertriglyceridemia 11/27/2016   History of vasovagal episode 10/30/2016   Cervicogenic headache 09/09/2016   Medication monitoring encounter 08/29/2016   Controlled substance agreement signed 08/28/2016   Plantar fasciitis of left foot 08/28/2016   Vitamin B12 deficiency 08/28/2016   Hyperlipidemia 08/28/2016   Nephrolithiasis 08/12/2016   Chronic pain syndrome 08/07/2016   Long term current use of opiate analgesic 08/07/2016   Long term prescription opiate use 08/07/2016    Opiate use 08/07/2016   Long term prescription benzodiazepine use 08/07/2016   Neurogenic pain 08/07/2016   Chronic low back pain (1ry area of Pain) (Bilateral) (R>L) (midline) w/ sciatica (Bilateral) 08/07/2016   Chronic upper back pain (2ry area of Pain) (Bilateral) (L>R) 08/07/2016   Chronic abdominal pain (Right lower quadrant) 08/07/2016   Thoracic radiculitis (Bilateral: T10, T11) 08/07/2016   Chronic occipital neuralgia (3ry area of Pain) (Bilateral) (L>R) 08/07/2016   Chronic neck pain 08/07/2016   Chronic cervical radicular pain (Bilateral) (L>R) 08/07/2016   Chronic shoulder blade pain (Bilateral) (L>R) 08/07/2016   Chronic upper extremity pain (Bilateral) (R>L) 08/07/2016   Chronic knee pain (Bilateral) (R>L) 08/07/2016   Chronic ankle pain (Bilateral) 08/07/2016   Cervical spondylosis with myelopathy and radiculopathy 08/07/2016   Panic disorder with agoraphobia 05/29/2016   Depression, unspecified depression type 05/29/2016  Atypical lymphocytosis 05/01/2016   Vitamin D  deficiency 05/01/2016   Chronic lower extremity cramps (Bilateral) (R>L) 04/29/2016   Obesity, Class III, BMI 40-49.9 (morbid obesity) 04/29/2016   GAD (generalized anxiety disorder) 04/29/2016   Fatigue 04/29/2016   Insomnia 07/12/2015   Migraine without aura and with status migrainosus, not intractable 07/12/2015   Chronic superficial gastritis 06/02/2015   Chronic pain of multiple joints 05/15/2015   Bilateral leg edema 05/02/2015   Paroxysmal supraventricular tachycardia (HCC) 04/17/2015   Exertional shortness of breath 04/17/2015   Bright red rectal bleeding 04/06/2015   DDD (degenerative disc disease), lumbosacral 01/24/2014   DDD (degenerative disc disease), lumbar 01/24/2014   Cervico-occipital neuralgia 12/29/2013   Fibromyalgia 12/29/2013   Migraine headache 12/29/2013   Menorrhagia 12/10/2012   Depression, major, recurrent, in remission (HCC) 01/12/2009   Chest pain 01/12/2009    Hypertension, benign essential, goal below 140/90 06/23/2008   History of PSVT (paroxysmal supraventricular tachycardia) 06/17/2008   Obstructive sleep apnea 06/17/2008   GERD 06/13/2008    Past Surgical History:  Procedure Laterality Date   ABLATION     Uterine   BREAST BIOPSY Left    2014 U/S bx fibroadenoma   BREAST BIOPSY Left 05/28/2023   lr us  bx 3:00 heart clip   BREAST BIOPSY Left 05/28/2023   US  LT BREAST BX W LOC DEV 1ST LESION IMG BX SPEC US  GUIDE 05/28/2023 [facility]   CARDIAC CATHETERIZATION     with ablation   COLONOSCOPY N/A 09/26/2022   Procedure: COLONOSCOPY;  Surgeon: Therisa Bi, MD;  Location[facility] ENDOSCOPY;  Service: Gastroenterology;  Laterality: N/A;   COLONOSCOPY WITH PROPOFOL  N/A 05/17/2015   Procedure: COLONOSCOPY WITH PROPOFOL ;  Surgeon: Lamar ONEIDA Holmes, MD;  Location: [facility] ENDOSCOPY;  Service: Endoscopy;  Laterality: N/A;   COLONOSCOPY WITH PROPOFOL  N/A 03/20/2020   Procedure: COLONOSCOPY WITH PROPOFOL ;  Surgeon: Therisa Bi, MD;  Location: [facility] ENDOSCOPY;  Service: Gastroenterology;  Laterality: N/A;   COLONOSCOPY WITH PROPOFOL  N/A 09/25/2022   Procedure: COLONOSCOPY WITH PROPOFOL ;  Surgeon: Therisa Bi, MD;  Location: [facility] ENDOSCOPY;  Service: Gastroenterology;  Laterality: N/A;   COLONOSCOPY WITH PROPOFOL  N/A 10/02/2022   Procedure: COLONOSCOPY WITH PROPOFOL ;  Surgeon: Therisa Bi, MD;  Location: [facility] ENDOSCOPY;  Service: Gastroenterology;  Laterality: N/A;   COLONOSCOPY WITH PROPOFOL  N/A 11/13/2022   Procedure: COLONOSCOPY WITH PROPOFOL ;  Surgeon: Therisa Bi, MD;  Location: [facility] ENDOSCOPY;  Service: Gastroenterology;  Laterality: N/A;   ESOPHAGOGASTRODUODENOSCOPY N/A 05/17/2015   Procedure: ESOPHAGOGASTRODUODENOSCOPY (EGD);  Surgeon: Lamar ONEIDA Holmes, MD;  Location: [facility] ENDOSCOPY;  Service: Endoscopy;  Laterality: N/A;   ESOPHAGOGASTRODUODENOSCOPY N/A 09/25/2022   Procedure: ESOPHAGOGASTRODUODENOSCOPY (EGD);  Surgeon: Therisa Bi, MD;   Location: [facility] ENDOSCOPY;  Service: Gastroenterology;  Laterality: N/A;   ESOPHAGOGASTRODUODENOSCOPY N/A 09/26/2022   Procedure: ESOPHAGOGASTRODUODENOSCOPY (EGD);  Surgeon: Therisa Bi, MD;  Location: [facility] ENDOSCOPY;  Service: Gastroenterology;  Laterality: N/A;   KNEE ARTHROSCOPY     spg     6/18   Tibial Tubercle Bypass Right 1998   TUBAL LIGATION  10/01/1999    Family History  Problem Relation Age of Onset   Depression Mother    Hypertension Mother    Cancer Mother        Skin   Hyperlipidemia Mother    Anxiety disorder Mother    Migraines Mother    Alcohol abuse Father    Depression Father    Stroke Father    Heart disease Father    Hypertension Father    Anxiety disorder  Father    Depression Sister    Hyperlipidemia Sister    Diabetes Sister    Hypertension Sister    Polycystic ovary syndrome Sister    Bipolar disorder Sister    Anxiety disorder Sister    Migraines Sister    Depression Sister    Hypertension Sister    Anxiety disorder Sister    Migraines Sister    Breast cancer Maternal Grandmother 23   Cancer Maternal Grandmother 82       Breast   Thyroid  disease Maternal Grandmother    Arthritis Maternal Grandmother    Hyperlipidemia Maternal Grandmother    Aneurysm Maternal Grandfather    Hypertension Maternal Grandfather    Heart disease Maternal Grandfather    Alzheimer's disease Paternal Grandmother    Heart attack Paternal Grandfather    Hypertension Paternal Grandfather    COPD Paternal Grandfather    Heart disease Paternal Grandfather    Migraines Daughter    Other Daughter        leomyoa scaroma   Migraines Daughter    Migraines Son    Alzheimer's disease Other    Bladder Cancer Neg Hx    Kidney cancer Neg Hx     Social History   Tobacco Use   Smoking status: Former    Current packs/day: 0.00    Average packs/day: 2.0 packs/day for 3.0 years (6.0 ttl pk-yrs)    Types: Cigarettes    Start date: 12/10/1989    Quit date: 12/10/1992     Years since quitting: 31.5   Smokeless tobacco: Never   Tobacco comments:    quit 25 years ago  Vaping Use   Vaping status: Never Used  Substance Use Topics   Alcohol use: No    Comment: socially   Drug use: No     Allergies  Allergen Reactions   Aspirin Swelling   Cephalexin Rash   Cymbalta [Duloxetine Hcl] Other (See Comments)    Suicidal ideations and has homicidal thoughts per patient   Depakote [Divalproex Sodium] Shortness Of Breath    W/ n/v   Gadolinium Derivatives     Pt was unable to breath Other reaction(s): Other (See Comments) Pt was unable to breath   Haloperidol Shortness Of Breath    W/ n/v   Meperidine Nausea And Vomiting    Patient projectile vomits and usually result in ER Other reaction(s): Vomiting   Metoclopramide Shortness Of Breath    Can't breath, wheezes  Other reaction(s): Other (See Comments)  Can't breath, wheezes Other reaction(s): Other (See Comments)   Morphine     Other reaction(s): Vomiting   Penicillins Rash    Other reaction(s): Unknown   Prochlorperazine Other (See Comments)    Panic attack Other reaction(s): Other (See Comments)   Tramadol  Hcl Palpitations    Severely and adversely affects her SVT giving her tachycardias of 150-160 bpm.   Trazodone Shortness Of Breath    Other reaction(s): Other (See Comments)   Meloxicam Other (See Comments)    mouth sores, tingling, blisters in mouth Other reaction(s): Other (See Comments) mout sores, tingling mouth sores, tingling, blisters in mouth   Neomycin-Bacitracin Zn-Polymyx Rash   Tomato Hives    Tongue will blister   Egg-Derived Products Other (See Comments)    inflammation   Milk-Related Compounds Other (See Comments)    inflammation   Other     Other reaction(s): Other (See Comments)   Shellfish Allergy     Other reaction(s): Unknown   Shellfish-Derived Products  Other (See Comments)    Other reaction(s): Unknown Other reaction(s): Unknown    Bacitra-Neomycin-Polymyxin-Hc Rash   Bacitracin-Neomycin-Polymyxin Rash   Cephalosporins Rash    rash   Ibuprofen Other (See Comments) and Rash    Blisters in mouth. Blisters in mouth.   Latex Itching    Other reaction(s): Unknown   Nsaids Other (See Comments)    Blisters in mouth; can take 1 ibuprofen 2x a month Other reaction(s): Other (See Comments), Unknown Blisters in mouth; can take 1 ibuprofen 2x a month   Sulfa Antibiotics Rash    rash Other reaction(s): Unknown rash  Other reaction(s): Unknown rash rash Other reaction(s): Unknown rash   Sulfonamide Derivatives Rash    Health Maintenance  Topic Date Due   Pneumococcal Vaccine: 50+ Years (1 of 2 - PCV) Never done   Diabetic kidney evaluation - Urine ACR  02/13/2024   Influenza Vaccine  04/30/2024   COVID-19 Vaccine (4 - 2025-26 season) 07/01/2024 (Originally 05/31/2024)   Zoster Vaccines- Shingrix (1 of 2) 09/14/2024 (Originally 06/15/1990)   Hepatitis B Vaccines 19-59 Average Risk (1 of 3 - 19+ 3-dose series) 06/15/2025 (Originally 06/15/1990)   HEMOGLOBIN A1C  06/20/2024   Diabetic kidney evaluation - eGFR measurement  12/18/2024   OPHTHALMOLOGY EXAM  12/18/2024   FOOT EXAM  12/21/2024   Mammogram  05/20/2025   Cervical Cancer Screening (HPV/Pap Cotest)  11/13/2025   Colonoscopy  11/13/2032   Hepatitis C Screening  Completed   HIV Screening  Completed   HPV VACCINES  Aged Out   Meningococcal B Vaccine  Aged Out   DTaP/Tdap/Td  Discontinued    Chart Review Today: I personally reviewed active problem list, medication list, allergies, family history, social history, health maintenance, notes from last encounter, lab results, imaging with the patient/caregiver today.   Review of Systems  Constitutional: Negative.   HENT: Negative.    Eyes: Negative.   Respiratory: Negative.    Cardiovascular: Negative.   Gastrointestinal: Negative.   Endocrine: Negative.   Genitourinary: Negative.   Musculoskeletal:  Negative.   Skin: Negative.   Allergic/Immunologic: Negative.   Neurological: Negative.   Hematological: Negative.   Psychiatric/Behavioral: Negative.    All other systems reviewed and are negative.    Objective:   Vitals:   06/15/24 1122  BP: 128/84  Pulse: 94  Resp: 16  SpO2: 100%  Weight: 221 lb (100.2 kg)  Height: 5' 3 (1.6 m)    Body mass index is 39.15 kg/m.  Physical Exam Vitals and nursing note reviewed.  Constitutional:      General: She is not in acute distress.    Appearance: Normal appearance. She is well-developed. She is obese. She is not ill-appearing, toxic-appearing or diaphoretic.  HENT:     Head: Normocephalic and atraumatic.     Right Ear: External ear normal.     Left Ear: External ear normal.     Nose: Nose normal.  Eyes:     General: No scleral icterus.       Right eye: No discharge.        Left eye: No discharge.     Conjunctiva/sclera: Conjunctivae normal.  Neck:     Trachea: No tracheal deviation.  Cardiovascular:     Rate and Rhythm: Normal rate and regular rhythm.     Pulses: Normal pulses.     Heart sounds: Normal heart sounds.  Pulmonary:     Effort: Pulmonary effort is normal. No respiratory distress.     Breath sounds:  No stridor.  Musculoskeletal:     Left shoulder: Decreased range of motion.  Skin:    General: Skin is warm and dry.     Findings: No rash.  Neurological:     Mental Status: She is alert.     Motor: No abnormal muscle tone.     Coordination: Coordination normal.     Gait: Gait normal.  Psychiatric:        Attention and Perception: Attention normal.        Mood and Affect: Mood is anxious. Affect is tearful.        Speech: Speech normal.        Behavior: Behavior normal. Behavior is cooperative.        Thought Content: Thought content normal.      Functional Status Survey: Is the patient deaf or have difficulty hearing?: No Does the patient have difficulty seeing, even when wearing glasses/contacts?:  No Does the patient have difficulty concentrating, remembering, or making decisions?: No Does the patient have difficulty walking or climbing stairs?: Yes Does the patient have difficulty dressing or bathing?: Yes Does the patient have difficulty doing errands alone such as visiting a doctor's office or shopping?: No  Results for orders placed or performed in visit on 06/15/24  POCT Urinalysis Dipstick   Collection Time: 06/15/24 11:34 AM  Result Value Ref Range   Color, UA Yellow    Clarity, UA Clear    Glucose, UA Negative Negative   Bilirubin, UA Negative    Ketones, UA Negative    Spec Grav, UA 1.010 1.010 - 1.025   Blood, UA Negative    pH, UA 6.0 5.0 - 8.0   Protein, UA Negative Negative   Urobilinogen, UA 0.2 0.2 or 1.0 E.U./dL   Nitrite, UA Negative    Leukocytes, UA Negative Negative   Appearance Normal    Odor None    *Note: Due to a large number of results and/or encounters for the requested time period, some results have not been displayed. A complete set of results can be found in Results Review.      Assessment & Plan:   Assessment & Plan  Problem List Items Addressed This Visit       Cardiovascular and Mediastinum   Hypertension, benign essential, goal below 140/90   BP Readings from Last 3 Encounters:  06/15/24 128/84  04/28/24 120/83  04/16/24 107/73  BP near goal today on current meds, prior addition of ACEI/ARB causes severe SE, for now she will continue with same meds and we will monitor BP and renal function       Relevant Medications   metoprolol  succinate (TOPROL -XL) 100 MG 24 hr tablet   Other Relevant Orders   Comprehensive Metabolic Panel (CMET)   Paroxysmal supraventricular tachycardia (HCC)   Pulse Readings from Last 3 Encounters:  06/15/24 94  04/28/24 (!) 106  04/16/24 (!) 109  Stable sx on current med       Relevant Medications   metoprolol  succinate (TOPROL -XL) 100 MG 24 hr tablet     Digestive   GERD   On dexilant  and  managed by GI      Relevant Medications   dexlansoprazole  (DEXILANT ) 60 MG capsule   Other Relevant Orders   Comprehensive Metabolic Panel (CMET)   CBC with Differential/Platelet   Chronic superficial gastritis   See above- per GI      Relevant Medications   dexlansoprazole  (DEXILANT ) 60 MG capsule     Endocrine  Type 2 diabetes mellitus without complication, without long-term current use of insulin (HCC) - Primary   Lab Results  Component Value Date   HGBA1C 9.1 (H) 12/19/2023   Uncontrolled, on metformin  1000 BID and started ozempic , on 0.50 mg dose, but the last 1-2 weeks her GI sx bloating and constipation + gerd worsened so she skipped a dose and worked with GI to get sx to improve, she will try to restart med at 0.25 mg dose  Needs labs today to recheck A1c Libre 3 plus sensor change and orders done again today Type 2 diabetes mellitus with fluctuating blood glucose levels. Recent A1c was 9.1 in March, indicating poor control. Reports hypoglycemic episodes at night, with symptoms of jitteriness and vibrating sensation. Currently not using continuous glucose monitor due to lack of sensors. - Order Libre 3 Plus sensors for continuous glucose monitoring - Adjust low glucose alarm to 65 mg/dL - Provide lancets for finger stick glucose monitoring - Continue metformin  1000 mg twice daily - Discuss restarting Ozempic  at a lower dose to manage blood glucose and reduce gastrointestinal side effects      Relevant Medications   Continuous Glucose Sensor (FREESTYLE LIBRE 3 PLUS SENSOR) MISC   Semaglutide ,0.25 or 0.5MG /DOS, (OZEMPIC , 0.25 OR 0.5 MG/DOSE,) 2 MG/3ML SOPN Strips and lancets also ordered to her pharmacy   Other Relevant Orders   HgB A1c   Comprehensive Metabolic Panel (CMET)   Urine Microalbumin w/creat. ratio   Lipid Profile     Other   Dyspepsia   See above - per GI      Relevant Medications   dexlansoprazole  (DEXILANT ) 60 MG capsule   Grief reaction   Death  of mother, worsening health/cancer dx of daughter, she is on many meds - most managed by PM&R going to therapist     06/15/2024   11:07 AM 05/24/2024   10:52 AM 04/12/2024   10:53 AM  Depression screen PHQ 2/9  Decreased Interest 3  1  Down, Depressed, Hopeless 3  1  PHQ - 2 Score 6  2  Altered sleeping 3    Tired, decreased energy 3    Change in appetite 2    Feeling bad or failure about yourself  3    Trouble concentrating 0    Moving slowly or fidgety/restless 0    Suicidal thoughts 0    PHQ-9 Score 17    Difficult doing work/chores        Information is confidential and restricted. Go to Review Flowsheets to unlock data.      05/12/2023    2:54 PM 02/13/2023    9:45 AM 01/07/2023    3:28 PM 12/09/2022   11:06 AM  GAD 7 : Generalized Anxiety Score  Nervous, Anxious, on Edge 3 3 2 3   Control/stop worrying 3 3 3 3   Worry too much - different things 3 3 2 3   Trouble relaxing 2 3 2 3   Restless 2 1 2 1   Easily annoyed or irritable 3 2 3 3   Afraid - awful might happen 2 2 2 3   Total GAD 7 Score 18 17 16 19   Anxiety Difficulty Extremely difficult Extremely difficult Very difficult Extremely difficult   We have been referring to psychiatry for many months/years, she is going to see a therapists, there is some family therapy and [facility] at [facility], with her other complex med issues and medications psychiatry would be helpful Phq 9 moderate to severely positive GAD7 severe anxiety sx positive Supportive  listening and empathizing today      Relevant Medications   LORazepam  (ATIVAN ) 0.5 MG tablet   Adjustment disorder with mixed anxiety and depressed mood   See above - worsening sx      Relevant Medications   LORazepam  (ATIVAN ) 0.5 MG tablet Ativan  usage, SE, risks all reviewed today and pt verbalized understanding.  Warned no driving or operating heavy machinery with use of this med, do not take with pain meds or other controlled substances or sedating medications - may potentiate  the medication or cause confusion PDMP reviewed today prior to prescribing, no narcotic pain meds for more than 5 months, not currently taking   Other Visit Diagnoses       Cheilitis       some improvement with Bcomplex supplement, she has dry mouth, is working on that, consider azole tx if still sx      Anticipatory grief       worsening sx with relapse of daughter, anticipate pt and family will need more resources and support   Relevant Medications   LORazepam  (ATIVAN ) 0.5 MG tablet     Dysuria       metioned to CMA not to me, we discussed at end of visit, dip unremarkable will follow culture   Relevant Orders   POCT Urinalysis Dipstick (Completed)   Urine Culture     Encounter for medication monitoring       Relevant Orders   HgB A1c   Comprehensive Metabolic Panel (CMET)   Urine Microalbumin w/creat. ratio   Lipid Profile   CBC with Differential/Platelet           Constipation Severe constipation with infrequent bowel movements, previously reported as once a month. Currently improving with increased Amitiza  dosage. - Continue Amitiza  with increased dosage as discussed with GI  Hypertension Hypertension managed with metoprolol . Reports occasional panic attacks, which may require increased metoprolol  dosage. - Continue metoprolol  100 mg daily - Adjust metoprolol  dosage to 150 mg as needed for panic attacks  Anxiety and anticipatory grief Experiencing anxiety and anticipatory grief related to family health issues. Reports increased stress and difficulty sleeping. - Prescribe Ativan  10-20 tablets for acute anxiety episodes - Encourage use of Ativan  before anxiety becomes severe  Gastroesophageal reflux disease (GERD) GERD managed with Dexilant . - Continue Dexilant  for GERD management  Mouth cracking and pain, possible angular cheilitis or nutritional deficiency Mouth cracking and pain, possibly due to dehydration or nutritional deficiency. Reports improvement with B  vitamin supplementation. - Continue B vitamin supplementation - Evaluate for potential nutritional deficiencies  Shoulder stiffness and pain, possible adhesive capsulitis or impingement syndrome- per her preference - she will f/up with PM&R (if she knows what PT she would like to go to I offered to refer her as well) Shoulder stiffness and pain, likely due to adhesive capsulitis or impingement syndrome. Symptoms exacerbated by perimenopausal changes and stress. - Refer to physical therapy for shoulder stiffness and pain management  Dysuria Intermittent dysuria with sharp pain and burning sensation. Recent urine dip negative, awaiting culture results. - Await urine culture results - Treat with antibiotics if culture indicates UTI  Perimenopausal symptoms Perimenopausal symptoms contributing to physical discomfort and stress. - Monitor symptoms and consider management options as needed  Recording duration: 45 minutes     Urinary sx - dip neg, will follow culture  Shoulder pains - PT referral from us  or PM&R  PDMP reviewed, ativan  appropriate for recent death of parent, relapse of metastatic cancer of  her daughter, increased stress and anticipatory grief, she is going to therapy in [place]      Return in about 6 months (around 12/13/2024) for reschedule CPE .   Michelene Cower, PA-C 06/15/24 11:47 AM

## 2024-06-16 ENCOUNTER — Other Ambulatory Visit (HOSPITAL_COMMUNITY): Payer: Self-pay

## 2024-06-16 ENCOUNTER — Ambulatory Visit: Payer: Self-pay | Admitting: Family Medicine

## 2024-06-16 ENCOUNTER — Encounter: Payer: Self-pay | Admitting: Family Medicine

## 2024-06-16 LAB — CBC WITH DIFFERENTIAL/PLATELET
Absolute Lymphocytes: 2508 {cells}/uL (ref 850–3900)
Absolute Monocytes: 638 {cells}/uL (ref 200–950)
Basophils Absolute: 99 {cells}/uL (ref 0–200)
Basophils Relative: 0.9 %
Eosinophils Absolute: 693 {cells}/uL — ABNORMAL HIGH (ref 15–500)
Eosinophils Relative: 6.3 %
HCT: 39.8 % (ref 35.0–45.0)
Hemoglobin: 13 g/dL (ref 11.7–15.5)
MCH: 31.3 pg (ref 27.0–33.0)
MCHC: 32.7 g/dL (ref 32.0–36.0)
MCV: 95.9 fL (ref 80.0–100.0)
MPV: 9.9 fL (ref 7.5–12.5)
Monocytes Relative: 5.8 %
Neutro Abs: 7062 {cells}/uL (ref 1500–7800)
Neutrophils Relative %: 64.2 %
Platelets: 321 Thousand/uL (ref 140–400)
RBC: 4.15 Million/uL (ref 3.80–5.10)
RDW: 14.2 % (ref 11.0–15.0)
Total Lymphocyte: 22.8 %
WBC: 11 Thousand/uL — ABNORMAL HIGH (ref 3.8–10.8)

## 2024-06-16 LAB — MICROALBUMIN / CREATININE URINE RATIO
Creatinine, Urine: 34 mg/dL (ref 20–275)
Microalb Creat Ratio: 12 mg/g{creat} (ref <30)
Microalb, Ur: 0.4 mg/dL

## 2024-06-16 LAB — COMPREHENSIVE METABOLIC PANEL WITH GFR
AG Ratio: 1.8 (calc) (ref 1.0–2.5)
ALT: 34 U/L — ABNORMAL HIGH (ref 6–29)
AST: 24 U/L (ref 10–35)
Albumin: 4.6 g/dL (ref 3.6–5.1)
Alkaline phosphatase (APISO): 103 U/L (ref 37–153)
BUN: 9 mg/dL (ref 7–25)
CO2: 28 mmol/L (ref 20–32)
Calcium: 9.9 mg/dL (ref 8.6–10.4)
Chloride: 101 mmol/L (ref 98–110)
Creat: 0.58 mg/dL (ref 0.50–1.03)
Globulin: 2.6 g/dL (ref 1.9–3.7)
Glucose, Bld: 114 mg/dL — ABNORMAL HIGH (ref 65–99)
Potassium: 4.4 mmol/L (ref 3.5–5.3)
Sodium: 143 mmol/L (ref 135–146)
Total Bilirubin: 0.4 mg/dL (ref 0.2–1.2)
Total Protein: 7.2 g/dL (ref 6.1–8.1)
eGFR: 108 mL/min/1.73m2 (ref >=60)

## 2024-06-16 LAB — LIPID PANEL
Cholesterol: 178 mg/dL (ref <200)
HDL: 40 mg/dL — ABNORMAL LOW (ref >=50)
LDL Cholesterol (Calc): 86 mg/dL
Non-HDL Cholesterol (Calc): 138 mg/dL — ABNORMAL HIGH (ref <130)
Total CHOL/HDL Ratio: 4.5 (calc) (ref <5.0)
Triglycerides: 390 mg/dL — ABNORMAL HIGH (ref <150)

## 2024-06-16 LAB — HEMOGLOBIN A1C
Hgb A1c MFr Bld: 7.2 % — ABNORMAL HIGH (ref <5.7)
Mean Plasma Glucose: 160 mg/dL
eAG (mmol/L): 8.9 mmol/L

## 2024-06-16 LAB — URINE CULTURE
MICRO NUMBER:: 16974778
SPECIMEN QUALITY:: ADEQUATE

## 2024-06-16 NOTE — Telephone Encounter (Signed)
 Pharmacy Patient Advocate Encounter  Received notification from [employer] that Prior Authorization for FreeStyle Libre 3 Sensor  has been APPROVED from 06/16/24 to 12/13/24. Ran test claim, Copay is $0.00. This test claim was processed through [facility]- copay amounts may vary at other pharmacies due to pharmacy/plan contracts, or as the patient moves through the different stages of their insurance plan.   PA #/Case ID/Reference #: [id number]

## 2024-06-16 NOTE — Telephone Encounter (Signed)
 Pharmacy Patient Advocate Encounter   Received notification from CoverMyMeds that prior authorization for FreeStyle Libre 3 Sensor  is required/requested.   Insurance verification completed.   The patient is insured through [employer] .   Per test claim: PA required; PA submitted to above mentioned insurance via Latent Key/confirmation #/EOC [id number] Status is pending

## 2024-06-23 ENCOUNTER — Ambulatory Visit (INDEPENDENT_AMBULATORY_CARE_PROVIDER_SITE_OTHER): Admitting: Clinical

## 2024-06-23 DIAGNOSIS — F331 Major depressive disorder, recurrent, moderate: Secondary | ICD-10-CM

## 2024-06-23 DIAGNOSIS — F419 Anxiety disorder, unspecified: Secondary | ICD-10-CM | POA: Diagnosis not present

## 2024-06-23 NOTE — Progress Notes (Signed)
 Virtual Visit via Video Note  I connected with Dana Bradley on 06/23/24 at 10:00 AM EDT by a video enabled telemedicine application and verified that I am speaking with the correct person using two identifiers.  Location: Patient: home Provider: office   I discussed the limitations of evaluation and management by telemedicine and the availability of in person appointments. The patient expressed understanding and agreed to proceed.  THERAPIST PROGRESS NOTE   Session Time: 10:00 AM-10:45 AM   Participation Level: Active   Behavioral Response: CasualAlertDepressed   Type of Therapy: Individual Therapy   Treatment Goals addressed: Anger Mood Management and Coping   Interventions: CBT, Motivational Interviewing, Solution Focused and Strength-based   Summary: Dana Bradley is a 53 y.o. female who presents with Recurrent Moderate MDD with Anxiety. The OPT therapist worked with the patient for her scheduled OPT treatment.The patient reported calmer last few weeks with no additional reports of falls, injuries, or ER visits. The patient spoke about  recovering from stomach bug over the beginning of August.. The patient did have a disability claim court hearing date that is set for August 1st and since then she has gotten a approval decision.The OPT therapist utilized Motivational Interviewing to assist in creating therapeutic repore. The patient in the session was engaged and work in collaboration giving feedback about her triggers and symptoms over the past few weeks through August. The OPT therapist worked with the patient on setting a goals of being more active and changing environment more frequently with milder weather through August into September for the Fall season. The patient work with the OPT therapist utilized Engineer, manufacturing systems Therapy through cognitive restructuring.The OPT therapist worked with the patient utilizing the in my control vs not in my control exercise and work to identify  automatic negative thoughts. The OPT therapist worked with the patient  on coping skills for both indoor and outdoor.  The OPT therapist gained feedback for holistic care around the patients current med therapy response to which she indicated ongoing benefit of helping to reduce her passive S/I thoughts.The OPT therapist overviewed with the patient upcoming appointments listed in the patient MyChart.     Suicidal/Homicidal: Nowithout intent/plan   Therapist Response: The OPT therapist worked with the patient for the patients scheduled session. The patient was engaged in her session and gave feedback in relation to triggers, symptoms, and behavior responses over the past few weeks. The patient spoke about recovering from a stomach bug she had at the beginning of August. The patient spoke about her getting an approval for her disability claim and this helping to reduce financial stress.. The OPT therapist continued to encourage the patient once she is feeling better to get out of the home more into the community. The OPT therapist worked with the patient in review of basic need areas with incorporation of adding a goal of starting some form of physical exercise ; this starting with low increments and low impact and adjusting based on patient body response with emphasis on the exercise being within her physical health limits and safe.The OPT therapist worked with the patient utilizing an in session Cognitive Behavioral Therapy exercise and focused with the patient on her interactions with other family members . The OPT therapist reviewed with the patient supports and coping strategies including change of environment and getting out of the home more over the next few days. The OPT therapist worked with the patient on identifying changes she can make that are within her control. The patient  spoke about her willingness to try to challenge negative thoughts, limit her involvement with negativity and continue to use  support network as well as continue to strive for more independence in her daily living skills .The patient continues to work with her physical health providers.    Plan: Return again in 3/4 weeks.   Diagnosis:      Axis I: Recurrent Moderate MDD with Anxiety                            Axis II: No diagnosis   Collaboration of Care: Collaboration of care in review of patient health care and ongoing treatment work with her health providers and psychiatrist Dr.Ross   Patient/Guardian was advised Release of Information must be obtained prior to any record release in order to collaborate their care with an outside provider. Patient/Guardian was advised if they have not already done so to contact the registration department to sign all necessary forms in order for us  to release information regarding their care.    Consent: Patient/Guardian gives verbal consent for treatment and assignment of benefits for services provided during this visit. Patient/Guardian expressed understanding and agreed to proceed.     I discussed the assessment and treatment plan with the patient. The patient was provided an opportunity to ask questions and all were answered. The patient agreed with the plan and demonstrated an understanding of the instructions.   The patient was advised to call back or seek an in-person evaluation if the symptoms worsen or if the condition fails to improve as anticipated.   I provided 45 minutes of non-face-to-face time during this encounter.   Jerel ONEIDA Pepper, LCSW   06/23/2024

## 2024-06-30 ENCOUNTER — Encounter: Payer: Self-pay | Admitting: Dermatology

## 2024-07-06 ENCOUNTER — Other Ambulatory Visit: Payer: Self-pay

## 2024-07-07 ENCOUNTER — Ambulatory Visit (INDEPENDENT_AMBULATORY_CARE_PROVIDER_SITE_OTHER): Admitting: Dermatology

## 2024-07-07 ENCOUNTER — Encounter: Payer: Self-pay | Admitting: Dermatology

## 2024-07-07 VITALS — BP 125/81 | HR 98 | Temp 98.7°F

## 2024-07-07 DIAGNOSIS — L7211 Pilar cyst: Secondary | ICD-10-CM | POA: Diagnosis not present

## 2024-07-07 DIAGNOSIS — D485 Neoplasm of uncertain behavior of skin: Secondary | ICD-10-CM

## 2024-07-07 NOTE — Patient Instructions (Signed)
 Important Information   Due to recent changes in healthcare laws, you may see results of your pathology and/or laboratory studies on MyChart before the doctors have had a chance to review them. We understand that in some cases there may be results that are confusing or concerning to you. Please understand that not all results are received at the same time and often the doctors may need to interpret multiple results in order to provide you with the best plan of care or course of treatment. Therefore, we ask that you please give us  2 business days to thoroughly review all your results before contacting the office for clarification. Should we see a critical lab result, you will be contacted sooner.     If You Need Anything After Your Visit   If you have any questions or concerns for your doctor, please call our main line at [phone]. If no one answers, please leave a voicemail as directed and we will return your call as soon as possible. Messages left after 4 pm will be answered the following business day.    You may also send us  a message via MyChart. We typically respond to MyChart messages within 1-2 business days.  For prescription refills, please ask your pharmacy to contact our office. Our fax number is [phone].  If you have an urgent issue when the clinic is closed that cannot wait until the next business day, you can page your doctor at the number below.     Please note that while we do our best to be available for urgent issues outside of office hours, we are not available 24/7.    If you have an urgent issue and are unable to reach us , you may choose to seek medical care at your doctor's office, retail clinic, urgent care center, or emergency room.   If you have a medical emergency, please immediately call 911 or go to the emergency department. In the event of inclement weather, please call our main line at [phone] for an update on the status of any delays or  closures.  Dermatology Medication Tips: Please keep the boxes that topical medications come in in order to help keep track of the instructions about where and how to use these. Pharmacies typically print the medication instructions only on the boxes and not directly on the medication tubes.   If your medication is too expensive, please contact our office at [phone] or send us  a message through MyChart.    We are unable to tell what your co-pay for medications will be in advance as this is different depending on your insurance coverage. However, we may be able to find a substitute medication at lower cost or fill out paperwork to get insurance to cover a needed medication.    If a prior authorization is required to get your medication covered by your insurance company, please allow us  1-2 business days to complete this process.   Drug prices often vary depending on where the prescription is filled and some pharmacies may offer cheaper prices.   The website www.goodrx.com contains coupons for medications through different pharmacies. The prices here do not account for what the cost may be with help from insurance (it may be cheaper with your insurance), but the website can give you the price if you did not use any insurance.  - You can print the associated coupon and take it with your prescription to the pharmacy.  - You may also stop by our office during regular  business hours and pick up a GoodRx coupon card.  - If you need your prescription sent electronically to a different pharmacy, notify our office through [facility] or by phone at [phone]

## 2024-07-07 NOTE — Progress Notes (Signed)
 Follow-Up Visit   Subjective  Dana Bradley is a 53 y.o. female who presents for the following: Excision of a Neoplasm of skin on the right mid back.   The following portions of the chart were reviewed this encounter and updated as appropriate: medications, allergies, medical history  Review of Systems:  No other skin or systemic complaints except as noted in HPI or Assessment and Plan.  Objective  Well appearing patient in no apparent distress; mood and affect are within normal limits.  A focused examination was performed of the following areas: Right mid back Relevant physical exam findings are noted in the Assessment and Plan.   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 Skin excision  Excision method:  elliptical Lesion length (cm):  1.9 Lesion width (cm):  1.5 Margin per side (cm):  0.1 Total excision diameter (cm):  2.1 Informed consent: discussed and consent obtained   Timeout: patient name, date of birth, surgical site, and procedure verified   Procedure prep:  Patient was prepped and draped in usual sterile fashion Prep type:  Chlorhexidine Anesthesia: the lesion was anesthetized in a standard fashion   Anesthetic:  1% lidocaine  w/ epinephrine  1-100,000 buffered w/ 8.4% NaHCO3 Instrument used: #15 blade   Hemostasis achieved with: suture, pressure and electrodesiccation   Outcome: patient tolerated procedure well with no complications   Post-procedure details: sterile dressing applied and wound care instructions given   Dressing type: bandage and pressure dressing    Skin repair Complexity:  Complex Final length (cm):  3.1 Informed consent: discussed and consent obtained   Timeout: patient name, date of birth, surgical site, and procedure verified   Procedure prep:  Patient was prepped and draped in usual sterile fashion Prep type:  Chlorhexidine Anesthesia: the lesion was anesthetized  in a standard fashion   Anesthetic:  1% lidocaine  w/ epinephrine  1-100,000 buffered w/ 8.4% NaHCO3 Reason for type of repair: reduce tension to allow closure, allow closure of the large defect, preserve normal anatomy, preserve normal anatomical and functional relationships and avoid adjacent structures   Undermining: area extensively undermined   Subcutaneous layers (deep stitches):  Suture size:  3-0 Suture type: PDS (polydioxanone)   Stitches:  Buried vertical mattress Fine/surface layer approximation (top stitches):  Suture type: cyanoacrylate tissue glue   Hemostasis achieved with: suture, pressure and electrodesiccation Outcome: patient tolerated procedure well with no complications   Post-procedure details: sterile dressing applied and wound care instructions given   Dressing type: bandage and pressure dressing    Specimen 1 - Surgical pathology Differential Diagnosis: R/O cyst vs lipoma vs other  Check Margins: No  The surgical wound was then cleaned, prepped, and re-anesthetized as above. Wound edges were undermined extensively along at least one entire edge and at a distance equal to or greater than the width of the defect (see wound defect size above) in order to achieve closure and decrease wound tension and anatomic distortion. Redundant tissue repair including standing cone removal was performed. Hemostasis was achieved with electrocautery. Subcutaneous and epidermal tissues were approximated with the above sutures. The surgical site was then lightly scrubbed with sterile, saline-soaked gauze. Steri-strips were applied, and the area was then bandaged using Vaseline ointment, non-adherent gauze, gauze pads, and tape to provide an adequate pressure dressing. The patient tolerated the procedure well, was given detailed written and verbal wound care instructions, and was discharged in good condition.   The  patient will follow-up: PRN.  Return if symptoms worsen or fail to  improve.  I, Berwyn Lesches, Surg Tech III, am acting as scribe for RUFUS CHRISTELLA HOLY, MD.   Documentation: I have reviewed the above documentation for accuracy and completeness, and I agree with the above.  RUFUS CHRISTELLA HOLY, MD

## 2024-07-08 LAB — SURGICAL PATHOLOGY

## 2024-07-09 ENCOUNTER — Ambulatory Visit: Payer: Self-pay | Admitting: Dermatology

## 2024-07-13 ENCOUNTER — Other Ambulatory Visit: Payer: Self-pay

## 2024-07-13 ENCOUNTER — Ambulatory Visit: Admitting: Physical Medicine and Rehabilitation

## 2024-07-14 ENCOUNTER — Telehealth: Payer: Self-pay

## 2024-07-14 NOTE — Telephone Encounter (Signed)
 PA for Qulipta  created in cover my meds.

## 2024-07-15 ENCOUNTER — Other Ambulatory Visit: Payer: Self-pay

## 2024-07-19 ENCOUNTER — Other Ambulatory Visit: Payer: Self-pay

## 2024-07-19 NOTE — Progress Notes (Signed)
 Specialty Pharmacy Refill Coordination Note  Dana Bradley is a 53 y.o. female contacted today regarding refills of specialty medication(s) Eptinezumab -jjmr (Vyepti )   Patient requested Courier to Provider Office   Delivery date: 08/02/24   Verified address: [facility] [address]   Medication will be filled on 07/30/24.

## 2024-07-20 ENCOUNTER — Ambulatory Visit (HOSPITAL_COMMUNITY): Admitting: Clinical

## 2024-07-20 DIAGNOSIS — F331 Major depressive disorder, recurrent, moderate: Secondary | ICD-10-CM | POA: Diagnosis not present

## 2024-07-20 DIAGNOSIS — F419 Anxiety disorder, unspecified: Secondary | ICD-10-CM | POA: Diagnosis not present

## 2024-07-20 NOTE — Progress Notes (Signed)
 Virtual Visit via Telephone Note  I connected with [name] on 07/20/24 at 10:00 AM EDT by telephone and verified that I am speaking with the correct person using two identifiers.  Location: Patient: home Provider: office    I discussed the limitations, risks, security and privacy concerns of performing an evaluation and management service by telephone and the availability of in person appointments. I also discussed with the patient that there may be a patient responsible charge related to this service. The patient expressed understanding and agreed to proceed.    THERAPIST PROGRESS NOTE   Session Time: 10:00 AM-10:30 AM   Participation Level: Active   Behavioral Response: CasualAlertDepressed   Type of Therapy: Individual Therapy   Treatment Goals addressed: Anger Mood Management and Coping   Interventions: CBT, Motivational Interviewing, Solution Focused and Strength-based   Summary: Dana Bradley is a 53 y.o. female who presents with Recurrent Moderate MDD with Anxiety. The OPT therapist worked with the patient for her scheduled OPT treatment.The patient reported calmer last few weeks with no additional reports of falls, injuries, or ER visits. The patient spoke about her ongoing stress from her daughters health situation.The patient spoke about having difficulty with people living in the home her son and husband completing tasks that she is delegating and noted this is at a point in which she needs to have these things change .The OPT therapist utilized Motivational Interviewing to assist in creating therapeutic repore. The patient in the session was engaged and work in collaboration giving feedback about her triggers and symptoms over the past few weeks through October. The OPT therapist worked with the patient on setting a goals of being more active and changing environment more frequently with milder weather through October for the Fall season. The patient spoke about her sister  moving in taking over the responsibilities and caregiving and if this is the case she will transfer the money being paid for caregiver to her sister from her husband. The patient work with the OPT therapist utilized Engineer, manufacturing systems Therapy through cognitive restructuring.The OPT therapist worked with the patient utilizing the in my control vs not in my control exercise and work to identify automatic negative thoughts. The OPT therapist worked with the patient  on coping skills for both indoor and outdoor.  The OPT therapist gained feedback for holistic care around the patients current med therapy response to which she indicated ongoing benefit of helping to reduce her passive S/I thoughts. The OPT therapist worked with the patient overviewing her med therapy needs with upcoming schedule appointment with Shavon Rankin 10/18/2024.The OPT therapist overviewed with the patient upcoming appointments listed in the patient MyChart.     Suicidal/Homicidal: Nowithout intent/plan   Therapist Response: The OPT therapist worked with the patient for the patients scheduled session. The patient was engaged in her session and gave feedback in relation to triggers, symptoms, and behavior responses over the past few weeks. The patient spoke about changes with her assigned care to her sister from her husband and notes that this potential change will motivate her husband to be more active in completing the assigned caregiver tasks. The patient notes her sister will be moving in ideally by Halloween. The patient spoke about having a upcoming operation with having a back stimulator put in on Halloween.  The OPT therapist continued to encourage the patient once she is feeling better to get out of the home more into the community. The OPT therapist worked with the patient in review of  basic need areas with incorporation of adding a goal of starting some form of physical exercise ; this starting with low increments and low impact  and adjusting based on patient body response with emphasis on the exercise being within her physical health limits and safe.The OPT therapist worked with the patient utilizing an in session Cognitive Behavioral Therapy exercise and focused with the patient on her interactions with other family members . The OPT therapist reviewed with the patient supports and coping strategies including change of environment and getting out of the home more over the next few days. The OPT therapist worked with the patient on identifying changes she can make that are within her control and trying to delegate with her support system. The patient spoke about her willingness to try to challenge negative thoughts, limit her involvement with negativity and continue to use support network as well as continue to strive for more independence in her daily living skills .The patient continues to work with her physical health providers. The OPT therapist worked with the patient on managing in home stressors based on the patients current at home dynamics.   Plan: Return again in 3/4 weeks.   Diagnosis:      Axis I: Recurrent Moderate MDD with Anxiety                            Axis II: No diagnosis   Collaboration of Care: Collaboration of care in review of patient health care and ongoing treatment work with her health providers and psychiatrist Shavon Rankin   Patient/Guardian was advised Release of Information must be obtained prior to any record release in order to collaborate their care with an outside provider. Patient/Guardian was advised if they have not already done so to contact the registration department to sign all necessary forms in order for us  to release information regarding their care.    Consent: Patient/Guardian gives verbal consent for treatment and assignment of benefits for services provided during this visit. Patient/Guardian expressed understanding and agreed to proceed.     I discussed the assessment and  treatment plan with the patient. The patient was provided an opportunity to ask questions and all were answered. The patient agreed with the plan and demonstrated an understanding of the instructions.   The patient was advised to call back or seek an in-person evaluation if the symptoms worsen or if the condition fails to improve as anticipated.   I provided 30 minutes of non-face-to-face time during this encounter.   Jerel ONEIDA Pepper, LCSW   07/20/2024

## 2024-07-23 ENCOUNTER — Ambulatory Visit

## 2024-07-29 ENCOUNTER — Encounter: Attending: Physical Medicine and Rehabilitation | Admitting: Physical Medicine and Rehabilitation

## 2024-07-29 ENCOUNTER — Encounter: Payer: Self-pay | Admitting: Physical Medicine and Rehabilitation

## 2024-07-29 VITALS — BP 124/84 | HR 110 | Ht 63.0 in | Wt 227.0 lb

## 2024-07-29 DIAGNOSIS — E66813 Obesity, class 3: Secondary | ICD-10-CM | POA: Insufficient documentation

## 2024-07-29 DIAGNOSIS — M545 Low back pain, unspecified: Secondary | ICD-10-CM | POA: Diagnosis not present

## 2024-07-29 DIAGNOSIS — G8929 Other chronic pain: Secondary | ICD-10-CM | POA: Insufficient documentation

## 2024-07-29 DIAGNOSIS — K5900 Constipation, unspecified: Secondary | ICD-10-CM | POA: Insufficient documentation

## 2024-07-29 DIAGNOSIS — G43809 Other migraine, not intractable, without status migrainosus: Secondary | ICD-10-CM | POA: Insufficient documentation

## 2024-07-29 MED ORDER — BUPROPION HCL ER (XL) 150 MG PO TB24: 150.0000 mg | ORAL_TABLET | Freq: Every day | ORAL | 3 refills | Status: DC

## 2024-07-29 NOTE — Progress Notes (Signed)
 Subjective:    Patient ID: Dana Bradley, female    DOB: 12/24/1970, 53 y.o.   MRN: [id number]  HPI   1) Chronic Migraine:  -Dana Bradley has been helping -she was having a migraine every day while her daughter was receiving radiation -she is due for her Vyepti  soon and this lasts for some times until it wears off -she took Fioricet [facility] and these typically happen -light sensitivity is severe -gone when she wakes in the morning -not eating a lot of gluten, bread -she needs an appointment at the infusion center -has only had 3 non-migraines days since Thanksgiving -stopped progesterone in March, she feels that this was helping in combination with the different medications -Dana Bradley us  a 53 year old woman who presents for f/u of her chronic migraine  -discussed her food allergy test results -took the Nurtec and the Fioricet last night and her migraine got better -she thinks she been dehydrated -the Dana Bradley has helped -she thinks the Vyepti  has helped  -migraines have been decent over the last few weeks -she thinks Vyepti  helps -she is trying not to be stressed -Dana Bradley went to the cardiologist today and they are doing a cardiac MRI, and her fatigue is worse, she was told her heart is not pumping well. --she would like to try Zavzpret  -she has ordered Bioptemizer's magnesium -she has been having stress due to her mother's Alzheimer's disease -she has been very fatigued during the day -she cannot be in light -on Monday she had to wear an eye mask in a darkened room. -Migraines will not get better unless she takes the Fioricet with the maxaalt but it puts her to sleepy -she feels she is getting a migraine every day -the Fioricet and Nurtec or Maxaalt together give her relief, but not complete relief.  -the days run into each other and its hard to remember the differences between them -she has been stressed in caring for her mother -she has noted recently that when she lays  flat she feels fine but as soon as she sits up she feels an excruciating migraine -migraine continues to worsen from sitting to standing position -her daughter has similar symptoms but felt better after starting acetazolamide for presumed diagnosis of intracranial hypertension. She asks whether she may benefit from this medication as well.  Terrible stomach spasms and diarrhea. 12 hours later debilitating migraine. It usually starts in the afternoon. She feels that this may be part of her prodrome. She also has terrible vertigo that is getting worse. The Fiorcet helps a lot, the Migronal helps with the frontal headache. These make the pain bearable.  -having migraines close to every 24H -she is not functional -she is not sure if Maxaalt is helping. She is not taking with the Nurtec. It is helping some but she is not sure how much. The fioricet alone helps by putting her to sleep.  -she has not heart any news about the Vypeti infusion. The medication was approved  -has been having more dizziness.  -the pain has been worst since she had the occipital nerve blocks.  -she knows it is stress related -she feels a pain radiating down her arm as well. It also runs into her head and into her face.  -Sometimes her whole left side is fizzy.  -she has tried Amitriptyline .  -have been awful since December. Had a migraine from Dec 1st throughout 9th.  -the day before she was incapacitated.  -Vyepti  has been approved -The  nurtec and the Fiorocet help together to break the migraine.  -she needs a refill of her Fioricet and nausea, she finds this helpful.  -Nurtec- she is still working with the company to get this improved -she asks about prodrome and postdrome (she experiences cramping and diarrhea).  -on Sunday she closed out a 56 hour migraines. -she has been trying to follow ketogenic diet.  -Nurtec helping- 5 days in a row without a migraines.  -she feels if she could take it as a rescue medication in  addition to every other day this may help.   2) Vertigo -Symptoms are worsening.  -She feels that this may be leading up to the vertigo.  -She is interested in vestibular therapy.  -she had a heating pad on the shoulder and arm.   3) Cervical myofasical pain syndrome: -Muscles of neck are very tight, pain is worse on the left side.  -She did not find spinal injections helpful, they made the pain worse.   4) Insomnia: she sleeps in chunks -she is willing to increase amitriptyline  to 50mg .   5) Cognitive impairments: -she loves to cook and noticed she has been making more mistakes -she thinks the headaches are a problem.  6) Pain radiating from back into legs -feels twitches -worse at night -legs kick out at night.   7) IBS -terrible in the night -2am on Sunday. -horrible pain -often with diarrhea and improves symptoms after she has diarrhea.  -cramping.  -last normal BM is 1/9 -having abdominal distension -magnesium citrate does not help -miralax multiple times per day do not help  8) Menstrual bleeding: -has been present since 12/11 -she was started on progesterone a few days before that date  9) depression: -she has dry mouth will wellbutrin  and did not find much efficacy. She asked if there is an alternative medication she can try -she had tried Zoloft  in the past with benefits -she is going to pick up the Zoloft  today and try it tonight -she would be willing to take vitamin D   10) Low back pain: -radiates into her feet -not a good surgical candidate -she feels she will be wheelchair bound soon -she is going to be receiving her spinal cord stimulator placed tomorrow  11. Stress: -she is stressed that Dana Bradley as a pleural lesion  Dana Bradley is a 53 year old woman who presents for follow-up of her chronic migraine. Her average pain is 6/10 and pain right now is 6/10. Her pain is intermittent, sharp, burning, stabbing, tingling, and aching.   She recently had 7  days without migraine. She is not sure what was associated with this. She had a pretty terrible bounceback.  She picked up the Migranal  nasal spray. Our CMA explained how it works.   Her pain doctor is going to stop prescribing the Lyrica  and she needs to find a new doctor to prescribe this.   12) Impaired balance: -she was trying to trim trees the other day and had to keep catching herself with the pruners -she is also started to having sharp pains on the right side as well as the left side  13) obesity:  -started Ozempic  two months ago   Pain Inventory Average Pain 65 My pain is intermittent, constant, sharp, burning, stabbing, tingling, and aching  In the last 24 hours, has pain interfered with the following? General activity 8 Relation with others 7 Enjoyment of life 8 What TIME of day is your pain at its worst? morning , daytime,  and evening Sleep (in general) Poor  Pain is worse with: walking, bending, and some activites Pain improves with: rest and medication Relief from Meds:6    Family History  Problem Relation Age of Onset   Depression Mother    Hypertension Mother    Cancer Mother        Skin   Hyperlipidemia Mother    Anxiety disorder Mother    Migraines Mother    Alcohol abuse Father    Depression Father    Stroke Father    Heart disease Father    Hypertension Father    Anxiety disorder Father    Depression Sister    Hyperlipidemia Sister    Diabetes Sister    Hypertension Sister    Polycystic ovary syndrome Sister    Bipolar disorder Sister    Anxiety disorder Sister    Migraines Sister    Depression Sister    Hypertension Sister    Anxiety disorder Sister    Migraines Sister    Breast cancer Maternal Grandmother 60   Cancer Maternal Grandmother 29       Breast   Thyroid  disease Maternal Grandmother    Arthritis Maternal Grandmother    Hyperlipidemia Maternal Grandmother    Aneurysm Maternal Grandfather    Hypertension Maternal  Grandfather    Heart disease Maternal Grandfather    Alzheimer's disease Paternal Grandmother    Heart attack Paternal Grandfather    Hypertension Paternal Grandfather    COPD Paternal Grandfather    Heart disease Paternal Grandfather    Migraines Daughter    Other Daughter        leomyoa scaroma   Migraines Daughter    Migraines Son    Alzheimer's disease Other    Bladder Cancer Neg Hx    Kidney cancer Neg Hx    Social History   Socioeconomic History   Marital status: Married    Spouse name: brian   Number of children: 3   Years of education: Not on file   Highest education level: Associate degree: academic program  Occupational History   Occupation: disbled    Comment: not able  Tobacco Use   Smoking status: Former    Current packs/day: 0.00    Average packs/day: 2.0 packs/day for 3.0 years (6.0 ttl pk-yrs)    Types: Cigarettes    Start date: 12/10/1989    Quit date: 12/10/1992    Years since quitting: 31.6   Smokeless tobacco: Never   Tobacco comments:    quit 25 years ago  Vaping Use   Vaping status: Never Used  Substance and Sexual Activity   Alcohol use: No    Comment: socially   Drug use: No   Sexual activity: Not Currently    Partners: Male    Birth control/protection: Surgical  Other Topics Concern   Not on file  Social History Narrative   Lives at home with her husband and 2 of her children   Right handed   Caffeine : 0-2 cups daily   Social Drivers of Health   Financial Resource Strain: Low Risk  (06/15/2024)   Overall Financial Resource Strain (CARDIA)    Difficulty of Paying Living Expenses: Not very hard  Food Insecurity: No Food Insecurity (06/15/2024)   Hunger Vital Sign    Worried About Running Out of Food in the Last Year: Never true    Ran Out of Food in the Last Year: Never true  Transportation Needs: No Transportation Needs (06/15/2024)   PRAPARE -  Administrator, Civil Service (Medical): No    Lack of Transportation  (Non-Medical): No  Physical Activity: Inactive (06/15/2024)   Exercise Vital Sign    Days of Exercise per Week: 0 days    Minutes of Exercise per Session: 0 min  Stress: No Stress Concern Present (06/15/2024)   [employer] of Occupational Health - Occupational Stress Questionnaire    Feeling of Stress: Only a little  Social Connections: Moderately Integrated (06/15/2024)   Social Connection and Isolation Panel    Frequency of Communication with Friends and Family: More than three times a week    Frequency of Social Gatherings with Friends and Family: Never    Attends Religious Services: More than 4 times per year    Active Member of Clubs or Organizations: No    Attends Engineer, Structural: Never    Marital Status: Married   Past Surgical History:  Procedure Laterality Date   ABLATION     Uterine   BREAST BIOPSY Left    2014 U/S bx fibroadenoma   BREAST BIOPSY Left 05/28/2023   lr us  bx 3:00 heart clip   BREAST BIOPSY Left 05/28/2023   US  LT BREAST BX W LOC DEV 1ST LESION IMG BX SPEC US  GUIDE 05/28/2023 [facility]   CARDIAC CATHETERIZATION     with ablation   COLONOSCOPY N/A 09/26/2022   Procedure: COLONOSCOPY;  Surgeon: Therisa Bi, MD;  Location: [facility] ENDOSCOPY;  Service: Gastroenterology;  Laterality: N/A;   COLONOSCOPY WITH PROPOFOL  N/A 05/17/2015   Procedure: COLONOSCOPY WITH PROPOFOL ;  Surgeon: Lamar ONEIDA Holmes, MD;  Location: [facility] ENDOSCOPY;  Service: Endoscopy;  Laterality: N/A;   COLONOSCOPY WITH PROPOFOL  N/A 03/20/2020   Procedure: COLONOSCOPY WITH PROPOFOL ;  Surgeon: Therisa Bi, MD;  Location: [facility] ENDOSCOPY;  Service: Gastroenterology;  Laterality: N/A;   COLONOSCOPY WITH PROPOFOL  N/A 09/25/2022   Procedure: COLONOSCOPY WITH PROPOFOL ;  Surgeon: Therisa Bi, MD;  Location: [facility] ENDOSCOPY;  Service: Gastroenterology;  Laterality: N/A;   COLONOSCOPY WITH PROPOFOL  N/A 10/02/2022   Procedure: COLONOSCOPY WITH PROPOFOL ;  Surgeon: Therisa Bi, MD;   Location: [facility] ENDOSCOPY;  Service: Gastroenterology;  Laterality: N/A;   COLONOSCOPY WITH PROPOFOL  N/A 11/13/2022   Procedure: COLONOSCOPY WITH PROPOFOL ;  Surgeon: Therisa Bi, MD;  Location: [facility] ENDOSCOPY;  Service: Gastroenterology;  Laterality: N/A;   ESOPHAGOGASTRODUODENOSCOPY N/A 05/17/2015   Procedure: ESOPHAGOGASTRODUODENOSCOPY (EGD);  Surgeon: Lamar ONEIDA Holmes, MD;  Location: [facility] ENDOSCOPY;  Service: Endoscopy;  Laterality: N/A;   ESOPHAGOGASTRODUODENOSCOPY N/A 09/25/2022   Procedure: ESOPHAGOGASTRODUODENOSCOPY (EGD);  Surgeon: Therisa Bi, MD;  Location: [facility] ENDOSCOPY;  Service: Gastroenterology;  Laterality: N/A;   ESOPHAGOGASTRODUODENOSCOPY N/A 09/26/2022   Procedure: ESOPHAGOGASTRODUODENOSCOPY (EGD);  Surgeon: Therisa Bi, MD;  Location: [facility] ENDOSCOPY;  Service: Gastroenterology;  Laterality: N/A;   KNEE ARTHROSCOPY     spg     6/18   Tibial Tubercle Bypass Right 1998   TUBAL LIGATION  10/01/1999   Past Surgical History:  Procedure Laterality Date   ABLATION     Uterine   BREAST BIOPSY Left    2014 U/S bx fibroadenoma   BREAST BIOPSY Left 05/28/2023   lr us  bx 3:00 heart clip   BREAST BIOPSY Left 05/28/2023   US  LT BREAST BX W LOC DEV 1ST LESION IMG BX SPEC US  GUIDE 05/28/2023 [facility]   CARDIAC CATHETERIZATION     with ablation   COLONOSCOPY N/A 09/26/2022   Procedure: COLONOSCOPY;  Surgeon: Therisa Bi, MD;  Location: [facility] ENDOSCOPY;  Service: Gastroenterology;  Laterality: N/A;  COLONOSCOPY WITH PROPOFOL  N/A 05/17/2015   Procedure: COLONOSCOPY WITH PROPOFOL ;  Surgeon: Lamar ONEIDA Holmes, MD;  Location: [facility] ENDOSCOPY;  Service: Endoscopy;  Laterality: N/A;   COLONOSCOPY WITH PROPOFOL  N/A 03/20/2020   Procedure: COLONOSCOPY WITH PROPOFOL ;  Surgeon: Therisa Bi, MD;  Location: [facility] ENDOSCOPY;  Service: Gastroenterology;  Laterality: N/A;   COLONOSCOPY WITH PROPOFOL  N/A 09/25/2022   Procedure: COLONOSCOPY WITH PROPOFOL ;  Surgeon: Therisa Bi, MD;  Location:  [facility] ENDOSCOPY;  Service: Gastroenterology;  Laterality: N/A;   COLONOSCOPY WITH PROPOFOL  N/A 10/02/2022   Procedure: COLONOSCOPY WITH PROPOFOL ;  Surgeon: Therisa Bi, MD;  Location: [facility] ENDOSCOPY;  Service: Gastroenterology;  Laterality: N/A;   COLONOSCOPY WITH PROPOFOL  N/A 11/13/2022   Procedure: COLONOSCOPY WITH PROPOFOL ;  Surgeon: Therisa Bi, MD;  Location: [facility] ENDOSCOPY;  Service: Gastroenterology;  Laterality: N/A;   ESOPHAGOGASTRODUODENOSCOPY N/A 05/17/2015   Procedure: ESOPHAGOGASTRODUODENOSCOPY (EGD);  Surgeon: Lamar ONEIDA Holmes, MD;  Location: [facility] ENDOSCOPY;  Service: Endoscopy;  Laterality: N/A;   ESOPHAGOGASTRODUODENOSCOPY N/A 09/25/2022   Procedure: ESOPHAGOGASTRODUODENOSCOPY (EGD);  Surgeon: Therisa Bi, MD;  Location: [facility] ENDOSCOPY;  Service: Gastroenterology;  Laterality: N/A;   ESOPHAGOGASTRODUODENOSCOPY N/A 09/26/2022   Procedure: ESOPHAGOGASTRODUODENOSCOPY (EGD);  Surgeon: Therisa Bi, MD;  Location: [facility] ENDOSCOPY;  Service: Gastroenterology;  Laterality: N/A;   KNEE ARTHROSCOPY     spg     6/18   Tibial Tubercle Bypass Right 1998   TUBAL LIGATION  10/01/1999   Past Medical History:  Diagnosis Date   Acute postoperative pain 04/07/2017   Anxiety    Bursitis    Chronic fatigue 12/12/2017   Chronic fatigue syndrome    Colitis 2021   Diabetes mellitus without complication (HCC)    Edema leg 05/02/2015   Fibromyalgia    GERD (gastroesophageal reflux disease)    IBS (irritable bowel syndrome)    Intractable migraine with aura without status migrainosus    Knee pain, bilateral 12/21/2008   Qualifier: Diagnosis of  By: Gladis FNP, Nykedtra     Lumbar discitis    Migraines    Osteoarthritis    Right hand pain 04/10/2015   [facility] has done nerve conduction studies and ruled out carpal tunnel.    Sleep apnea    Spinal stenosis    SVT (supraventricular tachycardia)    Vertigo    Vitamin D  deficiency 05/01/2016   BP 124/84   Pulse (!) 110   Ht 5'  3 (1.6 m)   Wt 227 lb (103 kg)   SpO2 97%   BMI 40.21 kg/m   Opioid Risk Score:   Fall Risk Score:  `1  Depression screen [facility] 2/9     07/29/2024   10:10 AM 06/15/2024   11:07 AM 05/24/2024   10:52 AM 04/12/2024   10:53 AM 11/10/2023    4:06 PM 10/09/2023    2:16 PM 10/06/2023   11:59 AM  Depression screen PHQ 2/9  Decreased Interest 1 3  1 3  0 1  Down, Depressed, Hopeless 1 3  1 3  0 1  PHQ - 2 Score 2 6  2 6  0 2  Altered sleeping  3   3    Tired, decreased energy  3   3    Change in appetite  2   3    Feeling bad or failure about yourself   3   3    Trouble concentrating  0   3    Moving slowly or fidgety/restless  0   0    Suicidal thoughts  0   0    PHQ-9 Score  17   21    Difficult doing work/chores            Information is confidential and restricted. Go to Review Flowsheets to unlock data.   Review of Systems  Constitutional: Negative.   HENT: Negative.    Eyes: Negative.   Respiratory: Negative.    Cardiovascular: Negative.   Gastrointestinal: Negative.   Endocrine: Negative.   Genitourinary: Negative.   Musculoskeletal:  Positive for back pain and neck pain.       Left side shoulder pain down to upper left arm  Skin: Negative.   Allergic/Immunologic: Negative.   Neurological:  Positive for headaches.  Hematological: Negative.   Psychiatric/Behavioral: Negative.    All other systems reviewed and are negative.      Objective:   Physical Exam Gen: no distress, normal appearing HEENT: oral mucosa pink and moist, NCAT Cardio: Reg rate Chest: normal effort, normal rate of breathing Abd: soft, non-distended Ext: no edema Psych: pleasant, normal affect Skin: intact Neuro: Alert and oriented without focal deficits     Assessment & Plan:  Mrs. Brill is a 53 year old woman who presents for f/u with chronic intractable migraines s/p numerous treatments, severe fibromyalgia, and IBS, and nausea, and vertigo.     1) Vertigo: In the future, restart for  cervical myofascial pain syndrome: myofascial release, postural correction, stretching and strengthening of the muscles of the neck and upper back, development of HEP. Conitnue heating pads to muscles of upper back and neck. She is doing HEP.  -referred for vestibular therapy.  -discussed current symptoms.  -discussed response to meclizine  -discussed worsening symptoms when in car -discussed that she feels impaired balance -discussed that she defers MRI of the brain -discussed that this has resolved   2) Migraines:  -messaged Millicent Blanch to schedule Vyepti  for her -continue Qullipta -discussed food allergy results -recommended methylated B vitamin -refilled Lyrica  and Fioricet -discussed that restarting progesterone may be helpful -increase Qullipta to 60mg  daily -recommended avoiding eggs, dairy, gluten, eating a lot of fruits and vegetables -discussed that highest priority is to eliminate gluten Continue Fioricet, which is one of the medications that helps her. Refilled today. She takes this at least once every time she gets a migraine. Advised to use upon migraine onset and to not use more frequently than q6H during migraine. Refilled Zofran  for nausea last week, and this has been helping her. Continue metoprolol  which can be helpful in migraine prophylaxis (HR is well controlled). Prescribed ergot nasal spray to try upon migraine initiation- advised no more frequently than 4 sprays per hour (still awaiting on prior auth). Discussed avoiding foods that may trigger migraines.  Failed vypeti, ajovy, emgality , continue Vypeti trial, increase Vyepti  to 300mg  -trial of Bioptemizer's magnesium supplement -discussed the elimination diet -prescribed Zavzpret  -continue Qulipta  daily prn.  -continue re-entry phase of elimination diet- she has noted chili pepper and eggs to be triggers for her -failed feverfew supplement -discussed lumbar puncture Does get some benefit from Maxalt  and Fioricet in  combination -takes metoprolol  -ordered 95 item allergy test -discussed increasing Fioricet or Amitriptyline .  -recommended drinking a glass of water every morning before standing up as her symptoms sound like orthostatic hypotension which could worsen migraines -recommended checking blood pressure daily in supine, sitting, and standing positions and this should help us  identify if symptoms are from orthostatic hypotension or intracranial hypertension -discussed trying daily water first before trying medication acetazolamide.  -  continue vitamin D   -Discussed that I can provide refill for her Lyrica  when she needs it.  -topamax  was not effective.  -When she gets the migraines her loss of words is getting worse.  -Ordered Vyepti - she will find out cost from her pharmacy. She will bring paperwork for this next visit. She is excited to try it.  -d/c nurtec since switched to maxalt  -continue ear piercing. Pain feels more like pressure and less acidic.  -no benefits from Botox.  -continue to track migraines.  -Continue Baclofen  for pain relief . Minimize use of Hydrocodone . She is only taking Lyrica  once per day to make it last. Continue Migranal  which is helping. Will occipital nerve block -Discussed that Mrs. Isais's greatest source of happiness is her family. This community will be essential in helping her recover from her chronic pain and to increase her daily activity. Her daughter is also suffering from similar migraines unfortunately.  -Continue ginger, herbs, turmeric, blueberries, eating real food. Continue cutting down sugar. Using local honey is a great alternative. -Provided with a pain relief  journal and discussed that it contains foods and lifestyle tips to naturally help to improve pain. Discussed that these lifestyle strategies are also very good for health unlike some medications which can have negative side effects. Discussed that the act of keeping a journal can be therapeutic and  helpful to realize patterns what helps to trigger and alleviate pain.   -Continue Nurtec every other day- may use for breakthrough migraines as well.  -Discussed plan for Vypeti at [facility] infusion center.  Discussed that exercise is one of the most effective treatments for fibromyalgia. This will also help with her obesity. Made goal with Mrs. Upham to walk outside her home at least once per day, and to garden at least once per day (her favorite activity). Can use elliptical which she has at home on rainy days. The heat has been oppressive and so she has been trying to do the latter more.  Foods to alleviate migraine:  1) dark leafy greens 2) avocado 3) tuna 4) samon and mackerel 5) beans and legumes Supplements that can be helpful: feverfew, B12, and magnesium  Foods to avoid in migraine: 1) Excessive (or irregular timing) coffee 2) red wine 3) aged cheeses 4) chocolate 5) citrus fruits 6) aspartame and other artifical sweeteners 7) yeast 8) MSG (in processed foods) 9) processed and cured meats 10) nuts and certain seeds 11) chicken livers and other organ meats 12) dairy products like buttermilk, sour cream, and yogurt 13) dried fruits like dates, figs, and raisins 14) garlic 15) onions 16) potato chips 17) pickled foods like olives and sauerkraut 18) some fresh fruits like ripe banana, papaya, red plums, raspberries, kiwi, pineapple 19) tomato-based products  Recommend to keep a migraine diary: rate daily the severity of your headache (1-10) and what foods you eat that day to help determine patterns.    3) Cervical facet arthrosis: Cervical XR normal- discussed results with patient. Pain is worse on left side.   4) Anxiety and depression -Her daughter was recently diagnosed with sarcoma and this is a great source of stress and fear to Mrs. Emilio. It has been a turbulent time for her and this has understandably worsened her symptoms. Discussed benefits of gratitude  journaling and she plans to try this.  -discussed her life stressor, her mom's illness -discussed her difficult wean off Pristiq  -prescribed zoloft  -stop Wellbutrin  -prescribed vitamin D  -recommended Bioptemizer's magnesium breakthrough -recommended methylated multivitamin -discussed following  with Dr. Corina.  -increase amitriptyline  to 150mg  HS  5) Insomnia:  -continue amitirptyline to 50mg .  -Try to go outside near sunrise -Get exercise during the day.  -Discussed good sleep hygiene: turning off all devices an hour before bedtime.  -Chamomile tea with dinner.  -Can consider over the counter melatonin -discussed magnesium  6) Nausea:  -continue zofran , discussed side effect of constipation -start B6 -discussed scopolamine  patch, discussed to place behind ear and lave on 72 hours.    7) Cervical myofascial pain syndrome: Will try some trigger point injections with occipital nerve block next visit.  -increase amitriptyline  to 100mg  HS  8) Concern for small bowel obstruction: -stat abdominal MRI ordered given no BM since 1/9.  -recommended to go to [facility] to get this done stat -discussed that it would be beneficial to go to the ED.   9) IBS: -continue kombucha, yogurt (she makes her own)  10) Trigeminal Neuralgia: - discontinue Carbamazepine  since not helping -continue turmeric  11) Lower back pain:  -discussed that she will be having spinal cord stimulator placed tomorrow  -discussed that Journavx  is a highly selective inhibitor for Nav 1.8, which is specific for pain in the peripheral nervous system, discussed that lidocaine  in contrast affects all Nav receptors, discussed loading dose is 2 50mg  tablets and this can be taken 1 hour before food, discussed that then 50mg  can be taken with out without food q12H until pain is tolerable. Discussed that potential side effects are pruritus, muscle spasms, increase blood creatinine, rash. Discussed that we have samples  available and we have copay cards available. Discussed that the 2 phase 3 clinicial trials sent to the FDA were for abdominoplasty and bunionectomy, discussed that it has been currently studied for 14 days, discussed that it can reduce efficacy of opioids and benzodiazepines, discussed that it has not be studied in severe hepatic impairment. Discussed that it is contraindicated with fluconazole . Discussed that it can interfere with hormonal contraception up to 28 days after use. Discussed that it can decrease fertility, it has not been studied in pregnancy, that it has been present in animal milk during lactation, not recommended for GFR <15, discussed that outpatient if the medication requires a prior auth the copay should be $30 for at least a 60 day supply. The medication may be more likely to be in stock in [facility] and [facility]. 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 Carilyn for [facility] -discussed that she has been told she is no longer a candidate for injections, or surgery of spinal cord stimulator -Discussed Qutenza as an option for neuropathic pain control. Discussed that this is a capsaicin patch, stronger than capsaicin cream. Discussed that it is currently approved for diabetic peripheral neuropathy and post-herpetic neuralgia, but that it has also shown benefit in treating other forms of neuropathy. Provided patient with link to site to learn more about the patch: https://www.clark.biz/. Discussed that the patch would be placed in office and benefits usually last 3 months. Discussed that unintended exposure to capsaicin can cause severe irritation of eyes, mucous membranes, respiratory tract, and skin, but that Qutenza is a local treatment and does not have the systemic side effects of other nerve medications. Discussed that there may be pain, itching, erythema, and decreased sensory function associated with  the application of Qutenza. Side effects usually subside within 1 week. A cold pack of analgesic medications can help with  these side effects. Blood pressure can also be increased due to pain associated with administration of the patch.  unchanged. Shallow broad-based disc protrusion L4-5 unchanged. -continue Norco -continue turmeric Turmeric to reduce inflammation--can be used in cooking or taken as a supplement.  Benefits of turmeric:  -Highly anti-inflammatory  -Increases antioxidants  -Improves memory, attention, brain disease  -Lowers risk of heart disease  -May help prevent cancer  -Decreases pain  -Alleviates depression  -Delays aging and decreases risk of chronic disease  -Consume with black pepper to increase absorption    Turmeric Milk Recipe:  1 cup milk  1 tsp turmeric  1 tsp cinnamon  1 tsp grated ginger (optional)  Black pepper (boosts the anti-inflammatory properties of turmeric).  1 tsp honey   12) Stress: -discussed that her daughter is not sleeping well -discussed her mom's health condition and the stress this causes her  13) Menstrual bleeding: -discussed that she should follow-up with OB/GYN to see if progesterone can be restarted  14. Ataxia: -discussed problem with her motor control when she is walking -handicap placard prescribed as she is unable to ambulate 200 feet without stopping rest -continue elliptical  15. Hydrocephalus: -discussed that given impaired balance, urinary incontinence, and wide based gait it would be beneficial to get an MRI to check for this  16. Constipation:  -discussed that this has improved since she started ozempic  -discussed that if mag citrate does not work I usually recommend an MRI -recommended cucumber juice since celery juice -Provided list of following foods that help with constipation and highlighted a few: 1) prunes- contain high amounts of fiber.  2) apples- has a form of dietary fiber called  pectin that accelerates stool movement and increases beneficial gut bacteria 3) pears- in addition to fiber, also high in fructose and sorbitol which have laxative effect 4) figs- contain an enzyme ficin which helps to speed colonic transit 5) kiwis- contain an enzyme actinidin that improves gut motility and reduces constipation 6) oranges- rich in pectin (like apples) 7) grapefruits- contain a flavanol naringenin which has a laxative effect 8) vegetables- rich in fiber and also great sources of folate, vitamin C, and K 9) artichoke- high in inulin, prebiotic great for the microbiome 10) chicory- increases stool frequency and softness (can be added to coffee) 11) rhubarb- laxative effect 12) sweet potato- high fiber 13) beans, peas, and lentils- contain both soluble and insoluble fiber 14) chia seeds- improves intestinal health and gut flora 15) flaxseeds- laxative effect 16) whole grain rye bread- high in fiber 17) oat bran- high in soluble and insoluble fiber 18) kefir- softens stools -recommended to try at least one of these foods every day.  -drink 6-8 glasses of water per day -walk regularly, especially after meals.   17) obesity:  -discussed benefits of exercise -discussed prioritizing fiber -recommended teas, discussed that she can use her home grown herbs

## 2024-07-30 ENCOUNTER — Other Ambulatory Visit: Payer: Self-pay

## 2024-07-30 DIAGNOSIS — E119 Type 2 diabetes mellitus without complications: Secondary | ICD-10-CM | POA: Diagnosis not present

## 2024-07-30 DIAGNOSIS — Z87891 Personal history of nicotine dependence: Secondary | ICD-10-CM | POA: Diagnosis not present

## 2024-07-30 DIAGNOSIS — Z6839 Body mass index (BMI) 39.0-39.9, adult: Secondary | ICD-10-CM | POA: Diagnosis not present

## 2024-07-30 DIAGNOSIS — Z79899 Other long term (current) drug therapy: Secondary | ICD-10-CM | POA: Diagnosis not present

## 2024-07-30 DIAGNOSIS — E669 Obesity, unspecified: Secondary | ICD-10-CM | POA: Diagnosis not present

## 2024-07-30 DIAGNOSIS — Z7984 Long term (current) use of oral hypoglycemic drugs: Secondary | ICD-10-CM | POA: Diagnosis not present

## 2024-07-30 DIAGNOSIS — M5416 Radiculopathy, lumbar region: Secondary | ICD-10-CM | POA: Diagnosis not present

## 2024-07-30 DIAGNOSIS — F419 Anxiety disorder, unspecified: Secondary | ICD-10-CM | POA: Diagnosis not present

## 2024-07-30 DIAGNOSIS — G4733 Obstructive sleep apnea (adult) (pediatric): Secondary | ICD-10-CM | POA: Diagnosis not present

## 2024-07-30 DIAGNOSIS — Z7985 Long-term (current) use of injectable non-insulin antidiabetic drugs: Secondary | ICD-10-CM | POA: Diagnosis not present

## 2024-08-06 ENCOUNTER — Ambulatory Visit

## 2024-08-06 VITALS — BP 107/72 | HR 91 | Temp 98.4°F | Resp 18 | Ht 63.0 in | Wt 229.4 lb

## 2024-08-06 DIAGNOSIS — G43711 Chronic migraine without aura, intractable, with status migrainosus: Secondary | ICD-10-CM

## 2024-08-06 MED ORDER — SODIUM CHLORIDE 0.9 % IV SOLN
300.0000 mg | Freq: Once | INTRAVENOUS | Status: AC
  Administered 2024-08-06: 300 mg via INTRAVENOUS
  Filled 2024-08-06: qty 3

## 2024-08-06 NOTE — Progress Notes (Signed)
 Diagnosis: Migraines  Provider:  Praveen Mannam MD  Procedure: IV Infusion  IV Type: Peripheral, IV Location: L Upper Arm  Vyepti  (Eptinezumab -jjmr), Dose: 300 mg  Infusion Start Time: 1220  Infusion Stop Time: 1257  Post Infusion IV Care: Peripheral IV Discontinued  Discharge: Condition: Good, Destination: Home . AVS Declined  Performed by:  Eleanor DELENA Bloch, RN

## 2024-08-10 DIAGNOSIS — M5416 Radiculopathy, lumbar region: Secondary | ICD-10-CM | POA: Diagnosis not present

## 2024-08-10 DIAGNOSIS — Z9689 Presence of other specified functional implants: Secondary | ICD-10-CM | POA: Diagnosis not present

## 2024-08-23 ENCOUNTER — Telehealth: Payer: Self-pay

## 2024-08-23 ENCOUNTER — Encounter (HOSPITAL_COMMUNITY): Payer: Self-pay

## 2024-08-23 ENCOUNTER — Ambulatory Visit (INDEPENDENT_AMBULATORY_CARE_PROVIDER_SITE_OTHER): Admitting: Clinical

## 2024-08-23 DIAGNOSIS — F331 Major depressive disorder, recurrent, moderate: Secondary | ICD-10-CM

## 2024-08-23 DIAGNOSIS — F419 Anxiety disorder, unspecified: Secondary | ICD-10-CM

## 2024-08-23 MED ORDER — FREESTYLE LIBRE 3 PLUS SENSOR MISC: 1 refills | Status: AC

## 2024-08-23 NOTE — Progress Notes (Signed)
 Virtual Visit via Video Note  I connected with Dana Bradley on 08/23/24 at 11:00 AM EST by a video enabled telemedicine application and verified that I am speaking with the correct person using two identifiers.  Location: Patient: home Provider: office   I discussed the limitations of evaluation and management by telemedicine and the availability of in person appointments. The patient expressed understanding and agreed to proceed.    THERAPIST PROGRESS NOTE   Session Time: 10:00 AM-10:38 AM   Participation Level: Active   Behavioral Response: CasualAlertDepressed   Type of Therapy: Individual Therapy   Treatment Goals addressed: Anger Mood Management and Coping   Interventions: CBT, Motivational Interviewing, Solution Focused and Strength-based   Summary: Dana Bradley is a 53 y.o. female who presents with Recurrent Moderate MDD with Anxiety. The OPT therapist worked with the patient for her scheduled OPT treatment.The patient reported calmer last few weeks with no additional reports of falls, injuries, or ER visits. The patient spoke about her ongoing stress from her daughters health situation.The patient spoke about having difficulty with people living in the home her son and husband completing tasks that she is delegating better and utilizing her family hold .The OPT therapist utilized Motivational Interviewing to assist in creating therapeutic repore. The patient in the session was engaged and work in collaboration giving feedback about her triggers and symptoms over the past few weeks through October. The OPT therapist worked with the patient on setting a goals of being more active and changing environment more frequently with milder weather through October for the Fall season. The patient spoke about her sister moving in taking over the responsibilities and caregiving and if this is the case she will transfer the money being paid for caregiver to her sister from her husband. The patient  work with the OPT therapist utilized Engineer, Manufacturing Systems Therapy through cognitive restructuring.The OPT therapist worked with the patient utilizing the in my control vs not in my control exercise and work to identify automatic negative thoughts. The OPT therapist worked with the patient  on coping skills for both indoor and outdoor.  The OPT therapist gained feedback for holistic care around the patients current med therapy response to which she indicated ongoing benefit of helping to reduce her passive S/I thoughts. The OPT therapist worked with the patient overviewing her med therapy needs with upcoming schedule appointment with Shavon Rankin 10/18/2024.The OPT therapist overviewed with the patient upcoming appointments listed in the patient MyChart.     Suicidal/Homicidal: Nowithout intent/plan   Therapist Response: The OPT therapist worked with the patient for the patients scheduled session. The patient was engaged in her session and gave feedback in relation to triggers, symptoms, and behavior responses over the past few weeks. The patient spoke about changes with her assigned care to her sister from her husband and notes that this potential change will motivate her husband to be more active in completing the assigned caregiver tasks. The patient notes her sister will be moving in ideally by Halloween. The patient spoke about having a upcoming operation with having a back stimulator put in on Halloween.  The OPT therapist continued to encourage the patient once she is feeling better to get out of the home more into the community. The OPT therapist worked with the patient in review of basic need areas with incorporation of adding a goal of starting some form of physical exercise ; this starting with low increments and low impact and adjusting based on patient body response with  emphasis on the exercise being within her physical health limits and safe.The OPT therapist worked with the patient utilizing an in  session Cognitive Behavioral Therapy exercise and focused with the patient on her interactions with other family members . The OPT therapist reviewed with the patient supports and coping strategies including change of environment and getting out of the home more over the next few days. The OPT therapist worked with the patient on identifying changes she can make that are within her control and trying to delegate with her support system. The patient spoke about her willingness to try to challenge negative thoughts, limit her involvement with negativity and continue to use support network as well as continue to strive for more independence in her daily living skills .The patient continues to work with her physical health providers. The OPT therapist worked with the patient on managing in home stressors based on the patients current at home dynamics.   Plan: Return again in 3/4 weeks.   Diagnosis:      Axis I: Recurrent Moderate MDD with Anxiety                            Axis II: No diagnosis   Collaboration of Care: Collaboration of care in review of patient health care and ongoing treatment work with her health providers and psychiatrist Shavon Rankin   Patient/Guardian was advised Release of Information must be obtained prior to any record release in order to collaborate their care with an outside provider. Patient/Guardian was advised if they have not already done so to contact the registration department to sign all necessary forms in order for us  to release information regarding their care.    Consent: Patient/Guardian gives verbal consent for treatment and assignment of benefits for services provided during this visit. Patient/Guardian expressed understanding and agreed to proceed.     I discussed the assessment and treatment plan with the patient. The patient was provided an opportunity to ask questions and all were answered. The patient agreed with the plan and demonstrated an understanding of  the instructions.   The patient was advised to call back or seek an in-person evaluation if the symptoms worsen or if the condition fails to improve as anticipated.   I provided 38 minutes of non-face-to-face time during this encounter.   Jerel ONEIDA Pepper, LCSW   08/23/2024

## 2024-08-23 NOTE — Telephone Encounter (Signed)
 Copied from CRM [id number]. Topic: Clinical - Prescription Issue >> Aug 23, 2024 12:10 PM Wess RAMAN wrote: Reason for CRM: Patient states they are not making the Continuous Glucose Sensor (FREESTYLE LIBRE) anymore. She needs the Continuous Glucose Sensor (FREESTYLE LIBRE 3 PLUS SENSOR) MISC instead  Callback #: [phone]  Pharmacy: [facility] [place] [place] - [address] [place] [address] Phone: [phone] Fax: [phone] Hours: Not open 24 hours

## 2024-08-24 ENCOUNTER — Other Ambulatory Visit: Payer: Self-pay

## 2024-09-01 DIAGNOSIS — M5416 Radiculopathy, lumbar region: Secondary | ICD-10-CM | POA: Diagnosis not present

## 2024-09-01 DIAGNOSIS — Z9689 Presence of other specified functional implants: Secondary | ICD-10-CM | POA: Diagnosis not present

## 2024-09-06 ENCOUNTER — Other Ambulatory Visit: Payer: Self-pay | Admitting: Obstetrics

## 2024-09-06 DIAGNOSIS — R102 Pelvic and perineal pain unspecified side: Secondary | ICD-10-CM

## 2024-09-06 DIAGNOSIS — N926 Irregular menstruation, unspecified: Secondary | ICD-10-CM

## 2024-09-20 ENCOUNTER — Ambulatory Visit (HOSPITAL_COMMUNITY): Admitting: Clinical

## 2024-09-20 DIAGNOSIS — F419 Anxiety disorder, unspecified: Secondary | ICD-10-CM | POA: Diagnosis not present

## 2024-09-20 DIAGNOSIS — F331 Major depressive disorder, recurrent, moderate: Secondary | ICD-10-CM

## 2024-09-20 NOTE — Progress Notes (Signed)
 Virtual Visit via Video Note   I connected with Dana Bradley on 09/20/24 at 11:00 AM EST by a video enabled telemedicine application and verified that I am speaking with the correct person using two identifiers.   Location: Patient: home Provider: office   I discussed the limitations of evaluation and management by telemedicine and the availability of in person appointments. The patient expressed understanding and agreed to proceed.       THERAPIST PROGRESS NOTE   Session Time: 11:00 AM-11:45 AM   Participation Level: Active   Behavioral Response: CasualAlertDepressed   Type of Therapy: Individual Therapy   Treatment Goals addressed: Anger Mood Management and Coping   Interventions: CBT, Motivational Interviewing, Solution Focused and Strength-based   Summary: Dana Bradley is a 53 y.o. female who presents with Recurrent Moderate MDD with Anxiety. The OPT therapist worked with the patient for her scheduled OPT treatment.The patient reported calmer last few weeks with no additional reports of falls, injuries, or ER visits.The patient spoke about the ongoing impact of her daughter health condition having Cancer. The patient spoke about preparation for Christmas and family gathering.The patient spoke about doing better about delegate better and utilizing her family hold .The OPT therapist utilized Motivational Interviewing to assist in creating therapeutic repore. The patient in the session was engaged and work in collaboration giving feedback about her triggers and symptoms over the past few weeks through mid December. The OPT therapist worked with the patient on setting a goals of being more active and changing environment when possible through the Winter season. The patient spoke about her ongoing adjustment with not overdoing things in the home and delegating better.The patient spoke about her reflections for 2025 as part of the preparing for transitional change in moving out of 2025 and into  2026 with hopes of having a better pace and more acceptance around if something doesn't get done immediately that its ok and not to catastrophsize. The patient work with the OPT therapist utilized Engineer, Manufacturing Systems Therapy through cognitive restructuring.The OPT therapist worked with the patient utilizing the in my control vs not in my control exercise and work to identify automatic negative thoughts. The OPT therapist worked with the patient  on coping skills for both indoor and outdoor. The patient spoke about her work to eat healthier lately to see if this helps her feel better physically. The OPT therapist worked with the patient overviewing her med therapy needs with upcoming schedule appointment with Shavon Rankin 10/18/2024.The OPT therapist overviewed with the patient upcoming appointments listed in the patient MyChart.     Suicidal/Homicidal: Nowithout intent/plan   Therapist Response: The OPT therapist worked with the patient for the patients scheduled session. The patient was engaged in her session and gave feedback in relation to triggers, symptoms, and behavior responses over the past few weeks. The patient spoke about changes with assigned caregiver tasks. The patient notes adjustment to her sister moving in which has been a help for the patient due to her sister being able to assist with task in and out of the home..The OPT therapist continued to encourage the patient once she is feeling better to get out of the home more into the community. The OPT therapist worked with the patient in review of basic need areas with incorporation of adding a goal of starting some form of physical exercise ; this starting with low increments and low impact and adjusting based on patient body response with emphasis on the exercise being within her physical  health limits and safe.The OPT therapist worked with the patient utilizing an in session Cognitive Behavioral Therapy exercise and focused with the patient on  her interactions with other family members . The OPT therapist reviewed with the patient supports and coping strategies including change of environment and getting out of the home more over the next few days. The OPT therapist worked with the patient on identifying changes she can make that are within her control and trying to delegate with her support system. 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 The patient spoke about her awareness of the week being more busy due to the Christmas holiday and working to pace herself throughout the week as the family is hosting for Christmas this year.The patient continues to work with her physical health providers. The OPT therapist worked with the patient on managing in home stressors based on the patients current at home dynamics.   Plan: Return again in 3/4 weeks.   Diagnosis:      Axis I: Recurrent Moderate MDD with Anxiety                            Axis II: No diagnosis   Collaboration of Care: Collaboration of care in review of patient health care and ongoing treatment work with her health providers and psychiatrist Shavon Rankin   Patient/Guardian was advised Release of Information must be obtained prior to any record release in order to collaborate their care with an outside provider. Patient/Guardian was advised if they have not already done so to contact the registration department to sign all necessary forms in order for us  to release information regarding their care.    Consent: Patient/Guardian gives verbal consent for treatment and assignment of benefits for services provided during this visit. Patient/Guardian  expressed understanding and agreed to proceed.     I discussed the assessment and treatment plan with the patient. The patient was provided an opportunity to ask questions and all were answered. The patient agreed with the plan and demonstrated an understanding of the instructions.   The patient was advised to call back or seek an in-person evaluation if the symptoms worsen or if the condition fails to improve as anticipated.   I provided 45 minutes of non-face-to-face time during this encounter.   Jerel ONEIDA Pepper, LCSW   09/20/2024

## 2024-09-27 ENCOUNTER — Telehealth: Payer: Self-pay

## 2024-09-27 NOTE — Telephone Encounter (Signed)
 PA for Qulipta  sent to [facility] (faxed).

## 2024-10-11 ENCOUNTER — Other Ambulatory Visit: Payer: Self-pay | Admitting: Physician Assistant

## 2024-10-18 ENCOUNTER — Encounter (HOSPITAL_COMMUNITY): Payer: Self-pay | Admitting: Registered Nurse

## 2024-10-18 ENCOUNTER — Ambulatory Visit (HOSPITAL_COMMUNITY): Admitting: Registered Nurse

## 2024-10-18 ENCOUNTER — Other Ambulatory Visit: Payer: Self-pay | Admitting: Physician Assistant

## 2024-10-18 DIAGNOSIS — F411 Generalized anxiety disorder: Secondary | ICD-10-CM

## 2024-10-18 DIAGNOSIS — R632 Polyphagia: Secondary | ICD-10-CM | POA: Diagnosis not present

## 2024-10-18 DIAGNOSIS — F332 Major depressive disorder, recurrent severe without psychotic features: Secondary | ICD-10-CM | POA: Diagnosis not present

## 2024-10-18 MED ORDER — BUPROPION HCL ER (XL) 300 MG PO TB24: 300.0000 mg | ORAL_TABLET | Freq: Every day | ORAL | 1 refills | Status: AC

## 2024-10-18 MED ORDER — LISDEXAMFETAMINE DIMESYLATE 10 MG PO CAPS: 10.0000 mg | ORAL_CAPSULE | Freq: Every day | ORAL | 0 refills | Status: AC

## 2024-10-18 MED ORDER — BUSPIRONE HCL 5 MG PO TABS: 5.0000 mg | ORAL_TABLET | Freq: Three times a day (TID) | ORAL | 1 refills | Status: AC

## 2024-10-18 NOTE — Patient Instructions (Addendum)
 If no one has contacted, you by the end of business day today please call the appropriate office listed below to schedule your next visit for medication management with Luisa Ruder, NP:    [facility] at [facility] [address] [place], [place], [place], [place] [address]  Phone: [phone] (Call to schedule appointment)  [facility] at [facility] [address] [place], [place] [address] Phone: [phone] (Call to schedule appointment)   You have been started on more than one new medication. Please do not take them at the same time initially. If an adverse reaction occurs, this will help identify which medication is responsible.  Call 911, [phone], mobile crisis, or present to the nearest emergency room should you experience any suicidal/homicidal ideation, auditory/visual/hallucinations, or detrimental worsening of your mental health.  Mobile Crisis Response Teams Listed by counties in vicinity of [facility] providers [facility] [phone] [facility] [phone] [facility] [phone] [facility] [phone] [place]                * [facility] [phone]                * [facility] [phone]  [facility] [phone] [facility] [employer]  [phone] * [employer] [phone]

## 2024-10-18 NOTE — Telephone Encounter (Signed)
Office visit needed for further refills

## 2024-10-18 NOTE — Progress Notes (Signed)
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  (WELLBUTRIN  XL) 300 MG 24 hr tablet; Take 1 tablet (300 mg total) by mouth daily.  Dispense: 30 tablet; Refill: 1  2. GAD (generalized anxiety disorder) - busPIRone  (BUSPAR ) 5 MG tablet; Take 1 tablet (5 mg total) by mouth 3 (three) times daily.  Dispense: 90 tablet; Refill: 1  3. Binge eating - lisdexamfetamine  (VYVANSE ) 10 MG capsule; Take 1 capsule (10 mg total) by mouth daily.  Dispense: 30 capsule; Refill: 0 - lisdexamfetamine  (VYVANSE ) 10 MG capsule; Take 1 capsule (10 mg total) by mouth daily.  Dispense: 30 capsule; Refill: 0   Plan: Medication management: Meds ordered this encounter   Medications   buPROPion  (WELLBUTRIN  XL) 300 MG 24 hr tablet    Sig: Take 1 tablet (300 mg total) by mouth daily.    Dispense:  30 tablet    Refill:  1    Supervising Provider:   ARFEEN, SYED T [[id number]]   busPIRone  (BUSPAR ) 5 MG tablet    Sig: Take 1 tablet (5 mg total) by mouth 3 (three) times daily.    Dispense:  90 tablet    Refill:  1    Supervising Provider:   ARFEEN, SYED T [[id number]]   lisdexamfetamine  (VYVANSE ) 10 MG capsule    Sig: Take 1 capsule (10 mg total) by mouth daily.    Dispense:  30 capsule    Refill:  0    Supervising Provider:   ARFEEN, SYED T [[id number]]   lisdexamfetamine  (VYVANSE ) 10 MG capsule    Sig: Take 1 capsule (10 mg total) by mouth daily.    Dispense:  30 capsule    Refill:  0    Supervising Provider:   ARFEEN, SYED T [[id number]]   Medications Discontinued During This Encounter  Medication Reason   buPROPion  (WELLBUTRIN  XL) 150 MG 24 hr tablet Reorder    Labs:  Most recent labs reviewed.  Not indicated at this time.     Other:  Counseling/Therapy:  Referral made awaiting appointment date and time.    Referred to PCP for EKG Dana Bradley was instructed to call [phone], mobile crisis, or present to the nearest emergency room should she experiences any suicidal/homicidal ideation, auditory/visual/hallucinations, or detrimental worsening of her mental health condition.    Dana Bradley participated in the development of this treatment plan and verbalized her understanding/agreement with plan as listed.   Follow Up: Return in 1 month for medication management Call in the interim for any side-effects, decompensation, questions, or problems  Collaboration of Care: Medication Management AEB medication assessment, adjustment, refills, started BuSpar  and Vyvanse , Primary Care Provider AEB referral to PCP for EKG, and Referral or follow-up with  counselor/therapist AEB referral to counseling/therapy  Patient/Guardian was advised Release of Information must be obtained prior  to any record release in order to collaborate their care with an outside provider. Patient/Guardian was advised if they have not already done so to contact the registration department to sign all necessary forms in order for us  to release information regarding their care.   Consent: Patient/Guardian gives verbal consent for treatment and assignment of benefits for services provided during this visit. Patient/Guardian expressed understanding and agreed to proceed.   Dana Wiegel, NP 1/19/20267:07 PM     [1]  Allergies Allergen Reactions   Aspirin Swelling   Cephalexin Rash   Cymbalta [Duloxetine Hcl] Other (See Comments)    Suicidal ideations and has homicidal thoughts per patient   Depakote [Divalproex Sodium] Shortness Of Breath    W/ n/v   Gadolinium Derivatives     Pt was unable to breath Other reaction(s): Other (See Comments) Pt was unable to breath   Haloperidol Shortness Of Breath    W/ n/v   Meperidine Nausea And Vomiting    Patient projectile vomits and usually result in ER Other reaction(s): Vomiting   Metoclopramide Shortness Of Breath    Can't breath, wheezes  Other reaction(s): Other (See Comments)  Can't breath, wheezes Other reaction(s): Other (See Comments)  Can't breath, wheezes Other reaction(s): Other (See Comments)    Can't breath, wheezes  Other reaction(s): Other (See Comments)  Can't breath, wheezes Other reaction(s): Other (See Comments)   Morphine     Other reaction(s): Vomiting   Penicillins Rash    Other reaction(s): Unknown   Prochlorperazine Other (See Comments)    Panic attack Other reaction(s): Other (See Comments)   Tramadol  Hcl Palpitations    Severely and adversely affects her SVT giving her tachycardias of 150-160 bpm.   Trazodone Shortness Of Breath    Other reaction(s): Other (See Comments)   Meloxicam Other (See Comments)    mouth sores, tingling, blisters in mouth Other reaction(s): Other (See Comments) mout sores,  tingling mouth sores, tingling, blisters in mouth   Neomycin-Bacitracin Zn-Polymyx Rash   Tomato Hives    Tongue will blister   Egg Protein-Containing Drug Products Other (See Comments)    inflammation   Milk-Related Compounds Other (See Comments)    inflammation   Other     Other reaction(s): Other (See Comments)   Shellfish Allergy     Other reaction(s): Unknown   Shellfish Protein-Containing Drug Products Other (See Comments)    Other reaction(s): Unknown Other reaction(s): Unknown   Bacitra-Neomycin-Polymyxin-Hc Rash   Bacitracin-Neomycin-Polymyxin Rash   Cephalosporins Rash    rash   Ibuprofen Other (See Comments) and Rash    Blisters in mouth. Blisters in mouth.   Latex Itching    Other reaction(s): Unknown   Nsaids Other (See Comments)    Blisters in mouth; can take 1 ibuprofen 2x a month Other reaction(s): Other (See Comments), Unknown Blisters in mouth; can take 1 ibuprofen 2x a month   Sulfa Antibiotics Rash    rash Other reaction(s): Unknown rash  Other reaction(s): Unknown rash rash Other reaction(s): Unknown rash   Sulfonamide Derivatives Rash   "

## 2024-10-19 ENCOUNTER — Ambulatory Visit (HOSPITAL_COMMUNITY): Admitting: Clinical

## 2024-10-19 DIAGNOSIS — F419 Anxiety disorder, unspecified: Secondary | ICD-10-CM | POA: Diagnosis not present

## 2024-10-19 DIAGNOSIS — F331 Major depressive disorder, recurrent, moderate: Secondary | ICD-10-CM | POA: Diagnosis not present

## 2024-10-19 DIAGNOSIS — F411 Generalized anxiety disorder: Secondary | ICD-10-CM

## 2024-10-19 NOTE — Progress Notes (Signed)
 Virtual Visit via Video Note   I connected with Dana Bradley on 10/19/24 at 11:00 AM EST by a video enabled telemedicine application and verified that I am speaking with the correct person using two identifiers.   Location: Patient: home Provider: office   I discussed the limitations of evaluation and management by telemedicine and the availability of in person appointments. The patient expressed understanding and agreed to proceed.       THERAPIST PROGRESS NOTE   Session Time: 11:00 AM-11:30 AM   Participation Level: Active   Behavioral Response: CasualAlertDepressed   Type of Therapy: Individual Therapy   Treatment Goals addressed: Anger Mood Management and Coping   Interventions: CBT, Motivational Interviewing, Solution Focused and Strength-based   Summary: Dana Bradley is a 54 y.o. female who presents with Recurrent Moderate MDD with Anxiety. The OPT therapist worked with the patient for her scheduled OPT treatment.The patient reported calmer last few weeks with no additional reports of falls, injuries, or ER visits.The patient spoke about the ongoing impact of her daughter health condition having Cancer. The patient spoke about experiences over  Christmas and New Years and family gathering.The patient spoke about continuing to work on doing delegate better and utilizing her family hold .The OPT therapist utilized Motivational Interviewing to assist in creating therapeutic repore. The patient in the session was engaged and work in collaboration giving feedback about her triggers and symptoms over the past few weeks through mid January. The OPT therapist worked with the patient on setting a goals of being more active and changing environment when possible through the Winter season. The patient spoke about her ongoing adjustment with not overdoing things in the home and delegating better.The patient spoke about her ongoing work around acceptance around if something doesn't get done immediately  that its ok and not to catastrophsize. The patient work with the OPT therapist utilized Engineer[employer] Therapy through cognitive restructuring.The OPT therapist worked with the patient utilizing the in my control vs not in my control exercise and work to identify automatic negative thoughts. The OPT therapist worked with the patient  on coping skills for both indoor and outdoor. The patient spoke about her work to eat healthier lately to see if this helps her feel better physically. The OPT therapist worked with the patient overviewing her med therapy post recent med therapy with Shavon Rankin 10/18/2024 and the patient has a increase of Wellbutrin  and the psychiatrist added Buspar  and Vyvanse . The patient will be doing a trail run with the changes in her med therapy over the end of January and through February .The OPT therapist overviewed with the patient upcoming appointments listed in the patient MyChart.     Suicidal/Homicidal: Nowithout intent/plan   Therapist Response: The OPT therapist worked with the patient for the patients scheduled session. The patient was engaged in her session and gave feedback in relation to triggers, symptoms, and behavior responses over the past few weeks through the end of the 2025 year through the holidays.The OPT therapist continued to encourage the patient once she is feeling better to get out of the home more into the community. The OPT therapist worked with the patient in review of basic need areas with incorporation of adding a goal of starting some form of physical exercise ; this starting with low increments and low impact and adjusting based on patient body response with emphasis on the exercise being within her physical health limits and safe.The OPT therapist worked with the patient utilizing an in  session Cognitive Behavioral Therapy exercise and focused with the patient on her interactions with other family members . The OPT therapist reviewed with the  patient supports and coping strategies including knitting and preparing for [employer]. The OPT therapist worked with the patient on identifying changes she can make that are within her control and trying to delegate with her support system. 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 The patient spoke about upcoming anniversary of her Mothers passing and spoke about her plan to stay busy and be involved with other family member during the 1st year anniversary. The OPT therapist worked with the patient on managing in home stressors based on the patients current at home dynamics.   Plan: Return again in 3/4 weeks.   Diagnosis:      Axis I: Recurrent Moderate MDD with Anxiety                            Axis II: No diagnosis   Collaboration of Care: Collaboration of care in review of patient health care and ongoing treatment work with her health providers and psychiatrist Shavon Rankin   Patient/Guardian was advised Release of Information must be obtained prior to any record release in order to collaborate their care with an outside provider. Patient/Guardian was advised if they have not already done so to contact the registration department to sign all necessary forms in order for us  to release information regarding their care.    Consent: Patient/Guardian gives verbal consent for treatment and assignment of benefits for services provided during this visit. Patient/Guardian expressed understanding and agreed to proceed.     I discussed the assessment and treatment plan with the patient. The patient was provided an opportunity to ask questions and all were  answered. The patient agreed with the plan and demonstrated an understanding of the instructions.   The patient was advised to call back or seek an in-person evaluation if the symptoms worsen or if the condition fails to improve as anticipated.   I provided 30 minutes of non-face-to-face time during this encounter.   Jerel ONEIDA Pepper, LCSW   10/19/2024

## 2024-10-22 ENCOUNTER — Other Ambulatory Visit (HOSPITAL_COMMUNITY): Payer: Self-pay

## 2024-10-25 ENCOUNTER — Other Ambulatory Visit (HOSPITAL_COMMUNITY): Payer: Self-pay

## 2024-10-27 ENCOUNTER — Other Ambulatory Visit: Payer: Self-pay

## 2024-10-28 ENCOUNTER — Other Ambulatory Visit: Payer: Self-pay

## 2024-10-29 ENCOUNTER — Encounter: Payer: Self-pay | Admitting: Physical Medicine and Rehabilitation

## 2024-10-29 ENCOUNTER — Encounter: Attending: Physical Medicine and Rehabilitation | Admitting: Physical Medicine and Rehabilitation

## 2024-10-29 ENCOUNTER — Other Ambulatory Visit: Payer: Self-pay

## 2024-10-29 VITALS — BP 122/78 | HR 105 | Ht 63.0 in | Wt 228.0 lb

## 2024-10-29 DIAGNOSIS — H8110 Benign paroxysmal vertigo, unspecified ear: Secondary | ICD-10-CM | POA: Insufficient documentation

## 2024-10-29 DIAGNOSIS — K5900 Constipation, unspecified: Secondary | ICD-10-CM | POA: Insufficient documentation

## 2024-10-29 DIAGNOSIS — E119 Type 2 diabetes mellitus without complications: Secondary | ICD-10-CM | POA: Insufficient documentation

## 2024-10-29 DIAGNOSIS — E66813 Obesity, class 3: Secondary | ICD-10-CM | POA: Diagnosis present

## 2024-10-29 DIAGNOSIS — G43711 Chronic migraine without aura, intractable, with status migrainosus: Secondary | ICD-10-CM | POA: Diagnosis present

## 2024-10-29 DIAGNOSIS — Z794 Long term (current) use of insulin: Secondary | ICD-10-CM | POA: Diagnosis not present

## 2024-10-29 NOTE — Progress Notes (Addendum)
 "  Subjective:    Patient ID: Dana Bradley, female    DOB: [birth year], 54 y.o.   MRN: [id number]  HPI   1) Chronic Migraine:  -had a migraine from 1/25 to 1/4, which is one of her longest ones, the Qullipta and Vyepti  were not helping -she is a hard stick, this last time Vyepti  did not help much -since January 4th she has had a few migraines but has had relief in between -migraines are now starting on the side of the head -she takes Qullipta every day and is not sure if it ishelping -she was having a migraine every day while her daughter was receiving radiation -she is due for her Vyepti  soon and this lasts for some times until it wears off -she took F[facility]icet [facility] and these typically happen -light sensitivity is severe -gone when she wakes in the morning -not eating a lot of gluten, bread -she needs an appointment at the infusion center -has only had 3 non-migraines days since Thanksgiving -stopped progesterone in March, she feels that this was helping in combination with the different medications -[name] us  a 54 year old woman who presents for f/u of her chronic migraine  -discussed her food allergy test results -took the Nurtec and the Fioricet last night and her migraine got better -she thinks she been dehydrated -the Nolberto has helped -she thinks the Vyepti  has helped  -migraines have been decent over the last few weeks -she thinks Vyepti  helps -she is trying not to be stressed -[name] went to the cardiologist today and they are doing a cardiac MRI, and her fatigue is worse, she was told her heart is not pumping well. --she would like to try Zavzpret  -she has ordered [employer]'s magnesium -she has been having stress due to her mother's Alzheimer's disease -she has been very fatigued during the day -she cannot be in light -on Monday she had to wear an eye mask in a darkened room. -Migraines will not get better unless she takes the Fioricet with the maxaalt  but it puts her to sleepy -she feels she is getting a migraine every day -the Fioricet and Nurtec or Maxaalt together give her relief, but not complete relief.  -the days run into each other and its hard to remember the differences between them -she has been stressed in caring for her mother -she has noted recently that when she lays flat she feels fine but as soon as she sits up she feels an excruciating migraine -migraine continues to worsen from sitting to standing position -her daughter has similar symptoms but felt better after starting acetazolamide for presumed diagnosis of intracranial hypertension. She asks whether she may benefit from this medication as well.  Terrible stomach spasms and diarrhea. 12 hours later debilitating migraine. It usually starts in the afternoon. She feels that this may be part of her prodrome. She also has terrible vertigo that is getting worse. The Fiorcet helps a lot, the Migronal helps with the frontal headache. These make the pain bearable.  -having migraines close to every 24H -she is not functional -she is not sure if Maxaalt is helping. She is not taking with the Nurtec. It is helping some but she is not sure how much. The fioricet alone helps by putting her to sleep.  -she has not heart any news about the Vypeti infusion. The medication was approved  -has been having more dizziness.  -the pain has been worst since she had the occipital nerve  blocks.  -she knows it is stress related -she feels a pain radiating down her arm as well. It also runs into her head and into her face.  -Sometimes her whole left side is fizzy.  -she has tried Amitriptyline .  -have been awful since December. Had a migraine from Dec 1st throughout 9th.  -the day before she was incapacitated.  -Vyepti  has been approved -The nurtec and the Fiorocet help together to break the migraine.  -she needs a refill of her Fioricet and nausea, she finds this helpful.  -Nurtec- she is still  working with the company to get this improved -she asks about prodrome and postdrome (she experiences cramping and diarrhea).  -on Sunday she closed out a 56 hour migraines. -she has been trying to follow ketogenic diet.  -Nurtec helping- 5 days in a row without a migraines.  -she feels if she could take it as a rescue medication in addition to every other day this may help.   2) Vertigo -this is worst in the dark -she has not done vestibular rehab because no one called her -Symptoms are worsening.  -She feels that this may be leading up to the vertigo.  -She is interested in vestibular therapy.  -she had a heating pad on the shoulder and arm.  -tried eliminating gluten but did not see improvement  3) Cervical myofasical pain syndrome: -Muscles of neck are very tight, pain is worse on the left side.  -She did not find spinal injections helpful, they made the pain worse.   4) Insomnia: she sleeps in chunks -she is willing to increase amitriptyline  to 50mg .   5) Cognitive impairments: -she loves to cook and noticed she has been making more mistakes -she thinks the headaches are a problem.  6) Pain radiating from back into legs -feels twitches -worse at night -legs kick out at night.   7) IBS -terrible in the night -2am on Sunday. -horrible pain -often with diarrhea and improves symptoms after she has diarrhea.  -cramping.  -last normal BM is 1/9 -having abdominal distension -magnesium citrate does not help -miralax multiple times per day do not help  8) Menstrual bleeding: -has been present since 12/11 -she was started on progesterone a few days before that date  9) depression: -she has dry mouth will wellbutrin  and did not find much efficacy. She asked if there is an alternative medication she can try -she had tried Zoloft  in the past with benefits -she is going to pick up the Zoloft  today and try it tonight -she would be willing to take vitamin D   10) Low back  pain: -radiates into her feet -not a good surgical candidate -she feels she will be wheelchair bound soon -she is going to be receiving her spinal cord stimulator placed tomorrow  11. Stress: -she is stressed that [name] as a pleural lesion  Mrs. Brossard is a 54 year old woman who presents for follow-up of her chronic migraine. Her average pain is 6/10 and pain right now is 6/10. Her pain is intermittent, sharp, burning, stabbing, tingling, and aching.   She recently had 7 days without migraine. She is not sure what was associated with this. She had a pretty terrible bounceback.  She picked up the Migranal  nasal spray. Our CMA explained how it works.   Her pain doctor is going to stop prescribing the Lyrica  and she needs to find a new doctor to prescribe this.   12) Impaired balance: -she was trying to trim trees  the other day and had to keep catching herself with the pruners -she is also started to having sharp pains on the right side as well as the left side  13) obesity:  -started Ozempic  two months ago   Pain Inventory Average Pain 6 Pain right now 4 My pain is intermittent, constant, sharp, burning, stabbing, and tingling  In the last 24 hours, has pain interfered with the following? General activity 4 Relation with others 4 Enjoyment of life 6 What TIME of day is your pain at its worst? evening Sleep (in general) Poor  Pain is worse with: bending Pain improves with: rest, heat/ice, pacing activities, and medication Relief from Meds: 5    Family History  Problem Relation Age of Onset   Depression Mother    Hypertension Mother    Cancer Mother        Skin   Hyperlipidemia Mother    Anxiety disorder Mother    Migraines Mother    Alcohol abuse Father    Depression Father    Stroke Father    Heart disease Father    Hypertension Father    Anxiety disorder Father    Depression Sister    Hyperlipidemia Sister    Diabetes Sister    Hypertension Sister     Polycystic ovary syndrome Sister    Bipolar disorder Sister    Anxiety disorder Sister    Migraines Sister    Depression Sister    Hypertension Sister    Anxiety disorder Sister    Migraines Sister    Breast cancer Maternal Grandmother 60   Cancer Maternal Grandmother 108       Breast   Thyroid  disease Maternal Grandmother    Arthritis Maternal Grandmother    Hyperlipidemia Maternal Grandmother    Aneurysm Maternal Grandfather    Hypertension Maternal Grandfather    Heart disease Maternal Grandfather    Alzheimer's disease Paternal Grandmother    Heart attack Paternal Grandfather    Hypertension Paternal Grandfather    COPD Paternal Grandfather    Heart disease Paternal Grandfather    Migraines Daughter    Other Daughter        leomyoa scaroma   Migraines Daughter    Migraines Son    Alzheimer's disease Other    Bladder Cancer Neg Hx    Kidney cancer Neg Hx    Social History   Socioeconomic History   Marital status: Married    Spouse name: brian   Number of children: 3   Years of education: Not on file   Highest education level: Associate degree: academic program  Occupational History   Occupation: disbled    Comment: not able  Tobacco Use   Smoking status: Former    Current packs/day: 0.00    Average packs/day: 2.0 packs/day for 3.0 years (6.0 ttl pk-yrs)    Types: Cigarettes    Start date: 12/10/1989    Quit date: 12/10/1992    Years since quitting: 31.9   Smokeless tobacco: Never   Tobacco comments:    quit 25 years ago  Vaping Use   Vaping status: Never Used  Substance and Sexual Activity   Alcohol use: No    Comment: socially   Drug use: No   Sexual activity: Not Currently    Partners: Male    Birth control/protection: Surgical  Other Topics Concern   Not on file  Social History Narrative   Lives at home with her husband and 2 of her children  Right handed   Caffeine : 0-2 cups daily   Social Drivers of Health   Tobacco Use: Medium Risk  (10/18/2024)   Patient History    Smoking Tobacco Use: Former    Smokeless Tobacco Use: Never    Passive Exposure: Not on file  Financial Resource Strain: Low Risk (06/15/2024)   Overall Financial Resource Strain (CARDIA)    Difficulty of Paying Living Expenses: Not very hard  Food Insecurity: No Food Insecurity (06/15/2024)   Epic    Worried About Programme Researcher, Broadcasting/film/video in the Last Year: Never true    Ran Out of Food in the Last Year: Never true  Transportation Needs: No Transportation Needs (06/15/2024)   Epic    Lack of Transportation (Medical): No    Lack of Transportation (Non-Medical): No  Physical Activity: Inactive (06/15/2024)   Exercise Vital Sign    Days of Exercise per Week: 0 days    Minutes of Exercise per Session: 0 min  Stress: No Stress Concern Present (07/30/2024)   Received from [facility] - Occupational Stress Questionnaire    Do you feel stress - tense, restless, nervous, or anxious, or unable to sleep at night because your mind is troubled all the time - these days?: Only a little  Social Connections: Moderately Integrated (06/15/2024)   Social Connection and Isolation Panel    Frequency of Communication with Friends and Family: More than three times a week    Frequency of Social Gatherings with Friends and Family: Never    Attends Religious Services: More than 4 times per year    Active Member of Clubs or Organizations: No    Attends Banker Meetings: Never    Marital Status: Married  Depression (PHQ2-9): High Risk (10/18/2024)   Depression (PHQ2-9)    PHQ-2 Score: 18  Alcohol Screen: Low Risk (10/18/2024)   Alcohol Screen    Last Alcohol Screening Score (AUDIT): 1  Housing: Low Risk (06/15/2024)   Epic    Unable to Pay for Housing in the Last Year: No    Number of Times Moved in the Last Year: 0    Homeless in the Last Year: No  Utilities: Not At Risk (06/15/2024)   Epic    Threatened with loss of  utilities: No  Health Literacy: Adequate Health Literacy (06/15/2024)   B1300 Health Literacy    Frequency of need for help with medical instructions: Never   Past Surgical History:  Procedure Laterality Date   ABLATION     Uterine   BREAST BIOPSY Left    2014 U/S bx fibroadenoma   BREAST BIOPSY Left 05/28/2023   lr us  bx 3:00 heart clip   BREAST BIOPSY Left 05/28/2023   US  LT BREAST BX W LOC DEV 1ST LESION IMG BX SPEC US  GUIDE 05/28/2023 [facility]   CARDIAC CATHETERIZATION     with ablation   COLONOSCOPY N/A 09/26/2022   Procedure: COLONOSCOPY;  Surgeon: Therisa Bi, MD;  Location: [facility] ENDOSCOPY;  Service: Gastroenterology;  Laterality: N/A;   COLONOSCOPY WITH PROPOFOL  N/A 05/17/2015   Procedure: COLONOSCOPY WITH PROPOFOL ;  Surgeon: Lamar ONEIDA Holmes, MD;  Location: [facility] ENDOSCOPY;  Service: Endoscopy;  Laterality: N/A;   COLONOSCOPY WITH PROPOFOL  N/A 03/20/2020   Procedure: COLONOSCOPY WITH PROPOFOL ;  Surgeon: Therisa Bi, MD;  Location: [facility] ENDOSCOPY;  Service: Gastroenterology;  Laterality: N/A;   COLONOSCOPY WITH PROPOFOL  N/A 09/25/2022   Procedure: COLONOSCOPY WITH PROPOFOL ;  Surgeon: Therisa Bi, MD;  Location:  [facility] ENDOSCOPY;  Service: Gastroenterology;  Laterality: N/A;   COLONOSCOPY WITH PROPOFOL  N/A 10/02/2022   Procedure: COLONOSCOPY WITH PROPOFOL ;  Surgeon: Therisa Bi, MD;  Location: [facility] ENDOSCOPY;  Service: Gastroenterology;  Laterality: N/A;   COLONOSCOPY WITH PROPOFOL  N/A 11/13/2022   Procedure: COLONOSCOPY WITH PROPOFOL ;  Surgeon: Therisa Bi, MD;  Location: [facility] ENDOSCOPY;  Service: Gastroenterology;  Laterality: N/A;   ESOPHAGOGASTRODUODENOSCOPY N/A 05/17/2015   Procedure: ESOPHAGOGASTRODUODENOSCOPY (EGD);  Surgeon: Lamar ONEIDA Holmes, MD;  Location: [facility] ENDOSCOPY;  Service: Endoscopy;  Laterality: N/A;   ESOPHAGOGASTRODUODENOSCOPY N/A 09/25/2022   Procedure: ESOPHAGOGASTRODUODENOSCOPY (EGD);  Surgeon: Therisa Bi, MD;  Location: [facility] ENDOSCOPY;  Service:  Gastroenterology;  Laterality: N/A;   ESOPHAGOGASTRODUODENOSCOPY N/A 09/26/2022   Procedure: ESOPHAGOGASTRODUODENOSCOPY (EGD);  Surgeon: Therisa Bi, MD;  Location: [facility] ENDOSCOPY;  Service: Gastroenterology;  Laterality: N/A;   KNEE ARTHROSCOPY     spg     6/18   Tibial Tubercle Bypass Right 1998   TUBAL LIGATION  10/01/1999   Past Surgical History:  Procedure Laterality Date   ABLATION     Uterine   BREAST BIOPSY Left    2014 U/S bx fibroadenoma   BREAST BIOPSY Left 05/28/2023   lr us  bx 3:00 heart clip   BREAST BIOPSY Left 05/28/2023   US  LT BREAST BX W LOC DEV 1ST LESION IMG BX SPEC US  GUIDE 05/28/2023 [facility]   CARDIAC CATHETERIZATION     with ablation   COLONOSCOPY N/A 09/26/2022   Procedure: COLONOSCOPY;  Surgeon: Therisa Bi, MD;  Location: [facility] ENDOSCOPY;  Service: Gastroenterology;  Laterality: N/A;   COLONOSCOPY WITH PROPOFOL  N/A 05/17/2015   Procedure: COLONOSCOPY WITH PROPOFOL ;  Surgeon: Lamar ONEIDA Holmes, MD;  Location: [facility] ENDOSCOPY;  Service: Endoscopy;  Laterality: N/A;   COLONOSCOPY WITH PROPOFOL  N/A 03/20/2020   Procedure: COLONOSCOPY WITH PROPOFOL ;  Surgeon: Therisa Bi, MD;  Location: [facility] ENDOSCOPY;  Service: Gastroenterology;  Laterality: N/A;   COLONOSCOPY WITH PROPOFOL  N/A 09/25/2022   Procedure: COLONOSCOPY WITH PROPOFOL ;  Surgeon: Therisa Bi, MD;  Location: [facility] ENDOSCOPY;  Service: Gastroenterology;  Laterality: N/A;   COLONOSCOPY WITH PROPOFOL  N/A 10/02/2022   Procedure: COLONOSCOPY WITH PROPOFOL ;  Surgeon: Therisa Bi, MD;  Location: [facility] ENDOSCOPY;  Service: Gastroenterology;  Laterality: N/A;   COLONOSCOPY WITH PROPOFOL  N/A 11/13/2022   Procedure: COLONOSCOPY WITH PROPOFOL ;  Surgeon: Therisa Bi, MD;  Location: [facility] ENDOSCOPY;  Service: Gastroenterology;  Laterality: N/A;   ESOPHAGOGASTRODUODENOSCOPY N/A 05/17/2015   Procedure: ESOPHAGOGASTRODUODENOSCOPY (EGD);  Surgeon: Lamar ONEIDA Holmes, MD;  Location[facility] ENDOSCOPY;  Service: Endoscopy;   Laterality: N/A;   ESOPHAGOGASTRODUODENOSCOPY N/A 09/25/2022   Procedure: ESOPHAGOGASTRODUODENOSCOPY (EGD);  Surgeon: Therisa Bi, MD;  Location: [facility] ENDOSCOPY;  Service: Gastroenterology;  Laterality: N/A;   ESOPHAGOGASTRODUODENOSCOPY N/A 09/26/2022   Procedure: ESOPHAGOGASTRODUODENOSCOPY (EGD);  Surgeon: Therisa Bi, MD;  Location: [facility] ENDOSCOPY;  Service: Gastroenterology;  Laterality: N/A;   KNEE ARTHROSCOPY     spg     6/18   Tibial Tubercle Bypass Right 1998   TUBAL LIGATION  10/01/1999   Past Medical History:  Diagnosis Date   Acute postoperative pain 04/07/2017   Anxiety    Bursitis    Chronic fatigue 12/12/2017   Chronic fatigue syndrome    Colitis 2021   Diabetes mellitus without complication (HCC)    Edema leg 05/02/2015   Fibromyalgia    GERD (gastroesophageal reflux disease)    IBS (irritable bowel syndrome)    Intractable migraine with aura without status migrainosus    Knee pain, bilateral 12/21/2008  Qualifier: Diagnosis of  By: Gladis FNP, Nykedtra     Lumbar discitis    Migraines    Osteoarthritis    Right hand pain 04/10/2015   [facility] has done nerve conduction studies and ruled out carpal tunnel.    Sleep apnea    Spinal stenosis    SVT (supraventricular tachycardia)    Vertigo    Vitamin D  deficiency 05/01/2016   There were no vitals taken for this visit.  Opioid Risk Score:   Fall Risk Score:  `1  Depression screen [facility] 2/9     10/18/2024    3:23 PM 07/29/2024   10:10 AM 06/15/2024   11:07 AM 05/24/2024   10:52 AM 04/12/2024   10:53 AM 11/10/2023    4:06 PM 10/09/2023    2:16 PM  Depression screen PHQ 2/9  Decreased Interest  1 3  1 3  0  Down, Depressed, Hopeless  1 3  1 3  0  PHQ - 2 Score  2 6  2 6  0  Altered sleeping   3   3   Tired, decreased energy   3   3   Change in appetite   2   3   Feeling bad or failure about yourself    3   3   Trouble concentrating   0   3   Moving slowly or fidgety/restless   0   0   Suicidal  thoughts   0   0   PHQ-9 Score   17    21    Difficult doing work/chores            Information is confidential and restricted. Go to Review Flowsheets to unlock data.   Data saved with a previous flowsheet row definition   Review of Systems  Constitutional: Negative.   HENT: Negative.    Eyes: Negative.   Respiratory: Negative.    Cardiovascular: Negative.   Gastrointestinal: Negative.   Endocrine: Negative.   Genitourinary: Negative.   Musculoskeletal:  Positive for back pain and neck pain.       Left side shoulder pain down to upper left arm  Skin: Negative.   Allergic/Immunologic: Negative.   Neurological:  Positive for headaches.  Hematological: Negative.   Psychiatric/Behavioral: Negative.    All other systems reviewed and are negative.      Objective:   Physical Exam Gen: no distress, normal appearing HEENT: oral mucosa pink and moist, NCAT Cardio: Reg rate Chest: normal effort, normal rate of breathing Abd: soft, non-distended Ext: no edema Psych: pleasant, normal affect Skin: intact Neuro: Alert and oriented without focal deficits, stable 1/30     Assessment & Plan:  Mrs. Dwight is a 54 year old woman who presents for f/u with chronic intractable migraines s/p numerous treatments, severe fibromyalgia, and IBS, and nausea, and vertigo.     1) Vertigo: In the future, restart for cervical myofascial pain syndrome: myofascial release, postural correction, stretching and strengthening of the muscles of the neck and upper back, development of HEP. Conitnue heating pads to muscles of upper back and neck. She is doing HEP.  -referred for vestibular therapy.  -discussed current symptoms.  -discussed response to meclizine  -discussed worsening symptoms when in car -discussed that she feels impaired balance -discussed that she defers MRI of the brain -discussed that this has resolved   2) Migraines:  -d/c vyepti  -discussed that Emgality  is not helpful -continue  Qullipta -discussed that gluten elimination diet was unhelpful for her -  discussed food allergy results -recommended methylated B vitamin -refilled Lyrica  and Fioricet -discussed that restarting progesterone may be helpful -increase Qullipta to 60mg  daily -recommended avoiding eggs, dairy, gluten, eating a lot of fruits and vegetables -discussed that highest priority is to eliminate gluten Continue Fioricet, which is one of the medications that helps her. Refilled today. She takes this at least once every time she gets a migraine. Advised to use upon migraine onset and to not use more frequently than q6H during migraine. Refilled Zofran  for nausea last week, and this has been helping her. Continue metoprolol  which can be helpful in migraine prophylaxis (HR is well controlled). Prescribed ergot nasal spray to try upon migraine initiation- advised no more frequently than 4 sprays per hour (still awaiting on prior auth). Discussed avoiding foods that may trigger migraines.  Failed vypeti, ajovy, emgality , continue Vypeti trial, increase Vyepti  to 300mg  -trial of Bi[employer]em[employer]'s magnesium supplement -discussed the elimination diet -prescribed Zavzpret  -continue Qulipta  daily prn.  -continue re-entry phase of elimination diet- she has noted chili pepper and eggs to be triggers for her -failed feverfew supplement -discussed lumbar puncture Does get some benefit from Maxalt  and Fioricet in combination -takes metoprolol  -ordered 95 item allergy test -discussed increasing Fioricet or Amitriptyline .  -recommended drinking a glass of water every morning before standing up as her symptoms sound like orthostatic hypotension which could worsen migraines -recommended checking blood pressure daily in supine, sitting, and standing positions and this should help us  identify if symptoms are from orthostatic hypotension or intracranial hypertension -discussed trying daily water first before trying medication  acetazolamide.  -continue vitamin D   -Discussed that I can provide refill for her Lyrica  when she needs it.  -topamax  was not effective.  -When she gets the migraines her loss of words is getting worse.  -Ordered Vyepti - she will find out cost from her pharmacy. She will bring paperwork for this next visit. She is excited to try it.  -d/c nurtec since switched to maxalt  -continue ear piercing. Pain feels more like pressure and less acidic.  -no benefits from Botox.  -continue to track migraines.  -Continue Baclofen  for pain relief . Minimize use of Hydrocodone . She is only taking Lyrica  once per day to make it last. Continue Migranal  which is helping. Will occipital nerve block -Discussed that Mrs. Cayer's greatest source of happiness is her family. This community will be essential in helping her recover from her chronic pain and to increase her daily activity. Her daughter is also suffering from similar migraines unfortunately.  -Continue ginger, herbs, turmeric, blueberries, eating real food. Continue cutting down sugar. Using local honey is a great alternative. -Provided with a pain relief  journal and discussed that it contains foods and lifestyle tips to naturally help to improve pain. Discussed that these lifestyle strategies are also very good for health unlike some medications which can have negative side effects. Discussed that the act of keeping a journal can be therapeutic and helpful to realize patterns what helps to trigger and alleviate pain.   -Continue Nurtec every other day- may use for breakthrough migraines as well.  -Discussed plan for Vypeti at [facility] infusion center.  Discussed that exercise is one of the most effective treatments for fibromyalgia. This will also help with her obesity. Made goal with Mrs. Chohan to walk outside her home at least once per day, and to garden at least once per day (her favorite activity). Can use elliptical which she has at home on rainy days.  The heat  has been oppressive and so she has been trying to do the latter more.  Foods to alleviate migraine:  1) dark leafy greens 2) avocado 3) tuna 4) samon and mackerel 5) beans and legumes Supplements that can be helpful: feverfew, B12, and magnesium  Foods to avoid in migraine: 1) Excessive (or irregular timing) coffee 2) red wine 3) aged cheeses 4) chocolate 5) citrus fruits 6) aspartame and other artifical sweeteners 7) yeast 8) MSG (in processed foods) 9) processed and cured meats 10) nuts and certain seeds 11) chicken livers and other organ meats 12) dairy products like buttermilk, sour cream, and yogurt 13) dried fruits like dates, figs, and raisins 14) garlic 15) onions 16) potato chips 17) pickled foods like olives and sauerkraut 18) some fresh fruits like ripe banana, papaya, red plums, raspberries, kiwi, pineapple 19) tomato-based products  Recommend to keep a migraine diary: rate daily the severity of your headache (1-10) and what foods you eat that day to help determine patterns.    3) Cervical facet arthrosis: Cervical XR normal- discussed results with patient. Pain is worse on left side.   4) Anxiety and depression -Her daughter was recently diagnosed with sarcoma and this is a great source of stress and fear to Mrs. Emilio. It has been a turbulent time for her and this has understandably worsened her symptoms. Discussed benefits of gratitude journaling and she plans to try this.  -discussed her life stressor, her mom's illness -discussed her difficult wean off Pristiq  -prescribed zoloft  -stop Wellbutrin  -prescribed vitamin D  -recommended Bioptem[employer]'s magnesium breakthrough -recommended methylated multivitamin -discussed following with Dr. Corina.  -increase amitriptyline  to 150mg  HS  5) Insomnia:  -continue amitirptyline to 50mg .  -Try to go outside near sunrise -Get exercise during the day.  -Discussed good sleep hygiene: turning off all  devices an hour before bedtime.  -Chamomile tea with dinner.  -Can consider over the counter melatonin -discussed magnesium  6) Nausea:  -continue zofran , discussed side effect of constipation -start B6 -discussed scopolamine  patch, discussed to place behind ear and lave on 72 hours.    7) Cervical myofascial pain syndrome: Will try some trigger point injections with occipital nerve block next visit.  -increase amitriptyline  to 100mg  HS  8) Concern for small bowel obstruction: -stat abdominal MRI ordered given no BM since 1/9.  -recommended to go to [facility] to get this done stat -discussed that it would be beneficial to go to the ED.   9) IBS: -continue kombucha, yogurt (she makes her own)  10) Trigeminal Neuralgia: - discontinue Carbamazepine  since not helping -continue turmeric  11) Lower back pain:  -discussed that she will be having spinal cord stimulator placed tomorrow  -discussed that Journavx  is a highly selective inhibitor for Nav 1.8, which is specific for pain in the peripheral nervous system, discussed that lidocaine  in contrast affects all Nav receptors, discussed loading dose is 2 50mg  tablets and this can be taken 1 hour before food, discussed that then 50mg  can be taken with out without food q12H until pain is tolerable. Discussed that potential side effects are pruritus, muscle spasms, increase blood creatinine, rash. Discussed that we have samples available and we have copay cards available. Discussed that the 2 phase 3 clinicial trials sent to the FDA were for abdominoplasty and bunionectomy, discussed that it has been currently studied for 14 days, discussed that it can reduce efficacy of opioids and benzodiazepines, discussed that it has not be studied in severe hepatic impairment. Discussed that it is  contraindicated with fluconazole . Discussed that it can interfere with hormonal contraception up to 28 days after use. Discussed that it can decrease fertility, it has  not been studied in pregnancy, that it has been present in animal milk during lactation, not recommended for GFR <15, discussed that outpatient if the medication requires a prior auth the copay should be $30 for at least a 60 day supply. The medication may be more likely to be in stock in [facility] and [facility]. 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 Carilyn for [facility] -discussed that she has been told she is no longer a candidate for injections, or surgery of spinal cord stimulator -Discussed Qutenza as an option for neuropathic pain control. Discussed that this is a capsaicin patch, stronger than capsaicin cream. Discussed that it is currently approved for diabetic peripheral neuropathy and post-herpetic neuralgia, but that it has also shown benefit in treating other forms of neuropathy. Provided patient with link to site to learn more about the patch: https://[email or web address].clark.biz/. Discussed that the patch would be placed in office and benefits usually last 3 months. Discussed that unintended exposure to capsaicin can cause severe irritation of eyes, mucous membranes, respiratory tract, and skin, but that Qutenza is a local treatment and does not have the systemic side effects of other nerve medications. Discussed that there may be pain, itching, erythema, and decreased sensory function associated with the application of Qutenza. Side effects usually subside within 1 week. A cold pack of analgesic medications can help with these side effects. Blood pressure can also be increased due to pain associated with administration of the patch.  unchanged. Shallow broad-based disc protrusion L4-5 unchanged. -continue Norco -continue turmeric Turmeric to reduce inflammation--can be used in cooking or taken as a supplement.  Benefits of turmeric:  -Highly anti-inflammatory  -Increases antioxidants  -Improves memory,  attention, brain disease  -Lowers risk of heart disease  -May help prevent cancer  -Decreases pain  -Alleviates depression  -Delays aging and decreases risk of chronic disease  -Consume with black pepper to increase absorption    Turmeric Milk Recipe:  1 cup milk  1 tsp turmeric  1 tsp cinnamon  1 tsp grated ginger (optional)  Black pepper (boosts the anti-inflammatory properties of turmeric).  1 tsp honey   12) Stress: -discussed that her daughter is not sleeping well -discussed her mom's health condition and the stress this causes her  13) Menstrual bleeding: -discussed that she should follow-up with OB/GYN to see if progesterone can be restarted  14. Ataxia: -discussed problem with her motor control when she is walking -handicap placard prescribed as she is unable to ambulate 200 feet without stopping rest -continue elliptical  15. Hydrocephalus: -discussed that given impaired balance, urinary incontinence, and wide based gait it would be beneficial to get an MRI to check for this  16. Constipation:  -discussed that she takes 2 magneisum citrate pills daily -discussed that her last BM was last week and was minimal -discussed that this is contributing to urinary retention -discussed that she is trying to eat high fiber tubes -discussed trying Lactulose -discussed that in November it felt like she was trying to past a softball and she had to do dig stim -discussed that this has improved since she started ozempic  -discussed that if mag citrate does not work I usually recommend an MRI -recommended cucumber juice since celery juice -Provided list of following foods that  help with constipation and highlighted a few: 1) prunes- contain high amounts of fiber.  2) apples- has a form of dietary fiber called pectin that accelerates stool movement and increases beneficial gut bacteria 3) pears- in addition to fiber, also high in fructose and sorbitol which have  laxative effect 4) figs- contain an enzyme ficin which helps to speed colonic transit 5) kiwis- contain an enzyme actinidin that improves gut motility and reduces constipation 6) oranges- rich in pectin (like apples) 7) grapefruits- contain a flavanol naringenin which has a laxative effect 8) vegetables- rich in fiber and also great sources of folate, vitamin C, and K 9) artichoke- high in inulin, prebiotic great for the microbiome 10) chicory- increases stool frequency and softness (can be added to coffee) 11) rhubarb- laxative effect 12) sweet potato- high fiber 13) beans, peas, and lentils- contain both soluble and insoluble fiber 14) chia seeds- improves intestinal health and gut flora 15) flaxseeds- laxative effect 16) whole grain rye bread- high in fiber 17) oat bran- high in soluble and insoluble fiber 18) kefir- softens stools -recommended to try at least one of these foods every day.  -drink 6-8 glasses of water per day -walk regularly, especially after meals.   17) obesity:  -discussed benefits of exercise -discussed prioritizing fiber -recommended teas, discussed that she can use her home grown herbs -discussed that she takes metformin  twice per day  18) Type 2 diabetes: -discussed that CBG was 350 yesterday -discussed that she tries to eat salad and oatmeal -discussed that she tries not to eat processed foods -discussed that she does not drink milk often -discussed that she has not tried insulin -encouraged following up with her PCP      "

## 2024-10-29 NOTE — Patient Instructions (Signed)
 Lactulose   Constipation:  -Provided list of following foods that help with constipation and highlighted a few: 1) prunes- contain high amounts of fiber.  2) apples- has a form of dietary fiber called pectin that accelerates stool movement and increases beneficial gut bacteria 3) pears- in addition to fiber, also high in fructose and sorbitol which have laxative effect 4) figs- contain an enzyme ficin which helps to speed colonic transit 5) kiwis- contain an enzyme actinidin that improves gut motility and reduces constipation 6) oranges- rich in pectin (like apples) 7) grapefruits- contain a flavanol naringenin which has a laxative effect 8) vegetables- rich in fiber and also great sources of folate, vitamin C, and K 9) artichoke- high in inulin, prebiotic great for the microbiome 10) chicory- increases stool frequency and softness (can be added to coffee) 11) rhubarb- laxative effect 12) sweet potato- high fiber 13) beans, peas, and lentils- contain both soluble and insoluble fiber 14) chia seeds- improves intestinal health and gut flora 15) flaxseeds- laxative effect 16) whole grain rye bread- high in fiber 17) oat bran- high in soluble and insoluble fiber 18) kefir- softens stools -recommended to try at least one of these foods every day.  -drink 6-8 glasses of water per day -walk regularly, especially after meals.

## 2024-11-01 ENCOUNTER — Other Ambulatory Visit (HOSPITAL_COMMUNITY): Payer: Self-pay

## 2024-11-02 ENCOUNTER — Other Ambulatory Visit (HOSPITAL_COMMUNITY): Payer: Self-pay

## 2024-11-03 ENCOUNTER — Other Ambulatory Visit: Payer: Self-pay

## 2024-11-17 ENCOUNTER — Ambulatory Visit (HOSPITAL_COMMUNITY): Admitting: Clinical

## 2025-01-31 ENCOUNTER — Encounter: Admitting: Physical Medicine and Rehabilitation

## 2025-03-02 ENCOUNTER — Ambulatory Visit

## 2025-03-09 ENCOUNTER — Ambulatory Visit

## 2025-03-16 ENCOUNTER — Ambulatory Visit

## 2025-03-23 ENCOUNTER — Ambulatory Visit

## 2025-03-30 ENCOUNTER — Ambulatory Visit

## 2025-04-06 ENCOUNTER — Ambulatory Visit

## 2025-04-13 ENCOUNTER — Ambulatory Visit

## 2025-04-20 ENCOUNTER — Ambulatory Visit

## 2025-04-27 ENCOUNTER — Ambulatory Visit

## 2025-05-04 ENCOUNTER — Ambulatory Visit

## 2025-05-11 ENCOUNTER — Ambulatory Visit

## 2025-05-18 ENCOUNTER — Ambulatory Visit
